# Patient Record
Sex: Female | Born: 1950
Health system: Southern US, Community
[De-identification: ages and names within clinical notes are randomized; demographics above are authoritative.]

## PROBLEM LIST (undated history)

## (undated) DIAGNOSIS — I639 Cerebral infarction, unspecified: Secondary | ICD-10-CM

## (undated) DIAGNOSIS — N289 Disorder of kidney and ureter, unspecified: Secondary | ICD-10-CM

## (undated) DIAGNOSIS — F32A Depression, unspecified: Secondary | ICD-10-CM

## (undated) DIAGNOSIS — I10 Essential (primary) hypertension: Secondary | ICD-10-CM

## (undated) DIAGNOSIS — E785 Hyperlipidemia, unspecified: Secondary | ICD-10-CM

## (undated) DIAGNOSIS — E079 Disorder of thyroid, unspecified: Secondary | ICD-10-CM

## (undated) DIAGNOSIS — F329 Major depressive disorder, single episode, unspecified: Secondary | ICD-10-CM

## (undated) HISTORY — DX: Depression, unspecified: F32.A

## (undated) HISTORY — PX: KNEE ARTHROSCOPY W/ MENISCAL REPAIR: SHX1877

## (undated) HISTORY — DX: Disorder of kidney and ureter, unspecified: N28.9

## (undated) HISTORY — PX: CHOLECYSTECTOMY: SHX55

## (undated) HISTORY — DX: Essential (primary) hypertension: I10

## (undated) HISTORY — DX: Hyperlipidemia, unspecified: E78.5

## (undated) HISTORY — DX: Cerebral infarction, unspecified: I63.9

## (undated) HISTORY — DX: Major depressive disorder, single episode, unspecified: F32.9

## (undated) HISTORY — DX: Disorder of thyroid, unspecified: E07.9

---

## 1996-04-02 DIAGNOSIS — M1990 Unspecified osteoarthritis, unspecified site: Secondary | ICD-10-CM

## 1996-04-02 HISTORY — DX: Unspecified osteoarthritis, unspecified site: M19.90

## 2004-08-28 ENCOUNTER — Other Ambulatory Visit: Payer: Self-pay

## 2004-09-18 ENCOUNTER — Ambulatory Visit: Payer: Self-pay | Admitting: General Practice

## 2005-04-15 ENCOUNTER — Emergency Department: Payer: Self-pay | Admitting: Unknown Physician Specialty

## 2005-04-15 IMAGING — CR CERVICAL SPINE - 2-3 VIEW
1 series · 4 of 4 positions shown · non-contrast
Comparison: none

REASON FOR EXAM: fall / [HOSPITAL]
COMMENTS:  LMP: Post-Menopausal

[Series 1: view not recorded · 0.17mm/px · 4 of 4 slices shown]
[im 1/4]
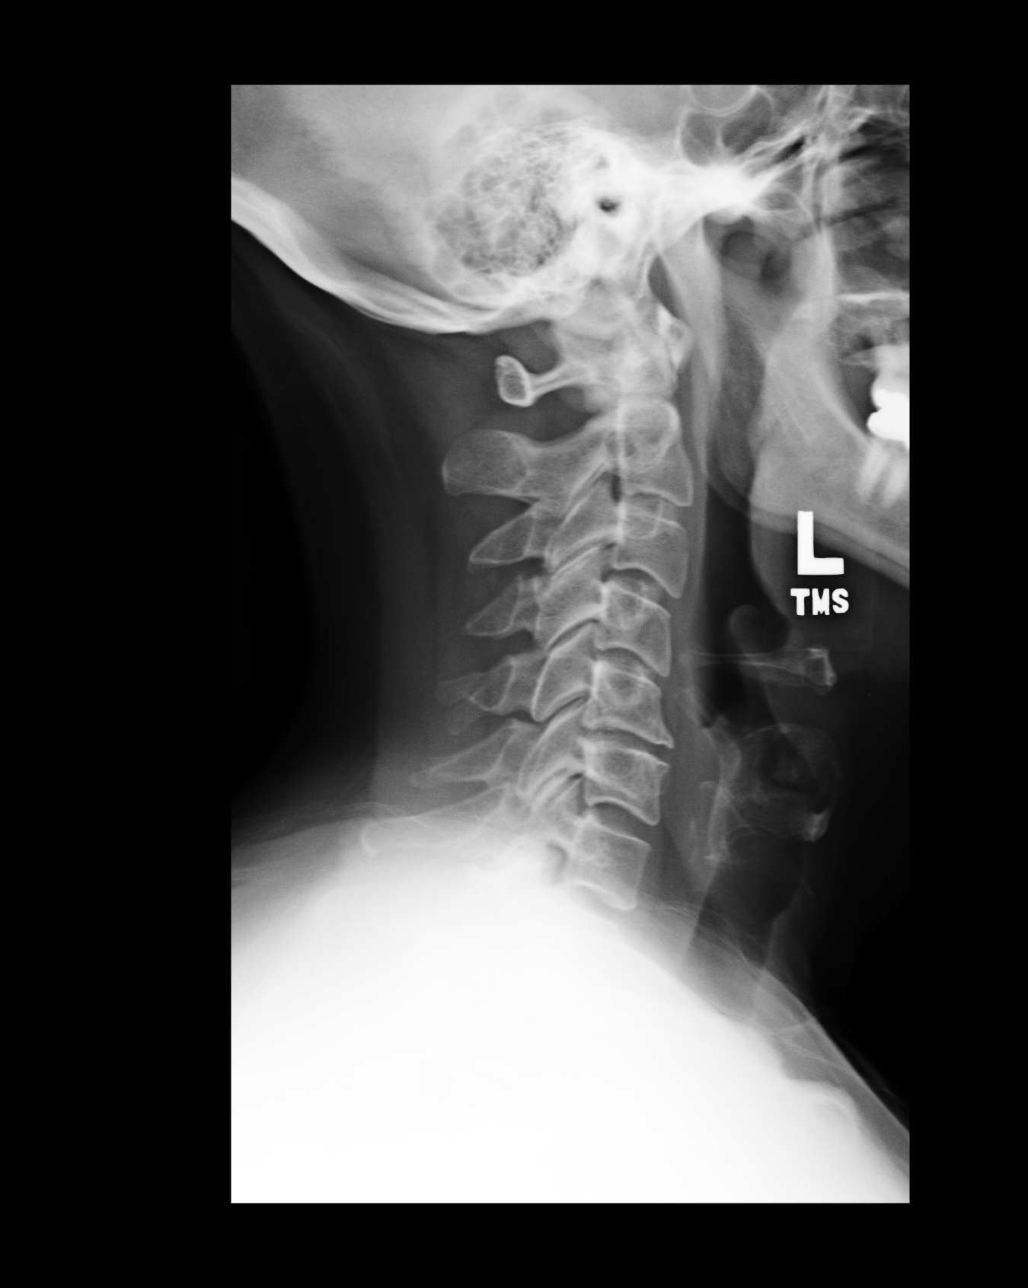
[im 2/4]
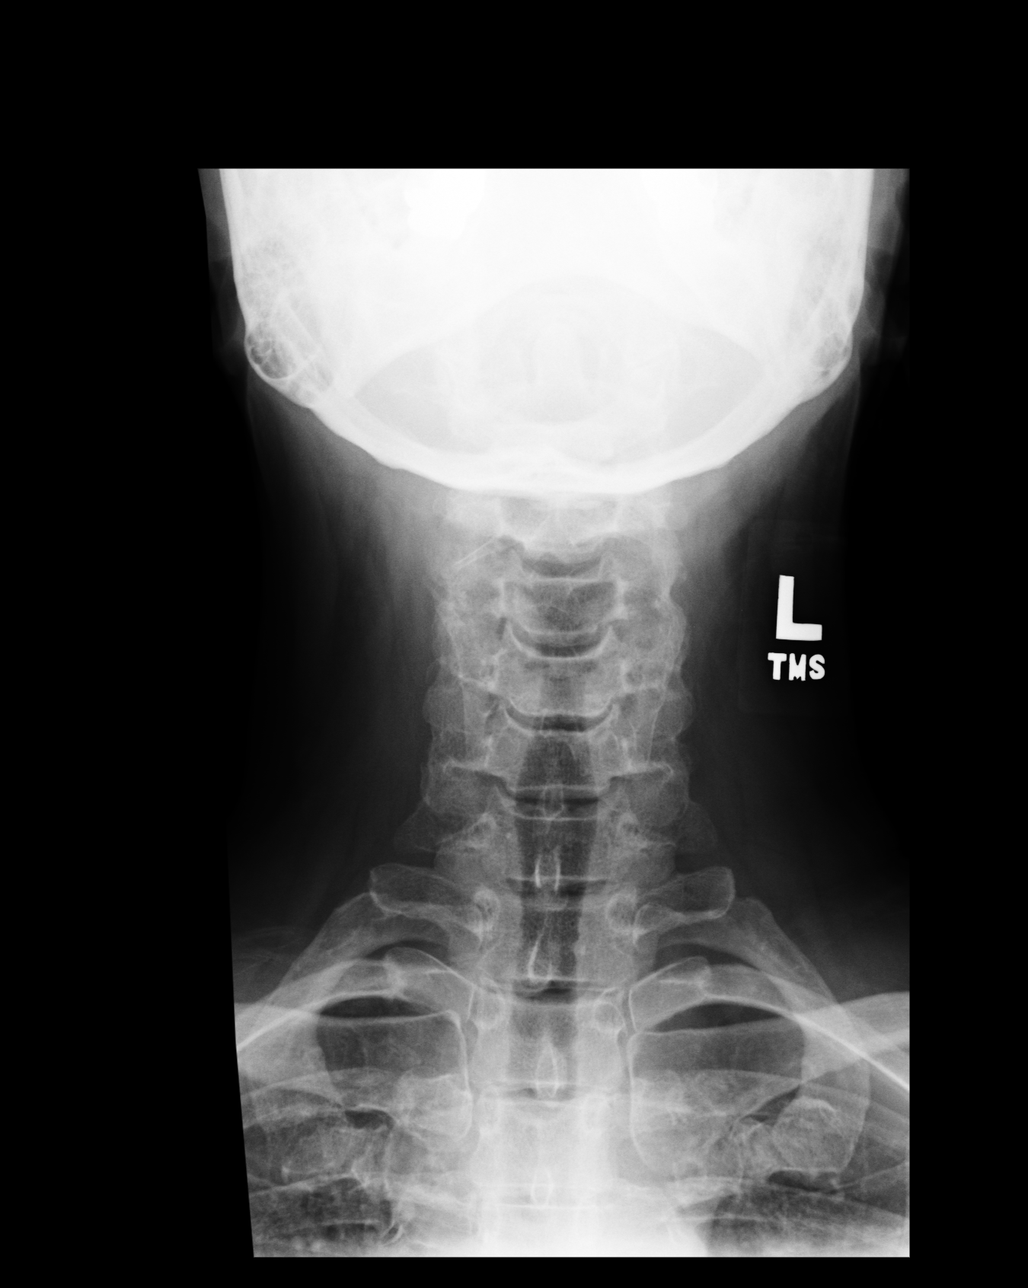
[im 3/4]
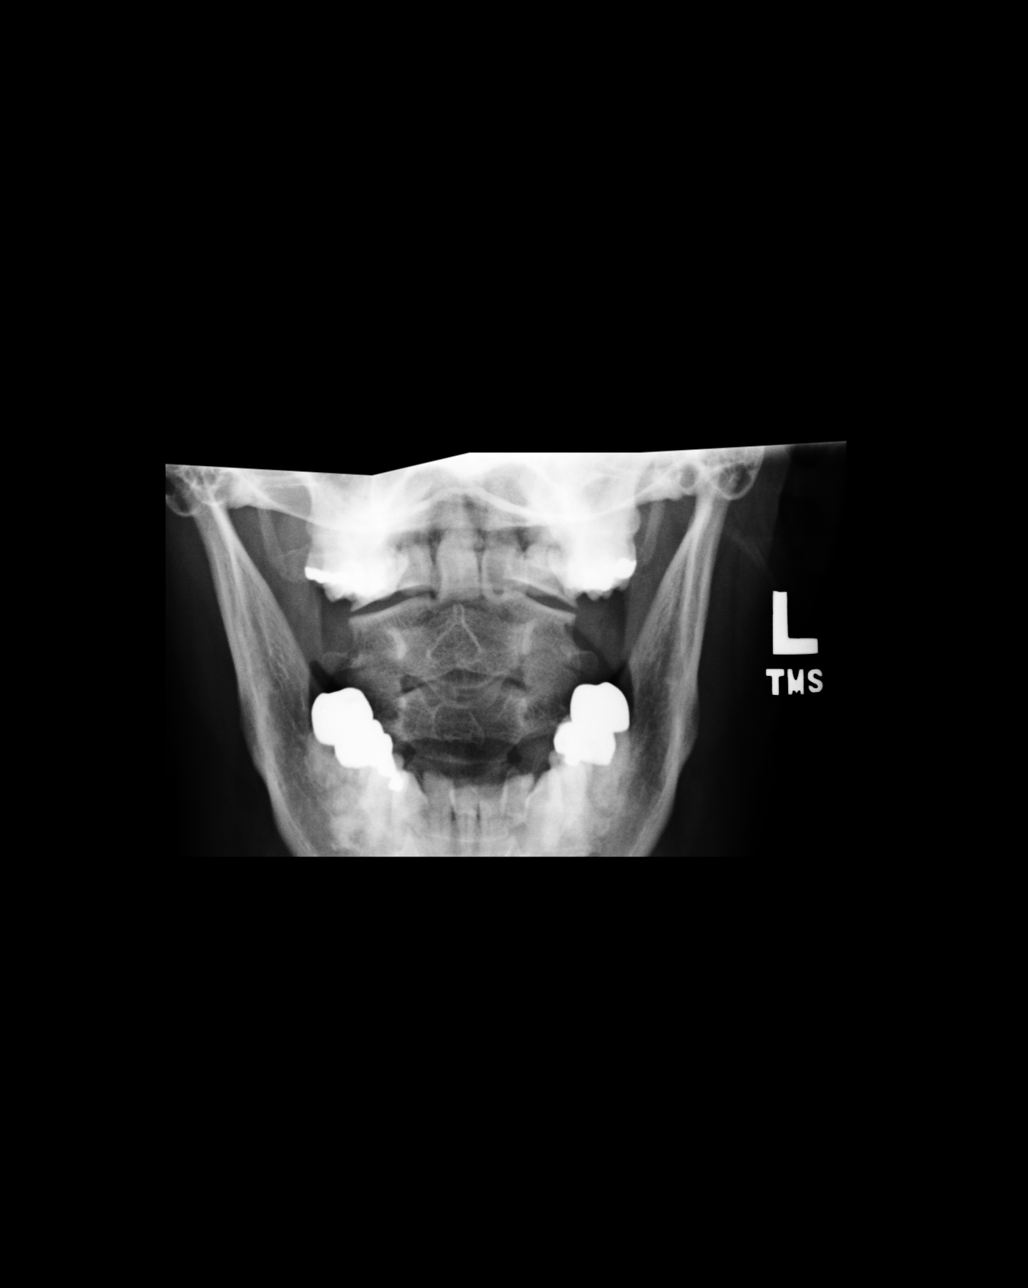
[im 4/4]
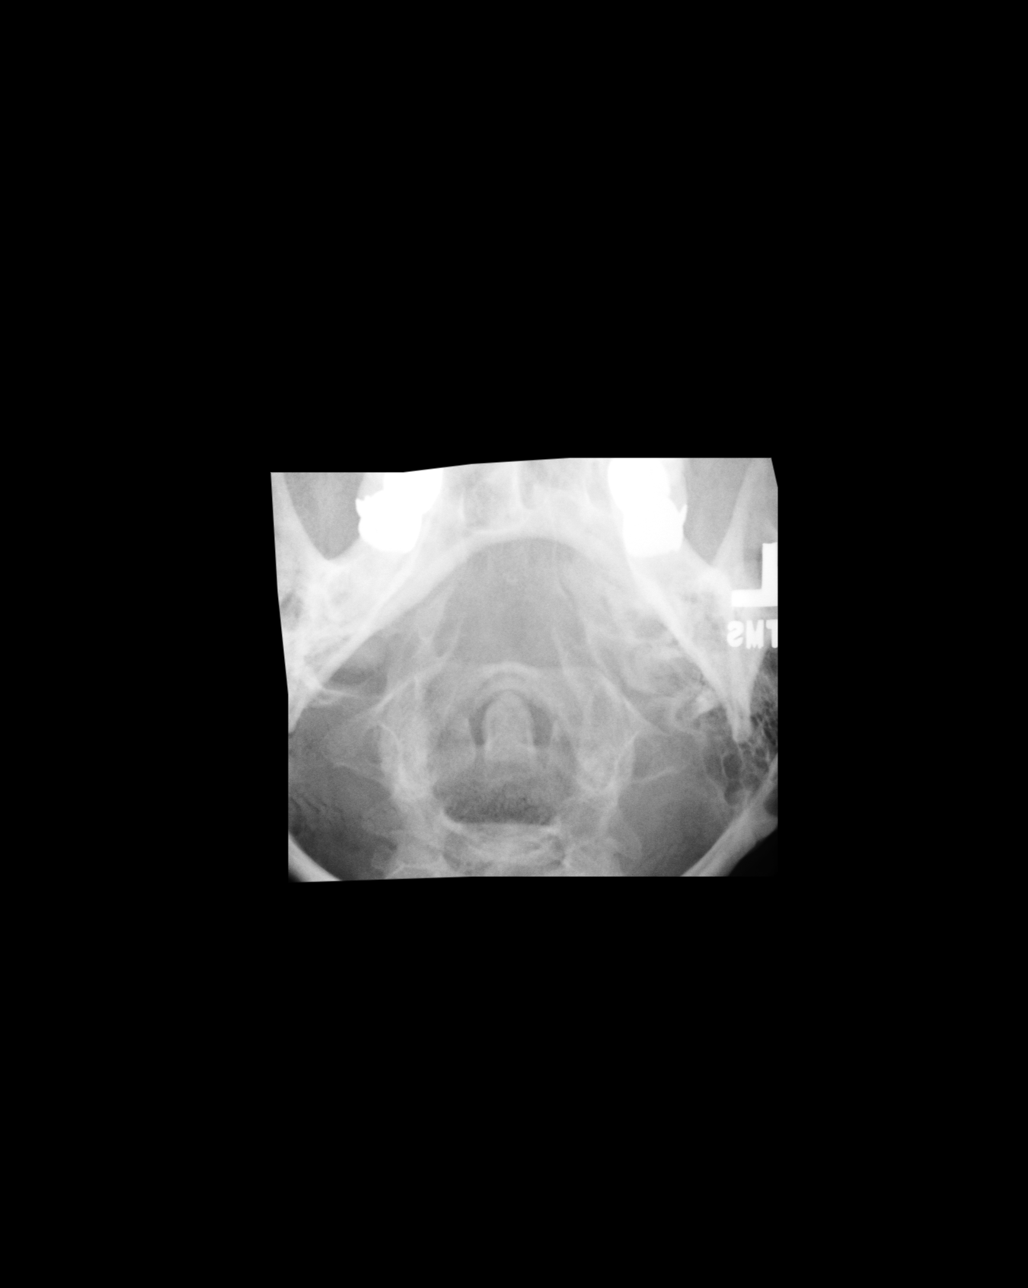

[4 of 4 positions shown; findings below may reference images not displayed]

PROCEDURE:     DXR - DXR C- SPINE AP AND LATERAL  - [DATE] [DATE]

RESULT:     AP, lateral and odontoid views of the cervical spine show the
vertebral body heights to be well maintained. No fracture is seen. The
vertebral body alignment is normal. There is narrowing of C5-6 compatible
with cervical disc disease. Associated anterior spur formation is present at
this level. The odontoid process is intact.
IMPRESSION: 1)No fracture is seen.

2)There are noted changes of cervical disc disease at C5-C6.

## 2005-04-15 IMAGING — CT CT HEAD WITHOUT CONTRAST
2 series · 16 of 30 positions shown, 20 images · non-contrast
Comparison: none

REASON FOR EXAM: trauma to head / [HOSPITAL]
COMMENTS:  LMP: Post-Menopausal

[Series 2: without · axial · non-contrast · 0.41mm/px · z∈[-134,-14]mm · 13 of 30 slices shown, 17 images]
[im 3/30  brain]
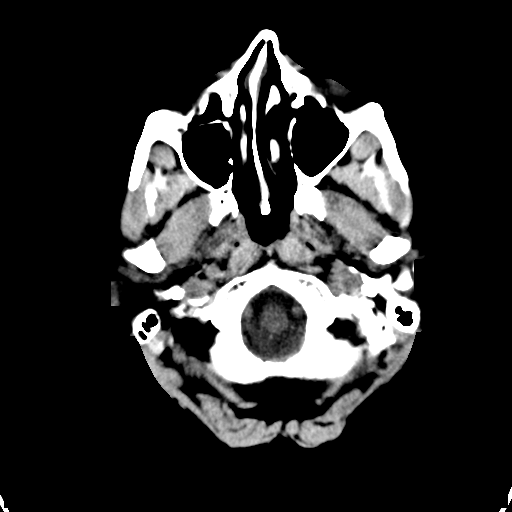
[im 3/30  bone]
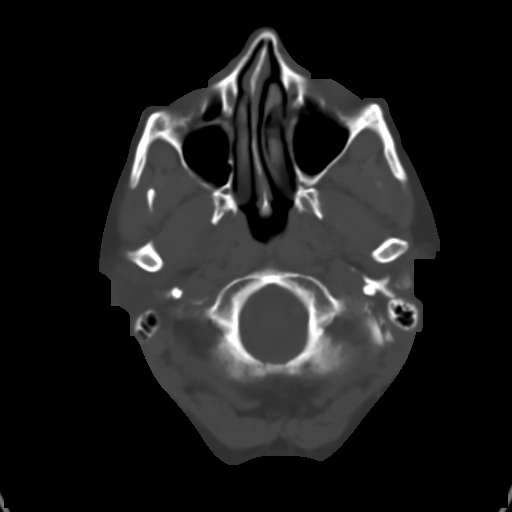
[im 5/30  brain]
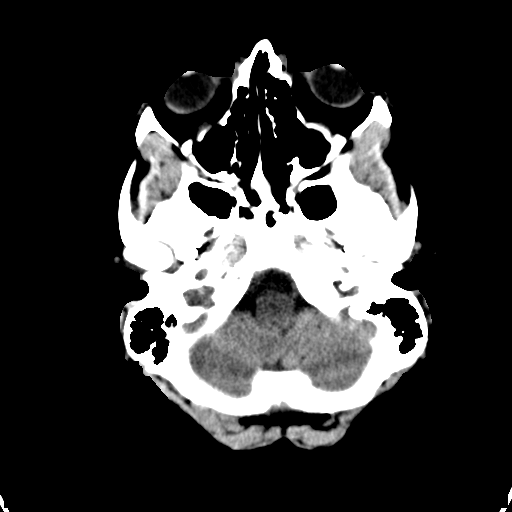
[im 7/30  brain]
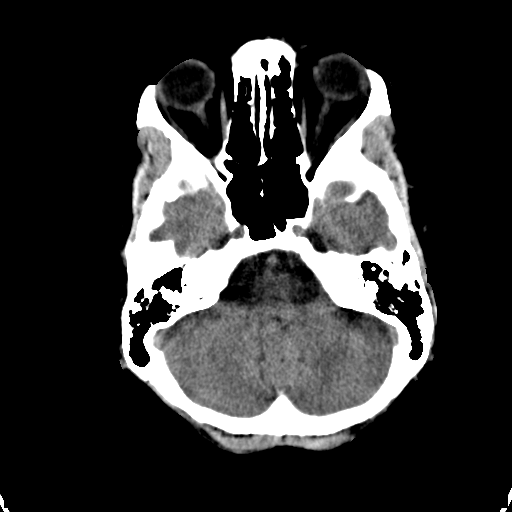
[im 9/30  brain]
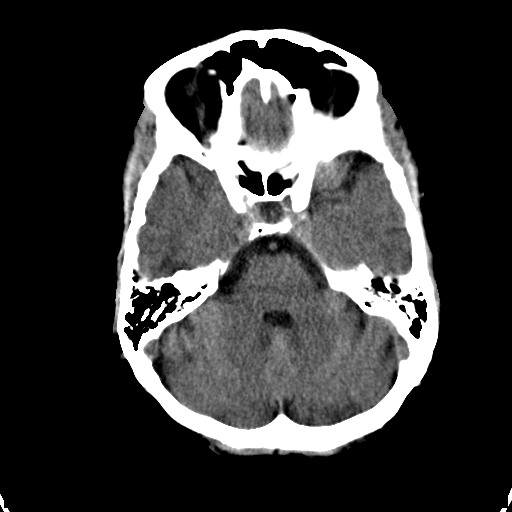
[im 11/30  brain]
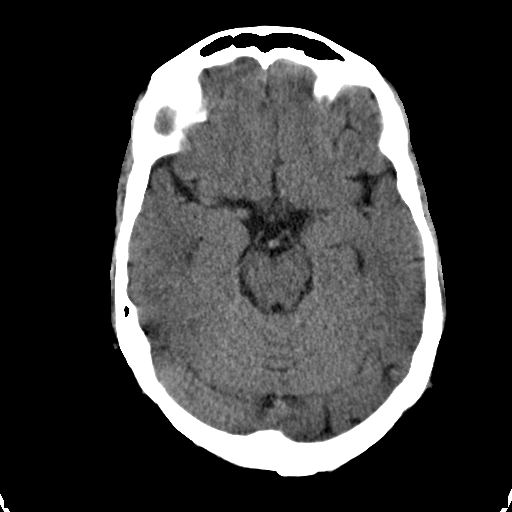
[im 11/30  bone]
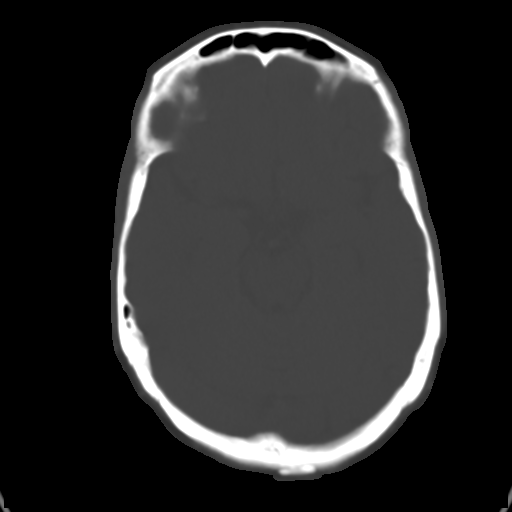
[im 13/30  brain]
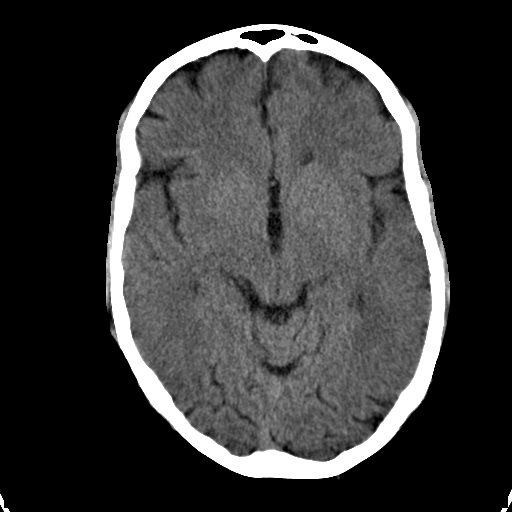
[im 15/30  brain]
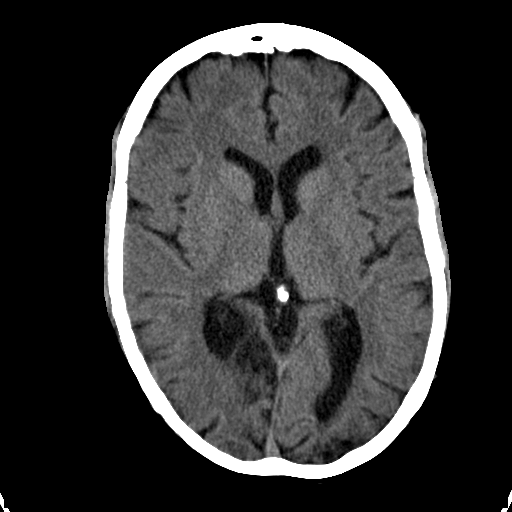
[im 17/30  brain]
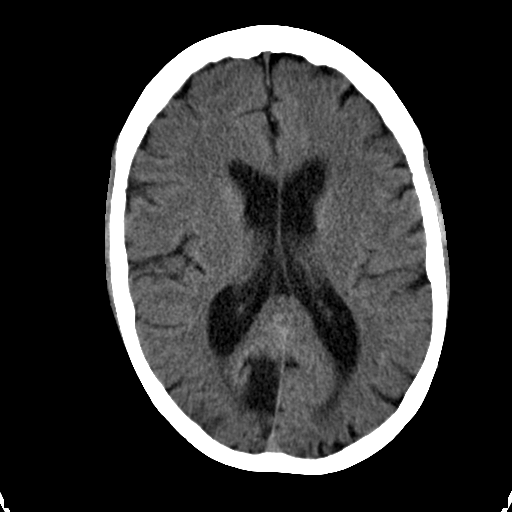
[im 19/30  brain]
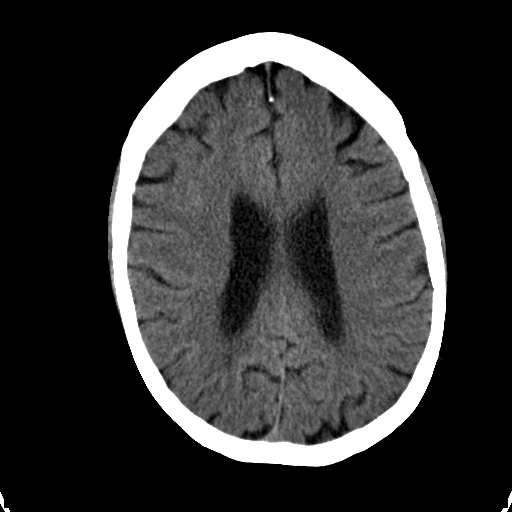
[im 19/30  bone]
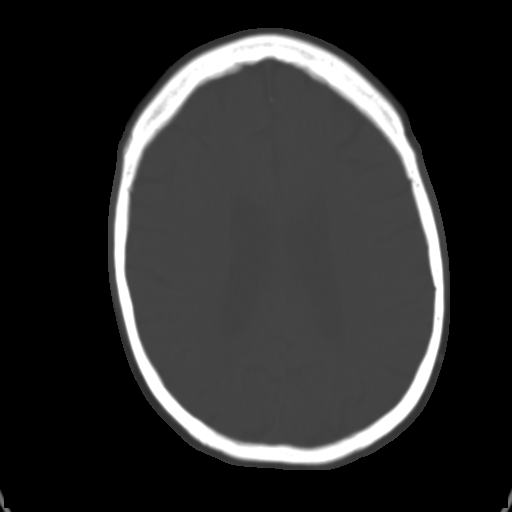
[im 21/30  brain]
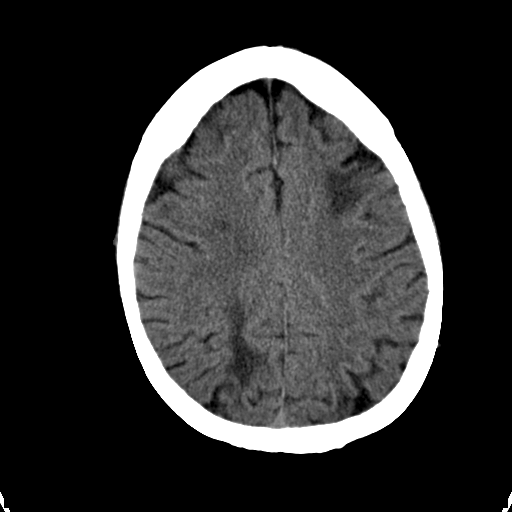
[im 23/30  brain]
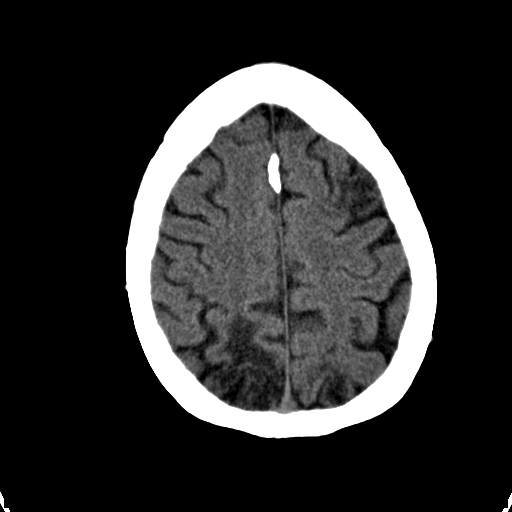
[im 25/30  brain]
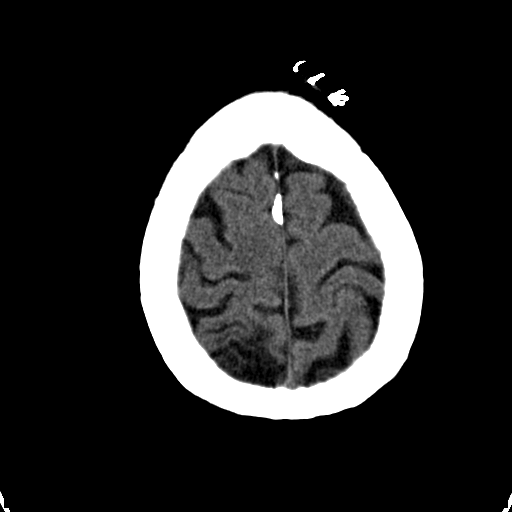
[im 27/30  brain]
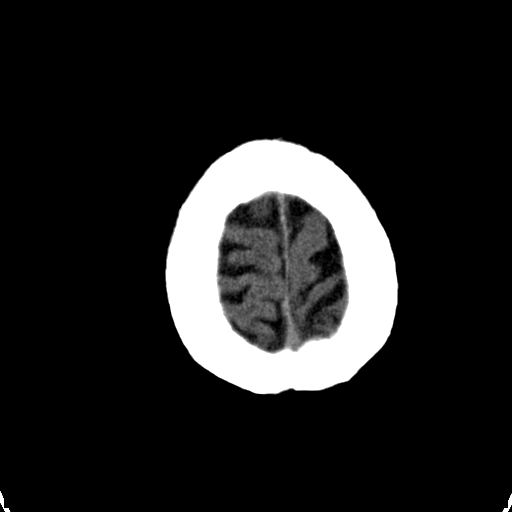
[im 27/30  bone]
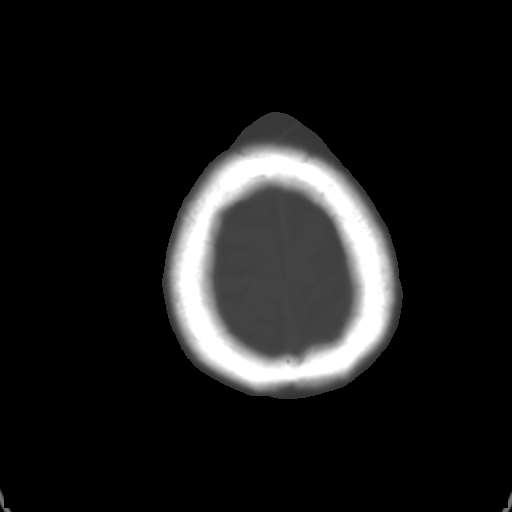

[Series 3: bone windows · axial · 0.41mm/px · z∈[-134,-94]mm · 3 of 30 slices shown]
[im 3/30  bone]
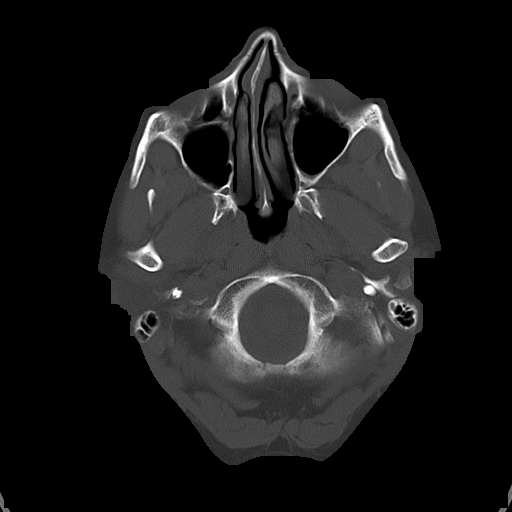
[im 7/30  bone]
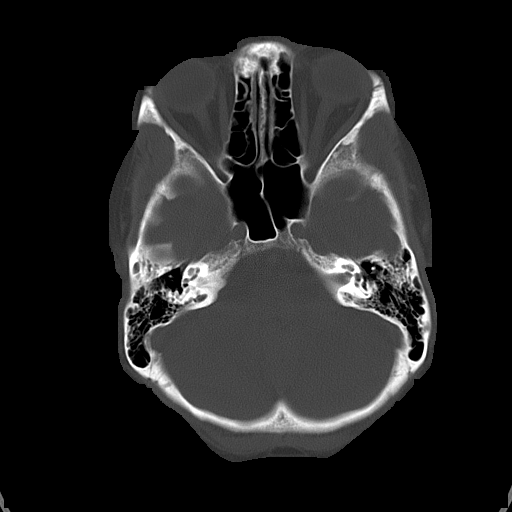
[im 11/30  bone]
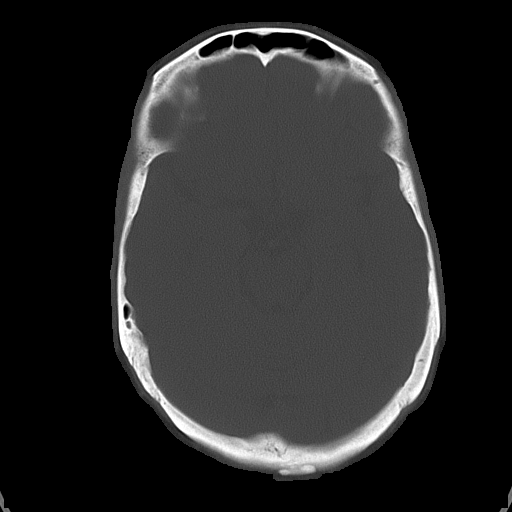

[16 of 30 positions shown; findings below may reference images not displayed]

PROCEDURE:     CT  - CT HEAD WITHOUT CONTRAST  - [DATE] [DATE]

RESULT:     Unenhanced CT scan of the Brain was performed. No evidence of
acute intracranial hemorrhage is identified. There is no midline shift or
mass effect. There is evidence of an old RIGHT occipital lobe infarct with
encephalomalacic change. There is no evidence of skull fracture or paranasal
sinus abnormality. There is evidence of a LEFT frontal scalp laceration.
IMPRESSION: 1)Old RIGHT occipital lobe infarct with encephalomalacic change.

2)No evidence of acute hemorrhage, midline shift, or mass effect.

3)No skull fracture but there is a LEFT frontal scalp laceration.

## 2007-03-06 ENCOUNTER — Ambulatory Visit: Payer: Self-pay | Admitting: Gastroenterology

## 2007-03-23 ENCOUNTER — Emergency Department: Payer: Self-pay | Admitting: Emergency Medicine

## 2007-03-23 IMAGING — CR LEFT WRIST - COMPLETE 3+ VIEW
1 series · 4 of 4 positions shown · non-contrast
Comparison: none

REASON FOR EXAM: fell c/o left wrist min 4
COMMENTS:

PROCEDURE:     DXR - DXR WRIST LT COMP WITH OBLIQUES  - [DATE]  [DATE]
RESULT:     Four views of the LEFT wrist demonstrate linear lucency in the
distal radius consistent with a non-displaced fracture. The carpus appears
intact.

[Series 1: view not recorded · 0.17mm/px · 4 of 4 slices shown]
[im 1/4]
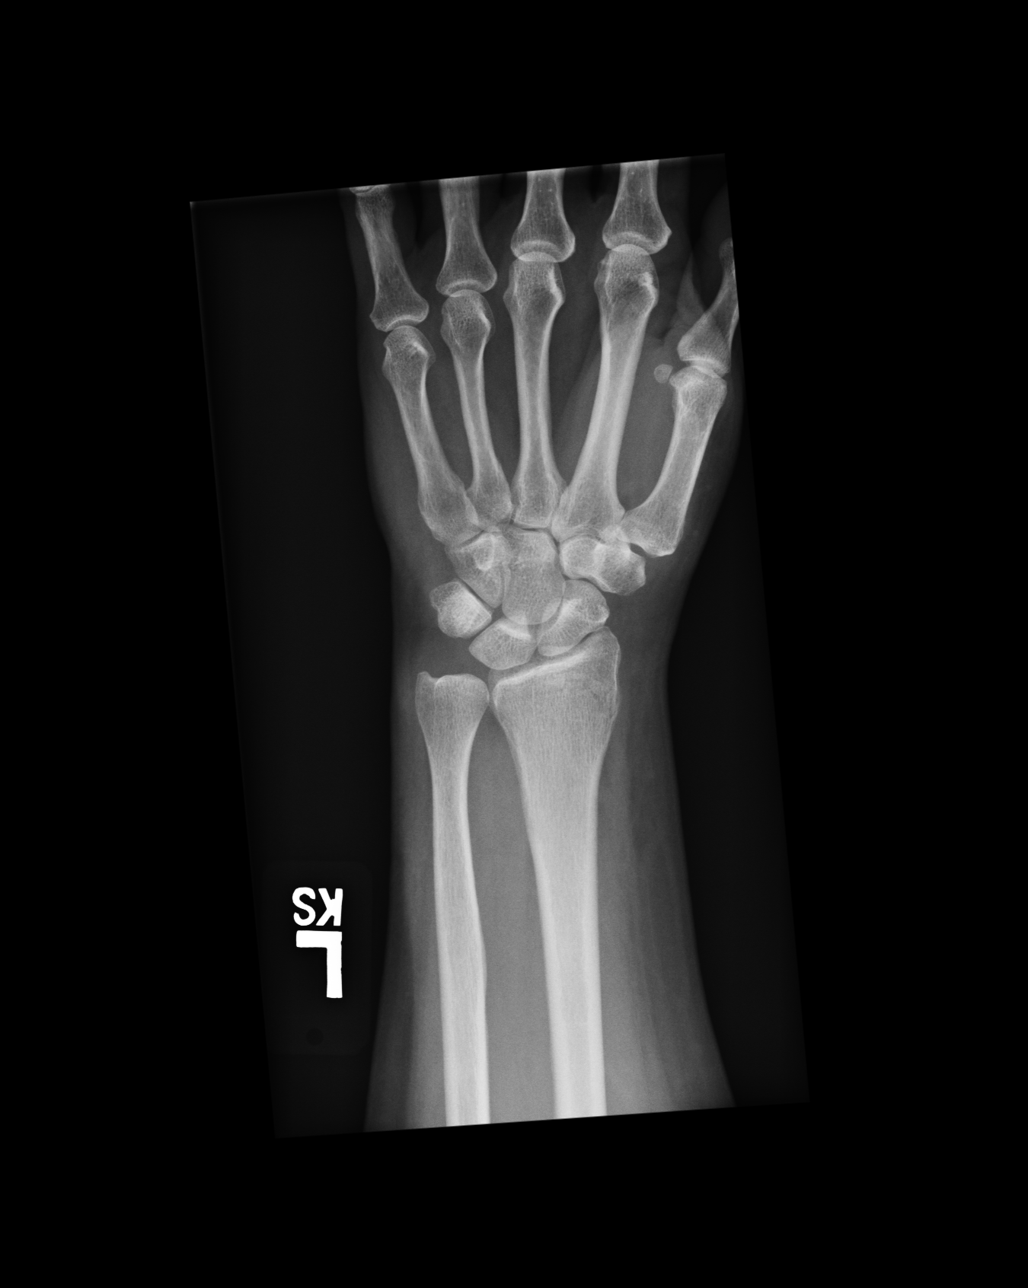
[im 2/4]
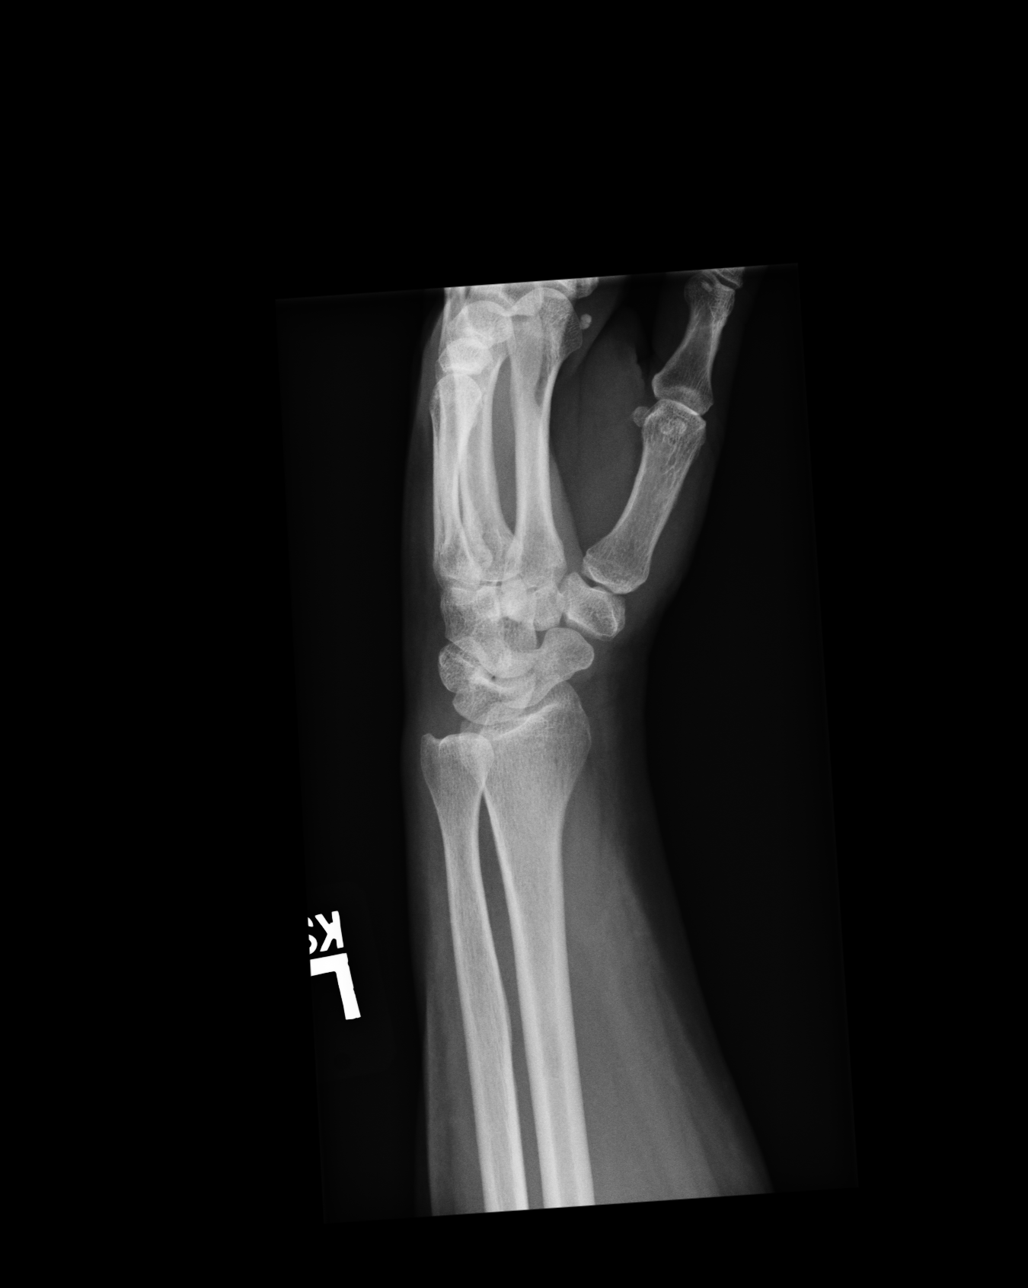
[im 3/4]
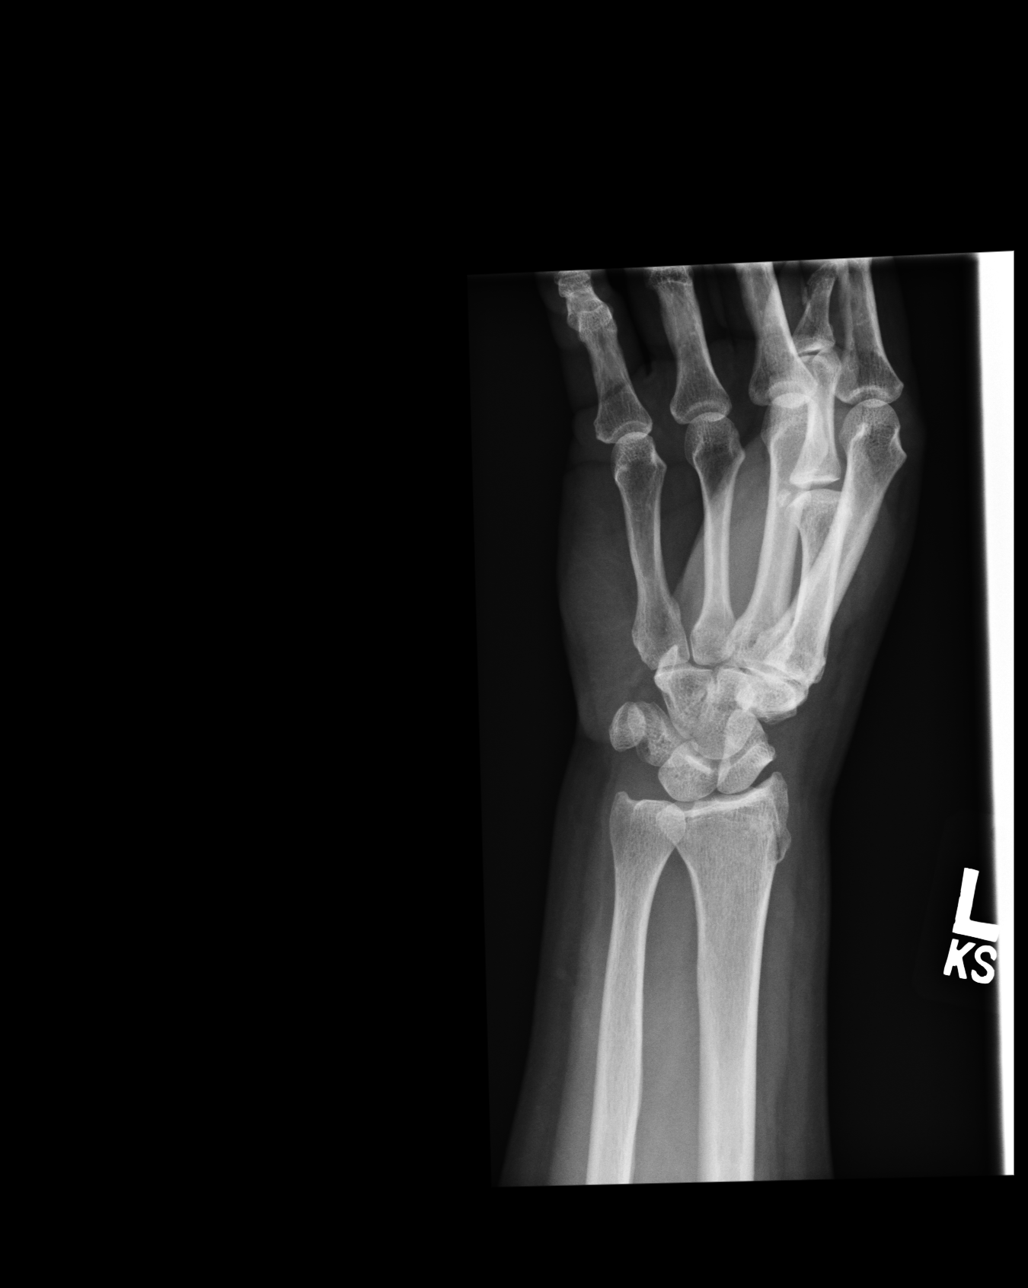
[im 4/4]
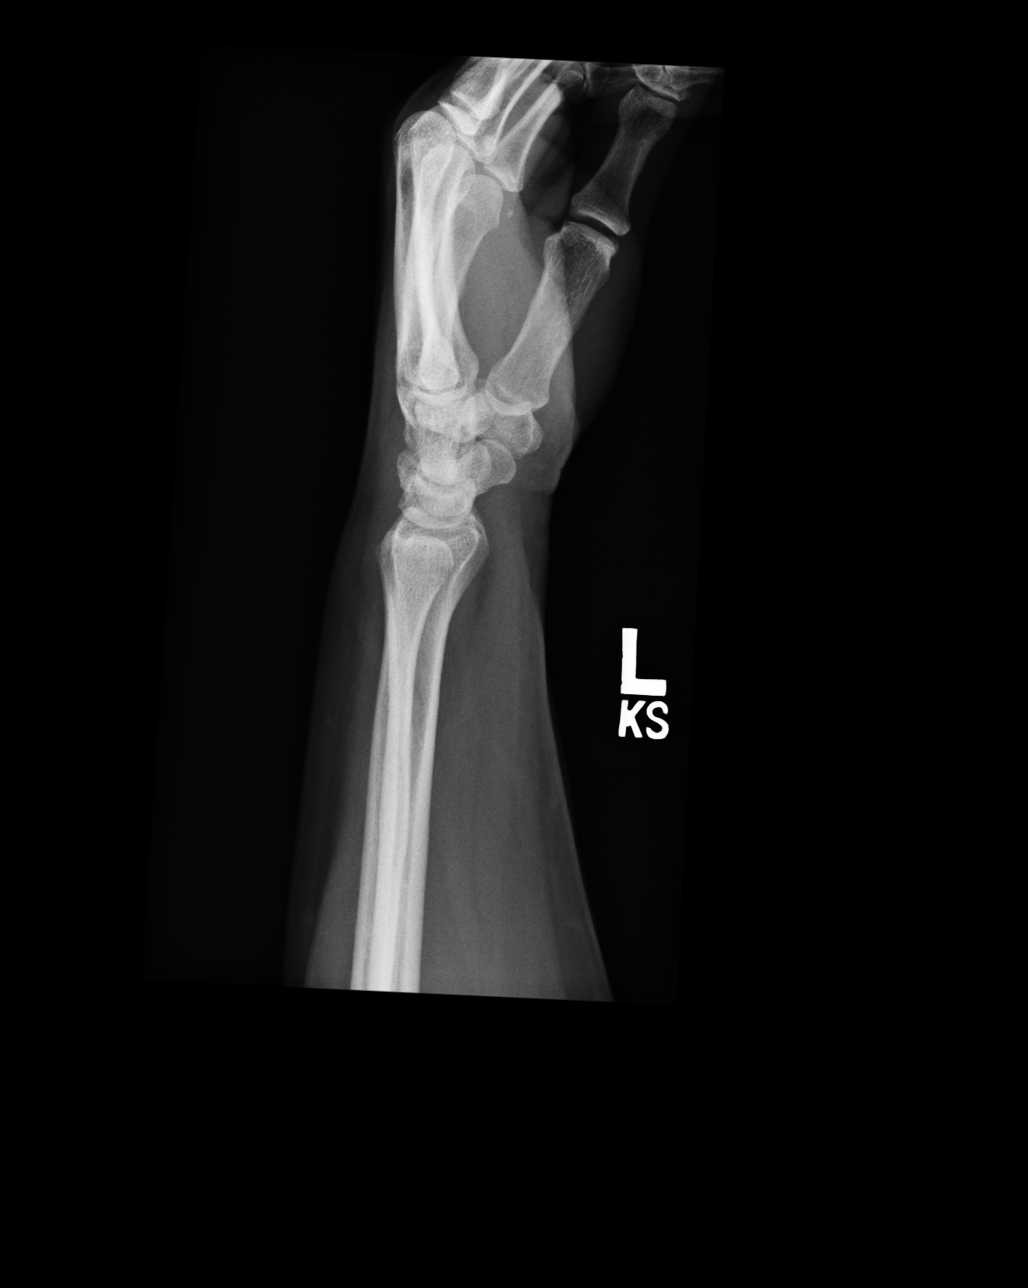

[4 of 4 positions shown; findings below may reference images not displayed]

IMPRESSION: 1.Non-displaced fracture in the distal LEFT radius.

## 2007-07-17 ENCOUNTER — Ambulatory Visit: Payer: Self-pay

## 2008-11-22 ENCOUNTER — Ambulatory Visit: Payer: Self-pay

## 2008-11-24 ENCOUNTER — Ambulatory Visit: Payer: Self-pay

## 2010-03-06 ENCOUNTER — Ambulatory Visit: Payer: Self-pay

## 2010-07-18 ENCOUNTER — Emergency Department: Payer: Self-pay | Admitting: Unknown Physician Specialty

## 2010-07-18 IMAGING — CR RIGHT FOOT COMPLETE - 3+ VIEW
1 series · 3 of 3 positions shown · non-contrast
Comparison: none

REASON FOR EXAM: injury -- ed waiting room
COMMENTS:

PROCEDURE:     DXR - DXR FOOT RT COMPLETE W/OBLIQUES  - [DATE] [DATE]
RESULT:     There is a minimally displaced comminuted fracture at the base
of the fifth metatarsal. No other fractures are seen.  In the lateral view,
there is incidentally noted a plantar calcaneal spur.

[Series 1: view not recorded · 0.17mm/px · 3 of 3 slices shown]
[im 1/3]
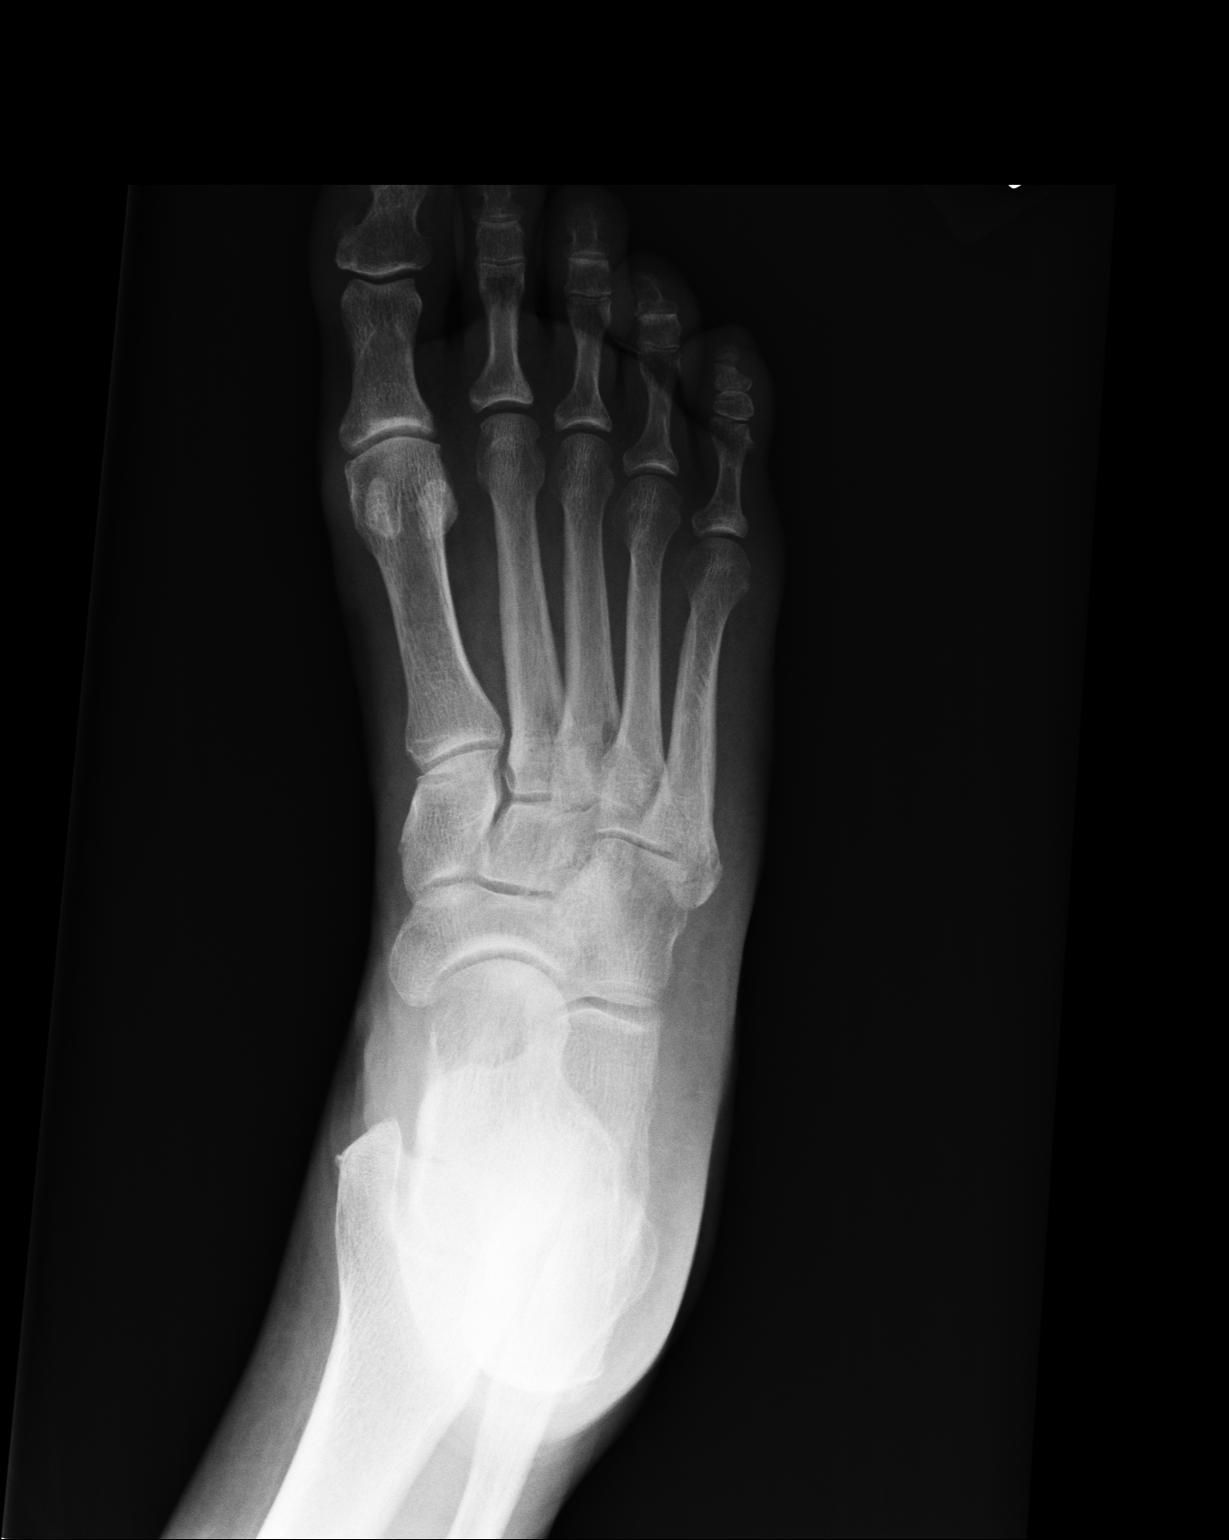
[im 2/3]
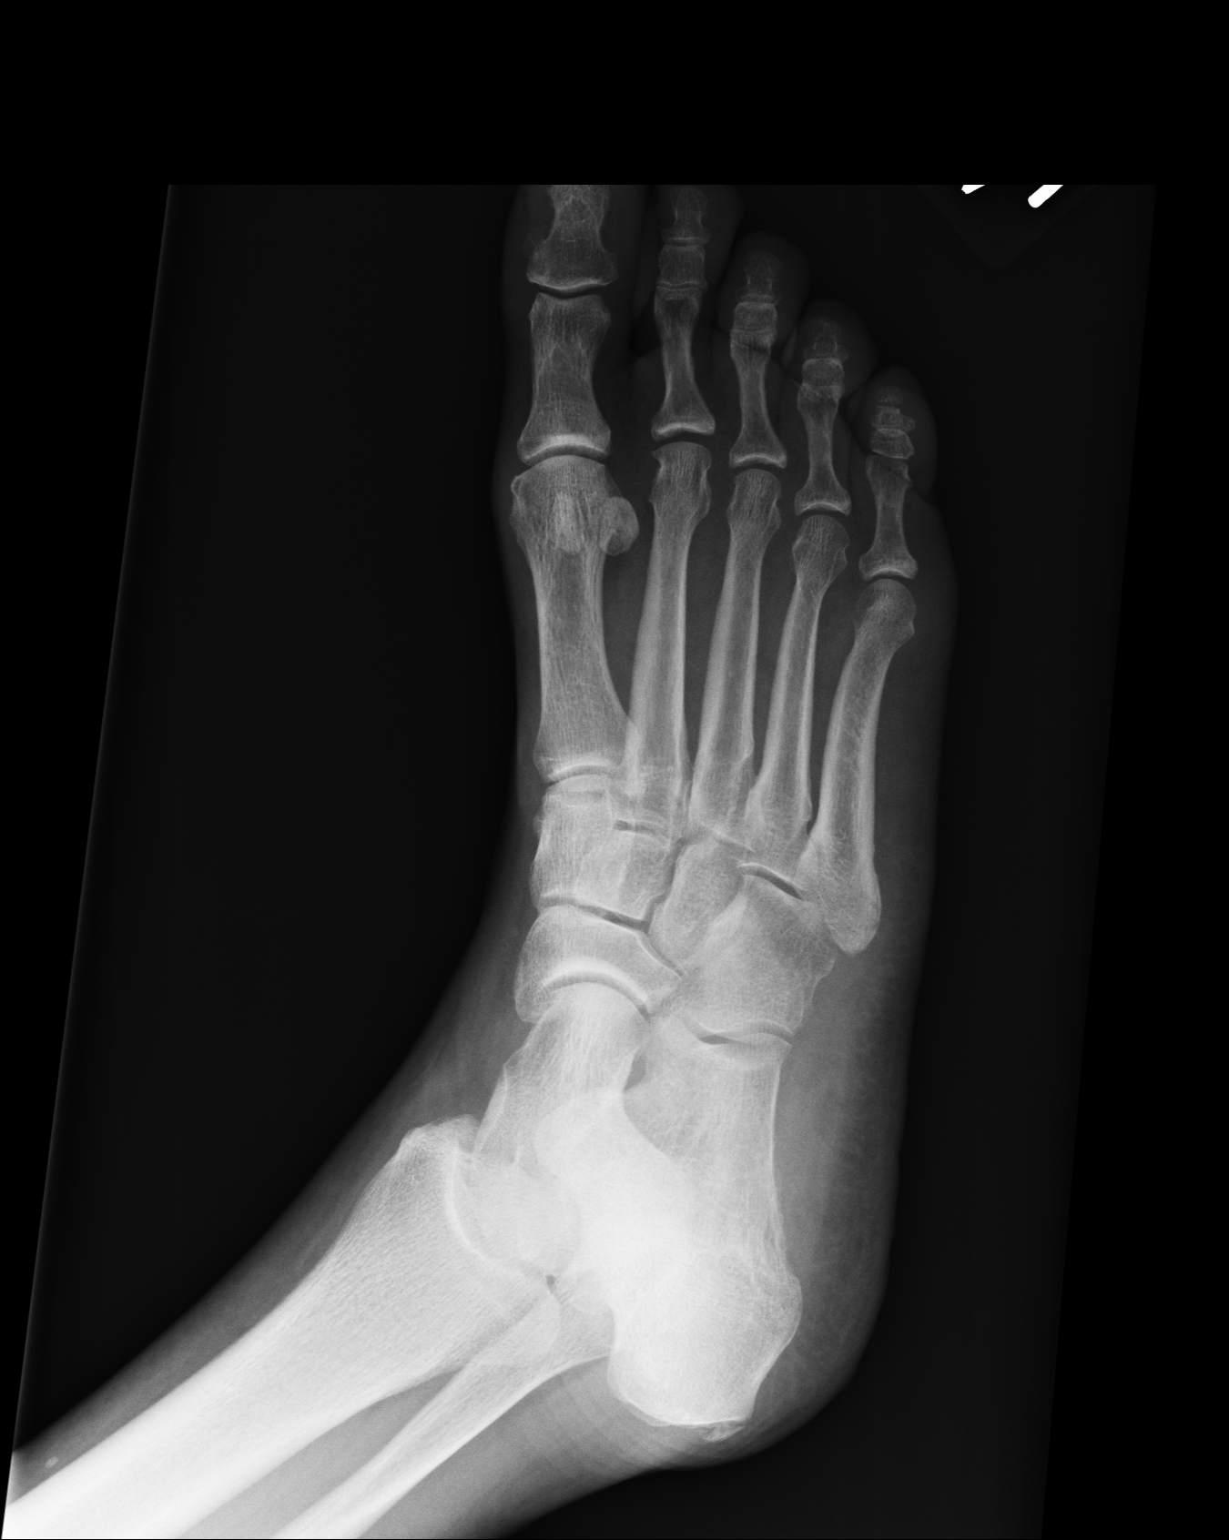
[im 3/3]
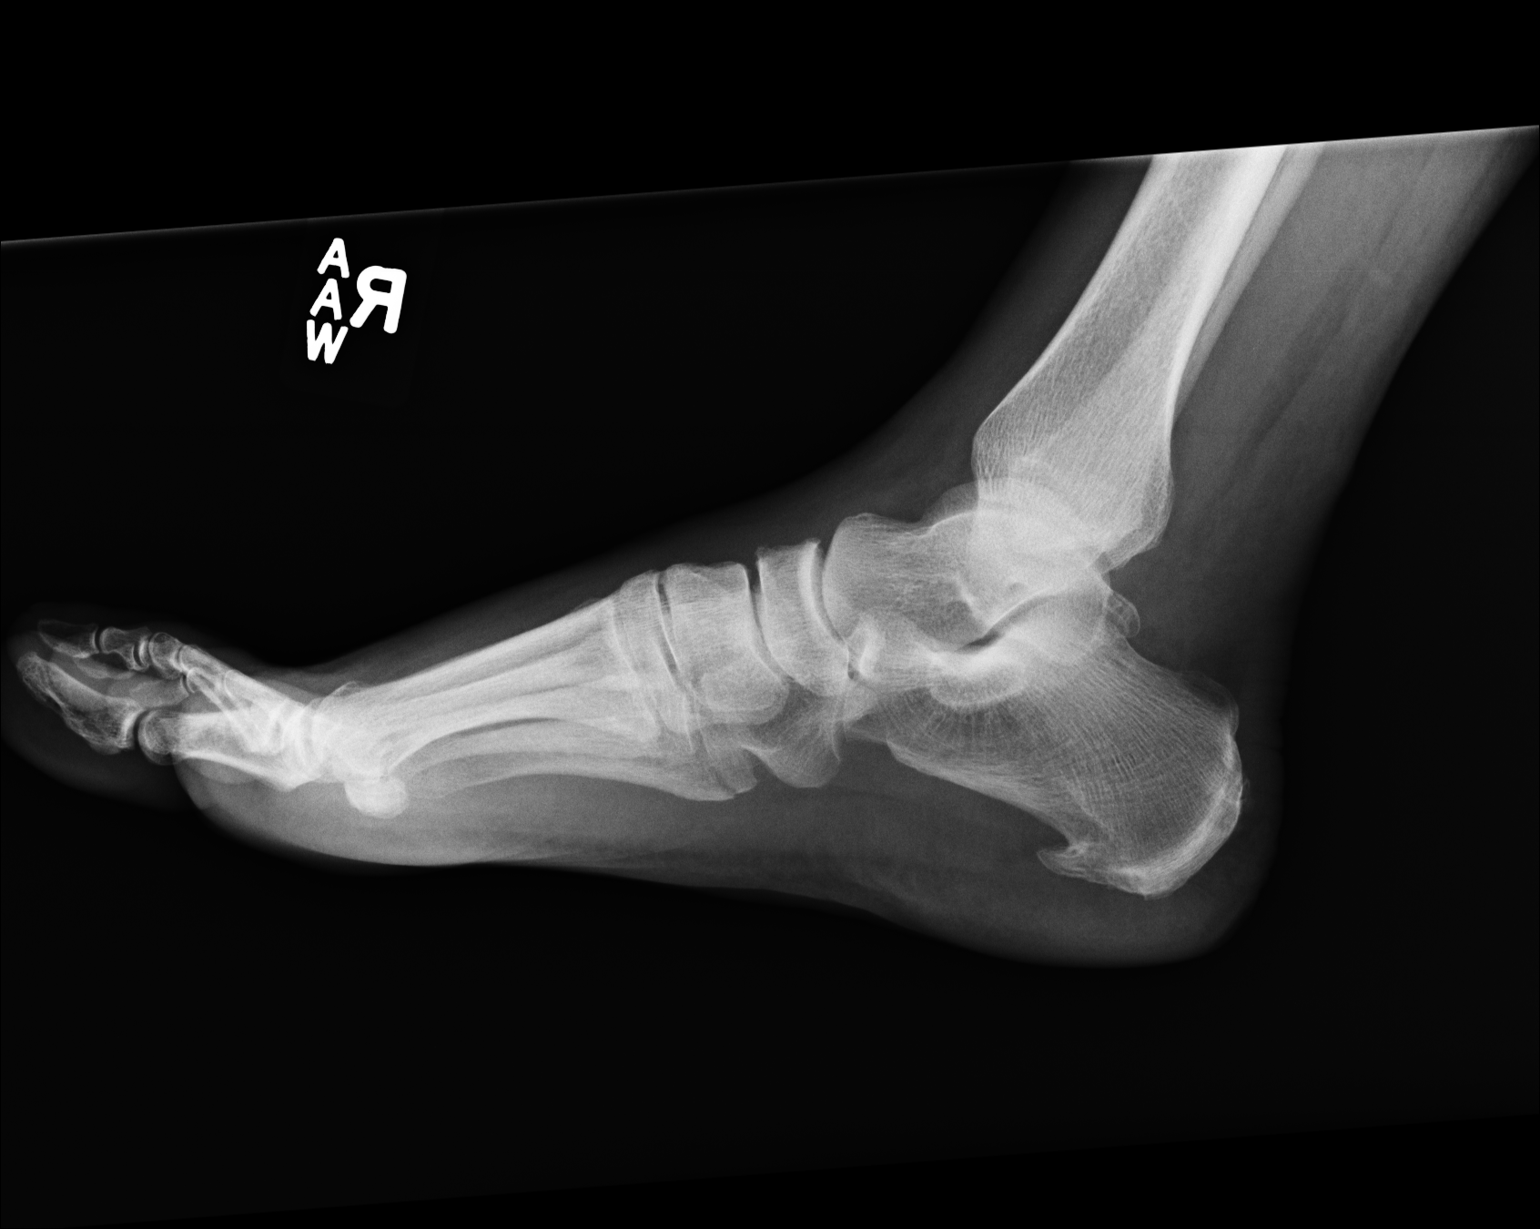

[3 of 3 positions shown; findings below may reference images not displayed]

IMPRESSION: 1. Fracture at the base of the fifth metatarsal, minimally displaced.
2. A plantar calcaneal spur is noted.

## 2010-12-03 HISTORY — PX: COLONOSCOPY: SHX174

## 2010-12-17 ENCOUNTER — Emergency Department: Payer: Self-pay | Admitting: Emergency Medicine

## 2010-12-17 IMAGING — CR LEFT WRIST - COMPLETE 3+ VIEW
1 series · 4 of 4 positions shown · non-contrast
Comparison: none

REASON FOR EXAM: pain
COMMENTS:

[Series 1: view not recorded · 0.17mm/px · 4 of 4 slices shown]
[im 1/4]
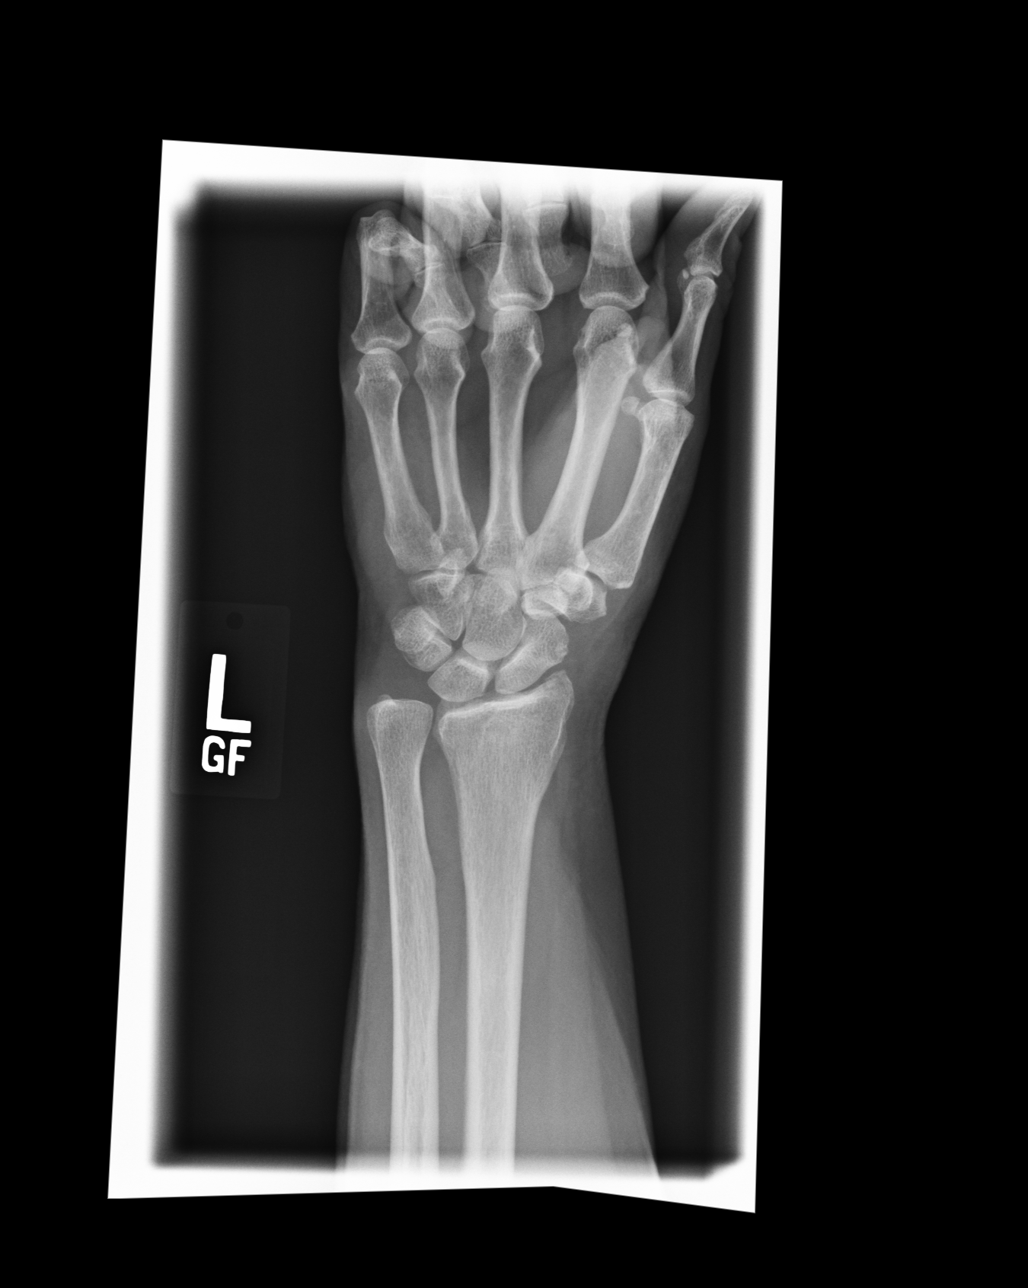
[im 2/4]
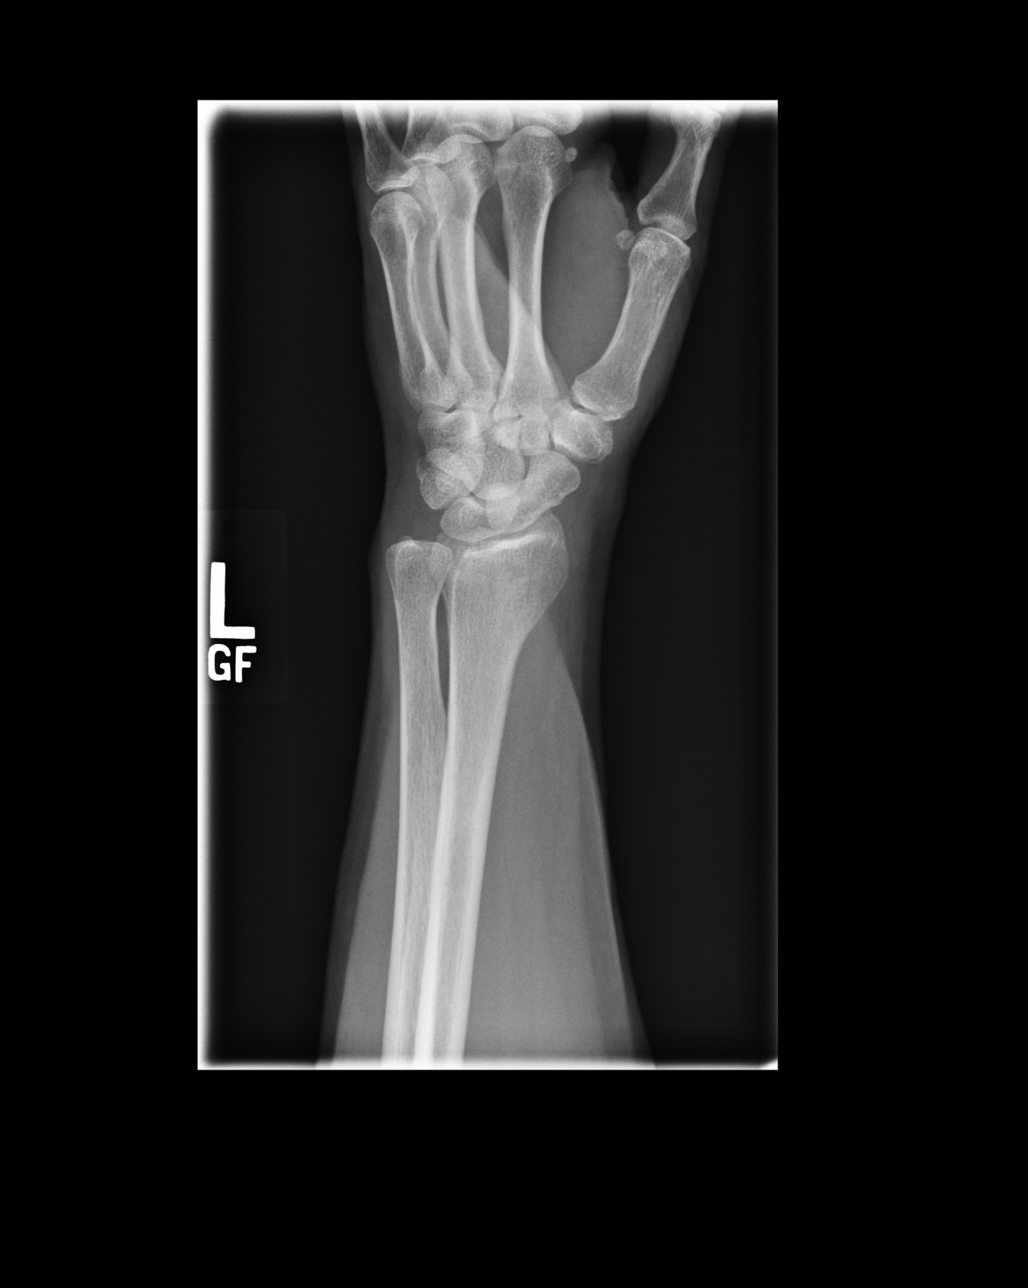
[im 3/4]
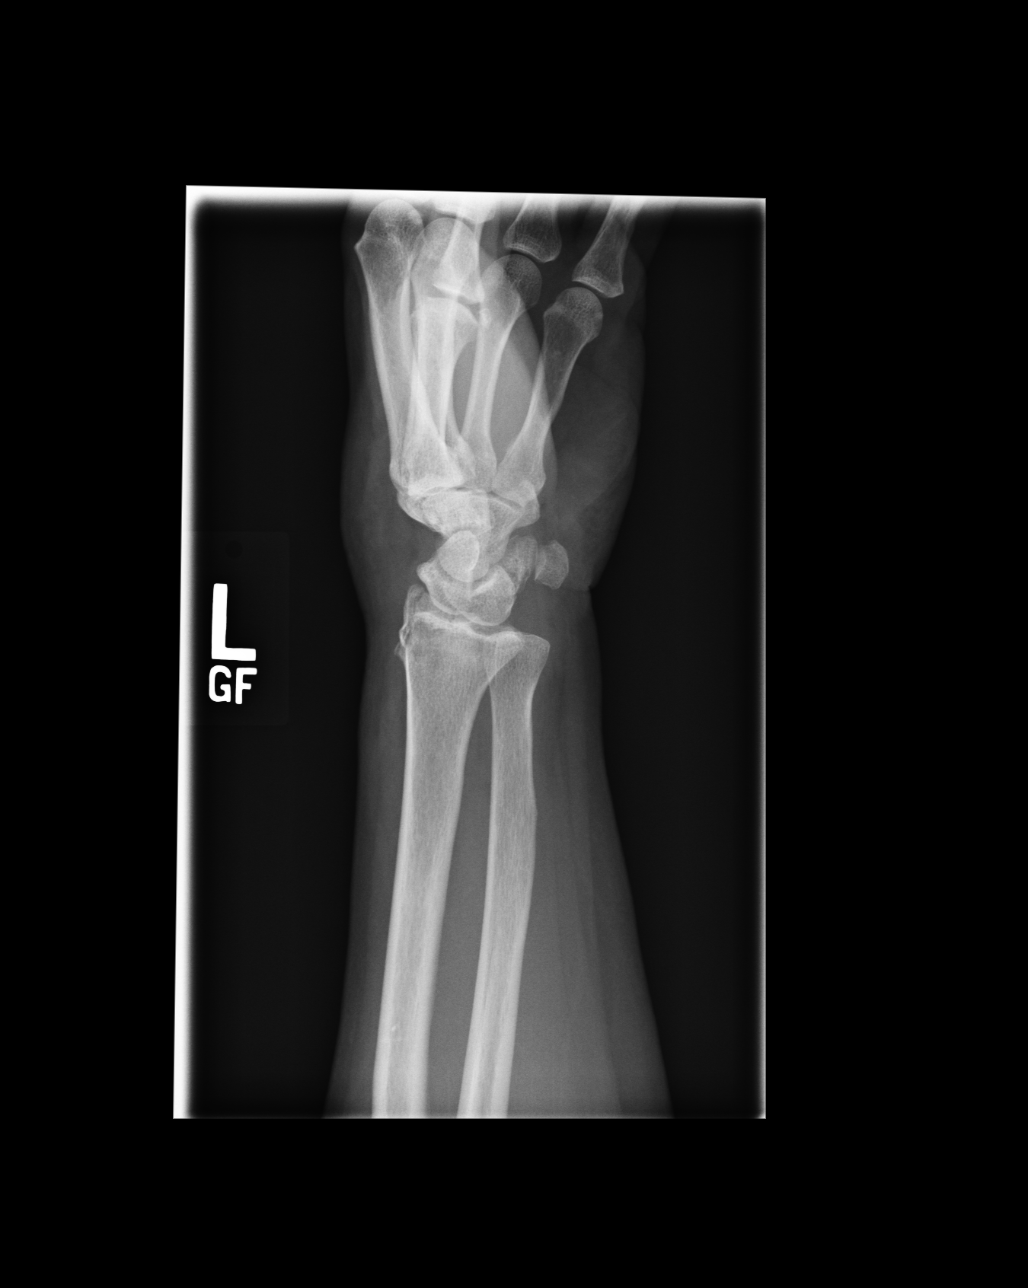
[im 4/4]
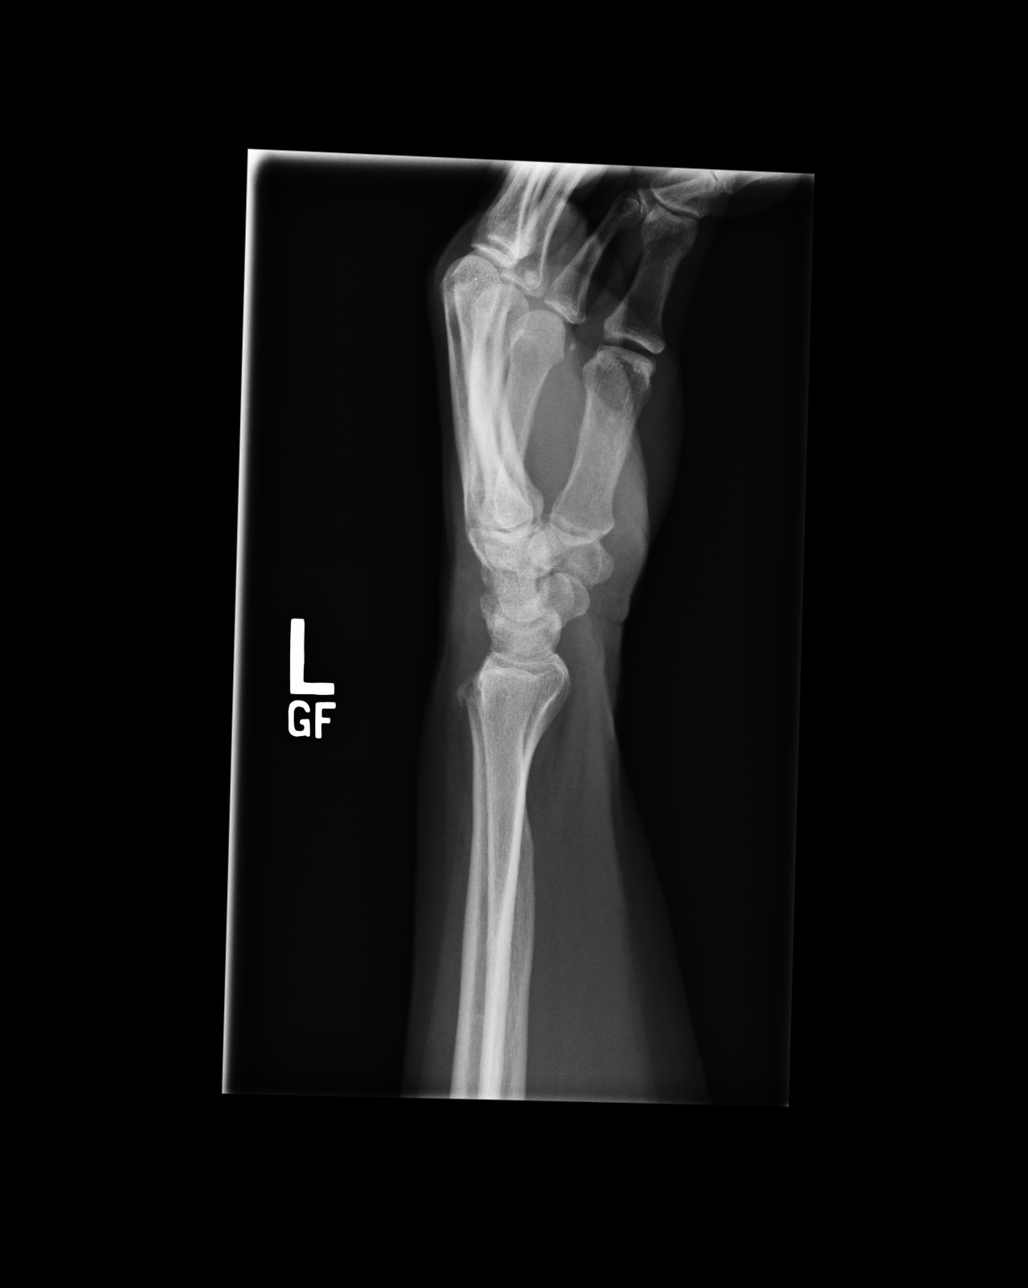

[4 of 4 positions shown; findings below may reference images not displayed]

PROCEDURE:     DXR - DXR WRIST LT COMP WITH OBLIQUES  - [DATE]  [DATE]

RESULT:     Four views of the left wrist are submitted. The patient has a
history of prior fracture of the distal radius. There is a nondisplaced
acute refracture involving the lateral aspect of the radius in the region of
the styloid. The adjacent ulna is grossly intact. I see no acute carpal bone
fracture.
IMPRESSION: The findings are consistent with an acute fracture of the
lateral aspect of the distal radius in the region of the styloid. No
definite carpal bone fracture is identified. The metacarpals are intact
where visualized.

## 2011-03-27 IMAGING — CR DG RIBS 2V*L*
1 series · 3 of 3 positions shown · non-contrast
Comparison: none

REASON FOR EXAM: injury  -  ed waiting room
COMMENTS:

PROCEDURE:     DXR - DXR RIBS LEFT UNILATERAL  - [DATE] [DATE]
RESULT:     No fracture or other acute bony abnormality is identified.

[Series 1: view not recorded · 0.17mm/px · 3 of 3 slices shown]
[im 1/3]
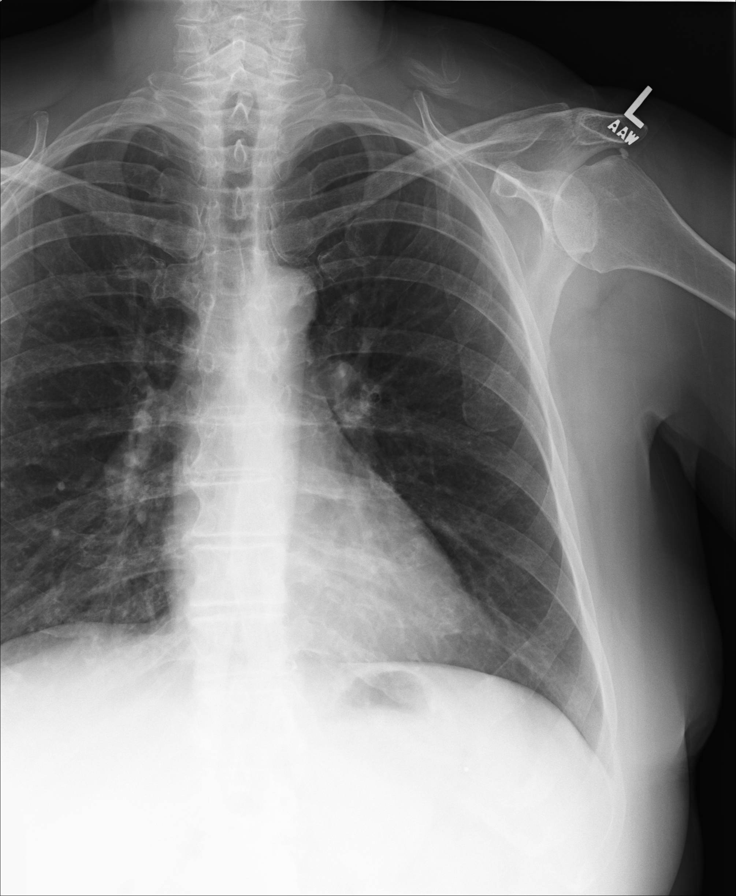
[im 2/3]
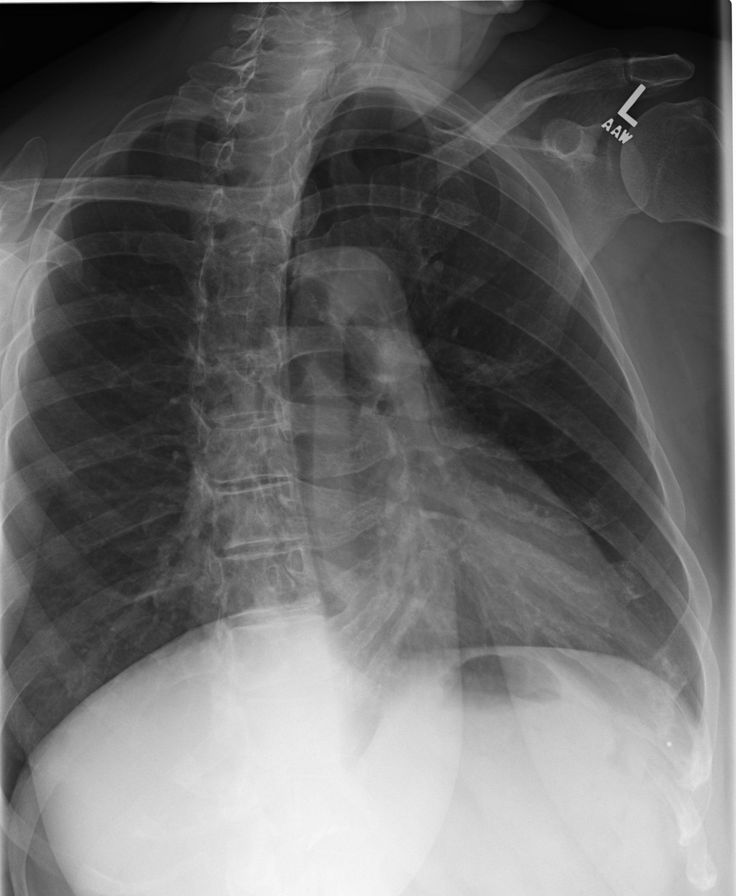
[im 3/3]
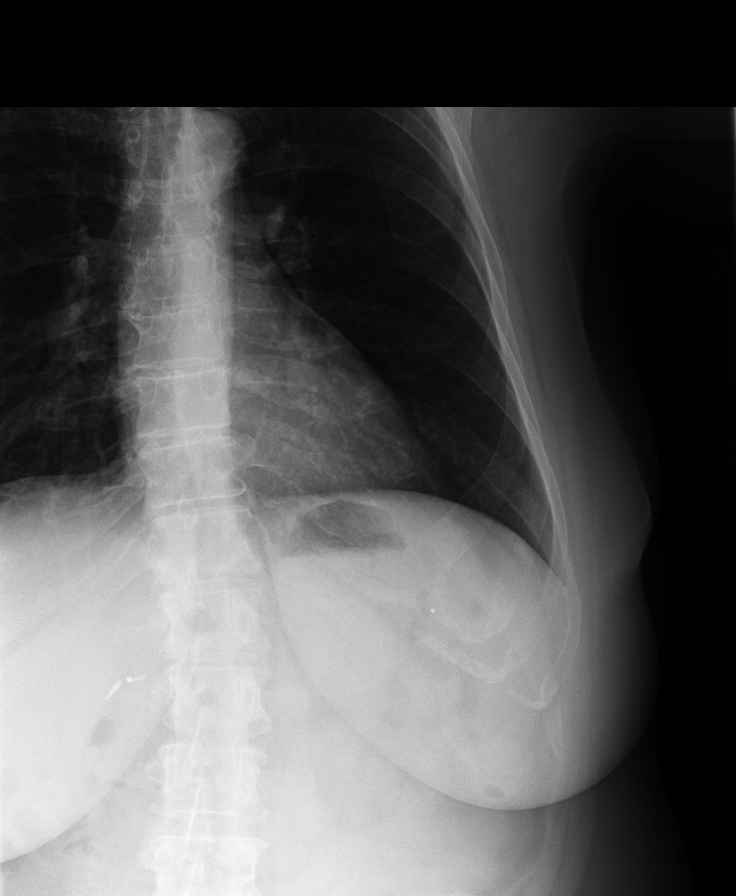

[3 of 3 positions shown; findings below may reference images not displayed]

IMPRESSION: No significant abnormalities are noted.

## 2011-09-26 ENCOUNTER — Encounter: Payer: Self-pay | Admitting: Nurse Practitioner

## 2011-09-26 ENCOUNTER — Encounter: Payer: Self-pay | Admitting: Cardiothoracic Surgery

## 2011-10-04 ENCOUNTER — Encounter: Payer: Self-pay | Admitting: Nurse Practitioner

## 2011-10-08 ENCOUNTER — Ambulatory Visit: Payer: Self-pay

## 2011-10-09 ENCOUNTER — Encounter: Payer: Self-pay | Admitting: Cardiothoracic Surgery

## 2011-11-03 ENCOUNTER — Encounter: Payer: Self-pay | Admitting: Cardiothoracic Surgery

## 2011-11-03 ENCOUNTER — Encounter: Payer: Self-pay | Admitting: Nurse Practitioner

## 2011-12-11 DIAGNOSIS — E785 Hyperlipidemia, unspecified: Secondary | ICD-10-CM | POA: Diagnosis not present

## 2011-12-11 DIAGNOSIS — Z7901 Long term (current) use of anticoagulants: Secondary | ICD-10-CM | POA: Diagnosis not present

## 2011-12-11 DIAGNOSIS — F329 Major depressive disorder, single episode, unspecified: Secondary | ICD-10-CM | POA: Diagnosis not present

## 2011-12-11 DIAGNOSIS — I10 Essential (primary) hypertension: Secondary | ICD-10-CM | POA: Diagnosis not present

## 2011-12-28 DIAGNOSIS — H5340 Unspecified visual field defects: Secondary | ICD-10-CM | POA: Diagnosis not present

## 2012-02-12 DIAGNOSIS — E039 Hypothyroidism, unspecified: Secondary | ICD-10-CM | POA: Diagnosis not present

## 2012-02-12 DIAGNOSIS — E785 Hyperlipidemia, unspecified: Secondary | ICD-10-CM | POA: Diagnosis not present

## 2012-02-12 DIAGNOSIS — I10 Essential (primary) hypertension: Secondary | ICD-10-CM | POA: Diagnosis not present

## 2012-02-12 DIAGNOSIS — R5381 Other malaise: Secondary | ICD-10-CM | POA: Diagnosis not present

## 2012-04-23 ENCOUNTER — Emergency Department: Payer: Self-pay | Admitting: *Deleted

## 2012-04-23 DIAGNOSIS — R42 Dizziness and giddiness: Secondary | ICD-10-CM | POA: Diagnosis not present

## 2012-04-23 DIAGNOSIS — R112 Nausea with vomiting, unspecified: Secondary | ICD-10-CM | POA: Diagnosis not present

## 2012-04-23 DIAGNOSIS — Z7901 Long term (current) use of anticoagulants: Secondary | ICD-10-CM | POA: Diagnosis not present

## 2012-04-23 DIAGNOSIS — Z8673 Personal history of transient ischemic attack (TIA), and cerebral infarction without residual deficits: Secondary | ICD-10-CM | POA: Diagnosis not present

## 2012-04-23 DIAGNOSIS — R6889 Other general symptoms and signs: Secondary | ICD-10-CM | POA: Diagnosis not present

## 2012-04-23 DIAGNOSIS — Z79899 Other long term (current) drug therapy: Secondary | ICD-10-CM | POA: Diagnosis not present

## 2012-04-23 DIAGNOSIS — Z9089 Acquired absence of other organs: Secondary | ICD-10-CM | POA: Diagnosis not present

## 2012-04-23 DIAGNOSIS — E860 Dehydration: Secondary | ICD-10-CM | POA: Diagnosis not present

## 2012-04-23 LAB — COMPREHENSIVE METABOLIC PANEL
Alkaline Phosphatase: 63 U/L (ref 50–136)
Anion Gap: 7 (ref 7–16)
Bilirubin,Total: 0.5 mg/dL (ref 0.2–1.0)
EGFR (African American): 52 — ABNORMAL LOW
EGFR (Non-African Amer.): 45 — ABNORMAL LOW
Potassium: 3.7 mmol/L (ref 3.5–5.1)
Sodium: 141 mmol/L (ref 136–145)

## 2012-04-23 LAB — CBC
MCH: 33 pg (ref 26.0–34.0)
MCV: 98 fL (ref 80–100)
Platelet: 156 10*3/uL (ref 150–440)

## 2012-04-23 LAB — PROTIME-INR
INR: 1.5
Prothrombin Time: 18.9 secs — ABNORMAL HIGH (ref 11.5–14.7)

## 2012-04-23 IMAGING — CT CT HEAD WITHOUT CONTRAST
2 series · 15 of 30 positions shown, 19 images · non-contrast
Comparison: none

REASON FOR EXAM: vertigo
COMMENTS:

[Series 2: without · axial · non-contrast · 0.42mm/px · z∈[-176,-50]mm · 13 of 31 slices shown, 17 images]
[im 3/31  brain]
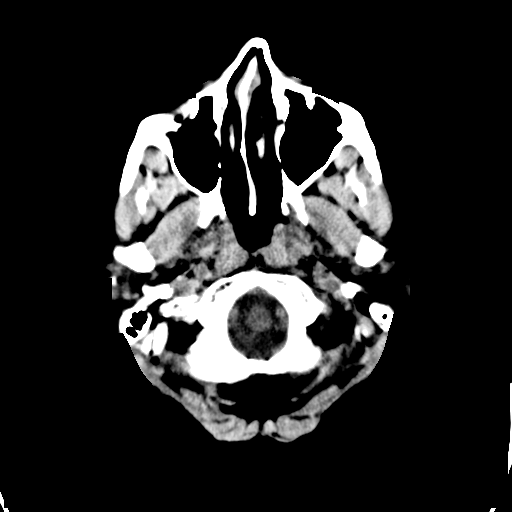
[im 3/31  bone]
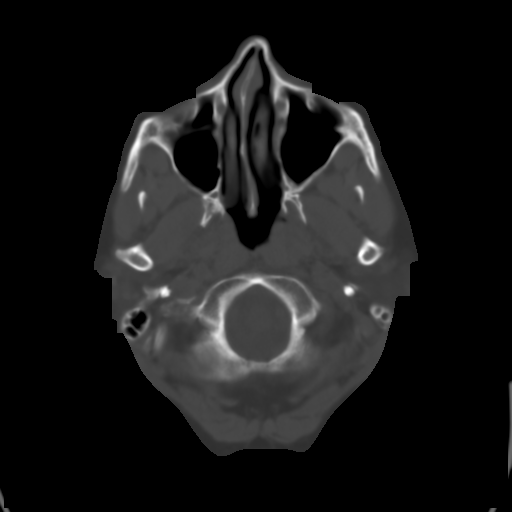
[im 5/31  brain]
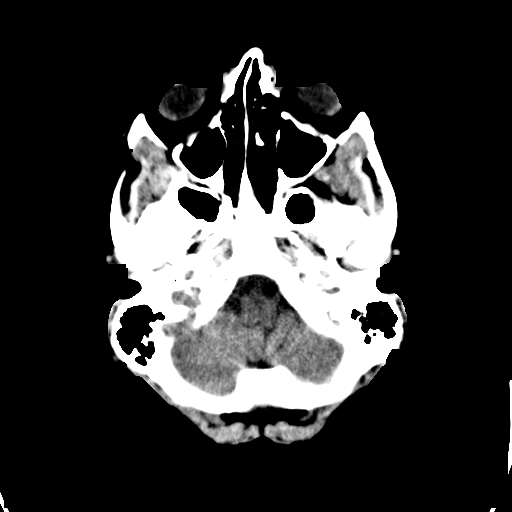
[im 7/31  brain]
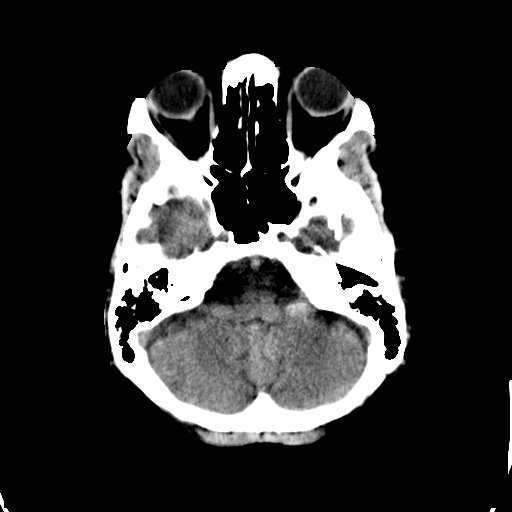
[im 9/31  brain]
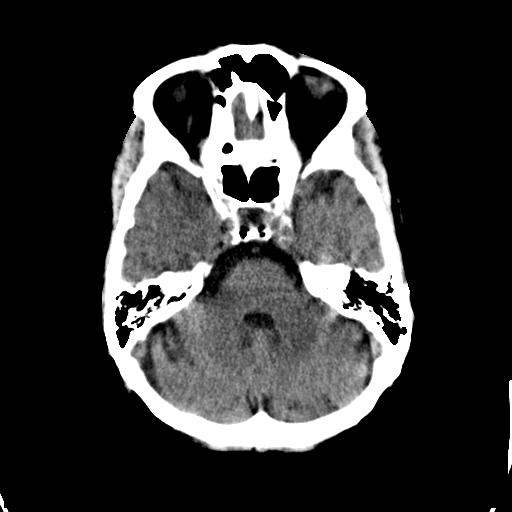
[im 11/31  brain]
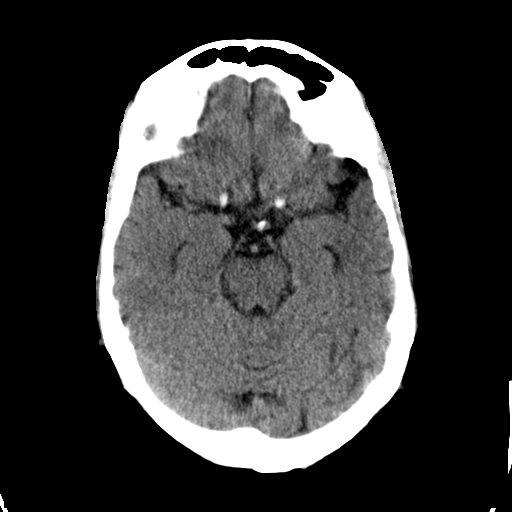
[im 11/31  bone]
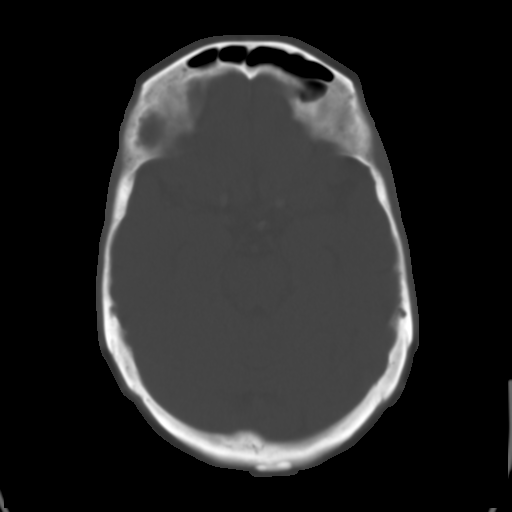
[im 13/31  brain]
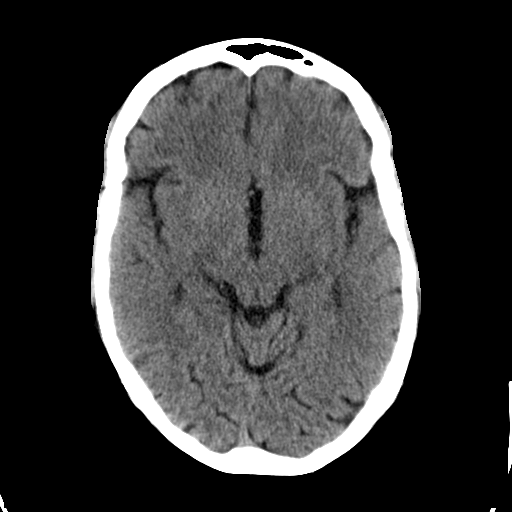
[im 16/31  brain]
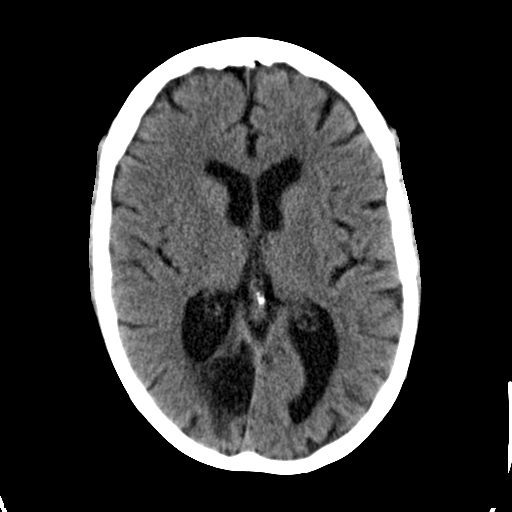
[im 18/31  brain]
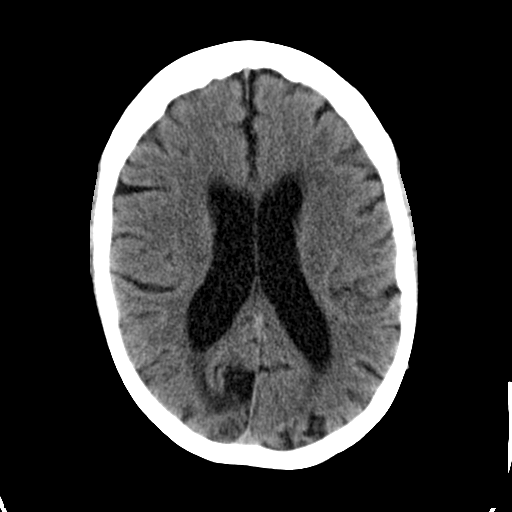
[im 20/31  brain]
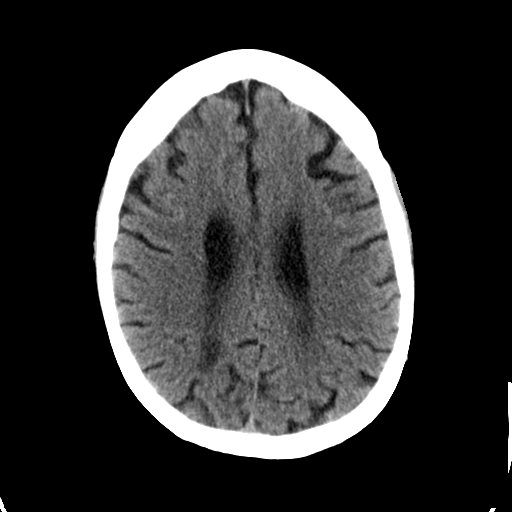
[im 20/31  bone]
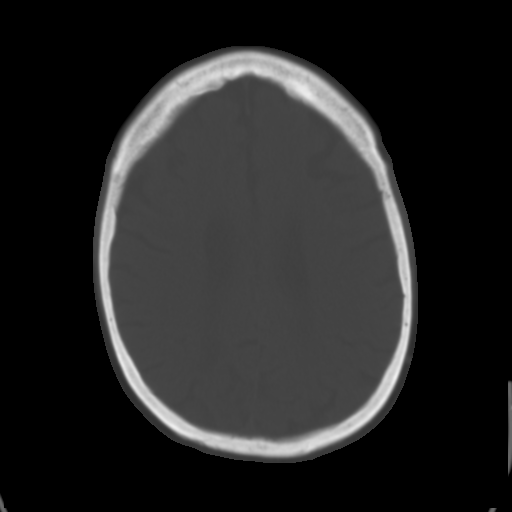
[im 22/31  brain]
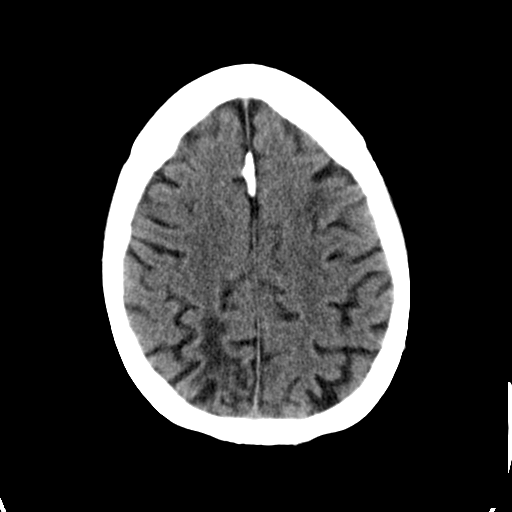
[im 24/31  brain]
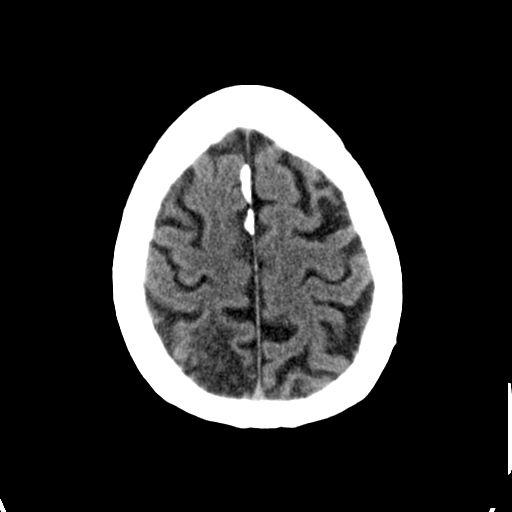
[im 26/31  brain]
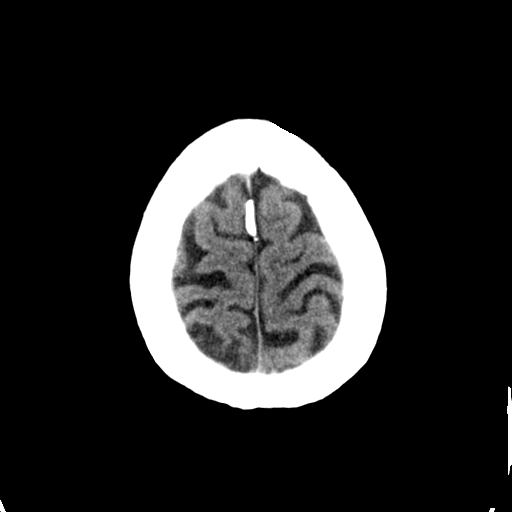
[im 28/31  brain]
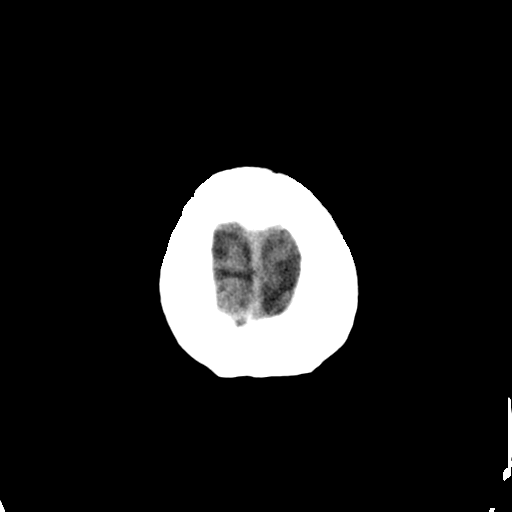
[im 28/31  bone]
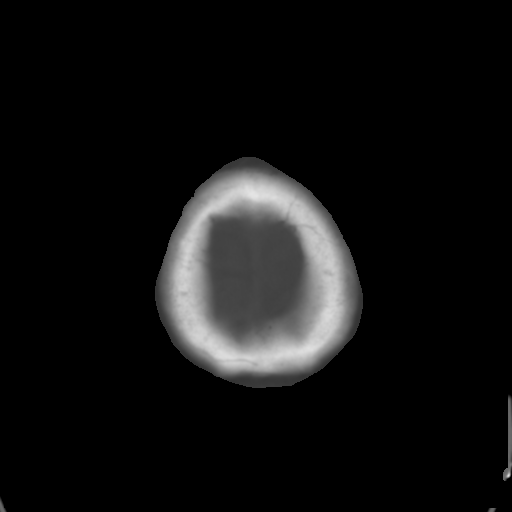

[Series 3: bone · axial · 0.42mm/px · z∈[-176,-156]mm · 2 of 31 slices shown]
[im 3/31  bone]
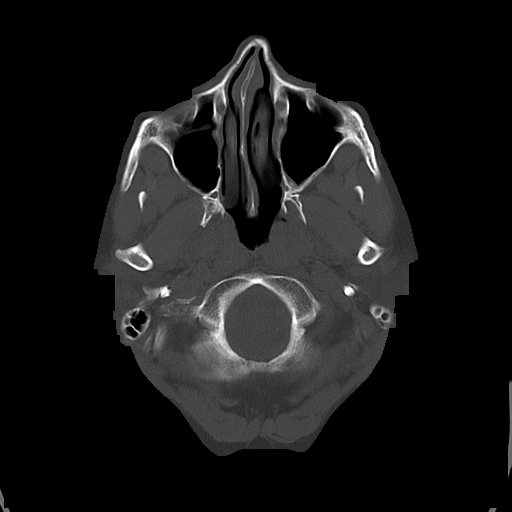
[im 7/31  bone]
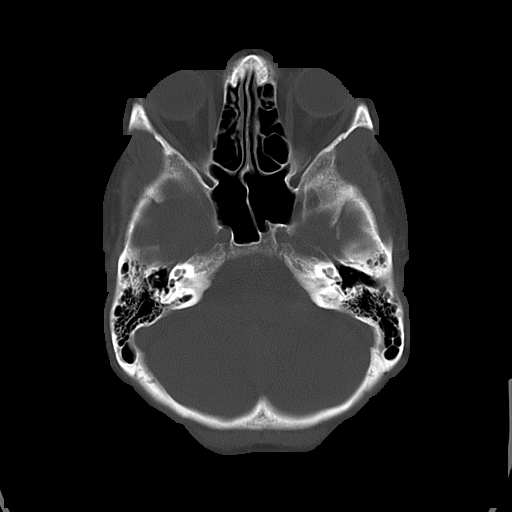

[15 of 30 positions shown; findings below may reference images not displayed]

PROCEDURE:     CT  - CT HEAD WITHOUT CONTRAST  - [DATE] [DATE]

RESULT:     Axial noncontrast CT scanning was performed through the brain
with reconstructions at 5 mm intervals and slice thicknesses. There is no
recent CT scan of the brain for comparison. The report of CT scan [DATE] is available.

There is mild age-appropriate diffuse cerebral atrophy. An old right
occipital infarction is present and has been previously described. There is
no shift of the midline. The ventricles demonstrate very minimal prominence
is consistent with the degree of diffuse atrophy present. There is no
intracranial hemorrhage nor intracranial mass effect. At bone window
settings I do not see evidence of an acute skull fracture.
IMPRESSION: There are chronic changes noted in the right occipital
lobes and there is mild diffuse cerebral atrophy not out of proportion to
the patient's age. I do not see evidence of an acute ischemic event, and
intracranial hemorrhage nor intracranial mass effect.

A preliminary report was sent to the [HOSPITAL] the conclusion
of the study.

[REDACTED]

## 2012-04-24 DIAGNOSIS — R42 Dizziness and giddiness: Secondary | ICD-10-CM | POA: Diagnosis not present

## 2012-04-24 LAB — URINALYSIS, COMPLETE
Bacteria: NONE SEEN
Blood: NEGATIVE
Glucose,UR: NEGATIVE mg/dL (ref 0–75)
Ketone: NEGATIVE
Leukocyte Esterase: NEGATIVE
Nitrite: NEGATIVE
Ph: 5 (ref 4.5–8.0)
RBC,UR: <1 /HPF (ref 0–5)
Squamous Epithelial: NONE SEEN
WBC UR: 1 /HPF (ref 0–5)

## 2012-04-24 LAB — APTT: Activated PTT: 72.1 secs — ABNORMAL HIGH (ref 23.6–35.9)

## 2012-04-25 DIAGNOSIS — H8309 Labyrinthitis, unspecified ear: Secondary | ICD-10-CM | POA: Diagnosis not present

## 2012-05-14 DIAGNOSIS — I69998 Other sequelae following unspecified cerebrovascular disease: Secondary | ICD-10-CM | POA: Diagnosis not present

## 2012-05-14 DIAGNOSIS — R42 Dizziness and giddiness: Secondary | ICD-10-CM | POA: Diagnosis not present

## 2012-05-27 DIAGNOSIS — Z7901 Long term (current) use of anticoagulants: Secondary | ICD-10-CM | POA: Diagnosis not present

## 2012-08-15 DIAGNOSIS — Z7901 Long term (current) use of anticoagulants: Secondary | ICD-10-CM | POA: Diagnosis not present

## 2012-08-15 DIAGNOSIS — I10 Essential (primary) hypertension: Secondary | ICD-10-CM | POA: Diagnosis not present

## 2012-08-15 DIAGNOSIS — E785 Hyperlipidemia, unspecified: Secondary | ICD-10-CM | POA: Diagnosis not present

## 2012-08-15 DIAGNOSIS — E039 Hypothyroidism, unspecified: Secondary | ICD-10-CM | POA: Diagnosis not present

## 2012-09-02 DIAGNOSIS — Z23 Encounter for immunization: Secondary | ICD-10-CM | POA: Diagnosis not present

## 2012-09-02 DIAGNOSIS — Z7901 Long term (current) use of anticoagulants: Secondary | ICD-10-CM | POA: Diagnosis not present

## 2012-09-16 DIAGNOSIS — I10 Essential (primary) hypertension: Secondary | ICD-10-CM | POA: Diagnosis not present

## 2012-09-16 DIAGNOSIS — E785 Hyperlipidemia, unspecified: Secondary | ICD-10-CM | POA: Diagnosis not present

## 2012-09-16 DIAGNOSIS — E039 Hypothyroidism, unspecified: Secondary | ICD-10-CM | POA: Diagnosis not present

## 2012-09-16 DIAGNOSIS — Z7901 Long term (current) use of anticoagulants: Secondary | ICD-10-CM | POA: Diagnosis not present

## 2012-10-01 DIAGNOSIS — Z01419 Encounter for gynecological examination (general) (routine) without abnormal findings: Secondary | ICD-10-CM | POA: Diagnosis not present

## 2012-10-01 DIAGNOSIS — Z1151 Encounter for screening for human papillomavirus (HPV): Secondary | ICD-10-CM | POA: Diagnosis not present

## 2012-10-14 DIAGNOSIS — M23302 Other meniscus derangements, unspecified lateral meniscus, unspecified knee: Secondary | ICD-10-CM | POA: Diagnosis not present

## 2012-10-14 DIAGNOSIS — S83419A Sprain of medial collateral ligament of unspecified knee, initial encounter: Secondary | ICD-10-CM | POA: Diagnosis not present

## 2012-10-23 DIAGNOSIS — M23302 Other meniscus derangements, unspecified lateral meniscus, unspecified knee: Secondary | ICD-10-CM | POA: Diagnosis not present

## 2012-10-27 ENCOUNTER — Ambulatory Visit: Payer: Self-pay

## 2012-10-27 DIAGNOSIS — Z1231 Encounter for screening mammogram for malignant neoplasm of breast: Secondary | ICD-10-CM | POA: Diagnosis not present

## 2012-10-27 IMAGING — MG MM DIGITAL SCREENING BILAT W/ CAD
1 series · 5 of 5 positions shown · non-contrast
Comparison: none

REASON FOR EXAM: screening
COMMENTS:  Submitted by practice: [facility] Scheduled by user:

PROCEDURE:     MMM - MMM DGTL SCREENING MAMMO W/CAD  - [DATE] [DATE]
RESULT:     Comparison is made to prior studies dated [DATE], [DATE],
[DATE] and [DATE].

[R CC · right · 5 of 5 slices shown]
[im 1/5]
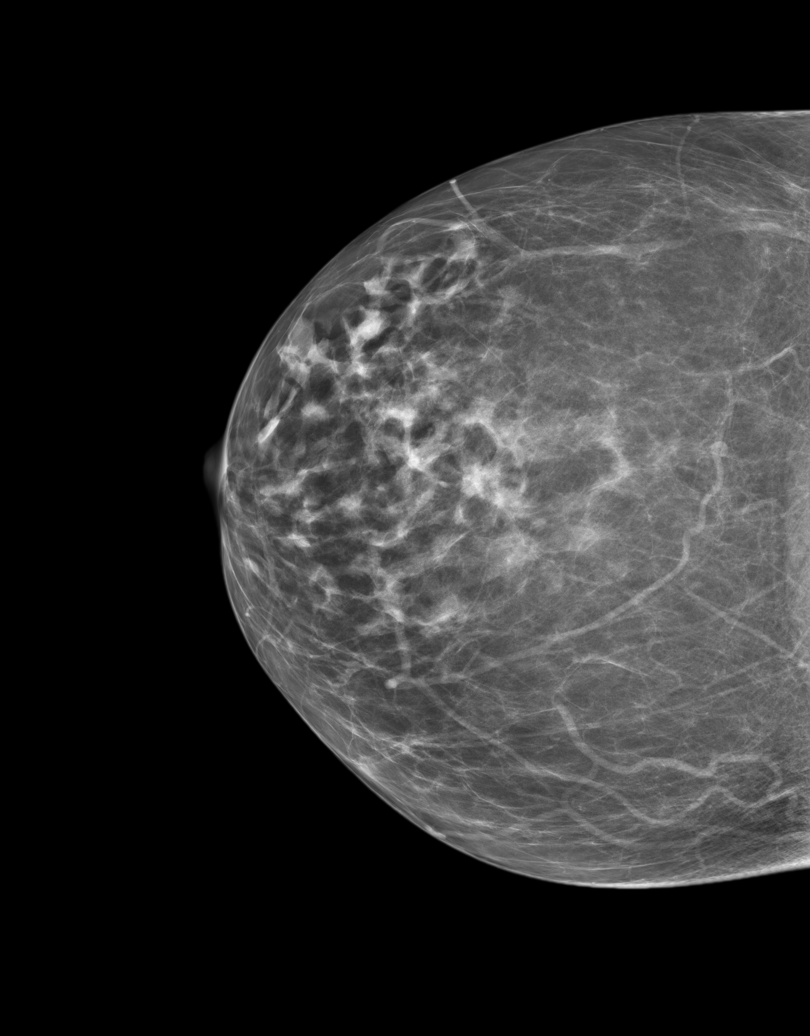
[im 2/5]
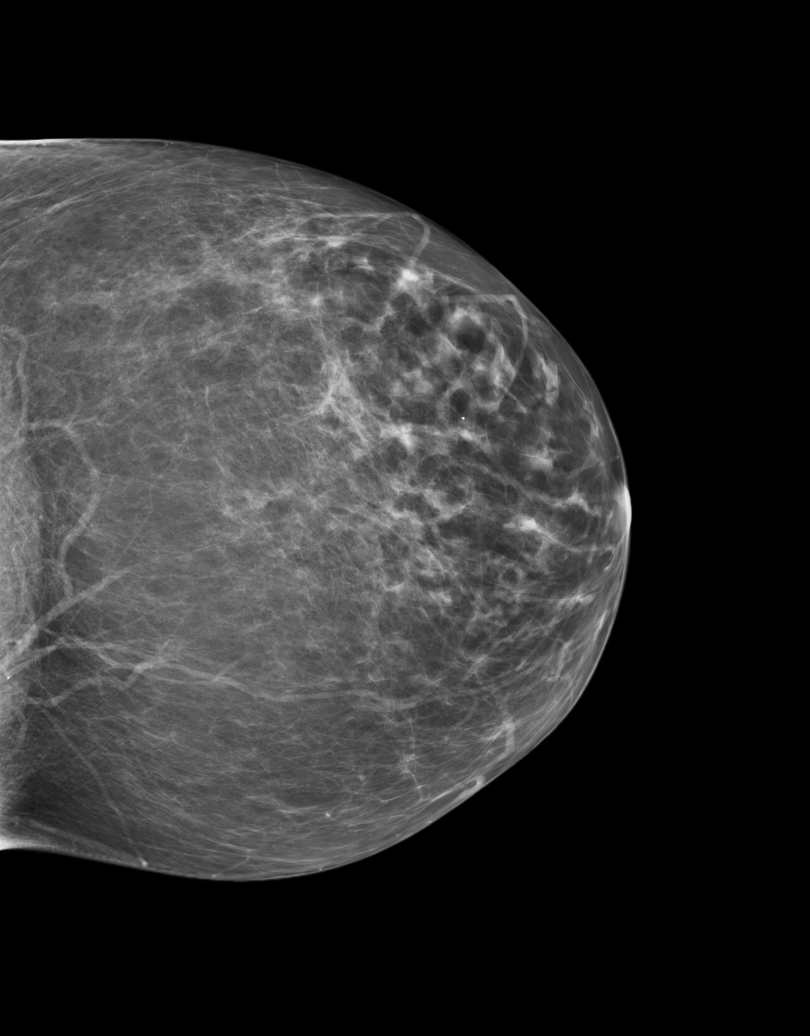
[im 3/5]
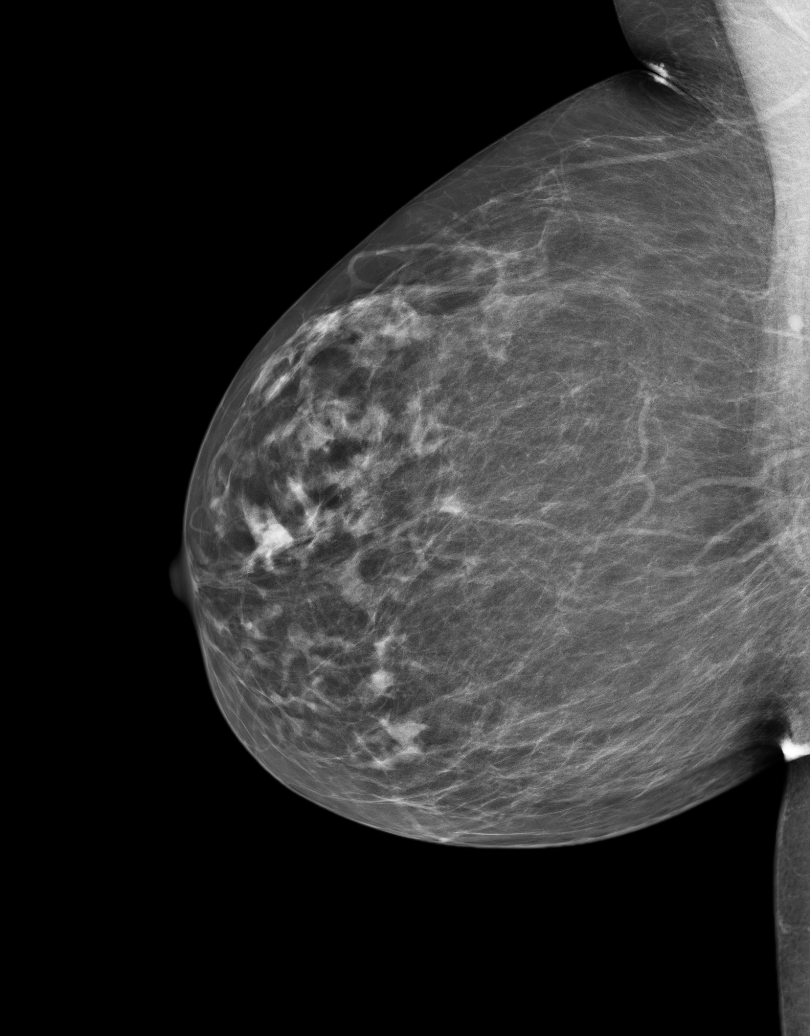
[im 4/5]
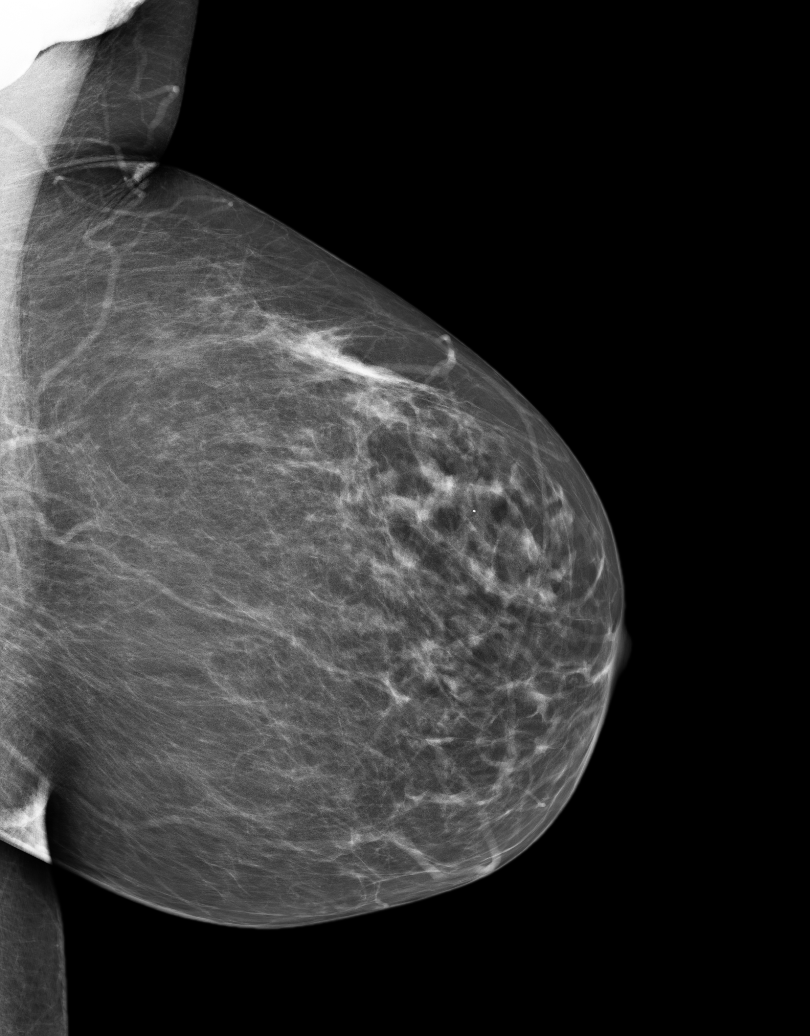
[im 5/5]
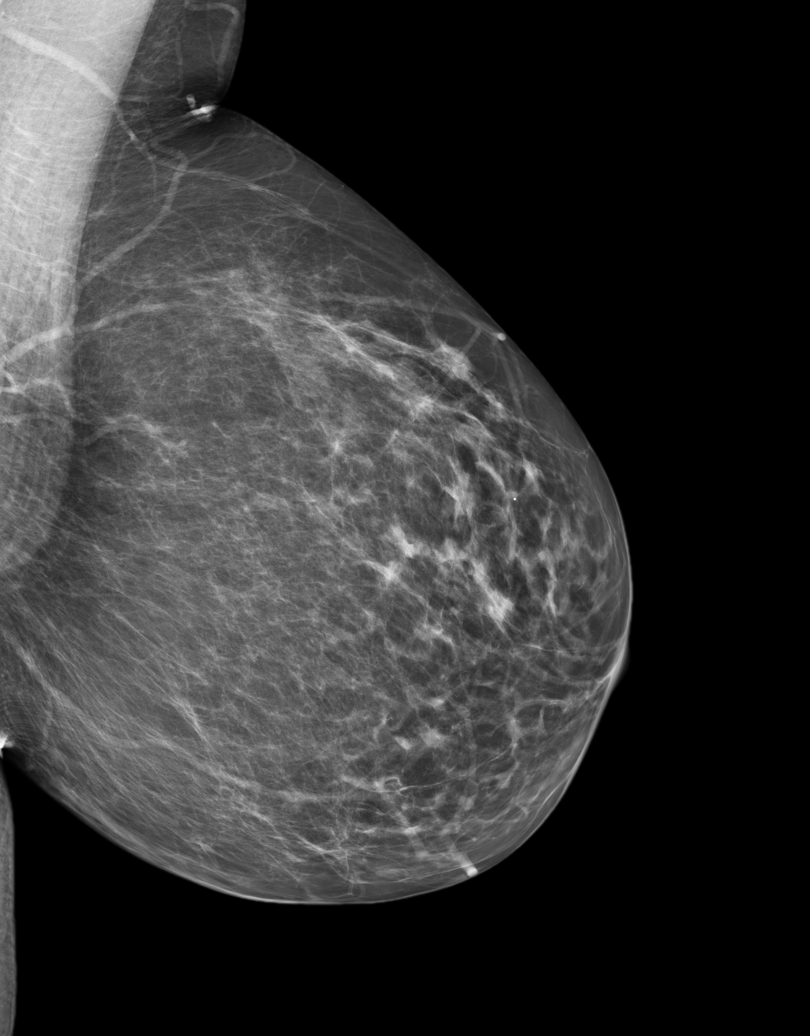

[5 of 5 positions shown; findings below may reference images not displayed]

FINDINGS: The breasts demonstrate a mildly dense heterogeneous parenchymal
pattern. There is no new radiographic evidence to suggest malignancy.
IMPRESSION: BI-RADS: Category 2 - Benign Findings

Thank you for this opportunity to contribute to the care of your patient.

A NEGATIVE MAMMOGRAM REPORT DOES NOT PRECLUDE BIOPSY OR OTHER EVALUATION OF
A CLINICALLY PALPABLE OR OTHERWISE SUSPICIOUS MASS OR LESION. BREAST CANCER
MAY NOT BE DETECTED BY MAMMOGRAPHY IN UP TO 10% OF CASES.

## 2012-12-05 DIAGNOSIS — M25569 Pain in unspecified knee: Secondary | ICD-10-CM | POA: Diagnosis not present

## 2012-12-10 DIAGNOSIS — H53469 Homonymous bilateral field defects, unspecified side: Secondary | ICD-10-CM | POA: Diagnosis not present

## 2012-12-12 DIAGNOSIS — M6281 Muscle weakness (generalized): Secondary | ICD-10-CM | POA: Diagnosis not present

## 2012-12-12 DIAGNOSIS — M25569 Pain in unspecified knee: Secondary | ICD-10-CM | POA: Diagnosis not present

## 2012-12-26 DIAGNOSIS — M6281 Muscle weakness (generalized): Secondary | ICD-10-CM | POA: Diagnosis not present

## 2012-12-26 DIAGNOSIS — M25569 Pain in unspecified knee: Secondary | ICD-10-CM | POA: Diagnosis not present

## 2013-01-12 DIAGNOSIS — Z7901 Long term (current) use of anticoagulants: Secondary | ICD-10-CM | POA: Diagnosis not present

## 2013-01-22 DIAGNOSIS — L819 Disorder of pigmentation, unspecified: Secondary | ICD-10-CM | POA: Diagnosis not present

## 2013-01-27 DIAGNOSIS — Z7901 Long term (current) use of anticoagulants: Secondary | ICD-10-CM | POA: Diagnosis not present

## 2013-01-27 DIAGNOSIS — E785 Hyperlipidemia, unspecified: Secondary | ICD-10-CM | POA: Diagnosis not present

## 2013-02-11 DIAGNOSIS — E039 Hypothyroidism, unspecified: Secondary | ICD-10-CM | POA: Diagnosis not present

## 2013-02-11 DIAGNOSIS — I43 Cardiomyopathy in diseases classified elsewhere: Secondary | ICD-10-CM | POA: Diagnosis not present

## 2013-02-11 DIAGNOSIS — F329 Major depressive disorder, single episode, unspecified: Secondary | ICD-10-CM | POA: Diagnosis not present

## 2013-02-11 DIAGNOSIS — D689 Coagulation defect, unspecified: Secondary | ICD-10-CM | POA: Diagnosis not present

## 2013-03-17 DIAGNOSIS — Z7901 Long term (current) use of anticoagulants: Secondary | ICD-10-CM | POA: Diagnosis not present

## 2013-04-16 DIAGNOSIS — Z7901 Long term (current) use of anticoagulants: Secondary | ICD-10-CM | POA: Diagnosis not present

## 2013-05-25 ENCOUNTER — Ambulatory Visit: Payer: Self-pay | Admitting: Family Medicine

## 2013-05-25 DIAGNOSIS — M629 Disorder of muscle, unspecified: Secondary | ICD-10-CM | POA: Diagnosis not present

## 2013-05-25 DIAGNOSIS — M719 Bursopathy, unspecified: Secondary | ICD-10-CM | POA: Diagnosis not present

## 2013-05-25 DIAGNOSIS — M25519 Pain in unspecified shoulder: Secondary | ICD-10-CM | POA: Diagnosis not present

## 2013-05-25 DIAGNOSIS — Z7901 Long term (current) use of anticoagulants: Secondary | ICD-10-CM | POA: Diagnosis not present

## 2013-05-25 IMAGING — CR DG SHOULDER 3+V*L*
1 series · 4 of 4 positions shown · non-contrast
Comparison: none

REASON FOR EXAM: limited rom
COMMENTS:

PROCEDURE:     MDR - MDR SHOULDER LEFT COMPLETE  - [DATE] [DATE]
RESULT:     Left shoulder images show no fracture, dislocation or bony
destruction.

[Series 1: internal rotate · 0.17mm/px · 4 of 4 slices shown]
[im 1/4]
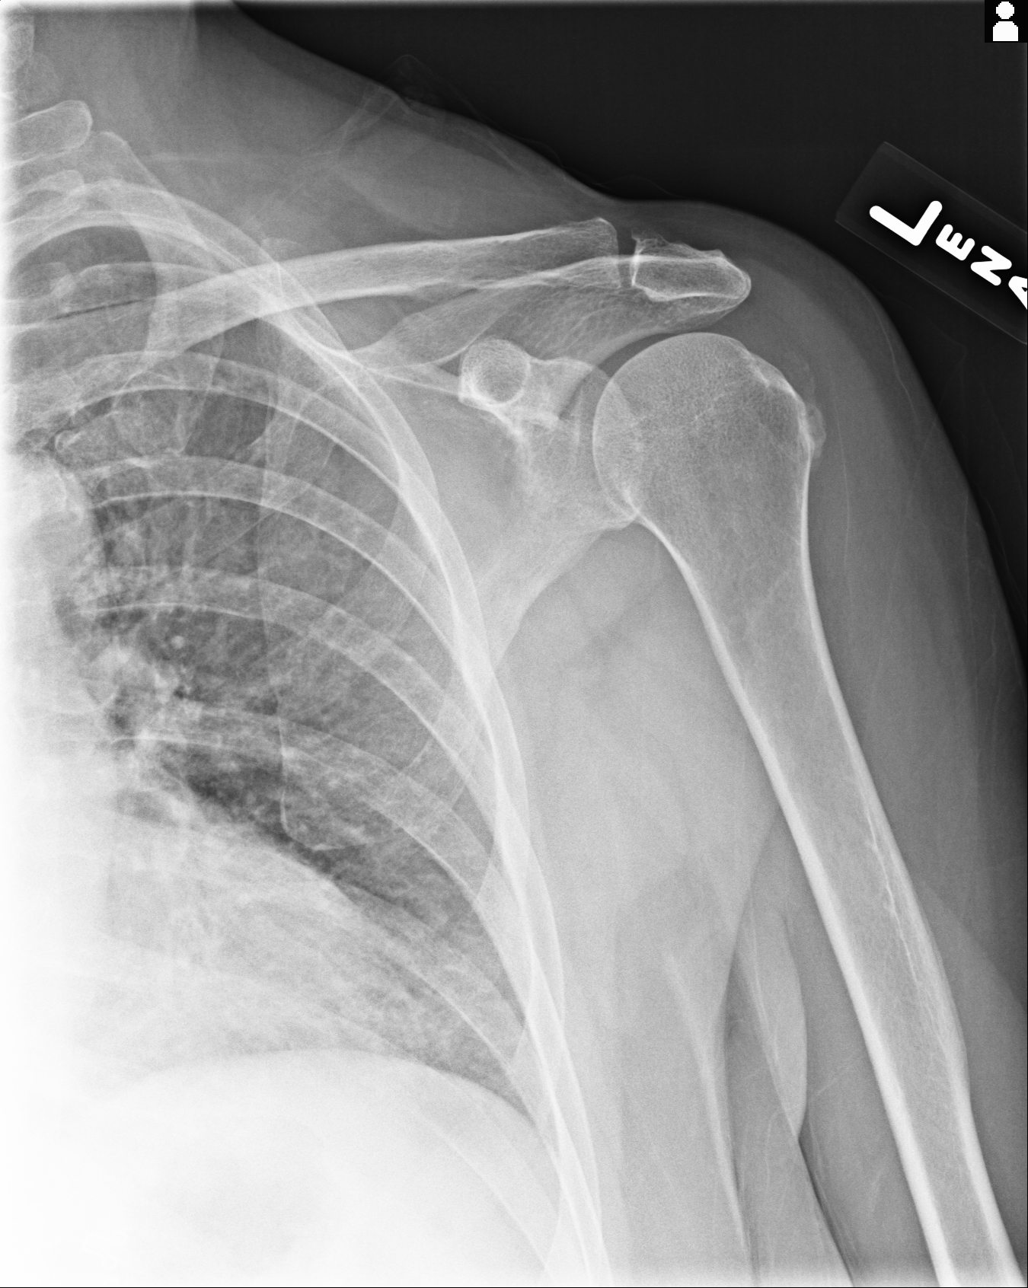
[im 2/4]
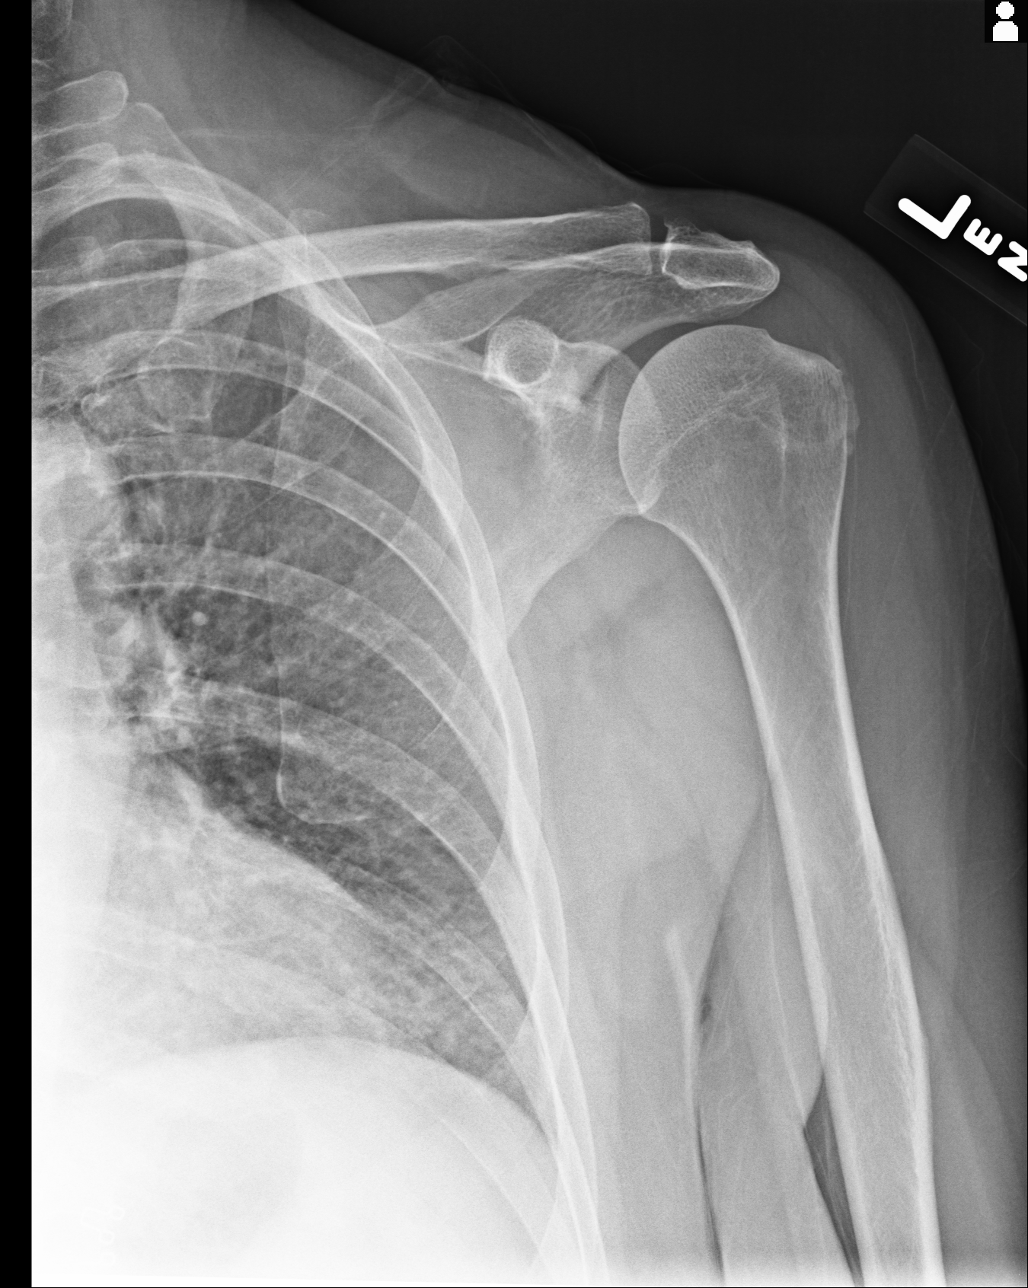
[im 3/4]
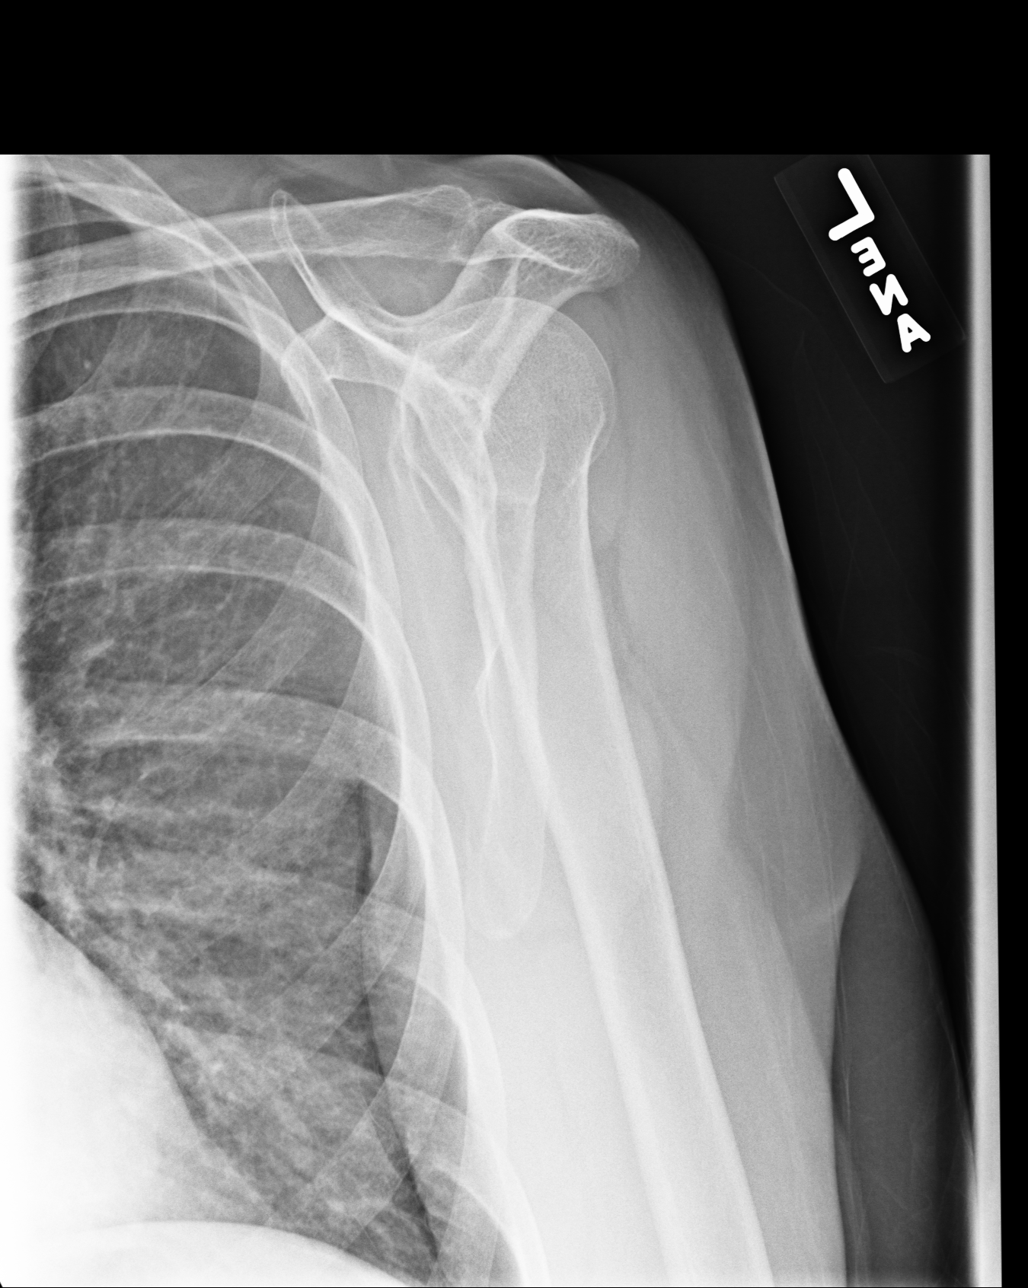
[im 4/4]
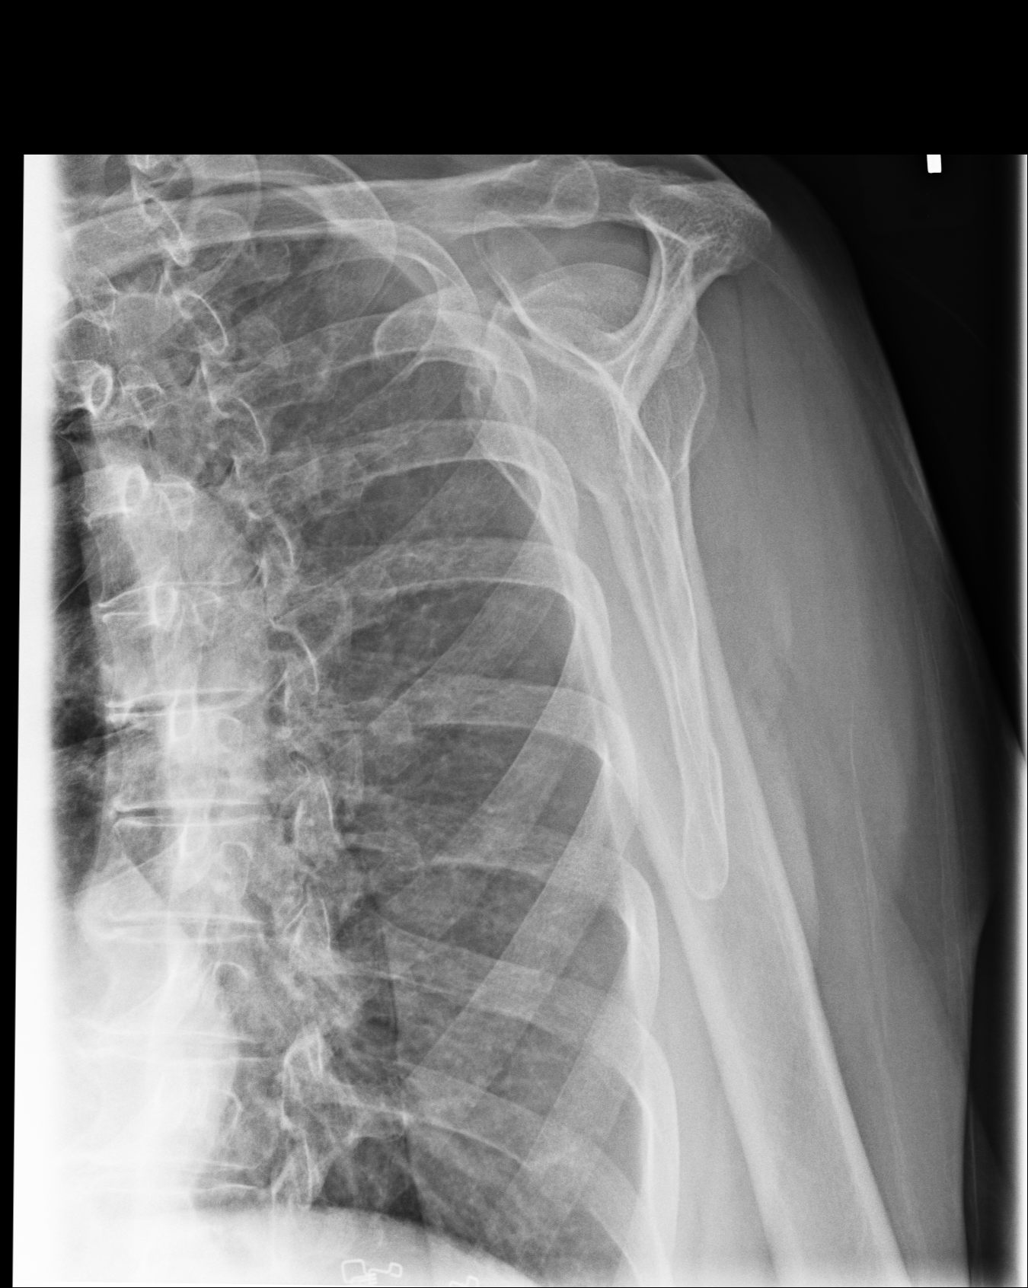

[4 of 4 positions shown; findings below may reference images not displayed]

IMPRESSION: Please see above.

[REDACTED]

## 2013-05-27 DIAGNOSIS — M25519 Pain in unspecified shoulder: Secondary | ICD-10-CM | POA: Diagnosis not present

## 2013-06-11 DIAGNOSIS — Z7901 Long term (current) use of anticoagulants: Secondary | ICD-10-CM | POA: Diagnosis not present

## 2013-07-21 DIAGNOSIS — Z7901 Long term (current) use of anticoagulants: Secondary | ICD-10-CM | POA: Diagnosis not present

## 2013-09-07 DIAGNOSIS — Z7901 Long term (current) use of anticoagulants: Secondary | ICD-10-CM | POA: Diagnosis not present

## 2013-10-26 DIAGNOSIS — Z7901 Long term (current) use of anticoagulants: Secondary | ICD-10-CM | POA: Diagnosis not present

## 2013-11-19 DIAGNOSIS — H53469 Homonymous bilateral field defects, unspecified side: Secondary | ICD-10-CM | POA: Diagnosis not present

## 2013-12-16 DIAGNOSIS — S022XXA Fracture of nasal bones, initial encounter for closed fracture: Secondary | ICD-10-CM | POA: Diagnosis not present

## 2013-12-16 DIAGNOSIS — S300XXA Contusion of lower back and pelvis, initial encounter: Secondary | ICD-10-CM | POA: Diagnosis not present

## 2013-12-16 DIAGNOSIS — S01501A Unspecified open wound of lip, initial encounter: Secondary | ICD-10-CM | POA: Diagnosis not present

## 2013-12-16 DIAGNOSIS — IMO0002 Reserved for concepts with insufficient information to code with codable children: Secondary | ICD-10-CM | POA: Diagnosis not present

## 2013-12-17 DIAGNOSIS — Z01419 Encounter for gynecological examination (general) (routine) without abnormal findings: Secondary | ICD-10-CM | POA: Diagnosis not present

## 2013-12-22 ENCOUNTER — Ambulatory Visit: Payer: Self-pay | Admitting: Nurse Practitioner

## 2013-12-22 DIAGNOSIS — Z1231 Encounter for screening mammogram for malignant neoplasm of breast: Secondary | ICD-10-CM | POA: Diagnosis not present

## 2013-12-22 IMAGING — MG MM DIGITAL SCREENING BILAT W/ CAD
1 series · 4 of 4 positions shown · non-contrast
Comparison: Previous exam(s).

CLINICAL DATA: Screening.

EXAM:
DIGITAL SCREENING BILATERAL MAMMOGRAM WITH CAD

[R CC · right · 4 of 4 slices shown]
[im 1/4]
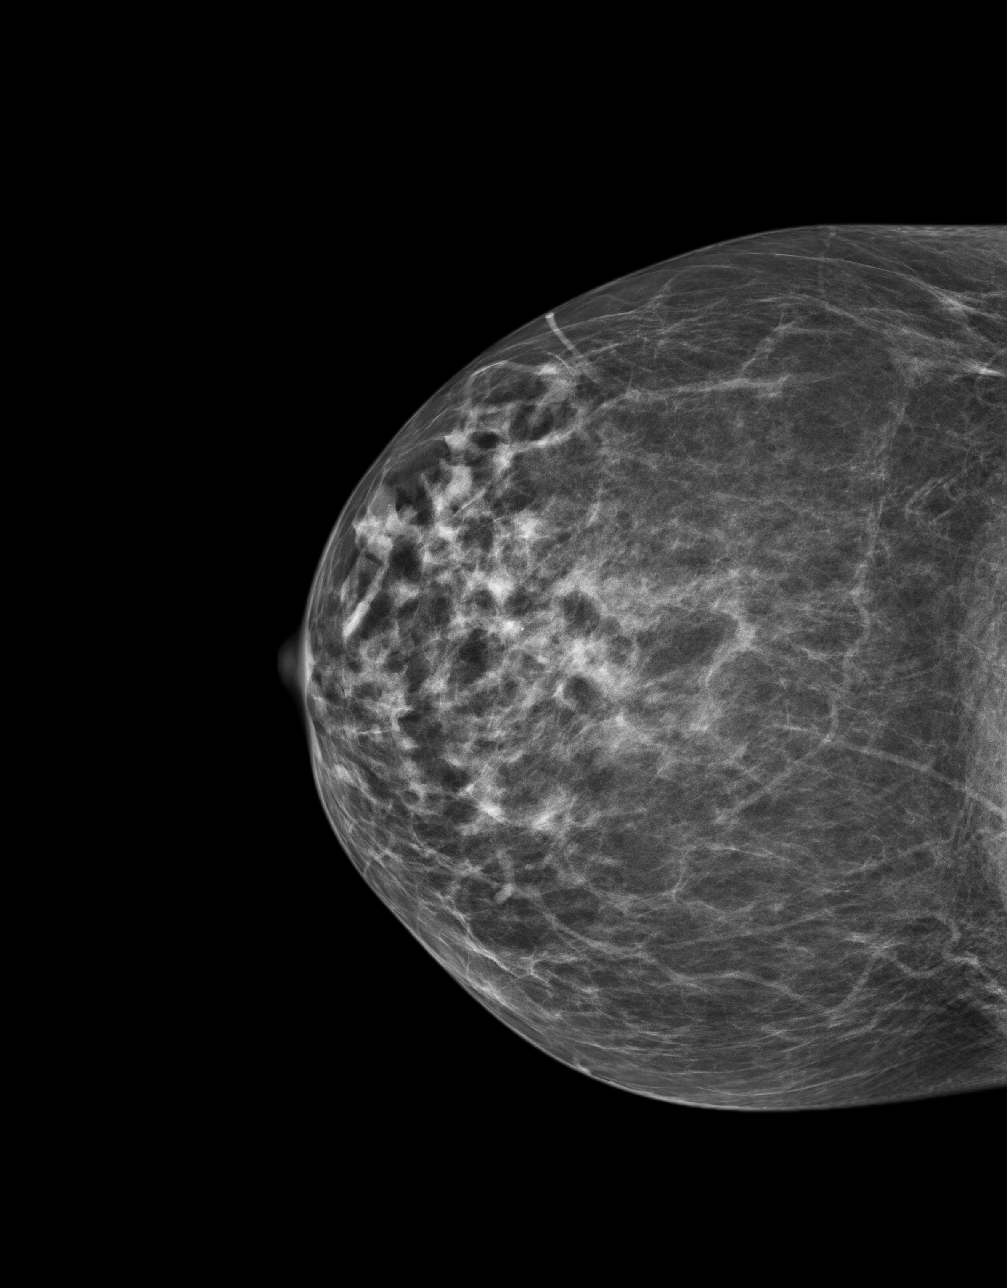
[im 2/4]
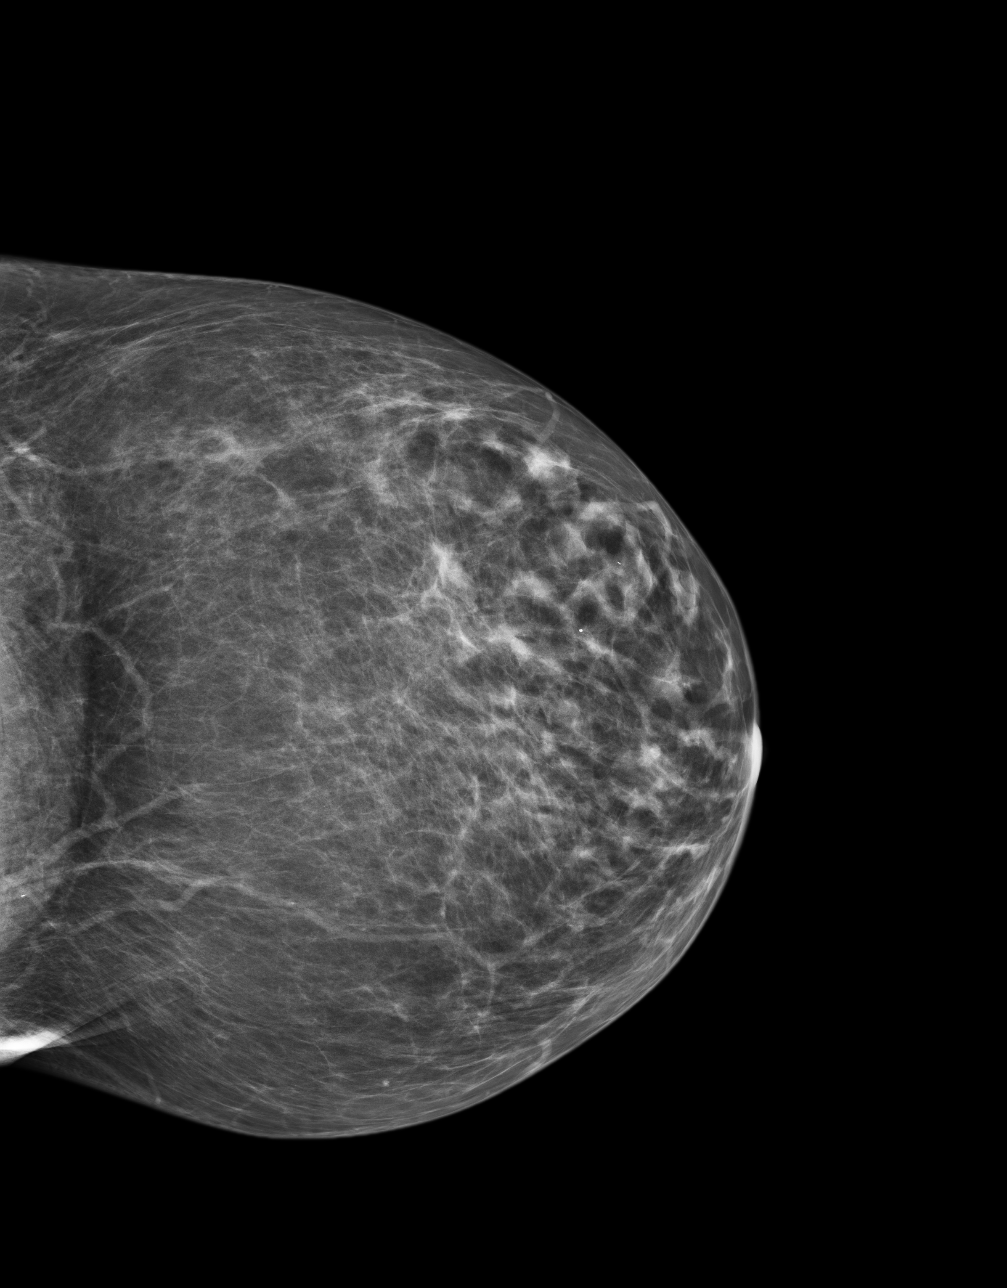
[im 3/4]
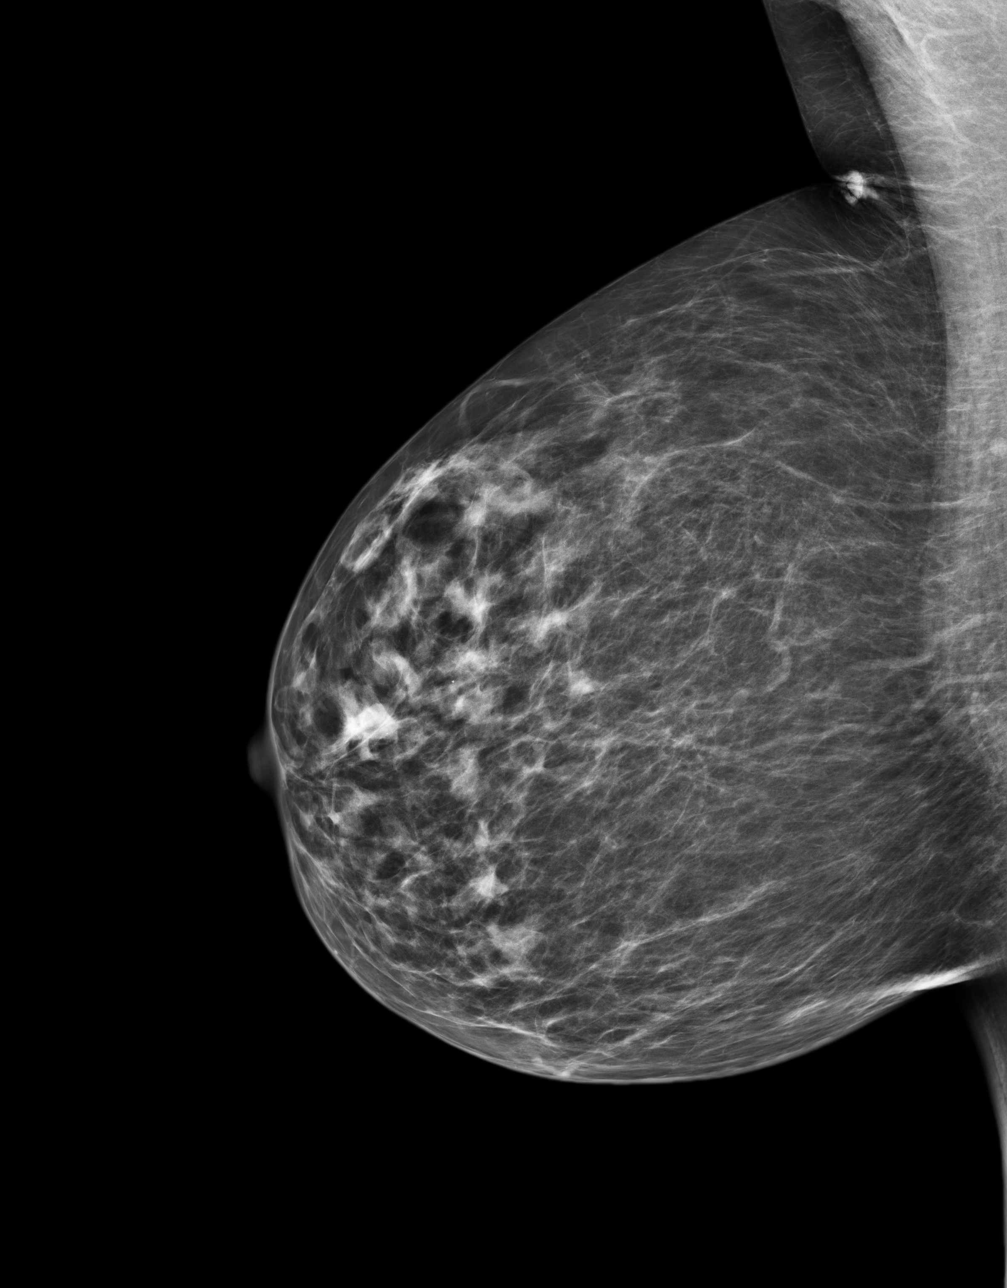
[im 4/4]
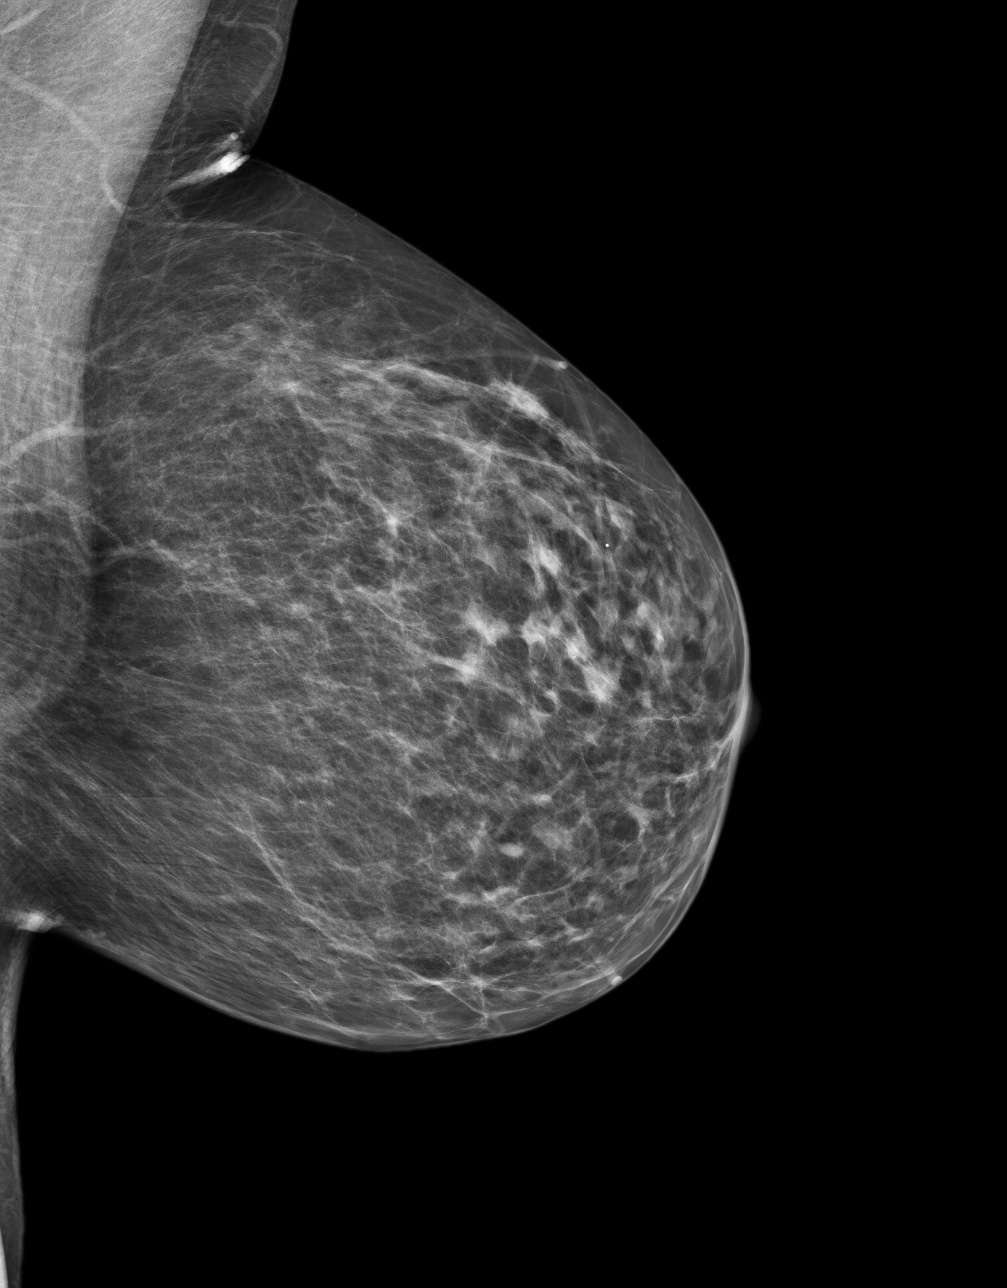

[4 of 4 positions shown; findings below may reference images not displayed]

ACR Breast Density Category c: The breast tissue is heterogeneously
dense, which may obscure small masses.
FINDINGS: There are no findings suspicious for malignancy. Images were
processed with CAD.
IMPRESSION: No mammographic evidence of malignancy. A result letter of this
screening mammogram will be mailed directly to the patient.

RECOMMENDATION:
Screening mammogram in one year. (Code:[1S])

BI-RADS CATEGORY  1: Negative

## 2013-12-24 ENCOUNTER — Ambulatory Visit: Payer: Self-pay | Admitting: Nurse Practitioner

## 2013-12-24 DIAGNOSIS — M899 Disorder of bone, unspecified: Secondary | ICD-10-CM | POA: Diagnosis not present

## 2013-12-24 DIAGNOSIS — M949 Disorder of cartilage, unspecified: Secondary | ICD-10-CM | POA: Diagnosis not present

## 2014-02-11 DIAGNOSIS — E039 Hypothyroidism, unspecified: Secondary | ICD-10-CM | POA: Diagnosis not present

## 2014-02-11 DIAGNOSIS — E785 Hyperlipidemia, unspecified: Secondary | ICD-10-CM | POA: Diagnosis not present

## 2014-02-11 DIAGNOSIS — I10 Essential (primary) hypertension: Secondary | ICD-10-CM | POA: Diagnosis not present

## 2014-02-11 DIAGNOSIS — Z7901 Long term (current) use of anticoagulants: Secondary | ICD-10-CM | POA: Diagnosis not present

## 2014-02-19 DIAGNOSIS — I43 Cardiomyopathy in diseases classified elsewhere: Secondary | ICD-10-CM | POA: Diagnosis not present

## 2014-02-19 DIAGNOSIS — I10 Essential (primary) hypertension: Secondary | ICD-10-CM | POA: Diagnosis not present

## 2014-02-19 DIAGNOSIS — E785 Hyperlipidemia, unspecified: Secondary | ICD-10-CM | POA: Diagnosis not present

## 2014-02-19 DIAGNOSIS — E039 Hypothyroidism, unspecified: Secondary | ICD-10-CM | POA: Diagnosis not present

## 2014-02-22 DIAGNOSIS — Z7901 Long term (current) use of anticoagulants: Secondary | ICD-10-CM | POA: Diagnosis not present

## 2014-03-29 DIAGNOSIS — Z7901 Long term (current) use of anticoagulants: Secondary | ICD-10-CM | POA: Diagnosis not present

## 2014-05-06 DIAGNOSIS — Z7901 Long term (current) use of anticoagulants: Secondary | ICD-10-CM | POA: Diagnosis not present

## 2014-06-01 DIAGNOSIS — Z7901 Long term (current) use of anticoagulants: Secondary | ICD-10-CM | POA: Diagnosis not present

## 2014-07-05 DIAGNOSIS — Z7901 Long term (current) use of anticoagulants: Secondary | ICD-10-CM | POA: Diagnosis not present

## 2014-07-20 DIAGNOSIS — R5381 Other malaise: Secondary | ICD-10-CM | POA: Diagnosis not present

## 2014-07-20 DIAGNOSIS — I43 Cardiomyopathy in diseases classified elsewhere: Secondary | ICD-10-CM | POA: Diagnosis not present

## 2014-07-20 DIAGNOSIS — I10 Essential (primary) hypertension: Secondary | ICD-10-CM | POA: Diagnosis not present

## 2014-07-20 DIAGNOSIS — E039 Hypothyroidism, unspecified: Secondary | ICD-10-CM | POA: Diagnosis not present

## 2014-10-11 DIAGNOSIS — Z7901 Long term (current) use of anticoagulants: Secondary | ICD-10-CM | POA: Diagnosis not present

## 2014-11-08 DIAGNOSIS — Z7901 Long term (current) use of anticoagulants: Secondary | ICD-10-CM | POA: Diagnosis not present

## 2014-11-24 DIAGNOSIS — I69398 Other sequelae of cerebral infarction: Secondary | ICD-10-CM | POA: Diagnosis not present

## 2014-12-01 DIAGNOSIS — H53462 Homonymous bilateral field defects, left side: Secondary | ICD-10-CM | POA: Diagnosis not present

## 2014-12-23 DIAGNOSIS — Z124 Encounter for screening for malignant neoplasm of cervix: Secondary | ICD-10-CM | POA: Diagnosis not present

## 2014-12-23 DIAGNOSIS — Z01419 Encounter for gynecological examination (general) (routine) without abnormal findings: Secondary | ICD-10-CM | POA: Diagnosis not present

## 2014-12-23 DIAGNOSIS — N3941 Urge incontinence: Secondary | ICD-10-CM | POA: Diagnosis not present

## 2015-02-15 DIAGNOSIS — E784 Other hyperlipidemia: Secondary | ICD-10-CM | POA: Diagnosis not present

## 2015-02-15 DIAGNOSIS — I10 Essential (primary) hypertension: Secondary | ICD-10-CM | POA: Diagnosis not present

## 2015-02-15 DIAGNOSIS — E039 Hypothyroidism, unspecified: Secondary | ICD-10-CM | POA: Diagnosis not present

## 2015-02-15 DIAGNOSIS — Z7901 Long term (current) use of anticoagulants: Secondary | ICD-10-CM | POA: Diagnosis not present

## 2015-02-17 DIAGNOSIS — E784 Other hyperlipidemia: Secondary | ICD-10-CM | POA: Diagnosis not present

## 2015-02-17 DIAGNOSIS — I43 Cardiomyopathy in diseases classified elsewhere: Secondary | ICD-10-CM | POA: Diagnosis not present

## 2015-02-17 DIAGNOSIS — E039 Hypothyroidism, unspecified: Secondary | ICD-10-CM | POA: Diagnosis not present

## 2015-02-17 DIAGNOSIS — G4700 Insomnia, unspecified: Secondary | ICD-10-CM | POA: Diagnosis not present

## 2015-02-17 DIAGNOSIS — I10 Essential (primary) hypertension: Secondary | ICD-10-CM | POA: Diagnosis not present

## 2015-02-17 DIAGNOSIS — F325 Major depressive disorder, single episode, in full remission: Secondary | ICD-10-CM | POA: Diagnosis not present

## 2015-03-30 DIAGNOSIS — Z7901 Long term (current) use of anticoagulants: Secondary | ICD-10-CM | POA: Diagnosis not present

## 2015-04-18 DIAGNOSIS — Z7901 Long term (current) use of anticoagulants: Secondary | ICD-10-CM | POA: Diagnosis not present

## 2015-06-16 ENCOUNTER — Other Ambulatory Visit: Payer: Medicare Other

## 2015-06-16 DIAGNOSIS — Z7901 Long term (current) use of anticoagulants: Secondary | ICD-10-CM

## 2015-06-17 LAB — PROTIME-INR
INR: 2.4 — ABNORMAL HIGH (ref 0.8–1.2)
PROTHROMBIN TIME: 25.1 s — AB (ref 9.1–12.0)

## 2015-07-13 ENCOUNTER — Other Ambulatory Visit: Payer: Medicare Other

## 2015-07-13 DIAGNOSIS — Z7901 Long term (current) use of anticoagulants: Secondary | ICD-10-CM

## 2015-07-14 LAB — PROTIME-INR
INR: 3.4 — ABNORMAL HIGH (ref 0.8–1.2)
PROTHROMBIN TIME: 34.3 s — AB (ref 9.1–12.0)

## 2015-08-12 ENCOUNTER — Other Ambulatory Visit: Payer: Self-pay | Admitting: Family Medicine

## 2015-08-12 DIAGNOSIS — I10 Essential (primary) hypertension: Secondary | ICD-10-CM

## 2015-08-19 ENCOUNTER — Ambulatory Visit (INDEPENDENT_AMBULATORY_CARE_PROVIDER_SITE_OTHER): Payer: Medicare Other

## 2015-08-19 DIAGNOSIS — Z23 Encounter for immunization: Secondary | ICD-10-CM | POA: Diagnosis not present

## 2015-09-13 ENCOUNTER — Other Ambulatory Visit: Payer: Self-pay | Admitting: Family Medicine

## 2015-10-16 ENCOUNTER — Other Ambulatory Visit: Payer: Self-pay | Admitting: Family Medicine

## 2015-11-02 ENCOUNTER — Other Ambulatory Visit: Payer: Self-pay | Admitting: Family Medicine

## 2015-11-08 ENCOUNTER — Other Ambulatory Visit: Payer: Self-pay | Admitting: Family Medicine

## 2015-11-09 ENCOUNTER — Encounter: Payer: Self-pay | Admitting: Family Medicine

## 2015-11-09 ENCOUNTER — Ambulatory Visit (INDEPENDENT_AMBULATORY_CARE_PROVIDER_SITE_OTHER): Payer: Medicare Other | Admitting: Family Medicine

## 2015-11-09 VITALS — BP 120/84 | HR 80 | Ht 68.0 in | Wt 191.0 lb

## 2015-11-09 DIAGNOSIS — Z7901 Long term (current) use of anticoagulants: Secondary | ICD-10-CM

## 2015-11-09 DIAGNOSIS — R5383 Other fatigue: Secondary | ICD-10-CM | POA: Diagnosis not present

## 2015-11-09 NOTE — Progress Notes (Signed)
Name: [name] [facility]   MRN: [id number]    DOB: Aug 12, 1951   Date:11/09/2015       Progress Note  Subjective  Chief Complaint  Chief Complaint  Patient presents with  . Fatigue    feeling tired- have to take a nap in the middle of day    Thyroid Problem Presents for follow-up visit. Symptoms include dry skin and fatigue. Patient reports no anxiety, cold intolerance, constipation, depressed mood, diaphoresis, diarrhea, hair loss, heat intolerance, hoarse voice, leg swelling, nail problem, palpitations, tremors, visual change, weight gain or weight loss. The symptoms have been stable. Past treatments include levothyroxine. The treatment provided mild relief. There is no history of atrial fibrillation, dementia, diabetes, Graves' ophthalmopathy, heart failure, hyperlipidemia, neuropathy, obesity or osteopenia.    No problem-specific assessment & plan notes found for this encounter.   Past Medical History  Diagnosis Date  . Depression   . Hypertension   . Thyroid disease   . Stroke [facility])     Past Surgical History  Procedure Laterality Date  . Cholecystectomy    . Cesarean section    . Knee arthroscopy w/ meniscal repair Left   . Colonoscopy  2012    repeat in 10 yrs    Family History  Problem Relation Age of Onset  . Stroke Father     Social History   Social History  . Marital Status: Married    Spouse Name: N/A  . Number of Children: N/A  . Years of Education: N/A   Occupational History  . Not on file.   Social History Main Topics  . Smoking status: Never Smoker   . Smokeless tobacco: Not on file  . Alcohol Use: No  . Drug Use: No  . Sexual Activity: No   Other Topics Concern  . Not on file   Social History Narrative  . No narrative on file    Allergies  Allergen Reactions  . Penicillins      Review of Systems  Constitutional: Positive for fatigue. Negative for fever, chills, weight loss, weight gain, malaise/fatigue and diaphoresis.  HENT:  Negative for ear discharge, ear pain, hoarse voice and sore throat.   Eyes: Negative for blurred vision.  Respiratory: Negative for cough, sputum production, shortness of breath and wheezing.   Cardiovascular: Negative for chest pain, palpitations and leg swelling.  Gastrointestinal: Negative for heartburn, nausea, abdominal pain, diarrhea, constipation, blood in stool and melena.  Genitourinary: Negative for dysuria, urgency, frequency and hematuria.  Musculoskeletal: Negative for myalgias, back pain, joint pain and neck pain.  Skin: Negative for rash.  Neurological: Negative for dizziness, tingling, tremors, sensory change, focal weakness and headaches.  Endo/Heme/Allergies: Negative for environmental allergies, cold intolerance, heat intolerance and polydipsia. Does not bruise/bleed easily.  Psychiatric/Behavioral: Negative for depression and suicidal ideas. The patient is not nervous/anxious and does not have insomnia.      Objective  Filed Vitals:   11/09/15 1010  BP: 120/84  Pulse: 80  Height: '5\' 8"'  (1.727 m)  Weight: 191 lb (86.637 kg)    Physical Exam  Constitutional: She is well-developed, well-nourished, and in no distress. No distress.  HENT:  Head: Normocephalic and atraumatic.  Right Ear: External ear normal.  Left Ear: External ear normal.  Nose: Nose normal.  Mouth/Throat: Oropharynx is clear and moist.  Eyes: Conjunctivae and EOM are normal. Pupils are equal, round, and reactive to light. Right eye exhibits no discharge. Left eye exhibits no discharge.  Neck: Normal range of motion.  Neck supple. No JVD present. No thyromegaly present.  Cardiovascular: Normal rate, regular rhythm, normal heart sounds and intact distal pulses.  Exam reveals no gallop and no friction rub.   No murmur heard. Pulmonary/Chest: Effort normal and breath sounds normal.  Abdominal: Soft. Bowel sounds are normal. She exhibits no mass. There is no tenderness. There is no guarding.   Musculoskeletal: Normal range of motion. She exhibits no edema.  Lymphadenopathy:    She has no cervical adenopathy.  Neurological: She is alert.  Skin: Skin is warm and dry. She is not diaphoretic.  Psychiatric: Mood and affect normal.      Assessment & Plan  Problem List Items Addressed This Visit    None    Visit Diagnoses    Other fatigue    -  Primary    Relevant Orders    TSH    CBC w/Diff/Platelet    Sed Rate (ESR)    COMPLETE METABOLIC PANEL WITH GFR    Chronic anticoagulation        Relevant Orders    INR/PT         Dr. Deanna [facility]  11/09/2015

## 2015-11-09 NOTE — Patient Instructions (Signed)
Fatigue  Fatigue is feeling tired all of the time, a lack of energy, or a lack of motivation. Occasional or mild fatigue is often a normal response to activity or life in general. However, long-lasting (chronic) or extreme fatigue may indicate an underlying medical condition.  HOME CARE INSTRUCTIONS   Watch your fatigue for any changes. The following actions may help to lessen any discomfort you are feeling:  · Talk to your health care provider about how much sleep you need each night. Try to get the required amount every night.  · Take medicines only as directed by your health care provider.  · Eat a healthy and nutritious diet. Ask your health care provider if you need help changing your diet.  · Drink enough fluid to keep your urine clear or pale yellow.  · Practice ways of relaxing, such as yoga, meditation, massage therapy, or acupuncture.  · Exercise regularly.    · Change situations that cause you stress. Try to keep your work and personal routine reasonable.  · Do not abuse illegal drugs.  · Limit alcohol intake to no more than 1 drink per day for nonpregnant women and 2 drinks per day for men. One drink equals 12 ounces of beer, 5 ounces of wine, or 1½ ounces of hard liquor.  · Take a multivitamin, if directed by your health care provider.  SEEK MEDICAL CARE IF:   · Your fatigue does not get better.  · You have a fever.    · You have unintentional weight loss or gain.  · You have headaches.    · You have difficulty:      Falling asleep.    Sleeping throughout the night.  · You feel angry, guilty, anxious, or sad.     · You are unable to have a bowel movement (constipation).    · You skin is dry.     · Your legs or another part of your body is swollen.    SEEK IMMEDIATE MEDICAL CARE IF:   · You feel confused.    · Your vision is blurry.  · You feel faint or pass out.    · You have a severe headache.    · You have severe abdominal, pelvic, or back pain.    · You have chest pain, shortness of breath, or an  irregular or fast heartbeat.    · You are unable to urinate or you urinate less than normal.    · You develop abnormal bleeding, such as bleeding from the rectum, vagina, nose, lungs, or nipples.  · You vomit blood.     · You have thoughts about harming yourself or committing suicide.    · You are worried that you might harm someone else.       This information is not intended to replace advice given to you by your health care provider. Make sure you discuss any questions you have with your health care provider.     Document Released: 09/16/2007 Document Revised: 12/10/2014 Document Reviewed: 03/23/2014  Elsevier Interactive Patient Education ©2016 [employer]

## 2015-11-10 LAB — CBC WITH DIFFERENTIAL/PLATELET
Basophils Absolute: 0 10*3/uL (ref 0.0–0.2)
Basos: 1 %
EOS (ABSOLUTE): 0.1 10*3/uL (ref 0.0–0.4)
EOS: 2 %
HEMATOCRIT: 37.6 % (ref 34.0–46.6)
HEMOGLOBIN: 12.4 g/dL (ref 11.1–15.9)
IMMATURE GRANS (ABS): 0 10*3/uL (ref 0.0–0.1)
Immature Granulocytes: 0 %
LYMPHS ABS: 1.1 10*3/uL (ref 0.7–3.1)
LYMPHS: 29 %
MCH: 31.8 pg (ref 26.6–33.0)
MCHC: 33 g/dL (ref 31.5–35.7)
MCV: 96 fL (ref 79–97)
MONOCYTES: 9 %
Monocytes Absolute: 0.3 10*3/uL (ref 0.1–0.9)
NEUTROS ABS: 2.3 10*3/uL (ref 1.4–7.0)
Neutrophils: 59 %
Platelets: 317 10*3/uL (ref 150–379)
RBC: 3.9 x10E6/uL (ref 3.77–5.28)
RDW: 13.4 % (ref 12.3–15.4)
WBC: 3.9 10*3/uL (ref 3.4–10.8)

## 2015-11-10 LAB — CMP14+EGFR
A/G RATIO: 1.5 (ref 1.1–2.5)
ALBUMIN: 4 g/dL (ref 3.6–4.8)
ALT: 24 IU/L (ref 0–32)
AST: 32 IU/L (ref 0–40)
Alkaline Phosphatase: 81 IU/L (ref 39–117)
BILIRUBIN TOTAL: 0.4 mg/dL (ref 0.0–1.2)
BUN / CREAT RATIO: 18 (ref 11–26)
BUN: 20 mg/dL (ref 8–27)
CHLORIDE: 100 mmol/L (ref 97–106)
CO2: 24 mmol/L (ref 18–29)
Calcium: 10 mg/dL (ref 8.7–10.3)
Creatinine, Ser: 1.09 mg/dL — ABNORMAL HIGH (ref 0.57–1.00)
GFR calc non Af Amer: 54 mL/min/{1.73_m2} — ABNORMAL LOW (ref >59)
GFR, EST AFRICAN AMERICAN: 62 mL/min/{1.73_m2} (ref >59)
Globulin, Total: 2.7 g/dL (ref 1.5–4.5)
Glucose: 81 mg/dL (ref 65–99)
POTASSIUM: 4.3 mmol/L (ref 3.5–5.2)
Sodium: 139 mmol/L (ref 136–144)
TOTAL PROTEIN: 6.7 g/dL (ref 6.0–8.5)

## 2015-11-10 LAB — SEDIMENTATION RATE: SED RATE: 33 mm/h (ref 0–40)

## 2015-11-10 LAB — TSH: TSH: 2.26 u[IU]/mL (ref 0.450–4.500)

## 2015-11-10 LAB — PROTIME-INR
INR: 3.6 — ABNORMAL HIGH (ref 0.8–1.2)
PROTHROMBIN TIME: 36.2 s — AB (ref 9.1–12.0)

## 2015-11-10 LAB — CREATINE: Creatine, Serum: 0.7 mg/dL (ref 0.1–1.0)

## 2015-11-11 ENCOUNTER — Other Ambulatory Visit: Payer: Self-pay

## 2015-11-11 DIAGNOSIS — G4730 Sleep apnea, unspecified: Secondary | ICD-10-CM

## 2015-11-29 DIAGNOSIS — H53462 Homonymous bilateral field defects, left side: Secondary | ICD-10-CM | POA: Diagnosis not present

## 2015-12-07 ENCOUNTER — Ambulatory Visit: Payer: Medicare Other | Admitting: Family Medicine

## 2015-12-07 ENCOUNTER — Encounter: Payer: Self-pay | Admitting: Family Medicine

## 2015-12-07 ENCOUNTER — Ambulatory Visit (INDEPENDENT_AMBULATORY_CARE_PROVIDER_SITE_OTHER): Payer: Medicare Other | Admitting: Family Medicine

## 2015-12-07 VITALS — BP 120/80 | HR 70 | Ht 68.0 in | Wt 200.0 lb

## 2015-12-07 DIAGNOSIS — F32A Depression, unspecified: Secondary | ICD-10-CM

## 2015-12-07 DIAGNOSIS — I10 Essential (primary) hypertension: Secondary | ICD-10-CM

## 2015-12-07 DIAGNOSIS — F329 Major depressive disorder, single episode, unspecified: Secondary | ICD-10-CM

## 2015-12-07 DIAGNOSIS — E785 Hyperlipidemia, unspecified: Secondary | ICD-10-CM | POA: Diagnosis not present

## 2015-12-07 DIAGNOSIS — Z7901 Long term (current) use of anticoagulants: Secondary | ICD-10-CM

## 2015-12-07 DIAGNOSIS — E039 Hypothyroidism, unspecified: Secondary | ICD-10-CM

## 2015-12-07 MED ORDER — WARFARIN SODIUM 1 MG PO TABS: 1.0000 mg | ORAL_TABLET | Freq: Two times a day (BID) | ORAL | Status: DC

## 2015-12-07 MED ORDER — RAMIPRIL 2.5 MG PO CAPS
2.5000 mg | ORAL_CAPSULE | Freq: Every day | ORAL | Status: DC
Start: 2015-12-07 — End: 2016-08-04

## 2015-12-07 MED ORDER — ESCITALOPRAM OXALATE 20 MG PO TABS: 20.0000 mg | ORAL_TABLET | Freq: Every day | ORAL | Status: DC

## 2015-12-07 MED ORDER — TRAZODONE HCL 50 MG PO TABS: 50.0000 mg | ORAL_TABLET | Freq: Every day | ORAL | Status: DC

## 2015-12-07 MED ORDER — LEVOTHYROXINE SODIUM 88 MCG PO TABS: 88.0000 ug | ORAL_TABLET | Freq: Every day | ORAL | Status: DC

## 2015-12-07 MED ORDER — WARFARIN SODIUM 3 MG PO TABS
3.0000 mg | ORAL_TABLET | Freq: Every day | ORAL | Status: DC
Start: 2015-12-07 — End: 2016-08-04

## 2015-12-07 NOTE — Progress Notes (Signed)
Name: [name] [facility]   MRN: [id number]    DOB: June 01, 1951   Date:12/07/2015       Progress Note  Subjective  Chief Complaint  Chief Complaint  Patient presents with  . Hypothyroidism  . Hypertension  . Depression  . Cerebrovascular Accident    takes warfarin     Hypertension This is a chronic problem. The current episode started more than 1 year ago. The problem has been gradually improving since onset. The problem is controlled. Pertinent negatives include no anxiety, blurred vision, chest pain, headaches, malaise/fatigue, neck pain, orthopnea, palpitations, peripheral edema, PND, shortness of breath or sweats. There are no associated agents to hypertension. There are no known risk factors for coronary artery disease. Past treatments include ACE inhibitors. The current treatment provides moderate improvement. There are no compliance problems.  There is no history of angina, kidney disease, CAD/MI, CVA, heart failure, left ventricular hypertrophy, PVD, renovascular disease or retinopathy. There is no history of chronic renal disease or a hypertension causing med.  Depression        This is a chronic problem.  The current episode started more than 1 year ago.   The onset quality is undetermined.   The problem has been gradually improving since onset.  Associated symptoms include no decreased concentration, no fatigue, no helplessness, no hopelessness, does not have insomnia, not irritable, no restlessness, no decreased interest, no appetite change, no body aches, no myalgias, no headaches, no indigestion, not sad and no suicidal ideas.     The symptoms are aggravated by nothing.  Past treatments include SSRIs - Selective serotonin reuptake inhibitors and other medications.  Compliance with treatment is good.   Pertinent negatives include no anxiety. Cerebrovascular Accident This is a chronic problem. Pertinent negatives include no abdominal pain, chest pain, chills, coughing, fatigue, fever,  headaches, myalgias, nausea, neck pain, rash or sore throat. Treatments tried: anticoagulation.    No problem-specific assessment & plan notes found for this encounter.   Past Medical History  Diagnosis Date  . Depression   . Hypertension   . Thyroid disease   . Stroke [facility])     Past Surgical History  Procedure Laterality Date  . Cholecystectomy    . Cesarean section    . Knee arthroscopy w/ meniscal repair Left   . Colonoscopy  2012    repeat in 10 yrs    Family History  Problem Relation Age of Onset  . Stroke Father     Social History   Social History  . Marital Status: Married    Spouse Name: N/A  . Number of Children: N/A  . Years of Education: N/A   Occupational History  . Not on file.   Social History Main Topics  . Smoking status: Never Smoker   . Smokeless tobacco: Not on file  . Alcohol Use: No  . Drug Use: No  . Sexual Activity: No   Other Topics Concern  . Not on file   Social History Narrative    Allergies  Allergen Reactions  . Penicillins      Review of Systems  Constitutional: Negative for fever, chills, weight loss, malaise/fatigue, appetite change and fatigue.  HENT: Negative for ear discharge, ear pain and sore throat.   Eyes: Negative for blurred vision.  Respiratory: Negative for cough, sputum production, shortness of breath and wheezing.   Cardiovascular: Negative for chest pain, palpitations, orthopnea, leg swelling and PND.  Gastrointestinal: Negative for heartburn, nausea, abdominal pain, diarrhea, constipation, blood in  stool and melena.  Genitourinary: Negative for dysuria, urgency, frequency and hematuria.  Musculoskeletal: Negative for myalgias, back pain, joint pain and neck pain.  Skin: Negative for rash.  Neurological: Negative for dizziness, tingling, sensory change, focal weakness and headaches.  Endo/Heme/Allergies: Negative for environmental allergies and polydipsia. Does not bruise/bleed easily.   Psychiatric/Behavioral: Positive for depression. Negative for suicidal ideas and decreased concentration. The patient is not nervous/anxious and does not have insomnia.      Objective  Filed Vitals:   12/07/15 1145  BP: 120/80  Pulse: 70  Height: 5\' 8"  (1.727 m)  Weight: 200 lb (90.719 kg)    Physical Exam  Constitutional: She is well-developed, well-nourished, and in no distress. She is not irritable. No distress.  HENT:  Head: Normocephalic and atraumatic.  Right Ear: External ear normal.  Left Ear: External ear normal.  Nose: Nose normal.  Mouth/Throat: Oropharynx is clear and moist.  Eyes: Conjunctivae and EOM are normal. Pupils are equal, round, and reactive to light. Right eye exhibits no discharge. Left eye exhibits no discharge.  Neck: Normal range of motion. Neck supple. No JVD present. No thyromegaly present.  Cardiovascular: Normal rate, regular rhythm, normal heart sounds and intact distal pulses.  Exam reveals no gallop and no friction rub.   No murmur heard. Pulmonary/Chest: Effort normal and breath sounds normal.  Abdominal: Soft. Bowel sounds are normal. She exhibits no mass. There is no tenderness. There is no guarding.  Musculoskeletal: Normal range of motion. She exhibits no edema.  Lymphadenopathy:    She has no cervical adenopathy.  Neurological: She is alert. She has normal reflexes.  Skin: Skin is warm and dry. She is not diaphoretic.  Psychiatric: Mood and affect normal.      Assessment & Plan  Problem List Items Addressed This Visit    None    Visit Diagnoses    Essential hypertension    -  Primary    Relevant Medications    escitalopram (LEXAPRO) 20 MG tablet    ramipril (ALTACE) 2.5 MG capsule    warfarin (COUMADIN) 1 MG tablet    warfarin (COUMADIN) 3 MG tablet    Other Relevant Orders    Renal Function Panel    Depression        Relevant Medications    escitalopram (LEXAPRO) 20 MG tablet    ramipril (ALTACE) 2.5 MG capsule     traZODone (DESYREL) 50 MG tablet    Hypothyroidism, unspecified hypothyroidism type        Relevant Medications    levothyroxine (SYNTHROID, LEVOTHROID) 88 MCG tablet    Other Relevant Orders    TSH    Anticoagulant long-term use        Relevant Medications    warfarin (COUMADIN) 1 MG tablet    warfarin (COUMADIN) 3 MG tablet    Hyperlipidemia        Relevant Medications    ramipril (ALTACE) 2.5 MG capsule    warfarin (COUMADIN) 1 MG tablet    warfarin (COUMADIN) 3 MG tablet    Other Relevant Orders    Lipid Profile         Dr. Otilio Miu [facility]  12/07/2015

## 2015-12-20 DIAGNOSIS — R05 Cough: Secondary | ICD-10-CM | POA: Diagnosis not present

## 2015-12-20 DIAGNOSIS — G479 Sleep disorder, unspecified: Secondary | ICD-10-CM | POA: Diagnosis not present

## 2015-12-20 DIAGNOSIS — R0981 Nasal congestion: Secondary | ICD-10-CM | POA: Diagnosis not present

## 2015-12-20 DIAGNOSIS — K219 Gastro-esophageal reflux disease without esophagitis: Secondary | ICD-10-CM | POA: Diagnosis not present

## 2015-12-28 DIAGNOSIS — Z01419 Encounter for gynecological examination (general) (routine) without abnormal findings: Secondary | ICD-10-CM | POA: Diagnosis not present

## 2015-12-30 ENCOUNTER — Other Ambulatory Visit: Payer: Self-pay

## 2015-12-30 ENCOUNTER — Other Ambulatory Visit: Payer: Self-pay | Admitting: Nurse Practitioner

## 2015-12-30 DIAGNOSIS — Z1231 Encounter for screening mammogram for malignant neoplasm of breast: Secondary | ICD-10-CM

## 2016-01-03 ENCOUNTER — Ambulatory Visit
Admission: RE | Admit: 2016-01-03 | Discharge: 2016-01-03 | Disposition: A | Discharge status: Home / Self Care | Payer: Medicare Other | Source: Ambulatory Visit | Attending: Nurse Practitioner | Admitting: Nurse Practitioner

## 2016-01-03 DIAGNOSIS — Z1231 Encounter for screening mammogram for malignant neoplasm of breast: Secondary | ICD-10-CM | POA: Diagnosis not present

## 2016-01-03 IMAGING — MG MM DIGITAL SCREENING BILAT W/ CAD
1 series · 4 of 4 positions shown · non-contrast
Comparison: Previous exam(s).

CLINICAL DATA: Screening.

EXAM:
DIGITAL SCREENING BILATERAL MAMMOGRAM WITH CAD

[R CC · right · 4 of 4 slices shown]
[im 1/4]
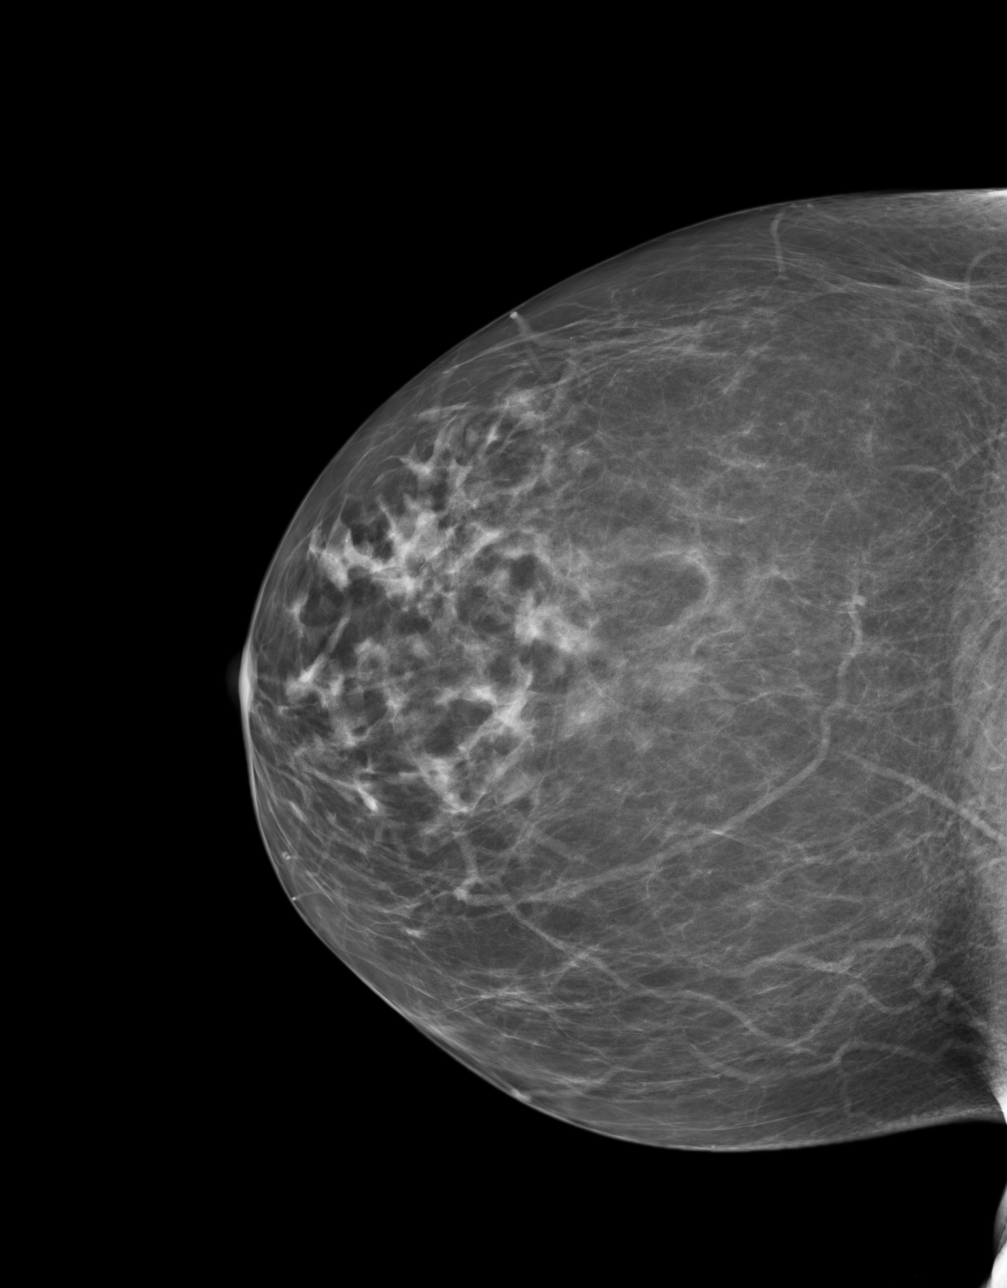
[im 2/4]
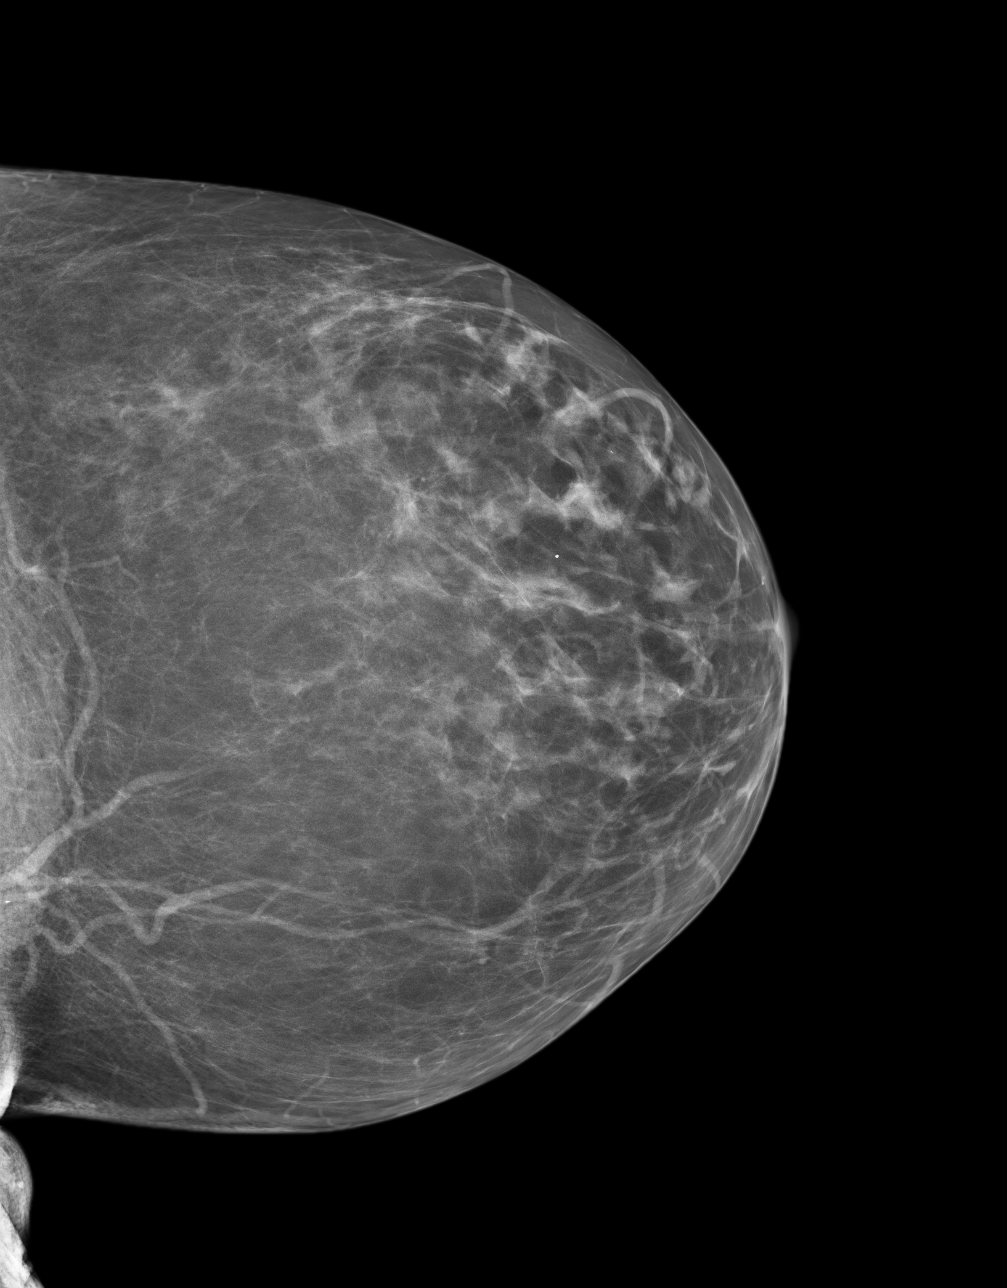
[im 3/4]
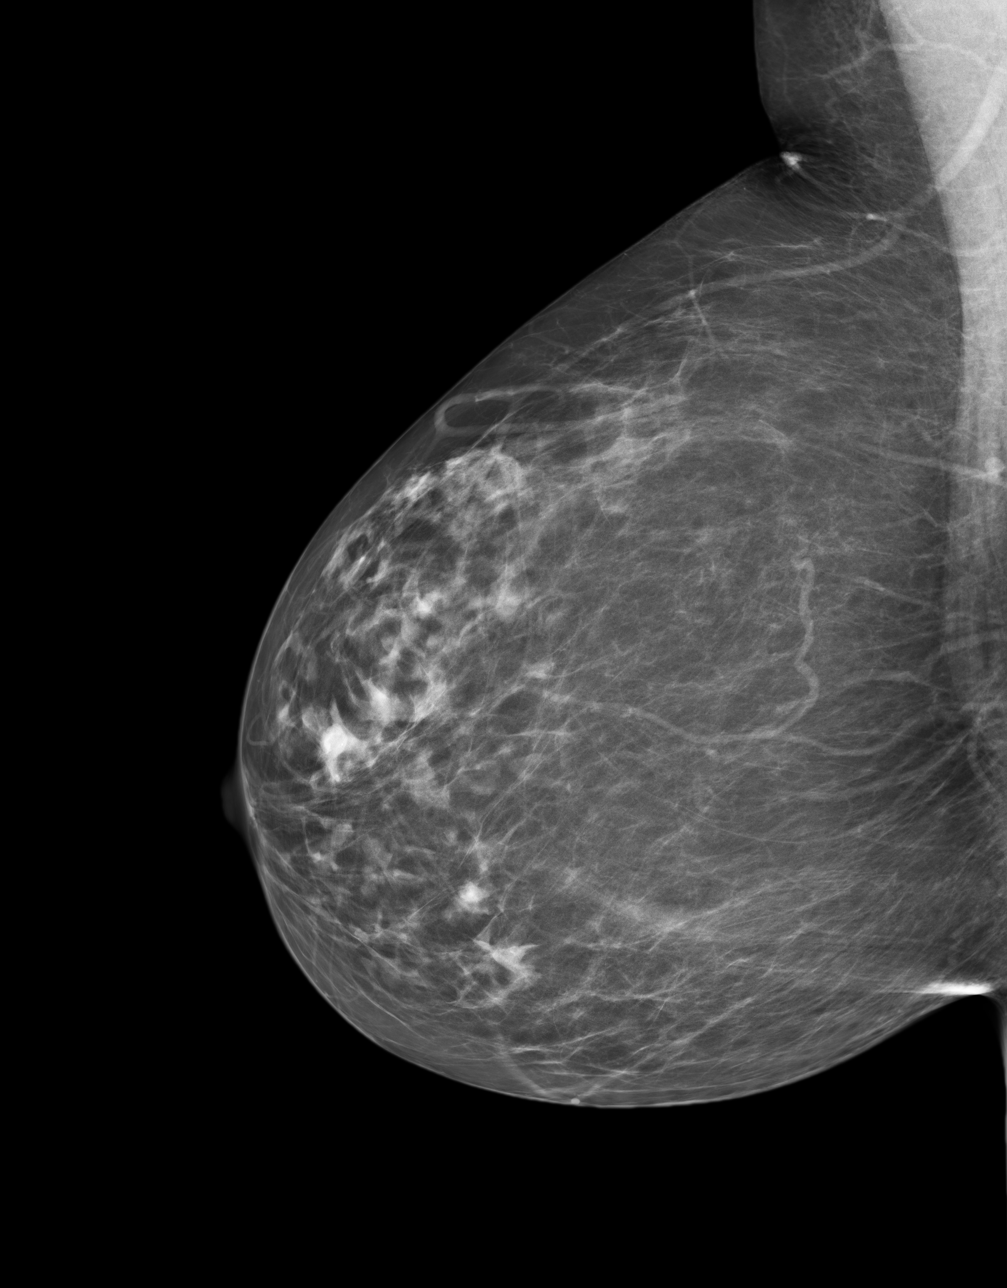
[im 4/4]
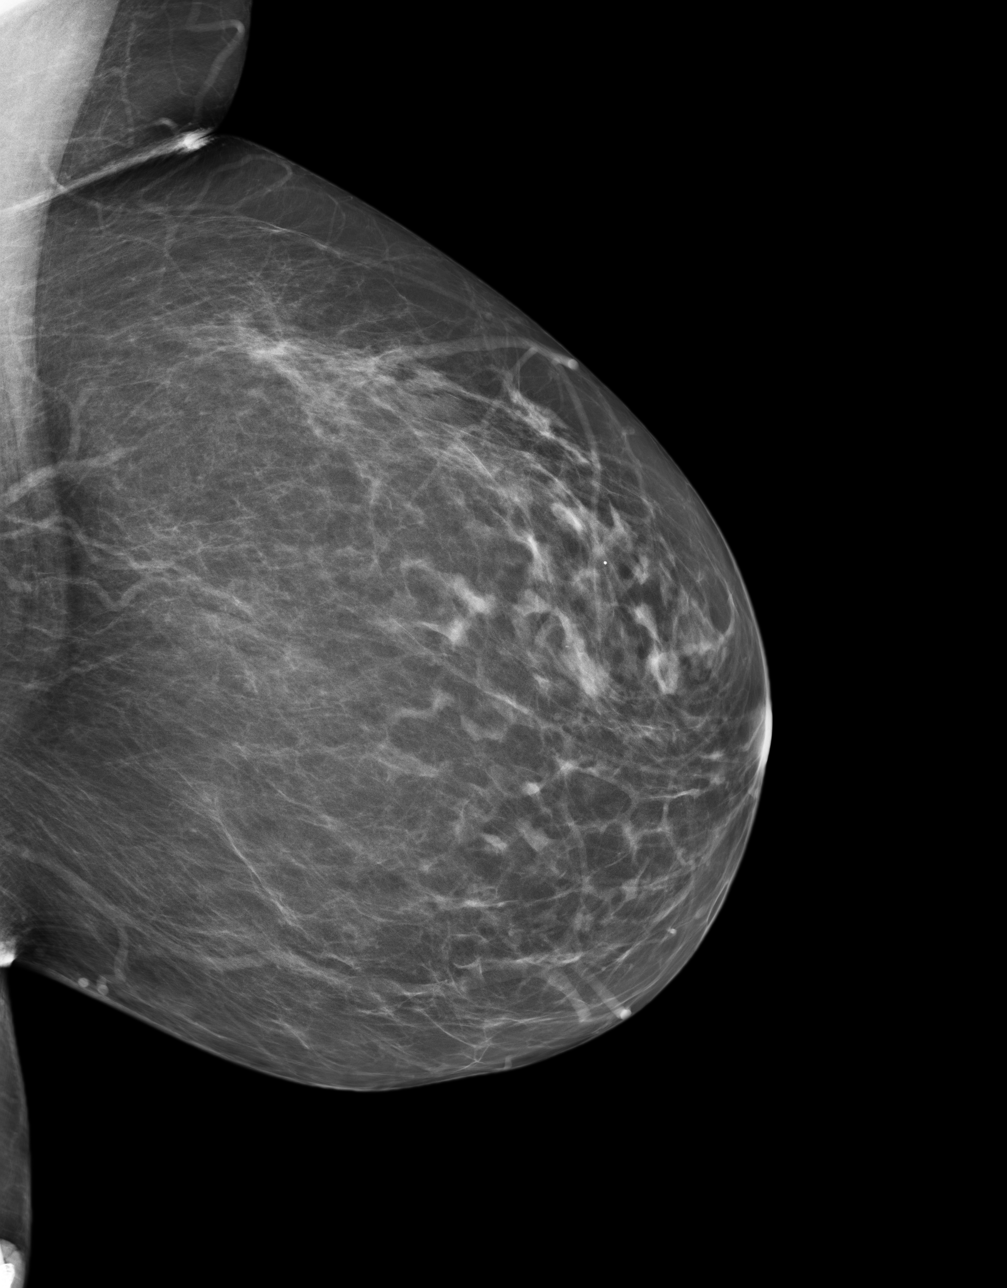

[4 of 4 positions shown; findings below may reference images not displayed]

ACR Breast Density Category b: There are scattered areas of
fibroglandular density.
FINDINGS: There are no findings suspicious for malignancy. Images were
processed with CAD.
IMPRESSION: No mammographic evidence of malignancy. A result letter of this
screening mammogram will be mailed directly to the patient.

RECOMMENDATION:
Screening mammogram in one year. (Code:[[place]])

BI-RADS CATEGORY  1: Negative.

## 2016-02-23 ENCOUNTER — Other Ambulatory Visit: Payer: Self-pay

## 2016-02-23 ENCOUNTER — Other Ambulatory Visit: Payer: Medicare Other

## 2016-02-23 DIAGNOSIS — Z7901 Long term (current) use of anticoagulants: Secondary | ICD-10-CM

## 2016-02-24 LAB — PROTIME-INR
INR: 3.7 — AB (ref 0.8–1.2)
PROTHROMBIN TIME: 37.9 s — AB (ref 9.1–12.0)

## 2016-03-14 ENCOUNTER — Ambulatory Visit
Admission: RE | Admit: 2016-03-14 | Discharge: 2016-03-14 | Disposition: A | Discharge status: Home / Self Care | Payer: Medicare Other | Source: Ambulatory Visit | Attending: Family Medicine | Admitting: Family Medicine

## 2016-03-14 ENCOUNTER — Ambulatory Visit (INDEPENDENT_AMBULATORY_CARE_PROVIDER_SITE_OTHER): Payer: Medicare Other | Admitting: Family Medicine

## 2016-03-14 ENCOUNTER — Encounter: Payer: Self-pay | Admitting: Family Medicine

## 2016-03-14 VITALS — BP 120/80 | HR 78 | Ht 68.0 in | Wt 190.0 lb

## 2016-03-14 DIAGNOSIS — S60351A Superficial foreign body of right thumb, initial encounter: Secondary | ICD-10-CM | POA: Insufficient documentation

## 2016-03-14 DIAGNOSIS — X58XXXA Exposure to other specified factors, initial encounter: Secondary | ICD-10-CM | POA: Diagnosis not present

## 2016-03-14 IMAGING — CR DG FINGER THUMB 2+V*R*
3 series · 3 of 3 positions shown · non-contrast
Comparison: None in PACs

CLINICAL DATA: Read tender area over the anterior aspect of the pad
of the thumb the base of the distal phalanx for the past 3 weeks. No
known injury

EXAM:
RIGHT THUMB 2+V

[finger ap]
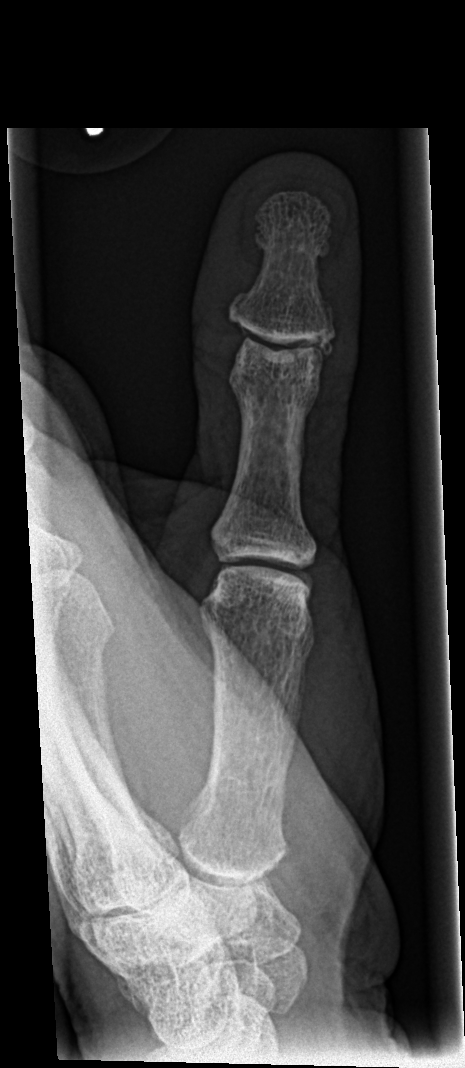

[finger obl]
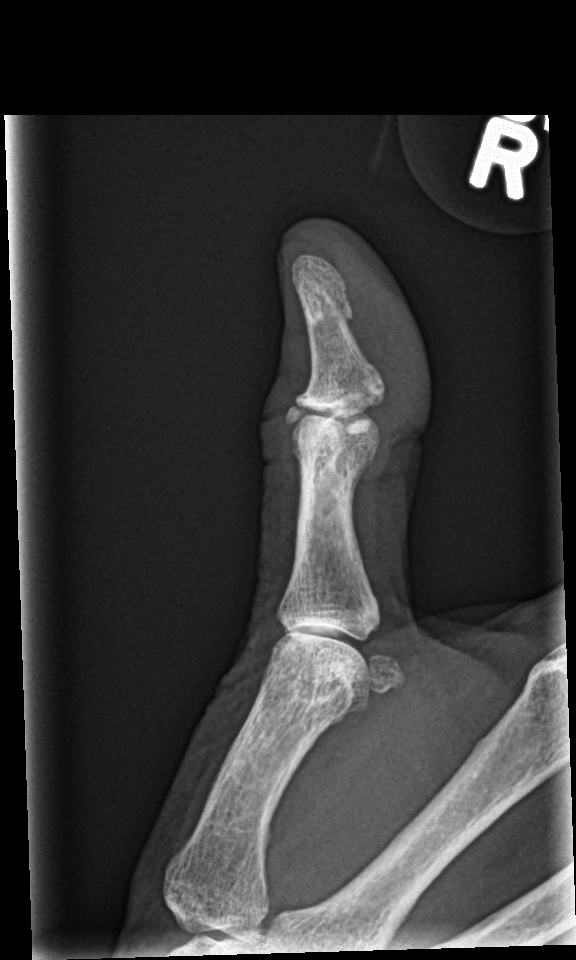

[finger lat]
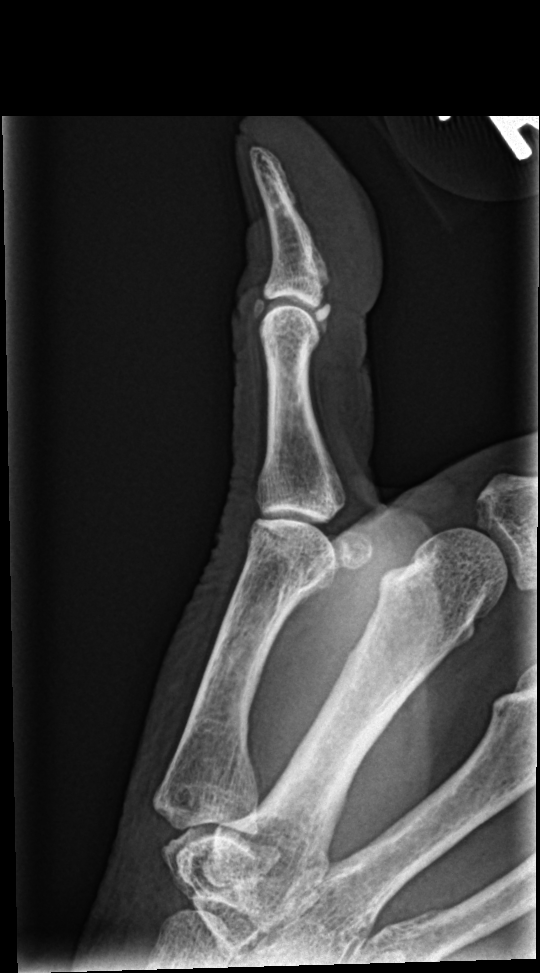

[3 of 3 positions shown; findings below may reference images not displayed]

FINDINGS: The bones of the right thumb are adequately mineralized. The joint
spaces are preserved. No radiopaque foreign material is observed
within the pad of the thumb. No definite soft tissue gas collections
are observed either.
IMPRESSION: No acute bony abnormality is observed. No radiopaque foreign body is
evident.

## 2016-03-14 MED ORDER — SULFAMETHOXAZOLE-TRIMETHOPRIM 800-160 MG PO TABS: 1.0000 | ORAL_TABLET | Freq: Two times a day (BID) | ORAL | Status: DC

## 2016-03-14 MED ORDER — TRIAMCINOLONE ACETONIDE 0.1 % EX CREA: 1.0000 "application " | TOPICAL_CREAM | Freq: Two times a day (BID) | CUTANEOUS | Status: DC

## 2016-03-14 NOTE — Progress Notes (Signed)
Name: [name] [facility]   MRN: [id number]    DOB: 10/18/1951   Date:03/14/2016       Progress Note  Subjective  Chief Complaint  Chief Complaint  Patient presents with  . thumb pain    R) thumb- hurting and red    HPI  No problem-specific assessment & plan notes found for this encounter.   Past Medical History  Diagnosis Date  . Depression   . Hypertension   . Thyroid disease   . Stroke [facility])     Past Surgical History  Procedure Laterality Date  . Cholecystectomy    . Cesarean section    . Knee arthroscopy w/ meniscal repair Left   . Colonoscopy  2012    repeat in 10 yrs    Family History  Problem Relation Age of Onset  . Stroke Father   . Breast cancer Maternal Grandmother     Social History   Social History  . Marital Status: Married    Spouse Name: N/A  . Number of Children: N/A  . Years of Education: N/A   Occupational History  . Not on file.   Social History Main Topics  . Smoking status: Never Smoker   . Smokeless tobacco: Not on file  . Alcohol Use: No  . Drug Use: No  . Sexual Activity: No   Other Topics Concern  . Not on file   Social History Narrative    Allergies  Allergen Reactions  . Penicillins      Review of Systems  Constitutional: Negative for fever, chills, weight loss and malaise/fatigue.  HENT: Negative for ear discharge, ear pain and sore throat.   Eyes: Negative for blurred vision.  Respiratory: Negative for cough, sputum production, shortness of breath and wheezing.   Cardiovascular: Negative for chest pain, palpitations and leg swelling.  Gastrointestinal: Negative for heartburn, nausea, abdominal pain, diarrhea, constipation, blood in stool and melena.  Genitourinary: Negative for dysuria, urgency, frequency and hematuria.  Musculoskeletal: Negative for myalgias, back pain, joint pain and neck pain.  Skin: Negative for rash.  Neurological: Negative for dizziness, tingling, sensory change, focal weakness and  headaches.  Endo/Heme/Allergies: Negative for environmental allergies and polydipsia. Does not bruise/bleed easily.  Psychiatric/Behavioral: Negative for depression and suicidal ideas. The patient is not nervous/anxious and does not have insomnia.      Objective  Filed Vitals:   03/14/16 0940  BP: 120/80  Pulse: 78  Height: 5\' 8"  (1.727 m)  Weight: 190 lb (86.183 kg)    Physical Exam  Constitutional: She is well-developed, well-nourished, and in no distress. No distress.  HENT:  Head: Normocephalic and atraumatic.  Right Ear: External ear normal.  Left Ear: External ear normal.  Nose: Nose normal.  Mouth/Throat: Oropharynx is clear and moist.  Eyes: Conjunctivae and EOM are normal. Pupils are equal, round, and reactive to light. Right eye exhibits no discharge. Left eye exhibits no discharge.  Neck: Normal range of motion. Neck supple. No JVD present. No thyromegaly present.  Cardiovascular: Normal rate, regular rhythm, normal heart sounds and intact distal pulses.  Exam reveals no gallop and no friction rub.   No murmur heard. Pulmonary/Chest: Effort normal and breath sounds normal.  Abdominal: Soft. Bowel sounds are normal. She exhibits no mass. There is no tenderness. There is no guarding.  Musculoskeletal: Normal range of motion. She exhibits no edema.  Lymphadenopathy:    She has no cervical adenopathy.  Neurological: She is alert.  Skin: Skin is warm and dry.  She is not diaphoretic. There is erythema.  Thumb right  Psychiatric: Mood and affect normal.  Nursing note and vitals reviewed.     Assessment & Plan  Problem List Items Addressed This Visit    None    Visit Diagnoses    Foreign body of right thumb    -  Primary    Relevant Medications    sulfamethoxazole-trimethoprim (BACTRIM DS,SEPTRA DS) 800-160 MG tablet    triamcinolone cream (KENALOG) 0.1 %    Other Relevant Orders    DG Finger Thumb Right         Dr. Otilio Miu [facility]  03/14/2016

## 2016-03-17 ENCOUNTER — Ambulatory Visit
Admission: EM | Admit: 2016-03-17 | Discharge: 2016-03-17 | Disposition: A | Discharge status: Home / Self Care | Payer: Medicare Other | Attending: Family Medicine | Admitting: Family Medicine

## 2016-03-17 DIAGNOSIS — R21 Rash and other nonspecific skin eruption: Secondary | ICD-10-CM | POA: Diagnosis not present

## 2016-03-17 MED ORDER — DOXYCYCLINE HYCLATE 100 MG PO CAPS: 100.0000 mg | ORAL_CAPSULE | Freq: Two times a day (BID) | ORAL | Status: DC

## 2016-03-17 NOTE — ED Provider Notes (Signed)
CSN: [id number]     Arrival date & time 03/17/16  1423 History   First MD Initiated Contact with Patient 03/17/16 1530     Chief Complaint  Patient presents with  . Skin Discoloration    Pt started a few hours ago with sudden onset redness to left foot-mid calf and right lower leg. No pain, no itching. Started Bactrim a few days ago for finger infection.    (Consider location/radiation/quality/duration/timing/severity/associated sxs/prior Treatment) HPI: Patient presents today with symptoms of left foot extending to mid calf redness and warmth. Patient also noticed similar rash on her right medial ankle. She states that she was on Bactrim a few days ago for possible skin infection of her thumb. She has no discomfort or redness of the thumb any more. She stopped the Bactrim after 5 days of use. She denies any previous reaction to sulfa drugs in the past. She denies any itching or pain from this rash. She does have a few scratches on her lower legs from a dog that was at her home. She denies any vesicular lesions to the lower extremities. She denies any other new medications that she has recently started. She has been on Coumadin for years and states that her INR is always been normal. She denies any calf swelling or tenderness. She states she is on the Coumadin for history of previous stroke.  Past Medical History  Diagnosis Date  . Depression   . Hypertension   . Thyroid disease   . Stroke [facility])    Past Surgical History  Procedure Laterality Date  . Cholecystectomy    . Cesarean section    . Knee arthroscopy w/ meniscal repair Left   . Colonoscopy  2012    repeat in 10 yrs   Family History  Problem Relation Age of Onset  . Stroke Father   . Breast cancer Maternal Grandmother    Social History  Substance Use Topics  . Smoking status: Never Smoker   . Smokeless tobacco: None  . Alcohol Use: No   OB History    No data available     Review of Systems: Negative except mentioned  above.   Allergies  Penicillins  Home Medications   Prior to Admission medications   Medication Sig Start Date End Date Taking? Authorizing Provider  escitalopram (LEXAPRO) 20 MG tablet Take 1 tablet (20 mg total) by mouth daily. 12/07/15  Yes Juline Patch, MD  levothyroxine (SYNTHROID, LEVOTHROID) 88 MCG tablet Take 1 tablet (88 mcg total) by mouth daily. 12/07/15  Yes Juline Patch, MD  ramipril (ALTACE) 2.5 MG capsule Take 1 capsule (2.5 mg total) by mouth daily. 12/07/15  Yes Juline Patch, MD  sulfamethoxazole-trimethoprim (BACTRIM DS,SEPTRA DS) 800-160 MG tablet Take 1 tablet by mouth 2 (two) times daily. 03/14/16  Yes Juline Patch, MD  traZODone (DESYREL) 50 MG tablet Take 1 tablet (50 mg total) by mouth daily. 12/07/15  Yes Juline Patch, MD  triamcinolone cream (KENALOG) 0.1 % Apply 1 application topically 2 (two) times daily. 03/14/16  Yes Juline Patch, MD  warfarin (COUMADIN) 1 MG tablet Take 1 tablet (1 mg total) by mouth 2 (two) times daily. 12/07/15  Yes Juline Patch, MD  warfarin (COUMADIN) 3 MG tablet Take 1 tablet (3 mg total) by mouth daily. 12/07/15  Yes Juline Patch, MD  doxycycline (VIBRAMYCIN) 100 MG capsule Take 1 capsule (100 mg total) by mouth 2 (two) times daily. 03/17/16   Paulina Fusi,  MD   Meds Ordered and Administered this Visit  Medications - No data to display  BP 112/66 mmHg  Pulse 82  Temp(Src) 98 F (36.7 C) (Oral)  Resp 18  Ht 5\' 8"  (1.727 m)  Wt 180 lb (81.647 kg)  BMI 27.38 kg/m2  SpO2 97% No data found.   Physical Exam   GENERAL: NAD RESP: CTA B CARD: RRR EXTREM: mild to moderate erythema and warmth of left lower extremity extending from mid lower leg to ankle, similar area along right medial ankle, no vesicular lesions, no tenderness appreciated, FROM, -Hom[place], nv intact, there are a few excoriations in the lower extremity region bilaterally from dog, no acute bleeding from the sites    ED Course  Procedures (including critical care  time)  Labs Review Labs Reviewed - No data to display  Imaging Review No results found.    MDM   1. Skin rash   Unclear of etiology, could be related to cellulitic process will treat with doxycycline and have patient follow-up in 1-2 days. If symptoms do worsen I have recommended that she go to the ER. Encourage patient to wash the excoriations on lower extremities with antibacterial soap and keep the areas dry. Patient left before her INR could be checked. I would recommend that her INR be checked at her next visit here.    Paulina Fusi, MD 03/17/16 [phone]

## 2016-07-09 ENCOUNTER — Other Ambulatory Visit: Payer: Self-pay | Admitting: Family Medicine

## 2016-07-09 DIAGNOSIS — E039 Hypothyroidism, unspecified: Secondary | ICD-10-CM

## 2016-07-19 ENCOUNTER — Other Ambulatory Visit: Payer: Medicare Other

## 2016-07-19 DIAGNOSIS — Z7901 Long term (current) use of anticoagulants: Secondary | ICD-10-CM

## 2016-07-24 DIAGNOSIS — Z7901 Long term (current) use of anticoagulants: Secondary | ICD-10-CM | POA: Diagnosis not present

## 2016-07-25 LAB — PROTIME-INR
INR: 3.5 — ABNORMAL HIGH (ref 0.8–1.2)
Prothrombin Time: 33.9 s — ABNORMAL HIGH (ref 9.1–12.0)

## 2016-08-04 ENCOUNTER — Other Ambulatory Visit: Payer: Self-pay | Admitting: Family Medicine

## 2016-08-04 DIAGNOSIS — I10 Essential (primary) hypertension: Secondary | ICD-10-CM

## 2016-08-04 DIAGNOSIS — F329 Major depressive disorder, single episode, unspecified: Secondary | ICD-10-CM

## 2016-08-04 DIAGNOSIS — F32A Depression, unspecified: Secondary | ICD-10-CM

## 2016-08-04 DIAGNOSIS — Z7901 Long term (current) use of anticoagulants: Secondary | ICD-10-CM

## 2016-08-20 DIAGNOSIS — Z23 Encounter for immunization: Secondary | ICD-10-CM | POA: Diagnosis not present

## 2016-09-04 ENCOUNTER — Other Ambulatory Visit: Payer: Medicare Other

## 2016-09-04 DIAGNOSIS — Z7901 Long term (current) use of anticoagulants: Secondary | ICD-10-CM

## 2016-09-05 ENCOUNTER — Other Ambulatory Visit: Payer: Self-pay | Admitting: Family Medicine

## 2016-09-05 DIAGNOSIS — I10 Essential (primary) hypertension: Secondary | ICD-10-CM

## 2016-09-05 DIAGNOSIS — F329 Major depressive disorder, single episode, unspecified: Secondary | ICD-10-CM

## 2016-09-05 DIAGNOSIS — F32A Depression, unspecified: Secondary | ICD-10-CM

## 2016-09-05 DIAGNOSIS — M170 Bilateral primary osteoarthritis of knee: Secondary | ICD-10-CM | POA: Diagnosis not present

## 2016-09-05 LAB — PROTIME-INR
INR: 2.2 — ABNORMAL HIGH (ref 0.8–1.2)
Prothrombin Time: 22 s — ABNORMAL HIGH (ref 9.1–12.0)

## 2016-09-06 ENCOUNTER — Other Ambulatory Visit: Payer: Self-pay | Admitting: Family Medicine

## 2016-09-06 DIAGNOSIS — E039 Hypothyroidism, unspecified: Secondary | ICD-10-CM

## 2016-09-06 DIAGNOSIS — Z7901 Long term (current) use of anticoagulants: Secondary | ICD-10-CM

## 2016-10-07 ENCOUNTER — Other Ambulatory Visit: Payer: Self-pay | Admitting: Family Medicine

## 2016-10-07 DIAGNOSIS — F32A Depression, unspecified: Secondary | ICD-10-CM

## 2016-10-07 DIAGNOSIS — E039 Hypothyroidism, unspecified: Secondary | ICD-10-CM

## 2016-10-07 DIAGNOSIS — F329 Major depressive disorder, single episode, unspecified: Secondary | ICD-10-CM

## 2016-10-23 ENCOUNTER — Ambulatory Visit (INDEPENDENT_AMBULATORY_CARE_PROVIDER_SITE_OTHER): Payer: Medicare Other | Admitting: Family Medicine

## 2016-10-23 ENCOUNTER — Encounter: Payer: Self-pay | Admitting: Family Medicine

## 2016-10-23 VITALS — BP 128/82 | HR 72 | Ht 68.0 in | Wt 196.0 lb

## 2016-10-23 DIAGNOSIS — F3342 Major depressive disorder, recurrent, in full remission: Secondary | ICD-10-CM | POA: Insufficient documentation

## 2016-10-23 DIAGNOSIS — K219 Gastro-esophageal reflux disease without esophagitis: Secondary | ICD-10-CM | POA: Diagnosis not present

## 2016-10-23 DIAGNOSIS — E039 Hypothyroidism, unspecified: Secondary | ICD-10-CM | POA: Diagnosis not present

## 2016-10-23 DIAGNOSIS — Z7901 Long term (current) use of anticoagulants: Secondary | ICD-10-CM | POA: Diagnosis not present

## 2016-10-23 DIAGNOSIS — I10 Essential (primary) hypertension: Secondary | ICD-10-CM

## 2016-10-23 MED ORDER — ESCITALOPRAM OXALATE 20 MG PO TABS: 20.0000 mg | ORAL_TABLET | Freq: Every day | ORAL | 2 refills | Status: DC

## 2016-10-23 MED ORDER — RAMIPRIL 2.5 MG PO CAPS: 2.5000 mg | ORAL_CAPSULE | Freq: Every day | ORAL | 2 refills | Status: DC

## 2016-10-23 MED ORDER — PANTOPRAZOLE SODIUM 40 MG PO TBEC: 40.0000 mg | DELAYED_RELEASE_TABLET | Freq: Every day | ORAL | 2 refills | Status: DC

## 2016-10-23 MED ORDER — TRAZODONE HCL 50 MG PO TABS: 50.0000 mg | ORAL_TABLET | Freq: Every day | ORAL | 2 refills | Status: DC

## 2016-10-23 MED ORDER — LEVOTHYROXINE SODIUM 88 MCG PO TABS: 88.0000 ug | ORAL_TABLET | Freq: Every day | ORAL | 2 refills | Status: DC

## 2016-10-23 NOTE — Progress Notes (Signed)
Name: [name] [facility]   MRN: [id number]    DOB: [birth year]   Date:10/23/2016       Progress Note  Subjective  Chief Complaint  Chief Complaint  Patient presents with  . Depression  . Hypertension  . Hypothyroidism  . Gastroesophageal Reflux    bends over and can feel acid coming up into throat- has tried watching what she eats and Mylanta- not helping    Depression         This is a chronic problem.  The current episode started more than 1 year ago.   The onset quality is gradual.   The problem occurs intermittently.  The problem has been waxing and waning since onset.  Associated symptoms include no decreased concentration, no fatigue, no helplessness, no hopelessness, does not have insomnia, not irritable, no restlessness, no decreased interest, no appetite change, no body aches, no myalgias, no headaches, no indigestion, not sad and no suicidal ideas.  Past treatments include SSRIs - Selective serotonin reuptake inhibitors.  Compliance with treatment is good.  Previous treatment provided mild relief.  Risk factors include menopause.   Past medical history includes thyroid problem.     Pertinent negatives include no anxiety. Hypertension  This is a chronic problem. The current episode started more than 1 year ago. The problem has been gradually improving since onset. The problem is controlled. Pertinent negatives include no anxiety, blurred vision, chest pain, headaches, malaise/fatigue, neck pain, orthopnea, palpitations, peripheral edema, PND, shortness of breath or sweats. There are no associated agents to hypertension. There are no known risk factors for coronary artery disease. Past treatments include ACE inhibitors. The current treatment provides moderate improvement. There are no compliance problems.  Hypertensive end-organ damage includes a thyroid problem. There is no history of angina, kidney disease, CAD/MI, CVA, heart failure, left ventricular hypertrophy, PVD, renovascular  disease or retinopathy. There is no history of chronic renal disease or a hypertension causing med.  Gastroesophageal Reflux  She complains of heartburn. She reports no abdominal pain, no belching, no chest pain, no choking, no coughing, no dysphagia, no early satiety, no globus sensation, no hoarse voice, no nausea, no sore throat, no stridor or no wheezing. This is a new problem. The current episode started 1 to 4 weeks ago. The problem occurs frequently. The problem has been waxing and waning. The heartburn is located in the substernum. The heartburn is of mild intensity. Pertinent negatives include no anemia, fatigue, melena, muscle weakness, orthopnea or weight loss. There are no known risk factors. She has tried an antacid for the symptoms. The treatment provided moderate relief.  Thyroid Problem  Presents for follow-up visit. Symptoms include depressed mood. Patient reports no anxiety, cold intolerance, constipation, diaphoresis, diarrhea, dry skin, fatigue, hair loss, heat intolerance, hoarse voice, leg swelling, nail problem, palpitations, tremors, visual change, weight gain or weight loss. The symptoms have been stable. There is no history of heart failure.    No problem-specific Assessment & Plan notes found for this encounter.   Past Medical History:  Diagnosis Date  . Depression   . Hypertension   . Stroke ([place])   . Thyroid disease     Past Surgical History:  Procedure Laterality Date  . CESAREAN SECTION    . CHOLECYSTECTOMY    . COLONOSCOPY  2012   repeat in 10 yrs  . KNEE ARTHROSCOPY W/ MENISCAL REPAIR Left     Family History  Problem Relation Age of Onset  . Stroke Father   .  Breast cancer Maternal Grandmother     Social History   Social History  . Marital status: Married    Spouse name: N/A  . Number of children: N/A  . Years of education: N/A   Occupational History  . Not on file.   Social History Main Topics  . Smoking status: Never Smoker  . Smokeless  tobacco: Not on file  . Alcohol use No  . Drug use: No  . Sexual activity: No   Other Topics Concern  . Not on file   Social History Narrative  . No narrative on file    Allergies  Allergen Reactions  . Penicillins      Review of Systems  Constitutional: Negative for appetite change, chills, diaphoresis, fatigue, fever, malaise/fatigue, weight gain and weight loss.  HENT: Negative for ear discharge, ear pain, hoarse voice and sore throat.   Eyes: Negative for blurred vision.  Respiratory: Negative for cough, sputum production, choking, shortness of breath and wheezing.   Cardiovascular: Negative for chest pain, palpitations, orthopnea, leg swelling and PND.  Gastrointestinal: Positive for heartburn. Negative for abdominal pain, blood in stool, constipation, diarrhea, dysphagia, melena and nausea.  Genitourinary: Negative for dysuria, frequency, hematuria and urgency.  Musculoskeletal: Negative for back pain, joint pain, myalgias, muscle weakness and neck pain.  Skin: Negative for rash.  Neurological: Negative for dizziness, tingling, tremors, sensory change, focal weakness and headaches.  Endo/Heme/Allergies: Negative for environmental allergies, cold intolerance, heat intolerance and polydipsia. Does not bruise/bleed easily.  Psychiatric/Behavioral: Positive for depression. Negative for decreased concentration and suicidal ideas. The patient is not nervous/anxious and does not have insomnia.      Objective  Vitals:   10/23/16 1405  BP: 128/82  Pulse: 72  Weight: 196 lb (88.9 kg)  Height: 5\' 8"  (1.727 m)    Physical Exam  Constitutional: She is well-developed, well-nourished, and in no distress. She is not irritable. No distress.  HENT:  Head: Normocephalic and atraumatic.  Right Ear: External ear normal.  Left Ear: External ear normal.  Nose: Nose normal.  Mouth/Throat: Oropharynx is clear and moist.  Eyes: Conjunctivae and EOM are normal. Pupils are equal, round,  and reactive to light. Right eye exhibits no discharge. Left eye exhibits no discharge.  Neck: Normal range of motion. Neck supple. No JVD present. No thyromegaly present.  Cardiovascular: Normal rate, regular rhythm, normal heart sounds and intact distal pulses.  Exam reveals no gallop and no friction rub.   No murmur heard. Pulmonary/Chest: Effort normal and breath sounds normal. She has no wheezes. She has no rales.  Abdominal: Soft. Bowel sounds are normal. She exhibits no mass. There is no tenderness. There is no guarding.  Genitourinary: Right adnexa normal, left adnexa normal and vulva normal.  Musculoskeletal: Normal range of motion. She exhibits no edema.  Lymphadenopathy:    She has no cervical adenopathy.  Neurological: She is alert. She has normal reflexes.  Skin: Skin is warm and dry. No rash noted. She is not diaphoretic.  Psychiatric: Mood and affect normal.  Nursing note and vitals reviewed.     Assessment & Plan  Problem List Items Addressed This Visit      Cardiovascular and Mediastinum   Essential hypertension - Primary   Relevant Medications   ramipril (ALTACE) 2.5 MG capsule   escitalopram (LEXAPRO) 20 MG tablet   Other Relevant Orders   Renal Function Panel     Digestive   Gastroesophageal reflux disease without esophagitis   Relevant Medications  pantoprazole (PROTONIX) 40 MG tablet     Endocrine   Adult hypothyroidism   Relevant Medications   levothyroxine (SYNTHROID, LEVOTHROID) 88 MCG tablet   Other Relevant Orders   TSH     Other   Recurrent major depressive disorder, in full remission (HCC)   Relevant Medications   ramipril (ALTACE) 2.5 MG capsule   traZODone (DESYREL) 50 MG tablet   escitalopram (LEXAPRO) 20 MG tablet   Anticoagulant long-term use   Relevant Orders   INR/PT     I spent 25 minutes with this patient, More than 50% of that time was spent in face to face education, counseling and care coordination.   Dr. [facility]  10/23/16

## 2016-10-24 DIAGNOSIS — M2041 Other hammer toe(s) (acquired), right foot: Secondary | ICD-10-CM | POA: Diagnosis not present

## 2016-10-24 DIAGNOSIS — M2042 Other hammer toe(s) (acquired), left foot: Secondary | ICD-10-CM | POA: Diagnosis not present

## 2016-10-24 LAB — PROTIME-INR
INR: 5.1 — AB (ref 0.8–1.2)
PROTHROMBIN TIME: 48.4 s — AB (ref 9.1–12.0)

## 2016-10-24 LAB — RENAL FUNCTION PANEL
ALBUMIN: 4.3 g/dL (ref 3.6–4.8)
BUN/Creatinine Ratio: 16 (ref 12–28)
BUN: 18 mg/dL (ref 8–27)
CALCIUM: 9.6 mg/dL (ref 8.7–10.3)
CO2: 26 mmol/L (ref 18–29)
Chloride: 100 mmol/L (ref 96–106)
Creatinine, Ser: 1.12 mg/dL — ABNORMAL HIGH (ref 0.57–1.00)
GFR calc Af Amer: 60 mL/min/{1.73_m2} (ref >59)
GFR, EST NON AFRICAN AMERICAN: 52 mL/min/{1.73_m2} — AB (ref >59)
GLUCOSE: 81 mg/dL (ref 65–99)
PHOSPHORUS: 2.9 mg/dL (ref 2.5–4.5)
POTASSIUM: 4.4 mmol/L (ref 3.5–5.2)
Sodium: 140 mmol/L (ref 134–144)

## 2016-10-24 LAB — TSH: TSH: 1.48 u[IU]/mL (ref 0.450–4.500)

## 2016-11-09 ENCOUNTER — Other Ambulatory Visit: Payer: Self-pay | Admitting: Family Medicine

## 2016-11-09 DIAGNOSIS — F32A Depression, unspecified: Secondary | ICD-10-CM

## 2016-11-09 DIAGNOSIS — I10 Essential (primary) hypertension: Secondary | ICD-10-CM

## 2016-11-09 DIAGNOSIS — F329 Major depressive disorder, single episode, unspecified: Secondary | ICD-10-CM

## 2016-11-12 ENCOUNTER — Other Ambulatory Visit (INDEPENDENT_AMBULATORY_CARE_PROVIDER_SITE_OTHER): Payer: Medicare Other

## 2016-11-12 DIAGNOSIS — Z23 Encounter for immunization: Secondary | ICD-10-CM | POA: Diagnosis not present

## 2016-11-12 DIAGNOSIS — Z7901 Long term (current) use of anticoagulants: Secondary | ICD-10-CM | POA: Diagnosis not present

## 2016-11-13 LAB — PROTIME-INR
INR: 2.1 — AB (ref 0.8–1.2)
PROTHROMBIN TIME: 21.2 s — AB (ref 9.1–12.0)

## 2016-12-13 ENCOUNTER — Other Ambulatory Visit: Payer: Self-pay | Admitting: Family Medicine

## 2016-12-13 DIAGNOSIS — E039 Hypothyroidism, unspecified: Secondary | ICD-10-CM

## 2016-12-14 ENCOUNTER — Other Ambulatory Visit: Payer: Self-pay

## 2017-01-05 ENCOUNTER — Other Ambulatory Visit: Payer: Self-pay | Admitting: Family Medicine

## 2017-01-05 DIAGNOSIS — Z7901 Long term (current) use of anticoagulants: Secondary | ICD-10-CM

## 2017-01-16 DIAGNOSIS — H53462 Homonymous bilateral field defects, left side: Secondary | ICD-10-CM | POA: Diagnosis not present

## 2017-02-03 ENCOUNTER — Other Ambulatory Visit: Payer: Self-pay | Admitting: Family Medicine

## 2017-02-03 DIAGNOSIS — Z7901 Long term (current) use of anticoagulants: Secondary | ICD-10-CM

## 2017-02-08 ENCOUNTER — Other Ambulatory Visit: Payer: Self-pay | Admitting: Family Medicine

## 2017-02-08 DIAGNOSIS — Z7901 Long term (current) use of anticoagulants: Secondary | ICD-10-CM

## 2017-03-03 ENCOUNTER — Other Ambulatory Visit: Payer: Self-pay | Admitting: Family Medicine

## 2017-03-03 DIAGNOSIS — Z7901 Long term (current) use of anticoagulants: Secondary | ICD-10-CM

## 2017-03-05 ENCOUNTER — Other Ambulatory Visit: Payer: Self-pay

## 2017-03-11 ENCOUNTER — Telehealth: Payer: Self-pay

## 2017-03-11 NOTE — Telephone Encounter (Signed)
Lab will be printed when pt comes in

## 2017-03-11 NOTE — Telephone Encounter (Signed)
Left message wanting to pick up lab order. Looks like in December we asked her to return in 4 weeks for FU labs and she did not. Get those done. She left message asking to be able to pick up orders today. I called and left message to call back here as we may not have that ready until I speak with Baxter Flattery.

## 2017-03-12 ENCOUNTER — Other Ambulatory Visit: Payer: Medicare Other

## 2017-03-12 DIAGNOSIS — D689 Coagulation defect, unspecified: Secondary | ICD-10-CM

## 2017-03-13 LAB — PROTIME-INR
INR: 1.7 — ABNORMAL HIGH (ref 0.8–1.2)
PROTHROMBIN TIME: 17.4 s — AB (ref 9.1–12.0)

## 2017-03-28 ENCOUNTER — Other Ambulatory Visit: Payer: Medicare Other

## 2017-03-28 DIAGNOSIS — Z7901 Long term (current) use of anticoagulants: Secondary | ICD-10-CM | POA: Diagnosis not present

## 2017-03-29 ENCOUNTER — Other Ambulatory Visit: Payer: Self-pay

## 2017-03-29 LAB — PROTIME-INR
INR: 1.6 — ABNORMAL HIGH (ref 0.8–1.2)
Prothrombin Time: 16.7 s — ABNORMAL HIGH (ref 9.1–12.0)

## 2017-04-06 ENCOUNTER — Other Ambulatory Visit: Payer: Self-pay | Admitting: Family Medicine

## 2017-04-06 DIAGNOSIS — Z7901 Long term (current) use of anticoagulants: Secondary | ICD-10-CM

## 2017-04-07 ENCOUNTER — Other Ambulatory Visit: Payer: Self-pay | Admitting: Family Medicine

## 2017-04-07 DIAGNOSIS — E039 Hypothyroidism, unspecified: Secondary | ICD-10-CM

## 2017-04-09 ENCOUNTER — Other Ambulatory Visit: Payer: Self-pay

## 2017-04-09 DIAGNOSIS — Z7901 Long term (current) use of anticoagulants: Secondary | ICD-10-CM

## 2017-04-09 MED ORDER — WARFARIN SODIUM 3 MG PO TABS: 3.0000 mg | ORAL_TABLET | Freq: Every day | ORAL | 5 refills | Status: DC

## 2017-04-18 ENCOUNTER — Other Ambulatory Visit: Payer: Self-pay

## 2017-04-18 DIAGNOSIS — Z7901 Long term (current) use of anticoagulants: Secondary | ICD-10-CM

## 2017-04-18 MED ORDER — WARFARIN SODIUM 1 MG PO TABS: 1.0000 mg | ORAL_TABLET | Freq: Two times a day (BID) | ORAL | 1 refills | Status: DC

## 2017-04-24 ENCOUNTER — Other Ambulatory Visit: Payer: Medicare Other

## 2017-04-24 DIAGNOSIS — Z7901 Long term (current) use of anticoagulants: Secondary | ICD-10-CM

## 2017-04-25 LAB — PROTIME-INR
INR: 1.8 — ABNORMAL HIGH (ref 0.8–1.2)
Prothrombin Time: 18.1 s — ABNORMAL HIGH (ref 9.1–12.0)

## 2017-05-08 ENCOUNTER — Ambulatory Visit (INDEPENDENT_AMBULATORY_CARE_PROVIDER_SITE_OTHER): Payer: Medicare Other | Admitting: Advanced Practice Midwife

## 2017-05-08 ENCOUNTER — Encounter: Payer: Self-pay | Admitting: Advanced Practice Midwife

## 2017-05-08 VITALS — BP 124/78 | Ht 68.0 in | Wt 164.0 lb

## 2017-05-08 DIAGNOSIS — M25569 Pain in unspecified knee: Secondary | ICD-10-CM | POA: Insufficient documentation

## 2017-05-08 DIAGNOSIS — Z0000 Encounter for general adult medical examination without abnormal findings: Secondary | ICD-10-CM | POA: Diagnosis not present

## 2017-05-08 DIAGNOSIS — M6281 Muscle weakness (generalized): Secondary | ICD-10-CM | POA: Insufficient documentation

## 2017-05-08 DIAGNOSIS — S83419A Sprain of medial collateral ligament of unspecified knee, initial encounter: Secondary | ICD-10-CM | POA: Insufficient documentation

## 2017-05-08 NOTE — Progress Notes (Signed)
Patient ID: [name], female   DOB: 19-Aug-1951, 66 y.o.   MRN: [id number]     Gynecology Annual Exam  PCP: Juline Patch, MD  Chief Complaint:  Chief Complaint  Patient presents with  . Annual Exam    History of Present Illness:Patient is a 66 y.o. G1P1001 presents for annual exam. The patient has no complaints today.   LMP: No LMP recorded. Patient is postmenopausal. She denies post menopausal bleeding   The patient is not sexually active. She denies dyspareunia.  The patient does perform self breast exams.  There is no notable family history of breast or ovarian cancer in her family.  The patient wears seatbelts: yes. She does not drive since her stroke in 2004.   The patient has regular exercise: yes.    The patient denies current symptoms of depression.   She admits some anxiety mostly related to her husband's upcoming kidney transplant. Her daughter is the donor.  Review of Systems: Review of Systems  Constitutional: Negative.   HENT: Negative.        Loss of left visual field in both eyes from 2004 stroke  Eyes: Negative.   Respiratory: Negative.   Cardiovascular: Negative.   Gastrointestinal: Negative.   Genitourinary: Negative.   Musculoskeletal: Negative.   Skin: Negative.   Neurological: Negative.   Endo/Heme/Allergies: Negative.   Psychiatric/Behavioral: Negative.     Past Medical History:  Past Medical History:  Diagnosis Date  . Depression   . Hypertension   . Stroke ([place])   . Thyroid disease     Past Surgical History:  Past Surgical History:  Procedure Laterality Date  . CESAREAN SECTION    . CHOLECYSTECTOMY    . COLONOSCOPY  2012   repeat in 10 yrs  . KNEE ARTHROSCOPY W/ MENISCAL REPAIR Left     Gynecologic History:  No LMP recorded. Patient is postmenopausal. Last Pap: 1.5 years ago Results were: normal   Last mammogram: 1.5 years ago Results were: normal Obstetric History: G1P1001  Family History:  Family History    Problem Relation Age of Onset  . Stroke Father   . Breast cancer Maternal Grandmother     Social History:  Social History   Social History  . Marital status: Married    Spouse name: N/A  . Number of children: N/A  . Years of education: N/A   Occupational History  . Not on file.   Social History Main Topics  . Smoking status: Never Smoker  . Smokeless tobacco: Never Used  . Alcohol use No  . Drug use: No  . Sexual activity: No   Other Topics Concern  . Not on file   Social History Narrative  . No narrative on file    Allergies:  Allergies  Allergen Reactions  . Penicillin G Rash    Medications: Prior to Admission medications   Medication Sig Start Date End Date Taking? Authorizing Provider  escitalopram (LEXAPRO) 20 MG tablet Take 1 tablet (20 mg total) by mouth daily. 10/23/16  Yes Juline Patch, MD  levothyroxine (SYNTHROID, LEVOTHROID) 88 MCG tablet Take 1 tablet (88 mcg total) by mouth daily. 10/23/16  Yes Juline Patch, MD  pantoprazole (PROTONIX) 40 MG tablet Take 1 tablet (40 mg total) by mouth daily. 10/23/16  Yes Juline Patch, MD  ramipril (ALTACE) 2.5 MG capsule Take 1 capsule (2.5 mg total) by mouth daily. 10/23/16  Yes Juline Patch, MD  ramipril (ALTACE) 2.5 MG capsule TAKE  1 CAPSULE (2.5 MG TOTAL) BY MOUTH DAILY. 11/12/16  Yes Juline Patch, MD  traZODone (DESYREL) 50 MG tablet Take 1 tablet (50 mg total) by mouth daily. 10/23/16  Yes Juline Patch, MD  warfarin (COUMADIN) 1 MG tablet TAKE 1 TABLET (1 MG TOTAL) BY MOUTH 2 (TWO) TIMES DAILY. 02/11/17  Yes Juline Patch, MD  escitalopram (LEXAPRO) 20 MG tablet TAKE 1 TABLET (20 MG TOTAL) BY MOUTH DAILY. Patient not taking: Reported on 05/08/2017 11/12/16   Juline Patch, MD  Influenza vac split quadrivalent PF (FLUARIX QUADRIVALENT) 0.5 ML injection Fluarix Quad 2017-2018 (PF) 60 mcg (15 mcg x 4)/0.5 mL IM syringe  TO BE ADMINISTERED BY PHARMACIST FOR IMMUNIZATION    [provider]  levothyroxine (SYNTHROID, LEVOTHROID) 88 MCG tablet TAKE 1 TABLET (88 MCG TOTAL) BY MOUTH DAILY. Patient not taking: Reported on 05/08/2017 04/08/17   Juline Patch, MD  warfarin (COUMADIN) 1 MG tablet Take 1 tablet (1 mg total) by mouth 2 (two) times daily. Patient not taking: Reported on 05/08/2017 04/18/17   Juline Patch, MD  warfarin (COUMADIN) 3 MG tablet Take 1 tablet (3 mg total) by mouth daily. Patient not taking: Reported on 05/08/2017 04/09/17   Juline Patch, MD    Physical Exam Vitals: Blood pressure 124/78, height 5\' 8"  (1.727 m), weight 164 lb (74.4 kg).  General: NAD HEENT: normocephalic, anicteric Thyroid: no enlargement, no palpable nodules Pulmonary: No increased work of breathing, CTAB Cardiovascular: RRR, distal pulses 2+ Breast: Breast symmetrical, no tenderness, no palpable nodules or masses, no skin or nipple retraction present, no nipple discharge.  No axillary or supraclavicular lymphadenopathy. Abdomen: NABS, soft, non-tender, non-distended.  Umbilicus without lesions.  No hepatomegaly, splenomegaly or masses palpable. No evidence of hernia   Genitourinary: exam deferred no complaints  Extremities: no edema, erythema, or tenderness Neurologic: Grossly intact Psychiatric: mood appropriate, affect full     Assessment: 66 y.o. G1P1001 Well Woman exam  Plan: Problem List Items Addressed This Visit    None    Visit Diagnoses    Well woman exam without gynecological exam    -  Primary      1) Mammogram - recommend yearly screening mammogram.  Mammogram: will schedule in [place]  2) Continue healthy lifestyle diet and exercise  3) ASCCP guidelines and rationale discussed.  Patient opts for every 3 years screening interval  4) Osteoporosis: most recent screening was a few years ago - per USPTF routine screening DEXA at age 66   5) Routine healthcare maintenance including cholesterol, diabetes screening discussed managed by PCP  6) Colonoscopy to be  scheduled by her PCP  7) Follow up 1 year for routine annual   Rod Can, [place]

## 2017-05-13 ENCOUNTER — Other Ambulatory Visit: Payer: Medicare Other

## 2017-05-13 DIAGNOSIS — Z7901 Long term (current) use of anticoagulants: Secondary | ICD-10-CM | POA: Diagnosis not present

## 2017-05-14 LAB — PROTIME-INR
INR: 2.9 — ABNORMAL HIGH (ref 0.8–1.2)
Prothrombin Time: 28.9 s — ABNORMAL HIGH (ref 9.1–12.0)

## 2017-05-28 ENCOUNTER — Ambulatory Visit (INDEPENDENT_AMBULATORY_CARE_PROVIDER_SITE_OTHER): Payer: Medicare Other | Admitting: Certified Nurse Midwife

## 2017-05-28 ENCOUNTER — Encounter: Payer: Self-pay | Admitting: Certified Nurse Midwife

## 2017-05-28 VITALS — BP 118/68 | HR 63 | Ht 67.0 in | Wt 164.0 lb

## 2017-05-28 DIAGNOSIS — R32 Unspecified urinary incontinence: Secondary | ICD-10-CM

## 2017-05-28 DIAGNOSIS — R350 Frequency of micturition: Secondary | ICD-10-CM

## 2017-05-28 DIAGNOSIS — Z124 Encounter for screening for malignant neoplasm of cervix: Secondary | ICD-10-CM | POA: Diagnosis not present

## 2017-05-28 DIAGNOSIS — R300 Dysuria: Secondary | ICD-10-CM

## 2017-05-28 LAB — POCT URINALYSIS DIPSTICK
BILIRUBIN UA: NEGATIVE
Blood, UA: NEGATIVE
GLUCOSE UA: NEGATIVE
KETONES UA: NEGATIVE
NITRITE UA: NEGATIVE
Spec Grav, UA: 1.015 (ref 1.010–1.025)
Urobilinogen, UA: 0.2 E.U./dL
pH, UA: 5 (ref 5.0–8.0)

## 2017-05-28 MED ORDER — NITROFURANTOIN MONOHYD MACRO 100 MG PO CAPS: 100.0000 mg | ORAL_CAPSULE | Freq: Two times a day (BID) | ORAL | 0 refills | Status: DC

## 2017-05-28 MED ORDER — CEFDINIR 300 MG PO CAPS: 300.0000 mg | ORAL_CAPSULE | Freq: Two times a day (BID) | ORAL | 0 refills | Status: DC

## 2017-05-28 NOTE — Progress Notes (Signed)
Obstetrics & Gynecology Office Visit   Chief Complaint:  Chief Complaint  Patient presents with  . Urinary Frequency    painful urination and incontinence    History of Present Illness: 66 year old G1 P1001 presented with complaints of intermittent dysuria, increased frequency of urination over the weekend. She denies vulvar itching or irritation or any unusual discharge. She is not sexually active. She reports problems with urinary incontinence for the past 3-4 years, but it has worsened over the last month so that she has to change her pads 4-5x/day and still soils her clothes at times. She reports that she will lose urine when she stands up...she has a sudden urge to void, then she is incontinent. She would also like a Pap smear and pelvic exam. Did not have that done with her recent annual exam at [facility]. Last Pap and pelvic was 2016.  Husband's kidney transplant surgery was postponed to next month.   Review of Systems:  Review of Systems  Constitutional: Negative for chills and fever.  Genitourinary:       See HPI     Past Medical History:  Past Medical History:  Diagnosis Date  . Depression   . Hypertension   . Stroke ([place])   . Thyroid disease     Past Surgical History:  Past Surgical History:  Procedure Laterality Date  . CESAREAN SECTION    . CHOLECYSTECTOMY    . COLONOSCOPY  2012   repeat in 10 yrs  . KNEE ARTHROSCOPY W/ MENISCAL REPAIR Left        Family History:  Family History  Problem Relation Age of Onset  . Stroke Father   . Breast cancer Maternal Grandmother     Social History:  Social History   Social History  . Marital status: Married    Spouse name: N/A  . Number of children: N/A  . Years of education: N/A   Occupational History  . Not on file.   Social History Main Topics  . Smoking status: Never Smoker  . Smokeless tobacco: Never Used  . Alcohol use No  . Drug use: No  . Sexual activity: No   Other Topics Concern  . Not on  file   Social History Narrative  . No narrative on file    Allergies:  Allergies  Allergen Reactions  . Penicillin G Rash    Medications: Prior to Admission medications   Medication Sig Start Date End Date Taking? Authorizing Provider         escitalopram (LEXAPRO) 20 MG tablet Take 1 tablet (20 mg total) by mouth daily. 10/23/16   Juline Patch, MD  levothyroxine (SYNTHROID, LEVOTHROID) 88 MCG tablet Take 1 tablet (88 mcg total) by mouth daily. 10/23/16   Juline Patch, MD  pantoprazole (PROTONIX) 40 MG tablet Take 1 tablet (40 mg total) by mouth daily. 10/23/16   Juline Patch, MD  ramipril (ALTACE) 2.5 MG capsule Take 1 capsule (2.5 mg total) by mouth daily. 10/23/16   Juline Patch, MD  traZODone (DESYREL) 50 MG tablet Take 1 tablet (50 mg total) by mouth daily. 10/23/16   Juline Patch, MD  warfarin (COUMADIN) 1 MG tablet TAKE 1 TABLET (1 MG TOTAL) BY MOUTH 2 (TWO) TIMES DAILY. 02/11/17   Juline Patch, MD    Physical Exam Vitals: BP 118/68   Pulse 63   Ht 5\' 7"  (1.702 m)   Wt 164 lb (74.4 kg)   BMI 25.69 kg/m  Physical Exam  Constitutional: She is oriented to person, place, and time. She appears well-developed and well-nourished.  GI: Soft. She exhibits no distension. There is no tenderness.  Genitourinary:  Genitourinary Comments: Vulva: atrophic changes, no lesions or inflammation Vagina: atrophic changes with flattened rugae. ?urethrocele. One digit pelvic exam. Cervix: stenotic os. Pale. Uterus: MP, small, NT, mobile Adnexa: no masses, NT  Neurological: She is alert and oriented to person, place, and time.  Skin: Rash (under pannus around old Cesarean scar) noted.  Psychiatric: She has a normal mood and affect. Her behavior is normal. Judgment and thought content normal.   Results for orders placed or performed in visit on 05/28/17 (from the past 24 hour(s))  POCT Urinalysis Dipstick     Status: Abnormal   Collection Time: 05/28/17 10:54 AM  Result  Value Ref Range   Color, UA yellow    Clarity, UA clear    Glucose, UA neg    Bilirubin, UA neg    Ketones, UA neg    Spec Grav, UA 1.015 1.010 - 1.025   Blood, UA neg    pH, UA 5.0 5.0 - 8.0   Protein, UA trace    Urobilinogen, UA 0.2 0.2 or 1.0 E.U./dL   Nitrite, UA neg    Leukocytes, UA Moderate (2+) (A) Negative    Assessment/ Plan: 66 y.o. G1P1001 with dysuria and pyuria   Probable UTI-urine culture sent   Will start on Omnicef 300 mgm BID x 5 days Screening for cervical cancer-Pap done Urinary incontinence- may be decreased after treatment for UTI, but recommend further evaluation and treatment by urogyn at [facility] in [place] when patient desires.   Dalia Heading, CNM

## 2017-05-30 ENCOUNTER — Other Ambulatory Visit: Payer: Self-pay | Admitting: Certified Nurse Midwife

## 2017-05-30 DIAGNOSIS — Z1231 Encounter for screening mammogram for malignant neoplasm of breast: Secondary | ICD-10-CM

## 2017-05-30 LAB — URINE CULTURE

## 2017-05-31 ENCOUNTER — Telehealth: Payer: Self-pay | Admitting: Certified Nurse Midwife

## 2017-05-31 LAB — PAP IG (IMAGE GUIDED): PAP SMEAR COMMENT: 0

## 2017-05-31 NOTE — Telephone Encounter (Signed)
Called with lab results. +GBS UTI on cefdinir, symptoms resolving. Pap was also normal.

## 2017-06-12 ENCOUNTER — Ambulatory Visit
Admission: RE | Admit: 2017-06-12 | Discharge: 2017-06-12 | Disposition: A | Discharge status: Home / Self Care | Payer: Medicare Other | Source: Ambulatory Visit | Attending: Certified Nurse Midwife | Admitting: Certified Nurse Midwife

## 2017-06-12 DIAGNOSIS — Z1231 Encounter for screening mammogram for malignant neoplasm of breast: Secondary | ICD-10-CM | POA: Diagnosis not present

## 2017-06-12 IMAGING — MG MM DIGITAL SCREENING BILAT W/ CAD
4 series · 4 of 4 positions shown · non-contrast
Comparison: Previous exam(s).

CLINICAL DATA: Screening.

EXAM:
DIGITAL SCREENING BILATERAL MAMMOGRAM WITH CAD

[R CC]
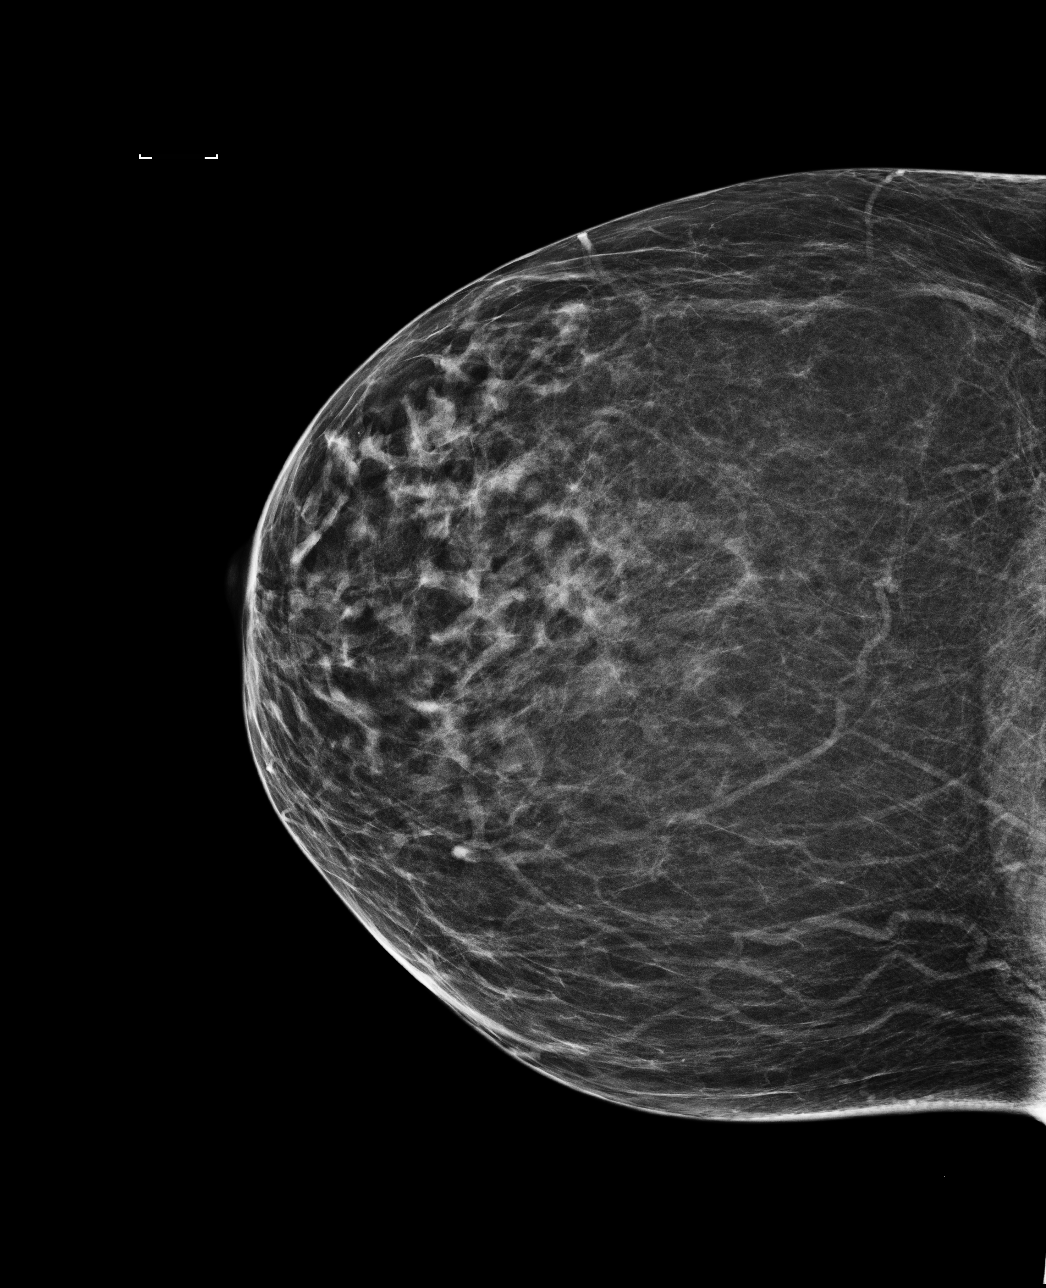

[R MLO]
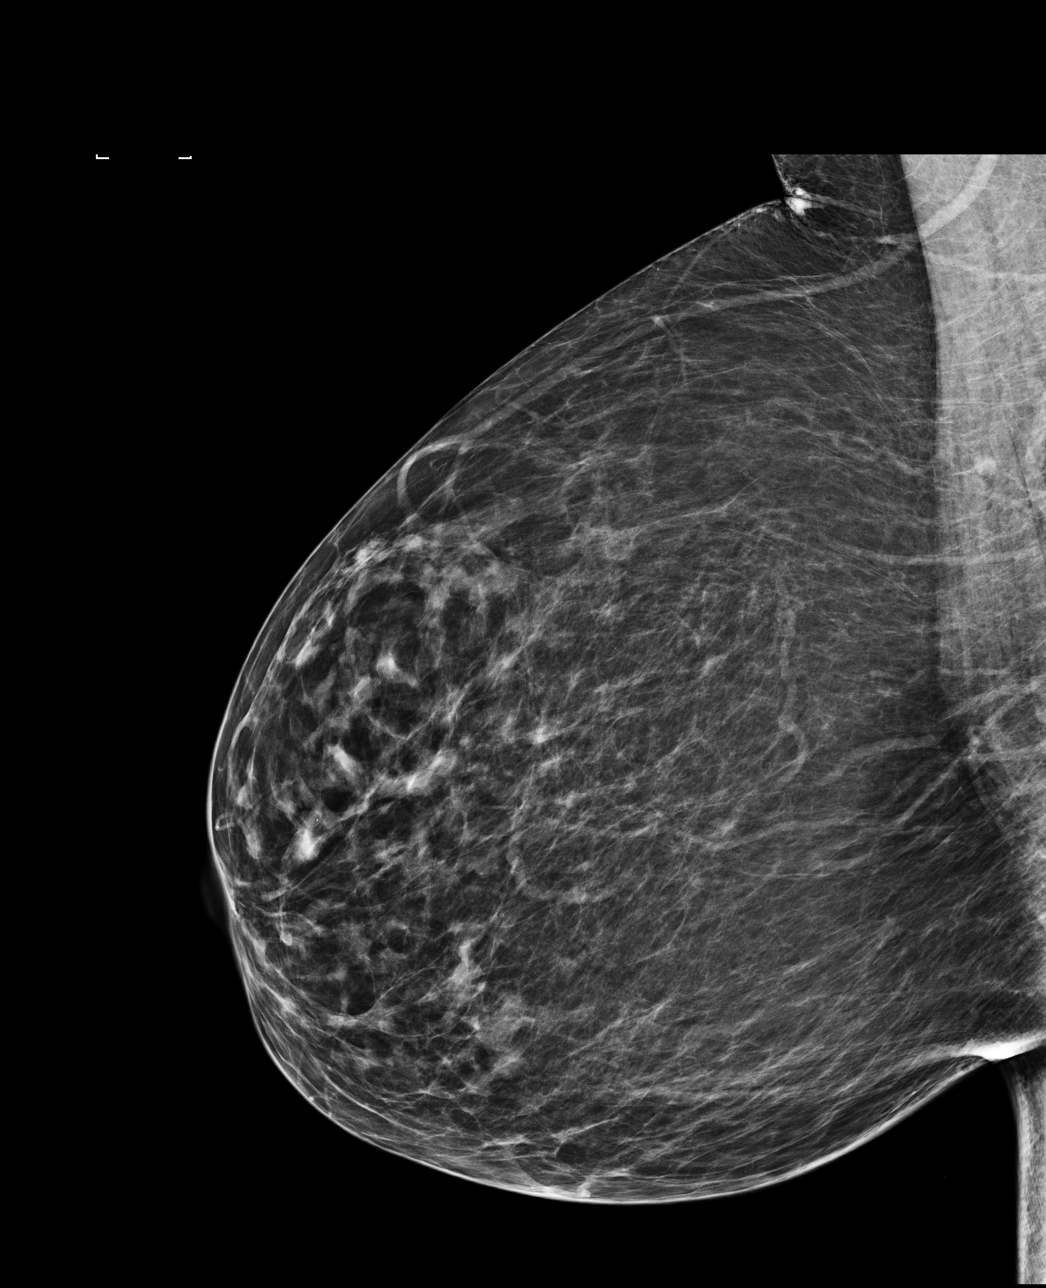

[L MLO]
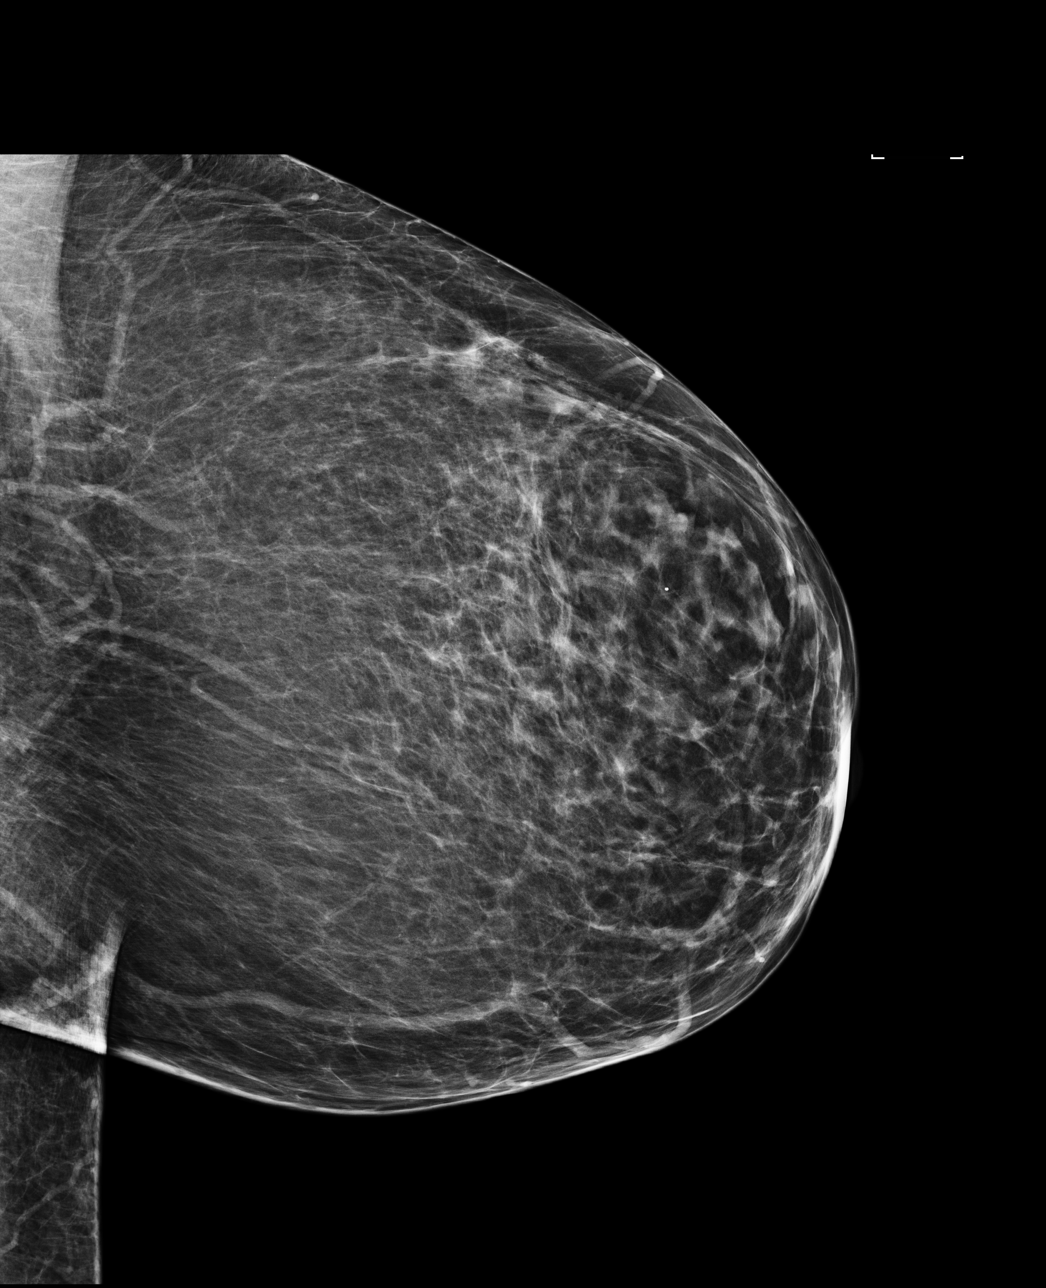

[L CC]
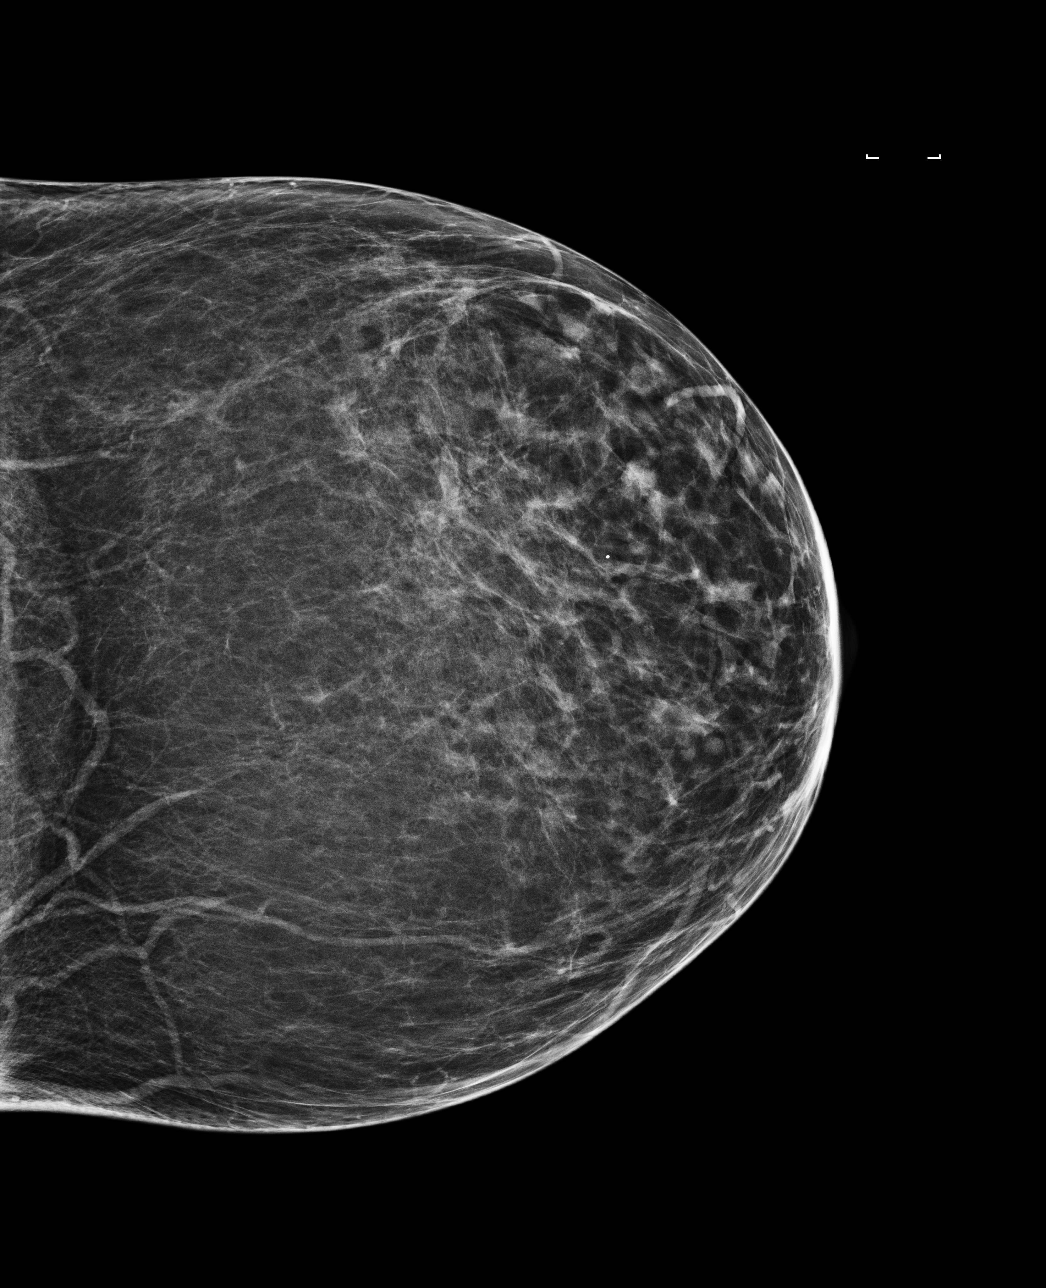

[4 of 4 positions shown; findings below may reference images not displayed]

ACR Breast Density Category c: The breast tissue is heterogeneously
dense, which may obscure small masses.
FINDINGS: There are no findings suspicious for malignancy. Images were
processed with CAD.
IMPRESSION: No mammographic evidence of malignancy. A result letter of this
screening mammogram will be mailed directly to the patient.

RECOMMENDATION:
Screening mammogram in one year. (Code:[0J])

BI-RADS CATEGORY  1: Negative.

## 2017-06-18 ENCOUNTER — Other Ambulatory Visit: Payer: Self-pay | Admitting: Family Medicine

## 2017-06-18 DIAGNOSIS — Z7901 Long term (current) use of anticoagulants: Secondary | ICD-10-CM

## 2017-06-24 ENCOUNTER — Other Ambulatory Visit: Payer: Self-pay

## 2017-07-10 ENCOUNTER — Other Ambulatory Visit: Payer: Medicare Other

## 2017-07-10 DIAGNOSIS — Z7901 Long term (current) use of anticoagulants: Secondary | ICD-10-CM

## 2017-07-10 DIAGNOSIS — Z5181 Encounter for therapeutic drug level monitoring: Secondary | ICD-10-CM | POA: Diagnosis not present

## 2017-07-10 DIAGNOSIS — Z9229 Personal history of other drug therapy: Secondary | ICD-10-CM

## 2017-07-11 LAB — PROTIME-INR
INR: 1.6 — ABNORMAL HIGH (ref 0.8–1.2)
PROTHROMBIN TIME: 16.6 s — AB (ref 9.1–12.0)

## 2017-07-12 ENCOUNTER — Other Ambulatory Visit: Payer: Self-pay

## 2017-07-12 DIAGNOSIS — Z7901 Long term (current) use of anticoagulants: Secondary | ICD-10-CM

## 2017-07-12 MED ORDER — WARFARIN SODIUM 1 MG PO TABS: 1.0000 mg | ORAL_TABLET | Freq: Two times a day (BID) | ORAL | 5 refills | Status: DC

## 2017-07-13 ENCOUNTER — Other Ambulatory Visit: Payer: Self-pay | Admitting: Family Medicine

## 2017-07-13 DIAGNOSIS — F3342 Major depressive disorder, recurrent, in full remission: Secondary | ICD-10-CM

## 2017-07-15 ENCOUNTER — Other Ambulatory Visit: Payer: Self-pay

## 2017-07-15 DIAGNOSIS — Z1211 Encounter for screening for malignant neoplasm of colon: Secondary | ICD-10-CM

## 2017-07-16 ENCOUNTER — Other Ambulatory Visit: Payer: Self-pay | Admitting: Family Medicine

## 2017-07-16 DIAGNOSIS — Z7901 Long term (current) use of anticoagulants: Secondary | ICD-10-CM

## 2017-07-22 ENCOUNTER — Other Ambulatory Visit: Payer: Self-pay | Admitting: Family Medicine

## 2017-07-22 DIAGNOSIS — K219 Gastro-esophageal reflux disease without esophagitis: Secondary | ICD-10-CM

## 2017-07-29 ENCOUNTER — Other Ambulatory Visit: Payer: Medicare Other

## 2017-07-29 DIAGNOSIS — D689 Coagulation defect, unspecified: Secondary | ICD-10-CM | POA: Diagnosis not present

## 2017-07-30 LAB — PROTIME-INR
INR: 4 — AB (ref 0.8–1.2)
PROTHROMBIN TIME: 38 s — AB (ref 9.1–12.0)

## 2017-08-03 ENCOUNTER — Other Ambulatory Visit: Payer: Self-pay | Admitting: Family Medicine

## 2017-08-03 DIAGNOSIS — F32A Depression, unspecified: Secondary | ICD-10-CM

## 2017-08-03 DIAGNOSIS — F329 Major depressive disorder, single episode, unspecified: Secondary | ICD-10-CM

## 2017-08-03 DIAGNOSIS — E039 Hypothyroidism, unspecified: Secondary | ICD-10-CM

## 2017-08-13 ENCOUNTER — Other Ambulatory Visit: Payer: Self-pay | Admitting: Family Medicine

## 2017-08-13 DIAGNOSIS — S60351A Superficial foreign body of right thumb, initial encounter: Secondary | ICD-10-CM

## 2017-08-21 ENCOUNTER — Ambulatory Visit (INDEPENDENT_AMBULATORY_CARE_PROVIDER_SITE_OTHER): Payer: Medicare Other

## 2017-08-21 ENCOUNTER — Other Ambulatory Visit: Payer: Self-pay | Admitting: Family Medicine

## 2017-08-21 DIAGNOSIS — Z23 Encounter for immunization: Secondary | ICD-10-CM

## 2017-08-21 DIAGNOSIS — I10 Essential (primary) hypertension: Secondary | ICD-10-CM

## 2017-08-21 DIAGNOSIS — H1032 Unspecified acute conjunctivitis, left eye: Secondary | ICD-10-CM | POA: Diagnosis not present

## 2017-08-24 ENCOUNTER — Other Ambulatory Visit: Payer: Self-pay | Admitting: Family Medicine

## 2017-08-24 DIAGNOSIS — I10 Essential (primary) hypertension: Secondary | ICD-10-CM

## 2017-08-29 ENCOUNTER — Other Ambulatory Visit: Payer: Self-pay | Admitting: Family Medicine

## 2017-08-29 DIAGNOSIS — I10 Essential (primary) hypertension: Secondary | ICD-10-CM

## 2017-09-02 ENCOUNTER — Other Ambulatory Visit: Payer: Self-pay | Admitting: Family Medicine

## 2017-09-02 DIAGNOSIS — F329 Major depressive disorder, single episode, unspecified: Secondary | ICD-10-CM

## 2017-09-02 DIAGNOSIS — F32A Depression, unspecified: Secondary | ICD-10-CM

## 2017-09-02 DIAGNOSIS — E039 Hypothyroidism, unspecified: Secondary | ICD-10-CM

## 2017-09-05 ENCOUNTER — Other Ambulatory Visit: Payer: Self-pay

## 2017-09-09 ENCOUNTER — Encounter: Payer: Self-pay | Admitting: Family Medicine

## 2017-09-09 ENCOUNTER — Ambulatory Visit (INDEPENDENT_AMBULATORY_CARE_PROVIDER_SITE_OTHER): Payer: Medicare Other | Admitting: Family Medicine

## 2017-09-09 VITALS — BP 120/62 | HR 64 | Ht 68.0 in | Wt 167.0 lb

## 2017-09-09 DIAGNOSIS — E663 Overweight: Secondary | ICD-10-CM

## 2017-09-09 DIAGNOSIS — Z7901 Long term (current) use of anticoagulants: Secondary | ICD-10-CM

## 2017-09-09 DIAGNOSIS — I10 Essential (primary) hypertension: Secondary | ICD-10-CM

## 2017-09-09 DIAGNOSIS — E039 Hypothyroidism, unspecified: Secondary | ICD-10-CM

## 2017-09-09 DIAGNOSIS — F3342 Major depressive disorder, recurrent, in full remission: Secondary | ICD-10-CM

## 2017-09-09 MED ORDER — WARFARIN SODIUM 1 MG PO TABS: 1.0000 mg | ORAL_TABLET | Freq: Two times a day (BID) | ORAL | 5 refills | Status: DC

## 2017-09-09 MED ORDER — LEVOTHYROXINE SODIUM 88 MCG PO TABS: 88.0000 ug | ORAL_TABLET | Freq: Every day | ORAL | 3 refills | Status: DC

## 2017-09-09 MED ORDER — RAMIPRIL 2.5 MG PO CAPS: 2.5000 mg | ORAL_CAPSULE | Freq: Every day | ORAL | 3 refills | Status: DC

## 2017-09-09 MED ORDER — ESCITALOPRAM OXALATE 20 MG PO TABS: 20.0000 mg | ORAL_TABLET | Freq: Every day | ORAL | 3 refills | Status: DC

## 2017-09-09 MED ORDER — TRAZODONE HCL 50 MG PO TABS: 50.0000 mg | ORAL_TABLET | Freq: Every day | ORAL | 3 refills | Status: DC

## 2017-09-09 NOTE — Progress Notes (Signed)
Name: [name] [facility]   MRN: [id number]    DOB: 03-Nov-1951   Date:09/09/2017       Progress Note  Subjective  Chief Complaint  Chief Complaint  Patient presents with  . Depression  . Insomnia  . Hypothyroidism  . Hypertension  . Cerebrovascular Accident    takes warfarin    Depression       The patient presents with depression.  This is a chronic problem.  The current episode started more than 1 year ago.   The onset quality is gradual. The problem is unchanged (stable).  Associated symptoms include insomnia.  Associated symptoms include no decreased concentration, no fatigue, no helplessness, no hopelessness, not irritable, no restlessness, no decreased interest, no appetite change, no body aches, no myalgias, no headaches, no indigestion, not sad and no suicidal ideas.  Past treatments include SSRIs - Selective serotonin reuptake inhibitors.  Compliance with treatment is good.  Past medical history includes thyroid problem and depression.     Pertinent negatives include no chronic fatigue syndrome, no chronic pain, no fibromyalgia, no hypothyroidism, no chronic illness, no life-threatening condition, no physical disability, no terminal illness, no recent psychiatric admission, no dementia, no bipolar disorder, no eating disorder, no mental health disorder, no obsessive-compulsive disorder, no post-traumatic stress disorder and no schizophrenia. Insomnia  Primary symptoms: no fragmented sleep, no sleep disturbance, no difficulty falling asleep, no somnolence, frequent awakening, no premature morning awakening, no malaise/fatigue, no napping.   The current episode started more than one year. The onset quality is gradual. The problem occurs nightly. PMH includes: depression.  Hypertension  This is a chronic problem. The current episode started more than 1 year ago. Pertinent negatives include no blurred vision, chest pain, headaches, malaise/fatigue, neck pain, palpitations or shortness of  breath. There are no associated agents to hypertension. Past treatments include ACE inhibitors. The current treatment provides moderate improvement. There are no compliance problems.  There is no history of angina, kidney disease, CAD/MI, CVA, heart failure, left ventricular hypertrophy, PVD or retinopathy. Identifiable causes of hypertension include a thyroid problem. There is no history of chronic renal disease, a hypertension causing med or renovascular disease.  Cerebrovascular Accident  This is a chronic problem. The current episode started more than 1 year ago. The problem occurs intermittently. The problem has been waxing and waning. Associated symptoms include a visual change. Pertinent negatives include no abdominal pain, anorexia, arthralgias, change in bowel habit, chest pain, chills, congestion, coughing, diaphoresis, fatigue, fever, headaches, joint swelling, myalgias, nausea, neck pain, numbness, rash, sore throat, swollen glands, urinary symptoms, vertigo, vomiting or weakness. Treatments tried: anticoag therapy. The treatment provided moderate relief.  Thyroid Problem  Presents for follow-up visit. Symptoms include visual change. Patient reports no anxiety, cold intolerance, constipation, depressed mood, diaphoresis, diarrhea, dry skin, fatigue, hair loss, heat intolerance, hoarse voice, leg swelling, menstrual problem, nail problem, palpitations, tremors, weight gain or weight loss. The symptoms have been stable. There is no history of heart failure.    No problem-specific Assessment & Plan notes found for this encounter.   Past Medical History:  Diagnosis Date  . Depression   . Hypertension   . Stroke ([place])   . Thyroid disease     Past Surgical History:  Procedure Laterality Date  . CESAREAN SECTION    . CHOLECYSTECTOMY    . COLONOSCOPY  2012   repeat in 10 yrs  . KNEE ARTHROSCOPY W/ MENISCAL REPAIR Left     Family History  Problem Relation  Age of Onset  . Stroke Father    . Breast cancer Maternal Grandmother     Social History   Social History  . Marital status: Married    Spouse name: N/A  . Number of children: N/A  . Years of education: N/A   Occupational History  . Not on file.   Social History Main Topics  . Smoking status: Never Smoker  . Smokeless tobacco: Never Used  . Alcohol use No  . Drug use: No  . Sexual activity: No   Other Topics Concern  . Not on file   Social History Narrative  . No narrative on file    Allergies  Allergen Reactions  . Penicillin G Rash    Outpatient Medications Prior to Visit  Medication Sig Dispense Refill  . escitalopram (LEXAPRO) 20 MG tablet Take 1 tablet (20 mg total) by mouth daily. 90 tablet 2  . levothyroxine (SYNTHROID, LEVOTHROID) 88 MCG tablet Take 1 tablet (88 mcg total) by mouth daily. 90 tablet 2  . pantoprazole (PROTONIX) 40 MG tablet TAKE 1 TABLET (40 MG TOTAL) BY MOUTH DAILY. 90 tablet 0  . ramipril (ALTACE) 2.5 MG capsule Take 1 capsule (2.5 mg total) by mouth daily. 90 capsule 2  . traZODone (DESYREL) 50 MG tablet TAKE 1 TABLET (50 MG TOTAL) BY MOUTH DAILY. 30 tablet 0  . warfarin (COUMADIN) 1 MG tablet Take 1 tablet (1 mg total) by mouth 2 (two) times daily. 30 tablet 5  . cefdinir (OMNICEF) 300 MG capsule Take 1 capsule (300 mg total) by mouth 2 (two) times daily. 10 capsule 0  . escitalopram (LEXAPRO) 20 MG tablet TAKE 1 TABLET (20 MG TOTAL) BY MOUTH DAILY. 30 tablet 0  . escitalopram (LEXAPRO) 20 MG tablet TAKE 1 TABLET (20 MG TOTAL) BY MOUTH DAILY. 30 tablet 0  . escitalopram (LEXAPRO) 20 MG tablet TAKE 1 TABLET (20 MG TOTAL) BY MOUTH DAILY. 15 tablet 0  . levothyroxine (SYNTHROID, LEVOTHROID) 88 MCG tablet TAKE 1 TABLET BY MOUTH EVERY DAY 30 tablet 0  . ramipril (ALTACE) 2.5 MG capsule TAKE 1 CAPSULE (2.5 MG TOTAL) BY MOUTH DAILY. 30 capsule 0  . triamcinolone cream (KENALOG) 0.1 % APPLY 1 APPLICATION TOPICALLY 2 (TWO) TIMES DAILY. 30 g 0  . warfarin (COUMADIN) 1 MG tablet  TAKE 1 TABLET (1 MG TOTAL) BY MOUTH 2 (TWO) TIMES DAILY. 60 tablet 1   No facility-administered medications prior to visit.     Review of Systems  Constitutional: Negative for appetite change, chills, diaphoresis, fatigue, fever, malaise/fatigue, weight gain and weight loss.  HENT: Negative for congestion, ear discharge, ear pain, hoarse voice and sore throat.   Eyes: Negative for blurred vision.  Respiratory: Negative for cough, sputum production, shortness of breath and wheezing.   Cardiovascular: Negative for chest pain, palpitations and leg swelling.  Gastrointestinal: Negative for abdominal pain, anorexia, blood in stool, change in bowel habit, constipation, diarrhea, heartburn, melena, nausea and vomiting.  Genitourinary: Negative for dysuria, frequency, hematuria, menstrual problem and urgency.       Uti 6/18  Musculoskeletal: Negative for arthralgias, back pain, joint pain, joint swelling, myalgias and neck pain.  Skin: Negative for rash.  Neurological: Negative for dizziness, vertigo, tingling, tremors, sensory change, focal weakness, weakness, numbness and headaches.  Endo/Heme/Allergies: Negative for environmental allergies, cold intolerance, heat intolerance and polydipsia. Does not bruise/bleed easily.  Psychiatric/Behavioral: Positive for depression. Negative for decreased concentration, sleep disturbance and suicidal ideas. The patient has insomnia. The patient is not  nervous/anxious.      Objective  Vitals:   09/09/17 1048  BP: 120/62  Pulse: 64  Weight: 167 lb (75.8 kg)  Height: 5\' 8"  (1.727 m)    Physical Exam  Constitutional: She is well-developed, well-nourished, and in no distress. She is not irritable. No distress.  HENT:  Head: Normocephalic and atraumatic.  Right Ear: External ear normal.  Left Ear: External ear normal.  Nose: Nose normal.  Mouth/Throat: Oropharynx is clear and moist.  Eyes: Pupils are equal, round, and reactive to light. Conjunctivae  and EOM are normal. Right eye exhibits no discharge. Left eye exhibits no discharge.  Neck: Normal range of motion. Neck supple. No JVD present. No thyromegaly present.  Cardiovascular: Normal rate, regular rhythm, normal heart sounds and intact distal pulses.  Exam reveals no gallop and no friction rub.   No murmur heard. Pulmonary/Chest: Effort normal and breath sounds normal. She has no wheezes. She has no rales.  Abdominal: Soft. Bowel sounds are normal. She exhibits no mass. There is no tenderness. There is no guarding.  Musculoskeletal: Normal range of motion. She exhibits no edema.  Lymphadenopathy:    She has no cervical adenopathy.  Neurological: She is alert. She has normal reflexes.  Skin: Skin is warm and dry. No rash noted. She is not diaphoretic.  Psychiatric: Mood and affect normal.  Nursing note and vitals reviewed.     Assessment & Plan  Problem List Items Addressed This Visit      Cardiovascular and Mediastinum   Essential hypertension - Primary   Relevant Medications   escitalopram (LEXAPRO) 20 MG tablet   warfarin (COUMADIN) 1 MG tablet   ramipril (ALTACE) 2.5 MG capsule   Other Relevant Orders   Renal Function Panel     Endocrine   Adult hypothyroidism   Relevant Medications   levothyroxine (SYNTHROID, LEVOTHROID) 88 MCG tablet   Other Relevant Orders   TSH     Other   Recurrent major depressive disorder, in full remission ([place])   Relevant Medications   escitalopram (LEXAPRO) 20 MG tablet   ramipril (ALTACE) 2.5 MG capsule   traZODone (DESYREL) 50 MG tablet   Anticoagulant long-term use   Relevant Medications   warfarin (COUMADIN) 1 MG tablet   Other Relevant Orders   INR/PT    Other Visit Diagnoses    Overweight (BMI 25.0-29.9)       Relevant Orders   Lipid Profile      Meds ordered this encounter  Medications  . levothyroxine (SYNTHROID, LEVOTHROID) 88 MCG tablet    Sig: Take 1 tablet (88 mcg total) by mouth daily.    Dispense:  90  tablet    Refill:  3    Last time filling  . escitalopram (LEXAPRO) 20 MG tablet    Sig: Take 1 tablet (20 mg total) by mouth daily.    Dispense:  90 tablet    Refill:  3  . warfarin (COUMADIN) 1 MG tablet    Sig: Take 1 tablet (1 mg total) by mouth 2 (two) times daily.    Dispense:  30 tablet    Refill:  5  . ramipril (ALTACE) 2.5 MG capsule    Sig: Take 1 capsule (2.5 mg total) by mouth daily.    Dispense:  90 capsule    Refill:  3  . traZODone (DESYREL) 50 MG tablet    Sig: Take 1 tablet (50 mg total) by mouth daily.    Dispense:  90 tablet  Refill:  3    sched appt for med refill      Dr. Otilio Miu [facility]  09/09/17

## 2017-09-10 ENCOUNTER — Other Ambulatory Visit: Payer: Self-pay

## 2017-09-10 DIAGNOSIS — E039 Hypothyroidism, unspecified: Secondary | ICD-10-CM

## 2017-09-10 LAB — RENAL FUNCTION PANEL
Albumin: 4 g/dL (ref 3.6–4.8)
BUN / CREAT RATIO: 21 (ref 12–28)
BUN: 24 mg/dL (ref 8–27)
CO2: 21 mmol/L (ref 20–29)
CREATININE: 1.15 mg/dL — AB (ref 0.57–1.00)
Calcium: 9.4 mg/dL (ref 8.7–10.3)
Chloride: 104 mmol/L (ref 96–106)
GFR calc Af Amer: 58 mL/min/{1.73_m2} — ABNORMAL LOW (ref >59)
GFR calc non Af Amer: 50 mL/min/{1.73_m2} — ABNORMAL LOW (ref >59)
Glucose: 77 mg/dL (ref 65–99)
Phosphorus: 3.7 mg/dL (ref 2.5–4.5)
Potassium: 4.6 mmol/L (ref 3.5–5.2)
SODIUM: 140 mmol/L (ref 134–144)

## 2017-09-10 LAB — LIPID PANEL
CHOL/HDL RATIO: 3.4 ratio (ref 0.0–4.4)
Cholesterol, Total: 220 mg/dL — ABNORMAL HIGH (ref 100–199)
HDL: 65 mg/dL (ref >39)
LDL CALC: 135 mg/dL — AB (ref 0–99)
TRIGLYCERIDES: 98 mg/dL (ref 0–149)
VLDL Cholesterol Cal: 20 mg/dL (ref 5–40)

## 2017-09-10 LAB — PROTIME-INR
INR: 2.1 — AB (ref 0.8–1.2)
Prothrombin Time: 20.7 s — ABNORMAL HIGH (ref 9.1–12.0)

## 2017-09-10 LAB — TSH: TSH: 0.246 u[IU]/mL — AB (ref 0.450–4.500)

## 2017-09-10 MED ORDER — LEVOTHYROXINE SODIUM 75 MCG PO TABS: 88.0000 ug | ORAL_TABLET | Freq: Every day | ORAL | 1 refills | Status: DC

## 2017-09-16 ENCOUNTER — Encounter: Payer: Self-pay | Admitting: Certified Nurse Midwife

## 2017-09-18 ENCOUNTER — Other Ambulatory Visit: Payer: Self-pay | Admitting: Certified Nurse Midwife

## 2017-09-18 DIAGNOSIS — H01119 Allergic dermatitis of unspecified eye, unspecified eyelid: Secondary | ICD-10-CM | POA: Diagnosis not present

## 2017-09-18 DIAGNOSIS — R32 Unspecified urinary incontinence: Secondary | ICD-10-CM

## 2017-09-18 DIAGNOSIS — L820 Inflamed seborrheic keratosis: Secondary | ICD-10-CM | POA: Diagnosis not present

## 2017-09-18 DIAGNOSIS — M8580 Other specified disorders of bone density and structure, unspecified site: Secondary | ICD-10-CM

## 2017-09-18 DIAGNOSIS — L814 Other melanin hyperpigmentation: Secondary | ICD-10-CM | POA: Diagnosis not present

## 2017-09-18 DIAGNOSIS — M810 Age-related osteoporosis without current pathological fracture: Secondary | ICD-10-CM

## 2017-09-18 DIAGNOSIS — L578 Other skin changes due to chronic exposure to nonionizing radiation: Secondary | ICD-10-CM | POA: Diagnosis not present

## 2017-09-18 DIAGNOSIS — Z13820 Encounter for screening for osteoporosis: Secondary | ICD-10-CM

## 2017-09-20 ENCOUNTER — Other Ambulatory Visit: Payer: Self-pay | Admitting: Family Medicine

## 2017-09-23 ENCOUNTER — Telehealth: Payer: Self-pay | Admitting: Certified Nurse Midwife

## 2017-09-23 NOTE — Telephone Encounter (Signed)
Patient is aware of DEXA on Wed, 10/09/17 @ 11:20am in [place]. Per patient, [facility] Urogyn called her but she was in the car and couldn't confirm the appt. I gave the patient the phone# listed in [facility] [phone]) and she said she would call them back to confirm.

## 2017-10-07 ENCOUNTER — Other Ambulatory Visit: Payer: Self-pay | Admitting: Family Medicine

## 2017-10-07 DIAGNOSIS — Z880 Allergy status to penicillin: Secondary | ICD-10-CM | POA: Diagnosis not present

## 2017-10-07 DIAGNOSIS — Z1211 Encounter for screening for malignant neoplasm of colon: Secondary | ICD-10-CM | POA: Diagnosis not present

## 2017-10-07 DIAGNOSIS — E039 Hypothyroidism, unspecified: Secondary | ICD-10-CM | POA: Diagnosis not present

## 2017-10-07 DIAGNOSIS — K644 Residual hemorrhoidal skin tags: Secondary | ICD-10-CM | POA: Diagnosis not present

## 2017-10-07 DIAGNOSIS — Z8673 Personal history of transient ischemic attack (TIA), and cerebral infarction without residual deficits: Secondary | ICD-10-CM | POA: Diagnosis not present

## 2017-10-07 DIAGNOSIS — I10 Essential (primary) hypertension: Secondary | ICD-10-CM | POA: Diagnosis not present

## 2017-10-07 DIAGNOSIS — Z7901 Long term (current) use of anticoagulants: Secondary | ICD-10-CM

## 2017-10-07 DIAGNOSIS — D123 Benign neoplasm of transverse colon: Secondary | ICD-10-CM | POA: Diagnosis not present

## 2017-10-07 DIAGNOSIS — K648 Other hemorrhoids: Secondary | ICD-10-CM | POA: Diagnosis not present

## 2017-10-09 ENCOUNTER — Ambulatory Visit
Admission: RE | Admit: 2017-10-09 | Discharge: 2017-10-09 | Disposition: A | Discharge status: Home / Self Care | Payer: Medicare Other | Source: Ambulatory Visit | Attending: Certified Nurse Midwife | Admitting: Certified Nurse Midwife

## 2017-10-09 DIAGNOSIS — M810 Age-related osteoporosis without current pathological fracture: Secondary | ICD-10-CM | POA: Diagnosis not present

## 2017-10-09 DIAGNOSIS — Z780 Asymptomatic menopausal state: Secondary | ICD-10-CM

## 2017-10-10 ENCOUNTER — Other Ambulatory Visit: Payer: Medicare Other

## 2017-10-10 DIAGNOSIS — E039 Hypothyroidism, unspecified: Secondary | ICD-10-CM | POA: Diagnosis not present

## 2017-10-10 DIAGNOSIS — Z7901 Long term (current) use of anticoagulants: Secondary | ICD-10-CM

## 2017-10-11 LAB — PROTIME-INR
INR: 1.5 — ABNORMAL HIGH (ref 0.8–1.2)
Prothrombin Time: 14.8 s — ABNORMAL HIGH (ref 9.1–12.0)

## 2017-10-11 LAB — TSH: TSH: 1.36 u[IU]/mL (ref 0.450–4.500)

## 2017-10-14 ENCOUNTER — Other Ambulatory Visit: Payer: Self-pay

## 2017-10-14 DIAGNOSIS — E039 Hypothyroidism, unspecified: Secondary | ICD-10-CM

## 2017-10-14 MED ORDER — LEVOTHYROXINE SODIUM 75 MCG PO TABS: 75.0000 ug | ORAL_TABLET | Freq: Every day | ORAL | 1 refills | Status: DC

## 2017-10-15 ENCOUNTER — Ambulatory Visit (INDEPENDENT_AMBULATORY_CARE_PROVIDER_SITE_OTHER): Payer: Medicare Other | Admitting: Family Medicine

## 2017-10-15 ENCOUNTER — Encounter: Payer: Self-pay | Admitting: Emergency Medicine

## 2017-10-15 ENCOUNTER — Encounter: Payer: Self-pay | Admitting: Family Medicine

## 2017-10-15 ENCOUNTER — Other Ambulatory Visit
Admission: RE | Admit: 2017-10-15 | Discharge: 2017-10-15 | Disposition: A | Discharge status: Home / Self Care | Payer: Medicare Other | Source: Ambulatory Visit | Attending: Family Medicine | Admitting: Family Medicine

## 2017-10-15 ENCOUNTER — Other Ambulatory Visit: Payer: Self-pay

## 2017-10-15 ENCOUNTER — Emergency Department
Admission: EM | Admit: 2017-10-15 | Discharge: 2017-10-16 | Disposition: A | Discharge status: Home / Self Care | Payer: Medicare Other | Attending: Emergency Medicine | Admitting: Emergency Medicine

## 2017-10-15 VITALS — BP 120/80 | HR 80 | Resp 12 | Ht 68.0 in | Wt 169.0 lb

## 2017-10-15 DIAGNOSIS — R631 Polydipsia: Secondary | ICD-10-CM | POA: Diagnosis not present

## 2017-10-15 DIAGNOSIS — E860 Dehydration: Secondary | ICD-10-CM

## 2017-10-15 DIAGNOSIS — R531 Weakness: Secondary | ICD-10-CM | POA: Diagnosis not present

## 2017-10-15 DIAGNOSIS — M545 Low back pain, unspecified: Secondary | ICD-10-CM

## 2017-10-15 DIAGNOSIS — M791 Myalgia, unspecified site: Secondary | ICD-10-CM | POA: Diagnosis not present

## 2017-10-15 DIAGNOSIS — Z7901 Long term (current) use of anticoagulants: Secondary | ICD-10-CM | POA: Diagnosis not present

## 2017-10-15 DIAGNOSIS — Z8673 Personal history of transient ischemic attack (TIA), and cerebral infarction without residual deficits: Secondary | ICD-10-CM | POA: Diagnosis not present

## 2017-10-15 DIAGNOSIS — R509 Fever, unspecified: Secondary | ICD-10-CM

## 2017-10-15 DIAGNOSIS — R319 Hematuria, unspecified: Secondary | ICD-10-CM

## 2017-10-15 DIAGNOSIS — E039 Hypothyroidism, unspecified: Secondary | ICD-10-CM | POA: Diagnosis not present

## 2017-10-15 DIAGNOSIS — Z79899 Other long term (current) drug therapy: Secondary | ICD-10-CM | POA: Diagnosis not present

## 2017-10-15 DIAGNOSIS — I10 Essential (primary) hypertension: Secondary | ICD-10-CM | POA: Diagnosis not present

## 2017-10-15 LAB — CBC WITH DIFFERENTIAL/PLATELET
BASOS PCT: 1 %
Basophils Absolute: 0.1 10*3/uL (ref 0–0.1)
EOS ABS: 0.1 10*3/uL (ref 0–0.7)
Eosinophils Relative: 1 %
HEMATOCRIT: 31.1 % — AB (ref 35.0–47.0)
HEMOGLOBIN: 10.7 g/dL — AB (ref 12.0–16.0)
Lymphocytes Relative: 9 %
Lymphs Abs: 0.8 10*3/uL — ABNORMAL LOW (ref 1.0–3.6)
MCH: 33.4 pg (ref 26.0–34.0)
MCHC: 34.5 g/dL (ref 32.0–36.0)
MCV: 96.9 fL (ref 80.0–100.0)
MONOS PCT: 9 %
Monocytes Absolute: 0.8 10*3/uL (ref 0.2–0.9)
NEUTROS ABS: 7.2 10*3/uL — AB (ref 1.4–6.5)
NEUTROS PCT: 80 %
Platelets: 280 10*3/uL (ref 150–440)
RBC: 3.21 MIL/uL — AB (ref 3.80–5.20)
RDW: 12.6 % (ref 11.5–14.5)
WBC: 8.9 10*3/uL (ref 3.6–11.0)

## 2017-10-15 LAB — BASIC METABOLIC PANEL
ANION GAP: 9 (ref 5–15)
BUN: 25 mg/dL — ABNORMAL HIGH (ref 6–20)
CALCIUM: 8.9 mg/dL (ref 8.9–10.3)
CO2: 24 mmol/L (ref 22–32)
Chloride: 102 mmol/L (ref 101–111)
Creatinine, Ser: 1.71 mg/dL — ABNORMAL HIGH (ref 0.44–1.00)
GFR calc Af Amer: 35 mL/min — ABNORMAL LOW (ref >60)
GFR calc non Af Amer: 30 mL/min — ABNORMAL LOW (ref >60)
GLUCOSE: 105 mg/dL — AB (ref 65–99)
Potassium: 3.6 mmol/L (ref 3.5–5.1)
Sodium: 135 mmol/L (ref 135–145)

## 2017-10-15 LAB — POCT URINALYSIS DIPSTICK
Bilirubin, UA: NEGATIVE
GLUCOSE UA: NEGATIVE
KETONES UA: NEGATIVE
LEUKOCYTES UA: NEGATIVE
Nitrite, UA: NEGATIVE
Spec Grav, UA: 1.02 (ref 1.010–1.025)
UROBILINOGEN UA: 0.2 U/dL
pH, UA: 6 (ref 5.0–8.0)

## 2017-10-15 LAB — POCT INFLUENZA A/B
INFLUENZA A, POC: NEGATIVE
Influenza B, POC: NEGATIVE

## 2017-10-15 LAB — LACTIC ACID, PLASMA: Lactic Acid, Venous: 0.4 mmol/L — ABNORMAL LOW (ref 0.5–1.9)

## 2017-10-15 LAB — POCT CBG (FASTING - GLUCOSE)-MANUAL ENTRY: GLUCOSE FASTING, POC: 96 mg/dL (ref 70–99)

## 2017-10-15 MED ORDER — SODIUM CHLORIDE 0.9 % IV BOLUS (SEPSIS)
1000.0000 mL | Freq: Once | INTRAVENOUS | Status: AC
  Administered 2017-10-15: 1000 mL via INTRAVENOUS

## 2017-10-15 NOTE — ED Triage Notes (Addendum)
Arrives feeling poorly x 1 week.  Patient had a colonoscopy last week.  PCP sent patient to be evaluated through ED for possible sepsis.  Blood work drawn earlier this afternoon is resulted.  Reviewed patient condition with Dr. Archie Balboa and Lactic Acid ordered.

## 2017-10-15 NOTE — ED Triage Notes (Signed)
Also describes recent dysuria.

## 2017-10-15 NOTE — Discharge Instructions (Signed)
Please seek medical attention for any high fevers, chest pain, shortness of breath, change in behavior, persistent vomiting, bloody stool or any other new or concerning symptoms.  

## 2017-10-15 NOTE — ED Notes (Addendum)
Pt states had colonoscopy last week and since then has "not felt herself." Pt reports intermittent fevers, denies V/D. Pt states she saw PCP today who told pt to come to ED for work up of sepsis. Pt states "Sunday I did not feel like doing anything." Pt denies being around other symptomatic sick individuals. Pt reports she is not sure if she is dehydrated, pt reports some burning with urination. Pt A&O and in NAD at this time. Pt also reports hx of stroke.

## 2017-10-15 NOTE — Progress Notes (Signed)
Name: [name] [facility]   MRN: [id number]    DOB: 12/25/1950   Date:10/15/2017       Progress Note  Subjective  Chief Complaint  Chief Complaint  Patient presents with  . Sinusitis    fever 101.0, pain up the back when urinates, thirsty    Fever   This is a new problem. The current episode started in the past 7 days. The problem occurs intermittently. The problem has been waxing and waning. The maximum temperature noted was 101 to 101.9 F. The temperature was taken using a tympanic thermometer. Associated symptoms include muscle aches. Pertinent negatives include no abdominal pain, chest pain, congestion, coughing, diarrhea, ear pain, headaches, nausea, rash, sleepiness, sore throat, urinary pain, vomiting or wheezing. She has tried acetaminophen for the symptoms. The treatment provided mild relief.  Risk factors: contaminated water     No problem-specific Assessment & Plan notes found for this encounter.   Past Medical History:  Diagnosis Date  . Depression   . Hypertension   . Stroke ([place])   . Thyroid disease     Past Surgical History:  Procedure Laterality Date  . CESAREAN SECTION    . CHOLECYSTECTOMY    . COLONOSCOPY  2012   repeat in 10 yrs  . KNEE ARTHROSCOPY W/ MENISCAL REPAIR Left     Family History  Problem Relation Age of Onset  . Stroke Father   . Breast cancer Maternal Grandmother     Social History   Socioeconomic History  . Marital status: Married    Spouse name: Not on file  . Number of children: Not on file  . Years of education: Not on file  . Highest education level: Not on file  Social Needs  . Financial resource strain: Not on file  . Food insecurity - worry: Not on file  . Food insecurity - inability: Not on file  . Transportation needs - medical: Not on file  . Transportation needs - non-medical: Not on file  Occupational History  . Not on file  Tobacco Use  . Smoking status: Never Smoker  . Smokeless tobacco: Never Used  Substance  and Sexual Activity  . Alcohol use: No    Alcohol/week: 0.0 oz  . Drug use: No  . Sexual activity: No    Birth control/protection: Post-menopausal  Other Topics Concern  . Not on file  Social History Narrative  . Not on file    Allergies  Allergen Reactions  . Penicillin G Rash    Outpatient Medications Prior to Visit  Medication Sig Dispense Refill  . escitalopram (LEXAPRO) 20 MG tablet Take 1 tablet (20 mg total) by mouth daily. 90 tablet 3  . levothyroxine (SYNTHROID, LEVOTHROID) 75 MCG tablet Take 1 tablet (75 mcg total) daily by mouth. 30 tablet 1  . ramipril (ALTACE) 2.5 MG capsule Take 1 capsule (2.5 mg total) by mouth daily. 90 capsule 3  . traZODone (DESYREL) 50 MG tablet Take 1 tablet (50 mg total) by mouth daily. 90 tablet 3  . warfarin (COUMADIN) 1 MG tablet Take 1 tablet (1 mg total) by mouth 2 (two) times daily. 30 tablet 5  . warfarin (COUMADIN) 1 MG tablet TAKE 1 TABLET (1 MG TOTAL) BY MOUTH 2 (TWO) TIMES DAILY. 60 tablet 1  . warfarin (COUMADIN) 3 MG tablet TAKE 1 TABLET (3 MG TOTAL) BY MOUTH DAILY. 30 tablet 1   No facility-administered medications prior to visit.     Review of Systems  Constitutional: Positive for  fever and malaise/fatigue. Negative for chills and weight loss.  HENT: Negative for congestion, ear discharge, ear pain and sore throat.   Eyes: Negative for blurred vision.  Respiratory: Negative for cough, sputum production, shortness of breath and wheezing.   Cardiovascular: Negative for chest pain, palpitations and leg swelling.  Gastrointestinal: Negative for abdominal pain, blood in stool, constipation, diarrhea, heartburn, melena, nausea and vomiting.  Genitourinary: Negative for dysuria, frequency, hematuria and urgency.  Musculoskeletal: Positive for back pain. Negative for joint pain, myalgias and neck pain.  Skin: Negative for rash.  Neurological: Positive for dizziness. Negative for tingling, sensory change, focal weakness and  headaches.  Endo/Heme/Allergies: Negative for environmental allergies and polydipsia. Does not bruise/bleed easily.  Psychiatric/Behavioral: Negative for depression and suicidal ideas. The patient is not nervous/anxious and does not have insomnia.      Objective  Vitals:   10/15/17 1550  BP: 120/80  Pulse: 80  Resp: 12  SpO2: 99%  Weight: 169 lb (76.7 kg)  Height: 5\' 8"  (1.727 m)    Physical Exam  Constitutional: She is well-developed, well-nourished, and in no distress. No distress.  HENT:  Head: Normocephalic and atraumatic.  Right Ear: Tympanic membrane, external ear and ear canal normal.  Left Ear: Tympanic membrane, external ear and ear canal normal.  Nose: Nose normal.  Mouth/Throat: Uvula is midline and oropharynx is clear and moist. Mucous membranes are pale. No oropharyngeal exudate, posterior oropharyngeal edema or posterior oropharyngeal erythema.  Eyes: Conjunctivae and EOM are normal. Pupils are equal, round, and reactive to light. Right eye exhibits no discharge. Left eye exhibits no discharge.  Neck: Normal range of motion. Neck supple. Normal carotid pulses, no hepatojugular reflux and no JVD present. Carotid bruit is not present. No thyromegaly present.  Cardiovascular: Normal rate, regular rhythm, S1 normal, S2 normal, normal heart sounds and intact distal pulses. Exam reveals no gallop, no S3, no S4 and no friction rub.  No murmur heard. Pulmonary/Chest: Effort normal and breath sounds normal. She has no wheezes. She has no rales.  Abdominal: Soft. Normal aorta and bowel sounds are normal. She exhibits no mass. There is no hepatosplenomegaly. There is no tenderness. There is no rigidity, no guarding and no CVA tenderness.  Musculoskeletal: Normal range of motion. She exhibits no edema.       Lumbar back: She exhibits spasm.  Lymphadenopathy:       Head (right side): No submandibular adenopathy present.       Head (left side): No submandibular adenopathy present.     She has no cervical adenopathy.  Neurological: She is alert. She has normal strength and normal reflexes.  Skin: Skin is warm and dry. She is not diaphoretic. There is pallor.  Nail/decreased capillary refill  Psychiatric: Mood and affect normal.      Assessment & Plan  Problem List Items Addressed This Visit    None    Visit Diagnoses    Fever, unspecified fever cause    -  Primary   Relevant Orders   POCT Influenza A/B (Completed)   POCT CBG (Fasting - Glucose) (Completed)   Acute low back pain without sciatica, unspecified back pain laterality       Relevant Orders   POCT urinalysis dipstick (Completed)   Fever, unknown origin       Relevant Orders   POCT CBG (Fasting - Glucose) (Completed)   Myalgia       Dehydration       Relevant Orders   POCT CBG (  Fasting - Glucose) (Completed)   Polydipsia       Relevant Orders   POCT CBG (Fasting - Glucose) (Completed)      No orders of the defined types were placed in this encounter. influenza negative  Urinalysis 2+ blood/1.020/ protein trace/ leukocytes  Negative/ph 6.0 Referral to er for further evaluation /?sepsis /dehydration IV hydration suggested Dr. Mac[facility]  10/15/17

## 2017-10-15 NOTE — ED Provider Notes (Signed)
[facility] Emergency Department Provider Note  ____________________________________________   I have reviewed the triage vital signs and the nursing notes.   HISTORY  Chief Complaint general sickness   History limited by: Not Limited   HPI [name] is a 66 y.o. female who presents to the emergency department today because of weakness.  DURATION:1 week for weakness TIMING: constant CONTEXT: patient underwent a colonoscopy roughly 1 week ago. Since that time she has been feeling weak. Went to pcp today who was concerned she might be septic, have internal bleeding or be dehydrated.  MODIFYING FACTORS: none identified ASSOCIATED SYMPTOMS: denies abdominal pain. She thinks she might have had a fever a couple of days ago  Per medical record review patient has a history of recent colonoscopy.  Past Medical History:  Diagnosis Date  . Depression   . Hypertension   . Stroke ([place])   . Thyroid disease     Patient Active Problem List   Diagnosis Date Noted  . Urinary incontinence 05/28/2017  . Knee pain 05/08/2017  . Muscle weakness 05/08/2017  . Sprain of MCL (medial collateral ligament) of knee 05/08/2017  . Adult hypothyroidism 10/23/2016  . Essential hypertension 10/23/2016  . Gastroesophageal reflux disease without esophagitis 10/23/2016  . Recurrent major depressive disorder, in full remission ([place]) 10/23/2016  . Anticoagulant long-term use 10/23/2016    Past Surgical History:  Procedure Laterality Date  . CESAREAN SECTION    . CHOLECYSTECTOMY    . COLONOSCOPY  2012   repeat in 10 yrs  . KNEE ARTHROSCOPY W/ MENISCAL REPAIR Left     Prior to Admission medications   Medication Sig Start Date End Date Taking? Authorizing Provider  escitalopram (LEXAPRO) 20 MG tablet Take 1 tablet (20 mg total) by mouth daily. 09/09/17   Juline Patch, MD  levothyroxine (SYNTHROID, LEVOTHROID) 75 MCG tablet Take 1 tablet (75 mcg total) daily by  mouth. 10/14/17   Juline Patch, MD  ramipril (ALTACE) 2.5 MG capsule Take 1 capsule (2.5 mg total) by mouth daily. 09/09/17   Juline Patch, MD  traZODone (DESYREL) 50 MG tablet Take 1 tablet (50 mg total) by mouth daily. 09/09/17   Juline Patch, MD  warfarin (COUMADIN) 1 MG tablet Take 1 tablet (1 mg total) by mouth 2 (two) times daily. 09/09/17   Juline Patch, MD  warfarin (COUMADIN) 1 MG tablet TAKE 1 TABLET (1 MG TOTAL) BY MOUTH 2 (TWO) TIMES DAILY. 10/07/17   Juline Patch, MD  warfarin (COUMADIN) 3 MG tablet TAKE 1 TABLET (3 MG TOTAL) BY MOUTH DAILY. 10/07/17   Juline Patch, MD    Allergies Penicillin g  Family History  Problem Relation Age of Onset  . Stroke Father   . Breast cancer Maternal Grandmother     Social History Social History   Tobacco Use  . Smoking status: Never Smoker  . Smokeless tobacco: Never Used  Substance Use Topics  . Alcohol use: No    Alcohol/week: 0.0 oz  . Drug use: No    Review of Systems Constitutional: No fever/chills. Positive for generalized weakness. Eyes: No visual changes. ENT: No sore throat. Cardiovascular: Denies chest pain. Respiratory: Denies shortness of breath. Gastrointestinal: No abdominal pain.  No nausea, no vomiting.  No diarrhea.   Genitourinary: Negative for dysuria. Musculoskeletal: Negative for back pain. Skin: Negative for rash. Neurological: Negative for headaches, focal weakness or numbness.  ____________________________________________   PHYSICAL EXAM:  VITAL SIGNS: ED Triage  Vitals  Enc Vitals Group     BP 10/15/17 1804 117/74     Pulse Rate 10/15/17 1741 (!) 102     Resp 10/15/17 1741 16     Temp 10/15/17 1741 (!) 101 F (38.3 C)     Temp Source 10/15/17 1741 Oral     SpO2 10/15/17 1741 96 %     Weight 10/15/17 1802 169 lb (76.7 kg)     Height 10/15/17 1802 5\' 8"  (1.727 m)     Head Circumference --      Peak Flow --      Pain Score 10/15/17 1801 0   Constitutional: Alert and  oriented. Well appearing and in no distress. Eyes: Conjunctivae are normal.  ENT   Head: Normocephalic and atraumatic.   Nose: No congestion/rhinnorhea.   Mouth/Throat: Mucous membranes are moist.   Neck: No stridor. Hematological/Lymphatic/Immunilogical: No cervical lymphadenopathy. Cardiovascular: Normal rate, regular rhythm.  No murmurs, rubs, or gallops. Respiratory: Normal respiratory effort without tachypnea nor retractions. Breath sounds are clear and equal bilaterally. No wheezes/rales/rhonchi. Gastrointestinal: Soft and non tender. No rebound. No guarding.  Genitourinary: Deferred Musculoskeletal: Normal range of motion in all extremities. No lower extremity edema. Neurologic:  Normal speech and language. No gross focal neurologic deficits are appreciated.  Skin:  Skin is warm, dry and intact. No rash noted. Psychiatric: Mood and affect are normal. Speech and behavior are normal. Patient exhibits appropriate insight and judgment.  ____________________________________________    LABS (pertinent positives/negatives)  Lactic 0.4 BMP cr 1.71  ____________________________________________   EKG  None  ____________________________________________    RADIOLOGY  None  ____________________________________________   PROCEDURES  Procedures  ____________________________________________   INITIAL IMPRESSION / ASSESSMENT AND PLAN / ED COURSE  Pertinent labs & imaging results that were available during my care of the patient were reviewed by me and considered in my medical decision making (see chart for details).  Patient presents from PCP office. Work up there did not show elevation of WBC and patient without elevated lactic, at this point doubt sepsis. Dehydration was possibility for weakness and creatinine is slightly elevated. Patient did feel better after IV fluid hydration. She was able to ambulate around the ED. Will plan on discharging to follow up  with PCP.  Discussed with patient/family results of testing, plan and return precautions.  ____________________________________________   FINAL CLINICAL IMPRESSION(S) / ED DIAGNOSES  Final diagnoses:  Dehydration  Weakness     Note: This dictation was prepared with Dragon dictation. Any transcriptional errors that result from this process are unintentional     Nance Pear, MD 10/15/17 [phone]

## 2017-10-15 NOTE — ED Triage Notes (Signed)
Pt sent from PCP for eval of temp and dehydration, pt was flu negative, WBC of 8, pt awake and alert in no acute distress

## 2017-10-16 NOTE — ED Notes (Signed)

## 2017-10-16 NOTE — ED Notes (Signed)
Pt ambulated in hallway, reports she feels better, pt stable with ambulation. EDP notified.

## 2017-10-17 DIAGNOSIS — R300 Dysuria: Secondary | ICD-10-CM | POA: Diagnosis not present

## 2017-10-17 DIAGNOSIS — I10 Essential (primary) hypertension: Secondary | ICD-10-CM | POA: Diagnosis not present

## 2017-10-17 DIAGNOSIS — R531 Weakness: Secondary | ICD-10-CM | POA: Diagnosis not present

## 2017-10-17 DIAGNOSIS — Z8673 Personal history of transient ischemic attack (TIA), and cerebral infarction without residual deficits: Secondary | ICD-10-CM | POA: Diagnosis not present

## 2017-10-17 DIAGNOSIS — R509 Fever, unspecified: Secondary | ICD-10-CM | POA: Diagnosis not present

## 2017-10-17 DIAGNOSIS — E039 Hypothyroidism, unspecified: Secondary | ICD-10-CM | POA: Diagnosis not present

## 2017-10-17 DIAGNOSIS — M791 Myalgia, unspecified site: Secondary | ICD-10-CM | POA: Diagnosis not present

## 2017-10-18 ENCOUNTER — Ambulatory Visit: Payer: Self-pay | Admitting: Family Medicine

## 2017-10-22 ENCOUNTER — Ambulatory Visit (INDEPENDENT_AMBULATORY_CARE_PROVIDER_SITE_OTHER): Payer: Medicare Other | Admitting: Family Medicine

## 2017-10-22 ENCOUNTER — Encounter: Payer: Self-pay | Admitting: Family Medicine

## 2017-10-22 VITALS — BP 120/82 | HR 76 | Temp 98.2°F | Ht 68.0 in | Wt 168.0 lb

## 2017-10-22 DIAGNOSIS — D649 Anemia, unspecified: Secondary | ICD-10-CM

## 2017-10-22 DIAGNOSIS — E860 Dehydration: Secondary | ICD-10-CM

## 2017-10-22 NOTE — Progress Notes (Signed)
Name: [name]   MRN: [id number]    DOB: Feb 15, 1951   Date:10/22/2017       Progress Note  Subjective  Chief Complaint  Chief Complaint  Patient presents with  . Follow-up    ER visit- received fluids/ no fever at [facility] and hasn't had one since then    Other  This is a new problem. The current episode started in the past 7 days. The problem occurs intermittently. The problem has been waxing and waning. Pertinent negatives include no abdominal pain, anorexia, arthralgias, change in bowel habit, chest pain, chills, congestion, coughing, diaphoresis, fatigue, fever, headaches, joint swelling, myalgias, nausea, neck pain, numbness, rash, sore throat, swollen glands, urinary symptoms, vertigo, visual change, vomiting or weakness. Nothing aggravates the symptoms. She has tried nothing for the symptoms. The treatment provided mild relief.    No problem-specific Assessment & Plan notes found for this encounter.   Past Medical History:  Diagnosis Date  . Depression   . Hypertension   . Stroke ([place])   . Thyroid disease     Past Surgical History:  Procedure Laterality Date  . CESAREAN SECTION    . CHOLECYSTECTOMY    . COLONOSCOPY  2012   repeat in 10 yrs  . KNEE ARTHROSCOPY W/ MENISCAL REPAIR Left     Family History  Problem Relation Age of Onset  . Stroke Father   . Breast cancer Maternal Grandmother     Social History   Socioeconomic History  . Marital status: Married    Spouse name: Not on file  . Number of children: Not on file  . Years of education: Not on file  . Highest education level: Not on file  Social Needs  . Financial resource strain: Not on file  . Food insecurity - worry: Not on file  . Food insecurity - inability: Not on file  . Transportation needs - medical: Not on file  . Transportation needs - non-medical: Not on file  Occupational History  . Not on file  Tobacco Use  . Smoking status: Never Smoker  . Smokeless tobacco: Never Used   Substance and Sexual Activity  . Alcohol use: No    Alcohol/week: 0.0 oz  . Drug use: No  . Sexual activity: No    Birth control/protection: Post-menopausal  Other Topics Concern  . Not on file  Social History Narrative  . Not on file    Allergies  Allergen Reactions  . Penicillin G Rash    Outpatient Medications Prior to Visit  Medication Sig Dispense Refill  . escitalopram (LEXAPRO) 20 MG tablet Take 1 tablet (20 mg total) by mouth daily. 90 tablet 3  . levothyroxine (SYNTHROID, LEVOTHROID) 75 MCG tablet Take 1 tablet (75 mcg total) daily by mouth. 30 tablet 1  . ramipril (ALTACE) 2.5 MG capsule Take 1 capsule (2.5 mg total) by mouth daily. 90 capsule 3  . traZODone (DESYREL) 50 MG tablet Take 1 tablet (50 mg total) by mouth daily. 90 tablet 3  . warfarin (COUMADIN) 1 MG tablet Take 1 tablet (1 mg total) by mouth 2 (two) times daily. 30 tablet 5  . warfarin (COUMADIN) 1 MG tablet TAKE 1 TABLET (1 MG TOTAL) BY MOUTH 2 (TWO) TIMES DAILY. 60 tablet 1  . warfarin (COUMADIN) 3 MG tablet TAKE 1 TABLET (3 MG TOTAL) BY MOUTH DAILY. 30 tablet 1   No facility-administered medications prior to visit.     Review of Systems  Constitutional: Negative for chills, diaphoresis, fatigue,  fever, malaise/fatigue and weight loss.  HENT: Negative for congestion, ear discharge, ear pain and sore throat.   Eyes: Negative for blurred vision.  Respiratory: Negative for cough, sputum production, shortness of breath and wheezing.   Cardiovascular: Negative for chest pain, palpitations and leg swelling.  Gastrointestinal: Negative for abdominal pain, anorexia, blood in stool, change in bowel habit, constipation, diarrhea, heartburn, melena, nausea and vomiting.  Genitourinary: Negative for dysuria, frequency, hematuria and urgency.  Musculoskeletal: Negative for arthralgias, back pain, joint pain, joint swelling, myalgias and neck pain.  Skin: Negative for rash.  Neurological: Negative for dizziness,  vertigo, tingling, sensory change, focal weakness, weakness, numbness and headaches.  Endo/Heme/Allergies: Negative for environmental allergies and polydipsia. Does not bruise/bleed easily.  Psychiatric/Behavioral: Negative for depression and suicidal ideas. The patient is not nervous/anxious and does not have insomnia.      Objective  Vitals:   10/22/17 0938  BP: 120/82  Pulse: 76  Temp: 98.2 F (36.8 C)  TempSrc: Oral  Weight: 168 lb (76.2 kg)  Height: 5\' 8"  (1.727 m)    Physical Exam  Constitutional: She is well-developed, well-nourished, and in no distress. No distress.  HENT:  Head: Normocephalic and atraumatic.  Right Ear: External ear normal.  Left Ear: External ear normal.  Nose: Nose normal.  Mouth/Throat: Oropharynx is clear and moist.  Eyes: Conjunctivae and EOM are normal. Pupils are equal, round, and reactive to light. Right eye exhibits no discharge. Left eye exhibits no discharge.  Neck: Normal range of motion. Neck supple. No JVD present. No thyromegaly present.  Cardiovascular: Normal rate, regular rhythm, normal heart sounds and intact distal pulses. Exam reveals no gallop and no friction rub.  No murmur heard. Pulmonary/Chest: Effort normal and breath sounds normal. She has no wheezes. She has no rales.  Abdominal: Soft. Bowel sounds are normal. She exhibits no mass. There is no tenderness. There is no guarding.  Musculoskeletal: Normal range of motion. She exhibits no edema.  Lymphadenopathy:    She has no cervical adenopathy.  Neurological: She is alert. She has normal reflexes.  Skin: Skin is warm and dry. She is not diaphoretic.  Psychiatric: Mood and affect normal.  Nursing note and vitals reviewed.     Assessment & Plan  Problem List Items Addressed This Visit    None    Visit Diagnoses    Dehydration    -  Primary   Anemia, unspecified type       iron supplement      No orders of the defined types were placed in this  encounter.     Dr. Macon [facility]  10/22/17

## 2017-10-23 DIAGNOSIS — R32 Unspecified urinary incontinence: Secondary | ICD-10-CM | POA: Diagnosis not present

## 2017-10-23 DIAGNOSIS — N3281 Overactive bladder: Secondary | ICD-10-CM | POA: Diagnosis not present

## 2017-10-28 ENCOUNTER — Encounter: Payer: Self-pay | Admitting: Certified Nurse Midwife

## 2017-11-07 ENCOUNTER — Other Ambulatory Visit: Payer: Medicare Other

## 2017-11-07 DIAGNOSIS — Z7901 Long term (current) use of anticoagulants: Secondary | ICD-10-CM | POA: Diagnosis not present

## 2017-11-07 DIAGNOSIS — D649 Anemia, unspecified: Secondary | ICD-10-CM | POA: Diagnosis not present

## 2017-11-08 LAB — HEMOGLOBIN: Hemoglobin: 11.6 g/dL (ref 11.1–15.9)

## 2017-11-08 LAB — PROTIME-INR
INR: 4.5 — AB (ref 0.8–1.2)
Prothrombin Time: 43.3 s — ABNORMAL HIGH (ref 9.1–12.0)

## 2017-11-12 ENCOUNTER — Other Ambulatory Visit: Payer: Self-pay | Admitting: Certified Nurse Midwife

## 2017-11-12 ENCOUNTER — Other Ambulatory Visit: Payer: Self-pay

## 2017-11-12 ENCOUNTER — Encounter: Payer: Self-pay | Admitting: Certified Nurse Midwife

## 2017-11-12 DIAGNOSIS — M810 Age-related osteoporosis without current pathological fracture: Secondary | ICD-10-CM | POA: Insufficient documentation

## 2017-11-19 ENCOUNTER — Encounter: Payer: Self-pay | Admitting: Family Medicine

## 2017-11-19 ENCOUNTER — Ambulatory Visit (INDEPENDENT_AMBULATORY_CARE_PROVIDER_SITE_OTHER): Payer: Medicare Other | Admitting: Family Medicine

## 2017-11-19 VITALS — BP 130/80 | HR 68 | Ht 68.0 in | Wt 166.0 lb

## 2017-11-19 DIAGNOSIS — E860 Dehydration: Secondary | ICD-10-CM | POA: Diagnosis not present

## 2017-11-19 NOTE — Progress Notes (Signed)
Name: [name] [facility]   MRN: [id number]    DOB: 03/07/1951   Date:11/19/2017       Progress Note  Subjective  Chief Complaint  Chief Complaint  Patient presents with  . Follow-up    dehydration/ fever- not had anymore fevers. "feel great"    Recheck s/p dehydration.    No problem-specific Assessment & Plan notes found for this encounter.   Past Medical History:  Diagnosis Date  . Depression   . Hypertension   . Stroke ([place])   . Thyroid disease     Past Surgical History:  Procedure Laterality Date  . CESAREAN SECTION    . CHOLECYSTECTOMY    . COLONOSCOPY  2012   repeat in 10 yrs  . KNEE ARTHROSCOPY W/ MENISCAL REPAIR Left     Family History  Problem Relation Age of Onset  . Stroke Father   . Breast cancer Maternal Grandmother     Social History   Socioeconomic History  . Marital status: Married    Spouse name: Not on file  . Number of children: Not on file  . Years of education: Not on file  . Highest education level: Not on file  Social Needs  . Financial resource strain: Not on file  . Food insecurity - worry: Not on file  . Food insecurity - inability: Not on file  . Transportation needs - medical: Not on file  . Transportation needs - non-medical: Not on file  Occupational History  . Not on file  Tobacco Use  . Smoking status: Never Smoker  . Smokeless tobacco: Never Used  Substance and Sexual Activity  . Alcohol use: No    Alcohol/week: 0.0 oz  . Drug use: No  . Sexual activity: No    Birth control/protection: Post-menopausal  Other Topics Concern  . Not on file  Social History Narrative  . Not on file    Allergies  Allergen Reactions  . Penicillin G Rash    Outpatient Medications Prior to Visit  Medication Sig Dispense Refill  . escitalopram (LEXAPRO) 20 MG tablet Take 1 tablet (20 mg total) by mouth daily. 90 tablet 3  . levothyroxine (SYNTHROID, LEVOTHROID) 75 MCG tablet Take 1 tablet (75 mcg total) daily by mouth. 30 tablet  1  . ramipril (ALTACE) 2.5 MG capsule Take 1 capsule (2.5 mg total) by mouth daily. 90 capsule 3  . traZODone (DESYREL) 50 MG tablet Take 1 tablet (50 mg total) by mouth daily. 90 tablet 3  . warfarin (COUMADIN) 1 MG tablet Take 1 tablet (1 mg total) by mouth 2 (two) times daily. 30 tablet 5  . warfarin (COUMADIN) 1 MG tablet TAKE 1 TABLET (1 MG TOTAL) BY MOUTH 2 (TWO) TIMES DAILY. 60 tablet 1  . warfarin (COUMADIN) 3 MG tablet TAKE 1 TABLET (3 MG TOTAL) BY MOUTH DAILY. 30 tablet 1   No facility-administered medications prior to visit.     Review of Systems  Constitutional: Negative for chills, fever, malaise/fatigue and weight loss.  HENT: Negative for ear discharge, ear pain and sore throat.   Eyes: Negative for blurred vision.  Respiratory: Negative for cough, sputum production, shortness of breath and wheezing.   Cardiovascular: Negative for chest pain, palpitations and leg swelling.  Gastrointestinal: Negative for abdominal pain, blood in stool, constipation, diarrhea, heartburn, melena and nausea.  Genitourinary: Negative for dysuria, frequency, hematuria and urgency.  Musculoskeletal: Negative for back pain, joint pain, myalgias and neck pain.  Skin: Negative for rash.  Neurological:  Negative for dizziness, tingling, sensory change, focal weakness and headaches.  Endo/Heme/Allergies: Negative for environmental allergies and polydipsia. Does not bruise/bleed easily.  Psychiatric/Behavioral: Negative for depression and suicidal ideas. The patient is not nervous/anxious and does not have insomnia.      Objective  Vitals:   11/19/17 1049  BP: 130/80  Pulse: 68  Weight: 166 lb (75.3 kg)  Height: 5\' 8"  (1.727 m)    Physical Exam  Constitutional: She is well-developed, well-nourished, and in no distress. No distress.  HENT:  Head: Normocephalic and atraumatic.  Right Ear: External ear normal.  Left Ear: External ear normal.  Nose: Nose normal.  Mouth/Throat: Oropharynx is  clear and moist.  Eyes: Conjunctivae and EOM are normal. Pupils are equal, round, and reactive to light. Right eye exhibits no discharge. Left eye exhibits no discharge.  Neck: Normal range of motion. Neck supple. No JVD present. No thyromegaly present.  Cardiovascular: Normal rate, regular rhythm, normal heart sounds and intact distal pulses. Exam reveals no gallop and no friction rub.  No murmur heard. Pulmonary/Chest: Effort normal and breath sounds normal. She has no wheezes. She has no rales.  Abdominal: Soft. Bowel sounds are normal. She exhibits no mass. There is no tenderness. There is no guarding.  Musculoskeletal: Normal range of motion. She exhibits no edema.  Lymphadenopathy:    She has no cervical adenopathy.  Neurological: She is alert.  Skin: Skin is warm and dry. She is not diaphoretic.  Psychiatric: Mood and affect normal.  Nursing note and vitals reviewed.     Assessment & Plan  Problem List Items Addressed This Visit    None    Visit Diagnoses    Dehydration    -  Primary   resolving      No orders of the defined types were placed in this encounter.     Dr. Macon [facility]  11/19/17

## 2017-11-21 ENCOUNTER — Ambulatory Visit (INDEPENDENT_AMBULATORY_CARE_PROVIDER_SITE_OTHER): Payer: Medicare Other

## 2017-11-21 VITALS — BP 110/60 | HR 66 | Temp 98.1°F | Resp 16 | Ht 68.0 in | Wt 167.0 lb

## 2017-11-21 DIAGNOSIS — Z1159 Encounter for screening for other viral diseases: Secondary | ICD-10-CM

## 2017-11-21 DIAGNOSIS — Z0000 Encounter for general adult medical examination without abnormal findings: Secondary | ICD-10-CM | POA: Diagnosis not present

## 2017-11-21 NOTE — Progress Notes (Signed)
Subjective:   [name] is a 66 y.o. female who presents for an Initial Medicare Annual Wellness Visit.  Review of Systems      Cardiac Risk Factors include: advanced age (>63men, >36 women);hypertension;sedentary lifestyle     Objective:    Today's Vitals   11/21/17 0909  BP: 110/60  Pulse: 66  Resp: 16  Temp: 98.1 F (36.7 C)  TempSrc: Oral  Weight: 167 lb (75.8 kg)  Height: 5\' 8"  (1.727 m)   Body mass index is 25.39 kg/m.  Advanced Directives 11/21/2017 10/15/2017 03/17/2016 11/09/2015  Does Patient Have a Medical Advance Directive? Yes No Yes Yes  Type of Paramedic of [place];Living will - Living will;Healthcare Power of [place];Living will  Copy of [place] in Chart? No - copy requested - - No - copy requested  Would patient like information on creating a medical advance directive? - No - Patient declined - -    Current Medications (verified) Outpatient Encounter Medications as of 11/21/2017  Medication Sig  . escitalopram (LEXAPRO) 20 MG tablet Take 1 tablet (20 mg total) by mouth daily.  Marland Kitchen levothyroxine (SYNTHROID, LEVOTHROID) 75 MCG tablet Take 1 tablet (75 mcg total) daily by mouth.  . ramipril (ALTACE) 2.5 MG capsule Take 1 capsule (2.5 mg total) by mouth daily.  . traZODone (DESYREL) 50 MG tablet Take 1 tablet (50 mg total) by mouth daily.  Marland Kitchen warfarin (COUMADIN) 1 MG tablet Take 1 tablet (1 mg total) by mouth 2 (two) times daily. (Patient taking differently: Take 1 mg by mouth daily. )  . warfarin (COUMADIN) 1 MG tablet TAKE 1 TABLET (1 MG TOTAL) BY MOUTH 2 (TWO) TIMES DAILY.  Marland Kitchen warfarin (COUMADIN) 3 MG tablet TAKE 1 TABLET (3 MG TOTAL) BY MOUTH DAILY.   No facility-administered encounter medications on file as of 11/21/2017.     Allergies (verified) Penicillin g   History: Past Medical History:  Diagnosis Date  . Depression   . Hypertension   . Stroke ([place])   .  Thyroid disease    Past Surgical History:  Procedure Laterality Date  . CESAREAN SECTION    . CHOLECYSTECTOMY    . COLONOSCOPY  2012   repeat in 10 yrs  . KNEE ARTHROSCOPY W/ MENISCAL REPAIR Left    Family History  Problem Relation Age of Onset  . Stroke Father   . Breast cancer Maternal Grandmother   . Cancer Mother    Social History   Socioeconomic History  . Marital status: Married    Spouse name: None  . Number of children: 1  . Years of education: None  . Highest education level: Master's degree (e.g., MA, MS, MEng, MEd, MSW, MBA)  Social Needs  . Financial resource strain: Not hard at all  . Food insecurity - worry: Never true  . Food insecurity - inability: Never true  . Transportation needs - medical: No  . Transportation needs - non-medical: No  Occupational History  . Occupation: Retired  Tobacco Use  . Smoking status: Never Smoker  . Smokeless tobacco: Never Used  . Tobacco comment: Smoking cessation materials not required  Substance and Sexual Activity  . Alcohol use: No    Alcohol/week: 0.0 oz  . Drug use: No  . Sexual activity: No    Birth control/protection: Post-menopausal  Other Topics Concern  . None  Social History Narrative  . None    Tobacco Counseling Counseling given: No Comment:  Smoking cessation materials not required   Clinical Intake:  Pre-visit preparation completed: Yes  Pain : No/denies pain     BMI - recorded: 25.39 Nutritional Risks: None Diabetes: No  How often do you need to have someone help you when you read instructions, pamphlets, or other written materials from your doctor or pharmacy?: 1 - Never  Interpreter Needed?: No  Information entered by :: AEversole, LPN   Activities of Daily Living In your present state of health, do you have any difficulty performing the following activities: 11/21/2017  Hearing? N  Comment Denies use of hearing aids  Vision? Y  Comment Unable to see out of L eye d/t stroke;  wear eyeglasses  Difficulty concentrating or making decisions? N  Walking or climbing stairs? N  Dressing or bathing? N  Doing errands, shopping? N  Preparing Food and eating ? N  Using the Toilet? N  In the past six months, have you accidently leaked urine? N  Do you have problems with loss of bowel control? N  Managing your Medications? N  Managing your Finances? N  Housekeeping or managing your Housekeeping? N  Some recent data might be hidden     Immunizations and Health Maintenance Immunization History  Administered Date(s) Administered  . Influenza, High Dose Seasonal PF 08/21/2017  . Influenza,inj,Quad PF,6+ Mos 08/19/2015  . Pneumococcal Conjugate-13 11/12/2016   Health Maintenance Due  Topic Date Due  . Hepatitis C Screening  July 01, 1951  . PNA vac Low Risk Adult (2 of 2 - PPSV23) 11/12/2017    Patient Care Team: Juline Patch, MD as PCP - General (Family Medicine)  Indicate any recent Medical Services you may have received from other than [facility] providers in the past year (date may be approximate).     Assessment:   This is a routine wellness examination for Mahkayla.  Hearing/Vision screen Vision Screening Comments: Sees Dr. Roma Schanz for annual eye exams  Dietary issues and exercise activities discussed: Current Exercise Habits: The patient does not participate in regular exercise at present, Exercise limited by: None identified  Goals    . Exercise 150 min/wk Moderate Activity     Recommend to exercise at least 150 minutes per week      Depression Screen PHQ 2/9 Scores 11/21/2017 09/09/2017 09/09/2017 11/09/2015  PHQ - 2 Score 0 0 0 0  PHQ- 9 Score - 1 - -    Fall Risk Fall Risk  11/21/2017 09/09/2017 11/09/2015  Falls in the past year? Yes No No  Comment using weed eater and stumbled into pond - -  Number falls in past yr: 1 - -  Injury with Fall? No - -  Follow up Education provided;Falls prevention discussed - -    Is the patient's home free of  loose throw rugs in walkways, pet beds, electrical cords, etc?   yes      Grab bars in the bathroom? yes      Handrails on the stairs?   yes      Adequate lighting?   yes   Denies use of a shower chair or an elevated toilet seat.  Cognitive Function:     6CIT Screen 11/21/2017  What Year? 0 points  What month? 0 points  What time? 0 points  Count back from 20 0 points  Months in reverse 0 points  Repeat phrase 2 points  Total Score 2    Screening Tests Health Maintenance  Topic Date Due  . Hepatitis C Screening  1951-11-21  . PNA vac Low Risk Adult (2 of 2 - PPSV23) 11/12/2017  . MAMMOGRAM  06/12/2018  . TETANUS/TDAP  11/03/2024  . COLONOSCOPY  10/11/2027  . INFLUENZA VACCINE  Completed  . DEXA SCAN  Completed    Qualifies for Shingles Vaccine? Yes. Education has been provided regarding the importance of this vaccine. Pt has been advised to call her insurance company to determine his/her out of pocket expense. Advised she may also receive this vaccine at her local pharmacy or Health Dept. Verbalized acceptance and understanding.  Cancer Screenings: Breast: Up to date on Mammogram? Yes  Completed 06/13/17. Repeat mammogram every year. Up to date of Bone Density/Dexa? Yes  Completed 10/09/17. No longer required Colorectal: Colonoscopy completed 10/10/17. Repeat every 10 years    Plan:   I have personally reviewed and addressed the Medicare Annual Wellness questionnaire and have noted the following in the patient's chart:  A. Medical and social history B. Use of alcohol, tobacco or illicit drugs  C. Current medications and supplements D. Functional ability and status E.  Nutritional status F.  Physical activity G. Advance directives H. List of other physicians I.  Hospitalizations, surgeries, and ER visits in previous 12 months J.  [place] such as hearing and vision if needed, cognitive and depression L. Referrals and appointments - none  In addition, I  have reviewed and discussed with patient certain preventive protocols, quality metrics, and best practice recommendations. A written personalized care plan for preventive services as well as general preventive health recommendations were provided to patient.  Signed,  Aleatha Borer, LPN Nurse Health Advisor  MD Recommendations: Due for Hep C screening.   Ordered today. Also due for PPSV23. Declined today because she is going to the beach with friends. Would like to wait until her return to receive this vaccine. Advised to call the office when she is ready to receive this vaccine. Verbalized acceptance and understanding.

## 2017-11-21 NOTE — Patient Instructions (Signed)
[name] , Thank you for taking time to come for your Medicare Wellness Visit. I appreciate your ongoing commitment to your health goals. Please review the following plan we discussed and let me know if I can assist you in the future.   Screening recommendations/referrals: Colonoscopy: Up to date Mammogram: Up to date Bone Density: Completed Recommended yearly ophthalmology/optometry visit for glaucoma screening and checkup Recommended yearly dental visit for hygiene and checkup  Vaccinations: Influenza vaccine: Completed Pneumococcal vaccine: PCV13 completed 11/12/16. In need of PPSV23. Declined today. Tdap vaccine: Up to date Shingles vaccine: Declined. Please call your insurance company to determine your out of pocket expense. You may also receive this vaccine at your local pharmacy or Health Dept.  Advanced directives: Please bring a copy of your POA (Power of Attorney) and/or Living Will to your next appointment.   Conditions/risks identified: Recommend to exercise at least 150 minutes per week  Next appointment: Please schedule your Annual Wellness Visit with your Nurse Health Advisor in one year.  Preventive Care 54 Years and Older, Female Preventive care refers to lifestyle choices and visits with your health care provider that can promote health and wellness. What does preventive care include?  A yearly physical exam. This is also called an annual well check.  Dental exams once or twice a year.  Routine eye exams. Ask your health care provider how often you should have your eyes checked.  Personal lifestyle choices, including:  Daily care of your teeth and gums.  Regular physical activity.  Eating a healthy diet.  Avoiding tobacco and drug use.  Limiting alcohol use.  Practicing safe sex.  Taking low-dose aspirin every day.  Taking vitamin and mineral supplements as recommended by your health care provider. What happens during an annual well check? The  services and screenings done by your health care provider during your annual well check will depend on your age, overall health, lifestyle risk factors, and family history of disease. Counseling  Your health care provider may ask you questions about your:  Alcohol use.  Tobacco use.  Drug use.  Emotional well-being.  Home and relationship well-being.  Sexual activity.  Eating habits.  History of falls.  Memory and ability to understand (cognition).  Work and work Statistician.  Reproductive health. Screening  You may have the following tests or measurements:  Height, weight, and BMI.  Blood pressure.  Lipid and cholesterol levels. These may be checked every 5 years, or more frequently if you are over 26 years old.  Skin check.  Lung cancer screening. You may have this screening every year starting at age 44 if you have a 30-pack-year history of smoking and currently smoke or have quit within the past 15 years.  Fecal occult blood test (FOBT) of the stool. You may have this test every year starting at age 37.  Flexible sigmoidoscopy or colonoscopy. You may have a sigmoidoscopy every 5 years or a colonoscopy every 10 years starting at age 61.  Hepatitis C blood test.  Hepatitis B blood test.  Sexually transmitted disease (STD) testing.  Diabetes screening. This is done by checking your blood sugar (glucose) after you have not eaten for a while (fasting). You may have this done every 1-3 years.  Bone density scan. This is done to screen for osteoporosis. You may have this done starting at age 30.  Mammogram. This may be done every 1-2 years. Talk to your health care provider about how often you should have regular mammograms. Talk  with your health care provider about your test results, treatment options, and if necessary, the need for more tests. Vaccines  Your health care provider may recommend certain vaccines, such as:  Influenza vaccine. This is recommended  every year.  Tetanus, diphtheria, and acellular pertussis (Tdap, Td) vaccine. You may need a Td booster every 10 years.  Zoster vaccine. You may need this after age 11.  Pneumococcal 13-valent conjugate (PCV13) vaccine. One dose is recommended after age 34.  Pneumococcal polysaccharide (PPSV23) vaccine. One dose is recommended after age 83. Talk to your health care provider about which screenings and vaccines you need and how often you need them. This information is not intended to replace advice given to you by your health care provider. Make sure you discuss any questions you have with your health care provider. Document Released: 12/16/2015 Document Revised: 08/08/2016 Document Reviewed: 09/20/2015 Elsevier Interactive Patient Education  2017 [place] Prevention in the Home Falls can cause injuries. They can happen to people of all ages. There are many things you can do to make your home safe and to help prevent falls. What can I do on the outside of my home?  Regularly fix the edges of walkways and driveways and fix any cracks.  Remove anything that might make you trip as you walk through a door, such as a raised step or threshold.  Trim any bushes or trees on the path to your home.  Use bright outdoor lighting.  Clear any walking paths of anything that might make someone trip, such as rocks or tools.  Regularly check to see if handrails are loose or broken. Make sure that both sides of any steps have handrails.  Any raised decks and porches should have guardrails on the edges.  Have any leaves, snow, or ice cleared regularly.  Use sand or salt on walking paths during winter.  Clean up any spills in your garage right away. This includes oil or grease spills. What can I do in the bathroom?  Use night lights.  Install grab bars by the toilet and in the tub and shower. Do not use towel bars as grab bars.  Use non-skid mats or decals in the tub or shower.  If  you need to sit down in the shower, use a plastic, non-slip stool.  Keep the floor dry. Clean up any water that spills on the floor as soon as it happens.  Remove soap buildup in the tub or shower regularly.  Attach bath mats securely with double-sided non-slip rug tape.  Do not have throw rugs and other things on the floor that can make you trip. What can I do in the bedroom?  Use night lights.  Make sure that you have a light by your bed that is easy to reach.  Do not use any sheets or blankets that are too big for your bed. They should not hang down onto the floor.  Have a firm chair that has side arms. You can use this for support while you get dressed.  Do not have throw rugs and other things on the floor that can make you trip. What can I do in the kitchen?  Clean up any spills right away.  Avoid walking on wet floors.  Keep items that you use a lot in easy-to-reach places.  If you need to reach something above you, use a strong step stool that has a grab bar.  Keep electrical cords out of the way.  Do  not use floor polish or wax that makes floors slippery. If you must use wax, use non-skid floor wax.  Do not have throw rugs and other things on the floor that can make you trip. What can I do with my stairs?  Do not leave any items on the stairs.  Make sure that there are handrails on both sides of the stairs and use them. Fix handrails that are broken or loose. Make sure that handrails are as long as the stairways.  Check any carpeting to make sure that it is firmly attached to the stairs. Fix any carpet that is loose or worn.  Avoid having throw rugs at the top or bottom of the stairs. If you do have throw rugs, attach them to the floor with carpet tape.  Make sure that you have a light switch at the top of the stairs and the bottom of the stairs. If you do not have them, ask someone to add them for you. What else can I do to help prevent falls?  Wear shoes  that:  Do not have high heels.  Have rubber bottoms.  Are comfortable and fit you well.  Are closed at the toe. Do not wear sandals.  If you use a stepladder:  Make sure that it is fully opened. Do not climb a closed stepladder.  Make sure that both sides of the stepladder are locked into place.  Ask someone to hold it for you, if possible.  Clearly mark and make sure that you can see:  Any grab bars or handrails.  First and last steps.  Where the edge of each step is.  Use tools that help you move around (mobility aids) if they are needed. These include:  Canes.  Walkers.  Scooters.  Crutches.  Turn on the lights when you go into a dark area. Replace any light bulbs as soon as they burn out.  Set up your furniture so you have a clear path. Avoid moving your furniture around.  If any of your floors are uneven, fix them.  If there are any pets around you, be aware of where they are.  Review your medicines with your doctor. Some medicines can make you feel dizzy. This can increase your chance of falling. Ask your doctor what other things that you can do to help prevent falls. This information is not intended to replace advice given to you by your health care provider. Make sure you discuss any questions you have with your health care provider. Document Released: 09/15/2009 Document Revised: 04/26/2016 Document Reviewed: 12/24/2014 Elsevier Interactive Patient Education  2017 [employer].

## 2017-11-22 LAB — HEPATITIS C ANTIBODY: Hep C Virus Ab: <0.1 s/co ratio (ref 0.0–0.9)

## 2018-01-20 DIAGNOSIS — M810 Age-related osteoporosis without current pathological fracture: Secondary | ICD-10-CM | POA: Insufficient documentation

## 2018-01-20 DIAGNOSIS — Z7901 Long term (current) use of anticoagulants: Secondary | ICD-10-CM | POA: Diagnosis not present

## 2018-01-20 DIAGNOSIS — Z5181 Encounter for therapeutic drug level monitoring: Secondary | ICD-10-CM | POA: Diagnosis not present

## 2018-01-23 ENCOUNTER — Encounter: Payer: Self-pay | Admitting: Family Medicine

## 2018-01-23 NOTE — Telephone Encounter (Signed)
Patient mychart message.

## 2018-01-30 ENCOUNTER — Other Ambulatory Visit: Payer: Self-pay

## 2018-01-30 ENCOUNTER — Telehealth: Payer: Self-pay

## 2018-01-30 DIAGNOSIS — Z7901 Long term (current) use of anticoagulants: Secondary | ICD-10-CM

## 2018-01-30 MED ORDER — WARFARIN SODIUM 3 MG PO TABS: 3.0000 mg | ORAL_TABLET | Freq: Every day | ORAL | 2 refills | Status: DC

## 2018-01-30 MED ORDER — WARFARIN SODIUM 1 MG PO TABS: 1.0000 mg | ORAL_TABLET | Freq: Two times a day (BID) | ORAL | 2 refills | Status: DC

## 2018-01-30 NOTE — Telephone Encounter (Signed)
Pt called wanting PT/INR results from Dr Sherren Mocha office. They are 3.4- stay on 4mg  qday- recheck in 2 weeks

## 2018-02-13 ENCOUNTER — Other Ambulatory Visit: Payer: Medicare Other

## 2018-02-13 DIAGNOSIS — R791 Abnormal coagulation profile: Secondary | ICD-10-CM

## 2018-02-14 LAB — PROTIME-INR
INR: 3.1 — ABNORMAL HIGH (ref 0.8–1.2)
Prothrombin Time: 29.9 s — ABNORMAL HIGH (ref 9.1–12.0)

## 2018-03-31 ENCOUNTER — Other Ambulatory Visit: Payer: Self-pay | Admitting: Family Medicine

## 2018-03-31 DIAGNOSIS — E039 Hypothyroidism, unspecified: Secondary | ICD-10-CM

## 2018-05-07 ENCOUNTER — Other Ambulatory Visit: Payer: Medicare Other

## 2018-05-07 DIAGNOSIS — Z7901 Long term (current) use of anticoagulants: Secondary | ICD-10-CM | POA: Diagnosis not present

## 2018-05-08 LAB — PROTIME-INR
INR: 2.8 — AB (ref 0.8–1.2)
Prothrombin Time: 27 s — ABNORMAL HIGH (ref 9.1–12.0)

## 2018-06-10 ENCOUNTER — Other Ambulatory Visit: Payer: Self-pay | Admitting: Family Medicine

## 2018-06-10 DIAGNOSIS — I10 Essential (primary) hypertension: Secondary | ICD-10-CM

## 2018-06-26 ENCOUNTER — Other Ambulatory Visit: Payer: Self-pay | Admitting: Family Medicine

## 2018-06-26 DIAGNOSIS — E039 Hypothyroidism, unspecified: Secondary | ICD-10-CM

## 2018-07-07 ENCOUNTER — Other Ambulatory Visit: Payer: Medicare Other

## 2018-07-07 DIAGNOSIS — E039 Hypothyroidism, unspecified: Secondary | ICD-10-CM | POA: Diagnosis not present

## 2018-07-07 DIAGNOSIS — Z7901 Long term (current) use of anticoagulants: Secondary | ICD-10-CM

## 2018-07-08 LAB — PROTIME-INR
INR: 7.2 — AB (ref 0.8–1.2)
PROTHROMBIN TIME: 62.6 s — AB (ref 9.1–12.0)

## 2018-07-08 LAB — THYROID PANEL WITH TSH
Free Thyroxine Index: 1.9 (ref 1.2–4.9)
T3 UPTAKE RATIO: 29 % (ref 24–39)
T4 TOTAL: 6.4 ug/dL (ref 4.5–12.0)
TSH: 2.14 u[IU]/mL (ref 0.450–4.500)

## 2018-07-10 ENCOUNTER — Encounter: Payer: Self-pay | Admitting: Family Medicine

## 2018-07-10 ENCOUNTER — Ambulatory Visit (INDEPENDENT_AMBULATORY_CARE_PROVIDER_SITE_OTHER): Payer: Medicare Other | Admitting: Family Medicine

## 2018-07-10 VITALS — BP 118/70 | HR 76 | Ht 68.0 in | Wt 188.0 lb

## 2018-07-10 DIAGNOSIS — F3342 Major depressive disorder, recurrent, in full remission: Secondary | ICD-10-CM | POA: Diagnosis not present

## 2018-07-10 DIAGNOSIS — I10 Essential (primary) hypertension: Secondary | ICD-10-CM | POA: Diagnosis not present

## 2018-07-10 DIAGNOSIS — Z7901 Long term (current) use of anticoagulants: Secondary | ICD-10-CM | POA: Diagnosis not present

## 2018-07-10 DIAGNOSIS — T45511A Poisoning by anticoagulants, accidental (unintentional), initial encounter: Secondary | ICD-10-CM

## 2018-07-10 DIAGNOSIS — E039 Hypothyroidism, unspecified: Secondary | ICD-10-CM

## 2018-07-10 MED ORDER — LEVOTHYROXINE SODIUM 75 MCG PO TABS: 75.0000 ug | ORAL_TABLET | Freq: Every day | ORAL | 1 refills | Status: DC

## 2018-07-10 MED ORDER — RAMIPRIL 2.5 MG PO CAPS: 2.5000 mg | ORAL_CAPSULE | Freq: Every day | ORAL | 1 refills | Status: DC

## 2018-07-10 MED ORDER — TRAZODONE HCL 50 MG PO TABS: 50.0000 mg | ORAL_TABLET | Freq: Every day | ORAL | 1 refills | Status: DC

## 2018-07-10 MED ORDER — WARFARIN SODIUM 3 MG PO TABS: 3.0000 mg | ORAL_TABLET | Freq: Every day | ORAL | 2 refills | Status: DC

## 2018-07-10 MED ORDER — ESCITALOPRAM OXALATE 20 MG PO TABS: 20.0000 mg | ORAL_TABLET | Freq: Every day | ORAL | 1 refills | Status: DC

## 2018-07-10 NOTE — Progress Notes (Signed)
Name: [name] [facility]   MRN: [id number]    DOB: May 14, 1951   Date:07/10/2018       Progress Note  Subjective  Chief Complaint  Chief Complaint  Patient presents with  . Depression  . Hypertension  . Hypothyroidism  . Insomnia    takes trazodone for this  . anticoag use    takes warfarin    Depression         This is a recurrent problem.  The current episode started more than 1 year ago.   The onset quality is sudden.   The problem occurs intermittently.  The problem has been gradually improving since onset.  Associated symptoms include insomnia and sad.  Associated symptoms include no decreased concentration, no fatigue, no helplessness, no hopelessness, not irritable, no restlessness, no decreased interest, no appetite change, no body aches, no myalgias, no headaches, no indigestion and no suicidal ideas.     The symptoms are aggravated by nothing.  Past treatments include SSRIs - Selective serotonin reuptake inhibitors.  Compliance with treatment is good.  Past compliance problems include difficulty with treatment plan.  Previous treatment provided mild relief.  Risk factors include a change in medication usage/dosage.   Past medical history includes hypothyroidism.     Pertinent negatives include no anxiety. Hypertension  This is a chronic problem. The current episode started more than 1 year ago. The problem has been waxing and waning since onset. The problem is controlled. Pertinent negatives include no anxiety, blurred vision, chest pain, headaches, malaise/fatigue, neck pain, orthopnea, palpitations, peripheral edema, PND, shortness of breath or sweats. There are no associated agents to hypertension. Risk factors for coronary artery disease include diabetes mellitus and dyslipidemia. The current treatment provides moderate improvement. There are no compliance problems.  There is no history of angina, kidney disease, CAD/MI, CVA, heart failure, left ventricular hypertrophy, PVD or  retinopathy. There is no history of chronic renal disease, a hypertension causing med or renovascular disease.  Insomnia  Primary symptoms: no fragmented sleep, no sleep disturbance, no difficulty falling asleep, no somnolence, no frequent awakening, no premature morning awakening, no malaise/fatigue, no napping.   The current episode started more than one year. The onset quality is undetermined. The problem occurs intermittently. The problem is unchanged. The symptoms are aggravated by medication changes. Past treatments include medication. The treatment provided moderate relief. PMH includes: no hypertension, depression, family stress or anxiety, no restless leg syndrome, no work related stressors, no chronic pain, no apnea.    Essential hypertension Chronic Controlled Continue Ramipril 2.5 mg daily. Recheck renal panel .  Adult hypothyroidism Chronic Stable Noted last tsh and panel in normal range . Continue levothyroxine 75 mcgm daily. Recheck 6 months  Anticoagulant long-term use Continue anticoaguation at 3 mg daily recheck pt/inr in 1 week.   Past Medical History:  Diagnosis Date  . Depression   . Hypertension   . Stroke ([place])   . Thyroid disease     Past Surgical History:  Procedure Laterality Date  . CESAREAN SECTION    . CHOLECYSTECTOMY    . COLONOSCOPY  2012   repeat in 10 yrs  . KNEE ARTHROSCOPY W/ MENISCAL REPAIR Left     Family History  Problem Relation Age of Onset  . Stroke Father   . Breast cancer Maternal Grandmother   . Cancer Mother     Social History   Socioeconomic History  . Marital status: Married    Spouse name: Not on file  . Number  of children: 1  . Years of education: Not on file  . Highest education level: Master's degree (e.g., MA, MS, MEng, MEd, MSW, MBA)  Occupational History  . Occupation: Retired  Scientific laboratory technician  . Financial resource strain: Not hard at all  . Food insecurity:    Worry: Never true    Inability: Never true  .  Transportation needs:    Medical: No    Non-medical: No  Tobacco Use  . Smoking status: Never Smoker  . Smokeless tobacco: Never Used  . Tobacco comment: Smoking cessation materials not required  Substance and Sexual Activity  . Alcohol use: No    Alcohol/week: 0.0 standard drinks  . Drug use: No  . Sexual activity: Never    Birth control/protection: Post-menopausal  Lifestyle  . Physical activity:    Days per week: 0 days    Minutes per session: 0 min  . Stress: Not at all  Relationships  . Social connections:    Talks on phone: More than three times a week    Gets together: Once a week    Attends religious service: 1 to 4 times per year    Active member of club or organization: No    Attends meetings of clubs or organizations: Never    Relationship status: Married  . Intimate partner violence:    Fear of current or ex partner: No    Emotionally abused: No    Physically abused: No    Forced sexual activity: No  Other Topics Concern  . Not on file  Social History Narrative  . Not on file    Allergies  Allergen Reactions  . Penicillin G Rash    Outpatient Medications Prior to Visit  Medication Sig Dispense Refill  . warfarin (COUMADIN) 1 MG tablet Take 1 tablet (1 mg total) by mouth 2 (two) times daily. 60 tablet 2  . escitalopram (LEXAPRO) 20 MG tablet TAKE 1 TABLET BY MOUTH EVERY DAY 90 tablet 0  . levothyroxine (SYNTHROID, LEVOTHROID) 75 MCG tablet TAKE 1 TABLET BY MOUTH EVERY DAY 90 tablet 0  . ramipril (ALTACE) 2.5 MG capsule Take 1 capsule (2.5 mg total) by mouth daily. 90 capsule 3  . traZODone (DESYREL) 50 MG tablet Take 1 tablet (50 mg total) by mouth daily. 90 tablet 3  . warfarin (COUMADIN) 1 MG tablet Take 1 tablet (1 mg total) by mouth 2 (two) times daily. (Patient taking differently: Take 1 mg by mouth daily. ) 30 tablet 5  . warfarin (COUMADIN) 3 MG tablet Take 1 tablet (3 mg total) by mouth daily. 60 tablet 2   No facility-administered medications  prior to visit.     Review of Systems  Constitutional: Negative for appetite change, chills, fatigue, fever, malaise/fatigue and weight loss.  HENT: Negative for ear discharge, ear pain and sore throat.   Eyes: Negative for blurred vision.  Respiratory: Negative for apnea, cough, sputum production, shortness of breath and wheezing.   Cardiovascular: Negative for chest pain, palpitations, orthopnea, leg swelling and PND.  Gastrointestinal: Negative for abdominal pain, blood in stool, constipation, diarrhea, heartburn, melena and nausea.  Genitourinary: Negative for dysuria, frequency, hematuria and urgency.  Musculoskeletal: Negative for back pain, joint pain, myalgias and neck pain.  Skin: Negative for rash.  Neurological: Negative for dizziness, tingling, sensory change, focal weakness and headaches.  Endo/Heme/Allergies: Negative for environmental allergies and polydipsia. Does not bruise/bleed easily.  Psychiatric/Behavioral: Positive for depression. Negative for decreased concentration, sleep disturbance and suicidal ideas. The  patient has insomnia. The patient is not nervous/anxious.      Objective  Vitals:   07/10/18 1106  BP: 118/70  Pulse: 76  Weight: 188 lb (85.3 kg)  Height: 5\' 8"  (1.727 m)    Physical Exam  Constitutional: She is oriented to person, place, and time. She appears well-developed and well-nourished. She is not irritable.  HENT:  Head: Normocephalic.  Right Ear: External ear normal.  Left Ear: External ear normal.  Mouth/Throat: Oropharynx is clear and moist.  Eyes: Pupils are equal, round, and reactive to light. Conjunctivae and EOM are normal. Lids are everted and swept, no foreign bodies found. Left eye exhibits no hordeolum. No foreign body present in the left eye. Right conjunctiva is not injected. Left conjunctiva is not injected. No scleral icterus.  Neck: Normal range of motion. Neck supple. No JVD present. No tracheal deviation present. No  thyromegaly present.  Cardiovascular: Normal rate, regular rhythm, normal heart sounds and intact distal pulses. Exam reveals no gallop and no friction rub.  No murmur heard. Pulmonary/Chest: Effort normal and breath sounds normal. No respiratory distress. She has no wheezes. She has no rales.  Abdominal: Soft. Bowel sounds are normal. She exhibits no mass. There is no hepatosplenomegaly. There is no tenderness. There is no rebound and no guarding.  Musculoskeletal: Normal range of motion. She exhibits no edema or tenderness.  Lymphadenopathy:    She has no cervical adenopathy.  Neurological: She is alert and oriented to person, place, and time. She has normal strength. She displays normal reflexes. No cranial nerve deficit.  Skin: Skin is warm. No rash noted.  Psychiatric: She has a normal mood and affect. Her mood appears not anxious. She does not exhibit a depressed mood.  Nursing note and vitals reviewed.     Assessment & Plan  Problem List Items Addressed This Visit      Cardiovascular and Mediastinum   Essential hypertension    Chronic Controlled Continue Ramipril 2.5 mg daily. Recheck renal panel .      Relevant Medications   warfarin (COUMADIN) 3 MG tablet   escitalopram (LEXAPRO) 20 MG tablet   ramipril (ALTACE) 2.5 MG capsule   Other Relevant Orders   Renal Function Panel     Endocrine   Adult hypothyroidism    Chronic Stable Noted last tsh and panel in normal range . Continue levothyroxine 75 mcgm daily. Recheck 6 months      Relevant Medications   levothyroxine (SYNTHROID, LEVOTHROID) 75 MCG tablet     Other   Recurrent major depressive disorder, in full remission (HCC)   Relevant Medications   escitalopram (LEXAPRO) 20 MG tablet   ramipril (ALTACE) 2.5 MG capsule   traZODone (DESYREL) 50 MG tablet   Anticoagulant long-term use    Continue anticoaguation at 3 mg daily recheck pt/inr in 1 week.      Relevant Medications   warfarin (COUMADIN) 3 MG tablet     Other Visit Diagnoses    Coumadin toxicity, accidental or unintentional, initial encounter    -  Primary   Pt did not return on suggestrd time. INR > 7.0 Hold coumadin 4 days then resume at reduced dose of 3 mg daily.       Meds ordered this encounter  Medications  . levothyroxine (SYNTHROID, LEVOTHROID) 75 MCG tablet    Sig: Take 1 tablet (75 mcg total) by mouth daily.    Dispense:  90 tablet    Refill:  1  . warfarin (COUMADIN)  3 MG tablet    Sig: Take 1 tablet (3 mg total) by mouth daily.    Dispense:  60 tablet    Refill:  2  . escitalopram (LEXAPRO) 20 MG tablet    Sig: Take 1 tablet (20 mg total) by mouth daily.    Dispense:  90 tablet    Refill:  1    May have 90 but needs to be seen August  . ramipril (ALTACE) 2.5 MG capsule    Sig: Take 1 capsule (2.5 mg total) by mouth daily.    Dispense:  90 capsule    Refill:  1  . traZODone (DESYREL) 50 MG tablet    Sig: Take 1 tablet (50 mg total) by mouth daily.    Dispense:  90 tablet    Refill:  1    sched appt for med refill      Dr. Otilio Miu [facility]  07/10/18

## 2018-07-10 NOTE — Assessment & Plan Note (Signed)
Chronic Stable Noted last tsh and panel in normal range . Continue levothyroxine 75 mcgm daily. Recheck 6 months

## 2018-07-10 NOTE — Assessment & Plan Note (Signed)
Continue anticoaguation at 3 mg daily recheck pt/inr in 1 week.

## 2018-07-10 NOTE — Assessment & Plan Note (Signed)
Chronic Controlled Continue Ramipril 2.5 mg daily. Recheck renal panel .

## 2018-07-11 LAB — RENAL FUNCTION PANEL
ALBUMIN: 4.2 g/dL (ref 3.6–4.8)
BUN/Creatinine Ratio: 20 (ref 12–28)
BUN: 24 mg/dL (ref 8–27)
CHLORIDE: 104 mmol/L (ref 96–106)
CO2: 22 mmol/L (ref 20–29)
Calcium: 9.6 mg/dL (ref 8.7–10.3)
Creatinine, Ser: 1.23 mg/dL — ABNORMAL HIGH (ref 0.57–1.00)
GFR calc non Af Amer: 46 mL/min/{1.73_m2} — ABNORMAL LOW (ref >59)
GFR, EST AFRICAN AMERICAN: 53 mL/min/{1.73_m2} — AB (ref >59)
GLUCOSE: 84 mg/dL (ref 65–99)
POTASSIUM: 4.6 mmol/L (ref 3.5–5.2)
Phosphorus: 3.1 mg/dL (ref 2.5–4.5)
SODIUM: 140 mmol/L (ref 134–144)

## 2018-07-31 ENCOUNTER — Other Ambulatory Visit: Payer: Medicare Other

## 2018-07-31 DIAGNOSIS — Z7901 Long term (current) use of anticoagulants: Secondary | ICD-10-CM

## 2018-08-01 LAB — PROTIME-INR
INR: 2.7 — ABNORMAL HIGH (ref 0.8–1.2)
Prothrombin Time: 25.8 s — ABNORMAL HIGH (ref 9.1–12.0)

## 2018-08-02 DIAGNOSIS — Z23 Encounter for immunization: Secondary | ICD-10-CM | POA: Diagnosis not present

## 2018-08-08 ENCOUNTER — Other Ambulatory Visit: Payer: Self-pay | Admitting: Family Medicine

## 2018-08-08 DIAGNOSIS — I10 Essential (primary) hypertension: Secondary | ICD-10-CM

## 2018-08-08 DIAGNOSIS — Z7901 Long term (current) use of anticoagulants: Secondary | ICD-10-CM

## 2018-08-26 ENCOUNTER — Other Ambulatory Visit: Payer: Medicare Other

## 2018-08-26 DIAGNOSIS — Z7901 Long term (current) use of anticoagulants: Secondary | ICD-10-CM | POA: Diagnosis not present

## 2018-08-27 LAB — PROTIME-INR
INR: 2 — AB (ref 0.8–1.2)
PROTHROMBIN TIME: 19.9 s — AB (ref 9.1–12.0)

## 2018-10-23 ENCOUNTER — Other Ambulatory Visit: Payer: Medicare Other

## 2018-10-23 DIAGNOSIS — Z7901 Long term (current) use of anticoagulants: Secondary | ICD-10-CM

## 2018-10-24 LAB — PROTIME-INR
INR: 2.3 — ABNORMAL HIGH (ref 0.8–1.2)
Prothrombin Time: 22.1 s — ABNORMAL HIGH (ref 9.1–12.0)

## 2018-11-04 DIAGNOSIS — R202 Paresthesia of skin: Secondary | ICD-10-CM | POA: Diagnosis not present

## 2018-11-04 DIAGNOSIS — L819 Disorder of pigmentation, unspecified: Secondary | ICD-10-CM | POA: Diagnosis not present

## 2018-11-04 DIAGNOSIS — L853 Xerosis cutis: Secondary | ICD-10-CM | POA: Diagnosis not present

## 2018-11-09 ENCOUNTER — Other Ambulatory Visit: Payer: Self-pay | Admitting: Family Medicine

## 2018-11-09 DIAGNOSIS — Z7901 Long term (current) use of anticoagulants: Secondary | ICD-10-CM

## 2018-11-09 DIAGNOSIS — I10 Essential (primary) hypertension: Secondary | ICD-10-CM

## 2018-11-17 DIAGNOSIS — H53462 Homonymous bilateral field defects, left side: Secondary | ICD-10-CM | POA: Diagnosis not present

## 2018-11-24 ENCOUNTER — Ambulatory Visit: Payer: Self-pay

## 2018-11-24 ENCOUNTER — Other Ambulatory Visit: Payer: Medicare Other

## 2018-11-24 DIAGNOSIS — Z7901 Long term (current) use of anticoagulants: Secondary | ICD-10-CM

## 2018-11-25 LAB — PROTIME-INR
INR: 2.5 — AB (ref 0.8–1.2)
PROTHROMBIN TIME: 23.8 s — AB (ref 9.1–12.0)

## 2018-12-01 ENCOUNTER — Ambulatory Visit (INDEPENDENT_AMBULATORY_CARE_PROVIDER_SITE_OTHER): Payer: Medicare Other

## 2018-12-01 VITALS — BP 122/72 | HR 83 | Temp 98.2°F | Resp 16 | Ht 68.0 in | Wt 189.6 lb

## 2018-12-01 DIAGNOSIS — Z23 Encounter for immunization: Secondary | ICD-10-CM | POA: Diagnosis not present

## 2018-12-01 DIAGNOSIS — Z0000 Encounter for general adult medical examination without abnormal findings: Secondary | ICD-10-CM | POA: Diagnosis not present

## 2018-12-01 NOTE — Progress Notes (Signed)
Subjective:   [name] is a 67 y.o. female who presents for Medicare Annual (Subsequent) preventive examination.  Review of Systems:   Cardiac Risk Factors include: advanced age (>46men, >95 women);dyslipidemia;hypertension     Objective:     Vitals: BP 122/72 (BP Location: Left Arm, Patient Position: Sitting, Cuff Size: Normal)   Pulse 83   Temp 98.2 F (36.8 C) (Oral)   Resp 16   Ht 5\' 8"  (1.727 m)   Wt 189 lb 9.6 oz (86 kg)   SpO2 97%   BMI 28.83 kg/m   Body mass index is 28.83 kg/m.  Advanced Directives 12/01/2018 11/21/2017 10/15/2017 03/17/2016 11/09/2015  Does Patient Have a Medical Advance Directive? No Yes No Yes Yes  Type of Advance Directive - [place];Living will - Living will;Healthcare Power of [place];Living will  Does patient want to make changes to medical advance directive? Yes (MAU/Ambulatory/Procedural Areas - Information given) - - - -  Copy of Healthcare Power of Attorney in Chart? - No - copy requested - - No - copy requested  Would patient like information on creating a medical advance directive? - - No - Patient declined - -    Tobacco Social History   Tobacco Use  Smoking Status Never Smoker  Smokeless Tobacco Never Used  Tobacco Comment   Smoking cessation materials not required     Counseling given: Not Answered Comment: Smoking cessation materials not required   Clinical Intake:  Pre-visit preparation completed: Yes  Pain : No/denies pain     Nutritional Status: BMI 25 -29 Overweight Nutritional Risks: None Diabetes: No  How often do you need to have someone help you when you read instructions, pamphlets, or other written materials from your doctor or pharmacy?: 1 - Never What is the last grade level you completed in school?: master's degree  Interpreter Needed?: No  Information entered by :: Clemetine Marker LPN  Past Medical History:  Diagnosis Date  . Depression     . Hyperlipidemia   . Hypertension   . Stroke ([place])   . Thyroid disease    Past Surgical History:  Procedure Laterality Date  . CESAREAN SECTION    . CHOLECYSTECTOMY    . COLONOSCOPY  2012   repeat in 10 yrs  . KNEE ARTHROSCOPY W/ MENISCAL REPAIR Left    Family History  Problem Relation Age of Onset  . Stroke Father   . Breast cancer Maternal Grandmother   . Kidney cancer Mother    Social History   Socioeconomic History  . Marital status: Married    Spouse name: Not on file  . Number of children: 1  . Years of education: Not on file  . Highest education level: Master's degree (e.g., MA, MS, MEng, MEd, MSW, MBA)  Occupational History  . Occupation: Retired  Scientific laboratory technician  . Financial resource strain: Not hard at all  . Food insecurity:    Worry: Never true    Inability: Never true  . Transportation needs:    Medical: No    Non-medical: No  Tobacco Use  . Smoking status: Never Smoker  . Smokeless tobacco: Never Used  . Tobacco comment: Smoking cessation materials not required  Substance and Sexual Activity  . Alcohol use: Yes    Alcohol/week: 1.0 standard drinks    Types: 1 Glasses of wine per week    Comment: occasional  . Drug use: No  . Sexual activity: Not Currently  Birth control/protection: Post-menopausal  Lifestyle  . Physical activity:    Days per week: 0 days    Minutes per session: 0 min  . Stress: Not at all  Relationships  . Social connections:    Talks on phone: More than three times a week    Gets together: Once a week    Attends religious service: 1 to 4 times per year    Active member of club or organization: No    Attends meetings of clubs or organizations: Never    Relationship status: Married  Other Topics Concern  . Not on file  Social History Narrative  . Not on file    Outpatient Encounter Medications as of 12/01/2018  Medication Sig  . escitalopram (LEXAPRO) 20 MG tablet TAKE 1 TABLET BY MOUTH EVERY DAY  . Influenza vac  split quadrivalent PF (FLUARIX) 0.5 ML injection Fluarix Quad 2017-2018 (PF) 60 mcg (15 mcg x 4)/0.5 mL IM syringe  TO BE ADMINISTERED BY PHARMACIST FOR IMMUNIZATION  . levothyroxine (SYNTHROID, LEVOTHROID) 75 MCG tablet Take 1 tablet (75 mcg total) by mouth daily.  . ramipril (ALTACE) 2.5 MG capsule Take 1 capsule (2.5 mg total) by mouth daily.  . traZODone (DESYREL) 50 MG tablet Take 1 tablet (50 mg total) by mouth daily.  Marland Kitchen warfarin (COUMADIN) 3 MG tablet Take 1 tablet (3 mg total) by mouth daily.  . [DISCONTINUED] warfarin (COUMADIN) 1 MG tablet Take 1 tablet (1 mg total) by mouth daily.   No facility-administered encounter medications on file as of 12/01/2018.     Activities of Daily Living In your present state of health, do you have any difficulty performing the following activities: 12/01/2018  Hearing? N  Comment declines hearing aids  Vision? N  Comment wears glasses  Difficulty concentrating or making decisions? N  Walking or climbing stairs? N  Dressing or bathing? N  Doing errands, shopping? N  Preparing Food and eating ? N  Using the Toilet? N  In the past six months, have you accidently leaked urine? Y  Comment wears pads for protection, overactive bladder  Do you have problems with loss of bowel control? N  Managing your Medications? N  Managing your Finances? N  Housekeeping or managing your Housekeeping? N  Some recent data might be hidden    Patient Care Team: Juline Patch, MD as PCP - General (Family Medicine)    Assessment:   This is a routine wellness examination for [name].  Exercise Activities and Dietary recommendations Current Exercise Habits: Home exercise routine, Type of exercise: Other - see comments(pt active at home and in her yard), Intensity: Mild, Exercise limited by: None identified  Goals    . Exercise 150 min/wk Moderate Activity     Recommend to exercise at least 150 minutes per week       Fall Risk Fall Risk  12/01/2018  11/21/2017 09/09/2017 06/24/2017 11/09/2015  Falls in the past year? 0 Yes No No No  Comment - using weed eater and stumbled into pond - [place] Telephone Survey: data to providers prior to load -  Number falls in past yr: 0 1 - - -  Injury with Fall? - No - - -  Follow up - Education provided;Falls prevention discussed - - -   FALL RISK PREVENTION PERTAINING TO THE HOME:  Any stairs in or around the home WITH handrails? Yes  Home free of loose throw rugs in walkways, pet beds, electrical cords, etc? Yes  Adequate lighting in  your home to reduce risk of falls? Yes   ASSISTIVE DEVICES UTILIZED TO PREVENT FALLS:  Life alert? No  Use of a cane, walker or w/c? No  Grab bars in the bathroom? Yes  Shower chair or bench in shower? Yes  Elevated toilet seat or a handicapped toilet? Yes   DME ORDERS:  DME order needed?  No   TIMED UP AND GO:  Was the test performed? Yes .  Length of time to ambulate 10 feet: 5 sec.   GAIT:  Appearance of gait: Gait stead-fast and without the use of an assistive device. Education: Fall risk prevention has been discussed.  Intervention(s) required? No    Depression Screen PHQ 2/9 Scores 12/01/2018 07/10/2018 11/21/2017 09/09/2017  PHQ - 2 Score 0 1 0 0  PHQ- 9 Score 0 3 - 1     Cognitive Function     6CIT Screen 12/01/2018 11/21/2017  What Year? 0 points 0 points  What month? 0 points 0 points  What time? 0 points 0 points  Count back from 20 0 points 0 points  Months in reverse 0 points 0 points  Repeat phrase 0 points 2 points  Total Score 0 2    Immunization History  Administered Date(s) Administered  . Influenza, High Dose Seasonal PF 08/21/2017  . Influenza,inj,Quad PF,6+ Mos 08/19/2015  . Pneumococcal Conjugate-13 11/12/2016    Qualifies for Shingles Vaccine? Yes . Due for Shingrix. Education has been provided regarding the importance of this vaccine. Pt has been advised to call insurance company to determine out of pocket expense.  Advised may also receive vaccine at local pharmacy or Health Dept. Verbalized acceptance and understanding.  Tdap: Up to date  Flu Vaccine:Up to date:  done at [place] per pt, requesting records.   Pneumococcal Vaccine: Due for Pneumococcal vaccine. Does the patient want to receive this vaccine today?  Yes . Education has been provided regarding the importance of this vaccine but still declined. Advised may receive this vaccine at local pharmacy or Health Dept. Aware to provide a copy of the vaccination record if obtained from local pharmacy or Health Dept. Verbalized acceptance and understanding.   Screening Tests Health Maintenance  Topic Date Due  . PNA vac Low Risk Adult (2 of 2 - PPSV23) 11/12/2017  . MAMMOGRAM  06/12/2018  . INFLUENZA VACCINE  07/03/2018  . TETANUS/TDAP  11/03/2024  . COLONOSCOPY  10/11/2027  . DEXA SCAN  Completed  . Hepatitis C Screening  Completed    Cancer Screenings:  Colorectal Screening: Completed 10/10/17. Repeat every 10 years;   Mammogram: Completed 06/12/17. Repeat every year; Pt has upcoming appt with GYN who will order mammogram.   Bone Density: Completed 10/09/17. Results reflect OSTEOPOROSIS. Repeat every 2 years.   Lung Cancer Screening: (Low Dose CT Chest recommended if Age 71-80 years, 30 pack-year currently smoking OR have quit w/in 15years.) does not qualify.   Additional Screening:  Hepatitis C Screening: does qualify; Completed 11/21/17  Vision Screening: Recommended annual ophthalmology exams for early detection of glaucoma and other disorders of the eye. Is the patient up to date with their annual eye exam?  Yes  Who is the provider or what is the name of the office in which the pt attends annual eye exams? [place] Screening: Recommended annual dental exams for proper oral hygiene  Community Resource Referral:  CRR required this visit?  No      Plan:    I have personally  reviewed and addressed the  Medicare Annual Wellness questionnaire and have noted the following in the patient's chart:  A. Medical and social history B. Use of alcohol, tobacco or illicit drugs  C. Current medications and supplements D. Functional ability and status E.  Nutritional status F.  Physical activity G. Advance directives H. List of other physicians I.  Hospitalizations, surgeries, and ER visits in previous 12 months J.  [place] such as hearing and vision if needed, cognitive and depression L. Referrals and appointments   In addition, I have reviewed and discussed with patient certain preventive protocols, quality metrics, and best practice recommendations. A written personalized care plan for preventive services as well as general preventive health recommendations were provided to patient.   Signed,  Clemetine Marker, LPN Nurse Health Advisor   Nurse Notes:

## 2018-12-01 NOTE — Patient Instructions (Signed)
[name] , Thank you for taking time to come for your Medicare Wellness Visit. I appreciate your ongoing commitment to your health goals. Please review the following plan we discussed and let me know if I can assist you in the future.   Screening recommendations/referrals: Colonoscopy: done 10/10/17 repeat in 2028 Mammogram: done 06/09/17 please have GYN order mammogram Bone Density: done 10/09/17 repeat in 2020 Recommended yearly ophthalmology/optometry visit for glaucoma screening and checkup Recommended yearly dental visit for hygiene and checkup  Vaccinations: Influenza vaccine: done at [facility] Pneumococcal vaccine: done today Tdap vaccine: done 12/16/13 Shingles vaccine: Shingrix discussed. Please contact your pharmacy for coverage information.     Advanced directives: Advance directive discussed with you today. I have provided a copy for you to complete at home and have notarized. Once this is complete please bring a copy in to our office so we can scan it into your chart.  Conditions/risks identified: Recommend increasing physical activity to 150 minutes per week.  Next appointment: Please follow up in one year for your Medicare Annual Wellness visit.     Preventive Care 42 Years and Older, Female Preventive care refers to lifestyle choices and visits with your health care provider that can promote health and wellness. What does preventive care include?  A yearly physical exam. This is also called an annual well check.  Dental exams once or twice a year.  Routine eye exams. Ask your health care provider how often you should have your eyes checked.  Personal lifestyle choices, including:  Daily care of your teeth and gums.  Regular physical activity.  Eating a healthy diet.  Avoiding tobacco and drug use.  Limiting alcohol use.  Practicing safe sex.  Taking low-dose aspirin every day.  Taking vitamin and mineral supplements as recommended by your health care  provider. What happens during an annual well check? The services and screenings done by your health care provider during your annual well check will depend on your age, overall health, lifestyle risk factors, and family history of disease. Counseling  Your health care provider may ask you questions about your:  Alcohol use.  Tobacco use.  Drug use.  Emotional well-being.  Home and relationship well-being.  Sexual activity.  Eating habits.  History of falls.  Memory and ability to understand (cognition).  Work and work Statistician.  Reproductive health. Screening  You may have the following tests or measurements:  Height, weight, and BMI.  Blood pressure.  Lipid and cholesterol levels. These may be checked every 5 years, or more frequently if you are over 54 years old.  Skin check.  Lung cancer screening. You may have this screening every year starting at age 50 if you have a 30-pack-year history of smoking and currently smoke or have quit within the past 15 years.  Fecal occult blood test (FOBT) of the stool. You may have this test every year starting at age 10.  Flexible sigmoidoscopy or colonoscopy. You may have a sigmoidoscopy every 5 years or a colonoscopy every 10 years starting at age 35.  Hepatitis C blood test.  Hepatitis B blood test.  Sexually transmitted disease (STD) testing.  Diabetes screening. This is done by checking your blood sugar (glucose) after you have not eaten for a while (fasting). You may have this done every 1-3 years.  Bone density scan. This is done to screen for osteoporosis. You may have this done starting at age 31.  Mammogram. This may be done every 1-2 years. Talk to  your health care provider about how often you should have regular mammograms. Talk with your health care provider about your test results, treatment options, and if necessary, the need for more tests. Vaccines  Your health care provider may recommend certain  vaccines, such as:  Influenza vaccine. This is recommended every year.  Tetanus, diphtheria, and acellular pertussis (Tdap, Td) vaccine. You may need a Td booster every 10 years.  Zoster vaccine. You may need this after age 50.  Pneumococcal 13-valent conjugate (PCV13) vaccine. One dose is recommended after age 63.  Pneumococcal polysaccharide (PPSV23) vaccine. One dose is recommended after age 49. Talk to your health care provider about which screenings and vaccines you need and how often you need them. This information is not intended to replace advice given to you by your health care provider. Make sure you discuss any questions you have with your health care provider. Document Released: 12/16/2015 Document Revised: 08/08/2016 Document Reviewed: 09/20/2015 Elsevier Interactive Patient Education  2017 [employer] Prevention in the Home Falls can cause injuries. They can happen to people of all ages. There are many things you can do to make your home safe and to help prevent falls. What can I do on the outside of my home?  Regularly fix the edges of walkways and driveways and fix any cracks.  Remove anything that might make you trip as you walk through a door, such as a raised step or threshold.  Trim any bushes or trees on the path to your home.  Use bright outdoor lighting.  Clear any walking paths of anything that might make someone trip, such as rocks or tools.  Regularly check to see if handrails are loose or broken. Make sure that both sides of any steps have handrails.  Any raised decks and porches should have guardrails on the edges.  Have any leaves, snow, or ice cleared regularly.  Use sand or salt on walking paths during winter.  Clean up any spills in your garage right away. This includes oil or grease spills. What can I do in the bathroom?  Use night lights.  Install grab bars by the toilet and in the tub and shower. Do not use towel bars as grab  bars.  Use non-skid mats or decals in the tub or shower.  If you need to sit down in the shower, use a plastic, non-slip stool.  Keep the floor dry. Clean up any water that spills on the floor as soon as it happens.  Remove soap buildup in the tub or shower regularly.  Attach bath mats securely with double-sided non-slip rug tape.  Do not have throw rugs and other things on the floor that can make you trip. What can I do in the bedroom?  Use night lights.  Make sure that you have a light by your bed that is easy to reach.  Do not use any sheets or blankets that are too big for your bed. They should not hang down onto the floor.  Have a firm chair that has side arms. You can use this for support while you get dressed.  Do not have throw rugs and other things on the floor that can make you trip. What can I do in the kitchen?  Clean up any spills right away.  Avoid walking on wet floors.  Keep items that you use a lot in easy-to-reach places.  If you need to reach something above you, use a strong step stool that has  a grab bar.  Keep electrical cords out of the way.  Do not use floor polish or wax that makes floors slippery. If you must use wax, use non-skid floor wax.  Do not have throw rugs and other things on the floor that can make you trip. What can I do with my stairs?  Do not leave any items on the stairs.  Make sure that there are handrails on both sides of the stairs and use them. Fix handrails that are broken or loose. Make sure that handrails are as long as the stairways.  Check any carpeting to make sure that it is firmly attached to the stairs. Fix any carpet that is loose or worn.  Avoid having throw rugs at the top or bottom of the stairs. If you do have throw rugs, attach them to the floor with carpet tape.  Make sure that you have a light switch at the top of the stairs and the bottom of the stairs. If you do not have them, ask someone to add them for  you. What else can I do to help prevent falls?  Wear shoes that:  Do not have high heels.  Have rubber bottoms.  Are comfortable and fit you well.  Are closed at the toe. Do not wear sandals.  If you use a stepladder:  Make sure that it is fully opened. Do not climb a closed stepladder.  Make sure that both sides of the stepladder are locked into place.  Ask someone to hold it for you, if possible.  Clearly mark and make sure that you can see:  Any grab bars or handrails.  First and last steps.  Where the edge of each step is.  Use tools that help you move around (mobility aids) if they are needed. These include:  Canes.  Walkers.  Scooters.  Crutches.  Turn on the lights when you go into a dark area. Replace any light bulbs as soon as they burn out.  Set up your furniture so you have a clear path. Avoid moving your furniture around.  If any of your floors are uneven, fix them.  If there are any pets around you, be aware of where they are.  Review your medicines with your doctor. Some medicines can make you feel dizzy. This can increase your chance of falling. Ask your doctor what other things that you can do to help prevent falls. This information is not intended to replace advice given to you by your health care provider. Make sure you discuss any questions you have with your health care provider. Document Released: 09/15/2009 Document Revised: 04/26/2016 Document Reviewed: 12/24/2014 Elsevier Interactive Patient Education  2017 [employer].

## 2018-12-08 ENCOUNTER — Other Ambulatory Visit
Admission: RE | Admit: 2018-12-08 | Discharge: 2018-12-08 | Disposition: A | Discharge status: Home / Self Care | Payer: Medicare Other | Attending: Family Medicine | Admitting: Family Medicine

## 2018-12-08 ENCOUNTER — Encounter: Payer: Self-pay | Admitting: Family Medicine

## 2018-12-08 ENCOUNTER — Ambulatory Visit (INDEPENDENT_AMBULATORY_CARE_PROVIDER_SITE_OTHER): Payer: Medicare Other | Admitting: Family Medicine

## 2018-12-08 VITALS — BP 120/76 | HR 64 | Ht 68.0 in | Wt 186.0 lb

## 2018-12-08 DIAGNOSIS — T148XXA Other injury of unspecified body region, initial encounter: Secondary | ICD-10-CM | POA: Diagnosis not present

## 2018-12-08 DIAGNOSIS — Z7901 Long term (current) use of anticoagulants: Secondary | ICD-10-CM

## 2018-12-08 DIAGNOSIS — R197 Diarrhea, unspecified: Secondary | ICD-10-CM | POA: Diagnosis not present

## 2018-12-08 LAB — CBC WITH DIFFERENTIAL/PLATELET
Abs Immature Granulocytes: 0.01 10*3/uL (ref 0.00–0.07)
Basophils Absolute: 0.1 10*3/uL (ref 0.0–0.1)
Basophils Relative: 1 %
Eosinophils Absolute: 0.2 10*3/uL (ref 0.0–0.5)
Eosinophils Relative: 3 %
HEMATOCRIT: 34.9 % — AB (ref 36.0–46.0)
Hemoglobin: 11.6 g/dL — ABNORMAL LOW (ref 12.0–15.0)
Immature Granulocytes: 0 %
LYMPHS ABS: 1.5 10*3/uL (ref 0.7–4.0)
Lymphocytes Relative: 24 %
MCH: 32.4 pg (ref 26.0–34.0)
MCHC: 33.2 g/dL (ref 30.0–36.0)
MCV: 97.5 fL (ref 80.0–100.0)
MONO ABS: 0.6 10*3/uL (ref 0.1–1.0)
MONOS PCT: 9 %
NEUTROS ABS: 4 10*3/uL (ref 1.7–7.7)
Neutrophils Relative %: 63 %
Platelets: 278 10*3/uL (ref 150–400)
RBC: 3.58 MIL/uL — ABNORMAL LOW (ref 3.87–5.11)
RDW: 11.9 % (ref 11.5–15.5)
WBC: 6.3 10*3/uL (ref 4.0–10.5)
nRBC: 0 % (ref 0.0–0.2)

## 2018-12-08 LAB — PROTIME-INR
INR: 2.39
Prothrombin Time: 25.7 seconds — ABNORMAL HIGH (ref 11.4–15.2)

## 2018-12-08 MED ORDER — MUPIROCIN 2 % EX OINT: 1.0000 "application " | TOPICAL_OINTMENT | Freq: Two times a day (BID) | CUTANEOUS | 0 refills | Status: DC

## 2018-12-08 NOTE — Progress Notes (Signed)
Date:  12/08/2018   Name:  [name] [facility]   DOB:  1950/12/16   MRN:  [id number]   Chief Complaint: Diarrhea (has been having x 1 week- feels better today. only had 1 episode today of diarrhea) and Blister (shoes rubbed a blister on side of the L) foot- red )  Diarrhea   This is a new problem. The current episode started in the past 7 days. The problem occurs 5 to 10 times per day. The problem has been unchanged. Diarrhea characteristics: dark. The patient states that diarrhea awakens her from sleep. Associated symptoms include abdominal pain, bloating and increased flatus. Pertinent negatives include no arthralgias, chills, coughing, fever, headaches, myalgias, sweats, URI, vomiting or weight loss. Risk factors include recent antibiotic use (12/13 x 10 days). She has tried bismuth subsalicylate, anti-motility drug and increased fluids (ginger ale) for the symptoms. The treatment provided no relief. coumadin    Review of Systems  Constitutional: Negative.  Negative for chills, fatigue, fever, unexpected weight change and weight loss.  HENT: Negative for congestion, ear discharge, ear pain, rhinorrhea, sinus pressure, sneezing and sore throat.   Eyes: Negative for photophobia, pain, discharge, redness and itching.  Respiratory: Negative for cough, shortness of breath, wheezing and stridor.   Gastrointestinal: Positive for abdominal pain, bloating, diarrhea and flatus. Negative for blood in stool, constipation, nausea and vomiting.  Endocrine: Negative for cold intolerance, heat intolerance, polydipsia, polyphagia and polyuria.  Genitourinary: Negative for dysuria, flank pain, frequency, hematuria, menstrual problem, pelvic pain, urgency, vaginal bleeding and vaginal discharge.  Musculoskeletal: Negative for arthralgias, back pain and myalgias.  Skin: Negative for rash.  Allergic/Immunologic: Negative for environmental allergies and food allergies.  Neurological: Negative for dizziness,  weakness, light-headedness, numbness and headaches.  Hematological: Negative for adenopathy. Does not bruise/bleed easily.  Psychiatric/Behavioral: Negative for dysphoric mood. The patient is not nervous/anxious.     Patient Active Problem List   Diagnosis Date Noted  . Age-related osteoporosis without current pathological fracture 01/20/2018  . Osteoporosis 11/12/2017  . Urinary incontinence 05/28/2017  . Knee pain 05/08/2017  . Muscle weakness 05/08/2017  . Sprain of MCL (medial collateral ligament) of knee 05/08/2017  . Adult hypothyroidism 10/23/2016  . Essential hypertension 10/23/2016  . Gastroesophageal reflux disease without esophagitis 10/23/2016  . Recurrent major depressive disorder, in full remission ([place]) 10/23/2016  . Anticoagulant long-term use 10/23/2016    Allergies  Allergen Reactions  . Penicillin G Rash    Past Surgical History:  Procedure Laterality Date  . CESAREAN SECTION    . CHOLECYSTECTOMY    . COLONOSCOPY  2012   repeat in 10 yrs  . KNEE ARTHROSCOPY W/ MENISCAL REPAIR Left     Social History   Tobacco Use  . Smoking status: Never Smoker  . Smokeless tobacco: Never Used  . Tobacco comment: Smoking cessation materials not required  Substance Use Topics  . Alcohol use: Yes    Alcohol/week: 1.0 standard drinks    Types: 1 Glasses of wine per week    Comment: occasional  . Drug use: No     Medication list has been reviewed and updated.  Current Meds  Medication Sig  . escitalopram (LEXAPRO) 20 MG tablet TAKE 1 TABLET BY MOUTH EVERY DAY  . levothyroxine (SYNTHROID, LEVOTHROID) 75 MCG tablet Take 1 tablet (75 mcg total) by mouth daily.  . ramipril (ALTACE) 2.5 MG capsule Take 1 capsule (2.5 mg total) by mouth daily.  . traZODone (DESYREL) 50 MG tablet Take 1  tablet (50 mg total) by mouth daily.  Marland Kitchen warfarin (COUMADIN) 3 MG tablet Take 1 tablet (3 mg total) by mouth daily.    PHQ 2/9 Scores 12/08/2018 12/01/2018 07/10/2018 11/21/2017  PHQ - 2  Score 0 0 1 0  PHQ- 9 Score 0 0 3 -    Physical Exam Vitals signs and nursing note reviewed.  Constitutional:      General: She is not in acute distress.    Appearance: She is not diaphoretic.  HENT:     Head: Normocephalic and atraumatic.     Right Ear: Tympanic membrane, ear canal and external ear normal.     Left Ear: Tympanic membrane and external ear normal.     Nose: Nose normal.     Mouth/Throat:     Mouth: Mucous membranes are dry.     Pharynx: No posterior oropharyngeal erythema.  Eyes:     General:        Right eye: No discharge.        Left eye: No discharge.     Conjunctiva/sclera: Conjunctivae normal.     Pupils: Pupils are equal, round, and reactive to light.  Neck:     Musculoskeletal: Normal range of motion and neck supple.     Thyroid: No thyromegaly.     Vascular: No JVD.  Cardiovascular:     Rate and Rhythm: Normal rate and regular rhythm.     Pulses: Normal pulses.     Heart sounds: Normal heart sounds. No murmur. No friction rub. No gallop.   Pulmonary:     Effort: Pulmonary effort is normal.     Breath sounds: Normal breath sounds. No wheezing, rhonchi or rales.  Chest:     Chest wall: No tenderness.  Abdominal:     General: Bowel sounds are normal.     Palpations: Abdomen is soft. There is no mass.     Tenderness: There is no abdominal tenderness. There is no guarding.  Musculoskeletal: Normal range of motion.  Lymphadenopathy:     Cervical: No cervical adenopathy.  Skin:    General: Skin is warm and dry.     Comments: Nail beds pale  Neurological:     Mental Status: She is alert.     Cranial Nerves: No cranial nerve deficit.     Deep Tendon Reflexes: Reflexes are normal and symmetric.     BP 120/76   Pulse 64   Ht 5\' 8"  (1.727 m)   Wt 186 lb (84.4 kg)   BMI 28.28 kg/m   Assessment and Plan: 1. Diarrhea, unspecified type And has had diarrhea for the past 5 days with significant watery stools.  Patient is also noted some dark-colored  stools and even though was on to be  consistency of the stool suggested the possibility of melena.  CBC with differential to evaluate for anemia and initiate evaluation with C. difficile O&P and stool culture. - CBC with Differential/Platelet - Cdiff NAA+O+P+Stool Culture  2. Anticoagulant long-term use Even though her last PT/INR was in the 3 range since this is been more than 10 days will evaluate PT/INR with the possibility of upper GI bleed. - CBC with Differential/Platelet - INR/PT  3. Abrasion And has had a abrasion/blister of the left ankle.  Will avoid using antibiotic at this time and apply Bactroban ointment. - mupirocin ointment (BACTROBAN) 2 %; Apply 1 application topically 2 (two) times daily.  Dispense: 22 g; Refill: 0

## 2018-12-08 NOTE — Patient Instructions (Signed)
Diarrhea, Adult  Diarrhea is frequent loose and watery bowel movements. Diarrhea can make you feel weak and cause you to become dehydrated. Dehydration can make you tired and thirsty, cause you to have a dry mouth, and decrease how often you urinate.  Diarrhea typically lasts 2-3 days. However, it can last longer if it is a sign of something more serious. It is important to treat your diarrhea as told by your health care provider.  Follow these instructions at home:  Eating and drinking         Follow these recommendations as told by your health care provider:  · Take an oral rehydration solution (ORS). This is an over-the-counter medicine that helps return your body to its normal balance of nutrients and water. It is found at pharmacies and retail stores.  · Drink plenty of fluids, such as water, ice chips, diluted fruit juice, and low-calorie sports drinks. You can drink milk also, if desired.  · Avoid drinking fluids that contain a lot of sugar or caffeine, such as energy drinks, sports drinks, and soda.  · Eat bland, easy-to-digest foods in small amounts as you are able. These foods include bananas, applesauce, rice, lean meats, toast, and crackers.  · Avoid alcohol.  · Avoid spicy or fatty foods.    Medicines  · Take over-the-counter and prescription medicines only as told by your health care provider.  · If you were prescribed an antibiotic medicine, take it as told by your health care provider. Do not stop using the antibiotic even if you start to feel better.  General instructions    · Wash your hands often using soap and water. If soap and water are not available, use a hand sanitizer. Others in the household should wash their hands as well. Hands should be washed:  ? After using the toilet or changing a diaper.  ? Before preparing, cooking, or serving food.  ? While caring for a sick person or while visiting someone in a hospital.  · Drink enough fluid to keep your urine pale yellow.  · Rest at home while  you recover.  · Watch your condition for any changes.  · Take a warm bath to relieve any burning or pain from frequent diarrhea episodes.  · Keep all follow-up visits as told by your health care provider. This is important.  Contact a health care provider if:  · You have a fever.  · Your diarrhea gets worse.  · You have new symptoms.  · You cannot keep fluids down.  · You feel light-headed or dizzy.  · You have a headache.  · You have muscle cramps.  Get help right away if:  · You have chest pain.  · You feel extremely weak or you faint.  · You have bloody or black stools or stools that look like tar.  · You have severe pain, cramping, or bloating in your abdomen.  · You have trouble breathing or you are breathing very quickly.  · Your heart is beating very quickly.  · Your skin feels cold and clammy.  · You feel confused.  · You have signs of dehydration, such as:  ? Dark urine, very little urine, or no urine.  ? Cracked lips.  ? Dry mouth.  ? Sunken eyes.  ? Sleepiness.  ? Weakness.  Summary  · Diarrhea is frequent loose and watery bowel movements. Diarrhea can make you feel weak and cause you to become dehydrated.  · Drink enough fluids   to keep your urine pale yellow.  · Make sure that you wash your hands after using the toilet. If soap and water are not available, use hand sanitizer.  · Contact a health care provider if your diarrhea gets worse or you have new symptoms.  · Get help right away if you have signs of dehydration.  This information is not intended to replace advice given to you by your health care provider. Make sure you discuss any questions you have with your health care provider.  Document Released: 11/09/2002 Document Revised: 04/25/2018 Document Reviewed: 04/25/2018  Elsevier Interactive Patient Education © 2019 [employer]

## 2018-12-09 ENCOUNTER — Other Ambulatory Visit: Payer: Self-pay

## 2018-12-09 ENCOUNTER — Other Ambulatory Visit
Admission: RE | Admit: 2018-12-09 | Discharge: 2018-12-09 | Disposition: A | Discharge status: Home / Self Care | Payer: Medicare Other | Source: Ambulatory Visit | Attending: Family Medicine | Admitting: Family Medicine

## 2018-12-09 DIAGNOSIS — E860 Dehydration: Secondary | ICD-10-CM | POA: Insufficient documentation

## 2018-12-09 DIAGNOSIS — R197 Diarrhea, unspecified: Secondary | ICD-10-CM | POA: Diagnosis not present

## 2018-12-09 DIAGNOSIS — Z7901 Long term (current) use of anticoagulants: Secondary | ICD-10-CM | POA: Insufficient documentation

## 2018-12-09 DIAGNOSIS — R195 Other fecal abnormalities: Secondary | ICD-10-CM | POA: Diagnosis not present

## 2018-12-09 LAB — GASTROINTESTINAL PANEL BY PCR, STOOL (REPLACES STOOL CULTURE)

## 2018-12-09 LAB — C DIFFICILE QUICK SCREEN W PCR REFLEX
C DIFFICILE (CDIFF) TOXIN: POSITIVE — AB
C Diff antigen: POSITIVE — AB
C Diff interpretation: DETECTED

## 2018-12-09 MED ORDER — METRONIDAZOLE 500 MG PO TABS: 500.0000 mg | ORAL_TABLET | Freq: Three times a day (TID) | ORAL | 0 refills | Status: AC

## 2018-12-23 ENCOUNTER — Telehealth: Payer: Self-pay

## 2018-12-23 ENCOUNTER — Other Ambulatory Visit: Payer: Self-pay

## 2018-12-23 DIAGNOSIS — R197 Diarrhea, unspecified: Secondary | ICD-10-CM

## 2018-12-23 NOTE — Telephone Encounter (Signed)
Pt was treated for C-Diff. Had 2 liquid stools this morning and is concerned for C-Diff again. Was told to avoid dairy products, take Immodium and push fluids. Will send GI referral over

## 2018-12-24 ENCOUNTER — Encounter: Payer: Self-pay | Admitting: Family Medicine

## 2018-12-24 ENCOUNTER — Ambulatory Visit (INDEPENDENT_AMBULATORY_CARE_PROVIDER_SITE_OTHER): Payer: Medicare Other | Admitting: Family Medicine

## 2018-12-24 VITALS — BP 102/62 | HR 72 | Temp 98.1°F | Ht 68.0 in | Wt 184.0 lb

## 2018-12-24 DIAGNOSIS — R197 Diarrhea, unspecified: Secondary | ICD-10-CM

## 2018-12-24 NOTE — Progress Notes (Signed)
Date:  12/24/2018   Name:  [name] [facility]   DOB:  1951-10-27   MRN:  [id number]   Chief Complaint: vomitting and diarrhea (episodes of diarrhea yesterday and vomitting today. Has not been eating anything. fever of 102 this am.)  Diarrhea   This is a recurrent problem. The current episode started yesterday (last night). The problem occurs 2 to 4 times per day. The stool consistency is described as mucous. The patient states that diarrhea awakens her from sleep. Associated symptoms include a fever and weight loss. Pertinent negatives include no abdominal pain, arthralgias, bloating, chills, coughing, headaches, increased  flatus, myalgias, sweats, URI or vomiting. Risk factors include recent antibiotic use (septra per dentist for 10 dayfor perio surg/ dec 13th then treat with metronidazole for 10/ ). The treatment provided moderate (relief on flagyl by day 2/ took full course/ then resumed yesterday) relief. There is no history of bowel resection, inflammatory bowel disease, irritable bowel syndrome, malabsorption, a recent abdominal surgery or short gut syndrome.    Review of Systems  Constitutional: Positive for fever and weight loss. Negative for chills, fatigue and unexpected weight change.  HENT: Negative for congestion, ear discharge, ear pain, rhinorrhea, sinus pressure, sneezing and sore throat.   Eyes: Negative for photophobia, pain, discharge, redness and itching.  Respiratory: Negative for cough, shortness of breath, wheezing and stridor.   Gastrointestinal: Positive for diarrhea. Negative for abdominal pain, bloating, blood in stool, constipation, flatus, nausea and vomiting.  Endocrine: Negative for cold intolerance, heat intolerance, polydipsia, polyphagia and polyuria.  Genitourinary: Negative for dysuria, flank pain, frequency, hematuria, menstrual problem, pelvic pain, urgency, vaginal bleeding and vaginal discharge.  Musculoskeletal: Negative for arthralgias, back pain and  myalgias.  Skin: Negative for rash.  Allergic/Immunologic: Negative for environmental allergies and food allergies.  Neurological: Negative for dizziness, weakness, light-headedness, numbness and headaches.  Hematological: Negative for adenopathy. Does not bruise/bleed easily.  Psychiatric/Behavioral: Negative for dysphoric mood. The patient is not nervous/anxious.     Patient Active Problem List   Diagnosis Date Noted  . Age-related osteoporosis without current pathological fracture 01/20/2018  . Osteoporosis 11/12/2017  . Urinary incontinence 05/28/2017  . Knee pain 05/08/2017  . Muscle weakness 05/08/2017  . Sprain of MCL (medial collateral ligament) of knee 05/08/2017  . Adult hypothyroidism 10/23/2016  . Essential hypertension 10/23/2016  . Gastroesophageal reflux disease without esophagitis 10/23/2016  . Recurrent major depressive disorder, in full remission ([place]) 10/23/2016  . Anticoagulant long-term use 10/23/2016    Allergies  Allergen Reactions  . Penicillin G Rash    Past Surgical History:  Procedure Laterality Date  . CESAREAN SECTION    . CHOLECYSTECTOMY    . COLONOSCOPY  2012   repeat in 10 yrs  . KNEE ARTHROSCOPY W/ MENISCAL REPAIR Left     Social History   Tobacco Use  . Smoking status: Never Smoker  . Smokeless tobacco: Never Used  . Tobacco comment: Smoking cessation materials not required  Substance Use Topics  . Alcohol use: Yes    Alcohol/week: 1.0 standard drinks    Types: 1 Glasses of wine per week    Comment: occasional  . Drug use: No     Medication list has been reviewed and updated.  Current Meds  Medication Sig  . escitalopram (LEXAPRO) 20 MG tablet TAKE 1 TABLET BY MOUTH EVERY DAY  . levothyroxine (SYNTHROID, LEVOTHROID) 75 MCG tablet Take 1 tablet (75 mcg total) by mouth daily.  . mupirocin ointment (BACTROBAN) 2 %  Apply 1 application topically 2 (two) times daily.  . ramipril (ALTACE) 2.5 MG capsule Take 1 capsule (2.5 mg  total) by mouth daily.  . traZODone (DESYREL) 50 MG tablet Take 1 tablet (50 mg total) by mouth daily.  Marland Kitchen warfarin (COUMADIN) 3 MG tablet Take 1 tablet (3 mg total) by mouth daily.    PHQ 2/9 Scores 12/24/2018 12/08/2018 12/01/2018 07/10/2018  PHQ - 2 Score 0 0 0 1  PHQ- 9 Score 0 0 0 3    Physical Exam Vitals signs and nursing note reviewed.  Constitutional:      General: She is not in acute distress.    Appearance: She is not diaphoretic.  HENT:     Head: Normocephalic and atraumatic.     Right Ear: External ear normal.     Left Ear: External ear normal.     Nose: Nose normal.  Eyes:     General:        Right eye: No discharge.        Left eye: No discharge.     Conjunctiva/sclera: Conjunctivae normal.     Pupils: Pupils are equal, round, and reactive to light.  Neck:     Musculoskeletal: Normal range of motion and neck supple.     Thyroid: No thyromegaly.     Vascular: No JVD.  Cardiovascular:     Rate and Rhythm: Normal rate and regular rhythm.     Heart sounds: Normal heart sounds. No murmur. No friction rub. No gallop.   Pulmonary:     Effort: Pulmonary effort is normal.     Breath sounds: Normal breath sounds.  Abdominal:     General: Bowel sounds are normal.     Palpations: Abdomen is soft. There is no mass.     Tenderness: There is no abdominal tenderness. There is no guarding.  Musculoskeletal: Normal range of motion.  Lymphadenopathy:     Cervical: No cervical adenopathy.  Skin:    General: Skin is warm and dry.     Capillary Refill: Capillary refill takes less than 2 seconds.  Neurological:     Mental Status: She is alert.     Deep Tendon Reflexes: Reflexes are normal and symmetric.     BP 102/62   Pulse 72   Temp 98.1 F (36.7 C) (Oral)   Ht 5\' 8"  (1.727 m)   Wt 184 lb (83.5 kg)   BMI 27.98 kg/m   Assessment and Plan: 1. Diarrhea of presumed infectious origin Patient has had diarrhea since the latter part of December following Septra DS for 10  days for a dental infection.  Thereafter patient developed diarrhea evaluation was positive for C. difficile toxin and antigen.  He was started on metronidazole and within 2 to 3 days the diarrhea has subsided but she continued for a 10-day course.  She did continue to have no diarrhea for 3 to 4 days until yesterday when she started having diarrhea again.  Diarrhea does describe similar to when she had the C. difficile infection.  Appointment was made with GI for presumably a recurrence, versus assistant previous infection, or viral etiology.  An appointment was made with gastroenterology for tomorrow with Ellin Mayhew at [facility].  2.  Dehydration and was instructed to continue with fluids and to avoid antimotility medications.

## 2018-12-25 ENCOUNTER — Other Ambulatory Visit
Admission: RE | Admit: 2018-12-25 | Discharge: 2018-12-25 | Disposition: A | Discharge status: Home / Self Care | Payer: Medicare Other | Source: Ambulatory Visit | Attending: Gastroenterology | Admitting: Gastroenterology

## 2018-12-25 DIAGNOSIS — R197 Diarrhea, unspecified: Secondary | ICD-10-CM | POA: Insufficient documentation

## 2018-12-25 DIAGNOSIS — Z8619 Personal history of other infectious and parasitic diseases: Secondary | ICD-10-CM | POA: Insufficient documentation

## 2018-12-25 LAB — GASTROINTESTINAL PANEL BY PCR, STOOL (REPLACES STOOL CULTURE)

## 2018-12-25 LAB — C DIFFICILE QUICK SCREEN W PCR REFLEX
C Diff antigen: POSITIVE — AB
C Diff interpretation: DETECTED
C Diff toxin: POSITIVE — AB

## 2018-12-26 ENCOUNTER — Other Ambulatory Visit: Payer: Self-pay | Admitting: Family Medicine

## 2018-12-26 DIAGNOSIS — Z1231 Encounter for screening mammogram for malignant neoplasm of breast: Secondary | ICD-10-CM

## 2019-01-01 DIAGNOSIS — H53462 Homonymous bilateral field defects, left side: Secondary | ICD-10-CM | POA: Diagnosis not present

## 2019-01-09 DIAGNOSIS — N3281 Overactive bladder: Secondary | ICD-10-CM | POA: Diagnosis not present

## 2019-01-12 ENCOUNTER — Ambulatory Visit (INDEPENDENT_AMBULATORY_CARE_PROVIDER_SITE_OTHER): Payer: Medicare Other | Admitting: Family Medicine

## 2019-01-12 ENCOUNTER — Encounter: Payer: Self-pay | Admitting: Family Medicine

## 2019-01-12 VITALS — BP 120/80 | HR 62 | Ht 68.0 in | Wt 184.0 lb

## 2019-01-12 DIAGNOSIS — Z7901 Long term (current) use of anticoagulants: Secondary | ICD-10-CM

## 2019-01-12 DIAGNOSIS — I10 Essential (primary) hypertension: Secondary | ICD-10-CM | POA: Diagnosis not present

## 2019-01-12 DIAGNOSIS — E039 Hypothyroidism, unspecified: Secondary | ICD-10-CM | POA: Diagnosis not present

## 2019-01-12 DIAGNOSIS — F3342 Major depressive disorder, recurrent, in full remission: Secondary | ICD-10-CM | POA: Diagnosis not present

## 2019-01-12 MED ORDER — WARFARIN SODIUM 3 MG PO TABS: 3.0000 mg | ORAL_TABLET | Freq: Every day | ORAL | 2 refills | Status: DC

## 2019-01-12 MED ORDER — TRAZODONE HCL 50 MG PO TABS: 50.0000 mg | ORAL_TABLET | Freq: Every day | ORAL | 1 refills | Status: DC

## 2019-01-12 MED ORDER — LEVOTHYROXINE SODIUM 75 MCG PO TABS: 75.0000 ug | ORAL_TABLET | Freq: Every day | ORAL | 1 refills | Status: DC

## 2019-01-12 MED ORDER — ESCITALOPRAM OXALATE 20 MG PO TABS: 20.0000 mg | ORAL_TABLET | Freq: Every day | ORAL | 1 refills | Status: DC

## 2019-01-12 MED ORDER — RAMIPRIL 2.5 MG PO CAPS: 2.5000 mg | ORAL_CAPSULE | Freq: Every day | ORAL | 1 refills | Status: DC

## 2019-01-12 NOTE — Progress Notes (Signed)
Date:  01/12/2019   Name:  [name] [facility]   DOB:  12/24/1950   MRN:  [id number]   Chief Complaint: Depression; Hypothyroidism; Hypertension; Insomnia (refill trazodone); and Cerebrovascular Accident (takes warfarin)  Hypertension  This is a new problem. The current episode started more than 1 year ago. The problem has been gradually worsening since onset. The problem is controlled. Associated symptoms include malaise/fatigue. Pertinent negatives include no anxiety, blurred vision, chest pain, headaches, neck pain, orthopnea, palpitations, peripheral edema, PND, shortness of breath or sweats. There are no associated agents to hypertension. Risk factors for coronary artery disease include dyslipidemia. Past treatments include ACE inhibitors. The current treatment provides moderate improvement. There is no history of angina, kidney disease, CAD/MI, CVA, heart failure, left ventricular hypertrophy, PVD or retinopathy. Identifiable causes of hypertension include a thyroid problem. There is no history of chronic renal disease, a hypertension causing med or renovascular disease.  Thyroid Problem  Presents for follow-up visit. Patient reports no anxiety, cold intolerance, constipation, depressed mood, diaphoresis, diarrhea, dry skin, fatigue, hair loss, heat intolerance, hoarse voice, leg swelling, menstrual problem, nail problem, palpitations, tremors, visual change or weight gain. (Recent illness with eventual control.) The symptoms have been stable. There is no history of heart failure.  Depression         This is a chronic problem.  The current episode started 1 to 4 weeks ago.   The onset quality is gradual.   The problem occurs every several days.  The problem has been gradually improving since onset.  Associated symptoms include no decreased concentration, no fatigue, no helplessness, no hopelessness, does not have insomnia, not irritable, no restlessness, no decreased interest, no appetite  change, no body aches, no myalgias, no headaches, no indigestion, not sad and no suicidal ideas.  Past treatments include SSRIs - Selective serotonin reuptake inhibitors.  Previous treatment provided mild relief.  Past medical history includes thyroid problem.     Pertinent negatives include no anxiety.   Review of Systems  Constitutional: Positive for malaise/fatigue. Negative for appetite change, chills, diaphoresis, fatigue, fever, unexpected weight change and weight gain.  HENT: Negative for congestion, ear discharge, ear pain, hoarse voice, rhinorrhea, sinus pressure, sneezing and sore throat.   Eyes: Negative for blurred vision, photophobia, pain, discharge, redness and itching.  Respiratory: Negative for cough, shortness of breath, wheezing and stridor.   Cardiovascular: Negative for chest pain, palpitations, orthopnea and PND.  Gastrointestinal: Negative for abdominal pain, blood in stool, constipation, diarrhea, nausea and vomiting.  Endocrine: Negative for cold intolerance, heat intolerance, polydipsia, polyphagia and polyuria.  Genitourinary: Negative for dysuria, flank pain, frequency, hematuria, menstrual problem, pelvic pain, urgency, vaginal bleeding and vaginal discharge.  Musculoskeletal: Negative for arthralgias, back pain, myalgias and neck pain.  Skin: Negative for rash.  Allergic/Immunologic: Negative for environmental allergies and food allergies.  Neurological: Negative for dizziness, tremors, weakness, light-headedness, numbness and headaches.  Hematological: Negative for adenopathy. Does not bruise/bleed easily.  Psychiatric/Behavioral: Positive for depression. Negative for decreased concentration, dysphoric mood and suicidal ideas. The patient is not nervous/anxious and does not have insomnia.     Patient Active Problem List   Diagnosis Date Noted  . Age-related osteoporosis without current pathological fracture 01/20/2018  . Osteoporosis 11/12/2017  . Urinary  incontinence 05/28/2017  . Knee pain 05/08/2017  . Muscle weakness 05/08/2017  . Sprain of MCL (medial collateral ligament) of knee 05/08/2017  . Adult hypothyroidism 10/23/2016  . Essential hypertension 10/23/2016  . Gastroesophageal reflux disease  without esophagitis 10/23/2016  . Recurrent major depressive disorder, in full remission ([place]) 10/23/2016  . Anticoagulant long-term use 10/23/2016    Allergies  Allergen Reactions  . Penicillin G Rash    Past Surgical History:  Procedure Laterality Date  . CESAREAN SECTION    . CHOLECYSTECTOMY    . COLONOSCOPY  2012   repeat in 10 yrs  . KNEE ARTHROSCOPY W/ MENISCAL REPAIR Left     Social History   Tobacco Use  . Smoking status: Never Smoker  . Smokeless tobacco: Never Used  . Tobacco comment: Smoking cessation materials not required  Substance Use Topics  . Alcohol use: Yes    Alcohol/week: 1.0 standard drinks    Types: 1 Glasses of wine per week    Comment: occasional  . Drug use: No     Medication list has been reviewed and updated.  Current Meds  Medication Sig  . escitalopram (LEXAPRO) 20 MG tablet TAKE 1 TABLET BY MOUTH EVERY DAY  . levothyroxine (SYNTHROID, LEVOTHROID) 75 MCG tablet Take 1 tablet (75 mcg total) by mouth daily.  . mupirocin ointment (BACTROBAN) 2 % Apply 1 application topically 2 (two) times daily.  . ramipril (ALTACE) 2.5 MG capsule Take 1 capsule (2.5 mg total) by mouth daily.  . traZODone (DESYREL) 50 MG tablet Take 1 tablet (50 mg total) by mouth daily.  Marland Kitchen warfarin (COUMADIN) 3 MG tablet Take 1 tablet (3 mg total) by mouth daily.    PHQ 2/9 Scores 01/12/2019 12/24/2018 12/08/2018 12/01/2018  PHQ - 2 Score 0 0 0 0  PHQ- 9 Score 0 0 0 0    Physical Exam Vitals signs and nursing note reviewed.  Constitutional:      General: She is not irritable.    Appearance: She is well-developed.  HENT:     Head: Normocephalic.     Right Ear: Tympanic membrane, ear canal and external ear normal.      Left Ear: Tympanic membrane and external ear normal.     Nose: Nose normal. No congestion or rhinorrhea.     Mouth/Throat:     Mouth: Mucous membranes are dry.     Pharynx: Oropharynx is clear. No oropharyngeal exudate or posterior oropharyngeal erythema.  Eyes:     General: Lids are everted, no foreign bodies appreciated. No scleral icterus.       Right eye: No discharge.        Left eye: No foreign body, discharge or hordeolum.     Conjunctiva/sclera: Conjunctivae normal.     Right eye: Right conjunctiva is not injected.     Left eye: Left conjunctiva is not injected.     Pupils: Pupils are equal, round, and reactive to light.  Neck:     Musculoskeletal: Normal range of motion and neck supple.     Thyroid: No thyromegaly.     Vascular: No JVD.     Trachea: No tracheal deviation.  Cardiovascular:     Rate and Rhythm: Normal rate and regular rhythm.     Heart sounds: Normal heart sounds. No murmur. No friction rub. No gallop.   Pulmonary:     Effort: Pulmonary effort is normal. No respiratory distress.     Breath sounds: Normal breath sounds. No wheezing or rales.  Abdominal:     General: Abdomen is flat. Bowel sounds are normal. There is no distension.     Palpations: Abdomen is soft. There is no mass.     Tenderness: There is no abdominal tenderness.  There is no right CVA tenderness, left CVA tenderness, guarding or rebound.     Hernia: No hernia is present.  Musculoskeletal: Normal range of motion.        General: No swelling, tenderness, deformity or signs of injury.     Right lower leg: No edema.     Left lower leg: No edema.  Lymphadenopathy:     Cervical: No cervical adenopathy.  Skin:    General: Skin is warm.     Coloration: Skin is not jaundiced or pale.     Findings: No bruising, erythema, lesion or rash.  Neurological:     Mental Status: She is alert and oriented to person, place, and time.     Cranial Nerves: No cranial nerve deficit.     Deep Tendon Reflexes:  Reflexes normal.  Psychiatric:        Mood and Affect: Mood is not anxious or depressed.     BP 120/80   Pulse 62   Ht 5\' 8"  (1.727 m)   Wt 184 lb (83.5 kg)   BMI 27.98 kg/m   Assessment and Plan: 1. Essential hypertension Controlled.  Chronic.  Continue ramipril 2.5 mg daily we will check renal function panel. - ramipril (ALTACE) 2.5 MG capsule; Take 1 capsule (2.5 mg total) by mouth daily.  Dispense: 90 capsule; Refill: 1 - Renal Function Panel  2. Adult hypothyroidism .  Controlled.  Will check TSH and adjust Synthroid accordingly - levothyroxine (SYNTHROID, LEVOTHROID) 75 MCG tablet; Take 1 tablet (75 mcg total) by mouth daily.  Dispense: 90 tablet; Refill: 1 - Thyroid Panel With TSH  3. Recurrent major depressive disorder, in full remission ([place]) Controlled.  Chronic.  We will continue S citalopram 20 mg daily with trazodone 50 mg nightly. - escitalopram (LEXAPRO) 20 MG tablet; Take 1 tablet (20 mg total) by mouth daily.  Dispense: 90 tablet; Refill: 1 - traZODone (DESYREL) 50 MG tablet; Take 1 tablet (50 mg total) by mouth daily.  Dispense: 90 tablet; Refill: 1  4. Anticoagulant long-term use She needs long-term anticoagulant because of cerebrovascular accident remotely.  Will continue Coumadin at 3 mg and will check PT/INR adjust accordingly - warfarin (COUMADIN) 3 MG tablet; Take 1 tablet (3 mg total) by mouth daily.  Dispense: 60 tablet; Refill: 2 - INR/PT

## 2019-01-13 ENCOUNTER — Ambulatory Visit
Admission: RE | Admit: 2019-01-13 | Discharge: 2019-01-13 | Disposition: A | Discharge status: Home / Self Care | Payer: Medicare Other | Source: Ambulatory Visit | Attending: Family Medicine | Admitting: Family Medicine

## 2019-01-13 DIAGNOSIS — Z1231 Encounter for screening mammogram for malignant neoplasm of breast: Secondary | ICD-10-CM | POA: Diagnosis not present

## 2019-01-13 LAB — RENAL FUNCTION PANEL
Albumin: 3.9 g/dL (ref 3.8–4.8)
BUN/Creatinine Ratio: 20 (ref 12–28)
BUN: 24 mg/dL (ref 8–27)
CO2: 21 mmol/L (ref 20–29)
Calcium: 9.9 mg/dL (ref 8.7–10.3)
Chloride: 99 mmol/L (ref 96–106)
Creatinine, Ser: 1.22 mg/dL — ABNORMAL HIGH (ref 0.57–1.00)
GFR calc Af Amer: 53 mL/min/{1.73_m2} — ABNORMAL LOW (ref >59)
GFR calc non Af Amer: 46 mL/min/{1.73_m2} — ABNORMAL LOW (ref >59)
Glucose: 78 mg/dL (ref 65–99)
Phosphorus: 4 mg/dL (ref 3.0–4.3)
Potassium: 4.8 mmol/L (ref 3.5–5.2)
SODIUM: 136 mmol/L (ref 134–144)

## 2019-01-13 LAB — THYROID PANEL WITH TSH
Free Thyroxine Index: 1.7 (ref 1.2–4.9)
T3 Uptake Ratio: 25 % (ref 24–39)
T4, Total: 6.9 ug/dL (ref 4.5–12.0)
TSH: 2.89 u[IU]/mL (ref 0.450–4.500)

## 2019-01-13 LAB — PROTIME-INR
INR: 2.6 — AB (ref 0.8–1.2)
Prothrombin Time: 25.2 s — ABNORMAL HIGH (ref 9.1–12.0)

## 2019-01-13 IMAGING — MG DIGITAL SCREENING BILATERAL MAMMOGRAM WITH TOMO AND CAD
8 series · 8 of 24 positions shown · non-contrast
Comparison: Previous exam(s).

CLINICAL DATA: Screening.

EXAM:
DIGITAL SCREENING BILATERAL MAMMOGRAM WITH TOMO AND CAD

[L MLO synth-2D]
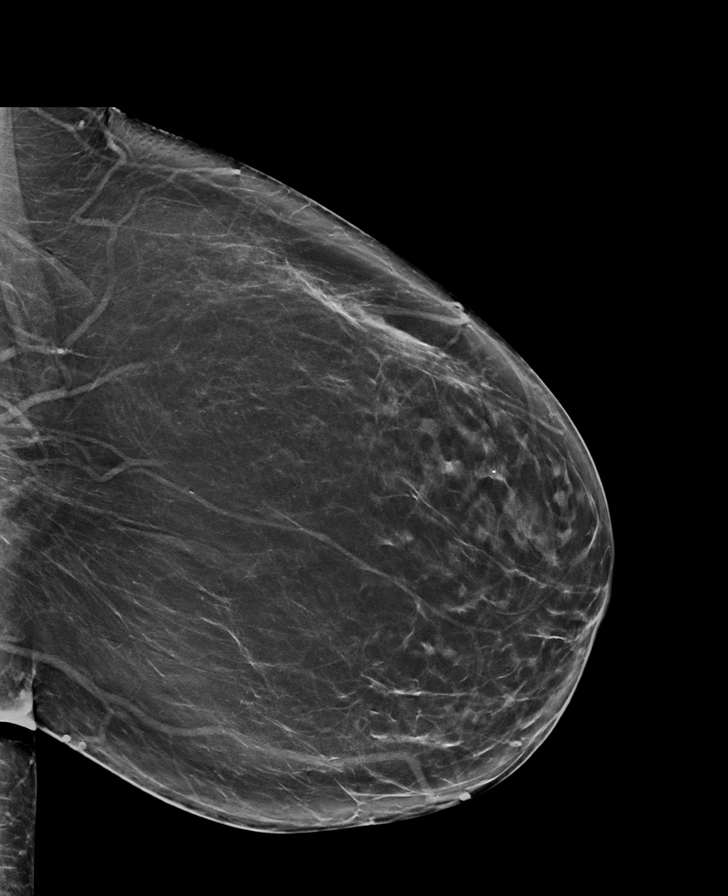

[L CC synth-2D]
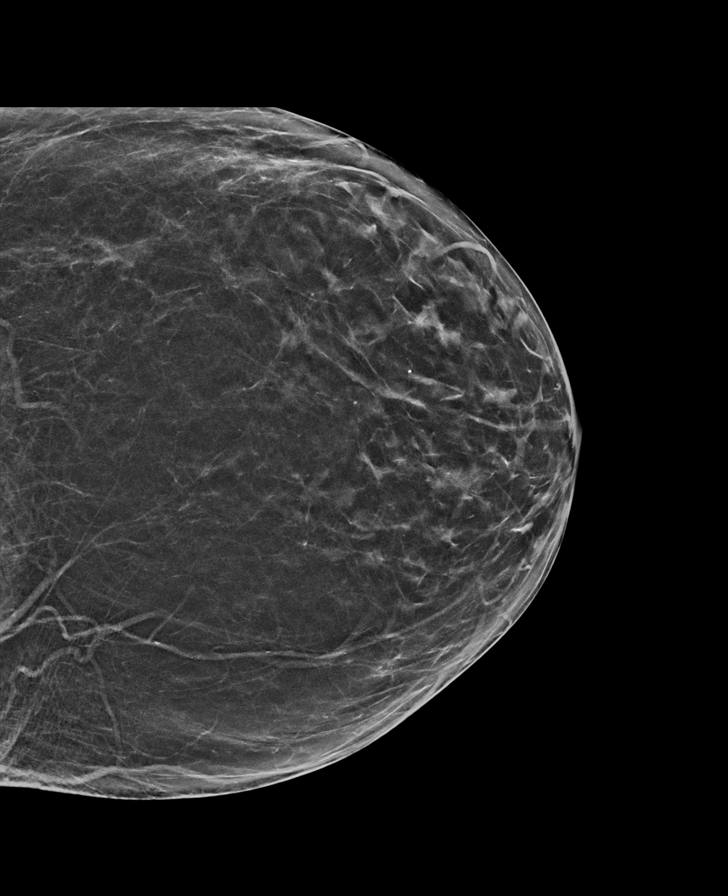

[R MLO synth-2D]
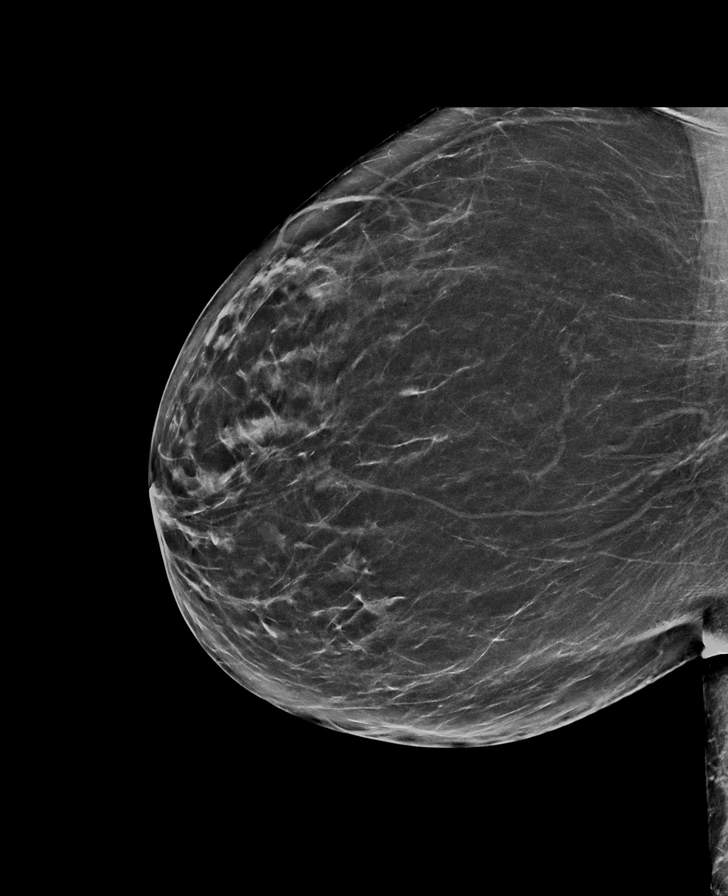

[R CC synth-2D]
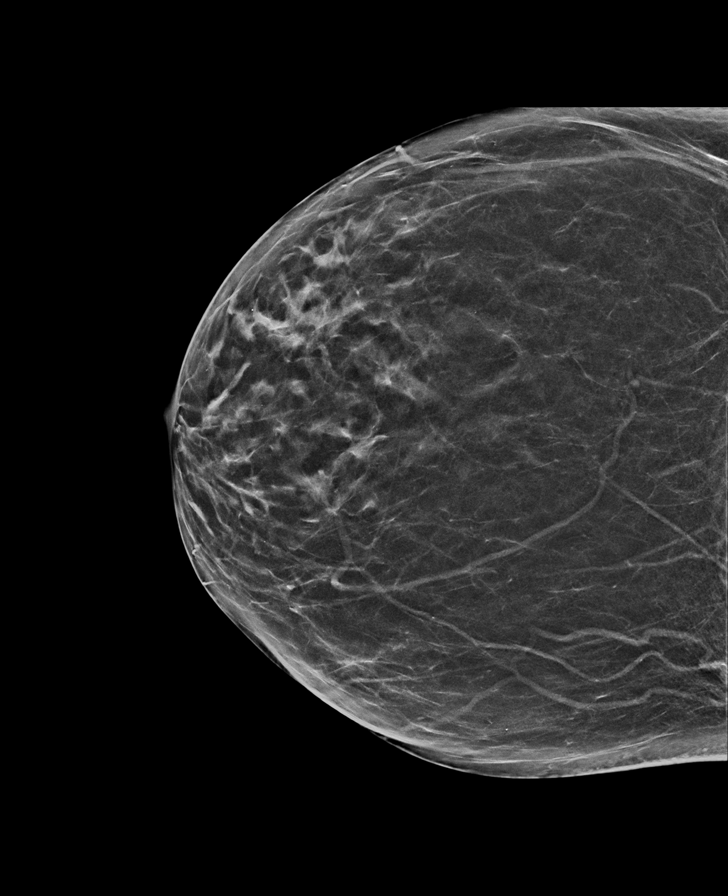

[L MLO tomo · tomo slice 37/73.0]
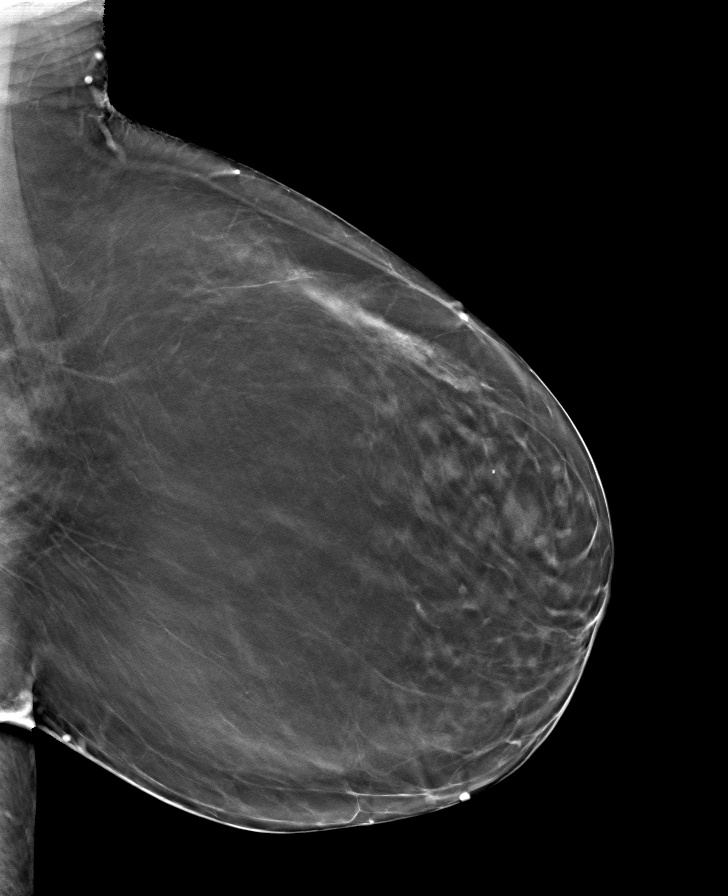

[R CC tomo · tomo slice 33/64.0]
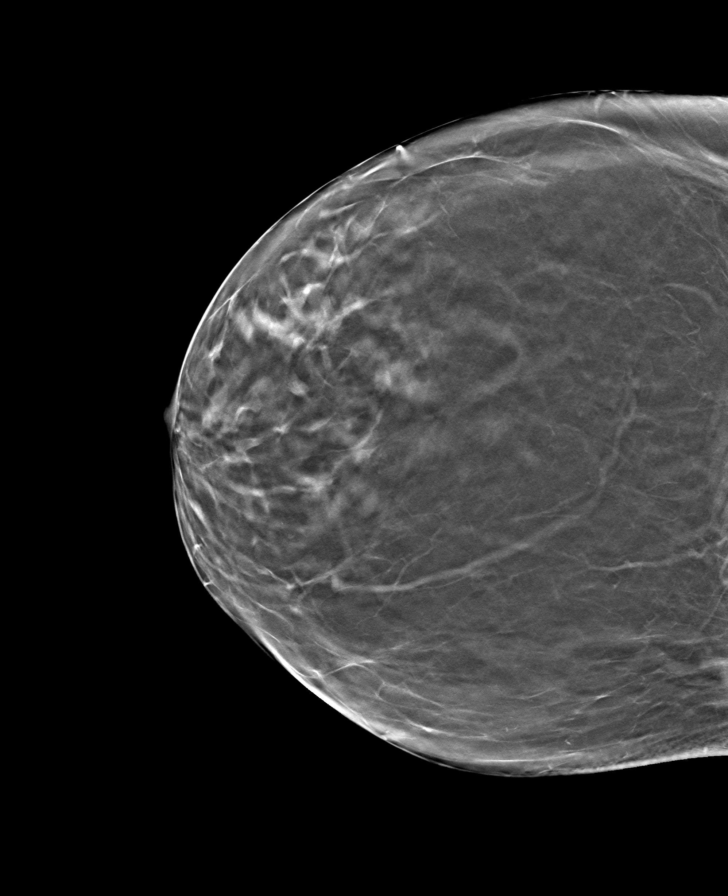

[L CC tomo · tomo slice 32/63.0]
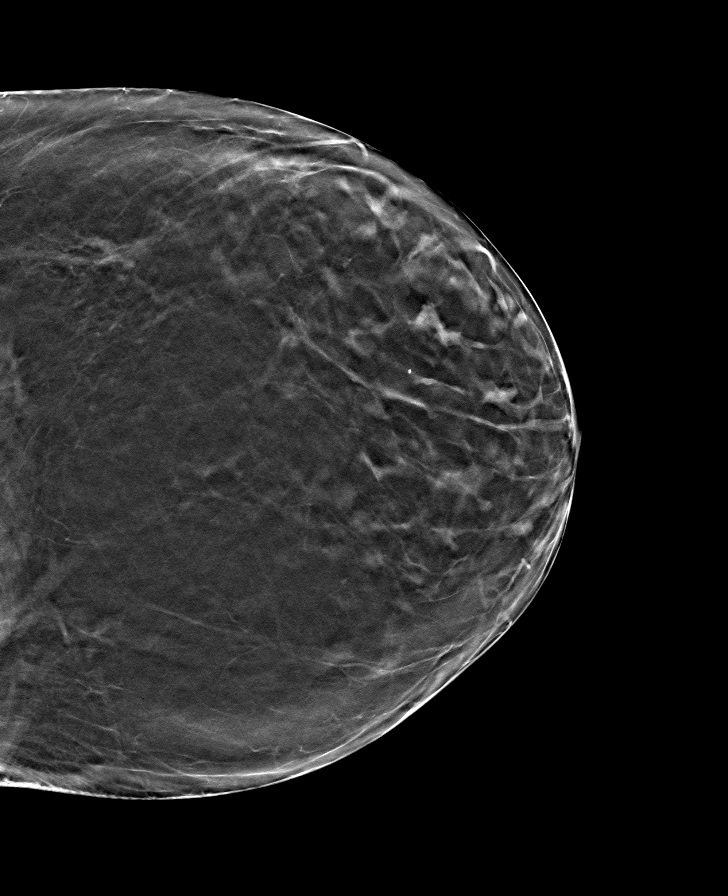

[R MLO tomo · tomo slice 36/71.0]
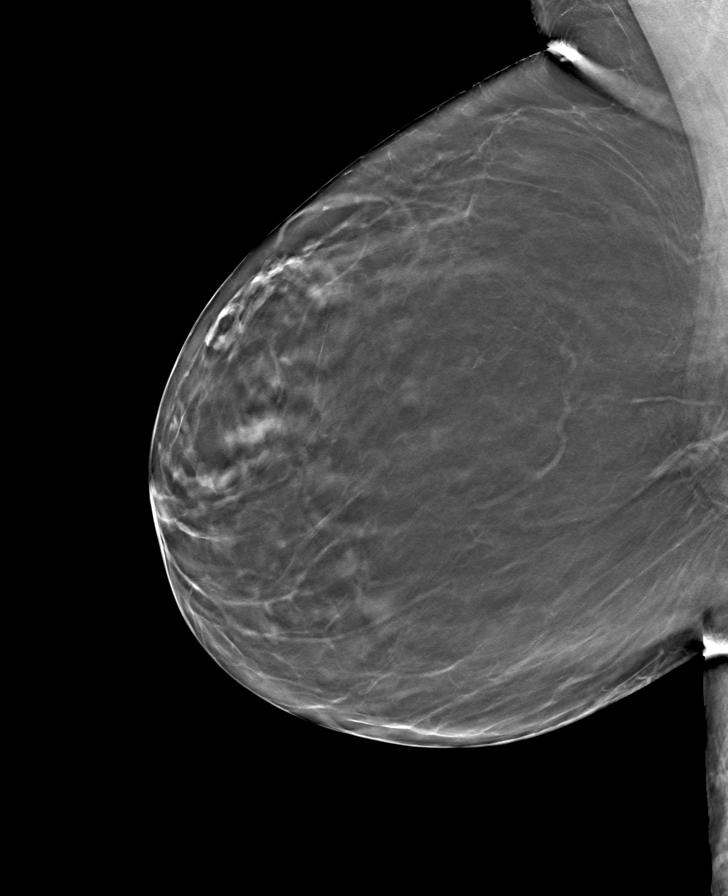

[8 of 24 positions shown; findings below may reference images not displayed]

ACR Breast Density Category b: There are scattered areas of
fibroglandular density.
FINDINGS: There are no findings suspicious for malignancy. Images were
processed with CAD.
IMPRESSION: No mammographic evidence of malignancy. A result letter of this
screening mammogram will be mailed directly to the patient.

RECOMMENDATION:
Screening mammogram in one year. (Code:[TQ])

BI-RADS CATEGORY  1: Negative.

## 2019-01-23 ENCOUNTER — Other Ambulatory Visit
Admission: RE | Admit: 2019-01-23 | Discharge: 2019-01-23 | Disposition: A | Discharge status: Home / Self Care | Payer: Medicare Other | Source: Ambulatory Visit | Attending: Gastroenterology | Admitting: Gastroenterology

## 2019-01-23 DIAGNOSIS — R197 Diarrhea, unspecified: Secondary | ICD-10-CM | POA: Insufficient documentation

## 2019-01-23 DIAGNOSIS — Z8619 Personal history of other infectious and parasitic diseases: Secondary | ICD-10-CM | POA: Diagnosis not present

## 2019-01-23 DIAGNOSIS — R5383 Other fatigue: Secondary | ICD-10-CM | POA: Diagnosis not present

## 2019-01-23 LAB — C DIFFICILE QUICK SCREEN W PCR REFLEX
C Diff antigen: POSITIVE — AB
C Diff interpretation: DETECTED
C Diff toxin: POSITIVE — AB

## 2019-01-23 LAB — GASTROINTESTINAL PANEL BY PCR, STOOL (REPLACES STOOL CULTURE)

## 2019-02-06 ENCOUNTER — Other Ambulatory Visit: Payer: Self-pay | Admitting: Family Medicine

## 2019-02-06 DIAGNOSIS — Z7901 Long term (current) use of anticoagulants: Secondary | ICD-10-CM

## 2019-02-06 DIAGNOSIS — M810 Age-related osteoporosis without current pathological fracture: Secondary | ICD-10-CM | POA: Diagnosis not present

## 2019-02-06 DIAGNOSIS — R05 Cough: Secondary | ICD-10-CM | POA: Diagnosis not present

## 2019-02-06 DIAGNOSIS — A0472 Enterocolitis due to Clostridium difficile, not specified as recurrent: Secondary | ICD-10-CM | POA: Diagnosis not present

## 2019-02-09 ENCOUNTER — Ambulatory Visit (INDEPENDENT_AMBULATORY_CARE_PROVIDER_SITE_OTHER): Payer: Medicare Other | Admitting: Family Medicine

## 2019-02-09 ENCOUNTER — Encounter: Payer: Self-pay | Admitting: Family Medicine

## 2019-02-09 VITALS — BP 120/70 | HR 80 | Temp 98.2°F | Wt 179.0 lb

## 2019-02-09 DIAGNOSIS — D6861 Antiphospholipid syndrome: Secondary | ICD-10-CM | POA: Diagnosis not present

## 2019-02-09 DIAGNOSIS — I4949 Other premature depolarization: Secondary | ICD-10-CM | POA: Diagnosis not present

## 2019-02-09 DIAGNOSIS — Z7901 Long term (current) use of anticoagulants: Secondary | ICD-10-CM | POA: Diagnosis not present

## 2019-02-09 DIAGNOSIS — R05 Cough: Secondary | ICD-10-CM | POA: Diagnosis not present

## 2019-02-09 DIAGNOSIS — W19XXXA Unspecified fall, initial encounter: Secondary | ICD-10-CM | POA: Diagnosis not present

## 2019-02-09 DIAGNOSIS — R059 Cough, unspecified: Secondary | ICD-10-CM

## 2019-02-09 DIAGNOSIS — I6789 Other cerebrovascular disease: Secondary | ICD-10-CM | POA: Diagnosis not present

## 2019-02-09 DIAGNOSIS — R5381 Other malaise: Secondary | ICD-10-CM

## 2019-02-09 DIAGNOSIS — Z79899 Other long term (current) drug therapy: Secondary | ICD-10-CM | POA: Diagnosis not present

## 2019-02-09 DIAGNOSIS — S0990XA Unspecified injury of head, initial encounter: Secondary | ICD-10-CM

## 2019-02-09 DIAGNOSIS — R55 Syncope and collapse: Secondary | ICD-10-CM | POA: Diagnosis not present

## 2019-02-09 DIAGNOSIS — G9389 Other specified disorders of brain: Secondary | ICD-10-CM | POA: Diagnosis not present

## 2019-02-09 DIAGNOSIS — E039 Hypothyroidism, unspecified: Secondary | ICD-10-CM | POA: Diagnosis not present

## 2019-02-09 DIAGNOSIS — Z880 Allergy status to penicillin: Secondary | ICD-10-CM | POA: Diagnosis not present

## 2019-02-09 DIAGNOSIS — I10 Essential (primary) hypertension: Secondary | ICD-10-CM

## 2019-02-09 DIAGNOSIS — R5383 Other fatigue: Secondary | ICD-10-CM

## 2019-02-09 DIAGNOSIS — S0003XA Contusion of scalp, initial encounter: Secondary | ICD-10-CM | POA: Diagnosis not present

## 2019-02-09 DIAGNOSIS — Z8673 Personal history of transient ischemic attack (TIA), and cerebral infarction without residual deficits: Secondary | ICD-10-CM | POA: Diagnosis not present

## 2019-02-09 DIAGNOSIS — J320 Chronic maxillary sinusitis: Secondary | ICD-10-CM | POA: Diagnosis not present

## 2019-02-09 NOTE — Progress Notes (Signed)
Date:  02/09/2019   Name:  [name] [facility]   DOB:  1951/07/12   MRN:  [id number]   Chief Complaint: Fall (fell on Saturday due to passing out in bathroom while on the way to use bathroom, hit head on toilet ( it is sore) stool was "all over the bathroom floor")  Fall  The accident occurred 2 days ago (Saturday afternoon). Fall occurred: bathroom. She landed on hard floor. There was no blood loss. The point of impact was the head (toilet). The pain is present in the head and neck. The pain is at a severity of 2/10. The pain is mild. Associated symptoms include bowel incontinence and a loss of consciousness. Pertinent negatives include no abdominal pain, fever, headaches, hearing loss, hematuria, nausea, numbness, tingling, visual change or vomiting. She has tried nothing for the symptoms.    Review of Systems  Constitutional: Positive for appetite change, fatigue and unexpected weight change. Negative for activity change, chills, diaphoresis and fever.  HENT: Negative for congestion, drooling, ear discharge, ear pain, hearing loss, postnasal drip, rhinorrhea, sinus pressure, sinus pain and sore throat.   Eyes: Negative for discharge.  Respiratory: Positive for cough. Negative for choking, chest tightness, shortness of breath, wheezing and stridor.   Cardiovascular: Negative for chest pain, palpitations and leg swelling.  Gastrointestinal: Positive for bowel incontinence. Negative for abdominal distention, abdominal pain, anal bleeding, blood in stool, constipation, diarrhea, nausea, rectal pain and vomiting.  Endocrine: Negative for cold intolerance, heat intolerance, polydipsia, polyphagia and polyuria.  Genitourinary: Negative for dysuria, frequency, hematuria, urgency, vaginal bleeding and vaginal discharge.  Musculoskeletal: Negative for back pain, myalgias and neck pain.  Skin: Negative for rash.  Allergic/Immunologic: Negative for environmental allergies.  Neurological: Positive for  seizures, loss of consciousness and syncope. Negative for dizziness, tingling, tremors, facial asymmetry, speech difficulty, weakness, light-headedness, numbness and headaches.       Uncertain/near syncopy and/or seizure  Hematological: Negative for adenopathy. Does not bruise/bleed easily.  Psychiatric/Behavioral: Negative for agitation and suicidal ideas. The patient is not nervous/anxious.     Patient Active Problem List   Diagnosis Date Noted  . Age-related osteoporosis without current pathological fracture 01/20/2018  . Osteoporosis 11/12/2017  . Urinary incontinence 05/28/2017  . Knee pain 05/08/2017  . Muscle weakness 05/08/2017  . Sprain of MCL (medial collateral ligament) of knee 05/08/2017  . Adult hypothyroidism 10/23/2016  . Essential hypertension 10/23/2016  . Gastroesophageal reflux disease without esophagitis 10/23/2016  . Recurrent major depressive disorder, in full remission ([place]) 10/23/2016  . Anticoagulant long-term use 10/23/2016    Allergies  Allergen Reactions  . Penicillin G Rash    Past Surgical History:  Procedure Laterality Date  . CESAREAN SECTION    . CHOLECYSTECTOMY    . COLONOSCOPY  2012   repeat in 10 yrs  . KNEE ARTHROSCOPY W/ MENISCAL REPAIR Left     Social History   Tobacco Use  . Smoking status: Never Smoker  . Smokeless tobacco: Never Used  . Tobacco comment: Smoking cessation materials not required  Substance Use Topics  . Alcohol use: Yes    Alcohol/week: 1.0 standard drinks    Types: 1 Glasses of wine per week    Comment: occasional  . Drug use: No     Medication list has been reviewed and updated.  Current Meds  Medication Sig  . escitalopram (LEXAPRO) 20 MG tablet Take 1 tablet (20 mg total) by mouth daily.  Marland Kitchen levothyroxine (SYNTHROID, LEVOTHROID) 75 MCG tablet  Take 1 tablet (75 mcg total) by mouth daily.  . mupirocin ointment (BACTROBAN) 2 % Apply 1 application topically 2 (two) times daily.  . ramipril (ALTACE) 2.5 MG  capsule Take 1 capsule (2.5 mg total) by mouth daily.  . traZODone (DESYREL) 50 MG tablet Take 1 tablet (50 mg total) by mouth daily.  . vancomycin (VANCOCIN) 125 MG capsule Take by mouth. Dr Croley/ GI  . warfarin (COUMADIN) 1 MG tablet TAKE 1 TABLET BY MOUTH EVERY DAY  . warfarin (COUMADIN) 3 MG tablet Take 1 tablet (3 mg total) by mouth daily.    PHQ 2/9 Scores 01/12/2019 12/24/2018 12/08/2018 12/01/2018  PHQ - 2 Score 0 0 0 0  PHQ- 9 Score 0 0 0 0    Physical Exam Vitals signs reviewed.  Constitutional:      General: She is not in acute distress.    Appearance: She is ill-appearing. She is not diaphoretic.  HENT:     Head: Normocephalic and atraumatic.     Jaw: There is normal jaw occlusion. No trismus, tenderness, swelling, pain on movement or malocclusion.     Salivary Glands: Right salivary gland is not diffusely enlarged or tender. Left salivary gland is not diffusely enlarged or tender.     Right Ear: Tympanic membrane, ear canal and external ear normal.     Left Ear: Tympanic membrane, ear canal and external ear normal.     Nose: Nose normal. No congestion or rhinorrhea.     Right Nostril: No foreign body.     Left Nostril: No foreign body.     Right Turbinates: Not enlarged.     Left Turbinates: Not enlarged.     Mouth/Throat:     Lips: Pink.  Eyes:     General: Lids are normal.        Right eye: No discharge.        Left eye: No discharge.     Conjunctiva/sclera: Conjunctivae normal.     Pupils: Pupils are equal, round, and reactive to light.  Neck:     Musculoskeletal: Full passive range of motion without pain, normal range of motion and neck supple.     Thyroid: No thyroid mass, thyromegaly or thyroid tenderness.     Vascular: No JVD.  Cardiovascular:     Rate and Rhythm: Normal rate and regular rhythm. Frequent extrasystoles are present.    Chest Wall: PMI is not displaced.     Pulses: Normal pulses.     Heart sounds: Normal heart sounds, S1 normal and S2  normal. No murmur. No systolic murmur. No diastolic murmur. No friction rub. No gallop. No S3 or S4 sounds.   Pulmonary:     Effort: Pulmonary effort is normal.     Breath sounds: Normal breath sounds. No decreased breath sounds, wheezing, rhonchi or rales.  Abdominal:     General: Abdomen is flat. Bowel sounds are normal.     Palpations: Abdomen is soft. There is no hepatomegaly, splenomegaly or mass.     Tenderness: There is no abdominal tenderness. There is no guarding.  Musculoskeletal: Normal range of motion.  Lymphadenopathy:     Head:     Right side of head: No submandibular adenopathy.     Left side of head: No submandibular adenopathy.     Cervical: No cervical adenopathy.  Skin:    General: Skin is warm and dry.     Capillary Refill: Capillary refill takes less than 2 seconds.     Coloration:  Skin is pale.  Neurological:     Mental Status: She is alert and oriented to person, place, and time.     Cranial Nerves: Cranial nerves are intact.     Sensory: Sensation is intact. No sensory deficit.     Motor: Motor function is intact. No weakness.     Deep Tendon Reflexes: Reflexes are normal and symmetric.     Reflex Scores:      Patellar reflexes are 2+ on the right side and 2+ on the left side.    BP 120/70   Pulse 80   Temp 98.2 F (36.8 C) (Oral)   Wt 179 lb (81.2 kg)   SpO2 97%   BMI 27.22 kg/m   Assessment and Plan: 1. Near syncope Uncertain exactly the patient has had a near syncopal episode and the fact that she was on the toilet and she fell off of it and then hit her head and then had fecal incontinence afterwards patient says that she never lost consciousness but has not felt well afterwards.  Patient does have a history of an antiphospholipid syndrome she is on anticoagulation therapy.  Patient did hit her head there was no laceration and no persistent headache but remains over there.  And the fact that she is on anticoagulation had has a had a history of a  stroke she was instructed to go to the emergency room for evaluation of a head injury and her history and recent fall.  2. Fall, initial encounter Patient had a fall off the toilet he does not recall if she had dizziness or lost balance but is somewhat noncommittal as to the reason why she fell.  3. Traumatic injury of head, initial encounter Patient hit her head on the toilet as she slid down but has not had any headache.  Patient is on warfarin which had normal readings which should be checked but was sent to the ER with an recheck when they do her other lab work.  She has a history of antiphospholipid syndrome  4. Anticoagulant long-term use Patient is on warfarin with normal readings and should be checked when she arrives at the ER with the other lab work  5. Antiphospholipid antibody syndrome (HCC) Patient has a history of antiphospholipid antibody syndrome for which she is not on anticoagulant coagulation elation therapy.  Patient has had a history of devious stroke this remote of several years.  6. Essential hypertension Has had a history of essential hypertension for which she was on low-dose ramipril.  Thought that her cough may be due to this and had not been taking her ramipril however her blood pressure is doing well off of the medication but she continues to have a cough which is exacerbated by supine position.  7. Malaise and fatigue Patient does not feel well at all and expressed that she felt like she was dying on initial evaluation which is not her usual state of mind.  8. Cough Patient has had a persistent cough for a week or more pulmonary exam is unremarkable pulse ox is 97% unproductive cough with no fever.  She denies any travel nor shortness of breath.  Was not really since she stopped her ramipril on Friday.

## 2019-02-10 ENCOUNTER — Other Ambulatory Visit: Payer: Self-pay

## 2019-02-10 DIAGNOSIS — R059 Cough, unspecified: Secondary | ICD-10-CM

## 2019-02-10 DIAGNOSIS — R05 Cough: Secondary | ICD-10-CM

## 2019-02-10 DIAGNOSIS — D649 Anemia, unspecified: Secondary | ICD-10-CM

## 2019-02-10 MED ORDER — BENZONATATE 100 MG PO CAPS: 100.0000 mg | ORAL_CAPSULE | Freq: Two times a day (BID) | ORAL | 0 refills | Status: DC | PRN

## 2019-02-10 MED ORDER — FERROUS SULFATE 325 (65 FE) MG PO TBEC: 325.0000 mg | DELAYED_RELEASE_TABLET | Freq: Every day | ORAL | 3 refills | Status: DC

## 2019-02-10 NOTE — Progress Notes (Unsigned)
Sent in [place] for cough and ferrous sulfate due to hemoglobin low. Recheck B/P in 3 weeks- stay off Ramipril due to cough

## 2019-02-16 ENCOUNTER — Other Ambulatory Visit: Payer: Self-pay

## 2019-02-16 DIAGNOSIS — R05 Cough: Secondary | ICD-10-CM

## 2019-02-16 DIAGNOSIS — R059 Cough, unspecified: Secondary | ICD-10-CM

## 2019-02-16 MED ORDER — MONTELUKAST SODIUM 10 MG PO TABS: 10.0000 mg | ORAL_TABLET | Freq: Every day | ORAL | 3 refills | Status: DC

## 2019-02-16 NOTE — Progress Notes (Unsigned)
Sent in [place]

## 2019-02-24 ENCOUNTER — Ambulatory Visit (INDEPENDENT_AMBULATORY_CARE_PROVIDER_SITE_OTHER): Payer: Medicare Other | Admitting: Family Medicine

## 2019-02-24 ENCOUNTER — Encounter: Payer: Self-pay | Admitting: Family Medicine

## 2019-02-24 ENCOUNTER — Other Ambulatory Visit: Payer: Self-pay

## 2019-02-24 VITALS — BP 120/80 | HR 72 | Ht 68.0 in | Wt 174.0 lb

## 2019-02-24 DIAGNOSIS — R059 Cough, unspecified: Secondary | ICD-10-CM

## 2019-02-24 DIAGNOSIS — I10 Essential (primary) hypertension: Secondary | ICD-10-CM | POA: Diagnosis not present

## 2019-02-24 DIAGNOSIS — R05 Cough: Secondary | ICD-10-CM | POA: Diagnosis not present

## 2019-02-24 DIAGNOSIS — E039 Hypothyroidism, unspecified: Secondary | ICD-10-CM | POA: Diagnosis not present

## 2019-02-24 DIAGNOSIS — Z7901 Long term (current) use of anticoagulants: Secondary | ICD-10-CM | POA: Diagnosis not present

## 2019-02-24 DIAGNOSIS — J301 Allergic rhinitis due to pollen: Secondary | ICD-10-CM

## 2019-02-24 MED ORDER — BENZONATATE 100 MG PO CAPS: 100.0000 mg | ORAL_CAPSULE | Freq: Two times a day (BID) | ORAL | 2 refills | Status: DC | PRN

## 2019-02-24 MED ORDER — LEVOTHYROXINE SODIUM 75 MCG PO TABS: 75.0000 ug | ORAL_TABLET | Freq: Every day | ORAL | 1 refills | Status: DC

## 2019-02-24 NOTE — Progress Notes (Signed)
Date:  02/24/2019   Name:  [name] [facility]   DOB:  1951-07-12   MRN:  [id number]   Chief Complaint: Hypertension (stopped ramipril- recheck B/P) and Labs Only (recheck INR)  Hypertension  This is a chronic problem. The current episode started more than 1 year ago. The problem has been gradually improving since onset. The problem is controlled. Pertinent negatives include no anxiety, blurred vision, chest pain, headaches, malaise/fatigue, neck pain, orthopnea, palpitations, peripheral edema, PND, shortness of breath or sweats. There are no associated agents to hypertension. Risk factors for coronary artery disease include post-menopausal state. Past treatments include lifestyle changes. The current treatment provides moderate improvement. There are no compliance problems.  Hypertensive end-organ damage includes CVA. There is no history of angina, kidney disease, CAD/MI, heart failure, left ventricular hypertrophy, PVD or retinopathy. There is no history of chronic renal disease, a hypertension causing med or renovascular disease.    Review of Systems  Constitutional: Negative.  Negative for chills, fatigue, fever, malaise/fatigue and unexpected weight change.  HENT: Negative for congestion, ear discharge, ear pain, rhinorrhea, sinus pressure, sneezing and sore throat.   Eyes: Negative for blurred vision, photophobia, pain, discharge, redness and itching.  Respiratory: Negative for cough, shortness of breath, wheezing and stridor.   Cardiovascular: Negative for chest pain, palpitations, orthopnea and PND.  Gastrointestinal: Negative for abdominal pain, blood in stool, constipation, diarrhea, nausea and vomiting.  Endocrine: Negative for cold intolerance, heat intolerance, polydipsia, polyphagia and polyuria.  Genitourinary: Negative for dysuria, flank pain, frequency, hematuria, menstrual problem, pelvic pain, urgency, vaginal bleeding and vaginal discharge.  Musculoskeletal: Negative for  arthralgias, back pain, myalgias and neck pain.  Skin: Negative for rash.  Allergic/Immunologic: Negative for environmental allergies and food allergies.  Neurological: Negative for dizziness, weakness, light-headedness, numbness and headaches.  Hematological: Negative for adenopathy. Does not bruise/bleed easily.  Psychiatric/Behavioral: Negative for dysphoric mood. The patient is not nervous/anxious.     Patient Active Problem List   Diagnosis Date Noted  . Age-related osteoporosis without current pathological fracture 01/20/2018  . Osteoporosis 11/12/2017  . Urinary incontinence 05/28/2017  . Knee pain 05/08/2017  . Muscle weakness 05/08/2017  . Sprain of MCL (medial collateral ligament) of knee 05/08/2017  . Adult hypothyroidism 10/23/2016  . Essential hypertension 10/23/2016  . Gastroesophageal reflux disease without esophagitis 10/23/2016  . Recurrent major depressive disorder, in full remission ([place]) 10/23/2016  . Anticoagulant long-term use 10/23/2016    Allergies  Allergen Reactions  . Penicillin G Rash    Past Surgical History:  Procedure Laterality Date  . CESAREAN SECTION    . CHOLECYSTECTOMY    . COLONOSCOPY  2012   repeat in 10 yrs  . KNEE ARTHROSCOPY W/ MENISCAL REPAIR Left     Social History   Tobacco Use  . Smoking status: Never Smoker  . Smokeless tobacco: Never Used  . Tobacco comment: Smoking cessation materials not required  Substance Use Topics  . Alcohol use: Yes    Alcohol/week: 1.0 standard drinks    Types: 1 Glasses of wine per week    Comment: occasional  . Drug use: No     Medication list has been reviewed and updated.  Current Meds  Medication Sig  . escitalopram (LEXAPRO) 20 MG tablet Take 1 tablet (20 mg total) by mouth daily.  . ferrous sulfate 325 (65 FE) MG EC tablet Take 1 tablet (325 mg total) by mouth daily with breakfast.  . levothyroxine (SYNTHROID, LEVOTHROID) 75 MCG tablet Take 1  tablet (75 mcg total) by mouth daily.   . montelukast (SINGULAIR) 10 MG tablet Take 1 tablet (10 mg total) by mouth at bedtime.  . traZODone (DESYREL) 50 MG tablet Take 1 tablet (50 mg total) by mouth daily.  . vancomycin (VANCOCIN) 125 MG capsule Take by mouth. Dr Croley/ GI  . warfarin (COUMADIN) 1 MG tablet TAKE 1 TABLET BY MOUTH EVERY DAY  . warfarin (COUMADIN) 3 MG tablet Take 1 tablet (3 mg total) by mouth daily.  . [DISCONTINUED] benzonatate (TESSALON) 100 MG capsule Take 1 capsule (100 mg total) by mouth 2 (two) times daily as needed for cough.    PHQ 2/9 Scores 01/12/2019 12/24/2018 12/08/2018 12/01/2018  PHQ - 2 Score 0 0 0 0  PHQ- 9 Score 0 0 0 0    Physical Exam Vitals signs and nursing note reviewed.  Constitutional:      General: She is not in acute distress.    Appearance: She is not diaphoretic.  HENT:     Head: Normocephalic and atraumatic.     Right Ear: Tympanic membrane, ear canal and external ear normal.     Left Ear: Tympanic membrane, ear canal and external ear normal.     Nose: Nose normal. No congestion or rhinorrhea.  Eyes:     General:        Right eye: No discharge.        Left eye: No discharge.     Conjunctiva/sclera: Conjunctivae normal.     Pupils: Pupils are equal, round, and reactive to light.  Neck:     Musculoskeletal: Normal range of motion and neck supple.     Thyroid: No thyromegaly.     Vascular: No JVD.  Cardiovascular:     Rate and Rhythm: Normal rate and regular rhythm.     Heart sounds: Normal heart sounds. No murmur. No friction rub. No gallop.   Pulmonary:     Effort: Pulmonary effort is normal.     Breath sounds: Normal breath sounds. No wheezing, rhonchi or rales.  Chest:     Chest wall: No tenderness.  Abdominal:     General: Bowel sounds are normal.     Palpations: Abdomen is soft. There is no mass.     Tenderness: There is no abdominal tenderness. There is no guarding.  Musculoskeletal: Normal range of motion.  Lymphadenopathy:     Cervical: No cervical  adenopathy.  Skin:    General: Skin is warm and dry.  Neurological:     Mental Status: She is alert.     Deep Tendon Reflexes: Reflexes are normal and symmetric.     Wt Readings from Last 3 Encounters:  02/24/19 174 lb (78.9 kg)  02/09/19 179 lb (81.2 kg)  01/12/19 184 lb (83.5 kg)    BP 120/80   Pulse 72   Ht 5\' 8"  (1.727 m)   Wt 174 lb (78.9 kg)   BMI 26.46 kg/m   Assessment and Plan: 1. Essential hypertension Chronic.  Controlled.  Patient is now only controlled with diet and watching sodium intake.  Ramipril was discontinued and cough improved.  Hold antihypertensive meds at this time and only control with sodium restriction.  2. Anticoagulant long-term use Patient has a history of antiphospholipid deficiency.  Check PT/INR while on Coumadin therapy. - INR/PT  3. Adult hypothyroidism With history of hypothyroid.  Reviewed last TSH and panel which is in normal limits.  Will check in 6 months and will maintain levothyroxine at current dosing. -  levothyroxine (SYNTHROID, LEVOTHROID) 75 MCG tablet; Take 1 tablet (75 mcg total) by mouth daily.  Dispense: 90 tablet; Refill: 1  4. Cough Patient has a cough that has improved since we have discontinued the ramipril.  We will continue the [facility] as needed for cough and have reminded patient that there is likely a pollen [place] to the symptom at this point in time as well. - benzonatate (TESSALON) 100 MG capsule; Take 1 capsule (100 mg total) by mouth 2 (two) times daily as needed for cough.  Dispense: 30 capsule; Refill: 2  5. Seasonal allergic rhinitis due to pollen Pollen count is high in the area and is likely contributing to upper respiratory congestion and postnasal drainage causing a cough will continue Tessalon Perles at this time.

## 2019-02-25 ENCOUNTER — Other Ambulatory Visit: Payer: Self-pay

## 2019-02-25 DIAGNOSIS — Z7901 Long term (current) use of anticoagulants: Secondary | ICD-10-CM

## 2019-02-25 LAB — PROTIME-INR
INR: 6.6 (ref 0.8–1.2)
PROTHROMBIN TIME: 57.7 s — AB (ref 9.1–12.0)

## 2019-02-25 MED ORDER — WARFARIN SODIUM 1 MG PO TABS: 1.0000 mg | ORAL_TABLET | Freq: Every day | ORAL | 2 refills | Status: DC

## 2019-03-03 ENCOUNTER — Ambulatory Visit: Payer: Self-pay | Admitting: Family Medicine

## 2019-03-06 ENCOUNTER — Other Ambulatory Visit: Payer: Self-pay

## 2019-03-06 DIAGNOSIS — R059 Cough, unspecified: Secondary | ICD-10-CM

## 2019-03-06 DIAGNOSIS — R05 Cough: Secondary | ICD-10-CM

## 2019-03-06 MED ORDER — ALBUTEROL SULFATE HFA 108 (90 BASE) MCG/ACT IN AERS: 2.0000 | INHALATION_SPRAY | Freq: Four times a day (QID) | RESPIRATORY_TRACT | 0 refills | Status: DC | PRN

## 2019-03-06 NOTE — Progress Notes (Unsigned)
Pt's daughter called in stating that "mom doesn't know if she can take tessalon perles with singulair." I returned the call and verified that she is free of symptoms such as fever, SOB chest pressure and cyanosis. We offered to see pt in office and order chest xray. Pt opted not to do this. Therefore, we sent in Albuterol inhaler with spacer to [facility] [place].

## 2019-03-07 ENCOUNTER — Other Ambulatory Visit: Payer: Self-pay | Admitting: Family Medicine

## 2019-03-07 DIAGNOSIS — R059 Cough, unspecified: Secondary | ICD-10-CM

## 2019-03-07 DIAGNOSIS — R05 Cough: Secondary | ICD-10-CM

## 2019-03-11 ENCOUNTER — Ambulatory Visit: Payer: Medicare Other

## 2019-03-11 DIAGNOSIS — Z7901 Long term (current) use of anticoagulants: Secondary | ICD-10-CM

## 2019-03-11 NOTE — Progress Notes (Signed)
Labs printed.

## 2019-03-12 LAB — PROTIME-INR
INR: 1.8 — ABNORMAL HIGH (ref 0.8–1.2)
Prothrombin Time: 18.4 s — ABNORMAL HIGH (ref 9.1–12.0)

## 2019-03-17 ENCOUNTER — Other Ambulatory Visit: Payer: Self-pay

## 2019-03-17 ENCOUNTER — Ambulatory Visit (INDEPENDENT_AMBULATORY_CARE_PROVIDER_SITE_OTHER): Payer: Medicare Other | Admitting: Family Medicine

## 2019-03-17 ENCOUNTER — Ambulatory Visit
Admission: RE | Admit: 2019-03-17 | Discharge: 2019-03-17 | Disposition: A | Discharge status: Home / Self Care | Payer: Medicare Other | Attending: Family Medicine | Admitting: Family Medicine

## 2019-03-17 ENCOUNTER — Encounter: Payer: Self-pay | Admitting: Family Medicine

## 2019-03-17 ENCOUNTER — Ambulatory Visit
Admission: RE | Admit: 2019-03-17 | Discharge: 2019-03-17 | Disposition: A | Discharge status: Home / Self Care | Payer: Medicare Other | Source: Ambulatory Visit | Attending: Family Medicine | Admitting: Family Medicine

## 2019-03-17 VITALS — BP 120/60 | HR 68 | Ht 68.0 in | Wt 175.0 lb

## 2019-03-17 DIAGNOSIS — R059 Cough, unspecified: Secondary | ICD-10-CM

## 2019-03-17 DIAGNOSIS — R05 Cough: Secondary | ICD-10-CM | POA: Diagnosis not present

## 2019-03-17 DIAGNOSIS — N289 Disorder of kidney and ureter, unspecified: Secondary | ICD-10-CM | POA: Diagnosis not present

## 2019-03-17 DIAGNOSIS — D649 Anemia, unspecified: Secondary | ICD-10-CM

## 2019-03-17 DIAGNOSIS — R5383 Other fatigue: Secondary | ICD-10-CM

## 2019-03-17 IMAGING — CR CHEST - 2 VIEW
2 series · 2 of 2 positions shown · non-contrast
Comparison: None.

CLINICAL DATA: 67-year-old female with chronic dry cough for the
past 10 years which has worsened over the past several weeks.

EXAM:
CHEST - 2 VIEW

[chest pa]
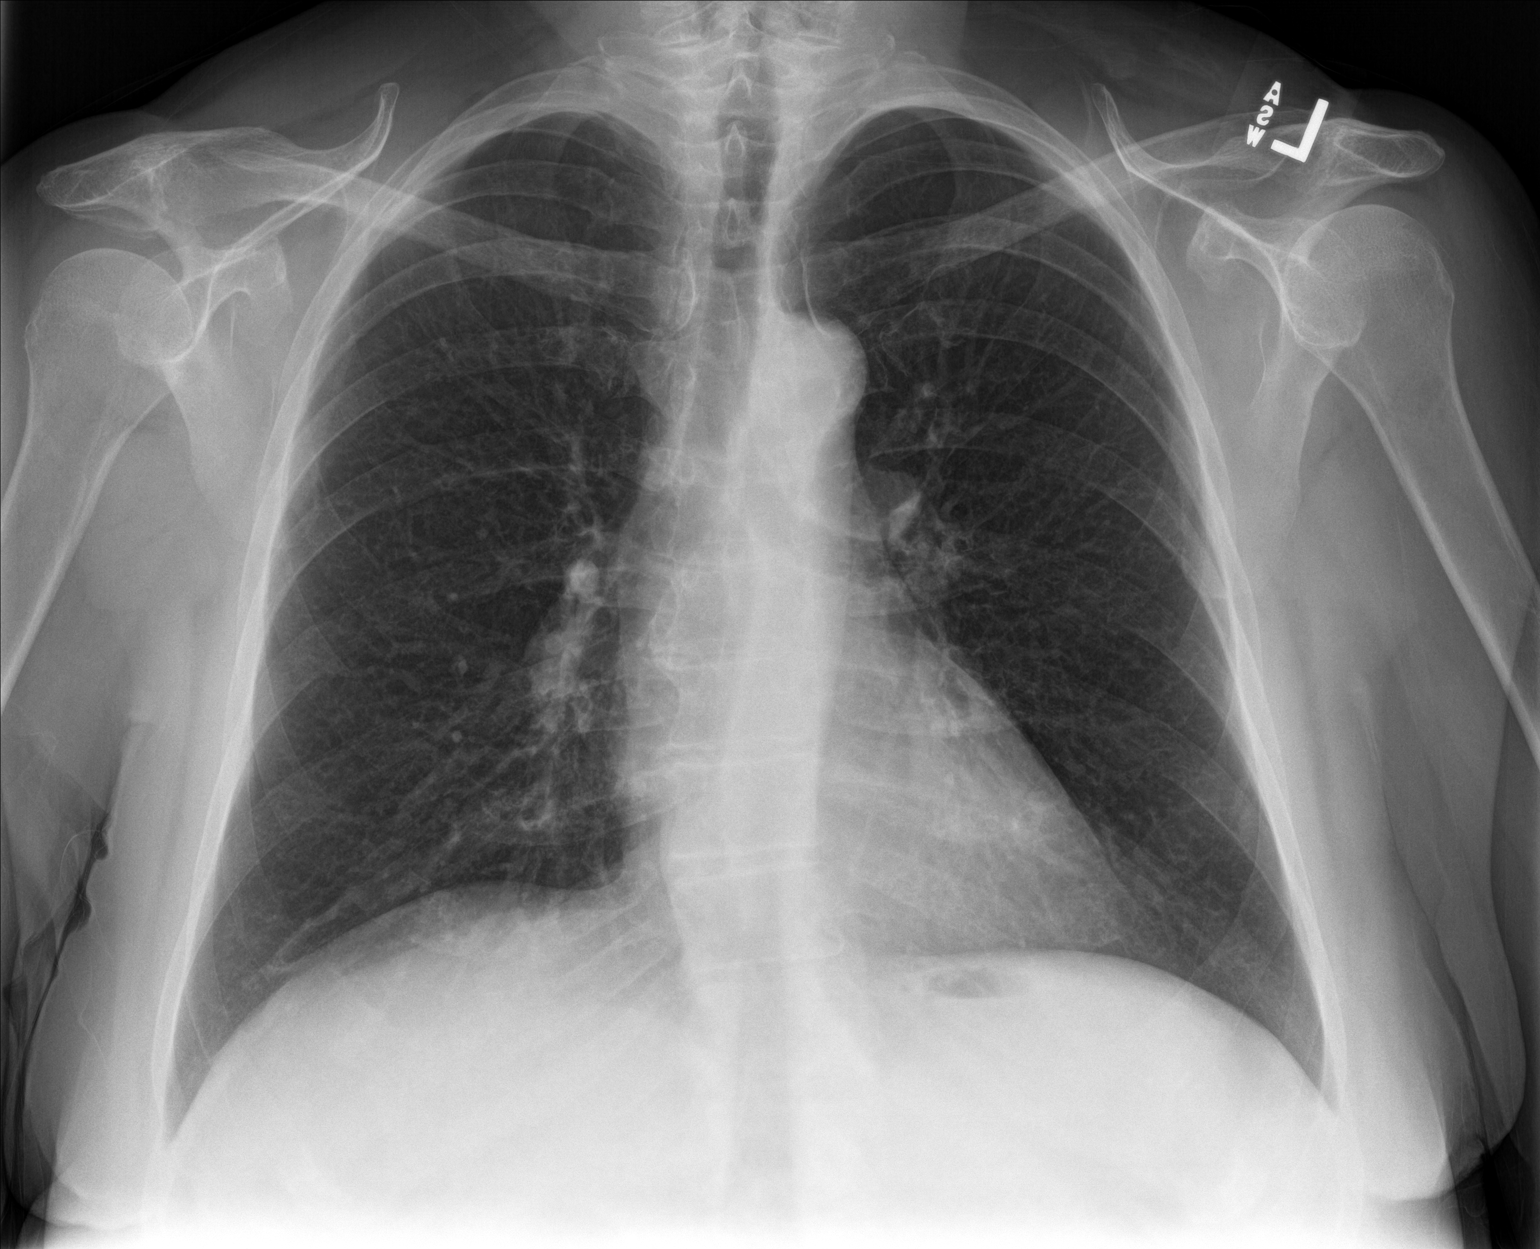

[chest lat]
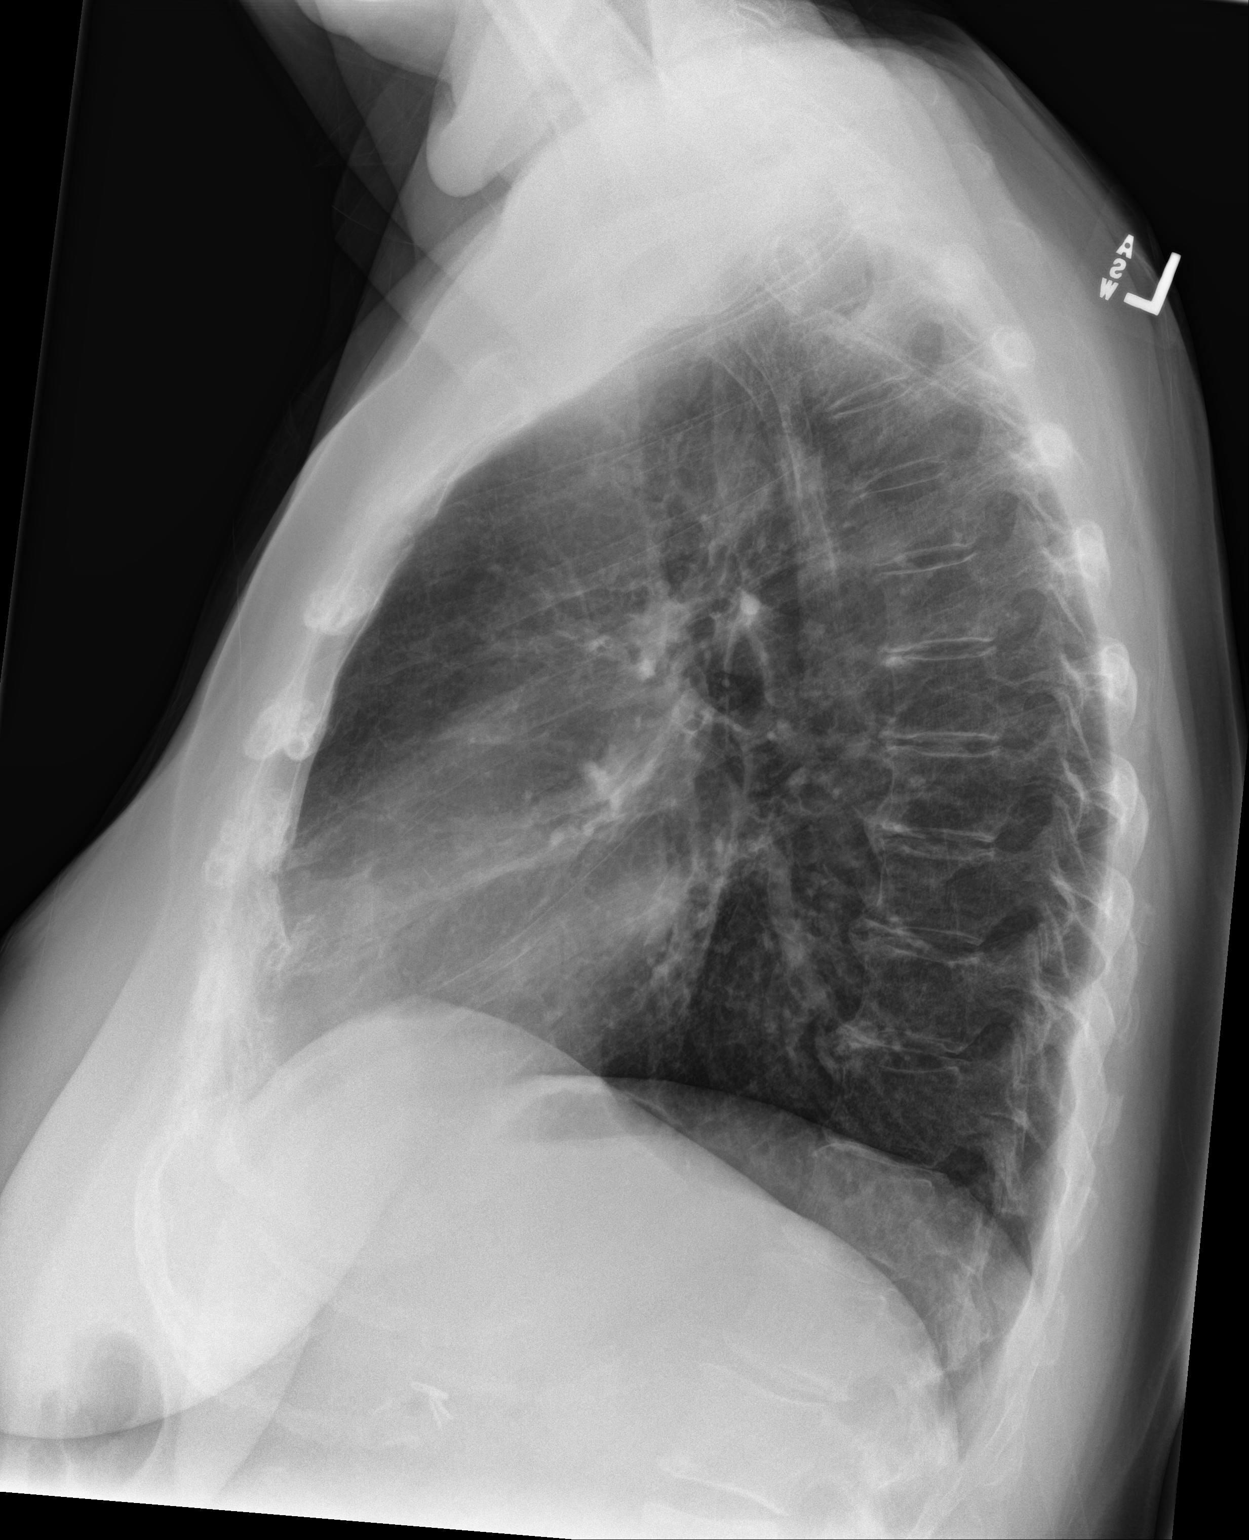

[2 of 2 positions shown; findings below may reference images not displayed]

FINDINGS: The lungs are clear and negative for focal airspace consolidation,
pulmonary edema or suspicious pulmonary nodule. No pleural effusion
or pneumothorax. Cardiac and mediastinal contours are within normal
limits. No acute fracture or lytic or blastic osseous lesions. The
visualized upper abdominal bowel gas pattern is unremarkable.
Surgical clips in the right upper quadrant suggest prior
cholecystectomy.
IMPRESSION: No active cardiopulmonary disease.

## 2019-03-17 NOTE — Progress Notes (Signed)
Date:  03/17/2019   Name:  [name] [facility]   DOB:  11-Mar-1951   MRN:  [id number]   Chief Complaint: No chief complaint on file.  Cough  This is a new problem. The current episode started more than 1 year ago (seen Dr Kathyrn Sheriff). The problem has been gradually worsening. The cough is non-productive. Associated symptoms include postnasal drip. Pertinent negatives include no chest pain, chills, ear congestion, ear pain, eye redness, fever, headaches, heartburn, hemoptysis, myalgias, nasal congestion, rash, rhinorrhea, sore throat, shortness of breath, sweats, weight loss or wheezing. The symptoms are aggravated by lying down. She has tried a beta-agonist inhaler for the symptoms. The treatment provided moderate relief. There is no history of asthma, bronchiectasis, bronchitis, COPD, emphysema, environmental allergies or pneumonia.  Thyroid Problem  Presents for initial visit. Patient reports no anxiety, cold intolerance, constipation, depressed mood, diaphoresis, diarrhea, dry skin, fatigue, hair loss, heat intolerance, hoarse voice, leg swelling, menstrual problem, nail problem, palpitations, tremors, visual change, weight gain or weight loss. The symptoms have been stable. Past treatments include nothing. There is no history of atrial fibrillation, dementia, diabetes, Graves' ophthalmopathy, heart failure, hyperlipidemia, neuropathy, obesity or osteopenia.    Review of Systems  Constitutional: Negative.  Negative for chills, diaphoresis, fatigue, fever, unexpected weight change, weight gain and weight loss.  HENT: Positive for postnasal drip. Negative for congestion, ear discharge, ear pain, hoarse voice, rhinorrhea, sinus pressure, sneezing and sore throat.   Eyes: Negative for photophobia, pain, discharge, redness and itching.  Respiratory: Positive for cough. Negative for hemoptysis, shortness of breath, wheezing and stridor.   Cardiovascular: Negative for chest pain and palpitations.   Gastrointestinal: Negative for abdominal pain, blood in stool, constipation, diarrhea, heartburn, nausea and vomiting.  Endocrine: Negative for cold intolerance, heat intolerance, polydipsia, polyphagia and polyuria.  Genitourinary: Negative for dysuria, flank pain, frequency, hematuria, menstrual problem, pelvic pain, urgency, vaginal bleeding and vaginal discharge.  Musculoskeletal: Negative for arthralgias, back pain and myalgias.  Skin: Negative for rash.  Allergic/Immunologic: Negative for environmental allergies and food allergies.  Neurological: Negative for dizziness, tremors, weakness, light-headedness, numbness and headaches.  Hematological: Negative for adenopathy. Does not bruise/bleed easily.  Psychiatric/Behavioral: Negative for dysphoric mood. The patient is not nervous/anxious.     Patient Active Problem List   Diagnosis Date Noted  . Seasonal allergic rhinitis due to pollen 02/24/2019  . Age-related osteoporosis without current pathological fracture 01/20/2018  . Osteoporosis 11/12/2017  . Urinary incontinence 05/28/2017  . Knee pain 05/08/2017  . Muscle weakness 05/08/2017  . Sprain of MCL (medial collateral ligament) of knee 05/08/2017  . Adult hypothyroidism 10/23/2016  . Essential hypertension 10/23/2016  . Gastroesophageal reflux disease without esophagitis 10/23/2016  . Recurrent major depressive disorder, in full remission ([place]) 10/23/2016  . Anticoagulant long-term use 10/23/2016    Allergies  Allergen Reactions  . Penicillin G Rash    Past Surgical History:  Procedure Laterality Date  . CESAREAN SECTION    . CHOLECYSTECTOMY    . COLONOSCOPY  2012   repeat in 10 yrs  . KNEE ARTHROSCOPY W/ MENISCAL REPAIR Left     Social History   Tobacco Use  . Smoking status: Never Smoker  . Smokeless tobacco: Never Used  . Tobacco comment: Smoking cessation materials not required  Substance Use Topics  . Alcohol use: Yes    Alcohol/week: 1.0 standard drinks     Types: 1 Glasses of wine per week    Comment: occasional  . Drug use: No  Medication list has been reviewed and updated.  No outpatient medications have been marked as taking for the 03/17/19 encounter (Appointment) with Juline Patch, MD.    [facility] 2/9 Scores 01/12/2019 12/24/2018 12/08/2018 12/01/2018  PHQ - 2 Score 0 0 0 0  PHQ- 9 Score 0 0 0 0    BP Readings from Last 3 Encounters:  02/24/19 120/80  02/09/19 120/70  01/12/19 120/80    Physical Exam Vitals signs reviewed.  Constitutional:      Appearance: Normal appearance. She is well-developed.  HENT:     Head: Normocephalic. No masses.     Jaw: There is normal jaw occlusion.     Right Ear: Tympanic membrane and external ear normal.     Left Ear: Tympanic membrane and external ear normal.     Mouth/Throat:     Lips: Pink.     Mouth: Mucous membranes are moist.  Eyes:     General: Lids are everted, no foreign bodies appreciated. No scleral icterus.       Left eye: No foreign body or hordeolum.     Conjunctiva/sclera: Conjunctivae normal.     Right eye: Right conjunctiva is not injected.     Left eye: Left conjunctiva is not injected.     Pupils: Pupils are equal, round, and reactive to light.  Neck:     Musculoskeletal: Normal range of motion and neck supple.     Thyroid: No thyromegaly.     Vascular: No JVD.     Trachea: No tracheal deviation.  Cardiovascular:     Rate and Rhythm: Normal rate and regular rhythm.     Heart sounds: Normal heart sounds. No murmur. No friction rub. No gallop.   Pulmonary:     Effort: Pulmonary effort is normal. No respiratory distress.     Breath sounds: Normal breath sounds. No wheezing or rales.  Abdominal:     General: Bowel sounds are normal.     Palpations: Abdomen is soft. There is no mass.     Tenderness: There is no abdominal tenderness. There is no guarding or rebound.  Musculoskeletal: Normal range of motion.        General: No tenderness.  Lymphadenopathy:      Cervical: No cervical adenopathy.  Skin:    General: Skin is warm.     Capillary Refill: Capillary refill takes less than 2 seconds.     Coloration: Skin is pale.     Findings: No rash.  Neurological:     Mental Status: She is alert and oriented to person, place, and time.     Cranial Nerves: No cranial nerve deficit.     Deep Tendon Reflexes: Reflexes normal.  Psychiatric:        Mood and Affect: Mood is not anxious or depressed.     Wt Readings from Last 3 Encounters:  02/24/19 174 lb (78.9 kg)  02/09/19 179 lb (81.2 kg)  01/12/19 184 lb (83.5 kg)    There were no vitals taken for this visit.  Assessment and Plan: 1. Cough Review of GI note concerning acute on chronic cough determinants that cough was thought to be either secondary to the ACE inhibitor of which is recently been discontinued but cough is been continued or antibiotic use.  Cough seems to be positional may be secondary to reflux.  This would be also staying treated by history of being evaluated by Dr. Jiles Prows for which patient was informed that it was a likely reflux issue.  Will obtain a chest x-ray at this time to evaluate for cardiomegaly or pulmonary concern. - DG Chest 2 View; Future  2. Anemia, unspecified type Patient has had a gradual onset of anemia for which initially there was some suggestion of macrocytosis which is become more of a normocytic anemia.  This might be consistent with anemia of chronic disease or CKD.  Patient has had a recent increase of creatinine and decreasing GFR to a level of stage III.  Will repeat renal function because the other panels were done under relative dehydration due to persistent diarrhea of Campylobacter difficile which is now resolved.  Will repeat CBC with indices to determine status of her anemia.  In the meantime we will look at serum ferritin, B12, and creatinine and GFR - CBC with Differential/Platelet - Ferritin  3. Fatigue, unspecified type And has undergone  insidious onset of fatigue over the past several weeks which may be secondary to anemia or renal insufficiency or sleep deprivation from nocturnal cough.  Will evaluate with chest x-ray and lab work.  Review of lab work notes that the thyroid function is normal however sed rate was elevated by another physicians evaluation.  This may be causing some issues with her renal function.  Will check renal function panel if continued to deteriorate we will be making referral to nephrology. - CBC with Differential/Platelet  4. Renal insufficiency Gradual decline in GFR with concurrent elevation of creatinine.  We will recheck renal function panel and if GFR remains an issue will refer to nephrology for evaluation.   - Renal Function Panel - Ambulatory referral to Nephrology

## 2019-03-18 LAB — B12 AND FOLATE PANEL
Folate: >20 ng/mL (ref >3.0)
Vitamin B-12: 674 pg/mL (ref 232–1245)

## 2019-03-18 LAB — CBC WITH DIFFERENTIAL/PLATELET
Basophils Absolute: 0.1 10*3/uL (ref 0.0–0.2)
Basos: 1 %
EOS (ABSOLUTE): 0.1 10*3/uL (ref 0.0–0.4)
Eos: 2 %
Hematocrit: 29.3 % — ABNORMAL LOW (ref 34.0–46.6)
Hemoglobin: 9.4 g/dL — ABNORMAL LOW (ref 11.1–15.9)
Immature Grans (Abs): 0 10*3/uL (ref 0.0–0.1)
Immature Granulocytes: 1 %
Lymphocytes Absolute: 1.7 10*3/uL (ref 0.7–3.1)
Lymphs: 23 %
MCH: 29.8 pg (ref 26.6–33.0)
MCHC: 32.1 g/dL (ref 31.5–35.7)
MCV: 93 fL (ref 79–97)
Monocytes Absolute: 0.5 10*3/uL (ref 0.1–0.9)
Monocytes: 7 %
Neutrophils Absolute: 5.1 10*3/uL (ref 1.4–7.0)
Neutrophils: 66 %
Platelets: 336 10*3/uL (ref 150–450)
RBC: 3.15 x10E6/uL — ABNORMAL LOW (ref 3.77–5.28)
RDW: 13.5 % (ref 11.7–15.4)
WBC: 7.5 10*3/uL (ref 3.4–10.8)

## 2019-03-18 LAB — RENAL FUNCTION PANEL
Albumin: 3.4 g/dL — ABNORMAL LOW (ref 3.8–4.8)
BUN/Creatinine Ratio: 13 (ref 12–28)
BUN: 20 mg/dL (ref 8–27)
CO2: 23 mmol/L (ref 20–29)
Calcium: 9.4 mg/dL (ref 8.7–10.3)
Chloride: 105 mmol/L (ref 96–106)
Creatinine, Ser: 1.6 mg/dL — ABNORMAL HIGH (ref 0.57–1.00)
GFR calc Af Amer: 38 mL/min/{1.73_m2} — ABNORMAL LOW (ref >59)
GFR calc non Af Amer: 33 mL/min/{1.73_m2} — ABNORMAL LOW (ref >59)
Glucose: 107 mg/dL — ABNORMAL HIGH (ref 65–99)
Phosphorus: 3.6 mg/dL (ref 3.0–4.3)
Potassium: 3.6 mmol/L (ref 3.5–5.2)
Sodium: 143 mmol/L (ref 134–144)

## 2019-03-18 LAB — FERRITIN: Ferritin: 379 ng/mL — ABNORMAL HIGH (ref 15–150)

## 2019-03-19 ENCOUNTER — Other Ambulatory Visit: Payer: Self-pay | Admitting: Nephrology

## 2019-03-19 DIAGNOSIS — N179 Acute kidney failure, unspecified: Secondary | ICD-10-CM | POA: Diagnosis not present

## 2019-03-19 DIAGNOSIS — N183 Chronic kidney disease, stage 3 (moderate): Secondary | ICD-10-CM | POA: Diagnosis not present

## 2019-03-19 DIAGNOSIS — I129 Hypertensive chronic kidney disease with stage 1 through stage 4 chronic kidney disease, or unspecified chronic kidney disease: Secondary | ICD-10-CM | POA: Diagnosis not present

## 2019-03-20 ENCOUNTER — Other Ambulatory Visit: Payer: Medicare Other

## 2019-03-20 ENCOUNTER — Other Ambulatory Visit: Payer: Self-pay

## 2019-03-21 ENCOUNTER — Encounter: Payer: Self-pay | Admitting: Family Medicine

## 2019-03-23 DIAGNOSIS — N390 Urinary tract infection, site not specified: Secondary | ICD-10-CM | POA: Diagnosis not present

## 2019-03-23 DIAGNOSIS — N179 Acute kidney failure, unspecified: Secondary | ICD-10-CM | POA: Diagnosis not present

## 2019-03-23 DIAGNOSIS — N183 Chronic kidney disease, stage 3 (moderate): Secondary | ICD-10-CM | POA: Diagnosis not present

## 2019-03-24 ENCOUNTER — Other Ambulatory Visit: Payer: Self-pay

## 2019-03-24 DIAGNOSIS — Z7901 Long term (current) use of anticoagulants: Secondary | ICD-10-CM

## 2019-03-24 MED ORDER — WARFARIN SODIUM 1 MG PO TABS: 1.0000 mg | ORAL_TABLET | Freq: Every day | ORAL | 2 refills | Status: DC

## 2019-03-25 ENCOUNTER — Other Ambulatory Visit: Payer: Self-pay | Admitting: Nephrology

## 2019-03-25 DIAGNOSIS — N183 Chronic kidney disease, stage 3 unspecified: Secondary | ICD-10-CM

## 2019-04-03 DIAGNOSIS — K219 Gastro-esophageal reflux disease without esophagitis: Secondary | ICD-10-CM

## 2019-04-03 HISTORY — DX: Gastro-esophageal reflux disease without esophagitis: K21.9

## 2019-04-10 ENCOUNTER — Other Ambulatory Visit: Payer: Medicare Other

## 2019-04-10 ENCOUNTER — Other Ambulatory Visit: Payer: Self-pay

## 2019-04-10 DIAGNOSIS — Z7901 Long term (current) use of anticoagulants: Secondary | ICD-10-CM | POA: Diagnosis not present

## 2019-04-11 LAB — PROTIME-INR
INR: 1.3 — ABNORMAL HIGH (ref 0.8–1.2)
Prothrombin Time: 13.5 s — ABNORMAL HIGH (ref 9.1–12.0)

## 2019-04-17 ENCOUNTER — Other Ambulatory Visit: Payer: Self-pay | Admitting: Family Medicine

## 2019-04-17 DIAGNOSIS — F3342 Major depressive disorder, recurrent, in full remission: Secondary | ICD-10-CM

## 2019-05-05 ENCOUNTER — Ambulatory Visit: Payer: Medicare Other

## 2019-05-05 ENCOUNTER — Other Ambulatory Visit: Payer: Self-pay

## 2019-05-05 ENCOUNTER — Ambulatory Visit
Admission: RE | Admit: 2019-05-05 | Discharge: 2019-05-05 | Disposition: A | Discharge status: Home / Self Care | Payer: Medicare Other | Source: Ambulatory Visit | Attending: Nephrology | Admitting: Nephrology

## 2019-05-05 DIAGNOSIS — N183 Chronic kidney disease, stage 3 unspecified: Secondary | ICD-10-CM

## 2019-05-05 DIAGNOSIS — Z7901 Long term (current) use of anticoagulants: Secondary | ICD-10-CM

## 2019-05-06 LAB — PROTIME-INR
INR: 1.7 — ABNORMAL HIGH (ref 0.8–1.2)
Prothrombin Time: 17.7 s — ABNORMAL HIGH (ref 9.1–12.0)

## 2019-05-20 ENCOUNTER — Other Ambulatory Visit: Payer: Medicare Other

## 2019-05-20 ENCOUNTER — Other Ambulatory Visit: Payer: Self-pay

## 2019-05-20 DIAGNOSIS — Z7901 Long term (current) use of anticoagulants: Secondary | ICD-10-CM

## 2019-05-21 LAB — PROTIME-INR
INR: 1.9 — ABNORMAL HIGH (ref 0.8–1.2)
Prothrombin Time: 19.8 s — ABNORMAL HIGH (ref 9.1–12.0)

## 2019-05-25 ENCOUNTER — Other Ambulatory Visit: Payer: Self-pay

## 2019-05-25 DIAGNOSIS — S90829S Blister (nonthermal), unspecified foot, sequela: Secondary | ICD-10-CM

## 2019-05-25 NOTE — Progress Notes (Unsigned)
Ref derm

## 2019-06-03 ENCOUNTER — Other Ambulatory Visit: Payer: Self-pay | Admitting: Family Medicine

## 2019-06-03 DIAGNOSIS — Z7901 Long term (current) use of anticoagulants: Secondary | ICD-10-CM

## 2019-06-04 DIAGNOSIS — L570 Actinic keratosis: Secondary | ICD-10-CM | POA: Diagnosis not present

## 2019-06-09 ENCOUNTER — Other Ambulatory Visit: Payer: Self-pay

## 2019-06-09 ENCOUNTER — Other Ambulatory Visit: Payer: Medicare Other

## 2019-06-09 DIAGNOSIS — Z7901 Long term (current) use of anticoagulants: Secondary | ICD-10-CM

## 2019-06-10 LAB — PROTIME-INR
INR: 1.7 — ABNORMAL HIGH (ref 0.8–1.2)
Prothrombin Time: 17.9 s — ABNORMAL HIGH (ref 9.1–12.0)

## 2019-06-11 ENCOUNTER — Ambulatory Visit (INDEPENDENT_AMBULATORY_CARE_PROVIDER_SITE_OTHER): Payer: Medicare Other | Admitting: Family Medicine

## 2019-06-11 ENCOUNTER — Other Ambulatory Visit: Payer: Self-pay

## 2019-06-11 ENCOUNTER — Encounter: Payer: Self-pay | Admitting: Family Medicine

## 2019-06-11 VITALS — BP 130/70 | HR 80 | Ht 68.0 in | Wt 182.0 lb

## 2019-06-11 DIAGNOSIS — L239 Allergic contact dermatitis, unspecified cause: Secondary | ICD-10-CM

## 2019-06-11 MED ORDER — HYDROXYZINE HCL 10 MG PO TABS: 10.0000 mg | ORAL_TABLET | Freq: Three times a day (TID) | ORAL | 0 refills | Status: DC | PRN

## 2019-06-11 MED ORDER — TRIAMCINOLONE ACETONIDE 0.1 % EX CREA: 1.0000 "application " | TOPICAL_CREAM | Freq: Two times a day (BID) | CUTANEOUS | 0 refills | Status: DC

## 2019-06-11 MED ORDER — CHLORHEXIDINE GLUCONATE 4 % EX LIQD: Freq: Every day | CUTANEOUS | 0 refills | Status: DC | PRN

## 2019-06-11 MED ORDER — MUPIROCIN 2 % EX OINT: 1.0000 "application " | TOPICAL_OINTMENT | Freq: Two times a day (BID) | CUTANEOUS | 0 refills | Status: DC

## 2019-06-11 NOTE — Patient Instructions (Signed)
Bee, Wasp, or [employer], Adult Bees, wasps, and hornets are part of a family of insects that can sting people. These stings can cause pain and inflammation, but they are usually not serious. However, some people may have an allergic reaction to a sting. This can cause the symptoms to be more severe. What increases the risk? You may be at a greater risk of getting stung if you:  Provoke a stinging insect by swatting or disturbing it.  Wear strong-smelling soaps, deodorants, or body sprays.  Spend time outdoors near gardens with flowers or fruit trees or in clothes that expose skin.  Eat or drink outside. What are the signs or symptoms? Common symptoms of this condition include:  A red lump in the skin that sometimes has a tiny hole in the center. In some cases, a stinger may be in the center of the wound.  Pain and itching at the sting site.  Redness and swelling around the sting site. If you have an allergic reaction (localized allergic reaction), the swelling and redness may spread out from the sting site. In some cases, this reaction can continue to develop over the next 24-48 hours. In rare cases, a person may have a severe allergic reaction (anaphylactic reaction) to a sting. Symptoms of an anaphylactic reaction may include:  Wheezing or difficulty breathing.  Raised, itchy, red patches on the skin (hives).  Nausea or vomiting.  Abdominal cramping.  Diarrhea.  Tightness in the chest or chest pain.  Dizziness or fainting.  Redness of the face (flushing).  Hoarse voice.  Swollen tongue, lips, or face. How is this diagnosed? This condition is usually diagnosed based on your symptoms and medical history as well as a physical exam. You may have an allergy test to determine if you are allergic to the substance that the insect injected during the sting (venom). How is this treated? If you were stung by a bee, the stinger and a small sac of venom may be in the wound. It is  important to remove the stinger as soon as possible. You can do this by brushing across the wound with gauze, a fingernail, or a flat card such as a credit card. Removing the stinger can help reduce the severity of your body's reaction to the sting. Most stings can be treated with:  Icing to reduce swelling in the area.  Medicines (antihistamines) to treat itching or an allergic reaction.  Medicines to help reduce pain. These may be medicines that you take by mouth, or medicated creams or lotions that you apply to your skin. Pay close attention to your symptoms after you have been stung. If possible, have someone stay with you to make sure you do not have an allergic reaction. If you have any signs of an allergic reaction, call your health care provider. If you have ever had a severe allergic reaction, your health care provider may give you an inhaler or injectable medicine (epinephrine auto-injector) to use if necessary. Follow these instructions at home:   Wash the sting site 2-3 times each day with soap and water as told by your health care provider.  Apply or take over-the-counter and prescription medicines only as told by your health care provider.  If directed, apply ice to the sting area. ? Put ice in a plastic bag. ? Place a towel between your skin and the bag. ? Leave the ice on for 20 minutes, 2-3 times a day.  Do not scratch the sting area.  If  you had a severe allergic reaction to a sting, you may need: ? To wear a medical bracelet or necklace that lists the allergy. ? To learn when and how to use an anaphylaxis kit or epinephrine injection. Your family members and coworkers may also need to learn this. ? To carry an anaphylaxis kit or epinephrine injection with you at all times. How is this prevented?  Avoid swatting at stinging insects and disturbing insect nests.  Do not use fragrant soaps or lotions.  Wear shoes, pants, and long sleeves when spending time outdoors,  especially in grassy areas where stinging insects are common.  Keep outdoor areas free from nests or hives.  Keep food and drink containers covered when eating outdoors.  Avoid working or sitting near flowering plants, if possible.  Wear gloves if you are gardening or working outdoors.  If an attack by a stinging insect or a swarm seems likely in the moment, move away from the area or find a barrier between you and the insect(s), such as a door. Contact a health care provider if:  Your symptoms do not get better in 2-3 days.  You have redness, swelling, or pain that spreads beyond the area of the sting.  You have a fever. Get help right away if: You have symptoms of a severe allergic reaction. These include:  Wheezing or difficulty breathing.  Tightness in the chest or chest pain.  Light-headedness or fainting.  Itchy, raised, red patches on the skin.  Nausea or vomiting.  Abdominal cramping.  Diarrhea.  A swollen tongue or lips, or trouble swallowing.  Dizziness or fainting. Summary  Stings from bees, wasps, and hornets can cause pain and inflammation, but they are usually not serious. However, some people may have an allergic reaction to a sting. This can cause the symptoms to be more severe.  Pay close attention to your symptoms after you have been stung. If possible, have someone stay with you to make sure you do not have an allergic reaction.  Call your health care provider if you have any signs of an allergic reaction. This information is not intended to replace advice given to you by your health care provider. Make sure you discuss any questions you have with your health care provider. Document Released: 11/19/2005 Document Revised: 11/14/2017 Document Reviewed: 01/24/2017 Elsevier Patient Education  2020 [employer] Inc.  

## 2019-06-11 NOTE — Progress Notes (Signed)
Date:  06/11/2019   Name:  [name] [facility]   DOB:  1951/02/27   MRN:  [id number]   Chief Complaint: Insect Bite (stung by a wasp on hand yesterday- swollen and can't make a fist- up to date on TDAP)  Rash This is a new (wasp sting) problem. The current episode started yesterday. The problem has been gradually worsening since onset. The affected locations include the left hand. The rash is characterized by redness, pain, itchiness and swelling. Associated with: wasp. Pertinent negatives include no anorexia, congestion, cough, diarrhea, eye pain, facial edema, fatigue, fever, joint pain, nail changes, rhinorrhea, shortness of breath, sore throat or vomiting. Past treatments include topical steroids. The treatment provided moderate relief.    Review of Systems  Constitutional: Negative.  Negative for chills, fatigue, fever and unexpected weight change.  HENT: Negative for congestion, drooling, ear discharge, ear pain, rhinorrhea, sinus pressure, sneezing and sore throat.   Eyes: Negative for photophobia, pain, discharge, redness and itching.  Respiratory: Negative for cough, shortness of breath, wheezing and stridor.   Cardiovascular: Negative for chest pain, palpitations and leg swelling.  Gastrointestinal: Negative for abdominal pain, anorexia, blood in stool, constipation, diarrhea, nausea and vomiting.  Endocrine: Negative for cold intolerance, heat intolerance, polydipsia, polyphagia and polyuria.  Genitourinary: Negative for dysuria, flank pain, frequency, hematuria, menstrual problem, pelvic pain, urgency, vaginal bleeding and vaginal discharge.  Musculoskeletal: Negative for arthralgias, back pain, joint pain, myalgias and neck pain.  Skin: Positive for rash. Negative for nail changes.  Allergic/Immunologic: Negative for environmental allergies and food allergies.  Neurological: Negative for dizziness, weakness, light-headedness, numbness and headaches.  Hematological: Negative for  adenopathy. Does not bruise/bleed easily.  Psychiatric/Behavioral: Negative for dysphoric mood and suicidal ideas. The patient is not nervous/anxious.     Patient Active Problem List   Diagnosis Date Noted  . Seasonal allergic rhinitis due to pollen 02/24/2019  . Age-related osteoporosis without current pathological fracture 01/20/2018  . Osteoporosis 11/12/2017  . Urinary incontinence 05/28/2017  . Knee pain 05/08/2017  . Muscle weakness 05/08/2017  . Sprain of MCL (medial collateral ligament) of knee 05/08/2017  . Adult hypothyroidism 10/23/2016  . Essential hypertension 10/23/2016  . Gastroesophageal reflux disease without esophagitis 10/23/2016  . Recurrent major depressive disorder, in full remission ([place]) 10/23/2016  . Anticoagulant long-term use 10/23/2016    Allergies  Allergen Reactions  . Penicillin G Rash    Past Surgical History:  Procedure Laterality Date  . CESAREAN SECTION    . CHOLECYSTECTOMY    . COLONOSCOPY  2012   repeat in 10 yrs  . KNEE ARTHROSCOPY W/ MENISCAL REPAIR Left     Social History   Tobacco Use  . Smoking status: Never Smoker  . Smokeless tobacco: Never Used  . Tobacco comment: Smoking cessation materials not required  Substance Use Topics  . Alcohol use: Yes    Alcohol/week: 1.0 standard drinks    Types: 1 Glasses of wine per week    Comment: occasional  . Drug use: No     Medication list has been reviewed and updated.  Current Meds  Medication Sig  . escitalopram (LEXAPRO) 20 MG tablet Take 1 tablet (20 mg total) by mouth daily.  . ferrous sulfate 325 (65 FE) MG EC tablet Take 1 tablet (325 mg total) by mouth daily with breakfast.  . levothyroxine (SYNTHROID, LEVOTHROID) 75 MCG tablet Take 1 tablet (75 mcg total) by mouth daily.  . traZODone (DESYREL) 50 MG tablet Take 1 tablet (  50 mg total) by mouth daily.  Marland Kitchen warfarin (COUMADIN) 1 MG tablet TAKE 1 TABLET BY MOUTH EVERY DAY  . warfarin (COUMADIN) 3 MG tablet Take 1 tablet (3  mg total) by mouth daily.  . [DISCONTINUED] albuterol (PROVENTIL HFA;VENTOLIN HFA) 108 (90 Base) MCG/ACT inhaler Inhale 2 puffs into the lungs every 6 (six) hours as needed for wheezing or shortness of breath.  . [DISCONTINUED] benzonatate (TESSALON) 100 MG capsule TAKE 1 CAPSULE (100 MG TOTAL) BY MOUTH 2 (TWO) TIMES DAILY AS NEEDED FOR COUGH.    PHQ 2/9 Scores 06/11/2019 03/17/2019 01/12/2019 12/24/2018  PHQ - 2 Score 0 0 0 0  PHQ- 9 Score 0 3 0 0    BP Readings from Last 3 Encounters:  06/11/19 130/70  03/17/19 120/60  02/24/19 120/80    Physical Exam Vitals signs and nursing note reviewed.  Constitutional:      Appearance: She is well-developed.  HENT:     Head: Normocephalic.     Right Ear: Tympanic membrane, ear canal and external ear normal.     Left Ear: Tympanic membrane, ear canal and external ear normal.     Nose: Nose normal.  Eyes:     General: Lids are everted, no foreign bodies appreciated. No scleral icterus.       Left eye: No foreign body or hordeolum.     Conjunctiva/sclera: Conjunctivae normal.     Right eye: Right conjunctiva is not injected.     Left eye: Left conjunctiva is not injected.     Pupils: Pupils are equal, round, and reactive to light.  Neck:     Musculoskeletal: Normal range of motion and neck supple.     Thyroid: No thyromegaly.     Vascular: No JVD.     Trachea: No tracheal deviation.  Cardiovascular:     Rate and Rhythm: Normal rate and regular rhythm.     Heart sounds: Normal heart sounds. No murmur. No friction rub. No gallop.   Pulmonary:     Effort: Pulmonary effort is normal. No respiratory distress.     Breath sounds: Normal breath sounds. No wheezing, rhonchi or rales.  Abdominal:     General: Bowel sounds are normal.     Palpations: Abdomen is soft. There is no mass.     Tenderness: There is no abdominal tenderness. There is no guarding or rebound.  Musculoskeletal: Normal range of motion.        General: No tenderness.   Lymphadenopathy:     Cervical: No cervical adenopathy.  Skin:    General: Skin is warm.     Findings: Erythema present. No rash.  Neurological:     Mental Status: She is alert and oriented to person, place, and time.     Cranial Nerves: No cranial nerve deficit.     Deep Tendon Reflexes: Reflexes normal.  Psychiatric:        Mood and Affect: Mood is not anxious or depressed.     Wt Readings from Last 3 Encounters:  06/11/19 182 lb (82.6 kg)  03/17/19 175 lb (79.4 kg)  02/24/19 174 lb (78.9 kg)    BP 130/70   Pulse 80   Ht 5\' 8"  (1.727 m)   Wt 182 lb (82.6 kg)   BMI 27.67 kg/m   Assessment and Plan: 1. Dermal hypersensitivity reaction New onset.  Gradually increased swelling and erythema.  Patient is status post wasp sting from yesterday.  Area has been cleaned and patient has been using ice.  Will initiate some topical treatment with triamcinolone cream 0.1% and Bactroban.  She was also given instructions to use of Zyrtec in the morning and Atarax 10 mg at night patient was given instructions to clean wound with Hibiclens external liquid and to apply Bactroban at the time of the triamcinolone.  We will hold on antibiotic at this time because of patient's significant history of C. difficile.  Patient's tetanus status was reviewed and this is up-to-date - triamcinolone cream (KENALOG) 0.1 %; Apply 1 application topically 2 (two) times daily.  Dispense: 30 g; Refill: 0 - hydrOXYzine (ATARAX/VISTARIL) 10 MG tablet; Take 1 tablet (10 mg total) by mouth 3 (three) times daily as needed.  Dispense: 30 tablet; Refill: 0 - mupirocin ointment (BACTROBAN) 2 %; Apply 1 application topically 2 (two) times daily.  Dispense: 22 g; Refill: 0 - chlorhexidine (HIBICLENS) 4 % external liquid; Apply topically daily as needed.  Dispense: 120 mL; Refill: 0

## 2019-06-14 ENCOUNTER — Other Ambulatory Visit: Payer: Self-pay | Admitting: Family Medicine

## 2019-06-14 DIAGNOSIS — R059 Cough, unspecified: Secondary | ICD-10-CM

## 2019-06-14 DIAGNOSIS — R05 Cough: Secondary | ICD-10-CM

## 2019-06-15 ENCOUNTER — Ambulatory Visit
Admission: EM | Admit: 2019-06-15 | Discharge: 2019-06-15 | Disposition: A | Discharge status: Home / Self Care | Payer: Medicare Other | Attending: Family Medicine | Admitting: Family Medicine

## 2019-06-15 ENCOUNTER — Other Ambulatory Visit: Payer: Self-pay

## 2019-06-15 DIAGNOSIS — S61412A Laceration without foreign body of left hand, initial encounter: Secondary | ICD-10-CM

## 2019-06-15 DIAGNOSIS — Y92000 Kitchen of unspecified non-institutional (private) residence as  the place of occurrence of the external cause: Secondary | ICD-10-CM | POA: Diagnosis not present

## 2019-06-15 DIAGNOSIS — Z23 Encounter for immunization: Secondary | ICD-10-CM | POA: Diagnosis not present

## 2019-06-15 MED ORDER — TETANUS-DIPHTH-ACELL PERTUSSIS 5-2.5-18.5 LF-MCG/0.5 IM SUSP
0.5000 mL | Freq: Once | INTRAMUSCULAR | Status: AC
  Administered 2019-06-15: 09:00:00 0.5 mL via INTRAMUSCULAR

## 2019-06-15 MED ORDER — MUPIROCIN 2 % EX OINT: TOPICAL_OINTMENT | CUTANEOUS | 0 refills | Status: DC

## 2019-06-15 NOTE — Discharge Instructions (Addendum)
Use medication as prescribed. Keep clean and dry. Monitor as discussed. The wound needs to be reevaluated for any redness, swelling, pain, drainage, or worsening appearance.    Follow up with your primary care physician this week as needed. Return to Urgent care for new or worsening concerns.

## 2019-06-15 NOTE — ED Triage Notes (Signed)
Patient complains of laceration to her left index finger while cutting chicken last night around 530pm. Patient states that she went to [place] and couldn't wait any longer.

## 2019-06-15 NOTE — ED Provider Notes (Signed)
[facility] ____________________________________________  Time seen: Approximately 9:05 AM  I have reviewed the triage vital signs and the nursing notes.   HISTORY  Chief Complaint Laceration   HPI [name] is a 68 y.o. female presenting for evaluation of left hand laceration that occurred at 5:30 PM last night at home.  Patient states that she was cutting chicken and accidentally cut her left hand at the base of her second finger.  States that she did clean the area.  Went to the emergency room for treatment however the wait was too long so she left.  Patient initially states that her tetanus immunization is up-to-date, however unable to find record and the [place] called patient's primary doctor and will update tetanus at this time.  States the area is minimally tender.  Has not had continued bleeding.  Reports otherwise doing well.  Denies other injuries.  Patient does report she has had c.diff several times at the beginning of this year and is now finally cleared.  Juline Patch, MD: PCP   Past Medical History:  Diagnosis Date  . Depression   . Hyperlipidemia   . Hypertension   . Stroke ([place])   . Thyroid disease     Patient Active Problem List   Diagnosis Date Noted  . Seasonal allergic rhinitis due to pollen 02/24/2019  . Age-related osteoporosis without current pathological fracture 01/20/2018  . Osteoporosis 11/12/2017  . Urinary incontinence 05/28/2017  . Knee pain 05/08/2017  . Muscle weakness 05/08/2017  . Sprain of MCL (medial collateral ligament) of knee 05/08/2017  . Adult hypothyroidism 10/23/2016  . Essential hypertension 10/23/2016  . Gastroesophageal reflux disease without esophagitis 10/23/2016  . Recurrent major depressive disorder, in full remission ([place]) 10/23/2016  . Anticoagulant long-term use 10/23/2016    Past Surgical History:  Procedure Laterality Date  . CESAREAN SECTION    . CHOLECYSTECTOMY    . COLONOSCOPY  2012   repeat in 10 yrs  . KNEE ARTHROSCOPY W/ MENISCAL REPAIR Left      No current facility-administered medications for this encounter.   Current Outpatient Medications:  .  escitalopram (LEXAPRO) 20 MG tablet, Take 1 tablet (20 mg total) by mouth daily., Disp: 90 tablet, Rfl: 1 .  ferrous sulfate 325 (65 FE) MG EC tablet, Take 1 tablet (325 mg total) by mouth daily with breakfast., Disp: 30 tablet, Rfl: 3 .  levothyroxine (SYNTHROID, LEVOTHROID) 75 MCG tablet, Take 1 tablet (75 mcg total) by mouth daily., Disp: 90 tablet, Rfl: 1 .  traZODone (DESYREL) 50 MG tablet, Take 1 tablet (50 mg total) by mouth daily., Disp: 90 tablet, Rfl: 1 .  triamcinolone cream (KENALOG) 0.1 %, Apply 1 application topically 2 (two) times daily., Disp: 30 g, Rfl: 0 .  warfarin (COUMADIN) 1 MG tablet, TAKE 1 TABLET BY MOUTH EVERY DAY, Disp: 90 tablet, Rfl: 0 .  warfarin (COUMADIN) 3 MG tablet, Take 1 tablet (3 mg total) by mouth daily., Disp: 60 tablet, Rfl: 2 .  mupirocin ointment (BACTROBAN) 2 %, Apply two times a day for 7 days., Disp: 22 g, Rfl: 0  Allergies Penicillin g  Family History  Problem Relation Age of Onset  . Stroke Father   . Breast cancer Maternal Grandmother   . Kidney cancer Mother     Social History Social History   Tobacco Use  . Smoking status: Never Smoker  . Smokeless tobacco: Never Used  . Tobacco comment: Smoking cessation materials not required  Substance Use Topics  .  Alcohol use: Yes    Alcohol/week: 1.0 standard drinks    Types: 1 Glasses of wine per week    Comment: occasional  . Drug use: No    Review of Systems Constitutional: No fever Cardiovascular: Denies chest pain. Respiratory: Denies shortness of breath. Gastrointestinal: No abdominal pain.  Musculoskeletal: Reports minimal left hand pain at laceration site. Skin: Left hand laceration.  ____________________________________________   PHYSICAL EXAM:  VITAL SIGNS: ED Triage Vitals  Enc Vitals Group      BP --      Pulse Rate 06/15/19 0839 74     Resp 06/15/19 0839 16     Temp 06/15/19 0839 98.7 F (37.1 C)     Temp Source 06/15/19 0839 Oral     SpO2 06/15/19 0839 100 %     Weight 06/15/19 0835 180 lb (81.6 kg)     Height 06/15/19 0835 5\' 8"  (1.727 m)     Head Circumference --      Peak Flow --      Pain Score 06/15/19 0835 3     Pain Loc --      Pain Edu? --      Excl. in [place]? --     Constitutional: Alert and oriented. Well appearing and in no acute distress. Eyes: Conjunctivae are normal.  ENT      Head: Normocephalic and atraumatic. Cardiovascular: Normal rate, regular rhythm. Grossly normal heart sounds.  Good peripheral circulation. Respiratory: Normal respiratory effort without tachypnea nor retractions. Breath sounds are clear and equal bilaterally. No wheezes, rales, rhonchi. Musculoskeletal: Steady gait. Neurologic:  Normal speech and language.  Speech is normal. No gait instability.  Skin:  Skin is warm, dry. Except: Left hand palmar aspect at the base of the second proximal phalanx at skin crease 2 cm linear laceration with minimal gaping, no active bleeding, wound base visible, no foreign body noted, no surrounding erythema, minimal tenderness, no bony tenderness, no drainage.  Left hand second digit normal distal sensation and capillary refill, full range of motion present no motor or tendon deficit noted. Psychiatric: Mood and affect are normal. Speech and behavior are normal. Patient exhibits appropriate insight and judgment   ___________________________________________   LABS (all labs ordered are listed, but only abnormal results are displayed)  Labs Reviewed - No data to display ____________________________________________   PROCEDURES Procedures   Procedure(s) performed:  Procedure explained and verbal consent obtained. Consent: Verbal consent obtained. Written consent not obtained. Risks and benefits: risks, benefits and alternatives were discussed  Patient identity confirmed: verbally with patient and hospital-assigned identification number  Consent given by: patient   Laceration Repair Location: Left hand Length: 2 cm Foreign bodies: no foreign bodies Tendon involvement: none Nerve involvement: none Preparation: Patient was prepped and draped in the usual sterile fashion. Anesthesia none Cleaned with Betadine Irrigation solution: saline Irrigation method: jet lavage Amount of cleaning: copious Repaired with 2 Steri-Strips Approximation: loose Patient tolerate well. Wound well approximated post repair.  Antibiotic ointment and dressing applied.  Wound care instructions provided.  Observe for any signs of infection or other problems.      INITIAL IMPRESSION / ASSESSMENT AND PLAN / ED COURSE  Pertinent labs & imaging results that were available during my care of the patient were reviewed by me and considered in my medical decision making (see chart for details).  Well-appearing patient.  No acute distress.  Left hand laceration as above.  Wound copiously cleaned and irrigated.  Discussed risk and benefits with  patient including potential infection.  Also discussed prophylactic antibiotics.  However as patient has had C. difficile recently patient declined and will defer oral antibiotics.  Will treat with topical Bactroban.  Discussed wound care, Steri-Strip, and monitoring.  Finger splint given.  Tetanus immunization updated.Discussed indication, risks and benefits of medications with patient.  Discussed follow up with Primary care physician this week. Discussed follow up and return parameters including no resolution or any worsening concerns. Patient verbalized understanding and agreed to plan.   ____________________________________________   FINAL CLINICAL IMPRESSION(S) / ED DIAGNOSES  Final diagnoses:  Laceration of left hand without foreign body, initial encounter     ED Discharge Orders         Ordered     mupirocin ointment (BACTROBAN) 2 %     06/15/19 0907           Note: This dictation was prepared with Dragon dictation along with smaller phrase technology. Any transcriptional errors that result from this process are unintentional.         Marylene Land, NP 06/15/19 [phone]

## 2019-06-24 ENCOUNTER — Other Ambulatory Visit: Payer: Self-pay

## 2019-06-24 ENCOUNTER — Ambulatory Visit: Payer: Medicare Other

## 2019-06-24 DIAGNOSIS — Z7901 Long term (current) use of anticoagulants: Secondary | ICD-10-CM

## 2019-06-25 DIAGNOSIS — L570 Actinic keratosis: Secondary | ICD-10-CM | POA: Diagnosis not present

## 2019-06-25 LAB — PROTIME-INR
INR: 2.5 — ABNORMAL HIGH (ref 0.8–1.2)
Prothrombin Time: 26.1 s — ABNORMAL HIGH (ref 9.1–12.0)

## 2019-06-30 DIAGNOSIS — N183 Chronic kidney disease, stage 3 (moderate): Secondary | ICD-10-CM | POA: Diagnosis not present

## 2019-06-30 DIAGNOSIS — N179 Acute kidney failure, unspecified: Secondary | ICD-10-CM | POA: Diagnosis not present

## 2019-07-03 DIAGNOSIS — R601 Generalized edema: Secondary | ICD-10-CM | POA: Diagnosis not present

## 2019-07-03 DIAGNOSIS — N183 Chronic kidney disease, stage 3 (moderate): Secondary | ICD-10-CM | POA: Diagnosis not present

## 2019-07-03 DIAGNOSIS — N179 Acute kidney failure, unspecified: Secondary | ICD-10-CM | POA: Diagnosis not present

## 2019-07-23 ENCOUNTER — Other Ambulatory Visit: Payer: Self-pay

## 2019-07-23 ENCOUNTER — Other Ambulatory Visit: Payer: Medicare Other

## 2019-07-23 DIAGNOSIS — Z7901 Long term (current) use of anticoagulants: Secondary | ICD-10-CM | POA: Diagnosis not present

## 2019-07-24 LAB — PROTIME-INR
INR: 2.3 — ABNORMAL HIGH (ref 0.8–1.2)
Prothrombin Time: 23.4 s — ABNORMAL HIGH (ref 9.1–12.0)

## 2019-08-04 ENCOUNTER — Other Ambulatory Visit: Payer: Self-pay | Admitting: Family Medicine

## 2019-08-04 DIAGNOSIS — F3342 Major depressive disorder, recurrent, in full remission: Secondary | ICD-10-CM

## 2019-08-06 DIAGNOSIS — Z23 Encounter for immunization: Secondary | ICD-10-CM | POA: Diagnosis not present

## 2019-08-12 ENCOUNTER — Other Ambulatory Visit: Payer: Self-pay

## 2019-08-12 ENCOUNTER — Other Ambulatory Visit: Payer: Self-pay | Admitting: Family Medicine

## 2019-08-12 ENCOUNTER — Other Ambulatory Visit: Payer: Medicare Other

## 2019-08-12 DIAGNOSIS — Z7901 Long term (current) use of anticoagulants: Secondary | ICD-10-CM | POA: Diagnosis not present

## 2019-08-12 DIAGNOSIS — F3342 Major depressive disorder, recurrent, in full remission: Secondary | ICD-10-CM

## 2019-08-13 LAB — PROTIME-INR
INR: 2.1 — ABNORMAL HIGH (ref 0.8–1.2)
Prothrombin Time: 21.9 s — ABNORMAL HIGH (ref 9.1–12.0)

## 2019-08-25 ENCOUNTER — Ambulatory Visit (INDEPENDENT_AMBULATORY_CARE_PROVIDER_SITE_OTHER): Payer: Medicare Other | Admitting: Family Medicine

## 2019-08-25 ENCOUNTER — Encounter: Payer: Self-pay | Admitting: Family Medicine

## 2019-08-25 ENCOUNTER — Other Ambulatory Visit: Payer: Self-pay

## 2019-08-25 VITALS — BP 120/76 | HR 64 | Ht 68.0 in | Wt 184.0 lb

## 2019-08-25 DIAGNOSIS — M7052 Other bursitis of knee, left knee: Secondary | ICD-10-CM | POA: Diagnosis not present

## 2019-08-25 DIAGNOSIS — F3342 Major depressive disorder, recurrent, in full remission: Secondary | ICD-10-CM

## 2019-08-25 DIAGNOSIS — N289 Disorder of kidney and ureter, unspecified: Secondary | ICD-10-CM | POA: Diagnosis not present

## 2019-08-25 DIAGNOSIS — D649 Anemia, unspecified: Secondary | ICD-10-CM

## 2019-08-25 DIAGNOSIS — Z8679 Personal history of other diseases of the circulatory system: Secondary | ICD-10-CM

## 2019-08-25 DIAGNOSIS — E7801 Familial hypercholesterolemia: Secondary | ICD-10-CM | POA: Diagnosis not present

## 2019-08-25 DIAGNOSIS — E039 Hypothyroidism, unspecified: Secondary | ICD-10-CM | POA: Diagnosis not present

## 2019-08-25 MED ORDER — ESCITALOPRAM OXALATE 20 MG PO TABS: 20.0000 mg | ORAL_TABLET | Freq: Every day | ORAL | 1 refills | Status: DC

## 2019-08-25 MED ORDER — TRAZODONE HCL 50 MG PO TABS: 50.0000 mg | ORAL_TABLET | Freq: Every day | ORAL | 1 refills | Status: DC

## 2019-08-25 MED ORDER — FERROUS SULFATE 325 (65 FE) MG PO TBEC: 325.0000 mg | DELAYED_RELEASE_TABLET | Freq: Every day | ORAL | 3 refills | Status: DC

## 2019-08-25 MED ORDER — LEVOTHYROXINE SODIUM 75 MCG PO TABS: 75.0000 ug | ORAL_TABLET | Freq: Every day | ORAL | 1 refills | Status: DC

## 2019-08-25 NOTE — Progress Notes (Signed)
Date:  08/25/2019   Name:  [name]   DOB:  29-Jan-1951   MRN:  [id number]   Chief Complaint: Depression, Insomnia, Hypothyroidism, Anemia (not taking ferrous sulfate- "was told to stop it"), Hypertension, and Cerebrovascular Accident (takes coumadin)  Depression        This is a chronic problem.  The current episode started more than 1 year ago.   The onset quality is sudden.   The problem has been gradually improving since onset.  Associated symptoms include insomnia.  Associated symptoms include no decreased concentration, no fatigue, no helplessness, no hopelessness, not irritable, no restlessness, no decreased interest, no appetite change, no body aches, no myalgias, no headaches, no indigestion, not sad and no suicidal ideas.     The symptoms are aggravated by nothing.  Past treatments include SSRIs - Selective serotonin reuptake inhibitors.  Compliance with treatment is good.  Previous treatment provided mild relief.  Past medical history includes thyroid problem.     Pertinent negatives include no anxiety. Insomnia Primary symptoms: fragmented sleep, no sleep disturbance, no difficulty falling asleep, no somnolence, no frequent awakening, no premature morning awakening, no malaise/fatigue, no napping.   The problem occurs intermittently. The symptoms are aggravated by anxiety. The treatment provided moderate relief. PMH includes: depression.  Anemia Presents for follow-up visit. There has been no abdominal pain, anorexia, bruising/bleeding easily, confusion, fever, leg swelling, light-headedness, malaise/fatigue, pallor, palpitations, paresthesias, pica or weight loss. Signs of blood loss that are not present include hematochezia. There is no history of chronic renal disease or heart failure. There are no compliance problems.   Hypertension This is a chronic problem. The current episode started more than 1 year ago. The problem has been gradually improving since onset. The problem  is controlled. Pertinent negatives include no anxiety, blurred vision, chest pain, headaches, malaise/fatigue, neck pain, orthopnea, palpitations, peripheral edema, PND, shortness of breath or sweats. There are no known risk factors for coronary artery disease. Past treatments include lifestyle changes. The current treatment provides moderate improvement. There are no compliance problems.  There is no history of angina, kidney disease, CAD/MI, CVA, heart failure, left ventricular hypertrophy, PVD or retinopathy. Identifiable causes of hypertension include a thyroid problem. There is no history of chronic renal disease, a hypertension causing med or renovascular disease.  Cerebrovascular Accident This is a chronic problem. The current episode started more than 1 year ago. The problem occurs intermittently. Pertinent negatives include no abdominal pain, anorexia, arthralgias, change in bowel habit, chest pain, chills, congestion, coughing, diaphoresis, fatigue, fever, headaches, joint swelling, myalgias, nausea, neck pain, numbness, rash, sore throat, swollen glands, urinary symptoms, vertigo, visual change, vomiting or weakness. The treatment provided moderate relief.  Thyroid Problem Presents for follow-up visit. Patient reports no anxiety, cold intolerance, constipation, depressed mood, diaphoresis, diarrhea, dry skin, fatigue, hair loss, heat intolerance, hoarse voice, leg swelling, menstrual problem, nail problem, palpitations, tremors, visual change, weight gain or weight loss. The symptoms have been stable. There is no history of heart failure.  Knee Pain  There was no injury mechanism. The pain is moderate. The pain has been fluctuating since onset. Pertinent negatives include no inability to bear weight, loss of motion, loss of sensation, muscle weakness, numbness or tingling. She has tried acetaminophen for the symptoms. The treatment provided moderate relief.    Review of Systems  Constitutional:  Negative for appetite change, chills, diaphoresis, fatigue, fever, malaise/fatigue, weight gain and weight loss.  HENT: Negative for congestion, drooling, ear discharge,  ear pain, hoarse voice and sore throat.   Eyes: Negative for blurred vision.  Respiratory: Negative for cough, shortness of breath and wheezing.   Cardiovascular: Negative for chest pain, palpitations, orthopnea, leg swelling and PND.  Gastrointestinal: Negative for abdominal pain, anorexia, blood in stool, change in bowel habit, constipation, diarrhea, hematochezia, nausea and vomiting.  Endocrine: Negative for cold intolerance, heat intolerance and polydipsia.  Genitourinary: Negative for dysuria, frequency, hematuria, menstrual problem and urgency.  Musculoskeletal: Negative for arthralgias, back pain, joint swelling, myalgias and neck pain.  Skin: Negative for pallor and rash.  Allergic/Immunologic: Negative for environmental allergies.  Neurological: Negative for dizziness, vertigo, tingling, tremors, weakness, light-headedness, numbness, headaches and paresthesias.  Hematological: Does not bruise/bleed easily.  Psychiatric/Behavioral: Positive for depression. Negative for confusion, decreased concentration, sleep disturbance and suicidal ideas. The patient has insomnia. The patient is not nervous/anxious.     Patient Active Problem List   Diagnosis Date Noted  . Seasonal allergic rhinitis due to pollen 02/24/2019  . Age-related osteoporosis without current pathological fracture 01/20/2018  . Osteoporosis 11/12/2017  . Urinary incontinence 05/28/2017  . Knee pain 05/08/2017  . Muscle weakness 05/08/2017  . Sprain of MCL (medial collateral ligament) of knee 05/08/2017  . Adult hypothyroidism 10/23/2016  . Essential hypertension 10/23/2016  . Gastroesophageal reflux disease without esophagitis 10/23/2016  . Recurrent major depressive disorder, in full remission ([place]) 10/23/2016  . Anticoagulant long-term use  10/23/2016    Allergies  Allergen Reactions  . Penicillin G Rash    Past Surgical History:  Procedure Laterality Date  . CESAREAN SECTION    . CHOLECYSTECTOMY    . COLONOSCOPY  2012   repeat in 10 yrs  . KNEE ARTHROSCOPY W/ MENISCAL REPAIR Left     Social History   Tobacco Use  . Smoking status: Never Smoker  . Smokeless tobacco: Never Used  . Tobacco comment: Smoking cessation materials not required  Substance Use Topics  . Alcohol use: Yes    Alcohol/week: 1.0 standard drinks    Types: 1 Glasses of wine per week    Comment: occasional  . Drug use: No     Medication list has been reviewed and updated.  Current Meds  Medication Sig  . Calcium Carbonate-Vit D-Min (CALCIUM 1200 PO) Take 1 tablet by mouth daily.  Marland Kitchen escitalopram (LEXAPRO) 20 MG tablet TAKE 1 TABLET BY MOUTH EVERY DAY  . levothyroxine (SYNTHROID, LEVOTHROID) 75 MCG tablet Take 1 tablet (75 mcg total) by mouth daily.  . Multiple Vitamins-Minerals (CENTRUM SILVER 50+WOMEN PO) Take 1 tablet by mouth daily.  . traZODone (DESYREL) 50 MG tablet TAKE 1 TABLET BY MOUTH EVERY DAY  . warfarin (COUMADIN) 1 MG tablet TAKE 1 TABLET BY MOUTH EVERY DAY  . warfarin (COUMADIN) 3 MG tablet Take 1 tablet (3 mg total) by mouth daily.    PHQ 2/9 Scores 08/25/2019 06/11/2019 03/17/2019 01/12/2019  PHQ - 2 Score 0 0 0 0  PHQ- 9 Score 1 0 3 0    BP Readings from Last 3 Encounters:  08/25/19 120/76  06/11/19 130/70  03/17/19 120/60    Physical Exam Vitals signs and nursing note reviewed.  Constitutional:      General: She is not irritable.    Appearance: She is well-developed.  HENT:     Head: Normocephalic.     Right Ear: Tympanic membrane, ear canal and external ear normal.     Left Ear: Tympanic membrane, ear canal and external ear normal.     Nose:  Nose normal.  Eyes:     General: Lids are everted, no foreign bodies appreciated. No scleral icterus.       Left eye: No foreign body or hordeolum.      Conjunctiva/sclera: Conjunctivae normal.     Right eye: Right conjunctiva is not injected.     Left eye: Left conjunctiva is not injected.     Pupils: Pupils are equal, round, and reactive to light.  Neck:     Musculoskeletal: Full passive range of motion without pain, normal range of motion and neck supple.     Thyroid: No thyromegaly.     Vascular: No JVD.     Trachea: No tracheal deviation.  Cardiovascular:     Rate and Rhythm: Normal rate and regular rhythm.     Pulses:          Carotid pulses are 2+ on the right side and 2+ on the left side.      Radial pulses are 2+ on the right side and 2+ on the left side.       Femoral pulses are 2+ on the right side and 2+ on the left side.      Popliteal pulses are 2+ on the right side and 2+ on the left side.       Dorsalis pedis pulses are 2+ on the right side and 2+ on the left side.       Posterior tibial pulses are 2+ on the right side and 2+ on the left side.     Heart sounds: Normal heart sounds, S1 normal and S2 normal. No murmur. No systolic murmur. No diastolic murmur. No friction rub. No gallop. No S3 or S4 sounds.   Pulmonary:     Effort: Pulmonary effort is normal. No respiratory distress.     Breath sounds: Normal breath sounds. No wheezing, rhonchi or rales.  Abdominal:     General: Bowel sounds are normal.     Palpations: Abdomen is soft. There is no mass.     Tenderness: There is no abdominal tenderness. There is no guarding or rebound.  Musculoskeletal: Normal range of motion.     Left knee: Tenderness found. Medial joint line and lateral joint line tenderness noted.     Right lower leg: No edema.     Left lower leg: No edema.     Comments: Tender popliteal bursa  Lymphadenopathy:     Cervical: No cervical adenopathy.  Skin:    General: Skin is warm.     Coloration: Skin is not jaundiced or pale.     Findings: No bruising, erythema, lesion or rash.  Neurological:     Mental Status: She is alert and oriented to  person, place, and time.     Cranial Nerves: No cranial nerve deficit.     Deep Tendon Reflexes: Reflexes normal.  Psychiatric:        Mood and Affect: Mood is not anxious or depressed.     Wt Readings from Last 3 Encounters:  08/25/19 184 lb (83.5 kg)  06/15/19 180 lb (81.6 kg)  06/11/19 182 lb (82.6 kg)    BP 120/76   Pulse 64   Ht 5\' 8"  (1.727 m)   Wt 184 lb (83.5 kg)   BMI 27.98 kg/m   Assessment and Plan:   1. Adult hypothyroidism Chronic.  Controlled.  Patient has been stable on levothyroxine 75 mcg daily.  Will check thyroid function panel with TSH to determine continue once. - Thyroid Panel  With TSH - levothyroxine (SYNTHROID) 75 MCG tablet; Take 1 tablet (75 mcg total) by mouth daily.  Dispense: 90 tablet; Refill: 1  2. Low hemoglobin Patient is noted to have a low hemoglobin in the past and we will check a CBC.  It was discussed with patient that even though the ferritin was within normal range it only reflects iron supplementation and that I would continue iron if there is further decrease continuance of the hemoglobin. - CBC w/Diff/Platelet - ferrous sulfate 325 (65 FE) MG EC tablet; Take 1 tablet (325 mg total) by mouth daily with breakfast.  Dispense: 30 tablet; Refill: 3  3. Recurrent major depressive disorder, in full remission ([place]) PHQ 0 gad score 0 patient will continue Lexapro 20 mg once a day.  She will cannot continue trazodone 50 mg nightly for insomnia. - escitalopram (LEXAPRO) 20 MG tablet; Take 1 tablet (20 mg total) by mouth daily.  Dispense: 90 tablet; Refill: 1 - traZODone (DESYREL) 50 MG tablet; Take 1 tablet (50 mg total) by mouth daily.  Dispense: 90 tablet; Refill: 1  4. Popliteal bursitis of left knee Patient has had left knee pain in the posterior aspect this is tender as well as tenderness in the medial joint line as well as lateral joint line.  This is likely a popliteal bursitis with communication in the joint space.  Patient was instructed  to continue over-the-counter NSAIDs as needed.  5. Renal insufficiency Patient with history of renal cysts patient say which is followed.  We will can continue with a renal function panel to evaluate - Renal Function Panel  6. Familial hypercholesterolemia Chronic.  Controlled.  Check lipid panel for evaluation. - Lipid Panel With LDL/HDL Ratio  7. History of essential hypertension Patient has a history of essential hypertension but blood pressure is in normal range with no medications and only a vigilance of sodium intake.  We will continue with this regimen and encourage continued weight loss.

## 2019-08-25 NOTE — Patient Instructions (Signed)
Why follow it? Research shows. . Those who follow the Mediterranean diet have a reduced risk of heart disease  . The diet is associated with a reduced incidence of Parkinson's and Alzheimer's diseases . People following the diet may have longer life expectancies and lower rates of chronic diseases  . The Dietary Guidelines for Americans recommends the Mediterranean diet as an eating plan to promote health and prevent disease  What Is the Mediterranean Diet?  . Healthy eating plan based on typical foods and recipes of Mediterranean-style cooking . The diet is primarily a plant based diet; these foods should make up a majority of meals   Starches - Plant based foods should make up a majority of meals - They are an important sources of vitamins, minerals, energy, antioxidants, and fiber - Choose whole grains, foods high in fiber and minimally processed items  - Typical grain sources include wheat, oats, barley, corn, brown rice, bulgar, farro, millet, polenta, couscous  - Various types of beans include chickpeas, lentils, fava beans, black beans, white beans   Fruits  Veggies - Large quantities of antioxidant rich fruits & veggies; 6 or more servings  - Vegetables can be eaten raw or lightly drizzled with oil and cooked  - Vegetables common to the traditional Mediterranean Diet include: artichokes, arugula, beets, broccoli, brussel sprouts, cabbage, carrots, celery, collard greens, cucumbers, eggplant, kale, leeks, lemons, lettuce, mushrooms, okra, onions, peas, peppers, potatoes, pumpkin, radishes, rutabaga, shallots, spinach, sweet potatoes, turnips, zucchini - Fruits common to the Mediterranean Diet include: apples, apricots, avocados, cherries, clementines, dates, figs, grapefruits, grapes, melons, nectarines, oranges, peaches, pears, pomegranates, strawberries, tangerines  Fats - Replace butter and margarine with healthy oils, such as olive oil, canola oil, and tahini  - Limit nuts to no  more than a handful a day  - Nuts include walnuts, almonds, pecans, pistachios, pine nuts  - Limit or avoid candied, honey roasted or heavily salted nuts - Olives are central to the Mediterranean diet - can be eaten whole or used in a variety of dishes   Meats Protein - Limiting red meat: no more than a few times a month - When eating red meat: choose lean cuts and keep the portion to the size of deck of cards - Eggs: approx. 0 to 4 times a week  - Fish and lean poultry: at least 2 a week  - Healthy protein sources include, chicken, turkey, lean beef, lamb - Increase intake of seafood such as tuna, salmon, trout, mackerel, shrimp, scallops - Avoid or limit high fat processed meats such as sausage and bacon  Dairy - Include moderate amounts of low fat dairy products  - Focus on healthy dairy such as fat free yogurt, skim milk, low or reduced fat cheese - Limit dairy products higher in fat such as whole or 2% milk, cheese, ice cream  Alcohol - Moderate amounts of red wine is ok  - No more than 5 oz daily for women (all ages) and men older than age 65  - No more than 10 oz of wine daily for men younger than 65  Other - Limit sweets and other desserts  - Use herbs and spices instead of salt to flavor foods  - Herbs and spices common to the traditional Mediterranean Diet include: basil, bay leaves, chives, cloves, cumin, fennel, garlic, lavender, marjoram, mint, oregano, parsley, pepper, rosemary, sage, savory, sumac, tarragon, thyme   It's not just a diet, it's a lifestyle:  . The Mediterranean diet includes   lifestyle factors typical of those in the region  . Foods, drinks and meals are best eaten with others and savored . Daily physical activity is important for overall good health . This could be strenuous exercise like running and aerobics . This could also be more leisurely activities such as walking, housework, yard-work, or taking the stairs . Moderation is the key; a balanced and  healthy diet accommodates most foods and drinks . Consider portion sizes and frequency of consumption of certain foods   Meal Ideas & Options:  . Breakfast:  o Whole wheat toast or whole wheat English muffins with peanut butter & hard boiled egg o Steel cut oats topped with apples & cinnamon and skim milk  o Fresh fruit: banana, strawberries, melon, berries, peaches  o Smoothies: strawberries, bananas, greek yogurt, peanut butter o Low fat greek yogurt with blueberries and granola  o Egg white omelet with spinach and mushrooms o Breakfast couscous: whole wheat couscous, apricots, skim milk, cranberries  . Sandwiches:  o Hummus and grilled vegetables (peppers, zucchini, squash) on whole wheat bread   o Grilled chicken on whole wheat pita with lettuce, tomatoes, cucumbers or tzatziki  o Tuna salad on whole wheat bread: tuna salad made with greek yogurt, olives, red peppers, capers, green onions o Garlic rosemary lamb pita: lamb sauted with garlic, rosemary, salt & pepper; add lettuce, cucumber, greek yogurt to pita - flavor with lemon juice and black pepper  . Seafood:  o Mediterranean grilled salmon, seasoned with garlic, basil, parsley, lemon juice and black pepper o Shrimp, lemon, and spinach whole-grain pasta salad made with low fat greek yogurt  o Seared scallops with lemon orzo  o Seared tuna steaks seasoned salt, pepper, coriander topped with tomato mixture of olives, tomatoes, olive oil, minced garlic, parsley, green onions and cappers  . Meats:  o Herbed greek chicken salad with kalamata olives, cucumber, feta  o Red bell peppers stuffed with spinach, bulgur, lean ground beef (or lentils) & topped with feta   o Kebabs: skewers of chicken, tomatoes, onions, zucchini, squash  o Turkey burgers: made with red onions, mint, dill, lemon juice, feta cheese topped with roasted red peppers . Vegetarian o Cucumber salad: cucumbers, artichoke hearts, celery, red onion, feta cheese, tossed in  olive oil & lemon juice  o Hummus and whole grain pita points with a greek salad (lettuce, tomato, feta, olives, cucumbers, red onion) o Lentil soup with celery, carrots made with vegetable broth, garlic, salt and pepper  o Tabouli salad: parsley, bulgur, mint, scallions, cucumbers, tomato, radishes, lemon juice, olive oil, salt and pepper.      

## 2019-08-26 LAB — CBC WITH DIFFERENTIAL/PLATELET
Basophils Absolute: 0.1 10*3/uL (ref 0.0–0.2)
Basos: 1 %
EOS (ABSOLUTE): 0.1 10*3/uL (ref 0.0–0.4)
Eos: 2 %
Hematocrit: 34.1 % (ref 34.0–46.6)
Hemoglobin: 11.4 g/dL (ref 11.1–15.9)
Immature Grans (Abs): 0 10*3/uL (ref 0.0–0.1)
Immature Granulocytes: 0 %
Lymphocytes Absolute: 1.5 10*3/uL (ref 0.7–3.1)
Lymphs: 26 %
MCH: 33.5 pg — ABNORMAL HIGH (ref 26.6–33.0)
MCHC: 33.4 g/dL (ref 31.5–35.7)
MCV: 100 fL — ABNORMAL HIGH (ref 79–97)
Monocytes Absolute: 0.4 10*3/uL (ref 0.1–0.9)
Monocytes: 7 %
Neutrophils Absolute: 3.6 10*3/uL (ref 1.4–7.0)
Neutrophils: 64 %
Platelets: 281 10*3/uL (ref 150–450)
RBC: 3.4 x10E6/uL — ABNORMAL LOW (ref 3.77–5.28)
RDW: 11.8 % (ref 11.7–15.4)
WBC: 5.6 10*3/uL (ref 3.4–10.8)

## 2019-08-26 LAB — RENAL FUNCTION PANEL
Albumin: 4.1 g/dL (ref 3.8–4.8)
BUN/Creatinine Ratio: 17 (ref 12–28)
BUN: 28 mg/dL — ABNORMAL HIGH (ref 8–27)
CO2: 23 mmol/L (ref 20–29)
Calcium: 10.3 mg/dL (ref 8.7–10.3)
Chloride: 102 mmol/L (ref 96–106)
Creatinine, Ser: 1.64 mg/dL — ABNORMAL HIGH (ref 0.57–1.00)
GFR calc Af Amer: 37 mL/min/{1.73_m2} — ABNORMAL LOW (ref >59)
GFR calc non Af Amer: 32 mL/min/{1.73_m2} — ABNORMAL LOW (ref >59)
Glucose: 93 mg/dL (ref 65–99)
Phosphorus: 4 mg/dL (ref 3.0–4.3)
Potassium: 4.2 mmol/L (ref 3.5–5.2)
Sodium: 139 mmol/L (ref 134–144)

## 2019-08-26 LAB — LIPID PANEL WITH LDL/HDL RATIO
Cholesterol, Total: 258 mg/dL — ABNORMAL HIGH (ref 100–199)
HDL: 59 mg/dL (ref >39)
LDL Chol Calc (NIH): 174 mg/dL — ABNORMAL HIGH (ref 0–99)
LDL/HDL Ratio: 2.9 ratio (ref 0.0–3.2)
Triglycerides: 137 mg/dL (ref 0–149)
VLDL Cholesterol Cal: 25 mg/dL (ref 5–40)

## 2019-08-26 LAB — THYROID PANEL WITH TSH
Free Thyroxine Index: 1.7 (ref 1.2–4.9)
T3 Uptake Ratio: 27 % (ref 24–39)
T4, Total: 6.2 ug/dL (ref 4.5–12.0)
TSH: 3.19 u[IU]/mL (ref 0.450–4.500)

## 2019-08-27 ENCOUNTER — Ambulatory Visit: Payer: Self-pay | Admitting: Family Medicine

## 2019-08-31 ENCOUNTER — Other Ambulatory Visit: Payer: Self-pay | Admitting: Family Medicine

## 2019-08-31 DIAGNOSIS — Z7901 Long term (current) use of anticoagulants: Secondary | ICD-10-CM

## 2019-10-09 ENCOUNTER — Other Ambulatory Visit: Payer: Self-pay | Admitting: Family Medicine

## 2019-10-09 DIAGNOSIS — L239 Allergic contact dermatitis, unspecified cause: Secondary | ICD-10-CM

## 2019-10-11 ENCOUNTER — Other Ambulatory Visit: Payer: Self-pay | Admitting: Family Medicine

## 2019-10-11 DIAGNOSIS — F3342 Major depressive disorder, recurrent, in full remission: Secondary | ICD-10-CM

## 2019-10-14 ENCOUNTER — Other Ambulatory Visit: Payer: Medicare Other

## 2019-10-14 DIAGNOSIS — Z7901 Long term (current) use of anticoagulants: Secondary | ICD-10-CM | POA: Diagnosis not present

## 2019-10-14 DIAGNOSIS — D649 Anemia, unspecified: Secondary | ICD-10-CM

## 2019-10-15 LAB — CBC WITH DIFFERENTIAL/PLATELET
Basophils Absolute: 0.1 10*3/uL (ref 0.0–0.2)
Basos: 1 %
EOS (ABSOLUTE): 0.1 10*3/uL (ref 0.0–0.4)
Eos: 2 %
Hematocrit: 35.8 % (ref 34.0–46.6)
Hemoglobin: 11.9 g/dL (ref 11.1–15.9)
Immature Grans (Abs): 0 10*3/uL (ref 0.0–0.1)
Immature Granulocytes: 0 %
Lymphocytes Absolute: 1.6 10*3/uL (ref 0.7–3.1)
Lymphs: 29 %
MCH: 33.1 pg — ABNORMAL HIGH (ref 26.6–33.0)
MCHC: 33.2 g/dL (ref 31.5–35.7)
MCV: 100 fL — ABNORMAL HIGH (ref 79–97)
Monocytes Absolute: 0.5 10*3/uL (ref 0.1–0.9)
Monocytes: 8 %
Neutrophils Absolute: 3.3 10*3/uL (ref 1.4–7.0)
Neutrophils: 60 %
Platelets: 291 10*3/uL (ref 150–450)
RBC: 3.59 x10E6/uL — ABNORMAL LOW (ref 3.77–5.28)
RDW: 11.2 % — ABNORMAL LOW (ref 11.7–15.4)
WBC: 5.6 10*3/uL (ref 3.4–10.8)

## 2019-10-15 LAB — PROTIME-INR
INR: 2.7 — ABNORMAL HIGH (ref 0.9–1.2)
Prothrombin Time: 28.2 s — ABNORMAL HIGH (ref 9.1–12.0)

## 2019-10-25 ENCOUNTER — Other Ambulatory Visit: Payer: Self-pay | Admitting: Family Medicine

## 2019-10-25 DIAGNOSIS — L239 Allergic contact dermatitis, unspecified cause: Secondary | ICD-10-CM

## 2019-10-27 ENCOUNTER — Other Ambulatory Visit: Payer: Self-pay

## 2019-10-27 ENCOUNTER — Other Ambulatory Visit: Payer: Medicare Other

## 2019-10-27 DIAGNOSIS — Z7901 Long term (current) use of anticoagulants: Secondary | ICD-10-CM | POA: Diagnosis not present

## 2019-10-28 DIAGNOSIS — N183 Chronic kidney disease, stage 3 unspecified: Secondary | ICD-10-CM | POA: Diagnosis not present

## 2019-10-28 DIAGNOSIS — N179 Acute kidney failure, unspecified: Secondary | ICD-10-CM | POA: Diagnosis not present

## 2019-10-28 DIAGNOSIS — R8290 Unspecified abnormal findings in urine: Secondary | ICD-10-CM | POA: Diagnosis not present

## 2019-10-28 DIAGNOSIS — R609 Edema, unspecified: Secondary | ICD-10-CM | POA: Diagnosis not present

## 2019-10-28 LAB — PROTIME-INR
INR: 3.1 — ABNORMAL HIGH (ref 0.9–1.2)
Prothrombin Time: 31.2 s — ABNORMAL HIGH (ref 9.1–12.0)

## 2019-10-29 ENCOUNTER — Other Ambulatory Visit: Payer: Self-pay | Admitting: Family Medicine

## 2019-10-29 DIAGNOSIS — F3342 Major depressive disorder, recurrent, in full remission: Secondary | ICD-10-CM

## 2019-11-03 ENCOUNTER — Ambulatory Visit (INDEPENDENT_AMBULATORY_CARE_PROVIDER_SITE_OTHER): Payer: Medicare Other | Admitting: Family Medicine

## 2019-11-03 ENCOUNTER — Other Ambulatory Visit: Payer: Self-pay

## 2019-11-03 ENCOUNTER — Encounter: Payer: Self-pay | Admitting: Family Medicine

## 2019-11-03 VITALS — BP 130/80 | HR 80 | Ht 68.0 in | Wt 184.0 lb

## 2019-11-03 DIAGNOSIS — M25562 Pain in left knee: Secondary | ICD-10-CM | POA: Diagnosis not present

## 2019-11-03 DIAGNOSIS — M7989 Other specified soft tissue disorders: Secondary | ICD-10-CM | POA: Diagnosis not present

## 2019-11-03 DIAGNOSIS — Z7901 Long term (current) use of anticoagulants: Secondary | ICD-10-CM | POA: Diagnosis not present

## 2019-11-03 DIAGNOSIS — G8929 Other chronic pain: Secondary | ICD-10-CM

## 2019-11-03 DIAGNOSIS — M7052 Other bursitis of knee, left knee: Secondary | ICD-10-CM

## 2019-11-03 MED ORDER — PREDNISONE 10 MG PO TABS: 10.0000 mg | ORAL_TABLET | Freq: Every day | ORAL | 0 refills | Status: DC

## 2019-11-03 NOTE — Addendum Note (Signed)
Addended by: Fredderick Severance on: 11/03/2019 04:02 PM   Modules accepted: Orders

## 2019-11-03 NOTE — Progress Notes (Signed)
Date:  11/03/2019   Name:  [name] [facility]   DOB:  [birth year]   MRN:  [id number]   Chief Complaint: Leg Pain (L) leg hurting behind the knee- was seen before on 08/25/2019  but it is not getting any better.)  Leg Pain  Incident onset: 3 months. There was no injury mechanism (walking). The pain is present in the left knee. The quality of the pain is described as aching. The pain is at a severity of 5/10. The pain is moderate. The pain has been intermittent (hurts all the time) since onset. Pertinent negatives include no inability to bear weight, loss of motion, loss of sensation, muscle weakness or numbness. The symptoms are aggravated by movement and palpation. She has tried acetaminophen for the symptoms. The treatment provided mild relief.  Thyroid Problem Symptoms include fatigue. Patient reports no anxiety, cold intolerance, constipation, diarrhea, heat intolerance or menstrual problem.    Lab Results  Component Value Date   CREATININE 1.64 (H) 08/25/2019   BUN 28 (H) 08/25/2019   NA 139 08/25/2019   K 4.2 08/25/2019   CL 102 08/25/2019   CO2 23 08/25/2019   Lab Results  Component Value Date   CHOL 258 (H) 08/25/2019   HDL 59 08/25/2019   LDLCALC 174 (H) 08/25/2019   TRIG 137 08/25/2019   CHOLHDL 3.4 09/09/2017   Lab Results  Component Value Date   TSH 3.190 08/25/2019   No results found for: HGBA1C   Review of Systems  Constitutional: Positive for fatigue. Negative for chills, fever and unexpected weight change.  HENT: Negative for congestion, ear discharge, ear pain, rhinorrhea, sinus pressure, sneezing and sore throat.   Eyes: Negative for photophobia, pain, discharge, redness and itching.  Respiratory: Negative for cough, shortness of breath, wheezing and stridor.   Gastrointestinal: Negative for abdominal pain, blood in stool, constipation, diarrhea, nausea and vomiting.  Endocrine: Negative for cold intolerance, heat intolerance, polydipsia, polyphagia and  polyuria.  Genitourinary: Negative for dysuria, flank pain, frequency, hematuria, menstrual problem, pelvic pain, urgency, vaginal bleeding and vaginal discharge.  Musculoskeletal: Negative for arthralgias, back pain and myalgias.  Skin: Negative for rash.  Allergic/Immunologic: Negative for environmental allergies and food allergies.  Neurological: Negative for dizziness, weakness, light-headedness, numbness and headaches.  Hematological: Negative for adenopathy. Does not bruise/bleed easily.  Psychiatric/Behavioral: Negative for dysphoric mood. The patient is not nervous/anxious.     Patient Active Problem List   Diagnosis Date Noted  . Popliteal bursitis of left knee 08/25/2019  . Familial hypercholesterolemia 08/25/2019  . Renal insufficiency 08/25/2019  . Low hemoglobin 08/25/2019  . History of essential hypertension 08/25/2019  . Seasonal allergic rhinitis due to pollen 02/24/2019  . Age-related osteoporosis without current pathological fracture 01/20/2018  . Osteoporosis 11/12/2017  . Urinary incontinence 05/28/2017  . Knee pain 05/08/2017  . Muscle weakness 05/08/2017  . Sprain of MCL (medial collateral ligament) of knee 05/08/2017  . Adult hypothyroidism 10/23/2016  . Essential hypertension 10/23/2016  . Gastroesophageal reflux disease without esophagitis 10/23/2016  . Recurrent major depressive disorder, in full remission ([place]) 10/23/2016  . Anticoagulant long-term use 10/23/2016    Allergies  Allergen Reactions  . Penicillin G Rash    Past Surgical History:  Procedure Laterality Date  . CESAREAN SECTION    . CHOLECYSTECTOMY    . COLONOSCOPY  2012   repeat in 10 yrs  . KNEE ARTHROSCOPY W/ MENISCAL REPAIR Left     Social History   Tobacco Use  .  Smoking status: Never Smoker  . Smokeless tobacco: Never Used  . Tobacco comment: Smoking cessation materials not required  Substance Use Topics  . Alcohol use: Yes    Alcohol/week: 1.0 standard drinks    Types:  1 Glasses of wine per week    Comment: occasional  . Drug use: No     Medication list has been reviewed and updated.  Current Meds  Medication Sig  . Calcium Carbonate-Vit D-Min (CALCIUM 1200 PO) Take 1 tablet by mouth daily.  Marland Kitchen escitalopram (LEXAPRO) 20 MG tablet TAKE 1 TABLET BY MOUTH EVERY DAY  . ferrous sulfate 325 (65 FE) MG EC tablet Take 1 tablet (325 mg total) by mouth daily with breakfast.  . levothyroxine (SYNTHROID) 75 MCG tablet Take 1 tablet (75 mcg total) by mouth daily.  . Multiple Vitamins-Minerals (CENTRUM SILVER 50+WOMEN PO) Take 1 tablet by mouth daily.  . traZODone (DESYREL) 50 MG tablet Take 1 tablet (50 mg total) by mouth daily.  Marland Kitchen warfarin (COUMADIN) 1 MG tablet TAKE 1 TABLET BY MOUTH EVERY DAY  . warfarin (COUMADIN) 3 MG tablet Take 1 tablet (3 mg total) by mouth daily.  . [DISCONTINUED] montelukast (SINGULAIR) 10 MG tablet TAKE 1 TABLET BY MOUTH EVERYDAY AT BEDTIME  . [DISCONTINUED] triamcinolone cream (KENALOG) 0.1 % APPLY TO AFFECTED AREA TWICE A DAY    PHQ 2/9 Scores 11/03/2019 08/25/2019 06/11/2019 03/17/2019  PHQ - 2 Score 0 0 0 0  PHQ- 9 Score 0 1 0 3    BP Readings from Last 3 Encounters:  11/03/19 130/80  08/25/19 120/76  06/11/19 130/70    Physical Exam Vitals signs and nursing note reviewed.  Constitutional:      Appearance: She is well-developed.  HENT:     Head: Normocephalic.     Right Ear: External ear normal.     Left Ear: External ear normal.  Eyes:     General: Lids are everted, no foreign bodies appreciated. No scleral icterus.       Left eye: No foreign body or hordeolum.     Conjunctiva/sclera: Conjunctivae normal.     Right eye: Right conjunctiva is not injected.     Left eye: Left conjunctiva is not injected.     Pupils: Pupils are equal, round, and reactive to light.  Neck:     Musculoskeletal: Normal range of motion and neck supple.     Thyroid: No thyromegaly.     Vascular: No JVD.     Trachea: No tracheal deviation.   Cardiovascular:     Rate and Rhythm: Normal rate and regular rhythm.     Heart sounds: Normal heart sounds. No murmur. No friction rub. No gallop.   Pulmonary:     Effort: Pulmonary effort is normal. No respiratory distress.     Breath sounds: Normal breath sounds. No wheezing or rales.  Abdominal:     General: Bowel sounds are normal.     Palpations: Abdomen is soft. There is no mass.     Tenderness: There is no abdominal tenderness. There is no guarding or rebound.  Musculoskeletal: Normal range of motion.        General: Tenderness present.     Left knee: She exhibits no swelling. Tenderness found. Medial joint line and lateral joint line tenderness noted.     Comments: Tender popliteal bursa  Lymphadenopathy:     Cervical: No cervical adenopathy.  Skin:    General: Skin is warm.     Findings: No rash.  Neurological:     Mental Status: She is alert and oriented to person, place, and time.     Cranial Nerves: No cranial nerve deficit.     Deep Tendon Reflexes: Reflexes normal.  Psychiatric:        Mood and Affect: Mood is not anxious or depressed.     Wt Readings from Last 3 Encounters:  11/03/19 184 lb (83.5 kg)  08/25/19 184 lb (83.5 kg)  06/15/19 180 lb (81.6 kg)    BP 130/80   Pulse 80   Ht 5\' 8"  (1.727 m)   Wt 184 lb (83.5 kg)   BMI 27.98 kg/m   Assessment and Plan: 1. Popliteal bursitis of left knee Patient has a recurrence or really never fully recovered from left knee pain which was noted to have tenderness in the popliteal bursa and along the joint lines several months ago.  This is had of the exacerbation with difficulty bearing weight and with activity.  Will refer to orthopedics in the meantime we will initiate some prednisone coupled with acetaminophen. - Ambulatory referral to Orthopedic Surgery  2. Anticoagulant long-term use Patient is on long-term anticoagulation with Coumadin because of a history of antiphospholipid antibiody syndrome.  Patient has  not had the trauma to suggest a hemarthrosis and this does not seem to reflect this at this time.  3. Left leg swelling There is a centimeters different of her left calf versus her right with varicose veins that are palpable bilateral but more so on the left leg I rather doubt that she has a DVT given that she is on chronic anticoagulation but we will given her history of the above APS do an ultrasound of her calf to rule out DVT. - VAS [place] LOWER EXTREMITY VENOUS (DVT); Future  4. Chronic pain of left knee This is been going on a couple months and has not gotten better and the patient still wants to remain rather active.  For this she ultimately may need an injection and we will refer to orthopedic surgery for evaluation. - Ambulatory referral to Orthopedic Surgery

## 2019-11-04 ENCOUNTER — Ambulatory Visit: Payer: Medicare Other

## 2019-11-04 ENCOUNTER — Ambulatory Visit
Admission: RE | Admit: 2019-11-04 | Discharge: 2019-11-04 | Disposition: A | Discharge status: Home / Self Care | Payer: Medicare Other | Source: Ambulatory Visit | Attending: Family Medicine | Admitting: Family Medicine

## 2019-11-04 ENCOUNTER — Telehealth: Payer: Self-pay

## 2019-11-04 DIAGNOSIS — M7989 Other specified soft tissue disorders: Secondary | ICD-10-CM | POA: Diagnosis not present

## 2019-11-04 DIAGNOSIS — R600 Localized edema: Secondary | ICD-10-CM | POA: Insufficient documentation

## 2019-11-04 DIAGNOSIS — M79662 Pain in left lower leg: Secondary | ICD-10-CM | POA: Diagnosis not present

## 2019-11-04 DIAGNOSIS — N1832 Chronic kidney disease, stage 3b: Secondary | ICD-10-CM | POA: Diagnosis not present

## 2019-11-04 DIAGNOSIS — N179 Acute kidney failure, unspecified: Secondary | ICD-10-CM | POA: Insufficient documentation

## 2019-11-04 DIAGNOSIS — N183 Chronic kidney disease, stage 3 unspecified: Secondary | ICD-10-CM | POA: Insufficient documentation

## 2019-11-04 IMAGING — US US EXTREM LOW VENOUS*L*
1 series · 13 of 24 positions shown · non-contrast
Comparison: None.

CLINICAL DATA: Left lower leg pain.  Evaluate for DVT.



[Series 1: us extrem low venous*left* · 0.09mm/px · 13 of 44 slices shown]
[im 1/44]
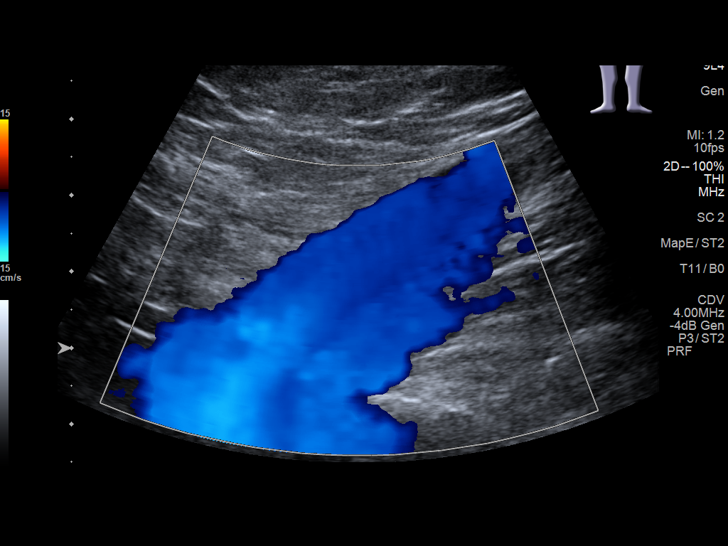
[im 4/44]
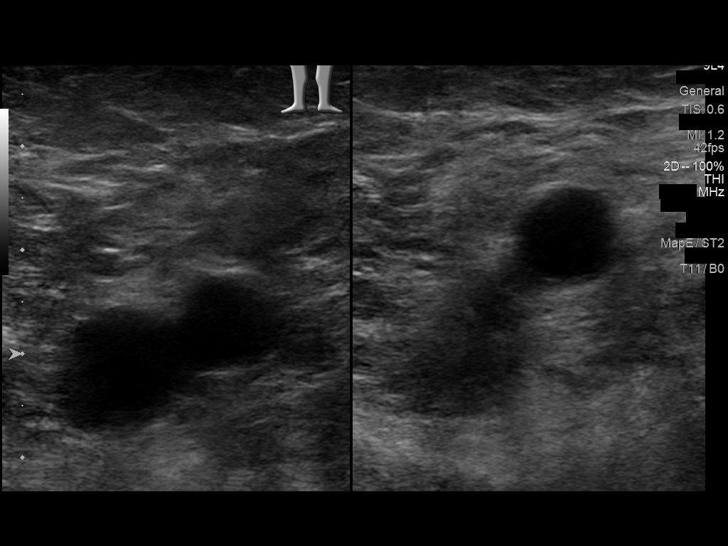
[im 8/44]
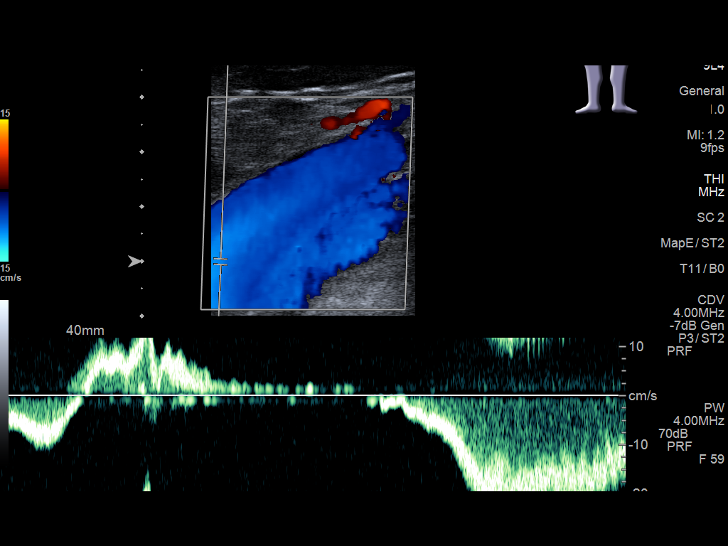
[im 12/44]
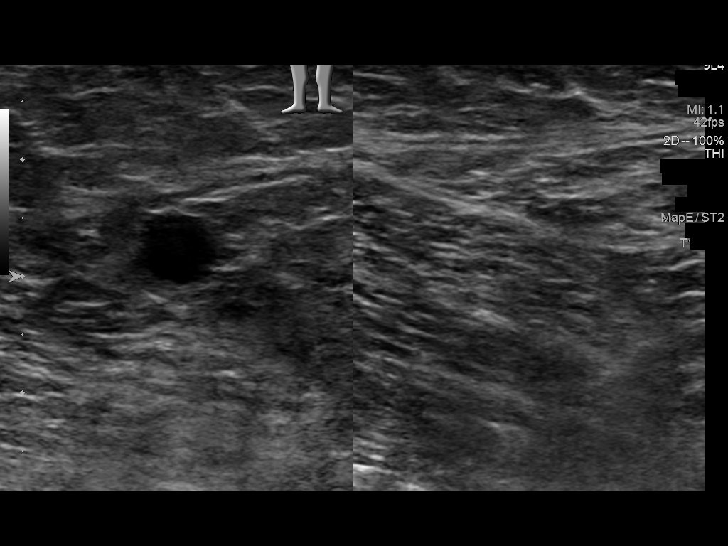
[im 15/44]
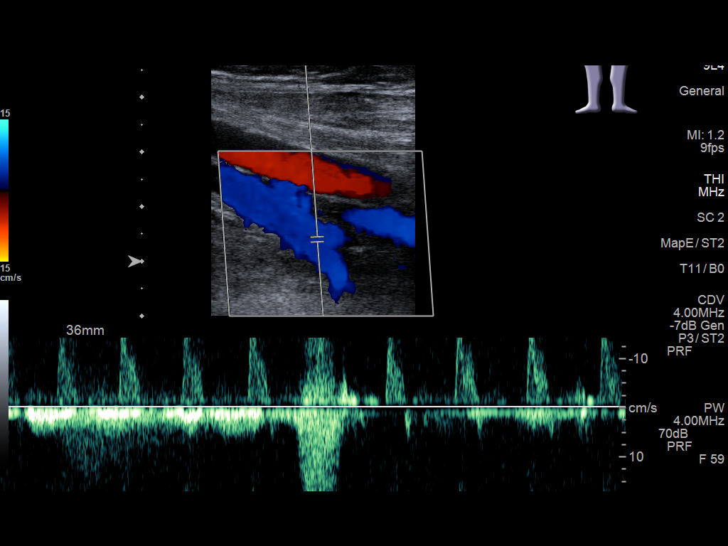
[im 19/44]
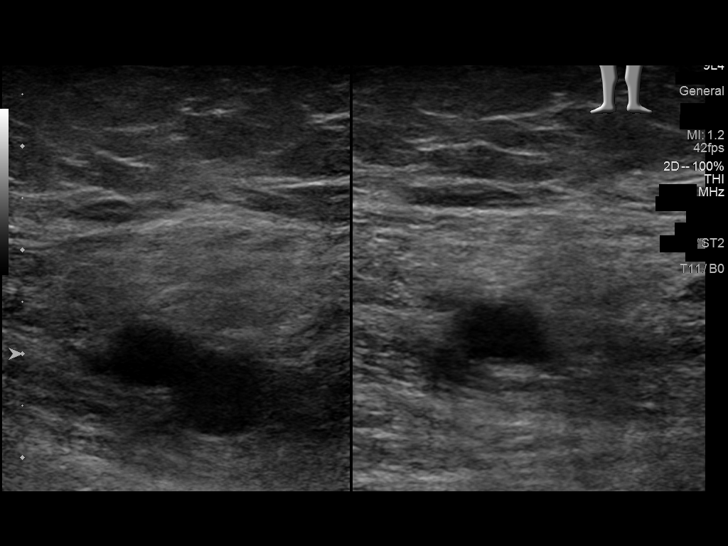
[im 23/44]
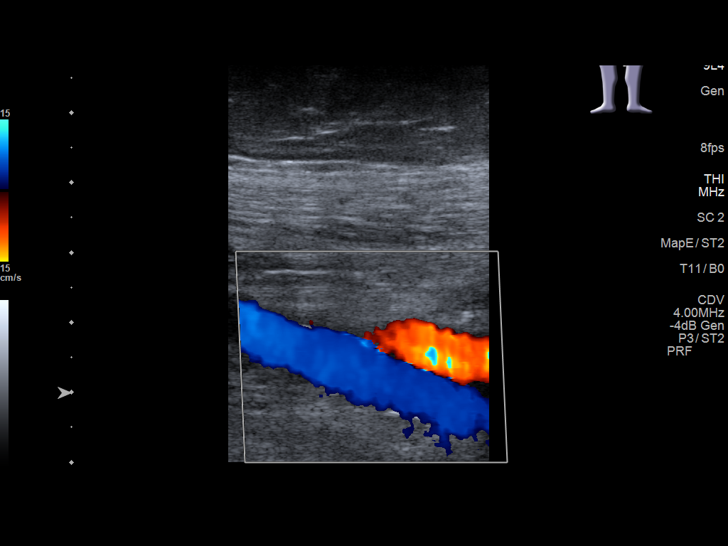
[im 25/44]
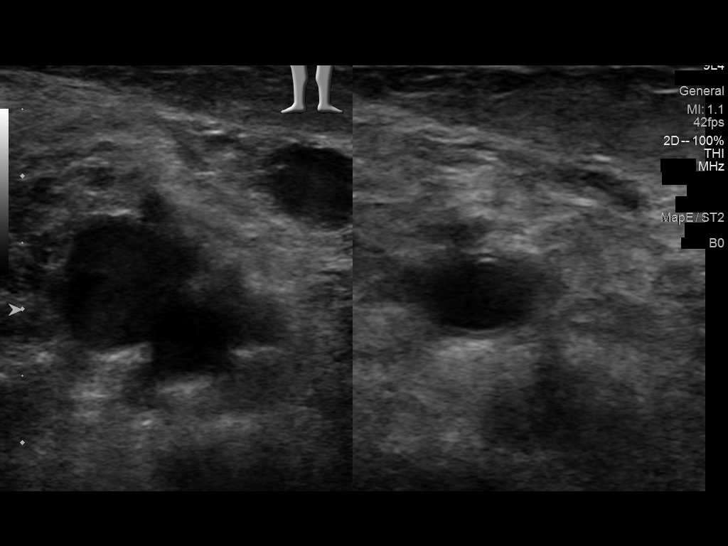
[im 29/44]
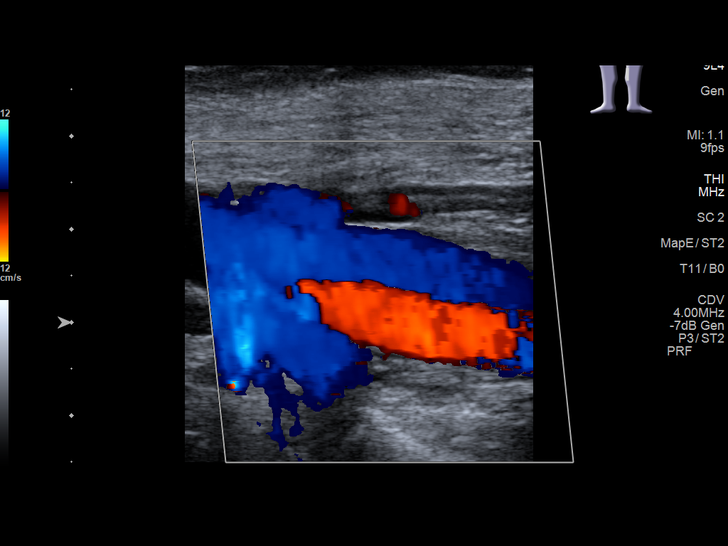
[im 32/44]
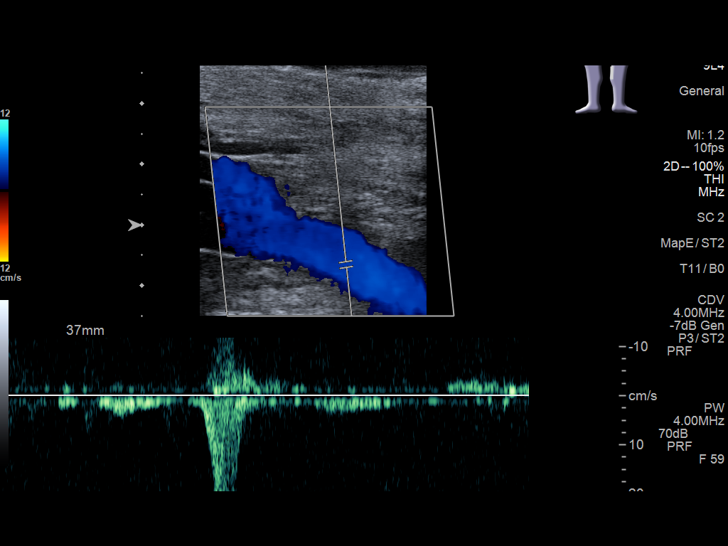
[im 36/44]
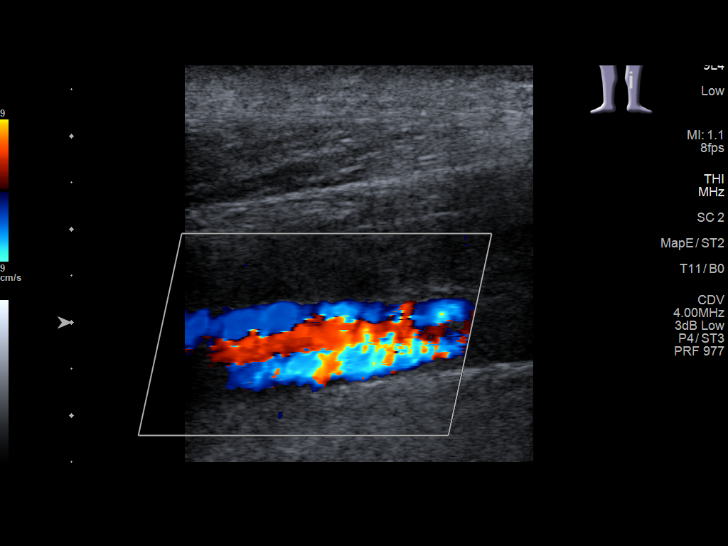
[im 40/44]
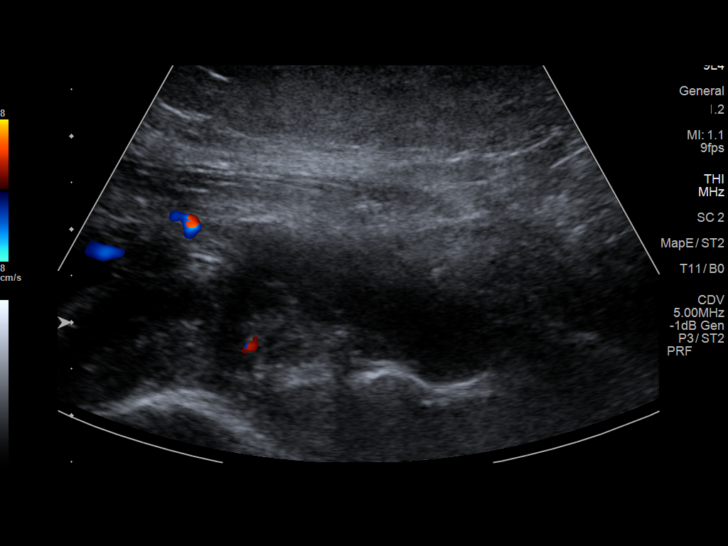
[im 44/44]
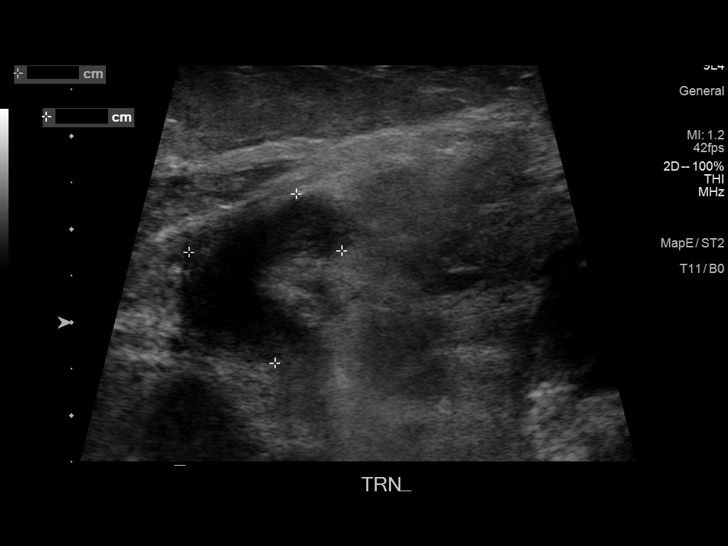

[13 of 24 positions shown; findings below may reference images not displayed]

FINDINGS: Contralateral Common Femoral Vein: Respiratory phasicity is normal
and symmetric with the symptomatic side. No evidence of thrombus.
Normal compressibility.

Common Femoral Vein: No evidence of thrombus. Normal
compressibility, respiratory phasicity and response to augmentation.

Saphenofemoral Junction: No evidence of thrombus. Normal
compressibility and flow on color Doppler imaging.

Profunda Femoral Vein: No evidence of thrombus. Normal
compressibility and flow on color Doppler imaging.

Femoral Vein: No evidence of thrombus. Normal compressibility,
respiratory phasicity and response to augmentation.

Popliteal Vein: No evidence of thrombus. Normal compressibility,
respiratory phasicity and response to augmentation.

Calf Veins: Visualized left deep calf veins are patent without
thrombus.

Superficial Great Saphenous Vein: No evidence of thrombus. Normal
compressibility.

Other Findings: Small hypoechoic collection at the popliteal fossa
measures 3.9 x 1.6 x 1.8 cm. No significant vascular flow within
this hypoechoic collection.
IMPRESSION: 1.  Negative for deep venous thrombosis in left lower extremity.
2. Hypoechoic collection in the left popliteal fossa. Findings are
most compatible with a Baker's cyst.

## 2019-11-04 NOTE — Telephone Encounter (Signed)
[facility] called with negative for DVT. Poss. Baker's cyst 3.9x1.6x1.8cm- proceed with Dr Marry Guan and kidney appt

## 2019-11-05 ENCOUNTER — Other Ambulatory Visit: Payer: Self-pay | Admitting: Family Medicine

## 2019-11-06 ENCOUNTER — Other Ambulatory Visit: Payer: Self-pay

## 2019-11-06 ENCOUNTER — Other Ambulatory Visit: Payer: Medicare Other

## 2019-11-06 DIAGNOSIS — Z7901 Long term (current) use of anticoagulants: Secondary | ICD-10-CM | POA: Diagnosis not present

## 2019-11-07 LAB — PROTIME-INR
INR: 3.2 — ABNORMAL HIGH (ref 0.9–1.2)
Prothrombin Time: 32.8 s — ABNORMAL HIGH (ref 9.1–12.0)

## 2019-11-08 ENCOUNTER — Other Ambulatory Visit: Payer: Self-pay | Admitting: Family Medicine

## 2019-11-08 DIAGNOSIS — F3342 Major depressive disorder, recurrent, in full remission: Secondary | ICD-10-CM

## 2019-11-09 DIAGNOSIS — M25562 Pain in left knee: Secondary | ICD-10-CM | POA: Diagnosis not present

## 2019-11-09 DIAGNOSIS — M1712 Unilateral primary osteoarthritis, left knee: Secondary | ICD-10-CM | POA: Diagnosis not present

## 2019-11-20 ENCOUNTER — Other Ambulatory Visit: Payer: Self-pay

## 2019-11-20 DIAGNOSIS — E7801 Familial hypercholesterolemia: Secondary | ICD-10-CM

## 2019-11-20 MED ORDER — ATORVASTATIN CALCIUM 10 MG PO TABS: 10.0000 mg | ORAL_TABLET | Freq: Every day | ORAL | 1 refills | Status: DC

## 2019-11-20 NOTE — Progress Notes (Unsigned)
Started on Atorv 10mg 

## 2019-11-22 ENCOUNTER — Other Ambulatory Visit: Payer: Self-pay | Admitting: Family Medicine

## 2019-11-22 DIAGNOSIS — Z7901 Long term (current) use of anticoagulants: Secondary | ICD-10-CM

## 2019-12-01 ENCOUNTER — Other Ambulatory Visit: Payer: Self-pay | Admitting: Family Medicine

## 2019-12-01 DIAGNOSIS — N3281 Overactive bladder: Secondary | ICD-10-CM | POA: Diagnosis not present

## 2019-12-06 ENCOUNTER — Other Ambulatory Visit: Payer: Self-pay | Admitting: Family Medicine

## 2019-12-06 DIAGNOSIS — R05 Cough: Secondary | ICD-10-CM

## 2019-12-06 DIAGNOSIS — R059 Cough, unspecified: Secondary | ICD-10-CM

## 2019-12-06 DIAGNOSIS — F3342 Major depressive disorder, recurrent, in full remission: Secondary | ICD-10-CM

## 2019-12-10 ENCOUNTER — Other Ambulatory Visit: Payer: Self-pay

## 2019-12-10 ENCOUNTER — Other Ambulatory Visit: Payer: Medicare Other

## 2019-12-10 DIAGNOSIS — Z7901 Long term (current) use of anticoagulants: Secondary | ICD-10-CM | POA: Diagnosis not present

## 2019-12-11 LAB — PROTIME-INR
INR: 2.5 — ABNORMAL HIGH (ref 0.9–1.2)
Prothrombin Time: 25.5 s — ABNORMAL HIGH (ref 9.1–12.0)

## 2019-12-15 ENCOUNTER — Other Ambulatory Visit: Payer: Self-pay | Admitting: Family Medicine

## 2019-12-15 DIAGNOSIS — E7801 Familial hypercholesterolemia: Secondary | ICD-10-CM

## 2019-12-21 ENCOUNTER — Other Ambulatory Visit: Payer: Self-pay | Admitting: Family Medicine

## 2019-12-21 DIAGNOSIS — Z7901 Long term (current) use of anticoagulants: Secondary | ICD-10-CM

## 2019-12-22 ENCOUNTER — Other Ambulatory Visit: Payer: Self-pay

## 2019-12-22 DIAGNOSIS — Z7901 Long term (current) use of anticoagulants: Secondary | ICD-10-CM

## 2019-12-22 MED ORDER — WARFARIN SODIUM 3 MG PO TABS: 3.0000 mg | ORAL_TABLET | Freq: Every day | ORAL | 2 refills | Status: DC

## 2019-12-24 DIAGNOSIS — Z23 Encounter for immunization: Secondary | ICD-10-CM | POA: Diagnosis not present

## 2019-12-29 ENCOUNTER — Ambulatory Visit: Payer: Medicare Other

## 2020-01-07 ENCOUNTER — Ambulatory Visit: Payer: Medicare Other

## 2020-01-07 ENCOUNTER — Other Ambulatory Visit: Payer: Medicare Other

## 2020-01-07 ENCOUNTER — Other Ambulatory Visit: Payer: Self-pay

## 2020-01-07 DIAGNOSIS — Z7901 Long term (current) use of anticoagulants: Secondary | ICD-10-CM

## 2020-01-08 LAB — PROTIME-INR
INR: 2.3 — ABNORMAL HIGH (ref 0.9–1.2)
Prothrombin Time: 24 s — ABNORMAL HIGH (ref 9.1–12.0)

## 2020-01-12 DIAGNOSIS — N3281 Overactive bladder: Secondary | ICD-10-CM | POA: Diagnosis not present

## 2020-01-12 IMAGING — US US RENAL
1 series · 14 of 25 positions shown · non-contrast
Comparison: None.

CLINICAL DATA: Acute kidney injury. Stage 3 chronic kidney disease.

EXAM:
RENAL / URINARY TRACT ULTRASOUND COMPLETE

[Series 1: us renal · 0.22mm/px · 14 of 31 slices shown]
[im 1/31]
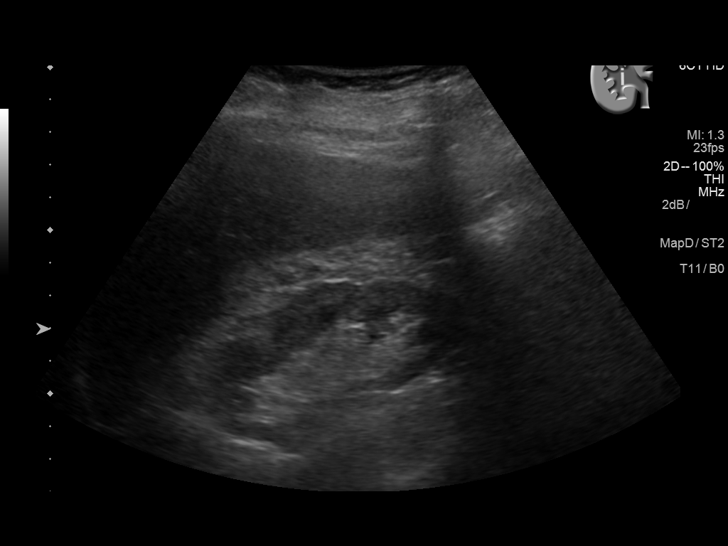
[im 3/31]
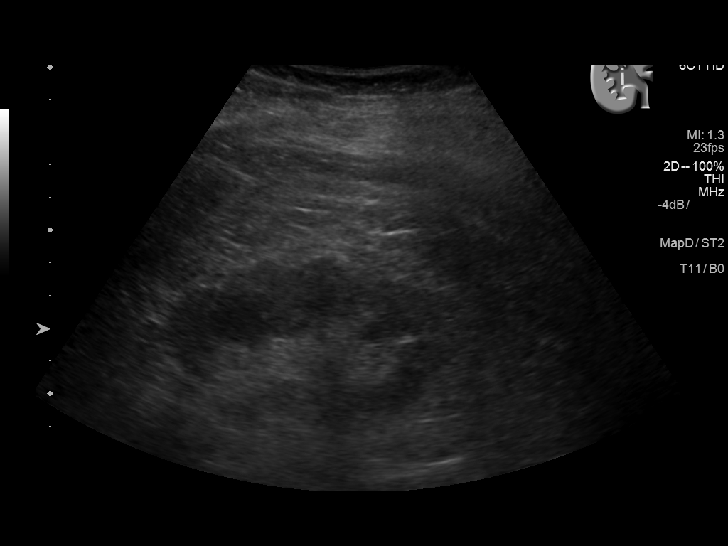
[im 6/31]
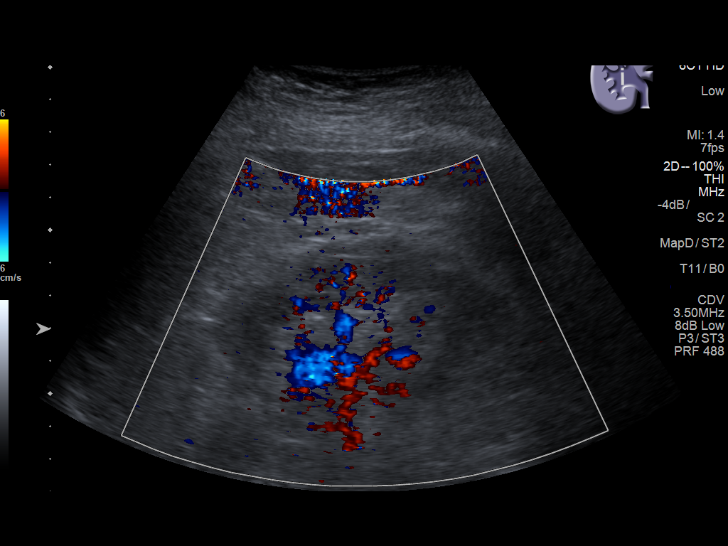
[im 8/31]
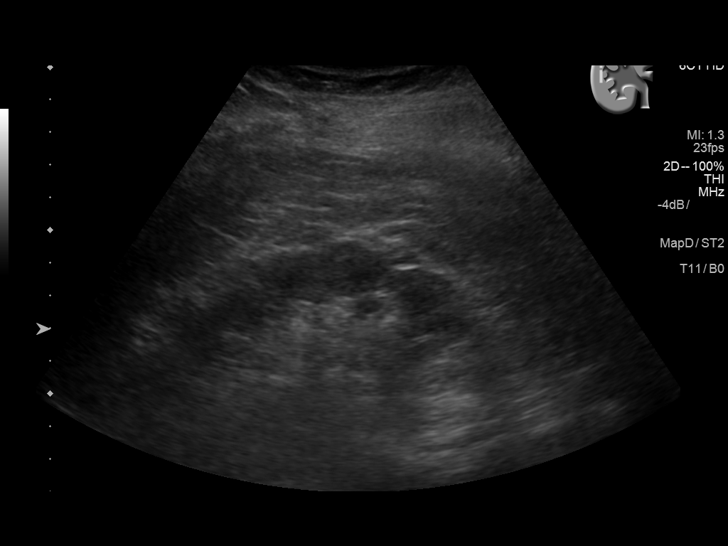
[im 11/31]
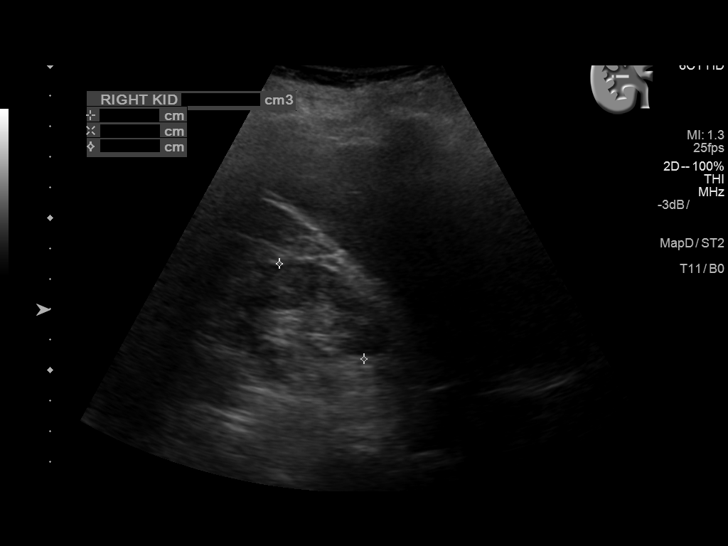
[im 12/31]
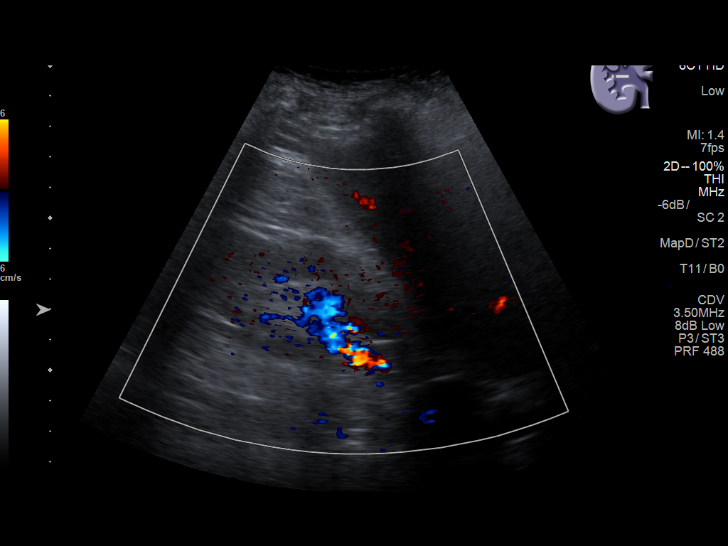
[im 14/31]
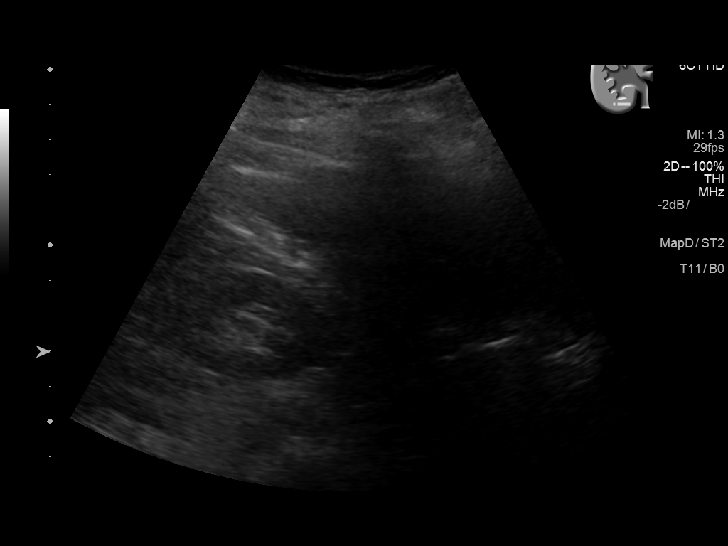
[im 17/31]
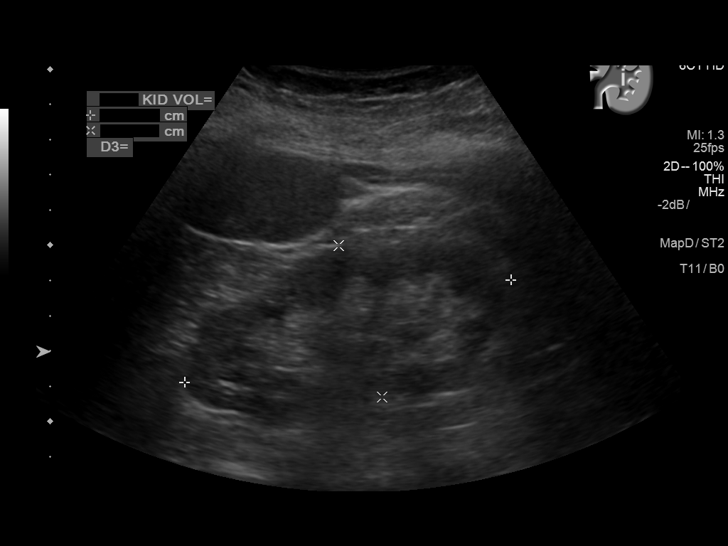
[im 19/31]
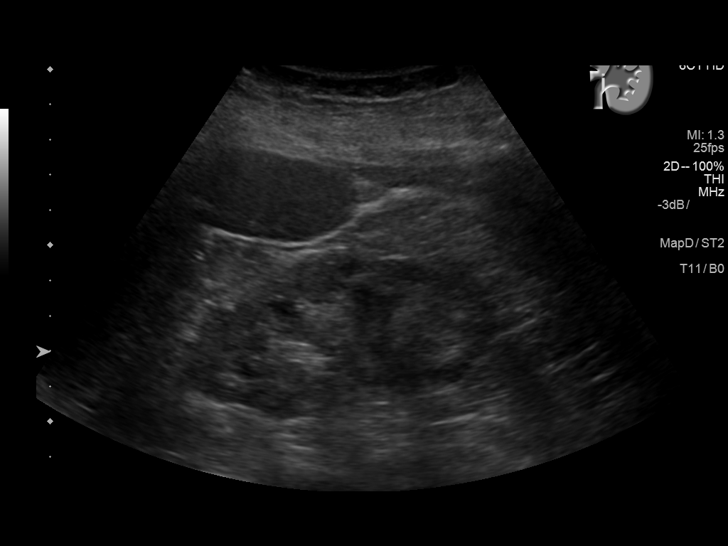
[im 21/31]
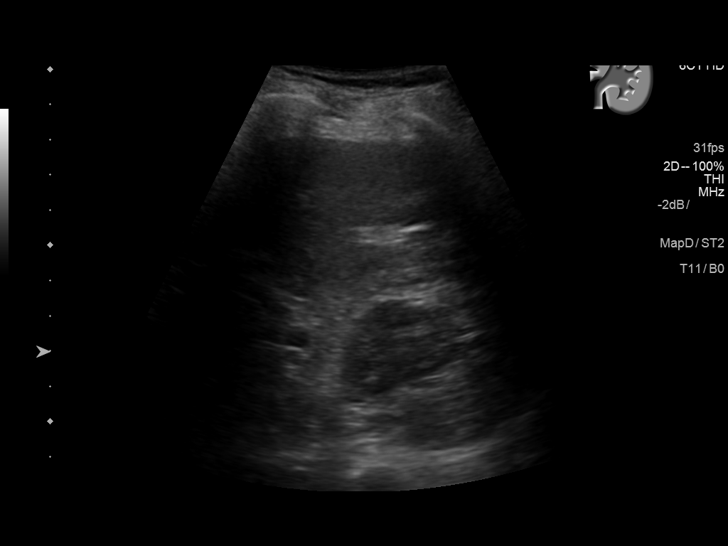
[im 23/31]
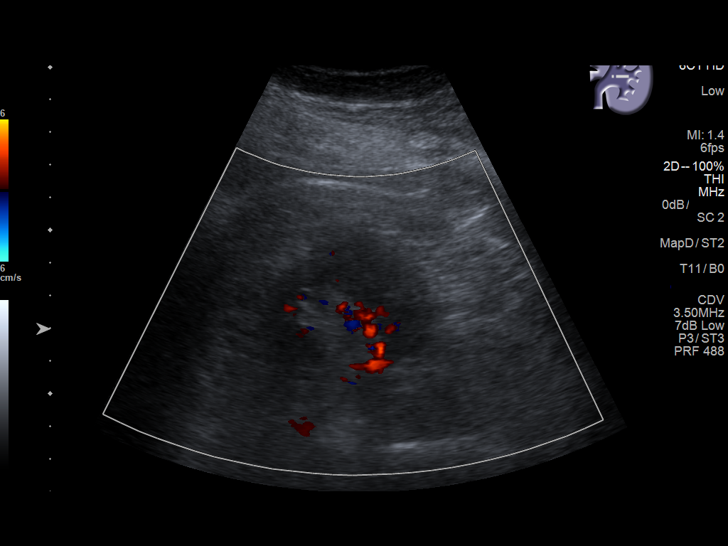
[im 26/31]
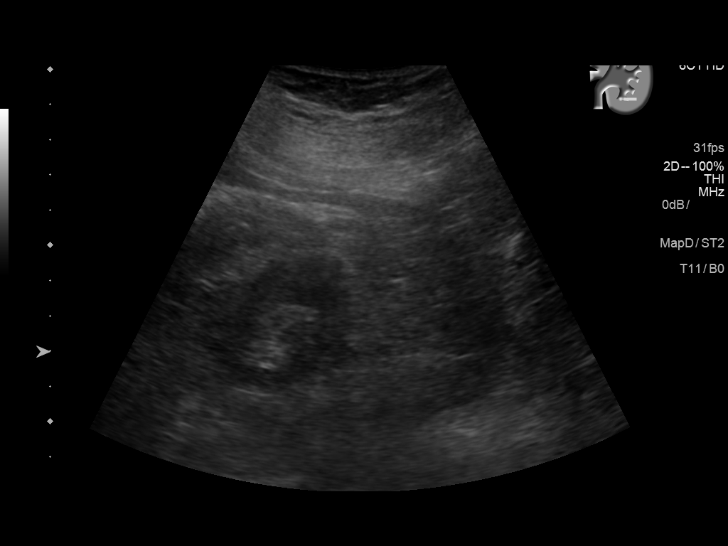
[im 28/31]
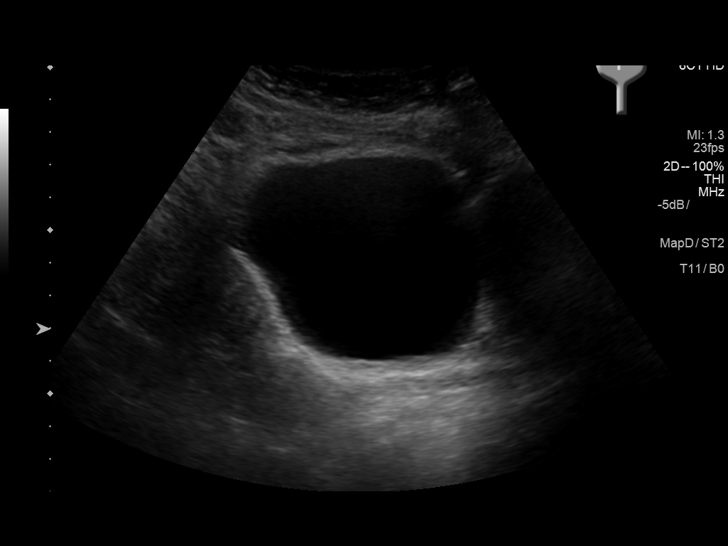
[im 31/31]
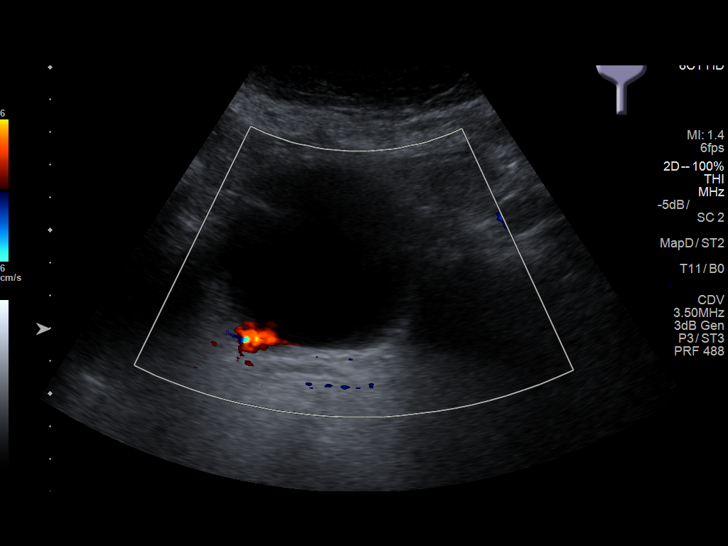

[14 of 25 positions shown; findings below may reference images not displayed]

FINDINGS: Right Kidney:

Renal measurements: 9.4 x 5.1 x 4.2 cm = volume: 105.5 mL .
Echogenicity within normal limits. No mass or hydronephrosis
visualized.

Left Kidney:

Renal measurements: 9.7 x 4.7 x 4.5 cm = volume: 107.2 mL.
Echogenicity within normal limits. No mass or hydronephrosis
visualized.

Bladder:

Appears normal for degree of bladder distention.
IMPRESSION: Normal examination.

## 2020-01-14 DIAGNOSIS — Z23 Encounter for immunization: Secondary | ICD-10-CM | POA: Diagnosis not present

## 2020-01-19 ENCOUNTER — Ambulatory Visit: Payer: Medicare Other

## 2020-01-24 ENCOUNTER — Other Ambulatory Visit: Payer: Self-pay | Admitting: Family Medicine

## 2020-01-24 DIAGNOSIS — F3342 Major depressive disorder, recurrent, in full remission: Secondary | ICD-10-CM

## 2020-02-01 ENCOUNTER — Telehealth: Payer: Self-pay | Admitting: Family Medicine

## 2020-02-01 NOTE — Telephone Encounter (Signed)
Called to schedule Medicare Annual Wellness Visit with Nurse Health Advisor, Kasey Uthus at [facility]. If patient returns call, please schedule AWV with [facility] any date on [facility] schedule (Mon or Wed)  Questions regarding scheduling, please call  [phone] or MS Teams > [email or web address]  Kathryn Brown  Care Guide . Embedded Care Coordination [place]  Care Management [email or web address]  [phone]     

## 2020-02-04 ENCOUNTER — Other Ambulatory Visit: Payer: Self-pay

## 2020-02-04 ENCOUNTER — Ambulatory Visit: Payer: Medicare Other

## 2020-02-04 DIAGNOSIS — Z7901 Long term (current) use of anticoagulants: Secondary | ICD-10-CM | POA: Diagnosis not present

## 2020-02-05 LAB — PROTIME-INR
INR: 2.6 — ABNORMAL HIGH (ref 0.9–1.2)
Prothrombin Time: 26.7 s — ABNORMAL HIGH (ref 9.1–12.0)

## 2020-02-10 ENCOUNTER — Other Ambulatory Visit: Payer: Self-pay

## 2020-02-10 ENCOUNTER — Ambulatory Visit (INDEPENDENT_AMBULATORY_CARE_PROVIDER_SITE_OTHER): Payer: Medicare Other | Admitting: Family Medicine

## 2020-02-10 ENCOUNTER — Encounter: Payer: Self-pay | Admitting: Family Medicine

## 2020-02-10 VITALS — BP 120/78 | HR 60 | Ht 68.0 in | Wt 189.0 lb

## 2020-02-10 DIAGNOSIS — R059 Cough, unspecified: Secondary | ICD-10-CM

## 2020-02-10 DIAGNOSIS — E7801 Familial hypercholesterolemia: Secondary | ICD-10-CM

## 2020-02-10 DIAGNOSIS — R69 Illness, unspecified: Secondary | ICD-10-CM

## 2020-02-10 DIAGNOSIS — R05 Cough: Secondary | ICD-10-CM

## 2020-02-10 DIAGNOSIS — J301 Allergic rhinitis due to pollen: Secondary | ICD-10-CM | POA: Diagnosis not present

## 2020-02-10 DIAGNOSIS — D649 Anemia, unspecified: Secondary | ICD-10-CM | POA: Diagnosis not present

## 2020-02-10 DIAGNOSIS — F3342 Major depressive disorder, recurrent, in full remission: Secondary | ICD-10-CM

## 2020-02-10 DIAGNOSIS — E039 Hypothyroidism, unspecified: Secondary | ICD-10-CM | POA: Diagnosis not present

## 2020-02-10 DIAGNOSIS — D6861 Antiphospholipid syndrome: Secondary | ICD-10-CM | POA: Insufficient documentation

## 2020-02-10 MED ORDER — FERROUS SULFATE 325 (65 FE) MG PO TBEC: 325.0000 mg | DELAYED_RELEASE_TABLET | Freq: Every day | ORAL | 3 refills | Status: DC

## 2020-02-10 MED ORDER — TRAZODONE HCL 50 MG PO TABS: 50.0000 mg | ORAL_TABLET | Freq: Every day | ORAL | 5 refills | Status: DC

## 2020-02-10 MED ORDER — MONTELUKAST SODIUM 10 MG PO TABS: ORAL_TABLET | ORAL | 3 refills | Status: DC

## 2020-02-10 MED ORDER — ATORVASTATIN CALCIUM 10 MG PO TABS: 10.0000 mg | ORAL_TABLET | Freq: Every day | ORAL | 5 refills | Status: DC

## 2020-02-10 MED ORDER — ESCITALOPRAM OXALATE 20 MG PO TABS: 20.0000 mg | ORAL_TABLET | Freq: Every day | ORAL | 5 refills | Status: DC

## 2020-02-10 MED ORDER — LEVOTHYROXINE SODIUM 75 MCG PO TABS: 75.0000 ug | ORAL_TABLET | Freq: Every day | ORAL | 5 refills | Status: DC

## 2020-02-10 NOTE — Progress Notes (Signed)
Date:  02/10/2020   Name:  [name]   DOB:  21-Jul-1951   MRN:  [id number]   Chief Complaint: Anemia, Hyperlipidemia, Hypertension, Hypothyroidism, and Depression  Anemia Presents for follow-up visit. There has been no abdominal pain, anorexia, bruising/bleeding easily, confusion, fever, leg swelling, light-headedness, pallor, paresthesias or weight loss. Signs of blood loss that are not present include hematemesis, hematochezia, melena, menorrhagia and vaginal bleeding. There is no history of hypothyroidism. There are no compliance problems.   Hyperlipidemia This is a chronic problem. The current episode started more than 1 year ago. The problem is controlled. Recent lipid tests were reviewed and are normal. She has no history of diabetes, hypothyroidism, liver disease or nephrotic syndrome. Pertinent negatives include no focal sensory loss, focal weakness, leg pain, myalgias or shortness of breath. Current antihyperlipidemic treatment includes statins. The current treatment provides moderate improvement of lipids. There are no compliance problems.   Depression        This is a chronic problem.  The current episode started more than 1 year ago.   The onset quality is gradual.   The problem occurs intermittently.  The problem has been gradually improving since onset.  Associated symptoms include no decreased concentration, no fatigue, no helplessness, no hopelessness, does not have insomnia, not irritable, no restlessness, no decreased interest, no appetite change, no body aches, no myalgias, no headaches, no indigestion, not sad and no suicidal ideas.  Past treatments include SSRIs - Selective serotonin reuptake inhibitors.  Compliance with treatment is good.  Previous treatment provided moderate relief.   Pertinent negatives include no hypothyroidism.   Lab Results  Component Value Date   CREATININE 1.64 (H) 08/25/2019   BUN 28 (H) 08/25/2019   NA 139 08/25/2019   K 4.2 08/25/2019     CL 102 08/25/2019   CO2 23 08/25/2019   Lab Results  Component Value Date   CHOL 258 (H) 08/25/2019   HDL 59 08/25/2019   LDLCALC 174 (H) 08/25/2019   TRIG 137 08/25/2019   CHOLHDL 3.4 09/09/2017   Lab Results  Component Value Date   TSH 3.190 08/25/2019   No results found for: HGBA1C   Review of Systems  Constitutional: Negative.  Negative for appetite change, chills, fatigue, fever, unexpected weight change and weight loss.  HENT: Negative for congestion, ear discharge, ear pain, rhinorrhea, sinus pressure, sneezing and sore throat.   Eyes: Negative for photophobia, pain, discharge, redness and itching.  Respiratory: Negative for cough, shortness of breath, wheezing and stridor.   Gastrointestinal: Negative for abdominal pain, anorexia, blood in stool, constipation, diarrhea, hematemesis, hematochezia, melena, nausea and vomiting.  Endocrine: Negative for cold intolerance, heat intolerance, polydipsia, polyphagia and polyuria.  Genitourinary: Negative for dysuria, flank pain, frequency, hematuria, menorrhagia, menstrual problem, pelvic pain, urgency, vaginal bleeding and vaginal discharge.  Musculoskeletal: Negative for arthralgias, back pain and myalgias.  Skin: Negative for pallor and rash.  Allergic/Immunologic: Negative for environmental allergies and food allergies.  Neurological: Negative for dizziness, focal weakness, weakness, light-headedness, numbness, headaches and paresthesias.  Hematological: Negative for adenopathy. Does not bruise/bleed easily.  Psychiatric/Behavioral: Positive for depression. Negative for confusion, decreased concentration, dysphoric mood and suicidal ideas. The patient is not nervous/anxious and does not have insomnia.     Patient Active Problem List   Diagnosis Date Noted  . Popliteal bursitis of left knee 08/25/2019  . Familial hypercholesterolemia 08/25/2019  . Renal insufficiency 08/25/2019  . Low hemoglobin 08/25/2019  . History of  essential hypertension  08/25/2019  . Seasonal allergic rhinitis due to pollen 02/24/2019  . Age-related osteoporosis without current pathological fracture 01/20/2018  . Osteoporosis 11/12/2017  . Urinary incontinence 05/28/2017  . Knee pain 05/08/2017  . Muscle weakness 05/08/2017  . Sprain of MCL (medial collateral ligament) of knee 05/08/2017  . Adult hypothyroidism 10/23/2016  . Essential hypertension 10/23/2016  . Gastroesophageal reflux disease without esophagitis 10/23/2016  . Recurrent major depressive disorder, in full remission ([place]) 10/23/2016  . Anticoagulant long-term use 10/23/2016    Allergies  Allergen Reactions  . Penicillin G Rash    Past Surgical History:  Procedure Laterality Date  . CESAREAN SECTION    . CHOLECYSTECTOMY    . COLONOSCOPY  2012   repeat in 10 yrs  . KNEE ARTHROSCOPY W/ MENISCAL REPAIR Left     Social History   Tobacco Use  . Smoking status: Never Smoker  . Smokeless tobacco: Never Used  . Tobacco comment: Smoking cessation materials not required  Substance Use Topics  . Alcohol use: Yes    Alcohol/week: 1.0 standard drinks    Types: 1 Glasses of wine per week    Comment: occasional  . Drug use: No     Medication list has been reviewed and updated.  Current Meds  Medication Sig  . atorvastatin (LIPITOR) 10 MG tablet TAKE 1 TABLET BY MOUTH EVERY DAY  . Calcium Carbonate-Vit D-Min (CALCIUM 1200 PO) Take 1 tablet by mouth daily.  Marland Kitchen escitalopram (LEXAPRO) 20 MG tablet TAKE 1 TABLET BY MOUTH EVERY DAY  . famotidine (PEPCID) 20 MG tablet Take 1 tablet by mouth daily. singh  . ferrous sulfate 325 (65 FE) MG EC tablet Take 1 tablet (325 mg total) by mouth daily with breakfast.  . levothyroxine (SYNTHROID) 75 MCG tablet Take 1 tablet (75 mcg total) by mouth daily.  . montelukast (SINGULAIR) 10 MG tablet TAKE 1 TABLET BY MOUTH EVERYDAY AT BEDTIME  . Multiple Vitamins-Minerals (CENTRUM SILVER 50+WOMEN PO) Take 1 tablet by mouth daily.  .  solifenacin (VESICARE) 5 MG tablet Take 1 tablet by mouth daily. urology  . torsemide (DEMADEX) 5 MG tablet Take 1 tablet by mouth daily. singh  . traZODone (DESYREL) 50 MG tablet TAKE 1 TABLET BY MOUTH EVERY DAY  . vitamin B-12 (CYANOCOBALAMIN) 1000 MCG tablet Take 1,000 mcg by mouth daily.  Marland Kitchen warfarin (COUMADIN) 3 MG tablet Take 1 tablet (3 mg total) by mouth daily.    PHQ 2/9 Scores 02/10/2020 11/03/2019 08/25/2019 06/11/2019  PHQ - 2 Score 0 0 0 0  PHQ- 9 Score 0 0 1 0    BP Readings from Last 3 Encounters:  02/10/20 120/78  11/03/19 130/80  08/25/19 120/76    Physical Exam Vitals and nursing note reviewed.  Constitutional:      General: She is not irritable.    Appearance: She is well-developed.  HENT:     Head: Normocephalic.     Right Ear: External ear normal.     Left Ear: External ear normal.  Eyes:     General: Lids are everted, no foreign bodies appreciated. No scleral icterus.       Left eye: No foreign body or hordeolum.     Conjunctiva/sclera: Conjunctivae normal.     Right eye: Right conjunctiva is not injected.     Left eye: Left conjunctiva is not injected.     Pupils: Pupils are equal, round, and reactive to light.  Neck:     Thyroid: No thyromegaly.  Vascular: No JVD.     Trachea: No tracheal deviation.  Cardiovascular:     Rate and Rhythm: Normal rate and regular rhythm.     Heart sounds: Normal heart sounds. No murmur. No friction rub. No gallop.   Pulmonary:     Effort: Pulmonary effort is normal. No respiratory distress.     Breath sounds: Normal breath sounds. No wheezing or rales.  Abdominal:     General: Bowel sounds are normal.     Palpations: Abdomen is soft. There is no mass.     Tenderness: There is no abdominal tenderness. There is no guarding or rebound.  Musculoskeletal:        General: No tenderness. Normal range of motion.     Cervical back: Normal range of motion and neck supple.  Lymphadenopathy:     Cervical: No cervical  adenopathy.  Skin:    General: Skin is warm.     Findings: No rash.  Neurological:     Mental Status: She is alert and oriented to person, place, and time.     Cranial Nerves: No cranial nerve deficit.     Deep Tendon Reflexes: Reflexes normal.  Psychiatric:        Mood and Affect: Mood is not anxious or depressed.     Wt Readings from Last 3 Encounters:  02/10/20 189 lb (85.7 kg)  11/03/19 184 lb (83.5 kg)  08/25/19 184 lb (83.5 kg)    BP 120/78   Pulse 60   Ht 5\' 8"  (1.727 m)   Wt 189 lb (85.7 kg)   BMI 28.74 kg/m   Assessment and Plan:  1. Familial hypercholesterolemia Chronic.  Controlled.  Stable.  Continue atorvastatin 10 mg once a day.  Will check lipid panel.  We will recheck patient in 6 months - atorvastatin (LIPITOR) 10 MG tablet; Take 1 tablet (10 mg total) by mouth daily.  Dispense: 30 tablet; Refill: 5 - Lipid Panel With LDL/HDL Ratio  2. Recurrent major depressive disorder, in full remission ([place]) Chronic.  Controlled.  Stable.  PHQ 0 gad score 0 continue S-Citalopram 20 mg once a day and trazodone 50 mg nightly for sleep purposes. - escitalopram (LEXAPRO) 20 MG tablet; Take 1 tablet (20 mg total) by mouth daily.  Dispense: 30 tablet; Refill: 5 - traZODone (DESYREL) 50 MG tablet; Take 1 tablet (50 mg total) by mouth daily.  Dispense: 30 tablet; Refill: 5  3. Low hemoglobin Patient history with anemia chronic.  Controlled.  Stable.  Continue iron supplementation 325 mg once a day. - ferrous sulfate 325 (65 FE) MG EC tablet; Take 1 tablet (325 mg total) by mouth daily with breakfast.  Dispense: 30 tablet; Refill: 3  4. Adult hypothyroidism Chronic.  Controlled.  Stable.  Will check thyroid panel with TSH and if in appropriate range we will continue 75 mcg daily. - levothyroxine (SYNTHROID) 75 MCG tablet; Take 1 tablet (75 mcg total) by mouth daily.  Dispense: 30 tablet; Refill: 5 - Thyroid Panel With TSH  5. Cough Patient has occasional cough with may be a  reactive airway for which we will continue on Singulair.  6. Antiphospholipid antibody syndrome (H[facility]) Chronic.  Controlled.  Stable.  Patient is currently on anticoagulation therapy with Coumadin which is taken as directed by her PT/INR levels.  7. Taking medication for chronic disease Patient currently on medications that can affect GFR and will check comprehensive metabolic panel. - Comprehensive Metabolic Panel (CMET)  8. Non-seasonal allergic rhinitis due to pollen  Chronic.  Persistent.  Recurrent.  Continue Singulair 10 mg daily. - montelukast (SINGULAIR) 10 MG tablet; TAKE 1 TABLET BY MOUTH EVERYDAY AT BEDTIME  Dispense: 90 tablet; Refill: 3

## 2020-02-11 LAB — COMPREHENSIVE METABOLIC PANEL
ALT: 16 IU/L (ref 0–32)
AST: 22 IU/L (ref 0–40)
Albumin/Globulin Ratio: 1.6 (ref 1.2–2.2)
Albumin: 4.4 g/dL (ref 3.8–4.8)
Alkaline Phosphatase: 90 IU/L (ref 39–117)
BUN/Creatinine Ratio: 17 (ref 12–28)
BUN: 27 mg/dL (ref 8–27)
Bilirubin Total: 0.6 mg/dL (ref 0.0–1.2)
CO2: 26 mmol/L (ref 20–29)
Calcium: 10 mg/dL (ref 8.7–10.3)
Chloride: 102 mmol/L (ref 96–106)
Creatinine, Ser: 1.59 mg/dL — ABNORMAL HIGH (ref 0.57–1.00)
GFR calc Af Amer: 38 mL/min/{1.73_m2} — ABNORMAL LOW (ref >59)
GFR calc non Af Amer: 33 mL/min/{1.73_m2} — ABNORMAL LOW (ref >59)
Globulin, Total: 2.8 g/dL (ref 1.5–4.5)
Glucose: 98 mg/dL (ref 65–99)
Potassium: 4.2 mmol/L (ref 3.5–5.2)
Sodium: 141 mmol/L (ref 134–144)
Total Protein: 7.2 g/dL (ref 6.0–8.5)

## 2020-02-11 LAB — LIPID PANEL WITH LDL/HDL RATIO
Cholesterol, Total: 196 mg/dL (ref 100–199)
HDL: 64 mg/dL (ref >39)
LDL Chol Calc (NIH): 111 mg/dL — ABNORMAL HIGH (ref 0–99)
LDL/HDL Ratio: 1.7 ratio (ref 0.0–3.2)
Triglycerides: 122 mg/dL (ref 0–149)
VLDL Cholesterol Cal: 21 mg/dL (ref 5–40)

## 2020-02-11 LAB — THYROID PANEL WITH TSH
Free Thyroxine Index: 2 (ref 1.2–4.9)
T3 Uptake Ratio: 28 % (ref 24–39)
T4, Total: 7.2 ug/dL (ref 4.5–12.0)
TSH: 2.08 u[IU]/mL (ref 0.450–4.500)

## 2020-02-22 ENCOUNTER — Ambulatory Visit (INDEPENDENT_AMBULATORY_CARE_PROVIDER_SITE_OTHER): Payer: Medicare Other

## 2020-02-22 VITALS — BP 141/90 | HR 84 | Ht 68.0 in | Wt 185.0 lb

## 2020-02-22 DIAGNOSIS — Z0000 Encounter for general adult medical examination without abnormal findings: Secondary | ICD-10-CM | POA: Diagnosis not present

## 2020-02-22 DIAGNOSIS — Z1231 Encounter for screening mammogram for malignant neoplasm of breast: Secondary | ICD-10-CM

## 2020-02-22 NOTE — Progress Notes (Signed)
Subjective:   [name] is a 69 y.o. female who presents for Medicare Annual (Subsequent) preventive examination.  Virtual Visit via Telephone Note  I connected with [name] on 02/22/20 at 10:40 AM EDT by telephone and verified that I am speaking with the correct person using two identifiers.  Medicare Annual Wellness visit completed telephonically due to Covid-19 pandemic.   Location: Patient: home Provider: office   I discussed the limitations, risks, security and privacy concerns of performing an evaluation and management service by telephone and the availability of in person appointments. The patient expressed understanding and agreed to proceed.  Some vital signs may be absent or patient reported.   Clemetine Marker, LPN    Review of Systems:   Cardiac Risk Factors include: advanced age (>45men, >88 women);hypertension;dyslipidemia     Objective:     Vitals: BP (!) 141/90   Pulse 84   Ht 5\' 8"  (1.727 m)   Wt 185 lb (83.9 kg)   BMI 28.13 kg/m   Body mass index is 28.13 kg/m.  Advanced Directives 02/22/2020 06/15/2019 12/01/2018 11/21/2017 10/15/2017 03/17/2016 11/09/2015  Does Patient Have a Medical Advance Directive? Yes No No Yes No Yes Yes  Type of Paramedic of [place];Living will - - [place];Living will - Living will;Healthcare Power of [place];Living will  Does patient want to make changes to medical advance directive? - - Yes (MAU/Ambulatory/Procedural Areas - Information given) - - - -  Copy of [place] in Chart? No - copy requested - - No - copy requested - - No - copy requested  Would patient like information on creating a medical advance directive? - - - - No - Patient declined - -    Tobacco Social History   Tobacco Use  Smoking Status Never Smoker  Smokeless Tobacco Never Used  Tobacco Comment   none     Counseling given: Not  Answered Comment: none   Clinical Intake:  Pre-visit preparation completed: Yes  Pain : No/denies pain     BMI - recorded: 28.13 Nutritional Status: BMI 25 -29 Overweight Nutritional Risks: None Diabetes: No  How often do you need to have someone help you when you read instructions, pamphlets, or other written materials from your doctor or pharmacy?: 1 - Never  Interpreter Needed?: No  Information entered by :: Clemetine Marker LPN  Past Medical History:  Diagnosis Date  . Arthritis 04/02/1996  . Depression   . GERD (gastroesophageal reflux disease) 04/03/2019  . Hyperlipidemia   . Hypertension   . Renal insufficiency   . Stroke ([place])   . Thyroid disease    Past Surgical History:  Procedure Laterality Date  . CESAREAN SECTION    . CHOLECYSTECTOMY    . COLONOSCOPY  2012   repeat in 10 yrs  . KNEE ARTHROSCOPY W/ MENISCAL REPAIR Left    Family History  Problem Relation Age of Onset  . Stroke Father   . Breast cancer Maternal Grandmother   . Kidney cancer Mother   . Cancer Mother   . Arthritis Brother    Social History   Socioeconomic History  . Marital status: Married    Spouse name: Not on file  . Number of children: 1  . Years of education: Not on file  . Highest education level: Master's degree (e.g., MA, MS, MEng, MEd, MSW, MBA)  Occupational History  . Occupation: Retired  Tobacco Use  . Smoking  status: Never Smoker  . Smokeless tobacco: Never Used  . Tobacco comment: none  Substance and Sexual Activity  . Alcohol use: Yes    Alcohol/week: 1.0 standard drinks    Types: 1 Glasses of wine per week    Comment: occasional drink  . Drug use: No  . Sexual activity: Not Currently    Birth control/protection: Post-menopausal  Other Topics Concern  . Not on file  Social History Narrative  . Not on file   Social Determinants of Health   Financial Resource Strain: Low Risk   . Difficulty of Paying Living Expenses: Not hard at all  Food Insecurity: No  Food Insecurity  . Worried About Charity fundraiser in the Last Year: Never true  . Ran Out of Food in the Last Year: Never true  Transportation Needs: No Transportation Needs  . Lack of Transportation (Medical): No  . Lack of Transportation (Non-Medical): No  Physical Activity: Inactive  . Days of Exercise per Week: 0 days  . Minutes of Exercise per Session: 0 min  Stress: No Stress Concern Present  . Feeling of Stress : Only a little  Social Connections: Slightly Isolated  . Frequency of Communication with Friends and Family: More than three times a week  . Frequency of Social Gatherings with Friends and Family: Once a week  . Attends Religious Services: 1 to 4 times per year  . Active Member of Clubs or Organizations: No  . Attends Archivist Meetings: Never  . Marital Status: Married    Outpatient Encounter Medications as of 02/22/2020  Medication Sig  . atorvastatin (LIPITOR) 10 MG tablet Take 1 tablet (10 mg total) by mouth daily.  Marland Kitchen escitalopram (LEXAPRO) 20 MG tablet Take 1 tablet (20 mg total) by mouth daily.  . famotidine (PEPCID) 20 MG tablet Take 1 tablet by mouth daily. singh  . ferrous sulfate 325 (65 FE) MG EC tablet Take 1 tablet (325 mg total) by mouth daily with breakfast.  . levothyroxine (SYNTHROID) 75 MCG tablet Take 1 tablet (75 mcg total) by mouth daily.  . montelukast (SINGULAIR) 10 MG tablet TAKE 1 TABLET BY MOUTH EVERYDAY AT BEDTIME  . Multiple Vitamins-Minerals (CENTRUM SILVER 50+WOMEN PO) Take 1 tablet by mouth daily.  . solifenacin (VESICARE) 5 MG tablet Take 1 tablet by mouth daily. urology  . torsemide (DEMADEX) 5 MG tablet Take 1 tablet by mouth daily. singh  . traZODone (DESYREL) 50 MG tablet Take 1 tablet (50 mg total) by mouth daily.  . vitamin B-12 (CYANOCOBALAMIN) 1000 MCG tablet Take 1,000 mcg by mouth daily.  Marland Kitchen warfarin (COUMADIN) 3 MG tablet Take 1 tablet (3 mg total) by mouth daily.  . [DISCONTINUED] Calcium Carbonate-Vit D-Min  (CALCIUM 1200 PO) Take 1 tablet by mouth daily.   No facility-administered encounter medications on file as of 02/22/2020.    Activities of Daily Living In your present state of health, do you have any difficulty performing the following activities: 02/22/2020  Hearing? N  Comment declines hearing aids  Vision? N  Difficulty concentrating or making decisions? N  Walking or climbing stairs? N  Dressing or bathing? N  Doing errands, shopping? N  Preparing Food and eating ? N  Using the Toilet? N  In the past six months, have you accidently leaked urine? Y  Comment wears pads for protection  Do you have problems with loss of bowel control? N  Managing your Medications? N  Managing your Finances? N  Housekeeping or managing  your Housekeeping? N  Some recent data might be hidden    Patient Care Team: Juline Patch, MD as PCP - General (Family Medicine)    Assessment:   This is a routine wellness examination for [name].  Exercise Activities and Dietary recommendations Current Exercise Habits: The patient does not participate in regular exercise at present, Exercise limited by: None identified  Goals    . Exercise 150 min/wk Moderate Activity     Recommend to exercise at least 150 minutes per week       Fall Risk Fall Risk  02/22/2020 02/09/2019 12/01/2018 11/21/2017 09/09/2017  Falls in the past year? 0 1 0 Yes No  Comment - - - using weed eater and stumbled into pond -  Number falls in past yr: 0 0 0 1 -  Injury with Fall? 0 - - No -  Risk for fall due to : No Fall Risks - - - -  Follow up Falls prevention discussed - - Education provided;Falls prevention discussed -   FALL RISK PREVENTION PERTAINING TO THE HOME:  Any stairs in or around the home? Yes  If so, do they handrails? Yes   Home free of loose throw rugs in walkways, pet beds, electrical cords, etc? Yes  Adequate lighting in your home to reduce risk of falls? Yes   ASSISTIVE DEVICES UTILIZED TO PREVENT  FALLS:  Life alert? No  Use of a cane, walker or w/c? No  Grab bars in the bathroom? Yes  Shower chair or bench in shower? Yes  Elevated toilet seat or a handicapped toilet? No   DME ORDERS:  DME order needed?  No   TIMED UP AND GO:  Was the test performed? No . Telephonic visit.   Education: Fall risk prevention has been discussed.  Intervention(s) required? No   Depression Screen PHQ 2/9 Scores 02/22/2020 02/10/2020 11/03/2019 08/25/2019  PHQ - 2 Score 0 0 0 0  PHQ- 9 Score - 0 0 1     Cognitive Function - pt declined 6CIT for 2021 AWV; pt has no memory issues.      6CIT Screen 12/01/2018 11/21/2017  What Year? 0 points 0 points  What month? 0 points 0 points  What time? 0 points 0 points  Count back from 20 0 points 0 points  Months in reverse 0 points 0 points  Repeat phrase 0 points 2 points  Total Score 0 2    Immunization History  Administered Date(s) Administered  . Fluad Quad(high Dose 65+) 08/06/2019  . Influenza, High Dose Seasonal PF 08/21/2017  . Influenza,inj,Quad PF,6+ Mos 08/19/2015  . PFIZER SARS-COV-2 Vaccination 12/24/2019, 01/14/2020  . Pneumococcal Conjugate-13 11/12/2016  . Pneumococcal Polysaccharide-23 12/01/2018  . Tdap 06/15/2019    Qualifies for Shingles Vaccine? Yes . Due for Shingrix. Education has been provided regarding the importance of this vaccine. Pt has been advised to call insurance company to determine out of pocket expense. Advised may also receive vaccine at local pharmacy or Health Dept. Verbalized acceptance and understanding.  Tdap: Up to date  Flu Vaccine: Up to date  Pneumococcal Vaccine: Up to date   Screening Tests Health Maintenance  Topic Date Due  . MAMMOGRAM  01/14/2020  . COLONOSCOPY  10/11/2027  . TETANUS/TDAP  06/14/2029  . INFLUENZA VACCINE  Completed  . DEXA SCAN  Completed  . Hepatitis C Screening  Completed  . PNA vac Low Risk Adult  Completed    Cancer Screenings:  Colorectal Screening:  Completed 10/10/17.  Repeat every 10 years;   Mammogram: Completed 01/13/19. Repeat every year. Ordered today. Pt provided with contact information and advised to call to schedule appt.   Bone Density: Completed 10/09/17. Results reflect OSTEOPOROSIS. Repeat every 2 years. Pt declines repeat screening at this time.   Lung Cancer Screening: (Low Dose CT Chest recommended if Age 41-80 years, 30 pack-year currently smoking OR have quit w/in 15years.) does not qualify.   Additional Screening:  Hepatitis C Screening: does qualify; Completed 11/21/17  Vision Screening: Recommended annual ophthalmology exams for early detection of glaucoma and other disorders of the eye. Is the patient up to date with their annual eye exam?  Yes  Who is the provider or what is the name of the office in which the pt attends annual eye exams? [place] Screening: Recommended annual dental exams for proper oral hygiene  Community Resource Referral:  CRR required this visit?  No       Plan:     I have personally reviewed and addressed the Medicare Annual Wellness questionnaire and have noted the following in the patient's chart:  A. Medical and social history B. Use of alcohol, tobacco or illicit drugs  C. Current medications and supplements D. Functional ability and status E.  Nutritional status F.  Physical activity G. Advance directives H. List of other physicians I.  Hospitalizations, surgeries, and ER visits in previous 12 months J.  [place] such as hearing and vision if needed, cognitive and depression L. Referrals and appointments   In addition, I have reviewed and discussed with patient certain preventive protocols, quality metrics, and best practice recommendations. A written personalized care plan for preventive services as well as general preventive health recommendations were provided to patient.   Signed,  Clemetine Marker, LPN Nurse Health Advisor   Nurse  Notes: none

## 2020-02-22 NOTE — Patient Instructions (Signed)
[name] , Thank you for taking time to come for your Medicare Wellness Visit. I appreciate your ongoing commitment to your health goals. Please review the following plan we discussed and let me know if I can assist you in the future.   Screening recommendations/referrals: Colonoscopy: done 10/10/17 Mammogram: done 01/13/19. Please call [phone] to schedule your mammogram.  Bone Density: done 10/09/17 Recommended yearly ophthalmology/optometry visit for glaucoma screening and checkup Recommended yearly dental visit for hygiene and checkup  Vaccinations: Influenza vaccine: done 94/20 Pneumococcal vaccine: done 12/01/18 Tdap vaccine: done 06/15/19 Shingles vaccine: Shingrix discussed. Please contact your pharmacy for coverage information.  Covid-19: done 12/24/19 & 01/14/20  Advanced directives: Please bring a copy of your health care power of attorney and living will to the office at your convenience.  Conditions/risks identified: Recommend increasing physical activity  Next appointment: Please follow up in one year for your Medicare Annual Wellness visit.     Preventive Care 11 Years and Older, Female Preventive care refers to lifestyle choices and visits with your health care provider that can promote health and wellness. What does preventive care include?  A yearly physical exam. This is also called an annual well check.  Dental exams once or twice a year.  Routine eye exams. Ask your health care provider how often you should have your eyes checked.  Personal lifestyle choices, including:  Daily care of your teeth and gums.  Regular physical activity.  Eating a healthy diet.  Avoiding tobacco and drug use.  Limiting alcohol use.  Practicing safe sex.  Taking low-dose aspirin every day.  Taking vitamin and mineral supplements as recommended by your health care provider. What happens during an annual well check? The services and screenings done by your health  care provider during your annual well check will depend on your age, overall health, lifestyle risk factors, and family history of disease. Counseling  Your health care provider may ask you questions about your:  Alcohol use.  Tobacco use.  Drug use.  Emotional well-being.  Home and relationship well-being.  Sexual activity.  Eating habits.  History of falls.  Memory and ability to understand (cognition).  Work and work Statistician.  Reproductive health. Screening  You may have the following tests or measurements:  Height, weight, and BMI.  Blood pressure.  Lipid and cholesterol levels. These may be checked every 5 years, or more frequently if you are over 76 years old.  Skin check.  Lung cancer screening. You may have this screening every year starting at age 75 if you have a 30-pack-year history of smoking and currently smoke or have quit within the past 15 years.  Fecal occult blood test (FOBT) of the stool. You may have this test every year starting at age 67.  Flexible sigmoidoscopy or colonoscopy. You may have a sigmoidoscopy every 5 years or a colonoscopy every 10 years starting at age 47.  Hepatitis C blood test.  Hepatitis B blood test.  Sexually transmitted disease (STD) testing.  Diabetes screening. This is done by checking your blood sugar (glucose) after you have not eaten for a while (fasting). You may have this done every 1-3 years.  Bone density scan. This is done to screen for osteoporosis. You may have this done starting at age 90.  Mammogram. This may be done every 1-2 years. Talk to your health care provider about how often you should have regular mammograms. Talk with your health care provider about your test results, treatment options, and if  necessary, the need for more tests. Vaccines  Your health care provider may recommend certain vaccines, such as:  Influenza vaccine. This is recommended every year.  Tetanus, diphtheria, and  acellular pertussis (Tdap, Td) vaccine. You may need a Td booster every 10 years.  Zoster vaccine. You may need this after age 3.  Pneumococcal 13-valent conjugate (PCV13) vaccine. One dose is recommended after age 41.  Pneumococcal polysaccharide (PPSV23) vaccine. One dose is recommended after age 63. Talk to your health care provider about which screenings and vaccines you need and how often you need them. This information is not intended to replace advice given to you by your health care provider. Make sure you discuss any questions you have with your health care provider. Document Released: 12/16/2015 Document Revised: 08/08/2016 Document Reviewed: 09/20/2015 Elsevier Interactive Patient Education  2017 [place] Prevention in the Home Falls can cause injuries. They can happen to people of all ages. There are many things you can do to make your home safe and to help prevent falls. What can I do on the outside of my home?  Regularly fix the edges of walkways and driveways and fix any cracks.  Remove anything that might make you trip as you walk through a door, such as a raised step or threshold.  Trim any bushes or trees on the path to your home.  Use bright outdoor lighting.  Clear any walking paths of anything that might make someone trip, such as rocks or tools.  Regularly check to see if handrails are loose or broken. Make sure that both sides of any steps have handrails.  Any raised decks and porches should have guardrails on the edges.  Have any leaves, snow, or ice cleared regularly.  Use sand or salt on walking paths during winter.  Clean up any spills in your garage right away. This includes oil or grease spills. What can I do in the bathroom?  Use night lights.  Install grab bars by the toilet and in the tub and shower. Do not use towel bars as grab bars.  Use non-skid mats or decals in the tub or shower.  If you need to sit down in the shower, use  a plastic, non-slip stool.  Keep the floor dry. Clean up any water that spills on the floor as soon as it happens.  Remove soap buildup in the tub or shower regularly.  Attach bath mats securely with double-sided non-slip rug tape.  Do not have throw rugs and other things on the floor that can make you trip. What can I do in the bedroom?  Use night lights.  Make sure that you have a light by your bed that is easy to reach.  Do not use any sheets or blankets that are too big for your bed. They should not hang down onto the floor.  Have a firm chair that has side arms. You can use this for support while you get dressed.  Do not have throw rugs and other things on the floor that can make you trip. What can I do in the kitchen?  Clean up any spills right away.  Avoid walking on wet floors.  Keep items that you use a lot in easy-to-reach places.  If you need to reach something above you, use a strong step stool that has a grab bar.  Keep electrical cords out of the way.  Do not use floor polish or wax that makes floors slippery. If you must  use wax, use non-skid floor wax.  Do not have throw rugs and other things on the floor that can make you trip. What can I do with my stairs?  Do not leave any items on the stairs.  Make sure that there are handrails on both sides of the stairs and use them. Fix handrails that are broken or loose. Make sure that handrails are as long as the stairways.  Check any carpeting to make sure that it is firmly attached to the stairs. Fix any carpet that is loose or worn.  Avoid having throw rugs at the top or bottom of the stairs. If you do have throw rugs, attach them to the floor with carpet tape.  Make sure that you have a light switch at the top of the stairs and the bottom of the stairs. If you do not have them, ask someone to add them for you. What else can I do to help prevent falls?  Wear shoes that:  Do not have high heels.  Have  rubber bottoms.  Are comfortable and fit you well.  Are closed at the toe. Do not wear sandals.  If you use a stepladder:  Make sure that it is fully opened. Do not climb a closed stepladder.  Make sure that both sides of the stepladder are locked into place.  Ask someone to hold it for you, if possible.  Clearly mark and make sure that you can see:  Any grab bars or handrails.  First and last steps.  Where the edge of each step is.  Use tools that help you move around (mobility aids) if they are needed. These include:  Canes.  Walkers.  Scooters.  Crutches.  Turn on the lights when you go into a dark area. Replace any light bulbs as soon as they burn out.  Set up your furniture so you have a clear path. Avoid moving your furniture around.  If any of your floors are uneven, fix them.  If there are any pets around you, be aware of where they are.  Review your medicines with your doctor. Some medicines can make you feel dizzy. This can increase your chance of falling. Ask your doctor what other things that you can do to help prevent falls. This information is not intended to replace advice given to you by your health care provider. Make sure you discuss any questions you have with your health care provider. Document Released: 09/15/2009 Document Revised: 04/26/2016 Document Reviewed: 12/24/2014 Elsevier Interactive Patient Education  2017 [employer].

## 2020-03-08 ENCOUNTER — Other Ambulatory Visit: Payer: Medicare Other

## 2020-03-08 ENCOUNTER — Other Ambulatory Visit: Payer: Self-pay

## 2020-03-08 DIAGNOSIS — M7061 Trochanteric bursitis, right hip: Secondary | ICD-10-CM | POA: Diagnosis not present

## 2020-03-08 DIAGNOSIS — Z7901 Long term (current) use of anticoagulants: Secondary | ICD-10-CM

## 2020-03-09 DIAGNOSIS — Z7901 Long term (current) use of anticoagulants: Secondary | ICD-10-CM | POA: Diagnosis not present

## 2020-03-10 ENCOUNTER — Other Ambulatory Visit: Payer: Self-pay

## 2020-03-10 DIAGNOSIS — Z7901 Long term (current) use of anticoagulants: Secondary | ICD-10-CM

## 2020-03-10 LAB — PROTIME-INR
INR: 3.2 — ABNORMAL HIGH (ref 0.9–1.2)
Prothrombin Time: 32.5 s — ABNORMAL HIGH (ref 9.1–12.0)

## 2020-03-10 MED ORDER — WARFARIN SODIUM 2 MG PO TABS: 2.0000 mg | ORAL_TABLET | Freq: Every day | ORAL | 3 refills | Status: DC

## 2020-03-17 DIAGNOSIS — N1832 Chronic kidney disease, stage 3b: Secondary | ICD-10-CM | POA: Diagnosis not present

## 2020-03-21 DIAGNOSIS — R8290 Unspecified abnormal findings in urine: Secondary | ICD-10-CM | POA: Diagnosis not present

## 2020-03-21 DIAGNOSIS — R600 Localized edema: Secondary | ICD-10-CM | POA: Diagnosis not present

## 2020-03-21 DIAGNOSIS — N1832 Chronic kidney disease, stage 3b: Secondary | ICD-10-CM | POA: Diagnosis not present

## 2020-03-29 ENCOUNTER — Ambulatory Visit: Payer: Medicare Other

## 2020-04-04 ENCOUNTER — Ambulatory Visit: Payer: Medicare Other

## 2020-04-18 ENCOUNTER — Other Ambulatory Visit: Payer: Self-pay

## 2020-04-18 ENCOUNTER — Telehealth: Payer: Self-pay | Admitting: Family Medicine

## 2020-04-18 ENCOUNTER — Other Ambulatory Visit: Payer: Medicare Other

## 2020-04-18 DIAGNOSIS — Z7901 Long term (current) use of anticoagulants: Secondary | ICD-10-CM

## 2020-04-18 NOTE — Telephone Encounter (Unsigned)
Copied from [place] [id number]. Topic: General - Other >> Apr 18, 2020  8:11 AM Celene Kras wrote: Reason for CRM: Pt called stating that she is needing to have her blood work done today if possible. Please advise.

## 2020-04-18 NOTE — Progress Notes (Signed)
Printed off INR

## 2020-04-18 NOTE — Telephone Encounter (Signed)
Please call and tell her ok, put on sched for labs only and hit arrived

## 2020-04-19 LAB — PROTIME-INR
INR: 1.8 — ABNORMAL HIGH (ref 0.9–1.2)
Prothrombin Time: 19 s — ABNORMAL HIGH (ref 9.1–12.0)

## 2020-04-26 ENCOUNTER — Encounter: Payer: Self-pay | Admitting: Family Medicine

## 2020-04-26 ENCOUNTER — Other Ambulatory Visit: Payer: Self-pay

## 2020-04-26 ENCOUNTER — Other Ambulatory Visit: Payer: Self-pay | Admitting: Family Medicine

## 2020-04-26 ENCOUNTER — Ambulatory Visit (INDEPENDENT_AMBULATORY_CARE_PROVIDER_SITE_OTHER): Payer: Medicare Other | Admitting: Family Medicine

## 2020-04-26 VITALS — BP 120/70 | HR 102 | Ht 66.0 in | Wt 186.0 lb

## 2020-04-26 DIAGNOSIS — T148XXA Other injury of unspecified body region, initial encounter: Secondary | ICD-10-CM

## 2020-04-26 DIAGNOSIS — L03115 Cellulitis of right lower limb: Secondary | ICD-10-CM

## 2020-04-26 DIAGNOSIS — Z7901 Long term (current) use of anticoagulants: Secondary | ICD-10-CM

## 2020-04-26 MED ORDER — DOXYCYCLINE HYCLATE 100 MG PO TABS: 100.0000 mg | ORAL_TABLET | Freq: Two times a day (BID) | ORAL | 0 refills | Status: DC

## 2020-04-26 MED ORDER — MUPIROCIN 2 % EX OINT: 1.0000 "application " | TOPICAL_OINTMENT | Freq: Two times a day (BID) | CUTANEOUS | 0 refills | Status: DC

## 2020-04-26 NOTE — Telephone Encounter (Signed)
Requested medications are due for refill today?   Uncertain    Requested medications are on active medication list?  No - Not this strength   Last Refill:   This strength was discontinued by Berton Mount on 12/22/2019 but pharmacy is indicating her last refill of this (1 mg) strength was 01/30/2020.    Future visit scheduled?  Yes  Notes to Clinic:  Please review.  Patient has both 2 mg and 3 mg strengths of Warfarin on her active medication list, but not 1 mg currently.

## 2020-04-26 NOTE — Patient Instructions (Signed)
How to Change Your Wound Dressing A dressing is a material that is placed in and over a wound. A dressing helps your wound heal by protecting it from:  Germs (bacteria).  Another injury.  Getting too dry or too wet. There are several types of wound dressings. Some examples are:  Bandages.  Gauze pads.  Foam pads.  Antimicrobial dressings. These prevent or treat infection and may contain something that kills germs (an antiseptic), such as silver or iodine.  Calcium alginate. These are general guidelines. Follow your doctor's instructions about how to care for your wound. What are the risks? It is usually safe to change your dressing. The sticky (adhesive) tape that is used with a dressing may make your skin sore or irritated, or it may cause a rash. These are the most common problems. However, more serious problems can happen, such as:  Bleeding.  Infection. Supplies needed: Set up a clean area for wound care. You will need:  A trash bag that you can throw away. Have it open and ready to use.  Hand sanitizer.  Cleaning solution as told by your doctor. This may include: ? Germ-free (sterile) water. ? Germ-free salt water (saline). ? Wound cleanser. ? New wound dressing material. Make sure to open the dressing package so the dressing stays on the inside of the package. You may also need the following in your clean area:  A box of vinyl gloves.  Tape.  Adhesive remover.  Skin protectant. This may be a wipe, film, or spray.  Clean or germ-free scissors.  A cotton-tipped applicator. How to change your dressing How often you change your dressing will depend on your wound. Change the dressing as often as told by your doctor. Your doctor may change your dressing, or a family member, friend, or caregiver may be shown how to do it. It is important to:  Wash your hands before and after each dressing change. If you cannot use soap and water, use hand sanitizer.  Wear gloves  and protective clothing while changing a dressing. This may include eye protection.  Never let anyone change your dressing if he or she has an infection, a skin problem, or a skin wound or cut of any size. Preparing to change your dressing  Take a shower before you do the first dressing change of the day. Before you shower, ask your doctor if you should put plastic leak-proof sealing wrap over your dressing to protect it.  If needed, take pain medicine 30 minutes before you change your dressing as told by your doctor. Taking off your old dressing   Wash your hands with soap and water. Dry your hands with a clean towel. If you cannot use soap and water, use hand sanitizer.  Go to the clean area that you have set up with all the supplies you will need.  If you are using gloves, put the gloves on before you take off the dressing.  Gently take off any adhesive or tape by pulling it off in the direction of your hair growth. Only touch the outside edges of the dressing. ? If you are told to use an adhesive remover to loosen the edges of the dressing, make sure to avoid the wound area.  Take off the dressing. If the dressing sticks to your skin, wet the dressing with the cleaner that your doctor says to use. This helps it come off more easily.  Take off any gauze or packing in your wound.  Throw the   old dressing supplies into the trash bag.  Take off your gloves. To take off each glove, grab the cuff with your other hand and turn the glove inside out. Put the gloves in the trash right away.  Wash your hands with soap and water. Dry your hands with a clean towel. If you cannot use soap and water, use hand sanitizer. Cleaning your wound  Follow instructions from your doctor about how to clean your wound. This may include using the cleaner that your doctor recommends. ? You may need to use germ-free water to clean your wound if you are putting on a dressing that has silver in it.  Do not use  over-the-counter medicated or antiseptic creams, sprays, liquids, or dressings unless your doctor tells you to do that.  Use a clean gauze pad to clean the area fully with the cleaner that your doctor recommends.  Throw the gauze pad into the trash bag.  Wash your hands with soap and water. Dry your hands with a clean towel. If you cannot use soap and water, use hand sanitizer. Putting on the dressing  If your doctor recommended a skin protectant, put it on the skin around the wound.  Gently pack the wound if told by your doctor.  Cover the wound with the recommended dressing. Make sure to touch only the outside edges of the dressing. Do not touch the inside of the dressing.  Attach the dressing so all sides stay in place. You may do this with medical adhesive, roll gauze, or tape. If you use tape, do not wrap the tape all the way around your arm or leg.  Take off your gloves. Put them in the trash bag with the old dressing. Tie the bag shut and throw it away.  Wash your hands with soap and water. Dry your hands with a clean towel. If you cannot use soap and water, use hand sanitizer. Follow these instructions at home: Wound care   Check your wound every day for signs of infection, or as often as told by your doctor. Check for: ? More redness, swelling, or pain. ? More fluid or blood. ? Warmth. ? Pus or a bad smell. General instructions  Take over-the-counter and prescription medicines only as told by your doctor.  Ask your doctor if the medicine prescribed to you: ? Requires you to avoid driving or using heavy machinery. ? Can cause trouble pooping (constipation). You may need to take steps to prevent or treat trouble pooping:  Drink enough fluid to keep your pee (urine) pale yellow.  Take over-the-counter or prescription medicines.  Eat foods that are high in fiber. These include beans, whole grains, and fresh fruits and vegetables.  Limit foods that are high in fat and  sugar. These include fried or sweet foods.  Keep all follow-up visits as told by your doctor. This is important. Contact a doctor if:  You have new pain.  You have irritation, a rash, or itching around the wound or dressing.  It hurts to change your dressing.  Changing your dressing causes a lot of bleeding. Get help right away if:  You have very bad pain.  You have signs of infection, such as: ? More redness, swelling, or pain. ? More fluid or blood. ? Warmth. ? Pus or a bad smell. ? Red streaks leading from the wound. ? A fever. Summary  A dressing is a material that is placed in and over a wound.  A dressing helps your wound   heal.  Wash your hands before and after each dressing change.  Follow your doctor's instructions about how to care for your wound.  Check your wound for signs of infection. This information is not intended to replace advice given to you by your health care provider. Make sure you discuss any questions you have with your health care provider. Document Revised: 07/17/2018 Document Reviewed: 07/17/2018 Elsevier Patient Education  2020 [employer]  

## 2020-04-26 NOTE — Progress Notes (Signed)
Date:  04/26/2020   Name:  [name] [facility]   DOB:  [birth year]   MRN:  [id number]   Chief Complaint: Abrasion (bottom of R) leg- scraped it on outside iron chair x 2 weeks ago. putting neosporin and band aide on it- looking worse. )  Rash This is a new (for puncture wound) problem. The problem is unchanged. The affected locations include the right lower leg. The rash is characterized by redness and draining. Associated with: prob puncture wound. Pertinent negatives include no anorexia, congestion, cough, diarrhea, eye pain, facial edema, fatigue, fever, joint pain, nail changes, rhinorrhea, shortness of breath, sore throat or vomiting. Past treatments include antibiotic cream. The treatment provided no relief.    Lab Results  Component Value Date   CREATININE 1.59 (H) 02/10/2020   BUN 27 02/10/2020   NA 141 02/10/2020   K 4.2 02/10/2020   CL 102 02/10/2020   CO2 26 02/10/2020   Lab Results  Component Value Date   CHOL 196 02/10/2020   HDL 64 02/10/2020   LDLCALC 111 (H) 02/10/2020   TRIG 122 02/10/2020   CHOLHDL 3.4 09/09/2017   Lab Results  Component Value Date   TSH 2.080 02/10/2020   No results found for: HGBA1C Lab Results  Component Value Date   WBC 5.6 10/14/2019   HGB 11.9 10/14/2019   HCT 35.8 10/14/2019   MCV 100 (H) 10/14/2019   PLT 291 10/14/2019   Lab Results  Component Value Date   ALT 16 02/10/2020   AST 22 02/10/2020   ALKPHOS 90 02/10/2020   BILITOT 0.6 02/10/2020     Review of Systems  Constitutional: Negative.  Negative for chills, fatigue, fever and unexpected weight change.  HENT: Negative for congestion, ear discharge, ear pain, rhinorrhea, sinus pressure, sneezing and sore throat.   Eyes: Negative for photophobia, pain, discharge, redness and itching.  Respiratory: Negative for cough, shortness of breath, wheezing and stridor.   Gastrointestinal: Negative for abdominal pain, anorexia, blood in stool, constipation, diarrhea, nausea and  vomiting.  Endocrine: Negative for cold intolerance, heat intolerance, polydipsia, polyphagia and polyuria.  Genitourinary: Negative for dysuria, flank pain, frequency, hematuria, menstrual problem, pelvic pain, urgency, vaginal bleeding and vaginal discharge.  Musculoskeletal: Negative for arthralgias, back pain, joint pain and myalgias.  Skin: Negative for nail changes and rash.  Allergic/Immunologic: Negative for environmental allergies and food allergies.  Neurological: Negative for dizziness, weakness, light-headedness, numbness and headaches.  Hematological: Negative for adenopathy. Does not bruise/bleed easily.  Psychiatric/Behavioral: Negative for dysphoric mood. The patient is not nervous/anxious.     Patient Active Problem List   Diagnosis Date Noted  . Antiphospholipid antibody syndrome ([place]) 02/10/2020  . Acute kidney failure ([place]) 11/04/2019  . Edema of lower extremity 11/04/2019  . Stage 3 chronic kidney disease 11/04/2019  . Popliteal bursitis of left knee 08/25/2019  . Familial hypercholesterolemia 08/25/2019  . Renal insufficiency 08/25/2019  . Low hemoglobin 08/25/2019  . History of essential hypertension 08/25/2019  . Seasonal allergic rhinitis due to pollen 02/24/2019  . Age-related osteoporosis without current pathological fracture 01/20/2018  . Osteoporosis 11/12/2017  . Urinary incontinence 05/28/2017  . Knee pain 05/08/2017  . Muscle weakness 05/08/2017  . Sprain of MCL (medial collateral ligament) of knee 05/08/2017  . Adult hypothyroidism 10/23/2016  . Essential hypertension 10/23/2016  . Gastroesophageal reflux disease without esophagitis 10/23/2016  . Recurrent major depressive disorder, in full remission ([place]) 10/23/2016  . Anticoagulant long-term use 10/23/2016    Allergies  Allergen Reactions  . Penicillin G Rash    Past Surgical History:  Procedure Laterality Date  . CESAREAN SECTION    . CHOLECYSTECTOMY    . COLONOSCOPY  2012   repeat in  10 yrs  . KNEE ARTHROSCOPY W/ MENISCAL REPAIR Left     Social History   Tobacco Use  . Smoking status: Never Smoker  . Smokeless tobacco: Never Used  . Tobacco comment: none  Substance Use Topics  . Alcohol use: Yes    Alcohol/week: 1.0 standard drinks    Types: 1 Glasses of wine per week    Comment: occasional drink  . Drug use: No     Medication list has been reviewed and updated.  Current Meds  Medication Sig  . atorvastatin (LIPITOR) 10 MG tablet Take 1 tablet (10 mg total) by mouth daily.  Marland Kitchen escitalopram (LEXAPRO) 20 MG tablet Take 1 tablet (20 mg total) by mouth daily.  . famotidine (PEPCID) 20 MG tablet Take 1 tablet by mouth daily. singh  . ferrous sulfate 325 (65 FE) MG EC tablet Take 1 tablet (325 mg total) by mouth daily with breakfast.  . levothyroxine (SYNTHROID) 75 MCG tablet Take 1 tablet (75 mcg total) by mouth daily.  . montelukast (SINGULAIR) 10 MG tablet TAKE 1 TABLET BY MOUTH EVERYDAY AT BEDTIME  . Multiple Vitamins-Minerals (CENTRUM SILVER 50+WOMEN PO) Take 1 tablet by mouth daily.  . solifenacin (VESICARE) 5 MG tablet Take 1 tablet by mouth daily. urology  . torsemide (DEMADEX) 5 MG tablet Take 1 tablet by mouth daily. singh  . traZODone (DESYREL) 50 MG tablet Take 1 tablet (50 mg total) by mouth daily.  . vitamin B-12 (CYANOCOBALAMIN) 1000 MCG tablet Take 1,000 mcg by mouth daily.  Marland Kitchen warfarin (COUMADIN) 1 MG tablet TAKE 1 TABLET BY MOUTH EVERY DAY  . warfarin (COUMADIN) 2 MG tablet Take 1 tablet (2 mg total) by mouth daily.  Marland Kitchen warfarin (COUMADIN) 3 MG tablet Take 1 tablet (3 mg total) by mouth daily.    PHQ 2/9 Scores 04/26/2020 02/22/2020 02/10/2020 11/03/2019  PHQ - 2 Score 0 0 0 0  PHQ- 9 Score 2 - 0 0    BP Readings from Last 3 Encounters:  04/26/20 120/70  02/22/20 (!) 141/90  02/10/20 120/78    Physical Exam Vitals reviewed.  Constitutional:      Appearance: She is well-developed.  HENT:     Head: Normocephalic.     Right Ear: External  ear normal.     Left Ear: External ear normal.  Eyes:     General: Lids are everted, no foreign bodies appreciated. No scleral icterus.       Left eye: No foreign body or hordeolum.     Conjunctiva/sclera: Conjunctivae normal.     Right eye: Right conjunctiva is not injected.     Left eye: Left conjunctiva is not injected.     Pupils: Pupils are equal, round, and reactive to light.  Neck:     Thyroid: No thyromegaly.     Vascular: No JVD.     Trachea: No tracheal deviation.  Cardiovascular:     Rate and Rhythm: Normal rate and regular rhythm.     Heart sounds: Normal heart sounds. No murmur. No friction rub. No gallop.   Pulmonary:     Effort: Pulmonary effort is normal. No respiratory distress.     Breath sounds: Normal breath sounds. No wheezing or rales.  Abdominal:     General: Bowel sounds are  normal.     Palpations: Abdomen is soft. There is no mass.     Tenderness: There is no abdominal tenderness. There is no guarding or rebound.  Musculoskeletal:        General: No tenderness. Normal range of motion.     Cervical back: Normal range of motion and neck supple.  Lymphadenopathy:     Cervical: No cervical adenopathy.  Skin:    General: Skin is warm.     Coloration: Skin is not jaundiced or mottled.     Findings: Erythema, lesion and wound present. No acne or rash.          Comments: Ulcerative/wound  Neurological:     Mental Status: She is alert and oriented to person, place, and time.     Cranial Nerves: No cranial nerve deficit.     Deep Tendon Reflexes: Reflexes normal.  Psychiatric:        Mood and Affect: Mood is not anxious or depressed.     Wt Readings from Last 3 Encounters:  04/26/20 186 lb (84.4 kg)  02/22/20 185 lb (83.9 kg)  02/10/20 189 lb (85.7 kg)    BP 120/70   Pulse (!) 102   Ht 5\' 6"  (1.676 m)   Wt 186 lb (84.4 kg)   BMI 30.02 kg/m   Assessment and Plan:  1. Cellulitis of right lower extremity Patient has an ulcerative wound with  surrounding cellulitis that necrotic debris was removed and dressing was applied after cleaning with antibacterial solution application of triple antibiotic ointment and dressing with nonstick Telfa roll gauze and gauze netting.  We will initiate Bactroban ointment twice a day after the area is cleaned and doxycycline 100 mg twice a day will recheck patient in 1 week - mupirocin ointment (BACTROBAN) 2 %; Place 1 application into the nose 2 (two) times daily.  Dispense: 22 g; Refill: 0 - doxycycline (VIBRA-TABS) 100 MG tablet; Take 1 tablet (100 mg total) by mouth 2 (two) times daily.  Dispense: 20 tablet; Refill: 0  2. Puncture wound Suspect this is a partial puncture wound or an avulsion of the skin with delayed closure.  There is a secondary infection that we will initiate wound care.  Tetanus toxoid up-to-date. - mupirocin ointment (BACTROBAN) 2 %; Place 1 application into the nose 2 (two) times daily.  Dispense: 22 g; Refill: 0 - doxycycline (VIBRA-TABS) 100 MG tablet; Take 1 tablet (100 mg total) by mouth 2 (two) times daily.  Dispense: 20 tablet; Refill: 0

## 2020-04-27 NOTE — Addendum Note (Signed)
Addended by: Juline Patch on: 04/27/2020 06:33 AM   Modules accepted: Level of Service

## 2020-05-03 ENCOUNTER — Encounter: Payer: Self-pay | Admitting: Family Medicine

## 2020-05-03 ENCOUNTER — Ambulatory Visit (INDEPENDENT_AMBULATORY_CARE_PROVIDER_SITE_OTHER): Payer: Medicare Other | Admitting: Family Medicine

## 2020-05-03 ENCOUNTER — Ambulatory Visit
Admission: RE | Admit: 2020-05-03 | Discharge: 2020-05-03 | Disposition: A | Discharge status: Home / Self Care | Payer: Medicare Other | Source: Ambulatory Visit | Attending: Family Medicine | Admitting: Family Medicine

## 2020-05-03 ENCOUNTER — Other Ambulatory Visit: Payer: Self-pay

## 2020-05-03 VITALS — BP 120/82 | HR 72 | Ht 66.0 in | Wt 188.0 lb

## 2020-05-03 DIAGNOSIS — Z7901 Long term (current) use of anticoagulants: Secondary | ICD-10-CM

## 2020-05-03 DIAGNOSIS — Z1231 Encounter for screening mammogram for malignant neoplasm of breast: Secondary | ICD-10-CM | POA: Diagnosis not present

## 2020-05-03 IMAGING — MG DIGITAL SCREENING BILAT W/ TOMO W/ CAD
8 series · 8 of 24 positions shown · non-contrast
Comparison: Previous exam(s).

CLINICAL DATA: Screening.

EXAM:
DIGITAL SCREENING BILATERAL MAMMOGRAM WITH TOMO AND CAD

[L MLO synth-2D]
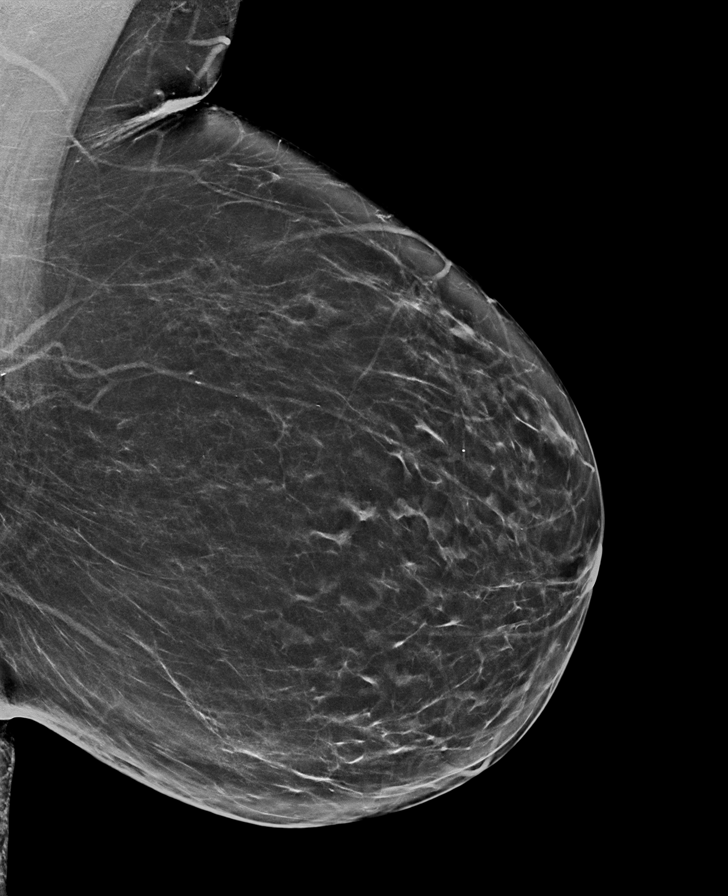

[L CC synth-2D]
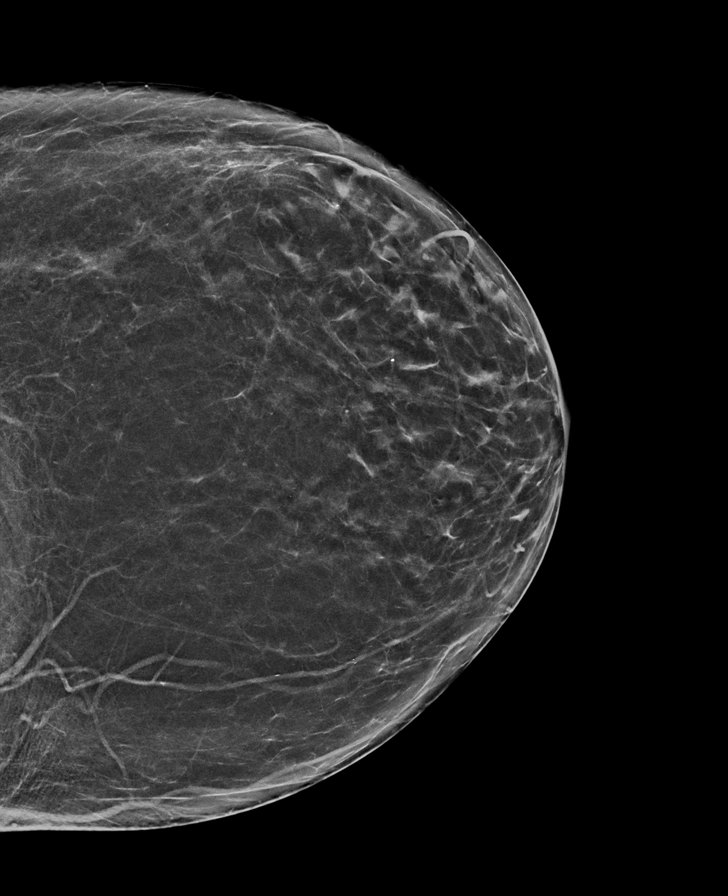

[R MLO synth-2D]
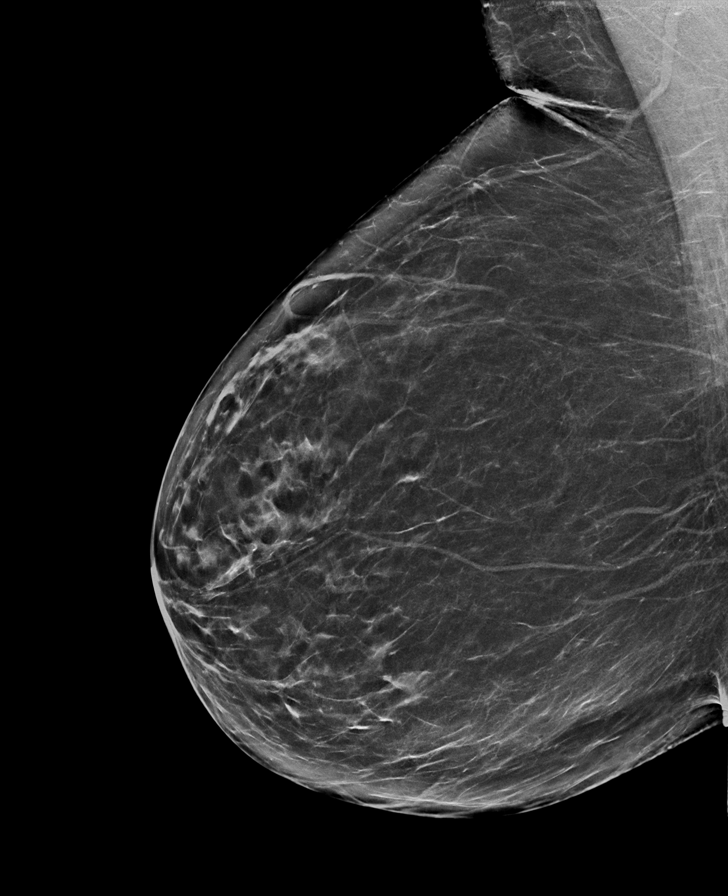

[R CC synth-2D]
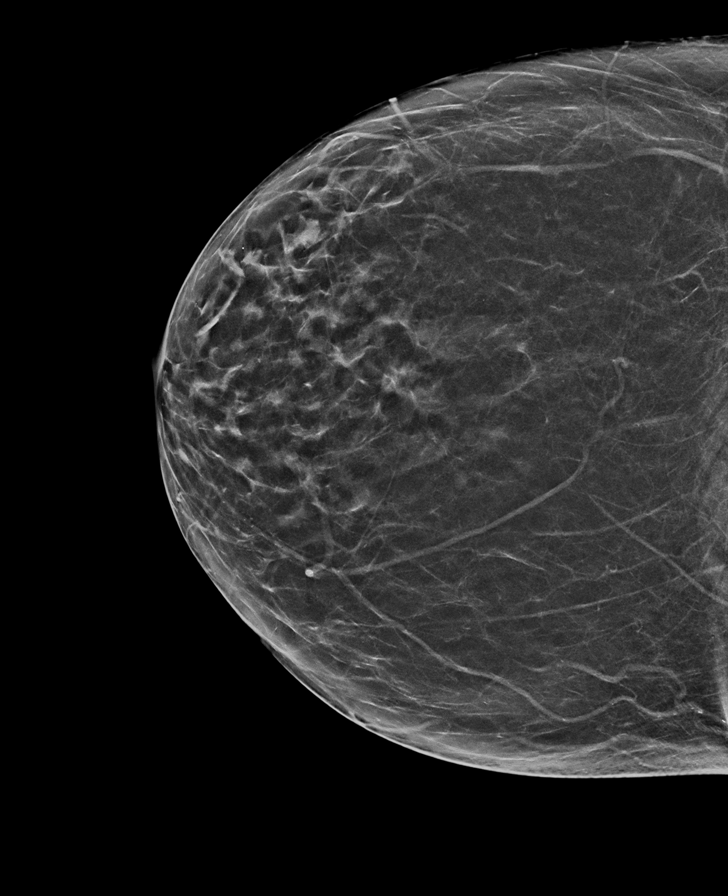

[R CC tomo · tomo slice 33/66.0]
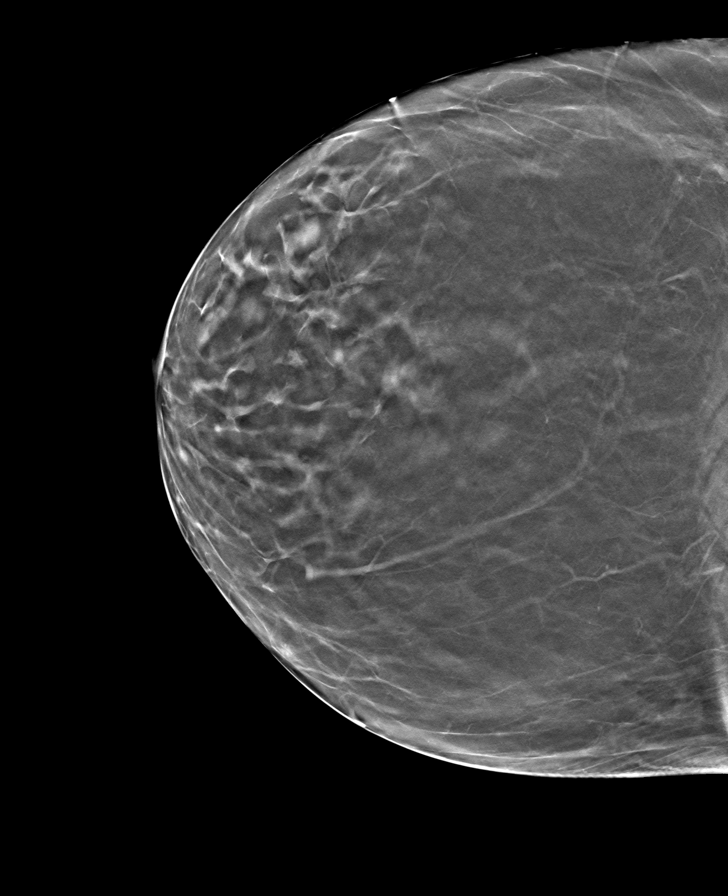

[R MLO tomo · tomo slice 38/75.0]
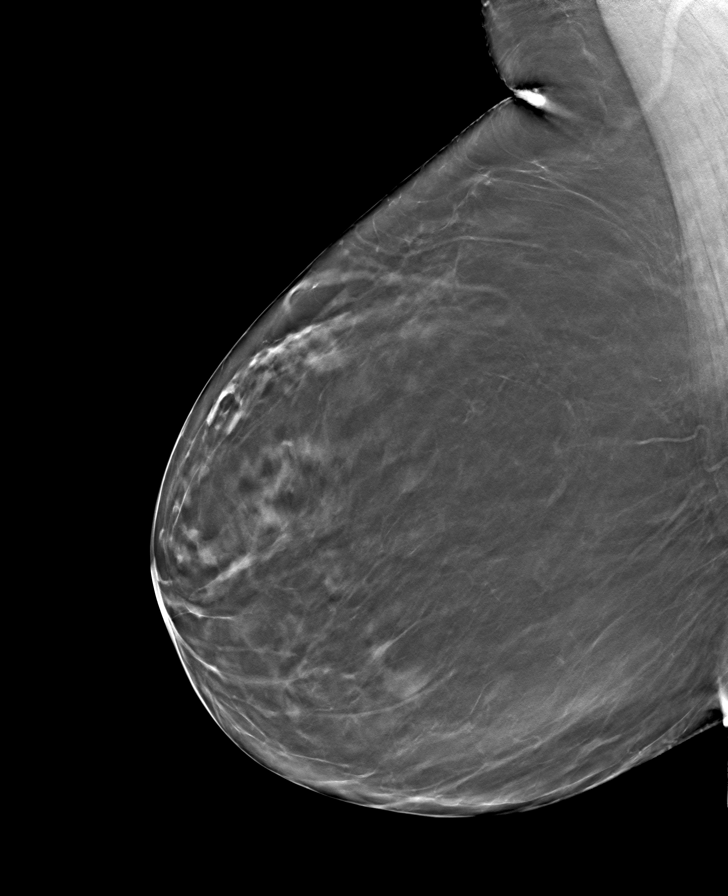

[L MLO tomo · tomo slice 37/73.0]
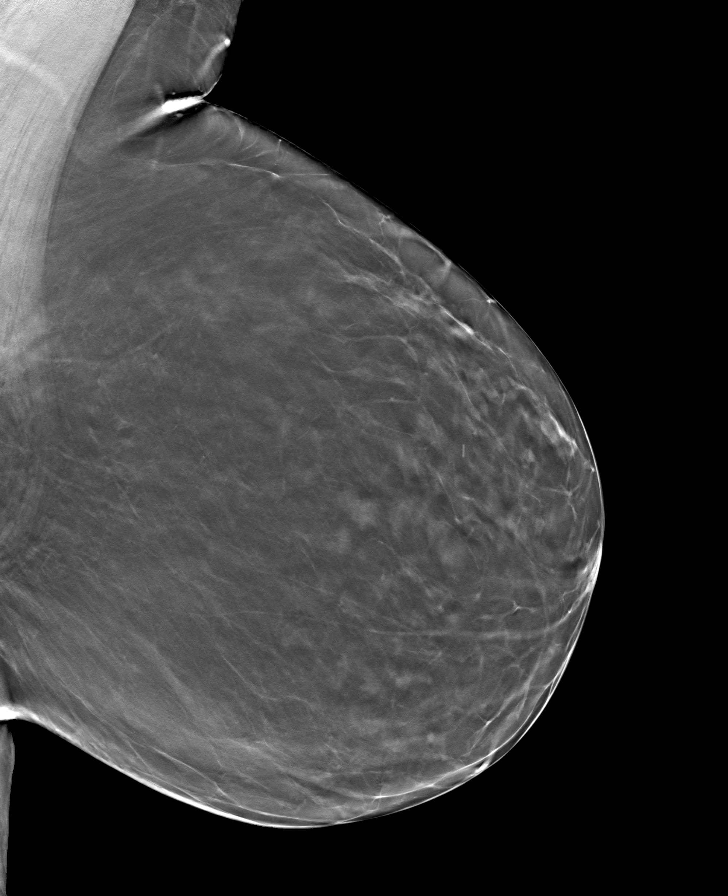

[L CC tomo · tomo slice 33/66.0]
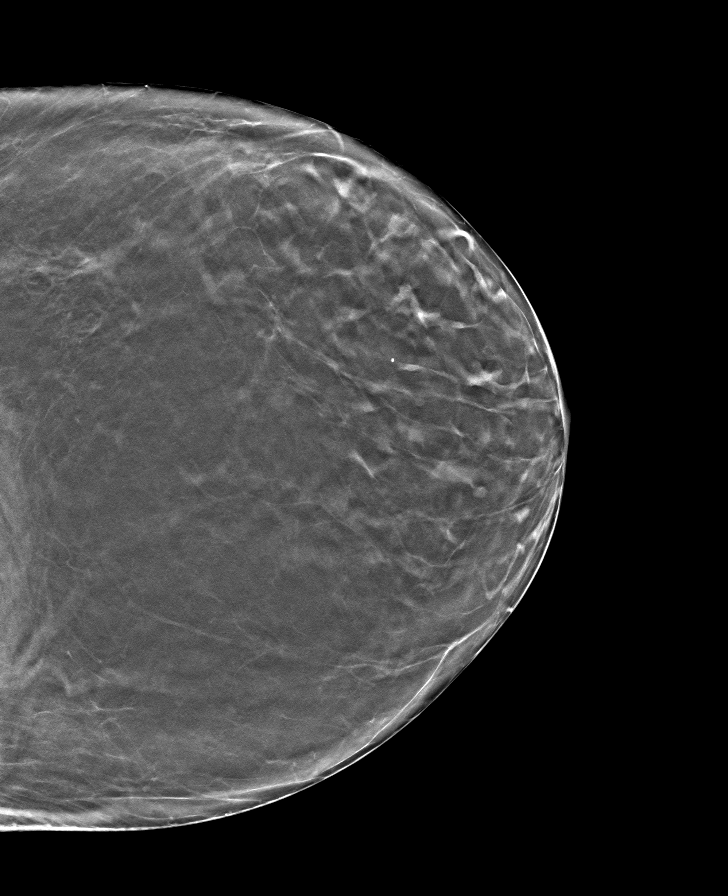

[8 of 24 positions shown; findings below may reference images not displayed]

ACR Breast Density Category b: There are scattered areas of
fibroglandular density.
FINDINGS: There are no findings suspicious for malignancy. Images were
processed with CAD.
IMPRESSION: No mammographic evidence of malignancy. A result letter of this
screening mammogram will be mailed directly to the patient.

RECOMMENDATION:
Screening mammogram in one year. (Code:[TQ])

BI-RADS CATEGORY  1: Negative.

## 2020-05-03 NOTE — Progress Notes (Signed)
Date:  05/03/2020   Name:  [name] [facility]   DOB:  1951-10-19   MRN:  [id number]   Chief Complaint: Follow-up (cellulitis on R) leg) and anticoag therapy (recheck iNR)  Patient is a 69 year old female who presents for a wound evaluation exam. The patient reports the following problems:leg wound healing . Health maintenance has been reviewed up to date.   Lab Results  Component Value Date   CREATININE 1.59 (H) 02/10/2020   BUN 27 02/10/2020   NA 141 02/10/2020   K 4.2 02/10/2020   CL 102 02/10/2020   CO2 26 02/10/2020   Lab Results  Component Value Date   CHOL 196 02/10/2020   HDL 64 02/10/2020   LDLCALC 111 (H) 02/10/2020   TRIG 122 02/10/2020   CHOLHDL 3.4 09/09/2017   Lab Results  Component Value Date   TSH 2.080 02/10/2020   No results found for: HGBA1C Lab Results  Component Value Date   WBC 5.6 10/14/2019   HGB 11.9 10/14/2019   HCT 35.8 10/14/2019   MCV 100 (H) 10/14/2019   PLT 291 10/14/2019   Lab Results  Component Value Date   ALT 16 02/10/2020   AST 22 02/10/2020   ALKPHOS 90 02/10/2020   BILITOT 0.6 02/10/2020     Review of Systems  Constitutional: Negative.  Negative for chills, fatigue, fever and unexpected weight change.  HENT: Negative for congestion, ear discharge, ear pain, rhinorrhea, sinus pressure, sneezing and sore throat.   Eyes: Negative for photophobia, pain, discharge, redness and itching.  Respiratory: Negative for cough, shortness of breath, wheezing and stridor.   Gastrointestinal: Negative for abdominal pain, blood in stool, constipation, diarrhea, nausea and vomiting.  Endocrine: Negative for cold intolerance, heat intolerance, polydipsia, polyphagia and polyuria.  Genitourinary: Negative for dysuria, flank pain, frequency, hematuria, menstrual problem, pelvic pain, urgency, vaginal bleeding and vaginal discharge.  Musculoskeletal: Negative for arthralgias, back pain and myalgias.  Skin: Negative for rash.    Allergic/Immunologic: Negative for environmental allergies and food allergies.  Neurological: Negative for dizziness, weakness, light-headedness, numbness and headaches.  Hematological: Negative for adenopathy. Does not bruise/bleed easily.  Psychiatric/Behavioral: Negative for dysphoric mood. The patient is not nervous/anxious.     Patient Active Problem List   Diagnosis Date Noted  . Antiphospholipid antibody syndrome ([place]) 02/10/2020  . Acute kidney failure ([place]) 11/04/2019  . Edema of lower extremity 11/04/2019  . Stage 3 chronic kidney disease 11/04/2019  . Popliteal bursitis of left knee 08/25/2019  . Familial hypercholesterolemia 08/25/2019  . Renal insufficiency 08/25/2019  . Low hemoglobin 08/25/2019  . History of essential hypertension 08/25/2019  . Seasonal allergic rhinitis due to pollen 02/24/2019  . Age-related osteoporosis without current pathological fracture 01/20/2018  . Osteoporosis 11/12/2017  . Urinary incontinence 05/28/2017  . Knee pain 05/08/2017  . Muscle weakness 05/08/2017  . Sprain of MCL (medial collateral ligament) of knee 05/08/2017  . Adult hypothyroidism 10/23/2016  . Essential hypertension 10/23/2016  . Gastroesophageal reflux disease without esophagitis 10/23/2016  . Recurrent major depressive disorder, in full remission ([place]) 10/23/2016  . Anticoagulant long-term use 10/23/2016    Allergies  Allergen Reactions  . Penicillin G Rash    Past Surgical History:  Procedure Laterality Date  . CESAREAN SECTION    . CHOLECYSTECTOMY    . COLONOSCOPY  2012   repeat in 10 yrs  . KNEE ARTHROSCOPY W/ MENISCAL REPAIR Left     Social History   Tobacco Use  . Smoking  status: Never Smoker  . Smokeless tobacco: Never Used  . Tobacco comment: none  Substance Use Topics  . Alcohol use: Yes    Alcohol/week: 1.0 standard drinks    Types: 1 Glasses of wine per week    Comment: occasional drink  . Drug use: No     Medication list has been  reviewed and updated.  Current Meds  Medication Sig  . atorvastatin (LIPITOR) 10 MG tablet Take 1 tablet (10 mg total) by mouth daily.  Marland Kitchen doxycycline (VIBRA-TABS) 100 MG tablet Take 1 tablet (100 mg total) by mouth 2 (two) times daily.  Marland Kitchen escitalopram (LEXAPRO) 20 MG tablet Take 1 tablet (20 mg total) by mouth daily.  . famotidine (PEPCID) 20 MG tablet Take 1 tablet by mouth daily. singh  . ferrous sulfate 325 (65 FE) MG EC tablet Take 1 tablet (325 mg total) by mouth daily with breakfast.  . levothyroxine (SYNTHROID) 75 MCG tablet Take 1 tablet (75 mcg total) by mouth daily.  . montelukast (SINGULAIR) 10 MG tablet TAKE 1 TABLET BY MOUTH EVERYDAY AT BEDTIME  . Multiple Vitamins-Minerals (CENTRUM SILVER 50+WOMEN PO) Take 1 tablet by mouth daily.  . mupirocin ointment (BACTROBAN) 2 % Place 1 application into the nose 2 (two) times daily.  . solifenacin (VESICARE) 5 MG tablet Take 1 tablet by mouth daily. urology  . traZODone (DESYREL) 50 MG tablet Take 1 tablet (50 mg total) by mouth daily.  . vitamin B-12 (CYANOCOBALAMIN) 1000 MCG tablet Take 1,000 mcg by mouth daily.  Marland Kitchen warfarin (COUMADIN) 1 MG tablet TAKE 1 TABLET BY MOUTH EVERY DAY  . warfarin (COUMADIN) 2 MG tablet Take 1 tablet (2 mg total) by mouth daily.  Marland Kitchen warfarin (COUMADIN) 3 MG tablet Take 1 tablet (3 mg total) by mouth daily.    PHQ 2/9 Scores 04/26/2020 02/22/2020 02/10/2020 11/03/2019  PHQ - 2 Score 0 0 0 0  PHQ- 9 Score 2 - 0 0    BP Readings from Last 3 Encounters:  05/03/20 120/82  04/26/20 120/70  02/22/20 (!) 141/90    Physical Exam Vitals and nursing note reviewed.  Constitutional:      Appearance: She is well-developed.  HENT:     Head: Normocephalic.     Right Ear: External ear normal.     Left Ear: External ear normal.  Eyes:     General: Lids are everted, no foreign bodies appreciated. No scleral icterus.       Left eye: No foreign body or hordeolum.     Conjunctiva/sclera: Conjunctivae normal.     Right  eye: Right conjunctiva is not injected.     Left eye: Left conjunctiva is not injected.     Pupils: Pupils are equal, round, and reactive to light.  Neck:     Thyroid: No thyromegaly.     Vascular: No JVD.     Trachea: No tracheal deviation.  Cardiovascular:     Rate and Rhythm: Normal rate and regular rhythm.     Heart sounds: Normal heart sounds, S1 normal and S2 normal. No murmur. No systolic murmur. No diastolic murmur. No friction rub. No gallop. No S3 or S4 sounds.   Pulmonary:     Effort: Pulmonary effort is normal. No respiratory distress.     Breath sounds: Normal breath sounds. No wheezing, rhonchi or rales.  Abdominal:     General: Bowel sounds are normal.     Palpations: Abdomen is soft. There is no mass.     Tenderness:  There is no abdominal tenderness. There is no guarding or rebound.  Musculoskeletal:        General: No tenderness. Normal range of motion.     Cervical back: Normal range of motion and neck supple.     Right lower leg: No edema.     Left lower leg: No edema.  Lymphadenopathy:     Cervical: No cervical adenopathy.  Skin:    General: Skin is warm.     Findings: Erythema present. No rash.     Comments: Gradually granulating in  Neurological:     Mental Status: She is alert and oriented to person, place, and time.     Cranial Nerves: No cranial nerve deficit.     Deep Tendon Reflexes: Reflexes normal.  Psychiatric:        Mood and Affect: Mood is not anxious or depressed.     Wt Readings from Last 3 Encounters:  05/03/20 188 lb (85.3 kg)  04/26/20 186 lb (84.4 kg)  02/22/20 185 lb (83.9 kg)    BP 120/82   Pulse 72   Ht 5\' 6"  (1.676 m)   Wt 188 lb (85.3 kg)   BMI 30.34 kg/m   Assessment and Plan:  1. Anticoagulant long-term use Chronic.  Controlled.  Stable.  Will obtain PT/INR for evaluation of current Coumadin therapy. - INR/PT  2.  Cellulitis with healing ulceration due to puncture wound.  Area was cleaned with Betadine and eschar  was removed with sterile forcep hemostat.  Area was recleaned and dressed with triple antibiotic and patient will continue to dress twice a day after cleaning area with Q-tip and antibacterial wash and application of Bactroban.

## 2020-05-04 LAB — PROTIME-INR
INR: 1.8 — ABNORMAL HIGH (ref 0.9–1.2)
Prothrombin Time: 19.3 s — ABNORMAL HIGH (ref 9.1–12.0)

## 2020-05-18 ENCOUNTER — Other Ambulatory Visit: Payer: Self-pay | Admitting: Family Medicine

## 2020-05-18 DIAGNOSIS — F3342 Major depressive disorder, recurrent, in full remission: Secondary | ICD-10-CM

## 2020-05-23 ENCOUNTER — Ambulatory Visit
Admission: RE | Admit: 2020-05-23 | Discharge: 2020-05-23 | Disposition: A | Discharge status: Home / Self Care | Payer: Medicare Other | Source: Ambulatory Visit | Attending: Family Medicine | Admitting: Family Medicine

## 2020-05-23 ENCOUNTER — Encounter: Payer: Self-pay | Admitting: Family Medicine

## 2020-05-23 ENCOUNTER — Other Ambulatory Visit: Payer: Self-pay

## 2020-05-23 ENCOUNTER — Ambulatory Visit
Admission: RE | Admit: 2020-05-23 | Discharge: 2020-05-23 | Disposition: A | Discharge status: Home / Self Care | Payer: Medicare Other | Attending: Family Medicine | Admitting: Family Medicine

## 2020-05-23 ENCOUNTER — Ambulatory Visit (INDEPENDENT_AMBULATORY_CARE_PROVIDER_SITE_OTHER): Payer: Medicare Other | Admitting: Family Medicine

## 2020-05-23 ENCOUNTER — Other Ambulatory Visit: Payer: Medicare Other

## 2020-05-23 VITALS — BP 130/80 | HR 80 | Ht 68.0 in | Wt 186.0 lb

## 2020-05-23 DIAGNOSIS — T148XXA Other injury of unspecified body region, initial encounter: Secondary | ICD-10-CM | POA: Insufficient documentation

## 2020-05-23 DIAGNOSIS — L03115 Cellulitis of right lower limb: Secondary | ICD-10-CM

## 2020-05-23 DIAGNOSIS — S81801A Unspecified open wound, right lower leg, initial encounter: Secondary | ICD-10-CM | POA: Diagnosis not present

## 2020-05-23 DIAGNOSIS — Z7901 Long term (current) use of anticoagulants: Secondary | ICD-10-CM | POA: Diagnosis not present

## 2020-05-23 IMAGING — CR DG TIBIA/FIBULA 2V*R*
2 series · 2 of 2 positions shown · non-contrast
Comparison: None.

CLINICAL DATA: Right lower leg wound.

EXAM:
RIGHT TIBIA AND FIBULA - 2 VIEW

[tibia ap]
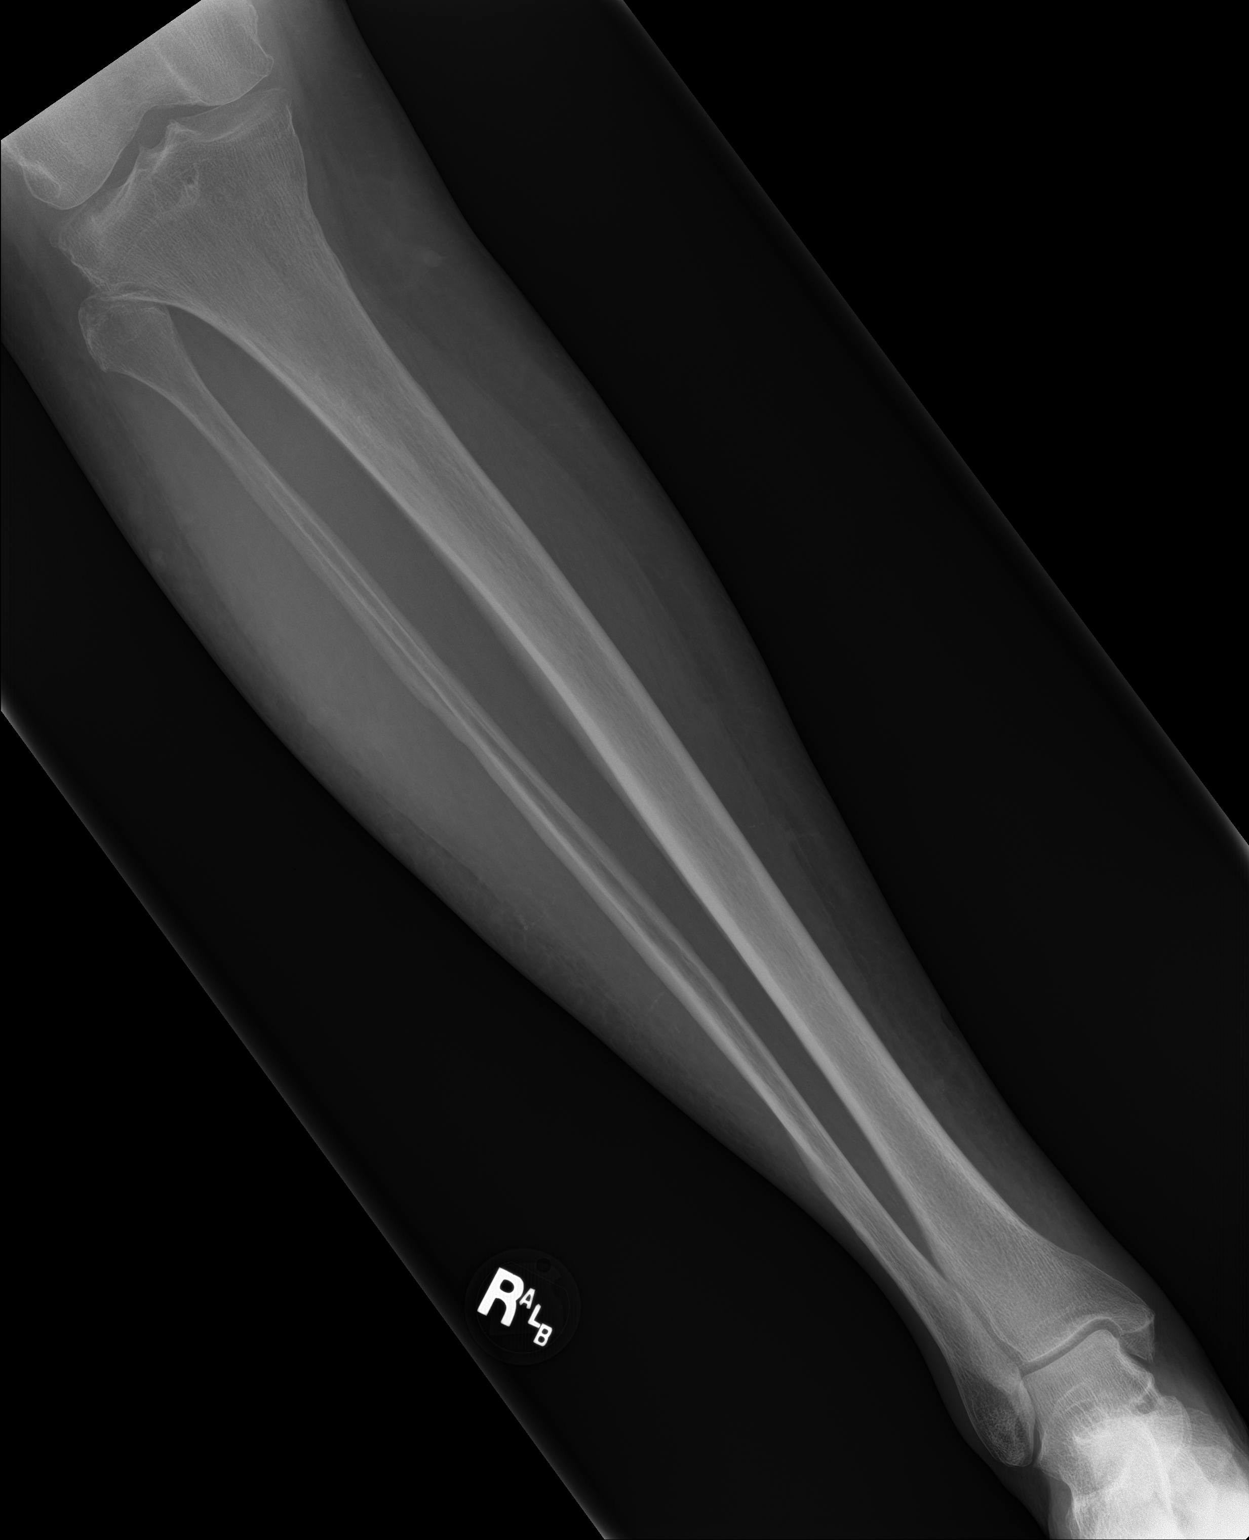

[tibia lat]
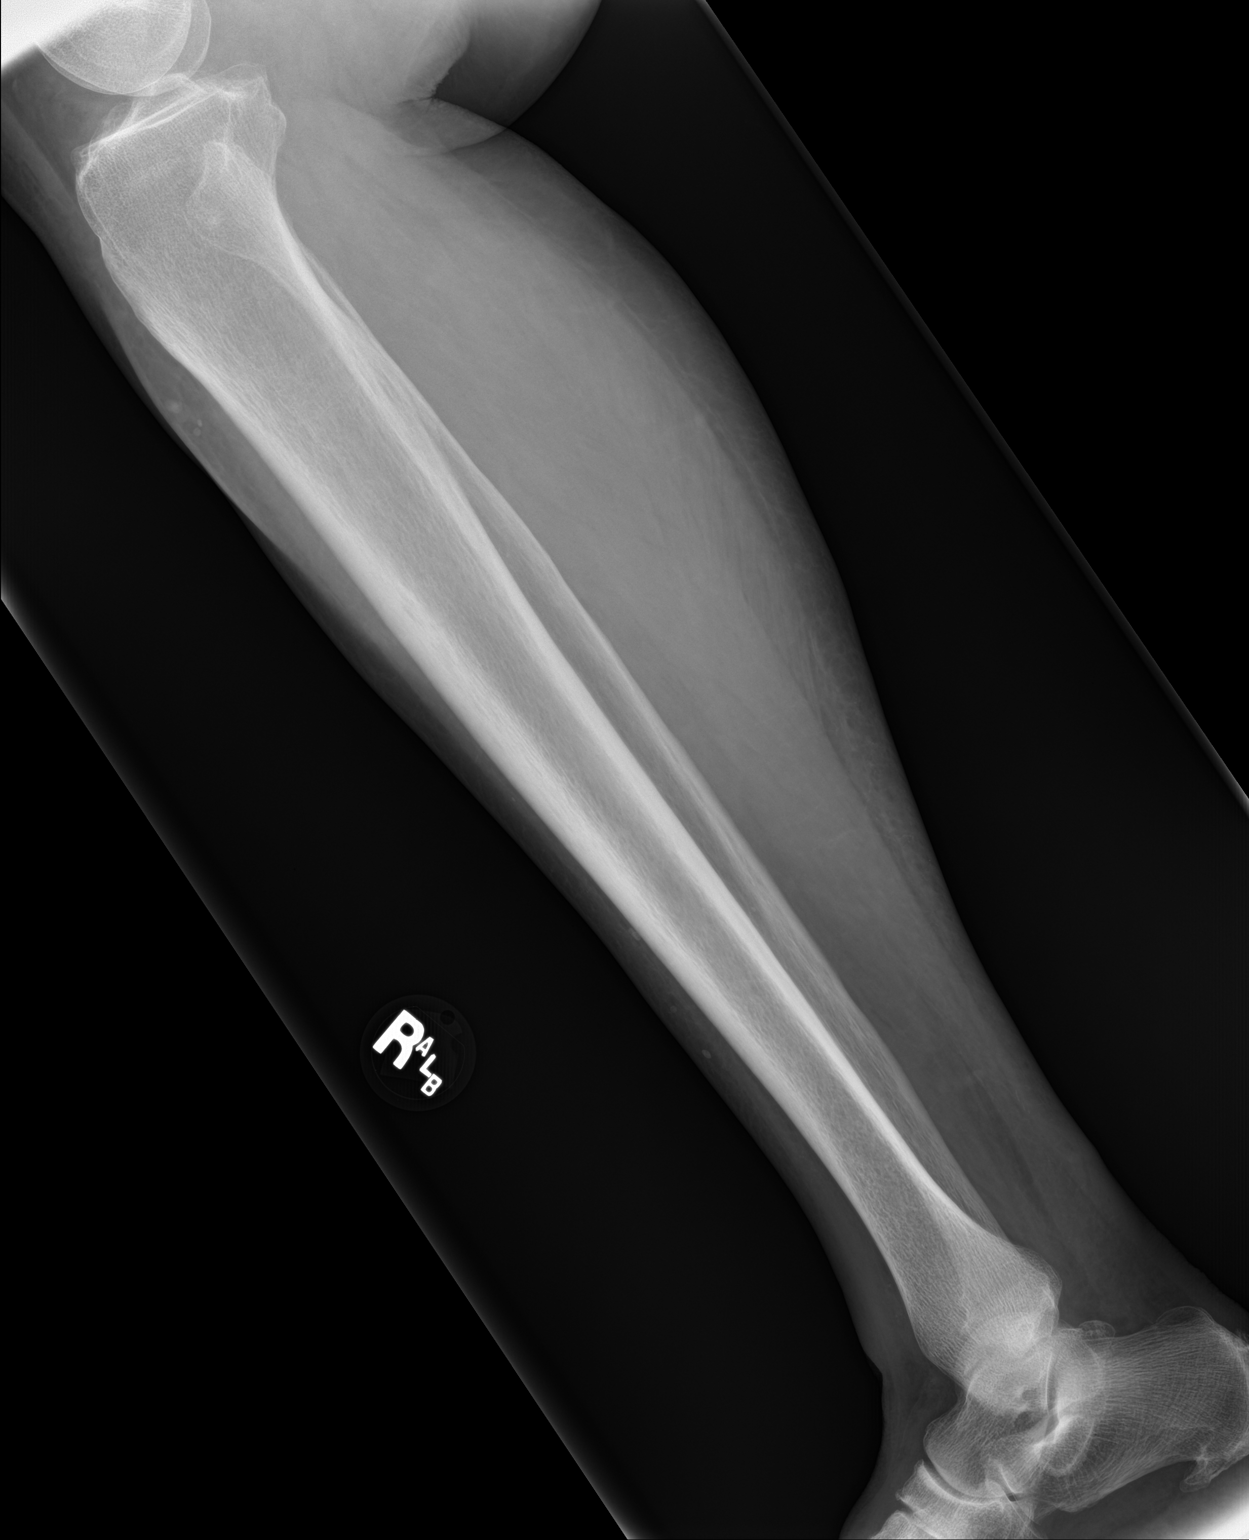

[2 of 2 positions shown; findings below may reference images not displayed]

FINDINGS: There is no evidence of fracture or other focal bone lesions. Soft
tissues are unremarkable. No radiopaque foreign body is noted.
IMPRESSION: Negative.

## 2020-05-23 NOTE — Progress Notes (Signed)
Date:  05/23/2020   Name:  [name] [facility]   DOB:  1951-04-25   MRN:  [id number]   Chief Complaint: Ankle infection (above right ankle X1 month, still the same, no pain, no oozing )  Patient is a 69 year old female who presents for a wound recheck exam. The patient reports the following problems: Ulcerative puncture wound and healing.Marland Kitchen Health maintenance has been reviewed up-to-date.  Patient has been caring for her lower leg wound sustained when she hit her leg with a metal lawn chair on her deck.  Despite her cleaning the area there is been no significant healing of the ulcerated wound.  I do have concerns of a possible foreign body and we will obtain an x-ray to see if there is any metal particulate matter present.  In the meantime while we wait we will get a wound care consult for evaluation and treatment of wound.   Lab Results  Component Value Date   CREATININE 1.59 (H) 02/10/2020   BUN 27 02/10/2020   NA 141 02/10/2020   K 4.2 02/10/2020   CL 102 02/10/2020   CO2 26 02/10/2020   Lab Results  Component Value Date   CHOL 196 02/10/2020   HDL 64 02/10/2020   LDLCALC 111 (H) 02/10/2020   TRIG 122 02/10/2020   CHOLHDL 3.4 09/09/2017   Lab Results  Component Value Date   TSH 2.080 02/10/2020   No results found for: HGBA1C Lab Results  Component Value Date   WBC 5.6 10/14/2019   HGB 11.9 10/14/2019   HCT 35.8 10/14/2019   MCV 100 (H) 10/14/2019   PLT 291 10/14/2019   Lab Results  Component Value Date   ALT 16 02/10/2020   AST 22 02/10/2020   ALKPHOS 90 02/10/2020   BILITOT 0.6 02/10/2020     Review of Systems  Constitutional: Negative.  Negative for chills, fatigue, fever and unexpected weight change.  HENT: Negative for congestion, ear discharge, ear pain, rhinorrhea, sinus pressure, sneezing and sore throat.   Eyes: Negative for photophobia, pain, discharge, redness and itching.  Respiratory: Negative for cough, shortness of breath, wheezing and stridor.    Gastrointestinal: Negative for abdominal pain, blood in stool, constipation, diarrhea, nausea and vomiting.  Endocrine: Negative for cold intolerance, heat intolerance, polydipsia, polyphagia and polyuria.  Genitourinary: Negative for dysuria, flank pain, frequency, hematuria, menstrual problem, pelvic pain, urgency, vaginal bleeding and vaginal discharge.  Musculoskeletal: Negative for arthralgias, back pain and myalgias.  Skin: Negative for rash.  Allergic/Immunologic: Negative for environmental allergies and food allergies.  Neurological: Negative for dizziness, weakness, light-headedness, numbness and headaches.  Hematological: Negative for adenopathy. Does not bruise/bleed easily.  Psychiatric/Behavioral: Negative for dysphoric mood. The patient is not nervous/anxious.     Patient Active Problem List   Diagnosis Date Noted  . Antiphospholipid antibody syndrome ([place]) 02/10/2020  . Acute kidney failure ([place]) 11/04/2019  . Edema of lower extremity 11/04/2019  . Stage 3 chronic kidney disease 11/04/2019  . Popliteal bursitis of left knee 08/25/2019  . Familial hypercholesterolemia 08/25/2019  . Renal insufficiency 08/25/2019  . Low hemoglobin 08/25/2019  . History of essential hypertension 08/25/2019  . Seasonal allergic rhinitis due to pollen 02/24/2019  . Age-related osteoporosis without current pathological fracture 01/20/2018  . Osteoporosis 11/12/2017  . Urinary incontinence 05/28/2017  . Knee pain 05/08/2017  . Muscle weakness 05/08/2017  . Sprain of MCL (medial collateral ligament) of knee 05/08/2017  . Adult hypothyroidism 10/23/2016  . Essential hypertension 10/23/2016  .  Gastroesophageal reflux disease without esophagitis 10/23/2016  . Recurrent major depressive disorder, in full remission ([place]) 10/23/2016  . Anticoagulant long-term use 10/23/2016    Allergies  Allergen Reactions  . Penicillin G Rash    Past Surgical History:  Procedure Laterality Date  .  CESAREAN SECTION    . CHOLECYSTECTOMY    . COLONOSCOPY  2012   repeat in 10 yrs  . KNEE ARTHROSCOPY W/ MENISCAL REPAIR Left     Social History   Tobacco Use  . Smoking status: Never Smoker  . Smokeless tobacco: Never Used  . Tobacco comment: none  Vaping Use  . Vaping Use: Never used  Substance Use Topics  . Alcohol use: Yes    Alcohol/week: 1.0 standard drink    Types: 1 Glasses of wine per week    Comment: occasional drink  . Drug use: No     Medication list has been reviewed and updated.  Current Meds  Medication Sig  . atorvastatin (LIPITOR) 10 MG tablet Take 1 tablet (10 mg total) by mouth daily.  Marland Kitchen escitalopram (LEXAPRO) 20 MG tablet Take 1 tablet (20 mg total) by mouth daily.  . famotidine (PEPCID) 20 MG tablet Take 1 tablet by mouth daily. singh  . ferrous sulfate 325 (65 FE) MG EC tablet Take 1 tablet (325 mg total) by mouth daily with breakfast.  . levothyroxine (SYNTHROID) 75 MCG tablet Take 1 tablet (75 mcg total) by mouth daily.  . montelukast (SINGULAIR) 10 MG tablet TAKE 1 TABLET BY MOUTH EVERYDAY AT BEDTIME  . Multiple Vitamins-Minerals (CENTRUM SILVER 50+WOMEN PO) Take 1 tablet by mouth daily.  . mupirocin ointment (BACTROBAN) 2 % Place 1 application into the nose 2 (two) times daily.  . solifenacin (VESICARE) 5 MG tablet Take 1 tablet by mouth daily. urology  . torsemide (DEMADEX) 5 MG tablet Take 1 tablet by mouth daily. singh  . traZODone (DESYREL) 50 MG tablet Take 1 tablet (50 mg total) by mouth daily.  . vitamin B-12 (CYANOCOBALAMIN) 1000 MCG tablet Take 1,000 mcg by mouth daily.  Marland Kitchen warfarin (COUMADIN) 1 MG tablet TAKE 1 TABLET BY MOUTH EVERY DAY  . warfarin (COUMADIN) 2 MG tablet Take 1 tablet (2 mg total) by mouth daily.  Marland Kitchen warfarin (COUMADIN) 3 MG tablet Take 1 tablet (3 mg total) by mouth daily.    PHQ 2/9 Scores 05/23/2020 04/26/2020 02/22/2020 02/10/2020  PHQ - 2 Score 0 0 0 0  PHQ- 9 Score 0 2 - 0    GAD 7 : Generalized Anxiety Score  05/23/2020 04/26/2020 02/10/2020 11/03/2019  Nervous, Anxious, on Edge 0 0 0 0  Control/stop worrying 0 1 0 0  Worry too much - different things 0 1 0 0  Trouble relaxing 0 0 0 0  Restless 0 0 0 0  Easily annoyed or irritable 0 0 0 0  Afraid - awful might happen 0 0 0 0  Total GAD 7 Score 0 2 0 0  Anxiety Difficulty Not difficult at all Not difficult at all - -    BP Readings from Last 3 Encounters:  05/23/20 130/80  05/03/20 120/82  04/26/20 120/70    Physical Exam Vitals and nursing note reviewed.  Constitutional:      General: She is not in acute distress.    Appearance: She is not diaphoretic.  HENT:     Head: Normocephalic and atraumatic.     Right Ear: Tympanic membrane, ear canal and external ear normal.     Left  Ear: Tympanic membrane, ear canal and external ear normal.     Nose: Nose normal.  Eyes:     General:        Right eye: No discharge.        Left eye: No discharge.     Conjunctiva/sclera: Conjunctivae normal.     Pupils: Pupils are equal, round, and reactive to light.  Neck:     Thyroid: No thyromegaly.     Vascular: No JVD.  Cardiovascular:     Rate and Rhythm: Normal rate and regular rhythm.     Heart sounds: Normal heart sounds. No murmur heard.  No friction rub. No gallop.   Pulmonary:     Effort: Pulmonary effort is normal.     Breath sounds: Normal breath sounds. No wheezing or rhonchi.  Abdominal:     General: Bowel sounds are normal.     Palpations: Abdomen is soft. There is no mass.     Tenderness: There is no abdominal tenderness. There is no guarding.  Musculoskeletal:        General: Normal range of motion.     Cervical back: Normal range of motion and neck supple.  Lymphadenopathy:     Cervical: No cervical adenopathy.  Skin:    General: Skin is warm and dry.     Findings: Wound present.          Comments: .8cmx.8cmx.8cm ulcer with surface eschar  Neurological:     Mental Status: She is alert.     Deep Tendon Reflexes: Reflexes  are normal and symmetric.     Wt Readings from Last 3 Encounters:  05/23/20 186 lb (84.4 kg)  05/03/20 188 lb (85.3 kg)  04/26/20 186 lb (84.4 kg)    BP 130/80   Pulse 80   Ht 5\' 8"  (1.727 m)   Wt 186 lb (84.4 kg)   BMI 28.28 kg/m   Assessment and Plan: 1. Cellulitis of right lower extremity Area surrounding erythema of ulcerated area consistent with cellulitis seems to be healing somewhat status post doxycycline.  But the fact that the ulceration has not granulated any whatsoever we will refer for wound care evaluation likely debridement and changing treatment. - AMB referral to wound care center - DG Tibia/Fibula Right; Future  2. Puncture wound As noted above this is been a nonhealing wound.  We will first evaluate for any foreign body that may be perpetuating this wound and delaying the wound healing process.  We will obtain a x-ray of the tibia-fibula for soft tissue evaluation.  In the meantime we will make referral to wound care center for treatment of on healing ulceration. - AMB referral to wound care center - DG Tibia/Fibula Right; Future  3. Anticoagulant long-term use Patient returns today to have her coagulation recheck. - INR/PT

## 2020-05-24 LAB — PROTIME-INR
INR: 1.9 — ABNORMAL HIGH (ref 0.9–1.2)
Prothrombin Time: 20.3 s — ABNORMAL HIGH (ref 9.1–12.0)

## 2020-05-25 ENCOUNTER — Telehealth: Payer: Self-pay | Admitting: Family Medicine

## 2020-05-25 NOTE — Telephone Encounter (Signed)
Staff message sent to referrals team to check on wound care referral.   CM

## 2020-05-25 NOTE — Telephone Encounter (Unsigned)
Copied from [place] [id number]. Topic: General - Other >> May 25, 2020  8:30 AM Keene Breath wrote: Reason for CRM: Patient called to inform Baxter Flattery that the wound care office has not called her yet.  She stated that Baxter Flattery wanted to know if they had not reached out to her.  Please call patient to discuss at [phone]

## 2020-06-02 ENCOUNTER — Telehealth: Payer: Self-pay | Admitting: Family Medicine

## 2020-06-02 NOTE — Telephone Encounter (Signed)
Copied from [place] [id number]. Topic: General - Call Back - No Documentation >> Jun 02, 2020 10:16 AM Jaynie Collins D wrote: Reason for CRM: Patient requesting to speak to "Clarise Cruz" to let her know that the wound care center she was sent to wasn't able to see her until 06/20/2020, so patient is going to another wound care center that can see her sooner.

## 2020-06-02 NOTE — Telephone Encounter (Signed)
noted 

## 2020-06-03 ENCOUNTER — Other Ambulatory Visit: Payer: Self-pay

## 2020-06-03 ENCOUNTER — Ambulatory Visit (INDEPENDENT_AMBULATORY_CARE_PROVIDER_SITE_OTHER): Payer: Medicare Other | Admitting: Family Medicine

## 2020-06-03 ENCOUNTER — Encounter: Payer: Self-pay | Admitting: Family Medicine

## 2020-06-03 VITALS — BP 120/80 | HR 64 | Ht 68.0 in | Wt 188.0 lb

## 2020-06-03 DIAGNOSIS — H5347 Heteronymous bilateral field defects: Secondary | ICD-10-CM

## 2020-06-03 DIAGNOSIS — R413 Other amnesia: Secondary | ICD-10-CM | POA: Diagnosis not present

## 2020-06-03 DIAGNOSIS — Z7901 Long term (current) use of anticoagulants: Secondary | ICD-10-CM

## 2020-06-03 DIAGNOSIS — D6861 Antiphospholipid syndrome: Secondary | ICD-10-CM | POA: Diagnosis not present

## 2020-06-03 DIAGNOSIS — Z8673 Personal history of transient ischemic attack (TIA), and cerebral infarction without residual deficits: Secondary | ICD-10-CM

## 2020-06-03 DIAGNOSIS — D539 Nutritional anemia, unspecified: Secondary | ICD-10-CM

## 2020-06-03 NOTE — Progress Notes (Signed)
Date:  06/03/2020   Name:  [name] [facility]   DOB:  09/26/1951   MRN:  [id number]   Chief Complaint: memory issues (daughter has recognized her not being able to remember things)  Neurologic Problem The patient's pertinent negatives include no altered mental status, focal sensory loss, focal weakness, loss of balance, memory loss or weakness. Primary symptoms comment: memory concern by daughter. This is a new problem. The current episode started more than 1 year ago. The neurological problem developed gradually. The problem is unchanged. There was no focality noted. Pertinent negatives include no abdominal pain, back pain, confusion, dizziness, fatigue, fever, headaches, light-headedness, nausea, shortness of breath or vomiting. Past treatments include nothing. The treatment provided mild relief. There is no history of a CVA or dementia. (Tia/residual left hemianopsia/hx antiphospholipid antibody syndrome)    Lab Results  Component Value Date   CREATININE 1.59 (H) 02/10/2020   BUN 27 02/10/2020   NA 141 02/10/2020   K 4.2 02/10/2020   CL 102 02/10/2020   CO2 26 02/10/2020   Lab Results  Component Value Date   CHOL 196 02/10/2020   HDL 64 02/10/2020   LDLCALC 111 (H) 02/10/2020   TRIG 122 02/10/2020   CHOLHDL 3.4 09/09/2017   Lab Results  Component Value Date   TSH 2.080 02/10/2020   No results found for: HGBA1C Lab Results  Component Value Date   WBC 5.6 10/14/2019   HGB 11.9 10/14/2019   HCT 35.8 10/14/2019   MCV 100 (H) 10/14/2019   PLT 291 10/14/2019   Lab Results  Component Value Date   ALT 16 02/10/2020   AST 22 02/10/2020   ALKPHOS 90 02/10/2020   BILITOT 0.6 02/10/2020     Review of Systems  Constitutional: Negative.  Negative for chills, fatigue, fever and unexpected weight change.  HENT: Negative for congestion, ear discharge, ear pain, rhinorrhea, sinus pressure, sneezing and sore throat.   Eyes: Negative for photophobia, pain, discharge, redness  and itching.  Respiratory: Negative for cough, shortness of breath, wheezing and stridor.   Gastrointestinal: Negative for abdominal pain, blood in stool, constipation, diarrhea, nausea and vomiting.  Endocrine: Negative for cold intolerance, heat intolerance, polydipsia, polyphagia and polyuria.  Genitourinary: Negative for dysuria, flank pain, frequency, hematuria, menstrual problem, pelvic pain, urgency, vaginal bleeding and vaginal discharge.  Musculoskeletal: Negative for arthralgias, back pain and myalgias.  Skin: Negative for rash.  Allergic/Immunologic: Negative for environmental allergies and food allergies.  Neurological: Negative for dizziness, focal weakness, weakness, light-headedness, numbness, headaches and loss of balance.  Hematological: Negative for adenopathy. Does not bruise/bleed easily.  Psychiatric/Behavioral: Negative for confusion, dysphoric mood and memory loss. The patient is not nervous/anxious.     Patient Active Problem List   Diagnosis Date Noted  . Antiphospholipid antibody syndrome ([place]) 02/10/2020  . Acute kidney failure ([place]) 11/04/2019  . Edema of lower extremity 11/04/2019  . Stage 3 chronic kidney disease 11/04/2019  . Popliteal bursitis of left knee 08/25/2019  . Familial hypercholesterolemia 08/25/2019  . Renal insufficiency 08/25/2019  . Low hemoglobin 08/25/2019  . History of essential hypertension 08/25/2019  . Seasonal allergic rhinitis due to pollen 02/24/2019  . Age-related osteoporosis without current pathological fracture 01/20/2018  . Osteoporosis 11/12/2017  . Urinary incontinence 05/28/2017  . Knee pain 05/08/2017  . Muscle weakness 05/08/2017  . Sprain of MCL (medial collateral ligament) of knee 05/08/2017  . Adult hypothyroidism 10/23/2016  . Essential hypertension 10/23/2016  . Gastroesophageal reflux disease without esophagitis 10/23/2016  .  Recurrent major depressive disorder, in full remission ([place]) 10/23/2016  .  Anticoagulant long-term use 10/23/2016    Allergies  Allergen Reactions  . Penicillin G Rash    Past Surgical History:  Procedure Laterality Date  . CESAREAN SECTION    . CHOLECYSTECTOMY    . COLONOSCOPY  2012   repeat in 10 yrs  . KNEE ARTHROSCOPY W/ MENISCAL REPAIR Left     Social History   Tobacco Use  . Smoking status: Never Smoker  . Smokeless tobacco: Never Used  . Tobacco comment: none  Vaping Use  . Vaping Use: Never used  Substance Use Topics  . Alcohol use: Yes    Alcohol/week: 1.0 standard drink    Types: 1 Glasses of wine per week    Comment: occasional drink  . Drug use: No     Medication list has been reviewed and updated.  Current Meds  Medication Sig  . atorvastatin (LIPITOR) 10 MG tablet Take 1 tablet (10 mg total) by mouth daily.  Marland Kitchen escitalopram (LEXAPRO) 20 MG tablet Take 1 tablet (20 mg total) by mouth daily.  . famotidine (PEPCID) 20 MG tablet Take 1 tablet by mouth daily. singh  . ferrous sulfate 325 (65 FE) MG EC tablet Take 1 tablet (325 mg total) by mouth daily with breakfast.  . levothyroxine (SYNTHROID) 75 MCG tablet Take 1 tablet (75 mcg total) by mouth daily.  . montelukast (SINGULAIR) 10 MG tablet TAKE 1 TABLET BY MOUTH EVERYDAY AT BEDTIME  . Multiple Vitamins-Minerals (CENTRUM SILVER 50+WOMEN PO) Take 1 tablet by mouth daily.  . mupirocin ointment (BACTROBAN) 2 % Place 1 application into the nose 2 (two) times daily.  . solifenacin (VESICARE) 5 MG tablet Take 1 tablet by mouth daily. urology  . torsemide (DEMADEX) 5 MG tablet Take 1 tablet by mouth daily. singh  . traZODone (DESYREL) 50 MG tablet Take 1 tablet (50 mg total) by mouth daily.  . vitamin B-12 (CYANOCOBALAMIN) 1000 MCG tablet Take 1,000 mcg by mouth daily.  Marland Kitchen warfarin (COUMADIN) 1 MG tablet TAKE 1 TABLET BY MOUTH EVERY DAY  . warfarin (COUMADIN) 2 MG tablet Take 1 tablet (2 mg total) by mouth daily.  Marland Kitchen warfarin (COUMADIN) 3 MG tablet Take 1 tablet (3 mg total) by mouth  daily.    PHQ 2/9 Scores 06/03/2020 05/23/2020 04/26/2020 02/22/2020  PHQ - 2 Score 0 0 0 0  PHQ- 9 Score 0 0 2 -    GAD 7 : Generalized Anxiety Score 06/03/2020 05/23/2020 04/26/2020 02/10/2020  Nervous, Anxious, on Edge 0 0 0 0  Control/stop worrying 0 0 1 0  Worry too much - different things 0 0 1 0  Trouble relaxing 0 0 0 0  Restless 0 0 0 0  Easily annoyed or irritable 0 0 0 0  Afraid - awful might happen 0 0 0 0  Total GAD 7 Score 0 0 2 0  Anxiety Difficulty - Not difficult at all Not difficult at all -    BP Readings from Last 3 Encounters:  06/03/20 120/80  05/23/20 130/80  05/03/20 120/82    Physical Exam Vitals and nursing note reviewed.  Constitutional:      General: She is not in acute distress.    Appearance: She is not diaphoretic.  HENT:     Head: Normocephalic and atraumatic.     Right Ear: External ear normal.     Left Ear: External ear normal.     Nose: Nose normal.  Eyes:  General:        Right eye: No discharge.        Left eye: No discharge.     Conjunctiva/sclera: Conjunctivae normal.     Pupils: Pupils are equal, round, and reactive to light.  Neck:     Thyroid: No thyromegaly.     Vascular: No JVD.  Cardiovascular:     Rate and Rhythm: Normal rate and regular rhythm.     Heart sounds: Normal heart sounds. No murmur heard.  No friction rub. No gallop.   Pulmonary:     Effort: Pulmonary effort is normal.     Breath sounds: Normal breath sounds.  Abdominal:     General: Bowel sounds are normal.     Palpations: Abdomen is soft. There is no mass.     Tenderness: There is no abdominal tenderness. There is no guarding.  Musculoskeletal:        General: Normal range of motion.     Cervical back: Normal range of motion and neck supple.  Lymphadenopathy:     Cervical: No cervical adenopathy.  Skin:    General: Skin is warm and dry.  Neurological:     Mental Status: She is alert.     Deep Tendon Reflexes: Reflexes are normal and symmetric.      Wt Readings from Last 3 Encounters:  06/03/20 188 lb (85.3 kg)  05/23/20 186 lb (84.4 kg)  05/03/20 188 lb (85.3 kg)    BP 120/80   Pulse 64   Ht 5\' 8"  (1.727 m)   Wt 188 lb (85.3 kg)   BMI 28.59 kg/m   Assessment and Plan:  1. Mild memory disturbance Patient has some memory loss as noted by her daughter whenever she gives her a list of things and that it is required that the daughter sends a text to help patient remember.  This is become more concerning to the daughter than the patient and I have decided to have a formal evaluation for memory given the patient's below history of neurologic concerns. - Ambulatory referral to Neurology  2. History of CVA (cerebrovascular accident) Several several years ago patient had a CVA which resulted in a hemianopsia.  He is currently stable and is on - Ambulatory referral to Neurology  3. Hemianopsia Patient had a resultant left hemianopsia of the lateral aspect.  This is resulted in the patient having a significant visual loss.  At this point in time patient is doing relatively well with her limitation but has continued to take anticoagulation therapy. - Ambulatory referral to Neurology  4. Antiphospholipid antibody syndrome [facility]) Patient has a history of an antiphospholipid body syndrome for which had an increase coagulability and is currently on Coumadin. - Ambulatory referral to Neurology  5. Chronic anticoagulation Patient is currently taking Coumadin for anticoagulation. - Ambulatory referral to Neurology  6. Anemia, macrocytic Daughter is also concerned about patient's fatigue on review we have done thyroid and other panels.  It was noted on a CBC that there was a slight anemia and at 1 point in time there was some increase in the indices.  At this point in time we will repeat a CBC but we will also check CBC and folic acid given the macrocytic appearance. - B12 and Folate Panel - CBC with Differential/Platelet

## 2020-06-04 ENCOUNTER — Other Ambulatory Visit: Payer: Self-pay | Admitting: Family Medicine

## 2020-06-04 DIAGNOSIS — Z7901 Long term (current) use of anticoagulants: Secondary | ICD-10-CM

## 2020-06-04 LAB — CBC WITH DIFFERENTIAL/PLATELET
Basophils Absolute: 0.1 10*3/uL (ref 0.0–0.2)
Basos: 1 %
EOS (ABSOLUTE): 0.1 10*3/uL (ref 0.0–0.4)
Eos: 3 %
Hematocrit: 31.1 % — ABNORMAL LOW (ref 34.0–46.6)
Hemoglobin: 10.7 g/dL — ABNORMAL LOW (ref 11.1–15.9)
Immature Grans (Abs): 0 10*3/uL (ref 0.0–0.1)
Immature Granulocytes: 0 %
Lymphocytes Absolute: 1.6 10*3/uL (ref 0.7–3.1)
Lymphs: 31 %
MCH: 33 pg (ref 26.6–33.0)
MCHC: 34.4 g/dL (ref 31.5–35.7)
MCV: 96 fL (ref 79–97)
Monocytes Absolute: 0.5 10*3/uL (ref 0.1–0.9)
Monocytes: 10 %
Neutrophils Absolute: 2.9 10*3/uL (ref 1.4–7.0)
Neutrophils: 55 %
Platelets: 239 10*3/uL (ref 150–450)
RBC: 3.24 x10E6/uL — ABNORMAL LOW (ref 3.77–5.28)
RDW: 11.9 % (ref 11.7–15.4)
WBC: 5.2 10*3/uL (ref 3.4–10.8)

## 2020-06-04 LAB — B12 AND FOLATE PANEL
Folate: 19.4 ng/mL (ref >3.0)
Vitamin B-12: 1453 pg/mL — ABNORMAL HIGH (ref 232–1245)

## 2020-06-04 NOTE — Telephone Encounter (Signed)
Requested medication (s) are due for refill today: yes  Requested medication (s) are on the active medication list: yes  Last refill:  03/10/20  Future visit scheduled: no  Notes to clinic:  med not delegated to NT to RF   Requested Prescriptions  Pending Prescriptions Disp Refills   warfarin (COUMADIN) 2 MG tablet [Pharmacy Med Name: WARFARIN SODIUM 2 MG TABLET] 90 tablet 1    Sig: TAKE 1 TABLET BY [place]      Hematology:  Anticoagulants - warfarin Failed - 06/04/2020  9:48 AM      Failed - This refill cannot be delegated      Failed - If the patient is managed by Coumadin Clinic - route to their Pool. If not, forward to the provider.      Failed - INR in normal range and within 30 days    INR  Date Value Ref Range Status  05/23/2020 1.9 (H) 0.9 - 1.2 Final    Comment:    Reference interval is for non-anticoagulated patients. Suggested INR therapeutic range for Vitamin K antagonist therapy:    Standard Dose (moderate intensity                   therapeutic range):       2.0 - 3.0    Higher intensity therapeutic range       2.5 - 3.5   04/23/2012 1.5  Final    Comment:    INR reference interval applies to patients on anticoagulant therapy. A single INR therapeutic range for coumarins is not optimal for all indications; however, the suggested range for most indications is 2.0 - 3.0. Exceptions to the INR Reference Range may include: Prosthetic heart valves, acute myocardial infarction, prevention of myocardial infarction, and combinations of aspirin and anticoagulant. The need for a higher or lower target INR must be assessed individually. Reference: The Pharmacology and Management of the Vitamin K  antagonists: the seventh ACCP Conference on Antithrombotic and Thrombolytic Therapy. PHXTA.5697 Sept:126 (3suppl): [id number]. A HCT value >55% may artifactually increase the PT.  In one study,  the increase was an average of 25%. Reference:  "Effect on Routine and  Special Coagulation Testing Values of Citrate Anticoagulant Adjustment in Patients with High HCT Values." American Journal of Clinical Pathology 2006;126:400-405.           Passed - Valid encounter within last 3 months    Recent Outpatient Visits           Yesterday Mild memory disturbance   [facility] Juline Patch, MD   1 week ago Cellulitis of right lower extremity   [facility] Juline Patch, MD   1 month ago Anticoagulant long-term use   [facility] Juline Patch, MD   1 month ago Cellulitis of right lower extremity   [facility] Juline Patch, MD   3 months ago Taking medication for chronic disease   [facility] Juline Patch, MD

## 2020-06-20 ENCOUNTER — Other Ambulatory Visit: Payer: Self-pay

## 2020-06-20 ENCOUNTER — Encounter: Payer: Medicare Other | Attending: Physician Assistant | Admitting: Physician Assistant

## 2020-06-20 ENCOUNTER — Other Ambulatory Visit: Payer: Self-pay | Admitting: Family Medicine

## 2020-06-20 DIAGNOSIS — I10 Essential (primary) hypertension: Secondary | ICD-10-CM | POA: Insufficient documentation

## 2020-06-20 DIAGNOSIS — M1990 Unspecified osteoarthritis, unspecified site: Secondary | ICD-10-CM | POA: Insufficient documentation

## 2020-06-20 DIAGNOSIS — I872 Venous insufficiency (chronic) (peripheral): Secondary | ICD-10-CM | POA: Insufficient documentation

## 2020-06-20 DIAGNOSIS — Z880 Allergy status to penicillin: Secondary | ICD-10-CM | POA: Diagnosis not present

## 2020-06-20 DIAGNOSIS — I7389 Other specified peripheral vascular diseases: Secondary | ICD-10-CM | POA: Diagnosis not present

## 2020-06-20 DIAGNOSIS — Z86718 Personal history of other venous thrombosis and embolism: Secondary | ICD-10-CM | POA: Diagnosis not present

## 2020-06-20 DIAGNOSIS — L97812 Non-pressure chronic ulcer of other part of right lower leg with fat layer exposed: Secondary | ICD-10-CM | POA: Diagnosis not present

## 2020-06-20 DIAGNOSIS — Z7901 Long term (current) use of anticoagulants: Secondary | ICD-10-CM

## 2020-06-20 NOTE — Progress Notes (Addendum)
VICTORYA, HILLMAN ([id number]) Visit Report for 06/20/2020 Allergy List Details Patient Name: [name], [name]. Date of Service: 06/20/2020 12:45 PM Medical Record Number: [id number] Patient Account Number: [id number] Date of Birth/Sex: [birth year] (68 y.o. F) Treating RN: Cornell Barman Primary Care Reizel Calzada: Otilio Miu Other Clinician: Referring Leona Alen: Otilio Miu Treating Jaisha Villacres/Extender: Melburn Hake, HOYT Weeks in Treatment: 0 Allergies Active Allergies penicillin Allergy Notes Electronic Signature(s) Signed: 06/20/2020 5:16:05 PM By: Gretta Cool, BSN, RN, CWS, Kim RN, BSN Entered By: Gretta Cool, BSN, RN, CWS, Kim on 06/20/2020 13:12:14 [name] Batman ([id number]) -------------------------------------------------------------------------------- Arrival Information Details Patient Name: [name], [name]. Date of Service: 06/20/2020 12:45 PM Medical Record Number: [id number] Patient Account Number: [id number] Date of Birth/Sex: Oct 14, 1951 (68 y.o. F) Treating RN: Cornell Barman Primary Care Shwanda Soltis: Otilio Miu Other Clinician: Referring Ermine Spofford: Otilio Miu Treating Alisa Stjames/Extender: Melburn Hake, HOYT Weeks in Treatment: 0 Visit Information Patient Arrived: Ambulatory Arrival Time: 12:54 Accompanied By: self Transfer Assistance: None Patient Identification Verified: Yes Secondary Verification Process Completed: Yes Patient Has Alerts: Yes Patient Alerts: Patient on Blood Thinner Warfarin Electronic Signature(s) Signed: 06/21/2020 7:14:55 AM By: Gretta Cool, BSN, RN, CWS, Kim RN, BSN Previous Signature: 06/20/2020 4:26:42 PM Version By: Lorine Bears RCP, RRT, CHT Entered By: Gretta Cool, BSN, RN, CWS, Kim on 06/21/2020 07:14:55 [name] Batman ([id number]) -------------------------------------------------------------------------------- Clinic Level of Care Assessment Details Patient Name: LORALAI, [name]. Date of Service: 06/20/2020 12:45 PM Medical Record  Number: [id number] Patient Account Number: [id number] Date of Birth/Sex: November 23, 1951 (68 y.o. F) Treating RN: Cornell Barman Primary Care Marieanne Marxen: Otilio Miu Other Clinician: Referring Lilac Hoff: Otilio Miu Treating Taft Worthing/Extender: STONE III, HOYT Weeks in Treatment: 0 Clinic Level of Care Assessment Items TOOL 1 Quantity Score []  - Use when EandM and Procedure is performed on INITIAL visit 0 ASSESSMENTS - Nursing Assessment / Reassessment X - General Physical Exam (combine w/ comprehensive assessment (listed just below) when performed on new 1 20 pt. evals) X- 1 25 Comprehensive Assessment (HX, ROS, Risk Assessments, Wounds Hx, etc.) ASSESSMENTS - Wound and Skin Assessment / Reassessment []  - Dermatologic / Skin Assessment (not related to wound area) 0 ASSESSMENTS - Ostomy and/or Continence Assessment and Care []  - Incontinence Assessment and Management 0 []  - 0 Ostomy Care Assessment and Management (repouching, etc.) PROCESS - Coordination of Care X - Simple Patient / Family Education for ongoing care 1 15 []  - 0 Complex (extensive) Patient / Family Education for ongoing care []  - 0 Staff obtains Programmer, systems, Records, Test Results / Process Orders []  - 0 Staff telephones HHA, Nursing Homes / Clarify orders / etc []  - 0 Routine Transfer to another Facility (non-emergent condition) []  - 0 Routine Hospital Admission (non-emergent condition) X- 1 15 New Admissions / Biomedical engineer / Ordering NPWT, Apligraf, etc. []  - 0 Emergency Hospital Admission (emergent condition) PROCESS - Special Needs []  - Pediatric / Minor Patient Management 0 []  - 0 Isolation Patient Management []  - 0 Hearing / Language / Visual special needs []  - 0 Assessment of Community assistance (transportation, D/C planning, etc.) []  - 0 Additional assistance / Altered mentation []  - 0 Support Surface(s) Assessment (bed, cushion, seat, etc.) INTERVENTIONS - Miscellaneous []  - External ear exam  0 []  - 0 Patient Transfer (multiple staff / Civil Service fast streamer / Similar devices) []  - 0 Simple Staple / Suture removal (25 or less) []  - 0 Complex Staple / Suture removal (26 or more) []  - 0 Hypo/Hyperglycemic Management (do not check if billed separately) X- 1 15 Ankle / Brachial  Index (ABI) - do not check if billed separately Has the patient been seen at the hospital within the last three years: Yes Total Score: 90 Level Of Care: New/Established - Level 3 JANIECE, SCOVILL ([id number]) Electronic Signature(s) Signed: 06/20/2020 5:16:05 PM By: Gretta Cool, BSN, RN, CWS, Kim RN, BSN Entered By: Gretta Cool, BSN, RN, CWS, Kim on 06/20/2020 13:41:29 [name] Batman ([id number]) -------------------------------------------------------------------------------- Encounter Discharge Information Details Patient Name: [name], [name]. Date of Service: 06/20/2020 12:45 PM Medical Record Number: [id number] Patient Account Number: [id number] Date of Birth/Sex: 1951/09/25 (68 y.o. F) Treating RN: Cornell Barman Primary Care Hilery Wintle: Otilio Miu Other Clinician: Referring Fujie Dickison: Otilio Miu Treating Khyren Hing/Extender: Melburn Hake, HOYT Weeks in Treatment: 0 Encounter Discharge Information Items Post Procedure Vitals Discharge Condition: Stable Temperature (F): 98.4 Ambulatory Status: Ambulatory Pulse (bpm): 82 Discharge Destination: Home Respiratory Rate (breaths/min): 16 Transportation: Private Auto Blood Pressure (mmHg): 139/75 Accompanied By: self Schedule Follow-up Appointment: Yes Clinical Summary of Care: Electronic Signature(s) Signed: 06/20/2020 5:16:05 PM By: Gretta Cool, BSN, RN, CWS, Kim RN, BSN Entered By: Gretta Cool, BSN, RN, CWS, Kim on 06/20/2020 13:43:26 [name] Batman ([id number]) -------------------------------------------------------------------------------- Lower Extremity Assessment Details Patient Name: [name] Batman. Date of Service: 06/20/2020 12:45 PM Medical Record  Number: [id number] Patient Account Number: [id number] Date of Birth/Sex: 26-Jun-1951 (68 y.o. F) Treating RN: Cornell Barman Primary Care Tai Skelly: Otilio Miu Other Clinician: Referring Kaya Pottenger: Otilio Miu Treating Bless Lisenby/Extender: STONE III, HOYT Weeks in Treatment: 0 Edema Assessment Assessed: [Left: No] [Right: No] [Left: Edema] [Right: :] Calf Left: Right: Point of Measurement: 30 cm From Medial Instep cm 37.5 cm Ankle Left: Right: Point of Measurement: 10 cm From Medial Instep cm 21.2 cm Vascular Assessment Pulses: Dorsalis Pedis Palpable: [Right:Yes] Doppler Audible: [Right:Yes] Posterior Tibial Palpable: [Right:Yes] Doppler Audible: [Right:Yes] Blood Pressure: Brachial: [Right:120] Dorsalis Pedis: 168 Ankle: Posterior Tibial: 160 Ankle Brachial Index: [Right:1.40] Electronic Signature(s) Signed: 06/20/2020 5:16:05 PM By: Gretta Cool, BSN, RN, CWS, Kim RN, BSN Entered By: Gretta Cool, BSN, RN, CWS, Kim on 06/20/2020 13:26:42 [name] Batman ([id number]) -------------------------------------------------------------------------------- Multi Wound Chart Details Patient Name: [name] Batman. Date of Service: 06/20/2020 12:45 PM Medical Record Number: [id number] Patient Account Number: [id number] Date of Birth/Sex: 1951-07-16 (68 y.o. F) Treating RN: Cornell Barman Primary Care Samuell Knoble: Otilio Miu Other Clinician: Referring Arnold Kester: Otilio Miu Treating Lin Glazier/Extender: STONE III, HOYT Weeks in Treatment: 0 Vital Signs Height(in): 68 Pulse(bpm): 82 Weight(lbs): 184 Blood Pressure(mmHg): 139/75 Body Mass Index(BMI): 28 Temperature(F): 98.4 Respiratory Rate(breaths/min): 18 Photos: [N/A:N/A] Wound Location: Right, Medial Lower Leg N/A N/A Wounding Event: Trauma N/A N/A Primary Etiology: Trauma, Other N/A N/A Date Acquired: 04/02/2020 N/A N/A Weeks of Treatment: 0 N/A N/A Wound Status: Open N/A N/A Measurements L x W x D (cm) 1x1.1x0.1 N/A N/A Area (cm) :  0.864 N/A N/A Volume (cm) : 0.086 N/A N/A % Reduction in Area: 0.00% N/A N/A % Reduction in Volume: 0.00% N/A N/A Classification: Full Thickness Without Exposed N/A N/A Support Structures Exudate Amount: Medium N/A N/A Exudate Type: Serous N/A N/A Exudate Color: amber N/A N/A Wound Margin: Flat and Intact N/A N/A Granulation Amount: Medium (34-66%) N/A N/A Granulation Quality: Pink N/A N/A Necrotic Amount: Medium (34-66%) N/A N/A Exposed Structures: Fat Layer (Subcutaneous Tissue) N/A N/A Exposed: Yes Epithelialization: None N/A N/A Treatment Notes Electronic Signature(s) Signed: 06/20/2020 5:16:05 PM By: Gretta Cool, BSN, RN, CWS, Kim RN, BSN Entered By: Gretta Cool, BSN, RN, CWS, Kim on 06/20/2020 13:35:31 [name] Batman ([id number]) -------------------------------------------------------------------------------- Bibb Details Patient Name: [name], [name]. Date of  Service: 06/20/2020 12:45 PM Medical Record Number: [id number] Patient Account Number: [id number] Date of Birth/Sex: 10/17/1951 (68 y.o. F) Treating RN: Cornell Barman Primary Care Aima Mcwhirt: Otilio Miu Other Clinician: Referring Tyshae Stair: Otilio Miu Treating Ronin Crager/Extender: Melburn Hake, HOYT Weeks in Treatment: 0 Active Inactive Necrotic Tissue Nursing Diagnoses: Impaired tissue integrity related to necrotic/devitalized tissue Goals: Necrotic/devitalized tissue will be minimized in the wound bed Date Initiated: 06/20/2020 Target Resolution Date: 06/27/2020 Goal Status: Active Interventions: Assess patient pain level pre-, during and post procedure and prior to discharge Treatment Activities: Apply topical anesthetic as ordered : 06/20/2020 Excisional debridement : 06/20/2020 Notes: Orientation to the Wound Care Program Nursing Diagnoses: Knowledge deficit related to the wound healing center program Goals: Patient/caregiver will verbalize understanding of the [place] Date Initiated: 06/20/2020 Target Resolution Date: 06/27/2020 Goal Status: Active Interventions: Provide education on orientation to the wound center Notes: Wound/Skin Impairment Nursing Diagnoses: Impaired tissue integrity Goals: Ulcer/skin breakdown will have a volume reduction of 30% by week 4 Date Initiated: 06/20/2020 Target Resolution Date: 07/21/2020 Goal Status: Active Interventions: Assess patient/caregiver ability to obtain necessary supplies Assess ulceration(s) every visit Treatment Activities: Skin care regimen initiated : 06/20/2020 Notes: Electronic Signature(s) CONNA, TERADA ([id number]) Signed: 06/20/2020 5:16:05 PM By: Gretta Cool, BSN, RN, CWS, Kim RN, BSN Entered By: Gretta Cool, BSN, RN, CWS, Kim on 06/20/2020 13:35:14 [name] Batman ([id number]) -------------------------------------------------------------------------------- Pain Assessment Details Patient Name: [name] Batman. Date of Service: 06/20/2020 12:45 PM Medical Record Number: [id number] Patient Account Number: [id number] Date of Birth/Sex: Jun 27, 1951 (68 y.o. F) Treating RN: Cornell Barman Primary Care Raychelle Hudman: Otilio Miu Other Clinician: Referring Kymari Lollis: Otilio Miu Treating Pakou Rainbow/Extender: Melburn Hake, HOYT Weeks in Treatment: 0 Active Problems Location of Pain Severity and Description of Pain Patient Has Paino No Site Locations Pain Management and Medication Current Pain Management: Electronic Signature(s) Signed: 06/20/2020 4:26:42 PM By: Lorine Bears RCP, RRT, CHT Signed: 06/20/2020 5:16:05 PM By: Gretta Cool, BSN, RN, CWS, Kim RN, BSN Entered By: Lorine Bears on 06/20/2020 12:55:18 [name] Batman ([id number]) -------------------------------------------------------------------------------- Patient/Caregiver Education Details Patient Name: YUVAL, NOLET. Date of Service: 06/20/2020 12:45 PM Medical Record Number: [id number] Patient  Account Number: [id number] Date of Birth/Gender: 1951-11-05 (68 y.o. F) Treating RN: Cornell Barman Primary Care Physician: Otilio Miu Other Clinician: Referring Physician: Otilio Miu Treating Physician/Extender: Melburn Hake, HOYT Weeks in Treatment: 0 Education Assessment Education Provided To: Patient Education Topics Provided Wound/Skin Impairment: Handouts: Caring for Your Ulcer Methods: Demonstration, Explain/Verbal Responses: State content correctly Electronic Signature(s) Signed: 06/20/2020 5:16:05 PM By: Gretta Cool, BSN, RN, CWS, Kim RN, BSN Entered By: Gretta Cool, BSN, RN, CWS, Kim on 06/20/2020 13:41:46 [name] Batman ([id number]) -------------------------------------------------------------------------------- Wound Assessment Details Patient Name: [name], [name]. Date of Service: 06/20/2020 12:45 PM Medical Record Number: [id number] Patient Account Number: [id number] Date of Birth/Sex: 1951-02-07 (68 y.o. F) Treating RN: Cornell Barman Primary Care Allyson Tineo: Otilio Miu Other Clinician: Referring Kaeson Kleinert: Otilio Miu Treating Michelene Keniston/Extender: STONE III, HOYT Weeks in Treatment: 0 Wound Status Wound Number: 1 Primary Etiology: Trauma, Other Wound Location: Right, Medial Lower Leg Wound Status: Open Wounding Event: Trauma Date Acquired: 04/02/2020 Weeks Of Treatment: 0 Clustered Wound: No Photos Wound Measurements Length: (cm) 1 Width: (cm) 1.1 Depth: (cm) 0.1 Area: (cm) 0.864 Volume: (cm) 0.086 % Reduction in Area: 0% % Reduction in Volume: 0% Epithelialization: None Tunneling: No Undermining: No Wound Description Classification: Full Thickness Without Exposed Support Structu Wound Margin: Flat and Intact Exudate Amount: Medium Exudate Type: Serous Exudate Color: amber res Foul  Odor After Cleansing: No Slough/Fibrino Yes Wound Bed Granulation Amount: Medium (34-66%) Exposed Structure Granulation Quality: Pink Fat Layer (Subcutaneous Tissue)  Exposed: Yes Necrotic Amount: Medium (34-66%) Necrotic Quality: Adherent Slough Treatment Notes Wound #1 (Right, Medial Lower Leg) 1. Cleansed with: Clean wound with Normal Saline Cleanse wound with antibacterial soap and water 2. Anesthetic Topical 2.5%/2.5% Lidocaine and Prilocaine Cream 4. Dressing Applied: Other dressing (specify in notes) [name], [name] ([id number]) Notes Silver alginate, telfa Sharion Settler, Tubi-grip F Electronic Signature(s) Signed: 06/20/2020 5:16:05 PM By: Gretta Cool, BSN, RN, CWS, Kim RN, BSN Entered By: Gretta Cool, BSN, RN, CWS, Kim on 06/20/2020 13:09:51 [name] Batman ([id number]) -------------------------------------------------------------------------------- Vitals Details Patient Name: [name] Batman. Date of Service: 06/20/2020 12:45 PM Medical Record Number: [id number] Patient Account Number: [id number] Date of Birth/Sex: [birth year] (68 y.o. F) Treating RN: Cornell Barman Primary Care Samer Dutton: Otilio Miu Other Clinician: Referring Francena Zender: Otilio Miu Treating Mushka Laconte/Extender: STONE III, HOYT Weeks in Treatment: 0 Vital Signs Time Taken: 12:55 Temperature (F): 98.4 Height (in): 68 Pulse (bpm): 82 Source: Stated Respiratory Rate (breaths/min): 18 Weight (lbs): 184 Blood Pressure (mmHg): 139/75 Source: Measured Reference Range: 80 - 120 mg / dl Body Mass Index (BMI): 28 Electronic Signature(s) Signed: 06/20/2020 4:26:42 PM By: Lorine Bears RCP, RRT, CHT Entered By: Lorine Bears on 06/20/2020 12:57:03

## 2020-06-20 NOTE — Progress Notes (Signed)
[name], [name] ([id number]) Visit Report for 06/20/2020 Abuse/Suicide Risk Screen Details Patient Name: [name], [name]. Date of Service: 06/20/2020 12:45 PM Medical Record Number: [id number] Patient Account Number: [id number] Date of Birth/Sex: 12/14/1950 (68 y.o. F) Treating RN: Cornell Barman Primary Care Ladanian Kelter: Otilio Miu Other Clinician: Referring Aniruddh Ciavarella: Otilio Miu Treating Zayda Angell/Extender: STONE III, HOYT Weeks in Treatment: 0 Abuse/Suicide Risk Screen Items Answer ABUSE RISK SCREEN: Has anyone close to you tried to hurt or harm you recentlyo No Do you feel uncomfortable with anyone in your familyo No Has anyone forced you do things that you didnot want to doo No Electronic Signature(s) Signed: 06/20/2020 5:16:05 PM By: Gretta Cool, BSN, RN, CWS, Kim RN, BSN Entered By: Gretta Cool, BSN, RN, CWS, Kim on 06/20/2020 13:17:19 [name] ([id number]) -------------------------------------------------------------------------------- Activities of Daily Living Details Patient Name: [name], [name]. Date of Service: 06/20/2020 12:45 PM Medical Record Number: [id number] Patient Account Number: [id number] Date of Birth/Sex: [birth year] (68 y.o. F) Treating RN: Cornell Barman Primary Care Keenon Leitzel: Otilio Miu Other Clinician: Referring Artemisia Auvil: Otilio Miu Treating Lear Carstens/Extender: STONE III, HOYT Weeks in Treatment: 0 Activities of Daily Living Items Answer Activities of Daily Living (Please select one for each item) Drive Automobile Not Able Take Medications Completely Able Use Telephone Completely Able Care for Appearance Completely Able Use Toilet Completely Able Bath / Shower Completely Able Dress Self Completely Able Feed Self Completely Able Walk Completely Able Get In / Out Bed Completely Able Housework Completely Able Prepare Meals Completely Able Handle Money Completely Able Shop for Self Completely Able Electronic Signature(s) Signed: 06/20/2020  5:16:05 PM By: Gretta Cool, BSN, RN, CWS, Kim RN, BSN Entered By: Gretta Cool, BSN, RN, CWS, Kim on 06/20/2020 13:17:39 [name] ([id number]) -------------------------------------------------------------------------------- Education Screening Details Patient Name: [name]. Date of Service: 06/20/2020 12:45 PM Medical Record Number: [id number] Patient Account Number: [id number] Date of Birth/Sex: Apr 11, 1951 (68 y.o. F) Treating RN: Cornell Barman Primary Care Paddy Neis: Otilio Miu Other Clinician: Referring Brittanni Cariker: Otilio Miu Treating Teven Mittman/Extender: Melburn Hake, HOYT Weeks in Treatment: 0 Primary Learner Assessed: Patient Learning Preferences/Education Level/Primary Language Learning Preference: Explanation Highest Education Level: College or Above Preferred Language: English Cognitive Barrier Language Barrier: No Translator Needed: No Memory Deficit: No Emotional Barrier: No Cultural/Religious Beliefs Affecting Medical Care: No Physical Barrier Impaired Vision: Yes Legally Blind Impaired Hearing: No Decreased Hand dexterity: No Knowledge/Comprehension Knowledge Level: High Comprehension Level: High Ability to understand written instructions: High Ability to understand verbal instructions: High Motivation Anxiety Level: Calm Cooperation: Cooperative Education Importance: Acknowledges Need Interest in Health Problems: Asks Questions Perception: Coherent Willingness to Engage in Self-Management High Activities: Readiness to Engage in Self-Management High Activities: Electronic Signature(s) Signed: 06/20/2020 5:16:05 PM By: Gretta Cool, BSN, RN, CWS, Kim RN, BSN Entered By: Gretta Cool, BSN, RN, CWS, Kim on 06/20/2020 13:18:11 [name] ([id number]) -------------------------------------------------------------------------------- Fall Risk Assessment Details Patient Name: [name]. Date of Service: 06/20/2020 12:45 PM Medical Record Number:  [id number] Patient Account Number: [id number] Date of Birth/Sex: [birth year] (68 y.o. F) Treating RN: Cornell Barman Primary Care Danyelle Brookover: Otilio Miu Other Clinician: Referring Trashawn Oquendo: Otilio Miu Treating Whittley Carandang/Extender: Melburn Hake, HOYT Weeks in Treatment: 0 Fall Risk Assessment Items Have you had 2 or more falls in the last 12 monthso 0 No Have you had any fall that resulted in injury in the last 12 monthso 0 No FALLS RISK SCREEN History of falling - immediate or within 3 months 0 No Secondary diagnosis (Do you have 2 or more medical diagnoseso) 0 No Ambulatory aid  None/bed rest/wheelchair/nurse 0 Yes Crutches/cane/walker 0 No Furniture 0 No Intravenous therapy Access/Saline/Heparin Lock 0 No Gait/Transferring Normal/ bed rest/ wheelchair 0 Yes Weak (short steps with or without shuffle, stooped but able to lift head while walking, may 0 No seek support from furniture) Impaired (short steps with shuffle, may have difficulty arising from chair, head down, impaired 0 No balance) Mental Status Oriented to own ability 0 Yes Electronic Signature(s) Signed: 06/20/2020 5:16:05 PM By: Gretta Cool, BSN, RN, CWS, Kim RN, BSN Entered By: Gretta Cool, BSN, RN, CWS, Kim on 06/20/2020 13:18:23 [name] ([id number]) -------------------------------------------------------------------------------- Foot Assessment Details Patient Name: [name]. Date of Service: 06/20/2020 12:45 PM Medical Record Number: [id number] Patient Account Number: [id number] Date of Birth/Sex: 06-07-1951 (68 y.o. F) Treating RN: Cornell Barman Primary Care Divonte Senger: Otilio Miu Other Clinician: Referring Tyvion Edmondson: Otilio Miu Treating Willette Mudry/Extender: STONE III, HOYT Weeks in Treatment: 0 Foot Assessment Items Site Locations + = Sensation present, - = Sensation absent, C = Callus, U = Ulcer R = Redness, W = Warmth, M = Maceration, PU = Pre-ulcerative lesion F = Fissure, S = Swelling, D =  Dryness Assessment Right: Left: Other Deformity: No No Prior Foot Ulcer: No No Prior Amputation: No No Charcot Joint: No No Ambulatory Status: Ambulatory Without Help Gait: Steady Electronic Signature(s) Signed: 06/20/2020 5:16:05 PM By: Gretta Cool, BSN, RN, CWS, Kim RN, BSN Entered By: Gretta Cool, BSN, RN, CWS, Kim on 06/20/2020 13:19:25 [name] ([id number]) -------------------------------------------------------------------------------- Nutrition Risk Screening Details Patient Name: [name]. Date of Service: 06/20/2020 12:45 PM Medical Record Number: [id number] Patient Account Number: [id number] Date of Birth/Sex: 1951-10-25 (68 y.o. F) Treating RN: Cornell Barman Primary Care Dorathy Stallone: Otilio Miu Other Clinician: Referring Mykelle Cockerell: Otilio Miu Treating Ovadia Lopp/Extender: STONE III, HOYT Weeks in Treatment: 0 Height (in): 68 Weight (lbs): 184 Body Mass Index (BMI): 28 Nutrition Risk Screening Items Score Screening NUTRITION RISK SCREEN: I have an illness or condition that made me change the kind and/or amount of food I eat 0 No I eat fewer than two meals per day 0 No I eat few fruits and vegetables, or milk products 0 No I have three or more drinks of beer, liquor or wine almost every day 0 No I have tooth or mouth problems that make it hard for me to eat 0 No I don't always have enough money to buy the food I need 0 No I eat alone most of the time 0 No I take three or more different prescribed or over-the-counter drugs a day 0 No Without wanting to, I have lost or gained 10 pounds in the last six months 0 No I am not always physically able to shop, cook and/or feed myself 0 No Nutrition Protocols Good Risk Protocol 0 No interventions needed Moderate Risk Protocol High Risk Proctocol Risk Level: Good Risk Score: 0 Electronic Signature(s) Signed: 06/20/2020 5:16:05 PM By: Gretta Cool, BSN, RN, CWS, Kim RN, BSN Entered By: Gretta Cool, BSN, RN, CWS, Kim on 06/20/2020  13:19:10

## 2020-06-20 NOTE — Progress Notes (Signed)
SHIRELY, TOREN ([id number]) Visit Report for 06/20/2020 Chief Complaint Document Details Patient Name: [name], [name]. Date of Service: 06/20/2020 12:45 PM Medical Record Number: [id number] Patient Account Number: [id number] Date of Birth/Sex: 08-26-1951 (68 y.o. F) Treating RN: Cornell Barman Primary Care Provider: Otilio Miu Other Clinician: Referring Provider: Otilio Miu Treating Provider/Extender: Melburn Hake, Aiya Keach Weeks in Treatment: 0 Information Obtained from: Patient Chief Complaint Right LE Ulcer Electronic Signature(s) Signed: 06/20/2020 1:31:32 PM By: Worthy Keeler PA-C Entered By: Worthy Keeler on 06/20/2020 13:31:32 [name] ([id number]) -------------------------------------------------------------------------------- Debridement Details Patient Name: [name]. Date of Service: 06/20/2020 12:45 PM Medical Record Number: [id number] Patient Account Number: [id number] Date of Birth/Sex: 05/17/1951 (68 y.o. F) Treating RN: Cornell Barman Primary Care Provider: Otilio Miu Other Clinician: Referring Provider: Otilio Miu Treating Provider/Extender: STONE III, Francesca Strome Weeks in Treatment: 0 Debridement Performed for Wound #1 Right,Medial Lower Leg Assessment: Performed By: Physician STONE III, Bayne Fosnaugh E., PA-C Debridement Type: Debridement Level of Consciousness (Pre- Awake and Alert procedure): Pre-procedure Verification/Time Out Yes - 13:35 Taken: Pain Control: Lidocaine Total Area Debrided (L x W): 1 (cm) x 1.1 (cm) = 1.1 (cm) Tissue and other material Viable, Non-Viable, Slough, Subcutaneous, Slough debrided: Level: Skin/Subcutaneous Tissue Debridement Description: Excisional Instrument: Curette Bleeding: Moderate Hemostasis Achieved: Pressure Response to Treatment: Procedure was tolerated well Level of Consciousness (Post- Awake and Alert procedure): Post Debridement Measurements of Total Wound Length: (cm) 1 Width: (cm)  1.1 Depth: (cm) 0.5 Volume: (cm) 0.432 Character of Wound/Ulcer Post Debridement: Stable Post Procedure Diagnosis Same as Pre-procedure Electronic Signature(s) Signed: 06/20/2020 4:14:28 PM By: Worthy Keeler PA-C Signed: 06/20/2020 5:16:05 PM By: Gretta Cool, BSN, RN, CWS, Kim RN, BSN Entered By: Gretta Cool, BSN, RN, CWS, Kim on 06/20/2020 13:37:39 Huseby, Loran Senters ([id number]) -------------------------------------------------------------------------------- HPI Details Patient Name: [name]. Date of Service: 06/20/2020 12:45 PM Medical Record Number: [id number] Patient Account Number: [id number] Date of Birth/Sex: 01-Jul-1951 (68 y.o. F) Treating RN: Cornell Barman Primary Care Provider: Otilio Miu Other Clinician: Referring Provider: Otilio Miu Treating Provider/Extender: Melburn Hake, Lealon Vanputten Weeks in Treatment: 0 History of Present Illness HPI Description: 06/20/2020 upon evaluation today patient presents for initial evaluation here at clinic concerning wound on her right lower leg secondary to having struck this on a metal chair out in her yard in May 2021. She tells me that this has been managed at this point by her primary care provider Dr. Ronnald Ramp. With that being said Dr. Ronnald Ramp did make a referral to [place] to further evaluate this situation as well. The patient has been on doxycycline though she has not at this point and has been using the mupirocin as prescribed by Dr. Ronnald Ramp. She does have a history of venous insufficiency and has had some previous vascular work-up including an ablation secondary to a blood clot that she had in her leg. Subsequently she also has a slightly elevated ABI indicating likely some hardening of the arteries in general but nonetheless she seems to have good blood flow into the foot there is no evidence of cyanosis or poor capillary refill. The patient does have pain at the site of the injury. He tells me this did not bruise but nonetheless it seems to be  somewhat deep. Electronic Signature(s) Signed: 06/20/2020 2:05:37 PM By: Worthy Keeler PA-C Entered By: Worthy Keeler on 06/20/2020 14:05:37 [name] ([id number]) -------------------------------------------------------------------------------- Physical Exam Details Patient Name: [name], [name]. Date of Service: 06/20/2020 12:45 PM Medical Record Number: [id number] Patient Account Number:  [id number] Date of Birth/Sex: [birth year] (69 y.o. F) Treating RN: Cornell Barman Primary Care Provider: Otilio Miu Other Clinician: Referring Provider: Otilio Miu Treating Provider/Extender: STONE III, Dacoda Spallone Weeks in Treatment: 0 Constitutional patient is hypertensive.. pulse regular and within target range for patient.Marland Kitchen respirations regular, non-labored and within target range for patient.Marland Kitchen temperature within target range for patient.. Well-nourished and well-hydrated in no acute distress. Eyes conjunctiva clear no eyelid edema noted. pupils equal round and reactive to light and accommodation. Ears, Nose, Mouth, and Throat no gross abnormality of ear auricles or external auditory canals. normal hearing noted during conversation. mucus membranes moist. Respiratory normal breathing without difficulty. Cardiovascular 2+ dorsalis pedis/posterior tibialis pulses. no clubbing, cyanosis, significant edema, <3 sec cap refill. Musculoskeletal normal gait and posture. no significant deformity or arthritic changes, no loss or range of motion, no clubbing. Psychiatric this patient is able to make decisions and demonstrates good insight into disease process. Alert and Oriented x 3. pleasant and cooperative. Notes Upon inspection patient's wound bed actually did require some sharp debridement to clear away some of the necrotic debris. As I was debriding away the necrotic tissue it was obvious that the patient's wound actually does go much deeper than in originally was noted.  Subsequently however I do believe that she will likely heal quite nicely once we get this cleared away it feels the space with good dressing material such as a silver alginate in order to allow this to granulate in from the bottom out. This was described to the patient as the ideal way to heal and I think it is definitely the case at this point. Electronic Signature(s) Signed: 06/20/2020 2:06:22 PM By: Worthy Keeler PA-C Entered By: Worthy Keeler on 06/20/2020 14:06:22 [name] ([id number]) -------------------------------------------------------------------------------- Physician Orders Details Patient Name: [name], [name]. Date of Service: 06/20/2020 12:45 PM Medical Record Number: [id number] Patient Account Number: [id number] Date of Birth/Sex: June 18, 1951 (68 y.o. F) Treating RN: Cornell Barman Primary Care Provider: Otilio Miu Other Clinician: Referring Provider: Otilio Miu Treating Provider/Extender: Melburn Hake, Marshal Eskew Weeks in Treatment: 0 Verbal / Phone Orders: No Diagnosis Coding ICD-10 Coding Code Description S81.801A Unspecified open wound, right lower leg, initial encounter L97.812 Non-pressure chronic ulcer of other part of right lower leg with fat layer exposed I87.2 Venous insufficiency (chronic) (peripheral) I73.89 Other specified peripheral vascular diseases Wound Cleansing Wound #1 Right,Medial Lower Leg o Clean wound with Normal Saline. o Dial antibacterial soap, wash wounds, rinse and pat dry prior to dressing wounds Anesthetic (add to Medication List) Wound #1 Right,Medial Lower Leg o Topical Lidocaine 4% cream applied to wound bed prior to debridement (In Clinic Only). Primary Wound Dressing Wound #1 Right,Medial Lower Leg o Silver Alginate Secondary Dressing Wound #1 Right,Medial Lower Leg o [place] Dressing Change Frequency Wound #1 Right,Medial Lower Leg o Change dressing every other day. Follow-up Appointments Wound  #1 Right,Medial Lower Leg o Return Appointment in 1 week. Edema Control Wound #1 Right,Medial Lower Leg o Other: - Tubi-grip F Additional Orders / Instructions Wound #1 Right,Medial Lower Leg o Increase protein intake. Electronic Signature(s) Signed: 06/20/2020 4:14:28 PM By: Worthy Keeler PA-C Signed: 06/20/2020 5:16:05 PM By: Gretta Cool, BSN, RN, CWS, Kim RN, BSN Entered By: Gretta Cool, BSN, RN, CWS, Kim on 06/20/2020 13:39:33 [place], [place]ors[place] Loran Senters ([id number]) -------------------------------------------------------------------------------- Problem List Details Patient Name: [name], [name]. Date of Service: 06/20/2020 12:45 PM Medical Record Number: [id number] Patient Account Number: [id number] Date of Birth/Sex: Apr 05, 1951 (68 y.o. F) Treating RN: Cornell Barman Primary  Care Provider: Otilio Miu Other Clinician: Referring Provider: Otilio Miu Treating Provider/Extender: Melburn Hake, Dymir Neeson Weeks in Treatment: 0 Active Problems ICD-10 Encounter Code Description Active Date MDM Diagnosis S81.801A Unspecified open wound, right lower leg, initial encounter 06/20/2020 No Yes L97.812 Non-pressure chronic ulcer of other part of right lower leg with fat layer 06/20/2020 No Yes exposed I87.2 Venous insufficiency (chronic) (peripheral) 06/20/2020 No Yes I73.89 Other specified peripheral vascular diseases 06/20/2020 No Yes Inactive Problems Resolved Problems Electronic Signature(s) Signed: 06/20/2020 1:31:14 PM By: Worthy Keeler PA-C Entered By: Worthy Keeler on 06/20/2020 13:31:14 [name] ([id number]) -------------------------------------------------------------------------------- Progress Note Details Patient Name: [name]. Date of Service: 06/20/2020 12:45 PM Medical Record Number: [id number] Patient Account Number: [id number] Date of Birth/Sex: 1951/06/20 (68 y.o. F) Treating RN: Cornell Barman Primary Care Provider: Otilio Miu Other Clinician: Referring  Provider: Otilio Miu Treating Provider/Extender: Melburn Hake, Cori Justus Weeks in Treatment: 0 Subjective Chief Complaint Information obtained from Patient Right LE Ulcer History of Present Illness (HPI) 06/20/2020 upon evaluation today patient presents for initial evaluation here at clinic concerning wound on her right lower leg secondary to having struck this on a metal chair out in her yard in May 2021. She tells me that this has been managed at this point by her primary care provider Dr. Ronnald Ramp. With that being said Dr. Ronnald Ramp did make a referral to [place] to further evaluate this situation as well. The patient has been on doxycycline though she has not at this point and has been using the mupirocin as prescribed by Dr. Ronnald Ramp. She does have a history of venous insufficiency and has had some previous vascular work-up including an ablation secondary to a blood clot that she had in her leg. Subsequently she also has a slightly elevated ABI indicating likely some hardening of the arteries in general but nonetheless she seems to have good blood flow into the foot there is no evidence of cyanosis or poor capillary refill. The patient does have pain at the site of the injury. He tells me this did not bruise but nonetheless it seems to be somewhat deep. Patient History Information obtained from Patient. Allergies penicillin Family History Cancer - Mother, No family history of Diabetes, Heart Disease. Social History Never smoker, Marital Status - Married, Alcohol Use - Rarely, Drug Use - No History, Caffeine Use - Daily. Medical History Musculoskeletal Patient has history of Osteoarthritis Medical And Surgical History Notes Constitutional Symptoms (General Health) Antiphospholipid syndrome (APS) Review of Systems (ROS) Constitutional Symptoms (General Health) Denies complaints or symptoms of Fatigue, Fever, Chills, Marked Weight Change. Eyes Complains or has symptoms of Vision Changes - Stroke 2004  left side of both eyes. Ear/Nose/Mouth/Throat Denies complaints or symptoms of Difficult clearing ears, Sinusitis. Hematologic/Lymphatic Denies complaints or symptoms of Bleeding / Clotting Disorders, Human Immunodeficiency Virus. Respiratory Denies complaints or symptoms of Chronic or frequent coughs, Shortness of Breath. Cardiovascular Denies complaints or symptoms of Chest pain, LE edema. Gastrointestinal Denies complaints or symptoms of Frequent diarrhea, Nausea, Vomiting. Endocrine Denies complaints or symptoms of Hepatitis, Thyroid disease, Polydypsia (Excessive Thirst). Genitourinary Denies complaints or symptoms of Kidney failure/ Dialysis, Incontinence/dribbling. Immunological Denies complaints or symptoms of Hives, Itching. Integumentary (Skin) Complains or has symptoms of Wounds, Bleeding or bruising tendency. Musculoskeletal Denies complaints or symptoms of Muscle Pain, Muscle Weakness. Neurologic Denies complaints or symptoms of Numbness/parasthesias, Focal/Weakness. Psychiatric [name], [name] ([id number]) Denies complaints or symptoms of Anxiety, Claustrophobia. Objective Constitutional patient is hypertensive.. pulse regular and within target range for  patient.Marland Kitchen respirations regular, non-labored and within target range for patient.Marland Kitchen temperature within target range for patient.. Well-nourished and well-hydrated in no acute distress. Vitals Time Taken: 12:55 PM, Height: 68 in, Source: Stated, Weight: 184 lbs, Source: Measured, BMI: 28, Temperature: 98.4 F, Pulse: 82 bpm, Respiratory Rate: 18 breaths/min, Blood Pressure: 139/75 mmHg. Eyes conjunctiva clear no eyelid edema noted. pupils equal round and reactive to light and accommodation. Ears, Nose, Mouth, and Throat no gross abnormality of ear auricles or external auditory canals. normal hearing noted during conversation. mucus membranes moist. Respiratory normal breathing without  difficulty. Cardiovascular 2+ dorsalis pedis/posterior tibialis pulses. no clubbing, cyanosis, significant edema, Musculoskeletal normal gait and posture. no significant deformity or arthritic changes, no loss or range of motion, no clubbing. Psychiatric this patient is able to make decisions and demonstrates good insight into disease process. Alert and Oriented x 3. pleasant and cooperative. General Notes: Upon inspection patient's wound bed actually did require some sharp debridement to clear away some of the necrotic debris. As I was debriding away the necrotic tissue it was obvious that the patient's wound actually does go much deeper than in originally was noted. Subsequently however I do believe that she will likely heal quite nicely once we get this cleared away it feels the space with good dressing material such as a silver alginate in order to allow this to granulate in from the bottom out. This was described to the patient as the ideal way to heal and I think it is definitely the case at this point. Integumentary (Hair, Skin) Wound #1 status is Open. Original cause of wound was Trauma. The wound is located on the Right,Medial Lower Leg. The wound measures 1cm length x 1.1cm width x 0.1cm depth; 0.864cm^2 area and 0.086cm^3 volume. There is Fat Layer (Subcutaneous Tissue) Exposed exposed. There is no tunneling or undermining noted. There is a medium amount of serous drainage noted. The wound margin is flat and intact. There is medium (34-66%) pink granulation within the wound bed. There is a medium (34-66%) amount of necrotic tissue within the wound bed including Adherent Slough. Assessment Active Problems ICD-10 Unspecified open wound, right lower leg, initial encounter Non-pressure chronic ulcer of other part of right lower leg with fat layer exposed Venous insufficiency (chronic) (peripheral) Other specified peripheral vascular diseases Procedures Wound #1 Pre-procedure  diagnosis of Wound #1 is a Trauma, Other located on the Right,Medial Lower Leg . There was a Excisional Skin/Subcutaneous Tissue Debridement with a total area of 1.1 sq cm performed by STONE III, Marrissa Dai E., PA-C. With the following instrument(s): Curette to remove Viable and Non-Viable tissue/material. Material removed includes Subcutaneous Tissue and Slough and after achieving pain control using KATYA, ROLSTON. ([id number]) Lidocaine. No specimens were taken. A time out was conducted at 13:35, prior to the start of the procedure. A Moderate amount of bleeding was controlled with Pressure. The procedure was tolerated well. Post Debridement Measurements: 1cm length x 1.1cm width x 0.5cm depth; 0.432cm^3 volume. Character of Wound/Ulcer Post Debridement is stable. Post procedure Diagnosis Wound #1: Same as Pre-Procedure Plan Wound Cleansing: Wound #1 Right,Medial Lower Leg: Clean wound with Normal Saline. Dial antibacterial soap, wash wounds, rinse and pat dry prior to dressing wounds Anesthetic (add to Medication List): Wound #1 Right,Medial Lower Leg: Topical Lidocaine 4% cream applied to wound bed prior to debridement (In Clinic Only). Primary Wound Dressing: Wound #1 Right,Medial Lower Leg: Silver Alginate Secondary Dressing: Wound #1 Right,Medial Lower Leg: [place] Dressing Change Frequency: Wound #1  Right,Medial Lower Leg: Change dressing every other day. Follow-up Appointments: Wound #1 Right,Medial Lower Leg: Return Appointment in 1 week. Edema Control: Wound #1 Right,Medial Lower Leg: Other: - Tubi-grip F Additional Orders / Instructions: Wound #1 Right,Medial Lower Leg: Increase protein intake. [place] suggest currently that we go ahead and continue with the management of this wound she will change the dressing every other day we will however have her initiate treatment with silver alginate dressing which I think is good to be far superior to the mupirocin. 2.  We will cover this with a [place] dressing. 3. We will also have the patient change this every other day after showering and washing with Dial antibacterial soap. We will see patient back for reevaluation in 1 week here in the clinic. If anything worsens or changes patient will contact our office for additional recommendations. Electronic Signature(s) Signed: 06/20/2020 2:07:01 PM By: Worthy Keeler PA-C Entered By: Worthy Keeler on 06/20/2020 14:07:01 [name] ([id number]) -------------------------------------------------------------------------------- ROS/PFSH Details Patient Name: [name]. Date of Service: 06/20/2020 12:45 PM Medical Record Number: [id number] Patient Account Number: [id number] Date of Birth/Sex: Mar 28, 1951 (68 y.o. F) Treating RN: Cornell Barman Primary Care Provider: Otilio Miu Other Clinician: Referring Provider: Otilio Miu Treating Provider/Extender: STONE III, Kenidee Cregan Weeks in Treatment: 0 Information Obtained From Patient Constitutional Symptoms (General Health) Complaints and Symptoms: Negative for: Fatigue; Fever; Chills; Marked Weight Change Medical History: Past Medical History Notes: Antiphospholipid syndrome (APS) Eyes Complaints and Symptoms: Positive for: Vision Changes - Stroke 2004 left side of both eyes Ear/Nose/Mouth/Throat Complaints and Symptoms: Negative for: Difficult clearing ears; Sinusitis Hematologic/Lymphatic Complaints and Symptoms: Negative for: Bleeding / Clotting Disorders; Human Immunodeficiency Virus Respiratory Complaints and Symptoms: Negative for: Chronic or frequent coughs; Shortness of Breath Cardiovascular Complaints and Symptoms: Negative for: Chest pain; LE edema Gastrointestinal Complaints and Symptoms: Negative for: Frequent diarrhea; Nausea; Vomiting Endocrine Complaints and Symptoms: Negative for: Hepatitis; Thyroid disease; Polydypsia (Excessive Thirst) Genitourinary Complaints  and Symptoms: Negative for: Kidney failure/ Dialysis; Incontinence/dribbling Immunological Complaints and Symptoms: Negative for: Hives; Itching Integumentary (Skin) [name], [name] ([id number]) Complaints and Symptoms: Positive for: Wounds; Bleeding or bruising tendency Musculoskeletal Complaints and Symptoms: Negative for: Muscle Pain; Muscle Weakness Medical History: Positive for: Osteoarthritis Neurologic Complaints and Symptoms: Negative for: Numbness/parasthesias; Focal/Weakness Psychiatric Complaints and Symptoms: Negative for: Anxiety; Claustrophobia Oncologic Immunizations Pneumococcal Vaccine: Received Pneumococcal Vaccination: Yes Implantable Devices None Family and Social History Cancer: Yes - Mother; Diabetes: No; Heart Disease: No; Never smoker; Marital Status - Married; Alcohol Use: Rarely; Drug Use: No History; Caffeine Use: Daily Electronic Signature(s) Signed: 06/20/2020 4:14:28 PM By: Worthy Keeler PA-C Signed: 06/20/2020 5:16:05 PM By: Gretta Cool, BSN, RN, CWS, Kim RN, BSN Entered By: Gretta Cool, BSN, RN, CWS, Kim on 06/20/2020 13:17:12 [name] ([id number]) -------------------------------------------------------------------------------- SuperBill Details Patient Name: [name]. Date of Service: 06/20/2020 Medical Record Number: [id number] Patient Account Number: [id number] Date of Birth/Sex: 1951-08-01 (68 y.o. F) Treating RN: Cornell Barman Primary Care Provider: Otilio Miu Other Clinician: Referring Provider: Otilio Miu Treating Provider/Extender: Melburn Hake, Abrianna Sidman Weeks in Treatment: 0 Diagnosis Coding ICD-10 Codes Code Description S81.801A Unspecified open wound, right lower leg, initial encounter L97.812 Non-pressure chronic ulcer of other part of right lower leg with fat layer exposed I87.2 Venous insufficiency (chronic) (peripheral) I73.89 Other specified peripheral vascular diseases Facility Procedures CPT4 Code:  70488891 Description: 69450 - WOUND CARE VISIT-LEV 3 EST PT Modifier: Quantity: 1 CPT4 Code: [id number] Description: 34917 - DEB SUBQ TISSUE 20  SQ CM/< Modifier: Quantity: 1 CPT4 Code: Description: ICD-10 Diagnosis Description L97.812 Non-pressure chronic ulcer of other part of right lower leg with fat laye Modifier: r exposed Quantity: Physician Procedures CPT4 Code: [id number] Description: 03009 - WC PHYS LEVEL 4 - NEW PT Modifier: 25 Quantity: 1 CPT4 Code: Description: ICD-10 Diagnosis Description S81.801A Unspecified open wound, right lower leg, initial encounter L97.812 Non-pressure chronic ulcer of other part of right lower leg with fat lay I87.2 Venous insufficiency (chronic) (peripheral) I73.89 Other  specified peripheral vascular diseases Modifier: er exposed Quantity: CPT4 Code: 2330076 Description: 22633 - WC PHYS SUBQ TISS 20 SQ CM Modifier: Quantity: 1 CPT4 Code: Description: ICD-10 Diagnosis Description H54.562 Non-pressure chronic ulcer of other part of right lower leg with fat lay Modifier: er exposed Quantity: Electronic Signature(s) Signed: 06/20/2020 2:07:24 PM By: Worthy Keeler PA-C Entered By: Worthy Keeler on 06/20/2020 14:07:23

## 2020-06-20 NOTE — Telephone Encounter (Signed)
Requested medication (s) are due for refill today:   Yes  Requested medication (s) are on the active medication list:   Yes  Future visit scheduled:   No   Last ordered: Returned because this is a non delegated refill per the protocol.   Requested Prescriptions  Pending Prescriptions Disp Refills   warfarin (COUMADIN) 3 MG tablet [Pharmacy Med Name: WARFARIN SODIUM 3 MG TABLET] 90 tablet 1    Sig: TAKE 1 TABLET BY MOUTH DAILY      Hematology:  Anticoagulants - warfarin Failed - 06/20/2020  1:32 AM      Failed - This refill cannot be delegated      Failed - If the patient is managed by [facility] - route to their Pool. If not, forward to the provider.      Failed - INR in normal range and within 30 days    INR  Date Value Ref Range Status  05/23/2020 1.9 (H) 0.9 - 1.2 Final    Comment:    Reference interval is for non-anticoagulated patients. Suggested INR therapeutic range for Vitamin K antagonist therapy:    Standard Dose (moderate intensity                   therapeutic range):       2.0 - 3.0    Higher intensity therapeutic range       2.5 - 3.5   04/23/2012 1.5  Final    Comment:    INR reference interval applies to patients on anticoagulant therapy. A single INR therapeutic range for coumarins is not optimal for all indications; however, the suggested range for most indications is 2.0 - 3.0. Exceptions to the INR Reference Range may include: Prosthetic heart valves, acute myocardial infarction, prevention of myocardial infarction, and combinations of aspirin and anticoagulant. The need for a higher or lower target INR must be assessed individually. Reference: The Pharmacology and Management of the Vitamin K  antagonists: the seventh ACCP Conference on Antithrombotic and Thrombolytic Therapy. AYTKZ.6010 Sept:126 (3suppl): N9146842. A HCT value >55% may artifactually increase the PT.  In one study,  the increase was an average of 25%. Reference:  "Effect on  Routine and Special Coagulation Testing Values of Citrate Anticoagulant Adjustment in Patients with High HCT Values." American Journal of Clinical Pathology 2006;126:400-405.           Passed - Valid encounter within last 3 months    Recent Outpatient Visits           2 weeks ago Mild memory disturbance   [facility] Juline Patch, MD   4 weeks ago Cellulitis of right lower extremity   [facility] Juline Patch, MD   1 month ago Anticoagulant long-term use   [facility] Juline Patch, MD   1 month ago Cellulitis of right lower extremity   [facility] Juline Patch, MD   4 months ago Taking medication for chronic disease   [facility] Juline Patch, MD

## 2020-06-22 ENCOUNTER — Other Ambulatory Visit: Payer: Self-pay | Admitting: Family Medicine

## 2020-06-22 DIAGNOSIS — F3342 Major depressive disorder, recurrent, in full remission: Secondary | ICD-10-CM

## 2020-06-28 ENCOUNTER — Ambulatory Visit: Payer: Medicare Other | Admitting: Physician Assistant

## 2020-06-29 DIAGNOSIS — R413 Other amnesia: Secondary | ICD-10-CM | POA: Diagnosis not present

## 2020-06-29 DIAGNOSIS — Z8673 Personal history of transient ischemic attack (TIA), and cerebral infarction without residual deficits: Secondary | ICD-10-CM | POA: Diagnosis not present

## 2020-07-04 DIAGNOSIS — R413 Other amnesia: Secondary | ICD-10-CM | POA: Insufficient documentation

## 2020-07-04 DIAGNOSIS — Z8673 Personal history of transient ischemic attack (TIA), and cerebral infarction without residual deficits: Secondary | ICD-10-CM | POA: Insufficient documentation

## 2020-07-05 ENCOUNTER — Other Ambulatory Visit: Payer: Self-pay

## 2020-07-05 ENCOUNTER — Other Ambulatory Visit
Admission: RE | Admit: 2020-07-05 | Discharge: 2020-07-05 | Disposition: A | Discharge status: Home / Self Care | Payer: Medicare Other | Source: Ambulatory Visit | Attending: *Deleted | Admitting: *Deleted

## 2020-07-05 ENCOUNTER — Encounter: Payer: Medicare Other | Attending: Physician Assistant | Admitting: Physician Assistant

## 2020-07-05 DIAGNOSIS — I872 Venous insufficiency (chronic) (peripheral): Secondary | ICD-10-CM | POA: Diagnosis not present

## 2020-07-05 DIAGNOSIS — L97812 Non-pressure chronic ulcer of other part of right lower leg with fat layer exposed: Secondary | ICD-10-CM | POA: Insufficient documentation

## 2020-07-05 DIAGNOSIS — S81801A Unspecified open wound, right lower leg, initial encounter: Secondary | ICD-10-CM | POA: Diagnosis not present

## 2020-07-05 NOTE — Progress Notes (Addendum)
Kathryn, Le ([id number]) Visit Report for 07/05/2020 Chief Complaint Document Details Patient Name: [name], [name]. Date of Service: 07/05/2020 10:45 AM Medical Record Number: [id number] Patient Account Number: [id number] Date of Birth/Sex: 09-03-1951 (68 y.o. F) Treating RN: Cornell Barman Primary Care Provider: Otilio Miu Other Clinician: Referring Provider: Otilio Miu Treating Provider/Extender: Melburn Hake, Jerico Grisso Weeks in Treatment: 2 Information Obtained from: Patient Chief Complaint Right LE Ulcer Electronic Signature(s) Signed: 07/05/2020 10:51:38 AM By: Worthy Keeler PA-C Entered By: Worthy Keeler on 07/05/2020 10:51:37 [name] ([id number]) -------------------------------------------------------------------------------- Debridement Details Patient Name: [name]. Date of Service: 07/05/2020 10:45 AM Medical Record Number: [id number] Patient Account Number: [id number] Date of Birth/Sex: 10-Dec-1950 (68 y.o. F) Treating RN: Cornell Barman Primary Care Provider: Otilio Miu Other Clinician: Referring Provider: Otilio Miu Treating Provider/Extender: Melburn Hake, Zeeva Courser Weeks in Treatment: 2 Debridement Performed for Wound #1 Right,Medial Lower Leg Assessment: Performed By: Physician STONE III, Rodrick Payson E., PA-C Debridement Type: Chemical/Enzymatic/Mechanical Agent Used: Santyl Level of Consciousness (Pre- Awake and Alert procedure): Pre-procedure Verification/Time Out Yes - 11:01 Taken: Start Time: 11:01 Pain Control: Lidocaine Instrument: Other : tongue blade Bleeding: None Response to Treatment: Procedure was tolerated well Level of Consciousness (Post- Awake and Alert procedure): Post Debridement Measurements of Total Wound Length: (cm) 1.4 Width: (cm) 1.2 Depth: (cm) 0.4 Volume: (cm) 0.528 Character of Wound/Ulcer Post Debridement: Requires Further Debridement Post Procedure Diagnosis Same as Pre-procedure Electronic  Signature(s) Signed: 07/05/2020 6:15:25 PM By: Worthy Keeler PA-C Signed: 07/05/2020 7:33:04 PM By: Gretta Cool, BSN, RN, CWS, Kim RN, BSN Entered By: Gretta Cool, BSN, RN, CWS, Kim on 07/05/2020 11:02:28 [name] ([id number]) -------------------------------------------------------------------------------- HPI Details Patient Name: [name]. Date of Service: 07/05/2020 10:45 AM Medical Record Number: [id number] Patient Account Number: [id number] Date of Birth/Sex: [birth year] (68 y.o. F) Treating RN: Cornell Barman Primary Care Provider: Otilio Miu Other Clinician: Referring Provider: Otilio Miu Treating Provider/Extender: Melburn Hake, Jacorie Ernsberger Weeks in Treatment: 2 History of Present Illness HPI Description: 06/20/2020 upon evaluation today patient presents for initial evaluation here at clinic concerning wound on her right lower leg secondary to having struck this on a metal chair out in her yard in May 2021. She tells me that this has been managed at this point by her primary care provider Dr. Ronnald Ramp. With that being said Dr. Ronnald Ramp did make a referral to [place] to further evaluate this situation as well. The patient has been on doxycycline though she has not at this point and has been using the mupirocin as prescribed by Dr. Ronnald Ramp. She does have a history of venous insufficiency and has had some previous vascular work-up including an ablation secondary to a blood clot that she had in her leg. Subsequently she also has a slightly elevated ABI indicating likely some hardening of the arteries in general but nonetheless she seems to have good blood flow into the foot there is no evidence of cyanosis or poor capillary refill. The patient does have pain at the site of the injury. He tells me this did not bruise but nonetheless it seems to be somewhat deep. 07/05/2020 upon evaluation today patient unfortunately does not appear to be doing as well as she was previous. There is no signs of  active infection at this time which is good news I really felt like after the debridement we performed last time she would be doing much better but she really is not. She still has a lot of necrotic tissue is very tender and actually  has some erythema around the wound. Overall I am concerned that things do not seem to be improving quite as effectively and as much as I would like to see. Electronic Signature(s) Signed: 07/05/2020 11:02:08 AM By: Worthy Keeler PA-C Entered By: Worthy Keeler on 07/05/2020 11:02:08 [name] ([id number]) -------------------------------------------------------------------------------- Physical Exam Details Patient Name: Kathryn, Le. Date of Service: 07/05/2020 10:45 AM Medical Record Number: [id number] Patient Account Number: [id number] Date of Birth/Sex: 1951-04-03 (68 y.o. F) Treating RN: Cornell Barman Primary Care Provider: Otilio Miu Other Clinician: Referring Provider: Otilio Miu Treating Provider/Extender: STONE III, [place] Weeks in Treatment: 2 Constitutional Well-nourished and well-hydrated in no acute distress. Respiratory normal breathing without difficulty. Psychiatric this patient is able to make decisions and demonstrates good insight into disease process. Alert and Oriented x 3. pleasant and cooperative. Notes Upon inspection patient's wound bed showed signs of not really having a good granular bed at this point. I am concerned about the possibility of infection I did actually obtain a wound culture today we will see what that shows. Otherwise we will look into getting her Santyl I am to send that into the pharmacy today I do believe that could be of benefit for her as well. I explained how this can either wait some of the dead tissue to help clean up the wound bed and get it set a better point as far as healing is concerned. She is in agreement with that plan currently. Depending on the results of the culture obtained today  it may also place her on an antibiotic. Electronic Signature(s) Signed: 07/05/2020 11:03:07 AM By: Worthy Keeler PA-C Entered By: Worthy Keeler on 07/05/2020 11:03:06 [name] ([id number]) -------------------------------------------------------------------------------- Physician Orders Details Patient Name: LESTA, LIMBERT. Date of Service: 07/05/2020 10:45 AM Medical Record Number: [id number] Patient Account Number: [id number] Date of Birth/Sex: 1951/02/14 (68 y.o. F) Treating RN: Cornell Barman Primary Care Provider: Otilio Miu Other Clinician: Referring Provider: Otilio Miu Treating Provider/Extender: Melburn Hake, Sabino Denning Weeks in Treatment: 2 Verbal / Phone Orders: No Diagnosis Coding ICD-10 Coding Code Description S81.801A Unspecified open wound, right lower leg, initial encounter L97.812 Non-pressure chronic ulcer of other part of right lower leg with fat layer exposed I87.2 Venous insufficiency (chronic) (peripheral) I73.89 Other specified peripheral vascular diseases Wound Cleansing Wound #1 Right,Medial Lower Leg o Clean wound with Normal Saline. o Dial antibacterial soap, wash wounds, rinse and pat dry prior to dressing wounds Anesthetic (add to Medication List) Wound #1 Right,Medial Lower Leg o Topical Lidocaine 4% cream applied to wound bed prior to debridement (In Clinic Only). Primary Wound Dressing Wound #1 Right,Medial Lower Leg o Santyl Ointment - saline moistened gauze Secondary Dressing Wound #1 Right,Medial Lower Leg o ABD and Kerlix/Conform Dressing Change Frequency Wound #1 Right,Medial Lower Leg o Change dressing every day. Follow-up Appointments Wound #1 Right,Medial Lower Leg o Return Appointment in 1 week. Edema Control Wound #1 Right,Medial Lower Leg o Other: - Tubi-grip F wear daily for swelling Additional Orders / Instructions Wound #1 Right,Medial Lower Leg o Increase protein intake. Laboratory o Bacteria  identified in Wound by Culture (MICRO) - right lower leg oooo LOINC Code: 5374-8 oooo Convenience Name: Wound culture routine JIAH, BARI ([id number]) Patient Medications Allergies: penicillin Notifications Medication Indication Start End Santyl 07/05/2020 DOSE topical 250 unit/gram ointment - ointment topical Apply nickel thick daily to the wound bed and then cover with a dressing as directed in clinic clindamycin HCl 07/12/2020 DOSE 1 - oral 300  mg capsule - 1 capsule oral taken 2 times per day for 14 days Electronic Signature(s) Signed: 07/12/2020 11:51:35 AM By: Worthy Keeler PA-C Previous Signature: 07/05/2020 11:05:28 AM Version By: Worthy Keeler PA-C Entered By: Worthy Keeler on 07/12/2020 11:51:34 [name] ([id number]) -------------------------------------------------------------------------------- Problem List Details Patient Name: [name]. Date of Service: 07/05/2020 10:45 AM Medical Record Number: [id number] Patient Account Number: [id number] Date of Birth/Sex: Apr 04, 1951 (68 y.o. F) Treating RN: Cornell Barman Primary Care Provider: Otilio Miu Other Clinician: Referring Provider: Otilio Miu Treating Provider/Extender: Melburn Hake, [place] Weeks in Treatment: 2 Active Problems ICD-10 Encounter Code Description Active Date MDM Diagnosis S81.801A Unspecified open wound, right lower leg, initial encounter 06/20/2020 No Yes L97.812 Non-pressure chronic ulcer of other part of right lower leg with fat layer 06/20/2020 No Yes exposed I87.2 Venous insufficiency (chronic) (peripheral) 06/20/2020 No Yes I73.89 Other specified peripheral vascular diseases 06/20/2020 No Yes Inactive Problems Resolved Problems Electronic Signature(s) Signed: 07/05/2020 10:51:29 AM By: Worthy Keeler PA-C Entered By: Worthy Keeler on 07/05/2020 10:51:28 [name]  ([id number]) -------------------------------------------------------------------------------- Progress Note Details Patient Name: [name]. Date of Service: 07/05/2020 10:45 AM Medical Record Number: [id number] Patient Account Number: [id number] Date of Birth/Sex: 01/18/1951 (68 y.o. F) Treating RN: Cornell Barman Primary Care Provider: Otilio Miu Other Clinician: Referring Provider: Otilio Miu Treating Provider/Extender: Melburn Hake, Emmitt Matthews Weeks in Treatment: 2 Subjective Chief Complaint Information obtained from Patient Right LE Ulcer History of Present Illness (HPI) 06/20/2020 upon evaluation today patient presents for initial evaluation here at clinic concerning wound on her right lower leg secondary to having struck this on a metal chair out in her yard in May 2021. She tells me that this has been managed at this point by her primary care provider Dr. Ronnald Ramp. With that being said Dr. Ronnald Ramp did make a referral to [place] to further evaluate this situation as well. The patient has been on doxycycline though she has not at this point and has been using the mupirocin as prescribed by Dr. Ronnald Ramp. She does have a history of venous insufficiency and has had some previous vascular work-up including an ablation secondary to a blood clot that she had in her leg. Subsequently she also has a slightly elevated ABI indicating likely some hardening of the arteries in general but nonetheless she seems to have good blood flow into the foot there is no evidence of cyanosis or poor capillary refill. The patient does have pain at the site of the injury. He tells me this did not bruise but nonetheless it seems to be somewhat deep. 07/05/2020 upon evaluation today patient unfortunately does not appear to be doing as well as she was previous. There is no signs of active infection at this time which is good news I really felt like after the debridement we performed last time she would be doing much better but  she really is not. She still has a lot of necrotic tissue is very tender and actually has some erythema around the wound. Overall I am concerned that things do not seem to be improving quite as effectively and as much as I would like to see. Objective Constitutional Well-nourished and well-hydrated in no acute distress. Vitals Time Taken: 10:34 AM, Height: 68 in, Weight: 184 lbs, BMI: 28, Temperature: 98.4 F, Pulse: 72 bpm, Respiratory Rate: 18 breaths/min, Blood Pressure: 142/79 mmHg. Respiratory normal breathing without difficulty. Psychiatric this patient is able to make decisions and demonstrates good insight into disease process.  Alert and Oriented x 3. pleasant and cooperative. General Notes: Upon inspection patient's wound bed showed signs of not really having a good granular bed at this point. I am concerned about the possibility of infection I did actually obtain a wound culture today we will see what that shows. Otherwise we will look into getting her Santyl I am to send that into the pharmacy today I do believe that could be of benefit for her as well. I explained how this can either wait some of the dead tissue to help clean up the wound bed and get it set a better point as far as healing is concerned. She is in agreement with that plan currently. Depending on the results of the culture obtained today it may also place her on an antibiotic. Integumentary (Hair, Skin) Wound #1 status is Open. Original cause of wound was Trauma. The wound is located on the Right,Medial Lower Leg. The wound measures 1.4cm length x 1.2cm width x 0.4cm depth; 1.319cm^2 area and 0.528cm^3 volume. There is Fat Layer (Subcutaneous Tissue) Exposed exposed. There is no tunneling or undermining noted. There is a medium amount of serosanguineous drainage noted. The wound margin is flat and intact. There is medium (34-66%) pink granulation within the wound bed. There is a medium (34-66%) amount of necrotic  tissue within the wound bed including Adherent Slough. Assessment Active Problems GREGORIA, SELVY ([id number]) ICD-10 Unspecified open wound, right lower leg, initial encounter Non-pressure chronic ulcer of other part of right lower leg with fat layer exposed Venous insufficiency (chronic) (peripheral) Other specified peripheral vascular diseases Procedures Wound #1 Pre-procedure diagnosis of Wound #1 is a Trauma, Other located on the Right,Medial Lower Leg . There was a Chemical/Enzymatic/Mechanical debridement performed by STONE III, Ruthella Kirchman E., PA-C. With the following instrument(s): tongue blade after achieving pain control using Lidocaine. Agent used was [employer]. A time out was conducted at 11:01, prior to the start of the procedure. There was no bleeding. The procedure was tolerated well. Post Debridement Measurements: 1.4cm length x 1.2cm width x 0.4cm depth; 0.528cm^3 volume. Character of Wound/Ulcer Post Debridement requires further debridement. Post procedure Diagnosis Wound #1: Same as Pre-Procedure Plan Wound Cleansing: Wound #1 Right,Medial Lower Leg: Clean wound with Normal Saline. Dial antibacterial soap, wash wounds, rinse and pat dry prior to dressing wounds Anesthetic (add to Medication List): Wound #1 Right,Medial Lower Leg: Topical Lidocaine 4% cream applied to wound bed prior to debridement (In Clinic Only). Primary Wound Dressing: Wound #1 Right,Medial Lower Leg: Santyl Ointment - saline moistened gauze Secondary Dressing: Wound #1 Right,Medial Lower Leg: ABD and Kerlix/Conform Dressing Change Frequency: Wound #1 Right,Medial Lower Leg: Change dressing every day. Follow-up Appointments: Wound #1 Right,Medial Lower Leg: Return Appointment in 1 week. Edema Control: Wound #1 Right,Medial Lower Leg: Other: - Tubi-grip F wear daily for swelling Additional Orders / Instructions: Wound #1 Right,Medial Lower Leg: Increase protein intake. Laboratory ordered  were: Wound culture routine - right lower leg The following medication(s) was prescribed: Santyl topical 250 unit/gram ointment ointment topical Apply nickel thick daily to the wound bed and then cover with a dressing as directed in clinic starting 07/05/2020 clindamycin HCl oral 300 mg capsule 1 1 capsule oral taken 2 times per day for 14 days starting 07/12/2020 1. I would recommend currently that we go ahead and continue with the wound care measures with regard to the Tubigrip I think that would be helpful to keep her swelling under control. 2. I am also can recommend that we  switch to Santyl currently I am going to send that to the mail order pharmacy for her. 3. I am also can recommend that we continue to cover this with a slight saline moistened gauze followed by an ABD pad and con form and then of course the Tubigrip over top of all. We will see patient back for reevaluation in 1 week here in the clinic. If anything worsens or changes patient will contact our office for additional recommendations. Electronic Signature(s) Signed: 07/12/2020 11:56:18 AM By: Worthy Keeler PA-C Previous Signature: 07/05/2020 11:06:05 AM Version By: Irean Hong Bolt, [place] ([id number]) Entered By: Worthy Keeler on 07/12/2020 11:56:17 [name] ([id number]) -------------------------------------------------------------------------------- SuperBill Details Patient Name: KASUMI, DITULLIO. Date of Service: 07/05/2020 Medical Record Number: [id number] Patient Account Number: [id number] Date of Birth/Sex: 05-16-1951 (68 y.o. F) Treating RN: Cornell Barman Primary Care Provider: Otilio Miu Other Clinician: Referring Provider: Otilio Miu Treating Provider/Extender: Melburn Hake, [place] Weeks in Treatment: 2 Diagnosis Coding ICD-10 Codes Code Description [phone] Unspecified open wound, right lower leg, initial encounter L97.812 Non-pressure chronic ulcer of other part of right lower  leg with fat layer exposed I87.2 Venous insufficiency (chronic) (peripheral) I73.89 Other specified peripheral vascular diseases Facility Procedures CPT4 Code: [id number] Description: [phone] - DEBRIDE W/O ANES NON SELECT Modifier: Quantity: 1 Physician Procedures CPT4 Code: [id number] Description: 67737 - WC PHYS LEVEL 4 - EST PT Modifier: Quantity: 1 CPT4 Code: Description: ICD-10 Diagnosis Description S81.801A Unspecified open wound, right lower leg, initial encounter L97.812 Non-pressure chronic ulcer of other part of right lower leg with fat la I87.2 Venous insufficiency (chronic) (peripheral) I73.89 Other  specified peripheral vascular diseases Modifier: yer exposed Quantity: Electronic Signature(s) Signed: 07/05/2020 11:06:25 AM By: Worthy Keeler PA-C Entered By: Worthy Keeler on 07/05/2020 11:06:24

## 2020-07-06 NOTE — Progress Notes (Signed)
DEYJA, SOCHACKI ([id number]) Visit Report for 07/05/2020 Arrival Information Details Patient Name: [name], [name]. Date of Service: 07/05/2020 10:45 AM Medical Record Number: [id number] Patient Account Number: [id number] Date of Birth/Sex: [birth year] (69 y.o. F) Treating RN: Cornell Barman Primary Care Lyndle Pang: Otilio Miu Other Clinician: Referring Aalijah Mims: Otilio Miu Treating Devontaye Ground/Extender: Melburn Hake, HOYT Weeks in Treatment: 2 Visit Information History Since Last Visit Added or deleted any medications: No Patient Arrived: Ambulatory Any new allergies or adverse reactions: No Arrival Time: 10:33 Had a fall or experienced change in No Accompanied By: alone activities of daily living that may affect Transfer Assistance: None risk of falls: Patient Identification Verified: Yes Signs or symptoms of abuse/neglect since last visito No Secondary Verification Process Completed: Yes Hospitalized since last visit: No Patient Requires Transmission-Based No Implantable device outside of the clinic excluding No Precautions: cellular tissue based products placed in the center Patient Has Alerts: Yes since last visit: Patient Alerts: Patient on Blood Has Dressing in Place as Prescribed: Yes Thinner Pain Present Now: No Warfarin Electronic Signature(s) Signed: 07/05/2020 5:13:06 PM By: Levan Hurst RN, BSN Entered By: Levan Hurst on 07/05/2020 10:34:16 [name] ([id number]) -------------------------------------------------------------------------------- Encounter Discharge Information Details Patient Name: [name]. Date of Service: 07/05/2020 10:45 AM Medical Record Number: [id number] Patient Account Number: [id number] Date of Birth/Sex: Feb 10, 1951 (69 y.o. F) Treating RN: Cornell Barman Primary Care Maurisio Ruddy: Otilio Miu Other Clinician: Referring Raileigh Sabater: Otilio Miu Treating Adelaida Reindel/Extender: Melburn Hake, HOYT Weeks in Treatment: 2 Encounter  Discharge Information Items Post Procedure Vitals Discharge Condition: Stable Temperature (F): 98.4 Ambulatory Status: Ambulatory Pulse (bpm): 72 Discharge Destination: Home Respiratory Rate (breaths/min): 18 Transportation: Private Auto Blood Pressure (mmHg): 142/79 Accompanied By: self Schedule Follow-up Appointment: No Clinical Summary of Care: Electronic Signature(s) Signed: 07/05/2020 7:33:04 PM By: Gretta Cool, BSN, RN, CWS, Kim RN, BSN Entered By: Gretta Cool, BSN, RN, CWS, Kim on 07/05/2020 11:04:24 [name] ([id number]) -------------------------------------------------------------------------------- Lower Extremity Assessment Details Patient Name: [name]. Date of Service: 07/05/2020 10:45 AM Medical Record Number: [id number] Patient Account Number: [id number] Date of Birth/Sex: [birth year] (69 y.o. F) Treating RN: Cornell Barman Primary Care Daziyah Cogan: Otilio Miu Other Clinician: Referring Kristyanna Barcelo: Otilio Miu Treating Talayeh Bruinsma/Extender: STONE III, HOYT Weeks in Treatment: 2 Edema Assessment Assessed: [Left: No] [Right: No] [Left: Edema] [Right: :] Calf Left: Right: Point of Measurement: cm From Medial Instep cm 36.5 cm Ankle Left: Right: Point of Measurement: cm From Medial Instep cm 20.5 cm Vascular Assessment Pulses: Dorsalis Pedis Palpable: [Right:Yes] Electronic Signature(s) Signed: 07/05/2020 5:13:06 PM By: Levan Hurst RN, BSN Signed: 07/05/2020 7:33:04 PM By: Gretta Cool, BSN, RN, CWS, Kim RN, BSN Entered By: Levan Hurst on 07/05/2020 10:45:57 [name] ([id number]) -------------------------------------------------------------------------------- Multi Wound Chart Details Patient Name: [name]. Date of Service: 07/05/2020 10:45 AM Medical Record Number: [id number] Patient Account Number: [id number] Date of Birth/Sex: 1951/11/20 (69 y.o. F) Treating RN: Cornell Barman Primary Care Majestic Brister: Otilio Miu Other Clinician: Referring  Makye Radle: Otilio Miu Treating Taje Littler/Extender: STONE III, HOYT Weeks in Treatment: 2 Vital Signs Height(in): 68 Pulse(bpm): 72 Weight(lbs): 184 Blood Pressure(mmHg): 142/79 Body Mass Index(BMI): 28 Temperature(F): 98.4 Respiratory Rate(breaths/min): 18 Photos: [N/A:N/A] Wound Location: Right, Medial Lower Leg N/A N/A Wounding Event: Trauma N/A N/A Primary Etiology: Trauma, Other N/A N/A Comorbid History: Osteoarthritis N/A N/A Date Acquired: 04/02/2020 N/A N/A Weeks of Treatment: 2 N/A N/A Wound Status: Open N/A N/A Measurements L x W x D (cm) 1.4x1.2x0.4 N/A N/A Area (cm) : 1.319 N/A N/A Volume (cm) : 0.528  N/A N/A % Reduction in Area: -52.70% N/A N/A % Reduction in Volume: -514.00% N/A N/A Classification: Full Thickness Without Exposed N/A N/A Support Structures Exudate Amount: Medium N/A N/A Exudate Type: Serosanguineous N/A N/A Exudate Color: red, brown N/A N/A Wound Margin: Flat and Intact N/A N/A Granulation Amount: Medium (34-66%) N/A N/A Granulation Quality: Pink N/A N/A Necrotic Amount: Medium (34-66%) N/A N/A Exposed Structures: Fat Layer (Subcutaneous Tissue) N/A N/A Exposed: Yes Epithelialization: None N/A N/A Treatment Notes Electronic Signature(s) Signed: 07/05/2020 7:33:04 PM By: Gretta Cool, BSN, RN, CWS, Kim RN, BSN Entered By: Gretta Cool, BSN, RN, CWS, Kim on 07/05/2020 10:55:51 [name] ([id number]) -------------------------------------------------------------------------------- [place] Details Patient Name: [name], [name]. Date of Service: 07/05/2020 10:45 AM Medical Record Number: [id number] Patient Account Number: [id number] Date of Birth/Sex: [birth year] (69 y.o. F) Treating RN: Cornell Barman Primary Care Faith Patricelli: Otilio Miu Other Clinician: Referring Kingsly Kloepfer: Otilio Miu Treating Consuelo Thayne/Extender: Melburn Hake, HOYT Weeks in Treatment: 2 Active Inactive Necrotic Tissue Nursing Diagnoses: Impaired tissue  integrity related to necrotic/devitalized tissue Goals: Necrotic/devitalized tissue will be minimized in the wound bed Date Initiated: 06/20/2020 Target Resolution Date: 06/27/2020 Goal Status: Active Interventions: Assess patient pain level pre-, during and post procedure and prior to discharge Treatment Activities: Apply topical anesthetic as ordered : 06/20/2020 Excisional debridement : 06/20/2020 Notes: Orientation to the Wound Care Program Nursing Diagnoses: Knowledge deficit related to the wound healing center program Goals: Patient/caregiver will verbalize understanding of the [place] Date Initiated: 06/20/2020 Target Resolution Date: 06/27/2020 Goal Status: Active Interventions: Provide education on orientation to the wound center Notes: Wound/Skin Impairment Nursing Diagnoses: Impaired tissue integrity Goals: Ulcer/skin breakdown will have a volume reduction of 30% by week 4 Date Initiated: 06/20/2020 Target Resolution Date: 07/21/2020 Goal Status: Active Interventions: Assess patient/caregiver ability to obtain necessary supplies Assess ulceration(s) every visit Treatment Activities: Skin care regimen initiated : 06/20/2020 Notes: Electronic Signature(s) RETAJ, HILBUN ([id number]) Signed: 07/05/2020 7:33:04 PM By: Gretta Cool, BSN, RN, CWS, Kim RN, BSN Entered By: Gretta Cool, BSN, RN, CWS, Kim on 07/05/2020 10:55:40 [name] ([id number]) -------------------------------------------------------------------------------- Pain Assessment Details Patient Name: [name]. Date of Service: 07/05/2020 10:45 AM Medical Record Number: [id number] Patient Account Number: [id number] Date of Birth/Sex: 10-08-1951 (69 y.o. F) Treating RN: Cornell Barman Primary Care Leslye Puccini: Otilio Miu Other Clinician: Referring Naelle Diegel: Otilio Miu Treating Nickalaus Crooke/Extender: Melburn Hake, HOYT Weeks in Treatment: 2 Active Problems Location of Pain Severity and  Description of Pain Patient Has Paino No Site Locations Pain Management and Medication Current Pain Management: Electronic Signature(s) Signed: 07/05/2020 5:13:06 PM By: Levan Hurst RN, BSN Signed: 07/05/2020 7:33:04 PM By: Gretta Cool, BSN, RN, CWS, Kim RN, BSN Entered By: Levan Hurst on 07/05/2020 10:36:21 [name] ([id number]) -------------------------------------------------------------------------------- Patient/Caregiver Education Details Patient Name: [name], [name]. Date of Service: 07/05/2020 10:45 AM Medical Record Number: [id number] Patient Account Number: [id number] Date of Birth/Gender: [birth year] (69 y.o. F) Treating RN: Cornell Barman Primary Care Physician: Otilio Miu Other Clinician: Referring Physician: Otilio Miu Treating Physician/Extender: Melburn Hake, HOYT Weeks in Treatment: 2 Education Assessment Education Provided To: Patient Education Topics Provided Wound/Skin Impairment: Handouts: Caring for Your Ulcer Methods: Demonstration, Explain/Verbal Responses: State content correctly Electronic Signature(s) Signed: 07/05/2020 7:33:04 PM By: Gretta Cool, BSN, RN, CWS, Kim RN, BSN Entered By: Gretta Cool, BSN, RN, CWS, Kim on 07/05/2020 11:03:11 [name] ([id number]) -------------------------------------------------------------------------------- Wound Assessment Details Patient Name: [name], [name]. Date of Service: 07/05/2020 10:45 AM Medical Record Number: [id number] Patient Account Number: [id number] Date of Birth/Sex:  01/16/1951 (69 y.o. F) Treating RN: Cornell Barman Primary Care Elva Breaker: Otilio Miu Other Clinician: Referring Myya Meenach: Otilio Miu Treating Michelena Culmer/Extender: STONE III, HOYT Weeks in Treatment: 2 Wound Status Wound Number: 1 Primary Etiology: Trauma, Other Wound Location: Right, Medial Lower Leg Wound Status: Open Wounding Event: Trauma Comorbid History: Osteoarthritis Date Acquired: 04/02/2020 Weeks Of Treatment:  2 Clustered Wound: No Photos Wound Measurements Length: (cm) 1.4 Width: (cm) 1.2 Depth: (cm) 0.4 Area: (cm) 1.319 Volume: (cm) 0.528 % Reduction in Area: -52.7% % Reduction in Volume: -514% Epithelialization: None Tunneling: No Undermining: No Wound Description Classification: Full Thickness Without Exposed Support Structu Wound Margin: Flat and Intact Exudate Amount: Medium Exudate Type: Serosanguineous Exudate Color: red, brown res Foul Odor After Cleansing: No Slough/Fibrino Yes Wound Bed Granulation Amount: Medium (34-66%) Exposed Structure Granulation Quality: Pink Fat Layer (Subcutaneous Tissue) Exposed: Yes Necrotic Amount: Medium (34-66%) Necrotic Quality: Adherent Slough Treatment Notes Wound #1 (Right, Medial Lower Leg) Notes Santyl, saline moistened gauze, conform, Tubi-grip F Electronic Signature(s) Signed: 07/05/2020 5:13:06 PM By: Levan Hurst RN, BSN Signed: 07/05/2020 7:33:04 PM By: Gretta Cool, BSN, RN, CWS, Kim RN, BSN Entered By: Levan Hurst on 07/05/2020 10:44:24 [name], [name] ([id number], [name] ([id number]) -------------------------------------------------------------------------------- Vitals Details Patient Name: [name], [name]. Date of Service: 07/05/2020 10:45 AM Medical Record Number: [id number] Patient Account Number: [id number] Date of Birth/Sex: 10-25-1951 (69 y.o. F) Treating RN: Cornell Barman Primary Care Franklyn Cafaro: Otilio Miu Other Clinician: Referring Raylee Adamec: Otilio Miu Treating Dezyrae Kensinger/Extender: STONE III, HOYT Weeks in Treatment: 2 Vital Signs Time Taken: 10:34 Temperature (F): 98.4 Height (in): 68 Pulse (bpm): 72 Weight (lbs): 184 Respiratory Rate (breaths/min): 18 Body Mass Index (BMI): 28 Blood Pressure (mmHg): 142/79 Reference Range: 80 - 120 mg / dl Electronic Signature(s) Signed: 07/05/2020 5:13:06 PM By: Levan Hurst RN, BSN Entered By: Levan Hurst on 07/05/2020 10:35:37

## 2020-07-09 LAB — AEROBIC CULTURE W GRAM STAIN (SUPERFICIAL SPECIMEN): Gram Stain: NONE SEEN

## 2020-07-12 ENCOUNTER — Ambulatory Visit: Payer: Medicare Other | Admitting: Physician Assistant

## 2020-07-14 ENCOUNTER — Encounter: Payer: Medicare Other | Admitting: Physician Assistant

## 2020-07-14 ENCOUNTER — Other Ambulatory Visit: Payer: Self-pay

## 2020-07-14 DIAGNOSIS — I872 Venous insufficiency (chronic) (peripheral): Secondary | ICD-10-CM | POA: Diagnosis not present

## 2020-07-14 DIAGNOSIS — L97812 Non-pressure chronic ulcer of other part of right lower leg with fat layer exposed: Secondary | ICD-10-CM | POA: Diagnosis not present

## 2020-07-14 NOTE — Progress Notes (Addendum)
KEOSHIA, STEINMETZ ([id number]) Visit Report for 07/14/2020 Chief Complaint Document Details Patient Name: [name], [name]. Date of Service: 07/14/2020 4:00 PM Medical Record Number: [id number] Patient Account Number: [id number] Date of Birth/Sex: October 20, 1951 (68 y.o. F) Treating RN: Army Melia Primary Care Provider: Otilio Miu Other Clinician: Referring Provider: Otilio Miu Treating Provider/Extender: Melburn Hake, Michelle Wnek Weeks in Treatment: 3 Information Obtained from: Patient Chief Complaint Right LE Ulcer Electronic Signature(s) Signed: 07/14/2020 4:14:54 PM By: Worthy Keeler PA-C Entered By: Worthy Keeler on 07/14/2020 16:14:53 Mentink, Loran Senters ([id number]) -------------------------------------------------------------------------------- HPI Details Patient Name: [name]. Date of Service: 07/14/2020 4:00 PM Medical Record Number: [id number] Patient Account Number: [id number] Date of Birth/Sex: [birth year] (68 y.o. F) Treating RN: Army Melia Primary Care Provider: Otilio Miu Other Clinician: Referring Provider: Otilio Miu Treating Provider/Extender: Melburn Hake, Krisinda Giovanni Weeks in Treatment: 3 History of Present Illness HPI Description: 06/20/2020 upon evaluation today patient presents for initial evaluation here at clinic concerning wound on her right lower leg secondary to having struck this on a metal chair out in her yard in May 2021. She tells me that this has been managed at this point by her primary care provider Dr. Ronnald Ramp. With that being said Dr. Ronnald Ramp did make a referral to [place] to further evaluate this situation as well. The patient has been on doxycycline though she has not at this point and has been using the mupirocin as prescribed by Dr. Ronnald Ramp. She does have a history of venous insufficiency and has had some previous vascular work-up including an ablation secondary to a blood clot that she had in her leg. Subsequently she also has a slightly  elevated ABI indicating likely some hardening of the arteries in general but nonetheless she seems to have good blood flow into the foot there is no evidence of cyanosis or poor capillary refill. The patient does have pain at the site of the injury. He tells me this did not bruise but nonetheless it seems to be somewhat deep. 07/05/2020 upon evaluation today patient unfortunately does not appear to be doing as well as she was previous. There is no signs of active infection at this time which is good news I really felt like after the debridement we performed last time she would be doing much better but she really is not. She still has a lot of necrotic tissue is very tender and actually has some erythema around the wound. Overall I am concerned that things do not seem to be improving quite as effectively and as much as I would like to see. 07/14/2020 on evaluation today patient appears to still be doing somewhat poorly in regard to her leg ulcer. This does still appear to be infected. I did call in a prescription for clindamycin but the patient apparently missed a message in that regard. This is at the pharmacy for her although originally written for 2 times a day dosing I really want her to take it 3 times a day I told her that as well during conversation in the clinic. This is based on the severity of the wound and what I am seeing today. Electronic Signature(s) Signed: 07/14/2020 5:04:29 PM By: Worthy Keeler PA-C Entered By: Worthy Keeler on 07/14/2020 17:04:29 [name] ([id number]) -------------------------------------------------------------------------------- Physical Exam Details Patient Name: [name], [name]. Date of Service: 07/14/2020 4:00 PM Medical Record Number: [id number] Patient Account Number: [id number] Date of Birth/Sex: 05/03/1951 (68 y.o. F) Treating RN: Army Melia Primary Care Provider: Otilio Miu Other  Clinician: Referring Provider: Otilio Miu Treating Provider/Extender: STONE III, Loida Calamia Weeks in Treatment: 3 Constitutional Well-nourished and well-hydrated in no acute distress. Respiratory normal breathing without difficulty. Psychiatric this patient is able to make decisions and demonstrates good insight into disease process. Alert and Oriented x 3. pleasant and cooperative. Notes His wound bed showed signs of erythema surrounding the edges of the wound and the base of the wound still shows signs of slough buildup at this point. I really do not want to perform any debridement currently due to the fact that she is having such significant discomfort. I really believe that she would benefit from the clindamycin in the center she get that started the better. Electronic Signature(s) Signed: 07/14/2020 5:04:48 PM By: Worthy Keeler PA-C Entered By: Worthy Keeler on 07/14/2020 17:04:48 [name] ([id number]) -------------------------------------------------------------------------------- Physician Orders Details Patient Name: [name], [name]. Date of Service: 07/14/2020 4:00 PM Medical Record Number: [id number] Patient Account Number: [id number] Date of Birth/Sex: 20-Aug-1951 (68 y.o. F) Treating RN: Grover Canavan Primary Care Provider: Otilio Miu Other Clinician: Referring Provider: Otilio Miu Treating Provider/Extender: Melburn Hake, Jonel Weldon Weeks in Treatment: 3 Verbal / Phone Orders: No Diagnosis Coding ICD-10 Coding Code Description S81.801A Unspecified open wound, right lower leg, initial encounter L97.812 Non-pressure chronic ulcer of other part of right lower leg with fat layer exposed I87.2 Venous insufficiency (chronic) (peripheral) I73.89 Other specified peripheral vascular diseases Wound Cleansing Wound #1 Right,Medial Lower Leg o Clean wound with Normal Saline. o Dial antibacterial soap, wash wounds, rinse and pat dry prior to dressing wounds Anesthetic (add to Medication  List) Wound #1 Right,Medial Lower Leg o Topical Lidocaine 4% cream applied to wound bed prior to debridement (In Clinic Only). Primary Wound Dressing Wound #1 Right,Medial Lower Leg o Santyl Ointment - saline moistened gauze Secondary Dressing Wound #1 Right,Medial Lower Leg o ABD and Kerlix/Conform Dressing Change Frequency Wound #1 Right,Medial Lower Leg o Change dressing every day. Follow-up Appointments Wound #1 Right,Medial Lower Leg o Return Appointment in 1 week. Edema Control Wound #1 Right,Medial Lower Leg o Other: - Tubi-grip F wear daily for swelling Additional Orders / Instructions Wound #1 Right,Medial Lower Leg o Increase protein intake. Electronic Signature(s) Signed: 07/14/2020 4:35:55 PM By: Grover Canavan Signed: 07/15/2020 5:00:44 PM By: Worthy Keeler PA-C Entered By: Grover Canavan on 07/14/2020 16:22:11 [name] ([id number]) -------------------------------------------------------------------------------- Problem List Details Patient Name: [name], [name]. Date of Service: 07/14/2020 4:00 PM Medical Record Number: [id number] Patient Account Number: [id number] Date of Birth/Sex: 1951/04/04 (68 y.o. F) Treating RN: Army Melia Primary Care Provider: Otilio Miu Other Clinician: Referring Provider: Otilio Miu Treating Provider/Extender: Melburn Hake, Sheron Tallman Weeks in Treatment: 3 Active Problems ICD-10 Encounter Code Description Active Date MDM Diagnosis S81.801A Unspecified open wound, right lower leg, initial encounter 06/20/2020 No Yes L97.812 Non-pressure chronic ulcer of other part of right lower leg with fat layer 06/20/2020 No Yes exposed I87.2 Venous insufficiency (chronic) (peripheral) 06/20/2020 No Yes I73.89 Other specified peripheral vascular diseases 06/20/2020 No Yes Inactive Problems Resolved Problems Electronic Signature(s) Signed: 07/14/2020 4:12:39 PM By: Worthy Keeler PA-C Entered By: Worthy Keeler on  07/14/2020 16:12:39 [name] ([id number]) -------------------------------------------------------------------------------- Progress Note Details Patient Name: [name]. Date of Service: 07/14/2020 4:00 PM Medical Record Number: [id number] Patient Account Number: [id number] Date of Birth/Sex: 25-Jan-1951 (68 y.o. F) Treating RN: Army Melia Primary Care Provider: Otilio Miu Other Clinician: Referring Provider: Otilio Miu Treating Provider/Extender: STONE III, Trejan Buda Weeks in Treatment: 3 Subjective  Chief Complaint Information obtained from Patient Right LE Ulcer History of Present Illness (HPI) 06/20/2020 upon evaluation today patient presents for initial evaluation here at clinic concerning wound on her right lower leg secondary to having struck this on a metal chair out in her yard in May 2021. She tells me that this has been managed at this point by her primary care provider Dr. Ronnald Ramp. With that being said Dr. Ronnald Ramp did make a referral to [place] to further evaluate this situation as well. The patient has been on doxycycline though she has not at this point and has been using the mupirocin as prescribed by Dr. Ronnald Ramp. She does have a history of venous insufficiency and has had some previous vascular work-up including an ablation secondary to a blood clot that she had in her leg. Subsequently she also has a slightly elevated ABI indicating likely some hardening of the arteries in general but nonetheless she seems to have good blood flow into the foot there is no evidence of cyanosis or poor capillary refill. The patient does have pain at the site of the injury. He tells me this did not bruise but nonetheless it seems to be somewhat deep. 07/05/2020 upon evaluation today patient unfortunately does not appear to be doing as well as she was previous. There is no signs of active infection at this time which is good news I really felt like after the debridement we performed last  time she would be doing much better but she really is not. She still has a lot of necrotic tissue is very tender and actually has some erythema around the wound. Overall I am concerned that things do not seem to be improving quite as effectively and as much as I would like to see. 07/14/2020 on evaluation today patient appears to still be doing somewhat poorly in regard to her leg ulcer. This does still appear to be infected. I did call in a prescription for clindamycin but the patient apparently missed a message in that regard. This is at the pharmacy for her although originally written for 2 times a day dosing I really want her to take it 3 times a day I told her that as well during conversation in the clinic. This is based on the severity of the wound and what I am seeing today. Objective Constitutional Well-nourished and well-hydrated in no acute distress. Vitals Time Taken: 4:07 PM, Height: 68 in, Weight: 184 lbs, BMI: 28, Temperature: 98.2 F, Pulse: 81 bpm, Respiratory Rate: 16 breaths/min, Blood Pressure: 138/94 mmHg. Respiratory normal breathing without difficulty. Psychiatric this patient is able to make decisions and demonstrates good insight into disease process. Alert and Oriented x 3. pleasant and cooperative. General Notes: His wound bed showed signs of erythema surrounding the edges of the wound and the base of the wound still shows signs of slough buildup at this point. I really do not want to perform any debridement currently due to the fact that she is having such significant discomfort. I really believe that she would benefit from the clindamycin in the center she get that started the better. Integumentary (Hair, Skin) Wound #1 status is Open. Original cause of wound was Trauma. The wound is located on the Right,Medial Lower Leg. The wound measures 1.8cm length x 1.9cm width x 0.3cm depth; 2.686cm^2 area and 0.806cm^3 volume. There is Fat Layer (Subcutaneous Tissue) Exposed  exposed. There is no tunneling or undermining noted. There is a medium amount of serosanguineous drainage noted. The wound margin is  flat and intact. There is medium (34-66%) pink granulation within the wound bed. There is a medium (34-66%) amount of necrotic tissue within the wound bed including Adherent Slough. [name], [name] ([id number]) Assessment Active Problems ICD-10 Unspecified open wound, right lower leg, initial encounter Non-pressure chronic ulcer of other part of right lower leg with fat layer exposed Venous insufficiency (chronic) (peripheral) Other specified peripheral vascular diseases Plan Wound Cleansing: Wound #1 Right,Medial Lower Leg: Clean wound with Normal Saline. Dial antibacterial soap, wash wounds, rinse and pat dry prior to dressing wounds Anesthetic (add to Medication List): Wound #1 Right,Medial Lower Leg: Topical Lidocaine 4% cream applied to wound bed prior to debridement (In Clinic Only). Primary Wound Dressing: Wound #1 Right,Medial Lower Leg: Santyl Ointment - saline moistened gauze Secondary Dressing: Wound #1 Right,Medial Lower Leg: ABD and Kerlix/Conform Dressing Change Frequency: Wound #1 Right,Medial Lower Leg: Change dressing every day. Follow-up Appointments: Wound #1 Right,Medial Lower Leg: Return Appointment in 1 week. Edema Control: Wound #1 Right,Medial Lower Leg: Other: - Tubi-grip F wear daily for swelling Additional Orders / Instructions: Wound #1 Right,Medial Lower Leg: Increase protein intake. 1. I did give her a prescription for clindamycin 300 mg twice a day actually I did change that to have her take it 3 times a day during the office visit today. That means that she is can end up running out sooner but that is okay I will give her a new prescription next week written for 3 times a day which will extend it for the full length of treatment that I wanted her to have. 2. With regard to the wound itself we will continue  with the Santyl at this point. 3. With regard to compression we can continue with the Tubigrip so that she can change this dressing daily in regard to the Santyl. We will see patient back for reevaluation in 1 week here in the clinic. If anything worsens or changes patient will contact our office for additional recommendations. Electronic Signature(s) Signed: 07/14/2020 5:05:29 PM By: Worthy Keeler PA-C Entered By: Worthy Keeler on 07/14/2020 17:05:28 [name] ([id number]) -------------------------------------------------------------------------------- SuperBill Details Patient Name: [name]. Date of Service: 07/14/2020 Medical Record Number: [id number] Patient Account Number: [id number] Date of Birth/Sex: Aug 02, 1951 (68 y.o. F) Treating RN: Army Melia Primary Care Provider: Otilio Miu Other Clinician: Referring Provider: Otilio Miu Treating Provider/Extender: Melburn Hake, Odie Edmonds Weeks in Treatment: 3 Diagnosis Coding ICD-10 Codes Code Description [phone] Unspecified open wound, right lower leg, initial encounter L97.812 Non-pressure chronic ulcer of other part of right lower leg with fat layer exposed I87.2 Venous insufficiency (chronic) (peripheral) I73.89 Other specified peripheral vascular diseases Facility Procedures CPT4 Code: [id number] Description: 99213 - WOUND CARE VISIT-LEV 3 EST PT Modifier: Quantity: 1 Physician Procedures CPT4 Code: [id number] Description: 35597 - WC PHYS LEVEL 3 - EST PT Modifier: Quantity: 1 CPT4 Code: Description: ICD-10 Diagnosis Description S81.801A Unspecified open wound, right lower leg, initial encounter L97.812 Non-pressure chronic ulcer of other part of right lower leg with fat la I87.2 Venous insufficiency (chronic) (peripheral) I73.89 Other  specified peripheral vascular diseases Modifier: yer exposed Quantity: Electronic Signature(s) Signed: 07/14/2020 5:05:53 PM By: Worthy Keeler PA-C Entered By: Worthy Keeler on 07/14/2020 17:05:53

## 2020-07-14 NOTE — Progress Notes (Signed)
ARIEON, SCALZO ([id number]) Visit Report for 07/14/2020 Arrival Information Details Patient Name: [name], [name]. Date of Service: 07/14/2020 4:00 PM Medical Record Number: [id number] Patient Account Number: [id number] Date of Birth/Sex: Sep 21, 1951 (68 y.o. F) Treating RN: Grover Canavan Primary Care Jannelle Notaro: Otilio Miu Other Clinician: Referring Arianah Torgeson: Otilio Miu Treating Cornel Werber/Extender: Melburn Hake, HOYT Weeks in Treatment: 3 Visit Information History Since Last Visit Added or deleted any medications: No Patient Arrived: Ambulatory Had a fall or experienced change in No Arrival Time: 16:06 activities of daily living that may affect Accompanied By: self risk of falls: Transfer Assistance: None Hospitalized since last visit: No Patient Requires Transmission-Based No Pain Present Now: Yes Precautions: Patient Has Alerts: Yes Patient Alerts: Patient on Blood Thinner Warfarin Electronic Signature(s) Signed: 07/14/2020 4:35:55 PM By: Grover Canavan Entered By: Grover Canavan on 07/14/2020 16:11:56 [name] ([id number]) -------------------------------------------------------------------------------- Clinic Level of Care Assessment Details Patient Name: [name]. Date of Service: 07/14/2020 4:00 PM Medical Record Number: [id number] Patient Account Number: [id number] Date of Birth/Sex: 05-25-1951 (68 y.o. F) Treating RN: Grover Canavan Primary Care Makeda Peeks: Otilio Miu Other Clinician: Referring Jonthan Leite: Otilio Miu Treating Virdia Ziesmer/Extender: Melburn Hake, HOYT Weeks in Treatment: 3 Clinic Level of Care Assessment Items TOOL 4 Quantity Score []  - Use when only an EandM is performed on FOLLOW-UP visit 0 ASSESSMENTS - Nursing Assessment / Reassessment X - Reassessment of Co-morbidities (includes updates in patient status) 1 10 X- 1 5 Reassessment of Adherence to Treatment Plan ASSESSMENTS - Wound and Skin Assessment /  Reassessment X - Simple Wound Assessment / Reassessment - one wound 1 5 []  - 0 Complex Wound Assessment / Reassessment - multiple wounds []  - 0 Dermatologic / Skin Assessment (not related to wound area) ASSESSMENTS - Focused Assessment []  - Circumferential Edema Measurements - multi extremities 0 []  - 0 Nutritional Assessment / Counseling / Intervention X- 1 5 Lower Extremity Assessment (monofilament, tuning fork, pulses) []  - 0 Peripheral Arterial Disease Assessment (using hand held doppler) ASSESSMENTS - Ostomy and/or Continence Assessment and Care []  - Incontinence Assessment and Management 0 []  - 0 Ostomy Care Assessment and Management (repouching, etc.) PROCESS - Coordination of Care X - Simple Patient / Family Education for ongoing care 1 15 []  - 0 Complex (extensive) Patient / Family Education for ongoing care []  - 0 Staff obtains Programmer, systems, Records, Test Results / Process Orders []  - 0 Staff telephones HHA, Nursing Homes / Clarify orders / etc []  - 0 Routine Transfer to another Facility (non-emergent condition) []  - 0 Routine Hospital Admission (non-emergent condition) []  - 0 New Admissions / Biomedical engineer / Ordering NPWT, Apligraf, etc. []  - 0 Emergency Hospital Admission (emergent condition) X- 1 10 Simple Discharge Coordination []  - 0 Complex (extensive) Discharge Coordination PROCESS - Special Needs []  - Pediatric / Minor Patient Management 0 []  - 0 Isolation Patient Management []  - 0 Hearing / Language / Visual special needs []  - 0 Assessment of Community assistance (transportation, D/C planning, etc.) []  - 0 Additional assistance / Altered mentation []  - 0 Support Surface(s) Assessment (bed, cushion, seat, etc.) INTERVENTIONS - Wound Cleansing / Measurement [name], [name] ([id number]) X- 1 5 Simple Wound Cleansing - one wound []  - 0 Complex Wound Cleansing - multiple wounds X- 1 5 Wound Imaging (photographs - any number of  wounds) []  - 0 Wound Tracing (instead of photographs) X- 1 5 Simple Wound Measurement - one wound []  - 0 Complex Wound Measurement - multiple wounds INTERVENTIONS - Wound Dressings []  -  Small Wound Dressing one or multiple wounds 0 X- 1 15 Medium Wound Dressing one or multiple wounds []  - 0 Large Wound Dressing one or multiple wounds []  - 0 Application of Medications - topical []  - 0 Application of Medications - injection INTERVENTIONS - Miscellaneous []  - External ear exam 0 []  - 0 Specimen Collection (cultures, biopsies, blood, body fluids, etc.) []  - 0 Specimen(s) / Culture(s) sent or taken to Lab for analysis []  - 0 Patient Transfer (multiple staff / Harrel Lemon Lift / Similar devices) []  - 0 Simple Staple / Suture removal (25 or less) []  - 0 Complex Staple / Suture removal (26 or more) []  - 0 Hypo / Hyperglycemic Management (close monitor of Blood Glucose) []  - 0 Ankle / Brachial Index (ABI) - do not check if billed separately X- 1 5 Vital Signs Has the patient been seen at the hospital within the last three years: Yes Total Score: 85 Level Of Care: New/Established - Level 3 Electronic Signature(s) Signed: 07/14/2020 4:35:55 PM By: Grover Canavan Entered By: Grover Canavan on 07/14/2020 16:22:55 [name] ([id number]) -------------------------------------------------------------------------------- Encounter Discharge Information Details Patient Name: [name]. Date of Service: 07/14/2020 4:00 PM Medical Record Number: [id number] Patient Account Number: [id number] Date of Birth/Sex: 1951-11-30 (68 y.o. F) Treating RN: Grover Canavan Primary Care Author [place]: Otilio Miu Other Clinician: Referring Dhara Schepp: Otilio Miu Treating Quintara Bost/Extender: Melburn Hake, HOYT Weeks in Treatment: 3 Encounter Discharge Information Items Discharge Condition: Stable Ambulatory Status: Ambulatory Discharge Destination: Home Transportation: Private  Auto Accompanied By: self Schedule Follow-up Appointment: Yes Clinical Summary of Care: Electronic Signature(s) Signed: 07/14/2020 4:35:55 PM By: Grover Canavan Entered By: Grover Canavan on 07/14/2020 16:23:31 [name] ([id number]) -------------------------------------------------------------------------------- Lower Extremity Assessment Details Patient Name: [name]. Date of Service: 07/14/2020 4:00 PM Medical Record Number: [id number] Patient Account Number: [id number] Date of Birth/Sex: 1951-01-01 (68 y.o. F) Treating RN: Grover Canavan Primary Care Aislin Onofre: Otilio Miu Other Clinician: Referring Day Deery: Otilio Miu Treating Zaleigh Bermingham/Extender: STONE III, HOYT Weeks in Treatment: 3 Edema Assessment Assessed: [Left: No] [Right: Yes] Edema: [Left: N] [Right: o] Calf Left: Right: Point of Measurement: 30 cm From Medial Instep cm 36 cm Ankle Left: Right: Point of Measurement: 12 cm From Medial Instep cm 20 cm Vascular Assessment Pulses: Dorsalis Pedis Palpable: [Right:Yes] Posterior Tibial Palpable: [Right:Yes] Electronic Signature(s) Signed: 07/14/2020 4:35:55 PM By: Grover Canavan Entered By: Grover Canavan on 07/14/2020 16:14:09 [name] ([id number]) -------------------------------------------------------------------------------- Multi Wound Chart Details Patient Name: [name]. Date of Service: 07/14/2020 4:00 PM Medical Record Number: [id number] Patient Account Number: [id number] Date of Birth/Sex: 07-May-1951 (68 y.o. F) Treating RN: Grover Canavan Primary Care Emmily Pellegrin: Otilio Miu Other Clinician: Referring Dimitria Ketchum: Otilio Miu Treating Sadik Piascik/Extender: STONE III, HOYT Weeks in Treatment: 3 Vital Signs Height(in): 68 Pulse(bpm): 4 Weight(lbs): 184 Blood Pressure(mmHg): 138/94 Body Mass Index(BMI): 28 Temperature(F): 98.2 Respiratory Rate(breaths/min): 16 Photos: [N/A:N/A] Wound  Location: Right, Medial Lower Leg N/A N/A Wounding Event: Trauma N/A N/A Primary Etiology: Trauma, Other N/A N/A Comorbid History: Osteoarthritis N/A N/A Date Acquired: 04/02/2020 N/A N/A Weeks of Treatment: 3 N/A N/A Wound Status: Open N/A N/A Measurements L x W x D (cm) 1.8x1.9x0.3 N/A N/A Area (cm) : 2.686 N/A N/A Volume (cm) : 0.806 N/A N/A % Reduction in Area: -210.90% N/A N/A % Reduction in Volume: -837.20% N/A N/A Classification: Full Thickness Without Exposed N/A N/A Support Structures Exudate Amount: Medium N/A N/A Exudate Type: Serosanguineous N/A N/A Exudate Color: red, brown N/A N/A Wound Margin: Flat and  Intact N/A N/A Granulation Amount: Medium (34-66%) N/A N/A Granulation Quality: Pink N/A N/A Necrotic Amount: Medium (34-66%) N/A N/A Exposed Structures: Fat Layer (Subcutaneous Tissue) N/A N/A Exposed: Yes Epithelialization: None N/A N/A Treatment Notes Electronic Signature(s) Signed: 07/14/2020 4:35:55 PM By: Grover Canavan Entered By: Grover Canavan on 07/14/2020 16:21:17 [name] ([id number]) -------------------------------------------------------------------------------- [place] Details Patient Name: [name]. Date of Service: 07/14/2020 4:00 PM Medical Record Number: [id number] Patient Account Number: [id number] Date of Birth/Sex: 1951/02/05 (68 y.o. F) Treating RN: Grover Canavan Primary Care Clary Boulais: Otilio Miu Other Clinician: Referring Morley Gaumer: Otilio Miu Treating Cyrstal Leitz/Extender: Melburn Hake, HOYT Weeks in Treatment: 3 Active Inactive Necrotic Tissue Nursing Diagnoses: Impaired tissue integrity related to necrotic/devitalized tissue Goals: Necrotic/devitalized tissue will be minimized in the wound bed Date Initiated: 06/20/2020 Target Resolution Date: 06/27/2020 Goal Status: Active Interventions: Assess patient pain level pre-, during and post procedure and prior to discharge Treatment  Activities: Apply topical anesthetic as ordered : 06/20/2020 Excisional debridement : 06/20/2020 Notes: Orientation to the Wound Care Program Nursing Diagnoses: Knowledge deficit related to the wound healing center program Goals: Patient/caregiver will verbalize understanding of the [place] Date Initiated: 06/20/2020 Target Resolution Date: 06/27/2020 Goal Status: Active Interventions: Provide education on orientation to the wound center Notes: Wound/Skin Impairment Nursing Diagnoses: Impaired tissue integrity Goals: Ulcer/skin breakdown will have a volume reduction of 30% by week 4 Date Initiated: 06/20/2020 Target Resolution Date: 07/21/2020 Goal Status: Active Interventions: Assess patient/caregiver ability to obtain necessary supplies Assess ulceration(s) every visit Treatment Activities: Skin care regimen initiated : 06/20/2020 Notes: Electronic Signature(s) [name], [name] ([id number]) Signed: 07/14/2020 4:35:55 PM By: Grover Canavan Entered By: Grover Canavan on 07/14/2020 16:21:10 [name] ([id number]) -------------------------------------------------------------------------------- Pain Assessment Details Patient Name: [name]. Date of Service: 07/14/2020 4:00 PM Medical Record Number: [id number] Patient Account Number: [id number] Date of Birth/Sex: 13-Feb-1951 (68 y.o. F) Treating RN: Grover Canavan Primary Care Gaylon Bentz: Otilio Miu Other Clinician: Referring Karlita Lichtman: Otilio Miu Treating Dontel Harshberger/Extender: Melburn Hake, HOYT Weeks in Treatment: 3 Active Problems Location of Pain Severity and Description of Pain Patient Has Paino No Site Locations Duration of the Pain. Constant / Intermittento Intermittent Rate the pain. Current Pain Level: 3 Pain Management and Medication Current Pain Management: Electronic Signature(s) Signed: 07/14/2020 4:35:55 PM By: Grover Canavan Entered By: Grover Canavan on  07/14/2020 16:12:05 [name] ([id number]) -------------------------------------------------------------------------------- Patient/Caregiver Education Details Patient Name: [name]. Date of Service: 07/14/2020 4:00 PM Medical Record Number: [id number] Patient Account Number: [id number] Date of Birth/Gender: May 24, 1951 (68 y.o. F) Treating RN: Grover Canavan Primary Care Physician: Otilio Miu Other Clinician: Referring Physician: Otilio Miu Treating Physician/Extender: Melburn Hake, HOYT Weeks in Treatment: 3 Education Assessment Education Provided To: Patient Education Topics Provided Wound/Skin Impairment: Handouts: Caring for Your Ulcer Methods: Demonstration, Explain/Verbal Responses: State content correctly Electronic Signature(s) Signed: 07/14/2020 4:35:55 PM By: Grover Canavan Entered By: Grover Canavan on 07/14/2020 16:23:06 [name] ([id number]) -------------------------------------------------------------------------------- Wound Assessment Details Patient Name: [name]. Date of Service: 07/14/2020 4:00 PM Medical Record Number: [id number] Patient Account Number: [id number] Date of Birth/Sex: 07/06/1951 (68 y.o. F) Treating RN: Grover Canavan Primary Care Yeslin Delio: Otilio Miu Other Clinician: Referring Ayauna Mcnay: Otilio Miu Treating Omara Alcon/Extender: STONE III, HOYT Weeks in Treatment: 3 Wound Status Wound Number: 1 Primary Etiology: Trauma, Other Wound Location: Right, Medial Lower Leg Wound Status: Open Wounding Event: Trauma Comorbid History: Osteoarthritis Date Acquired: 04/02/2020 Weeks Of Treatment: 3 Clustered Wound: No Photos Wound Measurements Length: (cm) 1.8  Width: (cm) 1.9 Depth: (cm) 0.3 Area: (cm) 2.686 Volume: (cm) 0.806 % Reduction in Area: -210.9% % Reduction in Volume: -837.2% Epithelialization: None Tunneling: No Undermining: No Wound Description Classification: Full  Thickness Without Exposed Support Structu Wound Margin: Flat and Intact Exudate Amount: Medium Exudate Type: Serosanguineous Exudate Color: red, brown res Foul Odor After Cleansing: No Slough/Fibrino Yes Wound Bed Granulation Amount: Medium (34-66%) Exposed Structure Granulation Quality: Pink Fat Layer (Subcutaneous Tissue) Exposed: Yes Necrotic Amount: Medium (34-66%) Necrotic Quality: Adherent Slough Treatment Notes Wound #1 (Right, Medial Lower Leg) Notes Santyl, saline moistened gauze, conform, Tubi-grip F Electronic Signature(s) Signed: 07/14/2020 4:35:55 PM By: Grover Canavan Entered By: Grover Canavan on 07/14/2020 16:13:19 [name] ([id number]) [name], [name] ([id number]) -------------------------------------------------------------------------------- Vitals Details Patient Name: [name]. Date of Service: 07/14/2020 4:00 PM Medical Record Number: [id number] Patient Account Number: [id number] Date of Birth/Sex: [birth year] (68 y.o. F) Treating RN: Grover Canavan Primary Care Shakelia Scrivner: Otilio Miu Other Clinician: Referring Breonna Gafford: Otilio Miu Treating Brittish Bolinger/Extender: STONE III, HOYT Weeks in Treatment: 3 Vital Signs Time Taken: 16:07 Temperature (F): 98.2 Height (in): 68 Pulse (bpm): 81 Weight (lbs): 184 Respiratory Rate (breaths/min): 16 Body Mass Index (BMI): 28 Blood Pressure (mmHg): 138/94 Reference Range: 80 - 120 mg / dl Electronic Signature(s) Signed: 07/14/2020 4:35:55 PM By: Grover Canavan Entered By: Grover Canavan on 07/14/2020 16:12:00

## 2020-07-17 ENCOUNTER — Other Ambulatory Visit: Payer: Self-pay | Admitting: Family Medicine

## 2020-07-17 DIAGNOSIS — Z7901 Long term (current) use of anticoagulants: Secondary | ICD-10-CM

## 2020-07-17 NOTE — Telephone Encounter (Signed)
Requested medication (s) are due for refill today: yes  Requested medication (s) are on the active medication list: yes  Last refill:  04/26/20  Future visit scheduled: no  Notes to clinic:  med not delegated to NT to RF/overdue INR   Requested Prescriptions  Pending Prescriptions Disp Refills   warfarin (COUMADIN) 1 MG tablet [Pharmacy Med Name: WARFARIN SODIUM 1 MG TABLET] 90 tablet 0    Sig: TAKE 1 TABLET BY [place] DAY      Hematology:  Anticoagulants - warfarin Failed - 07/17/2020  8:30 AM      Failed - This refill cannot be delegated      Failed - If the patient is managed by [facility] Clinic - route to their Pool. If not, forward to the provider.      Failed - INR in normal range and within 30 days    INR  Date Value Ref Range Status  05/23/2020 1.9 (H) 0.9 - 1.2 Final    Comment:    Reference interval is for non-anticoagulated patients. Suggested INR therapeutic range for Vitamin K antagonist therapy:    Standard Dose (moderate intensity                   therapeutic range):       2.0 - 3.0    Higher intensity therapeutic range       2.5 - 3.5   04/23/2012 1.5  Final    Comment:    INR reference interval applies to patients on anticoagulant therapy. A single INR therapeutic range for coumarins is not optimal for all indications; however, the suggested range for most indications is 2.0 - 3.0. Exceptions to the INR Reference Range may include: Prosthetic heart valves, acute myocardial infarction, prevention of myocardial infarction, and combinations of aspirin and anticoagulant. The need for a higher or lower target INR must be assessed individually. Reference: The Pharmacology and Management of the Vitamin K  antagonists: the seventh ACCP Conference on Antithrombotic and Thrombolytic Therapy. ZOXWR.6045 Sept:126 (3suppl): N9146842. A HCT value >55% may artifactually increase the PT.  In one study,  the increase was an average of 25%. Reference:  "Effect on  Routine and Special Coagulation Testing Values of Citrate Anticoagulant Adjustment in Patients with High HCT Values." American Journal of Clinical Pathology 2006;126:400-405.           Passed - Valid encounter within last 3 months    Recent Outpatient Visits           1 month ago Mild memory disturbance   [facility] Juline Patch, MD   1 month ago Cellulitis of right lower extremity   [facility] Juline Patch, MD   2 months ago Anticoagulant long-term use   [facility] Juline Patch, MD   2 months ago Cellulitis of right lower extremity   [facility] Juline Patch, MD   5 months ago Taking medication for chronic disease   [facility] Juline Patch, MD

## 2020-07-21 ENCOUNTER — Other Ambulatory Visit: Payer: Self-pay

## 2020-07-21 ENCOUNTER — Other Ambulatory Visit: Payer: Self-pay | Admitting: Family Medicine

## 2020-07-21 ENCOUNTER — Encounter: Payer: Medicare Other | Admitting: Physician Assistant

## 2020-07-21 DIAGNOSIS — S81801A Unspecified open wound, right lower leg, initial encounter: Secondary | ICD-10-CM | POA: Diagnosis not present

## 2020-07-21 DIAGNOSIS — I872 Venous insufficiency (chronic) (peripheral): Secondary | ICD-10-CM | POA: Diagnosis not present

## 2020-07-21 DIAGNOSIS — I7389 Other specified peripheral vascular diseases: Secondary | ICD-10-CM | POA: Diagnosis not present

## 2020-07-21 DIAGNOSIS — L97812 Non-pressure chronic ulcer of other part of right lower leg with fat layer exposed: Secondary | ICD-10-CM | POA: Diagnosis not present

## 2020-07-21 DIAGNOSIS — F3342 Major depressive disorder, recurrent, in full remission: Secondary | ICD-10-CM

## 2020-07-21 NOTE — Progress Notes (Addendum)
LORIN, HAUCK ([id number]) Visit Report for 07/21/2020 Chief Complaint Document Details Patient Name: [name], [name]. Date of Service: 07/21/2020 3:45 PM Medical Record Number: [id number] Patient Account Number: [id number] Date of Birth/Sex: May 26, 1951 (68 y.o. F) Treating RN: Cornell Barman Primary Care Provider: Otilio Miu Other Clinician: Referring Provider: Otilio Miu Treating Provider/Extender: Melburn Hake, Undray Allman Weeks in Treatment: 4 Information Obtained from: Patient Chief Complaint Right LE Ulcer Electronic Signature(s) Signed: 07/21/2020 3:47:30 PM By: Worthy Keeler PA-C Entered By: Worthy Keeler on 07/21/2020 15:47:30 Camba, Loran Senters ([id number]) -------------------------------------------------------------------------------- Debridement Details Patient Name: [name]. Date of Service: 07/21/2020 3:45 PM Medical Record Number: [id number] Patient Account Number: [id number] Date of Birth/Sex: 1951/08/28 (68 y.o. F) Treating RN: Cornell Barman Primary Care Provider: Otilio Miu Other Clinician: Referring Provider: Otilio Miu Treating Provider/Extender: Melburn Hake, Julena Barbour Weeks in Treatment: 4 Debridement Performed for Wound #1 Right,Medial Lower Leg Assessment: Performed By: Physician STONE III, Jennamarie Goings E., PA-C Debridement Type: Chemical/Enzymatic/Mechanical Agent Used: Santyl Level of Consciousness (Pre- Awake and Alert procedure): Pre-procedure Verification/Time Out Yes - 16:00 Taken: Instrument: Other : tongue blade Bleeding: None Response to Treatment: Procedure was tolerated well Level of Consciousness (Post- Awake and Alert procedure): Post Debridement Measurements of Total Wound Length: (cm) 1.5 Width: (cm) 1.5 Depth: (cm) 0.4 Volume: (cm) 0.707 Character of Wound/Ulcer Post Debridement: Stable Post Procedure Diagnosis Same as Pre-procedure Electronic Signature(s) Signed: 07/21/2020 5:20:06 PM By: Gretta Cool, BSN, RN, CWS, Kim RN,  BSN Signed: 07/22/2020 8:26:21 AM By: Worthy Keeler PA-C Entered By: Gretta Cool, BSN, RN, CWS, Kim on 07/21/2020 17:20:06 [name] ([id number]) -------------------------------------------------------------------------------- HPI Details Patient Name: [name]. Date of Service: 07/21/2020 3:45 PM Medical Record Number: [id number] Patient Account Number: [id number] Date of Birth/Sex: 1951/01/04 (68 y.o. F) Treating RN: Cornell Barman Primary Care Provider: Otilio Miu Other Clinician: Referring Provider: Otilio Miu Treating Provider/Extender: Melburn Hake, Kiley Solimine Weeks in Treatment: 4 History of Present Illness HPI Description: 06/20/2020 upon evaluation today patient presents for initial evaluation here at clinic concerning wound on her right lower leg secondary to having struck this on a metal chair out in her yard in May 2021. She tells me that this has been managed at this point by her primary care provider Dr. Ronnald Ramp. With that being said Dr. Ronnald Ramp did make a referral to [place] to further evaluate this situation as well. The patient has been on doxycycline though she has not at this point and has been using the mupirocin as prescribed by Dr. Ronnald Ramp. She does have a history of venous insufficiency and has had some previous vascular work-up including an ablation secondary to a blood clot that she had in her leg. Subsequently she also has a slightly elevated ABI indicating likely some hardening of the arteries in general but nonetheless she seems to have good blood flow into the foot there is no evidence of cyanosis or poor capillary refill. The patient does have pain at the site of the injury. He tells me this did not bruise but nonetheless it seems to be somewhat deep. 07/05/2020 upon evaluation today patient unfortunately does not appear to be doing as well as she was previous. There is no signs of active infection at this time which is good news I really felt like after the  debridement we performed last time she would be doing much better but she really is not. She still has a lot of necrotic tissue is very tender and actually has some erythema around the wound. Overall I  am concerned that things do not seem to be improving quite as effectively and as much as I would like to see. 07/14/2020 on evaluation today patient appears to still be doing somewhat poorly in regard to her leg ulcer. This does still appear to be infected. I did call in a prescription for clindamycin but the patient apparently missed a message in that regard. This is at the pharmacy for her although originally written for 2 times a day dosing I really want her to take it 3 times a day I told her that as well during conversation in the clinic. This is based on the severity of the wound and what I am seeing today. 07/21/20 upon evaluation patient appears to be doing well with regard to her wound as compared to previous. Fortunately there is no signs of active infection I do believe the clindamycin has done better for her since she started taking that. With that being said I do need to extend this for her because I did have her take it 3 times a day and it was written for 2 times a day. She is in agreement with that plan. Electronic Signature(s) Signed: 07/21/2020 4:28:07 PM By: Worthy Keeler PA-C Entered By: Worthy Keeler on 07/21/2020 16:28:06 [name] ([id number]) -------------------------------------------------------------------------------- Physical Exam Details Patient Name: [name]. Date of Service: 07/21/2020 3:45 PM Medical Record Number: [id number] Patient Account Number: [id number] Date of Birth/Sex: 07/11/1951 (68 y.o. F) Treating RN: Cornell Barman Primary Care Provider: Otilio Miu Other Clinician: Referring Provider: Otilio Miu Treating Provider/Extender: STONE III, Yaniris Braddock Weeks in Treatment: 4 Constitutional Well-nourished and well-hydrated in no acute  distress. Respiratory normal breathing without difficulty. Psychiatric this patient is able to make decisions and demonstrates good insight into disease process. Alert and Oriented x 3. pleasant and cooperative. Notes Upon inspection patient's wound bed actually showed signs of good granulation at this time there does not appear to be any evidence of infection she does have some slough noted but the Santyl is helping to clean this away which is good news. Overall I'm very pleased with where she stands currently. Electronic Signature(s) Signed: 07/21/2020 4:28:27 PM By: Worthy Keeler PA-C Entered By: Worthy Keeler on 07/21/2020 16:28:26 [name] ([id number]) -------------------------------------------------------------------------------- Physician Orders Details Patient Name: CIMONE, FAHEY. Date of Service: 07/21/2020 3:45 PM Medical Record Number: [id number] Patient Account Number: [id number] Date of Birth/Sex: [birth year] (68 y.o. F) Treating RN: Cornell Barman Primary Care Provider: Otilio Miu Other Clinician: Referring Provider: Otilio Miu Treating Provider/Extender: Melburn Hake, Sarahlynn Cisnero Weeks in Treatment: 4 Verbal / Phone Orders: No Diagnosis Coding ICD-10 Coding Code Description S81.801A Unspecified open wound, right lower leg, initial encounter L97.812 Non-pressure chronic ulcer of other part of right lower leg with fat layer exposed I87.2 Venous insufficiency (chronic) (peripheral) I73.89 Other specified peripheral vascular diseases Wound Cleansing Wound #1 Right,Medial Lower Leg o Clean wound with Normal Saline. o Dial antibacterial soap, wash wounds, rinse and pat dry prior to dressing wounds Anesthetic (add to Medication List) Wound #1 Right,Medial Lower Leg o Topical Lidocaine 4% cream applied to wound bed prior to debridement (In Clinic Only). Primary Wound Dressing Wound #1 Right,Medial Lower Leg o Santyl Ointment - saline moistened  gauze Secondary Dressing Wound #1 Right,Medial Lower Leg o ABD and Kerlix/Conform Dressing Change Frequency Wound #1 Right,Medial Lower Leg o Change dressing every day. Follow-up Appointments Wound #1 Right,Medial Lower Leg o Return Appointment in 1 week. Edema Control Wound #1 Right,Medial Lower Leg   o Other: - Tubi-grip F wear daily for swelling Additional Orders / Instructions Wound #1 Right,Medial Lower Leg o Increase protein intake. Patient Medications Allergies: penicillin Notifications Medication Indication Start End clindamycin HCl 07/21/2020 DOSE 1 - oral 300 mg capsule - 1 capsule oral taken 3 times per day for 7 days SHANTE, ARCHAMBEAULT ([id number]) Electronic Signature(s) Signed: 07/21/2020 4:31:24 PM By: Worthy Keeler PA-C Entered By: Worthy Keeler on 07/21/2020 16:31:24 [name] ([id number]) -------------------------------------------------------------------------------- Problem List Details Patient Name: [name]. Date of Service: 07/21/2020 3:45 PM Medical Record Number: [id number] Patient Account Number: [id number] Date of Birth/Sex: [birth year] (68 y.o. F) Treating RN: Cornell Barman Primary Care Provider: Otilio Miu Other Clinician: Referring Provider: Otilio Miu Treating Provider/Extender: Melburn Hake, Quincee Gittens Weeks in Treatment: 4 Active Problems ICD-10 Encounter Code Description Active Date MDM Diagnosis S81.801A Unspecified open wound, right lower leg, initial encounter 06/20/2020 No Yes L97.812 Non-pressure chronic ulcer of other part of right lower leg with fat layer 06/20/2020 No Yes exposed I87.2 Venous insufficiency (chronic) (peripheral) 06/20/2020 No Yes I73.89 Other specified peripheral vascular diseases 06/20/2020 No Yes Inactive Problems Resolved Problems Electronic Signature(s) Signed: 07/21/2020 3:47:25 PM By: Worthy Keeler PA-C Entered By: Worthy Keeler on 07/21/2020 15:47:24 [name]  ([id number]) -------------------------------------------------------------------------------- Progress Note Details Patient Name: [name]. Date of Service: 07/21/2020 3:45 PM Medical Record Number: [id number] Patient Account Number: [id number] Date of Birth/Sex: 01-18-1951 (68 y.o. F) Treating RN: Cornell Barman Primary Care Provider: Otilio Miu Other Clinician: Referring Provider: Otilio Miu Treating Provider/Extender: Melburn Hake, Chania Kochanski Weeks in Treatment: 4 Subjective Chief Complaint Information obtained from Patient Right LE Ulcer History of Present Illness (HPI) 06/20/2020 upon evaluation today patient presents for initial evaluation here at clinic concerning wound on her right lower leg secondary to having struck this on a metal chair out in her yard in May 2021. She tells me that this has been managed at this point by her primary care provider Dr. Ronnald Ramp. With that being said Dr. Ronnald Ramp did make a referral to [place] to further evaluate this situation as well. The patient has been on doxycycline though she has not at this point and has been using the mupirocin as prescribed by Dr. Ronnald Ramp. She does have a history of venous insufficiency and has had some previous vascular work-up including an ablation secondary to a blood clot that she had in her leg. Subsequently she also has a slightly elevated ABI indicating likely some hardening of the arteries in general but nonetheless she seems to have good blood flow into the foot there is no evidence of cyanosis or poor capillary refill. The patient does have pain at the site of the injury. He tells me this did not bruise but nonetheless it seems to be somewhat deep. 07/05/2020 upon evaluation today patient unfortunately does not appear to be doing as well as she was previous. There is no signs of active infection at this time which is good news I really felt like after the debridement we performed last time she would be doing much better but  she really is not. She still has a lot of necrotic tissue is very tender and actually has some erythema around the wound. Overall I am concerned that things do not seem to be improving quite as effectively and as much as I would like to see. 07/14/2020 on evaluation today patient appears to still be doing somewhat poorly in regard to her leg ulcer. This does still appear to be  infected. I did call in a prescription for clindamycin but the patient apparently missed a message in that regard. This is at the pharmacy for her although originally written for 2 times a day dosing I really want her to take it 3 times a day I told her that as well during conversation in the clinic. This is based on the severity of the wound and what I am seeing today. 07/21/20 upon evaluation patient appears to be doing well with regard to her wound as compared to previous. Fortunately there is no signs of active infection I do believe the clindamycin has done better for her since she started taking that. With that being said I do need to extend this for her because I did have her take it 3 times a day and it was written for 2 times a day. She is in agreement with that plan. Objective Constitutional Well-nourished and well-hydrated in no acute distress. Vitals Time Taken: 3:45 AM, Height: 68 in, Weight: 184 lbs, BMI: 28, Temperature: 98.8 F, Pulse: 83 bpm, Respiratory Rate: 18 breaths/min, Blood Pressure: 123/71 mmHg. Respiratory normal breathing without difficulty. Psychiatric this patient is able to make decisions and demonstrates good insight into disease process. Alert and Oriented x 3. pleasant and cooperative. General Notes: Upon inspection patient's wound bed actually showed signs of good granulation at this time there does not appear to be any evidence of infection she does have some slough noted but the Santyl is helping to clean this away which is good news. Overall I'm very pleased with where she stands  currently. Integumentary (Hair, Skin) Wound #1 status is Open. Original cause of wound was Trauma. The wound is located on the Right,Medial Lower Leg. The wound measures 1.5cm length x 1.5cm width x 0.4cm depth; 1.767cm^2 area and 0.707cm^3 volume. There is Fat Layer (Subcutaneous Tissue) Exposed exposed. There is a medium amount of serosanguineous drainage noted. The wound margin is flat and intact. There is medium (34-66%) pink granulation within the wound bed. There is a medium (34-66%) amount of necrotic tissue within the wound bed including Adherent Slough. MIKAIA, JANVIER ([id number]) Assessment Active Problems ICD-10 Unspecified open wound, right lower leg, initial encounter Non-pressure chronic ulcer of other part of right lower leg with fat layer exposed Venous insufficiency (chronic) (peripheral) Other specified peripheral vascular diseases Plan Wound Cleansing: Wound #1 Right,Medial Lower Leg: Clean wound with Normal Saline. Dial antibacterial soap, wash wounds, rinse and pat dry prior to dressing wounds Anesthetic (add to Medication List): Wound #1 Right,Medial Lower Leg: Topical Lidocaine 4% cream applied to wound bed prior to debridement (In Clinic Only). Primary Wound Dressing: Wound #1 Right,Medial Lower Leg: Santyl Ointment - saline moistened gauze Secondary Dressing: Wound #1 Right,Medial Lower Leg: ABD and Kerlix/Conform Dressing Change Frequency: Wound #1 Right,Medial Lower Leg: Change dressing every day. Follow-up Appointments: Wound #1 Right,Medial Lower Leg: Return Appointment in 1 week. Edema Control: Wound #1 Right,Medial Lower Leg: Other: - Tubi-grip F wear daily for swelling Additional Orders / Instructions: Wound #1 Right,Medial Lower Leg: Increase protein intake. The following medication(s) was prescribed: clindamycin HCl oral 300 mg capsule 1 1 capsule oral taken 3 times per day for 7 days starting 07/21/2020 1. I would recommend that we  going to continue with the wound care measures as before with regard to the Santyl that I feel like his job for her. 2. I would recommend as well she continue to monitor for any signs of worsening infection though I do believe the  clindamycin is doing well for her likely she'll still continue to have good improvement with this. 3. I'm also can recommend she continue to wear the Tubigrip over top which I think is helping with edema control. We will see patient back for reevaluation in 1 week here in the clinic. If anything worsens or changes patient will contact our office for additional recommendations. Electronic Signature(s) Signed: 07/21/2020 4:31:35 PM By: Worthy Keeler PA-C Entered By: Worthy Keeler on 07/21/2020 16:31:35 Vo, Loran Senters ([id number]) -------------------------------------------------------------------------------- SuperBill Details Patient Name: [name]. Date of Service: 07/21/2020 Medical Record Number: [id number] Patient Account Number: [id number] Date of Birth/Sex: 1951/06/12 (68 y.o. F) Treating RN: Cornell Barman Primary Care Provider: Otilio Miu Other Clinician: Referring Provider: Otilio Miu Treating Provider/Extender: Melburn Hake, Micheale Schlack Weeks in Treatment: 4 Diagnosis Coding ICD-10 Codes Code Description [phone] Unspecified open wound, right lower leg, initial encounter L97.812 Non-pressure chronic ulcer of other part of right lower leg with fat layer exposed I87.2 Venous insufficiency (chronic) (peripheral) I73.89 Other specified peripheral vascular diseases Facility Procedures CPT4 Code: [id number] Description: [phone] - DEBRIDE W/O ANES NON SELECT Modifier: Quantity: 1 Physician Procedures CPT4 Code: [id number] Description: [id number] - WC PHYS LEVEL 4 - EST PT Modifier: Quantity: 1 CPT4 Code: Description: ICD-10 Diagnosis Description S81.801A Unspecified open wound, right lower leg, initial encounter D47.092 Non-pressure chronic ulcer of  other part of right lower leg with fat la I87.2 Venous insufficiency (chronic) (peripheral) I73.89 Other  specified peripheral vascular diseases Modifier: yer exposed Quantity: Electronic Signature(s) Signed: 07/21/2020 5:50:38 PM By: Gretta Cool, BSN, RN, CWS, Kim RN, BSN Signed: 07/22/2020 8:26:21 AM By: Worthy Keeler PA-C Previous Signature: 07/21/2020 5:50:30 PM Version By: Gretta Cool, BSN, RN, CWS, Kim RN, BSN Previous Signature: 07/21/2020 4:32:07 PM Version By: Worthy Keeler PA-C Entered By: Gretta Cool, BSN, RN, CWS, Kim on 07/21/2020 17:50:38

## 2020-07-21 NOTE — Progress Notes (Addendum)
Kathryn, Le ([id number]) Visit Report for 07/21/2020 Arrival Information Details Patient Name: [name], [name]. Date of Service: 07/21/2020 3:45 PM Medical Record Number: [id number] Patient Account Number: [id number] Date of Birth/Sex: June 27, 1951 (68 y.o. F) Treating RN: Cornell Barman Primary Care Meko Bellanger: Otilio Miu Other Clinician: Referring Kaydra Borgen: Otilio Miu Treating Juliona Vales/Extender: Melburn Hake, HOYT Weeks in Treatment: 4 Visit Information History Since Last Visit All ordered tests and consults were completed: No Patient Arrived: Ambulatory Added or deleted any medications: No Arrival Time: 15:59 Any new allergies or adverse reactions: No Accompanied By: self Had a fall or experienced change in No Transfer Assistance: None activities of daily living that may affect Patient Identification Verified: Yes risk of falls: Secondary Verification Process Completed: Yes Signs or symptoms of abuse/neglect since last visito No Patient Requires Transmission-Based No Hospitalized since last visit: No Precautions: Implantable device outside of the clinic excluding No Patient Has Alerts: Yes cellular tissue based products placed in the center Patient Alerts: Patient on Blood since last visit: Thinner Has Dressing in Place as Prescribed: Yes Warfarin Has Compression in Place as Prescribed: Yes Pain Present Now: No Electronic Signature(s) Signed: 07/22/2020 8:00:18 AM By: Darci Needle Entered By: Darci Needle on 07/21/2020 16:00:10 [name] ([id number]) -------------------------------------------------------------------------------- Encounter Discharge Information Details Patient Name: [name]. Date of Service: 07/21/2020 3:45 PM Medical Record Number: [id number] Patient Account Number: [id number] Date of Birth/Sex: 04-04-1951 (68 y.o. F) Treating RN: Cornell Barman Primary Care Krislyn Donnan: Otilio Miu Other Clinician: Referring Brenly Trawick:  Otilio Miu Treating Tiyon Sanor/Extender: Melburn Hake, HOYT Weeks in Treatment: 4 Encounter Discharge Information Items Post Procedure Vitals Discharge Condition: Stable Unable to obtain vitals Reason: . Ambulatory Status: Ambulatory Discharge Destination: Home Transportation: Private Auto Accompanied By: self Schedule Follow-up Appointment: Yes Clinical Summary of Care: Electronic Signature(s) Signed: 07/21/2020 5:23:56 PM By: Gretta Cool, BSN, RN, CWS, Kim RN, BSN Entered By: Gretta Cool, BSN, RN, CWS, Kim on 07/21/2020 17:23:55 [name] ([id number]) -------------------------------------------------------------------------------- Lower Extremity Assessment Details Patient Name: Kathryn, Le. Date of Service: 07/21/2020 3:45 PM Medical Record Number: [id number] Patient Account Number: [id number] Date of Birth/Sex: Dec 15, 1950 (68 y.o. F) Treating RN: Cornell Barman Primary Care Honest Safranek: Otilio Miu Other Clinician: Referring Apolo Cutshaw: Otilio Miu Treating Isahia Hollerbach/Extender: STONE III, HOYT Weeks in Treatment: 4 Edema Assessment Assessed: [Left: No] [Right: Yes] Edema: [Left: Ye] [Right: s] Calf Left: Right: Point of Measurement: 30 cm From Medial Instep cm 35 cm Ankle Left: Right: Point of Measurement: 12 cm From Medial Instep cm 23 cm Vascular Assessment Pulses: Dorsalis Pedis Palpable: [Right:Yes] Posterior Tibial Palpable: [Right:Yes] Electronic Signature(s) Signed: 07/21/2020 6:39:29 PM By: Gretta Cool, BSN, RN, CWS, Kim RN, BSN Signed: 07/22/2020 8:00:18 AM By: Darci Needle Entered By: Darci Needle on 07/21/2020 16:05:36 [name] ([id number]) -------------------------------------------------------------------------------- Multi Wound Chart Details Patient Name: [name]. Date of Service: 07/21/2020 3:45 PM Medical Record Number: [id number] Patient Account Number: [id number] Date of Birth/Sex: 02/01/1951 (68 y.o. F) Treating RN: Cornell Barman Primary Care Trenity Pha: Otilio Miu Other Clinician: Referring Solly Derasmo: Otilio Miu Treating Vibha Ferdig/Extender: STONE III, HOYT Weeks in Treatment: 4 Vital Signs Height(in): 68 Pulse(bpm): 83 Weight(lbs): 184 Blood Pressure(mmHg): 123/71 Body Mass Index(BMI): 28 Temperature(F): 98.8 Respiratory Rate(breaths/min): 18 Photos: [N/A:N/A] Wound Location: Right, Medial Lower Leg N/A N/A Wounding Event: Trauma N/A N/A Primary Etiology: Trauma, Other N/A N/A Comorbid History: Osteoarthritis N/A N/A Date Acquired: 04/02/2020 N/A N/A Weeks of Treatment: 4 N/A N/A Wound Status: Open N/A N/A Measurements L x W x D (cm) 1.5x1.5x0.4 N/A N/A  Area (cm) : 1.767 N/A N/A Volume (cm) : 0.707 N/A N/A % Reduction in Area: -104.50% N/A N/A % Reduction in Volume: -722.10% N/A N/A Classification: Full Thickness Without Exposed N/A N/A Support Structures Exudate Amount: Medium N/A N/A Exudate Type: Serosanguineous N/A N/A Exudate Color: red, brown N/A N/A Wound Margin: Flat and Intact N/A N/A Granulation Amount: Medium (34-66%) N/A N/A Granulation Quality: Pink N/A N/A Necrotic Amount: Medium (34-66%) N/A N/A Exposed Structures: Fat Layer (Subcutaneous Tissue) N/A N/A Exposed: Yes Epithelialization: None N/A N/A Treatment Notes Electronic Signature(s) Signed: 07/21/2020 6:39:29 PM By: Gretta Cool, BSN, RN, CWS, Kim RN, BSN Entered By: Gretta Cool, BSN, RN, CWS, Kim on 07/21/2020 16:12:27 [name] ([id number]) -------------------------------------------------------------------------------- [place] Details Patient Name: Le, Kathryn. Date of Service: 07/21/2020 3:45 PM Medical Record Number: [id number] Patient Account Number: [id number] Date of Birth/Sex: 29-Aug-1951 (68 y.o. F) Treating RN: Cornell Barman Primary Care Jaquel Glassburn: Otilio Miu Other Clinician: Referring Tabb Croghan: Otilio Miu Treating Denea Cheaney/Extender: Melburn Hake, HOYT Weeks in Treatment:  4 Active Inactive Necrotic Tissue Nursing Diagnoses: Impaired tissue integrity related to necrotic/devitalized tissue Goals: Necrotic/devitalized tissue will be minimized in the wound bed Date Initiated: 06/20/2020 Target Resolution Date: 06/27/2020 Goal Status: Active Interventions: Assess patient pain level pre-, during and post procedure and prior to discharge Treatment Activities: Apply topical anesthetic as ordered : 06/20/2020 Excisional debridement : 06/20/2020 Notes: Orientation to the Wound Care Program Nursing Diagnoses: Knowledge deficit related to the wound healing center program Goals: Patient/caregiver will verbalize understanding of the [place] Date Initiated: 06/20/2020 Target Resolution Date: 06/27/2020 Goal Status: Active Interventions: Provide education on orientation to the wound center Notes: Wound/Skin Impairment Nursing Diagnoses: Impaired tissue integrity Goals: Ulcer/skin breakdown will have a volume reduction of 30% by week 4 Date Initiated: 06/20/2020 Target Resolution Date: 07/21/2020 Goal Status: Active Interventions: Assess patient/caregiver ability to obtain necessary supplies Assess ulceration(s) every visit Treatment Activities: Skin care regimen initiated : 06/20/2020 Notes: Electronic Signature(s) GIANI, WINTHER ([id number]) Signed: 07/21/2020 6:39:29 PM By: Gretta Cool, BSN, RN, CWS, Kim RN, BSN Entered By: Gretta Cool, BSN, RN, CWS, Kim on 07/21/2020 16:12:16 [name] ([id number]) -------------------------------------------------------------------------------- Pain Assessment Details Patient Name: [name]. Date of Service: 07/21/2020 3:45 PM Medical Record Number: [id number] Patient Account Number: [id number] Date of Birth/Sex: [birth year] (68 y.o. F) Treating RN: Cornell Barman Primary Care Kyri Dai: Otilio Miu Other Clinician: Referring Deisha Stull: Otilio Miu Treating Malique Driskill/Extender: Melburn Hake,  HOYT Weeks in Treatment: 4 Active Problems Location of Pain Severity and Description of Pain Patient Has Paino No Site Locations With Dressing Change: No Pain Management and Medication Current Pain Management: Electronic Signature(s) Signed: 07/21/2020 6:39:29 PM By: Gretta Cool, BSN, RN, CWS, Kim RN, BSN Signed: 07/22/2020 8:00:18 AM By: Darci Needle Entered By: Darci Needle on 07/21/2020 16:01:28 [name] ([id number]) -------------------------------------------------------------------------------- Patient/Caregiver Education Details Patient Name: SOMMER, SPICKARD. Date of Service: 07/21/2020 3:45 PM Medical Record Number: [id number] Patient Account Number: [id number] Date of Birth/Gender: 06-09-1951 (68 y.o. F) Treating RN: Cornell Barman Primary Care Physician: Otilio Miu Other Clinician: Referring Physician: Otilio Miu Treating Physician/Extender: Melburn Hake, HOYT Weeks in Treatment: 4 Education Assessment Education Provided To: Patient Education Topics Provided Wound/Skin Impairment: Handouts: Caring for Your Ulcer Methods: Demonstration, Explain/Verbal Responses: State content correctly Electronic Signature(s) Signed: 07/21/2020 6:39:29 PM By: Gretta Cool, BSN, RN, CWS, Kim RN, BSN Entered By: Gretta Cool, BSN, RN, CWS, Kim on 07/21/2020 17:20:22 [name] ([id number]) -------------------------------------------------------------------------------- Wound Assessment Details Patient Name: CHESTINA, KOMATSU. Date of Service: 07/21/2020 3:45  PM Medical Record Number: [id number] Patient Account Number: [id number] Date of Birth/Sex: 22-Oct-1951 (68 y.o. F) Treating RN: Cornell Barman Primary Care Dekisha Mesmer: Otilio Miu Other Clinician: Referring Beckham Buxbaum: Otilio Miu Treating Althia Egolf/Extender: STONE III, HOYT Weeks in Treatment: 4 Wound Status Wound Number: 1 Primary Etiology: Trauma, Other Wound Location: Right, Medial Lower Leg Wound Status:  Open Wounding Event: Trauma Comorbid History: Osteoarthritis Date Acquired: 04/02/2020 Weeks Of Treatment: 4 Clustered Wound: No Photos Wound Measurements Length: (cm) 1.5 Width: (cm) 1.5 Depth: (cm) 0.4 Area: (cm) 1.767 Volume: (cm) 0.707 % Reduction in Area: -104.5% % Reduction in Volume: -722.1% Epithelialization: None Wound Description Classification: Full Thickness Without Exposed Support Structu Wound Margin: Flat and Intact Exudate Amount: Medium Exudate Type: Serosanguineous Exudate Color: red, brown res Foul Odor After Cleansing: No Slough/Fibrino Yes Wound Bed Granulation Amount: Medium (34-66%) Exposed Structure Granulation Quality: Pink Fat Layer (Subcutaneous Tissue) Exposed: Yes Necrotic Amount: Medium (34-66%) Necrotic Quality: Adherent Slough Treatment Notes Wound #1 (Right, Medial Lower Leg) Notes Santyl, saline moistened gauze, conform, Tubi-grip F Electronic Signature(s) Signed: 07/21/2020 6:39:29 PM By: Gretta Cool, BSN, RN, CWS, Kim RN, BSN Signed: 07/22/2020 8:00:18 AM By: Darci Needle Entered By: Darci Needle on 07/21/2020 16:03:24 [name] ([id number]) DENECE, SHEARER ([id number]) -------------------------------------------------------------------------------- Vitals Details Patient Name: UNIQUE, SILLAS. Date of Service: 07/21/2020 3:45 PM Medical Record Number: [id number] Patient Account Number: [id number] Date of Birth/Sex: [birth year] (68 y.o. F) Treating RN: Cornell Barman Primary Care Novalee Horsfall: Otilio Miu Other Clinician: Referring Jatavious Peppard: Otilio Miu Treating Shahidah Nesbitt/Extender: STONE III, HOYT Weeks in Treatment: 4 Vital Signs Time Taken: 03:45 Temperature (F): 98.8 Height (in): 68 Pulse (bpm): 83 Weight (lbs): 184 Respiratory Rate (breaths/min): 18 Body Mass Index (BMI): 28 Blood Pressure (mmHg): 123/71 Reference Range: 80 - 120 mg / dl Electronic Signature(s) Signed: 07/22/2020 8:00:18 AM By: Darci Needle Entered By: Darci Needle on 07/21/2020 16:01:17

## 2020-07-21 NOTE — Telephone Encounter (Signed)
Requested medication (s) are due for refill today: Yes  Requested medication (s) are on the active medication list: Yes  Last refill:  02/10/20  Future visit scheduled: No  Notes to clinic:  Pharmacy asking for diagnosis code and 90 day supply.    Requested Prescriptions  Pending Prescriptions Disp Refills   traZODone (DESYREL) 50 MG tablet [Pharmacy Med Name: TRAZODONE 50 MG TABLET] 90 tablet 2    Sig: TAKE 1 TABLET BY MOUTH EVERY DAY      Psychiatry: Antidepressants - Serotonin Modulator Passed - 07/21/2020  9:31 AM      Passed - Completed PHQ-2 or PHQ-9 in the last 360 days.      Passed - Valid encounter within last 6 months    Recent Outpatient Visits           1 month ago Mild memory disturbance   [facility] Juline Patch, MD   1 month ago Cellulitis of right lower extremity   [facility] Juline Patch, MD   2 months ago Anticoagulant long-term use   [facility] Juline Patch, MD   2 months ago Cellulitis of right lower extremity   [facility] Juline Patch, MD   5 months ago Taking medication for chronic disease   [facility] Juline Patch, MD

## 2020-07-22 ENCOUNTER — Telehealth: Payer: Self-pay | Admitting: Family Medicine

## 2020-07-22 NOTE — Telephone Encounter (Unsigned)
Copied from [place] [id number]. Topic: General - Inquiry >> Jul 22, 2020  9:23 AM Scherrie Gerlach wrote: Reason for CRM: daughter called for pt to advise the pt's neurologist wants her to have a B12 and a thyroid test done.  She calling to set that up. Please advise

## 2020-07-22 NOTE — Telephone Encounter (Signed)
Left a message

## 2020-07-28 ENCOUNTER — Encounter: Payer: Medicare Other | Admitting: Physician Assistant

## 2020-07-28 DIAGNOSIS — I872 Venous insufficiency (chronic) (peripheral): Secondary | ICD-10-CM | POA: Diagnosis not present

## 2020-07-28 DIAGNOSIS — L97812 Non-pressure chronic ulcer of other part of right lower leg with fat layer exposed: Secondary | ICD-10-CM | POA: Diagnosis not present

## 2020-07-28 NOTE — Progress Notes (Addendum)
Kathryn, Le ([id number]) Visit Report for 07/28/2020 Chief Complaint Document Details Patient Name: [name], [name]. Date of Service: 07/28/2020 2:45 PM Medical Record Number: [id number] Patient Account Number: [id number] Date of Birth/Sex: August 28, 1951 (68 y.o. F) Treating RN: Cornell Barman Primary Care Provider: Otilio Miu Other Clinician: Referring Provider: Otilio Miu Treating Provider/Extender: Melburn Hake, Tarahji Ramthun Weeks in Treatment: 5 Information Obtained from: Patient Chief Complaint Right LE Ulcer Electronic Signature(s) Signed: 07/28/2020 3:13:00 PM By: Worthy Keeler PA-C Entered By: Worthy Keeler on 07/28/2020 15:13:00 [name] ([id number]) -------------------------------------------------------------------------------- Debridement Details Patient Name: [name]. Date of Service: 07/28/2020 2:45 PM Medical Record Number: [id number] Patient Account Number: [id number] Date of Birth/Sex: [birth year] (68 y.o. F) Treating RN: Grover Canavan Primary Care Provider: Otilio Miu Other Clinician: Referring Provider: Otilio Miu Treating Provider/Extender: STONE III, Kolette Vey Weeks in Treatment: 5 Debridement Performed for Wound #1 Right,Medial Lower Leg Assessment: Performed By: Physician STONE III, Jaxiel Kines E., PA-C Debridement Type: Debridement Level of Consciousness (Pre- Awake and Alert procedure): Pre-procedure Verification/Time Out Yes - 15:16 Taken: Start Time: 15:17 Pain Control: Lidocaine Total Area Debrided (L x W): 1 (cm) x 1.3 (cm) = 1.3 (cm) Tissue and other material Slough, Subcutaneous, Slough, Hyper-granulation debrided: Level: Skin/Subcutaneous Tissue Debridement Description: Excisional Instrument: Curette Bleeding: Moderate Hemostasis Achieved: Pressure End Time: 15:19 Procedural Pain: 0 Post Procedural Pain: 0 Response to Treatment: Procedure was tolerated well Level of Consciousness (Post- Awake and  Alert procedure): Post Debridement Measurements of Total Wound Length: (cm) 1 Width: (cm) 1.3 Depth: (cm) 0.4 Volume: (cm) 0.408 Character of Wound/Ulcer Post Debridement: Improved Post Procedure Diagnosis Same as Pre-procedure Electronic Signature(s) Signed: 07/28/2020 4:54:16 PM By: Grover Canavan Signed: 07/28/2020 5:28:41 PM By: Worthy Keeler PA-C Entered By: Grover Canavan on 07/28/2020 15:19:57 [name] ([id number]) -------------------------------------------------------------------------------- HPI Details Patient Name: [name]. Date of Service: 07/28/2020 2:45 PM Medical Record Number: [id number] Patient Account Number: [id number] Date of Birth/Sex: 1951-05-14 (68 y.o. F) Treating RN: Cornell Barman Primary Care Provider: Otilio Miu Other Clinician: Referring Provider: Otilio Miu Treating Provider/Extender: Melburn Hake, Avonlea Sima Weeks in Treatment: 5 History of Present Illness HPI Description: 06/20/2020 upon evaluation today patient presents for initial evaluation here at clinic concerning wound on her right lower leg secondary to having struck this on a metal chair out in her yard in May 2021. She tells me that this has been managed at this point by her primary care provider Dr. Ronnald Ramp. With that being said Dr. Ronnald Ramp did make a referral to [place] to further evaluate this situation as well. The patient has been on doxycycline though she has not at this point and has been using the mupirocin as prescribed by Dr. Ronnald Ramp. She does have a history of venous insufficiency and has had some previous vascular work-up including an ablation secondary to a blood clot that she had in her leg. Subsequently she also has a slightly elevated ABI indicating likely some hardening of the arteries in general but nonetheless she seems to have good blood flow into the foot there is no evidence of cyanosis or poor capillary refill. The patient does have pain at the site of the  injury. He tells me this did not bruise but nonetheless it seems to be somewhat deep. 07/05/2020 upon evaluation today patient unfortunately does not appear to be doing as well as she was previous. There is no signs of active infection at this time which is good news I really felt like after the debridement we performed  last time she would be doing much better but she really is not. She still has a lot of necrotic tissue is very tender and actually has some erythema around the wound. Overall I am concerned that things do not seem to be improving quite as effectively and as much as I would like to see. 07/14/2020 on evaluation today patient appears to still be doing somewhat poorly in regard to her leg ulcer. This does still appear to be infected. I did call in a prescription for clindamycin but the patient apparently missed a message in that regard. This is at the pharmacy for her although originally written for 2 times a day dosing I really want her to take it 3 times a day I told her that as well during conversation in the clinic. This is based on the severity of the wound and what I am seeing today. 07/21/20 upon evaluation patient appears to be doing well with regard to her wound as compared to previous. Fortunately there is no signs of active infection I do believe the clindamycin has done better for her since she started taking that. With that being said I do need to extend this for her because I did have her take it 3 times a day and it was written for 2 times a day. She is in agreement with that plan. 07/28/2020 on evaluation today patient actually appears to be doing quite well with regard to her leg ulcer. This is making great progress in my opinion and overall very pleased with where things stand. She does not seem to show any signs of infection right now which is also excellent. Electronic Signature(s) Signed: 07/28/2020 3:33:27 PM By: Worthy Keeler PA-C Entered By: Worthy Keeler on  07/28/2020 15:33:27 [name] ([id number]) -------------------------------------------------------------------------------- Physical Exam Details Patient Name: VELICIA, DEJAGER. Date of Service: 07/28/2020 2:45 PM Medical Record Number: [id number] Patient Account Number: [id number] Date of Birth/Sex: 11/08/1951 (68 y.o. F) Treating RN: Cornell Barman Primary Care Provider: Otilio Miu Other Clinician: Referring Provider: Otilio Miu Treating Provider/Extender: STONE III, Kendyl Festa Weeks in Treatment: 5 Constitutional Well-nourished and well-hydrated in no acute distress. Respiratory normal breathing without difficulty. Psychiatric this patient is able to make decisions and demonstrates good insight into disease process. Alert and Oriented x 3. pleasant and cooperative. Notes Patient's wound bed did have some slough noted at this point I did perform sharp debridement clear away some of the necrotic debris she tolerated that with some pain but overall did very well. Post debridement wound bed appears better and overall I think she is headed in the right track to get this closed. Electronic Signature(s) Signed: 07/28/2020 3:33:47 PM By: Worthy Keeler PA-C Entered By: Worthy Keeler on 07/28/2020 15:33:47 [name] ([id number]) -------------------------------------------------------------------------------- Physician Orders Details Patient Name: LUVA, METZGER. Date of Service: 07/28/2020 2:45 PM Medical Record Number: [id number] Patient Account Number: [id number] Date of Birth/Sex: 14-Jul-1951 (68 y.o. F) Treating RN: Grover Canavan Primary Care Provider: Otilio Miu Other Clinician: Referring Provider: Otilio Miu Treating Provider/Extender: Melburn Hake, Jamarrius [name] Weeks in Treatment: 5 Verbal / Phone Orders: No Diagnosis Coding ICD-10 Coding Code Description S81.801A Unspecified open wound, right lower leg, initial encounter L97.812 Non-pressure chronic  ulcer of other part of right lower leg with fat layer exposed I87.2 Venous insufficiency (chronic) (peripheral) I73.89 Other specified peripheral vascular diseases Wound Cleansing Wound #1 Right,Medial Lower Leg o Clean wound with Normal Saline. o Dial antibacterial soap, wash wounds, rinse and pat dry prior  to dressing wounds Anesthetic (add to Medication List) Wound #1 Right,Medial Lower Leg o Topical Lidocaine 4% cream applied to wound bed prior to debridement (In Clinic Only). Primary Wound Dressing Wound #1 Right,Medial Lower Leg o Hydrafera Blue Ready Transfer Secondary Dressing Wound #1 Right,Medial Lower Leg o ABD and Kerlix/Conform Dressing Change Frequency Wound #1 Right,Medial Lower Leg o Three times weekly Follow-up Appointments Wound #1 Right,Medial Lower Leg o Return Appointment in 1 week. Edema Control Wound #1 Right,Medial Lower Leg o Other: - Tubi-grip F wear daily for swelling Additional Orders / Instructions Wound #1 Right,Medial Lower Leg o Increase protein intake. Electronic Signature(s) Signed: 07/28/2020 4:54:16 PM By: Grover Canavan Signed: 07/28/2020 5:28:41 PM By: Worthy Keeler PA-C Entered By: Grover Canavan on 07/28/2020 15:23:52 [name] ([id number]) -------------------------------------------------------------------------------- Problem List Details Patient Name: AUBRYN, SPINOLA. Date of Service: 07/28/2020 2:45 PM Medical Record Number: [id number] Patient Account Number: [id number] Date of Birth/Sex: 06-23-1951 (68 y.o. F) Treating RN: Cornell Barman Primary Care Provider: Otilio Miu Other Clinician: Referring Provider: Otilio Miu Treating Provider/Extender: Melburn Hake, Xan Ingraham Weeks in Treatment: 5 Active Problems ICD-10 Encounter Code Description Active Date MDM Diagnosis S81.801A Unspecified open wound, right lower leg, initial encounter 06/20/2020 No Yes L97.812 Non-pressure chronic ulcer of other  part of right lower leg with fat layer 06/20/2020 No Yes exposed I87.2 Venous insufficiency (chronic) (peripheral) 06/20/2020 No Yes I73.89 Other specified peripheral vascular diseases 06/20/2020 No Yes Inactive Problems Resolved Problems Electronic Signature(s) Signed: 07/28/2020 3:12:42 PM By: Worthy Keeler PA-C Entered By: Worthy Keeler on 07/28/2020 15:12:42 [name] ([id number]) -------------------------------------------------------------------------------- Progress Note Details Patient Name: [name]. Date of Service: 07/28/2020 2:45 PM Medical Record Number: [id number] Patient Account Number: [id number] Date of Birth/Sex: Apr 29, 1951 (68 y.o. F) Treating RN: Cornell Barman Primary Care Provider: Otilio Miu Other Clinician: Referring Provider: Otilio Miu Treating Provider/Extender: Melburn Hake, Ladrea Holladay Weeks in Treatment: 5 Subjective Chief Complaint Information obtained from Patient Right LE Ulcer History of Present Illness (HPI) 06/20/2020 upon evaluation today patient presents for initial evaluation here at clinic concerning wound on her right lower leg secondary to having struck this on a metal chair out in her yard in May 2021. She tells me that this has been managed at this point by her primary care provider Dr. Ronnald Ramp. With that being said Dr. Ronnald Ramp did make a referral to [place] to further evaluate this situation as well. The patient has been on doxycycline though she has not at this point and has been using the mupirocin as prescribed by Dr. Ronnald Ramp. She does have a history of venous insufficiency and has had some previous vascular work-up including an ablation secondary to a blood clot that she had in her leg. Subsequently she also has a slightly elevated ABI indicating likely some hardening of the arteries in general but nonetheless she seems to have good blood flow into the foot there is no evidence of cyanosis or poor capillary refill. The patient does have  pain at the site of the injury. He tells me this did not bruise but nonetheless it seems to be somewhat deep. 07/05/2020 upon evaluation today patient unfortunately does not appear to be doing as well as she was previous. There is no signs of active infection at this time which is good news I really felt like after the debridement we performed last time she would be doing much better but she really is not. She still has a lot of necrotic tissue is very tender and  actually has some erythema around the wound. Overall I am concerned that things do not seem to be improving quite as effectively and as much as I would like to see. 07/14/2020 on evaluation today patient appears to still be doing somewhat poorly in regard to her leg ulcer. This does still appear to be infected. I did call in a prescription for clindamycin but the patient apparently missed a message in that regard. This is at the pharmacy for her although originally written for 2 times a day dosing I really want her to take it 3 times a day I told her that as well during conversation in the clinic. This is based on the severity of the wound and what I am seeing today. 07/21/20 upon evaluation patient appears to be doing well with regard to her wound as compared to previous. Fortunately there is no signs of active infection I do believe the clindamycin has done better for her since she started taking that. With that being said I do need to extend this for her because I did have her take it 3 times a day and it was written for 2 times a day. She is in agreement with that plan. 07/28/2020 on evaluation today patient actually appears to be doing quite well with regard to her leg ulcer. This is making great progress in my opinion and overall very pleased with where things stand. She does not seem to show any signs of infection right now which is also excellent. Objective Constitutional Well-nourished and well-hydrated in no acute distress. Vitals  Time Taken: 2:45 AM, Height: 68 in, Weight: 184 lbs, BMI: 28, Temperature: 98.3 F, Pulse: 71 bpm, Respiratory Rate: 18 breaths/min, Blood Pressure: 134/82 mmHg. Respiratory normal breathing without difficulty. Psychiatric this patient is able to make decisions and demonstrates good insight into disease process. Alert and Oriented x 3. pleasant and cooperative. General Notes: Patient's wound bed did have some slough noted at this point I did perform sharp debridement clear away some of the necrotic debris she tolerated that with some pain but overall did very well. Post debridement wound bed appears better and overall I think she is headed in the right track to get this closed. Integumentary (Hair, Skin) Wound #1 status is Open. Original cause of wound was Trauma. The wound is located on the Right,Medial Lower Leg. The wound measures 1cm length x 1.3cm width x 0.2cm depth; 1.021cm^2 area and 0.204cm^3 volume. There is Fat Layer (Subcutaneous Tissue) exposed. There is a medium amount of serosanguineous drainage noted. The wound margin is flat and intact. There is medium (34-66%) pink granulation within the [place] ([id number]) wound bed. There is a medium (34-66%) amount of necrotic tissue within the wound bed including Adherent Slough. Assessment Active Problems ICD-10 Unspecified open wound, right lower leg, initial encounter Non-pressure chronic ulcer of other part of right lower leg with fat layer exposed Venous insufficiency (chronic) (peripheral) Other specified peripheral vascular diseases Procedures Wound #1 Pre-procedure diagnosis of Wound #1 is a Trauma, Other located on the Right,Medial Lower Leg . There was a Excisional Skin/Subcutaneous Tissue Debridement with a total area of 1.3 sq cm performed by STONE III, Dynver Clemson E., PA-C. With the following instrument(s): Curette Material removed includes Subcutaneous Tissue, Slough, and Hyper-granulation after achieving pain  control using Lidocaine. A time out was conducted at 15:16, prior to the start of the procedure. A Moderate amount of bleeding was controlled with Pressure. The procedure was tolerated well with a pain level of  0 throughout and a pain level of 0 following the procedure. Post Debridement Measurements: 1cm length x 1.3cm width x 0.4cm depth; 0.408cm^3 volume. Character of Wound/Ulcer Post Debridement is improved. Post procedure Diagnosis Wound #1: Same as Pre-Procedure Plan Wound Cleansing: Wound #1 Right,Medial Lower Leg: Clean wound with Normal Saline. Dial antibacterial soap, wash wounds, rinse and pat dry prior to dressing wounds Anesthetic (add to Medication List): Wound #1 Right,Medial Lower Leg: Topical Lidocaine 4% cream applied to wound bed prior to debridement (In Clinic Only). Primary Wound Dressing: Wound #1 Right,Medial Lower Leg: Hydrafera Blue Ready Transfer Secondary Dressing: Wound #1 Right,Medial Lower Leg: ABD and Kerlix/Conform Dressing Change Frequency: Wound #1 Right,Medial Lower Leg: Three times weekly Follow-up Appointments: Wound #1 Right,Medial Lower Leg: Return Appointment in 1 week. Edema Control: Wound #1 Right,Medial Lower Leg: Other: - Tubi-grip F wear daily for swelling Additional Orders / Instructions: Wound #1 Right,Medial Lower Leg: Increase protein intake. 1. I would recommend at this point that we go ahead and have her continue with changing this dressing really every other day and I am going to recommend switching to [facility] as I think that would be a better option. 2. Muscular recommend that we continue with ABD pad and Tubigrip which is helping with some of the edema and swelling as well. 3. I also recommend she elevate her leg as much as possible to keep edema under good control. JANASHIA, PARCO ([id number]) We will see patient back for reevaluation in 1 week here in the clinic. If anything worsens or changes patient will contact  our office for additional recommendations. Electronic Signature(s) Signed: 07/28/2020 3:34:33 PM By: Worthy Keeler PA-C Entered By: Worthy Keeler on 07/28/2020 15:34:32 Bottoms, Loran Senters ([id number]) -------------------------------------------------------------------------------- SuperBill Details Patient Name: [name]. Date of Service: 07/28/2020 Medical Record Number: [id number] Patient Account Number: [id number] Date of Birth/Sex: 07/06/1951 (68 y.o. F) Treating RN: Cornell Barman Primary Care Provider: Otilio Miu Other Clinician: Referring Provider: Otilio Miu Treating Provider/Extender: Melburn Hake, Lovelyn Sheeran Weeks in Treatment: 5 Diagnosis Coding ICD-10 Codes Code Description [phone] Unspecified open wound, right lower leg, initial encounter L97.812 Non-pressure chronic ulcer of other part of right lower leg with fat layer exposed I87.2 Venous insufficiency (chronic) (peripheral) I73.89 Other specified peripheral vascular diseases Facility Procedures CPT4 Code: [id number] Description: 00511 - DEB SUBQ TISSUE 20 SQ CM/< Modifier: Quantity: 1 CPT4 Code: Description: ICD-10 Diagnosis Description M21.117 Non-pressure chronic ulcer of other part of right lower leg with fat lay Modifier: er exposed Quantity: Physician Procedures CPT4 Code: [id number] Description: 11042 - WC PHYS SUBQ TISS 20 SQ CM Modifier: Quantity: 1 CPT4 Code: Description: ICD-10 Diagnosis Description D03.013 Non-pressure chronic ulcer of other part of right lower leg with fat lay Modifier: er exposed Quantity: Electronic Signature(s) Signed: 07/28/2020 3:34:56 PM By: Worthy Keeler PA-C Entered By: Worthy Keeler on 07/28/2020 15:34:55

## 2020-07-29 NOTE — Progress Notes (Signed)
ALVETTA, HIDROGO ([id number]) Visit Report for 07/28/2020 Arrival Information Details Patient Name: [name], [name]. Date of Service: 07/28/2020 2:45 PM Medical Record Number: [id number] Patient Account Number: [id number] Date of Birth/Sex: 03-08-1951 (68 y.o. F) Treating RN: Cornell Barman Primary Care Xochilth Standish: Otilio Miu Other Clinician: Referring Rudolph Dobler: Otilio Miu Treating Oren Barella/Extender: Melburn Hake, HOYT Weeks in Treatment: 5 Visit Information History Since Last Visit All ordered tests and consults were completed: No Patient Arrived: Ambulatory Added or deleted any medications: No Arrival Time: 14:58 Any new allergies or adverse reactions: No Accompanied By: self Had a fall or experienced change in No Transfer Assistance: None activities of daily living that may affect Patient Identification Verified: Yes risk of falls: Secondary Verification Process Completed: Yes Signs or symptoms of abuse/neglect since last visito No Patient Requires Transmission-Based No Hospitalized since last visit: No Precautions: Implantable device outside of the clinic excluding No Patient Has Alerts: Yes cellular tissue based products placed in the center Patient Alerts: Patient on Blood since last visit: Thinner Has Dressing in Place as Prescribed: Yes Warfarin Pain Present Now: No Electronic Signature(s) Signed: 07/28/2020 4:26:10 PM By: Darci Needle Entered By: Darci Needle on 07/28/2020 14:59:01 [name] ([id number]) -------------------------------------------------------------------------------- Encounter Discharge Information Details Patient Name: [name]. Date of Service: 07/28/2020 2:45 PM Medical Record Number: [id number] Patient Account Number: [id number] Date of Birth/Sex: 02/10/1951 (68 y.o. F) Treating RN: Grover Canavan Primary Care Nyomie Ehrlich: Otilio Miu Other Clinician: Referring Caelynn Marshman: Otilio Miu Treating  Eoghan Belcher/Extender: Melburn Hake, HOYT Weeks in Treatment: 5 Encounter Discharge Information Items Post Procedure Vitals Discharge Condition: Stable Temperature (F): 98.3 Ambulatory Status: Ambulatory Pulse (bpm): 71 Discharge Destination: Home Respiratory Rate (breaths/min): 18 Transportation: Private Auto Blood Pressure (mmHg): 134/82 Accompanied By: self Schedule Follow-up Appointment: Yes Clinical Summary of Care: Electronic Signature(s) Signed: 07/28/2020 4:54:16 PM By: Grover Canavan Entered By: Grover Canavan on 07/28/2020 15:27:42 [name] ([id number]) -------------------------------------------------------------------------------- Lower Extremity Assessment Details Patient Name: [name]. Date of Service: 07/28/2020 2:45 PM Medical Record Number: [id number] Patient Account Number: [id number] Date of Birth/Sex: 1951-05-23 (68 y.o. F) Treating RN: Cornell Barman Primary Care Danylle Ouk: Otilio Miu Other Clinician: Referring Anuradha Chabot: Otilio Miu Treating Niyam Bisping/Extender: STONE III, HOYT Weeks in Treatment: 5 Edema Assessment Assessed: [Left: Yes] [Right: Yes] Edema: [Left: No] [Right: No] Calf Left: Right: Point of Measurement: 30 cm From Medial Instep 30 cm 31 cm Ankle Left: Right: Point of Measurement: 12 cm From Medial Instep 22 cm 22.5 cm Vascular Assessment Pulses: Dorsalis Pedis Palpable: [Left:Yes] [Right:Yes] Posterior Tibial Palpable: [Left:Yes] [Right:Yes] Electronic Signature(s) Signed: 07/28/2020 4:26:10 PM By: Darci Needle Signed: 07/28/2020 5:40:22 PM By: Gretta Cool, BSN, RN, CWS, Kim RN, BSN Entered By: Darci Needle on 07/28/2020 15:05:10 [name] ([id number]) -------------------------------------------------------------------------------- Multi Wound Chart Details Patient Name: [name]. Date of Service: 07/28/2020 2:45 PM Medical Record Number: [id number] Patient Account Number: [id number] Date of  Birth/Sex: 1951/08/09 (68 y.o. F) Treating RN: Grover Canavan Primary Care Morris Longenecker: Otilio Miu Other Clinician: Referring Jovita Persing: Otilio Miu Treating Elany Felix/Extender: STONE III, HOYT Weeks in Treatment: 5 Vital Signs Height(in): 68 Pulse(bpm): 71 Weight(lbs): 184 Blood Pressure(mmHg): 134/82 Body Mass Index(BMI): 28 Temperature(F): 98.3 Respiratory Rate(breaths/min): 18 Photos: [N/A:N/A] Wound Location: Right, Medial Lower Leg N/A N/A Wounding Event: Trauma N/A N/A Primary Etiology: Trauma, Other N/A N/A Comorbid History: Osteoarthritis N/A N/A Date Acquired: 04/02/2020 N/A N/A Weeks of Treatment: 5 N/A N/A Wound Status: Open N/A N/A Measurements L x W x D (cm) 1x1.3x0.2 N/A N/A Area (cm) :  1.021 N/A N/A Volume (cm) : 0.204 N/A N/A % Reduction in Area: -18.20% N/A N/A % Reduction in Volume: -137.20% N/A N/A Classification: Full Thickness Without Exposed N/A N/A Support Structures Exudate Amount: Medium N/A N/A Exudate Type: Serosanguineous N/A N/A Exudate Color: red, brown N/A N/A Wound Margin: Flat and Intact N/A N/A Granulation Amount: Medium (34-66%) N/A N/A Granulation Quality: Pink N/A N/A Necrotic Amount: Medium (34-66%) N/A N/A Exposed Structures: Fat Layer (Subcutaneous Tissue): N/A N/A Yes Epithelialization: None N/A N/A Treatment Notes Electronic Signature(s) Signed: 07/28/2020 4:54:16 PM By: Grover Canavan Entered By: Grover Canavan on 07/28/2020 15:15:40 [name] ([id number]) -------------------------------------------------------------------------------- [place] Details Patient Name: [name]. Date of Service: 07/28/2020 2:45 PM Medical Record Number: [id number] Patient Account Number: [id number] Date of Birth/Sex: 1951/09/26 (68 y.o. F) Treating RN: Grover Canavan Primary Care Mirko Tailor: Otilio Miu Other Clinician: Referring Eulis Salazar: Otilio Miu Treating Mckena Chern/Extender: Melburn Hake,  HOYT Weeks in Treatment: 5 Active Inactive Necrotic Tissue Nursing Diagnoses: Impaired tissue integrity related to necrotic/devitalized tissue Goals: Necrotic/devitalized tissue will be minimized in the wound bed Date Initiated: 06/20/2020 Target Resolution Date: 06/27/2020 Goal Status: Active Interventions: Assess patient pain level pre-, during and post procedure and prior to discharge Treatment Activities: Apply topical anesthetic as ordered : 06/20/2020 Excisional debridement : 06/20/2020 Notes: Orientation to the Wound Care Program Nursing Diagnoses: Knowledge deficit related to the wound healing center program Goals: Patient/caregiver will verbalize understanding of the [facility] Date Initiated: 06/20/2020 Target Resolution Date: 06/27/2020 Goal Status: Active Interventions: Provide education on orientation to the wound center Notes: Wound/Skin Impairment Nursing Diagnoses: Impaired tissue integrity Goals: Ulcer/skin breakdown will have a volume reduction of 30% by week 4 Date Initiated: 06/20/2020 Target Resolution Date: 07/21/2020 Goal Status: Active Interventions: Assess patient/caregiver ability to obtain necessary supplies Assess ulceration(s) every visit Treatment Activities: Skin care regimen initiated : 06/20/2020 Notes: Electronic Signature(s) JOVONDA, SELNER ([id number]) Signed: 07/28/2020 4:54:16 PM By: Grover Canavan Entered By: Grover Canavan on 07/28/2020 15:15:32 [name] ([id number]) -------------------------------------------------------------------------------- Pain Assessment Details Patient Name: [name]. Date of Service: 07/28/2020 2:45 PM Medical Record Number: [id number] Patient Account Number: [id number] Date of Birth/Sex: 02-08-1951 (68 y.o. F) Treating RN: Cornell Barman Primary Care Joseh Sjogren: Otilio Miu Other Clinician: Referring Jamall Strohmeier: Otilio Miu Treating Karman Biswell/Extender: Melburn Hake,  HOYT Weeks in Treatment: 5 Active Problems Location of Pain Severity and Description of Pain Patient Has Paino No Site Locations With Dressing Change: No Pain Management and Medication Current Pain Management: Electronic Signature(s) Signed: 07/28/2020 4:26:10 PM By: Darci Needle Signed: 07/28/2020 5:40:22 PM By: Gretta Cool, BSN, RN, CWS, Kim RN, BSN Entered By: Darci Needle on 07/28/2020 14:59:43 [name] ([id number]) -------------------------------------------------------------------------------- Patient/Caregiver Education Details Patient Name: BREONA, CHERUBIN. Date of Service: 07/28/2020 2:45 PM Medical Record Number: [id number] Patient Account Number: [id number] Date of Birth/Gender: [birth year] (68 y.o. F) Treating RN: Grover Canavan Primary Care Physician: Otilio Miu Other Clinician: Referring Physician: Otilio Miu Treating Physician/Extender: Melburn Hake, HOYT Weeks in Treatment: 5 Education Assessment Education Provided To: Patient Education Topics Provided Wound/Skin Impairment: Handouts: Caring for Your Ulcer Methods: Explain/Verbal Responses: State content correctly Electronic Signature(s) Signed: 07/28/2020 4:54:16 PM By: Grover Canavan Entered By: Grover Canavan on 07/28/2020 15:24:26 [name] ([id number]) -------------------------------------------------------------------------------- Wound Assessment Details Patient Name: [name]. Date of Service: 07/28/2020 2:45 PM Medical Record Number: [id number] Patient Account Number: [id number] Date of Birth/Sex: [birth year] (68 y.o. F) Treating RN: Cornell Barman Primary Care Dshawn Mcnay: Otilio Miu Other Clinician: Referring Meeka Cartelli:  Otilio Miu Treating Litzi Binning/Extender: STONE III, HOYT Weeks in Treatment: 5 Wound Status Wound Number: 1 Primary Etiology: Trauma, Other Wound Location: Right, Medial Lower Leg Wound Status: Open Wounding Event: Trauma Comorbid  History: Osteoarthritis Date Acquired: 04/02/2020 Weeks Of Treatment: 5 Clustered Wound: No Photos Wound Measurements Length: (cm) 1 Width: (cm) 1.3 Depth: (cm) 0.2 Area: (cm) 1.021 Volume: (cm) 0.204 % Reduction in Area: -18.2% % Reduction in Volume: -137.2% Epithelialization: None Wound Description Classification: Full Thickness Without Exposed Support Structu Wound Margin: Flat and Intact Exudate Amount: Medium Exudate Type: Serosanguineous Exudate Color: red, brown res Foul Odor After Cleansing: No Slough/Fibrino Yes Wound Bed Granulation Amount: Medium (34-66%) Exposed Structure Granulation Quality: Pink Fat Layer (Subcutaneous Tissue) Exposed: Yes Necrotic Amount: Medium (34-66%) Necrotic Quality: Adherent Slough Treatment Notes Wound #1 (Right, Medial Lower Leg) Notes Hblue, abd, conform, Tubi-grip F Electronic Signature(s) Signed: 07/28/2020 4:26:10 PM By: Darci Needle Signed: 07/28/2020 5:40:22 PM By: Gretta Cool, BSN, RN, CWS, Kim RN, BSN Entered By: Darci Needle on 07/28/2020 15:02:07 ROMONDA, PARKER ([id number]) SACHI, BOULAY ([id number]) -------------------------------------------------------------------------------- Vitals Details Patient Name: RYLEI, MASELLA. Date of Service: 07/28/2020 2:45 PM Medical Record Number: [id number] Patient Account Number: [id number] Date of Birth/Sex: 1951/08/27 (68 y.o. F) Treating RN: Cornell Barman Primary Care Claudio Mondry: Otilio Miu Other Clinician: Referring Rayna Brenner: Otilio Miu Treating Tyren Dugar/Extender: STONE III, HOYT Weeks in Treatment: 5 Vital Signs Time Taken: 02:45 Temperature (F): 98.3 Height (in): 68 Pulse (bpm): 71 Weight (lbs): 184 Respiratory Rate (breaths/min): 18 Body Mass Index (BMI): 28 Blood Pressure (mmHg): 134/82 Reference Range: 80 - 120 mg / dl Electronic Signature(s) Signed: 07/28/2020 4:26:10 PM By: Darci Needle Entered By: Darci Needle on 07/28/2020 14:59:33

## 2020-08-03 ENCOUNTER — Other Ambulatory Visit: Payer: Self-pay | Admitting: Family Medicine

## 2020-08-03 DIAGNOSIS — E7801 Familial hypercholesterolemia: Secondary | ICD-10-CM

## 2020-08-04 ENCOUNTER — Other Ambulatory Visit: Payer: Self-pay

## 2020-08-04 ENCOUNTER — Encounter: Payer: Medicare Other | Attending: Physician Assistant | Admitting: Physician Assistant

## 2020-08-04 DIAGNOSIS — L97812 Non-pressure chronic ulcer of other part of right lower leg with fat layer exposed: Secondary | ICD-10-CM | POA: Insufficient documentation

## 2020-08-04 DIAGNOSIS — I872 Venous insufficiency (chronic) (peripheral): Secondary | ICD-10-CM | POA: Insufficient documentation

## 2020-08-04 DIAGNOSIS — Z86718 Personal history of other venous thrombosis and embolism: Secondary | ICD-10-CM | POA: Diagnosis not present

## 2020-08-04 DIAGNOSIS — I7389 Other specified peripheral vascular diseases: Secondary | ICD-10-CM | POA: Diagnosis not present

## 2020-08-04 NOTE — Progress Notes (Addendum)
BUENA, BOEHM ([id number]) Visit Report for 08/04/2020 Arrival Information Details Patient Name: [name], [name]. Date of Service: 08/04/2020 3:30 PM Medical Record Number: [id number] Patient Account Number: [id number] Date of Birth/Sex: May 10, 1951 (68 y.o. F) Treating RN: Cornell Barman Primary Care Antoinne Spadaccini: Otilio Miu Other Clinician: Referring Madalyn Legner: Otilio Miu Treating Beyonka Pitney/Extender: Melburn Hake, HOYT Weeks in Treatment: 6 Visit Information History Since Last Visit All ordered tests and consults were completed: No Patient Arrived: Ambulatory Added or deleted any medications: No Arrival Time: 16:02 Any new allergies or adverse reactions: No Accompanied By: self Had a fall or experienced change in No Transfer Assistance: None activities of daily living that may affect Patient Identification Verified: Yes risk of falls: Secondary Verification Process Completed: Yes Signs or symptoms of abuse/neglect since last visito No Patient Requires Transmission-Based No Hospitalized since last visit: No Precautions: Implantable device outside of the clinic excluding No Patient Has Alerts: Yes cellular tissue based products placed in the center Patient Alerts: Patient on Blood since last visit: Thinner Has Dressing in Place as Prescribed: Yes Warfarin Pain Present Now: No Electronic Signature(s) Signed: 08/04/2020 4:36:13 PM By: Darci Needle Entered By: Darci Needle on 08/04/2020 16:02:35 [name] ([id number]) -------------------------------------------------------------------------------- Clinic Level of Care Assessment Details Patient Name: [name]. Date of Service: 08/04/2020 3:30 PM Medical Record Number: [id number] Patient Account Number: [id number] Date of Birth/Sex: [birth year] (68 y.o. F) Treating RN: Grover Canavan Primary Care Tasfia Vasseur: Otilio Miu Other Clinician: Referring Myrtha Tonkovich: Otilio Miu Treating Bishoy Cupp/Extender:  Melburn Hake, HOYT Weeks in Treatment: 6 Clinic Level of Care Assessment Items TOOL 4 Quantity Score []  - Use when only an EandM is performed on FOLLOW-UP visit 0 ASSESSMENTS - Nursing Assessment / Reassessment X - Reassessment of Co-morbidities (includes updates in patient status) 1 10 X- 1 5 Reassessment of Adherence to Treatment Plan ASSESSMENTS - Wound and Skin Assessment / Reassessment X - Simple Wound Assessment / Reassessment - one wound 1 5 []  - 0 Complex Wound Assessment / Reassessment - multiple wounds []  - 0 Dermatologic / Skin Assessment (not related to wound area) ASSESSMENTS - Focused Assessment X - Circumferential Edema Measurements - multi extremities 1 5 []  - 0 Nutritional Assessment / Counseling / Intervention []  - 0 Lower Extremity Assessment (monofilament, tuning fork, pulses) []  - 0 Peripheral Arterial Disease Assessment (using hand held doppler) ASSESSMENTS - Ostomy and/or Continence Assessment and Care []  - Incontinence Assessment and Management 0 []  - 0 Ostomy Care Assessment and Management (repouching, etc.) PROCESS - Coordination of Care X - Simple Patient / Family Education for ongoing care 1 15 []  - 0 Complex (extensive) Patient / Family Education for ongoing care []  - 0 Staff obtains Programmer, systems, Records, Test Results / Process Orders []  - 0 Staff telephones HHA, Nursing Homes / Clarify orders / etc []  - 0 Routine Transfer to another Facility (non-emergent condition) []  - 0 Routine Hospital Admission (non-emergent condition) []  - 0 New Admissions / Biomedical engineer / Ordering NPWT, Apligraf, etc. []  - 0 Emergency Hospital Admission (emergent condition) X- 1 10 Simple Discharge Coordination []  - 0 Complex (extensive) Discharge Coordination PROCESS - Special Needs []  - Pediatric / Minor Patient Management 0 []  - 0 Isolation Patient Management []  - 0 Hearing / Language / Visual special needs []  - 0 Assessment of Community assistance  (transportation, D/C planning, etc.) []  - 0 Additional assistance / Altered mentation []  - 0 Support Surface(s) Assessment (bed, cushion, seat, etc.) INTERVENTIONS - Wound Cleansing / Measurement Mcnutt, Boody (  [id number]) X- 1 5 Simple Wound Cleansing - one wound []  - 0 Complex Wound Cleansing - multiple wounds []  - 0 Wound Imaging (photographs - any number of wounds) X- 1 5 Wound Tracing (instead of photographs) X- 1 5 Simple Wound Measurement - one wound []  - 0 Complex Wound Measurement - multiple wounds INTERVENTIONS - Wound Dressings X - Small Wound Dressing one or multiple wounds 1 10 []  - 0 Medium Wound Dressing one or multiple wounds []  - 0 Large Wound Dressing one or multiple wounds []  - 0 Application of Medications - topical []  - 0 Application of Medications - injection INTERVENTIONS - Miscellaneous []  - External ear exam 0 []  - 0 Specimen Collection (cultures, biopsies, blood, body fluids, etc.) []  - 0 Specimen(s) / Culture(s) sent or taken to Lab for analysis []  - 0 Patient Transfer (multiple staff / Civil Service fast streamer / Similar devices) []  - 0 Simple Staple / Suture removal (25 or less) []  - 0 Complex Staple / Suture removal (26 or more) []  - 0 Hypo / Hyperglycemic Management (close monitor of Blood Glucose) []  - 0 Ankle / Brachial Index (ABI) - do not check if billed separately X- 1 5 Vital Signs Has the patient been seen at the hospital within the last three years: Yes Total Score: 80 Level Of Care: New/Established - Level 3 Electronic Signature(s) Signed: 08/04/2020 4:51:51 PM By: Grover Canavan Entered By: Grover Canavan on 08/04/2020 16:14:09 [name] ([id number]) -------------------------------------------------------------------------------- Encounter Discharge Information Details Patient Name: [name]. Date of Service: 08/04/2020 3:30 PM Medical Record Number: [id number] Patient Account Number: [id number] Date of  Birth/Sex: 03/19/1951 (68 y.o. F) Treating RN: Grover Canavan Primary Care Shigeru Lampert: Otilio Miu Other Clinician: Referring Kingsten Enfield: Otilio Miu Treating Damaya Channing/Extender: Melburn Hake, HOYT Weeks in Treatment: 6 Encounter Discharge Information Items Discharge Condition: Stable Ambulatory Status: Ambulatory Discharge Destination: Home Transportation: Private Auto Accompanied By: self Schedule Follow-up Appointment: Yes Clinical Summary of Care: Electronic Signature(s) Signed: 08/04/2020 4:51:51 PM By: Grover Canavan Entered By: Grover Canavan on 08/04/2020 16:14:45 [name] ([id number]) -------------------------------------------------------------------------------- Lower Extremity Assessment Details Patient Name: [name]. Date of Service: 08/04/2020 3:30 PM Medical Record Number: [id number] Patient Account Number: [id number] Date of Birth/Sex: [birth year] (68 y.o. F) Treating RN: Cornell Barman Primary Care Rona Tomson: Otilio Miu Other Clinician: Referring Shown Dissinger: Otilio Miu Treating Bralyn Folkert/Extender: STONE III, HOYT Weeks in Treatment: 6 Edema Assessment Assessed: [Left: No] [Right: Yes] Edema: [Left: Ye] [Right: s] Calf Left: Right: Point of Measurement: 30 cm From Medial Instep cm 29 cm Ankle Left: Right: Point of Measurement: 12 cm From Medial Instep cm 19 cm Vascular Assessment Pulses: Dorsalis Pedis Palpable: [Right:Yes] Posterior Tibial Palpable: [Right:Yes] Electronic Signature(s) Signed: 08/04/2020 4:36:13 PM By: Darci Needle Signed: 08/04/2020 6:11:40 PM By: Gretta Cool, BSN, RN, CWS, Kim RN, BSN Entered By: Darci Needle on 08/04/2020 16:05:45 [name] ([id number]) -------------------------------------------------------------------------------- Multi Wound Chart Details Patient Name: [name]. Date of Service: 08/04/2020 3:30 PM Medical Record Number: [id number] Patient Account Number: [id number] Date of  Birth/Sex: 1951/10/09 (68 y.o. F) Treating RN: Grover Canavan Primary Care Kamica Florance: Otilio Miu Other Clinician: Referring Mahitha Hickling: Otilio Miu Treating Nga Rabon/Extender: STONE III, HOYT Weeks in Treatment: 6 Vital Signs Height(in): 68 Pulse(bpm): 71 Weight(lbs): 184 Blood Pressure(mmHg): 113/78 Body Mass Index(BMI): 28 Temperature(F): 98.3 Respiratory Rate(breaths/min): 18 Photos: [N/A:N/A] Wound Location: Right, Medial Lower Leg N/A N/A Wounding Event: Trauma N/A N/A Primary Etiology: Trauma, Other N/A N/A Comorbid History: Osteoarthritis N/A N/A Date Acquired: 04/02/2020 N/A N/A [name]  of Treatment: 6 N/A N/A Wound Status: Open N/A N/A Measurements L x W x D (cm) 1x1x0.2 N/A N/A Area (cm) : 0.785 N/A N/A Volume (cm) : 0.157 N/A N/A % Reduction in Area: 9.10% N/A N/A % Reduction in Volume: -82.60% N/A N/A Classification: Full Thickness Without Exposed N/A N/A Support Structures Exudate Amount: Medium N/A N/A Exudate Type: Serosanguineous N/A N/A Exudate Color: red, brown N/A N/A Wound Margin: Flat and Intact N/A N/A Granulation Amount: Medium (34-66%) N/A N/A Granulation Quality: Pink N/A N/A Necrotic Amount: Medium (34-66%) N/A N/A Exposed Structures: Fat Layer (Subcutaneous Tissue): N/A N/A Yes Epithelialization: None N/A N/A Treatment Notes Electronic Signature(s) Signed: 08/04/2020 4:51:51 PM By: Grover Canavan Entered By: Grover Canavan on 08/04/2020 16:11:28 [name] ([id number]) -------------------------------------------------------------------------------- [place] Details Patient Name: [name]. Date of Service: 08/04/2020 3:30 PM Medical Record Number: [id number] Patient Account Number: [id number] Date of Birth/Sex: 1951-02-02 (68 y.o. F) Treating RN: Grover Canavan Primary Care Lucretia Pendley: Otilio Miu Other Clinician: Referring Jesus Poplin: Otilio Miu Treating Atzin Buchta/Extender: Melburn Hake,  HOYT Weeks in Treatment: 6 Active Inactive Necrotic Tissue Nursing Diagnoses: Impaired tissue integrity related to necrotic/devitalized tissue Goals: Necrotic/devitalized tissue will be minimized in the wound bed Date Initiated: 06/20/2020 Target Resolution Date: 06/27/2020 Goal Status: Active Interventions: Assess patient pain level pre-, during and post procedure and prior to discharge Treatment Activities: Apply topical anesthetic as ordered : 06/20/2020 Excisional debridement : 06/20/2020 Notes: Orientation to the Wound Care Program Nursing Diagnoses: Knowledge deficit related to the wound healing center program Goals: Patient/caregiver will verbalize understanding of the [place] Date Initiated: 06/20/2020 Target Resolution Date: 06/27/2020 Goal Status: Active Interventions: Provide education on orientation to the wound center Notes: Wound/Skin Impairment Nursing Diagnoses: Impaired tissue integrity Goals: Ulcer/skin breakdown will have a volume reduction of 30% by week 4 Date Initiated: 06/20/2020 Target Resolution Date: 07/21/2020 Goal Status: Active Interventions: Assess patient/caregiver ability to obtain necessary supplies Assess ulceration(s) every visit Treatment Activities: Skin care regimen initiated : 06/20/2020 Notes: Electronic Signature(s) [name], [name] ([id number]) Signed: 08/04/2020 4:51:51 PM By: Grover Canavan Entered By: Grover Canavan on 08/04/2020 16:11:16 [name] ([id number]) -------------------------------------------------------------------------------- Pain Assessment Details Patient Name: [name]. Date of Service: 08/04/2020 3:30 PM Medical Record Number: [id number] Patient Account Number: [id number] Date of Birth/Sex: Feb 21, 1951 (68 y.o. F) Treating RN: Cornell Barman Primary Care Vern Prestia: Otilio Miu Other Clinician: Referring Aileene Lanum: Otilio Miu Treating Keeyon Privitera/Extender: Melburn Hake,  HOYT Weeks in Treatment: 6 Active Problems Location of Pain Severity and Description of Pain Patient Has Paino No Site Locations With Dressing Change: No Pain Management and Medication Current Pain Management: Electronic Signature(s) Signed: 08/04/2020 4:36:13 PM By: Darci Needle Signed: 08/04/2020 6:11:40 PM By: Gretta Cool, BSN, RN, CWS, Kim RN, BSN Entered By: Darci Needle on 08/04/2020 16:03:11 [name] ([id number]) -------------------------------------------------------------------------------- Patient/Caregiver Education Details Patient Name: [name], [name]. Date of Service: 08/04/2020 3:30 PM Medical Record Number: [id number] Patient Account Number: [id number] Date of Birth/Gender: [birth year] (68 y.o. F) Treating RN: Grover Canavan Primary Care Physician: Otilio Miu Other Clinician: Referring Physician: Otilio Miu Treating Physician/Extender: Melburn Hake, HOYT Weeks in Treatment: 6 Education Assessment Education Provided To: Patient Education Topics Provided Wound/Skin Impairment: Handouts: Caring for Your Ulcer Methods: Explain/Verbal Responses: State content correctly Electronic Signature(s) Signed: 08/04/2020 4:51:51 PM By: Grover Canavan Entered By: Grover Canavan on 08/04/2020 16:11:46 [name] ([id number]) -------------------------------------------------------------------------------- Wound Assessment Details Patient Name: [name]. Date of Service: 08/04/2020 3:30 PM Medical Record Number: [id number] Patient  Account Number: [id number] Date of Birth/Sex: 21-Mar-1951 (68 y.o. F) Treating RN: Cornell Barman Primary Care Satcha Storlie: Otilio Miu Other Clinician: Referring Jair Lindblad: Otilio Miu Treating Kimanh Templeman/Extender: STONE III, HOYT Weeks in Treatment: 6 Wound Status Wound Number: 1 Primary Etiology: Trauma, Other Wound Location: Right, Medial Lower Leg Wound Status: Open Wounding Event: Trauma Comorbid History:  Osteoarthritis Date Acquired: 04/02/2020 Weeks Of Treatment: 6 Clustered Wound: No Photos Wound Measurements Length: (cm) 1 Width: (cm) 1 Depth: (cm) 0.2 Area: (cm) 0.785 Volume: (cm) 0.157 % Reduction in Area: 9.1% % Reduction in Volume: -82.6% Epithelialization: None Wound Description Classification: Full Thickness Without Exposed Support Structu Wound Margin: Flat and Intact Exudate Amount: Medium Exudate Type: Serosanguineous Exudate Color: red, brown res Foul Odor After Cleansing: No Slough/Fibrino Yes Wound Bed Granulation Amount: Medium (34-66%) Exposed Structure Granulation Quality: Pink Fat Layer (Subcutaneous Tissue) Exposed: Yes Necrotic Amount: Medium (34-66%) Necrotic Quality: Adherent Therapist, music) Signed: 08/04/2020 4:36:13 PM By: Darci Needle Signed: 08/04/2020 6:11:40 PM By: Gretta Cool, BSN, RN, CWS, Kim RN, BSN Entered By: Darci Needle on 08/04/2020 16:04:54 [name] ([id number]) -------------------------------------------------------------------------------- Vitals Details Patient Name: [name]. Date of Service: 08/04/2020 3:30 PM Medical Record Number: [id number] Patient Account Number: [id number] Date of Birth/Sex: 1951-10-07 (68 y.o. F) Treating RN: Cornell Barman Primary Care Nawaf Strange: Otilio Miu Other Clinician: Referring Maheen Cwikla: Otilio Miu Treating Cannan Beeck/Extender: STONE III, HOYT Weeks in Treatment: 6 Vital Signs Time Taken: 03:50 Temperature (F): 98.3 Height (in): 68 Pulse (bpm): 71 Weight (lbs): 184 Respiratory Rate (breaths/min): 18 Body Mass Index (BMI): 28 Blood Pressure (mmHg): 113/78 Reference Range: 80 - 120 mg / dl Electronic Signature(s) Signed: 08/04/2020 4:36:13 PM By: Darci Needle Entered By: Darci Needle on 08/04/2020 16:03:03

## 2020-08-04 NOTE — Progress Notes (Addendum)
[name], [name] ([id number]) Visit Report for 08/04/2020 Chief Complaint Document Details Patient Name: [name], [name]. Date of Service: 08/04/2020 3:30 PM Medical Record Number: [id number] Patient Account Number: [id number] Date of Birth/Sex: [birth year] (68 y.o. F) Treating RN: Cornell Barman Primary Care Provider: Otilio Miu Other Clinician: Referring Provider: Otilio Miu Treating Provider/Extender: Melburn Hake, Norval Slaven Weeks in Treatment: 6 Information Obtained from: Patient Chief Complaint Right LE Ulcer Electronic Signature(s) Signed: 08/04/2020 3:36:51 PM By: Worthy Keeler PA-C Entered By: Worthy Keeler on 08/04/2020 15:36:50 [name] ([id number]) -------------------------------------------------------------------------------- HPI Details Patient Name: [name]. Date of Service: 08/04/2020 3:30 PM Medical Record Number: [id number] Patient Account Number: [id number] Date of Birth/Sex: [birth year] (68 y.o. F) Treating RN: Cornell Barman Primary Care Provider: Otilio Miu Other Clinician: Referring Provider: Otilio Miu Treating Provider/Extender: Melburn Hake, Masako Overall Weeks in Treatment: 6 History of Present Illness HPI Description: 06/20/2020 upon evaluation today patient presents for initial evaluation here at clinic concerning wound on her right lower leg secondary to having struck this on a metal chair out in her yard in May 2021. She tells me that this has been managed at this point by her primary care provider Dr. Ronnald Ramp. With that being said Dr. Ronnald Ramp did make a referral to [place] to further evaluate this situation as well. The patient has been on doxycycline though she has not at this point and has been using the mupirocin as prescribed by Dr. Ronnald Ramp. She does have a history of venous insufficiency and has had some previous vascular work-up including an ablation secondary to a blood clot that she had in her leg. Subsequently she also has a slightly elevated ABI  indicating likely some hardening of the arteries in general but nonetheless she seems to have good blood flow into the foot there is no evidence of cyanosis or poor capillary refill. The patient does have pain at the site of the injury. He tells me this did not bruise but nonetheless it seems to be somewhat deep. 07/05/2020 upon evaluation today patient unfortunately does not appear to be doing as well as she was previous. There is no signs of active infection at this time which is good news I really felt like after the debridement we performed last time she would be doing much better but she really is not. She still has a lot of necrotic tissue is very tender and actually has some erythema around the wound. Overall I am concerned that things do not seem to be improving quite as effectively and as much as I would like to see. 07/14/2020 on evaluation today patient appears to still be doing somewhat poorly in regard to her leg ulcer. This does still appear to be infected. I did call in a prescription for clindamycin but the patient apparently missed a message in that regard. This is at the pharmacy for her although originally written for 2 times a day dosing I really want her to take it 3 times a day I told her that as well during conversation in the clinic. This is based on the severity of the wound and what I am seeing today. 07/21/20 upon evaluation patient appears to be doing well with regard to her wound as compared to previous. Fortunately there is no signs of active infection I do believe the clindamycin has done better for her since she started taking that. With that being said I do need to extend this for her because I did have her take it 3 times a  day and it was written for 2 times a day. She is in agreement with that plan. 07/28/2020 on evaluation today patient actually appears to be doing quite well with regard to her leg ulcer. This is making great progress in my opinion and overall very  pleased with where things stand. She does not seem to show any signs of infection right now which is also excellent. 08/04/2020 upon evaluation today patient appears to be doing well with regard to her wound. I do believe she has been tolerating the dressing changes very well without complication. Electronic Signature(s) Signed: 08/04/2020 4:29:42 PM By: Worthy Keeler PA-C Entered By: Worthy Keeler on 08/04/2020 16:29:42 [name] ([id number]) -------------------------------------------------------------------------------- Physical Exam Details Patient Name: [name], [name]. Date of Service: 08/04/2020 3:30 PM Medical Record Number: [id number] Patient Account Number: [id number] Date of Birth/Sex: 1951/10/08 (68 y.o. F) Treating RN: Cornell Barman Primary Care Provider: Otilio Miu Other Clinician: Referring Provider: Otilio Miu Treating Provider/Extender: STONE III, Jalissa Heinzelman Weeks in Treatment: 6 Constitutional Well-nourished and well-hydrated in no acute distress. Respiratory normal breathing without difficulty. Psychiatric this patient is able to make decisions and demonstrates good insight into disease process. Alert and Oriented x 3. pleasant and cooperative. Notes Patient's wound currently showed signs of good granulation at this time. There does not appear to be any signs of active infection and overall I feel like she is doing quite well. She has done with her antibiotics at this point no sharp debridement was necessary today. Electronic Signature(s) Signed: 08/04/2020 4:55:45 PM By: Worthy Keeler PA-C Entered By: Worthy Keeler on 08/04/2020 16:55:44 [name] ([id number]) -------------------------------------------------------------------------------- Physician Orders Details Patient Name: [name], [name]. Date of Service: 08/04/2020 3:30 PM Medical Record Number: [id number] Patient Account Number: [id number] Date of Birth/Sex: Jan 21, 1951 (68 y.o.  F) Treating RN: Grover Canavan Primary Care Provider: Otilio Miu Other Clinician: Referring Provider: Otilio Miu Treating Provider/Extender: Melburn Hake, Sarrah Fiorenza Weeks in Treatment: 6 Verbal / Phone Orders: No Diagnosis Coding ICD-10 Coding Code Description S81.801A Unspecified open wound, right lower leg, initial encounter L97.812 Non-pressure chronic ulcer of other part of right lower leg with fat layer exposed I87.2 Venous insufficiency (chronic) (peripheral) I73.89 Other specified peripheral vascular diseases Wound Cleansing Wound #1 Right,Medial Lower Leg o Clean wound with Normal Saline. o Dial antibacterial soap, wash wounds, rinse and pat dry prior to dressing wounds Anesthetic (add to Medication List) Wound #1 Right,Medial Lower Leg o Topical Lidocaine 4% cream applied to wound bed prior to debridement (In Clinic Only). Primary Wound Dressing Wound #1 Right,Medial Lower Leg o Hydrafera Blue Ready Transfer Secondary Dressing Wound #1 Right,Medial Lower Leg o ABD and Kerlix/Conform Dressing Change Frequency Wound #1 Right,Medial Lower Leg o Three times weekly Follow-up Appointments Wound #1 Right,Medial Lower Leg o Return Appointment in 2 weeks. Edema Control Wound #1 Right,Medial Lower Leg o Other: - Tubi-grip F wear daily for swelling Additional Orders / Instructions Wound #1 Right,Medial Lower Leg o Increase protein intake. Electronic Signature(s) Signed: 08/04/2020 4:51:51 PM By: Grover Canavan Signed: 08/04/2020 5:25:12 PM By: Worthy Keeler PA-C Entered By: Grover Canavan on 08/04/2020 16:13:24 [name] ([id number]) -------------------------------------------------------------------------------- Problem List Details Patient Name: [name], [name]. Date of Service: 08/04/2020 3:30 PM Medical Record Number: [id number] Patient Account Number: [id number] Date of Birth/Sex: 18-Jul-1951 (68 y.o. F) Treating RN: Cornell Barman Primary Care Provider: Otilio Miu Other Clinician: Referring Provider: Otilio Miu Treating Provider/Extender: STONE III, Breyana Follansbee Weeks in Treatment: 6 Active Problems ICD-10 Encounter  Code Description Active Date MDM Diagnosis S81.801A Unspecified open wound, right lower leg, initial encounter 06/20/2020 No Yes L97.812 Non-pressure chronic ulcer of other part of right lower leg with fat layer 06/20/2020 No Yes exposed I87.2 Venous insufficiency (chronic) (peripheral) 06/20/2020 No Yes I73.89 Other specified peripheral vascular diseases 06/20/2020 No Yes Inactive Problems Resolved Problems Electronic Signature(s) Signed: 08/04/2020 3:36:45 PM By: Worthy Keeler PA-C Entered By: Worthy Keeler on 08/04/2020 15:36:45 [name] ([id number]) -------------------------------------------------------------------------------- Progress Note Details Patient Name: [name]. Date of Service: 08/04/2020 3:30 PM Medical Record Number: [id number] Patient Account Number: [id number] Date of Birth/Sex: 10-03-1951 (68 y.o. F) Treating RN: Cornell Barman Primary Care Provider: Otilio Miu Other Clinician: Referring Provider: Otilio Miu Treating Provider/Extender: Melburn Hake, Arien Morine Weeks in Treatment: 6 Subjective Chief Complaint Information obtained from Patient Right LE Ulcer History of Present Illness (HPI) 06/20/2020 upon evaluation today patient presents for initial evaluation here at clinic concerning wound on her right lower leg secondary to having struck this on a metal chair out in her yard in May 2021. She tells me that this has been managed at this point by her primary care provider Dr. Ronnald Ramp. With that being said Dr. Ronnald Ramp did make a referral to [place] to further evaluate this situation as well. The patient has been on doxycycline though she has not at this point and has been using the mupirocin as prescribed by Dr. Ronnald Ramp. She does have a history of venous insufficiency  and has had some previous vascular work-up including an ablation secondary to a blood clot that she had in her leg. Subsequently she also has a slightly elevated ABI indicating likely some hardening of the arteries in general but nonetheless she seems to have good blood flow into the foot there is no evidence of cyanosis or poor capillary refill. The patient does have pain at the site of the injury. He tells me this did not bruise but nonetheless it seems to be somewhat deep. 07/05/2020 upon evaluation today patient unfortunately does not appear to be doing as well as she was previous. There is no signs of active infection at this time which is good news I really felt like after the debridement we performed last time she would be doing much better but she really is not. She still has a lot of necrotic tissue is very tender and actually has some erythema around the wound. Overall I am concerned that things do not seem to be improving quite as effectively and as much as I would like to see. 07/14/2020 on evaluation today patient appears to still be doing somewhat poorly in regard to her leg ulcer. This does still appear to be infected. I did call in a prescription for clindamycin but the patient apparently missed a message in that regard. This is at the pharmacy for her although originally written for 2 times a day dosing I really want her to take it 3 times a day I told her that as well during conversation in the clinic. This is based on the severity of the wound and what I am seeing today. 07/21/20 upon evaluation patient appears to be doing well with regard to her wound as compared to previous. Fortunately there is no signs of active infection I do believe the clindamycin has done better for her since she started taking that. With that being said I do need to extend this for her because I did have her take it 3 times a day and it was  written for 2 times a day. She is in agreement with that  plan. 07/28/2020 on evaluation today patient actually appears to be doing quite well with regard to her leg ulcer. This is making great progress in my opinion and overall very pleased with where things stand. She does not seem to show any signs of infection right now which is also excellent. 08/04/2020 upon evaluation today patient appears to be doing well with regard to her wound. I do believe she has been tolerating the dressing changes very well without complication. Objective Constitutional Well-nourished and well-hydrated in no acute distress. Vitals Time Taken: 3:50 AM, Height: 68 in, Weight: 184 lbs, BMI: 28, Temperature: 98.3 F, Pulse: 71 bpm, Respiratory Rate: 18 breaths/min, Blood Pressure: 113/78 mmHg. Respiratory normal breathing without difficulty. Psychiatric this patient is able to make decisions and demonstrates good insight into disease process. Alert and Oriented x 3. pleasant and cooperative. General Notes: Patient's wound currently showed signs of good granulation at this time. There does not appear to be any signs of active infection and overall I feel like she is doing quite well. She has done with her antibiotics at this point no sharp debridement was necessary today. Integumentary (Hair, Skin) Wound #1 status is Open. Original cause of wound was Trauma. The wound is located on the Right,Medial Lower Leg. The wound measures 1cm [name], Kathryn M. ([id number]) length x 1cm width x 0.2cm depth; 0.785cm^2 area and 0.157cm^3 volume. There is Fat Layer (Subcutaneous Tissue) exposed. There is a medium amount of serosanguineous drainage noted. The wound margin is flat and intact. There is medium (34-66%) pink granulation within the wound bed. There is a medium (34-66%) amount of necrotic tissue within the wound bed including Adherent Slough. Assessment Active Problems ICD-10 Unspecified open wound, right lower leg, initial encounter Non-pressure chronic ulcer of other part  of right lower leg with fat layer exposed Venous insufficiency (chronic) (peripheral) Other specified peripheral vascular diseases Plan Wound Cleansing: Wound #1 Right,Medial Lower Leg: Clean wound with Normal Saline. Dial antibacterial soap, wash wounds, rinse and pat dry prior to dressing wounds Anesthetic (add to Medication List): Wound #1 Right,Medial Lower Leg: Topical Lidocaine 4% cream applied to wound bed prior to debridement (In Clinic Only). Primary Wound Dressing: Wound #1 Right,Medial Lower Leg: Hydrafera Blue Ready Transfer Secondary Dressing: Wound #1 Right,Medial Lower Leg: ABD and Kerlix/Conform Dressing Change Frequency: Wound #1 Right,Medial Lower Leg: Three times weekly Follow-up Appointments: Wound #1 Right,Medial Lower Leg: Return Appointment in 2 weeks. Edema Control: Wound #1 Right,Medial Lower Leg: Other: - Tubi-grip F wear daily for swelling Additional Orders / Instructions: Wound #1 Right,Medial Lower Leg: Increase protein intake. 1. I would recommend at this point that we go ahead and continue with the wound care measures as before and the patient is in agreement with that plan will use the [facility] which has been doing a great job. 2. I am also can recommend that we use an ABD pad followed by a roll gauze and then were using Tubigrip to help with edema control. We will see patient back for reevaluation in 2 weeks here in the clinic. If anything worsens or changes patient will contact our office for additional recommendations. Electronic Signature(s) Signed: 08/04/2020 4:56:58 PM By: Worthy Keeler PA-C Entered By: Worthy Keeler on 08/04/2020 16:56:58 [name] ([id number]) -------------------------------------------------------------------------------- SuperBill Details Patient Name: [name]. Date of Service: 08/04/2020 Medical Record Number: [id number] Patient Account Number: [id number] Date of Birth/Sex: Oct 25, 1951 (68  y.o. F) Treating RN: Cornell Barman Primary Care Provider: Otilio Miu Other Clinician: Referring Provider: Otilio Miu Treating Provider/Extender: Melburn Hake, Fateh Kindle Weeks in Treatment: 6 Diagnosis Coding ICD-10 Codes Code Description [phone] Unspecified open wound, right lower leg, initial encounter L97.812 Non-pressure chronic ulcer of other part of right lower leg with fat layer exposed I87.2 Venous insufficiency (chronic) (peripheral) I73.89 Other specified peripheral vascular diseases Facility Procedures CPT4 Code: [id number] Description: 99213 - WOUND CARE VISIT-LEV 3 EST PT Modifier: Quantity: 1 Physician Procedures CPT4 Code: [id number] Description: 02725 - WC PHYS LEVEL 3 - EST PT Modifier: Quantity: 1 CPT4 Code: Description: ICD-10 Diagnosis Description S81.801A Unspecified open wound, right lower leg, initial encounter L97.812 Non-pressure chronic ulcer of other part of right lower leg with fat la I87.2 Venous insufficiency (chronic) (peripheral) I73.89 Other  specified peripheral vascular diseases Modifier: yer exposed Quantity: Electronic Signature(s) Signed: 08/04/2020 4:57:13 PM By: Worthy Keeler PA-C Entered By: Worthy Keeler on 08/04/2020 16:57:12

## 2020-08-09 ENCOUNTER — Ambulatory Visit
Admission: EM | Admit: 2020-08-09 | Discharge: 2020-08-09 | Disposition: A | Discharge status: Home / Self Care | Payer: Medicare Other | Attending: Family Medicine | Admitting: Family Medicine

## 2020-08-09 ENCOUNTER — Other Ambulatory Visit: Payer: Self-pay

## 2020-08-09 DIAGNOSIS — E039 Hypothyroidism, unspecified: Secondary | ICD-10-CM | POA: Insufficient documentation

## 2020-08-09 DIAGNOSIS — I129 Hypertensive chronic kidney disease with stage 1 through stage 4 chronic kidney disease, or unspecified chronic kidney disease: Secondary | ICD-10-CM | POA: Insufficient documentation

## 2020-08-09 DIAGNOSIS — Z7901 Long term (current) use of anticoagulants: Secondary | ICD-10-CM | POA: Diagnosis not present

## 2020-08-09 DIAGNOSIS — Z87440 Personal history of urinary (tract) infections: Secondary | ICD-10-CM | POA: Diagnosis not present

## 2020-08-09 DIAGNOSIS — R509 Fever, unspecified: Secondary | ICD-10-CM | POA: Diagnosis not present

## 2020-08-09 DIAGNOSIS — Z8673 Personal history of transient ischemic attack (TIA), and cerebral infarction without residual deficits: Secondary | ICD-10-CM | POA: Insufficient documentation

## 2020-08-09 DIAGNOSIS — Z79899 Other long term (current) drug therapy: Secondary | ICD-10-CM | POA: Insufficient documentation

## 2020-08-09 DIAGNOSIS — Z9049 Acquired absence of other specified parts of digestive tract: Secondary | ICD-10-CM | POA: Insufficient documentation

## 2020-08-09 DIAGNOSIS — Z20822 Contact with and (suspected) exposure to covid-19: Secondary | ICD-10-CM | POA: Insufficient documentation

## 2020-08-09 DIAGNOSIS — E785 Hyperlipidemia, unspecified: Secondary | ICD-10-CM | POA: Insufficient documentation

## 2020-08-09 DIAGNOSIS — N12 Tubulo-interstitial nephritis, not specified as acute or chronic: Secondary | ICD-10-CM | POA: Diagnosis not present

## 2020-08-09 DIAGNOSIS — Z79890 Hormone replacement therapy (postmenopausal): Secondary | ICD-10-CM | POA: Insufficient documentation

## 2020-08-09 DIAGNOSIS — K219 Gastro-esophageal reflux disease without esophagitis: Secondary | ICD-10-CM | POA: Diagnosis not present

## 2020-08-09 DIAGNOSIS — N183 Chronic kidney disease, stage 3 unspecified: Secondary | ICD-10-CM | POA: Diagnosis not present

## 2020-08-09 DIAGNOSIS — Z880 Allergy status to penicillin: Secondary | ICD-10-CM | POA: Insufficient documentation

## 2020-08-09 DIAGNOSIS — R350 Frequency of micturition: Secondary | ICD-10-CM | POA: Diagnosis present

## 2020-08-09 LAB — URINALYSIS, COMPLETE (UACMP) WITH MICROSCOPIC
Bilirubin Urine: NEGATIVE
Glucose, UA: NEGATIVE mg/dL
Ketones, ur: NEGATIVE mg/dL
Nitrite: POSITIVE — AB
Specific Gravity, Urine: 1.015 (ref 1.005–1.030)
WBC, UA: >50 WBC/hpf (ref 0–5)
pH: 5.5 (ref 5.0–8.0)

## 2020-08-09 LAB — SARS CORONAVIRUS 2 (TAT 6-24 HRS): SARS Coronavirus 2: NEGATIVE

## 2020-08-09 MED ORDER — CEFDINIR 300 MG PO CAPS: 300.0000 mg | ORAL_CAPSULE | Freq: Two times a day (BID) | ORAL | 0 refills | Status: AC

## 2020-08-09 NOTE — ED Triage Notes (Signed)
Patient complains of fever that started on Saturday, back pain and urinary frequency. States that urinary frequency has stopped but she now has body aches. Patient has covid vaccines. Reports that fever was as high as 104 and has continued to feel terrible.

## 2020-08-09 NOTE — Discharge Instructions (Addendum)
You were seen at the Urgent Care for fever and urinary frequency. Please pick up your prescriptions at your pharmacy. Be sure to complete your antibiotics. Follow up with your PCP as needed.   If you haven't already, sign up for My Chart to have easy access to your labs results, and communication with your primary care physician.  Dr. Susa Simmonds

## 2020-08-09 NOTE — ED Provider Notes (Signed)
[facility]    CSN: [id number] Arrival date & time: 08/09/20  1052      History   Chief Complaint Chief Complaint  Patient presents with  . Urinary Frequency    HPI [name] is a 69 y.o. female.   HPI   Patient presents with complaint of urinary frequency, urinary retention, fever and urinary incontinence. She has had occasional past episodes of UTI. Onset of symptoms was 2-3 days ago and has been unchanged since that time. The patient denies been seen by another provider for this illness. Care prior to arrival consisted of acetaminophen. Fever 104F max on Sunday that resolved by Monday. States she has been thirsty and drinking a lot of water.  Uses incontinuiecne pads and takes Vesicare.     Past Medical History:  Diagnosis Date  . Arthritis 04/02/1996  . Depression   . GERD (gastroesophageal reflux disease) 04/03/2019  . Hyperlipidemia   . Hypertension   . Renal insufficiency   . Stroke ([place])   . Thyroid disease     Patient Active Problem List   Diagnosis Date Noted  . Antiphospholipid antibody syndrome ([place]) 02/10/2020  . Acute kidney failure ([place]) 11/04/2019  . Edema of lower extremity 11/04/2019  . Stage 3 chronic kidney disease 11/04/2019  . Popliteal bursitis of left knee 08/25/2019  . Familial hypercholesterolemia 08/25/2019  . Renal insufficiency 08/25/2019  . Low hemoglobin 08/25/2019  . History of essential hypertension 08/25/2019  . Seasonal allergic rhinitis due to pollen 02/24/2019  . Age-related osteoporosis without current pathological fracture 01/20/2018  . Osteoporosis 11/12/2017  . Urinary incontinence 05/28/2017  . Knee pain 05/08/2017  . Muscle weakness 05/08/2017  . Sprain of MCL (medial collateral ligament) of knee 05/08/2017  . Adult hypothyroidism 10/23/2016  . Essential hypertension 10/23/2016  . Gastroesophageal reflux disease without esophagitis 10/23/2016  . Recurrent major depressive disorder, in full  remission ([place]) 10/23/2016  . Anticoagulant long-term use 10/23/2016    Past Surgical History:  Procedure Laterality Date  . CESAREAN SECTION    . CHOLECYSTECTOMY    . COLONOSCOPY  2012   repeat in 10 yrs  . KNEE ARTHROSCOPY W/ MENISCAL REPAIR Left     OB History    Gravida  1   Para  1   Term  1   Preterm      AB      Living  1     SAB      TAB      Ectopic      Multiple      Live Births  1            Home Medications    Prior to Admission medications   Medication Sig Start Date End Date Taking? Authorizing Provider  atorvastatin (LIPITOR) 10 MG tablet TAKE 1 TABLET BY MOUTH EVERY DAY 08/03/20  Yes Juline Patch, MD  escitalopram (LEXAPRO) 20 MG tablet Take 1 tablet (20 mg total) by mouth daily. 02/10/20  Yes Juline Patch, MD  famotidine (PEPCID) 20 MG tablet Take 1 tablet by mouth daily. singh 09/29/19  Yes [provider]  ferrous sulfate 325 (65 FE) MG EC tablet Take 1 tablet (325 mg total) by mouth daily with breakfast. 02/10/20  Yes Juline Patch, MD  levothyroxine (SYNTHROID) 75 MCG tablet Take 1 tablet (75 mcg total) by mouth daily. 02/10/20  Yes Juline Patch, MD  montelukast (SINGULAIR) 10 MG tablet TAKE 1 TABLET BY MOUTH EVERYDAY AT  BEDTIME 02/10/20  Yes Juline Patch, MD  Multiple Vitamins-Minerals (CENTRUM SILVER 50+WOMEN PO) Take 1 tablet by mouth daily.   Yes [provider]  mupirocin ointment (BACTROBAN) 2 % Place 1 application into the nose 2 (two) times daily. 04/26/20  Yes Juline Patch, MD  solifenacin (VESICARE) 5 MG tablet Take 1 tablet by mouth daily. urology 12/01/19 11/30/20 Yes [provider]  torsemide (DEMADEX) 5 MG tablet Take 1 tablet by mouth daily. singh 01/28/20 08/09/20 Yes [provider]  traZODone (DESYREL) 50 MG tablet TAKE 1 TABLET BY MOUTH EVERY DAY 07/22/20  Yes Juline Patch, MD  vitamin B-12 (CYANOCOBALAMIN) 1000 MCG tablet Take 1,000 mcg by mouth daily.   Yes [provider]  warfarin (COUMADIN) 1 MG tablet TAKE 1 TABLET BY MOUTH EVERY DAY 07/18/20  Yes Juline Patch, MD  warfarin (COUMADIN) 2 MG tablet TAKE 1 TABLET BY MOUTH EVERY DAY 06/07/20  Yes Juline Patch, MD  warfarin (COUMADIN) 3 MG tablet TAKE 1 TABLET BY MOUTH DAILY 06/20/20  Yes Juline Patch, MD  cefdinir (OMNICEF) 300 MG capsule Take 1 capsule (300 mg total) by mouth 2 (two) times daily for 7 days. 08/09/20 08/16/20  Lyndee Hensen, DO    Family History Family History  Problem Relation Age of Onset  . Stroke Father   . Breast cancer Maternal Grandmother   . Kidney cancer Mother   . Cancer Mother   . Arthritis Brother     Social History Social History   Tobacco Use  . Smoking status: Never Smoker  . Smokeless tobacco: Never Used  . Tobacco comment: none  Vaping Use  . Vaping Use: Never used  Substance Use Topics  . Alcohol use: Yes    Alcohol/week: 1.0 standard drink    Types: 1 Glasses of wine per week    Comment: occasional drink  . Drug use: No     Allergies   Penicillin g   Review of Systems Review of Systems  Constitutional: Positive for fever. Negative for chills.  HENT: Negative for congestion and sore throat.   Eyes: Negative for discharge.  Respiratory: Negative for cough and shortness of breath.   Gastrointestinal: Negative for abdominal pain, diarrhea, nausea and vomiting.  Genitourinary: Positive for frequency and urgency. Negative for dysuria, hematuria and pelvic pain.  Musculoskeletal: Positive for myalgias. Negative for arthralgias.  Neurological: Negative for headaches.  All other systems reviewed and are negative.    Physical Exam Triage Vital Signs ED Triage Vitals  Enc Vitals Group     BP 08/09/20 1204 131/89     Pulse Rate 08/09/20 1204 77     Resp 08/09/20 1204 18     Temp 08/09/20 1204 98.4 F (36.9 C)     Temp Source 08/09/20 1204 Oral     SpO2 08/09/20 1204 97 %     Weight 08/09/20 1202 184 lb (83.5 kg)     Height  08/09/20 1202 5\' 8"  (1.727 m)     Head Circumference --      Peak Flow --      Pain Score 08/09/20 1202 0     Pain Loc --      Pain Edu? --      Excl. in [place]? --    No data found.  Updated Vital Signs BP 131/89 (BP Location: Left Arm)   Pulse 77   Temp 98.4 F (36.9 C) (Oral)   Resp 18   Ht 5'  8" (1.727 m)   Wt 184 lb (83.5 kg)   SpO2 97%   BMI 27.98 kg/m   Visual Acuity Right Eye Distance:   Left Eye Distance:   Bilateral Distance:    Right Eye Near:   Left Eye Near:    Bilateral Near:     Physical Exam Vitals and nursing note reviewed.  Constitutional:      General: She is not in acute distress.    Appearance: Normal appearance. She is well-developed. She is not ill-appearing.  HENT:     Head: Normocephalic and atraumatic.     Nose: Nose normal.  Eyes:     Extraocular Movements: Extraocular movements intact.     Conjunctiva/sclera: Conjunctivae normal.  Cardiovascular:     Rate and Rhythm: Normal rate and regular rhythm.     Pulses: Normal pulses.     Heart sounds: Normal heart sounds.  Pulmonary:     Effort: Pulmonary effort is normal. No respiratory distress.     Breath sounds: Normal breath sounds.  Abdominal:     General: There is no distension.     Palpations: Abdomen is soft. There is no mass.     Tenderness: There is no abdominal tenderness. There is no right CVA tenderness, left CVA tenderness, guarding or rebound.  Musculoskeletal:        General: Normal range of motion.     Cervical back: Normal range of motion and neck supple.  Skin:    General: Skin is warm and dry.     Capillary Refill: Capillary refill takes less than 2 seconds.  Neurological:     Mental Status: She is alert and oriented to person, place, and time. Mental status is at baseline.  Psychiatric:        Mood and Affect: Mood normal.        Behavior: Behavior normal.      UC Treatments / Results  Labs (all labs ordered are listed, but only abnormal results are  displayed) Labs Reviewed  URINALYSIS, COMPLETE (UACMP) WITH MICROSCOPIC - Abnormal; Notable for the following components:      Result Value   APPearance HAZY (*)    Hgb urine dipstick TRACE (*)    Protein, ur TRACE (*)    Nitrite POSITIVE (*)    Leukocytes,Ua MODERATE (*)    Bacteria, UA MANY (*)    All other components within normal limits  SARS CORONAVIRUS 2 (TAT 6-24 HRS)  URINE CULTURE    EKG   Radiology No results found.  Procedures Procedures (including critical care time)  Medications Ordered in UC Medications - No data to display  Initial Impression / Assessment and Plan / UC Course  I have reviewed the triage vital signs and the nursing notes.  Pertinent labs & imaging results that were available during my care of the patient were reviewed by me and considered in my medical decision making (see chart for details).     Urine dipstick shows positive for RBC's, positive for protein, positive for nitrates and positive for leukocytes.  Micro exam: >50 WBC's per HPF, 0-5 RBC's per HPF and many bacteria. Patient afebrile and VSS. History and fever and labs c/w pyelonephritis.   Treatments: Antibiotics prescribed. Educational material distributed   Final Clinical Impressions(s) / UC Diagnoses   Final diagnoses:  Pyelonephritis     Discharge Instructions     You were seen at the Urgent Care for fever and urinary frequency. Please pick up your prescriptions at your pharmacy.  Be sure to complete your antibiotics. Follow up with your PCP as needed.   If you haven't already, sign up for My Chart to have easy access to your labs results, and communication with your primary care physician.  Dr. Susa Simmonds      ED Prescriptions    Medication Sig Dispense Auth. Provider   cefdinir (OMNICEF) 300 MG capsule Take 1 capsule (300 mg total) by mouth 2 (two) times daily for 7 days. 14 capsule Mycah Formica, DO     PDMP not reviewed this encounter.   Lyndee Hensen,  DO 08/09/20 1333

## 2020-08-11 ENCOUNTER — Telehealth (HOSPITAL_COMMUNITY): Payer: Self-pay | Admitting: Emergency Medicine

## 2020-08-11 LAB — URINE CULTURE: Culture: >=100000 — AB

## 2020-08-11 MED ORDER — CIPROFLOXACIN HCL 500 MG PO TABS: 500.0000 mg | ORAL_TABLET | Freq: Two times a day (BID) | ORAL | 0 refills | Status: AC

## 2020-08-11 NOTE — Telephone Encounter (Signed)
Per Laurene Footman, APP patient to be treated with Cipro based off urine culture, 500mg  BID x 7 days.  Verified pharmacy with patient and prescription sent.  Patient aware of results and change in therapy.

## 2020-08-17 ENCOUNTER — Telehealth: Payer: Self-pay | Admitting: Family Medicine

## 2020-08-17 NOTE — Telephone Encounter (Signed)
Pt does not have standing orders, as her lab rechecks change from time to time. We will put in the order when she checks in the front

## 2020-08-17 NOTE — Telephone Encounter (Unsigned)
Copied from [place] [phone]. Topic: General - Inquiry >> Aug 17, 2020 12:02 PM Gillis Ends D wrote: Reason for CRM: Patient called and stated that she has standing order for labs every month, Couldn't find one in the chart. She stated that she would like to come in on Monday when her husband comes for his appointment. Please advise the patient

## 2020-08-18 ENCOUNTER — Other Ambulatory Visit: Payer: Self-pay

## 2020-08-18 ENCOUNTER — Encounter: Payer: Medicare Other | Admitting: Physician Assistant

## 2020-08-18 DIAGNOSIS — L97812 Non-pressure chronic ulcer of other part of right lower leg with fat layer exposed: Secondary | ICD-10-CM | POA: Diagnosis not present

## 2020-08-18 DIAGNOSIS — Z86718 Personal history of other venous thrombosis and embolism: Secondary | ICD-10-CM | POA: Diagnosis not present

## 2020-08-18 DIAGNOSIS — I872 Venous insufficiency (chronic) (peripheral): Secondary | ICD-10-CM | POA: Diagnosis not present

## 2020-08-18 NOTE — Progress Notes (Addendum)
[name], [name] ([id number]) Visit Report for 08/18/2020 Chief Complaint Document Details Patient Name: [name], [name]. Date of Service: 08/18/2020 10:00 AM Medical Record Number: [id number] Patient Account Number: [id number] Date of Birth/Sex: 06/14/1951 (68 y.o. F) Treating RN: Grover Canavan Primary Care Provider: Otilio Miu Other Clinician: Referring Provider: Otilio Miu Treating Provider/Extender: Melburn Hake, Caterra Ostroff Weeks in Treatment: 8 Information Obtained from: Patient Chief Complaint Right LE Ulcer Electronic Signature(s) Signed: 08/18/2020 10:57:01 AM By: Worthy Keeler PA-C Entered By: Worthy Keeler on 08/18/2020 10:57:01 [name] ([id number]) -------------------------------------------------------------------------------- HPI Details Patient Name: [name]. Date of Service: 08/18/2020 10:00 AM Medical Record Number: [id number] Patient Account Number: [id number] Date of Birth/Sex: February 18, 1951 (68 y.o. F) Treating RN: Grover Canavan Primary Care Provider: Otilio Miu Other Clinician: Referring Provider: Otilio Miu Treating Provider/Extender: Melburn Hake, Coti Burd Weeks in Treatment: 8 History of Present Illness HPI Description: 06/20/2020 upon evaluation today patient presents for initial evaluation here at clinic concerning wound on her right lower leg secondary to having struck this on a metal chair out in her yard in May 2021. She tells me that this has been managed at this point by her primary care provider Dr. Ronnald Ramp. With that being said Dr. Ronnald Ramp did make a referral to [place] to further evaluate this situation as well. The patient has been on doxycycline though she has not at this point and has been using the mupirocin as prescribed by Dr. Ronnald Ramp. She does have a history of venous insufficiency and has had some previous vascular work-up including an ablation secondary to a blood clot that she had in her leg. Subsequently she also has a  slightly elevated ABI indicating likely some hardening of the arteries in general but nonetheless she seems to have good blood flow into the foot there is no evidence of cyanosis or poor capillary refill. The patient does have pain at the site of the injury. He tells me this did not bruise but nonetheless it seems to be somewhat deep. 07/05/2020 upon evaluation today patient unfortunately does not appear to be doing as well as she was previous. There is no signs of active infection at this time which is good news I really felt like after the debridement we performed last time she would be doing much better but she really is not. She still has a lot of necrotic tissue is very tender and actually has some erythema around the wound. Overall I am concerned that things do not seem to be improving quite as effectively and as much as I would like to see. 07/14/2020 on evaluation today patient appears to still be doing somewhat poorly in regard to her leg ulcer. This does still appear to be infected. I did call in a prescription for clindamycin but the patient apparently missed a message in that regard. This is at the pharmacy for her although originally written for 2 times a day dosing I really want her to take it 3 times a day I told her that as well during conversation in the clinic. This is based on the severity of the wound and what I am seeing today. 07/21/20 upon evaluation patient appears to be doing well with regard to her wound as compared to previous. Fortunately there is no signs of active infection I do believe the clindamycin has done better for her since she started taking that. With that being said I do need to extend this for her because I did have her take it 3 times a  day and it was written for 2 times a day. She is in agreement with that plan. 07/28/2020 on evaluation today patient actually appears to be doing quite well with regard to her leg ulcer. This is making great progress in my opinion  and overall very pleased with where things stand. She does not seem to show any signs of infection right now which is also excellent. 08/04/2020 upon evaluation today patient appears to be doing well with regard to her wound. I do believe she has been tolerating the dressing changes very well without complication. 08/18/2020 on evaluation today patient actually appears to be doing quite well with regard to her wound. This is showing signs of improvement which is great news. With that being said I do believe that the [facility] may be causing some irritation to her skin based on what I am seeing currently. For that reason I think we may want to try changing this up a little bit I do not see any signs of obvious infection if anything changes in that regard obviously the patient will let me know. Electronic Signature(s) Signed: 08/18/2020 11:41:09 AM By: Worthy Keeler PA-C Entered By: Worthy Keeler on 08/18/2020 11:41:08 [name] ([id number]) -------------------------------------------------------------------------------- Physical Exam Details Patient Name: [name], [name]. Date of Service: 08/18/2020 10:00 AM Medical Record Number: [id number] Patient Account Number: [id number] Date of Birth/Sex: 24-Apr-1951 (68 y.o. F) Treating RN: Grover Canavan Primary Care Provider: Otilio Miu Other Clinician: Referring Provider: Otilio Miu Treating Provider/Extender: STONE III, Vernie Piet Weeks in Treatment: 8 Constitutional Well-nourished and well-hydrated in no acute distress. Respiratory normal breathing without difficulty. Psychiatric this patient is able to make decisions and demonstrates good insight into disease process. Alert and Oriented x 3. pleasant and cooperative. Notes Upon inspection patient's wound bed actually showed signs again of good granulation there was no significant slough buildup and I really think that maybe we could switch to a collagen dressing and see if  we get this to feeling more effectively and quickly. Patient is in agreement with that plan. Electronic Signature(s) Signed: 08/18/2020 11:41:28 AM By: Worthy Keeler PA-C Entered By: Worthy Keeler on 08/18/2020 11:41:27 [name] ([id number]) -------------------------------------------------------------------------------- Physician Orders Details Patient Name: [name], [name]. Date of Service: 08/18/2020 10:00 AM Medical Record Number: [id number] Patient Account Number: [id number] Date of Birth/Sex: 04-26-1951 (68 y.o. F) Treating RN: Grover Canavan Primary Care Provider: Otilio Miu Other Clinician: Referring Provider: Otilio Miu Treating Provider/Extender: Melburn Hake, Derward Marple Weeks in Treatment: 8 Verbal / Phone Orders: No Diagnosis Coding ICD-10 Coding Code Description S81.801A Unspecified open wound, right lower leg, initial encounter L97.812 Non-pressure chronic ulcer of other part of right lower leg with fat layer exposed I87.2 Venous insufficiency (chronic) (peripheral) I73.89 Other specified peripheral vascular diseases Wound Cleansing Wound #1 Right,Medial Lower Leg o Clean wound with Normal Saline. o Dial antibacterial soap, wash wounds, rinse and pat dry prior to dressing wounds Anesthetic (add to Medication List) Wound #1 Right,Medial Lower Leg o Topical Lidocaine 4% cream applied to wound bed prior to debridement (In Clinic Only). Primary Wound Dressing Wound #1 Right,Medial Lower Leg o Silver Collagen - moisten with saline Secondary Dressing o Tegaderm o Other - telfa, then cover with tegaderm Dressing Change Frequency Wound #1 Right,Medial Lower Leg o Three times weekly Follow-up Appointments Wound #1 Right,Medial Lower Leg o Return Appointment in 2 weeks. Additional Orders / Instructions Wound #1 Right,Medial Lower Leg o Increase protein intake. Notes Patient needs to get nexcare waterproof  knee and elbow bandages to  cover primary wound dressing Electronic Signature(s) Signed: 08/18/2020 4:34:46 PM By: Grover Canavan Signed: 08/18/2020 5:15:44 PM By: Worthy Keeler PA-C Entered By: Grover Canavan on 08/18/2020 11:06:56 [name] ([id number]) -------------------------------------------------------------------------------- Problem List Details Patient Name: [name], [name]. Date of Service: 08/18/2020 10:00 AM Medical Record Number: [id number] Patient Account Number: [id number] Date of Birth/Sex: 1951-03-05 (68 y.o. F) Treating RN: Grover Canavan Primary Care Provider: Otilio Miu Other Clinician: Referring Provider: Otilio Miu Treating Provider/Extender: Melburn Hake, Demarea Lorey Weeks in Treatment: 8 Active Problems ICD-10 Encounter Code Description Active Date MDM Diagnosis S81.801A Unspecified open wound, right lower leg, initial encounter 06/20/2020 No Yes L97.812 Non-pressure chronic ulcer of other part of right lower leg with fat layer 06/20/2020 No Yes exposed I87.2 Venous insufficiency (chronic) (peripheral) 06/20/2020 No Yes I73.89 Other specified peripheral vascular diseases 06/20/2020 No Yes Inactive Problems Resolved Problems Electronic Signature(s) Signed: 08/18/2020 10:56:49 AM By: Worthy Keeler PA-C Entered By: Worthy Keeler on 08/18/2020 10:56:48 [name] ([id number]) -------------------------------------------------------------------------------- Progress Note Details Patient Name: [name]. Date of Service: 08/18/2020 10:00 AM Medical Record Number: [id number] Patient Account Number: [id number] Date of Birth/Sex: [birth year] (68 y.o. F) Treating RN: Grover Canavan Primary Care Provider: Otilio Miu Other Clinician: Referring Provider: Otilio Miu Treating Provider/Extender: Melburn Hake, Macrina Lehnert Weeks in Treatment: 8 Subjective Chief Complaint Information obtained from Patient Right LE Ulcer History of Present Illness (HPI) 06/20/2020  upon evaluation today patient presents for initial evaluation here at clinic concerning wound on her right lower leg secondary to having struck this on a metal chair out in her yard in May 2021. She tells me that this has been managed at this point by her primary care provider Dr. Ronnald Ramp. With that being said Dr. Ronnald Ramp did make a referral to [place] to further evaluate this situation as well. The patient has been on doxycycline though she has not at this point and has been using the mupirocin as prescribed by Dr. Ronnald Ramp. She does have a history of venous insufficiency and has had some previous vascular work-up including an ablation secondary to a blood clot that she had in her leg. Subsequently she also has a slightly elevated ABI indicating likely some hardening of the arteries in general but nonetheless she seems to have good blood flow into the foot there is no evidence of cyanosis or poor capillary refill. The patient does have pain at the site of the injury. He tells me this did not bruise but nonetheless it seems to be somewhat deep. 07/05/2020 upon evaluation today patient unfortunately does not appear to be doing as well as she was previous. There is no signs of active infection at this time which is good news I really felt like after the debridement we performed last time she would be doing much better but she really is not. She still has a lot of necrotic tissue is very tender and actually has some erythema around the wound. Overall I am concerned that things do not seem to be improving quite as effectively and as much as I would like to see. 07/14/2020 on evaluation today patient appears to still be doing somewhat poorly in regard to her leg ulcer. This does still appear to be infected. I did call in a prescription for clindamycin but the patient apparently missed a message in that regard. This is at the pharmacy for her although originally written for 2 times a day dosing I really want her to take it  3 times a day I told her that as well during conversation in the clinic. This is based on the severity of the wound and what I am seeing today. 07/21/20 upon evaluation patient appears to be doing well with regard to her wound as compared to previous. Fortunately there is no signs of active infection I do believe the clindamycin has done better for her since she started taking that. With that being said I do need to extend this for her because I did have her take it 3 times a day and it was written for 2 times a day. She is in agreement with that plan. 07/28/2020 on evaluation today patient actually appears to be doing quite well with regard to her leg ulcer. This is making great progress in my opinion and overall very pleased with where things stand. She does not seem to show any signs of infection right now which is also excellent. 08/04/2020 upon evaluation today patient appears to be doing well with regard to her wound. I do believe she has been tolerating the dressing changes very well without complication. 08/18/2020 on evaluation today patient actually appears to be doing quite well with regard to her wound. This is showing signs of improvement which is great news. With that being said I do believe that the [facility] may be causing some irritation to her skin based on what I am seeing currently. For that reason I think we may want to try changing this up a little bit I do not see any signs of obvious infection if anything changes in that regard obviously the patient will let me know. Objective Constitutional Well-nourished and well-hydrated in no acute distress. Vitals Time Taken: 10:05 AM, Height: 68 in, Weight: 184 lbs, BMI: 28, Temperature: 98.4 F, Pulse: 65 bpm, Respiratory Rate: 18 breaths/min, Blood Pressure: 142/74 mmHg. Respiratory normal breathing without difficulty. Psychiatric this patient is able to make decisions and demonstrates good insight into disease process. Alert and  Oriented x 3. pleasant and cooperative. [name], [name] ([id number]) General Notes: Upon inspection patient's wound bed actually showed signs again of good granulation there was no significant slough buildup and I really think that maybe we could switch to a collagen dressing and see if we get this to feeling more effectively and quickly. Patient is in agreement with that plan. Integumentary (Hair, Skin) Wound #1 status is Open. Original cause of wound was Trauma. The wound is located on the Right,Medial Lower Leg. The wound measures 0.7cm length x 0.7cm width x 0.1cm depth; 0.385cm^2 area and 0.038cm^3 volume. There is Fat Layer (Subcutaneous Tissue) exposed. There is a medium amount of serosanguineous drainage noted. The wound margin is flat and intact. There is medium (34-66%) pink granulation within the wound bed. There is a medium (34-66%) amount of necrotic tissue within the wound bed including Adherent Slough. Assessment Active Problems ICD-10 Unspecified open wound, right lower leg, initial encounter Non-pressure chronic ulcer of other part of right lower leg with fat layer exposed Venous insufficiency (chronic) (peripheral) Other specified peripheral vascular diseases Plan Wound Cleansing: Wound #1 Right,Medial Lower Leg: Clean wound with Normal Saline. Dial antibacterial soap, wash wounds, rinse and pat dry prior to dressing wounds Anesthetic (add to Medication List): Wound #1 Right,Medial Lower Leg: Topical Lidocaine 4% cream applied to wound bed prior to debridement (In Clinic Only). Primary Wound Dressing: Wound #1 Right,Medial Lower Leg: Silver Collagen - moisten with saline Secondary Dressing: Tegaderm Other - telfa, then cover with tegaderm Dressing Change  Frequency: Wound #1 Right,Medial Lower Leg: Three times weekly Follow-up Appointments: Wound #1 Right,Medial Lower Leg: Return Appointment in 2 weeks. Additional Orders / Instructions: Wound #1 Right,Medial  Lower Leg: Increase protein intake. General Notes: Patient needs to get nexcare waterproof knee and elbow bandages to cover primary wound dressing 1. I would recommend currently that we switch to a silver collagen dressing hopefully this to help this to feeling faster and heal up nicely. Also think she can use a next care bandage over top of this will mimic that today with a Telfa along with Tegaderm over top in order to keep this waterproof and secure. 2. I am also can recommend at this time that she can attempt to go without the Tubigrip although if she starts noticing a lot of swelling she may need to reapply the Tubigrip at that point. We will see patient back for reevaluation in 2 weeks here in the clinic. If anything worsens or changes patient will contact our office for additional recommendations. Electronic Signature(s) Signed: 08/18/2020 11:50:11 AM By: Worthy Keeler PA-C Entered By: Worthy Keeler on 08/18/2020 11:50:11 [name] ([id number]) -------------------------------------------------------------------------------- SuperBill Details Patient Name: [name]. Date of Service: 08/18/2020 Medical Record Number: [id number] Patient Account Number: [id number] Date of Birth/Sex: May 02, 1951 (68 y.o. F) Treating RN: Grover Canavan Primary Care Provider: Otilio Miu Other Clinician: Referring Provider: Otilio Miu Treating Provider/Extender: Melburn Hake, Vernadine Coombs Weeks in Treatment: 8 Diagnosis Coding ICD-10 Codes Code Description S81.801A Unspecified open wound, right lower leg, initial encounter L97.812 Non-pressure chronic ulcer of other part of right lower leg with fat layer exposed I87.2 Venous insufficiency (chronic) (peripheral) I73.89 Other specified peripheral vascular diseases Facility Procedures CPT4 Code: [id number] Description: 99213 - WOUND CARE VISIT-LEV 3 EST PT Modifier: Quantity: 1 Physician Procedures CPT4 Code: 3151761 Description:  60737 - WC PHYS LEVEL 3 - EST PT Modifier: Quantity: 1 CPT4 Code: Description: ICD-10 Diagnosis Description S81.801A Unspecified open wound, right lower leg, initial encounter L97.812 Non-pressure chronic ulcer of other part of right lower leg with fat la I87.2 Venous insufficiency (chronic) (peripheral) I73.89 Other  specified peripheral vascular diseases Modifier: yer exposed Quantity: Electronic Signature(s) Signed: 08/18/2020 11:50:23 AM By: Worthy Keeler PA-C Entered By: Worthy Keeler on 08/18/2020 11:50:22

## 2020-08-18 NOTE — Progress Notes (Signed)
KARENANN, MCGRORY ([id number]) Visit Report for 08/18/2020 Arrival Information Details Patient Name: [name], [name]. Date of Service: 08/18/2020 10:00 AM Medical Record Number: [id number] Patient Account Number: [id number] Date of Birth/Sex: 11-Apr-1951 (68 y.o. F) Treating RN: Grover Canavan Primary Care Jaeleigh Monaco: Otilio Miu Other Clinician: Referring Odile Veloso: Otilio Miu Treating Lonald Troiani/Extender: Melburn Hake, HOYT Weeks in Treatment: 8 Visit Information History Since Last Visit All ordered tests and consults were completed: No Patient Arrived: Ambulatory Added or deleted any medications: No Arrival Time: 10:33 Any new allergies or adverse reactions: No Accompanied By: self Had a fall or experienced change in No Transfer Assistance: None activities of daily living that may affect Patient Identification Verified: Yes risk of falls: Secondary Verification Process Completed: Yes Signs or symptoms of abuse/neglect since last visito No Patient Requires Transmission-Based No Hospitalized since last visit: No Precautions: Implantable device outside of the clinic excluding No Patient Has Alerts: Yes cellular tissue based products placed in the center Patient Alerts: Patient on Blood since last visit: Thinner Pain Present Now: No Warfarin Electronic Signature(s) Signed: 08/18/2020 11:45:17 AM By: Darci Needle Entered By: Darci Needle on 08/18/2020 10:35:11 [name] ([id number]) -------------------------------------------------------------------------------- Clinic Level of Care Assessment Details Patient Name: [name]. Date of Service: 08/18/2020 10:00 AM Medical Record Number: [id number] Patient Account Number: [id number] Date of Birth/Sex: 1951-07-25 (68 y.o. F) Treating RN: Grover Canavan Primary Care Jorrell Kuster: Otilio Miu Other Clinician: Referring Brynn Reznik: Otilio Miu Treating Crissy Mccreadie/Extender: Melburn Hake, HOYT Weeks in  Treatment: 8 Clinic Level of Care Assessment Items TOOL 4 Quantity Score []  - Use when only an EandM is performed on FOLLOW-UP visit 0 ASSESSMENTS - Nursing Assessment / Reassessment X - Reassessment of Co-morbidities (includes updates in patient status) 1 10 X- 1 5 Reassessment of Adherence to Treatment Plan ASSESSMENTS - Wound and Skin Assessment / Reassessment X - Simple Wound Assessment / Reassessment - one wound 1 5 []  - 0 Complex Wound Assessment / Reassessment - multiple wounds []  - 0 Dermatologic / Skin Assessment (not related to wound area) ASSESSMENTS - Focused Assessment []  - Circumferential Edema Measurements - multi extremities 0 []  - 0 Nutritional Assessment / Counseling / Intervention X- 1 5 Lower Extremity Assessment (monofilament, tuning fork, pulses) []  - 0 Peripheral Arterial Disease Assessment (using hand held doppler) ASSESSMENTS - Ostomy and/or Continence Assessment and Care []  - Incontinence Assessment and Management 0 []  - 0 Ostomy Care Assessment and Management (repouching, etc.) PROCESS - Coordination of Care X - Simple Patient / Family Education for ongoing care 1 15 []  - 0 Complex (extensive) Patient / Family Education for ongoing care []  - 0 Staff obtains Programmer, systems, Records, Test Results / Process Orders []  - 0 Staff telephones HHA, Nursing Homes / Clarify orders / etc []  - 0 Routine Transfer to another Facility (non-emergent condition) []  - 0 Routine Hospital Admission (non-emergent condition) []  - 0 New Admissions / Biomedical engineer / Ordering NPWT, Apligraf, etc. []  - 0 Emergency Hospital Admission (emergent condition) X- 1 10 Simple Discharge Coordination []  - 0 Complex (extensive) Discharge Coordination PROCESS - Special Needs []  - Pediatric / Minor Patient Management 0 []  - 0 Isolation Patient Management []  - 0 Hearing / Language / Visual special needs []  - 0 Assessment of Community assistance (transportation, D/C  planning, etc.) []  - 0 Additional assistance / Altered mentation []  - 0 Support Surface(s) Assessment (bed, cushion, seat, etc.) INTERVENTIONS - Wound Cleansing / Measurement [name], [name] ([id number]) X- 1 5 Simple Wound Cleansing -  one wound []  - 0 Complex Wound Cleansing - multiple wounds X- 1 5 Wound Imaging (photographs - any number of wounds) []  - 0 Wound Tracing (instead of photographs) X- 1 5 Simple Wound Measurement - one wound []  - 0 Complex Wound Measurement - multiple wounds INTERVENTIONS - Wound Dressings X - Small Wound Dressing one or multiple wounds 1 10 []  - 0 Medium Wound Dressing one or multiple wounds []  - 0 Large Wound Dressing one or multiple wounds []  - 0 Application of Medications - topical []  - 0 Application of Medications - injection INTERVENTIONS - Miscellaneous []  - External ear exam 0 []  - 0 Specimen Collection (cultures, biopsies, blood, body fluids, etc.) []  - 0 Specimen(s) / Culture(s) sent or taken to Lab for analysis []  - 0 Patient Transfer (multiple staff / Civil Service fast streamer / Similar devices) []  - 0 Simple Staple / Suture removal (25 or less) []  - 0 Complex Staple / Suture removal (26 or more) []  - 0 Hypo / Hyperglycemic Management (close monitor of Blood Glucose) []  - 0 Ankle / Brachial Index (ABI) - do not check if billed separately X- 1 5 Vital Signs Has the patient been seen at the hospital within the last three years: Yes Total Score: 80 Level Of Care: New/Established - Level 3 Electronic Signature(s) Signed: 08/18/2020 4:34:46 PM By: Grover Canavan Entered By: Grover Canavan on 08/18/2020 11:04:32 [name] ([id number]) -------------------------------------------------------------------------------- Encounter Discharge Information Details Patient Name: [name]. Date of Service: 08/18/2020 10:00 AM Medical Record Number: [id number] Patient Account Number: [id number] Date of Birth/Sex: 1951/02/22  (68 y.o. F) Treating RN: Grover Canavan Primary Care Juanantonio Stolar: Otilio Miu Other Clinician: Referring Ugochi Henzler: Otilio Miu Treating Elenna Spratling/Extender: Melburn Hake, HOYT Weeks in Treatment: 8 Encounter Discharge Information Items Discharge Condition: Stable Ambulatory Status: Ambulatory Discharge Destination: Home Transportation: Private Auto Accompanied By: self Schedule Follow-up Appointment: Yes Clinical Summary of Care: Electronic Signature(s) Signed: 08/18/2020 4:34:46 PM By: Grover Canavan Entered By: Grover Canavan on 08/18/2020 11:05:01 [name] ([id number]) -------------------------------------------------------------------------------- Lower Extremity Assessment Details Patient Name: [name]. Date of Service: 08/18/2020 10:00 AM Medical Record Number: [id number] Patient Account Number: [id number] Date of Birth/Sex: 10/28/1951 (68 y.o. F) Treating RN: Grover Canavan Primary Care Kordell Jafri: Otilio Miu Other Clinician: Referring Leen Tworek: Otilio Miu Treating Spiros Greenfeld/Extender: STONE III, HOYT Weeks in Treatment: 8 Edema Assessment Assessed: [Left: No] [Right: No] [Left: Edema] [Right: :] Calf Left: Right: Point of Measurement: 30 cm From Medial Instep cm 30.5 cm Ankle Left: Right: Point of Measurement: 12 cm From Medial Instep cm 20.2 cm Vascular Assessment Pulses: Dorsalis Pedis Palpable: [Right:Yes] Posterior Tibial Palpable: [Right:Yes] Electronic Signature(s) Signed: 08/18/2020 11:45:17 AM By: Darci Needle Signed: 08/18/2020 4:34:46 PM By: Grover Canavan Entered By: Darci Needle on 08/18/2020 10:39:31 [name] ([id number]) -------------------------------------------------------------------------------- Multi Wound Chart Details Patient Name: [name]. Date of Service: 08/18/2020 10:00 AM Medical Record Number: [id number] Patient Account Number: [id number] Date of Birth/Sex: July 13, 1951 (68  y.o. F) Treating RN: Grover Canavan Primary Care Guiseppe Flanagan: Otilio Miu Other Clinician: Referring Trashawn Oquendo: Otilio Miu Treating Kinney Sackmann/Extender: STONE III, HOYT Weeks in Treatment: 8 Vital Signs Height(in): 68 Pulse(bpm): 65 Weight(lbs): 184 Blood Pressure(mmHg): 142/74 Body Mass Index(BMI): 28 Temperature(F): 98.4 Respiratory Rate(breaths/min): 18 Photos: [N/A:N/A] Wound Location: Right, Medial Lower Leg N/A N/A Wounding Event: Trauma N/A N/A Primary Etiology: Trauma, Other N/A N/A Comorbid History: Osteoarthritis N/A N/A Date Acquired: 04/02/2020 N/A N/A Weeks of Treatment: 8 N/A N/A Wound Status: Open N/A N/A Measurements L x W  x D (cm) 0.7x0.7x0.1 N/A N/A Area (cm) : 0.385 N/A N/A Volume (cm) : 0.038 N/A N/A % Reduction in Area: 55.40% N/A N/A % Reduction in Volume: 55.80% N/A N/A Classification: Full Thickness Without Exposed N/A N/A Support Structures Exudate Amount: Medium N/A N/A Exudate Type: Serosanguineous N/A N/A Exudate Color: red, brown N/A N/A Wound Margin: Flat and Intact N/A N/A Granulation Amount: Medium (34-66%) N/A N/A Granulation Quality: Pink N/A N/A Necrotic Amount: Medium (34-66%) N/A N/A Exposed Structures: Fat Layer (Subcutaneous Tissue): N/A N/A Yes Epithelialization: None N/A N/A Treatment Notes Electronic Signature(s) Signed: 08/18/2020 4:34:46 PM By: Grover Canavan Entered By: Grover Canavan on 08/18/2020 10:58:04 [name] ([id number]) -------------------------------------------------------------------------------- [place] Details Patient Name: [name]. Date of Service: 08/18/2020 10:00 AM Medical Record Number: [id number] Patient Account Number: [id number] Date of Birth/Sex: Aug 24, 1951 (68 y.o. F) Treating RN: Grover Canavan Primary Care Siyona Coto: Otilio Miu Other Clinician: Referring Taher Vannote: Otilio Miu Treating Teegan Brandis/Extender: Melburn Hake, HOYT Weeks in Treatment:  8 Active Inactive Necrotic Tissue Nursing Diagnoses: Impaired tissue integrity related to necrotic/devitalized tissue Goals: Necrotic/devitalized tissue will be minimized in the wound bed Date Initiated: 06/20/2020 Target Resolution Date: 06/27/2020 Goal Status: Active Interventions: Assess patient pain level pre-, during and post procedure and prior to discharge Treatment Activities: Apply topical anesthetic as ordered : 06/20/2020 Excisional debridement : 06/20/2020 Notes: Orientation to the Wound Care Program Nursing Diagnoses: Knowledge deficit related to the wound healing center program Goals: Patient/caregiver will verbalize understanding of the [place] Date Initiated: 06/20/2020 Target Resolution Date: 06/27/2020 Goal Status: Active Interventions: Provide education on orientation to the wound center Notes: Wound/Skin Impairment Nursing Diagnoses: Impaired tissue integrity Goals: Ulcer/skin breakdown will have a volume reduction of 30% by week 4 Date Initiated: 06/20/2020 Target Resolution Date: 07/21/2020 Goal Status: Active Interventions: Assess patient/caregiver ability to obtain necessary supplies Assess ulceration(s) every visit Treatment Activities: Skin care regimen initiated : 06/20/2020 Notes: Electronic Signature(s) [name], [name] ([id number]) Signed: 08/18/2020 4:34:46 PM By: Grover Canavan Entered By: Grover Canavan on 08/18/2020 10:57:57 [name] ([id number]) -------------------------------------------------------------------------------- Pain Assessment Details Patient Name: [name]. Date of Service: 08/18/2020 10:00 AM Medical Record Number: [id number] Patient Account Number: [id number] Date of Birth/Sex: 1951-11-16 (68 y.o. F) Treating RN: Grover Canavan Primary Care Latonya Nelon: Otilio Miu Other Clinician: Referring Cory Kitt: Otilio Miu Treating Vauda Salvucci/Extender: Melburn Hake, HOYT Weeks in  Treatment: 8 Active Problems Location of Pain Severity and Description of Pain Patient Has Paino No Site Locations With Dressing Change: No Pain Management and Medication Current Pain Management: Electronic Signature(s) Signed: 08/18/2020 11:45:17 AM By: Darci Needle Signed: 08/18/2020 4:34:46 PM By: Grover Canavan Entered By: Darci Needle on 08/18/2020 10:35:55 [name] ([id number]) -------------------------------------------------------------------------------- Patient/Caregiver Education Details Patient Name: [name]. Date of Service: 08/18/2020 10:00 AM Medical Record Number: [id number] Patient Account Number: [id number] Date of Birth/Gender: 05-31-1951 (68 y.o. F) Treating RN: Grover Canavan Primary Care Physician: Otilio Miu Other Clinician: Referring Physician: Otilio Miu Treating Physician/Extender: Melburn Hake, HOYT Weeks in Treatment: 8 Education Assessment Education Provided To: Patient Education Topics Provided Wound/Skin Impairment: Handouts: Caring for Your Ulcer Methods: Explain/Verbal Responses: State content correctly Electronic Signature(s) Signed: 08/18/2020 4:34:46 PM By: Grover Canavan Entered By: Grover Canavan on 08/18/2020 10:58:22 [name] ([id number]) -------------------------------------------------------------------------------- Wound Assessment Details Patient Name: [name]. Date of Service: 08/18/2020 10:00 AM Medical Record Number: [id number] Patient Account Number: [id number] Date of Birth/Sex: 07-Dec-1950 (68 y.o. F) Treating RN: Grover Canavan Primary Care Elfreda Blanchet: Otilio Miu  Other Clinician: Referring Shadiamond Koska: Otilio Miu Treating Cicily Bonano/Extender: STONE III, HOYT Weeks in Treatment: 8 Wound Status Wound Number: 1 Primary Etiology: Trauma, Other Wound Location: Right, Medial Lower Leg Wound Status: Open Wounding Event: Trauma Comorbid History: Osteoarthritis Date  Acquired: 04/02/2020 Weeks Of Treatment: 8 Clustered Wound: No Photos Wound Measurements Length: (cm) 0.7 Width: (cm) 0.7 Depth: (cm) 0.1 Area: (cm) 0.385 Volume: (cm) 0.038 % Reduction in Area: 55.4% % Reduction in Volume: 55.8% Epithelialization: None Wound Description Classification: Full Thickness Without Exposed Support Structu Wound Margin: Flat and Intact Exudate Amount: Medium Exudate Type: Serosanguineous Exudate Color: red, brown res Foul Odor After Cleansing: No Slough/Fibrino Yes Wound Bed Granulation Amount: Medium (34-66%) Exposed Structure Granulation Quality: Pink Fat Layer (Subcutaneous Tissue) Exposed: Yes Necrotic Amount: Medium (34-66%) Necrotic Quality: Adherent Slough Treatment Notes Wound #1 (Right, Medial Lower Leg) Notes prisma, telfa, tegaderm Electronic Signature(s) Signed: 08/18/2020 11:45:17 AM By: Darci Needle Signed: 08/18/2020 4:34:46 PM By: Grover Canavan Entered By: Darci Needle on 08/18/2020 10:37:38 [name] ([id number]) [name], [name] ([id number]) -------------------------------------------------------------------------------- Vitals Details Patient Name: [name], [name]. Date of Service: 08/18/2020 10:00 AM Medical Record Number: [id number] Patient Account Number: [id number] Date of Birth/Sex: 01-13-1951 (68 y.o. F) Treating RN: Grover Canavan Primary Care Sierra Spargo: Otilio Miu Other Clinician: Referring Erlean Mealor: Otilio Miu Treating Broghan Pannone/Extender: STONE III, HOYT Weeks in Treatment: 8 Vital Signs Time Taken: 10:05 Temperature (F): 98.4 Height (in): 68 Pulse (bpm): 65 Weight (lbs): 184 Respiratory Rate (breaths/min): 18 Body Mass Index (BMI): 28 Blood Pressure (mmHg): 142/74 Reference Range: 80 - 120 mg / dl Electronic Signature(s) Signed: 08/18/2020 11:45:17 AM By: Darci Needle Entered By: Darci Needle on 08/18/2020 10:35:45

## 2020-08-22 ENCOUNTER — Other Ambulatory Visit: Payer: Medicare Other

## 2020-08-22 DIAGNOSIS — Z7901 Long term (current) use of anticoagulants: Secondary | ICD-10-CM | POA: Diagnosis not present

## 2020-08-22 DIAGNOSIS — Z23 Encounter for immunization: Secondary | ICD-10-CM | POA: Diagnosis not present

## 2020-08-23 ENCOUNTER — Other Ambulatory Visit: Payer: Self-pay | Admitting: Family Medicine

## 2020-08-23 DIAGNOSIS — F3342 Major depressive disorder, recurrent, in full remission: Secondary | ICD-10-CM

## 2020-08-23 LAB — PROTIME-INR
INR: 1.9 — ABNORMAL HIGH (ref 0.9–1.2)
Prothrombin Time: 19.1 s — ABNORMAL HIGH (ref 9.1–12.0)

## 2020-08-23 NOTE — Telephone Encounter (Signed)
Requested Prescriptions  Pending Prescriptions Disp Refills  . traZODone (DESYREL) 50 MG tablet [Pharmacy Med Name: TRAZODONE 50 MG TABLET] 90 tablet 0    Sig: TAKE 1 TABLET BY MOUTH EVERY DAY     Psychiatry: Antidepressants - Serotonin Modulator Passed - 08/23/2020  2:33 PM      Passed - Completed PHQ-2 or PHQ-9 in the last 360 days.      Passed - Valid encounter within last 6 months    Recent Outpatient Visits          2 months ago Mild memory disturbance   [facility] Juline Patch, MD   3 months ago Cellulitis of right lower extremity   [facility] Juline Patch, MD   3 months ago Anticoagulant long-term use   [facility] Juline Patch, MD   3 months ago Cellulitis of right lower extremity   [facility] Juline Patch, MD   6 months ago Taking medication for chronic disease   [facility] Juline Patch, MD

## 2020-09-01 ENCOUNTER — Encounter: Payer: Medicare Other | Admitting: Physician Assistant

## 2020-09-01 ENCOUNTER — Other Ambulatory Visit: Payer: Self-pay

## 2020-09-01 DIAGNOSIS — L97812 Non-pressure chronic ulcer of other part of right lower leg with fat layer exposed: Secondary | ICD-10-CM | POA: Diagnosis not present

## 2020-09-01 DIAGNOSIS — Z86718 Personal history of other venous thrombosis and embolism: Secondary | ICD-10-CM | POA: Diagnosis not present

## 2020-09-01 DIAGNOSIS — Z23 Encounter for immunization: Secondary | ICD-10-CM | POA: Diagnosis not present

## 2020-09-01 DIAGNOSIS — I872 Venous insufficiency (chronic) (peripheral): Secondary | ICD-10-CM | POA: Diagnosis not present

## 2020-09-01 NOTE — Progress Notes (Addendum)
HADLEA, FURUYA ([id number]) Visit Report for 09/01/2020 Chief Complaint Document Details Patient Name: [name], [name]. Date of Service: 09/01/2020 2:15 PM Medical Record Number: [id number] Patient Account Number: [id number] Date of Birth/Sex: [birth year] (68 y.o. F) Treating RN: Cornell Barman Primary Care Provider: Otilio Miu Other Clinician: Referring Provider: Otilio Miu Treating Provider/Extender: Melburn Hake, Avanelle Pixley Weeks in Treatment: 10 Information Obtained from: Patient Chief Complaint Right LE Ulcer Electronic Signature(s) Signed: 09/01/2020 2:41:25 PM By: Worthy Keeler PA-C Entered By: Worthy Keeler on 09/01/2020 14:41:24 [name] ([id number]) -------------------------------------------------------------------------------- HPI Details Patient Name: [name]. Date of Service: 09/01/2020 2:15 PM Medical Record Number: [id number] Patient Account Number: [id number] Date of Birth/Sex: 03/15/1951 (68 y.o. F) Treating RN: Cornell Barman Primary Care Provider: Otilio Miu Other Clinician: Referring Provider: Otilio Miu Treating Provider/Extender: Melburn Hake, Nainoa Woldt Weeks in Treatment: 10 History of Present Illness HPI Description: 06/20/2020 upon evaluation today patient presents for initial evaluation here at clinic concerning wound on her right lower leg secondary to having struck this on a metal chair out in her yard in May 2021. She tells me that this has been managed at this point by her primary care provider Dr. Ronnald Ramp. With that being said Dr. Ronnald Ramp did make a referral to [place] to further evaluate this situation as well. The patient has been on doxycycline though she has not at this point and has been using the mupirocin as prescribed by Dr. Ronnald Ramp. She does have a history of venous insufficiency and has had some previous vascular work-up including an ablation secondary to a blood clot that she had in her leg. Subsequently she also has a slightly  elevated ABI indicating likely some hardening of the arteries in general but nonetheless she seems to have good blood flow into the foot there is no evidence of cyanosis or poor capillary refill. The patient does have pain at the site of the injury. He tells me this did not bruise but nonetheless it seems to be somewhat deep. 07/05/2020 upon evaluation today patient unfortunately does not appear to be doing as well as she was previous. There is no signs of active infection at this time which is good news I really felt like after the debridement we performed last time she would be doing much better but she really is not. She still has a lot of necrotic tissue is very tender and actually has some erythema around the wound. Overall I am concerned that things do not seem to be improving quite as effectively and as much as I would like to see. 07/14/2020 on evaluation today patient appears to still be doing somewhat poorly in regard to her leg ulcer. This does still appear to be infected. I did call in a prescription for clindamycin but the patient apparently missed a message in that regard. This is at the pharmacy for her although originally written for 2 times a day dosing I really want her to take it 3 times a day I told her that as well during conversation in the clinic. This is based on the severity of the wound and what I am seeing today. 07/21/20 upon evaluation patient appears to be doing well with regard to her wound as compared to previous. Fortunately there is no signs of active infection I do believe the clindamycin has done better for her since she started taking that. With that being said I do need to extend this for her because I did have her take it 3 times a  day and it was written for 2 times a day. She is in agreement with that plan. 07/28/2020 on evaluation today patient actually appears to be doing quite well with regard to her leg ulcer. This is making great progress in my opinion and  overall very pleased with where things stand. She does not seem to show any signs of infection right now which is also excellent. 08/04/2020 upon evaluation today patient appears to be doing well with regard to her wound. I do believe she has been tolerating the dressing changes very well without complication. 08/18/2020 on evaluation today patient actually appears to be doing quite well with regard to her wound. This is showing signs of improvement which is great news. With that being said I do believe that the [facility] may be causing some irritation to her skin based on what I am seeing currently. For that reason I think we may want to try changing this up a little bit I do not see any signs of obvious infection if anything changes in that regard obviously the patient will let me know. 09/01/2020 upon evaluation today patient appears to be doing extremely well in regard to her wound at this point. Fortunately there is no signs of active infection and overall I think she has actually managed quite nicely. In fact upon closer inspection the wound appears to be healed. Electronic Signature(s) Signed: 09/01/2020 2:47:27 PM By: Worthy Keeler PA-C Entered By: Worthy Keeler on 09/01/2020 14:47:27 [name] ([id number]) -------------------------------------------------------------------------------- Physical Exam Details Patient Name: [name], [name]. Date of Service: 09/01/2020 2:15 PM Medical Record Number: [id number] Patient Account Number: [id number] Date of Birth/Sex: 1951/10/14 (68 y.o. F) Treating RN: Cornell Barman Primary Care Provider: Otilio Miu Other Clinician: Referring Provider: Otilio Miu Treating Provider/Extender: STONE III, Casandra Dallaire Weeks in Treatment: 20 Constitutional Well-nourished and well-hydrated in no acute distress. Respiratory normal breathing without difficulty. Psychiatric this patient is able to make decisions and demonstrates good insight into  disease process. Alert and Oriented x 3. pleasant and cooperative. Notes Patient's wound bed actually showed signs of excellent granulation and epithelization in fact upon close inspection with the light it appears that she is completely healed there is a area of brand-new skin right in the center but nonetheless I think all she really needs is a protective dressing over top of this. Electronic Signature(s) Signed: 09/01/2020 2:47:46 PM By: Worthy Keeler PA-C Entered By: Worthy Keeler on 09/01/2020 14:47:45 [name] ([id number]) -------------------------------------------------------------------------------- Physician Orders Details Patient Name: [name], [name]. Date of Service: 09/01/2020 2:15 PM Medical Record Number: [id number] Patient Account Number: [id number] Date of Birth/Sex: November 22, 1951 (68 y.o. F) Treating RN: Grover Canavan Primary Care Provider: Otilio Miu Other Clinician: Referring Provider: Otilio Miu Treating Provider/Extender: Melburn Hake, Pina Sirianni Weeks in Treatment: 10 Verbal / Phone Orders: No Diagnosis Coding ICD-10 Coding Code Description S81.801A Unspecified open wound, right lower leg, initial encounter L97.812 Non-pressure chronic ulcer of other part of right lower leg with fat layer exposed I87.2 Venous insufficiency (chronic) (peripheral) I73.89 Other specified peripheral vascular diseases Discharge From [facility] Wound #1 Right,Medial Lower Leg o Discharge from [name] use a border foam dressing, changing every couple of days for a week for protection, then discontinue Electronic Signature(s) Signed: 09/01/2020 4:22:43 PM By: Worthy Keeler PA-C Signed: 09/01/2020 4:23:15 PM By: Grover Canavan Entered By: Grover Canavan on 09/01/2020 14:45:43 [name] ([id number]) -------------------------------------------------------------------------------- Problem List Details Patient Name: [name]. Date  of Service: 09/01/2020 2:15 PM Medical Record Number: [id number] Patient Account Number: [id number] Date of Birth/Sex: [birth year] (68 y.o. F) Treating RN: Cornell Barman Primary Care Provider: Otilio Miu Other Clinician: Referring Provider: Otilio Miu Treating Provider/Extender: Melburn Hake, Avriel Kandel Weeks in Treatment: 10 Active Problems ICD-10 Encounter Code Description Active Date MDM Diagnosis S81.801A Unspecified open wound, right lower leg, initial encounter 06/20/2020 No Yes L97.812 Non-pressure chronic ulcer of other part of right lower leg with fat layer 06/20/2020 No Yes exposed I87.2 Venous insufficiency (chronic) (peripheral) 06/20/2020 No Yes I73.89 Other specified peripheral vascular diseases 06/20/2020 No Yes Inactive Problems Resolved Problems Electronic Signature(s) Signed: 09/01/2020 2:41:17 PM By: Worthy Keeler PA-C Entered By: Worthy Keeler on 09/01/2020 14:41:16 [name] ([id number]) -------------------------------------------------------------------------------- Progress Note Details Patient Name: [name]. Date of Service: 09/01/2020 2:15 PM Medical Record Number: [id number] Patient Account Number: [id number] Date of Birth/Sex: [birth year] (68 y.o. F) Treating RN: Cornell Barman Primary Care Provider: Otilio Miu Other Clinician: Referring Provider: Otilio Miu Treating Provider/Extender: Melburn Hake, Olivya Sobol Weeks in Treatment: 10 Subjective Chief Complaint Information obtained from Patient Right LE Ulcer History of Present Illness (HPI) 06/20/2020 upon evaluation today patient presents for initial evaluation here at clinic concerning wound on her right lower leg secondary to having struck this on a metal chair out in her yard in May 2021. She tells me that this has been managed at this point by her primary care provider Dr. Ronnald Ramp. With that being said Dr. Ronnald Ramp did make a referral to [place] to further evaluate this situation as well. The  patient has been on doxycycline though she has not at this point and has been using the mupirocin as prescribed by Dr. Ronnald Ramp. She does have a history of venous insufficiency and has had some previous vascular work-up including an ablation secondary to a blood clot that she had in her leg. Subsequently she also has a slightly elevated ABI indicating likely some hardening of the arteries in general but nonetheless she seems to have good blood flow into the foot there is no evidence of cyanosis or poor capillary refill. The patient does have pain at the site of the injury. He tells me this did not bruise but nonetheless it seems to be somewhat deep. 07/05/2020 upon evaluation today patient unfortunately does not appear to be doing as well as she was previous. There is no signs of active infection at this time which is good news I really felt like after the debridement we performed last time she would be doing much better but she really is not. She still has a lot of necrotic tissue is very tender and actually has some erythema around the wound. Overall I am concerned that things do not seem to be improving quite as effectively and as much as I would like to see. 07/14/2020 on evaluation today patient appears to still be doing somewhat poorly in regard to her leg ulcer. This does still appear to be infected. I did call in a prescription for clindamycin but the patient apparently missed a message in that regard. This is at the pharmacy for her although originally written for 2 times a day dosing I really want her to take it 3 times a day I told her that as well during conversation in the clinic. This is based on the severity of the wound and what I am seeing today. 07/21/20 upon evaluation patient appears to be doing well with regard to her wound as compared to previous. Fortunately  there is no signs of active infection I do believe the clindamycin has done better for her since she started taking that. With  that being said I do need to extend this for her because I did have her take it 3 times a day and it was written for 2 times a day. She is in agreement with that plan. 07/28/2020 on evaluation today patient actually appears to be doing quite well with regard to her leg ulcer. This is making great progress in my opinion and overall very pleased with where things stand. She does not seem to show any signs of infection right now which is also excellent. 08/04/2020 upon evaluation today patient appears to be doing well with regard to her wound. I do believe she has been tolerating the dressing changes very well without complication. 08/18/2020 on evaluation today patient actually appears to be doing quite well with regard to her wound. This is showing signs of improvement which is great news. With that being said I do believe that the [facility] may be causing some irritation to her skin based on what I am seeing currently. For that reason I think we may want to try changing this up a little bit I do not see any signs of obvious infection if anything changes in that regard obviously the patient will let me know. 09/01/2020 upon evaluation today patient appears to be doing extremely well in regard to her wound at this point. Fortunately there is no signs of active infection and overall I think she has actually managed quite nicely. In fact upon closer inspection the wound appears to be healed. Objective Constitutional Well-nourished and well-hydrated in no acute distress. Vitals Time Taken: 2:20 AM, Height: 68 in, Weight: 184 lbs, BMI: 28, Temperature: 98.2 F, Pulse: 73 bpm, Respiratory Rate: 18 breaths/min, Blood Pressure: 135/79 mmHg. Respiratory normal breathing without difficulty. Psychiatric [name], [name] ([id number]) this patient is able to make decisions and demonstrates good insight into disease process. Alert and Oriented x 3. pleasant and cooperative. General Notes: Patient's wound  bed actually showed signs of excellent granulation and epithelization in fact upon close inspection with the light it appears that she is completely healed there is a area of brand-new skin right in the center but nonetheless I think all she really needs is a protective dressing over top of this. Integumentary (Hair, Skin) Wound #1 status is Healed - Epithelialized. Original cause of wound was Trauma. The wound is located on the Right,Medial Lower Leg. The wound measures 0cm length x 0cm width x 0cm depth; 0cm^2 area and 0cm^3 volume. There is Fat Layer (Subcutaneous Tissue) exposed. There is a medium amount of serosanguineous drainage noted. The wound margin is flat and intact. There is medium (34-66%) pink granulation within the wound bed. There is a medium (34-66%) amount of necrotic tissue within the wound bed including Adherent Slough. Assessment Active Problems ICD-10 Unspecified open wound, right lower leg, initial encounter Non-pressure chronic ulcer of other part of right lower leg with fat layer exposed Venous insufficiency (chronic) (peripheral) Other specified peripheral vascular diseases Plan Discharge From [facility]: Wound #1 Right,Medial Lower Leg: Discharge from [facility] Notes: use a border foam dressing, changing every couple of days for a week for protection, then discontinue 1. I would recommend that we go and continue with the wound care measures just for protection she does not need any specialized dressings otherwise I would just recommend a silicone border foam just to prevent any  skin irritation as she seems to be having allergic reaction to the Band-Aid she has been using. I told her she could get some of these from the drugstore. 2. I am also can recommend that the patient continue to monitor for any signs of worsening. Obviously I think she is doing just fine at this point but if she has any other issues she would definitely let me know. We will  see her back for follow-up visit as needed. Electronic Signature(s) Signed: 09/01/2020 2:48:19 PM By: Worthy Keeler PA-C Entered By: Worthy Keeler on 09/01/2020 14:48:19 [name] ([id number]) -------------------------------------------------------------------------------- SuperBill Details Patient Name: [name]. Date of Service: 09/01/2020 Medical Record Number: [id number] Patient Account Number: [id number] Date of Birth/Sex: 29-Oct-1951 (68 y.o. F) Treating RN: Cornell Barman Primary Care Provider: Otilio Miu Other Clinician: Referring Provider: Otilio Miu Treating Provider/Extender: Melburn Hake, Kathyrn Warmuth Weeks in Treatment: 10 Diagnosis Coding ICD-10 Codes Code Description [phone] Unspecified open wound, right lower leg, initial encounter L97.812 Non-pressure chronic ulcer of other part of right lower leg with fat layer exposed I87.2 Venous insufficiency (chronic) (peripheral) I73.89 Other specified peripheral vascular diseases Facility Procedures CPT4 Code: [id number] Description: [phone] - WOUND CARE VISIT-LEV 2 EST PT Modifier: Quantity: 1 Physician Procedures CPT4 Code: 9842103 Description: 12811 - WC PHYS LEVEL 3 - EST PT Modifier: Quantity: 1 CPT4 Code: Description: ICD-10 Diagnosis Description S81.801A Unspecified open wound, right lower leg, initial encounter L97.812 Non-pressure chronic ulcer of other part of right lower leg with fat la I87.2 Venous insufficiency (chronic) (peripheral) I73.89 Other  specified peripheral vascular diseases Modifier: yer exposed Quantity: Electronic Signature(s) Signed: 09/01/2020 2:56:14 PM By: Worthy Keeler PA-C Entered By: Worthy Keeler on 09/01/2020 14:56:13

## 2020-09-06 ENCOUNTER — Other Ambulatory Visit: Payer: Self-pay | Admitting: Family Medicine

## 2020-09-06 DIAGNOSIS — Z7901 Long term (current) use of anticoagulants: Secondary | ICD-10-CM

## 2020-09-06 NOTE — Telephone Encounter (Signed)
Requested medication (s) are due for refill today: yes  Requested medication (s) are on the active medication list: yes   Last refill:  06/07/20 #90 0 refills  Future visit scheduled: no   Notes to clinic:  not delegated per protocol, no valid appt w/I 3 months, no INR w/I 30 days, coumadin 1mg  and 3mg  on medication list.      Requested Prescriptions  Pending Prescriptions Disp Refills   warfarin (COUMADIN) 2 MG tablet [Pharmacy Med Name: WARFARIN SODIUM 2 MG TABLET] 90 tablet 0    Sig: TAKE 1 TABLET BY Dana DAY      Hematology:  Anticoagulants - warfarin Failed - 09/06/2020  2:31 PM      Failed - This refill cannot be delegated      Failed - If the patient is managed by Coumadin Clinic - route to their Pool. If not, forward to the provider.      Failed - INR in normal range and within 30 days    INR  Date Value Ref Range Status  08/22/2020 1.9 (H) 0.9 - 1.2 Final    Comment:    Reference interval is for non-anticoagulated patients. Suggested INR therapeutic range for Vitamin K antagonist therapy:    Standard Dose (moderate intensity                   therapeutic range):       2.0 - 3.0    Higher intensity therapeutic range       2.5 - 3.5   04/23/2012 1.5  Final    Comment:    INR reference interval applies to patients on anticoagulant therapy. A single INR therapeutic range for coumarins is not optimal for all indications; however, the suggested range for most indications is 2.0 - 3.0. Exceptions to the INR Reference Range may include: Prosthetic heart valves, acute myocardial infarction, prevention of myocardial infarction, and combinations of aspirin and anticoagulant. The need for a higher or lower target INR must be assessed individually. Reference: The Pharmacology and Management of the Vitamin K  antagonists: the seventh ACCP Conference on Antithrombotic and Thrombolytic Therapy. VQMGQ.6761 Sept:126 (3suppl): N9146842. A HCT value >55% may artifactually  increase the PT.  In one study,  the increase was an average of 25%. Reference:  "Effect on Routine and Special Coagulation Testing Values of Citrate Anticoagulant Adjustment in Patients with High HCT Values." American Journal of Clinical Pathology 2006;126:400-405.           Failed - Valid encounter within last 3 months    Recent Outpatient Visits           3 months ago Mild memory disturbance   [facility] Juline Patch, MD   3 months ago Cellulitis of right lower extremity   [facility] Juline Patch, MD   4 months ago Anticoagulant long-term use   [facility] Juline Patch, MD   4 months ago Cellulitis of right lower extremity   [facility] Juline Patch, MD   6 months ago Taking medication for chronic disease   [facility] Juline Patch, MD

## 2020-09-11 ENCOUNTER — Other Ambulatory Visit: Payer: Self-pay | Admitting: Family Medicine

## 2020-09-11 DIAGNOSIS — Z7901 Long term (current) use of anticoagulants: Secondary | ICD-10-CM

## 2020-09-11 NOTE — Telephone Encounter (Signed)
Requested medication (s) are due for refill today: yes  Requested medication (s) are on the active medication list: yes  Last refill:  06/20/20 #90   Future visit scheduled: no  Notes to clinic:  overdue blood work   Requested Prescriptions  Pending Prescriptions Disp Refills   warfarin (COUMADIN) 3 MG tablet [Pharmacy Med Name: WARFARIN SODIUM 3 MG TABLET] 90 tablet 0    Sig: TAKE 1 TABLET BY MOUTH EVERY DAY      Hematology:  Anticoagulants - warfarin Failed - 09/11/2020  9:21 AM      Failed - This refill cannot be delegated      Failed - If the patient is managed by Coumadin Clinic - route to their Pool. If not, forward to the provider.      Failed - INR in normal range and within 30 days    INR  Date Value Ref Range Status  08/22/2020 1.9 (H) 0.9 - 1.2 Final    Comment:    Reference interval is for non-anticoagulated patients. Suggested INR therapeutic range for Vitamin K antagonist therapy:    Standard Dose (moderate intensity                   therapeutic range):       2.0 - 3.0    Higher intensity therapeutic range       2.5 - 3.5   04/23/2012 1.5  Final    Comment:    INR reference interval applies to patients on anticoagulant therapy. A single INR therapeutic range for coumarins is not optimal for all indications; however, the suggested range for most indications is 2.0 - 3.0. Exceptions to the INR Reference Range may include: Prosthetic heart valves, acute myocardial infarction, prevention of myocardial infarction, and combinations of aspirin and anticoagulant. The need for a higher or lower target INR must be assessed individually. Reference: The Pharmacology and Management of the Vitamin K  antagonists: the seventh ACCP Conference on Antithrombotic and Thrombolytic Therapy. JIRCV.8938 Sept:126 (3suppl): [id number]. A HCT value >55% may artifactually increase the PT.  In one study,  the increase was an average of 25%. Reference:  "Effect on Routine and Special  Coagulation Testing Values of Citrate Anticoagulant Adjustment in Patients with High HCT Values." American Journal of Clinical Pathology 2006;126:400-405.           Failed - Valid encounter within last 3 months    Recent Outpatient Visits           3 months ago Mild memory disturbance   [facility] Juline Patch, MD   3 months ago Cellulitis of right lower extremity   [facility] Juline Patch, MD   4 months ago Anticoagulant long-term use   [facility] Juline Patch, MD   4 months ago Cellulitis of right lower extremity   [facility] Juline Patch, MD   7 months ago Taking medication for chronic disease   [facility] Juline Patch, MD

## 2020-09-23 DIAGNOSIS — I10 Essential (primary) hypertension: Secondary | ICD-10-CM | POA: Diagnosis not present

## 2020-09-23 DIAGNOSIS — N1832 Chronic kidney disease, stage 3b: Secondary | ICD-10-CM | POA: Diagnosis not present

## 2020-09-23 DIAGNOSIS — R809 Proteinuria, unspecified: Secondary | ICD-10-CM | POA: Diagnosis not present

## 2020-09-23 DIAGNOSIS — N2581 Secondary hyperparathyroidism of renal origin: Secondary | ICD-10-CM | POA: Diagnosis not present

## 2020-09-24 ENCOUNTER — Other Ambulatory Visit: Payer: Self-pay | Admitting: Family Medicine

## 2020-09-24 DIAGNOSIS — F3342 Major depressive disorder, recurrent, in full remission: Secondary | ICD-10-CM

## 2020-09-24 NOTE — Telephone Encounter (Signed)
Requested Prescriptions  Pending Prescriptions Disp Refills   escitalopram (LEXAPRO) 20 MG tablet [Pharmacy Med Name: ESCITALOPRAM 20 MG TABLET] 90 tablet 0    Sig: TAKE 1 TABLET BY MOUTH EVERY DAY     Psychiatry:  Antidepressants - SSRI Passed - 09/24/2020  8:52 AM      Passed - Completed PHQ-2 or PHQ-9 in the last 360 days.      Passed - Valid encounter within last 6 months    Recent Outpatient Visits          3 months ago Mild memory disturbance   [facility] Juline Patch, MD   4 months ago Cellulitis of right lower extremity   [facility] Juline Patch, MD   4 months ago Anticoagulant long-term use   [facility] Juline Patch, MD   5 months ago Cellulitis of right lower extremity   [facility] Juline Patch, MD   7 months ago Taking medication for chronic disease   [facility] Juline Patch, MD

## 2020-10-03 DIAGNOSIS — N1832 Chronic kidney disease, stage 3b: Secondary | ICD-10-CM | POA: Diagnosis not present

## 2020-10-31 DIAGNOSIS — F411 Generalized anxiety disorder: Secondary | ICD-10-CM

## 2020-10-31 DIAGNOSIS — Z8673 Personal history of transient ischemic attack (TIA), and cerebral infarction without residual deficits: Secondary | ICD-10-CM | POA: Diagnosis not present

## 2020-10-31 DIAGNOSIS — R296 Repeated falls: Secondary | ICD-10-CM | POA: Insufficient documentation

## 2020-10-31 DIAGNOSIS — G479 Sleep disorder, unspecified: Secondary | ICD-10-CM | POA: Insufficient documentation

## 2020-10-31 DIAGNOSIS — R413 Other amnesia: Secondary | ICD-10-CM | POA: Diagnosis not present

## 2020-10-31 HISTORY — DX: Generalized anxiety disorder: F41.1

## 2020-11-08 DIAGNOSIS — H2513 Age-related nuclear cataract, bilateral: Secondary | ICD-10-CM | POA: Diagnosis not present

## 2020-11-16 ENCOUNTER — Other Ambulatory Visit: Payer: Self-pay | Admitting: Family Medicine

## 2020-11-16 DIAGNOSIS — F3342 Major depressive disorder, recurrent, in full remission: Secondary | ICD-10-CM

## 2020-11-22 ENCOUNTER — Other Ambulatory Visit: Payer: Self-pay | Admitting: Family Medicine

## 2020-11-22 DIAGNOSIS — F3342 Major depressive disorder, recurrent, in full remission: Secondary | ICD-10-CM

## 2020-12-02 ENCOUNTER — Other Ambulatory Visit: Payer: Self-pay | Admitting: Family Medicine

## 2020-12-02 DIAGNOSIS — Z7901 Long term (current) use of anticoagulants: Secondary | ICD-10-CM

## 2020-12-05 NOTE — Telephone Encounter (Signed)
Requested medication (s) are due for refill today: Yes  Requested medication (s) are on the active medication list: Yes  Last refill:  09/06/20  Future visit scheduled: No  Notes to clinic:  Unable to refill per protocol, cannot delegate     Requested Prescriptions  Pending Prescriptions Disp Refills   warfarin (COUMADIN) 2 MG tablet [Pharmacy Med Name: WARFARIN SODIUM 2 MG TABLET] 90 tablet 0    Sig: TAKE 1 TABLET BY MOUTH EVERY DAY      Hematology:  Anticoagulants - warfarin Failed - 12/02/2020  2:24 PM      Failed - This refill cannot be delegated      Failed - If the patient is managed by [facility] - route to their Pool. If not, forward to the provider.      Failed - INR in normal range and within 30 days    INR  Date Value Ref Range Status  08/22/2020 1.9 (H) 0.9 - 1.2 Final    Comment:    Reference interval is for non-anticoagulated patients. Suggested INR therapeutic range for Vitamin K antagonist therapy:    Standard Dose (moderate intensity                   therapeutic range):       2.0 - 3.0    Higher intensity therapeutic range       2.5 - 3.5   04/23/2012 1.5  Final    Comment:    INR reference interval applies to patients on anticoagulant therapy. A single INR therapeutic range for coumarins is not optimal for all indications; however, the suggested range for most indications is 2.0 - 3.0. Exceptions to the INR Reference Range may include: Prosthetic heart valves, acute myocardial infarction, prevention of myocardial infarction, and combinations of aspirin and anticoagulant. The need for a higher or lower target INR must be assessed individually. Reference: The Pharmacology and Management of the Vitamin K  antagonists: the seventh ACCP Conference on Antithrombotic and Thrombolytic Therapy. Chest.2004 Sept:126 (3suppl): [id number]. A HCT value >55% may artifactually increase the PT.  In one study,  the increase was an average of 25%. Reference:   "Effect on Routine and Special Coagulation Testing Values of Citrate Anticoagulant Adjustment in Patients with High HCT Values." American Journal of Clinical Pathology 2006;126:400-405.           Failed - Valid encounter within last 3 months    Recent Outpatient Visits           6 months ago Mild memory disturbance   [facility] Duanne Limerick, MD   6 months ago Cellulitis of right lower extremity   [facility] Duanne Limerick, MD   7 months ago Anticoagulant long-term use   [facility] Duanne Limerick, MD   7 months ago Cellulitis of right lower extremity   [facility] Duanne Limerick, MD   9 months ago Taking medication for chronic disease   [facility] Duanne Limerick, MD

## 2020-12-09 ENCOUNTER — Other Ambulatory Visit: Payer: Self-pay | Admitting: Family Medicine

## 2020-12-09 DIAGNOSIS — F3342 Major depressive disorder, recurrent, in full remission: Secondary | ICD-10-CM

## 2020-12-09 NOTE — Telephone Encounter (Signed)
Requested medication (s) are due for refill today - soon  Requested medication (s) are on the active medication list -yes  Future visit scheduled -no  Last refill: 11/16/20  Notes to clinic: Request for 90 day supply from pharmacy- sent for PCP review of request  Requested Prescriptions  Pending Prescriptions Disp Refills   traZODone (DESYREL) 50 MG tablet [Pharmacy Med Name: TRAZODONE 50 MG TABLET] 90 tablet 1    Sig: TAKE 1 TABLET BY [place]      Psychiatry: Antidepressants - Serotonin Modulator Failed - 12/09/2020 11:30 AM      Failed - Valid encounter within last 6 months    Recent Outpatient Visits           6 months ago Mild memory disturbance   [facility] Juline Patch, MD   6 months ago Cellulitis of right lower extremity   [facility] Juline Patch, MD   7 months ago Anticoagulant long-term use   [facility] Juline Patch, MD   7 months ago Cellulitis of right lower extremity   [facility] Juline Patch, MD   10 months ago Taking medication for chronic disease   [facility] Juline Patch, MD                Passed - Completed PHQ-2 or PHQ-9 in the last 360 days          Requested Prescriptions  Pending Prescriptions Disp Refills   traZODone (DESYREL) 50 MG tablet [Pharmacy Med Name: TRAZODONE 50 MG TABLET] 90 tablet 1    Sig: TAKE 1 TABLET BY MOUTH EVERY DAY      Psychiatry: Antidepressants - Serotonin Modulator Failed - 12/09/2020 11:30 AM      Failed - Valid encounter within last 6 months    Recent Outpatient Visits           6 months ago Mild memory disturbance   [facility] Juline Patch, MD   6 months ago Cellulitis of right lower extremity   [facility] Juline Patch, MD   7 months ago Anticoagulant long-term use   [facility] Juline Patch, MD   7 months ago Cellulitis of right lower extremity   [facility] Juline Patch, MD    10 months ago Taking medication for chronic disease   [facility] Juline Patch, MD                Passed - Completed PHQ-2 or PHQ-9 in the last 360 days

## 2020-12-10 ENCOUNTER — Other Ambulatory Visit: Payer: Self-pay | Admitting: Family Medicine

## 2020-12-10 DIAGNOSIS — F3342 Major depressive disorder, recurrent, in full remission: Secondary | ICD-10-CM

## 2020-12-11 NOTE — Telephone Encounter (Signed)
Requested medication (s) are due for refill today: yes  Requested medication (s) are on the active medication list: yes  Last refill:  11/16/20   Future visit scheduled: no - sent pt message via MyChart to call office and schedule appt  Notes to clinic:  needs appt   Requested Prescriptions  Pending Prescriptions Disp Refills   traZODone (DESYREL) 50 MG tablet [Pharmacy Med Name: TRAZODONE 50 MG TABLET] 30 tablet 0    Sig: TAKE 1 TABLET BY MOUTH EVERY DAY      Psychiatry: Antidepressants - Serotonin Modulator Failed - 12/10/2020  9:21 AM      Failed - Valid encounter within last 6 months    Recent Outpatient Visits           6 months ago Mild memory disturbance   [facility] Juline Patch, MD   6 months ago Cellulitis of right lower extremity   [facility] Juline Patch, MD   7 months ago Anticoagulant long-term use   [facility] Juline Patch, MD   7 months ago Cellulitis of right lower extremity   [facility] Juline Patch, MD   10 months ago Taking medication for chronic disease   [facility] Juline Patch, MD                Passed - Completed PHQ-2 or PHQ-9 in the last 360 days

## 2020-12-12 ENCOUNTER — Ambulatory Visit (INDEPENDENT_AMBULATORY_CARE_PROVIDER_SITE_OTHER): Payer: Medicare Other | Admitting: Family Medicine

## 2020-12-12 ENCOUNTER — Other Ambulatory Visit: Payer: Self-pay

## 2020-12-12 ENCOUNTER — Encounter: Payer: Self-pay | Admitting: Family Medicine

## 2020-12-12 VITALS — BP 102/64 | HR 60 | Ht 68.0 in | Wt 186.0 lb

## 2020-12-12 DIAGNOSIS — E7801 Familial hypercholesterolemia: Secondary | ICD-10-CM

## 2020-12-12 DIAGNOSIS — D649 Anemia, unspecified: Secondary | ICD-10-CM

## 2020-12-12 DIAGNOSIS — D6861 Antiphospholipid syndrome: Secondary | ICD-10-CM

## 2020-12-12 DIAGNOSIS — F3342 Major depressive disorder, recurrent, in full remission: Secondary | ICD-10-CM

## 2020-12-12 DIAGNOSIS — E039 Hypothyroidism, unspecified: Secondary | ICD-10-CM | POA: Diagnosis not present

## 2020-12-12 MED ORDER — LEVOTHYROXINE SODIUM 75 MCG PO TABS: 75.0000 ug | ORAL_TABLET | Freq: Every day | ORAL | 5 refills | Status: DC

## 2020-12-12 MED ORDER — ESCITALOPRAM OXALATE 20 MG PO TABS: 20.0000 mg | ORAL_TABLET | Freq: Every day | ORAL | 0 refills | Status: AC

## 2020-12-12 MED ORDER — TRAZODONE HCL 50 MG PO TABS: 50.0000 mg | ORAL_TABLET | Freq: Every day | ORAL | 5 refills | Status: DC

## 2020-12-12 MED ORDER — ATORVASTATIN CALCIUM 10 MG PO TABS: 10.0000 mg | ORAL_TABLET | Freq: Every day | ORAL | 1 refills | Status: DC

## 2020-12-12 MED ORDER — FERROUS SULFATE 325 (65 FE) MG PO TBEC: 325.0000 mg | DELAYED_RELEASE_TABLET | Freq: Every day | ORAL | 3 refills | Status: AC

## 2020-12-12 NOTE — Progress Notes (Signed)
Date:  12/12/2020   Name:  [name] [facility]   DOB:  1951-06-30   MRN:  [id number]   Chief Complaint: Anemia, Hypothyroidism, Depression, Insomnia, and anticoagulation use  Anemia Presents for follow-up visit. There has been no abdominal pain, anorexia, bruising/bleeding easily, confusion, fever, leg swelling, light-headedness, malaise/fatigue, pallor, palpitations, paresthesias, pica or weight loss. Signs of blood loss that are not present include hematemesis, hematochezia, melena, menorrhagia and vaginal bleeding. Past medical history includes hypothyroidism. There are no compliance problems.   Depression        This is a chronic problem.  The current episode started more than 1 year ago.   The onset quality is gradual.   The problem occurs intermittently.  The problem has been waxing and waning since onset.  Associated symptoms include insomnia.  Associated symptoms include no decreased concentration, no fatigue, no helplessness, no hopelessness, not irritable, no restlessness, no decreased interest, no appetite change, no body aches, no myalgias, no headaches, no indigestion, not sad and no suicidal ideas.  Past treatments include SSRIs - Selective serotonin reuptake inhibitors.  Compliance with treatment is variable.  Previous treatment provided mild relief.  Past medical history includes hypothyroidism and thyroid problem.   Insomnia Primary symptoms: no fragmented sleep, no sleep disturbance, difficulty falling asleep, no somnolence, no frequent awakening, no premature morning awakening, no malaise/fatigue, no napping.   The problem occurs intermittently. PMH includes: no hypertension, depression.  Thyroid Problem Presents for follow-up visit. Symptoms include diarrhea. Patient reports no anxiety, cold intolerance, constipation, depressed mood, diaphoresis, dry skin, fatigue, hair loss, heat intolerance, hoarse voice, leg swelling, menstrual problem, nail problem, palpitations, tremors,  visual change, weight gain or weight loss. The symptoms have been stable.    Lab Results  Component Value Date   CREATININE 1.59 (H) 02/10/2020   BUN 27 02/10/2020   NA 141 02/10/2020   K 4.2 02/10/2020   CL 102 02/10/2020   CO2 26 02/10/2020   Lab Results  Component Value Date   CHOL 196 02/10/2020   HDL 64 02/10/2020   LDLCALC 111 (H) 02/10/2020   TRIG 122 02/10/2020   CHOLHDL 3.4 09/09/2017   Lab Results  Component Value Date   TSH 2.080 02/10/2020   No results found for: HGBA1C Lab Results  Component Value Date   WBC 5.2 06/03/2020   HGB 10.7 (L) 06/03/2020   HCT 31.1 (L) 06/03/2020   MCV 96 06/03/2020   PLT 239 06/03/2020   Lab Results  Component Value Date   ALT 16 02/10/2020   AST 22 02/10/2020   ALKPHOS 90 02/10/2020   BILITOT 0.6 02/10/2020     Review of Systems  Constitutional: Negative.  Negative for appetite change, chills, diaphoresis, fatigue, fever, malaise/fatigue, unexpected weight change, weight gain and weight loss.  HENT: Negative for congestion, ear discharge, ear pain, hoarse voice, rhinorrhea, sinus pressure, sneezing and sore throat.   Eyes: Negative for double vision, photophobia, pain, discharge, redness and itching.  Respiratory: Negative for cough, shortness of breath, wheezing and stridor.   Cardiovascular: Negative for chest pain, palpitations and leg swelling.  Gastrointestinal: Positive for diarrhea. Negative for abdominal pain, anorexia, blood in stool, constipation, hematemesis, hematochezia, melena, nausea and vomiting.  Endocrine: Negative for cold intolerance, heat intolerance, polydipsia, polyphagia and polyuria.  Genitourinary: Negative for dysuria, flank pain, frequency, hematuria, menorrhagia, menstrual problem, pelvic pain, urgency, vaginal bleeding and vaginal discharge.  Musculoskeletal: Negative for arthralgias, back pain and myalgias.  Skin: Negative for pallor and  rash.  Allergic/Immunologic: Negative for  environmental allergies and food allergies.  Neurological: Negative for dizziness, tremors, weakness, light-headedness, numbness, headaches and paresthesias.  Hematological: Negative for adenopathy. Does not bruise/bleed easily.  Psychiatric/Behavioral: Positive for depression. Negative for confusion, decreased concentration, dysphoric mood, sleep disturbance and suicidal ideas. The patient has insomnia. The patient is not nervous/anxious.     Patient Active Problem List   Diagnosis Date Noted  . Antiphospholipid antibody syndrome ([place]) 02/10/2020  . Acute kidney failure ([place]) 11/04/2019  . Edema of lower extremity 11/04/2019  . Stage 3 chronic kidney disease ([place]) 11/04/2019  . Popliteal bursitis of left knee 08/25/2019  . Familial hypercholesterolemia 08/25/2019  . Renal insufficiency 08/25/2019  . Low hemoglobin 08/25/2019  . History of essential hypertension 08/25/2019  . Seasonal allergic rhinitis due to pollen 02/24/2019  . Age-related osteoporosis without current pathological fracture 01/20/2018  . Osteoporosis 11/12/2017  . Urinary incontinence 05/28/2017  . Knee pain 05/08/2017  . Muscle weakness 05/08/2017  . Sprain of MCL (medial collateral ligament) of knee 05/08/2017  . Adult hypothyroidism 10/23/2016  . Essential hypertension 10/23/2016  . Gastroesophageal reflux disease without esophagitis 10/23/2016  . Recurrent major depressive disorder, in full remission ([place]) 10/23/2016  . Anticoagulant long-term use 10/23/2016    Allergies  Allergen Reactions  . Penicillin G Rash    Past Surgical History:  Procedure Laterality Date  . CESAREAN SECTION    . CHOLECYSTECTOMY    . COLONOSCOPY  2012   repeat in 10 yrs  . KNEE ARTHROSCOPY W/ MENISCAL REPAIR Left     Social History   Tobacco Use  . Smoking status: Never Smoker  . Smokeless tobacco: Never Used  . Tobacco comment: none  Vaping Use  . Vaping Use: Never used  Substance Use Topics  . Alcohol use: Yes     Alcohol/week: 1.0 standard drink    Types: 1 Glasses of wine per week    Comment: occasional drink  . Drug use: No     Medication list has been reviewed and updated.  Current Meds  Medication Sig  . atorvastatin (LIPITOR) 10 MG tablet TAKE 1 TABLET BY MOUTH EVERY DAY  . donepezil (ARICEPT) 10 MG tablet Take 1 tablet by mouth daily.  Marland Kitchen escitalopram (LEXAPRO) 20 MG tablet TAKE 1 TABLET BY MOUTH EVERY DAY  . famotidine (PEPCID) 20 MG tablet Take 1 tablet by mouth daily. singh  . ferrous sulfate 325 (65 FE) MG EC tablet Take 1 tablet (325 mg total) by mouth daily with breakfast.  . levothyroxine (SYNTHROID) 75 MCG tablet Take 1 tablet (75 mcg total) by mouth daily.  . montelukast (SINGULAIR) 10 MG tablet TAKE 1 TABLET BY MOUTH EVERYDAY AT BEDTIME  . Multiple Vitamins-Minerals (CENTRUM SILVER 50+WOMEN PO) Take 1 tablet by mouth daily.  . mupirocin ointment (BACTROBAN) 2 % Place 1 application into the nose 2 (two) times daily.  Marland Kitchen torsemide (DEMADEX) 5 MG tablet Take 1 tablet by mouth daily. singh  . traZODone (DESYREL) 50 MG tablet TAKE 1 TABLET BY MOUTH EVERY DAY  . vitamin B-12 (CYANOCOBALAMIN) 1000 MCG tablet Take 1,000 mcg by mouth daily.  Marland Kitchen warfarin (COUMADIN) 1 MG tablet TAKE 1 TABLET BY MOUTH EVERY DAY  . warfarin (COUMADIN) 2 MG tablet TAKE 1 TABLET BY MOUTH EVERY DAY  . warfarin (COUMADIN) 3 MG tablet TAKE 1 TABLET BY MOUTH EVERY DAY    PHQ 2/9 Scores 12/12/2020 06/03/2020 05/23/2020 04/26/2020  PHQ - 2 Score 0 0 0 0  PHQ-  9 Score 2 0 0 2    GAD 7 : Generalized Anxiety Score 06/03/2020 05/23/2020 04/26/2020 02/10/2020  Nervous, Anxious, on Edge 0 0 0 0  Control/stop worrying 0 0 1 0  Worry too much - different things 0 0 1 0  Trouble relaxing 0 0 0 0  Restless 0 0 0 0  Easily annoyed or irritable 0 0 0 0  Afraid - awful might happen 0 0 0 0  Total GAD 7 Score 0 0 2 0  Anxiety Difficulty - Not difficult at all Not difficult at all -    BP Readings from Last 3 Encounters:   12/12/20 102/64  08/09/20 131/89  06/03/20 120/80    Physical Exam Vitals and nursing note reviewed.  Constitutional:      General: She is not irritable.    Appearance: She is well-developed and well-nourished.  HENT:     Head: Normocephalic.     Right Ear: External ear normal. There is impacted cerumen.     Left Ear: Tympanic membrane and external ear normal.     Nose: Nose normal.     Mouth/Throat:     Mouth: Oropharynx is clear and moist. Mucous membranes are moist.     Pharynx: Oropharynx is clear.  Eyes:     General: Lids are everted, no foreign bodies appreciated. No scleral icterus.       Left eye: No foreign body or hordeolum.     Extraocular Movements: EOM normal.     Conjunctiva/sclera: Conjunctivae normal.     Right eye: Right conjunctiva is not injected.     Left eye: Left conjunctiva is not injected.     Pupils: Pupils are equal, round, and reactive to light.  Neck:     Thyroid: No thyromegaly.     Vascular: No JVD.     Trachea: No tracheal deviation.  Cardiovascular:     Rate and Rhythm: Normal rate and regular rhythm.     Pulses: Intact distal pulses.     Heart sounds: Normal heart sounds. No murmur heard. No friction rub. No gallop.   Pulmonary:     Effort: Pulmonary effort is normal. No respiratory distress.     Breath sounds: Normal breath sounds. No wheezing, rhonchi or rales.  Abdominal:     General: Bowel sounds are normal. There is no distension.     Palpations: Abdomen is soft. There is no hepatosplenomegaly or mass.     Tenderness: There is no abdominal tenderness. There is no right CVA tenderness, left CVA tenderness, guarding or rebound.     Hernia: No hernia is present.  Musculoskeletal:        General: No tenderness or edema. Normal range of motion.     Cervical back: Normal range of motion and neck supple.  Lymphadenopathy:     Cervical: No cervical adenopathy.  Skin:    General: Skin is warm.     Capillary Refill: Capillary refill  takes less than 2 seconds.     Coloration: Skin is not jaundiced or pale.     Findings: No bruising, erythema, lesion or rash.  Neurological:     Mental Status: She is alert and oriented to person, place, and time.     Cranial Nerves: No cranial nerve deficit.     Deep Tendon Reflexes: Strength normal. Reflexes normal.  Psychiatric:        Mood and Affect: Mood and affect normal. Mood is not anxious or depressed.  Wt Readings from Last 3 Encounters:  12/12/20 186 lb (84.4 kg)  08/09/20 184 lb (83.5 kg)  06/03/20 188 lb (85.3 kg)    BP 102/64   Pulse 60   Ht 5\' 8"  (1.727 m)   Wt 186 lb (84.4 kg)   BMI 28.28 kg/m   Assessment and Plan: 1. Familial hypercholesterolemia Chronic.  Controlled.  Stable.  Continue atorvastatin 10 mg once a day.   - atorvastatin (LIPITOR) 10 MG tablet; Take 1 tablet (10 mg total) by mouth daily.  Dispense: 90 tablet; Refill: 1  2. Recurrent major depressive disorder, in full remission ([place]) Chronic.  Controlled.  Stable.  Continue Lexapro 20 mg once a day.  PHQ is 1.  Patient uses trazodone primarily for insomnia. - escitalopram (LEXAPRO) 20 MG tablet; Take 1 tablet (20 mg total) by mouth daily.  Dispense: 90 tablet; Refill: 0 - traZODone (DESYREL) 50 MG tablet; Take 1 tablet (50 mg total) by mouth daily.  Dispense: 30 tablet; Refill: 5  3. Low hemoglobin Chronic.  Controlled.  Stable.  We will check patient's hemoglobin and determine if ferrous sulfate 325 mg daily is sufficient. - ferrous sulfate 325 (65 FE) MG EC tablet; Take 1 tablet (325 mg total) by mouth daily with breakfast.  Dispense: 30 tablet; Refill: 3 - Hemoglobin  4. Adult hypothyroidism .  Controlled.  Stable.  Will check TSH/and continue levothyroxine at 75 mcg daily. - levothyroxine (SYNTHROID) 75 MCG tablet; Take 1 tablet (75 mcg total) by mouth daily.  Dispense: 30 tablet; Refill: 5 - Thyroid Panel With TSH  5. Antiphospholipid antibody syndrome (H[facility]) Chronic.  Controlled.   Stable.  In the past however patient has not returned for her PT/INR thinking that it was likely done with Dr. Holley Raring.  This was last done 3 months ago and we will check PT/INR today and patient understands that we need to be checking on a every 4-6 weeks. - INR/PT

## 2020-12-13 LAB — THYROID PANEL WITH TSH
Free Thyroxine Index: 1.7 (ref 1.2–4.9)
T3 Uptake Ratio: 27 % (ref 24–39)
T4, Total: 6.3 ug/dL (ref 4.5–12.0)
TSH: 2.92 u[IU]/mL (ref 0.450–4.500)

## 2020-12-13 LAB — PROTIME-INR
INR: 1.2 (ref 0.9–1.2)
Prothrombin Time: 12.1 s — ABNORMAL HIGH (ref 9.1–12.0)

## 2020-12-13 LAB — HEMOGLOBIN: Hemoglobin: 11.1 g/dL (ref 11.1–15.9)

## 2020-12-26 ENCOUNTER — Emergency Department: Payer: Medicare Other

## 2020-12-26 ENCOUNTER — Inpatient Hospital Stay
Admission: EM | Admit: 2020-12-26 | Discharge: 2020-12-29 | DRG: 065 | Disposition: A | Discharge status: Rehabilitation Facility | Payer: Medicare Other | Attending: Internal Medicine | Admitting: Internal Medicine

## 2020-12-26 ENCOUNTER — Ambulatory Visit: Payer: Self-pay | Admitting: Family Medicine

## 2020-12-26 DIAGNOSIS — I10 Essential (primary) hypertension: Secondary | ICD-10-CM | POA: Diagnosis present

## 2020-12-26 DIAGNOSIS — F32A Depression, unspecified: Secondary | ICD-10-CM | POA: Diagnosis present

## 2020-12-26 DIAGNOSIS — Z8051 Family history of malignant neoplasm of kidney: Secondary | ICD-10-CM

## 2020-12-26 DIAGNOSIS — D696 Thrombocytopenia, unspecified: Secondary | ICD-10-CM | POA: Diagnosis present

## 2020-12-26 DIAGNOSIS — K219 Gastro-esophageal reflux disease without esophagitis: Secondary | ICD-10-CM | POA: Diagnosis present

## 2020-12-26 DIAGNOSIS — R531 Weakness: Secondary | ICD-10-CM | POA: Diagnosis not present

## 2020-12-26 DIAGNOSIS — N1832 Chronic kidney disease, stage 3b: Secondary | ICD-10-CM | POA: Diagnosis present

## 2020-12-26 DIAGNOSIS — Z880 Allergy status to penicillin: Secondary | ICD-10-CM

## 2020-12-26 DIAGNOSIS — Z86718 Personal history of other venous thrombosis and embolism: Secondary | ICD-10-CM

## 2020-12-26 DIAGNOSIS — R29898 Other symptoms and signs involving the musculoskeletal system: Secondary | ICD-10-CM | POA: Diagnosis not present

## 2020-12-26 DIAGNOSIS — Z823 Family history of stroke: Secondary | ICD-10-CM | POA: Diagnosis not present

## 2020-12-26 DIAGNOSIS — H53462 Homonymous bilateral field defects, left side: Secondary | ICD-10-CM | POA: Diagnosis present

## 2020-12-26 DIAGNOSIS — M1990 Unspecified osteoarthritis, unspecified site: Secondary | ICD-10-CM | POA: Diagnosis present

## 2020-12-26 DIAGNOSIS — E039 Hypothyroidism, unspecified: Secondary | ICD-10-CM | POA: Diagnosis not present

## 2020-12-26 DIAGNOSIS — N183 Chronic kidney disease, stage 3 unspecified: Secondary | ICD-10-CM | POA: Diagnosis present

## 2020-12-26 DIAGNOSIS — M810 Age-related osteoporosis without current pathological fracture: Secondary | ICD-10-CM | POA: Diagnosis not present

## 2020-12-26 DIAGNOSIS — Z20822 Contact with and (suspected) exposure to covid-19: Secondary | ICD-10-CM | POA: Diagnosis not present

## 2020-12-26 DIAGNOSIS — R2990 Unspecified symptoms and signs involving the nervous system: Secondary | ICD-10-CM

## 2020-12-26 DIAGNOSIS — I82409 Acute embolism and thrombosis of unspecified deep veins of unspecified lower extremity: Secondary | ICD-10-CM

## 2020-12-26 DIAGNOSIS — Z803 Family history of malignant neoplasm of breast: Secondary | ICD-10-CM

## 2020-12-26 DIAGNOSIS — R52 Pain, unspecified: Secondary | ICD-10-CM | POA: Diagnosis not present

## 2020-12-26 DIAGNOSIS — R29810 Facial weakness: Secondary | ICD-10-CM | POA: Diagnosis not present

## 2020-12-26 DIAGNOSIS — D6861 Antiphospholipid syndrome: Secondary | ICD-10-CM | POA: Diagnosis not present

## 2020-12-26 DIAGNOSIS — I129 Hypertensive chronic kidney disease with stage 1 through stage 4 chronic kidney disease, or unspecified chronic kidney disease: Secondary | ICD-10-CM | POA: Diagnosis not present

## 2020-12-26 DIAGNOSIS — M6281 Muscle weakness (generalized): Secondary | ICD-10-CM | POA: Diagnosis not present

## 2020-12-26 DIAGNOSIS — E7801 Familial hypercholesterolemia: Secondary | ICD-10-CM | POA: Diagnosis present

## 2020-12-26 DIAGNOSIS — Z7901 Long term (current) use of anticoagulants: Secondary | ICD-10-CM

## 2020-12-26 DIAGNOSIS — I6622 Occlusion and stenosis of left posterior cerebral artery: Secondary | ICD-10-CM | POA: Diagnosis not present

## 2020-12-26 DIAGNOSIS — Z79890 Hormone replacement therapy (postmenopausal): Secondary | ICD-10-CM

## 2020-12-26 DIAGNOSIS — I639 Cerebral infarction, unspecified: Secondary | ICD-10-CM | POA: Diagnosis not present

## 2020-12-26 DIAGNOSIS — M79605 Pain in left leg: Secondary | ICD-10-CM

## 2020-12-26 DIAGNOSIS — Z8261 Family history of arthritis: Secondary | ICD-10-CM

## 2020-12-26 DIAGNOSIS — M79604 Pain in right leg: Secondary | ICD-10-CM

## 2020-12-26 DIAGNOSIS — I672 Cerebral atherosclerosis: Secondary | ICD-10-CM | POA: Diagnosis not present

## 2020-12-26 DIAGNOSIS — E785 Hyperlipidemia, unspecified: Secondary | ICD-10-CM | POA: Diagnosis present

## 2020-12-26 DIAGNOSIS — Z79899 Other long term (current) drug therapy: Secondary | ICD-10-CM | POA: Diagnosis not present

## 2020-12-26 DIAGNOSIS — R29701 NIHSS score 1: Secondary | ICD-10-CM | POA: Diagnosis not present

## 2020-12-26 DIAGNOSIS — R29818 Other symptoms and signs involving the nervous system: Secondary | ICD-10-CM | POA: Diagnosis not present

## 2020-12-26 DIAGNOSIS — G8191 Hemiplegia, unspecified affecting right dominant side: Secondary | ICD-10-CM | POA: Diagnosis present

## 2020-12-26 DIAGNOSIS — I6782 Cerebral ischemia: Secondary | ICD-10-CM | POA: Diagnosis not present

## 2020-12-26 LAB — COMPREHENSIVE METABOLIC PANEL
ALT: 16 U/L (ref 0–44)
AST: 22 U/L (ref 15–41)
Albumin: 4 g/dL (ref 3.5–5.0)
Alkaline Phosphatase: 65 U/L (ref 38–126)
Anion gap: 12 (ref 5–15)
BUN: 30 mg/dL — ABNORMAL HIGH (ref 8–23)
CO2: 24 mmol/L (ref 22–32)
Calcium: 9.6 mg/dL (ref 8.9–10.3)
Chloride: 102 mmol/L (ref 98–111)
Creatinine, Ser: 1.68 mg/dL — ABNORMAL HIGH (ref 0.44–1.00)
GFR, Estimated: 33 mL/min — ABNORMAL LOW (ref >60)
Glucose, Bld: 102 mg/dL — ABNORMAL HIGH (ref 70–99)
Potassium: 3.9 mmol/L (ref 3.5–5.1)
Sodium: 138 mmol/L (ref 135–145)
Total Bilirubin: 1 mg/dL (ref 0.3–1.2)
Total Protein: 7.2 g/dL (ref 6.5–8.1)

## 2020-12-26 LAB — DIFFERENTIAL
Abs Immature Granulocytes: 0.02 10*3/uL (ref 0.00–0.07)
Basophils Absolute: 0.1 10*3/uL (ref 0.0–0.1)
Basophils Relative: 1 %
Eosinophils Absolute: 0.1 10*3/uL (ref 0.0–0.5)
Eosinophils Relative: 1 %
Immature Granulocytes: 0 %
Lymphocytes Relative: 17 %
Lymphs Abs: 1.1 10*3/uL (ref 0.7–4.0)
Monocytes Absolute: 0.5 10*3/uL (ref 0.1–1.0)
Monocytes Relative: 8 %
Neutro Abs: 4.6 10*3/uL (ref 1.7–7.7)
Neutrophils Relative %: 73 %

## 2020-12-26 LAB — URINALYSIS, ROUTINE W REFLEX MICROSCOPIC
Bilirubin Urine: NEGATIVE
Glucose, UA: NEGATIVE mg/dL
Hgb urine dipstick: NEGATIVE
Ketones, ur: NEGATIVE mg/dL
Leukocytes,Ua: NEGATIVE
Nitrite: NEGATIVE
Protein, ur: NEGATIVE mg/dL
Specific Gravity, Urine: 1.004 — ABNORMAL LOW (ref 1.005–1.030)
pH: 7 (ref 5.0–8.0)

## 2020-12-26 LAB — CBC
HCT: 31.9 % — ABNORMAL LOW (ref 36.0–46.0)
Hemoglobin: 10.6 g/dL — ABNORMAL LOW (ref 12.0–15.0)
MCH: 33 pg (ref 26.0–34.0)
MCHC: 33.2 g/dL (ref 30.0–36.0)
MCV: 99.4 fL (ref 80.0–100.0)
Platelets: 86 10*3/uL — ABNORMAL LOW (ref 150–400)
RBC: 3.21 MIL/uL — ABNORMAL LOW (ref 3.87–5.11)
RDW: 12 % (ref 11.5–15.5)
WBC: 6.3 10*3/uL (ref 4.0–10.5)
nRBC: 0 % (ref 0.0–0.2)

## 2020-12-26 LAB — URINE DRUG SCREEN, QUALITATIVE (ARMC ONLY)
Amphetamines, Ur Screen: NOT DETECTED
Barbiturates, Ur Screen: NOT DETECTED
Benzodiazepine, Ur Scrn: NOT DETECTED
Cannabinoid 50 Ng, Ur ~~LOC~~: NOT DETECTED
Cocaine Metabolite,Ur ~~LOC~~: NOT DETECTED
MDMA (Ecstasy)Ur Screen: NOT DETECTED
Methadone Scn, Ur: NOT DETECTED
Opiate, Ur Screen: NOT DETECTED
Phencyclidine (PCP) Ur S: NOT DETECTED
Tricyclic, Ur Screen: NOT DETECTED

## 2020-12-26 LAB — PROTIME-INR
INR: 1.2 (ref 0.8–1.2)
Prothrombin Time: 15.1 seconds (ref 11.4–15.2)

## 2020-12-26 LAB — ETHANOL: Alcohol, Ethyl (B): <10 mg/dL (ref <10)

## 2020-12-26 LAB — APTT: aPTT: 79 seconds — ABNORMAL HIGH (ref 24–36)

## 2020-12-26 LAB — SARS CORONAVIRUS 2 BY RT PCR (HOSPITAL ORDER, PERFORMED IN ~~LOC~~ HOSPITAL LAB): SARS Coronavirus 2: NEGATIVE

## 2020-12-26 LAB — CBG MONITORING, ED: Glucose-Capillary: 89 mg/dL (ref 70–99)

## 2020-12-26 IMAGING — MR MR MRA HEAD W/O CM
1 series · 18 of 48 positions shown · non-contrast
Comparison: None.

CLINICAL DATA: Acute neurologic deficits.



[Series 9: TOF · axial · 0.5mm · 0.41mm/px · z∈[-108,-10]mm · 18 of 205 slices shown]
[im 1/205]
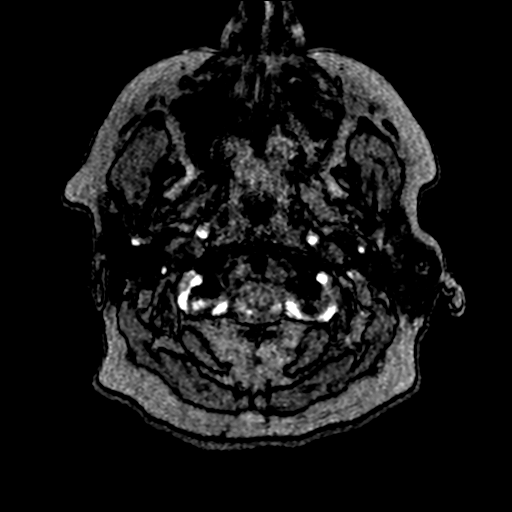
[im 5/205]
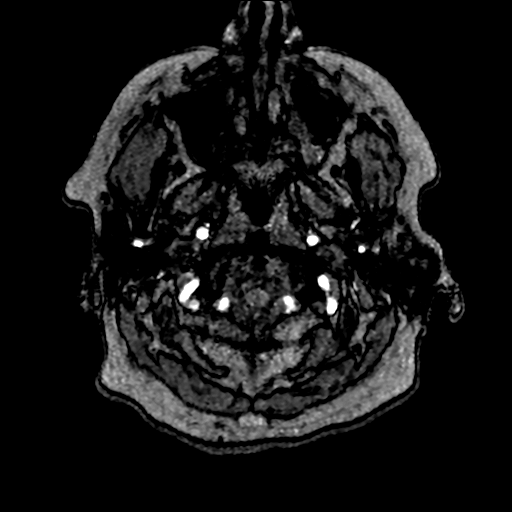
[im 9/205]
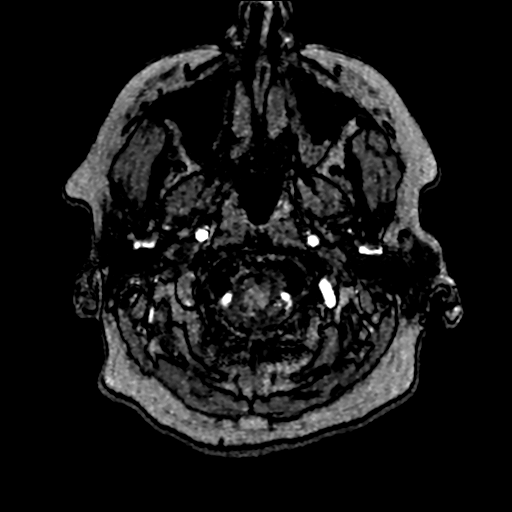
[im 14/205]
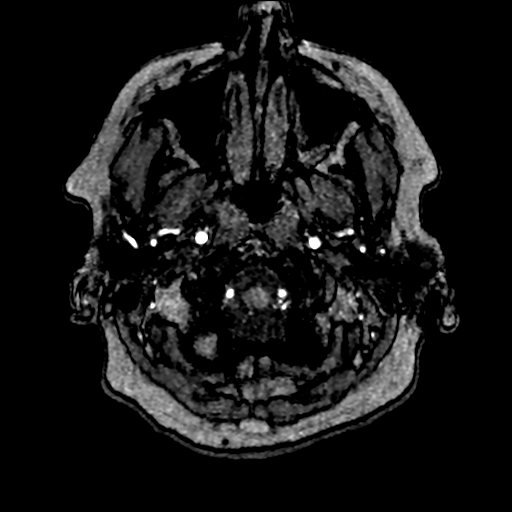
[im 18/205]
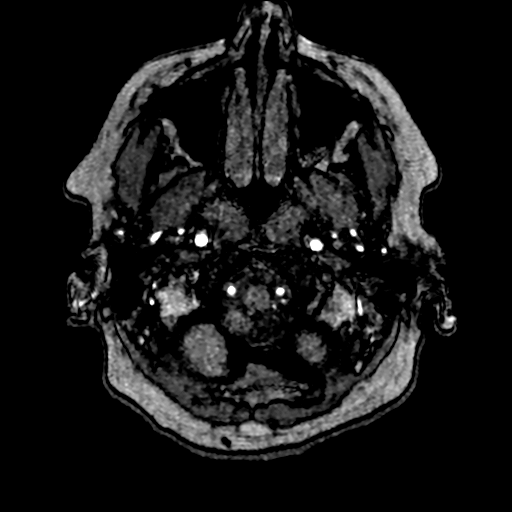
[im 22/205]
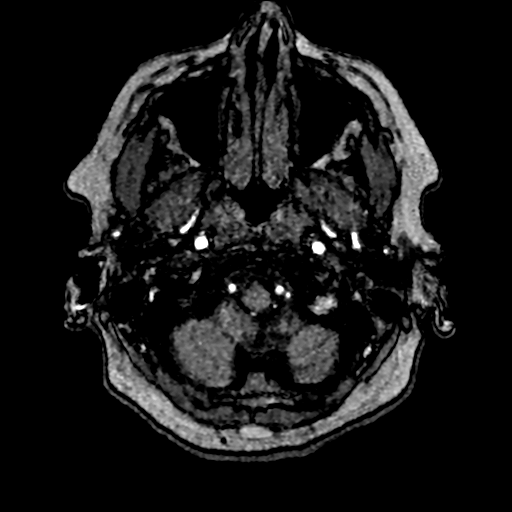
[im 27/205]
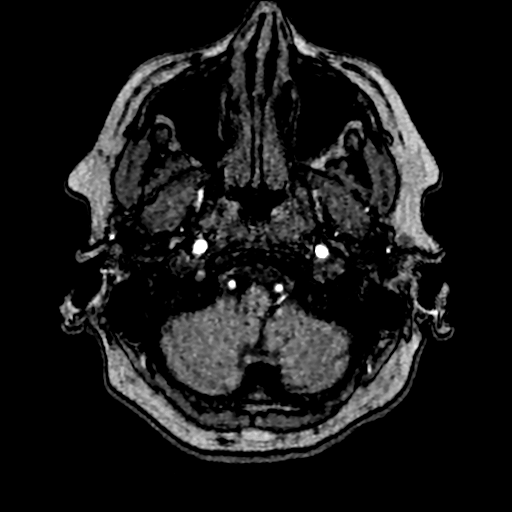
[im 31/205]
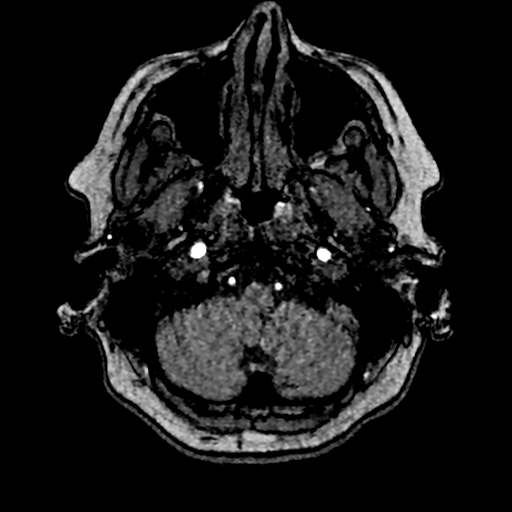
[im 35/205]
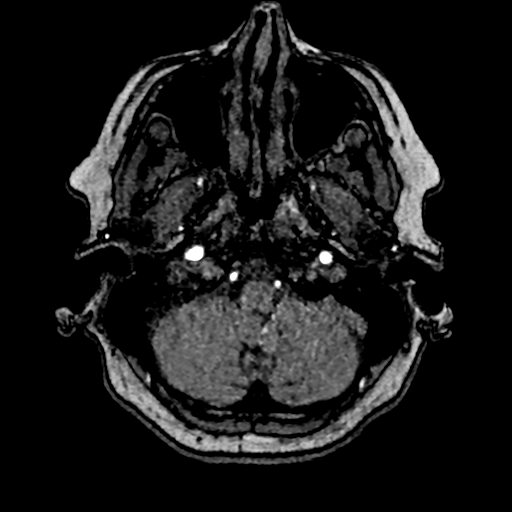
[im 40/205]
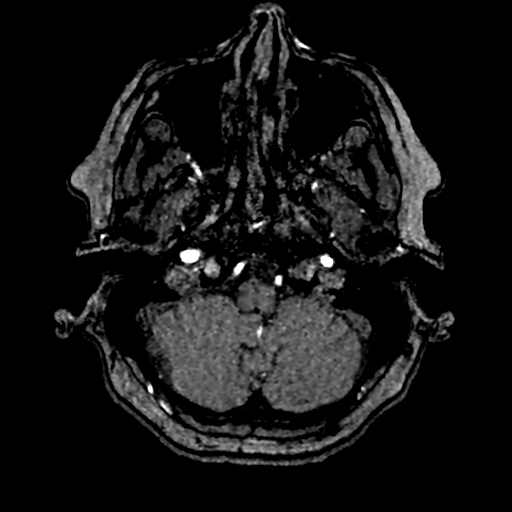
[im 66/205]
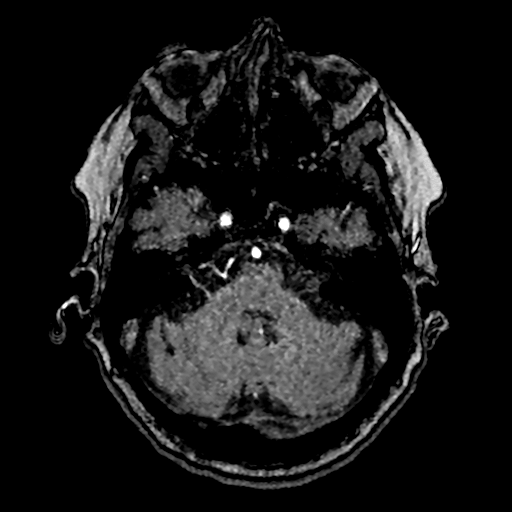
[im 92/205]
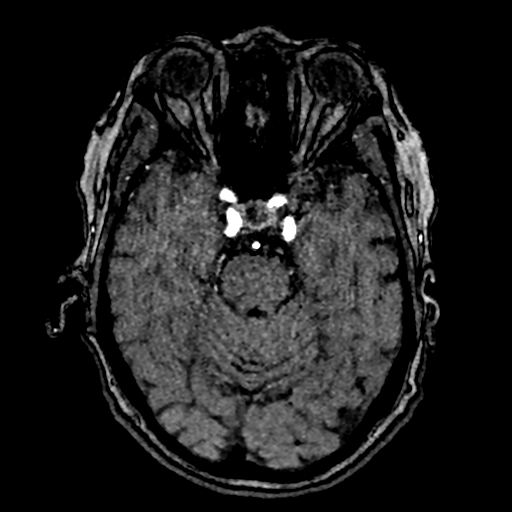
[im 105/205]
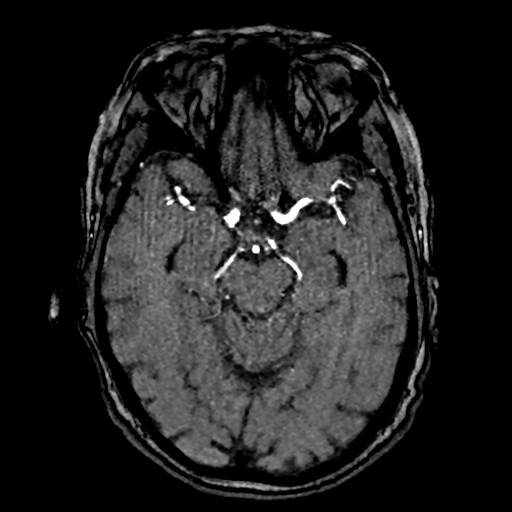
[im 118/205]
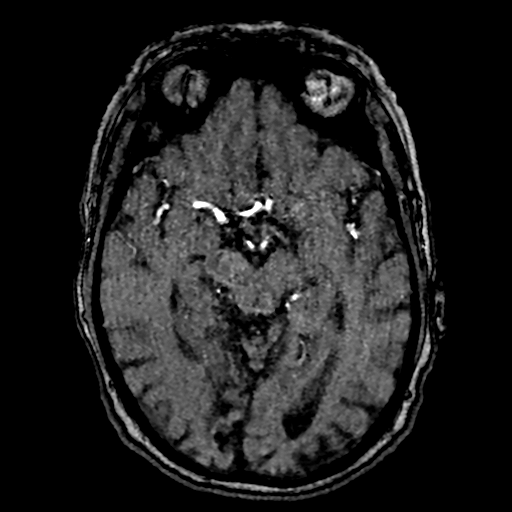
[im 144/205]
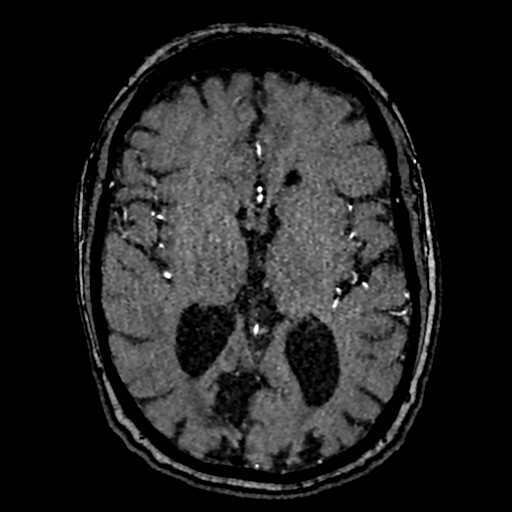
[im 170/205]
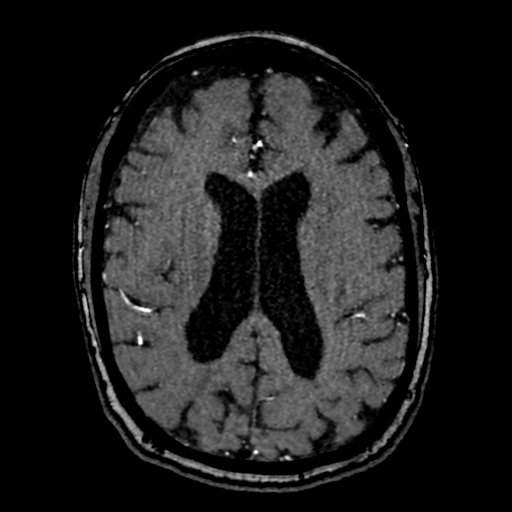
[im 174/205]
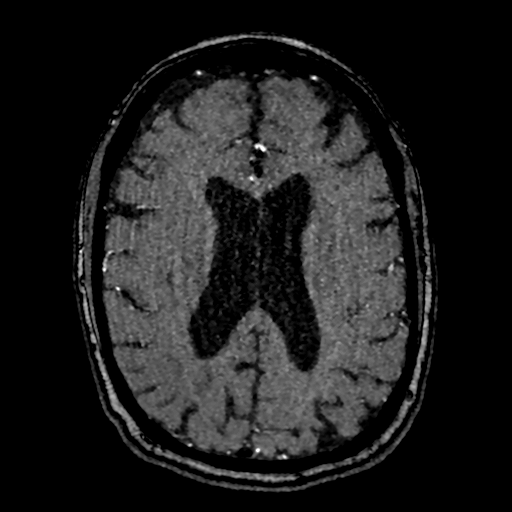
[im 196/205]
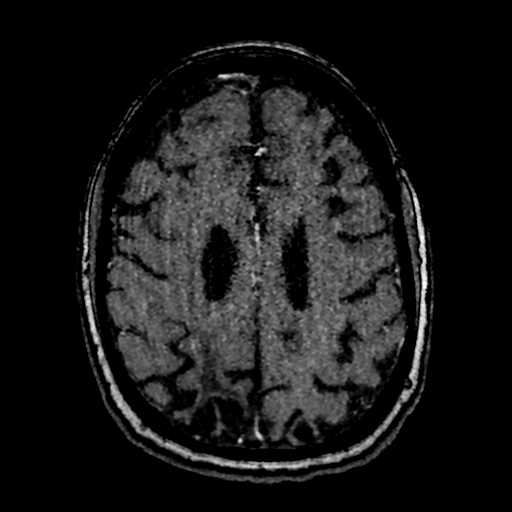

[18 of 48 positions shown; findings below may reference images not displayed]

FINDINGS: MRI HEAD FINDINGS

Brain: Small acute infarct within the anterior right frontal lobe.
There are old infarcts of both cerebellar hemispheres, the left
frontal lobe, the right parietal lobe and the right occipital lobe.
No acute or chronic hemorrhage. There is multifocal hyperintense
T2-weighted signal within the white matter. Generalized volume loss
without a clear lobar predilection. The midline structures are
normal.

Vascular: Major flow voids are preserved.

Skull and upper cervical spine: Normal calvarium and skull base.
Visualized upper cervical spine and soft tissues are normal.

Sinuses/Orbits:No paranasal sinus fluid levels or advanced mucosal
thickening. No mastoid or middle ear effusion. Normal orbits.

MRA HEAD FINDINGS

POSTERIOR CIRCULATION:

--Vertebral arteries: Normal

--Inferior cerebellar arteries: Normal.

--Basilar artery: Normal.

--Superior cerebellar arteries: Normal.

--Posterior cerebral arteries: Distal occlusion of the right PCA is
likely chronic given old PCA territory infarct. Moderate stenosis of
the left P4 segment.

ANTERIOR CIRCULATION:

--Intracranial internal carotid arteries: Normal.

--Anterior cerebral arteries (ACA): Normal.

--Middle cerebral arteries (MCA): Normal.

ANATOMIC VARIANTS: None

MRA NECK FINDINGS

Codominant vertebral arteries are normal along the entirety of the
visualized portion. Time-of-flight imaging poorly images of the V1
and V3 segments. No carotid stenosis.
IMPRESSION: 1. Small acute infarct within the anterior right frontal lobe. No
hemorrhage or mass effect.
2. Multiple old infarcts and chronic small vessel ischemia.
3. Distal occlusion of the right PCA is likely chronic given old PCA
territory infarct.
4. Moderate stenosis of the left P4 segment.
5. Time-of-flight MRA of the neck without hemodynamically
significant stenosis.

## 2020-12-26 IMAGING — CT CT HEAD CODE STROKE
3 series · 15 of 47 positions shown, 18 images · non-contrast
Comparison: [DATE]

CLINICAL DATA: Code stroke.  Right arm and leg weakness

EXAM:
CT HEAD WITHOUT CONTRAST
TECHNIQUE: Contiguous axial images were obtained from the base of the skull
through the vertex without intravenous contrast.

[Series 3: head wo · axial · 0.44mm/px · z∈[+1160,+1295]mm · 9 of 33 slices shown, 12 images]
[im 3/33  brain]
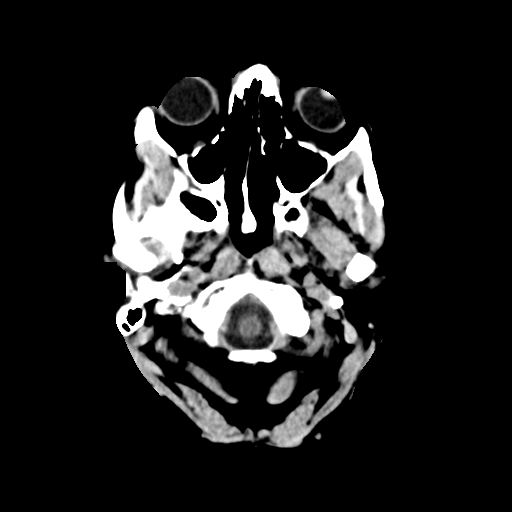
[im 3/33  bone]
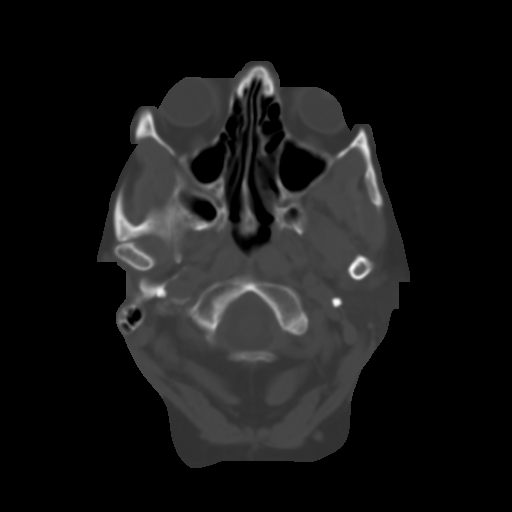
[im 6/33  brain]
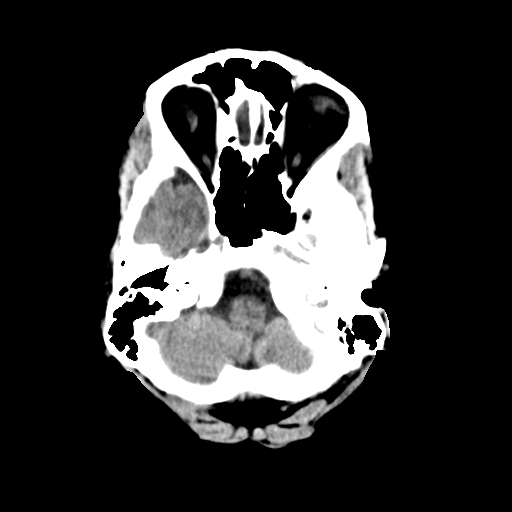
[im 9/33  brain]
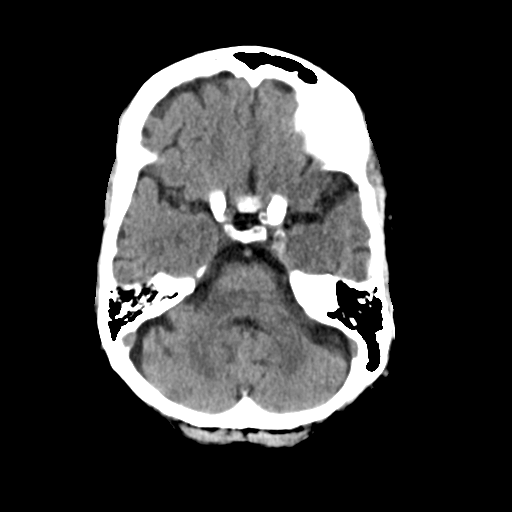
[im 13/33  brain]
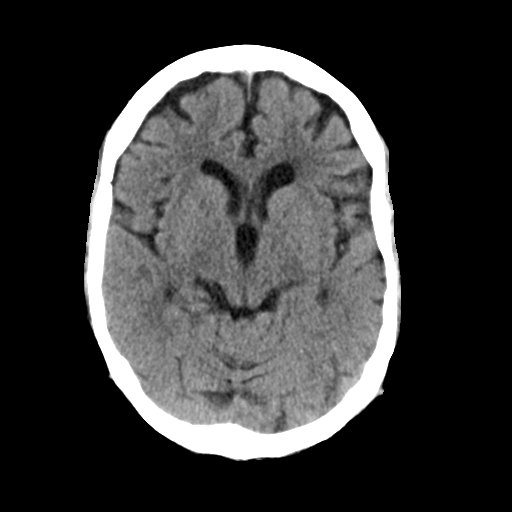
[im 17/33  brain]
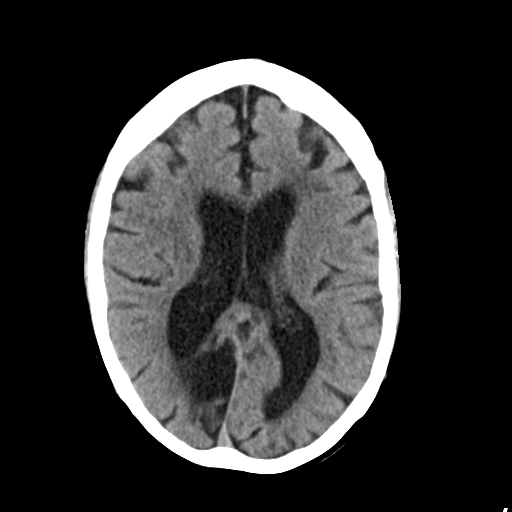
[im 17/33  bone]
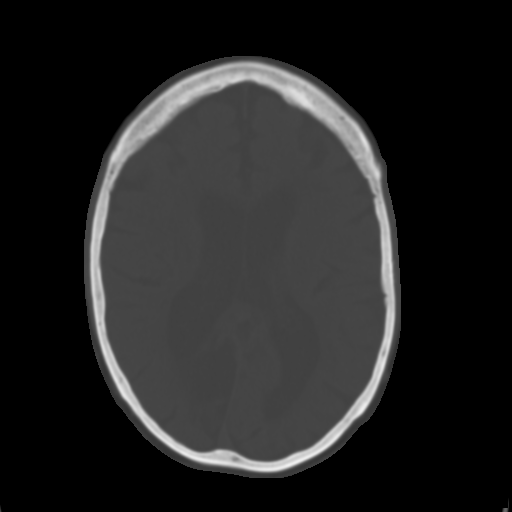
[im 20/33  brain]
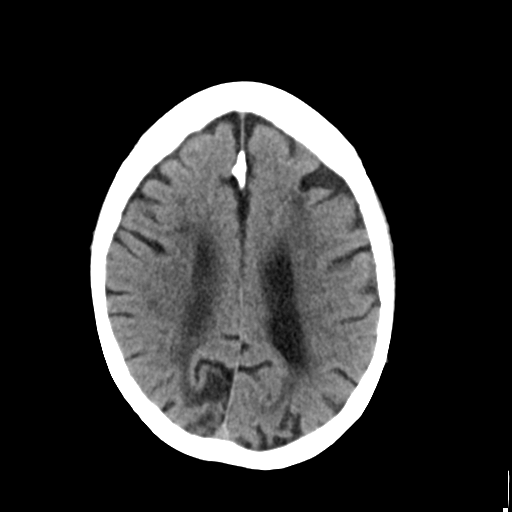
[im 24/33  brain]
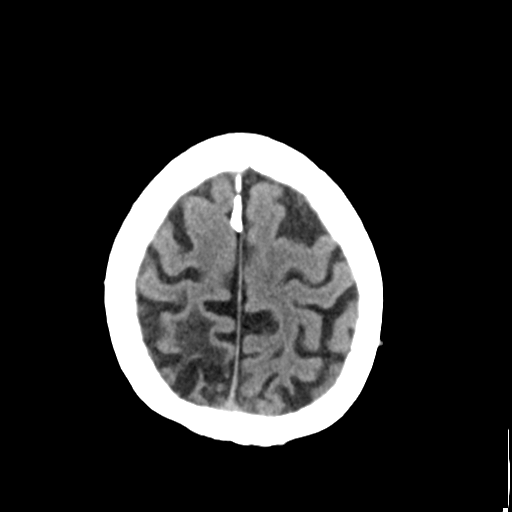
[im 27/33  brain]
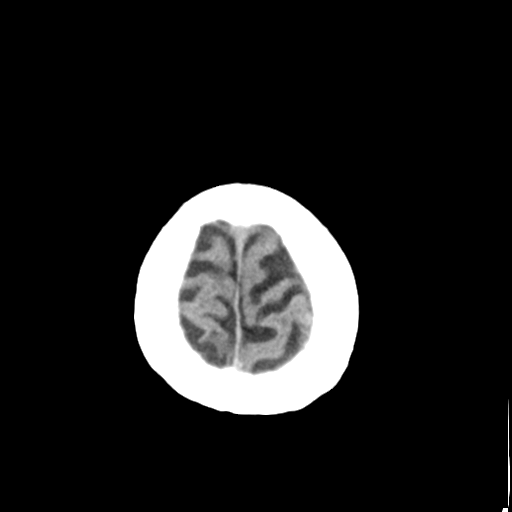
[im 30/33  brain]
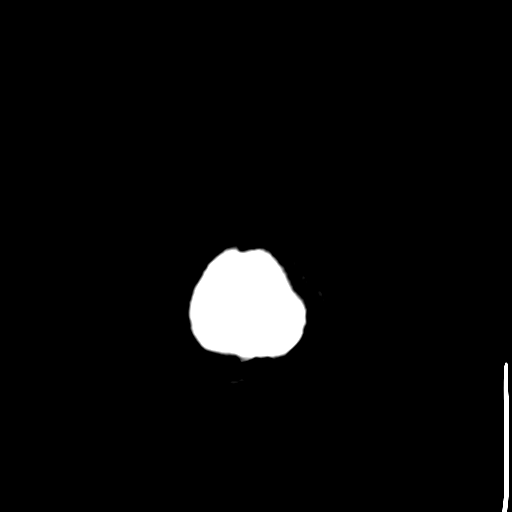
[im 30/33  bone]
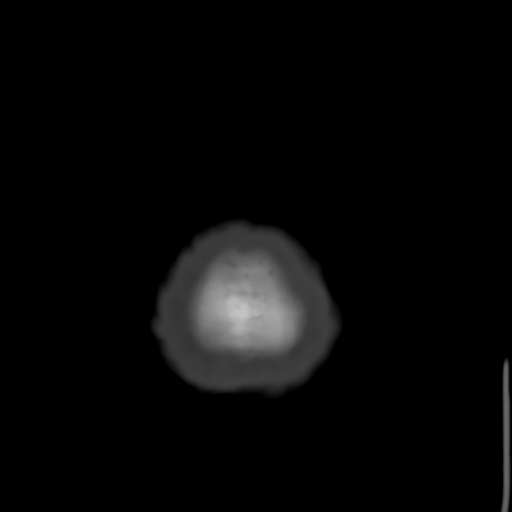

[Series 5: coronal soft tissue · coronal · 0.31mm/px · 3 of 73 slices shown]
[im 25/73  brain]
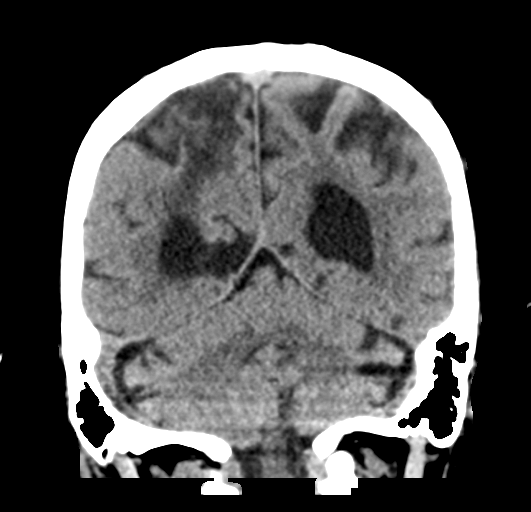
[im 33/73  brain]
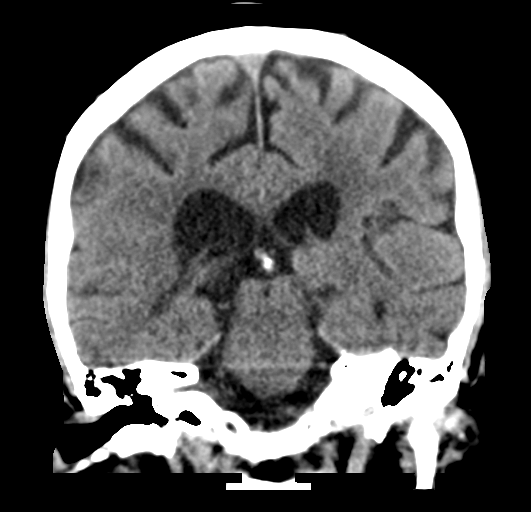
[im 41/73  brain]
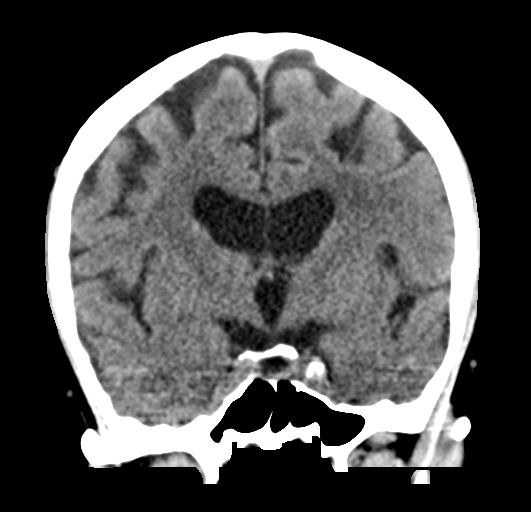

[Series 6: sagittal soft tissue · sagittal · 0.32mm/px · 3 of 55 slices shown]
[im 19/55  brain]
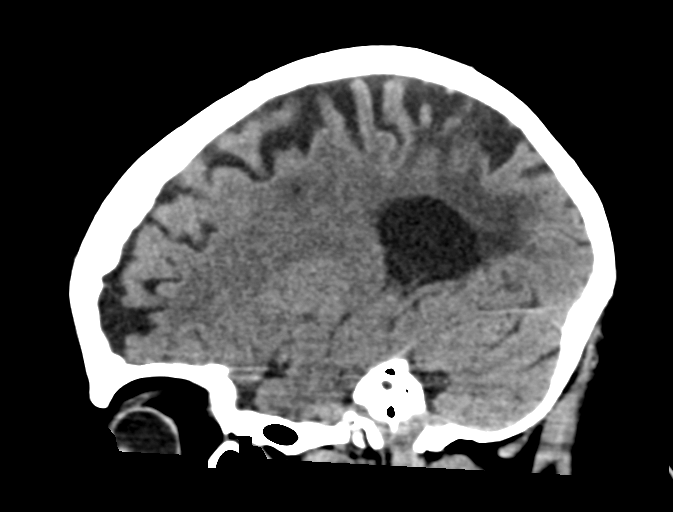
[im 28/55  brain]
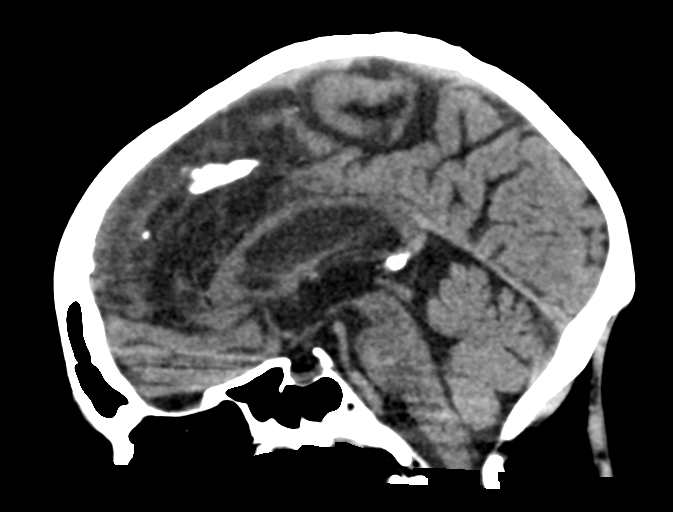
[im 37/55  brain]
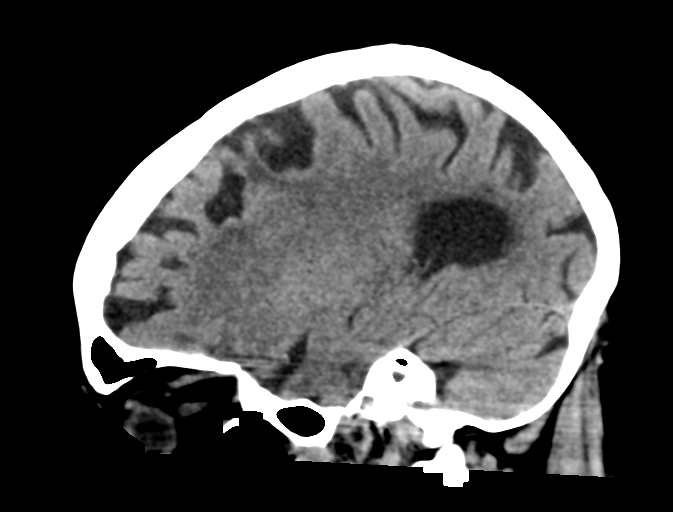

[15 of 47 positions shown; findings below may reference images not displayed]

FINDINGS: Brain: No acute finding by CT. Few old small vessel cerebellar
infarctions. Old infarction in the right occipital lobe, at the
right parietal vertex, and at the left frontal convexity. Chronic
small-vessel ischemic changes of the hemispheric white matter. No
mass, hemorrhage, hydrocephalus or extra-axial collection.

Vascular: There is atherosclerotic calcification of the major
vessels at the base of the brain.

Skull: Negative

Sinuses/Orbits: Clear

Other: None

ASPECTS ([place]berta Stroke Program Early CT Score)

- Ganglionic level infarction (caudate, lentiform nuclei, internal
capsule, insula, M1-M3 cortex): 7, allowing for the old infarction.

- Supraganglionic infarction (M4-M6 cortex): 3, allowing for the old
infarction.

Total score (0-10 with 10 being normal): 10, allowing for the old
infarction.
IMPRESSION: 1. No acute finding by CT. Multiple old infarctions as outlined
above. Chronic small-vessel ischemic changes of the white matter.
2. ASPECTS is 10, allowing for the old infarction.
3. These results were called by telephone at the time of
interpretation on [DATE] at [DATE] to provider Dr. SERIMAH, Who
verbally acknowledged these results.

## 2020-12-26 IMAGING — MR MR MRA NECK W/O CM
1 of 2 series · 1 of 48 positions shown · non-contrast
Comparison: None.

CLINICAL DATA: Acute neurologic deficits.



[Series 1136: bilat carotids h · axial · 0.5mm · 0.43mm/px · 1 of 1 slices shown]
[im 1/1]
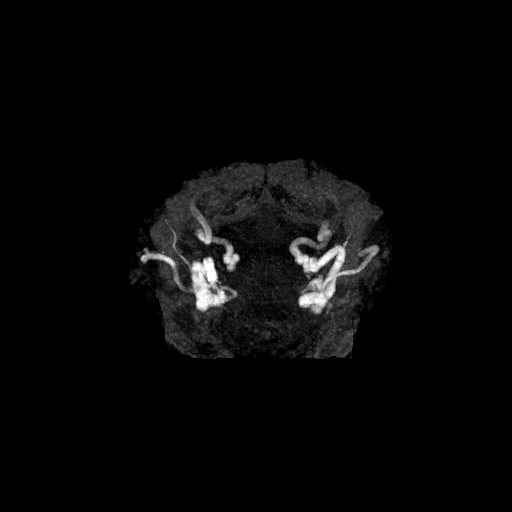

[1 of 48 positions shown; findings below may reference images not displayed]

FINDINGS: MRI HEAD FINDINGS

Brain: Small acute infarct within the anterior right frontal lobe.
There are old infarcts of both cerebellar hemispheres, the left
frontal lobe, the right parietal lobe and the right occipital lobe.
No acute or chronic hemorrhage. There is multifocal hyperintense
T2-weighted signal within the white matter. Generalized volume loss
without a clear lobar predilection. The midline structures are
normal.

Vascular: Major flow voids are preserved.

Skull and upper cervical spine: Normal calvarium and skull base.
Visualized upper cervical spine and soft tissues are normal.

Sinuses/Orbits:No paranasal sinus fluid levels or advanced mucosal
thickening. No mastoid or middle ear effusion. Normal orbits.

MRA HEAD FINDINGS

POSTERIOR CIRCULATION:

--Vertebral arteries: Normal

--Inferior cerebellar arteries: Normal.

--Basilar artery: Normal.

--Superior cerebellar arteries: Normal.

--Posterior cerebral arteries: Distal occlusion of the right PCA is
likely chronic given old PCA territory infarct. Moderate stenosis of
the left P4 segment.

ANTERIOR CIRCULATION:

--Intracranial internal carotid arteries: Normal.

--Anterior cerebral arteries (ACA): Normal.

--Middle cerebral arteries (MCA): Normal.

ANATOMIC VARIANTS: None

MRA NECK FINDINGS

Codominant vertebral arteries are normal along the entirety of the
visualized portion. Time-of-flight imaging poorly images of the V1
and V3 segments. No carotid stenosis.
IMPRESSION: 1. Small acute infarct within the anterior right frontal lobe. No
hemorrhage or mass effect.
2. Multiple old infarcts and chronic small vessel ischemia.
3. Distal occlusion of the right PCA is likely chronic given old PCA
territory infarct.
4. Moderate stenosis of the left P4 segment.
5. Time-of-flight MRA of the neck without hemodynamically
significant stenosis.

## 2020-12-26 IMAGING — MR MR HEAD W/O CM
12 series · 40 of 48 positions shown · non-contrast
Comparison: None.

CLINICAL DATA: Acute neurologic deficits.



[Series 5: ax dwi_tracew · axial · 3.0mm · 0.60mm/px · z∈[-109,+46]mm · 4 of 48 slices shown]
[im 1/48]
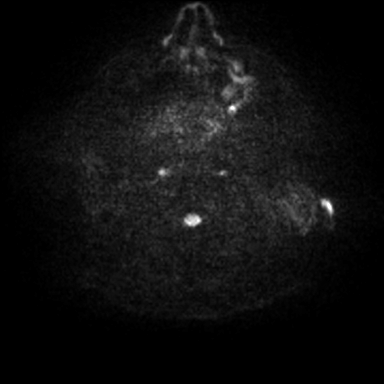
[im 16/48]
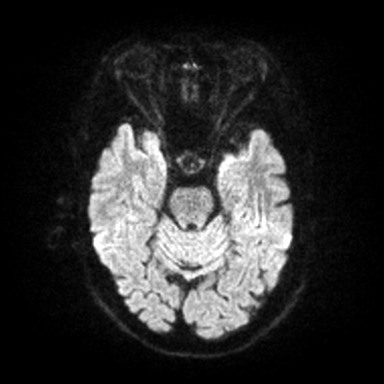
[im 32/48]
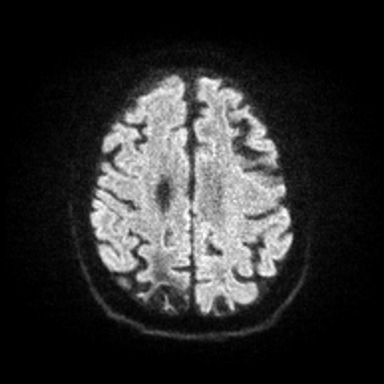
[im 48/48]
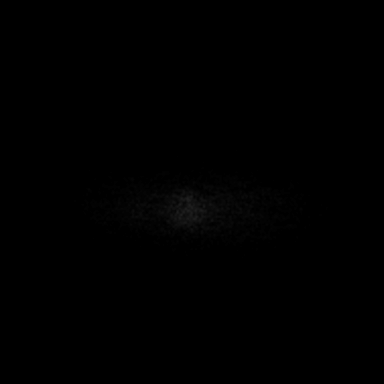

[Series 6: ax dwi_adc · axial · 3.0mm · 0.60mm/px · z∈[-109,+42]mm · 4 of 47 slices shown]
[im 1/47]
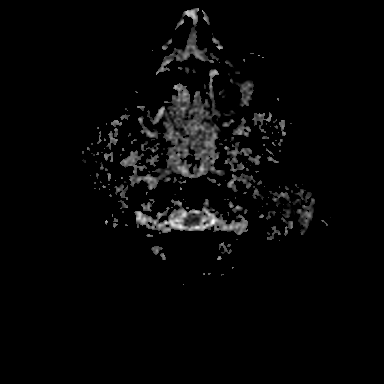
[im 16/47]
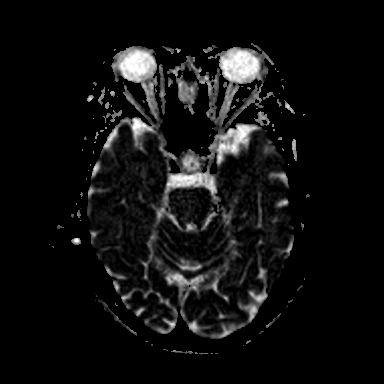
[im 31/47]
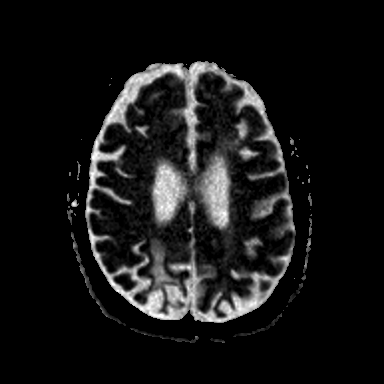
[im 47/47]
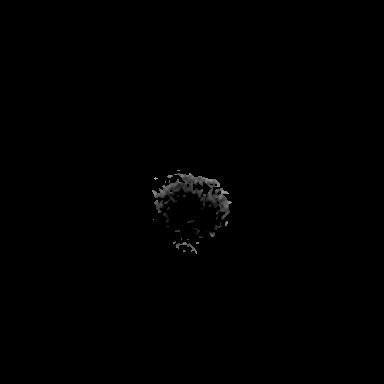

[Series 7: cor dwi_tracew · coronal · 5.0mm · 0.60mm/px · 3 of 40 slices shown]
[im 1/40]
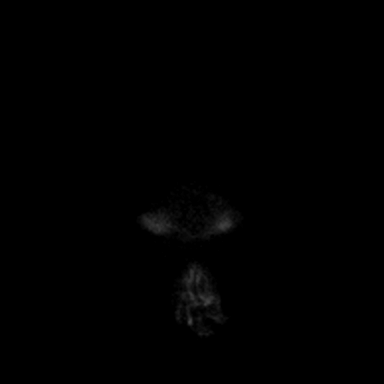
[im 20/40]
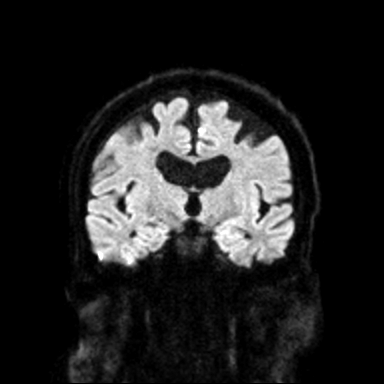
[im 40/40]
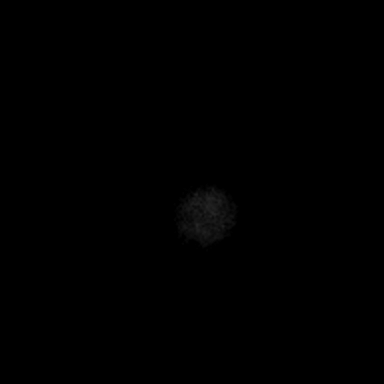

[Series 8: cor dwi_adc · coronal · 5.0mm · 0.60mm/px · 3 of 39 slices shown]
[im 1/39]
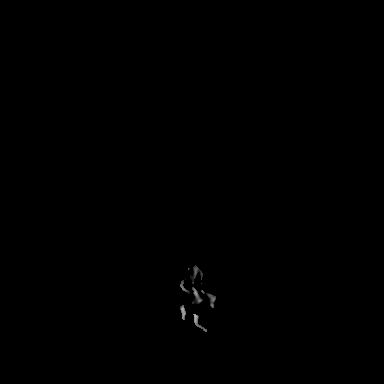
[im 20/39]
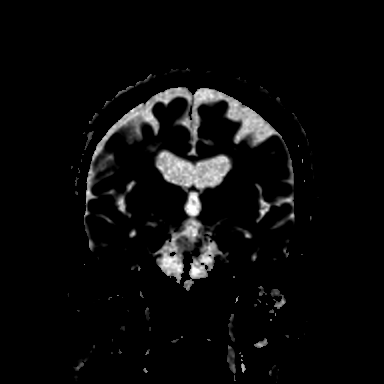
[im 39/39]
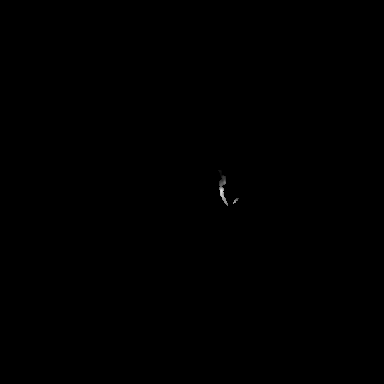

[Series 14: T1 · sagittal · 5.0mm · 0.62mm/px · 2 of 25 slices shown (1 of 2)]
[im 1/25]
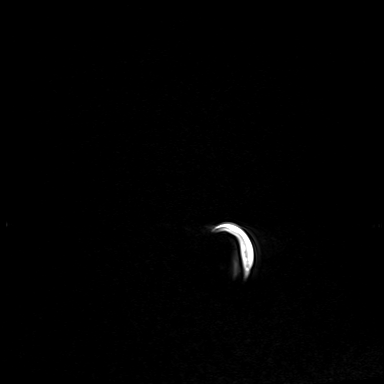
[im 25/25]
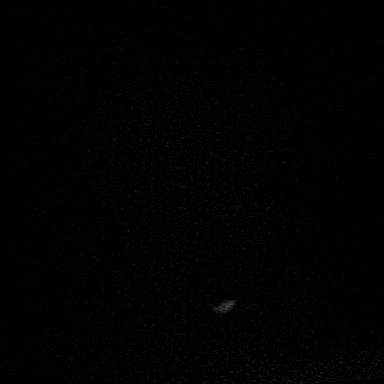

[Series 15: T2 · axial · 5.0mm · 0.53mm/px · z∈[-103,+40]mm · 2 of 25 slices shown (1 of 2)]
[im 1/25]
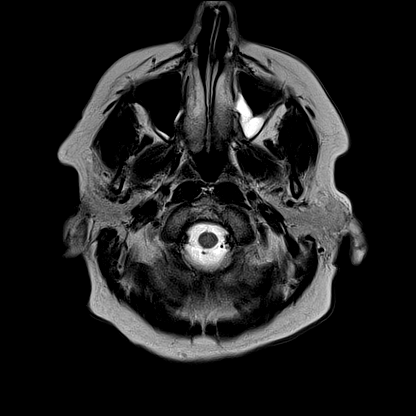
[im 25/25]
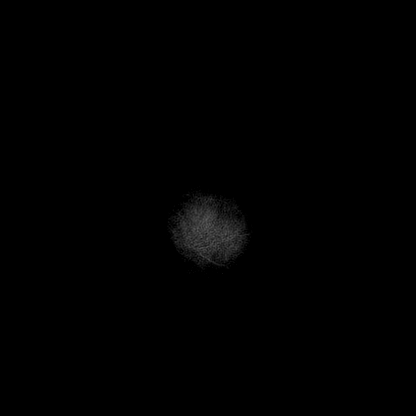

[Series 17: pha_images · axial · 3.0mm · 0.90mm/px · z∈[-120,+57]mm · 4 of 58 slices shown]
[im 1/58]
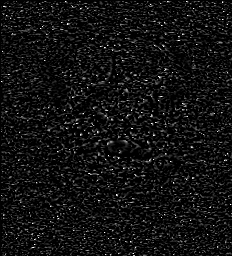
[im 20/58]
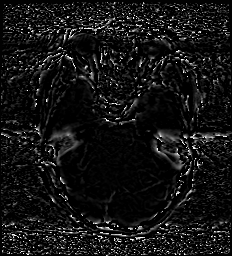
[im 39/58]
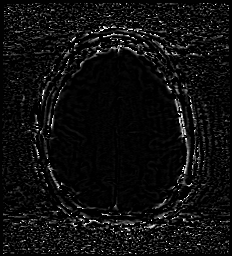
[im 58/58]
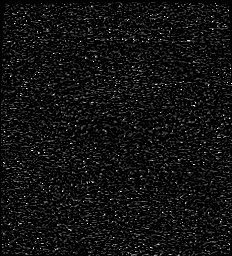

[Series 18: swi_images · axial · 3.0mm · 0.90mm/px · z∈[-120,-78]mm · 2 of 60 slices shown]
[im 1/60]
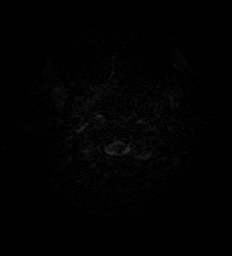
[im 15/60]
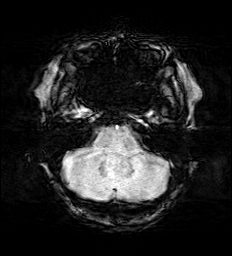

[Series 20: FLAIR · axial · 3.0mm · 0.53mm/px · z∈[-112,+49]mm · 4 of 55 slices shown (1 of 2)]
[im 1/55]
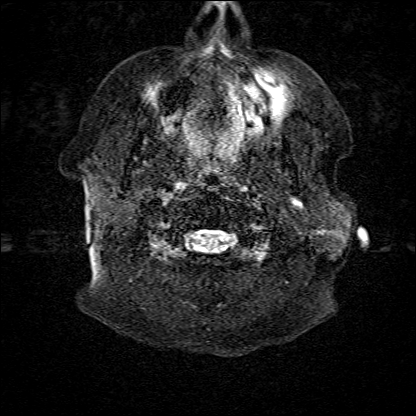
[im 19/55]
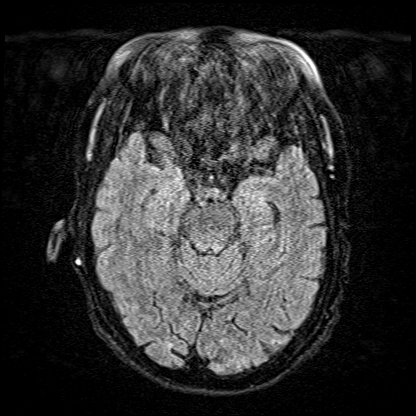
[im 37/55]
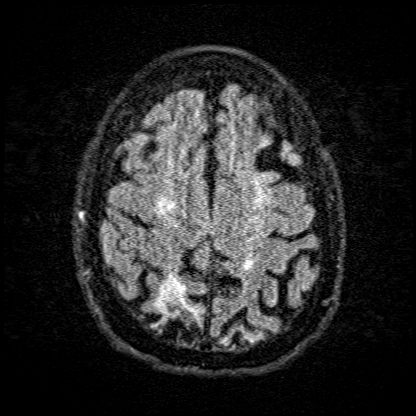
[im 55/55]
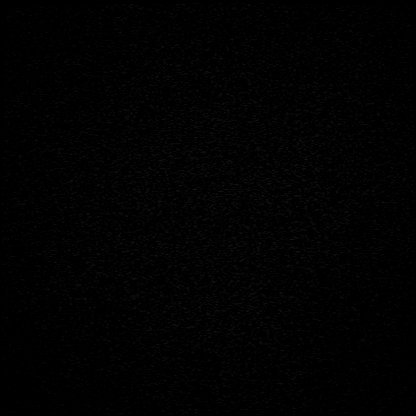

[Series 21: T2 · coronal · 5.0mm · 0.57mm/px · 2 of 29 slices shown (2 of 2)]
[im 1/29]
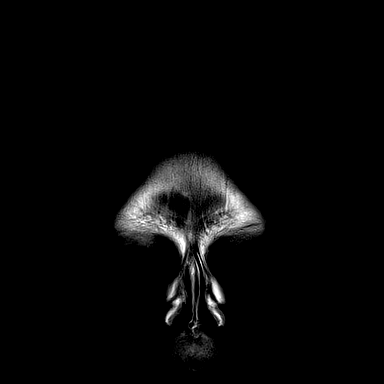
[im 29/29]
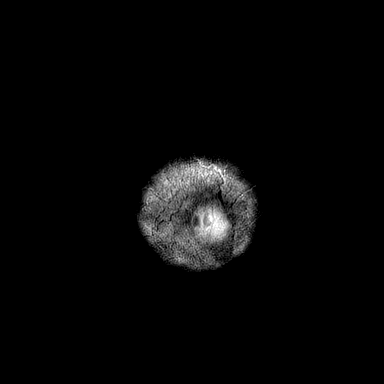

[Series 22: T1 · axial · 1.0mm · 0.98mm/px · z∈[-120,+55]mm · 8 of 176 slices shown (2 of 2)]
[im 1/176]
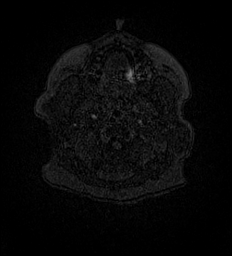
[im 30/176]
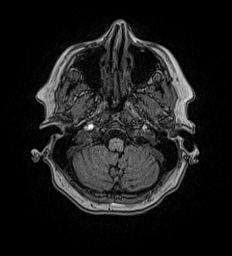
[im 59/176]
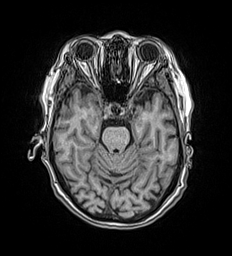
[im 73/176]
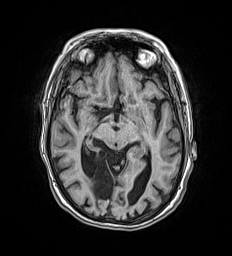
[im 103/176]
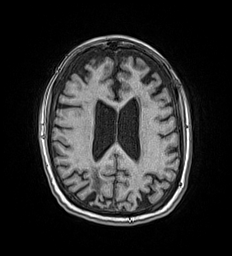
[im 117/176]
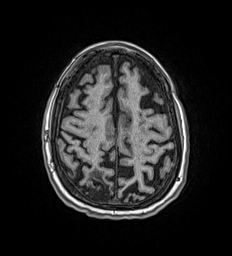
[im 146/176]
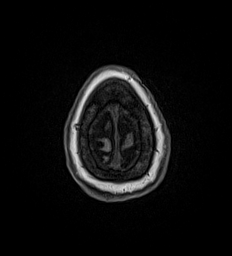
[im 176/176]
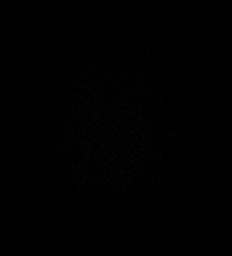

[Series 23: FLAIR · axial · 5.0mm · 1.20mm/px · z∈[-110,+46]mm · 2 of 27 slices shown (2 of 2)]
[im 1/27]
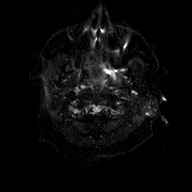
[im 27/27]
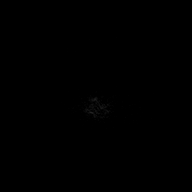

[40 of 48 positions shown; findings below may reference images not displayed]

FINDINGS: MRI HEAD FINDINGS

Brain: Small acute infarct within the anterior right frontal lobe.
There are old infarcts of both cerebellar hemispheres, the left
frontal lobe, the right parietal lobe and the right occipital lobe.
No acute or chronic hemorrhage. There is multifocal hyperintense
T2-weighted signal within the white matter. Generalized volume loss
without a clear lobar predilection. The midline structures are
normal.

Vascular: Major flow voids are preserved.

Skull and upper cervical spine: Normal calvarium and skull base.
Visualized upper cervical spine and soft tissues are normal.

Sinuses/Orbits:No paranasal sinus fluid levels or advanced mucosal
thickening. No mastoid or middle ear effusion. Normal orbits.

MRA HEAD FINDINGS

POSTERIOR CIRCULATION:

--Vertebral arteries: Normal

--Inferior cerebellar arteries: Normal.

--Basilar artery: Normal.

--Superior cerebellar arteries: Normal.

--Posterior cerebral arteries: Distal occlusion of the right PCA is
likely chronic given old PCA territory infarct. Moderate stenosis of
the left P4 segment.

ANTERIOR CIRCULATION:

--Intracranial internal carotid arteries: Normal.

--Anterior cerebral arteries (ACA): Normal.

--Middle cerebral arteries (MCA): Normal.

ANATOMIC VARIANTS: None

MRA NECK FINDINGS

Codominant vertebral arteries are normal along the entirety of the
visualized portion. Time-of-flight imaging poorly images of the V1
and V3 segments. No carotid stenosis.
IMPRESSION: 1. Small acute infarct within the anterior right frontal lobe. No
hemorrhage or mass effect.
2. Multiple old infarcts and chronic small vessel ischemia.
3. Distal occlusion of the right PCA is likely chronic given old PCA
territory infarct.
4. Moderate stenosis of the left P4 segment.
5. Time-of-flight MRA of the neck without hemodynamically
significant stenosis.

## 2020-12-26 NOTE — ED Notes (Signed)
Beather Arbour MD made aware of change in mNIHSS.

## 2020-12-26 NOTE — Progress Notes (Signed)
Spoke with the patient's daughter-Ms. Hillery on the number listed in the chart. She is going to be in a flight from [place] to [place] starting at 9:40 PM and will not be at the hospital till about 1 AM. In the interim, she would like any kind of communication or urgent decision making to be done by the patient's sister-in-law Ms. Mardene Celeste [facility] -who can be reached at phone number [phone]. I have relayed this plan to the ED provider  -- Amie Portland, MD Neurologist [facility] Pager: [phone]

## 2020-12-26 NOTE — ED Provider Notes (Signed)
[facility] Emergency Department Provider Note  ____________________________________________   Event Date/Time   First MD Initiated Contact with Patient 12/26/20 1950     (approximate)  I have reviewed the triage vital signs and the nursing notes.   HISTORY  Chief Complaint Cerebrovascular Accident    HPI [name] is a 70 y.o. female with history of stroke on warfarin who comes in for right leg weakness.  Patient was cooking and at 6 PM was her last known normal.  She states that she had right leg weakness that was severe, constant, nothing made it better, nothing made it worse.  She has had a prior stroke but no prior deficits.  Patient does take Coumadin.  Last dose yesterday.            Past Medical History:  Diagnosis Date  . Arthritis 04/02/1996  . Depression   . GERD (gastroesophageal reflux disease) 04/03/2019  . Hyperlipidemia   . Hypertension   . Renal insufficiency   . Stroke ([place])   . Thyroid disease     Patient Active Problem List   Diagnosis Date Noted  . Antiphospholipid antibody syndrome ([place]) 02/10/2020  . Acute kidney failure ([place]) 11/04/2019  . Edema of lower extremity 11/04/2019  . Stage 3 chronic kidney disease ([place]) 11/04/2019  . Popliteal bursitis of left knee 08/25/2019  . Familial hypercholesterolemia 08/25/2019  . Renal insufficiency 08/25/2019  . Low hemoglobin 08/25/2019  . History of essential hypertension 08/25/2019  . Seasonal allergic rhinitis due to pollen 02/24/2019  . Age-related osteoporosis without current pathological fracture 01/20/2018  . Osteoporosis 11/12/2017  . Urinary incontinence 05/28/2017  . Knee pain 05/08/2017  . Muscle weakness 05/08/2017  . Sprain of MCL (medial collateral ligament) of knee 05/08/2017  . Adult hypothyroidism 10/23/2016  . Essential hypertension 10/23/2016  . Gastroesophageal reflux disease without esophagitis 10/23/2016  . Recurrent major depressive  disorder, in full remission ([place]) 10/23/2016  . Anticoagulant long-term use 10/23/2016    Past Surgical History:  Procedure Laterality Date  . CESAREAN SECTION    . CHOLECYSTECTOMY    . COLONOSCOPY  2012   repeat in 10 yrs  . KNEE ARTHROSCOPY W/ MENISCAL REPAIR Left     Prior to Admission medications   Medication Sig Start Date End Date Taking? Authorizing Provider  atorvastatin (LIPITOR) 10 MG tablet Take 1 tablet (10 mg total) by mouth daily. 12/12/20   Juline Patch, MD  donepezil (ARICEPT) 10 MG tablet Take 1 tablet by mouth daily. 10/31/20 11/04/21  [provider]  escitalopram (LEXAPRO) 20 MG tablet Take 1 tablet (20 mg total) by mouth daily. 12/12/20   Juline Patch, MD  famotidine (PEPCID) 20 MG tablet Take 1 tablet by mouth daily. singh 09/29/19   [provider]  ferrous sulfate 325 (65 FE) MG EC tablet Take 1 tablet (325 mg total) by mouth daily with breakfast. 12/12/20   Juline Patch, MD  levothyroxine (SYNTHROID) 75 MCG tablet Take 1 tablet (75 mcg total) by mouth daily. 12/12/20   Juline Patch, MD  montelukast (SINGULAIR) 10 MG tablet TAKE 1 TABLET BY MOUTH EVERYDAY AT BEDTIME 02/10/20   Juline Patch, MD  Multiple Vitamins-Minerals (CENTRUM SILVER 50+WOMEN PO) Take 1 tablet by mouth daily.    [provider]  mupirocin ointment (BACTROBAN) 2 % Place 1 application into the nose 2 (two) times daily. 04/26/20   Juline Patch, MD  torsemide (DEMADEX) 5 MG tablet Take 1  tablet by mouth daily. singh 01/28/20 12/12/20  [provider]  traZODone (DESYREL) 50 MG tablet Take 1 tablet (50 mg total) by mouth daily. 12/12/20   Juline Patch, MD  vitamin B-12 (CYANOCOBALAMIN) 1000 MCG tablet Take 1,000 mcg by mouth daily.    [provider]  warfarin (COUMADIN) 1 MG tablet TAKE 1 TABLET BY MOUTH EVERY DAY 07/18/20   Juline Patch, MD  warfarin (COUMADIN) 2 MG tablet TAKE 1 TABLET BY MOUTH EVERY DAY 12/05/20   Juline Patch, MD   warfarin (COUMADIN) 3 MG tablet TAKE 1 TABLET BY MOUTH EVERY DAY 09/12/20   Juline Patch, MD    Allergies Penicillin g  Family History  Problem Relation Age of Onset  . Stroke Father   . Breast cancer Maternal Grandmother   . Kidney cancer Mother   . Cancer Mother   . Arthritis Brother     Social History Social History   Tobacco Use  . Smoking status: Never Smoker  . Smokeless tobacco: Never Used  . Tobacco comment: none  Vaping Use  . Vaping Use: Never used  Substance Use Topics  . Alcohol use: Yes    Alcohol/week: 1.0 standard drink    Types: 1 Glasses of wine per week    Comment: occasional drink  . Drug use: No      Review of Systems Constitutional: No fever/chills Eyes: No visual changes. ENT: No sore throat. Cardiovascular: Denies chest pain. Respiratory: Denies shortness of breath. Gastrointestinal: No abdominal pain.  No nausea, no vomiting.  No diarrhea.  No constipation. Genitourinary: Negative for dysuria. Musculoskeletal: Negative for back pain.  Right leg weakness Skin: Negative for rash. Neurological: Negative for headaches, focal weakness or numbness.  Right leg weakness All other ROS negative ____________________________________________   PHYSICAL EXAM:  VITAL SIGNS: ED Triage Vitals  Enc Vitals Group     BP 12/26/20 1945 (!) 151/75     Pulse Rate 12/26/20 1945 77     Resp 12/26/20 1945 20     Temp 12/26/20 1945 98.2 F (36.8 C)     Temp Source 12/26/20 1945 Oral     SpO2 12/26/20 1932 100 %     Weight --      Height --      Head Circumference --      Peak Flow --      Pain Score --      Pain Loc --      Pain Edu? --      Excl. in [place]? --     Constitutional: Alert and oriented. Well appearing and in no acute distress. Eyes: Conjunctivae are normal. EOMI. Head: Atraumatic. Nose: No congestion/rhinnorhea. Mouth/Throat: Mucous membranes are moist.   Neck: No stridor. Trachea Midline. FROM Cardiovascular: Normal rate,  regular rhythm. Grossly normal heart sounds.  Good peripheral circulation. Respiratory: Normal respiratory effort.  No retractions. Lungs CTAB. Gastrointestinal: Soft and nontender. No distention. No abdominal bruits.  Musculoskeletal: No lower extremity tenderness nor edema.  No joint effusions. Neurologic: NIH stroke scale of 1 for right leg weakness.  No other deficits noted Skin:  Skin is warm, dry and intact. No rash noted. Psychiatric: Mood and affect are normal. Speech and behavior are normal. GU: Deferred   ____________________________________________   LABS (all labs ordered are listed, but only abnormal results are displayed)  Labs Reviewed  ETHANOL  PROTIME-INR  APTT  CBC  DIFFERENTIAL  COMPREHENSIVE METABOLIC PANEL  URINE DRUG SCREEN, QUALITATIVE ([place])  URINALYSIS, ROUTINE W REFLEX MICROSCOPIC  CBG MONITORING, ED   ____________________________________________   ED ECG REPORT I, Vanessa [place], the attending physician, personally viewed and interpreted this ECG.  Normal sinus rate of 72, no ST elevation, no T wave inversion, normal intervals ____________________________________________  RADIOLOGY Robert Bellow, personally viewed and evaluated these images (plain radiographs) as part of my medical decision making, as well as reviewing the written report by the radiologist.  ED MD interpretation: No intracranial hemorrhage  Official radiology report(s): CT HEAD CODE STROKE WO CONTRAST  Result Date: 12/26/2020 CLINICAL DATA:  Code stroke.  Right arm and leg weakness EXAM: CT HEAD WITHOUT CONTRAST TECHNIQUE: Contiguous axial images were obtained from the base of the skull through the vertex without intravenous contrast. COMPARISON:  04/23/2012 FINDINGS: Brain: No acute finding by CT. Few old small vessel cerebellar infarctions. Old infarction in the right occipital lobe, at the right parietal vertex, and at the left frontal convexity. Chronic small-vessel  ischemic changes of the hemispheric white matter. No mass, hemorrhage, hydrocephalus or extra-axial collection. Vascular: There is atherosclerotic calcification of the major vessels at the base of the brain. Skull: Negative Sinuses/Orbits: Clear Other: None ASPECTS ([place] Stroke Program Early CT Score) - Ganglionic level infarction (caudate, lentiform nuclei, internal capsule, insula, M1-M3 cortex): 7, allowing for the old infarction. - Supraganglionic infarction (M4-M6 cortex): 3, allowing for the old infarction. Total score (0-10 with 10 being normal): 10, allowing for the old infarction. IMPRESSION: 1. No acute finding by CT. 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 Chronic small-vessel ischemic changes of the white matter. 2. ASPECTS is 10, allowing for the old infarction. 3. These results were called by telephone at the time of interpretation on 12/26/2020 at 8:07 pm to provider Dr. Nickolas Madrid, Who verbally acknowledged these results. Electronically Signed   By: Nelson Chimes M.D.   On: 12/26/2020 20:09    ____________________________________________   PROCEDURES  Procedure(s) performed (including Critical Care):  Procedures   ____________________________________________   INITIAL IMPRESSION / ASSESSMENT AND PLAN / ED COURSE  Kashawn Dozer Riebe was evaluated in Emergency Department on 12/26/2020 for the symptoms described in the history of present illness. She was evaluated in the context of the global COVID-19 pandemic, which necessitated consideration that the patient might be at risk for infection with the SARS-CoV-2 virus that causes COVID-19. Institutional protocols and algorithms that pertain to the evaluation of patients at risk for COVID-19 are in a state of rapid change based on information released by regulatory bodies including the CDC and federal and state organizations. These policies and algorithms were followed during the patient's care in the ED.     Is a 70 year old who comes  in for right leg weakness.  This concerning for the possibility of stroke.  CT head ordered evaluate for intracranial hemorrhage.  Denies any chest pain to suggest dissection.  Her sugar was normal so not secondary to hypoglycemia.  Will get labs to evaluate for electrolyte abnormalities, AKI.  Code stroke was called from triage.   D/W Dr. Rory Percy NIH stroke scale is 1.  INR level came back subtherapeutic however neurologist had a full discussion with the family and have opted to hold off on TPA given her low stroke scale.  If she has any changes in her examination prior to 1030 we should repeat call out a stroke and discuss with neurology about TPA.  I reevaluated patient around 9:15 PM and her weakness in her right leg was actually improving.  She was able to hold the leg up for 5 seconds.  Neurology wanted to get an MRI/MRA and if negative they were okay with sending patient home if positive they wanted patient to be admitted.  ____________________________________________   FINAL CLINICAL IMPRESSION(S) / ED DIAGNOSES   Final diagnoses:  Right leg weakness      MEDICATIONS GIVEN DURING THIS VISIT:  Medications - No data to display   ED Discharge Orders    None       Note:  This document was prepared using Dragon voice recognition software and may include unintentional dictation errors.   Vanessa Sturgeon, MD 12/26/20 2132

## 2020-12-26 NOTE — ED Triage Notes (Signed)
Pt with acute onset of CVA at 1800 today, pt unable to hold right leg in place and has right arm deviation. Pt has hx of stroke with no residual weakness from the same.

## 2020-12-26 NOTE — Progress Notes (Signed)
   12/26/20 2041  Clinical Encounter Type  Visited With Patient  Visit Type Initial;Spiritual support  Referral From Nurse   This chaplain responded to a Code Stroke page for the patient. Upon arrival, the patient was observed to be awake, alert, oriented in the hospital bed. She shared that she is not experiencing any pain at the moment, but she did not feel like herself. She also shared that she had not eaten in a few days because she lacked an appetite. The patient reported that tonight, she was preparing dinner and had planned to address this when she experienced the symptoms that brought her to the ED. The patient also has concern for her husband and she requested access to a phone to call and check on him; request relayed to her nurse who immediately provided a phone for her use. The patient also shared that her daughter, who lives in [place], is one the way here to check on her mother. No additional needs at this time. Will continue to follow.  Gennaro Africa, Chaplain

## 2020-12-26 NOTE — H&P (Signed)
History and Physical    [name] [id number] DOB: 01/21/1951 DOA: 12/26/2020  PCP: Juline Patch, MD  Patient coming from: Home via EMS  I have personally briefly reviewed patient's old medical records in [place]  Chief Complaint: Right-sided weakness  HPI: [name] is a 70 y.o. female with medical history significant for history of CVA with residual left lateral visual field deficit, history of DVT and antiphospholipid antibody syndrome on Coumadin, CKD stage IIIb, hypertension, hyperlipidemia, hypothyroidism, depression, and mild cognitive impairment who presents to the ED for evaluation of right-sided weakness.  Patient states he was in her usual state of health earlier 12/26/2020 when she developed sudden onset of right-sided weakness while she was cooking.  Weakness was mostly in her right leg.  She went to sit down and then when she tried to stand up she was unable to.  She had to crawl on the floor to get to her husband who activated her life alert.  EMS were called and she was brought to the ED for further evaluation.  Patient denies any sensory change.  She denies any new visual field change and reports chronic left lateral visual field deficit from prior stroke.  She denies any new headache, chest pain, palpitations, dyspnea, cough, nausea, vomiting, abdominal pain, dysuria.  She is on Coumadin and denies any obvious bleeding.  ED Course:  Initial vitals showed BP 151/75, pulse 77, RR 20, temp 98.2 F, SPO2 100% on room air.  Labs significant for WBC 6.3, hemoglobin 10.6, platelets 86,000 (previously 241,000 on 09/19/2020), sodium 138, potassium 3.9, bicarb 24, BUN 30, creatinine 1.68, serum glucose 102, LFTs within normal limits, INR 1.2, serum ethanol <10.  Urinalysis negative for UTI.  UDS negative.  SARS-CoV-2 PCR negative.  Patient arrived as a code stroke.  Initial CT head without contrast was negative for acute finding.  Multiple old  infarctions were noted.  Teleneurology were consulted and after discussion with family the decision was made not to proceed with TPA.  Further imaging studies were ordered.  MRI brain, MRA head and neck were performed.  A small acute infarct within the anterior right frontal lobe was seen without hemorrhage or mass-effect.  Multiple old infarcts and chronic small vessel ischemia seen.  Distal occlusion of the right PCA noted and felt to be chronic given old PCA territory infarct.  Moderate stenosis of the left V4 segment also seen.  The hospitalist service was consulted to admit for further evaluation and management.  Review of Systems: All systems reviewed and are negative except as documented in history of present illness above.   Past Medical History:  Diagnosis Date   Arthritis 04/02/1996   Depression    GERD (gastroesophageal reflux disease) 04/03/2019   Hyperlipidemia    Hypertension    Renal insufficiency    Stroke [facility])    Thyroid disease     Past Surgical History:  Procedure Laterality Date   CESAREAN SECTION     CHOLECYSTECTOMY     COLONOSCOPY  2012   repeat in 10 yrs   KNEE ARTHROSCOPY W/ MENISCAL REPAIR Left     Social History:  reports that she has never smoked. She has never used smokeless tobacco. She reports current alcohol use of about 1.0 standard drink of alcohol per week. She reports that she does not use drugs.  Allergies  Allergen Reactions   Penicillin G Rash    Family History  Problem Relation Age of Onset   Stroke  Father    Breast cancer Maternal Grandmother    Kidney cancer Mother    Cancer Mother    Arthritis Brother      Prior to Admission medications   Medication Sig Start Date End Date Taking? Authorizing Provider  atorvastatin (LIPITOR) 10 MG tablet Take 1 tablet (10 mg total) by mouth daily. 12/12/20   Juline Patch, MD  donepezil (ARICEPT) 10 MG tablet Take 1 tablet by mouth daily. 10/31/20 11/04/21  [provider]  escitalopram (LEXAPRO) 20 MG tablet Take 1 tablet (20 mg total) by mouth daily. 12/12/20   Juline Patch, MD  famotidine (PEPCID) 20 MG tablet Take 1 tablet by mouth daily. singh 09/29/19   [provider]  ferrous sulfate 325 (65 FE) MG EC tablet Take 1 tablet (325 mg total) by mouth daily with breakfast. 12/12/20   Juline Patch, MD  levothyroxine (SYNTHROID) 75 MCG tablet Take 1 tablet (75 mcg total) by mouth daily. 12/12/20   Juline Patch, MD  montelukast (SINGULAIR) 10 MG tablet TAKE 1 TABLET BY MOUTH EVERYDAY AT BEDTIME 02/10/20   Juline Patch, MD  Multiple Vitamins-Minerals (CENTRUM SILVER 50+WOMEN PO) Take 1 tablet by mouth daily.    [provider]  mupirocin ointment (BACTROBAN) 2 % Place 1 application into the nose 2 (two) times daily. 04/26/20   Juline Patch, MD  torsemide (DEMADEX) 5 MG tablet Take 1 tablet by mouth daily. singh 01/28/20 12/12/20  [provider]  traZODone (DESYREL) 50 MG tablet Take 1 tablet (50 mg total) by mouth daily. 12/12/20   Juline Patch, MD  vitamin B-12 (CYANOCOBALAMIN) 1000 MCG tablet Take 1,000 mcg by mouth daily.    [provider]  warfarin (COUMADIN) 1 MG tablet TAKE 1 TABLET BY MOUTH EVERY DAY 07/18/20   Juline Patch, MD  warfarin (COUMADIN) 2 MG tablet TAKE 1 TABLET BY MOUTH EVERY DAY 12/05/20   Juline Patch, MD  warfarin (COUMADIN) 3 MG tablet TAKE 1 TABLET BY MOUTH EVERY DAY 09/12/20   Juline Patch, MD    Physical Exam: Vitals:   12/26/20 2130 12/26/20 2145 12/26/20 2300 12/26/20 2315  BP: 118/87     Pulse: 80 76    Resp: (!) 22  15 15   Temp:      TempSrc:      SpO2: 98% 98%     Constitutional: Resting in bed, NAD, calm, comfortable Eyes: PERRL, EOMI intact but does not track to left lateral visual field due to chronic impairment.  Lids and conjunctivae normal ENMT: Mucous membranes are moist. Posterior pharynx clear of any exudate or lesions.Normal dentition.  Neck:  normal, supple, no masses. Respiratory: clear to auscultation bilaterally, no wheezing, no crackles. Normal respiratory effort. No accessory muscle use.  Cardiovascular: Regular rate and rhythm, no murmurs / rubs / gallops. No extremity edema. 2+ pedal pulses. Abdomen: no tenderness, no masses palpated. No hepatosplenomegaly. Bowel sounds positive.  Musculoskeletal: no clubbing / cyanosis. No joint deformity upper and lower extremities. ROM of RLE diminished, no contractures. Normal muscle tone.  Skin: no rashes, lesions, ulcers. No induration Neurologic: CN 2-12 grossly intact. Sensation intact. Strength 1-2/5 RLE, 4/5 RUE, 5/5 left upper and lower extremities.  FTN intact. Psychiatric: Normal judgment and insight. Alert and oriented x 3. Normal mood.   Labs on Admission: I have personally reviewed following labs and imaging studies  CBC: Recent Labs  Lab 12/26/20 1953  WBC 6.3  NEUTROABS 4.6  HGB 10.6*  HCT 31.9*  MCV 99.4  PLT 86*   Basic Metabolic Panel: Recent Labs  Lab 12/26/20 1953  NA 138  K 3.9  CL 102  CO2 24  GLUCOSE 102*  BUN 30*  CREATININE 1.68*  CALCIUM 9.6   GFR: CrCl cannot be calculated (Unknown ideal weight.). Liver Function Tests: Recent Labs  Lab 12/26/20 1953  AST 22  ALT 16  ALKPHOS 65  BILITOT 1.0  PROT 7.2  ALBUMIN 4.0   No results for input(s): LIPASE, AMYLASE in the last 168 hours. No results for input(s): AMMONIA in the last 168 hours. Coagulation Profile: Recent Labs  Lab 12/26/20 1953  INR 1.2   Cardiac Enzymes: No results for input(s): CKTOTAL, CKMB, CKMBINDEX, TROPONINI in the last 168 hours. BNP (last 3 results) No results for input(s): PROBNP in the last 8760 hours. HbA1C: No results for input(s): HGBA1C in the last 72 hours. CBG: Recent Labs  Lab 12/26/20 1945  GLUCAP 89   Lipid Profile: No results for input(s): CHOL, HDL, LDLCALC, TRIG, CHOLHDL, LDLDIRECT in the last 72 hours. Thyroid Function Tests: No results  for input(s): TSH, T4TOTAL, FREET4, T3FREE, THYROIDAB in the last 72 hours. Anemia Panel: No results for input(s): VITAMINB12, FOLATE, FERRITIN, TIBC, IRON, RETICCTPCT in the last 72 hours. Urine analysis:    Component Value Date/Time   COLORURINE STRAW (A) 12/26/2020 2232   APPEARANCEUR CLEAR (A) 12/26/2020 2232   APPEARANCEUR Clear 04/23/2012 2341   LABSPEC 1.004 (L) 12/26/2020 2232   LABSPEC 1.021 04/23/2012 2341   PHURINE 7.0 12/26/2020 2232   GLUCOSEU NEGATIVE 12/26/2020 2232   GLUCOSEU Negative 04/23/2012 2341   HGBUR NEGATIVE 12/26/2020 2232   BILIRUBINUR NEGATIVE 12/26/2020 2232   BILIRUBINUR negative 10/15/2017 1622   BILIRUBINUR Negative 04/23/2012 2341   KETONESUR NEGATIVE 12/26/2020 2232   PROTEINUR NEGATIVE 12/26/2020 2232   UROBILINOGEN 0.2 10/15/2017 1622   NITRITE NEGATIVE 12/26/2020 2232   LEUKOCYTESUR NEGATIVE 12/26/2020 2232   LEUKOCYTESUR Negative 04/23/2012 2341    Radiological Exams on Admission: MR ANGIO HEAD WO CONTRAST  Result Date: 12/26/2020 CLINICAL DATA:  Acute neurologic deficits. EXAM: MRI HEAD WITHOUT CONTRAST MRA HEAD WITHOUT CONTRAST MRA NECK WITHOUT CONTRAST TECHNIQUE: Multiplanar, multiecho pulse sequences of the brain and surrounding structures were obtained without intravenous contrast. Angiographic images of the Circle of Willis were obtained using MRA technique without intravenous contrast. Angiographic images of the neck were obtained using MRA technique without intravenous contrast. Carotid stenosis measurements (when applicable) are obtained utilizing NASCET criteria, using the distal internal carotid diameter as the denominator. COMPARISON:  None. FINDINGS: MRI HEAD FINDINGS Brain: Small acute infarct within the anterior right frontal lobe. There are old infarcts of both cerebellar hemispheres, the left frontal lobe, the right parietal lobe and the right occipital lobe. No acute or chronic hemorrhage. There is multifocal hyperintense  T2-weighted signal within the white matter. Generalized volume loss without a clear lobar predilection. The midline structures are normal. Vascular: Major flow voids are preserved. Skull and upper cervical spine: Normal calvarium and skull base. Visualized upper cervical spine and soft tissues are normal. Sinuses/Orbits:No paranasal sinus fluid levels or advanced mucosal thickening. No mastoid or middle ear effusion. Normal orbits. MRA HEAD FINDINGS POSTERIOR CIRCULATION: --Vertebral arteries: Normal --Inferior cerebellar arteries: Normal. --Basilar artery: Normal. --Superior cerebellar arteries: Normal. --Posterior cerebral arteries: Distal occlusion of the right PCA is likely chronic given old PCA territory infarct. Moderate stenosis of the left P4 segment. ANTERIOR CIRCULATION: --Intracranial internal carotid arteries: Normal. --Anterior  cerebral arteries (ACA): Normal. --Middle cerebral arteries (MCA): Normal. ANATOMIC VARIANTS: None MRA NECK FINDINGS Codominant vertebral arteries are normal along the entirety of the visualized portion. Time-of-flight imaging poorly images of the V1 and V3 segments. No carotid stenosis. IMPRESSION: 1. Small acute infarct within the anterior right frontal lobe. No hemorrhage or mass effect. 2. Multiple old infarcts and chronic small vessel ischemia. 3. Distal occlusion of the right PCA is likely chronic given old PCA territory infarct. 4. Moderate stenosis of the left P4 segment. 5. Time-of-flight MRA of the neck without hemodynamically significant stenosis. Electronically Signed   By: Ulyses Jarred M.D.   On: 12/26/2020 23:25   MR ANGIO NECK WO CONTRAST  Result Date: 12/26/2020 CLINICAL DATA:  Acute neurologic deficits. EXAM: MRI HEAD WITHOUT CONTRAST MRA HEAD WITHOUT CONTRAST MRA NECK WITHOUT CONTRAST TECHNIQUE: Multiplanar, multiecho pulse sequences of the brain and surrounding structures were obtained without intravenous contrast. Angiographic images of the Circle of  Willis were obtained using MRA technique without intravenous contrast. Angiographic images of the neck were obtained using MRA technique without intravenous contrast. Carotid stenosis measurements (when applicable) are obtained utilizing NASCET criteria, using the distal internal carotid diameter as the denominator. COMPARISON:  None. FINDINGS: MRI HEAD FINDINGS Brain: Small acute infarct within the anterior right frontal lobe. There are old infarcts of both cerebellar hemispheres, the left frontal lobe, the right parietal lobe and the right occipital lobe. No acute or chronic hemorrhage. There is multifocal hyperintense T2-weighted signal within the white matter. Generalized volume loss without a clear lobar predilection. The midline structures are normal. Vascular: Major flow voids are preserved. Skull and upper cervical spine: Normal calvarium and skull base. Visualized upper cervical spine and soft tissues are normal. Sinuses/Orbits:No paranasal sinus fluid levels or advanced mucosal thickening. No mastoid or middle ear effusion. Normal orbits. MRA HEAD FINDINGS POSTERIOR CIRCULATION: --Vertebral arteries: Normal --Inferior cerebellar arteries: Normal. --Basilar artery: Normal. --Superior cerebellar arteries: Normal. --Posterior cerebral arteries: Distal occlusion of the right PCA is likely chronic given old PCA territory infarct. Moderate stenosis of the left P4 segment. ANTERIOR CIRCULATION: --Intracranial internal carotid arteries: Normal. --Anterior cerebral arteries (ACA): Normal. --Middle cerebral arteries (MCA): Normal. ANATOMIC VARIANTS: None MRA NECK FINDINGS Codominant vertebral arteries are normal along the entirety of the visualized portion. Time-of-flight imaging poorly images of the V1 and V3 segments. No carotid stenosis. IMPRESSION: 1. Small acute infarct within the anterior right frontal lobe. No hemorrhage or mass effect. 2. Multiple old infarcts and chronic small vessel ischemia. 3. Distal  occlusion of the right PCA is likely chronic given old PCA territory infarct. 4. Moderate stenosis of the left P4 segment. 5. Time-of-flight MRA of the neck without hemodynamically significant stenosis. Electronically Signed   By: Ulyses Jarred M.D.   On: 12/26/2020 23:25   MR BRAIN WO CONTRAST  Result Date: 12/26/2020 CLINICAL DATA:  Acute neurologic deficits. EXAM: MRI HEAD WITHOUT CONTRAST MRA HEAD WITHOUT CONTRAST MRA NECK WITHOUT CONTRAST TECHNIQUE: Multiplanar, multiecho pulse sequences of the brain and surrounding structures were obtained without intravenous contrast. Angiographic images of the Circle of Willis were obtained using MRA technique without intravenous contrast. Angiographic images of the neck were obtained using MRA technique without intravenous contrast. Carotid stenosis measurements (when applicable) are obtained utilizing NASCET criteria, using the distal internal carotid diameter as the denominator. COMPARISON:  None. FINDINGS: MRI HEAD FINDINGS Brain: Small acute infarct within the anterior right frontal lobe. There are old infarcts of both cerebellar hemispheres, the left frontal lobe, the  right parietal lobe and the right occipital lobe. No acute or chronic hemorrhage. There is multifocal hyperintense T2-weighted signal within the white matter. Generalized volume loss without a clear lobar predilection. The midline structures are normal. Vascular: Major flow voids are preserved. Skull and upper cervical spine: Normal calvarium and skull base. Visualized upper cervical spine and soft tissues are normal. Sinuses/Orbits:No paranasal sinus fluid levels or advanced mucosal thickening. No mastoid or middle ear effusion. Normal orbits. MRA HEAD FINDINGS POSTERIOR CIRCULATION: --Vertebral arteries: Normal --Inferior cerebellar arteries: Normal. --Basilar artery: Normal. --Superior cerebellar arteries: Normal. --Posterior cerebral arteries: Distal occlusion of the right PCA is likely chronic  given old PCA territory infarct. Moderate stenosis of the left P4 segment. ANTERIOR CIRCULATION: --Intracranial internal carotid arteries: Normal. --Anterior cerebral arteries (ACA): Normal. --Middle cerebral arteries (MCA): Normal. ANATOMIC VARIANTS: None MRA NECK FINDINGS Codominant vertebral arteries are normal along the entirety of the visualized portion. Time-of-flight imaging poorly images of the V1 and V3 segments. No carotid stenosis. IMPRESSION: 1. Small acute infarct within the anterior right frontal lobe. No hemorrhage or mass effect. 2. Multiple old infarcts and chronic small vessel ischemia. 3. Distal occlusion of the right PCA is likely chronic given old PCA territory infarct. 4. Moderate stenosis of the left P4 segment. 5. Time-of-flight MRA of the neck without hemodynamically significant stenosis. Electronically Signed   By: Ulyses Jarred M.D.   On: 12/26/2020 23:25   CT HEAD CODE STROKE WO CONTRAST  Result Date: 12/26/2020 CLINICAL DATA:  Code stroke.  Right arm and leg weakness EXAM: CT HEAD WITHOUT CONTRAST TECHNIQUE: Contiguous axial images were obtained from the base of the skull through the vertex without intravenous contrast. COMPARISON:  04/23/2012 FINDINGS: Brain: No acute finding by CT. Few old small vessel cerebellar infarctions. Old infarction in the right occipital lobe, at the right parietal vertex, and at the left frontal convexity. Chronic small-vessel ischemic changes of the hemispheric white matter. No mass, hemorrhage, hydrocephalus or extra-axial collection. Vascular: There is atherosclerotic calcification of the major vessels at the base of the brain. Skull: Negative Sinuses/Orbits: Clear Other: None ASPECTS ([place] Stroke Program Early CT Score) - Ganglionic level infarction (caudate, lentiform nuclei, internal capsule, insula, M1-M3 cortex): 7, allowing for the old infarction. - Supraganglionic infarction (M4-M6 cortex): 3, allowing for the old infarction. Total score  (0-10 with 10 being normal): 10, allowing for the old infarction. IMPRESSION: 1. No acute finding by CT. 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 Chronic small-vessel ischemic changes of the white matter. 2. ASPECTS is 10, allowing for the old infarction. 3. These results were called by telephone at the time of interpretation on 12/26/2020 at 8:07 pm to provider Dr. Nickolas Madrid, Who verbally acknowledged these results. Electronically Signed   By: Nelson Chimes M.D.   On: 12/26/2020 20:09    EKG: Personally reviewed. Normal sinus rhythm without acute ischemic changes.  Assessment/Plan Principal Problem:   Acute CVA (cerebrovascular accident) [facility]) Active Problems:   Adult hypothyroidism   Essential hypertension   Anticoagulant long-term use   Stage 3 chronic kidney disease (HCC)   Thrombocytopenia (HCC)  [name] is a 70 y.o. female with medical history significant for history of CVA with residual left lateral visual field deficit, history of DVT and antiphospholipid antibody syndrome on Coumadin, CKD stage IIIb, hypertension, hyperlipidemia, hypothyroidism, depression, and mild cognitive impairment who is admitted with acute stroke.  Acute CVA with history of stroke: Small acute infarct within the anterior right frontal lobe seen on MRI imaging.  Teleneurology following and have deferred TPA after discussion with family.   -Obtain echocardiogram -Start aspirin 81 mg daily, monitor platelets -Hold home Coumadin for now per neurology -Check A1c, lipid panel -Continue neurochecks, telemetry -Allow permissive hypertension for now -PT/OT/SLP eval -Continue atorvastatin  Thrombocytopenia: New finding without obvious bleeding.  Continue to monitor.  History of DVT and antiphospholipid antibody syndrome on Coumadin: INR subtherapeutic at 1.2 on arrival.  Coumadin on hold per neurology.  Hypertension: Allow permissive hypertension for now.  CKD stage IIIb: Creatinine slightly  elevated from recent baseline.  Placed on IV NS overnight and monitor.  Hypothyroidism: Continue Synthroid.  Hyperlipidemia: Continue atorvastatin.  Depression: Continue Lexapro.  Mild cognitive impairment: Continue Aricept.  DVT prophylaxis: SCDs given thrombocytopenia  Code Status: Full Code, confirmed with patient Family Communication: Discussed with patient Disposition Plan: From home, dispo pending further CVA work-up and PT/OT/SLP eval Consults called: Teleneurology Level of care: Med-Surg Admission status:  Status is: Observation  The patient remains OBS appropriate and will d/c before 2 midnights.  Dispo: The patient is from: Home              Anticipated d/c is to: Home              Anticipated d/c date is: 1 day              Patient currently is not medically stable to d/c.    Zada Finders MD [facility]  If 7PM-7AM, please contact night-coverage [email or web address]  12/26/2020, 11:52 PM

## 2020-12-26 NOTE — ED Notes (Signed)
Patient transported to MRI 

## 2020-12-26 NOTE — ED Notes (Addendum)
Pt transported to CT at this time accompanied by RN.

## 2020-12-26 NOTE — ED Notes (Signed)
Pt to CT

## 2020-12-26 NOTE — ED Triage Notes (Signed)
EMS brings pt in from home for c/o sudden onset weakness while cooking; hx CVA 2004

## 2020-12-26 NOTE — ED Notes (Addendum)
Neuro-Tele cart in room and button pushed 1947

## 2020-12-26 NOTE — ED Notes (Signed)
Daughter called Probation officer to voice concern that pt has hx/o CVA and request pt be worked up for same.

## 2020-12-26 NOTE — Consult Note (Signed)
[facility] Telemedicine Consult   Requesting Provider: Dr Jari Pigg, [place] Consult Participants: Dr. Jerelyn Charles, Telespecialist RN [place].  Bedside RN Kasie Location of the provider: Home Location of the patient: ER @ [facility]  This consult was provided via telemedicine with 2-way video and audio communication. The patient/family was informed that care would be provided in this way and agreed to receive care in this manner.    Chief Complaint: Sudden onset of right leg weakness  HPI: 70 year old woman past medical history of depression, hypertension, prior strokes in the right hemisphere with no residual deficits, dementia, multiple falls, DVT and antiphospholipid antibody syndrome-on Coumadin with last INR 1.2, presented to the emergency room via EMS for sudden onset of right leg weakness. She reports that she was try to make supper and had a sudden onset of weakness that involve the right leg and made her imbalance. EMS was called, evaluated her, brought her to the ER where she was evaluated by the ED provider who noticed the right leg weakness and a code stroke was activated. The patient was seen via telemedicine. Detailed examination detailed below in addition to the NIH stroke scale.    Past Medical History:  Diagnosis Date  . Arthritis 04/02/1996  . Depression   . GERD (gastroesophageal reflux disease) 04/03/2019  . Hyperlipidemia   . Hypertension   . Renal insufficiency   . Stroke ([place])   . Thyroid disease     No current facility-administered medications for this encounter.  Current Outpatient Medications:  .  atorvastatin (LIPITOR) 10 MG tablet, Take 1 tablet (10 mg total) by mouth daily., Disp: 90 tablet, Rfl: 1 .  donepezil (ARICEPT) 10 MG tablet, Take 1 tablet by mouth daily., Disp: , Rfl:  .  escitalopram (LEXAPRO) 20 MG tablet, Take 1 tablet (20 mg total) by mouth daily., Disp: 90 tablet, Rfl: 0 .  famotidine (PEPCID) 20 MG tablet, Take 1 tablet by mouth daily.  singh, Disp: , Rfl:  .  ferrous sulfate 325 (65 FE) MG EC tablet, Take 1 tablet (325 mg total) by mouth daily with breakfast., Disp: 30 tablet, Rfl: 3 .  levothyroxine (SYNTHROID) 75 MCG tablet, Take 1 tablet (75 mcg total) by mouth daily., Disp: 30 tablet, Rfl: 5 .  montelukast (SINGULAIR) 10 MG tablet, TAKE 1 TABLET BY MOUTH EVERYDAY AT BEDTIME, Disp: 90 tablet, Rfl: 3 .  Multiple Vitamins-Minerals (CENTRUM SILVER 50+WOMEN PO), Take 1 tablet by mouth daily., Disp: , Rfl:  .  mupirocin ointment (BACTROBAN) 2 %, Place 1 application into the nose 2 (two) times daily., Disp: 22 g, Rfl: 0 .  torsemide (DEMADEX) 5 MG tablet, Take 1 tablet by mouth daily. singh, Disp: , Rfl:  .  traZODone (DESYREL) 50 MG tablet, Take 1 tablet (50 mg total) by mouth daily., Disp: 30 tablet, Rfl: 5 .  vitamin B-12 (CYANOCOBALAMIN) 1000 MCG tablet, Take 1,000 mcg by mouth daily., Disp: , Rfl:  .  warfarin (COUMADIN) 1 MG tablet, TAKE 1 TABLET BY MOUTH EVERY DAY, Disp: 90 tablet, Rfl: 1 .  warfarin (COUMADIN) 2 MG tablet, TAKE 1 TABLET BY MOUTH EVERY DAY, Disp: 90 tablet, Rfl: 0 .  warfarin (COUMADIN) 3 MG tablet, TAKE 1 TABLET BY MOUTH EVERY DAY, Disp: 90 tablet, Rfl: 0    LKW: 6 PM on 12/26/2020 tpa given?: No, too mild to treat-NIH 1.  Also, initially INR unavailable.  Later on INR 1.2.  Will remain in the window till 10:30 PM IR Thrombectomy? No, exam not consistent  with LVO  Modified Rankin Scale: 1-No significant post stroke disability and can perform usual duties with stroke symptoms Time of teleneurologist evaluation: 1952  Exam: Vitals:   12/26/20 1932 12/26/20 1945  BP:  (!) 151/75  Pulse:  77  Resp:  20  Temp:  98.2 F (36.8 C)  SpO2: 100% 100%    General: Awake alert in no acute distress Neurological exam Awake alert oriented x3 No dysarthria No aphasia Cranial nerve II to XII intact Motor examination with mild drift without touching the bed in the right lower extremity.  Otherwise no drift in  any of the other extremities Sensation intact to light touch without extinction Cerebellar exam with no evidence of ataxia in the upper extremities.   NIHSS 1A: Level of Consciousness - 0 1B: Ask Month and Age - 0 1C: 'Blink Eyes' & 'Squeeze Hands' - 0 2: Test Horizontal Extraocular Movements - 0 3: Test Visual Fields - 0 4: Test Facial Palsy - 0 5A: Test Left Arm Motor Drift - 0 5B: Test Right Arm Motor Drift - 0 6A: Test Left Leg Motor Drift - 0 6B: Test Right Leg Motor Drift - 1 7: Test Limb Ataxia - 0 8: Test Sensation - 0 9: Test Language/Aphasia- 0 10: Test Dysarthria - 0 11: Test Extinction/Inattention - 0 NIHSS score: 1   Imaging Reviewed: CT head with no acute findings.  Multiple old infarctions-right occipital lobe, left frontal convexity.  Chronic white matter changes  Labs reviewed in epic and pertinent values follow: CBC    Component Value Date/Time   WBC 5.2 06/03/2020 1418   WBC 6.3 12/08/2018 1620   RBC 3.24 (L) 06/03/2020 1418   RBC 3.58 (L) 12/08/2018 1620   HGB 11.1 12/12/2020 1413   HCT 31.1 (L) 06/03/2020 1418   PLT 239 06/03/2020 1418   MCV 96 06/03/2020 1418   MCV 98 04/23/2012 2100   MCH 33.0 06/03/2020 1418   MCH 32.4 12/08/2018 1620   MCHC 34.4 06/03/2020 1418   MCHC 33.2 12/08/2018 1620   RDW 11.9 06/03/2020 1418   RDW 12.7 04/23/2012 2100   LYMPHSABS 1.6 06/03/2020 1418   MONOABS 0.6 12/08/2018 1620   EOSABS 0.1 06/03/2020 1418   BASOSABS 0.1 06/03/2020 1418   CMP     Component Value Date/Time   NA 141 02/10/2020 1005   NA 141 04/23/2012 2100   K 4.2 02/10/2020 1005   K 3.7 04/23/2012 2100   CL 102 02/10/2020 1005   CL 106 04/23/2012 2100   CO2 26 02/10/2020 1005   CO2 28 04/23/2012 2100   GLUCOSE 98 02/10/2020 1005   GLUCOSE 105 (H) 10/15/2017 2204   GLUCOSE 112 (H) 04/23/2012 2100   BUN 27 02/10/2020 1005   BUN 26 (H) 04/23/2012 2100   CREATININE 1.59 (H) 02/10/2020 1005   CREATININE 1.29 04/23/2012 2100   CALCIUM 10.0  02/10/2020 1005   CALCIUM 9.1 04/23/2012 2100   PROT 7.2 02/10/2020 1005   PROT 7.7 04/23/2012 2100   ALBUMIN 4.4 02/10/2020 1005   ALBUMIN 4.0 04/23/2012 2100   AST 22 02/10/2020 1005   AST 33 04/23/2012 2100   ALT 16 02/10/2020 1005   ALT 31 04/23/2012 2100   ALKPHOS 90 02/10/2020 1005   ALKPHOS 63 04/23/2012 2100   BILITOT 0.6 02/10/2020 1005   BILITOT 0.5 04/23/2012 2100   GFRNONAA 33 (L) 02/10/2020 1005   GFRNONAA 45 (L) 04/23/2012 2100   GFRAA 38 (L) 02/10/2020 1005   GFRAA 52 (  L) 04/23/2012 2100     Assessment:  70 year old woman with above past medical history, on Coumadin, with last INR 1.2, presenting with sudden onset of right leg weakness. On telemedicine examination, NIH stroke scale of 1. Risk benefits of IV TPA discussed with the patient.  Reported to the daughter. Called the daughter, who is on her way from [place] checking into a flight at this time. Were able to have a detailed conversation-with the daughter [facility], as well as patient's sister-in-law Haruna Zaya would be the interim decision-maker till the time that the patient's daughter is in the air and can be reached at [phone]. Discussed the risks and benefits of IV TPA including the risk of bleed of about 6% and TPA being contraindicated in patients with INR of greater than 1.7. The family were concerned for the bleeding risk given mild stroke symptoms. Later when the INR came back to be subtherapeutic and within range to be able to give IV TPA, further discussion had with family and they decided that she not get TPA for right now.  TPA should be considered if she were to deteriorate in the near future until 10:30 PM which would be her 4-1/2 hours from last known well. I have recommended imaging as well as reconsulting as an acute code stroke should her symptoms acutely worsen.  Recommendations:  MRI of the brain without contrast-I have also ordered MRI of the head and neck. If  positive for stroke, admit for stroke work-up-which should include 2D echocardiogram, lipid panel, A1c. If positive for stroke, also hold Coumadin and do aspirin 81 only.  Timing of anticoagulation will be decided based on stroke size of positive for stroke. If negative for stroke, can follow-up with outpatient neurology In the interim, remains in the window for IV TPA till 10:30 PM on 12/26/2020. If her symptoms worsen, please pressed the button on the telemedicine neurology cart and activate another code stroke please press the button on the telemedicine neurology cart and activate another code stroke for discussion for TPA. The family has decided no TPA given mild stroke symptoms and the risk of bleeding with IV TPA if the symptoms continue to remain mild.  I had a detailed discussion with of about 20 minutes with the patient's daughter and sister-in-law on a three-way call.  I have discussed my plan in detail with Dr. Jari Pigg at the [facility] emergency room.  Also relayed plan in person to the bedside RN.   This patient is receiving critical care for possible acute neurological changes. There was 60 minutes of care by this provider at the time of service, including time for direct evaluation via telemedicine, review of medical records, imaging studies and discussion of findings with providers, the patient and/or family.  -- Amie Portland, MD Neurologist [facility] Pager: [phone]

## 2020-12-27 ENCOUNTER — Observation Stay: Payer: Medicare Other

## 2020-12-27 ENCOUNTER — Observation Stay
Admit: 2020-12-27 | Discharge: 2020-12-27 | Disposition: A | Discharge status: Home / Self Care | Payer: Medicare Other | Attending: Internal Medicine | Admitting: Internal Medicine

## 2020-12-27 ENCOUNTER — Other Ambulatory Visit: Payer: Self-pay

## 2020-12-27 DIAGNOSIS — D696 Thrombocytopenia, unspecified: Secondary | ICD-10-CM | POA: Diagnosis not present

## 2020-12-27 DIAGNOSIS — M1990 Unspecified osteoarthritis, unspecified site: Secondary | ICD-10-CM | POA: Diagnosis present

## 2020-12-27 DIAGNOSIS — R29701 NIHSS score 1: Secondary | ICD-10-CM | POA: Diagnosis present

## 2020-12-27 DIAGNOSIS — F32A Depression, unspecified: Secondary | ICD-10-CM | POA: Diagnosis present

## 2020-12-27 DIAGNOSIS — Z86718 Personal history of other venous thrombosis and embolism: Secondary | ICD-10-CM | POA: Diagnosis not present

## 2020-12-27 DIAGNOSIS — K219 Gastro-esophageal reflux disease without esophagitis: Secondary | ICD-10-CM | POA: Diagnosis present

## 2020-12-27 DIAGNOSIS — I639 Cerebral infarction, unspecified: Secondary | ICD-10-CM | POA: Diagnosis present

## 2020-12-27 DIAGNOSIS — Z823 Family history of stroke: Secondary | ICD-10-CM | POA: Diagnosis not present

## 2020-12-27 DIAGNOSIS — Z803 Family history of malignant neoplasm of breast: Secondary | ICD-10-CM | POA: Diagnosis not present

## 2020-12-27 DIAGNOSIS — I129 Hypertensive chronic kidney disease with stage 1 through stage 4 chronic kidney disease, or unspecified chronic kidney disease: Secondary | ICD-10-CM | POA: Diagnosis present

## 2020-12-27 DIAGNOSIS — Z7901 Long term (current) use of anticoagulants: Secondary | ICD-10-CM | POA: Diagnosis not present

## 2020-12-27 DIAGNOSIS — M810 Age-related osteoporosis without current pathological fracture: Secondary | ICD-10-CM | POA: Diagnosis present

## 2020-12-27 DIAGNOSIS — M79605 Pain in left leg: Secondary | ICD-10-CM | POA: Diagnosis not present

## 2020-12-27 DIAGNOSIS — N1832 Chronic kidney disease, stage 3b: Secondary | ICD-10-CM | POA: Diagnosis present

## 2020-12-27 DIAGNOSIS — I6389 Other cerebral infarction: Secondary | ICD-10-CM | POA: Diagnosis not present

## 2020-12-27 DIAGNOSIS — E039 Hypothyroidism, unspecified: Secondary | ICD-10-CM | POA: Diagnosis present

## 2020-12-27 DIAGNOSIS — H53462 Homonymous bilateral field defects, left side: Secondary | ICD-10-CM | POA: Diagnosis present

## 2020-12-27 DIAGNOSIS — G8191 Hemiplegia, unspecified affecting right dominant side: Secondary | ICD-10-CM | POA: Diagnosis present

## 2020-12-27 DIAGNOSIS — Z79899 Other long term (current) drug therapy: Secondary | ICD-10-CM | POA: Diagnosis not present

## 2020-12-27 DIAGNOSIS — Z880 Allergy status to penicillin: Secondary | ICD-10-CM | POA: Diagnosis not present

## 2020-12-27 DIAGNOSIS — Z79890 Hormone replacement therapy (postmenopausal): Secondary | ICD-10-CM | POA: Diagnosis not present

## 2020-12-27 DIAGNOSIS — I63422 Cerebral infarction due to embolism of left anterior cerebral artery: Secondary | ICD-10-CM | POA: Diagnosis not present

## 2020-12-27 DIAGNOSIS — R29898 Other symptoms and signs involving the musculoskeletal system: Secondary | ICD-10-CM | POA: Diagnosis not present

## 2020-12-27 DIAGNOSIS — D6861 Antiphospholipid syndrome: Secondary | ICD-10-CM | POA: Diagnosis not present

## 2020-12-27 DIAGNOSIS — M79604 Pain in right leg: Secondary | ICD-10-CM | POA: Diagnosis not present

## 2020-12-27 DIAGNOSIS — Z20822 Contact with and (suspected) exposure to covid-19: Secondary | ICD-10-CM | POA: Diagnosis present

## 2020-12-27 DIAGNOSIS — E785 Hyperlipidemia, unspecified: Secondary | ICD-10-CM | POA: Diagnosis present

## 2020-12-27 DIAGNOSIS — Z8051 Family history of malignant neoplasm of kidney: Secondary | ICD-10-CM | POA: Diagnosis not present

## 2020-12-27 DIAGNOSIS — E7801 Familial hypercholesterolemia: Secondary | ICD-10-CM | POA: Diagnosis present

## 2020-12-27 LAB — ECHOCARDIOGRAM COMPLETE
AR max vel: 2.13 cm2
AV Area VTI: 2.54 cm2
AV Area mean vel: 2.38 cm2
AV Mean grad: 2 mmHg
AV Peak grad: 4 mmHg
Ao pk vel: 1 m/s
Area-P 1/2: 5.58 cm2
MV VTI: 1.99 cm2
S' Lateral: 3.2 cm

## 2020-12-27 LAB — BASIC METABOLIC PANEL
Anion gap: 9 (ref 5–15)
BUN: 26 mg/dL — ABNORMAL HIGH (ref 8–23)
CO2: 24 mmol/L (ref 22–32)
Calcium: 9.3 mg/dL (ref 8.9–10.3)
Chloride: 105 mmol/L (ref 98–111)
Creatinine, Ser: 1.46 mg/dL — ABNORMAL HIGH (ref 0.44–1.00)
GFR, Estimated: 39 mL/min — ABNORMAL LOW (ref >60)
Glucose, Bld: 114 mg/dL — ABNORMAL HIGH (ref 70–99)
Potassium: 3.7 mmol/L (ref 3.5–5.1)
Sodium: 138 mmol/L (ref 135–145)

## 2020-12-27 LAB — HEMOGLOBIN A1C
Hgb A1c MFr Bld: 5.6 % (ref 4.8–5.6)
Mean Plasma Glucose: 114.02 mg/dL

## 2020-12-27 LAB — LIPID PANEL
Cholesterol: 158 mg/dL (ref 0–200)
HDL: 56 mg/dL (ref >40)
LDL Cholesterol: 87 mg/dL (ref 0–99)
Total CHOL/HDL Ratio: 2.8 RATIO
Triglycerides: 77 mg/dL (ref <150)
VLDL: 15 mg/dL (ref 0–40)

## 2020-12-27 LAB — CBC
HCT: 32.6 % — ABNORMAL LOW (ref 36.0–46.0)
Hemoglobin: 11.1 g/dL — ABNORMAL LOW (ref 12.0–15.0)
MCH: 33.5 pg (ref 26.0–34.0)
MCHC: 34 g/dL (ref 30.0–36.0)
MCV: 98.5 fL (ref 80.0–100.0)
Platelets: 86 10*3/uL — ABNORMAL LOW (ref 150–400)
RBC: 3.31 MIL/uL — ABNORMAL LOW (ref 3.87–5.11)
RDW: 12 % (ref 11.5–15.5)
WBC: 5.9 10*3/uL (ref 4.0–10.5)
nRBC: 0 % (ref 0.0–0.2)

## 2020-12-27 LAB — HIV ANTIBODY (ROUTINE TESTING W REFLEX): HIV Screen 4th Generation wRfx: NONREACTIVE

## 2020-12-27 IMAGING — US US EXTREM LOW VENOUS
1 series · 13 of 24 positions shown · non-contrast
Comparison: None.

CLINICAL DATA: Bilateral leg pain



[Series 1: us venous img lower bilat (dvt) · portal-venous · 13 of 52 slices shown]
[im 1/52]
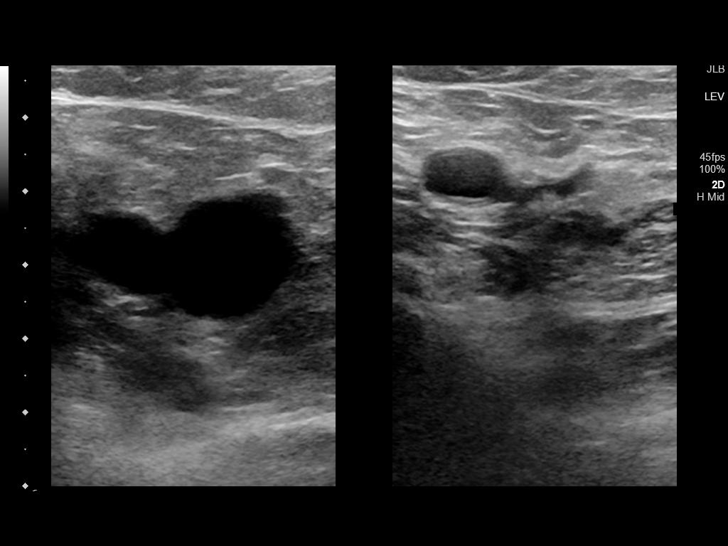
[im 5/52]
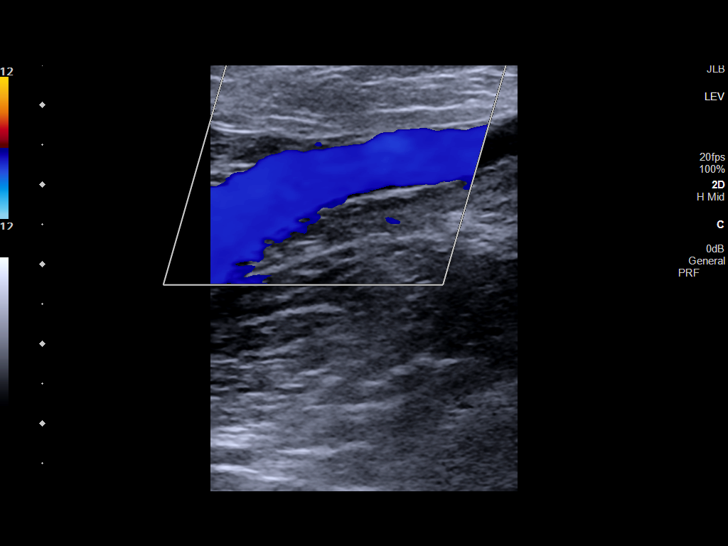
[im 9/52]
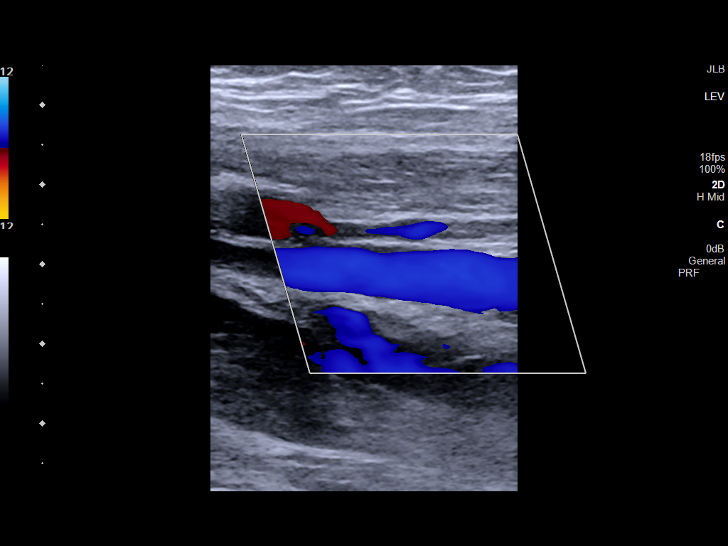
[im 14/52]
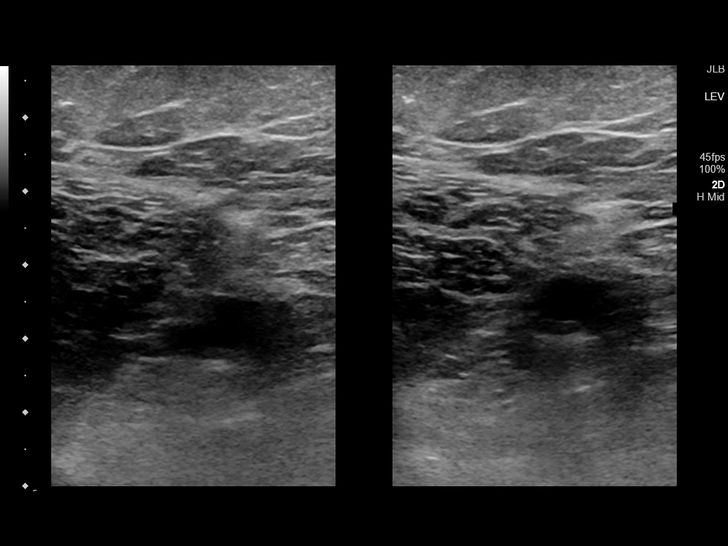
[im 18/52]
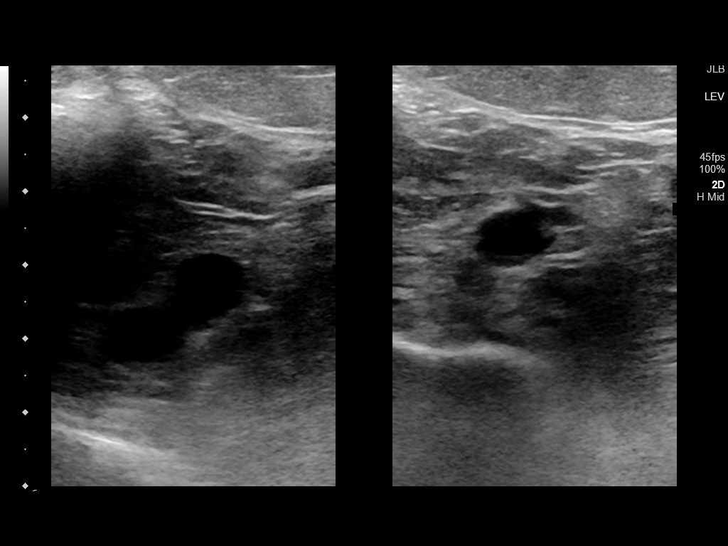
[im 23/52]
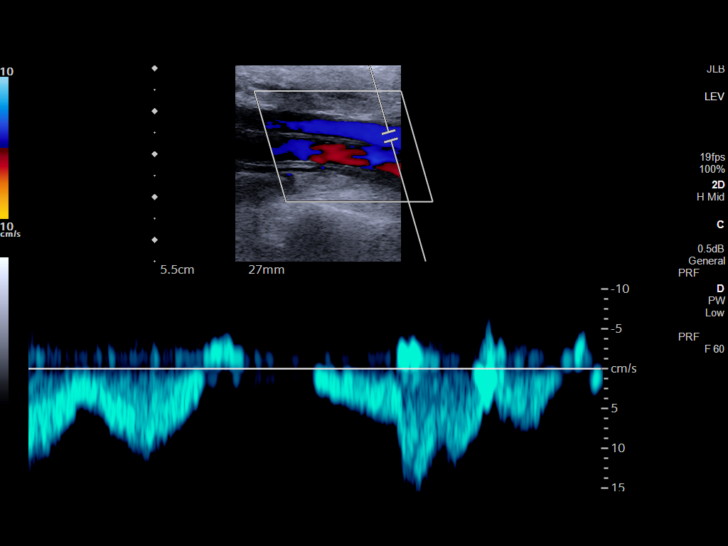
[im 27/52]
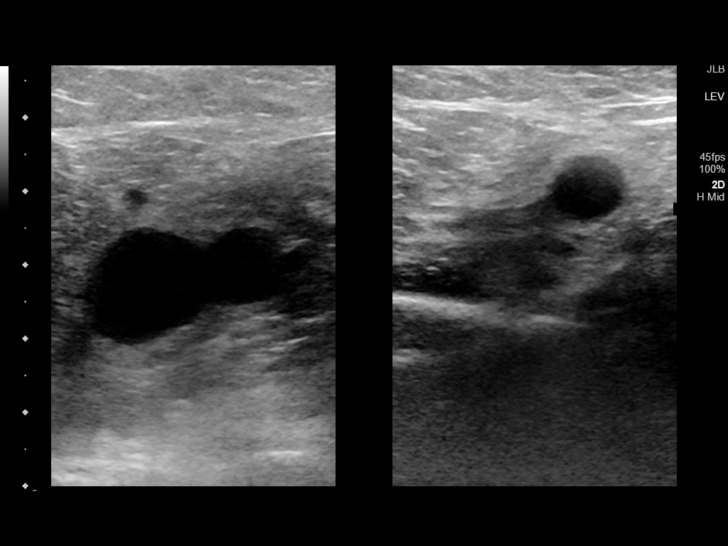
[im 29/52]
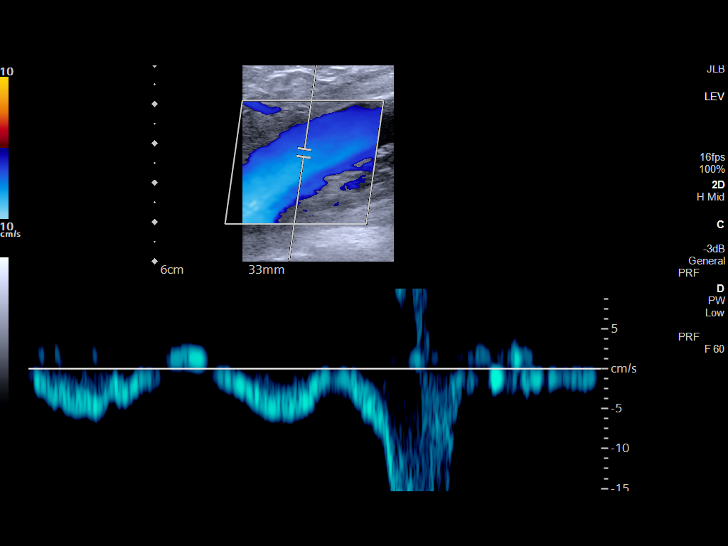
[im 34/52]
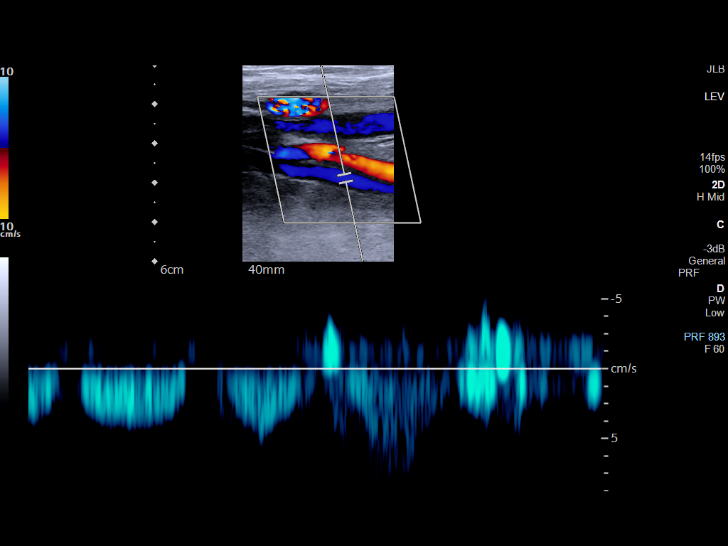
[im 38/52]
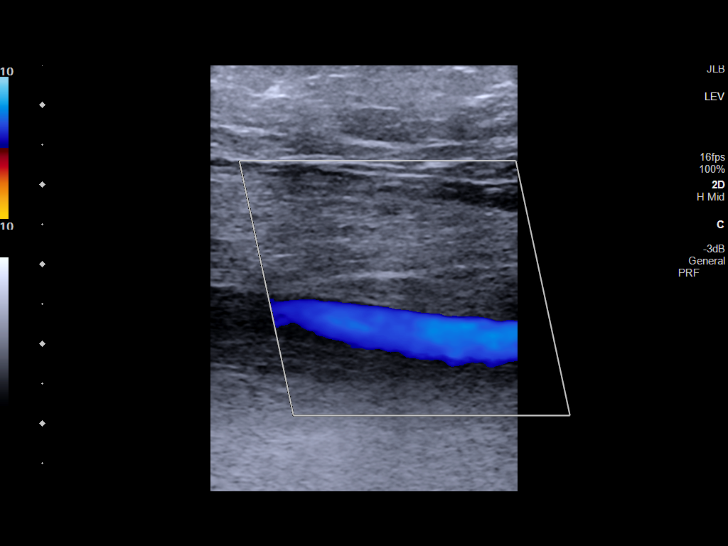
[im 43/52]
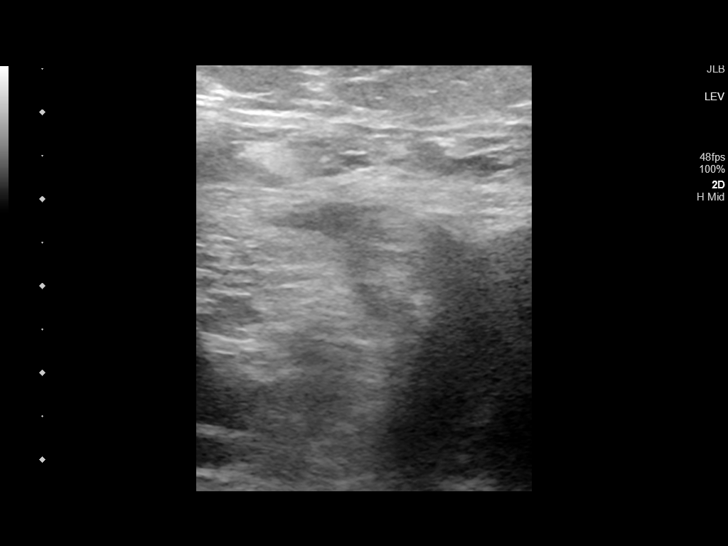
[im 47/52]
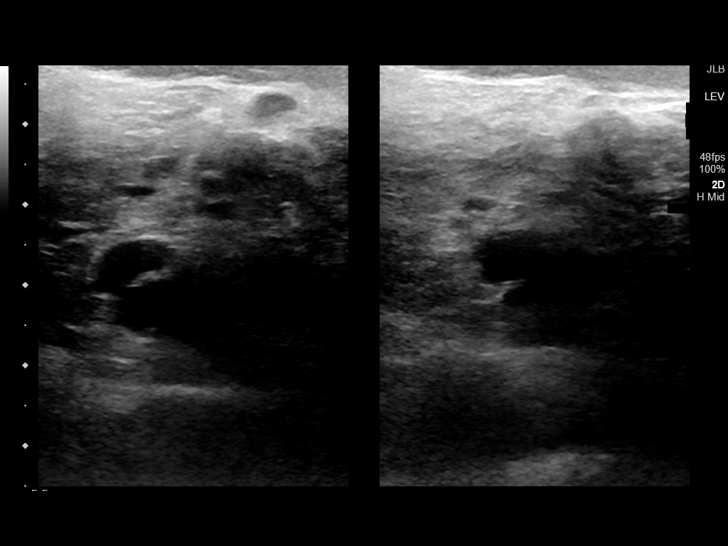
[im 52/52]
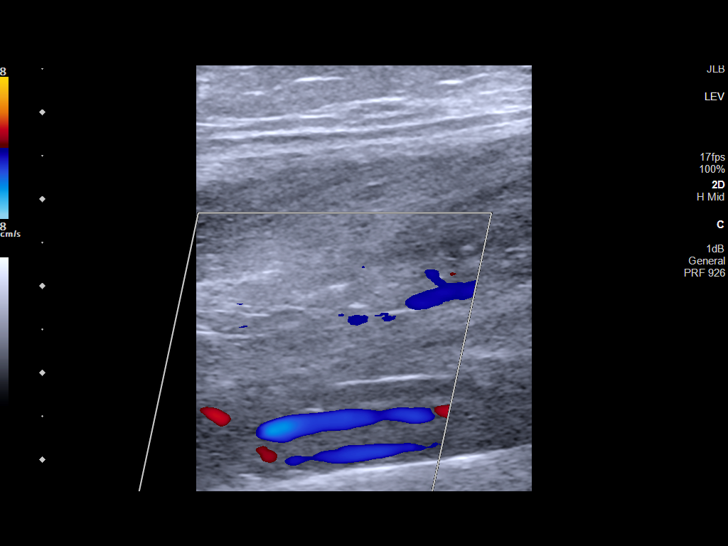

[13 of 24 positions shown; findings below may reference images not displayed]

FINDINGS: RIGHT LOWER EXTREMITY

Common Femoral Vein: No evidence of thrombus. Normal
compressibility, respiratory phasicity and response to augmentation.

Saphenofemoral Junction: No evidence of thrombus. Normal
compressibility and flow on color Doppler imaging.

Profunda Femoral Vein: No evidence of thrombus. Normal
compressibility and flow on color Doppler imaging.

Femoral Vein: No evidence of thrombus. Normal compressibility,
respiratory phasicity and response to augmentation.

Popliteal Vein: No evidence of thrombus. Normal compressibility,
respiratory phasicity and response to augmentation.

Calf Veins: No evidence of thrombus. Normal compressibility and flow
on color Doppler imaging.

Superficial Great Saphenous Vein: No evidence of thrombus. Normal
compressibility.

Venous Reflux:  None.

Other Findings:  None.

LEFT LOWER EXTREMITY

Common Femoral Vein: No evidence of thrombus. Normal
compressibility, respiratory phasicity and response to augmentation.

Saphenofemoral Junction: No evidence of thrombus. Normal
compressibility and flow on color Doppler imaging.

Profunda Femoral Vein: No evidence of thrombus. Normal
compressibility and flow on color Doppler imaging.

Femoral Vein: No evidence of thrombus. Normal compressibility,
respiratory phasicity and response to augmentation.

Popliteal Vein: No evidence of thrombus. Normal compressibility,
respiratory phasicity and response to augmentation.

Calf Veins: No evidence of thrombus. Normal compressibility and flow
on color Doppler imaging.

Superficial Great Saphenous Vein: No evidence of thrombus. Normal
compressibility.

Venous Reflux:  None.

Other Findings:  None.
IMPRESSION: No evidence of deep venous thrombosis in either lower extremity.

## 2020-12-27 MED ORDER — LOPERAMIDE HCL 2 MG PO CAPS
4.0000 mg | ORAL_CAPSULE | Freq: Once | ORAL | Status: AC
  Administered 2020-12-27: 4 mg via ORAL
  Filled 2020-12-27: qty 2

## 2020-12-27 MED ORDER — STROKE: EARLY STAGES OF RECOVERY BOOK: Freq: Once | Status: DC

## 2020-12-27 MED ORDER — ALUM & MAG HYDROXIDE-SIMETH 200-200-20 MG/5ML PO SUSP
30.0000 mL | Freq: Once | ORAL | Status: AC
  Administered 2020-12-27: 30 mL via ORAL
  Filled 2020-12-27: qty 30

## 2020-12-27 MED ORDER — ACETAMINOPHEN 650 MG RE SUPP: 650.0000 mg | RECTAL | Status: DC | PRN

## 2020-12-27 MED ORDER — TRAZODONE HCL 50 MG PO TABS
50.0000 mg | ORAL_TABLET | Freq: Every day | ORAL | Status: DC
  Administered 2020-12-27 – 2020-12-28 (×2): 50 mg via ORAL
  Filled 2020-12-27 (×2): qty 1

## 2020-12-27 MED ORDER — SODIUM CHLORIDE 0.9 % IV SOLN: INTRAVENOUS | Status: AC

## 2020-12-27 MED ORDER — ACETAMINOPHEN 160 MG/5ML PO SOLN
650.0000 mg | ORAL | Status: DC | PRN
  Filled 2020-12-27: qty 20.3

## 2020-12-27 MED ORDER — ACETAMINOPHEN 325 MG PO TABS
650.0000 mg | ORAL_TABLET | ORAL | Status: DC | PRN
  Administered 2020-12-27 – 2020-12-29 (×5): 650 mg via ORAL
  Filled 2020-12-27 (×5): qty 2

## 2020-12-27 MED ORDER — LEVOTHYROXINE SODIUM 50 MCG PO TABS
75.0000 ug | ORAL_TABLET | Freq: Every day | ORAL | Status: DC
  Administered 2020-12-27 – 2020-12-29 (×3): 75 ug via ORAL
  Filled 2020-12-27 (×4): qty 1

## 2020-12-27 MED ORDER — PERFLUTREN LIPID MICROSPHERE
1.0000 mL | INTRAVENOUS | Status: AC | PRN
  Administered 2020-12-27: 2 mL via INTRAVENOUS
  Filled 2020-12-27: qty 10

## 2020-12-27 MED ORDER — DONEPEZIL HCL 5 MG PO TABS
10.0000 mg | ORAL_TABLET | Freq: Every day | ORAL | Status: DC
  Administered 2020-12-27 – 2020-12-29 (×3): 10 mg via ORAL
  Filled 2020-12-27 (×3): qty 2

## 2020-12-27 MED ORDER — FERROUS SULFATE 325 (65 FE) MG PO TABS
325.0000 mg | ORAL_TABLET | Freq: Every day | ORAL | Status: DC
  Administered 2020-12-27 – 2020-12-28 (×2): 325 mg via ORAL
  Filled 2020-12-27 (×3): qty 1

## 2020-12-27 MED ORDER — ASPIRIN 81 MG PO CHEW
81.0000 mg | CHEWABLE_TABLET | Freq: Every day | ORAL | Status: DC
  Administered 2020-12-27 – 2020-12-29 (×3): 81 mg via ORAL
  Filled 2020-12-27 (×4): qty 1

## 2020-12-27 MED ORDER — LOPERAMIDE HCL 2 MG PO CAPS
2.0000 mg | ORAL_CAPSULE | Freq: Four times a day (QID) | ORAL | Status: DC | PRN
  Administered 2020-12-28: 2 mg via ORAL
  Filled 2020-12-27 (×3): qty 1

## 2020-12-27 MED ORDER — ESCITALOPRAM OXALATE 10 MG PO TABS
20.0000 mg | ORAL_TABLET | Freq: Every day | ORAL | Status: DC
Start: 2020-12-27 — End: 2020-12-29
  Administered 2020-12-27 – 2020-12-29 (×3): 20 mg via ORAL
  Filled 2020-12-27 (×3): qty 2

## 2020-12-27 MED ORDER — SENNOSIDES-DOCUSATE SODIUM 8.6-50 MG PO TABS: 1.0000 | ORAL_TABLET | Freq: Every evening | ORAL | Status: DC | PRN

## 2020-12-27 MED ORDER — MONTELUKAST SODIUM 10 MG PO TABS
10.0000 mg | ORAL_TABLET | Freq: Every day | ORAL | Status: DC
  Administered 2020-12-27 – 2020-12-28 (×2): 10 mg via ORAL
  Filled 2020-12-27 (×3): qty 1

## 2020-12-27 MED ORDER — ATORVASTATIN CALCIUM 20 MG PO TABS
40.0000 mg | ORAL_TABLET | Freq: Every day | ORAL | Status: DC
  Administered 2020-12-28: 40 mg via ORAL
  Filled 2020-12-27 (×2): qty 2

## 2020-12-27 MED ORDER — ATORVASTATIN CALCIUM 20 MG PO TABS
10.0000 mg | ORAL_TABLET | Freq: Every day | ORAL | Status: DC
  Administered 2020-12-27: 10 mg via ORAL
  Filled 2020-12-27: qty 1

## 2020-12-27 MED ORDER — FAMOTIDINE 20 MG PO TABS
20.0000 mg | ORAL_TABLET | Freq: Every day | ORAL | Status: DC
  Administered 2020-12-27 – 2020-12-29 (×3): 20 mg via ORAL
  Filled 2020-12-27 (×3): qty 1

## 2020-12-27 NOTE — Progress Notes (Signed)
PROGRESS NOTE    [name] [facility]  [id number] DOB: 06-03-1951 DOA: 12/26/2020 PCP: Juline Patch, MD   Brief Narrative: Taken from H&P [name] is a 70 y.o. female with medical history significant for history of CVA with residual left lateral visual field deficit, history of DVT and antiphospholipid antibody syndrome on Coumadin, CKD stage IIIb, hypertension, hyperlipidemia, hypothyroidism, depression, and mild cognitive impairment who presents to the ED for evaluation of right-sided weakness. Mostly involving the right leg.  On arrival she was hemodynamically stable, labs without any significant abnormality, UA negative for UTI, UDS negative, SARS-CoV-2 PCR negative. Initial CT head was without any acute findings and did show multiple old infarcts.  Family decided not to proceed with TPA.  MRI brain with multiple small infarcts with concern of embolic phenomena.  Patient with subtherapeutic INR on Coumadin can be infected.  Lower extremity DVT was negative for any clots.  Also shows multiple old infarcts and chronic small vessel ischemia.  MRI with distal occlusion of the right PCA noted and felt to be chronic given old PCA territory infarct.  Moderate stenosis of the left V4 segment also seen.  Neurology was consulted and she was not amenable for any procedure. PT/OT recommending CIR. after discussing with daughter patient is agreeable.  Subjective: Patient continued to have right leg weakness, unable to walk with PT due to new right leg weakness and a residual left-sided weakness from prior stroke. No other complaint.  Initially wants to go home with home health services, later after talking with her daughter she decided to proceed with CIR as recommended.  Assessment & Plan:   Principal Problem:   Acute CVA (cerebrovascular accident) ([place]) Active Problems:   Adult hypothyroidism   Essential hypertension   Anticoagulant long-term use   Stage 3 chronic kidney disease  (HCC)   Thrombocytopenia (HCC)   CVA (cerebral vascular accident) ([place])  Acute CVA with history of stroke: Multiple small acute infarcts within the anterior right frontal lobe , is small infarct in the parasagittal left frontal region.  More consistent with embolic phenomena secondary to subtherapeutic INR.  Patient with history of DVT and antiphospholipid syndrome.  His Coumadin is being held by neurology for 3 days.  She will need a therapeutic INR to prevent further stroke. Passed swallow evaluation. A1c of 5.6,  Lipid panel with LDL of 87-neurology recommending atorvastatin for goal LDL less than 70. Echocardiogram done-pending results. PT/OT are recommending CIR-pending rehab evaluation.  Thrombocytopenia.  New finding, remained stable at 37.  Platelets were 239 28-month ago.  No sign of bleeding -Smear review. -Patient will need outpatient hematology evaluation.  History of DVT and antiphospholipid antibody syndrome on Coumadin: INR subtherapeutic at 1.2 on arrival.  Coumadin on hold per neurology for 3 days.  Can resume on 12/30/2020. Lower extremity Doppler without any DVT.  Hypertension: Allow permissive hypertension for now.  CKD stage IIIb.  Creatinine seems stable and around her baseline today. -Continue to monitor -Avoid nephrotoxins  Hypothyroidism: Continue Synthroid.  Hyperlipidemia: Continue atorvastatin.  Depression: Continue Lexapro.  Mild cognitive impairment: Continue Aricept.  Objective: Vitals:   12/27/20 0115 12/27/20 0130 12/27/20 0636 12/27/20 1011  BP:  (!) 166/87 (!) 162/80 99/85  Pulse: 81 82 81 75  Resp: (!) 21 17 20 20   Temp:    (!) 96.5 F (35.8 C)  TempSrc:    Axillary  SpO2: 99% 100% 100% 97%    Intake/Output Summary (Last 24 hours) at 12/27/2020 1305 Last data  filed at 12/27/2020 1114 Gross per 24 hour  Intake 861.67 ml  Output 400 ml  Net 461.67 ml   There were no vitals filed for this visit.  Examination:  General  exam: Appears calm and comfortable  Respiratory system: Clear to auscultation. Respiratory effort normal. Cardiovascular system: S1 & S2 heard, RRR.  Gastrointestinal system: Soft, nontender, nondistended, bowel sounds positive. Central nervous system: Alert and oriented.  Right lower extremity 3/5, left 4/5, wrist 5/5.  No other focal deficit Extremities: No edema, no cyanosis, pulses intact and symmetrical. Psychiatry: Judgement and insight appear normal.    DVT prophylaxis: SCDs as patient has thrombocytopenia Code Status: Full Family Communication: Discussed with daughter Disposition Plan:  Status is: Inpatient  Remains inpatient appropriate because:Inpatient level of care appropriate due to severity of illness   Dispo: The patient is from: Home              Anticipated d/c is to: CIR              Anticipated d/c date is: 1 day              Patient currently is medically stable to d/c.   Difficult to place patient No   Consultants:   Neurology  Procedures:  Antimicrobials:   Data Reviewed: I have personally reviewed following labs and imaging studies  CBC: Recent Labs  Lab 12/26/20 1953 12/27/20 0350  WBC 6.3 5.9  NEUTROABS 4.6  --   HGB 10.6* 11.1*  HCT 31.9* 32.6*  MCV 99.4 98.5  PLT 86* 86*   Basic Metabolic Panel: Recent Labs  Lab 12/26/20 1953 12/27/20 0350  NA 138 138  K 3.9 3.7  CL 102 105  CO2 24 24  GLUCOSE 102* 114*  BUN 30* 26*  CREATININE 1.68* 1.46*  CALCIUM 9.6 9.3   GFR: CrCl cannot be calculated (Unknown ideal weight.). Liver Function Tests: Recent Labs  Lab 12/26/20 1953  AST 22  ALT 16  ALKPHOS 65  BILITOT 1.0  PROT 7.2  ALBUMIN 4.0   No results for input(s): LIPASE, AMYLASE in the last 168 hours. No results for input(s): AMMONIA in the last 168 hours. Coagulation Profile: Recent Labs  Lab 12/26/20 1953  INR 1.2   Cardiac Enzymes: No results for input(s): CKTOTAL, CKMB, CKMBINDEX, TROPONINI in the last 168  hours. BNP (last 3 results) No results for input(s): PROBNP in the last 8760 hours. HbA1C: Recent Labs    12/27/20 0350  HGBA1C 5.6   CBG: Recent Labs  Lab 12/26/20 1945  GLUCAP 43   Lipid Profile: Recent Labs    12/27/20 0350  CHOL 158  HDL 56  LDLCALC 87  TRIG 77  CHOLHDL 2.8   Thyroid Function Tests: No results for input(s): TSH, T4TOTAL, FREET4, T3FREE, THYROIDAB in the last 72 hours. Anemia Panel: No results for input(s): VITAMINB12, FOLATE, FERRITIN, TIBC, IRON, RETICCTPCT in the last 72 hours. Sepsis Labs: No results for input(s): PROCALCITON, LATICACIDVEN in the last 168 hours.  Recent Results (from the past 240 hour(s))  SARS Coronavirus 2 by RT PCR (hospital order, performed in [facility] hospital lab) Nasopharyngeal Nasopharyngeal Swab     Status: None   Collection Time: 12/26/20  7:58 PM   Specimen: Nasopharyngeal Swab  Result Value Ref Range Status   SARS Coronavirus 2 NEGATIVE NEGATIVE Final    Comment: (NOTE) SARS-CoV-2 target nucleic acids are NOT DETECTED.  The SARS-CoV-2 RNA is generally detectable in upper and lower respiratory specimens during  the acute phase of infection. The lowest concentration of SARS-CoV-2 viral copies this assay can detect is 250 copies / mL. A negative result does not preclude SARS-CoV-2 infection and should not be used as the sole basis for treatment or other patient management decisions.  A negative result may occur with improper specimen collection / handling, submission of specimen other than nasopharyngeal swab, presence of viral mutation(s) within the areas targeted by this assay, and inadequate number of viral copies (<250 copies / mL). A negative result must be combined with clinical observations, patient history, and epidemiological information.  Fact Sheet for Patients:   StrictlyIdeas.no  Fact Sheet for Healthcare Providers: BankingDealers.co.za  This test  is not yet approved or  cleared by the [place] FDA and has been authorized for detection and/or diagnosis of SARS-CoV-2 by FDA under an Emergency Use Authorization (EUA).  This EUA will remain in effect (meaning this test can be used) for the duration of the COVID-19 declaration under Section 564(b)(1) of the Act, 21 U.S.C. section 360bbb-3(b)(1), unless the authorization is terminated or revoked sooner.  Performed at [facility], [place], [address] [place], [place] [address]      Radiology Studies: MR ANGIO HEAD WO CONTRAST  Result Date: 12/26/2020 CLINICAL DATA:  Acute neurologic deficits. EXAM: MRI HEAD WITHOUT CONTRAST MRA HEAD WITHOUT CONTRAST MRA NECK WITHOUT CONTRAST TECHNIQUE: Multiplanar, multiecho pulse sequences of the brain and surrounding structures were obtained without intravenous contrast. Angiographic images of the Circle of Willis were obtained using MRA technique without intravenous contrast. Angiographic images of the neck were obtained using MRA technique without intravenous contrast. Carotid stenosis measurements (when applicable) are obtained utilizing NASCET criteria, using the distal internal carotid diameter as the denominator. COMPARISON:  None. FINDINGS: MRI HEAD FINDINGS Brain: Small acute infarct within the anterior right frontal lobe. There are old infarcts of both cerebellar hemispheres, the left frontal lobe, the right parietal lobe and the right occipital lobe. No acute or chronic hemorrhage. There is multifocal hyperintense T2-weighted signal within the white matter. Generalized volume loss without a clear lobar predilection. The midline structures are normal. Vascular: Major flow voids are preserved. Skull and upper cervical spine: Normal calvarium and skull base. Visualized upper cervical spine and soft tissues are normal. Sinuses/Orbits:No paranasal sinus fluid levels or advanced mucosal thickening. No mastoid or middle ear effusion. Normal orbits.  MRA HEAD FINDINGS POSTERIOR CIRCULATION: --Vertebral arteries: Normal --Inferior cerebellar arteries: Normal. --Basilar artery: Normal. --Superior cerebellar arteries: Normal. --Posterior cerebral arteries: Distal occlusion of the right PCA is likely chronic given old PCA territory infarct. Moderate stenosis of the left P4 segment. ANTERIOR CIRCULATION: --Intracranial internal carotid arteries: Normal. --Anterior cerebral arteries (ACA): Normal. --Middle cerebral arteries (MCA): Normal. ANATOMIC VARIANTS: None MRA NECK FINDINGS Codominant vertebral arteries are normal along the entirety of the visualized portion. Time-of-flight imaging poorly images of the V1 and V3 segments. No carotid stenosis. IMPRESSION: 1. Small acute infarct within the anterior right frontal lobe. No hemorrhage or mass effect. 2. Multiple old infarcts and chronic small vessel ischemia. 3. Distal occlusion of the right PCA is likely chronic given old PCA territory infarct. 4. Moderate stenosis of the left P4 segment. 5. Time-of-flight MRA of the neck without hemodynamically significant stenosis. Electronically Signed   By: Ulyses Jarred M.D.   On: 12/26/2020 23:25   MR ANGIO NECK WO CONTRAST  Result Date: 12/26/2020 CLINICAL DATA:  Acute neurologic deficits. EXAM: MRI HEAD WITHOUT CONTRAST MRA HEAD WITHOUT CONTRAST MRA NECK WITHOUT CONTRAST TECHNIQUE:  Multiplanar, multiecho pulse sequences of the brain and surrounding structures were obtained without intravenous contrast. Angiographic images of the Circle of Willis were obtained using MRA technique without intravenous contrast. Angiographic images of the neck were obtained using MRA technique without intravenous contrast. Carotid stenosis measurements (when applicable) are obtained utilizing NASCET criteria, using the distal internal carotid diameter as the denominator. COMPARISON:  None. FINDINGS: MRI HEAD FINDINGS Brain: Small acute infarct within the anterior right frontal lobe. There  are old infarcts of both cerebellar hemispheres, the left frontal lobe, the right parietal lobe and the right occipital lobe. No acute or chronic hemorrhage. There is multifocal hyperintense T2-weighted signal within the white matter. Generalized volume loss without a clear lobar predilection. The midline structures are normal. Vascular: Major flow voids are preserved. Skull and upper cervical spine: Normal calvarium and skull base. Visualized upper cervical spine and soft tissues are normal. Sinuses/Orbits:No paranasal sinus fluid levels or advanced mucosal thickening. No mastoid or middle ear effusion. Normal orbits. MRA HEAD FINDINGS POSTERIOR CIRCULATION: --Vertebral arteries: Normal --Inferior cerebellar arteries: Normal. --Basilar artery: Normal. --Superior cerebellar arteries: Normal. --Posterior cerebral arteries: Distal occlusion of the right PCA is likely chronic given old PCA territory infarct. Moderate stenosis of the left P4 segment. ANTERIOR CIRCULATION: --Intracranial internal carotid arteries: Normal. --Anterior cerebral arteries (ACA): Normal. --Middle cerebral arteries (MCA): Normal. ANATOMIC VARIANTS: None MRA NECK FINDINGS Codominant vertebral arteries are normal along the entirety of the visualized portion. Time-of-flight imaging poorly images of the V1 and V3 segments. No carotid stenosis. IMPRESSION: 1. Small acute infarct within the anterior right frontal lobe. No hemorrhage or mass effect. 2. Multiple old infarcts and chronic small vessel ischemia. 3. Distal occlusion of the right PCA is likely chronic given old PCA territory infarct. 4. Moderate stenosis of the left P4 segment. 5. Time-of-flight MRA of the neck without hemodynamically significant stenosis. Electronically Signed   By: Ulyses Jarred M.D.   On: 12/26/2020 23:25   MR BRAIN WO CONTRAST  Result Date: 12/26/2020 CLINICAL DATA:  Acute neurologic deficits. EXAM: MRI HEAD WITHOUT CONTRAST MRA HEAD WITHOUT CONTRAST MRA NECK  WITHOUT CONTRAST TECHNIQUE: Multiplanar, multiecho pulse sequences of the brain and surrounding structures were obtained without intravenous contrast. Angiographic images of the Circle of Willis were obtained using MRA technique without intravenous contrast. Angiographic images of the neck were obtained using MRA technique without intravenous contrast. Carotid stenosis measurements (when applicable) are obtained utilizing NASCET criteria, using the distal internal carotid diameter as the denominator. COMPARISON:  None. FINDINGS: MRI HEAD FINDINGS Brain: Small acute infarct within the anterior right frontal lobe. There are old infarcts of both cerebellar hemispheres, the left frontal lobe, the right parietal lobe and the right occipital lobe. No acute or chronic hemorrhage. There is multifocal hyperintense T2-weighted signal within the white matter. Generalized volume loss without a clear lobar predilection. The midline structures are normal. Vascular: Major flow voids are preserved. Skull and upper cervical spine: Normal calvarium and skull base. Visualized upper cervical spine and soft tissues are normal. Sinuses/Orbits:No paranasal sinus fluid levels or advanced mucosal thickening. No mastoid or middle ear effusion. Normal orbits. MRA HEAD FINDINGS POSTERIOR CIRCULATION: --Vertebral arteries: Normal --Inferior cerebellar arteries: Normal. --Basilar artery: Normal. --Superior cerebellar arteries: Normal. --Posterior cerebral arteries: Distal occlusion of the right PCA is likely chronic given old PCA territory infarct. Moderate stenosis of the left P4 segment. ANTERIOR CIRCULATION: --Intracranial internal carotid arteries: Normal. --Anterior cerebral arteries (ACA): Normal. --Middle cerebral arteries (MCA): Normal. ANATOMIC VARIANTS: None MRA NECK  FINDINGS Codominant vertebral arteries are normal along the entirety of the visualized portion. Time-of-flight imaging poorly images of the V1 and V3 segments. No carotid  stenosis. IMPRESSION: 1. Small acute infarct within the anterior right frontal lobe. No hemorrhage or mass effect. 2. Multiple old infarcts and chronic small vessel ischemia. 3. Distal occlusion of the right PCA is likely chronic given old PCA territory infarct. 4. Moderate stenosis of the left P4 segment. 5. Time-of-flight MRA of the neck without hemodynamically significant stenosis. Electronically Signed   By: Ulyses Jarred M.D.   On: 12/26/2020 23:25   US Venous Img Lower Bilateral (DVT)  Result Date: 12/27/2020 CLINICAL DATA:  Bilateral leg pain EXAM: BILATERAL LOWER EXTREMITY VENOUS DOPPLER ULTRASOUND TECHNIQUE: Gray-scale sonography with graded compression, as well as color Doppler and duplex ultrasound were performed to evaluate the lower extremity deep venous systems from the level of the common femoral vein and including the common femoral, femoral, profunda femoral, popliteal and calf veins including the posterior tibial, peroneal and gastrocnemius veins when visible. The superficial great saphenous vein was also interrogated. Spectral Doppler was utilized to evaluate flow at rest and with distal augmentation maneuvers in the common femoral, femoral and popliteal veins. COMPARISON:  None. FINDINGS: RIGHT LOWER EXTREMITY Common Femoral Vein: No evidence of thrombus. Normal compressibility, respiratory phasicity and response to augmentation. Saphenofemoral Junction: No evidence of thrombus. Normal compressibility and flow on color Doppler imaging. Profunda Femoral Vein: No evidence of thrombus. Normal compressibility and flow on color Doppler imaging. Femoral Vein: No evidence of thrombus. Normal compressibility, respiratory phasicity and response to augmentation. Popliteal Vein: No evidence of thrombus. Normal compressibility, respiratory phasicity and response to augmentation. Calf Veins: No evidence of thrombus. Normal compressibility and flow on color Doppler imaging. Superficial Great Saphenous  Vein: No evidence of thrombus. Normal compressibility. Venous Reflux:  None. Other Findings:  None. LEFT LOWER EXTREMITY Common Femoral Vein: No evidence of thrombus. Normal compressibility, respiratory phasicity and response to augmentation. Saphenofemoral Junction: No evidence of thrombus. Normal compressibility and flow on color Doppler imaging. Profunda Femoral Vein: No evidence of thrombus. Normal compressibility and flow on color Doppler imaging. Femoral Vein: No evidence of thrombus. Normal compressibility, respiratory phasicity and response to augmentation. Popliteal Vein: No evidence of thrombus. Normal compressibility, respiratory phasicity and response to augmentation. Calf Veins: No evidence of thrombus. Normal compressibility and flow on color Doppler imaging. Superficial Great Saphenous Vein: No evidence of thrombus. Normal compressibility. Venous Reflux:  None. Other Findings:  None. IMPRESSION: No evidence of deep venous thrombosis in either lower extremity. Electronically Signed   By: Inez Catalina M.D.   On: 12/27/2020 11:58   CT HEAD CODE STROKE WO CONTRAST  Result Date: 12/26/2020 CLINICAL DATA:  Code stroke.  Right arm and leg weakness EXAM: CT HEAD WITHOUT CONTRAST TECHNIQUE: Contiguous axial images were obtained from the base of the skull through the vertex without intravenous contrast. COMPARISON:  04/23/2012 FINDINGS: Brain: No acute finding by CT. Few old small vessel cerebellar infarctions. Old infarction in the right occipital lobe, at the right parietal vertex, and at the left frontal convexity. Chronic small-vessel ischemic changes of the hemispheric white matter. No mass, hemorrhage, hydrocephalus or extra-axial collection. Vascular: There is atherosclerotic calcification of the major vessels at the base of the brain. Skull: Negative Sinuses/Orbits: Clear Other: None ASPECTS ([place] Stroke Program Early CT Score) - Ganglionic level infarction (caudate, lentiform nuclei, internal  capsule, insula, M1-M3 cortex): 7, allowing for the old infarction. - Supraganglionic infarction (M4-M6 cortex):  3, allowing for the old infarction. Total score (0-10 with 10 being normal): 10, allowing for the old infarction. IMPRESSION: 1. No acute finding by CT. 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 Chronic small-vessel ischemic changes of the white matter. 2. ASPECTS is 10, allowing for the old infarction. 3. These results were called by telephone at the time of interpretation on 12/26/2020 at 8:07 pm to provider Dr. Nickolas Madrid, Who verbally acknowledged these results. Electronically Signed   By: Nelson Chimes M.D.   On: 12/26/2020 20:09    Scheduled Meds: .  stroke: mapping our early stages of recovery book   Does not apply Once  . aspirin  81 mg Oral Daily  . [START ON 12/28/2020] atorvastatin  40 mg Oral Daily  . donepezil  10 mg Oral Daily  . escitalopram  20 mg Oral Daily  . famotidine  20 mg Oral Daily  . ferrous sulfate  325 mg Oral QHS  . levothyroxine  75 mcg Oral Q0600  . montelukast  10 mg Oral QHS   Continuous Infusions:   LOS: 0 days   Time spent: 35 minutes.  Lorella Nimrod, MD [facility]  If 7PM-7AM, please contact night-coverage [email or web address]  12/27/2020, 1:05 PM   This record has been created using Systems analyst. Errors have been sought and corrected,but may not always be located. Such creation errors do not reflect on the standard of care.

## 2020-12-27 NOTE — Progress Notes (Signed)
*  PRELIMINARY RESULTS* Echocardiogram 2D Echocardiogram has been performed.  Wallie Char Justeen Hehr 12/27/2020, 11:57 AM

## 2020-12-27 NOTE — Progress Notes (Signed)
SLP Cancellation Note  Patient Details Name: [name] MRN: [id number] DOB: 25-Jun-1951   Cancelled treatment:       Reason Eval/Treat Not Completed: SLP screened, no needs identified, will sign off (chart reviewed; consulted NSG re: pt's status; met w/ pt) Pt denied any difficulty swallowing and is currently on a regular diet; tolerates swallowing pills w/ water per NSG. She consumed sips of drink/tea while in room w/out deficits noted. Pt conversed at conversational level w/out gross deficits noted; pt denied-language deficits, and NSG agreed. Noted pt jumped quickly b/t topics. Per chart/MD notes, pt has Mild Cognitive impairment Baseline. Encourage more formal f/u post D/C home If any decline from PLOF is noted in her ADLs re: Cognition. Helped pt order a sandwich; she stated she wanted an Ensure too. No further skilled ST services indicated as pt appears at her baseline. Pt agreed. NSG to reconsult if any change in status while admitted.     [name], MS, CCC-SLP Speech Language Pathologist Rehab Services [phone] [facility] 12/27/2020, 12:18 PM

## 2020-12-27 NOTE — Progress Notes (Signed)
Inpatient Rehab Admissions Coordinator Note:   Per therapy recommendations, pt was screened for CIR candidacy by Retia Cordle, MS CCC-SLP. At this time, Pt. Appears to have functional decline and is a good candidate for CIR. Will place order for rehab consult per protocol.  Please contact me with questions.   Clark Clowdus, MS, CCC-SLP Rehab Admissions Coordinator  [phone] (celll) [phone] (office)  

## 2020-12-27 NOTE — Progress Notes (Signed)
Neurology Progress Note   S:// Seen and examined.  Continues to have right lower extremity weakness unchanged from yesterday but subjectively she feels better.   O:// Current vital signs: BP (!) 162/80   Pulse 81   Temp 98.2 F (36.8 C) (Oral)   Resp 20   SpO2 100%  Vital signs in last 24 hours: Temp:  [98.2 F (36.8 C)] 98.2 F (36.8 C) (01/24 1945) Pulse Rate:  [68-82] 81 (01/25 0636) Resp:  [14-22] 20 (01/25 0636) BP: (118-166)/(75-99) 162/80 (01/25 0636) SpO2:  [97 %-100 %] 100 % (01/25 0636) General: Awake alert in no distress HEENT: Respiratory manic Lungs: Clear Cardiovascular: Regular rhythm Abdomen nondistended nontender Extremities warm well perfused Neurological exam awake alert oriented x3 No dysarthria No aphasia Cranial nerves II to XII intact-except left homonymous hemianopsia. Motor exam with right lower extremity 3-4/5 at the hip with more distal weakness.  Other extremities 5/5. Sensory exam: Intact to touch without extinction Coordination: No dysmetria NIH stroke scale remains 1 for the right lower extremity.  Medications  Current Facility-Administered Medications:  .   stroke: mapping our early stages of recovery book, , Does not apply, Once, Patel, Vishal R, MD .  0.9 %  sodium chloride infusion, , Intravenous, Continuous, Zada Finders R, MD, Last Rate: 100 mL/hr at 12/27/20 0237, New Bag at 12/27/20 0237 .  acetaminophen (TYLENOL) tablet 650 mg, 650 mg, Oral, Q4H PRN, 650 mg at 12/27/20 0652 **OR** acetaminophen (TYLENOL) 160 MG/5ML solution 650 mg, 650 mg, Per Tube, Q4H PRN **OR** acetaminophen (TYLENOL) suppository 650 mg, 650 mg, Rectal, Q4H PRN, Posey Pronto, Vishal R, MD .  aspirin chewable tablet 81 mg, 81 mg, Oral, Daily, Zada Finders R, MD, 81 mg at 12/27/20 0239 .  atorvastatin (LIPITOR) tablet 10 mg, 10 mg, Oral, Daily, Patel, Vishal R, MD .  donepezil (ARICEPT) tablet 10 mg, 10 mg, Oral, Daily, Patel, Vishal R, MD .  escitalopram (LEXAPRO)  tablet 20 mg, 20 mg, Oral, Daily, Patel, Vishal R, MD .  levothyroxine (SYNTHROID) tablet 75 mcg, 75 mcg, Oral, Q0600, Lenore Cordia, MD, 75 mcg at 12/27/20 [phone] .  senna-docusate (Senokot-S) tablet 1 tablet, 1 tablet, Oral, QHS PRN, Lenore Cordia, MD  Current Outpatient Medications:  .  atorvastatin (LIPITOR) 10 MG tablet, Take 1 tablet (10 mg total) by mouth daily., Disp: 90 tablet, Rfl: 1 .  donepezil (ARICEPT) 10 MG tablet, Take 1 tablet by mouth daily., Disp: , Rfl:  .  escitalopram (LEXAPRO) 20 MG tablet, Take 1 tablet (20 mg total) by mouth daily., Disp: 90 tablet, Rfl: 0 .  famotidine (PEPCID) 20 MG tablet, Take 1 tablet by mouth daily. singh, Disp: , Rfl:  .  ferrous sulfate 325 (65 FE) MG EC tablet, Take 1 tablet (325 mg total) by mouth daily with breakfast. (Patient taking differently: Take 325 mg by mouth at bedtime.), Disp: 30 tablet, Rfl: 3 .  levothyroxine (SYNTHROID) 75 MCG tablet, Take 1 tablet (75 mcg total) by mouth daily., Disp: 30 tablet, Rfl: 5 .  losartan (COZAAR) 25 MG tablet, Take 25 mg by mouth daily., Disp: , Rfl:  .  montelukast (SINGULAIR) 10 MG tablet, TAKE 1 TABLET BY MOUTH EVERYDAY AT BEDTIME, Disp: 90 tablet, Rfl: 3 .  Multiple Vitamins-Minerals (CENTRUM SILVER 50+WOMEN PO), Take 1 tablet by mouth daily., Disp: , Rfl:  .  solifenacin (VESICARE) 5 MG tablet, Take 5 mg by mouth daily., Disp: , Rfl:  .  torsemide (DEMADEX) 5 MG tablet, Take 1  tablet by mouth daily. singh, Disp: , Rfl:  .  traZODone (DESYREL) 50 MG tablet, Take 1 tablet (50 mg total) by mouth daily., Disp: 30 tablet, Rfl: 5 .  vitamin B-12 (CYANOCOBALAMIN) 1000 MCG tablet, Take 1,000 mcg by mouth daily., Disp: , Rfl:  .  warfarin (COUMADIN) 2 MG tablet, TAKE 1 TABLET BY MOUTH EVERY DAY (Patient taking differently: Take 2 mg by mouth See admin instructions. Take 1 tablet on Tuesday, Thursday, Saturday and  Sunday.), Disp: 90 tablet, Rfl: 0 .  warfarin (COUMADIN) 3 MG tablet, TAKE 1 TABLET BY MOUTH  EVERY DAY (Patient taking differently: Take 3 mg by mouth See admin instructions. Take 1 tablet on Monday, Wednesday, and Friday.), Disp: 90 tablet, Rfl: 0 .  mupirocin ointment (BACTROBAN) 2 %, Place 1 application into the nose 2 (two) times daily., Disp: 22 g, Rfl: 0 Labs CBC    Component Value Date/Time   WBC 5.9 12/27/2020 0350   RBC 3.31 (L) 12/27/2020 0350   HGB 11.1 (L) 12/27/2020 0350   HGB 11.1 12/12/2020 1413   HCT 32.6 (L) 12/27/2020 0350   HCT 31.1 (L) 06/03/2020 1418   PLT 86 (L) 12/27/2020 0350   PLT 239 06/03/2020 1418   MCV 98.5 12/27/2020 0350   MCV 96 06/03/2020 1418   MCV 98 04/23/2012 2100   MCH 33.5 12/27/2020 0350   MCHC 34.0 12/27/2020 0350   RDW 12.0 12/27/2020 0350   RDW 11.9 06/03/2020 1418   RDW 12.7 04/23/2012 2100   LYMPHSABS 1.1 12/26/2020 1953   LYMPHSABS 1.6 06/03/2020 1418   MONOABS 0.5 12/26/2020 1953   EOSABS 0.1 12/26/2020 1953   EOSABS 0.1 06/03/2020 1418   BASOSABS 0.1 12/26/2020 1953   BASOSABS 0.1 06/03/2020 1418    CMP     Component Value Date/Time   NA 138 12/27/2020 0350   NA 141 02/10/2020 1005   NA 141 04/23/2012 2100   K 3.7 12/27/2020 0350   K 3.7 04/23/2012 2100   CL 105 12/27/2020 0350   CL 106 04/23/2012 2100   CO2 24 12/27/2020 0350   CO2 28 04/23/2012 2100   GLUCOSE 114 (H) 12/27/2020 0350   GLUCOSE 112 (H) 04/23/2012 2100   BUN 26 (H) 12/27/2020 0350   BUN 27 02/10/2020 1005   BUN 26 (H) 04/23/2012 2100   CREATININE 1.46 (H) 12/27/2020 0350   CREATININE 1.29 04/23/2012 2100   CALCIUM 9.3 12/27/2020 0350   CALCIUM 9.1 04/23/2012 2100   PROT 7.2 12/26/2020 1953   PROT 7.2 02/10/2020 1005   PROT 7.7 04/23/2012 2100   ALBUMIN 4.0 12/26/2020 1953   ALBUMIN 4.4 02/10/2020 1005   ALBUMIN 4.0 04/23/2012 2100   AST 22 12/26/2020 1953   AST 33 04/23/2012 2100   ALT 16 12/26/2020 1953   ALT 31 04/23/2012 2100   ALKPHOS 65 12/26/2020 1953   ALKPHOS 63 04/23/2012 2100   BILITOT 1.0 12/26/2020 1953   BILITOT 0.6  02/10/2020 1005   BILITOT 0.5 04/23/2012 2100   GFRNONAA 39 (L) 12/27/2020 0350   GFRNONAA 45 (L) 04/23/2012 2100   GFRAA 38 (L) 02/10/2020 1005   GFRAA 52 (L) 04/23/2012 2100    glycosylated hemoglobin-pending LDL 87  Lipid Panel     Component Value Date/Time   CHOL 158 12/27/2020 0350   CHOL 196 02/10/2020 1005   TRIG 77 12/27/2020 0350   HDL 56 12/27/2020 0350   HDL 64 02/10/2020 1005   CHOLHDL 2.8 12/27/2020 0350   VLDL 15  12/27/2020 0350   LDLCALC 87 12/27/2020 0350   LDLCALC 111 (H) 02/10/2020 1005     Imaging I have reviewed images in epic and the results pertinent to this consultation are: CTH-no acute changes.  Multiple old infarctions-right occipital, left frontal convexity And right posterior frontal lobe. MRI-small area of an acute stroke in the right frontal area as marked by the green arrow read by radiology.  On my review, there is also a small area in the parasagittal left frontal region which showed restricted diffusion-marked in the thick white arrow  MRA head and neck with distal occlusion of the right PCA that is likely chronic given the old PCA territory infarct on the right.  Moderate stenosis of the left V4.  MRA head with no significant stenosis.  Assessment:  70 year old woman, who has a past medical history of antiphospholipid antibody syndrome, DVT, dementia, falls, on Coumadin-subtherapeutic INR on presentation, evaluated by telemedicine neurology yesterday for sudden onset of right leg weakness.  Low NIH stroke scale precluded TPA administration along with the fact that she was on Coumadin, initially with unknown INR. Detailed discussion yesterday with the family-daughter and sister-in-law regarding risks and benefits of TPA and ultimately decided that unless her symptoms worsen, no TPA to be administered. Her MRI was completed overnight-it shows a embolic-looking infarct in the right frontal area as well as a small embolic-looking infarction in the  left parasagittal frontal area. The left frontal lesion can explain her right leg weakness-the right brain finding does not corroborate with the right leg weakness. Likely etiology of her strokes is embolic given subtherapeutic anticoagulation.  Impression: Acute ischemic stroke  Recommendations: Telemetry Frequent neurochecks I would hold off on anticoagulation for right now and resume her Coumadin in about 3 days.  Her last 2 Coumadin levels this month have been subtherapeutic.  She needs to be educated on the importance of remaining therapeutic with Coumadin. Aspirin for stroke prevention for now. 2D echo A1c PT OT Speech therapy Atorvastatin for an LDL goal less than 70 N.p.o. until cleared by stroke swallow screen or formal swallow evaluation Given history of DVT, would recommend checking lower extremity Dopplers.  Plan relayed to Dr. Reesa Chew  -- Amie Portland, MD Neurologist [facility] Pager: [phone]

## 2020-12-27 NOTE — Evaluation (Signed)
Occupational Therapy Evaluation Patient Details Name: [name] MRN: [id number] DOB: Sep 24, 1951 Today's Date: 12/27/2020    History of Present Illness [name] is a 61yoF who [facility]  on 1/24 with Rt hemi weakness Leg > Arm. Small acute infarct within the anterior right frontal lobe seen on MRI imaging. PMH: CVA c left field deficits, DVT and antiphospholipid antibody syndrome on coumadin, CKD3b, HTN, HLD, hypoTSH, depression, mild cognitive impairment.   Clinical Impression   [name] was seen for OT evaluation this date. Prior to hospital admission, pt was Independent including caregiving for her husband. Pt lives c husband and has PCA for her husband PRN. Pt presents to acute OT demonstrating impaired ADL performance and functional mobility 2/2 poor insight into deficits, decreased functional use of dominant RUE, and functional strength/balance deficits.   Pt currently requires MAX A don R sock at bed level, MIN A don L sock. MAX A + RW chair<>BSC t/f. Anticipate MAX A x2 for perihygiene in standing. Pt was motivated t/o OT session and return demonstrated PROM exercises with VCs for technique. Pt would benefit from skilled OT to address noted impairments and functional limitations (see below for any additional details) in order to maximize safety and independence while minimizing falls risk and caregiver burden.  Upon hospital discharge, recommend pt discharge to CIR to maximize safety and return to PLOF.     Follow Up Recommendations  CIR    Equipment Recommendations  Other (comment) (to de determined at next venue of care)    Recommendations for Other Services       Precautions / Restrictions Precautions Precautions: Fall Restrictions Weight Bearing Restrictions: No      Mobility Bed Mobility Overal bed mobility: Needs Assistance Bed Mobility: Supine to Sit;Sit to Supine     Supine to sit: Mod assist Sit to supine: Max assist         Transfers Overall transfer level: Needs assistance Equipment used: Rolling walker (2 wheeled) Transfers: Sit to/from [facility] Sit to Stand: Max assist;From elevated surface Stand pivot transfers: Max assist;+2 safety/equipment            Balance Overall balance assessment: Needs assistance Sitting-balance support: Bilateral upper extremity supported;Feet supported Sitting balance-Leahy Scale: Poor   Postural control: Posterior lean;Right lateral lean Standing balance support: Bilateral upper extremity supported Standing balance-Leahy Scale: Zero                             ADL either performed or assessed with clinical judgement   ADL Overall ADL's : Needs assistance/impaired                                       General ADL Comments: MAX A don R sock at bed level, MIN A don L sock. MAX A + RW chair<>BSC t/f. Anticipate MAX A x2 for perihygiene in standing.     Vision Baseline Vision/History:  (Residual L lateral visual field deficits from prior CVA)              Pertinent Vitals/Pain Pain Assessment: No/denies pain     Hand Dominance Right   Extremity/Trunk Assessment Upper Extremity Assessment Upper Extremity Assessment: RUE deficits/detail RUE Deficits / Details: 4/5 grossly. Decreased Floris RUE Coordination: decreased fine motor   Lower Extremity Assessment Lower Extremity Assessment: RLE deficits/detail RLE Deficits / Details:  Increased tone, 1/5 grossly       Communication Communication Communication: No difficulties   Cognition Arousal/Alertness: Awake/alert Behavior During Therapy: WFL for tasks assessed/performed Overall Cognitive Status: Within Functional Limits for tasks assessed                                 General Comments: Poor insight into deficits   General Comments       Exercises Exercises: Other exercises Other Exercises Other Exercises: Pt educated re: OT role,  d/c recs, HEP, falls prevention Other Exercises: LBD, sup<>sit, sit<>stand, SPT, sitting/standing balance/tolerance   Shoulder Instructions      Home Living Family/patient expects to be discharged to:: Private residence Living Arrangements: Spouse/significant other (DTR visiting from [place] due to pt's illness) Available Help at Discharge: Family;Available 24 hours/day;Personal care attendant;Available PRN/intermittently (Pt and PCA caregive for husbnad - unclear if he can provide much physical assistance)   Home Access: Stairs to enter Entrance Stairs-Number of Steps: 5 steps to enter, or full flight from garage.   Home Layout: Multi-level;Able to live on main level with bedroom/bathroom Alternate Level Stairs-Number of Steps: garage is in basement full flight to main level; could enter at front door (5 steps with railings)             Home Equipment: Hospital bed (eqipment for her husband)          Prior Functioning/Environment Level of Independence: Independent        Comments: Caregiver of her husband        OT Problem List: Decreased strength;Decreased range of motion;Decreased activity tolerance;Impaired balance (sitting and/or standing);Decreased safety awareness;Decreased knowledge of use of DME or AE      OT Treatment/Interventions: Self-care/ADL training;Therapeutic exercise;Energy conservation;DME and/or AE instruction;Therapeutic activities;Patient/family education;Balance training    OT Goals(Current goals can be found in the care plan section) Acute Rehab OT Goals Patient Stated Goal: To return to caregiving for her husband OT Goal Formulation: With patient Time For Goal Achievement: 01/10/21 Potential to Achieve Goals: Good ADL Goals Pt Will Perform Grooming: with set-up;with supervision;sitting Pt Will Perform Lower Body Dressing: sit to/from stand;with mod assist (c LRAD PRN) Pt Will Transfer to Toilet: with min assist;stand pivot transfer;bedside commode  (c LRAD PRN)  OT Frequency: Min 3X/week           Co-evaluation PT/OT/SLP Co-Evaluation/Treatment: Yes Reason for Co-Treatment: Necessary to address cognition/behavior during functional activity;For patient/therapist safety;To address functional/ADL transfers PT goals addressed during session: Mobility/safety with mobility;Balance;Proper use of DME OT goals addressed during session: ADL's and self-care;Proper use of Adaptive equipment and DME      AM-PAC OT "6 Clicks" Daily Activity     Outcome Measure Help from another person eating meals?: A Little Help from another person taking care of personal grooming?: A Lot Help from another person toileting, which includes using toliet, bedpan, or urinal?: A Lot Help from another person bathing (including washing, rinsing, drying)?: A Lot Help from another person to put on and taking off regular upper body clothing?: A Lot Help from another person to put on and taking off regular lower body clothing?: A Lot 6 Click Score: 13   End of Session Equipment Utilized During Treatment: Rolling walker;Gait belt Nurse Communication: Mobility status  Activity Tolerance: Patient tolerated treatment well Patient left: in bed;with call bell/phone within reach  OT Visit Diagnosis: Other abnormalities of gait and mobility (R26.89);Muscle weakness (generalized) (M62.81);Hemiplegia and hemiparesis  Hemiplegia - Right/Left: Right Hemiplegia - dominant/non-dominant: Dominant Hemiplegia - caused by: Cerebral infarction                Time: 1791-5056 OT Time Calculation (min): 33 min Charges:  OT General Charges $OT Visit: 1 Visit OT Evaluation $OT Eval Moderate Complexity: 1 Mod OT Treatments $Self Care/Home Management : 8-22 mins  Dessie Coma, M.S. OTR/L  12/27/20, 12:14 PM  ascom [phone]

## 2020-12-27 NOTE — Evaluation (Signed)
Physical Therapy Evaluation Patient Details Name: [name] MRN: [id number] DOB: 1951-04-09 Today's Date: 12/27/2020   History of Present Illness  [name] is a 29yoF who comes [facility]  on 1/24 with Rt hemi weakness Leg > Arm. Small acute infarct within the anterior right frontal lobe seen on MRI imaging. MD in room also mentions additional midline infarct on left better correlates with pt symptoms. PMH: CVA c left field deficits, DVT and antiphospholipid antibody syndrome on coumadin, CKD3b, HTN, HLD, hypoTSH, depression.  Clinical Impression  Pt admitted with above diagnosis. Pt currently with functional limitations due to the deficits listed below (see "PT Problem List"). At evaluation, pt is received semirecumbent in bed upon entry, no family/caregiver present. The pt is awake and agreeable to participate. No acute distress noted at this time. VSS during eval. Pt received on and remaining on room air throughout evaluation, with saturation WNL, no O2 at baseline at home. Patient's performance this date reveals decreased ability, independence, and tolerance in performing all basic mobility required for performance of activities of daily living. Pt requires additional DME, close physical assistance, and cues for safe participate in mobility. Pt will benefit from skilled PT intervention to increase independence and safety with basic mobility in preparation for discharge to the venue listed below.    Cognition:The pt is alert and oriented x4, pleasant, conversational, and following simple and multi-step commands consistently, pt is a detailed historian. Pt does have difficulty with problem solving new motor patterns imposed by Rt hemi weakness. Strength: MMT Screening reveals focal impairment in the following areas: grossly 4/5 RUE; RLE grossly 1-2/5 or less (absent ankle A/ROM), hip ROM heavily imposed by new onset tone in adductors/extensors. SLR has ~1 inch clearance of Rt foot.  Functional mobility demonstrates heavy weakness, the pt now requiring Mod-Max Assist physical assistance for bed mobility and transfers, unable to attempt gait, whereas the patient performs these independently at baseline.   Tone: Tone graded using the modified Ashworth Scale reveals the follow: 2/4 Rt calf, 2/4 Right hip extensors Joint Proprioception: impaired Light Touch Sensation: appears intact  Dysmetria: not tested  Dysdiadochokinesia: not tested Visual Perception: not tested, has baseline field deficits from prior CVA.  Balance: poor, unable to manage balance with 2UE and LLE only, constant Rt lean with gradual fatigue and collapse over 1-2 minutes.  Speech: No apparent difficulty, abnormality.  Pt will benefit from skilled PT intervention to increase independence and safety with basic mobility in preparation for discharge to the venue listed below.      Follow Up Recommendations CIR;Supervision for mobility/OOB    Equipment Recommendations  None recommended by PT    Recommendations for Other Services Rehab consult     Precautions / Restrictions Precautions Precautions: Fall Restrictions Weight Bearing Restrictions: No      Mobility  Bed Mobility Overal bed mobility: Needs Assistance Bed Mobility: Supine to Sit;Sit to Supine     Supine to sit: Mod assist Sit to supine: Max assist        Transfers Overall transfer level: Needs assistance Equipment used: Rolling walker (2 wheeled) Transfers: Sit to/from [facility] Sit to Stand: Max assist;From elevated surface Stand pivot transfers: Max assist;+2 safety/equipment       General transfer comment: hip and ankle tone complicating management of RLE, poor compensation while up, poor capacity to perform trunk righting.  Ambulation/Gait                Stairs  Wheelchair Mobility    Modified Rankin (Stroke Patients Only)       Balance Overall balance assessment: Needs  assistance Sitting-balance support: Bilateral upper extremity supported;Feet supported Sitting balance-Leahy Scale: Poor   Postural control: Posterior lean;Right lateral lean Standing balance support: Bilateral upper extremity supported Standing balance-Leahy Scale: Zero                               Pertinent Vitals/Pain Pain Assessment: No/denies pain    Home Living Family/patient expects to be discharged to:: Private residence Living Arrangements: Spouse/significant other (DTR visiting from [place] due to pt's illness) Available Help at Discharge: Family;Available 24 hours/day;Personal care attendant;Available PRN/intermittently (Pt and PCA caregive for husbnad - unclear if he can provide much physical assistance)   Home Access: Stairs to enter   Entrance Stairs-Number of Steps: 5 steps to enter, or full flight from garage. 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 Decreased [place] RUE Coordination: decreased fine motor    Lower Extremity Assessment Lower Extremity Assessment: RLE deficits/detail;Generalized weakness RLE Deficits / Details: 0/5 ankle; 3/5 SLR, absent heel raise; PROM reveals tone in ankle extensors, ton in hip extensors 2/4 especially after 90 degrees of hip flexion RLE Sensation: WNL RLE Coordination: decreased gross motor       Communication   Communication: No difficulties  Cognition Arousal/Alertness: Awake/alert Behavior During Therapy: WFL for tasks assessed/performed Overall Cognitive Status: Within Functional Limits for tasks assessed                                  General Comments: Poor insight into deficits; poor problem solving related to spacial awareness limitations; has baseline visual field deficits from prior CVA      General Comments      Exercises Other Exercises Other Exercises: Pt educated re: OT role, d/c recs, HEP, falls prevention Other Exercises: LBD, sup<>sit, sit<>stand, SPT, sitting/standing balance/tolerance   Assessment/Plan    PT Assessment Patient needs continued PT services  PT Problem List Decreased range of motion;Decreased strength;Decreased activity tolerance;Decreased balance;Decreased mobility;Decreased coordination       PT Treatment Interventions Balance training;Neuromuscular re-education;Gait training;DME instruction;Functional mobility training;Stair training;Therapeutic activities;Therapeutic exercise;Patient/family education    PT Goals (Current goals can be found in the Care Plan section)  Acute Rehab PT Goals Patient Stated Goal: To return to caregiving for her husband PT Goal Formulation: With patient Time For Goal Achievement: 01/10/21 Potential to Achieve Goals: Fair    Frequency 7X/week   Barriers to discharge Decreased caregiver support husband unable to provide assist due to medical issues    Co-evaluation   Reason for Co-Treatment: Necessary to address cognition/behavior during functional activity;For patient/therapist safety;To address functional/ADL transfers PT goals addressed during session: Mobility/safety with mobility;Balance;Proper use of DME OT goals addressed during session: ADL's and self-care;Proper use of Adaptive equipment and DME       AM-PAC PT "6 Clicks" Mobility  Outcome Measure Help needed turning from your back to your side while in a flat  bed without using bedrails?: A Lot Help needed moving from lying on your back to sitting on the side of a flat bed without using bedrails?: A Lot Help needed moving to and from a bed to a chair (including a  wheelchair)?: A Lot Help needed standing up from a chair using your arms (e.g., wheelchair or bedside chair)?: A Lot Help needed to walk in hospital room?: Total Help needed climbing 3-5 steps with a railing? : Total 6 Click Score: 10    End of Session Equipment Utilized During Treatment: Gait belt Activity Tolerance: Patient tolerated treatment well;No increased pain Patient left: in bed;with call bell/phone within reach;with nursing/sitter in room Nurse Communication: Mobility status PT Visit Diagnosis: Difficulty in walking, not elsewhere classified (R26.2);Muscle weakness (generalized) (M62.81);Other symptoms and signs involving the nervous system (R29.898)    Time: 1829-9371 PT Time Calculation (min) (ACUTE ONLY): 40 min   Charges:   PT Evaluation $PT Eval Moderate Complexity: 1 Mod PT Treatments $Neuromuscular Re-education: 23-37 mins        1:23 PM, 12/27/20 Etta Grandchild, PT, DPT Physical Therapist - [facility]  [phone] ([place])    Bless Belshe C 12/27/2020, 1:16 PM

## 2020-12-28 ENCOUNTER — Other Ambulatory Visit: Payer: Self-pay

## 2020-12-28 ENCOUNTER — Encounter: Payer: Self-pay | Admitting: Internal Medicine

## 2020-12-28 DIAGNOSIS — D6861 Antiphospholipid syndrome: Secondary | ICD-10-CM | POA: Diagnosis not present

## 2020-12-28 DIAGNOSIS — I639 Cerebral infarction, unspecified: Secondary | ICD-10-CM | POA: Diagnosis not present

## 2020-12-28 DIAGNOSIS — D696 Thrombocytopenia, unspecified: Secondary | ICD-10-CM | POA: Diagnosis not present

## 2020-12-28 DIAGNOSIS — Z7901 Long term (current) use of anticoagulants: Secondary | ICD-10-CM | POA: Diagnosis not present

## 2020-12-28 LAB — PATHOLOGIST SMEAR REVIEW

## 2020-12-28 LAB — TSH: TSH: 11.289 u[IU]/mL — ABNORMAL HIGH (ref 0.350–4.500)

## 2020-12-28 LAB — T4, FREE: Free T4: 0.85 ng/dL (ref 0.61–1.12)

## 2020-12-28 MED ORDER — ATORVASTATIN CALCIUM 20 MG PO TABS
80.0000 mg | ORAL_TABLET | Freq: Every day | ORAL | Status: DC
Start: 2020-12-29 — End: 2020-12-29
  Administered 2020-12-29: 80 mg via ORAL
  Filled 2020-12-28: qty 4

## 2020-12-28 NOTE — Consult Note (Signed)
[facility]  Date of admission:  12/26/2020  Inpatient day:  12/28/2020  Consulting physician: Dr Lorella Nimrod  Reason for Consultation:  Thrombocytopenia and patient needs to be on Coumadin  Chief Complaint: [name] is a 70 y.o. female with antiphospholipid antibody syndrome who was admitted through the emergency room with a right sided CVA.  HPI: The patient states she was diagnosed with antiphospholipid antibody syndrome in 2004.  She presented with a CVA and [place].  She was noted to have visual changes (left homonymous hemianopsia).  She was treated with heparin and then converted to Coumadin.  She was initially seen in consultation at [facility].  She was also seen by Dr. Precious Reel.  She states that she was not diagnosed with lupus but arthritis only.  Plan was for long-term Coumadin.    She has been followed by Dr. Otilio Miu.  INR in the recent past has ranged between 1.8-1.9.  INR on 12/12/2020 was 1.2.  She states that she has never had thrombocytopenia.  Coumadin dose has typically been 3 mg a day.  She knows recent Coumadin dosing was 3 mg 3 days a week and 2 mg 4 days a week.  She states that she has been quite involved with the care of her husband who is s/p a kidney transplant.  He is currently at [facility] hospitalized.  She states that she was cooking supper and suddenly had right leg weakness and collapsed on the couch.  She was unable to walk up.  EMS was contacted.  ER evaluation noted right leg weakness.  A code stroke was activated.  Decision was made for no TPA.  Head MRI and neck angio on 12/26/2020 revealed a small acute infarct in the anterior right frontal lobe.  There was no hemorrhage or mass-effect.  There are multiple old infarcts and chronic small vessel ischemia.  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  Bilateral lower extremity duplex on 12/27/2020 revealed no evidence  of DVT.    Echo on 12/27/2020 revealed an ejection fraction of 55-60%.  There were no wall motion abnormalities.  Left atrial size was mildly dilated.  Labs on admission revealed a hematocrit of 31.9, hemoglobin 10.6, MCV 99.4, platelets 86,000, WBC 6300 with an [place] of 4600.  Last CBC on 06/03/2020 revealed a hematocrit of 31.1, hemoglobin 10.7, MCV 96, platelets 239,000, white count 5200 with an ANC of 2900.  Additional labs included a PT of 15.1 and an INR of 1.2.  PTT was 79.  HIV testing was negative.  Creatinine was 1.68 with a follow-up of 1.46 on 12/27/2020.  Prior creatinine on 02/10/2020 was 1.59.  Review of labs dated 10/28/2019 revealed a beta-2 glycoprotein IgG 74, IgA 47 and IgM 81.  Phosphatidylserine IgG was > 100.  ANA was + on 03/23/2019 with dsDNA of 9 IU/mL (indeterminate) anticentromere 2 (< 1).  dsDNA was 13 (+) on 10/28/2019.  Peripheral smear revealed normal morphology of RBCs, WBCs and platelets.  There was no evidence of circulating blasts or schistocytes.  Symptomatically, she notes no improvement in her right lower extremity weakness.  Sensation is intact.  She also notes some mild right upper extremity weakness.  She denies any headache.  She has chronic visual changes s/p her first CVA in 2004.  She denies any new medications or herbal products.  She has not been on heparin since her initial CVA.   Past Medical  History:  Diagnosis Date  . Arthritis 04/02/1996  . Depression   . GERD (gastroesophageal reflux disease) 04/03/2019  . Hyperlipidemia   . Hypertension   . Renal insufficiency   . Stroke ([place])   . Thyroid disease     Past Surgical History:  Procedure Laterality Date  . CESAREAN SECTION    . CHOLECYSTECTOMY    . COLONOSCOPY  2012   repeat in 10 yrs  . KNEE ARTHROSCOPY W/ MENISCAL REPAIR Left     Family History  Problem Relation Age of Onset  . Stroke Father   . Breast cancer Maternal Grandmother   . Kidney cancer Mother   . Cancer Mother    . Arthritis Brother     Social History:  reports that she has never smoked. She has never used smokeless tobacco. She reports current alcohol use of about 1.0 standard drink of alcohol per week. She reports that she does not use drugs.  The patient denies any exposure to radiation or toxins.  Her husband is currently hospitalized at [facility].  He is status post renal transplant in 2017.  She has a daughter who lives in [place] has who has flown in because of her parents' health.  She retired after her first CVA.  She previously worked as a Orthoptist for [employer].  The patient lives in [place]. She is alone today.  Allergies:  Allergies  Allergen Reactions  . Penicillin G Rash    Medications Prior to Admission  Medication Sig Dispense Refill  . atorvastatin (LIPITOR) 10 MG tablet Take 1 tablet (10 mg total) by mouth daily. 90 tablet 1  . donepezil (ARICEPT) 10 MG tablet Take 1 tablet by mouth daily.    Marland Kitchen escitalopram (LEXAPRO) 20 MG tablet Take 1 tablet (20 mg total) by mouth daily. 90 tablet 0  . famotidine (PEPCID) 20 MG tablet Take 1 tablet by mouth daily. singh    . ferrous sulfate 325 (65 FE) MG EC tablet Take 1 tablet (325 mg total) by mouth daily with breakfast. (Patient taking differently: Take 325 mg by mouth at bedtime.) 30 tablet 3  . levothyroxine (SYNTHROID) 75 MCG tablet Take 1 tablet (75 mcg total) by mouth daily. 30 tablet 5  . losartan (COZAAR) 25 MG tablet Take 25 mg by mouth daily.    . montelukast (SINGULAIR) 10 MG tablet TAKE 1 TABLET BY MOUTH EVERYDAY AT BEDTIME 90 tablet 3  . Multiple Vitamins-Minerals (CENTRUM SILVER 50+WOMEN PO) Take 1 tablet by mouth daily.    . solifenacin (VESICARE) 5 MG tablet Take 5 mg by mouth daily.    Marland Kitchen torsemide (DEMADEX) 5 MG tablet Take 1 tablet by mouth daily. singh    . traZODone (DESYREL) 50 MG tablet Take 1 tablet (50 mg total) by mouth daily. 30 tablet 5  . vitamin B-12 (CYANOCOBALAMIN) 1000 MCG tablet Take 1,000 mcg by mouth daily.     Marland Kitchen warfarin (COUMADIN) 2 MG tablet TAKE 1 TABLET BY MOUTH EVERY DAY (Patient taking differently: Take 2 mg by mouth See admin instructions. Take 1 tablet on Tuesday, Thursday, Saturday and  Sunday.) 90 tablet 0  . warfarin (COUMADIN) 3 MG tablet TAKE 1 TABLET BY MOUTH EVERY DAY (Patient taking differently: Take 3 mg by mouth See admin instructions. Take 1 tablet on Monday, Wednesday, and Friday.) 90 tablet 0  . mupirocin ointment (BACTROBAN) 2 % Place 1 application into the nose 2 (two) times daily. 22 g 0    Review of Systems  Constitutional: Negative for chills, fever, malaise/fatigue and weight loss.       Feels tired/rundown.  HENT: Negative for congestion, ear discharge, ear pain, hearing loss, nosebleeds, sinus pain, sore throat and tinnitus.   Eyes: Negative for blurred vision, double vision, photophobia and pain.       Chronic visual changes status post CVA.  Macular degeneration.  Respiratory: Positive for cough (dry). Negative for hemoptysis, sputum production and shortness of breath.   Cardiovascular: Negative.  Negative for chest pain, palpitations, orthopnea and leg swelling.  Gastrointestinal: Positive for diarrhea. Negative for abdominal pain, blood in stool, constipation, heartburn, melena, nausea and vomiting.  Genitourinary: Negative for dysuria, frequency, hematuria and urgency.       Incontinence issues (old).  Musculoskeletal: Negative.  Negative for back pain, joint pain, myalgias and neck pain.  Skin: Negative.  Negative for itching and rash.  Neurological: Positive for focal weakness (right lower extremity; slight weakness right upper extremity). Negative for dizziness, tingling, tremors, sensory change, speech change, seizures and headaches.  Endo/Heme/Allergies: Positive for environmental allergies.  Psychiatric/Behavioral: Negative for depression and memory loss. The patient is not nervous/anxious and does not have insomnia.        Stress secondary to husband's  heath.    Vitals:  Blood pressure (!) 147/94, pulse 75, temperature 98.2 F (36.8 C), resp. rate 16, height 5\' 8"  (1.727 m), weight 187 lb 6.3 oz (85 kg), SpO2 98 %.   Physical Exam Vitals and nursing note reviewed.  Constitutional:      General: She is not in acute distress.    Appearance: Normal appearance. She is not ill-appearing, toxic-appearing or diaphoretic.  HENT:     Head: Normocephalic and atraumatic.     Comments: White hair.    Mouth/Throat:     Mouth: Mucous membranes are moist.     Pharynx: Oropharynx is clear. No posterior oropharyngeal erythema.  Eyes:     General: No scleral icterus.    Extraocular Movements: Extraocular movements intact.     Pupils: Pupils are equal, round, and reactive to light.     Comments: Glasses.  Left homonymous hemianopsia.  Cardiovascular:     Rate and Rhythm: Normal rate and regular rhythm.  Pulmonary:     Effort: Pulmonary effort is normal.     Breath sounds: Normal breath sounds. No wheezing, rhonchi or rales.  Abdominal:     General: Abdomen is flat. There is no distension.     Palpations: Abdomen is soft. There is no mass.     Tenderness: There is no abdominal tenderness. There is no guarding or rebound.  Musculoskeletal:        General: No swelling or tenderness.     Cervical back: Normal range of motion and neck supple.  Skin:    General: Skin is dry.     Coloration: Skin is not jaundiced or pale.     Findings: No bruising or rash.  Neurological:     Mental Status: She is alert and oriented to person, place, and time.     Comments: Dense right lower extremity paralysis.  RUE strength 4/5.  Psychiatric:        Mood and Affect: Mood normal.        Behavior: Behavior normal.        Thought Content: Thought content normal.        Judgment: Judgment normal.    Results for orders placed or performed during the hospital encounter of 12/26/20 (from the past 48  hour(s))  Urine Drug Screen, Qualitative     Status: None    Collection Time: 12/26/20 10:32 PM  Result Value Ref Range   Tricyclic, Ur Screen NONE DETECTED NONE DETECTED   Amphetamines, Ur Screen NONE DETECTED NONE DETECTED   MDMA (Ecstasy)Ur Screen NONE DETECTED NONE DETECTED   Cocaine Metabolite,Ur [place] NONE DETECTED NONE DETECTED   Opiate, Ur Screen NONE DETECTED NONE DETECTED   Phencyclidine (PCP) Ur S NONE DETECTED NONE DETECTED   Cannabinoid 50 Ng, Ur [place] NONE DETECTED NONE DETECTED   Barbiturates, Ur Screen NONE DETECTED NONE DETECTED   Benzodiazepine, Ur Scrn NONE DETECTED NONE DETECTED   Methadone Scn, Ur NONE DETECTED NONE DETECTED    Comment: (NOTE) Tricyclics + metabolites, urine    Cutoff 1000 ng/mL Amphetamines + metabolites, urine  Cutoff 1000 ng/mL MDMA (Ecstasy), urine              Cutoff 500 ng/mL Cocaine Metabolite, urine          Cutoff 300 ng/mL Opiate + metabolites, urine        Cutoff 300 ng/mL Phencyclidine (PCP), urine         Cutoff 25 ng/mL Cannabinoid, urine                 Cutoff 50 ng/mL Barbiturates + metabolites, urine  Cutoff 200 ng/mL Benzodiazepine, urine              Cutoff 200 ng/mL Methadone, urine                   Cutoff 300 ng/mL  The urine drug screen provides only a preliminary, unconfirmed analytical test result and should not be used for non-medical purposes. Clinical consideration and professional judgment should be applied to any positive drug screen result due to possible interfering substances. A more specific alternate chemical method must be used in order to obtain a confirmed analytical result. Gas chromatography / mass spectrometry (GC/MS) is the preferred confirm atory method. Performed at [facility], [address] [place], [place] [address]   Urinalysis, Routine w reflex microscopic Urine, Clean Catch     Status: Abnormal   Collection Time: 12/26/20 10:32 PM  Result Value Ref Range   Color, Urine STRAW (A) YELLOW   APPearance CLEAR (A) CLEAR   Specific Gravity, Urine  1.004 (L) 1.005 - 1.030   pH 7.0 5.0 - 8.0   Glucose, UA NEGATIVE NEGATIVE mg/dL   Hgb urine dipstick NEGATIVE NEGATIVE   Bilirubin Urine NEGATIVE NEGATIVE   Ketones, ur NEGATIVE NEGATIVE mg/dL   Protein, ur NEGATIVE NEGATIVE mg/dL   Nitrite NEGATIVE NEGATIVE   Leukocytes,Ua NEGATIVE NEGATIVE    Comment: Performed at [facility], [address] [place], [place] [address]  HIV Antibody (routine testing w rflx)     Status: None   Collection Time: 12/27/20  3:50 AM  Result Value Ref Range   HIV Screen 4th Generation wRfx Non Reactive Non Reactive    Comment: Performed at [facility] [address], [place], [place] [address]  Hemoglobin A1c     Status: None   Collection Time: 12/27/20  3:50 AM  Result Value Ref Range   Hgb A1c MFr Bld 5.6 4.8 - 5.6 %    Comment: (NOTE) Pre diabetes:          5.7%-6.4%  Diabetes:              >6.4%  Glycemic control for   <7.0%  adults with diabetes    Mean Plasma Glucose 114.02 mg/dL    Comment: Performed at [facility] [address], [place], [place] [address]  Lipid panel     Status: None   Collection Time: 12/27/20  3:50 AM  Result Value Ref Range   Cholesterol 158 0 - 200 mg/dL   Triglycerides 77 <150 mg/dL   HDL 56 >40 mg/dL   Total CHOL/HDL Ratio 2.8 RATIO   VLDL 15 0 - 40 mg/dL   LDL Cholesterol 87 0 - 99 mg/dL    Comment:        Total Cholesterol/HDL:CHD Risk Coronary Heart Disease Risk Table                     Men   Women  1/2 Average Risk   3.4   3.3  Average Risk       5.0   4.4  2 X Average Risk   9.6   7.1  3 X Average Risk  23.4   11.0        Use the calculated Patient Ratio above and the CHD Risk Table to determine the patient's CHD Risk.        ATP III CLASSIFICATION (LDL):  <100     mg/dL   Optimal  100-129  mg/dL   Near or Above                    Optimal  130-159  mg/dL   Borderline  160-189  mg/dL   High  >190     mg/dL   Very High Performed at [facility], [address] [place], [place] [address]   CBC     Status: Abnormal   Collection Time: 12/27/20  3:50 AM  Result Value Ref Range   WBC 5.9 4.0 - 10.5 K/uL   RBC 3.31 (L) 3.87 - 5.11 MIL/uL   Hemoglobin 11.1 (L) 12.0 - 15.0 g/dL   HCT 32.6 (L) 36.0 - 46.0 %   MCV 98.5 80.0 - 100.0 fL   MCH 33.5 26.0 - 34.0 pg   MCHC 34.0 30.0 - 36.0 g/dL   RDW 12.0 11.5 - 15.5 %   Platelets 86 (L) 150 - 400 K/uL    Comment: Immature Platelet Fraction may be clinically indicated, consider ordering this additional test [id number]    nRBC 0.0 0.0 - 0.2 %    Comment: Performed at [facility], [address] [place], [place] [id number]  Basic metabolic panel     Status: Abnormal   Collection Time: 12/27/20  3:50 AM  Result Value Ref Range   Sodium 138 135 - 145 mmol/L   Potassium 3.7 3.5 - 5.1 mmol/L   Chloride 105 98 - 111 mmol/L   CO2 24 22 - 32 mmol/L   Glucose, Bld 114 (H) 70 - 99 mg/dL    Comment: Glucose reference range applies only to samples taken after fasting for at least 8 hours.   BUN 26 (H) 8 - 23 mg/dL   Creatinine, Ser 1.46 (H) 0.44 - 1.00 mg/dL   Calcium 9.3 8.9 - 10.3 mg/dL   GFR, Estimated 39 (L) >60 mL/min    Comment: (NOTE) Calculated using the CKD-EPI Creatinine Equation (2021)    Anion gap 9 5 - 15    Comment: Performed at [facility], [address] [place], [place] [address]  Pathologist smear review     Status: None  Collection Time: 12/28/20  3:50 AM  Result Value Ref Range   Path Review Blood smear is reviewed.     Comment: Morphology of RBCs, WBCs, and platelets within normal limits. No evidence of circulating blasts or schistocytes. Reviewed by Elmon Kirschner, M.D. Performed at [facility], [address] [place], [place] [address]   TSH     Status: Abnormal   Collection Time: 12/28/20  1:45 PM  Result Value Ref Range   TSH 11.289 (H) 0.350 - 4.500 uIU/mL    Comment: Performed by a 3rd Generation assay with a functional sensitivity  of <=0.01 uIU/mL. Performed at [facility], [address] [place], [place] [address]   T4, free     Status: None   Collection Time: 12/28/20  1:45 PM  Result Value Ref Range   Free T4 0.85 0.61 - 1.12 ng/dL    Comment: (NOTE) Biotin ingestion may interfere with free T4 tests. If the results are inconsistent with the TSH level, previous test results, or the clinical presentation, then consider biotin interference. If needed, order repeat testing after stopping biotin. Performed at [facility], [address] [place], [place] [address]    MR ANGIO HEAD WO CONTRAST  Result Date: 12/26/2020 CLINICAL DATA:  Acute neurologic deficits. EXAM: MRI HEAD WITHOUT CONTRAST MRA HEAD WITHOUT CONTRAST MRA NECK WITHOUT CONTRAST TECHNIQUE: Multiplanar, multiecho pulse sequences of the brain and surrounding structures were obtained without intravenous contrast. Angiographic images of the Circle of Willis were obtained using MRA technique without intravenous contrast. Angiographic images of the neck were obtained using MRA technique without intravenous contrast. Carotid stenosis measurements (when applicable) are obtained utilizing NASCET criteria, using the distal internal carotid diameter as the denominator. COMPARISON:  None. FINDINGS: MRI HEAD FINDINGS Brain: Small acute infarct within the anterior right frontal lobe. There are old infarcts of both cerebellar hemispheres, the left frontal lobe, the right parietal lobe and the right occipital lobe. No acute or chronic hemorrhage. There is multifocal hyperintense T2-weighted signal within the white matter. Generalized volume loss without a clear lobar predilection. The midline structures are normal. Vascular: Major flow voids are preserved. Skull and upper cervical spine: Normal calvarium and skull base. Visualized upper cervical spine and soft tissues are normal. Sinuses/Orbits:No paranasal sinus fluid levels or advanced mucosal thickening.  No mastoid or middle ear effusion. Normal orbits. MRA HEAD FINDINGS POSTERIOR CIRCULATION: --Vertebral arteries: Normal --Inferior cerebellar arteries: Normal. --Basilar artery: Normal. --Superior cerebellar arteries: Normal. --Posterior cerebral arteries: Distal occlusion of the right PCA is likely chronic given old PCA territory infarct. Moderate stenosis of the left P4 segment. ANTERIOR CIRCULATION: --Intracranial internal carotid arteries: Normal. --Anterior cerebral arteries (ACA): Normal. --Middle cerebral arteries (MCA): Normal. ANATOMIC VARIANTS: None MRA NECK FINDINGS Codominant vertebral arteries are normal along the entirety of the visualized portion. Time-of-flight imaging poorly images of the V1 and V3 segments. No carotid stenosis. IMPRESSION: 1. Small acute infarct within the anterior right frontal lobe. No hemorrhage or mass effect. 2. Multiple old infarcts and chronic small vessel ischemia. 3. Distal occlusion of the right PCA is likely chronic given old PCA territory infarct. 4. Moderate stenosis of the left P4 segment. 5. Time-of-flight MRA of the neck without hemodynamically significant stenosis. Electronically Signed   By: Ulyses Jarred M.D.   On: 12/26/2020 23:25   MR ANGIO NECK WO CONTRAST  Result Date: 12/26/2020 CLINICAL DATA:  Acute neurologic deficits. EXAM: MRI HEAD WITHOUT CONTRAST MRA HEAD WITHOUT CONTRAST MRA NECK WITHOUT CONTRAST  TECHNIQUE: Multiplanar, multiecho pulse sequences of the brain and surrounding structures were obtained without intravenous contrast. Angiographic images of the Circle of Willis were obtained using MRA technique without intravenous contrast. Angiographic images of the neck were obtained using MRA technique without intravenous contrast. Carotid stenosis measurements (when applicable) are obtained utilizing NASCET criteria, using the distal internal carotid diameter as the denominator. COMPARISON:  None. FINDINGS: MRI HEAD FINDINGS Brain: Small acute  infarct within the anterior right frontal lobe. There are old infarcts of both cerebellar hemispheres, the left frontal lobe, the right parietal lobe and the right occipital lobe. No acute or chronic hemorrhage. There is multifocal hyperintense T2-weighted signal within the white matter. Generalized volume loss without a clear lobar predilection. The midline structures are normal. Vascular: Major flow voids are preserved. Skull and upper cervical spine: Normal calvarium and skull base. Visualized upper cervical spine and soft tissues are normal. Sinuses/Orbits:No paranasal sinus fluid levels or advanced mucosal thickening. No mastoid or middle ear effusion. Normal orbits. MRA HEAD FINDINGS POSTERIOR CIRCULATION: --Vertebral arteries: Normal --Inferior cerebellar arteries: Normal. --Basilar artery: Normal. --Superior cerebellar arteries: Normal. --Posterior cerebral arteries: Distal occlusion of the right PCA is likely chronic given old PCA territory infarct. Moderate stenosis of the left P4 segment. ANTERIOR CIRCULATION: --Intracranial internal carotid arteries: Normal. --Anterior cerebral arteries (ACA): Normal. --Middle cerebral arteries (MCA): Normal. ANATOMIC VARIANTS: None MRA NECK FINDINGS Codominant vertebral arteries are normal along the entirety of the visualized portion. Time-of-flight imaging poorly images of the V1 and V3 segments. No carotid stenosis. IMPRESSION: 1. Small acute infarct within the anterior right frontal lobe. No hemorrhage or mass effect. 2. Multiple old infarcts and chronic small vessel ischemia. 3. Distal occlusion of the right PCA is likely chronic given old PCA territory infarct. 4. Moderate stenosis of the left P4 segment. 5. Time-of-flight MRA of the neck without hemodynamically significant stenosis. Electronically Signed   By: Ulyses Jarred M.D.   On: 12/26/2020 23:25   MR BRAIN WO CONTRAST  Result Date: 12/26/2020 CLINICAL DATA:  Acute neurologic deficits. EXAM: MRI HEAD  WITHOUT CONTRAST MRA HEAD WITHOUT CONTRAST MRA NECK WITHOUT CONTRAST TECHNIQUE: Multiplanar, multiecho pulse sequences of the brain and surrounding structures were obtained without intravenous contrast. Angiographic images of the Circle of Willis were obtained using MRA technique without intravenous contrast. Angiographic images of the neck were obtained using MRA technique without intravenous contrast. Carotid stenosis measurements (when applicable) are obtained utilizing NASCET criteria, using the distal internal carotid diameter as the denominator. COMPARISON:  None. FINDINGS: MRI HEAD FINDINGS Brain: Small acute infarct within the anterior right frontal lobe. There are old infarcts of both cerebellar hemispheres, the left frontal lobe, the right parietal lobe and the right occipital lobe. No acute or chronic hemorrhage. There is multifocal hyperintense T2-weighted signal within the white matter. Generalized volume loss without a clear lobar predilection. The midline structures are normal. Vascular: Major flow voids are preserved. Skull and upper cervical spine: Normal calvarium and skull base. Visualized upper cervical spine and soft tissues are normal. Sinuses/Orbits:No paranasal sinus fluid levels or advanced mucosal thickening. No mastoid or middle ear effusion. Normal orbits. MRA HEAD FINDINGS POSTERIOR CIRCULATION: --Vertebral arteries: Normal --Inferior cerebellar arteries: Normal. --Basilar artery: Normal. --Superior cerebellar arteries: Normal. --Posterior cerebral arteries: Distal occlusion of the right PCA is likely chronic given old PCA territory infarct. Moderate stenosis of the left P4 segment. ANTERIOR CIRCULATION: --Intracranial internal carotid arteries: Normal. --Anterior cerebral arteries (ACA): Normal. --Middle cerebral arteries (MCA): Normal. ANATOMIC VARIANTS: None MRA  NECK FINDINGS Codominant vertebral arteries are normal along the entirety of the visualized portion. Time-of-flight imaging  poorly images of the V1 and V3 segments. No carotid stenosis. IMPRESSION: 1. Small acute infarct within the anterior right frontal lobe. No hemorrhage or mass effect. 2. Multiple old infarcts and chronic small vessel ischemia. 3. Distal occlusion of the right PCA is likely chronic given old PCA territory infarct. 4. Moderate stenosis of the left P4 segment. 5. Time-of-flight MRA of the neck without hemodynamically significant stenosis. Electronically Signed   By: Ulyses Jarred M.D.   On: 12/26/2020 23:25   US Venous Img Lower Bilateral (DVT)  Result Date: 12/27/2020 CLINICAL DATA:  Bilateral leg pain EXAM: BILATERAL LOWER EXTREMITY VENOUS DOPPLER ULTRASOUND TECHNIQUE: Gray-scale sonography with graded compression, as well as color Doppler and duplex ultrasound were performed to evaluate the lower extremity deep venous systems from the level of the common femoral vein and including the common femoral, femoral, profunda femoral, popliteal and calf veins including the posterior tibial, peroneal and gastrocnemius veins when visible. The superficial great saphenous vein was also interrogated. Spectral Doppler was utilized to evaluate flow at rest and with distal augmentation maneuvers in the common femoral, femoral and popliteal veins. COMPARISON:  None. FINDINGS: RIGHT LOWER EXTREMITY Common Femoral Vein: No evidence of thrombus. Normal compressibility, respiratory phasicity and response to augmentation. Saphenofemoral Junction: No evidence of thrombus. Normal compressibility and flow on color Doppler imaging. Profunda Femoral Vein: No evidence of thrombus. Normal compressibility and flow on color Doppler imaging. Femoral Vein: No evidence of thrombus. Normal compressibility, respiratory phasicity and response to augmentation. Popliteal Vein: No evidence of thrombus. Normal compressibility, respiratory phasicity and response to augmentation. Calf Veins: No evidence of thrombus. Normal compressibility and flow on  color Doppler imaging. Superficial Great Saphenous Vein: No evidence of thrombus. Normal compressibility. Venous Reflux:  None. Other Findings:  None. LEFT LOWER EXTREMITY Common Femoral Vein: No evidence of thrombus. Normal compressibility, respiratory phasicity and response to augmentation. Saphenofemoral Junction: No evidence of thrombus. Normal compressibility and flow on color Doppler imaging. Profunda Femoral Vein: No evidence of thrombus. Normal compressibility and flow on color Doppler imaging. Femoral Vein: No evidence of thrombus. Normal compressibility, respiratory phasicity and response to augmentation. Popliteal Vein: No evidence of thrombus. Normal compressibility, respiratory phasicity and response to augmentation. Calf Veins: No evidence of thrombus. Normal compressibility and flow on color Doppler imaging. Superficial Great Saphenous Vein: No evidence of thrombus. Normal compressibility. Venous Reflux:  None. Other Findings:  None. IMPRESSION: No evidence of deep venous thrombosis in either lower extremity. Electronically Signed   By: Inez Catalina M.D.   On: 12/27/2020 11:58   ECHOCARDIOGRAM COMPLETE  Result Date: 12/27/2020    ECHOCARDIOGRAM REPORT   Patient Name:   MAERYN ETCHISON Date of Exam: 12/27/2020 Medical Rec #:  [id number]          Height:       68.0 in Accession #:    [id number]         Weight:       186.0 lb Date of Birth:  [birth year]         BSA:          1.982 m Patient Age:    40 years           BP:           162/80 mmHg Patient Gender: F  HR:           74 bpm. Exam Location:  [facility] Procedure: 2D Echo, Color Doppler, Cardiac Doppler and Intracardiac            Opacification Agent Indications:     I163.9 Stroke  History:         Patient has no prior history of Echocardiogram examinations.                  Risk Factors:Hypertension and Dyslipidemia.  Sonographer:     Humphrey Rolls RDCS (AE) Referring Phys:  [id number] [facility] AMIN Diagnosing Phys: Harold Hedge MD   Sonographer Comments: Suboptimal apical window. Image acquisition challenging due to patient body habitus. IMPRESSIONS  1. Left ventricular ejection fraction, by estimation, is 55 to 60%. The left ventricle has normal function. The left ventricle has no regional wall motion abnormalities. Left ventricular diastolic parameters are consistent with Grade I diastolic dysfunction (impaired relaxation).  2. Right ventricular systolic function is normal. The right ventricular size is normal.  3. Left atrial size was mildly dilated.  4. The mitral valve is grossly normal. Trivial mitral valve regurgitation.  5. The aortic valve was not well visualized. Aortic valve regurgitation is trivial. FINDINGS  Left Ventricle: Left ventricular ejection fraction, by estimation, is 55 to 60%. The left ventricle has normal function. The left ventricle has no regional wall motion abnormalities. Definity contrast agent was given IV to delineate the left ventricular  endocardial borders. The left ventricular internal cavity size was normal in size. There is no left ventricular hypertrophy. Left ventricular diastolic parameters are consistent with Grade I diastolic dysfunction (impaired relaxation). Right Ventricle: The right ventricular size is normal. No increase in right ventricular wall thickness. Right ventricular systolic function is normal. Left Atrium: Left atrial size was mildly dilated. Right Atrium: Right atrial size was normal in size. Pericardium: There is no evidence of pericardial effusion. Mitral Valve: The mitral valve is grossly normal. Trivial mitral valve regurgitation. MV peak gradient, 4.9 mmHg. The mean mitral valve gradient is 2.0 mmHg. Tricuspid Valve: The tricuspid valve is not well visualized. Tricuspid valve regurgitation is trivial. Aortic Valve: The aortic valve was not well visualized. Aortic valve regurgitation is trivial. Aortic valve mean gradient measures 2.0 mmHg. Aortic valve peak gradient measures 4.0  mmHg. Aortic valve area, by VTI measures 2.54 cm. Pulmonic Valve: The pulmonic valve was not well visualized. Pulmonic valve regurgitation is trivial. Aorta: The aortic root is normal in size and structure. IAS/Shunts: No atrial level shunt detected by color flow Doppler.  LEFT VENTRICLE PLAX 2D LVIDd:         4.60 cm  Diastology LVIDs:         3.20 cm  LV e' medial:    7.18 cm/s LV PW:         1.10 cm  LV E/e' medial:  10.8 LV IVS:        0.70 cm  LV e' lateral:   8.05 cm/s LVOT diam:     2.00 cm  LV E/e' lateral: 9.6 LV SV:         42 LV SV Index:   21 LVOT Area:     3.14 cm  RIGHT VENTRICLE RV Basal diam:  3.60 cm LEFT ATRIUM         Index      RIGHT ATRIUM           Index LA diam:    4.30 cm 2.17 cm/m RA Area:  16.00 cm                                RA Volume:   37.90 ml  19.13 ml/m  AORTIC VALVE                   PULMONIC VALVE AV Area (Vmax):    2.13 cm    PV Vmax:       0.95 m/s AV Area (Vmean):   2.38 cm    PV Vmean:      66.500 cm/s AV Area (VTI):     2.54 cm    PV VTI:        0.217 m AV Vmax:           100.00 cm/s PV Peak grad:  3.6 mmHg AV Vmean:          59.900 cm/s PV Mean grad:  2.0 mmHg AV VTI:            0.166 m AV Peak Grad:      4.0 mmHg AV Mean Grad:      2.0 mmHg LVOT Vmax:         67.90 cm/s LVOT Vmean:        45.400 cm/s LVOT VTI:          0.134 m LVOT/AV VTI ratio: 0.81  AORTA Ao Root diam: 2.90 cm MITRAL VALVE MV Area (PHT): 5.58 cm    SHUNTS MV Area VTI:   1.99 cm    Systemic VTI:  0.13 m MV Peak grad:  4.9 mmHg    Systemic Diam: 2.00 cm MV Mean grad:  2.0 mmHg MV Vmax:       1.11 m/s MV Vmean:      67.3 cm/s MV Decel Time: 136 msec MV E velocity: 77.60 cm/s MV A velocity: 96.40 cm/s MV E/A ratio:  0.80 Bartholome Bill MD Electronically signed by Bartholome Bill MD Signature Date/Time: 12/27/2020/4:38:53 PM    Final     Assessment:  The patient is a 70 y.o. woman with antiphospholipid antibody syndrome who was admitted with a CVA.  Admission INR was subtherapeutic (1.2).  Head  MRI and neck angio on 12/26/2020 revealed a small acute infarct in the anterior right frontal lobe.  There was no hemorrhage or mass-effect.  There are multiple old infarcts and chronic small vessel ischemia.  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  Bilateral lower extremity duplex on 12/27/2020 revealed no evidence of DVT.    Echo on 12/27/2020 revealed an ejection fraction of 55-60%.  There were no wall motion abnormalities.  Left atrial size was mildly dilated.  Peripheral smear revealed normal morphology of RBCs, WBCs and platelets.  There was no evidence of circulating blasts or schistocytes.  Symptomatically, she has dense right lower extremity paralysis. Platelet count is 86,000. Creatinine is 1.46.  Plan:   1.  Antiphospholipid antibody syndrome  Patient initially presented with a CVA in 2004.  Prior testing confirmed diagnosis..  She presented with a second CVA with a subtherapeutic INR.  Discuss the importance of anticoagulation secondary to the risk of arterial as well as venous thrombosis.   Long-term Coumadin recommended.  2.  Thrombocytopenia  By history, patient denies any prior history of thrombocytopenia.  She denies any new medications or herbal products.    She denies any recent heparin/lovenox (doubt HIT).  She  is HIV negative.  She denies any history of hepatitis.    Peripheral smear reveals no evidence of schistocytes (TTP).  Etiology is likely secondary to the antiphospholipid antibody syndrome as a result of binding of aPL to platelet associated associated phospholipids.  It possible that she may have ITP or thrombocytopenia related to another cause.  Additional testing ordered.  Discuss plan for anticoagulation.  Patient's can be safely anticoagulated with platelet counts > 50,000.  Anticipate reinitiation of Coumadin when cleared by neurology with a Lovenox/heparin bridge based on renal function.   Coumadin  alone without the bridge will result in a potential transient increase risk of thrombosis.   When on Lovenox/heparin will need to follow anti-factor Xa as patient has a baseline increased PTT secondary to APL.   Target INR 2.5-3.5 per neurology.  Agree with avoiding DOAC as less effective than warfarin for recurrent thrombosis.   Thank you for allowing me to participate in [place] 's care.  I will follow her closely with you while hospitalized and after discharge in the outpatient department.   Lequita Asal, MD  12/28/2020, 10:12 PM

## 2020-12-28 NOTE — Progress Notes (Signed)
PROGRESS NOTE    [name] ([facility])  [id number] DOB: [birth year] DOA: 12/26/2020 PCP: Juline Patch, MD   Brief Narrative: Taken from H&P [name] is a 70 y.o. female with medical history significant for history of CVA with residual left lateral visual field deficit, history of DVT and antiphospholipid antibody syndrome on Coumadin, CKD stage IIIb, hypertension, hyperlipidemia, hypothyroidism, depression, and mild cognitive impairment who presents to the ED for evaluation of right-sided weakness. Mostly involving the right leg.  On arrival she was hemodynamically stable, labs without any significant abnormality, UA negative for UTI, UDS negative, SARS-CoV-2 PCR negative. Initial CT head was without any acute findings and did show multiple old infarcts.  Family decided not to proceed with TPA.  MRI brain with multiple small infarcts with concern of embolic phenomena.  Patient with subtherapeutic INR on Coumadin can be infected.  Lower extremity DVT was negative for any clots.  Also shows multiple old infarcts and chronic small vessel ischemia.  MRI with distal occlusion of the right PCA noted and felt to be chronic given old PCA territory infarct.  Moderate stenosis of the left V4 segment also seen.  Neurology was consulted and she was not amenable for any procedure. PT/OT recommending CIR. after discussing with daughter patient is agreeable.  Subjective: Patient was sitting in chair comfortably when seen today.  Continue to have right leg weakness.  She was complaining about some problems with vision, stating that she cannot focus.  Per daughter this is being going on for few weeks and she has already started evaluation by ophthalmology.  Patient and daughter was also very concerned about her diarrhea which is going on for few weeks now.  They wanted to see a gastroenterologist.  Denies any abdominal pain, nausea or vomiting.  Discussed that they need to follow-up with GI as an  outpatient but we can order some basic stool studies.  Assessment & Plan:   Principal Problem:   Acute CVA (cerebrovascular accident) ([place]) Active Problems:   Adult hypothyroidism   Essential hypertension   Anticoagulant long-term use   Stage 3 chronic kidney disease (HCC)   Thrombocytopenia (HCC)   CVA (cerebral vascular accident) ([place])  Acute CVA with history of stroke: Multiple small acute infarcts within the anterior right frontal lobe , is small infarct in the parasagittal left frontal region.  More consistent with embolic phenomena secondary to subtherapeutic INR.  Patient with history of DVT and antiphospholipid syndrome.  His Coumadin is being held by neurology for 3 days, can resume on 12/30/2020.  Goal INR should be a little higher at 2.5-3.5.  to prevent further stroke. Passed swallow evaluation. A1c of 5.6,  Lipid panel with LDL of 87-neurology recommending atorvastatin for goal LDL less than 70. Echocardiogram without any significant abnormality. Per neurology no need for TEE as she will be on anticoagulation. PT/OT are recommending CIR-pending rehab evaluation.  Thrombocytopenia.  New finding, remained stable at 12.  Platelets were 239 65-month ago.  No sign of bleeding -Surgery was consulted today-Dr. Mike Gip will see her later today.  History of DVT and antiphospholipid antibody syndrome on Coumadin: INR subtherapeutic at 1.2 on arrival.  Coumadin on hold per neurology for 3 days.  Can resume on 12/30/2020. Lower extremity Doppler without any DVT.  Diarrhea.  Patient is having diarrhea with 3-4 loose bowel movements daily for the past few weeks.  No abdominal pain, nausea or vomiting.  No leukocytosis. She wants to see a GI.  Which can be  done as an outpatient.  -Ordered basic stool studies which include GI pathogen panel, fecal lactoferrin, TSH and free T4.  Hypertension: Blood pressure within goal. Allow permissive hypertension for now. -We will resume home meds  from tomorrow.  CKD stage IIIb.  Creatinine seems stable and around her baseline today. -Continue to monitor -Avoid nephrotoxins  Hypothyroidism: Continue Synthroid.  Hyperlipidemia: Continue atorvastatin.  Depression: Continue Lexapro.  Mild cognitive impairment: Continue Aricept.  Objective: Vitals:   12/28/20 0021 12/28/20 0558 12/28/20 0723 12/28/20 1112  BP: 131/67 134/72 (!) 145/66 128/78  Pulse: 72 75 69 80  Resp: 16 16 18 16   Temp: 97.8 F (36.6 C) (!) 97.5 F (36.4 C) 97.9 F (36.6 C) 98.4 F (36.9 C)  TempSrc: Oral Oral Oral Oral  SpO2: 96% 99% 97% 100%  Weight:      Height:        Intake/Output Summary (Last 24 hours) at 12/28/2020 1325 Last data filed at 12/28/2020 0945 Gross per 24 hour  Intake 120 ml  Output 600 ml  Net -480 ml   Filed Weights   12/27/20 2102  Weight: 85 kg    Examination:  General.  Well-developed lady, in no acute distress. Pulmonary.  Lungs clear bilaterally, normal respiratory effort. CV.  Regular rate and rhythm, no JVD, rub or murmur. Abdomen.  Soft, nontender, nondistended, BS positive. CNS.  Alert and oriented x3.  3+ to 4/5 strength in right lower extremity. Extremities.  No edema, no cyanosis, pulses intact and symmetrical. Psychiatry.  Judgment and insight appears normal.  DVT prophylaxis: SCDs as patient has thrombocytopenia Code Status: Full Family Communication: Discussed with daughter at bedside. Disposition Plan:  Status is: Inpatient  Remains inpatient appropriate because:Inpatient level of care appropriate due to severity of illness   Dispo: The patient is from: Home              Anticipated d/c is to: CIR              Anticipated d/c date is: 1 day              Patient currently is medically stable to d/c.   Difficult to place patient No   Consultants:   Neurology  Procedures:  Antimicrobials:   Data Reviewed: I have personally reviewed following labs and imaging studies  CBC: Recent  Labs  Lab 12/26/20 1953 12/27/20 0350  WBC 6.3 5.9  NEUTROABS 4.6  --   HGB 10.6* 11.1*  HCT 31.9* 32.6*  MCV 99.4 98.5  PLT 86* 86*   Basic Metabolic Panel: Recent Labs  Lab 12/26/20 1953 12/27/20 0350  NA 138 138  K 3.9 3.7  CL 102 105  CO2 24 24  GLUCOSE 102* 114*  BUN 30* 26*  CREATININE 1.68* 1.46*  CALCIUM 9.6 9.3   GFR: Estimated Creatinine Clearance: 41.5 mL/min (A) (by C-G formula based on SCr of 1.46 mg/dL (H)). Liver Function Tests: Recent Labs  Lab 12/26/20 1953  AST 22  ALT 16  ALKPHOS 65  BILITOT 1.0  PROT 7.2  ALBUMIN 4.0   No results for input(s): LIPASE, AMYLASE in the last 168 hours. No results for input(s): AMMONIA in the last 168 hours. Coagulation Profile: Recent Labs  Lab 12/26/20 1953  INR 1.2   Cardiac Enzymes: No results for input(s): CKTOTAL, CKMB, CKMBINDEX, TROPONINI in the last 168 hours. BNP (last 3 results) No results for input(s): PROBNP in the last 8760 hours. HbA1C: Recent Labs    12/27/20 0350  HGBA1C 5.6   CBG: Recent Labs  Lab 12/26/20 1945  GLUCAP 33   Lipid Profile: Recent Labs    12/27/20 0350  CHOL 158  HDL 56  LDLCALC 87  TRIG 77  CHOLHDL 2.8   Thyroid Function Tests: No results for input(s): TSH, T4TOTAL, FREET4, T3FREE, THYROIDAB in the last 72 hours. Anemia Panel: No results for input(s): VITAMINB12, FOLATE, FERRITIN, TIBC, IRON, RETICCTPCT in the last 72 hours. Sepsis Labs: No results for input(s): PROCALCITON, LATICACIDVEN in the last 168 hours.  Recent Results (from the past 240 hour(s))  SARS Coronavirus 2 by RT PCR (hospital order, performed in [facility] hospital lab) Nasopharyngeal Nasopharyngeal Swab     Status: None   Collection Time: 12/26/20  7:58 PM   Specimen: Nasopharyngeal Swab  Result Value Ref Range Status   SARS Coronavirus 2 NEGATIVE NEGATIVE Final    Comment: (NOTE) SARS-CoV-2 target nucleic acids are NOT DETECTED.  The SARS-CoV-2 RNA is generally detectable in  upper and lower respiratory specimens during the acute phase of infection. The lowest concentration of SARS-CoV-2 viral copies this assay can detect is 250 copies / mL. A negative result does not preclude SARS-CoV-2 infection and should not be used as the sole basis for treatment or other patient management decisions.  A negative result may occur with improper specimen collection / handling, submission of specimen other than nasopharyngeal swab, presence of viral mutation(s) within the areas targeted by this assay, and inadequate number of viral copies (<250 copies / mL). A negative result must be combined with clinical observations, patient history, and epidemiological information.  Fact Sheet for Patients:   StrictlyIdeas.no  Fact Sheet for Healthcare Providers: BankingDealers.co.za  This test is not yet approved or  cleared by the [place] FDA and has been authorized for detection and/or diagnosis of SARS-CoV-2 by FDA under an Emergency Use Authorization (EUA).  This EUA will remain in effect (meaning this test can be used) for the duration of the COVID-19 declaration under Section 564(b)(1) of the Act, 21 U.S.C. section 360bbb-3(b)(1), unless the authorization is terminated or revoked sooner.  Performed at [facility], [address] [place], [place] [address]      Radiology Studies: MR ANGIO HEAD WO CONTRAST  Result Date: 12/26/2020 CLINICAL DATA:  Acute neurologic deficits. EXAM: MRI HEAD WITHOUT CONTRAST MRA HEAD WITHOUT CONTRAST MRA NECK WITHOUT CONTRAST TECHNIQUE: Multiplanar, multiecho pulse sequences of the brain and surrounding structures were obtained without intravenous contrast. Angiographic images of the Circle of Willis were obtained using MRA technique without intravenous contrast. Angiographic images of the neck were obtained using MRA technique without intravenous contrast. Carotid stenosis  measurements (when applicable) are obtained utilizing NASCET criteria, using the distal internal carotid diameter as the denominator. COMPARISON:  None. FINDINGS: MRI HEAD FINDINGS Brain: Small acute infarct within the anterior right frontal lobe. There are old infarcts of both cerebellar hemispheres, the left frontal lobe, the right parietal lobe and the right occipital lobe. No acute or chronic hemorrhage. There is multifocal hyperintense T2-weighted signal within the white matter. Generalized volume loss without a clear lobar predilection. The midline structures are normal. Vascular: Major flow voids are preserved. Skull and upper cervical spine: Normal calvarium and skull base. Visualized upper cervical spine and soft tissues are normal. Sinuses/Orbits:No paranasal sinus fluid levels or advanced mucosal thickening. No mastoid or middle ear effusion. Normal orbits. MRA HEAD FINDINGS POSTERIOR CIRCULATION: --Vertebral arteries: Normal --Inferior cerebellar arteries: Normal. --Basilar artery: Normal. --Superior cerebellar arteries: Normal. --Posterior  cerebral arteries: Distal occlusion of the right PCA is likely chronic given old PCA territory infarct. Moderate stenosis of the left P4 segment. ANTERIOR CIRCULATION: --Intracranial internal carotid arteries: Normal. --Anterior cerebral arteries (ACA): Normal. --Middle cerebral arteries (MCA): Normal. ANATOMIC VARIANTS: None MRA NECK FINDINGS Codominant vertebral arteries are normal along the entirety of the visualized portion. Time-of-flight imaging poorly images of the V1 and V3 segments. No carotid stenosis. IMPRESSION: 1. Small acute infarct within the anterior right frontal lobe. No hemorrhage or mass effect. 2. Multiple old infarcts and chronic small vessel ischemia. 3. Distal occlusion of the right PCA is likely chronic given old PCA territory infarct. 4. Moderate stenosis of the left P4 segment. 5. Time-of-flight MRA of the neck without hemodynamically  significant stenosis. Electronically Signed   By: Ulyses Jarred M.D.   On: 12/26/2020 23:25   MR ANGIO NECK WO CONTRAST  Result Date: 12/26/2020 CLINICAL DATA:  Acute neurologic deficits. EXAM: MRI HEAD WITHOUT CONTRAST MRA HEAD WITHOUT CONTRAST MRA NECK WITHOUT CONTRAST TECHNIQUE: Multiplanar, multiecho pulse sequences of the brain and surrounding structures were obtained without intravenous contrast. Angiographic images of the Circle of Willis were obtained using MRA technique without intravenous contrast. Angiographic images of the neck were obtained using MRA technique without intravenous contrast. Carotid stenosis measurements (when applicable) are obtained utilizing NASCET criteria, using the distal internal carotid diameter as the denominator. COMPARISON:  None. FINDINGS: MRI HEAD FINDINGS Brain: Small acute infarct within the anterior right frontal lobe. There are old infarcts of both cerebellar hemispheres, the left frontal lobe, the right parietal lobe and the right occipital lobe. No acute or chronic hemorrhage. There is multifocal hyperintense T2-weighted signal within the white matter. Generalized volume loss without a clear lobar predilection. The midline structures are normal. Vascular: Major flow voids are preserved. Skull and upper cervical spine: Normal calvarium and skull base. Visualized upper cervical spine and soft tissues are normal. Sinuses/Orbits:No paranasal sinus fluid levels or advanced mucosal thickening. No mastoid or middle ear effusion. Normal orbits. MRA HEAD FINDINGS POSTERIOR CIRCULATION: --Vertebral arteries: Normal --Inferior cerebellar arteries: Normal. --Basilar artery: Normal. --Superior cerebellar arteries: Normal. --Posterior cerebral arteries: Distal occlusion of the right PCA is likely chronic given old PCA territory infarct. Moderate stenosis of the left P4 segment. ANTERIOR CIRCULATION: --Intracranial internal carotid arteries: Normal. --Anterior cerebral arteries  (ACA): Normal. --Middle cerebral arteries (MCA): Normal. ANATOMIC VARIANTS: None MRA NECK FINDINGS Codominant vertebral arteries are normal along the entirety of the visualized portion. Time-of-flight imaging poorly images of the V1 and V3 segments. No carotid stenosis. IMPRESSION: 1. Small acute infarct within the anterior right frontal lobe. No hemorrhage or mass effect. 2. Multiple old infarcts and chronic small vessel ischemia. 3. Distal occlusion of the right PCA is likely chronic given old PCA territory infarct. 4. Moderate stenosis of the left P4 segment. 5. Time-of-flight MRA of the neck without hemodynamically significant stenosis. Electronically Signed   By: Ulyses Jarred M.D.   On: 12/26/2020 23:25   MR BRAIN WO CONTRAST  Result Date: 12/26/2020 CLINICAL DATA:  Acute neurologic deficits. EXAM: MRI HEAD WITHOUT CONTRAST MRA HEAD WITHOUT CONTRAST MRA NECK WITHOUT CONTRAST TECHNIQUE: Multiplanar, multiecho pulse sequences of the brain and surrounding structures were obtained without intravenous contrast. Angiographic images of the Circle of Willis were obtained using MRA technique without intravenous contrast. Angiographic images of the neck were obtained using MRA technique without intravenous contrast. Carotid stenosis measurements (when applicable) are obtained utilizing NASCET criteria, using the distal internal carotid diameter as the  denominator. COMPARISON:  None. FINDINGS: MRI HEAD FINDINGS Brain: Small acute infarct within the anterior right frontal lobe. There are old infarcts of both cerebellar hemispheres, the left frontal lobe, the right parietal lobe and the right occipital lobe. No acute or chronic hemorrhage. There is multifocal hyperintense T2-weighted signal within the white matter. Generalized volume loss without a clear lobar predilection. The midline structures are normal. Vascular: Major flow voids are preserved. Skull and upper cervical spine: Normal calvarium and skull base.  Visualized upper cervical spine and soft tissues are normal. Sinuses/Orbits:No paranasal sinus fluid levels or advanced mucosal thickening. No mastoid or middle ear effusion. Normal orbits. MRA HEAD FINDINGS POSTERIOR CIRCULATION: --Vertebral arteries: Normal --Inferior cerebellar arteries: Normal. --Basilar artery: Normal. --Superior cerebellar arteries: Normal. --Posterior cerebral arteries: Distal occlusion of the right PCA is likely chronic given old PCA territory infarct. Moderate stenosis of the left P4 segment. ANTERIOR CIRCULATION: --Intracranial internal carotid arteries: Normal. --Anterior cerebral arteries (ACA): Normal. --Middle cerebral arteries (MCA): Normal. ANATOMIC VARIANTS: None MRA NECK FINDINGS Codominant vertebral arteries are normal along the entirety of the visualized portion. Time-of-flight imaging poorly images of the V1 and V3 segments. No carotid stenosis. IMPRESSION: 1. Small acute infarct within the anterior right frontal lobe. No hemorrhage or mass effect. 2. Multiple old infarcts and chronic small vessel ischemia. 3. Distal occlusion of the right PCA is likely chronic given old PCA territory infarct. 4. Moderate stenosis of the left P4 segment. 5. Time-of-flight MRA of the neck without hemodynamically significant stenosis. Electronically Signed   By: Ulyses Jarred M.D.   On: 12/26/2020 23:25   US Venous Img Lower Bilateral (DVT)  Result Date: 12/27/2020 CLINICAL DATA:  Bilateral leg pain EXAM: BILATERAL LOWER EXTREMITY VENOUS DOPPLER ULTRASOUND TECHNIQUE: Gray-scale sonography with graded compression, as well as color Doppler and duplex ultrasound were performed to evaluate the lower extremity deep venous systems from the level of the common femoral vein and including the common femoral, femoral, profunda femoral, popliteal and calf veins including the posterior tibial, peroneal and gastrocnemius veins when visible. The superficial great saphenous vein was also interrogated.  Spectral Doppler was utilized to evaluate flow at rest and with distal augmentation maneuvers in the common femoral, femoral and popliteal veins. COMPARISON:  None. FINDINGS: RIGHT LOWER EXTREMITY Common Femoral Vein: No evidence of thrombus. Normal compressibility, respiratory phasicity and response to augmentation. Saphenofemoral Junction: No evidence of thrombus. Normal compressibility and flow on color Doppler imaging. Profunda Femoral Vein: No evidence of thrombus. Normal compressibility and flow on color Doppler imaging. Femoral Vein: No evidence of thrombus. Normal compressibility, respiratory phasicity and response to augmentation. Popliteal Vein: No evidence of thrombus. Normal compressibility, respiratory phasicity and response to augmentation. Calf Veins: No evidence of thrombus. Normal compressibility and flow on color Doppler imaging. Superficial Great Saphenous Vein: No evidence of thrombus. Normal compressibility. Venous Reflux:  None. Other Findings:  None. LEFT LOWER EXTREMITY Common Femoral Vein: No evidence of thrombus. Normal compressibility, respiratory phasicity and response to augmentation. Saphenofemoral Junction: No evidence of thrombus. Normal compressibility and flow on color Doppler imaging. Profunda Femoral Vein: No evidence of thrombus. Normal compressibility and flow on color Doppler imaging. Femoral Vein: No evidence of thrombus. Normal compressibility, respiratory phasicity and response to augmentation. Popliteal Vein: No evidence of thrombus. Normal compressibility, respiratory phasicity and response to augmentation. Calf Veins: No evidence of thrombus. Normal compressibility and flow on color Doppler imaging. Superficial Great Saphenous Vein: No evidence of thrombus. Normal compressibility. Venous Reflux:  None. Other Findings:  None. IMPRESSION: No evidence of deep venous thrombosis in either lower extremity. Electronically Signed   By: Inez Catalina M.D.   On: 12/27/2020 11:58    ECHOCARDIOGRAM COMPLETE  Result Date: 12/27/2020    ECHOCARDIOGRAM REPORT   Patient Name:   [name] Date of Exam: 12/27/2020 Medical Rec #:  [id number]          Height:       68.0 in Accession #:    [id number]         Weight:       186.0 lb Date of Birth:  21-Jan-1951         BSA:          1.982 m Patient Age:    32 years           BP:           162/80 mmHg Patient Gender: F                  HR:           74 bpm. Exam Location:  [facility] Procedure: 2D Echo, Color Doppler, Cardiac Doppler and Intracardiac            Opacification Agent Indications:     I163.9 Stroke  History:         Patient has no prior history of Echocardiogram examinations.                  Risk Factors:Hypertension and Dyslipidemia.  Sonographer:     Charmayne Sheer RDCS (AE) Referring Phys:  [id number] [facility] [place] Diagnosing Phys: Bartholome Bill MD  Sonographer Comments: Suboptimal apical window. Image acquisition challenging due to patient body habitus. IMPRESSIONS  1. Left ventricular ejection fraction, by estimation, is 55 to 60%. The left ventricle has normal function. The left ventricle has no regional wall motion abnormalities. Left ventricular diastolic parameters are consistent with Grade I diastolic dysfunction (impaired relaxation).  2. Right ventricular systolic function is normal. The right ventricular size is normal.  3. Left atrial size was mildly dilated.  4. The mitral valve is grossly normal. Trivial mitral valve regurgitation.  5. The aortic valve was not well visualized. Aortic valve regurgitation is trivial. FINDINGS  Left Ventricle: Left ventricular ejection fraction, by estimation, is 55 to 60%. The left ventricle has normal function. The left ventricle has no regional wall motion abnormalities. Definity contrast agent was given IV to delineate the left ventricular  endocardial borders. The left ventricular internal cavity size was normal in size. There is no left ventricular hypertrophy. Left ventricular diastolic  parameters are consistent with Grade I diastolic dysfunction (impaired relaxation). Right Ventricle: The right ventricular size is normal. No increase in right ventricular wall thickness. Right ventricular systolic function is normal. Left Atrium: Left atrial size was mildly dilated. Right Atrium: Right atrial size was normal in size. Pericardium: There is no evidence of pericardial effusion. Mitral Valve: The mitral valve is grossly normal. Trivial mitral valve regurgitation. MV peak gradient, 4.9 mmHg. The mean mitral valve gradient is 2.0 mmHg. Tricuspid Valve: The tricuspid valve is not well visualized. Tricuspid valve regurgitation is trivial. Aortic Valve: The aortic valve was not well visualized. Aortic valve regurgitation is trivial. Aortic valve mean gradient measures 2.0 mmHg. Aortic valve peak gradient measures 4.0 mmHg. Aortic valve area, by VTI measures 2.54 cm. Pulmonic Valve: The pulmonic valve was not well visualized. Pulmonic valve regurgitation is trivial. Aorta: The aortic root is normal in size and  structure. IAS/Shunts: No atrial level shunt detected by color flow Doppler.  LEFT VENTRICLE PLAX 2D LVIDd:         4.60 cm  Diastology LVIDs:         3.20 cm  LV e' medial:    7.18 cm/s LV PW:         1.10 cm  LV E/e' medial:  10.8 LV IVS:        0.70 cm  LV e' lateral:   8.05 cm/s LVOT diam:     2.00 cm  LV E/e' lateral: 9.6 LV SV:         42 LV SV Index:   21 LVOT Area:     3.14 cm  RIGHT VENTRICLE RV Basal diam:  3.60 cm LEFT ATRIUM         Index      RIGHT ATRIUM           Index LA diam:    4.30 cm 2.17 cm/m RA Area:     16.00 cm                                RA Volume:   37.90 ml  19.13 ml/m  AORTIC VALVE                   PULMONIC VALVE AV Area (Vmax):    2.13 cm    PV Vmax:       0.95 m/s AV Area (Vmean):   2.38 cm    PV Vmean:      66.500 cm/s AV Area (VTI):     2.54 cm    PV VTI:        0.217 m AV Vmax:           100.00 cm/s PV Peak grad:  3.6 mmHg AV Vmean:          59.900 cm/s PV  Mean grad:  2.0 mmHg AV VTI:            0.166 m AV Peak Grad:      4.0 mmHg AV Mean Grad:      2.0 mmHg LVOT Vmax:         67.90 cm/s LVOT Vmean:        45.400 cm/s LVOT VTI:          0.134 m LVOT/AV VTI ratio: 0.81  AORTA Ao Root diam: 2.90 cm MITRAL VALVE MV Area (PHT): 5.58 cm    SHUNTS MV Area VTI:   1.99 cm    Systemic VTI:  0.13 m MV Peak grad:  4.9 mmHg    Systemic Diam: 2.00 cm MV Mean grad:  2.0 mmHg MV Vmax:       1.11 m/s MV Vmean:      67.3 cm/s MV Decel Time: 136 msec MV E velocity: 77.60 cm/s MV A velocity: 96.40 cm/s MV E/A ratio:  0.80 Bartholome Bill MD Electronically signed by Bartholome Bill MD Signature Date/Time: 12/27/2020/4:38:53 PM    Final    CT HEAD CODE STROKE WO CONTRAST  Result Date: 12/26/2020 CLINICAL DATA:  Code stroke.  Right arm and leg weakness EXAM: CT HEAD WITHOUT CONTRAST TECHNIQUE: Contiguous axial images were obtained from the base of the skull through the vertex without intravenous contrast. COMPARISON:  04/23/2012 FINDINGS: Brain: No acute finding by CT. Few old small vessel cerebellar infarctions. Old infarction in the right occipital lobe, at the  right parietal vertex, and at the left frontal convexity. Chronic small-vessel ischemic changes of the hemispheric white matter. No mass, hemorrhage, hydrocephalus or extra-axial collection. Vascular: There is atherosclerotic calcification of the major vessels at the base of the brain. Skull: Negative Sinuses/Orbits: Clear Other: None ASPECTS ([place] Stroke Program Early CT Score) - Ganglionic level infarction (caudate, lentiform nuclei, internal capsule, insula, M1-M3 cortex): 7, allowing for the old infarction. - Supraganglionic infarction (M4-M6 cortex): 3, allowing for the old infarction. Total score (0-10 with 10 being normal): 10, allowing for the old infarction. IMPRESSION: 1. No acute finding by CT. 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 Chronic small-vessel ischemic changes of the white matter. 2. ASPECTS is 10,  allowing for the old infarction. 3. These results were called by telephone at the time of interpretation on 12/26/2020 at 8:07 pm to provider Dr. Nickolas Madrid, Who verbally acknowledged these results. Electronically Signed   By: Nelson Chimes M.D.   On: 12/26/2020 20:09    Scheduled Meds: .  stroke: mapping our early stages of recovery book   Does not apply Once  . aspirin  81 mg Oral Daily  . [START ON 12/29/2020] atorvastatin  80 mg Oral Daily  . donepezil  10 mg Oral Daily  . escitalopram  20 mg Oral Daily  . famotidine  20 mg Oral Daily  . ferrous sulfate  325 mg Oral QHS  . levothyroxine  75 mcg Oral Q0600  . montelukast  10 mg Oral QHS  . traZODone  50 mg Oral QHS   Continuous Infusions:   LOS: 1 day   Time spent: 30 minutes.  Lorella Nimrod, MD [facility]  If 7PM-7AM, please contact night-coverage [email or web address]  12/28/2020, 1:25 PM   This record has been created using Systems analyst. Errors have been sought and corrected,but may not always be located. Such creation errors do not reflect on the standard of care.

## 2020-12-28 NOTE — TOC Progression Note (Signed)
Transition of Care [facility]) - Progression Note    Patient Details  Name: [name] MRN: [id number] Date of Birth: 09/06/1951  [facility] CM/SW Contact  Shelbie Ammons, RN Phone Number: 12/28/2020, 3:28 PM  Clinical Narrative:  RNCM received call from patient's daughter Ramiro Harvest with many questions about rehab and differences between inpatient and SNF. Discussed with Hillary and provided information with the differences between the two. Provided information to her that CIR representative will be in contact with them to explain more as they are able and once they are ready to complete assessment.          Expected Discharge Plan and Services                                                 Social Determinants of Health (SDOH) Interventions    Readmission Risk Interventions No flowsheet data found.

## 2020-12-28 NOTE — Progress Notes (Signed)
Physical Therapy Treatment Patient Details Name: [name] MRN: [id number] DOB: Sep 01, 1951 Today's Date: 12/28/2020    History of Present Illness Pt. is a 53yoF who comes [facility]  on 1/24 with Rt hemi weakness Leg > Arm. Small acute infarct within the anterior right frontal lobe seen on MRI imaging. MD in room also mentions additional midline infarct on left better correlates with pt symptoms. PMH: CVA c left field deficits, DVT and antiphospholipid antibody syndrome on coumadin, CKD3b, HTN, HLD, hypoTSH, depression.    PT Comments    Pt seen this am, very pleasant, resting in bed and anxious to get moving.  Pt required ModA to transfer supine to sit with HOB elevated and assist to maneuver right hip/LE forward.  Pt sat EOB with ModA initially until midline COG able to be attained. Pt required SBA and repeated vc's to maintain supported balance when distracted. Positive LOB x 2 in sitting regained with ModA and side rail.  Focused session on transfers bed > chair, chair<>BSC each time requiring MaxA, blocking of Right knee to prevent buckling, and repeated vc's for proper technique and hand placement. Pt transferred towards Left side each time to facilitate safety. ModA to scoot back in chair.  Pt unable to initiate R LE LAQ or display quad engagement for knee extension.  Discussed CIR with pt who is very motivated to transition in order to return home to care for husband. Pt's daughter present for session. Questions addressed.  Pt remains an excellent candidate for CIR.   Follow Up Recommendations  CIR;Supervision for mobility/OOB     Equipment Recommendations  None recommended by PT    Recommendations for Other Services Rehab consult     Precautions / Restrictions Precautions Precautions: Fall Precaution Comments: decreased L visual field Restrictions Weight Bearing Restrictions: No    Mobility  Bed Mobility Overal bed mobility: Needs Assistance Bed Mobility: Supine to  Sit;Sit to Supine     Supine to sit: Mod assist Sit to supine: Max assist      Transfers Overall transfer level: Needs assistance Equipment used: Rolling walker (2 wheeled) Transfers: Sit to/from [facility] Sit to Stand: Max assist;From elevated surface Stand pivot transfers: Max assist       General transfer comment:  (Therapy session involved several transfers bed to chair, chair <> BSC with MaxA transferring toward Left side. Nursing present and educated on safe transfers.)  Ambulation/Gait                 Stairs             Wheelchair Mobility    Modified Rankin (Stroke Patients Only)       Balance                                            Cognition Arousal/Alertness: Awake/alert Behavior During Therapy: WFL for tasks assessed/performed Overall Cognitive Status: Within Functional Limits for tasks assessed                                 General Comments: Poor insight into deficits; poor problem solving related to spacial awareness limitations; has baseline visual field deficits from prior CVA      Exercises Other Exercises Other Exercises: Right hand function, and [facility] skills.    General Comments General  comments (skin integrity, edema, etc.):  (PROM sitting R knee and ankle, AAROM R hip sitting x 10 reps. No visible muscle firing noted during LAQ.)      Pertinent Vitals/Pain Pain Assessment: No/denies pain    Home Living                      Prior Function            PT Goals (current goals can now be found in the care plan section) Acute Rehab PT Goals Patient Stated Goal: To return to caregiving for her husband    Frequency    7X/week      PT Plan      Co-evaluation              AM-PAC PT "6 Clicks" Mobility   Outcome Measure  Help needed turning from your back to your side while in a flat bed without using bedrails?: A Lot Help needed moving from lying  on your back to sitting on the side of a flat bed without using bedrails?: A Lot Help needed moving to and from a bed to a chair (including a wheelchair)?: A Lot Help needed standing up from a chair using your arms (e.g., wheelchair or bedside chair)?: A Lot Help needed to walk in hospital room?: Total Help needed climbing 3-5 steps with a railing? : Total 6 Click Score: 10    End of Session Equipment Utilized During Treatment: Gait belt Activity Tolerance: Patient tolerated treatment well;No increased pain Patient left: in chair;with call bell/phone within reach;with chair alarm set;with family/visitor present Nurse Communication: Mobility status PT Visit Diagnosis: Difficulty in walking, not elsewhere classified (R26.2);Muscle weakness (generalized) (M62.81);Other symptoms and signs involving the nervous system (R29.898)     Time: 4097-3532 PT Time Calculation (min) (ACUTE ONLY): 71 min  Charges:  $Therapeutic Exercise: 8-22 mins $Therapeutic Activity: 53-67 mins                     Mikel Cella, PTA   [name] 12/28/2020, 1:50 PM

## 2020-12-28 NOTE — Plan of Care (Signed)
  Problem: Education: Goal: Knowledge of disease or condition will improve 12/28/2020 1554 by Mellody Drown, LPN Outcome: Progressing 12/28/2020 1012 by Rise Patience A, LPN Outcome: Progressing Goal: Knowledge of secondary prevention will improve 12/28/2020 1554 by Mellody Drown, LPN Outcome: Progressing 12/28/2020 1012 by Mellody Drown, LPN Outcome: Progressing Goal: Knowledge of patient specific risk factors addressed and post discharge goals established will improve 12/28/2020 1554 by Mellody Drown, LPN Outcome: Progressing 12/28/2020 1012 by Mellody Drown, LPN Outcome: Progressing Goal: Individualized Educational Video(s) 12/28/2020 1554 by Mellody Drown, LPN Outcome: Progressing 12/28/2020 1012 by Rise Patience A, LPN Outcome: Progressing   Problem: Coping: Goal: Will verbalize positive feelings about self 12/28/2020 1554 by Mellody Drown, LPN Outcome: Progressing 12/28/2020 1012 by Rise Patience A, LPN Outcome: Progressing Goal: Will identify appropriate support needs 12/28/2020 1554 by Mellody Drown, LPN Outcome: Progressing 12/28/2020 1012 by Mellody Drown, LPN Outcome: Progressing   Problem: Health Behavior/Discharge Planning: Goal: Ability to manage health-related needs will improve 12/28/2020 1554 by Mellody Drown, LPN Outcome: Progressing 12/28/2020 1012 by Rise Patience A, LPN Outcome: Progressing   Problem: Self-Care: Goal: Ability to participate in self-care as condition permits will improve 12/28/2020 1554 by Mellody Drown, LPN Outcome: Progressing 12/28/2020 1012 by Mellody Drown, LPN Outcome: Progressing Goal: Verbalization of feelings and concerns over difficulty with self-care will improve 12/28/2020 1554 by Mellody Drown, LPN Outcome: Progressing 12/28/2020 1012 by Rise Patience A, LPN Outcome: Progressing Goal: Ability to communicate needs accurately will improve 12/28/2020 1554 by Mellody Drown, LPN Outcome: Progressing 12/28/2020 1012 by Mellody Drown, LPN Outcome: Progressing   Problem: Nutrition: Goal: Risk of aspiration will decrease 12/28/2020 1554 by Mellody Drown, LPN Outcome: Progressing 12/28/2020 1012 by Rise Patience A, LPN Outcome: Progressing Goal: Dietary intake will improve 12/28/2020 1554 by Mellody Drown, LPN Outcome: Progressing 12/28/2020 1012 by Rise Patience A, LPN Outcome: Progressing   Problem: Intracerebral Hemorrhage Tissue Perfusion: Goal: Complications of Intracerebral Hemorrhage will be minimized 12/28/2020 1554 by Mellody Drown, LPN Outcome: Progressing 12/28/2020 1012 by Mellody Drown, LPN Outcome: Progressing   Problem: Ischemic Stroke/TIA Tissue Perfusion: Goal: Complications of ischemic stroke/TIA will be minimized 12/28/2020 1554 by Mellody Drown, LPN Outcome: Progressing 12/28/2020 1012 by Mellody Drown, LPN Outcome: Progressing   Problem: Spontaneous Subarachnoid Hemorrhage Tissue Perfusion: Goal: Complications of Spontaneous Subarachnoid Hemorrhage will be minimized 12/28/2020 1554 by Mellody Drown, LPN Outcome: Progressing 12/28/2020 1012 by Mellody Drown, LPN Outcome: Progressing

## 2020-12-28 NOTE — Plan of Care (Signed)

## 2020-12-28 NOTE — Progress Notes (Signed)
Inpatient Rehab Admissions:  Inpatient Rehab Consult received.  I spoke to pt over the phone for rehabilitation assessment and to discuss goals and expectations of an inpatient rehab admission.  She is very interested in the rehab program at [facility].  We discussed estimated length of stay to be about 3 weeks and goals to be between supervision and min assist level.  Given her current level of function, we would expect that she will need 24/7 supervision/assist at discharge from CIR.  While her husband is currently admitted at [facility], her daughter is planning to move from [place].  She is currently here and can work from home, so will be available when patient discharges from rehab.  We also discussed that [facility] Medicare would not approve both inpatient rehab and skilled nursing rehab, so pt must d/c home from our program.  She is agreeable to proceed.  I will start insurance authorization request today and call pt's daughter to answer any questions she has.    Signed: Shann Medal, PT, DPT Admissions Coordinator [phone] 12/28/20  1:53 PM

## 2020-12-28 NOTE — Progress Notes (Signed)
Neurology Progress Note   S:// Seen and examined. Reports fluctuating worsening of the right leg strength. Today right leg symptoms worse than yesterday   O:// Current vital signs: BP (!) 145/66 (BP Location: Right Arm)    Pulse 69    Temp 97.9 F (36.6 C) (Oral)    Resp 18    Ht 5\' 8"  (1.727 m)    Wt 85 kg    SpO2 97%    BMI 28.49 kg/m  Vital signs in last 24 hours: Temp:  [96.5 F (35.8 C)-98.8 F (37.1 C)] 97.9 F (36.6 C) (01/26 0723) Pulse Rate:  [69-75] 69 (01/26 0723) Resp:  [15-24] 18 (01/26 0723) BP: (99-145)/(56-100) 145/66 (01/26 0723) SpO2:  [96 %-99 %] 97 % (01/26 0723) Weight:  [85 kg] 85 kg (01/25 2102) General: Awake alert in no distress HEENT: Respiratory manic Lungs: Clear Cardiovascular: Regular rhythm Abdomen nondistended nontender Extremities warm well perfused Neurological exam awake alert oriented x3 No dysarthria No aphasia Cranial nerves II to XII intact with the exception of left homonymous hemianopsia. Motor exam with right lower extremity is barely 2/5 at the hip with symmetric proximal and distal weakness..  Other extremities 5/5. Sensory exam: Mild diminishing of vibratory sensation on the right when compared to left.  Very subtle difference according to the patient. Coordination: No dysmetria   Medications  Current Facility-Administered Medications:     stroke: mapping our early stages of recovery book, , Does not apply, Once, Lenore Cordia, MD   acetaminophen (TYLENOL) tablet 650 mg, 650 mg, Oral, Q4H PRN, 650 mg at 12/28/20 0529 **OR** acetaminophen (TYLENOL) 160 MG/5ML solution 650 mg, 650 mg, Per Tube, Q4H PRN **OR** acetaminophen (TYLENOL) suppository 650 mg, 650 mg, Rectal, Q4H PRN, Zada Finders R, MD   aspirin chewable tablet 81 mg, 81 mg, Oral, Daily, Zada Finders R, MD, 81 mg at 12/27/20 0239   atorvastatin (LIPITOR) tablet 40 mg, 40 mg, Oral, Daily, Amie Portland, MD   donepezil (ARICEPT) tablet 10 mg, 10 mg, Oral, Daily,  Posey Pronto, Vishal R, MD, 10 mg at 12/27/20 1006   escitalopram (LEXAPRO) tablet 20 mg, 20 mg, Oral, Daily, Posey Pronto, Vishal R, MD, 20 mg at 12/27/20 1006   famotidine (PEPCID) tablet 20 mg, 20 mg, Oral, Daily, Amin, Soundra Pilon, MD, 20 mg at 12/27/20 1208   ferrous sulfate tablet 325 mg, 325 mg, Oral, QHS, Amin, Soundra Pilon, MD, 325 mg at 12/27/20 2204   levothyroxine (SYNTHROID) tablet 75 mcg, 75 mcg, Oral, Q0600, Zada Finders R, MD, 75 mcg at 12/28/20 4193   loperamide (IMODIUM) capsule 2 mg, 2 mg, Oral, Q6H PRN, Lorella Nimrod, MD   montelukast (SINGULAIR) tablet 10 mg, 10 mg, Oral, QHS, Amin, Sumayya, MD, 10 mg at 12/27/20 2204   senna-docusate (Senokot-S) tablet 1 tablet, 1 tablet, Oral, QHS PRN, Zada Finders R, MD   traZODone (DESYREL) tablet 50 mg, 50 mg, Oral, QHS, Amin, Soundra Pilon, MD, 50 mg at 12/27/20 2204 Labs CBC    Component Value Date/Time   WBC 5.9 12/27/2020 0350   RBC 3.31 (L) 12/27/2020 0350   HGB 11.1 (L) 12/27/2020 0350   HGB 11.1 12/12/2020 1413   HCT 32.6 (L) 12/27/2020 0350   HCT 31.1 (L) 06/03/2020 1418   PLT 86 (L) 12/27/2020 0350   PLT 239 06/03/2020 1418   MCV 98.5 12/27/2020 0350   MCV 96 06/03/2020 1418   MCV 98 04/23/2012 2100   MCH 33.5 12/27/2020 0350   MCHC 34.0 12/27/2020 0350   RDW 12.0  12/27/2020 0350   RDW 11.9 06/03/2020 1418   RDW 12.7 04/23/2012 2100   LYMPHSABS 1.1 12/26/2020 1953   LYMPHSABS 1.6 06/03/2020 1418   MONOABS 0.5 12/26/2020 1953   EOSABS 0.1 12/26/2020 1953   EOSABS 0.1 06/03/2020 1418   BASOSABS 0.1 12/26/2020 1953   BASOSABS 0.1 06/03/2020 1418    CMP     Component Value Date/Time   NA 138 12/27/2020 0350   NA 141 02/10/2020 1005   NA 141 04/23/2012 2100   K 3.7 12/27/2020 0350   K 3.7 04/23/2012 2100   CL 105 12/27/2020 0350   CL 106 04/23/2012 2100   CO2 24 12/27/2020 0350   CO2 28 04/23/2012 2100   GLUCOSE 114 (H) 12/27/2020 0350   GLUCOSE 112 (H) 04/23/2012 2100   BUN 26 (H) 12/27/2020 0350   BUN 27 02/10/2020  1005   BUN 26 (H) 04/23/2012 2100   CREATININE 1.46 (H) 12/27/2020 0350   CREATININE 1.29 04/23/2012 2100   CALCIUM 9.3 12/27/2020 0350   CALCIUM 9.1 04/23/2012 2100   PROT 7.2 12/26/2020 1953   PROT 7.2 02/10/2020 1005   PROT 7.7 04/23/2012 2100   ALBUMIN 4.0 12/26/2020 1953   ALBUMIN 4.4 02/10/2020 1005   ALBUMIN 4.0 04/23/2012 2100   AST 22 12/26/2020 1953   AST 33 04/23/2012 2100   ALT 16 12/26/2020 1953   ALT 31 04/23/2012 2100   ALKPHOS 65 12/26/2020 1953   ALKPHOS 63 04/23/2012 2100   BILITOT 1.0 12/26/2020 1953   BILITOT 0.6 02/10/2020 1005   BILITOT 0.5 04/23/2012 2100   GFRNONAA 39 (L) 12/27/2020 0350   GFRNONAA 45 (L) 04/23/2012 2100   GFRAA 38 (L) 02/10/2020 1005   GFRAA 52 (L) 04/23/2012 2100    glycosylated hemoglobin-5.6 LDL 87  Lipid Panel     Component Value Date/Time   CHOL 158 12/27/2020 0350   CHOL 196 02/10/2020 1005   TRIG 77 12/27/2020 0350   HDL 56 12/27/2020 0350   HDL 64 02/10/2020 1005   CHOLHDL 2.8 12/27/2020 0350   VLDL 15 12/27/2020 0350   LDLCALC 87 12/27/2020 0350   LDLCALC 111 (H) 02/10/2020 1005     Imaging I have reviewed images in epic and the results pertinent to this consultation are: CTH-no acute changes.  Multiple old infarctions-right occipital, left frontal convexity And right posterior frontal lobe. MRI-small area of an acute stroke in the right frontal area as marked by the green arrow read by radiology.  On my review, there is also a small area in the parasagittal left frontal region which showed restricted diffusion-marked in the thick white arrow  MRA head and neck with distal occlusion of the right PCA that is likely chronic given the old PCA territory infarct on the right.  Moderate stenosis of the left V4.  MRA head with no significant stenosis.  TRANSTHORACIC ECHOCARDIOGRAM IMPRESSIONS  1. Left ventricular ejection fraction, by estimation, is 55 to 60%. The  left ventricle has normal function. The left  ventricle has no regional  wall motion abnormalities. Left ventricular diastolic parameters are  consistent with Grade I diastolic  dysfunction (impaired relaxation).  2. Right ventricular systolic function is normal. The right ventricular  size is normal.  3. Left atrial size was mildly dilated.  4. The mitral valve is grossly normal. Trivial mitral valve  regurgitation.  5. The aortic valve was not well visualized. Aortic valve regurgitation  is trivial.   LE doppler neg for DVT  Assessment:  70 year old woman, who has a past medical history of antiphospholipid antibody syndrome, DVT, dementia, falls, prior strokes with left anonymous hemianopsia that is residual, on Coumadin-subtherapeutic INR on presentation, evaluated by telemedicine neurology yesterday for sudden onset of right leg weakness.  Low NIH stroke scale precluded TPA administration along with the fact that she was on Coumadin, initially with unknown INR.  Detailed discussion yesterday with the family-daughter and sister-in-law regarding risks and benefits of TPA and ultimately decided that unless her symptoms worsen, no TPA to be administered.  Her MRI was completed -shows a embolic-looking infarct in the right frontal area as well as a small embolic-looking infarction in the left parasagittal frontal area.  The left frontal lesion can explain her right leg weakness-the right brain finding does not corroborate with the right leg weakness.  Likely etiology of her strokes is embolic given subtherapeutic anticoagulation.  Platelet count <100. This is a new finding and repeat CBC today also shows low platelet count.  TTE with LA dilatation - can be seen in pts with Paroxysmal Afib. No need for TEE as she is anticoagulated and TEE will not change management.  Will need hem-onc input on anticoagulation given thrombocytopenia.  Impression: Acute ischemic stroke Thrombocytopenia-new On chronic anticoagulation for  antiphospholipid antibody syndrome and DVT.  Subtherapeutic INR on arrival.  Recommendations: Telemetry Frequent neurochecks Would recommend checking with hematology as she will need long-term anticoagulation with Coumadin.  For antiphospholipid antibody syndrome, Coumadin is still preferred and the DOAC's are not recommended.. Start Coumadin 12/30/2020 after hem-onc discussion. Will need to be INR goal 2.5 to 3.5. Aspirin for stroke prevention for now. Discontinue when starting Coumadin. A1c at goal PT OT Speech therapy Atorvastatin for an LDL goal less than 70  Patient recommended for CIR and is agreeable.  Patient also wanted to discuss potential assisted living placement given the social situation with a very sick husband and now her with focal neurological deficits. I asked her to have this conversation with her primary care as well as continue having these conversations with family and social work.  Plan relayed to Dr. Reesa Chew  Inpatient neurology will be available as needed.  Please call with questions.  -- Amie Portland, MD Neurologist [facility] Pager: [phone]

## 2020-12-28 NOTE — Progress Notes (Addendum)
Occupational Therapy Treatment Patient Details Name: [name] MRN: [id number] DOB: 1951-02-21 Today's Date: 12/28/2020    History of present illness Pt. is a 15yoF who comes to [facility]  on 1/24 with Rt hemi weakness Leg > Arm. Small acute infarct within the anterior right frontal lobe seen on MRI imaging. MD in room also mentions additional midline infarct on left better correlates with pt symptoms. PMH: CVA c left field deficits, DVT and antiphospholipid antibody syndrome on coumadin, CKD3b, HTN, HLD, hypoTSH, depression.   OT comments  Pt. Performed self-feeding skills independently with complete set-up. Pt. Required increased time, and cues for repositioning the utensil in her right hand. Pt. Was able to bring the the food to her mouth using the utensils with minimal spillage. Pt. had more difficulty applying enough pressure with the fork when spearing loose square potatos/hash browns. Pt. Worked on right hand function, and [facility] skills. Pt. was able to grasp, and store 1/2" objects in her right hand, however hand difficulty with translatory movements moving objects through her hand.  Pt. Presents with left sided peripheral visual impairments from a remote CVA. Pt. Was independently able to initiate, and use visual compensatory strategies without cuing. Pt. reports that she has been utilizing visual compensatory strategies since her previous CVA.  Pt. Continues to need OT skills ed services to work on improving RUE functioning, and to worked towards improving basic ADL, and IADL functioning. Pt. Continues to be most appropriate for CIR level of care upon discharge to work towards improving, and maximizing independence with ADLs, and IADL tasks    Follow Up Recommendations  CIR    Equipment Recommendations       Recommendations for Other Services      Precautions / Restrictions Precautions Precautions: Fall Restrictions Weight Bearing Restrictions: No       Mobility Bed  Mobility Overal bed mobility: Needs Assistance Bed Mobility: Supine to Sit;Sit to Supine     Supine to sit: Mod assist Sit to supine: Max assist      Transfers Overall transfer level: Needs assistance Equipment used: Rolling walker (2 wheeled) Transfers: Sit to/from [facility] Sit to Stand: Max assist;From elevated surface Stand pivot transfers: Max assist;+2 safety/equipment            Balance                                           ADL either performed or assessed with clinical judgement   ADL Overall ADL's : Needs assistance/impaired                                       General ADL Comments: Pt. was independent with self-feeding skills with complete set-up, requiring increased time to complete the task, and cues for strategies when using her right hand during the task, as well as cues for posiiton of the utensil in the right hand.     Vision Baseline Vision/History:  (Left lateral visual deficits from a remote CVA.) Patient Visual Report: Peripheral vision impairment     Perception     Praxis      Cognition Arousal/Alertness: Awake/alert Behavior During Therapy: WFL for tasks assessed/performed Overall Cognitive Status: Within Functional Limits for tasks assessed  General Comments: Poor insight into deficits; poor problem solving related to spacial awareness limitations; has baseline visual field deficits from prior CVA        Exercises Other Exercises Other Exercises: Right hand function, and [facility] skills.   Shoulder Instructions       General Comments      Pertinent Vitals/ Pain       Pain Assessment: No/denies pain  Home Living                                          Prior Functioning/Environment              Frequency  Min 3X/week        Progress Toward Goals  OT Goals(current goals can now be found in the care  plan section)  Progress towards OT goals: Progressing toward goals  Acute Rehab OT Goals Patient Stated Goal: To return to caregiving for her husband OT Goal Formulation: With patient Potential to Achieve Goals: Fair  Plan      Co-evaluation                 AM-PAC OT "6 Clicks" Daily Activity     Outcome Measure   Help from another person eating meals?: A Little Help from another person taking care of personal grooming?: A Lot Help from another person toileting, which includes using toliet, bedpan, or urinal?: A Lot Help from another person bathing (including washing, rinsing, drying)?: A Lot Help from another person to put on and taking off regular upper body clothing?: A Lot Help from another person to put on and taking off regular lower body clothing?: A Lot 6 Click Score: 13    End of Session    OT Visit Diagnosis: Other abnormalities of gait and mobility (R26.89);Muscle weakness (generalized) (M62.81);Hemiplegia and hemiparesis Hemiplegia - Right/Left: Right Hemiplegia - dominant/non-dominant: Dominant   Activity Tolerance Patient tolerated treatment well   Patient Left in bed;with call bell/phone within reach   Nurse Communication Mobility status        Time: 0973-5329 OT Time Calculation (min): 6min.  Charges: OT General Charges $OT Visit: 1 Visit OT Treatments $Self Care/Home Management : 23-37 mins.  Harrel Carina, MS, OTR/L  Harrel Carina 12/28/2020, 11:39 AM

## 2020-12-29 ENCOUNTER — Inpatient Hospital Stay (HOSPITAL_COMMUNITY)
Admission: RE | Admit: 2020-12-29 | Discharge: 2021-01-20 | DRG: 057 | Disposition: A | Discharge status: Skilled Nursing Facility | Payer: Medicare Other | Source: Other Acute Inpatient Hospital | Attending: Physical Medicine & Rehabilitation | Admitting: Physical Medicine & Rehabilitation

## 2020-12-29 ENCOUNTER — Other Ambulatory Visit: Payer: Self-pay

## 2020-12-29 ENCOUNTER — Encounter (HOSPITAL_COMMUNITY): Payer: Self-pay | Admitting: Physical Medicine & Rehabilitation

## 2020-12-29 DIAGNOSIS — D62 Acute posthemorrhagic anemia: Secondary | ICD-10-CM | POA: Diagnosis present

## 2020-12-29 DIAGNOSIS — K5989 Other specified functional intestinal disorders: Secondary | ICD-10-CM | POA: Diagnosis not present

## 2020-12-29 DIAGNOSIS — M4802 Spinal stenosis, cervical region: Secondary | ICD-10-CM | POA: Diagnosis not present

## 2020-12-29 DIAGNOSIS — R296 Repeated falls: Secondary | ICD-10-CM | POA: Diagnosis present

## 2020-12-29 DIAGNOSIS — F0390 Unspecified dementia without behavioral disturbance: Secondary | ICD-10-CM | POA: Diagnosis present

## 2020-12-29 DIAGNOSIS — Z79890 Hormone replacement therapy (postmenopausal): Secondary | ICD-10-CM | POA: Diagnosis not present

## 2020-12-29 DIAGNOSIS — Z9049 Acquired absence of other specified parts of digestive tract: Secondary | ICD-10-CM | POA: Diagnosis not present

## 2020-12-29 DIAGNOSIS — Z823 Family history of stroke: Secondary | ICD-10-CM | POA: Diagnosis not present

## 2020-12-29 DIAGNOSIS — I129 Hypertensive chronic kidney disease with stage 1 through stage 4 chronic kidney disease, or unspecified chronic kidney disease: Secondary | ICD-10-CM | POA: Diagnosis present

## 2020-12-29 DIAGNOSIS — I6612 Occlusion and stenosis of left anterior cerebral artery: Secondary | ICD-10-CM

## 2020-12-29 DIAGNOSIS — F32A Depression, unspecified: Secondary | ICD-10-CM | POA: Diagnosis not present

## 2020-12-29 DIAGNOSIS — Z7982 Long term (current) use of aspirin: Secondary | ICD-10-CM

## 2020-12-29 DIAGNOSIS — I6930 Unspecified sequelae of cerebral infarction: Secondary | ICD-10-CM | POA: Diagnosis not present

## 2020-12-29 DIAGNOSIS — J302 Other seasonal allergic rhinitis: Secondary | ICD-10-CM | POA: Diagnosis not present

## 2020-12-29 DIAGNOSIS — N319 Neuromuscular dysfunction of bladder, unspecified: Secondary | ICD-10-CM | POA: Diagnosis present

## 2020-12-29 DIAGNOSIS — M1712 Unilateral primary osteoarthritis, left knee: Secondary | ICD-10-CM | POA: Diagnosis not present

## 2020-12-29 DIAGNOSIS — N3289 Other specified disorders of bladder: Secondary | ICD-10-CM | POA: Diagnosis present

## 2020-12-29 DIAGNOSIS — I63521 Cerebral infarction due to unspecified occlusion or stenosis of right anterior cerebral artery: Secondary | ICD-10-CM | POA: Diagnosis not present

## 2020-12-29 DIAGNOSIS — I6389 Other cerebral infarction: Secondary | ICD-10-CM | POA: Diagnosis not present

## 2020-12-29 DIAGNOSIS — D509 Iron deficiency anemia, unspecified: Secondary | ICD-10-CM | POA: Diagnosis not present

## 2020-12-29 DIAGNOSIS — Z7901 Long term (current) use of anticoagulants: Secondary | ICD-10-CM

## 2020-12-29 DIAGNOSIS — I493 Ventricular premature depolarization: Secondary | ICD-10-CM | POA: Diagnosis present

## 2020-12-29 DIAGNOSIS — R509 Fever, unspecified: Secondary | ICD-10-CM | POA: Diagnosis not present

## 2020-12-29 DIAGNOSIS — H5462 Unqualified visual loss, left eye, normal vision right eye: Secondary | ICD-10-CM | POA: Diagnosis present

## 2020-12-29 DIAGNOSIS — G319 Degenerative disease of nervous system, unspecified: Secondary | ICD-10-CM | POA: Diagnosis not present

## 2020-12-29 DIAGNOSIS — R1110 Vomiting, unspecified: Secondary | ICD-10-CM | POA: Diagnosis not present

## 2020-12-29 DIAGNOSIS — Z7401 Bed confinement status: Secondary | ICD-10-CM | POA: Diagnosis not present

## 2020-12-29 DIAGNOSIS — D6959 Other secondary thrombocytopenia: Secondary | ICD-10-CM | POA: Diagnosis not present

## 2020-12-29 DIAGNOSIS — D6861 Antiphospholipid syndrome: Secondary | ICD-10-CM | POA: Diagnosis not present

## 2020-12-29 DIAGNOSIS — Z79899 Other long term (current) drug therapy: Secondary | ICD-10-CM

## 2020-12-29 DIAGNOSIS — Z86718 Personal history of other venous thrombosis and embolism: Secondary | ICD-10-CM | POA: Diagnosis not present

## 2020-12-29 DIAGNOSIS — R5381 Other malaise: Secondary | ICD-10-CM | POA: Diagnosis not present

## 2020-12-29 DIAGNOSIS — D72829 Elevated white blood cell count, unspecified: Secondary | ICD-10-CM

## 2020-12-29 DIAGNOSIS — D696 Thrombocytopenia, unspecified: Secondary | ICD-10-CM | POA: Diagnosis not present

## 2020-12-29 DIAGNOSIS — E663 Overweight: Secondary | ICD-10-CM | POA: Diagnosis present

## 2020-12-29 DIAGNOSIS — M2550 Pain in unspecified joint: Secondary | ICD-10-CM | POA: Diagnosis not present

## 2020-12-29 DIAGNOSIS — I63422 Cerebral infarction due to embolism of left anterior cerebral artery: Secondary | ICD-10-CM | POA: Diagnosis not present

## 2020-12-29 DIAGNOSIS — M4312 Spondylolisthesis, cervical region: Secondary | ICD-10-CM | POA: Diagnosis not present

## 2020-12-29 DIAGNOSIS — Z6827 Body mass index (BMI) 27.0-27.9, adult: Secondary | ICD-10-CM

## 2020-12-29 DIAGNOSIS — N390 Urinary tract infection, site not specified: Secondary | ICD-10-CM | POA: Diagnosis present

## 2020-12-29 DIAGNOSIS — M160 Bilateral primary osteoarthritis of hip: Secondary | ICD-10-CM | POA: Diagnosis not present

## 2020-12-29 DIAGNOSIS — M25562 Pain in left knee: Secondary | ICD-10-CM

## 2020-12-29 DIAGNOSIS — R935 Abnormal findings on diagnostic imaging of other abdominal regions, including retroperitoneum: Secondary | ICD-10-CM | POA: Diagnosis not present

## 2020-12-29 DIAGNOSIS — Z736 Limitation of activities due to disability: Secondary | ICD-10-CM | POA: Diagnosis not present

## 2020-12-29 DIAGNOSIS — E785 Hyperlipidemia, unspecified: Secondary | ICD-10-CM | POA: Diagnosis present

## 2020-12-29 DIAGNOSIS — B9689 Other specified bacterial agents as the cause of diseases classified elsewhere: Secondary | ICD-10-CM | POA: Diagnosis not present

## 2020-12-29 DIAGNOSIS — K6389 Other specified diseases of intestine: Secondary | ICD-10-CM | POA: Diagnosis not present

## 2020-12-29 DIAGNOSIS — M47816 Spondylosis without myelopathy or radiculopathy, lumbar region: Secondary | ICD-10-CM | POA: Diagnosis not present

## 2020-12-29 DIAGNOSIS — Z8669 Personal history of other diseases of the nervous system and sense organs: Secondary | ICD-10-CM | POA: Diagnosis not present

## 2020-12-29 DIAGNOSIS — I10 Essential (primary) hypertension: Secondary | ICD-10-CM | POA: Diagnosis not present

## 2020-12-29 DIAGNOSIS — I69351 Hemiplegia and hemiparesis following cerebral infarction affecting right dominant side: Secondary | ICD-10-CM | POA: Diagnosis not present

## 2020-12-29 DIAGNOSIS — K219 Gastro-esophageal reflux disease without esophagitis: Secondary | ICD-10-CM | POA: Diagnosis not present

## 2020-12-29 DIAGNOSIS — R600 Localized edema: Secondary | ICD-10-CM | POA: Diagnosis not present

## 2020-12-29 DIAGNOSIS — K567 Ileus, unspecified: Secondary | ICD-10-CM | POA: Diagnosis not present

## 2020-12-29 DIAGNOSIS — R29704 NIHSS score 4: Secondary | ICD-10-CM | POA: Diagnosis present

## 2020-12-29 DIAGNOSIS — E039 Hypothyroidism, unspecified: Secondary | ICD-10-CM | POA: Diagnosis present

## 2020-12-29 DIAGNOSIS — M810 Age-related osteoporosis without current pathological fracture: Secondary | ICD-10-CM | POA: Diagnosis present

## 2020-12-29 DIAGNOSIS — I639 Cerebral infarction, unspecified: Secondary | ICD-10-CM | POA: Diagnosis not present

## 2020-12-29 DIAGNOSIS — I700 Atherosclerosis of aorta: Secondary | ICD-10-CM | POA: Diagnosis not present

## 2020-12-29 DIAGNOSIS — M50022 Cervical disc disorder at C5-C6 level with myelopathy: Secondary | ICD-10-CM | POA: Diagnosis not present

## 2020-12-29 DIAGNOSIS — E569 Vitamin deficiency, unspecified: Secondary | ICD-10-CM | POA: Diagnosis not present

## 2020-12-29 DIAGNOSIS — I498 Other specified cardiac arrhythmias: Secondary | ICD-10-CM | POA: Diagnosis present

## 2020-12-29 DIAGNOSIS — Z20822 Contact with and (suspected) exposure to covid-19: Secondary | ICD-10-CM | POA: Diagnosis not present

## 2020-12-29 DIAGNOSIS — E7801 Familial hypercholesterolemia: Secondary | ICD-10-CM | POA: Diagnosis present

## 2020-12-29 DIAGNOSIS — M6281 Muscle weakness (generalized): Secondary | ICD-10-CM | POA: Diagnosis not present

## 2020-12-29 DIAGNOSIS — N1832 Chronic kidney disease, stage 3b: Secondary | ICD-10-CM | POA: Diagnosis not present

## 2020-12-29 DIAGNOSIS — H53462 Homonymous bilateral field defects, left side: Secondary | ICD-10-CM | POA: Diagnosis not present

## 2020-12-29 LAB — CBC WITH DIFFERENTIAL/PLATELET
Abs Immature Granulocytes: 0.02 10*3/uL (ref 0.00–0.07)
Basophils Absolute: 0 10*3/uL (ref 0.0–0.1)
Basophils Relative: 1 %
Eosinophils Absolute: 0.1 10*3/uL (ref 0.0–0.5)
Eosinophils Relative: 3 %
HCT: 33.4 % — ABNORMAL LOW (ref 36.0–46.0)
Hemoglobin: 11.2 g/dL — ABNORMAL LOW (ref 12.0–15.0)
Immature Granulocytes: 1 %
Lymphocytes Relative: 35 %
Lymphs Abs: 1.5 10*3/uL (ref 0.7–4.0)
MCH: 33 pg (ref 26.0–34.0)
MCHC: 33.5 g/dL (ref 30.0–36.0)
MCV: 98.5 fL (ref 80.0–100.0)
Monocytes Absolute: 0.4 10*3/uL (ref 0.1–1.0)
Monocytes Relative: 8 %
Neutro Abs: 2.3 10*3/uL (ref 1.7–7.7)
Neutrophils Relative %: 52 %
Platelets: 85 10*3/uL — ABNORMAL LOW (ref 150–400)
RBC: 3.39 MIL/uL — ABNORMAL LOW (ref 3.87–5.11)
RDW: 12.2 % (ref 11.5–15.5)
WBC: 4.4 10*3/uL (ref 4.0–10.5)
nRBC: 0 % (ref 0.0–0.2)

## 2020-12-29 LAB — GASTROINTESTINAL PANEL BY PCR, STOOL (REPLACES STOOL CULTURE)

## 2020-12-29 LAB — HEPATITIS C ANTIBODY: HCV Ab: NONREACTIVE

## 2020-12-29 LAB — LACTOFERRIN, FECAL, QUALITATIVE: Lactoferrin, Fecal, Qual: NEGATIVE

## 2020-12-29 LAB — HEPATITIS B CORE ANTIBODY, TOTAL: Hep B Core Total Ab: NONREACTIVE

## 2020-12-29 LAB — IMMATURE PLATELET FRACTION: Immature Platelet Fraction: 6.2 % (ref 1.2–8.6)

## 2020-12-29 LAB — FOLATE: Folate: 16.8 ng/mL (ref >5.9)

## 2020-12-29 LAB — VITAMIN B12: Vitamin B-12: 511 pg/mL (ref 180–914)

## 2020-12-29 MED ORDER — WARFARIN SODIUM 5 MG PO TABS
5.0000 mg | ORAL_TABLET | Freq: Once | ORAL | Status: AC
  Administered 2020-12-30: 5 mg via ORAL
  Filled 2020-12-29: qty 1

## 2020-12-29 MED ORDER — ACETAMINOPHEN 160 MG/5ML PO SOLN
650.0000 mg | ORAL | Status: DC | PRN
Start: 2020-12-29 — End: 2021-01-20

## 2020-12-29 MED ORDER — ENOXAPARIN SODIUM 80 MG/0.8ML ~~LOC~~ SOLN: 80.0000 mg | Freq: Two times a day (BID) | SUBCUTANEOUS | Status: DC

## 2020-12-29 MED ORDER — DONEPEZIL HCL 10 MG PO TABS
10.0000 mg | ORAL_TABLET | Freq: Every day | ORAL | Status: DC
  Administered 2020-12-30 – 2021-01-04 (×6): 10 mg via ORAL
  Filled 2020-12-29 (×6): qty 1

## 2020-12-29 MED ORDER — ACETAMINOPHEN 650 MG RE SUPP
650.0000 mg | RECTAL | Status: DC | PRN
  Administered 2021-01-09: 650 mg via RECTAL
  Filled 2020-12-29 (×2): qty 1

## 2020-12-29 MED ORDER — FERROUS SULFATE 325 (65 FE) MG PO TABS
325.0000 mg | ORAL_TABLET | Freq: Every day | ORAL | Status: DC
  Administered 2020-12-29 – 2021-01-19 (×21): 325 mg via ORAL
  Filled 2020-12-29 (×21): qty 1

## 2020-12-29 MED ORDER — LEVOTHYROXINE SODIUM 100 MCG PO TABS
100.0000 ug | ORAL_TABLET | Freq: Every day | ORAL | Status: DC
  Filled 2020-12-29: qty 1

## 2020-12-29 MED ORDER — LOPERAMIDE HCL 2 MG PO CAPS
2.0000 mg | ORAL_CAPSULE | Freq: Four times a day (QID) | ORAL | Status: DC | PRN
  Administered 2021-01-01 – 2021-01-04 (×2): 2 mg via ORAL
  Filled 2020-12-29 (×2): qty 1

## 2020-12-29 MED ORDER — ASPIRIN 81 MG PO CHEW
81.0000 mg | CHEWABLE_TABLET | Freq: Every day | ORAL | Status: DC
  Administered 2020-12-30 – 2021-01-02 (×4): 81 mg via ORAL
  Filled 2020-12-29 (×4): qty 1

## 2020-12-29 MED ORDER — LEVOTHYROXINE SODIUM 100 MCG PO TABS
100.0000 ug | ORAL_TABLET | Freq: Every day | ORAL | Status: DC
  Administered 2020-12-30 – 2021-01-20 (×21): 100 ug via ORAL
  Filled 2020-12-29 (×21): qty 1

## 2020-12-29 MED ORDER — ATORVASTATIN CALCIUM 80 MG PO TABS: 80.0000 mg | ORAL_TABLET | Freq: Every day | ORAL | Status: DC

## 2020-12-29 MED ORDER — WARFARIN - PHARMACIST DOSING INPATIENT: Freq: Every day | Status: DC

## 2020-12-29 MED ORDER — ACETAMINOPHEN 325 MG PO TABS: 650.0000 mg | ORAL_TABLET | ORAL | Status: DC | PRN

## 2020-12-29 MED ORDER — SENNOSIDES-DOCUSATE SODIUM 8.6-50 MG PO TABS: 1.0000 | ORAL_TABLET | Freq: Every evening | ORAL | Status: DC | PRN

## 2020-12-29 MED ORDER — LOPERAMIDE HCL 2 MG PO CAPS: 2.0000 mg | ORAL_CAPSULE | Freq: Four times a day (QID) | ORAL | 0 refills | Status: DC | PRN

## 2020-12-29 MED ORDER — ATORVASTATIN CALCIUM 80 MG PO TABS
80.0000 mg | ORAL_TABLET | Freq: Every day | ORAL | Status: DC
  Administered 2020-12-30 – 2021-01-02 (×4): 80 mg via ORAL
  Filled 2020-12-29 (×4): qty 1

## 2020-12-29 MED ORDER — TRAZODONE HCL 50 MG PO TABS
50.0000 mg | ORAL_TABLET | Freq: Every day | ORAL | Status: DC
  Administered 2020-12-29 – 2021-01-10 (×12): 50 mg via ORAL
  Filled 2020-12-29 (×13): qty 1

## 2020-12-29 MED ORDER — ESCITALOPRAM OXALATE 10 MG PO TABS
20.0000 mg | ORAL_TABLET | Freq: Every day | ORAL | Status: DC
  Administered 2020-12-30 – 2021-01-20 (×21): 20 mg via ORAL
  Filled 2020-12-29 (×21): qty 2

## 2020-12-29 MED ORDER — ACETAMINOPHEN 325 MG PO TABS
650.0000 mg | ORAL_TABLET | ORAL | Status: DC | PRN
  Administered 2020-12-30 – 2021-01-20 (×29): 650 mg via ORAL
  Filled 2020-12-29 (×34): qty 2

## 2020-12-29 MED ORDER — ENOXAPARIN SODIUM 80 MG/0.8ML ~~LOC~~ SOLN
80.0000 mg | Freq: Two times a day (BID) | SUBCUTANEOUS | Status: DC
  Administered 2020-12-30 (×2): 80 mg via SUBCUTANEOUS
  Filled 2020-12-29 (×4): qty 0.8

## 2020-12-29 MED ORDER — MONTELUKAST SODIUM 10 MG PO TABS
10.0000 mg | ORAL_TABLET | Freq: Every day | ORAL | Status: DC
  Administered 2020-12-29 – 2021-01-19 (×21): 10 mg via ORAL
  Filled 2020-12-29 (×21): qty 1

## 2020-12-29 MED ORDER — LOSARTAN POTASSIUM 25 MG PO TABS
25.0000 mg | ORAL_TABLET | Freq: Every day | ORAL | Status: DC
  Administered 2020-12-30 – 2021-01-08 (×10): 25 mg via ORAL
  Filled 2020-12-29 (×10): qty 1

## 2020-12-29 MED ORDER — LEVOTHYROXINE SODIUM 100 MCG PO TABS: 100.0000 ug | ORAL_TABLET | Freq: Every day | ORAL | Status: AC

## 2020-12-29 MED ORDER — FAMOTIDINE 20 MG PO TABS
20.0000 mg | ORAL_TABLET | Freq: Every day | ORAL | Status: DC
  Administered 2020-12-30 – 2021-01-20 (×21): 20 mg via ORAL
  Filled 2020-12-29 (×22): qty 1

## 2020-12-29 MED ORDER — LOSARTAN POTASSIUM 25 MG PO TABS
25.0000 mg | ORAL_TABLET | Freq: Every day | ORAL | Status: DC
  Administered 2020-12-29: 25 mg via ORAL
  Filled 2020-12-29: qty 1

## 2020-12-29 NOTE — Progress Notes (Signed)
Nurse received SBAR report from [place], RN at [facility]. Pt arrived to 4MW unit rm 7 in no distress, breathing even and unlabored, alert and oriented x4, no concerns at this time. Pt oriented to unit and utilities.

## 2020-12-29 NOTE — Progress Notes (Signed)
Patient was seen by hematologist Dr. Mike Gip. Appreciate the consultation. From a neurological standpoint, okay to start anticoagulation tomorrow as advised by hematology with the Lovenox bridge and follow factor Xa levels. Goal INR 2.5-3.5. Patient awaiting CIR placement. She should follow-up with outpatient neurology in 4 to 6 weeks -preferably a stroke clinic either at [facility] neurology in [place] or [facility] wherever the family prefers.  Inpatient neurology will be available as needed.  -- Amie Portland, MD Neurologist [facility] Pager: [phone]

## 2020-12-29 NOTE — Plan of Care (Signed)
Pt given general unit education and preventative stroke education.

## 2020-12-29 NOTE — Discharge Summary (Signed)
Physician Discharge Summary  [name] [facility] [id number] DOB: 01-27-1951 DOA: 12/26/2020  PCP: Juline Patch, MD  Admit date: 12/26/2020 Discharge date: 12/29/2020  Admitted From: Home Disposition: CIR  Recommendations for Outpatient Follow-up:  1. Follow up with PCP in 1-2 weeks 2. Follow-up with neurology in 2 to 4 weeks 3. Follow-up with gastroenterology 4. Follow-up with hematology 5. Please obtain BMP/CBC in one week 6. Please follow up on the following pending results: None  Home Health: No Equipment/Devices: Rolling walker Discharge Condition: Stable CODE STATUS: Full Diet recommendation: Heart Healthy / Carb Modified   Brief/Interim Summary: Kathryn Dilling Hollowoodis a 70 y.o.femalewith medical history significant forhistory of CVAwith residual left lateral visual field deficit, history of DVT and antiphospholipid antibody syndrome on Coumadin, CKD stage IIIb, hypertension, hyperlipidemia, hypothyroidism, depression, and mild cognitive impairment who presents to the ED for evaluation of right-sided weakness. Mostly involving the right leg.  On arrival she was hemodynamically stable, labs without any significant abnormality, UA negative for UTI, UDS negative, SARS-CoV-2 PCR negative. Initial CT head was without any acute findings and did show multiple old infarcts.  Family decided not to proceed with TPA.  MRI brain with multiple small infarcts with concern of embolic phenomena.  Patient with subtherapeutic INR on Coumadin can be infected.  Lower extremity DVT was negative for any clots.  Also shows multiple old infarcts and chronic small vessel ischemia.  MRA with distal occlusion of the right PCA noted and felt to be chronic given old PCA territory infarct. Moderate stenosis of the left V4 segment also seen. Neurology was consulted and she underwent stroke work-up.  Echocardiogram was within normal limit no obvious thrombus or vegetations. Concern of embolic stroke most  likely secondary to subtherapeutic INR with A. fib.  She also has LDL of 87 with a goal below 70 so her Lipitor dose was increased. PT recommended CIR and she is being discharged to CIR for further rehab.  Patient was also found to have thrombocytopenia, platelets were within normal limit 70-month ago.  No obvious bleeding and platelet remained stable in 80s.  Hematology was consulted as he was on Coumadin.  Coumadin is recommended for antiphospholipid syndrome.  Her anticoagulation was held for 3 days and they are recommending restarting from tomorrow 12/30/2020 with a bridge to Lovenox.  She will need monitoring by pharmacy till she will reach to her goal INR of 2.5-3.5.  Patient was also experiencing diarrhea with 3-4 loose bowel movements intermittently for the past few weeks and wants to see a gastroenterologist.  We sent her stool for basic stool studies, and advised to follow-up as an outpatient.  Patient also has an history of hypothyroidism, TSH was high with low normal limit of free T4.  Her Synthroid dose was increased from 75-100 MCG and she will need a repeat TSH in 4 weeks.  She will continue with rest of her home medications and follow-up with her providers.  Discharge Diagnoses:  Principal Problem:   Acute CVA (cerebrovascular accident) [facility]) Active Problems:   Adult hypothyroidism   Essential hypertension   Anticoagulant long-term use   Stage 3 chronic kidney disease ([place])   Thrombocytopenia (HCC)   CVA (cerebral vascular accident) [facility])   Discharge Instructions  Discharge Instructions    Diet - low sodium heart healthy   Complete by: As directed    Discharge instructions   Complete by: As directed    It was pleasure taking care of you. I increase the dose of Synthroid  to 100 mg daily as your TSH was high, you need to follow-up with your primary care provider or endocrinologist in 4 weeks to have it checked again. You are being started on Lovenox from tomorrow on  12/30/2020 along with your Coumadin, your levels has to be followed up daily until you reach to your goal INR of 2.5- 3.0. It is very important that you take your blood thinners regularly. We are stopping aspirin while you are taking blood thinners to reduce the chances of bleeding. Also follow-up with hematology regarding your low platelets count, they will discuss the results with you during outpatient appointment. Follow-up with gastroenterology for your diarrhea, they can discuss the results of your stool studies during that appointment.   Increase activity slowly   Complete by: As directed      Allergies as of 12/29/2020      Reactions   Penicillin G Rash      Medication List    TAKE these medications   acetaminophen 325 MG tablet Commonly known as: TYLENOL Take 2 tablets (650 mg total) by mouth every 4 (four) hours as needed for mild pain (or temp > 37.5 C (99.5 F)). Notes to patient: Last dose given 12/29/20 8:54am   atorvastatin 80 MG tablet Commonly known as: LIPITOR Take 1 tablet (80 mg total) by mouth daily. Start taking on: December 30, 2020 What changed:   medication strength  how much to take Notes to patient: Last dose given 12/29/20 9:55am   CENTRUM SILVER 50+WOMEN PO Take 1 tablet by mouth daily. Notes to patient: Not given while in hospital    donepezil 10 MG tablet Commonly known as: ARICEPT Take 1 tablet by mouth daily. Notes to patient: Last dose given 12/29/20 9:56am   enoxaparin 80 MG/0.8ML injection Commonly known as: LOVENOX Inject 0.8 mLs (80 mg total) into the skin every 12 (twelve) hours. Start taking on: December 30, 2020 Notes to patient: Not given while in hospital    escitalopram 20 MG tablet Commonly known as: LEXAPRO Take 1 tablet (20 mg total) by mouth daily. Notes to patient: Last dose given 12/29/20 9:56am    famotidine 20 MG tablet Commonly known as: PEPCID Take 1 tablet by mouth daily. singh Notes to patient: Last dose given 12/29/20  9:56am   ferrous sulfate 325 (65 FE) MG EC tablet Take 1 tablet (325 mg total) by mouth daily with breakfast. What changed: when to take this Notes to patient: Last dose given 8:36pm   levothyroxine 100 MCG tablet Commonly known as: SYNTHROID Take 1 tablet (100 mcg total) by mouth daily at 6 (six) AM. Start taking on: December 30, 2020 What changed:   medication strength  how much to take  when to take this Notes to patient: Last dose given 12/29/20 4:38am   loperamide 2 MG capsule Commonly known as: IMODIUM Take 1 capsule (2 mg total) by mouth every 6 (six) hours as needed for diarrhea or loose stools. Notes to patient: Last dose given 12/28/20 10:47am   losartan 25 MG tablet Commonly known as: COZAAR Take 25 mg by mouth daily. Notes to patient: Last does given 12/29/20 9:56am   montelukast 10 MG tablet Commonly known as: SINGULAIR TAKE 1 TABLET BY MOUTH EVERYDAY AT BEDTIME Notes to patient: Last dose given 12/28/20 8:36pm   mupirocin ointment 2 % Commonly known as: BACTROBAN Place 1 application into the nose 2 (two) times daily. Notes to patient: Not given while in hospital    solifenacin 5 MG  tablet Commonly known as: VESICARE Take 5 mg by mouth daily. Notes to patient: Not given while in hospital    torsemide 5 MG tablet Commonly known as: DEMADEX Take 1 tablet by mouth daily. singh Notes to patient: Not given while in hospital    traZODone 50 MG tablet Commonly known as: DESYREL Take 1 tablet (50 mg total) by mouth daily. Notes to patient: Last dose given 12/28/20 8:36pm   vitamin B-12 1000 MCG tablet Commonly known as: CYANOCOBALAMIN Take 1,000 mcg by mouth daily. Notes to patient: Not given while in hospital    warfarin 3 MG tablet Commonly known as: COUMADIN TAKE 1 TABLET BY MOUTH EVERY DAY What changed:   when to take this  additional instructions  Another medication with the same name was removed. Continue taking this medication, and follow the  directions you see here. Notes to patient: Not given while in hospital        Follow-up Information    Juline Patch, MD. Go on 01/12/2021.   Specialty: Family Medicine Why: @ 1:40 pm Contact information: [place] [address] [phone]              Allergies  Allergen Reactions  . Penicillin G Rash    Consultations:  Neurology  Oncology  Procedures/Studies: MR ANGIO HEAD WO CONTRAST  Result Date: 12/26/2020 CLINICAL DATA:  Acute neurologic deficits. EXAM: MRI HEAD WITHOUT CONTRAST MRA HEAD WITHOUT CONTRAST MRA NECK WITHOUT CONTRAST TECHNIQUE: Multiplanar, multiecho pulse sequences of the brain and surrounding structures were obtained without intravenous contrast. Angiographic images of the Circle of Willis were obtained using MRA technique without intravenous contrast. Angiographic images of the neck were obtained using MRA technique without intravenous contrast. Carotid stenosis measurements (when applicable) are obtained utilizing NASCET criteria, using the distal internal carotid diameter as the denominator. COMPARISON:  None. FINDINGS: MRI HEAD FINDINGS Brain: Small acute infarct within the anterior right frontal lobe. There are old infarcts of both cerebellar hemispheres, the left frontal lobe, the right parietal lobe and the right occipital lobe. No acute or chronic hemorrhage. There is multifocal hyperintense T2-weighted signal within the white matter. Generalized volume loss without a clear lobar predilection. The midline structures are normal. Vascular: Major flow voids are preserved. Skull and upper cervical spine: Normal calvarium and skull base. Visualized upper cervical spine and soft tissues are normal. Sinuses/Orbits:No paranasal sinus fluid levels or advanced mucosal thickening. No mastoid or middle ear effusion. Normal orbits. MRA HEAD FINDINGS POSTERIOR CIRCULATION: --Vertebral arteries: Normal --Inferior cerebellar arteries: Normal.  --Basilar artery: Normal. --Superior cerebellar arteries: Normal. --Posterior cerebral arteries: Distal occlusion of the right PCA is likely chronic given old PCA territory infarct. Moderate stenosis of the left P4 segment. ANTERIOR CIRCULATION: --Intracranial internal carotid arteries: Normal. --Anterior cerebral arteries (ACA): Normal. --Middle cerebral arteries (MCA): Normal. ANATOMIC VARIANTS: None MRA NECK FINDINGS Codominant vertebral arteries are normal along the entirety of the visualized portion. Time-of-flight imaging poorly images of the V1 and V3 segments. No carotid stenosis. IMPRESSION: 1. Small acute infarct within the anterior right frontal lobe. No hemorrhage or mass effect. 2. Multiple old infarcts and chronic small vessel ischemia. 3. Distal occlusion of the right PCA is likely chronic given old PCA territory infarct. 4. Moderate stenosis of the left P4 segment. 5. Time-of-flight MRA of the neck without hemodynamically significant stenosis. Electronically Signed   By: Ulyses Jarred M.D.   On: 12/26/2020 23:25   MR ANGIO NECK WO CONTRAST  Result Date:  12/26/2020 CLINICAL DATA:  Acute neurologic deficits. EXAM: MRI HEAD WITHOUT CONTRAST MRA HEAD WITHOUT CONTRAST MRA NECK WITHOUT CONTRAST TECHNIQUE: Multiplanar, multiecho pulse sequences of the brain and surrounding structures were obtained without intravenous contrast. Angiographic images of the Circle of Willis were obtained using MRA technique without intravenous contrast. Angiographic images of the neck were obtained using MRA technique without intravenous contrast. Carotid stenosis measurements (when applicable) are obtained utilizing NASCET criteria, using the distal internal carotid diameter as the denominator. COMPARISON:  None. FINDINGS: MRI HEAD FINDINGS Brain: Small acute infarct within the anterior right frontal lobe. There are old infarcts of both cerebellar hemispheres, the left frontal lobe, the right parietal lobe and the right  occipital lobe. No acute or chronic hemorrhage. There is multifocal hyperintense T2-weighted signal within the white matter. Generalized volume loss without a clear lobar predilection. The midline structures are normal. Vascular: Major flow voids are preserved. Skull and upper cervical spine: Normal calvarium and skull base. Visualized upper cervical spine and soft tissues are normal. Sinuses/Orbits:No paranasal sinus fluid levels or advanced mucosal thickening. No mastoid or middle ear effusion. Normal orbits. MRA HEAD FINDINGS POSTERIOR CIRCULATION: --Vertebral arteries: Normal --Inferior cerebellar arteries: Normal. --Basilar artery: Normal. --Superior cerebellar arteries: Normal. --Posterior cerebral arteries: Distal occlusion of the right PCA is likely chronic given old PCA territory infarct. Moderate stenosis of the left P4 segment. ANTERIOR CIRCULATION: --Intracranial internal carotid arteries: Normal. --Anterior cerebral arteries (ACA): Normal. --Middle cerebral arteries (MCA): Normal. ANATOMIC VARIANTS: None MRA NECK FINDINGS Codominant vertebral arteries are normal along the entirety of the visualized portion. Time-of-flight imaging poorly images of the V1 and V3 segments. No carotid stenosis. IMPRESSION: 1. Small acute infarct within the anterior right frontal lobe. No hemorrhage or mass effect. 2. Multiple old infarcts and chronic small vessel ischemia. 3. Distal occlusion of the right PCA is likely chronic given old PCA territory infarct. 4. Moderate stenosis of the left P4 segment. 5. Time-of-flight MRA of the neck without hemodynamically significant stenosis. Electronically Signed   By: Ulyses Jarred M.D.   On: 12/26/2020 23:25   MR BRAIN WO CONTRAST  Result Date: 12/26/2020 CLINICAL DATA:  Acute neurologic deficits. EXAM: MRI HEAD WITHOUT CONTRAST MRA HEAD WITHOUT CONTRAST MRA NECK WITHOUT CONTRAST TECHNIQUE: Multiplanar, multiecho pulse sequences of the brain and surrounding structures were  obtained without intravenous contrast. Angiographic images of the Circle of Willis were obtained using MRA technique without intravenous contrast. Angiographic images of the neck were obtained using MRA technique without intravenous contrast. Carotid stenosis measurements (when applicable) are obtained utilizing NASCET criteria, using the distal internal carotid diameter as the denominator. COMPARISON:  None. FINDINGS: MRI HEAD FINDINGS Brain: Small acute infarct within the anterior right frontal lobe. There are old infarcts of both cerebellar hemispheres, the left frontal lobe, the right parietal lobe and the right occipital lobe. No acute or chronic hemorrhage. There is multifocal hyperintense T2-weighted signal within the white matter. Generalized volume loss without a clear lobar predilection. The midline structures are normal. Vascular: Major flow voids are preserved. Skull and upper cervical spine: Normal calvarium and skull base. Visualized upper cervical spine and soft tissues are normal. Sinuses/Orbits:No paranasal sinus fluid levels or advanced mucosal thickening. No mastoid or middle ear effusion. Normal orbits. MRA HEAD FINDINGS POSTERIOR CIRCULATION: --Vertebral arteries: Normal --Inferior cerebellar arteries: Normal. --Basilar artery: Normal. --Superior cerebellar arteries: Normal. --Posterior cerebral arteries: Distal occlusion of the right PCA is likely chronic given old PCA territory infarct. Moderate stenosis of the left P4 segment. ANTERIOR  CIRCULATION: --Intracranial internal carotid arteries: Normal. --Anterior cerebral arteries (ACA): Normal. --Middle cerebral arteries (MCA): Normal. ANATOMIC VARIANTS: None MRA NECK FINDINGS Codominant vertebral arteries are normal along the entirety of the visualized portion. Time-of-flight imaging poorly images of the V1 and V3 segments. No carotid stenosis. IMPRESSION: 1. Small acute infarct within the anterior right frontal lobe. No hemorrhage or mass  effect. 2. Multiple old infarcts and chronic small vessel ischemia. 3. Distal occlusion of the right PCA is likely chronic given old PCA territory infarct. 4. Moderate stenosis of the left P4 segment. 5. Time-of-flight MRA of the neck without hemodynamically significant stenosis. Electronically Signed   By: Ulyses Jarred M.D.   On: 12/26/2020 23:25   US Venous Img Lower Bilateral (DVT)  Result Date: 12/27/2020 CLINICAL DATA:  Bilateral leg pain EXAM: BILATERAL LOWER EXTREMITY VENOUS DOPPLER ULTRASOUND TECHNIQUE: Gray-scale sonography with graded compression, as well as color Doppler and duplex ultrasound were performed to evaluate the lower extremity deep venous systems from the level of the common femoral vein and including the common femoral, femoral, profunda femoral, popliteal and calf veins including the posterior tibial, peroneal and gastrocnemius veins when visible. The superficial great saphenous vein was also interrogated. Spectral Doppler was utilized to evaluate flow at rest and with distal augmentation maneuvers in the common femoral, femoral and popliteal veins. COMPARISON:  None. FINDINGS: RIGHT LOWER EXTREMITY Common Femoral Vein: No evidence of thrombus. Normal compressibility, respiratory phasicity and response to augmentation. Saphenofemoral Junction: No evidence of thrombus. Normal compressibility and flow on color Doppler imaging. Profunda Femoral Vein: No evidence of thrombus. Normal compressibility and flow on color Doppler imaging. Femoral Vein: No evidence of thrombus. Normal compressibility, respiratory phasicity and response to augmentation. Popliteal Vein: No evidence of thrombus. Normal compressibility, respiratory phasicity and response to augmentation. Calf Veins: No evidence of thrombus. Normal compressibility and flow on color Doppler imaging. Superficial Great Saphenous Vein: No evidence of thrombus. Normal compressibility. Venous Reflux:  None. Other Findings:  None. LEFT LOWER  EXTREMITY Common Femoral Vein: No evidence of thrombus. Normal compressibility, respiratory phasicity and response to augmentation. Saphenofemoral Junction: No evidence of thrombus. Normal compressibility and flow on color Doppler imaging. Profunda Femoral Vein: No evidence of thrombus. Normal compressibility and flow on color Doppler imaging. Femoral Vein: No evidence of thrombus. Normal compressibility, respiratory phasicity and response to augmentation. Popliteal Vein: No evidence of thrombus. Normal compressibility, respiratory phasicity and response to augmentation. Calf Veins: No evidence of thrombus. Normal compressibility and flow on color Doppler imaging. Superficial Great Saphenous Vein: No evidence of thrombus. Normal compressibility. Venous Reflux:  None. Other Findings:  None. IMPRESSION: No evidence of deep venous thrombosis in either lower extremity. Electronically Signed   By: Inez Catalina M.D.   On: 12/27/2020 11:58   ECHOCARDIOGRAM COMPLETE  Result Date: 12/27/2020    ECHOCARDIOGRAM REPORT   Patient Name:   TEHANI TEDDER Date of Exam: 12/27/2020 Medical Rec #:  [id number]          Height:       68.0 in Accession #:    [id number]         Weight:       186.0 lb Date of Birth:  05/05/1951         BSA:          1.982 m Patient Age:    44 years           BP:           162/80 mmHg  Patient Gender: F                  HR:           74 bpm. Exam Location:  [facility] Procedure: 2D Echo, Color Doppler, Cardiac Doppler and Intracardiac            Opacification Agent Indications:     I163.9 Stroke  History:         Patient has no prior history of Echocardiogram examinations.                  Risk Factors:Hypertension and Dyslipidemia.  Sonographer:     Humphrey Rolls RDCS (AE) Referring Phys:  [id number] [facility] Raghav Verrilli Diagnosing Phys: Harold Hedge MD  Sonographer Comments: Suboptimal apical window. Image acquisition challenging due to patient body habitus. IMPRESSIONS  1. Left ventricular ejection fraction, by  estimation, is 55 to 60%. The left ventricle has normal function. The left ventricle has no regional wall motion abnormalities. Left ventricular diastolic parameters are consistent with Grade I diastolic dysfunction (impaired relaxation).  2. Right ventricular systolic function is normal. The right ventricular size is normal.  3. Left atrial size was mildly dilated.  4. The mitral valve is grossly normal. Trivial mitral valve regurgitation.  5. The aortic valve was not well visualized. Aortic valve regurgitation is trivial. FINDINGS  Left Ventricle: Left ventricular ejection fraction, by estimation, is 55 to 60%. The left ventricle has normal function. The left ventricle has no regional wall motion abnormalities. Definity contrast agent was given IV to delineate the left ventricular  endocardial borders. The left ventricular internal cavity size was normal in size. There is no left ventricular hypertrophy. Left ventricular diastolic parameters are consistent with Grade I diastolic dysfunction (impaired relaxation). Right Ventricle: The right ventricular size is normal. No increase in right ventricular wall thickness. Right ventricular systolic function is normal. Left Atrium: Left atrial size was mildly dilated. Right Atrium: Right atrial size was normal in size. Pericardium: There is no evidence of pericardial effusion. Mitral Valve: The mitral valve is grossly normal. Trivial mitral valve regurgitation. MV peak gradient, 4.9 mmHg. The mean mitral valve gradient is 2.0 mmHg. Tricuspid Valve: The tricuspid valve is not well visualized. Tricuspid valve regurgitation is trivial. Aortic Valve: The aortic valve was not well visualized. Aortic valve regurgitation is trivial. Aortic valve mean gradient measures 2.0 mmHg. Aortic valve peak gradient measures 4.0 mmHg. Aortic valve area, by VTI measures 2.54 cm. Pulmonic Valve: The pulmonic valve was not well visualized. Pulmonic valve regurgitation is trivial. Aorta: The  aortic root is normal in size and structure. IAS/Shunts: No atrial level shunt detected by color flow Doppler.  LEFT VENTRICLE PLAX 2D LVIDd:         4.60 cm  Diastology LVIDs:         3.20 cm  LV e' medial:    7.18 cm/s LV PW:         1.10 cm  LV E/e' medial:  10.8 LV IVS:        0.70 cm  LV e' lateral:   8.05 cm/s LVOT diam:     2.00 cm  LV E/e' lateral: 9.6 LV SV:         42 LV SV Index:   21 LVOT Area:     3.14 cm  RIGHT VENTRICLE RV Basal diam:  3.60 cm LEFT ATRIUM         Index      RIGHT ATRIUM  Index LA diam:    4.30 cm 2.17 cm/m RA Area:     16.00 cm                                RA Volume:   37.90 ml  19.13 ml/m  AORTIC VALVE                   PULMONIC VALVE AV Area (Vmax):    2.13 cm    PV Vmax:       0.95 m/s AV Area (Vmean):   2.38 cm    PV Vmean:      66.500 cm/s AV Area (VTI):     2.54 cm    PV VTI:        0.217 m AV Vmax:           100.00 cm/s PV Peak grad:  3.6 mmHg AV Vmean:          59.900 cm/s PV Mean grad:  2.0 mmHg AV VTI:            0.166 m AV Peak Grad:      4.0 mmHg AV Mean Grad:      2.0 mmHg LVOT Vmax:         67.90 cm/s LVOT Vmean:        45.400 cm/s LVOT VTI:          0.134 m LVOT/AV VTI ratio: 0.81  AORTA Ao Root diam: 2.90 cm MITRAL VALVE MV Area (PHT): 5.58 cm    SHUNTS MV Area VTI:   1.99 cm    Systemic VTI:  0.13 m MV Peak grad:  4.9 mmHg    Systemic Diam: 2.00 cm MV Mean grad:  2.0 mmHg MV Vmax:       1.11 m/s MV Vmean:      67.3 cm/s MV Decel Time: 136 msec MV E velocity: 77.60 cm/s MV A velocity: 96.40 cm/s MV E/A ratio:  0.80 Bartholome Bill MD Electronically signed by Bartholome Bill MD Signature Date/Time: 12/27/2020/4:38:53 PM    Final    CT HEAD CODE STROKE WO CONTRAST  Result Date: 12/26/2020 CLINICAL DATA:  Code stroke.  Right arm and leg weakness EXAM: CT HEAD WITHOUT CONTRAST TECHNIQUE: Contiguous axial images were obtained from the base of the skull through the vertex without intravenous contrast. COMPARISON:  04/23/2012 FINDINGS: Brain: No acute finding  by CT. Few old small vessel cerebellar infarctions. Old infarction in the right occipital lobe, at the right parietal vertex, and at the left frontal convexity. Chronic small-vessel ischemic changes of the hemispheric white matter. No mass, hemorrhage, hydrocephalus or extra-axial collection. Vascular: There is atherosclerotic calcification of the major vessels at the base of the brain. Skull: Negative Sinuses/Orbits: Clear Other: None ASPECTS ([employer] Stroke Program Early CT Score) - Ganglionic level infarction (caudate, lentiform nuclei, internal capsule, insula, M1-M3 cortex): 7, allowing for the old infarction. - Supraganglionic infarction (M4-M6 cortex): 3, allowing for the old infarction. Total score (0-10 with 10 being normal): 10, allowing for the old infarction. IMPRESSION: 1. No acute finding by CT. 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 Chronic small-vessel ischemic changes of the white matter. 2. ASPECTS is 10, allowing for the old infarction. 3. These results were called by telephone at the time of interpretation on 12/26/2020 at 8:07 pm to provider Dr. Nickolas Madrid, Who verbally acknowledged these results. Electronically Signed   By: Jan Fireman.D.  On: 12/26/2020 20:09    Subjective: Patient was seen and examined.  Sitting in chair comfortably.  No new complaint.  Discharge Exam: Vitals:   12/29/20 1538 12/29/20 1611  BP: 122/79 122/79  Pulse: 79 79  Resp: 17 17  Temp: 98.6 F (37 C) 98.6 F (37 C)  SpO2: 99% 99%   Vitals:   12/29/20 0035 12/29/20 0724 12/29/20 1538 12/29/20 1611  BP: (!) 141/79 (!) 142/87 122/79 122/79  Pulse: 71 79 79 79  Resp:  16 17 17   Temp: 97.8 F (36.6 C) 98.5 F (36.9 C) 98.6 F (37 C) 98.6 F (37 C)  TempSrc:   Oral   SpO2: 98% 99% 99% 99%  Weight:      Height:        General: Pt is alert, awake, not in acute distress Cardiovascular: RRR, S1/S2 +, no rubs, no gallops Respiratory: CTA bilaterally, no wheezing, no rhonchi Abdominal: Soft,  NT, ND, bowel sounds + Extremities: no edema, no cyanosis   The results of significant diagnostics from this hospitalization (including imaging, microbiology, ancillary and laboratory) are listed below for reference.    Microbiology: Recent Results (from the past 240 hour(s))  SARS Coronavirus 2 by RT PCR (hospital order, performed in [facility] hospital lab) Nasopharyngeal Nasopharyngeal Swab     Status: None   Collection Time: 12/26/20  7:58 PM   Specimen: Nasopharyngeal Swab  Result Value Ref Range Status   SARS Coronavirus 2 NEGATIVE NEGATIVE Final    Comment: (NOTE) SARS-CoV-2 target nucleic acids are NOT DETECTED.  The SARS-CoV-2 RNA is generally detectable in upper and lower respiratory specimens during the acute phase of infection. The lowest concentration of SARS-CoV-2 viral copies this assay can detect is 250 copies / mL. A negative result does not preclude SARS-CoV-2 infection and should not be used as the sole basis for treatment or other patient management decisions.  A negative result may occur with improper specimen collection / handling, submission of specimen other than nasopharyngeal swab, presence of viral mutation(s) within the areas targeted by this assay, and inadequate number of viral copies (<250 copies / mL). A negative result must be combined with clinical observations, patient history, and epidemiological information.  Fact Sheet for Patients:   StrictlyIdeas.no  Fact Sheet for Healthcare Providers: BankingDealers.co.za  This test is not yet approved or  cleared by the [place] FDA and has been authorized for detection and/or diagnosis of SARS-CoV-2 by FDA under an Emergency Use Authorization (EUA).  This EUA will remain in effect (meaning this test can be used) for the duration of the COVID-19 declaration under Section 564(b)(1) of the Act, 21 U.S.C. section 360bbb-3(b)(1), unless the authorization  is terminated or revoked sooner.  Performed at [facility], [address] [place], [place] [address]   Gastrointestinal Panel by PCR , Stool     Status: None   Collection Time: 12/28/20 10:18 AM   Specimen: Stool  Result Value Ref Range Status   Campylobacter species NOT DETECTED NOT DETECTED Final   Plesimonas shigelloides NOT DETECTED NOT DETECTED Final   Salmonella species NOT DETECTED NOT DETECTED Final   Yersinia enterocolitica NOT DETECTED NOT DETECTED Final   Vibrio species NOT DETECTED NOT DETECTED Final   Vibrio cholerae NOT DETECTED NOT DETECTED Final   Enteroaggregative E coli (EAEC) NOT DETECTED NOT DETECTED Final   Enteropathogenic E coli (EPEC) NOT DETECTED NOT DETECTED Final   Enterotoxigenic E coli (ETEC) NOT DETECTED NOT DETECTED Final  Shiga like toxin producing E coli (STEC) NOT DETECTED NOT DETECTED Final   Shigella/Enteroinvasive E coli (EIEC) NOT DETECTED NOT DETECTED Final   Cryptosporidium NOT DETECTED NOT DETECTED Final   Cyclospora cayetanensis NOT DETECTED NOT DETECTED Final   Entamoeba histolytica NOT DETECTED NOT DETECTED Final   Giardia lamblia NOT DETECTED NOT DETECTED Final   Adenovirus F40/41 NOT DETECTED NOT DETECTED Final   Astrovirus NOT DETECTED NOT DETECTED Final   Norovirus GI/GII NOT DETECTED NOT DETECTED Final   Rotavirus A NOT DETECTED NOT DETECTED Final   Sapovirus (I, II, IV, and V) NOT DETECTED NOT DETECTED Final    Comment: Performed at [facility], [address] [place], [place] [address]     Labs: BNP (last 3 results) No results for input(s): BNP in the last 8760 hours. Basic Metabolic Panel: Recent Labs  Lab 12/26/20 1953 12/27/20 0350  NA 138 138  K 3.9 3.7  CL 102 105  CO2 24 24  GLUCOSE 102* 114*  BUN 30* 26*  CREATININE 1.68* 1.46*  CALCIUM 9.6 9.3   Liver Function Tests: Recent Labs  Lab 12/26/20 1953  AST 22  ALT 16  ALKPHOS 65  BILITOT 1.0  PROT 7.2  ALBUMIN 4.0   No results  for input(s): LIPASE, AMYLASE in the last 168 hours. No results for input(s): AMMONIA in the last 168 hours. CBC: Recent Labs  Lab 12/26/20 1953 12/27/20 0350 12/29/20 0543  WBC 6.3 5.9 4.4  NEUTROABS 4.6  --  2.3  HGB 10.6* 11.1* 11.2*  HCT 31.9* 32.6* 33.4*  MCV 99.4 98.5 98.5  PLT 86* 86* 85*   Cardiac Enzymes: No results for input(s): CKTOTAL, CKMB, CKMBINDEX, TROPONINI in the last 168 hours. BNP: Invalid input(s): POCBNP CBG: Recent Labs  Lab 12/26/20 1945  GLUCAP 89   D-Dimer No results for input(s): DDIMER in the last 72 hours. Hgb A1c Recent Labs    12/27/20 0350  HGBA1C 5.6   Lipid Profile Recent Labs    12/27/20 0350  CHOL 158  HDL 56  LDLCALC 87  TRIG 77  CHOLHDL 2.8   Thyroid function studies Recent Labs    12/28/20 1345  TSH 11.289*   Anemia work up Recent Labs    12/29/20 0543 12/29/20 0545  VITAMINB12  --  511  FOLATE 16.8  --    Urinalysis    Component Value Date/Time   COLORURINE STRAW (A) 12/26/2020 2232   APPEARANCEUR CLEAR (A) 12/26/2020 2232   APPEARANCEUR Clear 04/23/2012 2341   LABSPEC 1.004 (L) 12/26/2020 2232   LABSPEC 1.021 04/23/2012 2341   PHURINE 7.0 12/26/2020 2232   GLUCOSEU NEGATIVE 12/26/2020 2232   GLUCOSEU Negative 04/23/2012 2341   HGBUR NEGATIVE 12/26/2020 2232   BILIRUBINUR NEGATIVE 12/26/2020 2232   BILIRUBINUR negative 10/15/2017 1622   BILIRUBINUR Negative 04/23/2012 2341   KETONESUR NEGATIVE 12/26/2020 2232   PROTEINUR NEGATIVE 12/26/2020 2232   UROBILINOGEN 0.2 10/15/2017 1622   NITRITE NEGATIVE 12/26/2020 2232   LEUKOCYTESUR NEGATIVE 12/26/2020 2232   LEUKOCYTESUR Negative 04/23/2012 2341   Sepsis Labs Invalid input(s): PROCALCITONIN,  WBC,  LACTICIDVEN Microbiology Recent Results (from the past 240 hour(s))  SARS Coronavirus 2 by RT PCR (hospital order, performed in [place] [facility] hospital lab) Nasopharyngeal Nasopharyngeal Swab     Status: None   Collection Time: 12/26/20  7:58 PM    Specimen: Nasopharyngeal Swab  Result Value Ref Range Status   SARS Coronavirus 2 NEGATIVE NEGATIVE Final    Comment: (NOTE) SARS-CoV-2  target nucleic acids are NOT DETECTED.  The SARS-CoV-2 RNA is generally detectable in upper and lower respiratory specimens during the acute phase of infection. The lowest concentration of SARS-CoV-2 viral copies this assay can detect is 250 copies / mL. A negative result does not preclude SARS-CoV-2 infection and should not be used as the sole basis for treatment or other patient management decisions.  A negative result may occur with improper specimen collection / handling, submission of specimen other than nasopharyngeal swab, presence of viral mutation(s) within the areas targeted by this assay, and inadequate number of viral copies (<250 copies / mL). A negative result must be combined with clinical observations, patient history, and epidemiological information.  Fact Sheet for Patients:   StrictlyIdeas.no  Fact Sheet for Healthcare Providers: BankingDealers.co.za  This test is not yet approved or  cleared by the [place] FDA and has been authorized for detection and/or diagnosis of SARS-CoV-2 by FDA under an Emergency Use Authorization (EUA).  This EUA will remain in effect (meaning this test can be used) for the duration of the COVID-19 declaration under Section 564(b)(1) of the Act, 21 U.S.C. section 360bbb-3(b)(1), unless the authorization is terminated or revoked sooner.  Performed at [facility], [address] [place], [place] [address]   Gastrointestinal Panel by PCR , Stool     Status: None   Collection Time: 12/28/20 10:18 AM   Specimen: Stool  Result Value Ref Range Status   Campylobacter species NOT DETECTED NOT DETECTED Final   Plesimonas shigelloides NOT DETECTED NOT DETECTED Final   Salmonella species NOT DETECTED NOT DETECTED Final   Yersinia enterocolitica  NOT DETECTED NOT DETECTED Final   Vibrio species NOT DETECTED NOT DETECTED Final   Vibrio cholerae NOT DETECTED NOT DETECTED Final   Enteroaggregative E coli (EAEC) NOT DETECTED NOT DETECTED Final   Enteropathogenic E coli (EPEC) NOT DETECTED NOT DETECTED Final   Enterotoxigenic E coli (ETEC) NOT DETECTED NOT DETECTED Final   Shiga like toxin producing E coli (STEC) NOT DETECTED NOT DETECTED Final   Shigella/Enteroinvasive E coli (EIEC) NOT DETECTED NOT DETECTED Final   Cryptosporidium NOT DETECTED NOT DETECTED Final   Cyclospora cayetanensis NOT DETECTED NOT DETECTED Final   Entamoeba histolytica NOT DETECTED NOT DETECTED Final   Giardia lamblia NOT DETECTED NOT DETECTED Final   Adenovirus F40/41 NOT DETECTED NOT DETECTED Final   Astrovirus NOT DETECTED NOT DETECTED Final   Norovirus GI/GII NOT DETECTED NOT DETECTED Final   Rotavirus A NOT DETECTED NOT DETECTED Final   Sapovirus (I, II, IV, and V) NOT DETECTED NOT DETECTED Final    Comment: Performed at [facility], [address] [place],  [address]    Time coordinating discharge: Over 30 minutes  SIGNED:  Lorella Nimrod, MD  [facility] 12/29/2020, 4:21 PM  If 7PM-7AM, please contact night-coverage [email or web address]  This record has been created using Systems analyst. Errors have been sought and corrected,but may not always be located. Such creation errors do not reflect on the standard of care.

## 2020-12-29 NOTE — Progress Notes (Signed)
Inpatient Rehab Admissions Coordinator:   I have insurance approval for CIR and a bed available today.   Dr. Reesa Chew in agreement.  I will let pt/family and TOC team know.  Will arrange transport with [facility] to room 4w14.   Shann Medal, PT, DPT Admissions Coordinator [phone] 12/29/20  11:32 AM

## 2020-12-29 NOTE — Progress Notes (Signed)
Inpatient Rehabilitation Medication Review by a Pharmacist  A complete drug regimen review was completed for this patient to identify any potential clinically significant medication issues.  Clinically significant medication issues were identified:  yes   Type of Medication Issue Identified Description of Issue Urgent (address now) Non-Urgent (address on AM team rounds) Plan Plan Accepted by Provider? (Yes / No / Pending AM Rounds)                                Additional Drug Therapy Needed  B12 and MVI on dc summary but not ordered on tx Non-urgent Messaged PA   Other         Name of provider notified for urgent issues identified: Marlowe Shores, PA  Provider Method of Notification: secure chat   For non-urgent medication issues to be resolved on team rounds tomorrow morning a [facility] Secure Chat Handoff was sent to:    Pharmacist comments:   Time spent performing this drug regimen review (minutes):  5   Vivan Vanderveer 12/29/2020 6:01 PM

## 2020-12-29 NOTE — H&P (Incomplete)
Physical Medicine and Rehabilitation Admission H&P    Chief Complaint  Patient presents with  . Cerebrovascular Accident  : HPI: [name] is a 70 year old right-handed female with history of CVA 2004 with residual left lateral visual field deficits, history of DVT and antiphospholipid antibody syndrome diagnosed 2004 followed by Dr. Otilio Miu maintained on chronic Coumadin, CKD stage III, hypertension, hyperlipidemia, hypothyroidism and mild cognitive impairments maintained on Aricept.  Per chart review patient lives with spouse.  Multilevel home bed and bath main level 5 steps to entry.  Husband is limited physically to assist as personal care attendance and currently is admitted to [facility] as a renal transplant.  They do have a daughter here visiting from [place] at present.  Patient reportedly independent prior to admission.  Presented 12/26/2020 with acute onset of right leg weakness.  Cranial CT scan showed no acute findings.  Multiple old infarcts in the right occipital lobe at the right parietal vertex and at the left frontal convexity as well as chronic small vessel ischemic changes in the white matter.  Patient did not receive TPA.  MRI showed small acute infarct within the anterior right frontal lobe.  No hemorrhage or mass-effect.  Distal occlusion of the right PCA likely chronic given old PCA territory infarction.  Moderate stenosis of the left P4 segment.  Admission chemistries unremarkable except BUN 30 creatinine 1.68 glucose 102, urine drug screen negative, hemoglobin 11.1, platelets 86,000, INR 1.2..  Echocardiogram with ejection fraction of 55 to 60% no wall motion abnormalities.  Grade 1 diastolic dysfunction.  Neurology follow-up patient currently remains on chronic Coumadin therapy with Lovenox till INR therapeutic with goal INR of 2.5-3.5 as well as low-dose aspirin.  Follow-up oncology services for thrombocytopenia maintained on Coumadin therapy.  Peripheral  smear revealed no evidence of schistocytes and recommendations of routine follow-up CBC.  Bilateral lower extremity Dopplers negative.  Patient with multiple bouts of loose stool 3-4 for the past few weeks.  No abdominal pain or nausea vomiting.  Recommendations are for outpatient GI follow-up.  Orders for basic stool studies include GI pathogen panel with results pending and currently on enteric precautions.  Therapy evaluations completed recommendations inpatient rehab services secondary right side weakness and decreased functional ability as well as cognitive deficits.  Review of Systems  Constitutional: Negative for chills and fever.  HENT: Negative for hearing loss.   Eyes: Negative for blurred vision and double vision.       Left lateral visual field deficits after CVA 2004  Respiratory: Negative for cough and shortness of breath.   Cardiovascular: Positive for leg swelling. Negative for chest pain and palpitations.  Gastrointestinal: Positive for diarrhea. Negative for constipation, heartburn, melena, nausea and vomiting.       GERD  Genitourinary: Negative for dysuria, flank pain and hematuria.  Musculoskeletal: Positive for myalgias.  Skin: Negative for rash.  Neurological: Positive for weakness.  Psychiatric/Behavioral: Positive for depression and memory loss.  All other systems reviewed and are negative.  Past Medical History:  Diagnosis Date  . Arthritis 04/02/1996  . Depression   . GERD (gastroesophageal reflux disease) 04/03/2019  . Hyperlipidemia   . Hypertension   . Renal insufficiency   . Stroke ([place])   . Thyroid disease    Past Surgical History:  Procedure Laterality Date  . CESAREAN SECTION    . CHOLECYSTECTOMY    . COLONOSCOPY  2012   repeat in 10 yrs  . KNEE ARTHROSCOPY W/ MENISCAL  REPAIR Left    Family History  Problem Relation Age of Onset  . Stroke Father   . Breast cancer Maternal Grandmother   . Kidney cancer Mother   . Cancer Mother   . Arthritis  Brother    Social History:  reports that she has never smoked. She has never used smokeless tobacco. She reports current alcohol use of about 1.0 standard drink of alcohol per week. She reports that she does not use drugs. Allergies:  Allergies  Allergen Reactions  . Penicillin G Rash   Medications Prior to Admission  Medication Sig Dispense Refill  . atorvastatin (LIPITOR) 10 MG tablet Take 1 tablet (10 mg total) by mouth daily. 90 tablet 1  . donepezil (ARICEPT) 10 MG tablet Take 1 tablet by mouth daily.    Marland Kitchen escitalopram (LEXAPRO) 20 MG tablet Take 1 tablet (20 mg total) by mouth daily. 90 tablet 0  . famotidine (PEPCID) 20 MG tablet Take 1 tablet by mouth daily. singh    . ferrous sulfate 325 (65 FE) MG EC tablet Take 1 tablet (325 mg total) by mouth daily with breakfast. (Patient taking differently: Take 325 mg by mouth at bedtime.) 30 tablet 3  . levothyroxine (SYNTHROID) 75 MCG tablet Take 1 tablet (75 mcg total) by mouth daily. 30 tablet 5  . losartan (COZAAR) 25 MG tablet Take 25 mg by mouth daily.    . montelukast (SINGULAIR) 10 MG tablet TAKE 1 TABLET BY MOUTH EVERYDAY AT BEDTIME 90 tablet 3  . Multiple Vitamins-Minerals (CENTRUM SILVER 50+WOMEN PO) Take 1 tablet by mouth daily.    . solifenacin (VESICARE) 5 MG tablet Take 5 mg by mouth daily.    Marland Kitchen torsemide (DEMADEX) 5 MG tablet Take 1 tablet by mouth daily. singh    . traZODone (DESYREL) 50 MG tablet Take 1 tablet (50 mg total) by mouth daily. 30 tablet 5  . vitamin B-12 (CYANOCOBALAMIN) 1000 MCG tablet Take 1,000 mcg by mouth daily.    Marland Kitchen warfarin (COUMADIN) 2 MG tablet TAKE 1 TABLET BY MOUTH EVERY DAY (Patient taking differently: Take 2 mg by mouth See admin instructions. Take 1 tablet on Tuesday, Thursday, Saturday and  Sunday.) 90 tablet 0  . warfarin (COUMADIN) 3 MG tablet TAKE 1 TABLET BY MOUTH EVERY DAY (Patient taking differently: Take 3 mg by mouth See admin instructions. Take 1 tablet on Monday, Wednesday, and Friday.)  90 tablet 0  . mupirocin ointment (BACTROBAN) 2 % Place 1 application into the nose 2 (two) times daily. 22 g 0    Drug Regimen Review Drug regimen was reviewed and remains appropriate with no significant issues identified  Home: Home Living Family/patient expects to be discharged to:: Private residence Living Arrangements: Spouse/significant other Available Help at Discharge: Family,Available 24 hours/day,Personal care attendant,Available PRN/intermittently (Pt and PCA caregive for husbnad - unclear if he can provide much physical assistance) Home Access: Stairs to enter Entrance Stairs-Number of Steps: 5 steps to enter, or full flight from garage. Home Layout: Multi-level,Able to live on main level with bedroom/bathroom Alternate Level Stairs-Number of Steps: garage is in basement full flight to main level; could enter at front door (5 steps with railings) Home Equipment: Hospital bed (eqipment for her husband)   Functional History: Prior Function Level of Independence: Independent Comments: Caregiver of her husband  Functional Status:  Mobility: Bed Mobility Overal bed mobility: Needs Assistance Bed Mobility: Supine to Sit,Sit to Supine Supine to sit: Mod assist Sit to supine: Max assist Transfers Overall transfer  level: Needs assistance Equipment used: Rolling walker (2 wheeled) Transfers: Sit to/from [employer] Sit to Stand: Max assist,From elevated surface Stand pivot transfers: Max assist General transfer comment:  (Therapy session involved several transfers bed to chair, chair <> BSC with MaxA transferring toward Left side. Nursing present and educated on safe transfers.)      ADL: ADL Overall ADL's : Needs assistance/impaired General ADL Comments: Pt. was independent with self-feeding skills with complete set-up, requiring increased time to complete the task, and cues for strategies when using her right hand during the task, as well as cues for  posiiton of the utensil in the right hand.  Cognition: Cognition Overall Cognitive Status: Within Functional Limits for tasks assessed Orientation Level: Oriented X4 Cognition Arousal/Alertness: Awake/alert Behavior During Therapy: WFL for tasks assessed/performed Overall Cognitive Status: Within Functional Limits for tasks assessed General Comments: Poor insight into deficits; poor problem solving related to spacial awareness limitations; has baseline visual field deficits from prior CVA  Physical Exam: Blood pressure (!) 142/87, pulse 79, temperature 98.5 F (36.9 C), resp. rate 16, height 5\' 8"  (1.727 m), weight 85 kg, SpO2 99 %. Physical Exam Neurological:     Comments: Patient is awake alert in no acute distress. Speech is fluent.  Oriented to person and place.     Results for orders placed or performed during the hospital encounter of 12/26/20 (from the past 48 hour(s))  Pathologist smear review     Status: None   Collection Time: 12/28/20  3:50 AM  Result Value Ref Range   Path Review Blood smear is reviewed.     Comment: Morphology of RBCs, WBCs, and platelets within normal limits. No evidence of circulating blasts or schistocytes. Reviewed by Elmon Kirschner, M.D. Performed at [facility], [address] [place], [place] [address]   TSH     Status: Abnormal   Collection Time: 12/28/20  1:45 PM  Result Value Ref Range   TSH 11.289 (H) 0.350 - 4.500 uIU/mL    Comment: Performed by a 3rd Generation assay with a functional sensitivity of <=0.01 uIU/mL. Performed at [facility], [address] [place], [place] [address]   T4, free     Status: None   Collection Time: 12/28/20  1:45 PM  Result Value Ref Range   Free T4 0.85 0.61 - 1.12 ng/dL    Comment: (NOTE) Biotin ingestion may interfere with free T4 tests. If the results are inconsistent with the TSH level, previous test results, or the clinical presentation, then consider biotin  interference. If needed, order repeat testing after stopping biotin. Performed at [facility], [address] [place], [place] [address]   CBC with Differential     Status: Abnormal   Collection Time: 12/29/20  5:43 AM  Result Value Ref Range   WBC 4.4 4.0 - 10.5 K/uL   RBC 3.39 (L) 3.87 - 5.11 MIL/uL   Hemoglobin 11.2 (L) 12.0 - 15.0 g/dL   HCT 33.4 (L) 36.0 - 46.0 %   MCV 98.5 80.0 - 100.0 fL   MCH 33.0 26.0 - 34.0 pg   MCHC 33.5 30.0 - 36.0 g/dL   RDW 12.2 11.5 - 15.5 %   Platelets 85 (L) 150 - 400 K/uL   nRBC 0.0 0.0 - 0.2 %   Neutrophils Relative % 52 %   Neutro Abs 2.3 1.7 - 7.7 K/uL   Lymphocytes Relative 35 %   Lymphs Abs 1.5 0.7 - 4.0 K/uL  Monocytes Relative 8 %   Monocytes Absolute 0.4 0.1 - 1.0 K/uL   Eosinophils Relative 3 %   Eosinophils Absolute 0.1 0.0 - 0.5 K/uL   Basophils Relative 1 %   Basophils Absolute 0.0 0.0 - 0.1 K/uL   Immature Granulocytes 1 %   Abs Immature Granulocytes 0.02 0.00 - 0.07 K/uL    Comment: Performed at [facility], [address] [place], [place] [address]  Immature Platelet Fraction     Status: None   Collection Time: 12/29/20  5:43 AM  Result Value Ref Range   Immature Platelet Fraction 6.2 1.2 - 8.6 %    Comment: Performed at [facility], [address] [place], [place] [address]  Folate, serum, performed at [facility] lab     Status: None   Collection Time: 12/29/20  5:43 AM  Result Value Ref Range   Folate 16.8 >5.9 ng/mL    Comment: Performed at [facility], [address] [place], [place] [address]   US Venous Img Lower Bilateral (DVT)  Result Date: 12/27/2020 CLINICAL DATA:  Bilateral leg pain EXAM: BILATERAL LOWER EXTREMITY VENOUS DOPPLER ULTRASOUND TECHNIQUE: Gray-scale sonography with graded compression, as well as color Doppler and duplex ultrasound were performed to evaluate the lower extremity deep venous systems from the level of the common femoral vein and including  the common femoral, femoral, profunda femoral, popliteal and calf veins including the posterior tibial, peroneal and gastrocnemius veins when visible. The superficial great saphenous vein was also interrogated. Spectral Doppler was utilized to evaluate flow at rest and with distal augmentation maneuvers in the common femoral, femoral and popliteal veins. COMPARISON:  None. FINDINGS: RIGHT LOWER EXTREMITY Common Femoral Vein: No evidence of thrombus. Normal compressibility, respiratory phasicity and response to augmentation. Saphenofemoral Junction: No evidence of thrombus. Normal compressibility and flow on color Doppler imaging. Profunda Femoral Vein: No evidence of thrombus. Normal compressibility and flow on color Doppler imaging. Femoral Vein: No evidence of thrombus. Normal compressibility, respiratory phasicity and response to augmentation. Popliteal Vein: No evidence of thrombus. Normal compressibility, respiratory phasicity and response to augmentation. Calf Veins: No evidence of thrombus. Normal compressibility and flow on color Doppler imaging. Superficial Great Saphenous Vein: No evidence of thrombus. Normal compressibility. Venous Reflux:  None. Other Findings:  None. LEFT LOWER EXTREMITY Common Femoral Vein: No evidence of thrombus. Normal compressibility, respiratory phasicity and response to augmentation. Saphenofemoral Junction: No evidence of thrombus. Normal compressibility and flow on color Doppler imaging. Profunda Femoral Vein: No evidence of thrombus. Normal compressibility and flow on color Doppler imaging. Femoral Vein: No evidence of thrombus. Normal compressibility, respiratory phasicity and response to augmentation. Popliteal Vein: No evidence of thrombus. Normal compressibility, respiratory phasicity and response to augmentation. Calf Veins: No evidence of thrombus. Normal compressibility and flow on color Doppler imaging. Superficial Great Saphenous Vein: No evidence of thrombus. Normal  compressibility. Venous Reflux:  None. Other Findings:  None. IMPRESSION: No evidence of deep venous thrombosis in either lower extremity. Electronically Signed   By: Inez Catalina M.D.   On: 12/27/2020 11:58   ECHOCARDIOGRAM COMPLETE  Result Date: 12/27/2020    ECHOCARDIOGRAM REPORT   Patient Name:   [name] Date of Exam: 12/27/2020 Medical Rec #:  [id number]          Height:       68.0 in Accession #:    [id number]         Weight:       186.0 lb Date  of Birth:  [birth year]         BSA:          1.982 m Patient Age:    72 years           BP:           162/80 mmHg Patient Gender: F                  HR:           74 bpm. Exam Location:  [facility] Procedure: 2D Echo, Color Doppler, Cardiac Doppler and Intracardiac            Opacification Agent Indications:     I163.9 Stroke  History:         Patient has no prior history of Echocardiogram examinations.                  Risk Factors:Hypertension and Dyslipidemia.  Sonographer:     Charmayne Sheer RDCS (AE) Referring Phys:  [id number] [facility] AMIN Diagnosing Phys: Bartholome Bill MD  Sonographer Comments: Suboptimal apical window. Image acquisition challenging due to patient body habitus. IMPRESSIONS  1. Left ventricular ejection fraction, by estimation, is 55 to 60%. The left ventricle has normal function. The left ventricle has no regional wall motion abnormalities. Left ventricular diastolic parameters are consistent with Grade I diastolic dysfunction (impaired relaxation).  2. Right ventricular systolic function is normal. The right ventricular size is normal.  3. Left atrial size was mildly dilated.  4. The mitral valve is grossly normal. Trivial mitral valve regurgitation.  5. The aortic valve was not well visualized. Aortic valve regurgitation is trivial. FINDINGS  Left Ventricle: Left ventricular ejection fraction, by estimation, is 55 to 60%. The left ventricle has normal function. The left ventricle has no regional wall motion abnormalities. Definity contrast  agent was given IV to delineate the left ventricular  endocardial borders. The left ventricular internal cavity size was normal in size. There is no left ventricular hypertrophy. Left ventricular diastolic parameters are consistent with Grade I diastolic dysfunction (impaired relaxation). Right Ventricle: The right ventricular size is normal. No increase in right ventricular wall thickness. Right ventricular systolic function is normal. Left Atrium: Left atrial size was mildly dilated. Right Atrium: Right atrial size was normal in size. Pericardium: There is no evidence of pericardial effusion. Mitral Valve: The mitral valve is grossly normal. Trivial mitral valve regurgitation. MV peak gradient, 4.9 mmHg. The mean mitral valve gradient is 2.0 mmHg. Tricuspid Valve: The tricuspid valve is not well visualized. Tricuspid valve regurgitation is trivial. Aortic Valve: The aortic valve was not well visualized. Aortic valve regurgitation is trivial. Aortic valve mean gradient measures 2.0 mmHg. Aortic valve peak gradient measures 4.0 mmHg. Aortic valve area, by VTI measures 2.54 cm. Pulmonic Valve: The pulmonic valve was not well visualized. Pulmonic valve regurgitation is trivial. Aorta: The aortic root is normal in size and structure. IAS/Shunts: No atrial level shunt detected by color flow Doppler.  LEFT VENTRICLE PLAX 2D LVIDd:         4.60 cm  Diastology LVIDs:         3.20 cm  LV e' medial:    7.18 cm/s LV PW:         1.10 cm  LV E/e' medial:  10.8 LV IVS:        0.70 cm  LV e' lateral:   8.05 cm/s LVOT diam:     2.00 cm  LV E/e' lateral: 9.6 LV  SV:         42 LV SV Index:   21 LVOT Area:     3.14 cm  RIGHT VENTRICLE RV Basal diam:  3.60 cm LEFT ATRIUM         Index      RIGHT ATRIUM           Index LA diam:    4.30 cm 2.17 cm/m RA Area:     16.00 cm                                RA Volume:   37.90 ml  19.13 ml/m  AORTIC VALVE                   PULMONIC VALVE AV Area (Vmax):    2.13 cm    PV Vmax:       0.95  m/s AV Area (Vmean):   2.38 cm    PV Vmean:      66.500 cm/s AV Area (VTI):     2.54 cm    PV VTI:        0.217 m AV Vmax:           100.00 cm/s PV Peak grad:  3.6 mmHg AV Vmean:          59.900 cm/s PV Mean grad:  2.0 mmHg AV VTI:            0.166 m AV Peak Grad:      4.0 mmHg AV Mean Grad:      2.0 mmHg LVOT Vmax:         67.90 cm/s LVOT Vmean:        45.400 cm/s LVOT VTI:          0.134 m LVOT/AV VTI ratio: 0.81  AORTA Ao Root diam: 2.90 cm MITRAL VALVE MV Area (PHT): 5.58 cm    SHUNTS MV Area VTI:   1.99 cm    Systemic VTI:  0.13 m MV Peak grad:  4.9 mmHg    Systemic Diam: 2.00 cm MV Mean grad:  2.0 mmHg MV Vmax:       1.11 m/s MV Vmean:      67.3 cm/s MV Decel Time: 136 msec MV E velocity: 77.60 cm/s MV A velocity: 96.40 cm/s MV E/A ratio:  0.80 Bartholome Bill MD Electronically signed by Bartholome Bill MD Signature Date/Time: 12/27/2020/4:38:53 PM    Final        Medical Problem List and Plan: 1.  Right leg weakness secondary to small acute infarct within the anterior right frontal lobe.  Multiple old infarcts and chronic small vessel ischemia.  -patient may *** shower  -ELOS/Goals: *** 2.  Antithrombotics: -DVT/anticoagulation: Chronic Coumadin therapy.  Continue Lovenox till INR therapeutic 2.5-3.5  -antiplatelet therapy: Aspirin 81 mg daily 3. Pain Management: Tylenol as needed 4. Mood: Aricept 10 mg daily, Lexapro 20 mg daily  -antipsychotic agents: N/A 5. Neuropsych: This patient is capable of making decisions on her own behalf. 6. Skin/Wound Care: Routine skin checks 7. Fluids/Electrolytes/Nutrition: Routine in and outs with follow-up chemistries 8.  Antiphospholipid antibody syndrome/thrombocytopenia.  Follow-up hematology services.  Follow-up CBC.  Continue Coumadin therapy 9.  Hypertension.  Cozaar 25 mg daily.  Monitor with increased mobility 10.  Hypothyroidism.  Synthroid 11.  Hyperlipidemia.  Lipitor 12.  CKD stage III.  Follow-up chemistries 13.  Diarrhea.  Patient was  treated for loose stools  daily for the past few weeks.  Plan outpatient follow-up GI.  Basic stool studies including GI pathogen panel fecal lactoferrin TSH and free T4 pending.  Maintain precautions until work-up completed.  Continue Imodium as needed ***  Cathlyn Parsons, PA-C 12/29/2020

## 2020-12-29 NOTE — PMR Pre-admission (Signed)
PMR Admission Coordinator Pre-Admission Assessment  Patient: [name] is an 70 y.o., female MRN: [id number] DOB: July 15, 1951 Height: _0  (172.7 cm) Weight: 85 kg  Insurance Information HMO:     PPO: yes     PCP:      IPA:      80/20:      OTHER:  PRIMARY: [employer] Medicare      Policy#: [id number]      Subscriber: pt CM Name: n/a      Phone#: [phone]     Fax#: [phone] Pre-Cert#: [id number] auth for CIR for 1/27 admit with updates due 2/2 to fax number listed above      Employer: n/a Benefits:  Phone #: [phone]     Name:  Eff. Date: 12/03/20     Deduct: $0      Out of Pocket Max: $3900 ($0 met)      Life Max: n/a CIR: $295/day for days 1-5      SNF: 20 full days Outpatient:      Co-Pay: $20/visit Home Health: 100%      Co-Pay:  DME: 80%     Co-ins: 20% Providers: preferred network SECONDARY: [employer]      Policy#:      Phone#:   Development worker, community:       Phone#:   [employer], [employer]" for patients in Inpatient Rehabilitation Facilities with attached "Privacy Act [place] Records" was provided and verbally reviewed with: Patient  Emergency Contact Information Contact Information    Name Relation Home Work Mobile   [place] Daughter [phone]     Kieren, Ricci Sister   [phone]   Shirlena, Brinegar Spouse [phone]        Current Medical History  Patient Admitting Diagnosis: CVA   History of Present Illness: [name] is a 70 year old right-handed female with history of CVA 2004 (residual left homonymous hemianopsia), history of DVT, and antiphospholipid antibody syndrome (diagnosed 2004 followed by Dr. Otilio Miu and maintained on chronic Coumadin), CKD stage III, hypertension, hyperlipidemia, hypothyroidism and mild cognitive impairments maintained on Aricept.  Presented 12/26/2020 with acute onset of right leg weakness.  Cranial CT scan showed no acute findings.  Multiple old infarcts in the right  occipital lobe at the right parietal vertex and at the left frontal convexity as well as chronic small vessel ischemic changes in the white matter.  Patient did not receive TPA.  MRI showed small acute infarct within the anterior right frontal lobe.  No hemorrhage or mass-effect.  Distal occlusion of the right PCA likely chronic given old PCA territory infarction.  Moderate stenosis of the left P4 segment.  Admission chemistries unremarkable except BUN 30 creatinine 1.68 glucose 102, urine drug screen negative, hemoglobin 11.1, platelets 86,000, INR 1.2..  Echocardiogram with ejection fraction of 55 to 60% no wall motion abnormalities.  Grade 1 diastolic dysfunction.  Neurology follow-up patient currently remains on chronic Coumadin therapy with Lovenox till INR therapeutic with goal INR of 2.5-3.5 as well as low-dose aspirin.  Follow-up oncology services for thrombocytopenia maintained on Coumadin therapy.  Peripheral smear revealed no evidence of schistocytes and recommendations of routine follow-up CBC.  Bilateral lower extremity Dopplers negative.  Patient with multiple bouts of loose stool 3-4 for the past few weeks.  No abdominal pain or nausea vomiting.  Recommendations are for outpatient GI follow-up.  Orders for basic stool studies include GI pathogen panel with results pending and currently on enteric precautions.  Therapy evaluations completed recommendations inpatient  rehab services secondary right side weakness and decreased functional ability.  Complete NIHSS TOTAL: 6  Patient's medical record from [facility] has been reviewed by the rehabilitation admission coordinator and physician.  Past Medical History  Past Medical History:  Diagnosis Date  . Arthritis 04/02/1996  . Depression   . GERD (gastroesophageal reflux disease) 04/03/2019  . Hyperlipidemia   . Hypertension   . Renal insufficiency   . Stroke ([place])   . Thyroid disease     Family History   family history includes Arthritis in  her brother; Breast cancer in her maternal grandmother; Cancer in her mother; Kidney cancer in her mother; Stroke in her father.  Prior Rehab/Hospitalizations Has the patient had prior rehab or hospitalizations prior to admission? No  Has the patient had major surgery during 100 days prior to admission? No   Current Medications  Current Facility-Administered Medications:  .   stroke: mapping our early stages of recovery book, , Does not apply, Once, Lenore Cordia, MD .  acetaminophen (TYLENOL) tablet 650 mg, 650 mg, Oral, Q4H PRN, 650 mg at 12/29/20 0854 **OR** acetaminophen (TYLENOL) 160 MG/5ML solution 650 mg, 650 mg, Per Tube, Q4H PRN **OR** acetaminophen (TYLENOL) suppository 650 mg, 650 mg, Rectal, Q4H PRN, Zada Finders R, MD .  aspirin chewable tablet 81 mg, 81 mg, Oral, Daily, Zada Finders R, MD, 81 mg at 12/29/20 0956 .  atorvastatin (LIPITOR) tablet 80 mg, 80 mg, Oral, Daily, Lorella Nimrod, MD, 80 mg at 12/29/20 0955 .  donepezil (ARICEPT) tablet 10 mg, 10 mg, Oral, Daily, Zada Finders R, MD, 10 mg at 12/29/20 0956 .  [START ON 12/30/2020] enoxaparin (LOVENOX) injection 80 mg, 80 mg, Subcutaneous, Q12H, Amin, Sumayya, MD .  escitalopram (LEXAPRO) tablet 20 mg, 20 mg, Oral, Daily, Zada Finders R, MD, 20 mg at 12/29/20 0956 .  famotidine (PEPCID) tablet 20 mg, 20 mg, Oral, Daily, Lorella Nimrod, MD, 20 mg at 12/29/20 0956 .  ferrous sulfate tablet 325 mg, 325 mg, Oral, QHS, Amin, Soundra Pilon, MD, 325 mg at 12/28/20 2036 .  [START ON 12/30/2020] levothyroxine (SYNTHROID) tablet 100 mcg, 100 mcg, Oral, Q0600, Lorella Nimrod, MD .  loperamide (IMODIUM) capsule 2 mg, 2 mg, Oral, Q6H PRN, Lorella Nimrod, MD, 2 mg at 12/28/20 1047 .  losartan (COZAAR) tablet 25 mg, 25 mg, Oral, Daily, Lorella Nimrod, MD, 25 mg at 12/29/20 0956 .  montelukast (SINGULAIR) tablet 10 mg, 10 mg, Oral, QHS, Amin, Soundra Pilon, MD, 10 mg at 12/28/20 2036 .  senna-docusate (Senokot-S) tablet 1 tablet, 1 tablet, Oral, QHS PRN,  Zada Finders R, MD .  traZODone (DESYREL) tablet 50 mg, 50 mg, Oral, QHS, Amin, Soundra Pilon, MD, 50 mg at 12/28/20 2036  Patients Current Diet:  Diet Order            Diet Heart Room service appropriate? Yes; Fluid consistency: Thin  Diet effective now                 Precautions / Restrictions Precautions Precautions: Fall Precaution Comments: decreased L visual field Restrictions Weight Bearing Restrictions: No   Has the patient had 2 or more falls or a fall with injury in the past year? No  Prior Activity Level Limited Community (1-2x/wk): fully independent, no DME used, caring for her husband who is s/p kidney transplant and not in good health.  Not driving due to L hemonymous hemianopsia from previous stroke  Prior Functional Level Self Care: Did the patient need help bathing, dressing, using the toilet  or eating? Independent  Indoor Mobility: Did the patient need assistance with walking from room to room (with or without device)? Independent  Stairs: Did the patient need assistance with internal or external stairs (with or without device)? Independent  Functional Cognition: Did the patient need help planning regular tasks such as shopping or remembering to take medications? Independent  Home Assistive Devices / Equipment Home Assistive Devices/Equipment: None Home Equipment: Hospital bed (eqipment for her husband)  Prior Device Use: Indicate devices/aids used by the patient prior to current illness, exacerbation or injury? None of the above  Current Functional Level Cognition  Overall Cognitive Status: Within Functional Limits for tasks assessed Orientation Level: Oriented X4 General Comments: Poor insight into deficits; poor problem solving related to spacial awareness limitations; has baseline visual field deficits from prior CVA    Extremity Assessment (includes Sensation/Coordination)  Upper Extremity Assessment: RUE deficits/detail RUE Deficits / Details: 4/5  grossly. Decreased [place] RUE Coordination: decreased fine motor  Lower Extremity Assessment: RLE deficits/detail,Generalized weakness RLE Deficits / Details: 0/5 ankle; 3/5 SLR, absent heel raise; PROM reveals tone in ankle extensors, ton in hip extensors 2/4 especially after 90 degrees of hip flexion RLE Sensation: WNL RLE Coordination: decreased gross motor    ADLs  Overall ADL's : Needs assistance/impaired General ADL Comments: Pt. was independent with self-feeding skills with complete set-up, requiring increased time to complete the task, and cues for strategies when using her right hand during the task, as well as cues for posiiton of the utensil in the right hand.    Mobility  Overal bed mobility: Needs Assistance Bed Mobility: Supine to Sit,Sit to Supine Supine to sit: Mod assist Sit to supine: Max assist    Transfers  Overall transfer level: Needs assistance Equipment used: Rolling walker (2 wheeled) Transfers: Sit to/from [employer] Sit to Stand: Max assist,From elevated surface Stand pivot transfers: Max assist General transfer comment:  (Therapy session involved several transfers bed to chair, chair <> BSC with MaxA transferring toward Left side. Nursing present and educated on safe transfers.)    Ambulation / Gait / Stairs / Office manager / Balance Balance Overall balance assessment: Needs assistance Sitting-balance support: Bilateral upper extremity supported,Feet supported Sitting balance-Leahy Scale: Poor Postural control: Posterior lean,Right lateral lean Standing balance support: Bilateral upper extremity supported Standing balance-Leahy Scale: Zero    Special needs/care consideration Designated visitor [place] (from acute therapy documentation) Living Arrangements: Spouse/significant other Available Help at Discharge: Family,Available 24 hours/day,Personal care attendant,Available  PRN/intermittently (Pt and PCA caregive for husbnad - unclear if he can provide much physical assistance) Home Layout: Multi-level,Able to live on main level with bedroom/bathroom Alternate Level Stairs-Number of Steps: garage is in basement full flight to main level; could enter at front door (5 steps with railings) Home Access: Stairs to enter Entrance Stairs-Number of Steps: 5 steps to enter, or full flight from garage. Home Care Services: No  Discharge Living Setting Plans for Discharge Living Setting: Patient's home Type of Home at Discharge: House Discharge Home Layout: One level Discharge Home Access: Stairs to enter Entrance Stairs-Rails: Can reach both Entrance Stairs-Number of Steps: 4 Discharge Bathroom Shower/Tub: Tub/shower unit,Walk-in shower Discharge Bathroom Toilet: Standard Discharge Bathroom Accessibility: Yes How Accessible: Accessible via wheelchair Does the patient have any problems obtaining your medications?: No  Social/Family/Support Systems Patient Roles: Spouse,Caregiver Anticipated Caregiver: daughter, Mavi Un Anticipated Caregiver's Contact Information: [phone] Ability/Limitations of Caregiver: min assist for  mobility Caregiver Availability: 24/7 Discharge Plan Discussed with Primary Caregiver: Yes Is Caregiver In Agreement with Plan?: Yes Does Caregiver/Family have Issues with Lodging/Transportation while Pt is in Rehab?: No  Goals Patient/Family Goal for Rehab: PT/OT supervision to min assist with LRAD, SLP n/a Expected length of stay: 18-21 days Additional Information: Pt's daughter plans to move from [facility] to provide care for pt at d/c from CIR Pt/Family Agrees to Admission and willing to participate: Yes Program Orientation Provided & Reviewed with Pt/Caregiver Including Roles  & Responsibilities: Yes  Barriers to Discharge: Insurance for SNF coverage  Decrease burden of Care through IP rehab admission: n/a  Possible need for SNF  placement upon discharge: Not anticipated.   Patient Condition: I have reviewed medical records from [facility], spoken with CSW, and patient and family member. I discussed via phone for inpatient rehabilitation assessment.  Patient will benefit from ongoing PT and OT, can actively participate in 3 hours of therapy a day 5 days of the week, and can make measurable gains during the admission.  Patient will also benefit from the coordinated team approach during an Inpatient Acute Rehabilitation admission.  The patient will receive intensive therapy as well as Rehabilitation physician, nursing, social worker, and care management interventions.  Due to safety, disease management, medication administration and patient education the patient requires 24 hour a day rehabilitation nursing.  The patient is currently max assist with mobility and basic ADLs.  Discharge setting and therapy post discharge at home with home health is anticipated.  Patient has agreed to participate in the Acute Inpatient Rehabilitation Program and will admit today.  Preadmission Screen Completed By:  Michel Santee, PT, DPT 12/29/2020 11:50 AM ______________________________________________________________________   Discussed status with Dr. Dagoberto Ligas on 12/29/20  at 12:00 PM  and received approval for admission today.  Admission Coordinator:  Michel Santee, PT, DPT time 12:00 PM Sudie Grumbling 12/29/20    Assessment/Plan: Diagnosis: 1. Does the need for close, 24 hr/day Medical supervision in concert with the patient's rehab needs make it unreasonable for this patient to be served in a less intensive setting? Yes 2. Co-Morbidities requiring supervision/potential complications: antiphospholipid Ab syndrome, previous stroke with visual deficits, CKD3B, new stroke with R hemi 3. Due to bladder management, bowel management, safety, disease management, medication administration, pain management and patient education, does the patient require 24 hr/day  rehab nursing? Yes 4. Does the patient require coordinated care of a physician, rehab nurse, PT, OT, and SLP to address physical and functional deficits in the context of the above medical diagnosis(es)? Yes Addressing deficits in the following areas: balance, endurance, locomotion, strength, transferring, bowel/bladder control, bathing, dressing, feeding, grooming and toileting 5. Can the patient actively participate in an intensive therapy program of at least 3 hrs of therapy 5 days a week? Yes 6. The potential for patient to make measurable gains while on inpatient rehab is good 7. Anticipated functional outcomes upon discharge from inpatient rehab: supervision and min assist PT, supervision and min assist OT, n/a SLP 8. Estimated rehab length of stay to reach the above functional goals is: 18-21 days  9. Anticipated discharge destination: Home 10. Overall Rehab/Functional Prognosis: good   MD Signature:

## 2020-12-29 NOTE — TOC Progression Note (Signed)
Transition of Care [facility]) - Progression Note    Patient Details  Name: [name] MRN: [id number] Date of Birth: 04/10/1951  Transition of [facility]) CM/SW [place], LCSW Phone Number: 12/29/2020, 9:15 AM  Clinical Narrative:   Patient's daughter has questions about CIR. CSW asked CIR Case Manager Urban Gibson to follow up with daughter. CIR insurance authorization pending.         Expected Discharge Plan and Services                                                 Social Determinants of Health (SDOH) Interventions    Readmission Risk Interventions No flowsheet data found.

## 2020-12-29 NOTE — Progress Notes (Signed)
Courtney Heys, MD  Physician  Physical Medicine and Rehabilitation  PMR Pre-admission    Signed  Date of Service:  12/29/2020 11:26 AM      Related encounter: ED to Hosp-Admission (Discharged) from 12/26/2020 in [facility] (1A)       Signed         Show:Clear all '[x]' Manual'[x]' Template'[x]' Copied  Added by: '[x]' Lovorn, Megan, MD'[x]' Michel Santee, PT   '[]' Hover for details  PMR Admission Coordinator Pre-Admission Assessment  Patient: [name] is an 70 y.o., female MRN: [id number] DOB: [birth year] Height: '5\' 8"'  (172.7 cm) Weight: 85 kg  Insurance Information HMO:     PPO: yes     PCP:      IPA:      80/20:      OTHER:  PRIMARY: U[employer] Medicare      Policy#: [id number]      Subscriber: pt CM Name: n/a      Phone#: [phone]     Fax#: [phone] Pre-Cert#: [id number] auth for CIR for 1/27 admit with updates due 2/2 to fax number listed above      Employer: n/a Benefits:  Phone #: [phone]     Name:  Eff. Date: 12/03/20     Deduct: $0      Out of Pocket Max: $3900 ($0 met)      Life Max: n/a CIR: $295/day for days 1-5      SNF: 20 full days Outpatient:      Co-Pay: $20/visit Home Health: 100%      Co-Pay:  DME: 80%     Co-ins: 20% Providers: preferred network SECONDARY: A[employer]      Policy#:      Phone#:   Development worker, community:       Phone#:   [employer] Engineer, [employer]" for patients in Inpatient Rehabilitation Facilities with attached "Privacy Act [place] Records" was provided and verbally reviewed with: Patient  Emergency Contact Information         Contact Information    Name Relation Home Work Mobile   [place] Daughter [place], Pat Sister   [phone]   Dymphna, Wadley Spouse [phone]        Current Medical History  Patient Admitting Diagnosis: CVA   History of Present Illness: Kathryn Prentiss Hollowoodis a 70 year old right-handed  female with history of CVA 2004 (residual left homonymous hemianopsia), history of DVT, and antiphospholipid antibody syndrome (diagnosed 2004 followed by Dr. Otilio Miu and maintained on chronic Coumadin), CKD stage III, hypertension, hyperlipidemia, hypothyroidism and mild cognitive impairments maintained on Aricept. Presented 12/26/2020 with acute onset of right leg weakness. Cranial CT scan showed no acute findings. Multiple old infarcts in the right occipital lobe at the right parietal vertex and at the left frontal convexity as well as chronic small vessel ischemic changes in the white matter. Patient did not receive TPA. MRI showed small acute infarct within the anterior right frontal lobe. No hemorrhage or mass-effect. Distal occlusion of the right PCA likely chronic given old PCA territory infarction. Moderate stenosis of the left P4 segment. Admission chemistries unremarkable except BUN 30 creatinine 1.68 glucose 102, urine drug screen negative, hemoglobin 11.1, platelets 86,000, INR 1.2.. Echocardiogram with ejection fraction of 55 to 60% no wall motion abnormalities. Grade 1 diastolic dysfunction. Neurology follow-up patient currently remains on chronic Coumadin therapy with Lovenox till INR therapeutic with goal INR of 2.5-3.5 as well as low-dose aspirin. Follow-up oncology services for thrombocytopenia maintained on  Coumadin therapy. Peripheral smear revealed no evidence of schistocytes and recommendations of routine follow-up CBC. Bilateral lower extremity Dopplers negative. Patient with multiple bouts of loose stool 3-4 for the past few weeks. No abdominal pain or nausea vomiting. Recommendations are for outpatient GI follow-up. Orders for basic stool studies include GI pathogen panel with results pending and currently on enteric precautions. Therapy evaluations completed recommendations inpatient rehab services secondary right side weakness and decreased functional  ability.  Complete NIHSS TOTAL: 6  Patient's medical record from [facility] has been reviewed by the rehabilitation admission coordinator and physician.  Past Medical History      Past Medical History:  Diagnosis Date  . Arthritis 04/02/1996  . Depression   . GERD (gastroesophageal reflux disease) 04/03/2019  . Hyperlipidemia   . Hypertension   . Renal insufficiency   . Stroke ([place])   . Thyroid disease     Family History   family history includes Arthritis in her brother; Breast cancer in her maternal grandmother; Cancer in her mother; Kidney cancer in her mother; Stroke in her father.  Prior Rehab/Hospitalizations Has the patient had prior rehab or hospitalizations prior to admission? No  Has the patient had major surgery during 100 days prior to admission? No              Current Medications  Current Facility-Administered Medications:  .   stroke: mapping our early stages of recovery book, , Does not apply, Once, Lenore Cordia, MD .  acetaminophen (TYLENOL) tablet 650 mg, 650 mg, Oral, Q4H PRN, 650 mg at 12/29/20 0854 **OR** acetaminophen (TYLENOL) 160 MG/5ML solution 650 mg, 650 mg, Per Tube, Q4H PRN **OR** acetaminophen (TYLENOL) suppository 650 mg, 650 mg, Rectal, Q4H PRN, Zada Finders R, MD .  aspirin chewable tablet 81 mg, 81 mg, Oral, Daily, Zada Finders R, MD, 81 mg at 12/29/20 0956 .  atorvastatin (LIPITOR) tablet 80 mg, 80 mg, Oral, Daily, Lorella Nimrod, MD, 80 mg at 12/29/20 0955 .  donepezil (ARICEPT) tablet 10 mg, 10 mg, Oral, Daily, Zada Finders R, MD, 10 mg at 12/29/20 0956 .  [START ON 12/30/2020] enoxaparin (LOVENOX) injection 80 mg, 80 mg, Subcutaneous, Q12H, Amin, Sumayya, MD .  escitalopram (LEXAPRO) tablet 20 mg, 20 mg, Oral, Daily, Zada Finders R, MD, 20 mg at 12/29/20 0956 .  famotidine (PEPCID) tablet 20 mg, 20 mg, Oral, Daily, Lorella Nimrod, MD, 20 mg at 12/29/20 0956 .  ferrous sulfate tablet 325 mg, 325 mg, Oral, QHS, Amin, Soundra Pilon, MD,  325 mg at 12/28/20 2036 .  [START ON 12/30/2020] levothyroxine (SYNTHROID) tablet 100 mcg, 100 mcg, Oral, Q0600, Lorella Nimrod, MD .  loperamide (IMODIUM) capsule 2 mg, 2 mg, Oral, Q6H PRN, Lorella Nimrod, MD, 2 mg at 12/28/20 1047 .  losartan (COZAAR) tablet 25 mg, 25 mg, Oral, Daily, Lorella Nimrod, MD, 25 mg at 12/29/20 0956 .  montelukast (SINGULAIR) tablet 10 mg, 10 mg, Oral, QHS, Amin, Soundra Pilon, MD, 10 mg at 12/28/20 2036 .  senna-docusate (Senokot-S) tablet 1 tablet, 1 tablet, Oral, QHS PRN, Zada Finders R, MD .  traZODone (DESYREL) tablet 50 mg, 50 mg, Oral, QHS, Amin, Soundra Pilon, MD, 50 mg at 12/28/20 2036  Patients Current Diet:     Diet Order                  Diet Heart Room service appropriate? Yes; Fluid consistency: Thin  Diet effective now  Precautions / Restrictions Precautions Precautions: Fall Precaution Comments: decreased L visual field Restrictions Weight Bearing Restrictions: No   Has the patient had 2 or more falls or a fall with injury in the past year? No  Prior Activity Level Limited Community (1-2x/wk): fully independent, no DME used, caring for her husband who is s/p kidney transplant and not in good health.  Not driving due to L hemonymous hemianopsia from previous stroke  Prior Functional Level Self Care: Did the patient need help bathing, dressing, using the toilet or eating? Independent  Indoor Mobility: Did the patient need assistance with walking from room to room (with or without device)? Independent  Stairs: Did the patient need assistance with internal or external stairs (with or without device)? Independent  Functional Cognition: Did the patient need help planning regular tasks such as shopping or remembering to take medications? Independent  Home Assistive Devices / Equipment Home Assistive Devices/Equipment: None Home Equipment: Hospital bed (eqipment for her husband)  Prior Device Use: Indicate  devices/aids used by the patient prior to current illness, exacerbation or injury? None of the above  Current Functional Level Cognition  Overall Cognitive Status: Within Functional Limits for tasks assessed Orientation Level: Oriented X4 General Comments: Poor insight into deficits; poor problem solving related to spacial awareness limitations; has baseline visual field deficits from prior CVA    Extremity Assessment (includes Sensation/Coordination)  Upper Extremity Assessment: RUE deficits/detail RUE Deficits / Details: 4/5 grossly. Decreased [place] RUE Coordination: decreased fine motor  Lower Extremity Assessment: RLE deficits/detail,Generalized weakness RLE Deficits / Details: 0/5 ankle; 3/5 SLR, absent heel raise; PROM reveals tone in ankle extensors, ton in hip extensors 2/4 especially after 90 degrees of hip flexion RLE Sensation: WNL RLE Coordination: decreased gross motor    ADLs  Overall ADL's : Needs assistance/impaired General ADL Comments: Pt. was independent with self-feeding skills with complete set-up, requiring increased time to complete the task, and cues for strategies when using her right hand during the task, as well as cues for posiiton of the utensil in the right hand.    Mobility  Overal bed mobility: Needs Assistance Bed Mobility: Supine to Sit,Sit to Supine Supine to sit: Mod assist Sit to supine: Max assist    Transfers  Overall transfer level: Needs assistance Equipment used: Rolling walker (2 wheeled) Transfers: Sit to/from [employer] Sit to Stand: Max assist,From elevated surface Stand pivot transfers: Max assist General transfer comment:  (Therapy session involved several transfers bed to chair, chair <> BSC with MaxA transferring toward Left side. Nursing present and educated on safe transfers.)    Ambulation / Gait / Stairs / Office manager / Balance Balance Overall balance assessment: Needs  assistance Sitting-balance support: Bilateral upper extremity supported,Feet supported Sitting balance-Leahy Scale: Poor Postural control: Posterior lean,Right lateral lean Standing balance support: Bilateral upper extremity supported Standing balance-Leahy Scale: Zero    Special needs/care consideration Designated visitor [place] (from acute therapy documentation) Living Arrangements: Spouse/significant other Available Help at Discharge: Family,Available 24 hours/day,Personal care attendant,Available PRN/intermittently (Pt and PCA caregive for husbnad - unclear if he can provide much physical assistance) Home Layout: Multi-level,Able to live on main level with bedroom/bathroom Alternate Level Stairs-Number of Steps: garage is in basement full flight to main level; could enter at front door (5 steps with railings) Home Access: Stairs to enter Entrance Stairs-Number of Steps: 5 steps to enter, or full flight from garage. Home Care Services:  No  Discharge Living Setting Plans for Discharge Living Setting: Patient's home Type of Home at Discharge: House Discharge Home Layout: One level Discharge Home Access: Stairs to enter Entrance Stairs-Rails: Can reach both Entrance Stairs-Number of Steps: 4 Discharge Bathroom Shower/Tub: Tub/shower unit,Walk-in shower Discharge Bathroom Toilet: Standard Discharge Bathroom Accessibility: Yes How Accessible: Accessible via wheelchair Does the patient have any problems obtaining your medications?: No  Social/Family/Support Systems Patient Roles: Spouse,Caregiver Anticipated Caregiver: daughter, Belia Febo Anticipated Caregiver's Contact Information: [phone] Ability/Limitations of Caregiver: min assist for mobility Caregiver Availability: 24/7 Discharge Plan Discussed with Primary Caregiver: Yes Is Caregiver In Agreement with Plan?: Yes Does Caregiver/Family have Issues with  Lodging/Transportation while Pt is in Rehab?: No  Goals Patient/Family Goal for Rehab: PT/OT supervision to min assist with LRAD, SLP n/a Expected length of stay: 18-21 days Additional Information: Pt's daughter plans to move from [facility] to provide care for pt at d/c from [place] Pt/Family Agrees to Admission and willing to participate: Yes Program Orientation Provided & Reviewed with Pt/Caregiver Including Roles  & Responsibilities: Yes  Barriers to Discharge: Insurance for SNF coverage  Decrease burden of Care through IP rehab admission: n/a  Possible need for SNF placement upon discharge: Not anticipated.   Patient Condition: I have reviewed medical records from [facility], spoken with CSW, and patient and family member. I discussed via phone for inpatient rehabilitation assessment.  Patient will benefit from ongoing PT and OT, can actively participate in 3 hours of therapy a day 5 days of the week, and can make measurable gains during the admission.  Patient will also benefit from the coordinated team approach during an Inpatient Acute Rehabilitation admission.  The patient will receive intensive therapy as well as Rehabilitation physician, nursing, social worker, and care management interventions.  Due to safety, disease management, medication administration and patient education the patient requires 24 hour a day rehabilitation nursing.  The patient is currently max assist with mobility and basic ADLs.  Discharge setting and therapy post discharge at home with home health is anticipated.  Patient has agreed to participate in the Acute Inpatient Rehabilitation Program and will admit today.  Preadmission Screen Completed By:  Michel Santee, PT, DPT 12/29/2020 11:50 AM ______________________________________________________________________   Discussed status with Dr. Dagoberto Ligas on 12/29/20  at 12:00 PM  and received approval for admission today.  Admission Coordinator:  Michel Santee, PT, DPT time  12:00 PM Sudie Grumbling 12/29/20    Assessment/Plan: Diagnosis: 1. Does the need for close, 24 hr/day Medical supervision in concert with the patient's rehab needs make it unreasonable for this patient to be served in a less intensive setting? Yes 2. Co-Morbidities requiring supervision/potential complications: antiphospholipid Ab syndrome, previous stroke with visual deficits, CKD3B, new stroke with R hemi 3. Due to bladder management, bowel management, safety, disease management, medication administration, pain management and patient education, does the patient require 24 hr/day rehab nursing? Yes 4. Does the patient require coordinated care of a physician, rehab nurse, PT, OT, and SLP to address physical and functional deficits in the context of the above medical diagnosis(es)? Yes Addressing deficits in the following areas: balance, endurance, locomotion, strength, transferring, bowel/bladder control, bathing, dressing, feeding, grooming and toileting 5. Can the patient actively participate in an intensive therapy program of at least 3 hrs of therapy 5 days a week? Yes 6. The potential for patient to make measurable gains while on inpatient rehab is good 7. Anticipated functional outcomes upon discharge from inpatient rehab: supervision and min assist PT,  supervision and min assist OT, n/a SLP 8. Estimated rehab length of stay to reach the above functional goals is: 18-21 days  9. Anticipated discharge destination: Home 10. Overall Rehab/Functional Prognosis: good   MD Signature:           Revision History                        Note Details  Jan Fireman, MD File Time 12/29/2020 12:15 PM  Author Type Physician Status Signed  Last Editor Courtney Heys, MD Service Physical Medicine and Rehabilitation

## 2020-12-29 NOTE — Progress Notes (Signed)
Physical Therapy Treatment Patient Details Name: [name] MRN: [id number] DOB: 1951/06/15 Today's Date: 12/29/2020    History of Present Illness Pt. is a 89yoF who comes [facility]  on 1/24 with Rt hemi weakness Leg > Arm. Small acute infarct within the anterior right frontal lobe seen on MRI imaging. MD in room also mentions additional midline infarct on left better correlates with pt symptoms. PMH: CVA c left field deficits, DVT and antiphospholipid antibody syndrome on coumadin, CKD3b, HTN, HLD, hypoTSH, depression.    PT Comments    Patient reports she is leaving today. Agrees to participate with PT. Does not want to practice transfers to [facility] at this time, but performed sit to stand x 2 reps with max assist and right knee blocking. Cues for hand placement. Patient unable to get fully standing. Assisted patient with R LE rom exercises with increased tone noted. She will continue to benefit from skilled PT while here to improve functional independence and safety.     Follow Up Recommendations  CIR;Supervision for mobility/OOB     Equipment Recommendations  None recommended by PT;Other (comment) (TBD)    Recommendations for Other Services Rehab consult     Precautions / Restrictions Precautions Precautions: Fall Precaution Comments: decreased L visual field Restrictions Weight Bearing Restrictions: No    Mobility  Bed Mobility               General bed mobility comments: patient received in recliner and remained in recliner  Transfers Overall transfer level: Needs assistance Equipment used: None Transfers: Sit to/from Stand Sit to Stand: Max assist         General transfer comment: patient performed STS x 2 requires cues for hand placement, and blocking of R knee.  Ambulation/Gait                 Stairs             Wheelchair Mobility    Modified Rankin (Stroke Patients Only)       Balance Overall balance assessment: Needs  assistance Sitting-balance support: Feet supported Sitting balance-Leahy Scale: Poor Sitting balance - Comments: requires assistance to come forward to sit on edge of chair. Postural control: Posterior lean;Right lateral lean Standing balance support: Bilateral upper extremity supported;During functional activity Standing balance-Leahy Scale: Poor Standing balance comment: patient able to perform partial stand with max assist. Unable to get fully upright.                            Cognition Arousal/Alertness: Awake/alert Behavior During Therapy: WFL for tasks assessed/performed Overall Cognitive Status: Within Functional Limits for tasks assessed                                 General Comments: Poor insight into deficits; poor problem solving related to spacial awareness limitations; has baseline visual field deficits from prior CVA      Exercises Other Exercises Other Exercises: assisted with R LE ROM exercises. Knee ext, hip flexion,  Increased tone noted.    General Comments        Pertinent Vitals/Pain Pain Assessment: Faces Faces Pain Scale: Hurts a little bit Pain Location: L knee and left ankle where she has blister Pain Descriptors / Indicators: Sore Pain Intervention(s): Monitored during session    Home Living  Prior Function            PT Goals (current goals can now be found in the care plan section) Acute Rehab PT Goals Patient Stated Goal: To return to caregiving for her husband PT Goal Formulation: With patient Time For Goal Achievement: 01/10/21 Potential to Achieve Goals: Fair Progress towards PT goals: Progressing toward goals    Frequency    7X/week      PT Plan Current plan remains appropriate    Co-evaluation              AM-PAC PT "6 Clicks" Mobility   Outcome Measure  Help needed turning from your back to your side while in a flat bed without using bedrails?: A Lot Help  needed moving from lying on your back to sitting on the side of a flat bed without using bedrails?: A Lot Help needed moving to and from a bed to a chair (including a wheelchair)?: A Lot Help needed standing up from a chair using your arms (e.g., wheelchair or bedside chair)?: A Lot Help needed to walk in hospital room?: Total Help needed climbing 3-5 steps with a railing? : Total 6 Click Score: 10    End of Session Equipment Utilized During Treatment: Gait belt Activity Tolerance: Patient tolerated treatment well Patient left: in chair;with call bell/phone within reach;with family/visitor present Nurse Communication: Mobility status PT Visit Diagnosis: Hemiplegia and hemiparesis;Muscle weakness (generalized) (M62.81);Other abnormalities of gait and mobility (R26.89) Hemiplegia - Right/Left: Right Hemiplegia - caused by: Cerebral infarction     Time: 1145-1200 PT Time Calculation (min) (ACUTE ONLY): 15 min  Charges:  $Therapeutic Activity: 8-22 mins                     Zelda Reames, PT, GCS 12/29/20,12:39 PM

## 2020-12-29 NOTE — Progress Notes (Addendum)
ANTICOAGULATION CONSULT NOTE - Initial Consult  Pharmacy Consult for Warfarin Dosing and Monitoring  Indication: antiphospholipid antibody syndrome   Allergies  Allergen Reactions  . Penicillin G Rash   Patient Measurements: Height: 5\' 8"  (172.7 cm) Weight: 85 kg (187 lb 6.3 oz) IBW/kg (Calculated) : 63.9  Vital Signs: Temp: 98.5 F (36.9 C) (01/27 0724) BP: 142/87 (01/27 0724) Pulse Rate: 79 (01/27 0724)  Labs: Recent Labs    12/26/20 1953 12/27/20 0350 12/29/20 0543  HGB 10.6* 11.1* 11.2*  HCT 31.9* 32.6* 33.4*  PLT 86* 86* 85*  APTT 79*  --   --   LABPROT 15.1  --   --   INR 1.2  --   --   CREATININE 1.68* 1.46*  --     Estimated Creatinine Clearance: 41.5 mL/min (A) (by C-G formula based on SCr of 1.46 mg/dL (H)).   Medical History: Past Medical History:  Diagnosis Date  . Arthritis 04/02/1996  . Depression   . GERD (gastroesophageal reflux disease) 04/03/2019  . Hyperlipidemia   . Hypertension   . Renal insufficiency   . Stroke ([place])   . Thyroid disease     Assessment: 70 yo female admitted with CVA and thrombocytopenia. Patient was prescribed warfarin PTA antiphospholipid antibody syndrome. INR subtherapeutic on admission. Hematology has seen patient and discussed the importance of anticoagulation. Long-etm coumadin recommended. Per neurology ok to restart anticoagulation (warfarin w/lovenox bridge) on 1/28.    Per Hem/Onc- When on Lovenox/heparin will need to follow anti-factor Xa as patient has a baseline increased PTT secondary to APL.    Home Regimen: warfarin 2mg : Tu,Th, Sa,Su                              Warfarin 3mg : M,W,F   Goal of Therapy:  INR 2.5-3.5 Anti-Xa level 0.6-1 units/ml 4hrs after LMWH dose given Monitor platelets by anticoagulation protocol: Yes    Plan:  Enoxaparin: Enoxaparin 1mg /kg every 12 hours ordered to start 1/28 @ 1000. Will order Anti-Xa 4 hours after 4th LMWH dose.   Warfarin: Will plan to start Warfarin 4mg  x 1  dose 1/28.  INR ordered with baseline labs.   Pernell Dupre, PharmD, BCPS Clinical Pharmacist 12/29/2020 9:47 AM

## 2020-12-29 NOTE — TOC Transition Note (Signed)
Transition of Care [facility]) - CM/SW Discharge Note   Patient Details  Name: [name] MRN: [id number] Date of Birth: [birth year]  Transition of Care [facility]) CM/SW Contact:  Magnus Ivan, LCSW Phone Number: 12/29/2020, 12:56 PM   Clinical Narrative:   Patient discharging to CIR today. CIR Representative Urban Gibson has notified patient/family and will arrange transport.     Final next level of care: IP Rehab Facility Barriers to Discharge: Barriers Resolved   Patient Goals and CMS Choice Patient states their goals for this hospitalization and ongoing recovery are:: CIR   Choice offered to / list presented to : NA  Discharge Placement                Patient to be transferred to facility by: [facility] Name of family member notified: family/patient notified by CIR Representative Shann Medal    Discharge Plan and Services                                     Social Determinants of Health (SDOH) Interventions     Readmission Risk Interventions No flowsheet data found.

## 2020-12-29 NOTE — Progress Notes (Signed)
ANTICOAGULATION CONSULT NOTE - Initial Consult  Pharmacy Consult for Warfarin Dosing and Monitoring  Indication: antiphospholipid antibody syndrome   Allergies  Allergen Reactions  . Penicillin G Rash   Patient Measurements: Height: 5\' 8"  (172.7 cm) Weight: 87.9 kg (193 lb 12.6 oz) IBW/kg (Calculated) : 63.9  Vital Signs: Temp: 98.4 F (36.9 C) (01/27 1718) Temp Source: Oral (01/27 1718) BP: 135/68 (01/27 1718) Pulse Rate: 70 (01/27 1718)  Labs: Recent Labs    12/26/20 1953 12/27/20 0350 12/29/20 0543  HGB 10.6* 11.1* 11.2*  HCT 31.9* 32.6* 33.4*  PLT 86* 86* 85*  APTT 79*  --   --   LABPROT 15.1  --   --   INR 1.2  --   --   CREATININE 1.68* 1.46*  --     Estimated Creatinine Clearance: 42.2 mL/min (A) (by C-G formula based on SCr of 1.46 mg/dL (H)).   Medical History: Past Medical History:  Diagnosis Date  . Arthritis 04/02/1996  . Depression   . GERD (gastroesophageal reflux disease) 04/03/2019  . Hyperlipidemia   . Hypertension   . Renal insufficiency   . Stroke ([place])   . Thyroid disease     Assessment: 70 yo female admitted with CVA and thrombocytopenia. Patient was prescribed warfarin PTA antiphospholipid antibody syndrome. INR subtherapeutic on admission. Hematology has seen patient and discussed the importance of anticoagulation. Long-etm coumadin recommended. Per neurology ok to restart anticoagulation (warfarin w/lovenox bridge) on 1/28.    Per Hem/Onc- When on Lovenox/heparin will need to follow anti-factor Xa as patient has a baseline increased PTT secondary to APL.    Home Regimen: warfarin 2mg : Tu,Th, Sa,Su                              Warfarin 3mg : M,W,F   Goal of Therapy:  INR 2.5-3.5 Anti-Xa level 0.6-1 units/ml 4hrs after LMWH dose given Monitor platelets by anticoagulation protocol: Yes    Plan:  Enoxaparin: Enoxaparin 1mg /kg every 12 hours ordered to start 1/28 @ 1000. Will order Anti-Xa 4 hours after 4th LMWH dose.   Warfarin:  Will plan to start Warfarin 5mg  x 1 dose 1/28.  INR ordered with baseline labs.   Onnie Boer, PharmD, BCIDP, AAHIVP, CPP Infectious Disease Pharmacist 12/29/2020 5:55 PM

## 2020-12-29 NOTE — Discharge Instructions (Signed)
Please follow-up with neurology in 4 to 6 weeks, you can either go to [facility] or to a stroke specialist at [facility] or [place].

## 2020-12-29 NOTE — Progress Notes (Signed)
Pt Discharged to [facility] in-patient rehab by [facility]. IV taken out without complications. Report called to [facility] Nurse Larrie Kass. Pt took all belongings.

## 2020-12-30 DIAGNOSIS — I639 Cerebral infarction, unspecified: Secondary | ICD-10-CM | POA: Diagnosis not present

## 2020-12-30 LAB — ENA+DNA/DS+SJORGEN'S
ENA SM Ab Ser-aCnc: <0.2 AI (ref 0.0–0.9)
Ribonucleic Protein: <0.2 AI (ref 0.0–0.9)
SSA (Ro) (ENA) Antibody, IgG: <0.2 AI (ref 0.0–0.9)
SSB (La) (ENA) Antibody, IgG: <0.2 AI (ref 0.0–0.9)
ds DNA Ab: 14 IU/mL — ABNORMAL HIGH (ref 0–9)

## 2020-12-30 LAB — CBC
HCT: 33.2 % — ABNORMAL LOW (ref 36.0–46.0)
Hemoglobin: 10.7 g/dL — ABNORMAL LOW (ref 12.0–15.0)
MCH: 32.7 pg (ref 26.0–34.0)
MCHC: 32.2 g/dL (ref 30.0–36.0)
MCV: 101.5 fL — ABNORMAL HIGH (ref 80.0–100.0)
Platelets: 89 10*3/uL — ABNORMAL LOW (ref 150–400)
RBC: 3.27 MIL/uL — ABNORMAL LOW (ref 3.87–5.11)
RDW: 12.2 % (ref 11.5–15.5)
WBC: 4.5 10*3/uL (ref 4.0–10.5)
nRBC: 0 % (ref 0.0–0.2)

## 2020-12-30 LAB — H PYLORI, IGM, IGG, IGA AB
H Pylori IgG: 0.57 Index Value (ref 0.00–0.79)
H. Pylogi, Iga Abs: <9 units (ref 0.0–8.9)
H. Pylogi, Igm Abs: <9 units (ref 0.0–8.9)

## 2020-12-30 LAB — COMPREHENSIVE METABOLIC PANEL
ALT: 18 U/L (ref 0–44)
AST: 28 U/L (ref 15–41)
Albumin: 3.6 g/dL (ref 3.5–5.0)
Alkaline Phosphatase: 66 U/L (ref 38–126)
Anion gap: 11 (ref 5–15)
BUN: 32 mg/dL — ABNORMAL HIGH (ref 8–23)
CO2: 24 mmol/L (ref 22–32)
Calcium: 9.7 mg/dL (ref 8.9–10.3)
Chloride: 103 mmol/L (ref 98–111)
Creatinine, Ser: 1.77 mg/dL — ABNORMAL HIGH (ref 0.44–1.00)
GFR, Estimated: 31 mL/min — ABNORMAL LOW (ref >60)
Glucose, Bld: 129 mg/dL — ABNORMAL HIGH (ref 70–99)
Potassium: 4 mmol/L (ref 3.5–5.1)
Sodium: 138 mmol/L (ref 135–145)
Total Bilirubin: 1 mg/dL (ref 0.3–1.2)
Total Protein: 6.6 g/dL (ref 6.5–8.1)

## 2020-12-30 LAB — PROTIME-INR
INR: 1.2 (ref 0.8–1.2)
Prothrombin Time: 14.5 seconds (ref 11.4–15.2)

## 2020-12-30 LAB — FECAL FAT, QUALITATIVE
Fat Qual Neutral, Stl: NORMAL
Fat Qual Total, Stl: NORMAL

## 2020-12-30 LAB — ANA W/REFLEX: Anti Nuclear Antibody (ANA): POSITIVE — AB

## 2020-12-30 MED ORDER — VITAMIN B-12 1000 MCG PO TABS
1000.0000 ug | ORAL_TABLET | Freq: Every day | ORAL | Status: DC
  Administered 2020-12-30 – 2021-01-20 (×21): 1000 ug via ORAL
  Filled 2020-12-30 (×22): qty 1

## 2020-12-30 NOTE — Plan of Care (Signed)
Problem: RH Balance Goal: LTG: Patient will maintain dynamic sitting balance (OT) Description: LTG:  Patient will maintain dynamic sitting balance with assistance during activities of daily living (OT) Flowsheets (Taken 12/30/2020 1159) LTG: Pt will maintain dynamic sitting balance during ADLs with: Supervision/Verbal cueing Goal: LTG Patient will maintain dynamic standing with ADLs (OT) Description: LTG:  Patient will maintain dynamic standing balance with assist during activities of daily living (OT)  Flowsheets (Taken 12/30/2020 1159) LTG: Pt will maintain dynamic standing balance during ADLs with: Minimal Assistance - Patient > 75%   Problem: Sit to Stand Goal: LTG:  Patient will perform sit to stand in prep for activites of daily living with assistance level (OT) Description: LTG:  Patient will perform sit to stand in prep for activites of daily living with assistance level (OT) Flowsheets (Taken 12/30/2020 1159) LTG: PT will perform sit to stand in prep for activites of daily living with assistance level: Minimal Assistance - Patient > 75%   Problem: RH Eating Goal: LTG Patient will perform eating w/assist, cues/equip (OT) Description: LTG: Patient will perform eating with assist, with/without cues using equipment (OT) Flowsheets (Taken 12/30/2020 1159) LTG: Pt will perform eating with assistance level of: Independent with assistive device    Problem: RH Grooming Goal: LTG Patient will perform grooming w/assist,cues/equip (OT) Description: LTG: Patient will perform grooming with assist, with/without cues using equipment (OT) Flowsheets (Taken 12/30/2020 1159) LTG: Pt will perform grooming with assistance level of: Independent with assistive device    Problem: RH Bathing Goal: LTG Patient will bathe all body parts with assist levels (OT) Description: LTG: Patient will bathe all body parts with assist levels (OT) Flowsheets (Taken 12/30/2020 1159) LTG: Pt will perform bathing with  assistance level/cueing: Minimal Assistance - Patient > 75% LTG: Position pt will perform bathing: Shower   Problem: RH Dressing Goal: LTG Patient will perform upper body dressing (OT) Description: LTG Patient will perform upper body dressing with assist, with/without cues (OT). Flowsheets (Taken 12/30/2020 1159) LTG: Pt will perform upper body dressing with assistance level of: Independent with assistive device Goal: LTG Patient will perform lower body dressing w/assist (OT) Description: LTG: Patient will perform lower body dressing with assist, with/without cues in positioning using equipment (OT) Flowsheets (Taken 12/30/2020 1159) LTG: Pt will perform lower body dressing with assistance level of: Minimal Assistance - Patient > 75%   Problem: RH Toileting Goal: LTG Patient will perform toileting task (3/3 steps) with assistance level (OT) Description: LTG: Patient will perform toileting task (3/3 steps) with assistance level (OT)  Flowsheets (Taken 12/30/2020 1159) LTG: Pt will perform toileting task (3/3 steps) with assistance level: Minimal Assistance - Patient > 75%   Problem: RH Functional Use of Upper Extremity Goal: LTG Patient will use RT/LT upper extremity as a (OT) Description: LTG: Patient will use right/left upper extremity as a stabilizer/gross assist/diminished/nondominant/dominant level with assist, with/without cues during functional activity (OT) Flowsheets (Taken 12/30/2020 1159) LTG: Use of upper extremity in functional activities: RUE as dominant level LTG: Pt will use upper extremity in functional activity with assistance level of: Independent with assistive device   Problem: RH Toilet Transfers Goal: LTG Patient will perform toilet transfers w/assist (OT) Description: LTG: Patient will perform toilet transfers with assist, with/without cues using equipment (OT) Flowsheets (Taken 12/30/2020 1159) LTG: Pt will perform toilet transfers with assistance level of: Minimal  Assistance - Patient > 75%   Problem: RH Tub/Shower Transfers Goal: LTG Patient will perform tub/shower transfers w/assist (OT) Description: LTG: Patient will  perform tub/shower transfers with assist, with/without cues using equipment (OT) Flowsheets (Taken 12/30/2020 1159) LTG: Pt will perform tub/shower stall transfers with assistance level of: Minimal Assistance - Patient > 75%

## 2020-12-30 NOTE — Evaluation (Signed)
Physical Therapy Assessment and Plan  Patient Details  Name: [name] MRN: [id number] Date of Birth: February 27, 1951  PT Diagnosis: Abnormal posture, Abnormality of gait, Ataxia, Ataxic gait, Coordination disorder, Hemiplegia dominant, Impaired sensation and Muscle weakness Rehab Potential: Good ELOS: 3.5-4 weeks   Today's Date: 12/30/2020 PT Individual Time: 0093-8182 PT Individual Time Calculation (min): 85 min    Hospital Problem: Active Problems:   Ischemic cerebrovascular accident (CVA) of frontal lobe [facility])   Past Medical History:  Past Medical History:  Diagnosis Date  . Arthritis 04/02/1996  . Depression   . GERD (gastroesophageal reflux disease) 04/03/2019  . Hyperlipidemia   . Hypertension   . Renal insufficiency   . Stroke ([place])   . Thyroid disease    Past Surgical History:  Past Surgical History:  Procedure Laterality Date  . CESAREAN SECTION    . CHOLECYSTECTOMY    . COLONOSCOPY  2012   repeat in 10 yrs  . KNEE ARTHROSCOPY W/ MENISCAL REPAIR Left     Assessment & Plan Clinical Impression: Patient is a 70 year old right-handed female with history of CVA 2004 with residual left lateral visual field deficits, history of DVT and antiphospholipid antibody syndrome diagnosed 2004 followed by Dr. Otilio Miu maintained on chronic Coumadin, CKD stage III, hypertension, hyperlipidemia, hypothyroidism and mild cognitive impairments maintained on Aricept. Per chart review patient lives with spouse. Multilevel home bed and bath main level 5 steps to entry. Husband is limited physically to assist as personal care attendance and currently is admitted to [facility] as a renal transplant. They do have a daughter here visiting from [place] at present. Patient reportedly independent prior to admission. Presented 12/26/2020 with acute onset of right leg weakness. Cranial CT scan showed no acute findings. Multiple old infarcts in the right occipital lobe at the  right parietal vertex and at the left frontal convexity as well as chronic small vessel ischemic changes in the white matter. Patient did not receive TPA. MRI showed small acute infarct within the anterior right frontal lobe. No hemorrhage or mass-effect. Distal occlusion of the right PCA likely chronic given old PCA territory infarction. Moderate stenosis of the left P4 segment. Patient transferred to CIR on 12/29/2020 .   Patient currently requires max with mobility secondary to muscle weakness, muscle joint tightness and muscle paralysis, decreased cardiorespiratoy endurance, impaired timing and sequencing, abnormal tone, unbalanced muscle activation, motor apraxia, ataxia, decreased coordination and decreased motor planning, decreased visual acuity, decreased visual perceptual skills, field cut and hemianopsia, decreased attention to right, decreased attention, decreased awareness, decreased problem solving, decreased safety awareness, decreased memory and delayed processing and decreased sitting balance, decreased standing balance, decreased postural control, hemiplegia and decreased balance strategies.  Prior to hospitalization, patient was independent  with mobility and lived with Spouse in a House home.  Home access is 5 steps to enter, or full flight from garage.Stairs to enter.  Patient will benefit from skilled PT intervention to maximize safe functional mobility, minimize fall risk and decrease caregiver burden for planned discharge home with 24 hour assist.  Anticipate patient will benefit from follow up [facility] at discharge.  PT - End of Session Activity Tolerance: Tolerates 10 - 20 min activity with multiple rests Endurance Deficit: Yes PT Assessment Rehab Potential (ACUTE/IP ONLY): Good PT Barriers to Discharge: Brushy home environment;Decreased caregiver support;Home environment access/layout;Lack of/limited family support;Insurance for SNF coverage;Behavior PT Patient demonstrates  impairments in the following area(s): Balance;Behavior;Edema;Endurance;Motor;Nutrition;Pain;Perception;Safety;Sensory;Skin Integrity PT Transfers Functional Problem(s): Bed Mobility;Bed to  Chair;Car;Furniture;Floor PT Locomotion Functional Problem(s): Ambulation;Wheelchair Mobility;Stairs PT Plan PT Intensity: Minimum of 1-2 x/day ,45 to 90 minutes PT Frequency: 5 out of 7 days PT Duration Estimated Length of Stay: 3.5-4 weeks PT Treatment/Interventions: Ambulation/gait training;Balance/vestibular training;Cognitive remediation/compensation;Community reintegration;Discharge planning;Disease management/prevention;DME/adaptive equipment instruction;Functional electrical stimulation;Functional mobility training;Patient/family education;Pain management;Neuromuscular re-education;Psychosocial support;Skin care/wound management;Therapeutic Activities;Stair training;Splinting/orthotics;Therapeutic Exercise;UE/LE Strength taining/ROM;UE/LE Coordination activities;Visual/perceptual remediation/compensation;Wheelchair propulsion/positioning PT Transfers Anticipated Outcome(s): CGA stand pivot transfers PT Locomotion Anticipated Outcome(s): Min assist ambulatory PT Recommendation Recommendations for Other Services: Therapeutic Recreation consult;Neuropsych consult Therapeutic Recreation Interventions: Stress management;Outing/community reintergration Follow Up Recommendations: Home health PT Patient destination: Home Equipment Recommended: Wheelchair cushion (measurements);Wheelchair (measurements);To be determined   PT Evaluation Precautions/Restrictions Precautions Precautions: Fall Precaution Comments: decreased L visual field Restrictions Weight Bearing Restrictions: No Pain Pain Assessment Pain Scale: 0-10 Pain Score: 3  Faces Pain Scale: Hurts a little bit Pain Type: Acute pain Pain Location: Foot Pain Orientation: Left Pain Descriptors / Indicators: Aching Pain Intervention(s):  Repositioned Home Living/Prior Functioning Home Living Living Arrangements: Spouse/significant other (Patient and PCA cargiver providing care for spouse) Available Help at Discharge: Family;Personal care attendant (primary CG for husband, husband has PCA present PRN, daughter plans to move into home upon DC but works from home - will hire PCA as needed) Type of Home: House Home Access: Stairs to enter [employer] of Steps: 5 steps to enter, or full flight from garage. 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 Comments: Caregiver of her husband Vision/Perception  Perception Perception: Impaired Body Scheme: reports feeling like she is leaning to the L when she is leaning to the R, impaired propioception during functional transfers Spatial Orientation: reports feeling like she is leaning to the L when she is leaning to the R, impaired propioception during functional transfers Praxis Praxis: Intact  Cognition Overall Cognitive Status: Within Functional Limits for tasks assessed Arousal/Alertness: Awake/alert Attention: Sustained Sustained Attention: Appears intact Memory: Appears intact Immediate Memory Recall: Bed;Blue;Sock Memory Recall Sock: Not able to recall Memory Recall Blue: Without Cue Memory Recall Bed: Without Cue Awareness: Appears  intact Problem Solving: Impaired Problem Solving Impairment: Functional complex Safety/Judgment: Appears intact Sensation Sensation Light Touch: Appears Intact Hot/Cold: Appears Intact Proprioception: Impaired by gross assessment Stereognosis: Appears Intact Coordination Gross Motor Movements are Fluid and Coordinated: No Fine Motor Movements are Fluid and Coordinated: No Coordination and Movement Description: Slowed R digit - thumb opposition; R hemiplegia RLE>RUE Motor  Motor Motor: Hemiplegia;Abnormal tone;Abnormal postural alignment and control Motor - Skilled Clinical Observations: R hemiplegia RLE>RUE   Trunk/Postural Assessment  Cervical Assessment Cervical Assessment: Within Functional Limits Thoracic Assessment Thoracic Assessment: Exceptions to [facility] (rounded shoulders) Lumbar Assessment Lumbar Assessment: Exceptions to [facility] (posterior pelvic tilt) Postural Control Postural Control: Deficits on evaluation (moderate R/posterior lean sitting EOB requiring mod to max to correct)  Balance Balance Balance Assessed: Yes Static Sitting Balance Static Sitting - Balance Support: Feet supported Static Sitting - Level of Assistance: 3: Mod assist (intermittant mod A to maintain static sitting balance) Dynamic Sitting  Balance Dynamic Sitting - Balance Support: Feet supported Dynamic Sitting - Level of Assistance: 3: Mod assist (intermittant mod A to maintain static sitting balance) Static Standing Balance Static Standing - Balance Support: Bilateral upper extremity supported Static Standing - Level of Assistance: 2: Max assist (intermittant max A to maintain upright posture in stedy) Extremity Assessment  RUE Assessment RUE Assessment: Exceptions to [facility] Active Range of Motion (AROM) Comments: 3/4 to full shoulder flexion General Strength Comments: 3/5 in shoulder flexion, 4/5 grip strength RUE Body System: Neuro Brunstrum levels for arm and hand: Arm;Hand Brunstrum level for  arm: Stage IV Movement is deviating from synergy Brunstrum level for hand: Stage V Independence from basic synergies LUE Assessment LUE Assessment: Within Functional Limits Active Range of Motion (AROM) Comments: 3/4 to full shoulder flexion General Strength Comments: 5/5 in shoulder flexion, 5/5 grip strength RLE Assessment RLE Assessment: Exceptions to [facility] General Strength Comments: grossly 2/5 with flexion/extension in synergies. functionally at least 3+/5 in extension synergy as pt able to WB on RLE without buckling LLE Assessment LLE Assessment: Within Functional Limits General Strength Comments: grossly 5/5  Care Tool Care Tool Bed Mobility Roll left and right activity   Roll left and right assist level: Moderate Assistance - Patient 50 - 74%    Sit to lying activity   Sit to lying assist level: Moderate Assistance - Patient 50 - 74%    Lying to sitting edge of bed activity   Lying to sitting edge of bed assist level: Moderate Assistance - Patient 50 - 74%     Care Tool Transfers Sit to stand transfer   Sit to stand assist level: Maximal Assistance - Patient 25 - 49%    Chair/bed transfer   Chair/bed transfer assist level: Maximal Assistance - Patient 25 - 49%     Toilet transfer   Assist Level: Dependent - Patient 0%    Scientist, product/process development transfer activity did not occur: Safety/medical concerns        Care Tool Locomotion Ambulation Ambulation activity did not occur: Safety/medical concerns        Walk 10 feet activity Walk 10 feet activity did not occur: Safety/medical concerns       Walk 50 feet with 2 turns activity Walk 50 feet with 2 turns activity did not occur: Safety/medical concerns      Walk 150 feet activity Walk 150 feet activity did not occur: Safety/medical concerns      Walk 10 feet on uneven surfaces activity Walk 10 feet on uneven surfaces activity did not occur: Safety/medical concerns      Stairs Stair activity did not occur:  Safety/medical concerns        Walk up/down 1 step activity Walk up/down 1 step or curb (drop down) activity did not occur: Safety/medical concerns     Walk up/down 4 steps activity did not occuR: Safety/medical concerns  Walk up/down 4 steps activity      Walk up/down 12 steps activity Walk up/down 12 steps activity did not occur: Safety/medical concerns      Pick up small objects from floor Pick up small object from the floor (from standing position) activity did not occur: Safety/medical concerns      Wheelchair       Wheelchair assist level: Moderate Assistance - Patient 50 - 74% Max wheelchair distance: 50  Wheel 50 feet with 2 turns activity   Assist Level: Moderate Assistance - Patient 50 - 74%  Wheel 150 feet activity  Assist Level: Total Assistance - Patient < 25%    Refer to Care Plan for Long Term Goals  SHORT TERM GOAL WEEK 1 PT Short Term Goal 1 (Week 1): pt will transfer to [facility] with mod assist consistently PT Short Term Goal 2 (Week 1): Pt will perform bed mobility with min assist PT Short Term Goal 3 (Week 1): Pt will ambulate 6f with mod assist and BUE support on RW and +2 for safety PT Short Term Goal 4 (Week 1): Pt will propell WC [id number] supervision assist  Recommendations for other services: Neuropsych and Therapeutic Recreation  Stress management and Outing/community reintegration  Skilled Therapeutic Intervention  Pt received supine in bed and agreeable to PT. Supine>sit transfer with mod assist and cues for safety.   PT instructed patient in PT Evaluation and initiated treatment intervention; see above for results. PT educated [place] in [place] [place], rehab goals, and discharge recommendations along with recommendation for follow-up rehabilitation services.   Function In Sitting Test (FIST)  (1/2 femur on surface; hips/knees flexed to 90deg)   - indicate bed or mat table / step stool if used  SCORING KEY: 4 = Independent (completes  task independently & successfully) 3 = Verbal Cues/Increased Time (completes task independently & successfully and only needs more time/cues) 2 = Upper Extremity Support (must use UE for support or assistance to complete successfully) 1 = Needs Assistance (unable to complete w/o physical assist; DOCUMENT LEVEL: min, mod, max) 0 = Dependent (requires complete physical assist; unable to complete successfully even w/ physical assist)  Randomly Administer Once Throughout Exam  2 - Anterior Nudge (superior sternum)  3 - Posterior Nudge (between scapular spines)  4 - Lateral Nudge (to dominant side at acromion)     4 - Static sitting (30 seconds)  3 - Sitting, shake 'no' (left and right)  3 - Sitting, eyes closed (30 seconds)   2 - Sitting, lift foot (dominant side, lift foot 1 inch twice)    2 - Pick up object from behind (object at midline, hands breadth posterior)  3 - Forward reach (use dominant arm, must complete full motion) 2 - Lateral reach (use dominant arm, clear opposite ischial tuberosity) 2- Pick up object from floor (from between feet)   1 - Posterior scooting (move backwards 2 inches)  2 - Anterior scooting (move forward 2 inches)  2 - Lateral scooting (move to dominant side 2 inches)    TOTAL = 35/56  Notes/comments: performed on mat table. intermittent UE support on Edge of mat.     Car transfer with mod-max assist using SB for safety and max cues for motor planning throughout transfer as  Well as awareness of the RLE.   Pt returned to room and performed SB transfer to bed with mod assist and max cues for sequencing. Sit>supine completed with mod assist. Rolling R and L to place bed pan with mod assist to the L. Pt able to void. Clothing management performed by PT, Daughter performed peri care per patient request. Supine>sit with mod assist. SB [facility].  [facility]  [facility] in reach and all needs met.       Mobility Bed Mobility Bed Mobility: Rolling Right;Right Sidelying to Sit;Sit to Sidelying Right;Rolling Left Rolling Right: Moderate Assistance - Patient 50-74% Rolling Left: Moderate Assistance - Patient 50-74% Right Sidelying to Sit: Moderate Assistance - Patient 50-74% Sit to Supine: Moderate Assistance -  Patient 50-74% Sit to Sidelying Right: Moderate Assistance - Patient 50-74% Transfers Transfers: Squat Pivot Transfers;Transfer via Lift Equipment;Stand to Sit;Sit to Stand Sit to Stand: Dependent - mechanical lift (stedy) Stand to Sit: Dependent - mechanical lift (stedy) Squat Pivot Transfers: 2 Helpers Transfer via Lift Equipment: Animal nutritionist: Yes Gait Assistance: 2 Holiday representative (Feet): 30 Feet Assistive device: Other (Comment) (rail in hall) Gait Assistance Details: Verbal cues for safe use of DME/AE;Verbal cues for gait pattern;Manual facilitation for weight shifting;Manual facilitation for placement;Manual facilitation for weight bearing;Verbal cues for precautions/safety;Verbal cues for technique;Verbal cues for sequencing Gait Gait: Yes Gait Pattern: Impaired Gait Pattern: Right steppage;Right foot flat;Right genu recurvatum Stairs / Additional Locomotion Stairs: No Wheelchair Mobility Wheelchair Mobility: Yes Wheelchair Assistance: Minimal assistance - Patient >75% Wheelchair Propulsion: Both upper extremities Wheelchair Parts Management: Needs assistance Distance: 50   Discharge Criteria: Patient will be discharged from PT if patient refuses treatment 3 consecutive times without medical reason, if treatment goals not met, if there is a change in medical status, if patient makes no progress towards goals or if patient is discharged from hospital.  The above assessment, treatment plan, treatment alternatives and goals were discussed and mutually agreed upon: by patient and by family  Lorie Phenix 12/30/2020, 11:21 AM

## 2020-12-30 NOTE — Progress Notes (Signed)
Inpatient Rehabilitation Center Individual Statement of Services  Patient Name:  [name] [facility]  Date:  12/30/2020  Welcome to the [facility].  Our goal is to provide you with an individualized program based on your diagnosis and situation, designed to meet your specific needs.  With this comprehensive rehabilitation program, you will be expected to participate in at least 3 hours of rehabilitation therapies Monday-Friday, with modified therapy programming on the weekends.  Your rehabilitation program will include the following services:  Physical Therapy (PT), Occupational Therapy (OT), Speech Therapy (ST), 24 hour per day rehabilitation nursing, Therapeutic Recreaction (TR), Neuropsychology, Care Coordinator, Rehabilitation Medicine, Nutrition Services, Pharmacy Services and Other  Weekly team conferences will be held on Wednesdays to discuss your progress.  Your Inpatient Rehabilitation Care Coordinator will talk with you frequently to get your input and to update you on team discussions.  Team conferences with you and your family in attendance may also be held.  Expected length of stay: 18-21 Days  Overall anticipated outcome: Supervision to Min A  Depending on your progress and recovery, your program may change. Your Inpatient Rehabilitation Care Coordinator will coordinate services and will keep you informed of any changes. Your Inpatient Rehabilitation Care Coordinator's name and contact numbers are listed  below.  The following services may also be recommended but are not provided by the [facility]:    [place] will be made to provide these services after discharge if needed.  Arrangements include referral to agencies that provide these services.  Your insurance has been verified to be:  [facility] Medicare Your primary doctor is:  Otilio Miu, MD  Pertinent  information will be shared with your doctor and your insurance company.  Inpatient Rehabilitation Care Coordinator:  [name], [place] or [phone]  Information discussed with and copy given to patient by: [name], 12/30/2020, 9:53 AM

## 2020-12-30 NOTE — Plan of Care (Signed)
  Problem: RH Balance Goal: LTG Patient will maintain dynamic sitting balance (PT) Description: LTG:  Patient will maintain dynamic sitting balance with assistance during mobility activities (PT) Flowsheets (Taken 12/30/2020 1542) LTG: Pt will maintain dynamic sitting balance during mobility activities with:: Independent with assistive device  Goal: LTG Patient will maintain dynamic standing balance (PT) Description: LTG:  Patient will maintain dynamic standing balance with assistance during mobility activities (PT) Flowsheets (Taken 12/30/2020 1542) LTG: Pt will maintain dynamic standing balance during mobility activities with:: Minimal Assistance - Patient > 75%   Problem: Sit to Stand Goal: LTG:  Patient will perform sit to stand with assistance level (PT) Description: LTG:  Patient will perform sit to stand with assistance level (PT) Flowsheets (Taken 12/30/2020 1542) LTG: PT will perform sit to stand in preparation for functional mobility with assistance level: Supervision/Verbal cueing   Problem: RH Bed Mobility Goal: LTG Patient will perform bed mobility with assist (PT) Description: LTG: Patient will perform bed mobility with assistance, with/without cues (PT). Flowsheets (Taken 12/30/2020 1542) LTG: Pt will perform bed mobility with assistance level of: Supervision/Verbal cueing   Problem: RH Bed to Chair Transfers Goal: LTG Patient will perform bed/chair transfers w/assist (PT) Description: LTG: Patient will perform bed to chair transfers with assistance (PT). Flowsheets (Taken 12/30/2020 1542) LTG: Pt will perform Bed to Chair Transfers with assistance level: Contact Guard/Touching assist   Problem: RH Car Transfers Goal: LTG Patient will perform car transfers with assist (PT) Description: LTG: Patient will perform car transfers with assistance (PT). Flowsheets (Taken 12/30/2020 1542) LTG: Pt will perform car transfers with assist:: Minimal Assistance - Patient > 75%   Problem:  RH Ambulation Goal: LTG Patient will ambulate in controlled environment (PT) Description: LTG: Patient will ambulate in a controlled environment, # of feet with assistance (PT). Flowsheets (Taken 12/30/2020 1542) LTG: Pt will ambulate in controlled environ  assist needed:: Minimal Assistance - Patient > 75% LTG: Ambulation distance in controlled environment: 148ft with LRAD Goal: LTG Patient will ambulate in home environment (PT) Description: LTG: Patient will ambulate in home environment, # of feet with assistance (PT). Flowsheets (Taken 12/30/2020 1542) LTG: Pt will ambulate in home environ  assist needed:: Minimal Assistance - Patient > 75% LTG: Ambulation distance in home environment: 52ft with LRAD   Problem: RH Wheelchair Mobility Goal: LTG Patient will propel w/c in controlled environment (PT) Description: LTG: Patient will propel wheelchair in controlled environment, # of feet with assist (PT) Flowsheets (Taken 12/30/2020 1542) LTG: Pt will propel w/c in controlled environ  assist needed:: Supervision/Verbal cueing LTG: Propel w/c distance in controlled environment: 128ft   Problem: RH Stairs Goal: LTG Patient will ambulate up and down stairs w/assist (PT) Description: LTG: Patient will ambulate up and down # of stairs with assistance (PT) Flowsheets (Taken 12/30/2020 1542) LTG: Pt will ambulate up/down stairs assist needed:: Supervision/Verbal cueing LTG: Pt will  ambulate up and down number of stairs: 50

## 2020-12-30 NOTE — Progress Notes (Signed)
Inpatient Rehabilitation  Patient information reviewed and entered into eRehab system by Rishab Stoudt M. Jaxzen Vanhorn, M.A., CCC/SLP, PPS Coordinator.  Information including medical coding, functional ability and quality indicators will be reviewed and updated through discharge.    

## 2020-12-30 NOTE — Progress Notes (Signed)
Inpatient Rehabilitation Care Coordinator Assessment and Plan Patient Details  Name: [name] MRN: [id number] Date of Birth: [birth year]  Today's Date: 12/30/2020  Hospital Problems: Active Problems:   Ischemic cerebrovascular accident (CVA) of frontal lobe [facility])  Past Medical History:  Past Medical History:  Diagnosis Date  . Arthritis 04/02/1996  . Depression   . GERD (gastroesophageal reflux disease) 04/03/2019  . Hyperlipidemia   . Hypertension   . Renal insufficiency   . Stroke ([place])   . Thyroid disease    Past Surgical History:  Past Surgical History:  Procedure Laterality Date  . CESAREAN SECTION    . CHOLECYSTECTOMY    . COLONOSCOPY  2012   repeat in 10 yrs  . KNEE ARTHROSCOPY W/ MENISCAL REPAIR Left    Social History:  reports that she has never smoked. She has never used smokeless tobacco. She reports current alcohol use of about 1.0 standard drink of alcohol per week. She reports that she does not use drugs.  Family / Support Systems Marital Status: Married Patient Roles: Spouse Spouse/Significant Other: Edward Children: Hiliary Other Supports: Electrical engineer (Sister) Anticipated Caregiver: Hillary Ability/Limitations of Caregiver: Daughter currently caring for both parents (pt spouse in hospital at [facility], requiring 24/7 caregivers (unable to be placed at [facility]) Caregiver Availability: Other (Comment)  Social History Preferred language: English Religion: Catholic Read: Yes Write: Yes Guardian/Conservator: Engineer, site   Abuse/Neglect Abuse/Neglect Assessment Can Be Completed: Yes Physical Abuse: Denies Verbal Abuse: Denies Sexual Abuse: Denies Exploitation of patient/patient's resources: Denies Self-Neglect: Denies  Emotional Status Pt's affect, behavior and adjustment status: no Recent Psychosocial Issues: no Psychiatric History: no Substance Abuse History: no  Patient / Family Perceptions, Expectations & Goals Pt/Family understanding of illness &  functional limitations: yes Premorbid pt/family roles/activities: PReviously independent and caring for her spouse (Kidney transplant-health not good) Anticipated changes in roles/activities/participation: Will require assistance Pt/family expectations/goals: Supervision to [place]: None Premorbid Home Care/DME Agencies: None Transportation available at discharge: family able to [employer] referrals recommended: Neuropsychology  Discharge Planning Living Arrangements: Spouse/significant other (Patient and PCA cargiver providing care for spouse) Support Systems: Children Type of Residence: Private residence (Multi-Level (B&B on 1st level) Full flight to enter garage/5 steps to enter front door (with railings) OR 1 level home, 4 steps to enter) Administrator, sports: Multimedia programmer (specify) ([place]) Museum/gallery curator Resources: [place] Referred: No Living Expenses: Own Money Management: Patient Does the patient have any problems obtaining your medications?: No Home Management: Indpendent Care Coordinator Barriers to Discharge: Lack of/limited family support,Decreased caregiver support,Insurance for SNF coverage,Home environment access/layout,Inaccessible home environment Care Coordinator Barriers to Discharge Comments: Pt spouse in hospital a [facility] daughter potentially will be unable to care for BOTH parents Care Coordinator Anticipated Follow Up Needs: HH/OP DC Planning Additional Notes/Comments: PER AC DTR PLANS TO MOVE FORM [place] PATIENT AT D/C FROM CIR Expected length of stay: 18-21 Days  Clinical Impression Sw met patient/daughter (via telephone), introduced self, explained role and addressed questions and concerns. Daughter became very emotional on phone. Dealing with mom here at [facility] and pt spouse at [facility] needing SNF placement-but unable to obtain. Patient was previously was primary  caregiver for spouse. Daughter has moved from [facility] to [facility] , but unsure how she will take care of both parents if requiring assistance and 24/7. SW will continue to provide updates to patient daughter and follow up with additional questions and concerns.   [name] 12/30/2020,  9:48 AM

## 2020-12-30 NOTE — Evaluation (Signed)
Occupational Therapy Assessment and Plan  Patient Details  Name: [name] MRN: [id number] Date of Birth: [birth year]  OT Diagnosis: abnormal posture, cognitive deficits, disturbance of vision, flaccid hemiplegia and hemiparesis, hemiplegia affecting dominant side, muscle weakness (generalized) and impaired safety awareness Rehab Potential: Rehab Potential (ACUTE ONLY): Good ELOS: 23-25 days   Today's Date: 12/30/2020 OT Individual Time: 6761-9509 OT Individual Time Calculation (min): 75 min     Hospital Problem: Active Problems:   Ischemic cerebrovascular accident (CVA) of frontal lobe [facility])   Past Medical History:  Past Medical History:  Diagnosis Date  . Arthritis 04/02/1996  . Depression   . GERD (gastroesophageal reflux disease) 04/03/2019  . Hyperlipidemia   . Hypertension   . Renal insufficiency   . Stroke ([address])   . Thyroid disease    Past Surgical History:  Past Surgical History:  Procedure Laterality Date  . CESAREAN SECTION    . CHOLECYSTECTOMY    . COLONOSCOPY  2012   repeat in 10 yrs  . KNEE ARTHROSCOPY W/ MENISCAL REPAIR Left     Assessment & Plan Clinical Impression: Patient is a 70 y.o. year old female with history of CVA 2004 with residual left lateral visual field deficits, history of DVT and antiphospholipid antibody syndrome diagnosed 2004 followed by Dr. Otilio Miu maintained on chronic Coumadin, CKD stage III, hypertension, hyperlipidemia, hypothyroidism and mild cognitive impairments maintained on Aricept. Per chart review patient lives with spouse. Multilevel home bed and bath main level 5 steps to entry. Husband is limited physically to assist as personal care attendance and currently is admitted to [facility] as a renal transplant. They do have a daughter here visiting from [place] at present. Patient reportedly independent prior to admission. Presented 12/26/2020 with acute onset of right leg weakness. Cranial CT scan showed no  acute findings. Multiple old infarcts in the right occipital lobe at the right parietal vertex and at the left frontal convexity as well as chronic small vessel ischemic changes in the white matter. Patient did not receive TPA. MRI showed small acute infarct within the anterior right frontal lobe. No hemorrhage or mass-effect. Distal occlusion of the right PCA likely chronic given old PCA territory infarction. Moderate stenosis of the left P4 segment. Admission chemistries unremarkable except BUN 30 creatinine 1.68 glucose 102, urine drug screen negative, hemoglobin 11.1, platelets 86,000, INR 1.2.. Echocardiogram with ejection fraction of 55 to 60% no wall motion abnormalities. Grade 1 diastolic dysfunction. Neurology follow-up patient currently remains on chronic Coumadin therapy with Lovenox till INR therapeutic with goal INR of 2.5-3.5 as well as low-dose aspirin. Follow-up oncology services for thrombocytopenia maintained on Coumadin therapy. Peripheral smear revealed no evidence of schistocytes and recommendations of routine follow-up CBC. Bilateral lower extremity Dopplers negative. Patient with multiple bouts of loose stool 3-4 for the past few weeks. No abdominal pain or nausea vomiting. Recommendations are for outpatient GI follow-up. Orders for basic stool studies include GI pathogen panel with results pending and currently on enteric precautions. Therapy evaluations completed recommendations inpatient rehab services secondary right side weakness and decreased functional ability as well as cognitive deficits. [name] a 70 year old right-handed female with history of CVA 2004 with residual left lateral visual field deficits, history of DVT and antiphospholipid antibody syndrome diagnosed 2004 followed by Dr. Otilio Miu maintained on chronic Coumadin, CKD stage III, hypertension, hyperlipidemia, hypothyroidism and mild cognitive impairments maintained on Aricept. Per chart  review patient lives with spouse. Multilevel home bed and bath main  level 5 steps to entry. Husband is limited physically to assist as personal care attendance and currently is admitted to [facility] as a renal transplant. They do have a daughter here visiting from [place] at present. Patient reportedly independent prior to admission. Presented 12/26/2020 with acute onset of right leg weakness. Cranial CT scan showed no acute findings. Multiple old infarcts in the right occipital lobe at the right parietal vertex and at the left frontal convexity as well as chronic small vessel ischemic changes in the white matter. Patient did not receive TPA. MRI showed small acute infarct within the anterior right frontal lobe. No hemorrhage or mass-effect. Distal occlusion of the right PCA likely chronic given old PCA territory infarction. Moderate stenosis of the left P4 segment. Admission chemistries unremarkable except BUN 30 creatinine 1.68 glucose 102, urine drug screen negative, hemoglobin 11.1, platelets 86,000, INR 1.2.. Echocardiogram with ejection fraction of 55 to 60% no wall motion abnormalities. Grade 1 diastolic dysfunction. Neurology follow-up patient currently remains on chronic Coumadin therapy with Lovenox till INR therapeutic with goal INR of 2.5-3.5 as well as low-dose aspirin. Follow-up oncology services for thrombocytopenia maintained on Coumadin therapy. Peripheral smear revealed no evidence of schistocytes and recommendations of routine follow-up CBC. Bilateral lower extremity Dopplers negative. Patient with multiple bouts of loose stool 3-4 for the past few weeks. No abdominal pain or nausea vomiting. Recommendations are for outpatient GI follow-up. Orders for basic stool studies include GI pathogen panel with results pending and currently on enteric precautions. Therapy evaluations completed recommendations inpatient rehab services secondary right side weakness and decreased  functional ability as well as cognitive deficits.  Patient transferred to CIR on 12/29/2020 .    Patient currently requires mod to dependent with basic self-care skills secondary to muscle weakness, decreased cardiorespiratoy endurance, impaired timing and sequencing, abnormal tone, unbalanced muscle activation, motor apraxia, decreased coordination and decreased motor planning, hemianopsia and decreased midline orientation and decreased motor planning.  Prior to hospitalization, patient could complete ADL/IADL with independent .  Patient will benefit from skilled intervention to decrease level of assist with basic self-care skills and increase independence with basic self-care skills prior to discharge home with care partner.  Anticipate patient will require intermittent supervision and minimal physical assistance and follow up home health.  OT - End of Session Activity Tolerance: Tolerates 10 - 20 min activity with multiple rests Endurance Deficit: Yes Endurance Deficit Description: Req increased time/freq rest breaks throughout ADL OT Assessment Rehab Potential (ACUTE ONLY): Good OT Barriers to Discharge: Decreased caregiver support;Inaccessible home environment OT Barriers to Discharge Comments: Steps to enter home, primary CG for husband OT Patient demonstrates impairments in the following area(s): Balance;Skin Integrity;Vision;Endurance;Motor;Perception OT Basic ADL's Functional Problem(s): Eating;Grooming;Bathing;Dressing;Toileting OT Transfers Functional Problem(s): Toilet;Tub/Shower OT Additional Impairment(s): Fuctional Use of Upper Extremity OT Plan OT Intensity: Minimum of 1-2 x/day, 45 to 90 minutes OT Frequency: 5 out of 7 days OT Duration/Estimated Length of Stay: 23-25 days OT Treatment/Interventions: Balance/vestibular training;DME/adaptive equipment instruction;Disease mangement/prevention;Neuromuscular re-education;Self Care/advanced ADL retraining;Therapeutic  Exercise;Wheelchair propulsion/positioning;UE/LE Strength taining/ROM;Skin care/wound managment;Pain management;Community reintegration;Functional electrical stimulation;Patient/family education;Splinting/orthotics;UE/LE Coordination activities;Visual/perceptual remediation/compensation;Therapeutic Activities;Psychosocial support;Functional mobility training;Discharge planning;Cognitive remediation/compensation OT Self Feeding Anticipated Outcome(s): mod I OT Basic Self-Care Anticipated Outcome(s): min A OT Toileting Anticipated Outcome(s): min A OT Bathroom Transfers Anticipated Outcome(s): min A OT Recommendation Recommendations for Other Services: Therapeutic Recreation consult Therapeutic Recreation Interventions: Pet therapy;Kitchen group;Stress management;Outing/community reintergration Patient destination: Home Follow Up Recommendations: Home health OT Equipment Recommended: 3 in 1 bedside comode;Tub/shower bench  OT Evaluation Precautions/Restrictions  Precautions Precautions: Fall Precaution Comments: decreased L visual field Restrictions Weight Bearing Restrictions: No General Chart Reviewed: Yes Pain Pain Assessment Pain Scale: 0-10 Pain Score: 3  Faces Pain Scale: Hurts a little bit Pain Type: Acute pain Pain Location: Foot Pain Orientation: Left Pain Descriptors / Indicators: Aching Pain Intervention(s): Repositioned Home Living/Prior Functioning Home Living Family/patient expects to be discharged to:: Private residence Living Arrangements: Spouse/significant other (Patient and PCA cargiver providing care for spouse) Available Help at Discharge: Family,Personal care attendant (primary CG for husband, husband has PCA present PRN, daughter plans to move into home upon DC but works from home - will hire PCA as needed) Type of Home: House Home Access: Stairs to enter [employer] of Steps: 5 steps to enter, or full flight from garage. Entrance Stairs-Rails:  Right,Left Home Layout: Multi-level,Able to live on main level with bedroom/bathroom Alternate Level Stairs-Number of Steps: garage is in basement full flight to main level; could enter at front door (5 steps with railings) Bathroom Shower/Tub: Walk-in shower (3 in lip) Additional Comments: has TTB/shower chair that husband uses  Lives With: Spouse IADL History Current License: Yes Occupation: Retired Prior Function Level of Independence: Independent with basic ADLs,Independent with homemaking with ambulation,Independent with transfers,Independent with gait  Able to Take Stairs?: Yes Driving: Yes 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 Comments: Caregiver of her husband Vision Baseline Vision/History: Wears glasses (Left homonymous hemianopia from remote CVA) Patient Visual Report: Blurring of vision (c/o of blurry vision since 12/21 and that she needs a new glasses prescription) Vision Assessment?: Vision impaired- to be further tested in functional context Perception  Perception:  (R inattention) Body Scheme: reports feeling like she is leaning to the L when she is leaning to the R, impaired propioception during functional transfers Spatial Orientation: reports feeling like she is leaning to the L when she is leaning to the R, impaired propioception during functional transfers Praxis Praxis: Intact Cognition Overall Cognitive Status: Impaired/Different from baseline Arousal/Alertness: Awake/alert Orientation Level: Person;Place;Situation Person: Oriented Place: Oriented Situation: Oriented Year: 2022 Month: January Day of Week: Correct Memory: Appears intact Immediate Memory Recall: Bed;Blue;Sock Memory Recall Sock: Not able to recall Memory Recall Blue: Without Cue Memory Recall Bed: Without Cue Attention: Sustained Sustained Attention: Appears intact Awareness: Impaired Awareness Impairment: Emergent  impairment Problem Solving: Impaired Problem Solving Impairment: Functional complex Safety/Judgment: Impaired (impulsive with func transfers) Sensation Sensation Light Touch: Appears Intact Hot/Cold: Appears Intact Proprioception: Impaired by gross assessment Stereognosis: Appears Intact Coordination Gross Motor Movements are Fluid and Coordinated: No Fine Motor Movements are Fluid and Coordinated: No Coordination and Movement Description: Slowed R digit - thumb opposition; R hemiplegia RLE>RUE Motor  Motor Motor: Hemiplegia;Abnormal tone;Abnormal postural alignment and control Motor - Skilled Clinical Observations: R hemiplegia RLE>RUE  Trunk/Postural Assessment  Cervical Assessment Cervical Assessment: Within Functional Limits Thoracic Assessment Thoracic Assessment: Exceptions to [facility] (rounded shoulders) Lumbar Assessment Lumbar Assessment: Exceptions to [facility] (posterior pelvic tilt) Postural Control Postural Control: Deficits on evaluation (moderate R/posterior lean sitting EOB requiring mod to max to correct)  Balance Balance Balance Assessed: Yes Static Sitting Balance Static Sitting - Balance Support: Feet supported Static Sitting - Level of Assistance: 3: Mod assist (intermittant mod A to maintain static sitting balance) Dynamic Sitting Balance Dynamic Sitting - Balance Support: Feet supported Dynamic Sitting - Level of Assistance: 3: Mod assist (intermittant mod A to maintain static sitting balance) Static Standing Balance Static Standing - Balance Support:  Bilateral upper extremity supported Static Standing - Level of Assistance: 2: Max assist (intermittant max A to maintain upright posture in stedy) Extremity/Trunk Assessment RUE Assessment RUE Assessment: Exceptions to [facility] Active Range of Motion (AROM) Comments: 3/4 to full shoulder flexion General Strength Comments: 3/5 in shoulder flexion, 4/5 grip strength RUE Body System: Neuro Brunstrum levels for arm and  hand: Arm;Hand Brunstrum level for arm: Stage IV Movement is deviating from synergy Brunstrum level for hand: Stage V Independence from basic synergies LUE Assessment LUE Assessment: Within Functional Limits Active Range of Motion (AROM) Comments: 3/4 to full shoulder flexion General Strength Comments: 5/5 in shoulder flexion, 5/5 grip strength  Care Tool Care Tool Self Care Eating   Eating Assist Level: Moderate Assistance - Patient 50 - 74%    Oral Care    Oral Care Assist Level: Moderate Assistance - Patient 50 - 74%    Bathing   Body parts bathed by patient: Right arm;Left arm;Chest;Abdomen;Front perineal area;Right upper leg;Left upper leg;Face Body parts bathed by helper: Right lower leg;Left lower leg;Buttocks   Assist Level: Dependent - Patient 0% (stedy for STS to bathe buttocks)    Upper Body Dressing(including orthotics)   What is the patient wearing?: Pull over shirt   Assist Level: Moderate Assistance - Patient 50 - 74%    Lower Body Dressing (excluding footwear)   What is the patient wearing?: Underwear/pull up;Pants Assist for lower body dressing: Dependent - Patient 0% (stedy for STS)    Putting on/Taking off footwear     Assist for footwear: Total Assistance - Patient < 25%       Care Tool Toileting Toileting activity   Assist for toileting: Dependent - Patient 0% (stedy)     Care Tool Bed Mobility Roll left and right activity   Roll left and right assist level: (P) Moderate Assistance - Patient 50 - 74%    Sit to lying activity   Sit to lying assist level: (P) Moderate Assistance - Patient 50 - 74%    Lying to sitting edge of bed activity   Lying to sitting edge of bed assist level: (P) Moderate Assistance - Patient 50 - 74%     Care Tool Transfers Sit to stand transfer   Sit to stand assist level: (P) Maximal Assistance - Patient 25 - 49%    Chair/bed transfer   Chair/bed transfer assist level: (P) Maximal Assistance - Patient 25 - 49%      Toilet transfer   Assist Level: Dependent - Patient 0%     Care Tool Cognition Expression of Ideas and Wants Expression of Ideas and Wants: Without difficulty (complex and basic) - expresses complex messages without difficulty and with speech that is clear and easy to understand   Understanding Verbal and Non-Verbal Content Understanding Verbal and Non-Verbal Content: Understands (complex and basic) - clear comprehension without cues or repetitions   Memory/Recall Ability *first 3 days only Memory/Recall Ability *first 3 days only: Current season;Location of own room;Staff names and faces;That he or she is in a hospital/hospital unit    Refer to Care Plan for [place] OT Short Term Goal 1 (Week 1): Pt will complete BSC/toilet transfer with mod A without lift. OT Short Term Goal 2 (Week 1): Pt will maintain static sitting balance to complete seated grooming unsupported with min A. OT Short Term Goal 3 (Week 1): Pt will don shirt with min A.  Recommendations for other services: Therapeutic Recreation  Pet therapy, Kitchen group, Stress management and Outing/community reintegration   Skilled Therapeutic Intervention ADL ADL Eating: Moderate assistance (for balance) Where Assessed-Eating: Edge of bed Grooming: Moderate assistance (for balance) Where Assessed-Grooming: Edge of bed Upper Body Bathing: Moderate assistance (for balance) Where Assessed-Upper Body Bathing: Edge of bed Lower Body Bathing: Dependent (stedy for STS) Where Assessed-Lower Body Bathing: Edge of bed Upper Body Dressing: Moderate assistance Where Assessed-Upper Body Dressing: Edge of bed Lower Body Dressing: Dependent (stedy for STS) Where Assessed-Lower Body Dressing: Edge of bed Toileting: Dependent (stedy for STS/hygeine/clothing) Where Assessed-Toileting: Glass blower/designer: Dependent (stedy) Toilet Transfer Method: Other (comment) (stedy) Science writer:  Bedside commode;Grab bars Social research officer, government: Dependent Social research officer, government Method: Other (comment) (stedy) Walk-In Engineer, site: Manufacturing systems engineer  Bed Mobility Bed Mobility: Rolling Right;Right Sidelying to Sit;Sit to Sidelying Right;Rolling Left Rolling Right: Moderate Assistance - Patient 50-74% Rolling Left: Moderate Assistance - Patient 50-74% Right Sidelying to Sit: Moderate Assistance - Patient 50-74% Sit to Supine: Moderate Assistance - Patient 50-74% Sit to Sidelying Right: Moderate Assistance - Patient 50-74% Transfers Sit to Stand: Dependent - mechanical lift (stedy) Stand to Sit: Dependent - mechanical lift (stedy)  Session Note: Pt received semi-reclined in bed requesting to toilet, agreeable to OT eval. Daughter present throughout majority of session. Reviewed role of CIR OT, OT POC, safety plan, and DC recommendations. Evaluation completed as documented above with session focus on toileting, EOB bathing and dressing with above assist levels. Completed squat-pivot bed>w/c>toilet with overall total A + 2 (pt 20%). Req 2 pivots to complete transfer. Cont void of b/b. Stedy used 2/2 time management/energy conservation for remainder of session. Initial STS in stedy with min A to power up and maintain balance, regressing to require intermittent mod to max to maintain upright posture 2/2 fatigue throughout session. Sitting EOB, req intermittent mod A to maintain static sitting balance 2/2 mod R/posterior lean. 1 episode of LOB posteriorly on bed req total A to correct. Pt is eager to be able to get to the toilet herself and return to caregiving for her husband, who is currently hospitalized [facility]. Daughter would like pt to be able to walk again, and states she will be moving in with her parents upon DC, currently considering moving husband into ALF and can hire PCAs for pt if necessary.   Pt left semi-reclined in bed with call bell in reach, bed alarm engaged, and  all immediate needs met.    Discharge Criteria: Patient will be discharged from OT if patient refuses treatment 3 consecutive times without medical reason, if treatment goals not met, if there is a change in medical status, if patient makes no progress towards goals or if patient is discharged from hospital.  The above assessment, treatment plan, treatment alternatives and goals were discussed and mutually agreed upon: by patient and by family  Volanda Napoleon MS, OTR/L  12/30/2020, 12:32 PM

## 2020-12-30 NOTE — H&P (Signed)
Physical Medicine and Rehabilitation Admission H&P        Chief Complaint  Patient presents with  . Cerebrovascular Accident  : HPI: [name] is a 70 year old right-handed female with history of CVA 2004 with residual left lateral visual field deficits, history of DVT and antiphospholipid antibody syndrome diagnosed 2004 followed by Dr. Otilio Miu maintained on chronic Coumadin, CKD stage III, hypertension, hyperlipidemia, hypothyroidism and mild cognitive impairments maintained on Aricept.  Per chart review patient lives with spouse.  Multilevel home bed and bath main level 5 steps to entry.  Husband is limited physically to assist as personal care attendance and currently is admitted to [facility] as a renal transplant.  They do have a daughter here visiting from [place] at present.  Patient reportedly independent prior to admission.  Presented 12/26/2020 with acute onset of right leg weakness.  Cranial CT scan showed no acute findings.  Multiple old infarcts in the right occipital lobe at the right parietal vertex and at the left frontal convexity as well as chronic small vessel ischemic changes in the white matter.  Patient did not receive TPA.  MRI showed small acute infarct within the anterior right frontal lobe.  No hemorrhage or mass-effect.  Distal occlusion of the right PCA likely chronic given old PCA territory infarction.  Moderate stenosis of the left P4 segment.  Admission chemistries unremarkable except BUN 30 creatinine 1.68 glucose 102, urine drug screen negative, hemoglobin 11.1, platelets 86,000, INR 1.2..  Echocardiogram with ejection fraction of 55 to 60% no wall motion abnormalities.  Grade 1 diastolic dysfunction.  Neurology follow-up patient currently remains on chronic Coumadin therapy with Lovenox till INR therapeutic with goal INR of 2.5-3.5 as well as low-dose aspirin.  Follow-up oncology services for thrombocytopenia maintained on Coumadin therapy.  Peripheral  smear revealed no evidence of schistocytes and recommendations of routine follow-up CBC.  Bilateral lower extremity Dopplers negative.  Patient with multiple bouts of loose stool 3-4 for the past few weeks.  No abdominal pain or nausea vomiting.  Recommendations are for outpatient GI follow-up.  Orders for basic stool studies include GI pathogen panel with results pending and currently on enteric precautions.  Therapy evaluations completed recommendations inpatient rehab services secondary right side weakness and decreased functional ability as well as cognitive deficits.   Review of Systems  Constitutional: Negative for chills and fever.  HENT: Negative for hearing loss.   Eyes: Negative for blurred vision and double vision.       Left lateral visual field deficits after CVA 2004  Respiratory: Negative for cough and shortness of breath.   Cardiovascular: Positive for leg swelling. Negative for chest pain and palpitations.  Gastrointestinal: Positive for diarrhea. Negative for constipation, heartburn, melena, nausea and vomiting.       GERD  Genitourinary: Negative for dysuria, flank pain and hematuria.  Musculoskeletal: Positive for myalgias.  Skin: Negative for rash.  Neurological: Positive for weakness.  Psychiatric/Behavioral: Positive for depression and memory loss.  All other systems reviewed and are negative.       Past Medical History:  Diagnosis Date  . Arthritis 04/02/1996  . Depression    . GERD (gastroesophageal reflux disease) 04/03/2019  . Hyperlipidemia    . Hypertension    . Renal insufficiency    . Stroke ([place])    . Thyroid disease           Past Surgical History:  Procedure Laterality Date  . CESAREAN SECTION      .  CHOLECYSTECTOMY      . COLONOSCOPY   2012    repeat in 10 yrs  . KNEE ARTHROSCOPY W/ MENISCAL REPAIR Left           Family History  Problem Relation Age of Onset  . Stroke Father    . Breast cancer Maternal Grandmother    . Kidney cancer  Mother    . Cancer Mother    . Arthritis Brother      Social History:  reports that she has never smoked. She has never used smokeless tobacco. She reports current alcohol use of about 1.0 standard drink of alcohol per week. She reports that she does not use drugs. Allergies:      Allergies  Allergen Reactions  . Penicillin G Rash          Medications Prior to Admission  Medication Sig Dispense Refill  . atorvastatin (LIPITOR) 10 MG tablet Take 1 tablet (10 mg total) by mouth daily. 90 tablet 1  . donepezil (ARICEPT) 10 MG tablet Take 1 tablet by mouth daily.      Marland Kitchen escitalopram (LEXAPRO) 20 MG tablet Take 1 tablet (20 mg total) by mouth daily. 90 tablet 0  . famotidine (PEPCID) 20 MG tablet Take 1 tablet by mouth daily. singh      . ferrous sulfate 325 (65 FE) MG EC tablet Take 1 tablet (325 mg total) by mouth daily with breakfast. (Patient taking differently: Take 325 mg by mouth at bedtime.) 30 tablet 3  . levothyroxine (SYNTHROID) 75 MCG tablet Take 1 tablet (75 mcg total) by mouth daily. 30 tablet 5  . losartan (COZAAR) 25 MG tablet Take 25 mg by mouth daily.      . montelukast (SINGULAIR) 10 MG tablet TAKE 1 TABLET BY MOUTH EVERYDAY AT BEDTIME 90 tablet 3  . Multiple Vitamins-Minerals (CENTRUM SILVER 50+WOMEN PO) Take 1 tablet by mouth daily.      . solifenacin (VESICARE) 5 MG tablet Take 5 mg by mouth daily.      Marland Kitchen torsemide (DEMADEX) 5 MG tablet Take 1 tablet by mouth daily. singh      . traZODone (DESYREL) 50 MG tablet Take 1 tablet (50 mg total) by mouth daily. 30 tablet 5  . vitamin B-12 (CYANOCOBALAMIN) 1000 MCG tablet Take 1,000 mcg by mouth daily.      Marland Kitchen warfarin (COUMADIN) 2 MG tablet TAKE 1 TABLET BY MOUTH EVERY DAY (Patient taking differently: Take 2 mg by mouth See admin instructions. Take 1 tablet on Tuesday, Thursday, Saturday and  Sunday.) 90 tablet 0  . warfarin (COUMADIN) 3 MG tablet TAKE 1 TABLET BY MOUTH EVERY DAY (Patient taking differently: Take 3 mg by mouth  See admin instructions. Take 1 tablet on Monday, Wednesday, and Friday.) 90 tablet 0  . mupirocin ointment (BACTROBAN) 2 % Place 1 application into the nose 2 (two) times daily. 22 g 0      Drug Regimen Review Drug regimen was reviewed and remains appropriate with no significant issues identified   Home: Home Living Family/patient expects to be discharged to:: Private residence Living Arrangements: Spouse/significant other Available Help at Discharge: Family,Available 24 hours/day,Personal care attendant,Available PRN/intermittently (Pt and PCA caregive for husbnad - unclear if he can provide much physical assistance) Home Access: Stairs to enter Entrance Stairs-Number of Steps: 5 steps to enter, or full flight from garage. Home Layout: Multi-level,Able to live on main level with bedroom/bathroom Alternate Level Stairs-Number of Steps: garage is in basement full flight to main  level; could enter at front door (5 steps with railings) Home Equipment: Hospital bed (eqipment for her husband)   Functional History: Prior Function Level of Independence: Independent Comments: Caregiver of her husband   Functional Status:  Mobility: Bed Mobility Overal bed mobility: Needs Assistance Bed Mobility: Supine to Sit,Sit to Supine Supine to sit: Mod assist Sit to supine: Max assist Transfers Overall transfer level: Needs assistance Equipment used: Rolling walker (2 wheeled) Transfers: Sit to/from [employer] Sit to Stand: Max assist,From elevated surface Stand pivot transfers: Max assist General transfer comment:  (Therapy session involved several transfers bed to chair, chair <> BSC with MaxA transferring toward Left side. Nursing present and educated on safe transfers.)   ADL: ADL Overall ADL's : Needs assistance/impaired General ADL Comments: Pt. was independent with self-feeding skills with complete set-up, requiring increased time to complete the task, and cues for  strategies when using her right hand during the task, as well as cues for posiiton of the utensil in the right hand.   Cognition: Cognition Overall Cognitive Status: Within Functional Limits for tasks assessed Orientation Level: Oriented X4 Cognition Arousal/Alertness: Awake/alert Behavior During Therapy: WFL for tasks assessed/performed Overall Cognitive Status: Within Functional Limits for tasks assessed General Comments: Poor insight into deficits; poor problem solving related to spacial awareness limitations; has baseline visual field deficits from prior CVA   Physical Exam: Blood pressure (!) 142/87, pulse 79, temperature 98.5 F (36.9 C), resp. rate 16, height 5\' 8"  (1.727 m), weight 85 kg, SpO2 99 %. Physical Exam Neurological:     Comments: Patient is awake alert in no acute distress. Speech is fluent.  Oriented to person and place.     General: No acute distress Mood and affect are appropriate Heart: Regular rate and rhythm no rubs murmurs or extra sounds Lungs: Clear to auscultation, breathing unlabored, no rales or wheezes Abdomen: Positive bowel sounds, soft nontender to palpation, nondistended Extremities: No clubbing, cyanosis, or edema Skin: No evidence of breakdown, no evidence of rash Neurologic: Cranial nerves II through XII intact, motor strength is 5/5 in bilateral deltoid, bicep, tricep, grip, Left hip flexor, knee extensors, ankle dorsiflexor and plantar flexor 0/5 RLE except 2- R hip add    Sensory exam normal sensation to light touch and proprioception in bilateral upper and lower extremities Cerebellar exam normal finger to nose to finger as well as heel to shin in bilateral upper and lower extremities Musculoskeletal: Full range of motion in all 4 extremities. No joint swelling    Lab Results Last 48 Hours        Results for orders placed or performed during the hospital encounter of 12/26/20 (from the past 48 hour(s))  Pathologist smear review     Status:  None    Collection Time: 12/28/20  3:50 AM  Result Value Ref Range    Path Review Blood smear is reviewed.        Comment: Morphology of RBCs, WBCs, and platelets within normal limits. No evidence of circulating blasts or schistocytes. Reviewed by Elmon Kirschner, M.D. Performed at [facility], [address] [place], [place] [address]    TSH     Status: Abnormal    Collection Time: 12/28/20  1:45 PM  Result Value Ref Range    TSH 11.289 (H) 0.350 - 4.500 uIU/mL      Comment: Performed by a 3rd Generation assay with a functional sensitivity of <=0.01 uIU/mL. Performed at [facility], [address] [place],  [place] [address]    T4, free     Status: None    Collection Time: 12/28/20  1:45 PM  Result Value Ref Range    Free T4 0.85 0.61 - 1.12 ng/dL      Comment: (NOTE) Biotin ingestion may interfere with free T4 tests. If the results are inconsistent with the TSH level, previous test results, or the clinical presentation, then consider biotin interference. If needed, order repeat testing after stopping biotin. Performed at [facility], [address] [place], [place] [address]    CBC with Differential     Status: Abnormal    Collection Time: 12/29/20  5:43 AM  Result Value Ref Range    WBC 4.4 4.0 - 10.5 K/uL    RBC 3.39 (L) 3.87 - 5.11 MIL/uL    Hemoglobin 11.2 (L) 12.0 - 15.0 g/dL    HCT 33.4 (L) 36.0 - 46.0 %    MCV 98.5 80.0 - 100.0 fL    MCH 33.0 26.0 - 34.0 pg    MCHC 33.5 30.0 - 36.0 g/dL    RDW 12.2 11.5 - 15.5 %    Platelets 85 (L) 150 - 400 K/uL    nRBC 0.0 0.0 - 0.2 %    Neutrophils Relative % 52 %    Neutro Abs 2.3 1.7 - 7.7 K/uL    Lymphocytes Relative 35 %    Lymphs Abs 1.5 0.7 - 4.0 K/uL    Monocytes Relative 8 %    Monocytes Absolute 0.4 0.1 - 1.0 K/uL    Eosinophils Relative 3 %    Eosinophils Absolute 0.1 0.0 - 0.5 K/uL    Basophils Relative 1 %    Basophils Absolute 0.0 0.0 - 0.1 K/uL    Immature Granulocytes 1  %    Abs Immature Granulocytes 0.02 0.00 - 0.07 K/uL      Comment: Performed at [facility], [address] [place], [place] [address]  Immature Platelet Fraction     Status: None    Collection Time: 12/29/20  5:43 AM  Result Value Ref Range    Immature Platelet Fraction 6.2 1.2 - 8.6 %      Comment: Performed at [facility], [address] [place], [place] [address]  Folate, serum, performed at [facility] lab     Status: None    Collection Time: 12/29/20  5:43 AM  Result Value Ref Range    Folate 16.8 >5.9 ng/mL      Comment: Performed at [facility], [place], [place] [address]       Imaging Results (Last 48 hours)  US Venous Img Lower Bilateral (DVT)   Result Date: 12/27/2020 CLINICAL DATA:  Bilateral leg pain EXAM: BILATERAL LOWER EXTREMITY VENOUS DOPPLER ULTRASOUND TECHNIQUE: Gray-scale sonography with graded compression, as well as color Doppler and duplex ultrasound were performed to evaluate the lower extremity deep venous systems from the level of the common femoral vein and including the common femoral, femoral, profunda femoral, popliteal and calf veins including the posterior tibial, peroneal and gastrocnemius veins when visible. The superficial great saphenous vein was also interrogated. Spectral Doppler was utilized to evaluate flow at rest and with distal augmentation maneuvers in the common femoral, femoral and popliteal veins. COMPARISON:  None. FINDINGS: RIGHT LOWER EXTREMITY Common Femoral Vein: No evidence of thrombus. Normal compressibility, respiratory phasicity and response to augmentation. Saphenofemoral Junction: No evidence of thrombus. Normal compressibility and flow on color Doppler imaging. Profunda Femoral Vein:  No evidence of thrombus. Normal compressibility and flow on color Doppler imaging. Femoral Vein: No evidence of thrombus. Normal compressibility, respiratory phasicity and response to augmentation. Popliteal  Vein: No evidence of thrombus. Normal compressibility, respiratory phasicity and response to augmentation. Calf Veins: No evidence of thrombus. Normal compressibility and flow on color Doppler imaging. Superficial Great Saphenous Vein: No evidence of thrombus. Normal compressibility. Venous Reflux:  None. Other Findings:  None. LEFT LOWER EXTREMITY Common Femoral Vein: No evidence of thrombus. Normal compressibility, respiratory phasicity and response to augmentation. Saphenofemoral Junction: No evidence of thrombus. Normal compressibility and flow on color Doppler imaging. Profunda Femoral Vein: No evidence of thrombus. Normal compressibility and flow on color Doppler imaging. Femoral Vein: No evidence of thrombus. Normal compressibility, respiratory phasicity and response to augmentation. Popliteal Vein: No evidence of thrombus. Normal compressibility, respiratory phasicity and response to augmentation. Calf Veins: No evidence of thrombus. Normal compressibility and flow on color Doppler imaging. Superficial Great Saphenous Vein: No evidence of thrombus. Normal compressibility. Venous Reflux:  None. Other Findings:  None. IMPRESSION: No evidence of deep venous thrombosis in either lower extremity. Electronically Signed   By: Inez Catalina M.D.   On: 12/27/2020 11:58    ECHOCARDIOGRAM COMPLETE   Result Date: 12/27/2020    ECHOCARDIOGRAM REPORT   Patient Name:   [name] Date of Exam: 12/27/2020 Medical Rec #:  [id number]          Height:       68.0 in Accession #:    [id number]         Weight:       186.0 lb Date of Birth:  [birth year]         BSA:          1.982 m Patient Age:    68 years           BP:           162/80 mmHg Patient Gender: F                  HR:           74 bpm. Exam Location:  [facility] Procedure: 2D Echo, Color Doppler, Cardiac Doppler and Intracardiac            Opacification Agent Indications:     I163.9 Stroke  History:         Patient has no prior history of Echocardiogram  examinations.                  Risk Factors:Hypertension and Dyslipidemia.  Sonographer:     Charmayne Sheer RDCS (AE) Referring Phys:  [id number] [facility] AMIN Diagnosing Phys: Bartholome Bill MD  Sonographer Comments: Suboptimal apical window. Image acquisition challenging due to patient body habitus. IMPRESSIONS  1. Left ventricular ejection fraction, by estimation, is 55 to 60%. The left ventricle has normal function. The left ventricle has no regional wall motion abnormalities. Left ventricular diastolic parameters are consistent with Grade I diastolic dysfunction (impaired relaxation).  2. Right ventricular systolic function is normal. The right ventricular size is normal.  3. Left atrial size was mildly dilated.  4. The mitral valve is grossly normal. Trivial mitral valve regurgitation.  5. The aortic valve was not well visualized. Aortic valve regurgitation is trivial. FINDINGS  Left Ventricle: Left ventricular ejection fraction, by estimation, is 55 to 60%. The left ventricle has normal function. The left ventricle has no regional wall motion abnormalities. Definity contrast agent was given IV to  delineate the left ventricular  endocardial borders. The left ventricular internal cavity size was normal in size. There is no left ventricular hypertrophy. Left ventricular diastolic parameters are consistent with Grade I diastolic dysfunction (impaired relaxation). Right Ventricle: The right ventricular size is normal. No increase in right ventricular wall thickness. Right ventricular systolic function is normal. Left Atrium: Left atrial size was mildly dilated. Right Atrium: Right atrial size was normal in size. Pericardium: There is no evidence of pericardial effusion. Mitral Valve: The mitral valve is grossly normal. Trivial mitral valve regurgitation. MV peak gradient, 4.9 mmHg. The mean mitral valve gradient is 2.0 mmHg. Tricuspid Valve: The tricuspid valve is not well visualized. Tricuspid valve regurgitation is  trivial. Aortic Valve: The aortic valve was not well visualized. Aortic valve regurgitation is trivial. Aortic valve mean gradient measures 2.0 mmHg. Aortic valve peak gradient measures 4.0 mmHg. Aortic valve area, by VTI measures 2.54 cm. Pulmonic Valve: The pulmonic valve was not well visualized. Pulmonic valve regurgitation is trivial. Aorta: The aortic root is normal in size and structure. IAS/Shunts: No atrial level shunt detected by color flow Doppler.  LEFT VENTRICLE PLAX 2D LVIDd:         4.60 cm  Diastology LVIDs:         3.20 cm  LV e' medial:    7.18 cm/s LV PW:         1.10 cm  LV E/e' medial:  10.8 LV IVS:        0.70 cm  LV e' lateral:   8.05 cm/s LVOT diam:     2.00 cm  LV E/e' lateral: 9.6 LV SV:         42 LV SV Index:   21 LVOT Area:     3.14 cm  RIGHT VENTRICLE RV Basal diam:  3.60 cm LEFT ATRIUM         Index      RIGHT ATRIUM           Index LA diam:    4.30 cm 2.17 cm/m RA Area:     16.00 cm                                RA Volume:   37.90 ml  19.13 ml/m  AORTIC VALVE                   PULMONIC VALVE AV Area (Vmax):    2.13 cm    PV Vmax:       0.95 m/s AV Area (Vmean):   2.38 cm    PV Vmean:      66.500 cm/s AV Area (VTI):     2.54 cm    PV VTI:        0.217 m AV Vmax:           100.00 cm/s PV Peak grad:  3.6 mmHg AV Vmean:          59.900 cm/s PV Mean grad:  2.0 mmHg AV VTI:            0.166 m AV Peak Grad:      4.0 mmHg AV Mean Grad:      2.0 mmHg LVOT Vmax:         67.90 cm/s LVOT Vmean:        45.400 cm/s LVOT VTI:          0.134 m LVOT/AV VTI ratio: 0.81  AORTA Ao Root diam: 2.90 cm MITRAL VALVE MV Area (PHT): 5.58 cm    SHUNTS MV Area VTI:   1.99 cm    Systemic VTI:  0.13 m MV Peak grad:  4.9 mmHg    Systemic Diam: 2.00 cm MV Mean grad:  2.0 mmHg MV Vmax:       1.11 m/s MV Vmean:      67.3 cm/s MV Decel Time: 136 msec MV E velocity: 77.60 cm/s MV A velocity: 96.40 cm/s MV E/A ratio:  0.80 Bartholome Bill MD Electronically signed by Bartholome Bill MD Signature Date/Time:  12/27/2020/4:38:53 PM    Final              Medical Problem List and Plan: 1.  Right leg weakness secondary to small acute infarct within the anterior right frontal lobe.  Multiple old infarcts and chronic small vessel ischemia.             -patient may  shower             -ELOS/Goals: 14-18d 2.  Antithrombotics: -DVT/anticoagulation: Chronic Coumadin therapy.  Continue Lovenox till INR therapeutic 2.5-3.5             -antiplatelet therapy: Aspirin 81 mg daily 3. Pain Management: Tylenol as needed 4. Mood: Aricept 10 mg daily, Lexapro 20 mg daily             -antipsychotic agents: N/A 5. Neuropsych: This patient is capable of making decisions on her own behalf. 6. Skin/Wound Care: Routine skin checks 7. Fluids/Electrolytes/Nutrition: Routine in and outs with follow-up chemistries 8.  Antiphospholipid antibody syndrome/thrombocytopenia.  Follow-up hematology services.  Follow-up CBC.  Continue Coumadin therapy 9.  Hypertension.  Cozaar 25 mg daily.  Monitor with increased mobility Vitals:   12/30/20 0310 12/30/20 0412  BP: 121/73 119/70  Pulse: 68 67  Resp: 16 16  Temp: 98 F (36.7 C) 98.1 F (36.7 C)  SpO2: 100% 97%   10.  Hypothyroidism.  Synthroid 11.  Hyperlipidemia.  Lipitor 12.  CKD stage III.  Follow-up chemistries 13.  Diarrhea.  Patient was treated for loose stools daily for the past few weeks.  Plan outpatient follow-up GI.  Basic stool studies including GI pathogen panel fecal lactoferrin TSH and free T4 pending.  Maintain precautions until work-up completed.  Continue Imodium as needed .   Lavon Paganini Angiulli, PA-C 12/29/2020

## 2020-12-30 NOTE — IPOC Note (Addendum)
Overall Plan of Care [facility]) Patient Details Name: [name] MRN: [id number] DOB: 15-Jul-1951  Admitting Diagnosis: <principal problem not specified>  Hospital Problems: Active Problems:   Ischemic cerebrovascular accident (CVA) of frontal lobe ([place])     Functional Problem List: Nursing Motor,Perception,Safety,Skin Integrity  PT Balance,Behavior,Edema,Endurance,Motor,Nutrition,Pain,Perception,Safety,Sensory,Skin Integrity  OT Balance,Skin Integrity,Vision,Endurance,Motor,Perception  SLP    TR         Basic ADL's: OT Eating,Grooming,Bathing,Dressing,Toileting     Advanced  ADL's: OT       Transfers: PT Bed Mobility,Bed to [place]  OT Toilet,Tub/Shower     Locomotion: PT Ambulation,Wheelchair Mobility,Stairs     Additional Impairments: OT Fuctional Use of Upper Extremity  SLP None      TR      Anticipated Outcomes Item Anticipated Outcome  Self Feeding mod I  Swallowing      Basic self-care  min A  Toileting  min A   Bathroom Transfers min A  Bowel/Bladder  Pt will maintain bowel and bladder continence during hospital stay  Transfers  CGA stand pivot transfers  Locomotion  Min assist ambulatory  Communication     Cognition     Pain  Pt will maintain little to no pain during hospital stay  Safety/Judgment  Pt will maintain safety/judgment awareness during hospital stay.   Therapy Plan: PT Intensity: Minimum of 1-2 x/day ,45 to 90 minutes PT Frequency: 5 out of 7 days PT Duration Estimated Length of Stay: 3.5-4 weeks OT Intensity: Minimum of 1-2 x/day, 45 to 90 minutes OT Frequency: 5 out of 7 days OT Duration/Estimated Length of Stay: 23-25 days SLP Duration/Estimated Length of Stay: No SLP tx recommended. Cognitive linguistic skills are at baseline.   Due to the current state of emergency, patients may not be receiving their 3-hours of Medicare-mandated therapy.   Team Interventions: Nursing Interventions Medication  Management,Discharge Planning,Bowel Management,Skin Care/Wound Management,Psychosocial Support  PT interventions Ambulation/gait training,Balance/vestibular training,Cognitive remediation/compensation,Community reintegration,Discharge planning,Disease IT sales professional stimulation,Functional mobility training,Patient/family education,Pain management,Neuromuscular re-education,Psychosocial support,Skin care/wound Database administrator training,Splinting/orthotics,Therapeutic Exercise,UE/LE Strength taining/ROM,UE/LE Coordination activities,Visual/perceptual remediation/compensation,Wheelchair propulsion/positioning  OT Interventions Barista re-education,Self Care/advanced ADL retraining,Therapeutic Exercise,Wheelchair propulsion/positioning,UE/LE Strength taining/ROM,Skin care/wound managment,Pain management,Community reintegration,Functional electrical stimulation,Patient/family education,Splinting/orthotics,UE/LE Coordination activities,Visual/perceptual remediation/compensation,Therapeutic Activities,Psychosocial support,Functional mobility training,Discharge planning,Cognitive remediation/compensation  SLP Interventions    TR Interventions    SW/CM Interventions     Barriers to Discharge MD  Medical stability  Nursing Home environment access/layout    PT Inaccessible home environment,Decreased caregiver support,Home environment access/layout,Lack of/limited family support,Insurance for SNF coverage,Behavior    OT Decreased caregiver support,Inaccessible home environment Steps to enter home, primary CG for husband  SLP      SW Lack of/limited family support,Decreased caregiver support,Insurance for SNF coverage,Home environment access/layout,Inaccessible home environment Pt spouse in hospital a [employer] daughter potentially  will be unable to care for BOTH parents   Team Discharge Planning: Destination: PT-Home ,OT- Home , SLP-Home Projected Follow-up: PT-Home health PT, OT-  Home health OT, SLP-Home Health SLP Projected Equipment Needs: PT-Wheelchair cushion (measurements),Wheelchair (measurements),To be determined, OT- 3 in 1 bedside comode,Tub/shower bench, SLP-None recommended by SLP Equipment Details: PT- , OT-  Patient/family involved in discharge planning: PT- Patient,Family member/caregiver,  OT-Patient,Family member/caregiver, SLP-Patient  MD ELOS: 14-18d Medical Rehab Prognosis:  Good Assessment:   70 year old right-handed female with history of CVA 2004 with residual left lateral visual field deficits, history of DVT and antiphospholipid antibody syndrome diagnosed 2004 followed by Dr. Otilio Miu maintained on chronic Coumadin, CKD stage III, hypertension, hyperlipidemia,  hypothyroidism and mild cognitive impairments maintained on Aricept. Per chart review patient lives with spouse. Multilevel home bed and bath main level 5 steps to entry. Husband is limited physically to assist as personal care attendance and currently is admitted to [facility] as a renal transplant. They do have a daughter here visiting from [place] at present. Patient reportedly independent prior to admission. Presented 12/26/2020 with acute onset of right leg weakness. Cranial CT scan showed no acute findings. Multiple old infarcts in the right occipital lobe at the right parietal vertex and at the left frontal convexity as well as chronic small vessel ischemic changes in the white matter. Patient did not receive TPA. MRI showed small acute infarct within the anterior right frontal lobe. No hemorrhage or mass-effect. Distal occlusion of the right PCA likely chronic given old PCA territory infarction. Moderate stenosis of the left P4 segment. Admission chemistries unremarkable except BUN 30 creatinine 1.68 glucose 102, urine  drug screen negative, hemoglobin 11.1, platelets 86,000, INR 1.2.. Echocardiogram with ejection fraction of 55 to 60% no wall motion abnormalities. Grade 1 diastolic dysfunction. Neurology follow-up patient currently remains on chronic Coumadin therapy with Lovenox till INR therapeutic with goal INR of 2.5-3.5 as well as low-dose aspirin. Follow-up oncology services for thrombocytopenia maintained on Coumadin therapy. Peripheral smear revealed no evidence of schistocytes and recommendations of routine follow-up CBC. Bilateral lower extremity Dopplers negative. Patient with multiple bouts of loose stool 3-4 for the past few weeks. No abdominal pain or nausea vomiting. Recommendations are for outpatient GI follow-up. Orders for basic stool studies include GI pathogen panel with results pending and currently on enteric precautions. Therapy evaluations completed recommendations inpatient rehab services secondary right side weakness and decreased functional ability as well as cognitive deficits.   Now requiring 24/7 Rehab RN,MD, as well as CIR level PT, OT and SLP.  Treatment team will focus on ADLs and mobility with goals set at [facility]  See Team Conference Notes for weekly updates to the plan of care

## 2020-12-30 NOTE — Progress Notes (Signed)
Physical Therapy Session Note  Patient Details  Name: [name] MRN: [id number] Date of Birth: 08/21/1951  Today's Date: 12/30/2020 PT Individual Time: 1415-1508 PT Individual Time Calculation (min): 53 min   Short Term Goals: Week 1:  PT Short Term Goal 1 (Week 1): pt will transfer to [facility] with mod assist consistently PT Short Term Goal 2 (Week 1): Pt will perform bed mobility with min assist PT Short Term Goal 3 (Week 1): Pt will ambulate 38f with mod assist and BUE support on RW and +2 for safety PT Short Term Goal 4 (Week 1): Pt will propell WC 1012fwith supervision assist  Skilled Therapeutic Interventions/Progress Updates:   Pt received supine in bed and agreeable to PT. Supine>sit transfer with mod assist and cues for awareness of RLE. [facility] and moderate cues for sequencing from PT. [facility] reciprocal movement training 4 x 1.5 min with mod assist from PT on the RLE throughout to improve attention to R side as well as increase ROM into both flexion and extension.   WC mobility with min assist x [id number] moderate cues for attention to the RUE to improve symmetry of WC propulsion, intermittent tactile facilitation to improved scapular retraction and shoulder extension to allow increased force with RUE.   SB transfer back to bed with mod assist and max cues for improved position of the RLE. PT required to block the RLE to prevent pt from sliding off board as well as facilitate increased anterior weight shift to control head/hips relation ship. Sit>supine completed with  and left supine in bed with call bell in reach and all needs met.      Therapy Documentation Precautions:  Precautions Precautions: Fall Precaution Comments: decreased L visual field Restrictions Weight Bearing Restrictions: No Pain:   denies    Therapy/Group: Individual Therapy  AusLorie Phenix28/2022, 6:22 PM

## 2020-12-31 ENCOUNTER — Inpatient Hospital Stay (HOSPITAL_COMMUNITY): Payer: Medicare Other

## 2020-12-31 DIAGNOSIS — I639 Cerebral infarction, unspecified: Secondary | ICD-10-CM | POA: Diagnosis not present

## 2020-12-31 DIAGNOSIS — I63422 Cerebral infarction due to embolism of left anterior cerebral artery: Secondary | ICD-10-CM

## 2020-12-31 LAB — PTT FACTOR INHIBITOR (MIXING STUDY)
aPTT 1:1 NP Incub. Mix Ctl: 42 s — ABNORMAL HIGH (ref 22.9–30.2)
aPTT 1:1 NP Mix, 60 Min,Incub.: 41.9 s — ABNORMAL HIGH (ref 22.9–30.2)
aPTT 1:1 Normal Plasma: 37.4 s — ABNORMAL HIGH (ref 22.9–30.2)
aPTT: 49.3 s — ABNORMAL HIGH (ref 22.9–30.2)

## 2020-12-31 LAB — PROTIME-INR
INR: 1.3 — ABNORMAL HIGH (ref 0.8–1.2)
Prothrombin Time: 15.7 seconds — ABNORMAL HIGH (ref 11.4–15.2)

## 2020-12-31 LAB — MAGNESIUM: Magnesium: 2 mg/dL (ref 1.7–2.4)

## 2020-12-31 IMAGING — MR MR CERVICAL SPINE W/O CM
5 series · 38 of 48 positions shown · non-contrast
Comparison: Cervical spine radiograph [DATE]

CLINICAL DATA: Acute myelopathy. Anti phospholipid antibody
syndrome. On Coumadin.

EXAM:
MRI CERVICAL SPINE WITHOUT CONTRAST
TECHNIQUE: Multiplanar, multisequence MR imaging of the cervical spine was
performed. No intravenous contrast was administered.

[Series 5: T2 · sagittal · 3.0mm · 0.69mm/px · 6 of 15 slices shown (1 of 2)]
[im 1/15]
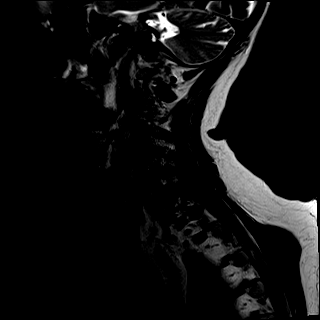
[im 3/15]
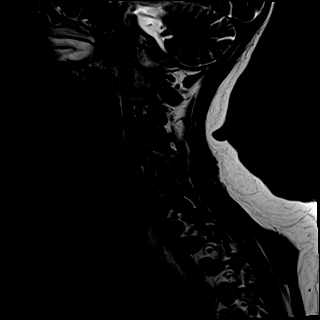
[im 6/15]
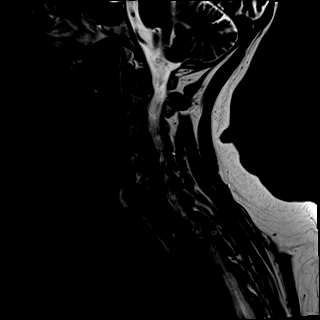
[im 9/15]
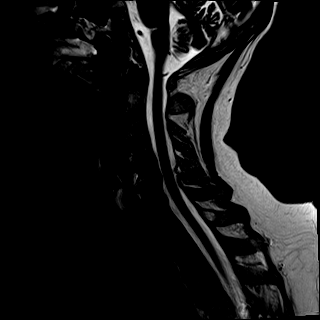
[im 12/15]
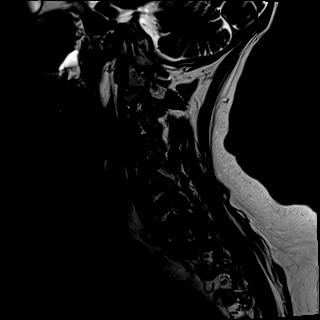
[im 15/15]
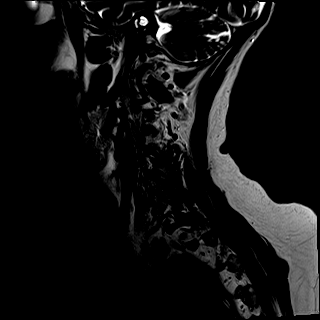

[Series 6: T1 · sagittal · 3.0mm · 0.69mm/px · 6 of 15 slices shown]
[im 1/15]
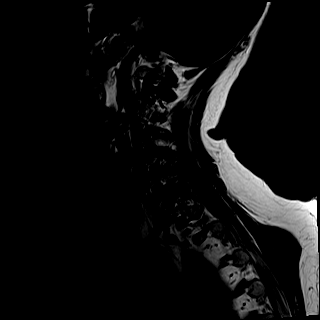
[im 3/15]
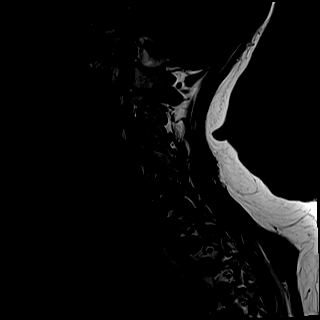
[im 6/15]
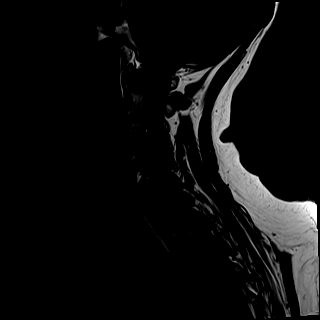
[im 9/15]
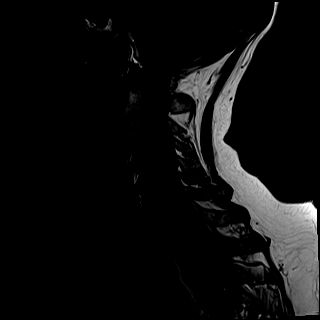
[im 12/15]
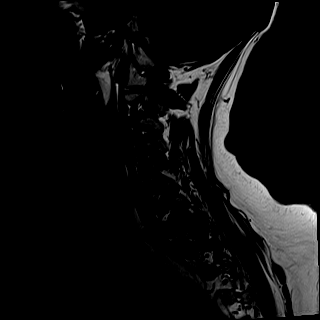
[im 15/15]
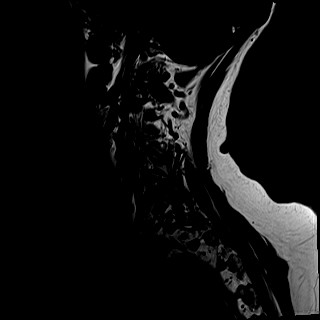

[Series 7: STIR · sagittal · 3.0mm · 0.86mm/px · 6 of 15 slices shown]
[im 1/15]
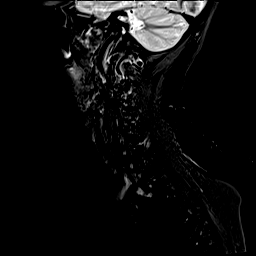
[im 3/15]
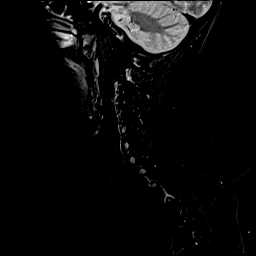
[im 6/15]
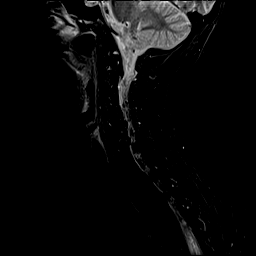
[im 9/15]
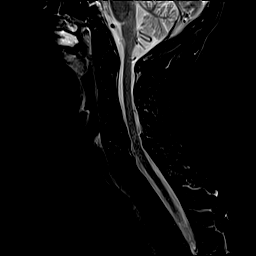
[im 12/15]
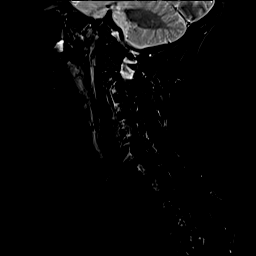
[im 15/15]
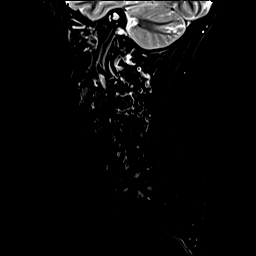

[Series 8: T2 · axial · 3.0mm · 0.66mm/px · z∈[-153,-28]mm · 12 of 40 slices shown (2 of 2)]
[im 1/40]
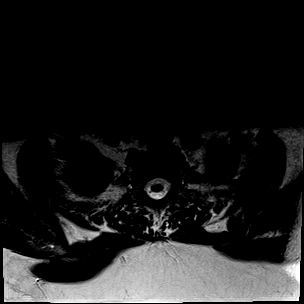
[im 3/40]
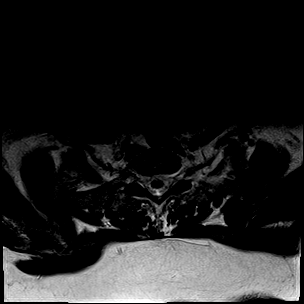
[im 6/40]
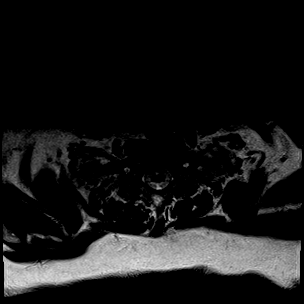
[im 9/40]
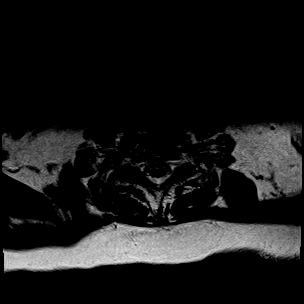
[im 12/40]
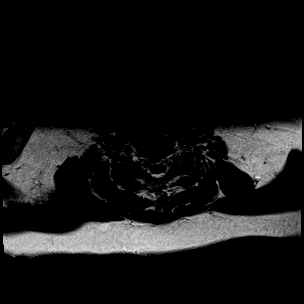
[im 14/40]
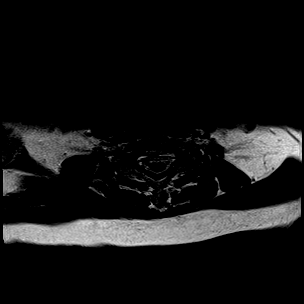
[im 17/40]
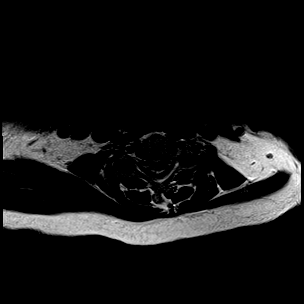
[im 20/40]
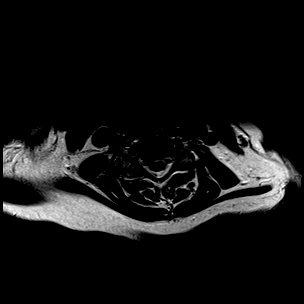
[im 23/40]
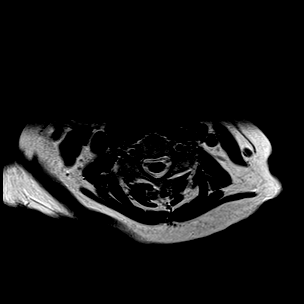
[im 28/40]
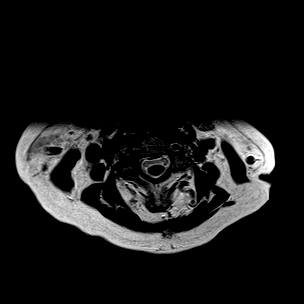
[im 34/40]
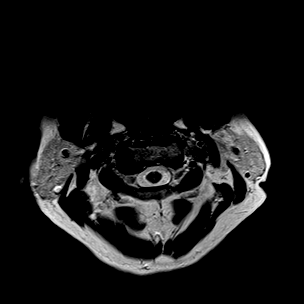
[im 40/40]
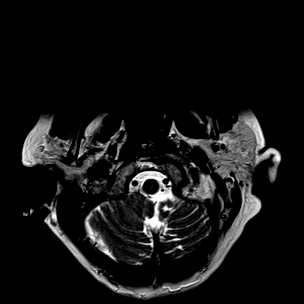

[Series 9: GRE · axial · 3.0mm · 0.39mm/px · z∈[-153,-28]mm · 8 of 40 slices shown]
[im 1/40]
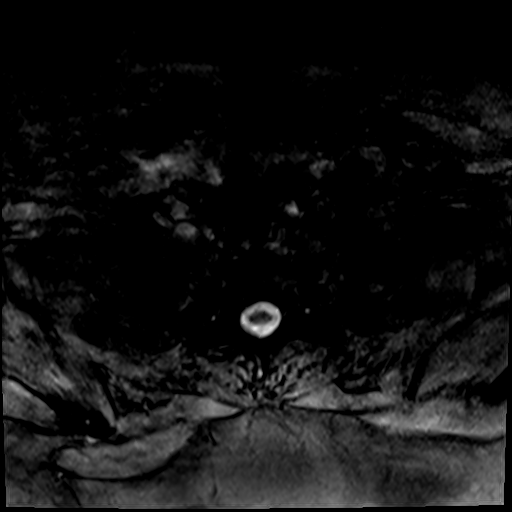
[im 6/40]
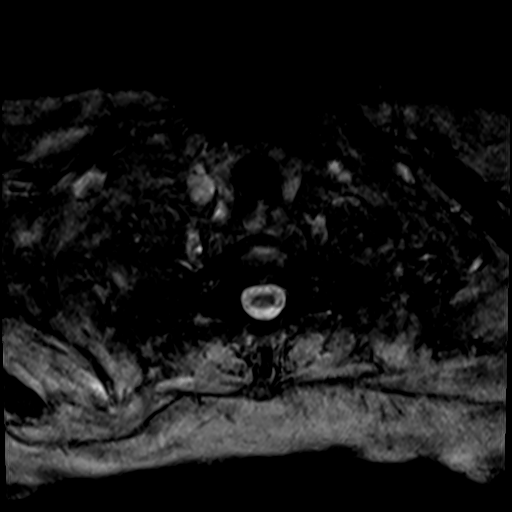
[im 12/40]
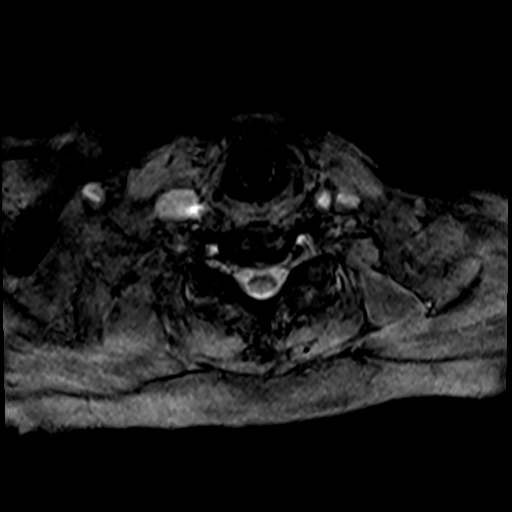
[im 17/40]
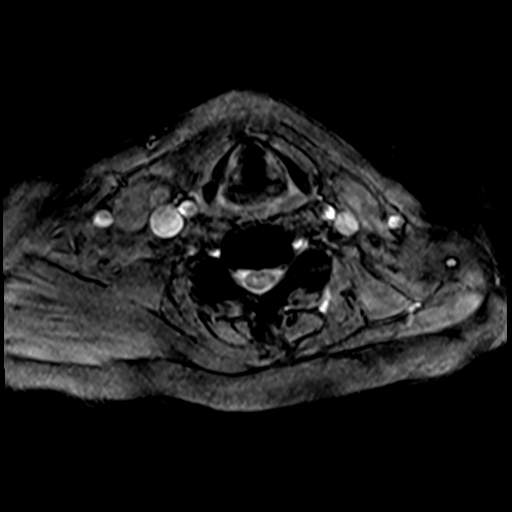
[im 23/40]
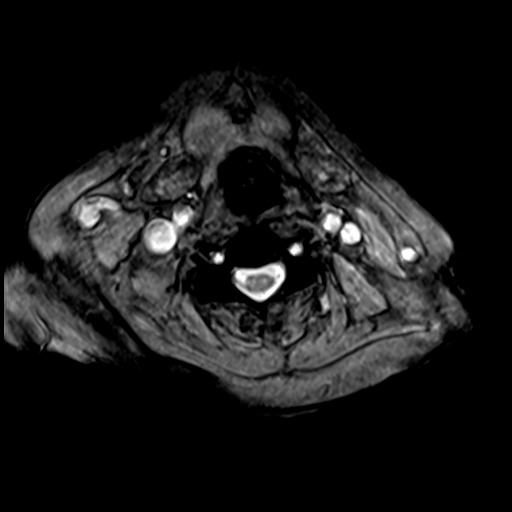
[im 28/40]
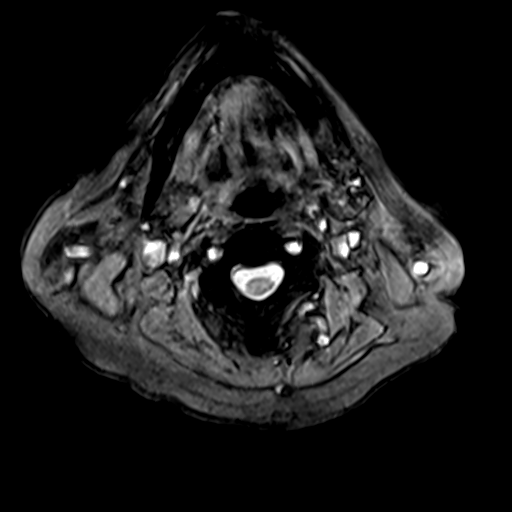
[im 34/40]
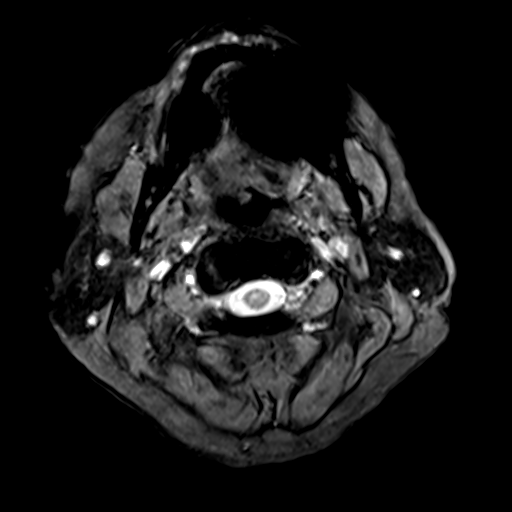
[im 40/40]
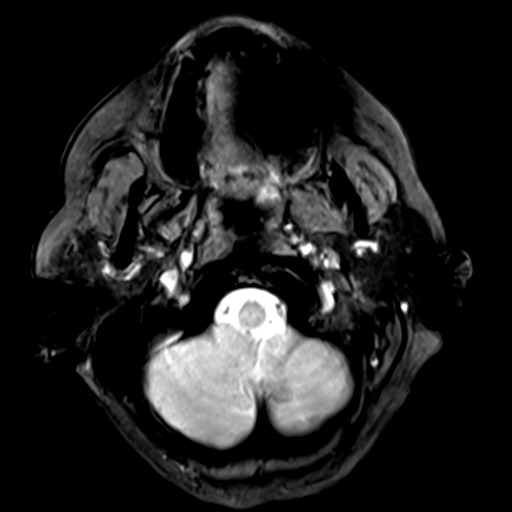

[38 of 48 positions shown; findings below may reference images not displayed]

FINDINGS: Alignment: 3 mm retrolisthesis C5-6.

Vertebrae: Negative for fracture or mass

Cord: Normal signal and morphology

Posterior Fossa, vertebral arteries, paraspinal tissues: Negative

Disc levels:

C2-3: Negative

C3-4: Moderate right foraminal encroachment due to facet
hypertrophy. Spinal canal normal in size.

C4-5: Mild disc degeneration and spurring. Mild right foraminal
narrowing due to uncinate spurring and facet hypertrophy. Left
foramen patent.

C5-6: 3 mm retrolisthesis. Disc degeneration and facet degeneration.
Mild to moderate foraminal encroachment bilaterally. Mild spinal
stenosis.

C6-7: Small central disc protrusion.  Negative for stenosis

C7-T1: Negative
IMPRESSION: Mild right foraminal narrowing C3-4 and C4-5 due to spurring

3 mm retrolisthesis C5-6. Mild spinal stenosis and mild to moderate
foraminal stenosis bilaterally due to spurring.

## 2020-12-31 IMAGING — MR MR HEAD W/O CM
12 of 13 series · 44 of 48 positions shown · non-contrast
Comparison: MRI head [DATE]

CLINICAL DATA: Stroke. Anti phospholipid antibody syndrome. On
Coumadin.

EXAM:
MRI HEAD WITHOUT CONTRAST
TECHNIQUE: Multiplanar, multiecho pulse sequences of the brain and surrounding
structures were obtained without intravenous contrast.

[Series 14: DWI · axial · 3.0mm · 0.88mm/px · z∈[-10,+130]mm · 7 of 96 slices shown (1 of 4)]
[im 1/96]
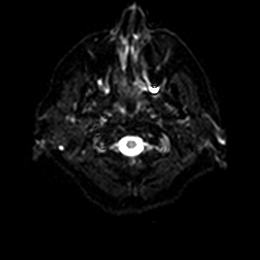
[im 16/96]
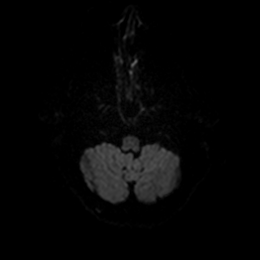
[im 32/96]
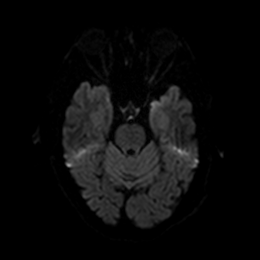
[im 48/96]
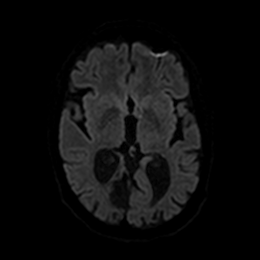
[im 64/96]
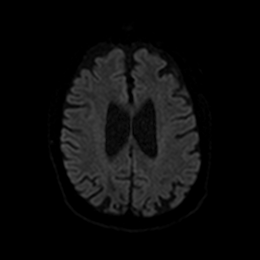
[im 80/96]
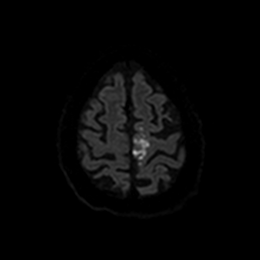
[im 96/96]
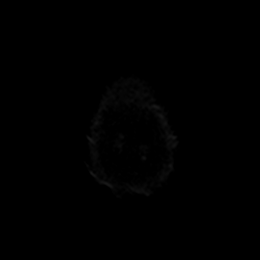

[Series 15: DWI · axial · 3.0mm · 0.88mm/px · z∈[-10,+130]mm · 4 of 48 slices shown (2 of 4)]
[im 1/48]
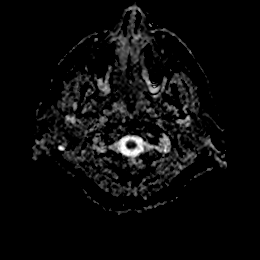
[im 16/48]
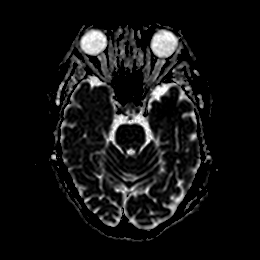
[im 32/48]
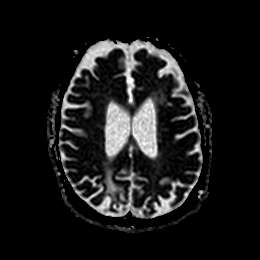
[im 48/48]
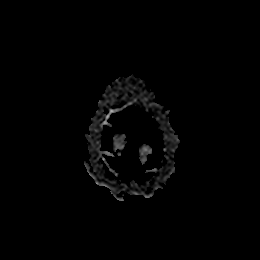

[Series 16: DWI · coronal · 4.0mm · 0.88mm/px · 6 of 70 slices shown (3 of 4)]
[im 1/70]
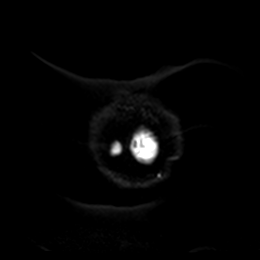
[im 14/70]
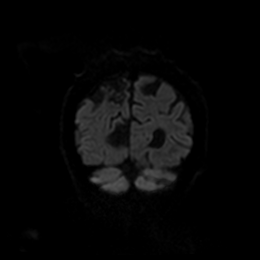
[im 28/70]
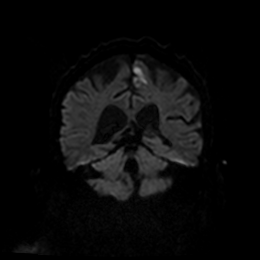
[im 42/70]
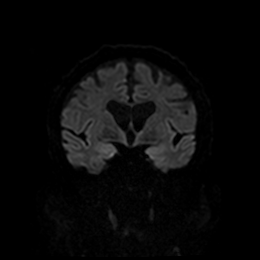
[im 56/70]
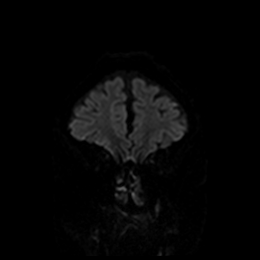
[im 70/70]
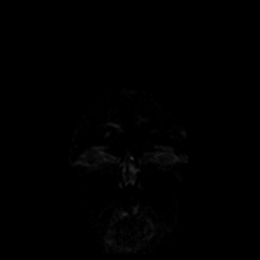

[Series 17: DWI · coronal · 4.0mm · 0.88mm/px · 3 of 35 slices shown (4 of 4)]
[im 1/35]
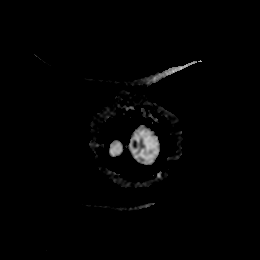
[im 18/35]
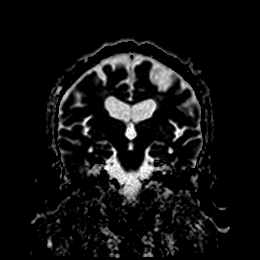
[im 35/35]
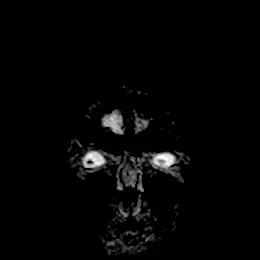

[Series 18: T1 · sagittal · 5.0mm · 0.75mm/px · 2 of 25 slices shown]
[im 1/25]
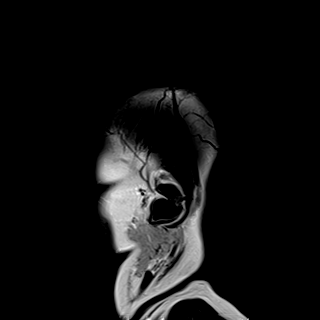
[im 25/25]
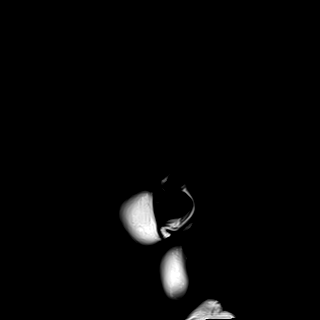

[Series 19: T2 · axial · 5.0mm · 0.72mm/px · z∈[-14,+129]mm · 2 of 25 slices shown (1 of 2)]
[im 1/25]
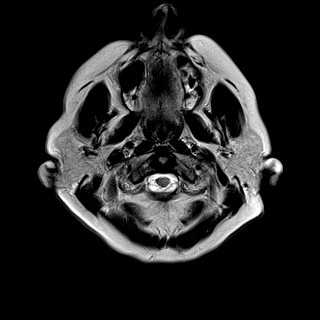
[im 25/25]
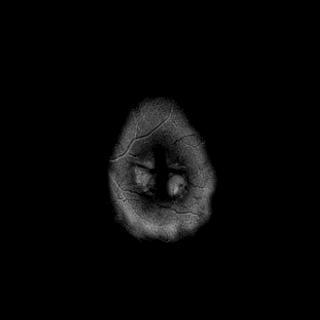

[Series 20: FLAIR · axial · 5.0mm · 0.45mm/px · z∈[-11,+132]mm · 2 of 25 slices shown]
[im 1/25]
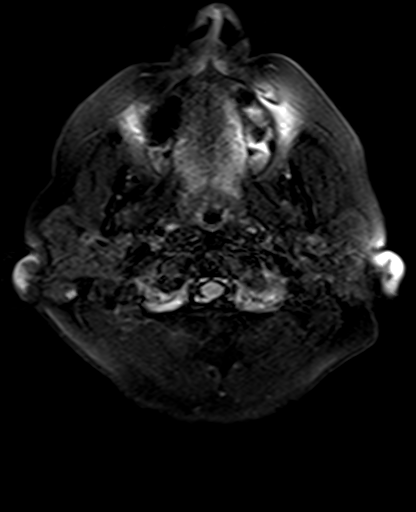
[im 25/25]
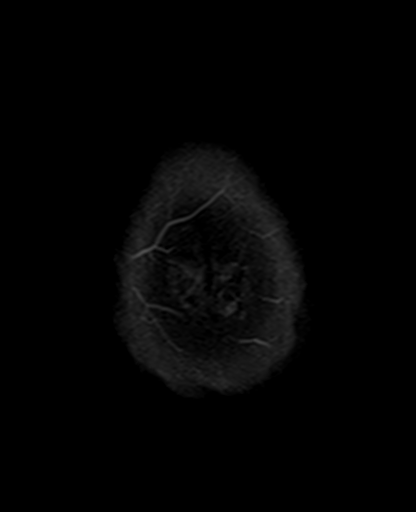

[Series 21: mag_images · axial · 3.0mm · 0.90mm/px · z∈[-15,+136]mm · 4 of 52 slices shown]
[im 1/52]
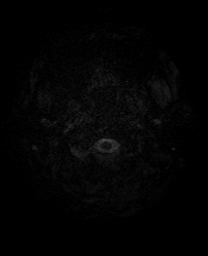
[im 18/52]
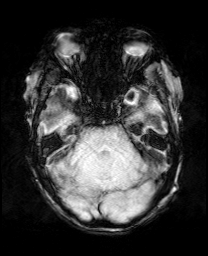
[im 35/52]
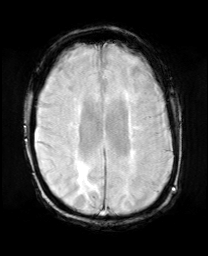
[im 52/52]
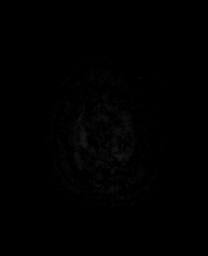

[Series 22: pha_images · axial · 3.0mm · 0.90mm/px · z∈[-15,+133]mm · 4 of 51 slices shown]
[im 1/51]
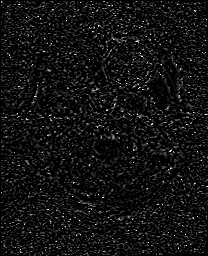
[im 17/51]
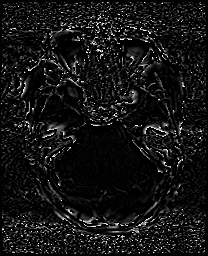
[im 34/51]
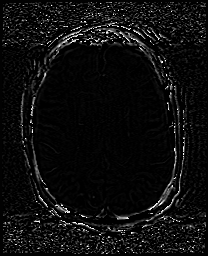
[im 51/51]
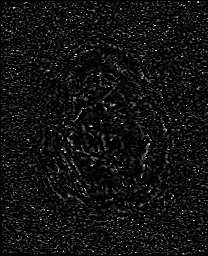

[Series 23: swi_images · axial · 3.0mm · 0.90mm/px · z∈[-15,+136]mm · 4 of 52 slices shown]
[im 1/52]
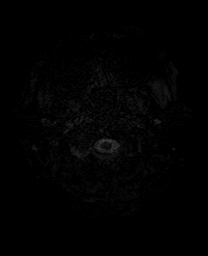
[im 18/52]
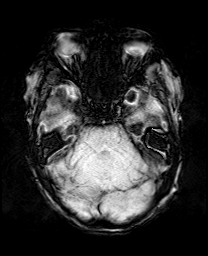
[im 35/52]
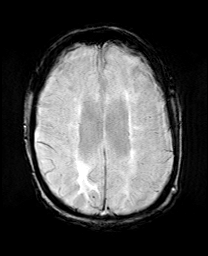
[im 52/52]
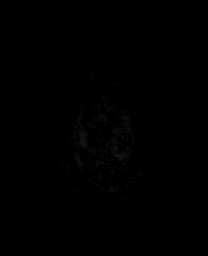

[Series 24: mip_images(sw) · axial · 24.0mm · 0.90mm/px · z∈[-5,+126]mm · 4 of 45 slices shown]
[im 1/45]
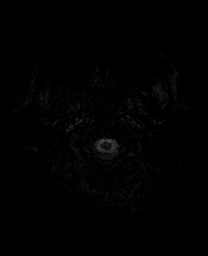
[im 15/45]
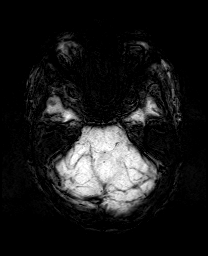
[im 30/45]
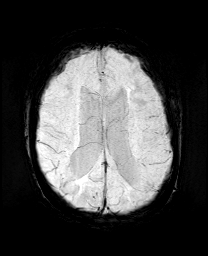
[im 45/45]
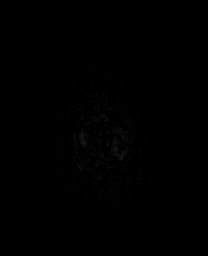

[Series 26: T2 · coronal · 5.0mm · 0.72mm/px · 2 of 30 slices shown (2 of 2)]
[im 1/30]
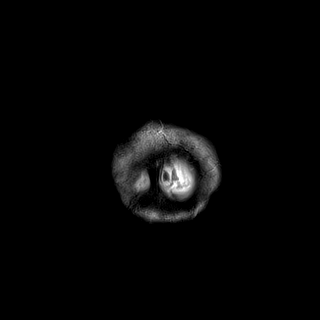
[im 30/30]
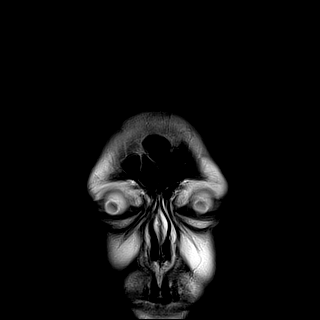

[44 of 48 positions shown; findings below may reference images not displayed]

FINDINGS: Brain: Interval development of acute infarct in the left medial
posterior frontal lobe in the ACA territory. Small areas of cortical
infarct in the left frontal lobe over the convexity. Small area of
acute infarct in the right anterior frontal cortex over the
convexity unchanged from the prior study.

Generalized atrophy. Chronic infarct right occipital parietal lobe
unchanged. Small infarct left parietal lobe unchanged. Chronic
infarct left frontal lobe. Mild white matter changes appear chronic.
Negative for hemorrhage or mass.

Vascular: Normal arterial flow voids

Skull and upper cervical spine: Negative

Sinuses/Orbits: Mucosal edema paranasal sinuses.  Negative orbit

Other: None
IMPRESSION: Interval development of acute infarct in the left medial frontal
parietal lobe in the ACA territory. New small areas of acute infarct
in the left frontal convexity. Recent infarct in the right anterior
frontal lobe unchanged from [DATE].

Chronic ischemic changes bilaterally.

## 2020-12-31 MED ORDER — WARFARIN SODIUM 5 MG PO TABS: 5.0000 mg | ORAL_TABLET | Freq: Once | ORAL | Status: DC

## 2020-12-31 MED ORDER — ENOXAPARIN SODIUM 80 MG/0.8ML ~~LOC~~ SOLN
80.0000 mg | Freq: Two times a day (BID) | SUBCUTANEOUS | Status: DC
  Filled 2020-12-31: qty 0.8

## 2020-12-31 MED ORDER — HEPARIN (PORCINE) 25000 UT/250ML-% IV SOLN
1200.0000 [IU]/h | INTRAVENOUS | Status: DC
  Administered 2020-12-31: 1200 [IU]/h via INTRAVENOUS
  Filled 2020-12-31 (×2): qty 250

## 2020-12-31 NOTE — Progress Notes (Signed)
[place] for heparin Indication: antiphospholipid antibody syndrome   Allergies  Allergen Reactions  . Penicillin G Rash   Patient Measurements: Height: 5\' 8"  (172.7 cm) Weight: 83.4 kg (183 lb 13.8 oz) IBW/kg (Calculated) : 63.9 Heparin weight: 82 kg  Vital Signs: Temp: 99.5 F (37.5 C) (01/29 1933) Temp Source: Oral (01/29 1542) BP: 140/63 (01/29 1933) Pulse Rate: 71 (01/29 1933)  Labs: Recent Labs    12/29/20 0543 12/30/20 0709 12/31/20 0635  HGB 11.2* 10.7*  --   HCT 33.4* 33.2*  --   PLT 85* 89*  --   APTT 49.3*  --   --   LABPROT  --  14.5 15.7*  INR  --  1.2 1.3*  CREATININE  --  1.77*  --     Estimated Creatinine Clearance: 34 mL/min (A) (by C-G formula based on SCr of 1.77 mg/dL (H)).   Medical History: Past Medical History:  Diagnosis Date  . Arthritis 04/02/1996  . Depression   . GERD (gastroesophageal reflux disease) 04/03/2019  . Hyperlipidemia   . Hypertension   . Renal insufficiency   . Stroke ([place])   . Thyroid disease     Assessment: 70 yo female admitted with CVA and thrombocytopenia 1/27 with new small acute infarcts 1/29 . Patient was on prescribed warfarin PTA antiphospholipid antibody syndrome, now held. INR subtherapeutic on admission. Pharmacy consulted for heparin on 1/29 given worsening CVA. Of note, baseline APTT elevated due to APLS.    Last received enoxaparin 1/28 22:24. ClCr ~34 ml/min.   Home Regimen: warfarin 2mg : Tu,Th, Sa,Su                              Warfarin 3mg : M,W,F   Goal of Therapy:  INR 2.5-3.5 when on warfarin Heparin level 0.3 - 0.5 units/mL Monitor platelets by anticoagulation protocol: Yes  Plan:  Start heparin 1200 units/hr, no bolus Monitor daily HL, CBC/plt Monitor for signs/symptoms of bleeding  F/u longterm anticoagulation plan   Benetta Spar, PharmD, BCPS, BCCP Clinical Pharmacist  Please check AMION for all [place] phone numbers After 10:00 PM,  call [place]

## 2020-12-31 NOTE — Evaluation (Signed)
Speech Language Pathology Assessment and Plan  Patient Details  Name: [name] MRN: [id number] Date of Birth: [birth year]  SLP Diagnosis: Other (comment);Cognitive Impairments (mild cognitive impairment at baseline.)     Today's Date: 12/31/2020 SLP Individual Time: 1300-1400 SLP Individual Time Calculation (min): 60 min   Hospital Problem: Active Problems:   Ischemic cerebrovascular accident (CVA) of frontal lobe [facility])  Past Medical History:  Past Medical History:  Diagnosis Date  . Arthritis 04/02/1996  . Depression   . GERD (gastroesophageal reflux disease) 04/03/2019  . Hyperlipidemia   . Hypertension   . Renal insufficiency   . Stroke ([place])   . Thyroid disease    Past Surgical History:  Past Surgical History:  Procedure Laterality Date  . CESAREAN SECTION    . CHOLECYSTECTOMY    . COLONOSCOPY  2012   repeat in 10 yrs  . KNEE ARTHROSCOPY W/ MENISCAL REPAIR Left     Assessment / Plan / Recommendation Clinical Impression   [name] a 70 year old right-handed female with history of CVA 2004 with residual left lateral visual field deficits, history of DVT and antiphospholipid antibody syndrome diagnosed 2004 followed by Dr. Otilio Miu maintained on chronic Coumadin, CKD stage III, hypertension, hyperlipidemia, hypothyroidism and mild cognitive impairments maintained on Aricept. Per chart review patient lives with spouse. Multilevel home bed and bath main level 5 steps to entry. Husband is limited physically to assist as personal care attendance and currently is admitted to [facility] as a renal [facility]. They do have a daughter here visiting from [place] at present. Patient reportedly independent prior to admission. Presented 12/26/2020 with acute onset of right leg weakness. Cranial CT scan showed no acute findings. Multiple old infarcts in the right occipital lobe at the right parietal vertex and at the left frontal convexity as  well as chronic small vessel ischemic changes in the white matter. Patient did not receive TPA. MRI showed small acute infarct within the anterior right frontal lobe. No hemorrhage or mass-effect. Distal occlusion of the right PCA likely chronic given old PCA territory infarction. Moderate stenosis of the left P4 segment. Admission chemistries unremarkable except BUN 30 creatinine 1.68 glucose 102, urine drug screen negative, hemoglobin 11.1, platelets 86,000, INR 1.2.. Echocardiogram with ejection fraction of 55 to 60% no wall motion abnormalities. Grade 1 diastolic dysfunction. Neurology follow-up patient currently remains on chronic Coumadin therapy with Lovenox till INR therapeutic with goal INR of 2.5-3.5 as well as low-dose aspirin. Follow-up oncology services for thrombocytopenia maintained on Coumadin therapy. Peripheral smear revealed no evidence of schistocytes and recommendations of routine follow-up CBC. Bilateral lower extremity Dopplers negative. Patient with multiple bouts of loose stool 3-4 for the past few weeks. No abdominal pain or nausea vomiting. Recommendations are for outpatient GI follow-up. Orders for basic stool studies include GI pathogen panel with results pending and currently on enteric precautions. Therapy evaluations completed recommendations inpatient rehab services secondary right side weakness and decreased functional ability as well as cognitive deficits.   Pt presents with cognitive linguistic skills WFL. Cognistat completed to assess cognition in the areas of memory, attention, problem solving/reasoning, an safety awareness. She scored in the average range for all subtests. Per H&P, patient does have mild cognitive impairment from previous CVA at baseline. She is oriented x4. She was able to identify 3 new physical limitations since recent hospitalization and she expressed concern for returning home with new deficits. Pt was able to recall 95% of medication  purposes when given  medication name. No SLP tx recommended at this time.    Skilled Therapeutic Interventions          SLP evaluation results completed. Pt in agreement with plan.  SLP Assessment  Patient does not need any further Speech Lanaguage Pathology Services             Pain Pain Assessment Pain Scale: Faces Faces Pain Scale: No hurt  Prior Functioning Cognitive/Linguistic Baseline: Within functional limits Type of Home: House  Lives With: Spouse Available Help at Discharge: Family;Personal care attendant Vocation: Retired  SLP Evaluation Cognition Overall Cognitive Status: Within Functional Limits for tasks assessed Arousal/Alertness: Awake/alert Orientation Level: Oriented X4 Sustained Attention: Appears intact Memory: Appears intact Problem Solving: Appears intact Safety/Judgment: Appears intact  Comprehension Auditory Comprehension Overall Auditory Comprehension: Appears within functional limits for tasks assessed Expression Expression Primary Mode of Expression: Verbal Verbal Expression Overall Verbal Expression: Appears within functional limits for tasks assessed Oral Motor Oral Motor/Sensory Function Overall Oral Motor/Sensory Function: Within functional limits  Care Tool Care Tool Cognition Expression of Ideas and Wants Expression of Ideas and Wants: Without difficulty (complex and basic) - expresses complex messages without difficulty and with speech that is clear and easy to understand   Understanding Verbal and Non-Verbal Content Understanding Verbal and Non-Verbal Content: Understands (complex and basic) - clear comprehension without cues or repetitions   Memory/Recall Ability *first 3 days only Memory/Recall Ability *first 3 days only: Location of own room;Staff names and faces;That he or she is in a hospital/hospital unit;Current season      Recommendations for other services: None   Discharge Criteria: Patient will be discharged from SLP  if patient refuses treatment 3 consecutive times without medical reason, if treatment goals not met, if there is a change in medical status, if patient makes no progress towards goals or if patient is discharged from hospital.  The above assessment, treatment plan, treatment alternatives and goals were discussed and mutually agreed upon: by patient  Gregary Signs A Jayla Mackie 12/31/2020, 1:52 PM

## 2020-12-31 NOTE — Progress Notes (Addendum)
Daughter Ramiro Harvest) called asking for update on pt and MRI results. Informed pt that MD Kirsteins will call her to give update. MD Kirsteins notified.  Sheela Stack, LPN

## 2020-12-31 NOTE — Progress Notes (Addendum)
[place] PHYSICAL MEDICINE & REHABILITATION PROGRESS NOTE   Subjective/Complaints:  Pt notes increased RUE weakness since yesterday  Some Right rib pain but no shoulder pain, no new numbness or tingling in RUE No change in leg weakness  Objective:   No results found. Recent Labs    12/29/20 0543 12/30/20 0709  WBC 4.4 4.5  HGB 11.2* 10.7*  HCT 33.4* 33.2*  PLT 85* 89*   Recent Labs    12/30/20 0709  NA 138  K 4.0  CL 103  CO2 24  GLUCOSE 129*  BUN 32*  CREATININE 1.77*  CALCIUM 9.7    Intake/Output Summary (Last 24 hours) at 12/31/2020 0650 Last data filed at 12/30/2020 1906 Gross per 24 hour  Intake 600 ml  Output --  Net 600 ml        Physical Exam: Vital Signs Blood pressure 127/76, pulse 71, temperature 98.2 F (36.8 C), resp. rate 15, height 5\' 8"  (1.727 m), weight 83.4 kg, SpO2 96 %.  General: No acute distress Mood and affect are appropriate Heart: Regular rate and rhythm no rubs murmurs or extra sounds Lungs: Clear to auscultation, breathing unlabored, no rales or wheezes Abdomen: Positive bowel sounds, soft nontender to palpation, nondistended Extremities: No clubbing, cyanosis, or edema Skin: No evidence of breakdown, no evidence of rash Neurologic: Cranial nerves II through XII intact, motor strength is 5/5 in Left  deltoid, bicep, tricep, grip, hip flexor, knee extensors, ankle dorsiflexor and plantar flexor RUE 3- delt bi tri grip RLE trace hip add o/w 0/5 Sensory exam normal sensation to light touch and proprioception in bilateral upper and lower extremities  Musculoskeletal: Full range of motion in all 4 extremities. No joint swelling   Assessment/Plan: 1. Functional deficits which require 3+ hours per day of interdisciplinary therapy in a comprehensive inpatient rehab setting.  Physiatrist is providing close team supervision and 24 hour management of active medical problems listed below.  Physiatrist and rehab team continue to  assess barriers to discharge/monitor patient progress toward functional and medical goals  Care Tool:  Bathing    Body parts bathed by patient: Right arm,Left arm,Chest,Abdomen,Front perineal area,Right upper leg,Left upper leg,Face   Body parts bathed by helper: Right lower leg,Left lower leg,Buttocks     Bathing assist Assist Level: Dependent - Patient 0% (stedy for STS to bathe buttocks)     Upper Body Dressing/Undressing Upper body dressing   What is the patient wearing?: Pull over shirt    Upper body assist Assist Level: Moderate Assistance - Patient 50 - 74%    Lower Body Dressing/Undressing Lower body dressing      What is the patient wearing?: Underwear/pull up,Pants     Lower body assist Assist for lower body dressing: Dependent - Patient 0% (stedy for STS)     Toileting Toileting    Toileting assist Assist for toileting: Maximal Assistance - Patient 25 - 49% (stedy)     Transfers Chair/bed transfer  Transfers assist     Chair/bed transfer assist level: Maximal Assistance - Patient 25 - 49%     Locomotion Ambulation   Ambulation assist   Ambulation activity did not occur: Safety/medical concerns  Assist level: 2 helpers Assistive device: Other (comment) Max distance:  (Stedy)   Walk 10 feet activity   Assist  Walk 10 feet activity did not occur: Safety/medical concerns  Assist level: 2 helpers     Walk 50 feet activity   Assist Walk 50 feet with 2 turns activity did  not occur: Safety/medical concerns         Walk 150 feet activity   Assist Walk 150 feet activity did not occur: N/A         Walk 10 feet on uneven surface  activity   Assist Walk 10 feet on uneven surfaces activity did not occur: N/A         Wheelchair     Assist        Wheelchair assist level: Moderate Assistance - Patient 50 - 74% Max wheelchair distance: 50    Wheelchair 50 feet with 2 turns activity    Assist        Assist  Level: Moderate Assistance - Patient 50 - 74%   Wheelchair 150 feet activity     Assist      Assist Level: Total Assistance - Patient < 25%   Blood pressure 127/76, pulse 71, temperature 98.2 F (36.8 C), resp. rate 15, height 5\' 8"  (1.727 m), weight 83.4 kg, SpO2 96 %.  Medical Problem List and Plan: 1.Right leg weaknesssecondary to small acute infarct within the anterior right frontal lobe. Multiple old infarcts and chronic small vessel ischemia.  Increased RUE weakness in setting of INR 1.2 but on therapeutic lovenoxwill repeat CT head to eval for CVA extension , ask neuro to review Spoke with daughter update her on new stroke.  She is helping care for her father who is status post renal transplant. Neuro recommended MRI of the cervical spine and brain.  MRI of the brain did show a left posterior frontal infarct, no hemorrhage Cervical spine MRI pending -patient may  shower -ELOS/Goals: 14-18d 2. Antithrombotics: -DVT/anticoagulation:Chronic Coumadin therapy. Continue Lovenox till INR therapeutic 2.5-3.5 -antiplatelet therapy: Aspirin 81 mg daily 3. Pain Management:Tylenol as needed 4. Mood:Aricept 10 mg daily, Lexapro 20 mg daily -antipsychotic agents: N/A 5. Neuropsych: This patientiscapable of making decisions on herown behalf. 6. Skin/Wound Care:Routine skin checks 7. Fluids/Electrolytes/Nutrition:Routine in and outs with follow-up chemistries 8. Antiphospholipid antibody syndrome/thrombocytopenia. Follow-up hematology services. Follow-up CBC. Continue Coumadin therapy 9. Hypertension. Cozaar 25 mg daily. Monitor with increased mobility Vitals:   12/30/20 0310 12/30/20 0412  BP: 121/73 119/70  Pulse: 68 67  Resp: 16 16  Temp: 98 F (36.7 C) 98.1 F (36.7 C)  SpO2: 100% 97%  controlled  10. Hypothyroidism. Synthroid 11. Hyperlipidemia. Lipitor 12. CKD stage III. Follow-up  chemistries 13. Diarrhea. Patient was treated for loose stools daily for the past few weeks. Plan outpatient follow-up GI. Basic stool studies including GI pathogen panel fecal lactoferrin TSH and free T4 pending.Maintain precautions until work-up completed. Continue Imodium as needed #14.  Irregular heart rhythm EKG shows occasional PVC no treatment needed    LOS: 2 days A FACE TO FACE EVALUATION WAS PERFORMED  Charlett Blake 12/31/2020, 6:50 AM

## 2020-12-31 NOTE — Progress Notes (Signed)
Occupational Therapy Session Note  Patient Details  Name: [name] MRN: [id number] Date of Birth: [birth year]  Today's Date: 12/31/2020 OT Individual Time: 1420-1517 OT Individual Time Calculation (min): 57 min    Short Term Goals: Week 1:  OT Short Term Goal 1 (Week 1): Pt will complete BSC/toilet transfer with mod A without lift. OT Short Term Goal 2 (Week 1): Pt will maintain static sitting balance to complete seated grooming unsupported with min A. OT Short Term Goal 3 (Week 1): Pt will don shirt with min A.  Skilled Therapeutic Interventions/Progress Updates:    Pt received on bed pan with NT present, requesting to get to toilet. Side roll > R with mod A to place LUE on bed rail + bend LLE. Side lying > sitting EOB with mod A to lift trunk + bring BLE off bed. Maintains static sitting with CGA/occasional min A 2/2 mod R lean. Attempted STS in stedy, pt unable to come into fully erect posture. SB transfer to and from [facility] with max A to facilitate pivots. Pt unable to void at [facility]. Sitting EOB > supine with mod A to bring BLE onto bed. Donned incontinent brief at bed level with total A. Total A + 2 to scoot up in bed (pt 0%). Reassessed RUE strength 2/2 increased weakness sp new acute infarct per epic review 1/29. RUE AAROM: ~40 degrees of R shoulder flexion. 3/5 grip strength, 2+/5 in shoulder flexion. Provided new therapy schedule for 1/30 and 1/31 as pt had incorrect schedule printed in room. Pt politely declining additional therapy 2/2 fatigue.   Pt left with semi-reclined in bed with LPN present, bed alarm engaged, call bell in reach, and all immediate needs met.    Therapy Documentation Precautions:  Precautions Precautions: Fall Precaution Comments: decreased L visual field Restrictions Weight Bearing Restrictions: No  General: General OT Amount of Missed Time: 18 Minutes OT Missed Treatment Reason: pt fatigue Pain: Pain Assessment Pain Scale: 0-10 Pain Score: 0-No  pain Faces Pain Scale: No hurt ADL: See Care Tool for more details.  Therapy/Group: Individual Therapy  Volanda Napoleon MS, OTR/L  12/31/2020, 3:51 PM

## 2020-12-31 NOTE — Progress Notes (Addendum)
[place] for Warfarin Dosing and Monitoring  Indication: antiphospholipid antibody syndrome   Allergies  Allergen Reactions  . Penicillin G Rash   Patient Measurements: Height: 5\' 8"  (172.7 cm) Weight: 83.4 kg (183 lb 13.8 oz) IBW/kg (Calculated) : 63.9  Vital Signs: Temp: 98.2 F (36.8 C) (01/29 0514) BP: 127/76 (01/29 0514) Pulse Rate: 71 (01/29 0514)  Labs: Recent Labs    12/29/20 0543 12/30/20 0709 12/31/20 0635  HGB 11.2* 10.7*  --   HCT 33.4* 33.2*  --   PLT 85* 89*  --   LABPROT  --  14.5 15.7*  INR  --  1.2 1.3*  CREATININE  --  1.77*  --     Estimated Creatinine Clearance: 34 mL/min (A) (by C-G formula based on SCr of 1.77 mg/dL (H)).   Medical History: Past Medical History:  Diagnosis Date  . Arthritis 04/02/1996  . Depression   . GERD (gastroesophageal reflux disease) 04/03/2019  . Hyperlipidemia   . Hypertension   . Renal insufficiency   . Stroke ([place])   . Thyroid disease     Assessment: 70 yo female admitted with CVA and thrombocytopenia. Patient was prescribed warfarin PTA antiphospholipid antibody syndrome. INR subtherapeutic on admission. Hematology has seen patient and discussed the importance of anticoagulation. Long-term coumadin recommended. Per neurology ok to restart anticoagulation (warfarin w/lovenox bridge) on 1/28.    Per Hem/Onc- When on Lovenox/heparin will need to follow anti-factor Xa as patient has a baseline increased PTT secondary to APL.    Home Regimen: warfarin 2mg : Tu,Th, Sa,Su                              Warfarin 3mg : M,W,F   INR today is subtherapeutic at 1.3.   Goal of Therapy:  INR 2.5-3.5 Anti-Xa level 0.6-1 units/ml 4hrs after LMWH dose given Monitor platelets by anticoagulation protocol: Yes  Plan:  Enoxaparin:  Continue Enoxaparin 1mg /kg every 12 hours (continue for at least 5 days and until INR at goal) Anti-Xa 4 hours after 4th LMWH dose  Warfarin:  Warfarin 5mg   x 1 Daily INR  Fara Olden, PharmD PGY-1 Pharmacy Resident 12/31/2020 8:25 AM Please see AMION for all pharmacy numbers  ADDENDUM: Reported significant right upper extremity weakness this morning, per Neurology team plan to hold [facility] until imaging completed to confirm no hemorrhagic transformation.  Plan: Hold warfarin today, hold lovenox AM dose.

## 2020-12-31 NOTE — Progress Notes (Signed)
Physical Therapy Session Note  Patient Details  Name: [name] MRN: [id number] Date of Birth: 11-11-1951  Today's Date: 12/31/2020 PT Individual Time: 0810-0915 PT Individual Time Calculation (min): 65 min   Short Term Goals: Week 1:  PT Short Term Goal 1 (Week 1): pt will transfer to [facility] with mod assist consistently PT Short Term Goal 2 (Week 1): Pt will perform bed mobility with min assist PT Short Term Goal 3 (Week 1): Pt will ambulate 13f with mod assist and BUE support on RW and +2 for safety PT Short Term Goal 4 (Week 1): Pt will propell WC 1037fwith supervision assist Skilled Therapeutic Interventions/Progress Updates:   Pt received supine in bed with NT assisting pt to complete hygiene following incontinent bowel movement, then Neuro consult being performed following decreased use of RUE overnight.  Pt then performed supine>sit with mod assist for RLE and trunk control. Lower body dressing sitting EOB with lateral weight shift R and L for PT pull pants to waist. Min assist required for placement of RUE. [facility] and min cues for sequecning. PT then performed VS assessment. BP137/76. RH 44. SpO2 100%. PT then manually assessed HR 81 with mulitple irregular PVC. When sitting in WC. Pt reports incontinent bladder and then bowel movement. Steady transfer to bed with mod assist and max cues for improved midline orientation and increased weight shift R. Sit.supine with min assist for RLE management. Rolling R and L x 3 to doff soiled clothes, additional incontinent bladder movement while supine requiring PT and RN to change linens. RN then notified and HR and assessed while PT in room. MD notified of irregular heart rate.  Pt left in room with call bell in reach and all needs met.        Therapy Documentation Precautions:  Precautions Precautions: Fall Precaution Comments: decreased L visual field Restrictions Weight Bearing Restrictions: No Vital  Signs:   Pain: denies   Therapy/Group: Individual Therapy  AuLorie Phenix/29/2022, 2:05 PM

## 2020-12-31 NOTE — Consult Note (Signed)
Neurology Consultation Reason for Consult: Right upper extremity weakness Requesting Physician: Dr. Letta Pate  CC: Right upper extremity weakness  History is obtained from: patient, chart review  HPI: [name] is a 70 y.o. female with a past medical history significant for recent small multifocal strokes (with right leg weakness), dementia, multiple falls, depression, hypertension, prior strokes in the right hemisphere (residual left hemianopia), antiphospholipid antibody syndrome and DVTs on Coumadin.  She recently presented on 1/24 as a code stroke to [facility].  Seen by Dr. Malen Gauze via teleneurology; too mild to treat based on an NIH of 1 and family declined tPA on discussion of risk/benefit in the setting of these mild symptoms.  Stroke etiology felt to be antiphospholipid syndrome in the setting of subtherapeutic INR (1.2).  She presented to rehab here on 12/29/2020 later in the day - yesterday morning on evaluation she did not appear to have any significant right upper extremity weakness, though perhaps had some subtle difficulty using her right upper extremity when walking with physical therapy.  This morning she is significantly weaker in the right upper and lower extremity for which neurology is reconsulted.   LKW: 12/30/20 08:00 tPA given?: No, out of time window IA performed?: No, no LVO indications Premorbid modified rankin scale: 1 - No significant disability. Able to carry out all usual activities, despite some symptoms. Patient with left hemianopia from stroke approximately 20 years ago, unable to drive but independently performs all ADLs.     ROS: A 14 point ROS was performed and is negative except as noted in the HPI.   Past Medical History:  Diagnosis Date  . Arthritis 04/02/1996  . Depression   . GERD (gastroesophageal reflux disease) 04/03/2019  . Hyperlipidemia   . Hypertension   . Renal insufficiency   . Stroke ([place])   . Thyroid disease    Family History  Problem  Relation Age of Onset  . Stroke Father   . Breast cancer Maternal Grandmother   . Kidney cancer Mother   . Cancer Mother   . Arthritis Brother    Social History:  reports that she has never smoked. She has never used smokeless tobacco. She reports current alcohol use of about 1.0 standard drink of alcohol per week. She reports that she does not use drugs.  Exam: Current vital signs: BP 127/76   Pulse 71   Temp 98.2 F (36.8 C)   Resp 15   Ht 5\' 8"  (1.727 m)   Wt 83.4 kg   SpO2 96%   BMI 27.96 kg/m  Vital signs in last 24 hours: Temp:  [97.6 F (36.4 C)-98.6 F (37 C)] 98.2 F (36.8 C) (01/29 0514) Pulse Rate:  [71-81] 71 (01/29 0514) Resp:  [15] 15 (01/28 2000) BP: (127-134)/(76-92) 127/76 (01/29 0514) SpO2:  [96 %-97 %] 96 % (01/29 0514)  Physical Exam  Constitutional: Appears well-developed and well-nourished.  Psych: Affect appropriate to situation Eyes: Normal conjunctiva, wears eyeglasses at baseline HENT: No OP obstrucion. Normocephalic, atraumatic.  MSK: no joint deformities.  Cardiovascular: Extremities warm, well perfused without edema.  Respiratory: Effort normal, non-labored breathing on room air GI: Soft.  No distension. There is no tenderness.  Skin: WDI  Neuro: Mental Status: Patient is awake, alert, oriented to person, place, month, year, and situation. She is able to give a clear and coherent history. There are no signs of aphasia or neglect present.  Cranial Nerves: II: Left hemianopia at baseline. Pupils are equal, round, and reactive to light  3 mm/brisk. III,IV, VI: EOMI, attends to all visual fields. No ptosis or diploplia noted.  V: Facial sensation is symmetric to temperature VII: Facial movement is symmetric resting and smiling (mild left droop, baseline) VIII: Hearing is intact to voice X: Palate elevates symmetrically, phonation intact XI: Shoulder shrug is weaker on the right. XII: Tongue protrudes midline without fasciculations.   Motor: Bulk is normal. Right upper extremity with antigravity movement 4/5 strength, weaker than left upper extremity but bilateral upper extremities without pronator drift. Left upper and lower extremities 5/5 strength without pronator drift. Right lower extremity 0/5 strength at the hip. Ankle trace inversion 1/5. Increased tone noted to the right lower extremity at the hip and knee. Tone is normal in the left upper and lower extremities.  Sensory: Sensation is symmetric to light touch and cool temperature in the arms and legs bilaterally. Deep Tendon Reflexes: 2+ brachioradialis and biceps reflexes bilaterally, 3+ in bilateral patellae, right ankle 4+ with clonus.  Plantars: Toes are downgoing on left mute on right. Cerebellar: FNF and HKS are intact in the left extremities, right lower extremity HKS unable to be assessed secondary to RLE weakness.  NIHSS total  Score breakdown:  1a Level of Conscious.: 0 1b LOC Questions: 0 1c LOC Commands: 0 2 Best Gaze: 0 3 Visual: 1; left hemianopia baseline deficit from prior stroke 4 Facial Palsy: 0 5a Motor Arm - left: 0 5b Motor Arm - Right: 1 6a Motor Leg - Left: 0 6b Motor Leg - Right: 3 7 Limb Ataxia: 0 8 Sensory: 0 9 Best Language: 0 10 Dysarthria: 0 11 Extinct. and Inatten.: 0 TOTAL: 4  I have reviewed labs in epic and the results pertinent to this consultation are: CBC    Component Value Date/Time   WBC 4.5 12/30/2020 0709   RBC 3.27 (L) 12/30/2020 0709   HGB 10.7 (L) 12/30/2020 0709   HGB 11.1 12/12/2020 1413   HCT 33.2 (L) 12/30/2020 0709   HCT 31.1 (L) 06/03/2020 1418   PLT 89 (L) 12/30/2020 0709   PLT 239 06/03/2020 1418   MCV 101.5 (H) 12/30/2020 0709   MCV 96 06/03/2020 1418   MCV 98 04/23/2012 2100   MCH 32.7 12/30/2020 0709   MCHC 32.2 12/30/2020 0709   RDW 12.2 12/30/2020 0709   RDW 11.9 06/03/2020 1418   RDW 12.7 04/23/2012 2100   LYMPHSABS 1.5 12/29/2020 0543   LYMPHSABS 1.6 06/03/2020 1418   MONOABS  0.4 12/29/2020 0543   EOSABS 0.1 12/29/2020 0543   EOSABS 0.1 06/03/2020 1418   BASOSABS 0.0 12/29/2020 0543   BASOSABS 0.1 06/03/2020 1418   CMP     Component Value Date/Time   NA 138 12/30/2020 0709   NA 141 02/10/2020 1005   NA 141 04/23/2012 2100   K 4.0 12/30/2020 0709   K 3.7 04/23/2012 2100   CL 103 12/30/2020 0709   CL 106 04/23/2012 2100   CO2 24 12/30/2020 0709   CO2 28 04/23/2012 2100   GLUCOSE 129 (H) 12/30/2020 0709   GLUCOSE 112 (H) 04/23/2012 2100   BUN 32 (H) 12/30/2020 0709   BUN 27 02/10/2020 1005   BUN 26 (H) 04/23/2012 2100   CREATININE 1.77 (H) 12/30/2020 0709   CREATININE 1.29 04/23/2012 2100   CALCIUM 9.7 12/30/2020 0709   CALCIUM 9.1 04/23/2012 2100   PROT 6.6 12/30/2020 0709   PROT 7.2 02/10/2020 1005   PROT 7.7 04/23/2012 2100   ALBUMIN 3.6 12/30/2020 0709   ALBUMIN 4.4  02/10/2020 1005   ALBUMIN 4.0 04/23/2012 2100   AST 28 12/30/2020 0709   AST 33 04/23/2012 2100   ALT 18 12/30/2020 0709   ALT 31 04/23/2012 2100   ALKPHOS 66 12/30/2020 0709   ALKPHOS 63 04/23/2012 2100   BILITOT 1.0 12/30/2020 0709   BILITOT 0.6 02/10/2020 1005   BILITOT 0.5 04/23/2012 2100   GFRNONAA 31 (L) 12/30/2020 0709   GFRNONAA 45 (L) 04/23/2012 2100   GFRAA 38 (L) 02/10/2020 1005   GFRAA 52 (L) 04/23/2012 2100   INR 1.3 Prothrombin time: 15.7 GFR estimated: 31 Anti Nuclear Antibody positive  Lab Results  Component Value Date   CHOL 158 12/27/2020   HDL 56 12/27/2020   LDLCALC 87 12/27/2020   TRIG 77 12/27/2020   CHOLHDL 2.8 12/27/2020    Lab Results  Component Value Date   HGBA1C 5.6 12/27/2020   I have reviewed the images obtained: 12/26/20 CT head: IMPRESSION: 1. No acute finding by CT. 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 Chronic small-vessel ischemic changes of the white matter. 2. ASPECTS is 10, allowing for the old infarction  1/24 MRI brain, MRA head and neck personally reviewed with concern for subtle changes in the left motor  cortex.  IMPRESSION: 1. Small acute infarct within the anterior right frontal lobe. No hemorrhage or mass effect. 2. Multiple old infarcts and chronic small vessel ischemia. 3. Distal occlusion of the right PCA is likely chronic given old PCA territory infarct. 4. Moderate stenosis of the left P4 segment. 5. Time-of-flight MRA of the neck without hemodynamically significant stenosis.  1/29 MRI brain and C-spine personally reviewed MRI C-spine: No actionable cord compression, neuroforaminal stenosis which does not explain hyperreflexia MRI brain: New acute infarct, no hemorrhagic conversion   Echocardiogram 12/27/20: - LVEF 55-60% with no wall motion abnormalities.  - Left ventricular impaired relaxation: consistent with grade 1 diastolic dysfunction - Mild left atrial dilation  Impression: 70 year old female with significant stroke risk factors (as above) who presented following acute right lower extremity weakness with new onset right arm weakness since admission first noticed on 12/30/20. She has a history of antiphospholipid antibody syndrome with a subtherapeutic INR of 1.2 on hospital arrival. CT head and MRI revealed an acute infarct within the right frontal lobe, inconsistent with current presentation, however on neurology review there were subtle foci of DWI intensity in the left frontal lobe which would correlate with patient's leg weakness. Given worsening weakness today, as well as myleopathic features with hyperreflexia throughout (worse on the right ankle likely due to recent stroke but 3+ in bilateral arms), will obtain MRI brain and C-spine to evaluate.   Recommendations: - MRI head and cervical spine completed as above  - Hold AC until imaging completed, if MRI can't be completed, will get CT to confirm no hemorrhagic transformation - Telemetry monitoring - Continue excellent rehabilitation  Lesleigh Noe MD-PhD [facility] [phone]  Addendum: On review  of MRI head and cervical spine, given that it has already been 3 days since the majority of the stroke seen on MRI and the additional stroke is very small, risks of starting anticoagulation are outweighed by the benefit.  Risks of intracerebral hemorrhage were reviewed with the patient and she expressed her understanding. -Continue every 4 hours neuro checks -Heparin low goal no bolus drip, management per pharmacist -Stroke team to follow

## 2020-12-31 NOTE — Progress Notes (Signed)
Pt c/o increased weakness to RUE different from baseline as of yesterday during therapy. Therapy this AM also notified nursing and MD Kirsteins. Pt denies any additional numbness/tingling, vision changes, pt (-) for facial droop, (-) for slurred speech. MD Kirsteins aware.  Sheela Stack, LPN

## 2021-01-01 DIAGNOSIS — I63422 Cerebral infarction due to embolism of left anterior cerebral artery: Secondary | ICD-10-CM | POA: Diagnosis not present

## 2021-01-01 DIAGNOSIS — I639 Cerebral infarction, unspecified: Secondary | ICD-10-CM | POA: Diagnosis not present

## 2021-01-01 LAB — CBC
HCT: 31.6 % — ABNORMAL LOW (ref 36.0–46.0)
Hemoglobin: 10.3 g/dL — ABNORMAL LOW (ref 12.0–15.0)
MCH: 33 pg (ref 26.0–34.0)
MCHC: 32.6 g/dL (ref 30.0–36.0)
MCV: 101.3 fL — ABNORMAL HIGH (ref 80.0–100.0)
Platelets: 94 10*3/uL — ABNORMAL LOW (ref 150–400)
RBC: 3.12 MIL/uL — ABNORMAL LOW (ref 3.87–5.11)
RDW: 12.3 % (ref 11.5–15.5)
WBC: 5.6 10*3/uL (ref 4.0–10.5)
nRBC: 0 % (ref 0.0–0.2)

## 2021-01-01 LAB — PROTIME-INR
INR: 1.4 — ABNORMAL HIGH (ref 0.8–1.2)
Prothrombin Time: 16.3 seconds — ABNORMAL HIGH (ref 11.4–15.2)

## 2021-01-01 LAB — C DIFFICILE (CDIFF) QUICK SCRN (NO PCR REFLEX)
C Diff antigen: NEGATIVE
C Diff interpretation: NOT DETECTED
C Diff toxin: NEGATIVE

## 2021-01-01 LAB — URINALYSIS, ROUTINE W REFLEX MICROSCOPIC
Bilirubin Urine: NEGATIVE
Glucose, UA: NEGATIVE mg/dL
Hgb urine dipstick: NEGATIVE
Ketones, ur: NEGATIVE mg/dL
Nitrite: NEGATIVE
Protein, ur: NEGATIVE mg/dL
Specific Gravity, Urine: 1.008 (ref 1.005–1.030)
pH: 5 (ref 5.0–8.0)

## 2021-01-01 LAB — HEPARIN LEVEL (UNFRACTIONATED)
Heparin Unfractionated: 0.49 IU/mL (ref 0.30–0.70)
Heparin Unfractionated: 0.73 IU/mL — ABNORMAL HIGH (ref 0.30–0.70)

## 2021-01-01 MED ORDER — TROLAMINE SALICYLATE 10 % EX CREA
TOPICAL_CREAM | Freq: Two times a day (BID) | CUTANEOUS | Status: DC | PRN
  Filled 2021-01-01: qty 85

## 2021-01-01 MED ORDER — APIXABAN 5 MG PO TABS
5.0000 mg | ORAL_TABLET | Freq: Two times a day (BID) | ORAL | Status: DC
  Administered 2021-01-01 – 2021-01-20 (×38): 5 mg via ORAL
  Filled 2021-01-01 (×39): qty 1

## 2021-01-01 MED ORDER — MUSCLE RUB 10-15 % EX CREA
TOPICAL_CREAM | Freq: Two times a day (BID) | CUTANEOUS | Status: DC | PRN
  Filled 2021-01-01: qty 85

## 2021-01-01 MED ORDER — BACITRACIN-NEOMYCIN-POLYMYXIN OINTMENT TUBE
TOPICAL_OINTMENT | Freq: Two times a day (BID) | CUTANEOUS | Status: DC
  Administered 2021-01-03 – 2021-01-16 (×2): 1 via TOPICAL
  Filled 2021-01-01: qty 14

## 2021-01-01 NOTE — Progress Notes (Signed)
MD Kirsteins notified of pt type 7 stools and urinary/bowel frequency.. New orders received. Sheela Stack, LPN

## 2021-01-01 NOTE — Progress Notes (Signed)
STROKE TEAM PROGRESS NOTE   HISTORY OF PRESENT ILLNESS (per record) [name] is a 70 y.o. female with a past medical history significant for recent small multifocal strokes (with right leg weakness), dementia, multiple falls, depression, hypertension, prior strokes in the right hemisphere (residual left hemianopia), antiphospholipid antibody syndrome and DVTs on Coumadin.  She recently presented on 1/24 as a code stroke to [facility].  Seen by Dr. Rory Percy via teleneurology; too mild to treat based on an NIH of 1 and family declined tPA on discussion of risk/benefit in the setting of these mild symptoms.  Stroke etiology felt to be antiphospholipid syndrome in the setting of subtherapeutic INR (1.2).  She presented to rehab here on 12/29/2020 later in the day - yesterday morning on evaluation she did not appear to have any significant right upper extremity weakness, though perhaps had some subtle difficulty using her right upper extremity when walking with physical therapy.  This morning she is significantly weaker in the right upper and lower extremity for which neurology is reconsulted. MRI performed 12/31/20 On review of MRI head and cervical spine, given that it has already been 3 days since the majority of the stroke seen on MRI and the additional stroke is very small, risks of starting anticoagulation are outweighed by the benefit.  Risks of intracerebral hemorrhage were reviewed with the patient and she expressed her understanding. -Continue every 4 hours neuro checks -Heparin low goal no bolus drip, management per pharmacist -Stroke team to follow  LKW: 12/30/20 08:00 tPA given?: No, out of time window IA performed?: No, no LVO indications Premorbid modified rankin scale: 1 - No significant disability. Able to carry out all usual activities, despite some symptoms. Patient with left hemianopia from stroke approximately 20 years ago, unable to drive but independently performs all ADLs.     INTERVAL HISTORY Patient presented with right-sided weakness especially involving the right leg but this got markedly worse overnight.    OBJECTIVE Vitals:   12/31/20 0514 12/31/20 1542 12/31/20 1933 01/01/21 0450  BP: 127/76 137/67 140/63 108/66  Pulse: 71 75 71 83  Resp:  17 18 18   Temp: 98.2 F (36.8 C) 98.2 F (36.8 C) 99.5 F (37.5 C) 98 F (36.7 C)  TempSrc:  Oral    SpO2: 96% 98% 97% 98%  Weight:      Height:        CBC:  Recent Labs  Lab 12/26/20 1953 12/27/20 0350 12/29/20 0543 12/30/20 0709 01/01/21 0306  WBC 6.3   < > 4.4 4.5 5.6  NEUTROABS 4.6  --  2.3  --   --   HGB 10.6*   < > 11.2* 10.7* 10.3*  HCT 31.9*   < > 33.4* 33.2* 31.6*  MCV 99.4   < > 98.5 101.5* 101.3*  PLT 86*   < > 85* 89* 94*   < > = values in this interval not displayed.    Basic Metabolic Panel:  Recent Labs  Lab 12/27/20 0350 12/30/20 0709 12/31/20 1213  NA 138 138  --   K 3.7 4.0  --   CL 105 103  --   CO2 24 24  --   GLUCOSE 114* 129*  --   BUN 26* 32*  --   CREATININE 1.46* 1.77*  --   CALCIUM 9.3 9.7  --   MG  --   --  2.0    Lipid Panel:     Component Value Date/Time   CHOL 158 12/27/2020  0350   CHOL 196 02/10/2020 1005   TRIG 77 12/27/2020 0350   HDL 56 12/27/2020 0350   HDL 64 02/10/2020 1005   CHOLHDL 2.8 12/27/2020 0350   VLDL 15 12/27/2020 0350   LDLCALC 87 12/27/2020 0350   LDLCALC 111 (H) 02/10/2020 1005   HgbA1c:  Lab Results  Component Value Date   HGBA1C 5.6 12/27/2020   Urine Drug Screen:     Component Value Date/Time   LABOPIA NONE DETECTED 12/26/2020 2232   COCAINSCRNUR NONE DETECTED 12/26/2020 2232   LABBENZ NONE DETECTED 12/26/2020 2232   AMPHETMU NONE DETECTED 12/26/2020 2232   THCU NONE DETECTED 12/26/2020 2232   LABBARB NONE DETECTED 12/26/2020 2232    Alcohol Level     Component Value Date/Time   ETH <10 12/26/2020 1953    IMAGING  MR BRAIN WO CONTRAST 12/31/2020 IMPRESSION:  Interval development of acute infarct  in the left medial frontal parietal lobe in the ACA territory. New small areas of acute infarct in the left frontal convexity. Recent infarct in the right anterior frontal lobe unchanged from 12/26/2020. Chronic ischemic changes bilaterally.   MR CERVICAL SPINE WO CONTRAST 12/31/2020 IMPRESSION:  Mild right foraminal narrowing C3-4 and C4-5 due to spurring 3 mm retrolisthesis C5-6. Mild spinal stenosis and mild to moderate foraminal stenosis bilaterally due to spurring.   MRA HEAD AND NECK 12/26/20 IMPRESSION: 1. Small acute infarct within the anterior right frontal lobe. No hemorrhage or mass effect. 2. Multiple old infarcts and chronic small vessel ischemia. 3. Distal occlusion of the right PCA is likely chronic given old PCA territory infarct. 4. Moderate stenosis of the left P4 segment. 5. Time-of-flight MRA of the neck without hemodynamically significant stenosis.  CT HEAD CODE STROKE WO CONTRAST 12/26/20  IMPRESSION: 1. No acute finding by CT. 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 Chronic small-vessel ischemic changes of the white matter. 2. ASPECTS is 10, allowing for the old infarction.  Transthoracic Echocardiogram  12/27/20 IMPRESSIONS  1. Left ventricular ejection fraction, by estimation, is 55 to 60%. The  left ventricle has normal function. The left ventricle has no regional  wall motion abnormalities. Left ventricular diastolic parameters are  consistent with Grade I diastolic  dysfunction (impaired relaxation).  2. Right ventricular systolic function is normal. The right ventricular  size is normal.  3. Left atrial size was mildly dilated.  4. The mitral valve is grossly normal. Trivial mitral valve  regurgitation.  5. The aortic valve was not well visualized. Aortic valve regurgitation  is trivial.   ECG - SR rate 75 BPM. (See cardiology reading for complete details)  PHYSICAL EXAM Blood pressure 108/66, pulse 83, temperature 98 F (36.7 C), resp. rate  18, height 5\' 8"  (1.727 m), weight 83.4 kg, SpO2 98 %. GENERAL: She is sitting up in a chair working with physical therapy.  HEENT:  Normal  ABDOMEN: soft  EXTREMITIES: Mild ankle edema   BACK: normal  SKIN: Normal by inspection.    MENTAL STATUS: Alert and oriented. Speech, language and cognition are generally intact. Judgment and insight normal.   CRANIAL NERVES: Pupils are equal, round and reactive to light and accomodation; extra ocular movements are full, there is no significant nystagmus; visual fields are full; upper and lower facial muscles are normal in strength and symmetric, there is no flattening of the nasolabial folds; tongue is midline; uvula is midline; shoulder elevation is normal.  MOTOR:  Right upper extremity and right lower extremity 1/2. Left side 4  +/  5.  COORDINATION: Left finger to nose is normal, right finger to nose is normal, No rest tremor; no intention tremor; no postural tremor; no bradykinesia.  REFLEXES: Deep tendon reflexes are symmetrical and normal. Plantar reflexes are flexor bilaterally.   SENSATION: Normal to light touch, temperature, and pain.          ASSESSMENT/PLAN Ms. [name] is a 70 y.o. female with history of recent small multifocal strokes (with right leg weakness), dementia, multiple falls, depression, renal insufficiency, hypertension, prior strokes in the right hemisphere (residual left hemianopia), antiphospholipid antibody syndrome and DVTs on Coumadin who presented to [facility] 1/24 and was seen by Dr Rory Percy via teleneurology for right leg weakness. INR 1.2. Sxs were mild. Pt admitted to [facility] 1/27 -> pt developed increased weakness on right and was seen by Dr Curly [place]. Mri revealed new small stroke -> heparin IV -> admitted to acute bed. She did not receive IV t-PA due to recent stroke  Stroke: Interval development of acute infarct in the left medial frontal parietal lobe in the ACA territory. New small areas of acute  infarct in the left frontal convexity - embolic - antiphospholipid antibody syndrome  Resultant   Severe right hemiparesis  Code Stroke CT Head - 12/26/20 - No acute finding by CT. 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 Chronic small-vessel ischemic changes of the white matter. ASPECTS is 10, allowing for the old infarction.  CT head - not ordered  MRI head - Interval development of acute infarct in the left medial frontal parietal lobe in the ACA territory. New small areas of acute infarct in the left frontal convexity. Recent infarct in the right anterior frontal lobe unchanged from 12/26/2020. Chronic ischemic changes bilaterally.  MRA H&N - 12/26/20 - Distal occlusion of the right PCA is likely chronic given old PCA territory infarct. Moderate stenosis of the left P4 segment. Time-of-flight MRA of the neck without hemodynamically significant stenosis.  CTA H&N - not ordered  CT Perfusion - not ordered  Carotid Doppler - MRA neck ordered - carotid dopplers not indicated  2D Echo - 12/27/20 - EF 55 to 60%.  No cardiac source of emboli identified.   Lacey Jensen Virus 2 - 12/26/20 - negative  LDL - 87  HgbA1c - 5.6  UDS - 12/26/20 - negative  VTE prophylaxis - heparin IV Diet  Diet Order            Diet Heart Room service appropriate? Yes; Fluid consistency: Thin  Diet effective now                  warfarin daily prior to admission, now on aspirin 81 mg daily and heparin IV  Patient will be counseled to be compliant with her antithrombotic medications  Ongoing aggressive stroke risk factor management  Therapy recommendations:  pending  Disposition:  Pending  Hypertension  Home BP meds: losartan 25 mg daily  Current BP meds: losartan 25 mg daily  BP mildly low at times - 108/86 - consider stopping cozaar for now in setting of acute stroke . Permissive hypertension (OK if < 220/120) but gradually normalize in 5-7 days  . Long-term BP goal  normotensive  Hyperlipidemia  Home Lipid lowering medication: Lipitor 10 mg daily   LDL 87, goal < 70  Current lipid lowering medication: Lipitor 80 mg daily   Continue statin at discharge  Other Stroke Risk Factors  Advanced age  ETOH use, advised to drink no more than 1 alcoholic  beverage per day.  Overweight, Body mass index is 27.96 kg/m., recommend weight loss, diet and exercise as appropriate   Family hx stroke (father)   Hx stroke/TIA  Antiphospholipid antibody syndrome  Other Active Problems, Findings, Recommendations and/or Plan  Code status - Full code   Warfarin PTA - currently on hold -  INR - 1.2->1.3->1.4   Anemia - Hgb - 11.2->10.7->10.3  Thrombocytopenia - Plts - 85->89->94 CKD - stage 3b - creatinine - 1.46->1.77 Hypothyroid - TSH - 2.920->11.289 - levothyroxine (SYNTHROID) 75 MCG tablet -> increased to 100 mcg daily 12/30/20   Hospital day # 3  I had a lengthy discussion with the pharmacy and the patient. She has had 2 ischemic events in 1 week. Her INR has been subtherapeutic for several months. The latest event occurred while she has been on aspirin and the heparin IV.  I recommended temporary apixaban for consistent anticoagulation. I will defer long-term anticoagulation to Dr.Sethi.   To contact Stroke Continuity provider, please refer to [email or web address] After hours, contact General Neurology

## 2021-01-01 NOTE — Progress Notes (Signed)
[place] for heparin Indication: antiphospholipid antibody syndrome   Allergies  Allergen Reactions  . Penicillin G Rash   Patient Measurements: Height: 5\' 8"  (172.7 cm) Weight: 83.4 kg (183 lb 13.8 oz) IBW/kg (Calculated) : 63.9 Heparin weight: 82 kg  Vital Signs: Temp: 99.5 F (37.5 C) (01/29 1933) BP: 140/63 (01/29 1933) Pulse Rate: 71 (01/29 1933)  Labs: Recent Labs    12/29/20 0543 12/30/20 0709 12/31/20 0635 01/01/21 0306  HGB 11.2* 10.7*  --  10.3*  HCT 33.4* 33.2*  --  31.6*  PLT 85* 89*  --  94*  APTT 49.3*  --   --   --   LABPROT  --  14.5 15.7* 16.3*  INR  --  1.2 1.3* 1.4*  HEPARINUNFRC  --   --   --  0.49  CREATININE  --  1.77*  --   --     Estimated Creatinine Clearance: 34 mL/min (A) (by C-G formula based on SCr of 1.77 mg/dL (H)).   Medical History: Past Medical History:  Diagnosis Date  . Arthritis 04/02/1996  . Depression   . GERD (gastroesophageal reflux disease) 04/03/2019  . Hyperlipidemia   . Hypertension   . Renal insufficiency   . Stroke ([place])   . Thyroid disease     Assessment: 70 yo female admitted with CVA and thrombocytopenia 1/27 with new small acute infarcts 1/29 . Patient was on prescribed warfarin PTA antiphospholipid antibody syndrome, now held. INR subtherapeutic on admission. Pharmacy consulted for heparin on 1/29 given worsening CVA. Of note, baseline APTT elevated due to APLS.     1/30 AM update:  Initial heparin level therapeutic    Goal of Therapy:  Heparin level 0.3 - 0.5 units/mL Monitor platelets by anticoagulation protocol: Yes  Plan:  Cont heparin at 1200 units/hr 1200 heparin level F/u longterm anticoagulation plan   Narda Bonds, PharmD, BCPS Clinical Pharmacist Phone: [phone]

## 2021-01-01 NOTE — Progress Notes (Signed)
K Pad applied to right shoulder. No complications noted.  Sheela Stack, LPN

## 2021-01-01 NOTE — Progress Notes (Signed)
Heparin infusion stopped.  Sheela Stack, LPN

## 2021-01-01 NOTE — Progress Notes (Addendum)
Physical Therapy Session Note  Patient Details  Name: ANNAIS CRAFTS MRN: [id number] Date of Birth: [birth year]  Today's Date: 01/01/2021 PT Individual Time: 0272-5366 PT Individual Time Calculation (min): 60 min   Short Term Goals: Week 1:  PT Short Term Goal 1 (Week 1): pt will transfer to [facility] with mod assist consistently PT Short Term Goal 2 (Week 1): Pt will perform bed mobility with min assist PT Short Term Goal 3 (Week 1): Pt will ambulate 58ft with mod assist and BUE support on RW and +2 for safety PT Short Term Goal 4 (Week 1): Pt will propell WC 157ft with supervision assist  Skilled Therapeutic Interventions/Progress Updates:   Order from MD for re-assessment due to new CVA 12/31/20.  Pt did not leave unit. Pt resting in bed.  She stated that R shoulder was sore, but did not rate it.  She reported that the most recent CVA affected her RUE only, and was already improving.  She was pleased to demonstrate that she already had R shoulder and elbow flexion against gravity. RLE demonstrated hip and knee extension synergy ; minimal hip and knee flexion synergy noted. No R ankle DF noted.  Neuromuscular re-education via set -up and multimodal cues for R hip AROM/PROM and bil lower trunk rotation.  Rolling L/R  with mod assist in flat bed without rails. Supine> sit to R with max assist.  Pt sat EOB with L hand support x 5 minutes with stand by assistance and cues to lean L as she intermittently drifted R.  Slide board transfer bed> w/c to L with mod assist, with cues for technique.    Neurologist entered room and briefly examined pt in wc.  In addition to RLE weakness, he noted LLE weakness/drift due to decreased L hip motor control.  Pt informed PT that she needed to use toilet.    +2 Stedy transfer to toilet.  +2 needed for sit>< stand and for hygiene and clothing mgt in standing. Pt tolerated standing x <1 minute due to L knee pain; multiple sit>< stands needed for peri care due to bowel  incontinence.  Pt reported L knee surgery years ago, and that it sometimes buckles.   Pt incontinent of bowel; continent of bladder.    Pt stated that she would like to sit in recliner at end of session. Use of Stedy+2 for transfer to recliner.   Seat belt alarm set and needs left at hand.  Bil LEs elevated and pillow placed under R UE.  Due to time constraints, PT unable to perform further assessment of pt's motor control in light of most recent CVA.        Therapy Documentation Precautions:  Precautions Precautions: Fall Precaution Comments: decreased L visual field Restrictions Weight Bearing Restrictions: No          Therapy/Group: Individual Therapy  Ansley Mangiapane 01/01/2021, 11:10 AM

## 2021-01-01 NOTE — Progress Notes (Signed)
[place] PHYSICAL MEDICINE & REHABILITATION PROGRESS NOTE   Subjective/Complaints:  Appreciate neuro note , new left ACA infarct  With increased R sided weakness, now on IV heparin, C spine MRI shows mild /mod degenerative changes but does not explain increased weakness  RIght shoulder sore last noc but no pain with elevating arm   ROS- neg CP, SOB, N/V/D  Objective:   MR BRAIN WO CONTRAST  Result Date: 12/31/2020 CLINICAL DATA:  Stroke. Anti phospholipid antibody syndrome. On Coumadin. EXAM: MRI HEAD WITHOUT CONTRAST TECHNIQUE: Multiplanar, multiecho pulse sequences of the brain and surrounding structures were obtained without intravenous contrast. COMPARISON:  MRI head 12/26/2020 FINDINGS: Brain: Interval development of acute infarct in the left medial posterior frontal lobe in the ACA territory. Small areas of cortical infarct in the left frontal lobe over the convexity. Small area of acute infarct in the right anterior frontal cortex over the convexity unchanged from the prior study. Generalized atrophy. Chronic infarct right occipital parietal lobe unchanged. Small infarct left parietal lobe unchanged. Chronic infarct left frontal lobe. Mild white matter changes appear chronic. Negative for hemorrhage or mass. Vascular: Normal arterial flow voids Skull and upper cervical spine: Negative Sinuses/Orbits: Mucosal edema paranasal sinuses.  Negative orbit Other: None IMPRESSION: Interval development of acute infarct in the left medial frontal parietal lobe in the ACA territory. New small areas of acute infarct in the left frontal convexity. Recent infarct in the right anterior frontal lobe unchanged from 12/26/2020. Chronic ischemic changes bilaterally. Electronically Signed   By: Franchot Gallo M.D.   On: 12/31/2020 10:40   MR CERVICAL SPINE WO CONTRAST  Result Date: 12/31/2020 CLINICAL DATA:  Acute myelopathy. Anti phospholipid antibody syndrome. On Coumadin. EXAM: MRI CERVICAL SPINE  WITHOUT CONTRAST TECHNIQUE: Multiplanar, multisequence MR imaging of the cervical spine was performed. No intravenous contrast was administered. COMPARISON:  Cervical spine radiograph 04/15/2005 FINDINGS: Alignment: 3 mm retrolisthesis C5-6. Vertebrae: Negative for fracture or mass Cord: Normal signal and morphology Posterior Fossa, vertebral arteries, paraspinal tissues: Negative Disc levels: C2-3: Negative C3-4: Moderate right foraminal encroachment due to facet hypertrophy. Spinal canal normal in size. C4-5: Mild disc degeneration and spurring. Mild right foraminal narrowing due to uncinate spurring and facet hypertrophy. Left foramen patent. C5-6: 3 mm retrolisthesis. Disc degeneration and facet degeneration. Mild to moderate foraminal encroachment bilaterally. Mild spinal stenosis. C6-7: Small central disc protrusion.  Negative for stenosis C7-T1: Negative IMPRESSION: Mild right foraminal narrowing C3-4 and C4-5 due to spurring 3 mm retrolisthesis C5-6. Mild spinal stenosis and mild to moderate foraminal stenosis bilaterally due to spurring. Electronically Signed   By: Franchot Gallo M.D.   On: 12/31/2020 11:07   Recent Labs    12/30/20 0709 01/01/21 0306  WBC 4.5 5.6  HGB 10.7* 10.3*  HCT 33.2* 31.6*  PLT 89* 94*   Recent Labs    12/30/20 0709  NA 138  K 4.0  CL 103  CO2 24  GLUCOSE 129*  BUN 32*  CREATININE 1.77*  CALCIUM 9.7    Intake/Output Summary (Last 24 hours) at 01/01/2021 0721 Last data filed at 12/31/2020 1850 Gross per 24 hour  Intake 470 ml  Output -  Net 470 ml        Physical Exam: Vital Signs Blood pressure 108/66, pulse 83, temperature 98 F (36.7 C), resp. rate 18, height 5\' 8"  (1.727 m), weight 83.4 kg, SpO2 98 %.  General: No acute distress Mood and affect are appropriate Heart: Regular rate and rhythm no rubs murmurs or  extra sounds Lungs: Clear to auscultation, breathing unlabored, no rales or wheezes Abdomen: Positive bowel sounds, soft nontender  to palpation, nondistended Extremities: No clubbing, cyanosis, or edema Skin: No evidence of breakdown, no evidence of rash   Neurologic: Cranial nerves II through XII intact, motor strength is 5/5 in Left  deltoid, bicep, tricep, grip, hip flexor, knee extensors, ankle dorsiflexor and plantar flexor RUE 3- delt bi tri grip RLE trace hip add o/w 0/5 Sensory exam normal sensation to light touch and proprioception in bilateral upper and lower extremities  Musculoskeletal: Full range of motion in all 4 extremities. No joint swelling, neg impingement sign    Assessment/Plan: 1. Functional deficits which require 3+ hours per day of interdisciplinary therapy in a comprehensive inpatient rehab setting.  Physiatrist is providing close team supervision and 24 hour management of active medical problems listed below.  Physiatrist and rehab team continue to assess barriers to discharge/monitor patient progress toward functional and medical goals  Care Tool:  Bathing    Body parts bathed by patient: Right arm,Left arm,Chest,Abdomen,Front perineal area,Right upper leg,Left upper leg,Face   Body parts bathed by helper: Right lower leg,Left lower leg,Buttocks     Bathing assist Assist Level: Dependent - Patient 0% (stedy for STS to bathe buttocks)     Upper Body Dressing/Undressing Upper body dressing   What is the patient wearing?: Pull over shirt    Upper body assist Assist Level: Moderate Assistance - Patient 50 - 74%    Lower Body Dressing/Undressing Lower body dressing      What is the patient wearing?: Incontinence brief     Lower body assist Assist for lower body dressing: Total Assistance - Patient < 25%     Toileting Toileting    Toileting assist Assist for toileting: Maximal Assistance - Patient 25 - 49% (stedy)     Transfers Chair/bed transfer  Transfers assist     Chair/bed transfer assist level: Maximal Assistance - Patient 25 - 49%      Locomotion Ambulation   Ambulation assist   Ambulation activity did not occur: Safety/medical concerns  Assist level: 2 helpers Assistive device: Other (comment) Max distance:  (Stedy)   Walk 10 feet activity   Assist  Walk 10 feet activity did not occur: Safety/medical concerns  Assist level: 2 helpers     Walk 50 feet activity   Assist Walk 50 feet with 2 turns activity did not occur: Safety/medical concerns         Walk 150 feet activity   Assist Walk 150 feet activity did not occur: N/A         Walk 10 feet on uneven surface  activity   Assist Walk 10 feet on uneven surfaces activity did not occur: N/A         Wheelchair     Assist        Wheelchair assist level: Moderate Assistance - Patient 50 - 74% Max wheelchair distance: 50    Wheelchair 50 feet with 2 turns activity    Assist        Assist Level: Moderate Assistance - Patient 50 - 74%   Wheelchair 150 feet activity     Assist      Assist Level: Total Assistance - Patient < 25%   Blood pressure 108/66, pulse 83, temperature 98 F (36.7 C), resp. rate 18, height 5\' 8"  (1.727 m), weight 83.4 kg, SpO2 98 %.  Medical Problem List and Plan: 1.Right leg weaknesssecondary to small acute infarct  within the anterior right frontal lobe. Multiple old infarcts and chronic small vessel ischemia. New RIght ACA infarct on IV hep per neuro , will be seen by stroke team in am and hopefully can transition to warfarin  -patient may  shower -ELOS/Goals: 14-18d 2. Antithrombotics: -DVT/anticoagulation:Chronic Coumadin therapy. Continue Lovenox till INR therapeutic 2.5-3.5 -antiplatelet therapy: Aspirin 81 mg daily 3. Pain Management:Tylenol as needed shoulder pain on RIght add Kpad , sportscream 4. Mood:Aricept 10 mg daily, Lexapro 20 mg daily -antipsychotic agents: N/A 5. Neuropsych: This patientiscapable of making  decisions on herown behalf. 6. Skin/Wound Care:Routine skin checks 7. Fluids/Electrolytes/Nutrition:Routine in and outs with follow-up chemistries 8. Antiphospholipid antibody syndrome/thrombocytopenia. Follow-up hematology services. Follow-up CBC. Continue Coumadin therapy 9. Hypertension. Cozaar 25 mg daily. Monitor with increased mobility Vitals:   12/30/20 0310 12/30/20 0412  BP: 121/73 119/70  Pulse: 68 67  Resp: 16 16  Temp: 98 F (36.7 C) 98.1 F (36.7 C)  SpO2: 100% 97%  controlled  10. Hypothyroidism. Synthroid 11. Hyperlipidemia. Lipitor 12. CKD stage III. Follow-up chemistries 13. Diarrhea. Patient was treated for loose stools daily for the past few weeks. Plan outpatient follow-up GI. Basic stool studies including GI pathogen panel fecal lactoferrin TSH and free T4 pending.Maintain precautions until work-up completed. Continue Imodium as needed #14.  Irregular heart rhythm EKG shows occasional PVC no treatment needed    LOS: 3 days A FACE TO FACE EVALUATION WAS PERFORMED  Charlett Blake 01/01/2021, 7:21 AM

## 2021-01-01 NOTE — Progress Notes (Signed)
[place] for heparin Indication: antiphospholipid antibody syndrome   Allergies  Allergen Reactions  . Penicillin G Rash   Patient Measurements: Height: 5\' 8"  (172.7 cm) Weight: 83.4 kg (183 lb 13.8 oz) IBW/kg (Calculated) : 63.9 Heparin weight: 82 kg  Vital Signs: Temp: 98 F (36.7 C) (01/30 0450) BP: 108/66 (01/30 0450) Pulse Rate: 83 (01/30 0450)  Labs: Recent Labs    12/30/20 0709 12/31/20 0635 01/01/21 0306  HGB 10.7*  --  10.3*  HCT 33.2*  --  31.6*  PLT 89*  --  94*  LABPROT 14.5 15.7* 16.3*  INR 1.2 1.3* 1.4*  HEPARINUNFRC  --   --  0.49  CREATININE 1.77*  --   --     Estimated Creatinine Clearance: 34 mL/min (A) (by C-G formula based on SCr of 1.77 mg/dL (H)).   Medical History: Past Medical History:  Diagnosis Date  . Arthritis 04/02/1996  . Depression   . GERD (gastroesophageal reflux disease) 04/03/2019  . Hyperlipidemia   . Hypertension   . Renal insufficiency   . Stroke ([place])   . Thyroid disease     Assessment: 70 yo female admitted with CVA and thrombocytopenia 1/27 with new small acute infarcts 1/29 . Patient was on prescribed warfarin PTA antiphospholipid antibody syndrome, history of CVA and DVT, now held. INR subtherapeutic on admission. Pharmacy consulted for heparin on 1/29 given worsening CVA. Of note, baseline APTT elevated due to APLS.  Last received enoxaparin 1/28 22:24. ClCr ~34 ml/min.   Home Regimen: warfarin 2mg : Tu,Th, Sa,Su                              Warfarin 3mg : M,W,F   Confirmatory heparin level supratherapeutic at 0.73. Per RN, no reported bleeding. Hgb/platelets stable. Patient to be seen by stroke team today.  Heparin infusion stopped per Dr. Merlene Laughter, started Eliquis.   Discussed with Dr. Merlene Laughter potentially higher risk of thromboembolism with DOACs vs warfarin given APS. Patient's INR subtherapeutic x2 in past month prior to admission, potentially noncompliant. Plans to get  Dr. Clydene Fake input tomorrow on anticoagulation plan.   Goal of Therapy:  INR 2.5-3.5 when on warfarin Heparin level 0.3 - 0.5 units/mL when on heparin Monitor platelets by anticoagulation protocol: Yes  Plan:  Heparin infusion stopped and started Eliquis 5mg  BID per Neuro Follow up anticoagulation plan per Neuro tomorrow  Fara Olden, PharmD PGY-1 Pharmacy Resident 01/01/2021 1:36 PM Please see AMION for all pharmacy numbers

## 2021-01-02 DIAGNOSIS — I639 Cerebral infarction, unspecified: Secondary | ICD-10-CM | POA: Diagnosis not present

## 2021-01-02 DIAGNOSIS — I63422 Cerebral infarction due to embolism of left anterior cerebral artery: Secondary | ICD-10-CM | POA: Diagnosis not present

## 2021-01-02 LAB — URINE CULTURE

## 2021-01-02 LAB — CBC
HCT: 30.2 % — ABNORMAL LOW (ref 36.0–46.0)
Hemoglobin: 10.2 g/dL — ABNORMAL LOW (ref 12.0–15.0)
MCH: 33.9 pg (ref 26.0–34.0)
MCHC: 33.8 g/dL (ref 30.0–36.0)
MCV: 100.3 fL — ABNORMAL HIGH (ref 80.0–100.0)
Platelets: 100 10*3/uL — ABNORMAL LOW (ref 150–400)
RBC: 3.01 MIL/uL — ABNORMAL LOW (ref 3.87–5.11)
RDW: 12.5 % (ref 11.5–15.5)
WBC: 5.5 10*3/uL (ref 4.0–10.5)
nRBC: 0 % (ref 0.0–0.2)

## 2021-01-02 LAB — PROTIME-INR
INR: 1.4 — ABNORMAL HIGH (ref 0.8–1.2)
Prothrombin Time: 16.6 seconds — ABNORMAL HIGH (ref 11.4–15.2)

## 2021-01-02 MED ORDER — BLOOD PRESSURE CONTROL BOOK
Freq: Once | Status: AC
  Filled 2021-01-02: qty 1

## 2021-01-02 NOTE — Progress Notes (Signed)
Patient ID: Delories Mauri [facility], female   DOB: [birth year], 70 y.o.   MRN: [id number] Met with the patient to review role of the nurse CM and collaboration with the SW to facilitate preparation for discharge. Reviewed secondary stroke risks including HLD, HTN and antiphospholipid syndrome on coumadin PTA. Reported INR had been off for a while PTA  However she had not followed up with her physician on changes in her medication or alteration in her diet prior to the stroke. Reviewed DASH diet and cooking with less salt. Also discussed dietary limitations with coumadin however patient changed to Eliquis and ASA per MD. Continue to follow along to discharge to review concerns and questions and educational needs. Margarito Liner

## 2021-01-02 NOTE — Progress Notes (Signed)
Physical Therapy Session Note  Patient Details  Name: [name] MRN: [id number] Date of Birth: 07/28/1951   PT Individual Time: 0915-1000 and 1415-1500 PT Individual Time Calculation (min): 45 min and 45 mins  Today's Date: 01/02/2021 Session 1: PT Individual Time: 7341-9379; 1132-1207; 1330-1401 PT Individual Time Calculation (min): 61 min and 35 min and 31 min  Short Term Goals: Week 1:  PT Short Term Goal 1 (Week 1): pt will transfer to [facility] with mod assist consistently PT Short Term Goal 2 (Week 1): Pt will perform bed mobility with min assist PT Short Term Goal 3 (Week 1): Pt will ambulate 39ft with mod assist and BUE support on RW and +2 for safety PT Short Term Goal 4 (Week 1): Pt will propell WC 164ft with supervision assist  Skilled Therapeutic Interventions/Progress Updates:    Patient supine in bed upon PT arrival. Patient alert and agreeable to PT session. Patient c/o intermittent L knee pain during session as well as pain from sore on lateral aspect of L foot. Faces scale used with demonstration of 4/10 "hurts a little more" with each indication of pain. Pain was brief and relieved with removing pressure from the area. MD present during session and inspected wound on L foot and reminded pt that she has acetaminophen available for pain relief. Unable to wear shoes this session d/t increased pressure to sore.  Therapeutic Activity: Bed Mobility: Supine --> sit performed with pt able to bring BLE to EOB with Min A for LLE with vc for rotating hips. UB req's Max A +2. Provided verbal cues for using R elbow to push from bed surface as BLE lower from bed. Return to supine at end of session with Max A +2. Requires Max A for positioning of shoulders and BLE for neutral body positioning.  Transfers: Patient performed STS initially to RW with Max A and elevated bed surface. Unable to maintain standing for more than 15sec x2. Transfer to toilet using STEDY and pt requires assist  with R foot positioning and maintaining position throughout to prevent ankle sprain. Transition to [facility] for toilet transfer. She is able to pull on STEDY with LUE throughout remainder of session with Mod A. Pt noted with intermittent R lean with no LOB. She is able to correct with vc for self check-in on posture/ proprioception of posture. Max A for pericare and LB dressing in STEDY.  Neuromuscular Re-ed: NMR facilitated during session with focus on sitting balance and RLE muscle facilitation during stance for improved upright standing posture. Pt guided in dressing activity requiring pt to maintain seated position working on activity tolerance as well as return to midline positioning following each reach for article of clothing. Multiple LOB posteriorly requiring max A with no UE support to pull or Mod A with LUE pull on bedrail or PT's arm. While standing in STEDY, pt instructed to maintain R knee extension with noted fatigue. Short bursts of strength noted with ability to contract quads and glutes but unable to hold for more than 10-20sec.  NMR performed for improvements in motor control and coordination, balance, sequencing, judgement, and self confidence/ efficacy in performing all aspects of mobility at highest level of independence.   Patient seated upright in w/c at end of session with brakes locked, chair alarm set, and all needs within reach.  Session 2:  Skilled Therapeutic Interventions/Progress Updates:    Patient seated upright in w/c on entrance to room. Alert and greeable to therapy. No c/o pain throughout  this session.   Therapeutic Activity: Bed Mobility: At end of session, pt performs sit EOB --> supine requiring Mod A +2. Vc provided for technique.   Transfers: STS performed in // bars x4 with Mod A and improving to Min A for 4th trial. VC for reaching forward and initiating forward lean, vc with Max A for R foot placement. On return to room from therapy gym, STEDY used for toilet  transfer and pt performs stand in [place] with Min A x3. Max A for pericare and LB dressing.   Neuromuscular Re-ed:  NMR reed facilitated with focus on RLE muscle activation during standing. Pt guided in standing in // bars with lateral weight shifting and physical block of R knee with pressure at glutes and quads for increased muscle activation. VC provided for upright posture and glute sets to improve her noted forward and R sided lean with fatigue. Final two standing bouts with performance of minisquats with focus on hip/ knee flexion bilaterally. Pt performs x10 each bout and vc for increased hip/ knee flexion and Max A for R knee block. Seated therapeutic rest breaks between each bout for recovery from fatigue. NMR performed for improvements in motor control and coordination, balance, sequencing, judgement, and self  efficacy in performing all aspects of mobility at highest level of independence.   Patient seated upright in bed at end of session with brakes locked, bed alarm set, and all needs within reach. Lunch arriving at end of session.   Session 3:  Patient supine in bed upon PT arrival. Patient alert and agreeable to PT session. Patient denied pain during session.  Therapeutic Activity: Bed Mobility: Patient performed supine to sit EOB with Min A for RLE with vc for pivoting hips to assist. Mod A provided to UB with pt initiating and requiring mod A to complete to upright sitting.  Provided verbal cues for technique. Return to supine at end of session with mod A for RLE and pt able to bring UB to bed surface in coordination with LE. Pt guided in use of BLE to assist with move toward [facility]. Max A for RLE placement in hooklying and able to complete with Mod A.   Neuromuscular Re-ed: NMR facilitated during session with focus on seated balance throughout BLE therex. Pt guided in midline orientation and maintaining upright posture during LLE AROM hip flexion, abd/ add, knee extension, and Bil heel  raises. Posterior LOB x3 throughout. Noted knee extension reflex in RLE with hip and UB extension - pt unaware of RLE movement. RLE AAROM for hip flexion, abd/ add, knee extension, and heel raises. VC throughout for performance and trace activation noted for all exercises, except for 2-/ 5 activation in knee extension for first 2 repetitions. Supine therex performed including bil clamshells, trunk rotation with bil knees lowered to each side and bridging. Increased extensor tone noted in RLE with request for flexion into hooklying position. Mod A provided to RLE for clamshell performance. NMR performed for improvements in motor control and coordination, balance, sequencing, judgement, and self confidence/ efficacy in performing all aspects of mobility at highest level of independence.   Patient supine in bed at end of session with brakes locked, bed alarm set, and all needs within reach.   Therapy Documentation Precautions:  Precautions Precautions: Fall risk, R hemipareisis Precaution Comments: decreased L visual field Restrictions Weight Bearing Restrictions: No   Therapy/Group: Individual Therapy  [name] 01/02/2021, 10:16 AM

## 2021-01-02 NOTE — Progress Notes (Signed)
Occupational Therapy Session Note  Patient Details  Name: [name] MRN: [id number] Date of Birth: 07/01/1951  Today's Date: 01/02/2021 OT Individual Time: 1000-1100 OT Individual Time Calculation (min): 60 min    Short Term Goals: Week 1:  OT Short Term Goal 1 (Week 1): Pt will complete BSC/toilet transfer with mod A without lift. OT Short Term Goal 2 (Week 1): Pt will maintain static sitting balance to complete seated grooming unsupported with min A. OT Short Term Goal 3 (Week 1): Pt will don shirt with min A.  Skilled Therapeutic Interventions/Progress Updates:    Patient seated in w/c, alert and ready for therapy session.  She denies pain and is pleasant and cooperative.  Mod A to stand on stedy surface from w/c, shower bench  - utilized stedy for transfers this session.  Max A to doff clothing - shower completed seated on shower bench requiring assistance for thoroughness with bilateral arms and lower legs, dependent to wash buttocks, CS and cues for sitting balance.  Dressing completed w/c level - mod A for [place] shirt, max/dependent for incontinence brief, pants, dependent for footwear.  Clothing management in stance with left rail dependent.  She remained seated in w/c at close of session, seat belt alarm set and call bell in reach.  Therapy Documentation Precautions:  Precautions Precautions: Fall Precaution Comments: decreased L visual field Restrictions Weight Bearing Restrictions: No   Therapy/Group: Individual Therapy  Carlos Levering 01/02/2021, 7:44 AM

## 2021-01-02 NOTE — Progress Notes (Signed)
Nurse went to round on patient, wet brief changed and peri care performed. Pt daughter in room and asked nurse questions referring to pt labs. Nurse also educated pt/daughter on blood pressure control handout, and answered questions as asked, patient and daughter verbalized understanding and teachback. Nurse secure messaged MD about questions regarding lab results.

## 2021-01-02 NOTE — Progress Notes (Signed)
STROKE TEAM PROGRESS NOTE   HISTORY OF PRESENT ILLNESS (per record) [name] a 70 y.o.femalewith a past medical history significant for recent small multifocal strokes (with right leg weakness), dementia, multiple falls, depression, hypertension, prior strokes in the right hemisphere (residual left hemianopia), antiphospholipid antibody syndrome and DVTs on Coumadin. She recently presented on 1/24 as a code stroke to [facility]. Seen by Dr. Rory Percy via teleneurology;too mild to treat based on an NIH of 1 and family declinedtPA on discussion of risk/benefit in the setting of these mild symptoms. Stroke etiology felt to be antiphospholipid syndrome in the setting of subtherapeutic INR (1.2). She presented to rehab here on 1/27/2022later in the day-yesterday morning on evaluation she did not appear to have any significant right upper extremity weakness,though perhaps had some subtle difficulty using her right upper extremity when walking with physical therapy. This morning she is significantly weaker in the right upper and lower extremity for which neurology is reconsulted. MRI performed 12/31/20 On review of MRI head and cervical spine, given that it has already been 3 days since the majority of the stroke seen on MRI and the additional stroke is very small, risks of starting anticoagulationareoutweighed by the benefit.Risks of intracerebral hemorrhage were reviewed with the patient and she expressed her understanding. -Continue every 4 hours neuro checks -Heparin low goal no bolus drip,management per pharmacist -Stroke team to follow  LKW:12/30/20 08:00 tPA given?: No,out of time window IA performed?: No,no LVO indications Premorbid modified rankin scale: 1 - No significant disability. Able to carry out all usual activities, despite some symptoms.Patient with left hemianopia from stroke approximately 20 years ago, unable to drive but independently performs all  ADLs.   INTERVAL HISTORY Patient is sitting up in bed today.  She states right-sided weakness is slightly improved but not back to her previous baseline.  She has been switched to switch to Eliquis for anticoagulation.  Vital signs stable.  No complaints    OBJECTIVE       Vitals:   12/31/20 0514 12/31/20 1542 12/31/20 1933 01/01/21 0450  BP: 127/76 137/67 140/63 108/66  Pulse: 71 75 71 83  Resp:  17 18 18   Temp: 98.2 F (36.8 C) 98.2 F (36.8 C) 99.5 F (37.5 C) 98 F (36.7 C)  TempSrc:  Oral    SpO2: 96% 98% 97% 98%  Weight:      Height:        CBC:  Last Labs          Recent Labs  Lab 12/26/20 1953 12/27/20 0350 12/29/20 0543 12/30/20 0709 01/01/21 0306  WBC 6.3   < > 4.4 4.5 5.6  NEUTROABS 4.6  --  2.3  --   --   HGB 10.6*   < > 11.2* 10.7* 10.3*  HCT 31.9*   < > 33.4* 33.2* 31.6*  MCV 99.4   < > 98.5 101.5* 101.3*  PLT 86*   < > 85* 89* 94*   < > = values in this interval not displayed.      Basic Metabolic Panel:  Last Labs        Recent Labs  Lab 12/27/20 0350 12/30/20 0709 12/31/20 1213  NA 138 138  --   K 3.7 4.0  --   CL 105 103  --   CO2 24 24  --   GLUCOSE 114* 129*  --   BUN 26* 32*  --   CREATININE 1.46* 1.77*  --   CALCIUM 9.3 9.7  --  MG  --   --  2.0      Lipid Panel:  Labs (Brief)          Component Value Date/Time   CHOL 158 12/27/2020 0350   CHOL 196 02/10/2020 1005   TRIG 77 12/27/2020 0350   HDL 56 12/27/2020 0350   HDL 64 02/10/2020 1005   CHOLHDL 2.8 12/27/2020 0350   VLDL 15 12/27/2020 0350   LDLCALC 87 12/27/2020 0350   LDLCALC 111 (H) 02/10/2020 1005     HgbA1c:  Recent Labs       Lab Results  Component Value Date   HGBA1C 5.6 12/27/2020     Urine Drug Screen:  Labs (Brief)          Component Value Date/Time   LABOPIA NONE DETECTED 12/26/2020 2232   COCAINSCRNUR NONE DETECTED 12/26/2020 2232   LABBENZ NONE DETECTED 12/26/2020 2232   AMPHETMU NONE  DETECTED 12/26/2020 2232   THCU NONE DETECTED 12/26/2020 2232   LABBARB NONE DETECTED 12/26/2020 2232      Alcohol Level  Labs (Brief)          Component Value Date/Time   ETH <10 12/26/2020 1953      IMAGING  MR BRAIN WO CONTRAST 12/31/2020 IMPRESSION:  Interval development of acute infarct in the left medial frontal parietal lobe in the ACA territory. New small areas of acute infarct in the left frontal convexity. Recent infarct in the right anterior frontal lobe unchanged from 12/26/2020. Chronic ischemic changes bilaterally.   MR CERVICAL SPINE WO CONTRAST 12/31/2020 IMPRESSION:  Mild right foraminal narrowing C3-4 and C4-5 due to spurring 3 mm retrolisthesis C5-6. Mild spinal stenosis and mild to moderate foraminal stenosis bilaterally due to spurring.   MRA HEAD AND NECK 12/26/20 IMPRESSION: 1. Small acute infarct within the anterior right frontal lobe. No hemorrhage or mass effect. 2. Multiple old infarcts and chronic small vessel ischemia. 3. Distal occlusion of the right PCA is likely chronic given old PCA territory infarct. 4. Moderate stenosis of the left P4 segment. 5. Time-of-flight MRA of the neck without hemodynamically significant stenosis.  CT HEAD CODE STROKE WO CONTRAST 12/26/20  IMPRESSION: 1. No acute finding by CT. 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 Chronic small-vessel ischemic changes of the white matter. 2. ASPECTS is 10, allowing for the old infarction.  Transthoracic Echocardiogram  12/27/20 IMPRESSIONS  1. Left ventricular ejection fraction, by estimation, is 55 to 60%. The  left ventricle has normal function. The left ventricle has no regional  wall motion abnormalities. Left ventricular diastolic parameters are  consistent with Grade I diastolic  dysfunction (impaired relaxation).  2. Right ventricular systolic function is normal. The right ventricular  size is normal.  3. Left atrial size was mildly dilated.  4. The  mitral valve is grossly normal. Trivial mitral valve  regurgitation.  5. The aortic valve was not well visualized. Aortic valve regurgitation  is trivial.   ECG - SR rate 75 BPM. (See cardiology reading for complete details)  PHYSICAL EXAM Blood pressure 108/66, pulse 83, temperature 98 F (36.7 C), resp. rate 18, height 5\' 8"  (1.727 m), weight 83.4 kg, SpO2 98 %. GENERAL:  Pleasant elderly Caucasian lady not in distress.  She is sitting up in bed HEENT:  Normal  ABDOMEN: soft  EXTREMITIES: Mild ankle edema   BACK: normal  SKIN: Normal by inspection.    MENTAL STATUS: Alert and oriented. Speech, language and cognition are generally intact. Judgment and insight normal.  CRANIAL NERVES: Pupils are equal, round and reactive to light and accomodation; extra ocular movements are full, there is no significant nystagmus; visual fields are full; upper and lower facial muscles are normal in strength and symmetric, there is no flattening of the nasolabial folds; tongue is midline; uvula is midline; shoulder elevation is normal.  MOTOR:  Right hemiplegia with right upper extremity barely able to flex fingers and extend her elbow slightly.  Right lower extremity 2/5 strength with some movement possible against gravity.  Left side 4  +/5.  COORDINATION: Left finger to nose is normal, right finger to nose is normal, No rest tremor; no intention tremor; no postural tremor; no bradykinesia.  REFLEXES: Deep tendon reflexes are symmetrical and normal. Plantar reflexes are flexor bilaterally.   SENSATION: Normal to light touch, temperature, and pain.          ASSESSMENT/PLAN Ms. [name] is a 70 y.o. female with history of recent small multifocal strokes (with right leg weakness), dementia, multiple falls, depression, renal insufficiency, hypertension, prior strokes in the right hemisphere (residual left hemianopia), antiphospholipid antibody syndrome and DVTs on  Coumadin who presented to [facility] 1/24 and was seen by Dr Rory Percy via teleneurology for right leg weakness. INR 1.2. Sxs were mild. Pt admitted to [facility] CIR 1/27 -> pt developed increased weakness on right and was seen by Dr Curly Shores. Mri revealed new small stroke -> heparin IV -> admitted to acute bed. She did not receive IV t-PA due to recent stroke  Stroke: Interval development of acute infarct in the left medial frontal parietal lobe in the ACA territory. New small areas of acute infarct in the left frontal convexity - embolic - antiphospholipid antibody syndrome  Resultant   Severe right hemiparesis  Code Stroke CT Head - 12/26/20 - No acute finding by CT. 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 Chronic small-vessel ischemic changes of the white matter. ASPECTS is 10, allowing for the old infarction.  CT head - not ordered  MRI head - Interval development of acute infarct in the left medial frontal parietal lobe in the ACA territory. New small areas of acute infarct in the left frontal convexity. Recent infarct in the right anterior frontal lobe unchanged from 12/26/2020. Chronic ischemic changes bilaterally.  MRA H&N - 12/26/20 - Distal occlusion of the right PCA is likely chronic given old PCA territory infarct. Moderate stenosis of the left P4 segment. Time-of-flight MRA of the neck without hemodynamically significant stenosis.  CTA H&N - not ordered  CT Perfusion - not ordered  Carotid Doppler - MRA neck ordered - carotid dopplers not indicated  2D Echo - 12/27/20 - EF 55 to 60%.  No cardiac source of emboli identified.   Lacey Jensen Virus 2 - 12/26/20 - negative  LDL - 87  HgbA1c - 5.6  UDS - 12/26/20 - negative  VTE prophylaxis - heparin IV Diet      Diet Order                  Diet Heart Room service appropriate? Yes; Fluid consistency: Thin  Diet effective now                   warfarin daily prior to admission, now on aspirin 81 mg daily and heparin  IV  Patient will be counseled to be compliant with her antithrombotic medications  Ongoing aggressive stroke risk factor management  Therapy recommendations:  pending  Disposition:  Pending  Hypertension  Home BP meds:  losartan 25 mg daily  Current BP meds: losartan 25 mg daily  BP mildly low at times - 108/86 - consider stopping cozaar for now in setting of acute stroke  Permissive hypertension (OK if < 220/120) but gradually normalize in 5-7 days   Long-term BP goal normotensive  Hyperlipidemia  Home Lipid lowering medication: Lipitor 10 mg daily   LDL 87, goal < 70  Current lipid lowering medication: Lipitor 80 mg daily   Continue statin at discharge  Other Stroke Risk Factors  Advanced age  ETOH use, advised to drink no more than 1 alcoholic beverage per day.  Overweight, Body mass index is 27.96 kg/m., recommend weight loss, diet and exercise as appropriate   Family hx stroke (father)   Hx stroke/TIA  Antiphospholipid antibody syndrome  Other Active Problems, Findings, Recommendations and/or Plan  Code status - Full code   Warfarin PTA - currently on hold -  INR - 1.2->1.3->1.4   Anemia - Hgb - 11.2->10.7->10.3  Thrombocytopenia - Plts - 85->89->94  CKD - stage 3b - creatinine - 1.46->1.77  Hypothyroid - TSH - 2.920->11.289 - levothyroxine (SYNTHROID) 75 MCG tablet -> increased to 100 mcg daily 12/30/20   Hospital day # 3 Patient had antiphospholipid antibodies syndrome and these people are typically hypercoagulable and difficult to treat with most anticoagulants.  Since she had an episode on IV heparin and aspirin and warfarin is difficult to regulate I agree with Eliquis for anticoagulation.  She continues to have recurrent episodes may need hematology referral in the future.  Greater than 50% time during this 25-minute visit was spent on counseling and coordination of care about stroke and answering questions.  Discontinue aspirin due to  increased risk of bleeding with Eliquis.  Follow-up as an outpatient stroke clinic in 6 weeks.  Stroke team will sign off.  Can for questions. Antony Contras, MD To contact Stroke Continuity provider, please refer to [email or web address] After hours, contact General Neurology

## 2021-01-02 NOTE — Progress Notes (Signed)
[place] PHYSICAL MEDICINE & REHABILITATION PROGRESS NOTE   Subjective/Complaints: C/o pain at open blister on lateral aspect of left foot. Discussed applying betadine twice per day and using Tylenol as needed for pain and she is agreeable. PC up to 100  ROS- neg CP, SOB, N/V/D, +pain at open blister on lateral aspect of left foot  Objective:   No results found. Recent Labs    01/01/21 0306 01/02/21 0506  WBC 5.6 5.5  HGB 10.3* 10.2*  HCT 31.6* 30.2*  PLT 94* 100*   No results for input(s): NA, K, CL, CO2, GLUCOSE, BUN, CREATININE, CALCIUM in the last 72 hours.  Intake/Output Summary (Last 24 hours) at 01/02/2021 1213 Last data filed at 01/02/2021 0851 Gross per 24 hour  Intake 480 ml  Output --  Net 480 ml        Physical Exam: Vital Signs Blood pressure 116/72, pulse 76, temperature 97.9 F (36.6 C), resp. rate 18, height 5\' 8"  (1.727 m), weight 83.4 kg, SpO2 100 %.  General: No acute distress Mood and affect are appropriate Heart: Regular rate and rhythm no rubs murmurs or extra sounds Lungs: Clear to auscultation, breathing unlabored, no rales or wheezes Abdomen: Positive bowel sounds, soft nontender to palpation, nondistended Extremities: No clubbing, cyanosis, or edema Skin: +open blister on lateral aspect of left foot with surrounding erythema, tender to palpation. No evidence of purulence Neurologic: Cranial nerves II through XII intact, motor strength is 5/5 in Left  deltoid, bicep, tricep, grip, hip flexor, knee extensors, ankle dorsiflexor and plantar flexor RUE 3- delt bi tri grip RLE trace hip add o/w 0/5 Sensory exam normal sensation to light touch and proprioception in bilateral upper and lower extremities  Musculoskeletal: Full range of motion in all 4 extremities. No joint swelling, neg impingement sign    Assessment/Plan: 1. Functional deficits which require 3+ hours per day of interdisciplinary therapy in a comprehensive inpatient rehab  setting.  Physiatrist is providing close team supervision and 24 hour management of active medical problems listed below.  Physiatrist and rehab team continue to assess barriers to discharge/monitor patient progress toward functional and medical goals  Care Tool:  Bathing    Body parts bathed by patient: Right arm,Left arm,Chest,Abdomen,Front perineal area,Right upper leg,Left upper leg,Face   Body parts bathed by helper: Right lower leg,Left lower leg,Buttocks     Bathing assist Assist Level: Dependent - Patient 0% (stedy for STS to bathe buttocks)     Upper Body Dressing/Undressing Upper body dressing   What is the patient wearing?: Pull over shirt    Upper body assist Assist Level: Moderate Assistance - Patient 50 - 74%    Lower Body Dressing/Undressing Lower body dressing      What is the patient wearing?: Incontinence brief     Lower body assist Assist for lower body dressing: Total Assistance - Patient < 25%     Toileting Toileting    Toileting assist Assist for toileting: Maximal Assistance - Patient 25 - 49% (stedy)     Transfers Chair/bed transfer  Transfers assist     Chair/bed transfer assist level: Moderate Assistance - Patient 50 - 74%     Locomotion Ambulation   Ambulation assist   Ambulation activity did not occur: Safety/medical concerns (motor control limitations; L knee pain with wt bearing)  Assist level: 2 helpers Assistive device: Other (comment) Max distance:  (Stedy)   Walk 10 feet activity   Assist  Walk 10 feet activity did not occur: Safety/medical  concerns  Assist level: 2 helpers     Walk 50 feet activity   Assist Walk 50 feet with 2 turns activity did not occur: Safety/medical concerns         Walk 150 feet activity   Assist Walk 150 feet activity did not occur: N/A         Walk 10 feet on uneven surface  activity   Assist Walk 10 feet on uneven surfaces activity did not occur: Safety/medical  concerns (motor control limitations)         Wheelchair     Assist     Wheelchair activity did not occur: Safety/medical concerns (time constraints due to toileting)  Wheelchair assist level: Moderate Assistance - Patient 50 - 74% Max wheelchair distance: 50    Wheelchair 50 feet with 2 turns activity    Assist        Assist Level: Moderate Assistance - Patient 50 - 74%   Wheelchair 150 feet activity     Assist      Assist Level: Total Assistance - Patient < 25%   Blood pressure 116/72, pulse 76, temperature 97.9 F (36.6 C), resp. rate 18, height 5\' 8"  (1.727 m), weight 83.4 kg, SpO2 100 %.  Medical Problem List and Plan: 1.Right leg weaknesssecondary to small acute infarct within the anterior right frontal lobe. Multiple old infarcts and chronic small vessel ischemia. New RIght ACA infarct on IV hep per neuro , will be seen by stroke team in am and hopefully can transition to warfarin  -patient may  shower -ELOS/Goals: 14-18d  Continue CIR 2. Antithrombotics: -DVT/anticoagulation:Chronic Coumadin therapy. Continue Lovenox till INR therapeutic 2.5-3.5 1/31: INR reviewed and is not yet therapeutic at 1.4 -antiplatelet therapy: Aspirin 81 mg daily 3. Pain Management:Tylenol as needed shoulder pain on RIght add Kpad , sportscream 4. Mood:Aricept 10 mg daily, Lexapro 20 mg daily -antipsychotic agents: N/A 5. Neuropsych: This patientiscapable of making decisions on herown behalf. 6. Skin/Wound Care:Routine skin checks 7. Fluids/Electrolytes/Nutrition:Routine in and outs with follow-up chemistries 8. Antiphospholipid antibody syndrome/thrombocytopenia. Follow-up hematology services. Follow-up CBC. Continue Coumadin therapy  1/31: PC reviewed, improved to 100.  9. Hypertension. Monitor with increased mobility     Vitals:   12/30/20 0310 12/30/20 0412  BP: 121/73 119/70  Pulse: 68 67   Resp: 16 16  Temp: 98 F (36.7 C) 98.1 F (36.7 C)  SpO2: 100% 97%  Controlled, continue Cozaar 25mg  daily 10. Hypothyroidism. Synthroid 11. Hyperlipidemia. Lipitor 12. CKD stage III. Follow-up chemistries 13. Diarrhea. Patient was treated for loose stools daily for the past few weeks. Plan outpatient follow-up GI. Basic stool studies including GI pathogen panel fecal lactoferrin TSH and free T4 pending.Maintain precautions until work-up completed. Continue Imodium as needed #14.  Irregular heart rhythm EKG shows occasional PVC no treatment needed    LOS: 4 days A FACE TO FACE EVALUATION WAS PERFORMED  Clide Deutscher Lanisa Ishler 01/02/2021, 12:13 PM

## 2021-01-03 DIAGNOSIS — I639 Cerebral infarction, unspecified: Secondary | ICD-10-CM | POA: Diagnosis not present

## 2021-01-03 MED ORDER — ATORVASTATIN CALCIUM 10 MG PO TABS
20.0000 mg | ORAL_TABLET | Freq: Every day | ORAL | Status: DC
  Administered 2021-01-03 – 2021-01-20 (×17): 20 mg via ORAL
  Filled 2021-01-03 (×18): qty 2

## 2021-01-03 MED ORDER — OXYBUTYNIN CHLORIDE 5 MG PO TABS
2.5000 mg | ORAL_TABLET | Freq: Three times a day (TID) | ORAL | Status: DC
  Administered 2021-01-03 – 2021-01-06 (×9): 2.5 mg via ORAL
  Filled 2021-01-03 (×9): qty 1

## 2021-01-03 NOTE — Consult Note (Signed)
Neuropsychological Consultation   Patient:   [name]   DOB:   May 31, 1951  MR Number:  [id number]  Location:  [facility] [address] C[facility] [address] [place] [id number] [place] [address] Dept: [place]: [phone]           Date of Service:   01/03/2021  Start Time:   1 PM End Time:   2 PM  Provider/Observer:  Ilean Skill, Psy.D.       Clinical Neuropsychologist       Billing Code/Service: [phone]  Chief Complaint:    [name] is a 70 year old female with history of CVA in 2004 with residual left lateral visual field deficits.  Patient also has a history of DVT and antiphospholipid antibodies syndrome that was also diagnosed in 2004 and played a role in her CVA.  Patient has been maintained on chronic Coumadin.  Patient with chronic kidney disease stage III, hypertension, hyperlipidemia, hypothyroidism and mild cognitive impairments and maintained on Aricept.  Patient has been followed by [facility] neurology due to mild cognitive impairment including difficulties with short-term memory and following instructions.  Patient is described to have had several falls which are believed to be due to left-sided blindness.  Memory deficits are most likely related to cerebrovascular events including prior strokes and residual effects of antiphospholipid antibody syndrome.  Patient has been prescribed Aricept due to memory changes which has been shown to be potentially helpful for cerebrovascular related dementia beyond Alzheimer's dementia.  Patient has had to be a caretaker for her husband who has significant medical issues including kidney transplant in the past.  Patient does have a history of trouble sleeping due to anxiety and waking up at night.  Patient presented on 12/26/2020 with acute onset of right leg weakness.  Cranial CT scan showed no acute findings and MRI confirmed multiple old infarcts in the right  occipital lobe at the right parietal vortex and at the left frontal convexity as well as chronic small vessel ischemic changes in the white matter.  MRI also showed small acute infarct within the anterior right frontal lobe and issues related to her right PCA likely chronic given old PCA territory infarction.  Reason for Service:  Patient was referred for neuropsychological consultation due to coping adjustment issues and residual cognitive deficits following CVA in the setting of prior CVA.  Below see HPI for the current admission.  [name] a 70 year old right-handed female with history of CVA 2004 with residual left lateral visual field deficits, history of DVT and antiphospholipid antibody syndrome diagnosed 2004 followed by Dr. Otilio Miu maintained on chronic Coumadin, CKD stage III, hypertension, hyperlipidemia, hypothyroidism and mild cognitive impairments maintained on Aricept. Per chart review patient lives with spouse. Multilevel home bed and bath main level 5 steps to entry. Husband is limited physically to assist as personal care attendance and currently is admitted to [facility] as a renal transplant. They do have a daughter here visiting from [place] at present. Patient reportedly independent prior to admission. Presented 12/26/2020 with acute onset of right leg weakness. Cranial CT scan showed no acute findings. Multiple old infarcts in the right occipital lobe at the right parietal vertex and at the left frontal convexity as well as chronic small vessel ischemic changes in the white matter. Patient did not receive TPA. MRI showed small acute infarct within the anterior right frontal lobe. No hemorrhage or mass-effect. Distal occlusion of the right  PCA likely chronic given old PCA territory infarction. Moderate stenosis of the left P4 segment. Admission chemistries unremarkable except BUN 30 creatinine 1.68 glucose 102, urine drug screen negative, hemoglobin  11.1, platelets 86,000, INR 1.2.. Echocardiogram with ejection fraction of 55 to 60% no wall motion abnormalities. Grade 1 diastolic dysfunction. Neurology follow-up patient currently remains on chronic Coumadin therapy with Lovenox till INR therapeutic with goal INR of 2.5-3.5 as well as low-dose aspirin. Follow-up oncology services for thrombocytopenia maintained on Coumadin therapy. Peripheral smear revealed no evidence of schistocytes and recommendations of routine follow-up CBC. Bilateral lower extremity Dopplers negative. Patient with multiple bouts of loose stool 3-4 for the past few weeks. No abdominal pain or nausea vomiting. Recommendations are for outpatient GI follow-up. Orders for basic stool studies include GI pathogen panel with results pending and currently on enteric precautions. Therapy evaluations completed recommendations inpatient rehab services secondary right side weakness and decreased functional ability as well as cognitive deficits.  Behavioral Observation: [name]  presents as a 70 y.o.-year-old Right Caucasian Female who appeared her stated age. her dress was Appropriate and she was Well Groomed and her manners were Appropriate to the situation.  her participation was indicative of Appropriate and Redirectable behaviors.  There were physical disabilities noted.  she displayed an appropriate level of cooperation and motivation.     Interactions:    Active Appropriate and Redirectable  Attention:   abnormal and attention span appeared shorter than expected for age  Memory:   abnormal; remote memory intact, recent memory impaired  Visuo-spatial:  abnormal  Speech (Volume):  normal  Speech:   normal; normal  Thought Process:  Coherent and Tangential with some repeating of questions and content  Though Content:  WNL; not suicidal and not homicidal  Orientation:   person and  place  Judgment:   Fair  Planning:   Poor  Affect:    Excited  Mood:    Euthymic  Insight:   Fair  Intelligence:   high  Medical History:   Past Medical History:  Diagnosis Date  . Arthritis 04/02/1996  . Depression   . GERD (gastroesophageal reflux disease) 04/03/2019  . Hyperlipidemia   . Hypertension   . Renal insufficiency   . Stroke ([place])   . Thyroid disease          Patient Active Problem List   Diagnosis Date Noted  . Ischemic cerebrovascular accident (CVA) of frontal lobe ([place]) 12/29/2020  . CVA (cerebral vascular accident) ([place]) 12/27/2020  . Acute CVA (cerebrovascular accident) ([place]) 12/26/2020  . Thrombocytopenia ([place]) 12/26/2020  . Antiphospholipid antibody syndrome ([place]) 02/10/2020  . Acute kidney failure ([place]) 11/04/2019  . Edema of lower extremity 11/04/2019  . Stage 3 chronic kidney disease ([place]) 11/04/2019  . Popliteal bursitis of left knee 08/25/2019  . Familial hypercholesterolemia 08/25/2019  . Renal insufficiency 08/25/2019  . Low hemoglobin 08/25/2019  . History of essential hypertension 08/25/2019  . Seasonal allergic rhinitis due to pollen 02/24/2019  . Age-related osteoporosis without current pathological fracture 01/20/2018  . Osteoporosis 11/12/2017  . Urinary incontinence 05/28/2017  . Knee pain 05/08/2017  . Muscle weakness 05/08/2017  . Sprain of MCL (medial collateral ligament) of knee 05/08/2017  . Adult hypothyroidism 10/23/2016  . Essential hypertension 10/23/2016  . Gastroesophageal reflux disease without esophagitis 10/23/2016  . Recurrent major depressive disorder, in full remission ([place]) 10/23/2016  . Anticoagulant long-term use 10/23/2016    Psychiatric History:  Patient with past history of depression but denies any  significant severe depressive symptoms at this point.  She is also had some anxiety around the care of her husband who has significant medical issues and past kidney transplant.  Family Med/Psych  History:  Family History  Problem Relation Age of Onset  . Stroke Father   . Breast cancer Maternal Grandmother   . Kidney cancer Mother   . Cancer Mother   . Arthritis Brother     Impression/DX:  [name] is a 70 year old female with history of CVA in 2004 with residual left lateral visual field deficits.  Patient also has a history of DVT and antiphospholipid antibodies syndrome that was also diagnosed in 2004 and played a role in her CVA.  Patient has been maintained on chronic Coumadin.  Patient with chronic kidney disease stage III, hypertension, hyperlipidemia, hypothyroidism and mild cognitive impairments and maintained on Aricept.  Patient has been followed by [facility] neurology due to mild cognitive impairment including difficulties with short-term memory and following instructions.  Patient is described to have had several falls which are believed to be due to left-sided blindness.  Memory deficits are most likely related to cerebrovascular events including prior strokes and residual effects of antiphospholipid antibody syndrome.  Patient has been prescribed Aricept due to memory changes which has been shown to be potentially helpful for cerebrovascular related dementia beyond Alzheimer's dementia.  Patient has had to be a caretaker for her husband who has significant medical issues including kidney transplant in the past.  Patient does have a history of trouble sleeping due to anxiety and waking up at night.  Patient presented on 12/26/2020 with acute onset of right leg weakness.  Cranial CT scan showed no acute findings and MRI confirmed multiple old infarcts in the right occipital lobe at the right parietal vortex and at the left frontal convexity as well as chronic small vessel ischemic changes in the white matter.  MRI also showed small acute infarct within the anterior right frontal lobe and issues related to her right PCA likely chronic given old PCA territory infarction.  Patient  was alert and oriented today and denied any significant emotional distress or worsening in her depressive symptomatology.  The patient was somewhat perseverative and repeated the same description of what was happening with her husband on 3 separate occasions.  The patient's daughter was present and reported that this was common behavior observed with the patient over the past year or so and we discussed the patient's past involvement with [facility] neurology.  Symptoms described are not consistent with a progressive dementia such as Alzheimer's or Lewy body dementia is likely directly related to past CVA and other cerebrovascular issues including microvascular ischemic changes and subcortical white matter regions.  Diagnosis:    Ischemic cerebrovascular accident (CVA) of frontal lobe ([place]) - Plan: Ambulatory referral to Neurology         Electronically Signed   _______________________ Ilean Skill, Psy.D. Clinical Neuropsychologist

## 2021-01-03 NOTE — Progress Notes (Addendum)
Occupational Therapy Session Note  Patient Details  Name: [name] MRN: [id number] Date of Birth: 08-13-1951  Today's Date: 01/03/2021 OT Individual Time: 0923-3007 OT Individual Time Calculation (min): 49 min    Short Term Goals: Week 1:  OT Short Term Goal 1 (Week 1): Pt will complete BSC/toilet transfer with mod A without lift. OT Short Term Goal 2 (Week 1): Pt will maintain static sitting balance to complete seated grooming unsupported with min A. OT Short Term Goal 3 (Week 1): Pt will don shirt with min A.  Skilled Therapeutic Interventions/Progress Updates:    Pt received semi-reclined in bed, agreeable to OT. Side roll >R with min A to place RUE on bed rail, side lying > sitting EOB with mod A to lift trunk + bring BLE off bed, able to assist by hooking L ankle under R. Slide-board transfer to w/c with overall max A to faciliate forward trunk flexion + BUE/BLE placement. Donned jacket with min A to thread LUE, doffed with close S. Donned/doffed shoes with total A.  Completed the following assessments: Hand Dynamometer: L hand: 52, 49, 29; average of 43 lbs; R hand: 18, 12, 10; average of 13 lbs. 9HPT: R=L : 1 min 1 sec, L: able to remove all pegs, only able to insert one after 5 min. Reassessed RUE 2/2 new stroke 1/29. AAROM ~ 120 degrees, unable to complete thumb-digit opposition, over shoots and slowed movement during digit-nose test. 4-/5 strength in shoulder flexion.  Given tan/least resistive theraputty with 10 beads inside. Pt completed 1x5 of the following: putty squeezes, removing/placing beads in putty, rolling balls, and pinching off pieces with pincer grasp. Req increased time and assist from L hand to complete 2/2 decreased [place]. Additionally given red tubing to build up silverware handle as pt reports difficulty self feeding 2/2 weak grasp.   Transported back to room in w/c, completed wc<>bed transfer same manner as before. Sitting EOB > supine with min A to bring  RLE onto bed. Left semi-reclined in bed, call bell in reach, bed alarm engaged, and all immediate needs met.  Therapy Documentation Precautions:  Precautions Precautions: Fall Precaution Comments: decreased L visual field Restrictions Weight Bearing Restrictions: No Pain: Pain Assessment Pain Scale: 0-10 Pain Score: 0-No pain ADL: See Care Tool for more details.   Therapy/Group: Individual Therapy  Volanda Napoleon MS, OTR/L  01/03/2021, 3:45 PM

## 2021-01-03 NOTE — Progress Notes (Signed)
Physical Therapy Session Note  Patient Details  Name: [name] MRN: [id number] Date of Birth: 04/21/1951  Today's Date: 01/03/2021 PT Individual Time: 1138-1207 PT Individual Time Calculation (min): 29 min   Short Term Goals: Week 1:  PT Short Term Goal 1 (Week 1): pt will transfer to [facility] with mod assist consistently PT Short Term Goal 2 (Week 1): Pt will perform bed mobility with min assist PT Short Term Goal 3 (Week 1): Pt will ambulate 57ft with mod assist and BUE support on RW and +2 for safety PT Short Term Goal 4 (Week 1): Pt will propell WC 120ft with supervision assist  Skilled Therapeutic Interventions/Progress Updates:    Pt received sitting in w/c and agreeable to therapy session.  Transported to/from gym in w/c for time management and energy conservation. Sit>stand w/c> BUE support on // bars with CGA for steadying - demos significant L weight shift compensation off of R LE. Standing in // bars with B UE support pre-gait training via LLE forward/backwards stepping working on [place] for stance control - demos R knee hyperextension, blocking this and no buckling noted - lacks sufficient R glute and hip musculature activation with cuing for improvement. R squat pivot w/c>EOM mod assist for lifting/pivoting hips - facilitation for increased R LE WBing and cuing for head/hips relationship. Repeated sit<>stands to/from elevated EOM only R UE support, with mod assist for lifting and balance x10reps - mirror feedback for improved midline orientation - therapist facilitating increased R LE WBing and hip/knee extension with trunk extension. Seated R LE NMR targeting hamstring activation via seated knee flexion - attempted multiple times with only trace activation noted when providing external target to kick with heel. L squat pivot EOM>w/c mod assist for lifting/pivoting and max cuing for head/hips relationship and decreased compensation with L UE. Transported back to room and agreeable  to remain sitting up for meal - left with needs in reach, seat belt alarm on, and R UE supported on 1/2 lap tray.   Therapy Documentation Precautions:  Precautions Precautions: Fall Precaution Comments: decreased L visual field Restrictions Weight Bearing Restrictions: No  Pain:   Reports hx of increased sensitivity to touch of her low back - therapist avoiding touching this area during facilitating. Reports hx of a "bumm" L knee with meniscal repair - discomfort in any WBing and and twisting motion.   Therapy/Group: Individual Therapy  Tawana Scale , PT, DPT, CSRS  01/03/2021, 7:58 AM

## 2021-01-03 NOTE — Progress Notes (Signed)
Occupational Therapy Session Note  Patient Details  Name: [name] MRN: [id number] Date of Birth: [birth year]  Today's Date: 01/03/2021 OT Individual Time: 0017-4944 OT Individual Time Calculation (min): 45 min    Short Term Goals: Week 1:  OT Short Term Goal 1 (Week 1): Pt will complete BSC/toilet transfer with mod A without lift. OT Short Term Goal 2 (Week 1): Pt will maintain static sitting balance to complete seated grooming unsupported with min A. OT Short Term Goal 3 (Week 1): Pt will don shirt with min A.  Skilled Therapeutic Interventions/Progress Updates:    Pt received in wc with dtr in the room with her. She stated she felt clean from yesterdays shower and would prefer to focus on RUE.  Pt was having difficulty getting repositioned in the wc so worked on techniques she can use to move hips back in chair.  Educated pt on the need to work on trunk strength along with arm as she had difficulty with forward lean and maintaining trunk elongation. Pt taken to gym and worked on sq pivot to R with mod A focusing on for ward wt shift to lift hips. Pt has difficulty lifting hips up for good scoot clearance so spent time on that movement pattern.  Pt worked on [employer] with a/arom exercises using med therapy ball pushing ball forward and back and to the side integrating in trunk elongation.  She actually has good AROM and can reach arm all the way above her head when she is focused on it BUT has significant R inattention to arm with functional movement and will often leave it hanging by her side.  Spent significant time working on attending to that side.  B ball holds, rolling ball forward with A to keep hand stabilized on ball.   Squat pivot back to wc with mod to her L focusing on lifting hips and forward wt shift and cues to not look (turn head) in direction she is going.  Pt taken back to room with belt alarm on and all needs met. Lap tray in place.  Therapy  Documentation Precautions:  Precautions Precautions: Fall Precaution Comments: decreased L visual field Restrictions Weight Bearing Restrictions: No    Vital Signs: Therapy Vitals Temp: 98.5 F (36.9 C) Pulse Rate: 81 Resp: 16 BP: 132/73 Patient Position (if appropriate): Lying Oxygen Therapy SpO2: 99 % O2 Device: Room Air Pain: no c/o pain   ADL: ADL Eating: Moderate assistance (for balance) Where Assessed-Eating: Edge of bed Grooming: Moderate assistance (for balance) Where Assessed-Grooming: Edge of bed Upper Body Bathing: Moderate assistance (for balance) Where Assessed-Upper Body Bathing: Edge of bed Lower Body Bathing: Dependent (stedy for STS) Where Assessed-Lower Body Bathing: Edge of bed Upper Body Dressing: Moderate assistance Where Assessed-Upper Body Dressing: Edge of bed Lower Body Dressing: Dependent (stedy for STS) Where Assessed-Lower Body Dressing: Edge of bed Toileting: Dependent (stedy for STS/hygeine/clothing) Where Assessed-Toileting: Glass blower/designer: Dependent (stedy) Toilet Transfer Method: Other (comment) (stedy) Science writer: Bedside commode,Grab bars Social research officer, government: Dependent Social research officer, government Method: Other (comment) (stedy) Walk-In Shower Equipment: Radio broadcast assistant   Therapy/Group: Individual Therapy  [place] 01/03/2021, 1:05 PM

## 2021-01-03 NOTE — Progress Notes (Addendum)
[place] PHYSICAL MEDICINE & REHABILITATION PROGRESS NOTE   Subjective/Complaints:  No new c/os, still has freq with urination, having about 3 stools per day No abd pain , no fevers No pain with urination  Discussed GI w/u and bladder issues with pt's daughter   ROS- neg CP, SOB, N/V/D, +pain at open blister on lateral aspect of left foot  Objective:   No results found. Recent Labs    01/01/21 0306 01/02/21 0506  WBC 5.6 5.5  HGB 10.3* 10.2*  HCT 31.6* 30.2*  PLT 94* 100*   No results for input(s): NA, K, CL, CO2, GLUCOSE, BUN, CREATININE, CALCIUM in the last 72 hours.  Intake/Output Summary (Last 24 hours) at 01/03/2021 0801 Last data filed at 01/03/2021 0755 Gross per 24 hour  Intake 940 ml  Output 300 ml  Net 640 ml        Physical Exam: Vital Signs Blood pressure 112/73, pulse 71, temperature 98.2 F (36.8 C), resp. rate 16, height 5\' 8"  (1.727 m), weight 83.4 kg, SpO2 98 %.   General: No acute distress Mood and affect are appropriate Heart: Regular rate and rhythm no rubs murmurs or extra sounds Lungs: Clear to auscultation, breathing unlabored, no rales or wheezes Abdomen: Positive bowel sounds, soft nontender to palpation, nondistended Extremities: No clubbing, cyanosis, or edema Skin: No evidence of breakdown, no evidence of rash   Skin: +open blister on lateral aspect of left foot with surrounding erythema, tender to palpation. No evidence of purulence Neurologic: Cranial nerves II through XII intact, motor strength is 5/5 in Left  deltoid, bicep, tricep, grip, hip flexor, knee extensors, ankle dorsiflexor and plantar flexor RUE 3- delt bi tri grip RLE trace hip add o/w 0/5 Sensory exam normal sensation to light touch and proprioception in bilateral upper and lower extremities  Musculoskeletal: Full range of motion in all 4 extremities. No joint swelling, neg impingement sign    Assessment/Plan: 1. Functional deficits which require 3+ hours per  day of interdisciplinary therapy in a comprehensive inpatient rehab setting.  Physiatrist is providing close team supervision and 24 hour management of active medical problems listed below.  Physiatrist and rehab team continue to assess barriers to discharge/monitor patient progress toward functional and medical goals  Care Tool:  Bathing    Body parts bathed by patient: Right arm,Left arm,Chest,Abdomen,Front perineal area,Right upper leg,Left upper leg,Face   Body parts bathed by helper: Right lower leg,Left lower leg,Buttocks,Right arm,Left arm,Front perineal area     Bathing assist Assist Level: Maximal Assistance - Patient 24 - 49%     Upper Body Dressing/Undressing Upper body dressing   What is the patient wearing?: Pull over shirt    Upper body assist Assist Level: Moderate Assistance - Patient 50 - 74%    Lower Body Dressing/Undressing Lower body dressing      What is the patient wearing?: Pants,Incontinence brief     Lower body assist Assist for lower body dressing: Total Assistance - Patient < 25%     Toileting Toileting    Toileting assist Assist for toileting: Total Assistance - Patient < 25%     Transfers Chair/bed transfer  Transfers assist     Chair/bed transfer assist level: Dependent - mechanical lift (Mod A with STEDY)     Locomotion Ambulation   Ambulation assist   Ambulation activity did not occur: Safety/medical concerns (motor control limitations; L knee pain with wt bearing)  Assist level: 2 helpers Assistive device: Other (comment) Max distance:  Charlaine Dalton)  Walk 10 feet activity   Assist  Walk 10 feet activity did not occur: Safety/medical concerns  Assist level: 2 helpers     Walk 50 feet activity   Assist Walk 50 feet with 2 turns activity did not occur: Safety/medical concerns         Walk 150 feet activity   Assist Walk 150 feet activity did not occur: N/A         Walk 10 feet on uneven surface   activity   Assist Walk 10 feet on uneven surfaces activity did not occur: Safety/medical concerns (motor control limitations)         Wheelchair     Assist Will patient use wheelchair at discharge?: Yes (Per PT long term goals) Type of Wheelchair: Manual Wheelchair activity did not occur: Safety/medical concerns (time constraints due to toileting)  Wheelchair assist level: Moderate Assistance - Patient 50 - 74% Max wheelchair distance: 50    Wheelchair 50 feet with 2 turns activity    Assist        Assist Level: Moderate Assistance - Patient 50 - 74%   Wheelchair 150 feet activity     Assist      Assist Level: Total Assistance - Patient < 25%   Blood pressure 112/73, pulse 71, temperature 98.2 F (36.8 C), resp. rate 16, height 5\' 8"  (1.727 m), weight 83.4 kg, SpO2 98 %.  Medical Problem List and Plan: 1.Right leg weaknesssecondary to small acute infarct within the anterior right frontal lobe. Multiple old infarcts and chronic small vessel ischemia. New RIght ACA infarct on IV hep per neuro , will be seen by stroke team in am and hopefully can transition to warfarin  -patient may  shower -ELOS/Goals: 14-18d  Continue CIR, team conf in am  2. Antithrombotics: -DVT/anticoagulation:Chronic Coumadin therapy. Continue Lovenox till INR therapeutic 2.5-3.5 1/31: INR reviewed and is not yet therapeutic at 1.4 -antiplatelet therapy: Aspirin 81 mg daily 3. Pain Management:Tylenol as needed shoulder pain on RIght add Kpad , sportscream 4. Mood:Aricept 10 mg daily, Lexapro 20 mg daily -antipsychotic agents: N/A 5. Neuropsych: This patientiscapable of making decisions on herown behalf. 6. Skin/Wound Care:Routine skin checks 7. Fluids/Electrolytes/Nutrition:Routine in and outs with follow-up chemistries 8. Antiphospholipid antibody syndrome/thrombocytopenia. Follow-up hematology services. Follow-up  CBC. now on Eliquis per neuro   1/31: PC reviewed, improved to 100.  9. Hypertension. Monitor with increased mobility     Vitals:   12/30/20 0310 12/30/20 0412  BP: 121/73 119/70  Pulse: 68 67  Resp: 16 16  Temp: 98 F (36.7 C) 98.1 F (36.7 C)  SpO2: 100% 97%  Controlled, continue Cozaar 25mg  daily 10. Hypothyroidism. Synthroid 11. Hyperlipidemia. Lipitor 12. CKD stage III. Follow-up chemistries 13. Diarrhea. Patient was treated for loose stools daily for the past few weeks. Plan outpatient follow-up GI. Basic stool studies including GI pathogen panel neg, C diff neg, discussed with daughter that now is not a good time to proceed wit hendoscopy due to recent CVA and anticoagulation  Will ask hosp GI service to advse if any other stool testing is indicated Maintain precautions until work-up completed. Continue Imodium as needed Reduced lipitor dose to see if this helps #14.  Irregular heart rhythm EKG shows occasional PVC no treatment needed  15.  Spastic bladder due to CVA UA shows some WBC but urine cath , contaminated specimen , would reculture if dysuria occurs or fever Trial oxybutnin LOS: 5 days A FACE TO FACE EVALUATION WAS PERFORMED  Luanna Salk  Elsy Chiang 01/03/2021, 8:01 AM

## 2021-01-03 NOTE — Progress Notes (Signed)
Physical Therapy Session Note  Patient Details  Name: [name] MRN: [id number] Date of Birth: [birth year]  Today's Date: 01/03/2021 PT Individual Time: 0904-1000 PT Individual Time Calculation (min): 56 min   Short Term Goals: Week 1:  PT Short Term Goal 1 (Week 1): pt will transfer to [facility] with mod assist consistently PT Short Term Goal 2 (Week 1): Pt will perform bed mobility with min assist PT Short Term Goal 3 (Week 1): Pt will ambulate 77ft with mod assist and BUE support on RW and +2 for safety PT Short Term Goal 4 (Week 1): Pt will propell WC 171ft with supervision assist  Skilled Therapeutic Interventions/Progress Updates:     Patient supine in bed upon PT arrival. Patient alert and agreeable to PT session. No pain complaint from pt throughout session.  Therapeutic Activity: Bed Mobility: Patient performed supine to sit with good initiation and requires Min A to UB in order to complete full rise to upright sitting. Provided verbal cues for pushing with UE from bed surface and for R hip abduction to bring RLE to EOB. She is unable to perform and and instead compensates with increased extensor tone to pivot entire body so that LE are off EOB.   Transfers: Patient attempted STS to no AD and req's Max A to stand. VC for upright posture with pt unable to perform. With addition of w/c armrest at EOB, she performs STS with push from bed surface and progresses hand to w/c armrest with light Min A. Suggesting possible reduced self efficacy and need for compensatory equipment d/t irrational FOF. STS mat <> RW with Min A. Provided verbal cues for forward lean, upright posture  Squat pivot transfers performed to pt's L side bed-->w/c with Mod A and w/c <> mat table requiring Min A to complete d/t less compliant surface.  Neuromuscular Re-ed: NMR facilitated during session with focus on seated reaching for improved sitting balance and trunk musculature facilitation. Seated reaches to  objects across midline and outside of BOS with pt able to complete and return to upright seated position with close SBA. Instructed to reach with L and/ or R hand only as well as with BUE. Pt also guided in standing balance and tolerance activity with use of targets to be placed to matching target on board. Use of RW in front of pt improves her overall performance. She is able to stand for almost 50minutes requiring vc for correcting R lean and R sided flexion with fatigue and focus with dual task. With vc/ tc to musculature, she is able to correct to upright standing balance with minimal use of LUE. NMR performed for improvements in motor control and coordination, balance, sequencing, judgement, and self confidence/ efficacy in performing all aspects of mobility at highest level of independence.   Patient seated upright in w/c at end of session with brakes locked, belt alarm set, and all needs within reach. Pt's dtr in room.   Therapy Documentation Precautions:  Precautions Precautions: Fall Precaution Comments: decreased L visual field, R hemipareisis, with increased R sided extensor tone demo'd during functional tasks Restrictions Weight Bearing Restrictions: No   Therapy/Group: Individual Therapy  [name] 01/03/2021, 11:38 AM

## 2021-01-04 DIAGNOSIS — I639 Cerebral infarction, unspecified: Secondary | ICD-10-CM | POA: Diagnosis not present

## 2021-01-04 DIAGNOSIS — I6612 Occlusion and stenosis of left anterior cerebral artery: Secondary | ICD-10-CM

## 2021-01-04 NOTE — Patient Care Conference (Signed)
Inpatient RehabilitationTeam Conference and Plan of Care Update Date: 01/04/2021   Time: 10:10 AM    Patient Name: [name] [facility]      Medical Record Number: [id number]  Date of Birth: 05/10/1951 Sex: Female         Room/Bed: 4M07C/4M07C-01 Payor Info: Payor: [place] / Plan: [employer] MEDICARE / Product Type: *No Product type* /    Admit Date/Time:  12/29/2020  4:51 PM  Primary Diagnosis:  Embolism of left anterior cerebral artery  Hospital Problems: Principal Problem:   Embolism of left anterior cerebral artery Active Problems:   Ischemic cerebrovascular accident (CVA) of frontal lobe [facility])    Expected Discharge Date: Expected Discharge Date: 01/27/21  Team Members Present: Physician leading conference: Dr. Alysia Penna Care Coodinator Present: Dorien Chihuahua, RN, BSN, CRRN;Christina Sampson Goon, [place] Nurse Present: Judee Clara, LPN PT Present: Barrie Folk, PT OT Present: Other (comment) Providence Lanius, OT) SLP Present: Jettie Booze, CF-SLP     Current Status/Progress Goal Weekly Team Focus  Bowel/Bladder   50/50 incontince vs continence of B&B.  Keep patient clean and dry and assesing for toileting needs encouraging use of toilet when needed.  Assess q 2 hours for touleting needs.   Swallow/Nutrition/ Hydration             ADL's   mod for seated UB ADL, max to dep in stedy for LB ADLs; [place] RUE: III to IV; Hand: IV  min A  RUE NMR, selfcare retraining, balance retraining, activity tolerance, pt/fam edu, DME/AE edu, functional transfer retraining   Mobility             Communication             Safety/Cognition/ Behavioral Observations            Pain   Pain from 6 to 10 out of 10 in head, shoulder, and ribs. Tylenol effective.  Pain at equal to or less than 3.  Assess pain q shift.   Skin   Small open blister on outer anterior left foot.  Progressive healing.  Clean and change wound dressing per orders and assess with each dressing  change.     Discharge Planning:  Discharging home with daughter. Goal to be placed in ALF LT, after d/c (due to Medicaid Pending)   Team Discussion: Second infarct since admission but weakness has improved over short period post event. Bladder issues, UA negative; anticipate CVA related and MD initiated bladder medication to help with incontinence and urgency. Incontinent of bowel; medication related. MD adjusted HLD medication. Progress limited by internal distraction, delayed processing , poor motor planning, right proprioception issues and baseline cognitive status.  Patient on target to meet rehab goals: yes, currently mod - max assist for squat pivot transfers or slide board transfers with min assist goals set for discharge.  *See Care Plan and progress notes for long and short-term goals.   Revisions to Treatment Plan:   Teaching Needs:   Current Barriers to Discharge: Decreased caregiver support and Home enviroment access/layout Daughter plans to seek ALF for patient and spouse  Possible Resolutions to Barriers: Family education    Medical Summary Current Status: Patient with recurrent CVA, left ACA distribution which occurred since rehabilitation admission.  Some improvement in upper extremity as well as lower extremity strength.  Chronic diarrhea some improvements on lower dose Lipitor.  Barriers to Discharge: Medical stability   Possible Resolutions to Barriers/Weekly Focus: Continue to monitor neurologic status given recent recurrent stroke, optimize  bowel program, may need toileting program for bladder.  Trial of oxybutynin for spastic bladder   Continued Need for Acute Rehabilitation Level of Care: The patient requires daily medical management by a physician with specialized training in physical medicine and rehabilitation for the following reasons: Direction of a multidisciplinary physical rehabilitation program to maximize functional independence : Yes Medical  management of patient stability for increased activity during participation in an intensive rehabilitation regime.: Yes Analysis of laboratory values and/or radiology reports with any subsequent need for medication adjustment and/or medical intervention. : Yes   I attest that I was present, lead the team conference, and concur with the assessment and plan of the team.   Dorien Chihuahua B 01/04/2021, 2:05 PM

## 2021-01-04 NOTE — Progress Notes (Signed)
Patient ID: [name] [facility], female   DOB: 01/21/1951, 70 y.o.   MRN: [id number] Team Conference Report to Patient/Family  Team Conference discussion was reviewed with the patient and caregiver, including goals, any changes in plan of care and target discharge date.  Patient and caregiver express understanding and are in agreement.  The patient has a target discharge date of 01/27/21.  [name] 01/04/2021, 1:59 PM

## 2021-01-04 NOTE — Progress Notes (Signed)
Patient on consult list to sign her AD. Phone number listed to call her daughter. Chaplain explained that we only have a notary on Fridays and will try to get AD completed on Friday.    01/04/21 1200  Clinical Encounter Type  Visited With Family  Visit Type Other (Comment)  Referral From Nurse  Consult/Referral To Chaplain  Spiritual Encounters  Spiritual Needs Literature  Stress Factors  Patient Stress Factors Health changes  Family Stress Factors Health changes

## 2021-01-04 NOTE — Progress Notes (Signed)
Physical Therapy Session Note  Patient Details  Name: [name] MRN: [id number] Date of Birth: [birth year]  Today's Date: 01/04/2021 PT Individual Time:  0915-1030 PT Individual Time Calculation (min): 75 min    Short Term Goals: Week 1:  PT Short Term Goal 1 (Week 1): pt will transfer to [facility] with mod assist consistently PT Short Term Goal 2 (Week 1): Pt will perform bed mobility with min assist PT Short Term Goal 3 (Week 1): Pt will ambulate 109ft with mod assist and BUE support on RW and +2 for safety PT Short Term Goal 4 (Week 1): Pt will propell WC 150ft with supervision assist Week 2:     Skilled Therapeutic Interventions/Progress Updates:  Patient supine in bed upon PT arrival. Patient alert and agreeable to PT session. Patient denied pain during session.  Therapeutic Activity: Bed Mobility: Patient performed supine to sit with Min A for BLE and Mod A this morning for pushing UB to upright seated position. Fwd and bwkd scooting on EOB with Max A initially and improving to Mod A with LUE at EOB. VC provided for forward lean and weight shift with coordinated BLE extension and lift.  Transfers: Patient performed sit to/from stand throughout session from w/c to RW and w/c to // bars. She requires up to Mod A for both with vc provided for forward lean, knee and hip extension, upright posture, correction of R lean. Stand at EOB performed for 59min and pt requires continued vc/tc for posture and extensor chain activation for upright stance. PT provided block to knee to prevent buckling, however pt demos ability to produce adequate knee extension ~60% of requests from PT.  Seated balance activity with focus on maintaining unsupported upright posture while performing reaching activities during functional activity. Maintains upright posture, midline positioning, and performs forward leans appropriately to complete all with no LOB and one instance of vc for upright posture.  Gait Training:   Patient ambulated 15 feet using RW with 2 person assist. Max A for RLE activation and advancement and 2nd person for upright posture into midlline.Pt is able to demonstrate mild hip flexor activation ~50 % of attempts for step forward with RLE.  Mirror present for pt to use in order to improve proprioception. Provided verbal cues for throughout for timing/ sequencing, hip flexor activation, glute and QL/ paraspinal activation for upright posture, and appropriate weight shifting. Pt is surprised at level of effort required to complete.   Neuromuscular Re-ed: NMR facilitated during session with focus on static and synamic standing baalnce. Pt guided in standing within // bars with focus on midline orientation, R quad activation, upright stance. Performed weight shifting with Mod A for maintaining R knee extension, step forward and back with RLE and minisquats.  NMR performed for improvements in motor control and coordination, balance, sequencing, judgement, and self confidence/ efficacy in performing all aspects of mobility.   Patient seated upright in w/c at end of session with brakes locked, belt alarm set, and all needs within reach.   Therapy Documentation Precautions:  Precautions Precautions: Fall Precaution Comments: decreased L visual field Restrictions Weight Bearing Restrictions: No    Therapy/Group: Individual Therapy  [name] 01/04/2021, 10:35 AM

## 2021-01-04 NOTE — Progress Notes (Signed)
Occupational Therapy Session Note  Patient Details  Name: [name] MRN: [id number] Date of Birth: 1951/01/08  Today's Date: 01/04/2021 OT Individual Time: 1130-1158 OT Individual Time Calculation (min): 28 min and 57 min    Short Term Goals: Week 1:  OT Short Term Goal 1 (Week 1): Pt will complete BSC/toilet transfer with mod A without lift. OT Short Term Goal 2 (Week 1): Pt will maintain static sitting balance to complete seated grooming unsupported with min A. OT Short Term Goal 3 (Week 1): Pt will don shirt with min A.  Skilled Therapeutic Interventions/Progress Updates:    Session 1 [phone]): Pt received in w/c, agreeable to therapy. Denies pain this session. Session focus on RUE NMR in prep for improved ADL performance. Squat-pivot from w/c > mat towards R side with mod A to block RLE + facilitate forward trunk flexion + lift and pivot hips. Completed 2x10 of the following with 1 lb dowel rod: bicep curls, forward/backward rows, B shoulder flexion and hold for 30 seconds, req overall min to mod A to facilitate R elbow flexion and support RUE against gravity. Squat-pivot back to w/c towards L with max A and transported back to room. Pt with noted inattention of RUE during functional transfers. Pt left in w/c with safety belt alarm engaged, call bell in reach, and all immediate needs met.   Session 2 [phone]): Pt received in gym/hand off from PT, agreeable to therapy. Denies pain but endorses fatigue this session. Session focus on RUE NMR/FMC in prep for improved functional use of RUE in ADL/IADL. WC<>mat<>bed transfer via slide board with overall mod A for BLE/BUE placement + to facilitate forward trunk flexion + to lift hips. Seated on mat, retrieved and placed cones with 2.5 lb weight placed on RUE in PNF flexion/extension pattern from head/knee height. Noted difficulty with voluntary release of cone but able to achieve sufficient shoulder flexion/abduction to place at head  height when not fatigued. Additionally stacked cones from outside BOS to facilitate forward trunk flexion. Finally, picked up and released poker chips into cup, req increased time to achieve pincer grasp and for voluntary release. Unable to achieve palm<>digit translation this session. Practiced flipping,stacking, and dealing cards during card game for improved R [place]. Back in room, completed sitting EOB > supine with min A to assist RLE onto bed and readjust trunk. Total A to doff B shoes. Pt left semi-reclined in bed with bed alarm engaged, call bell in reach, and all immediate needs met.    Therapy Documentation Precautions:  Precautions Precautions: Fall Precaution Comments: decreased L visual field Restrictions Weight Bearing Restrictions: No   Pain: See session note.   ADL: See Care Tool for more details.   Therapy/Group: Individual Therapy  Volanda Napoleon MS, OTR/L  01/04/2021, 7:53 AM

## 2021-01-04 NOTE — Progress Notes (Signed)
Physical Therapy Session Note  Patient Details  Name: [name] MRN: [id number] Date of Birth: 1950/12/07  Today's Date: 01/04/2021 PT Individual Time: 1300-1345 PT Individual Time Calculation (min): 45 min   Short Term Goals: Week 1:  PT Short Term Goal 1 (Week 1): pt will transfer to [facility] with mod assist consistently PT Short Term Goal 2 (Week 1): Pt will perform bed mobility with min assist PT Short Term Goal 3 (Week 1): Pt will ambulate 65ft with mod assist and BUE support on RW and +2 for safety PT Short Term Goal 4 (Week 1): Pt will propell WC 161ft with supervision assist  Skilled Therapeutic Interventions/Progress Updates:    Patient seated in w/c upon PT arrival. Patient alert and agreeable to PT session. No pain complaint throughout session.  Therapeutic Activity: Transfers: Patient performed scoot pivot transfer w/c <> mat table with Min/ Mod A and vc for hand placement and technique. Squat pivot performed in 2 trials w/c <> NuStep with similar vc including forward lean and weight shifting.   Neuromuscular Re-ed: NMR facilitated during session with focus on standing balance and posture as well as increasing muscle facilitation and activation of L knee muscle groups. Pt guided in standing bouts at // with pt able to see R side in mirror. She is able to visually recognize that she needs to straighten R knee for improved equal WB. Pt provides block to R knee for safety. Pt guided into 30 second stances over RLE with vc for quad muscle activation. Then progresses to lateral weight shifting to facilitate approximation and improved LLE proprioception. Completes 2 stances and maintains for ~3-25minutes for each. Pt then guided in seated foot slides into AAROM knee extension and flexion. She is able to initiate movements 50% of attempts and improves performance throughout. She does require gravity assistance to perform.  Pt then performs nuStep L2 x43minutes with focus on BLE only with  good activation in RLE and assist provided for maintaining alignment of RLE. NMR performed for improvements in motor control and coordination, balance, sequencing, judgement, and self confidence/ efficacy in performing all aspects of mobility at highest level of independence.   Patient seated in w/c at end of session with OT present to begin session.  Therapy Documentation Precautions:  Precautions Precautions: Fall Precaution Comments: decreased L visual field Restrictions Weight Bearing Restrictions: No  Therapy/Group: Individual Therapy  [name] 01/04/2021, 4:56 PM

## 2021-01-04 NOTE — Progress Notes (Addendum)
[place] PHYSICAL MEDICINE & REHABILITATION PROGRESS NOTE   Subjective/Complaints:  Stool freq daily x 2 d.Had 2 loose stools per nsg  Feels like urinary freq is slowing down, slept better last noc   ROS- neg CP, SOB, N/V/D,  Objective:   No results found. Recent Labs    01/02/21 0506  WBC 5.5  HGB 10.2*  HCT 30.2*  PLT 100*   No results for input(s): NA, K, CL, CO2, GLUCOSE, BUN, CREATININE, CALCIUM in the last 72 hours.  Intake/Output Summary (Last 24 hours) at 01/04/2021 0755 Last data filed at 01/03/2021 1815 Gross per 24 hour  Intake 277 ml  Output --  Net 277 ml        Physical Exam: Vital Signs Blood pressure 115/66, pulse 65, temperature 97.9 F (36.6 C), temperature source Oral, resp. rate 16, height 5\' 8"  (1.727 m), weight 83.4 kg, SpO2 96 %.   General: No acute distress Mood and affect are appropriate Heart: Regular rate and rhythm no rubs murmurs or extra sounds Lungs: Clear to auscultation, breathing unlabored, no rales or wheezes Abdomen: Positive bowel sounds, soft nontender to palpation, nondistended Extremities: No clubbing, cyanosis, or edema Skin: No evidence of breakdown, no evidence of rash   Skin: +open blister on lateral aspect of left foot with surrounding erythema, tender to palpation. No evidence of purulence Neurologic: Cranial nerves II through XII intact, motor strength is 5/5 in Left  deltoid, bicep, tricep, grip, hip flexor, knee extensors, ankle dorsiflexor and plantar flexor RUE 3- delt bi tri grip RLE trace hip add o/w 0/5 Sensory exam normal sensation to light touch and proprioception in bilateral upper and lower extremities  Musculoskeletal: Full range of motion in all 4 extremities. No joint swelling, neg impingement sign    Assessment/Plan: 1. Functional deficits which require 3+ hours per day of interdisciplinary therapy in a comprehensive inpatient rehab setting.  Physiatrist is providing close team supervision and 24  hour management of active medical problems listed below.  Physiatrist and rehab team continue to assess barriers to discharge/monitor patient progress toward functional and medical goals  Care Tool:  Bathing    Body parts bathed by patient: Right arm,Left arm,Chest,Abdomen,Front perineal area,Right upper leg,Left upper leg,Face   Body parts bathed by helper: Right lower leg,Left lower leg,Buttocks,Right arm,Left arm,Front perineal area     Bathing assist Assist Level: Maximal Assistance - Patient 24 - 49%     Upper Body Dressing/Undressing Upper body dressing   What is the patient wearing?: Pull over shirt    Upper body assist Assist Level: Moderate Assistance - Patient 50 - 74%    Lower Body Dressing/Undressing Lower body dressing      What is the patient wearing?: Pants,Incontinence brief     Lower body assist Assist for lower body dressing: Total Assistance - Patient < 25%     Toileting Toileting    Toileting assist Assist for toileting: Total Assistance - Patient < 25%     Transfers Chair/bed transfer  Transfers assist     Chair/bed transfer assist level: Maximal Assistance - Patient 25 - 49% (SB)     Locomotion Ambulation   Ambulation assist   Ambulation activity did not occur: Safety/medical concerns (motor control limitations; L knee pain with wt bearing)  Assist level: 2 helpers Assistive device: Other (comment) Max distance:  (Stedy)   Walk 10 feet activity   Assist  Walk 10 feet activity did not occur: Safety/medical concerns  Assist level: 2 helpers  Walk 50 feet activity   Assist Walk 50 feet with 2 turns activity did not occur: Safety/medical concerns         Walk 150 feet activity   Assist Walk 150 feet activity did not occur: N/A         Walk 10 feet on uneven surface  activity   Assist Walk 10 feet on uneven surfaces activity did not occur: Safety/medical concerns (motor control limitations)          Wheelchair     Assist Will patient use wheelchair at discharge?: Yes (Per PT long term goals) Type of Wheelchair: Manual Wheelchair activity did not occur: Safety/medical concerns (time constraints due to toileting)  Wheelchair assist level: Moderate Assistance - Patient 50 - 74% Max wheelchair distance: 50    Wheelchair 50 feet with 2 turns activity    Assist        Assist Level: Moderate Assistance - Patient 50 - 74%   Wheelchair 150 feet activity     Assist      Assist Level: Total Assistance - Patient < 25%   Blood pressure 115/66, pulse 65, temperature 97.9 F (36.6 C), temperature source Oral, resp. rate 16, height 5\' 8"  (1.727 m), weight 83.4 kg, SpO2 96 %.  Medical Problem List and Plan: 1.Right leg weaknesssecondary to small acute infarct within the anterior right frontal lobe. Multiple old infarcts and chronic small vessel ischemia. New RIght ACA infarct on IV hep per neuro , will be seen by stroke team in am and hopefully can transition to warfarin  -patient may  shower -ELOS/Goals: 14-18d  Continue CIR, team conf in am  2. Antithrombotics: -DVT/anticoagulation:Chronic Coumadin therapy. Continue Lovenox till INR therapeutic 2.5-3.5 1/31: INR reviewed and is not yet therapeutic at 1.4 -antiplatelet therapy: Aspirin 81 mg daily 3. Pain Management:Tylenol as needed shoulder pain on RIght add Kpad , sportscream 4. Mood:Aricept 10 mg daily, Lexapro 20 mg daily -antipsychotic agents: N/A 5. Neuropsych: This patientiscapable of making decisions on herown behalf. 6. Skin/Wound Care:Routine skin checks 7. Fluids/Electrolytes/Nutrition:Routine in and outs with follow-up chemistries 8. Antiphospholipid antibody syndrome/thrombocytopenia. Follow-up hematology services. Follow-up CBC. now on Eliquis per neuro   1/31: PC reviewed, improved to 100.  9. Hypertension. Monitor with increased  mobility     Vitals:   12/30/20 0310 12/30/20 0412  BP: 121/73 119/70  Pulse: 68 67  Resp: 16 16  Temp: 98 F (36.7 C) 98.1 F (36.7 C)  SpO2: 100% 97%  Controlled, continue Cozaar 25mg  daily 10. Hypothyroidism. Synthroid 11. Hyperlipidemia. Lipitor 12. CKD stage III. Follow-up chemistries 13. Diarrhea. Patient was treated for loose stools daily for the past few weeks. Plan outpatient follow-up GI. Basic stool studies including GI pathogen panel neg, C diff neg, discussed with daughter that now is not a good time to proceed with endoscopy due to recent CVA and anticoagulation  Continue Imodium as needed Reduced lipitor may be helping , also on Aricept since 11/29 this may also cough diarrhea , will hold and monitor  #14.  Irregular heart rhythm EKG shows occasional PVC no treatment needed  15.  Spastic bladder due to CVA UA shows some WBC but urine cath , contaminated specimen , would reculture if dysuria occurs or fever Trial oxybutnin- seems to be helping reduce frequency  LOS: 6 days A FACE TO FACE EVALUATION WAS PERFORMED  Charlett Blake 01/04/2021, 7:55 AM

## 2021-01-05 DIAGNOSIS — I6612 Occlusion and stenosis of left anterior cerebral artery: Secondary | ICD-10-CM

## 2021-01-05 NOTE — Plan of Care (Signed)
  Problem: Consults Goal: RH STROKE PATIENT EDUCATION Description: See Patient Education module for education specifics  Outcome: Progressing Goal: Nutrition Consult-if indicated Outcome: Progressing   Problem: RH BOWEL ELIMINATION Goal: RH STG MANAGE BOWEL WITH ASSISTANCE Description: STG Manage Bowel with mod I Assistance. Outcome: Progressing Goal: RH STG MANAGE BOWEL W/MEDICATION W/ASSISTANCE Description: STG Manage Bowel with Medication with mod I Assistance. Outcome: Progressing   Problem: RH BLADDER ELIMINATION Goal: RH STG MANAGE BLADDER WITH ASSISTANCE Description: STG Manage Bladder With mod I Assistance Outcome: Progressing   Problem: RH SKIN INTEGRITY Goal: RH STG SKIN FREE OF INFECTION/BREAKDOWN Description: Supervision Outcome: Progressing Goal: RH STG MAINTAIN SKIN INTEGRITY WITH ASSISTANCE Description: STG Maintain Skin Integrity With mod I Assistance. Outcome: Progressing   Problem: RH SAFETY Goal: RH STG ADHERE TO SAFETY PRECAUTIONS W/ASSISTANCE/DEVICE Description: STG Adhere to Safety Precautions With supervision Assistance/Device. Outcome: Progressing   Problem: RH COGNITION-NURSING Goal: RH STG ANTICIPATES NEEDS/CALLS FOR ASSIST W/ASSIST/CUES Description: STG Anticipates Needs/Calls for Assist With Assistance/Cues. Outcome: Progressing   Problem: RH PAIN MANAGEMENT Goal: RH STG PAIN MANAGED AT OR BELOW PT'S PAIN GOAL Description: Pain scale <4/10 Outcome: Progressing   Problem: RH KNOWLEDGE DEFICIT Goal: RH STG INCREASE KNOWLEDGE OF HYPERTENSION Description: Patient and family will be able to manage HTN with medication and dietary modifications using handouts and educational materials independently.  Outcome: Progressing Goal: RH STG INCREASE KNOWLEGDE OF HYPERLIPIDEMIA Description: Patient and family will be able to manage HLD with medication and dietary modifications using handouts and educational materials independently.  Outcome:  Progressing Goal: RH STG INCREASE KNOWLEDGE OF STROKE PROPHYLAXIS Description: Patient and family will be able to manage secondary stroke risks with medication and dietary modifications using handouts and educational materials independently.  Outcome: Progressing

## 2021-01-05 NOTE — Evaluation (Signed)
Recreational Therapy Assessment and Plan  Patient Details  Name: [name] MRN: [id number] Date of Birth: 1951-04-17 Today's Date: 01/05/2021  Rehab Potential:  Good ELOS:   d/c 2/25  Assessment  Hospital Problem: Active Problems:   Ischemic cerebrovascular accident (CVA) of frontal lobe [facility])   Past Medical History:      Past Medical History:  Diagnosis Date  . Arthritis 04/02/1996  . Depression   . GERD (gastroesophageal reflux disease) 04/03/2019  . Hyperlipidemia   . Hypertension   . Renal insufficiency   . Stroke ([place])   . Thyroid disease    Past Surgical History:       Past Surgical History:  Procedure Laterality Date  . CESAREAN SECTION    . CHOLECYSTECTOMY    . COLONOSCOPY  2012   repeat in 10 yrs  . KNEE ARTHROSCOPY W/ MENISCAL REPAIR Left     Assessment & Plan Clinical Impression: Patient is a 70 year old right-handed female with history of CVA 2004 with residual left lateral visual field deficits, history of DVT and antiphospholipid antibody syndrome diagnosed 2004 followed by Dr. Otilio Miu maintained on chronic Coumadin, CKD stage III, hypertension, hyperlipidemia, hypothyroidism and mild cognitive impairments maintained on Aricept. Per chart review patient lives with spouse. Multilevel home bed and bath main level 5 steps to entry. Husband is limited physically to assist as personal care attendance and currently is admitted to [facility] as a renal transplant. They do have a daughter here visiting from [place] at present. Patient reportedly independent prior to admission. Presented 12/26/2020 with acute onset of right leg weakness. Cranial CT scan showed no acute findings. Multiple old infarcts in the right occipital lobe at the right parietal vertex and at the left frontal convexity as well as chronic small vessel ischemic changes in the white matter. Patient did not receive TPA. MRI showed small acute infarct within the  anterior right frontal lobe. No hemorrhage or mass-effect. Distal occlusion of the right PCA likely chronic given old PCA territory infarction. Moderate stenosis of the left P4 segment. Patient transferred to CIR on 12/29/2020 .   Pt presents with decreased activity tolerance, decreased functional mobility, decreased balance, decreased coordination , decreased vision, decreased attention, decreased awareness, decreased problem solving, decreased safety awareness, decreased memory and delayed processing Limiting pt's independence with leisure/community pursuits.   Plan  Min 1 TR session >20 minutes per week during LOS  Recommendations for other services: Neuropsych  Discharge Criteria: Patient will be discharged from TR if patient refuses treatment 3 consecutive times without medical reason.  If treatment goals not met, if there is a change in medical status, if patient makes no progress towards goals or if patient is discharged from hospital.  The above assessment, treatment plan, treatment alternatives and goals were discussed and mutually agreed upon: by patient  [place] 01/05/2021, 8:48 AM

## 2021-01-05 NOTE — Progress Notes (Signed)
Occupational Therapy Session Note  Patient Details  Name: [name] MRN: [id number] Date of Birth: 03-May-1951  Today's Date: 01/05/2021 OT Individual Time: 1419-1533 OT Individual Time Calculation (min): 74 min    Short Term Goals: Week 1:  OT Short Term Goal 1 (Week 1): Pt will complete BSC/toilet transfer with mod A without lift. OT Short Term Goal 2 (Week 1): Pt will maintain static sitting balance to complete seated grooming unsupported with min A. OT Short Term Goal 3 (Week 1): Pt will don shirt with min A.  Skilled Therapeutic Interventions/Progress Updates:    Pt received in w/c, agreeable to therapy/requesting to shower. Stedy transfer to [place] in shower, min A to power up in stedy. Bathed UB with close S, bathed LB with mod A to reach BLE. STS in stedy to reach buttocks. Stedy transfer to w/c. Doffed/donned shirt with min A to pull over head, doffed bra with close S, donned bra with mod A to thread BUE, total A to button front clasp. Doffed/donned incont brief/pants with total A, dep in stedy for STS. Multiple attempts for STS with RW, unable to come into fully erect posture. STS with no AD and mod A for therapist to come into fully erect posture + block R knee. Doffed/donned B socks/shoes with total A. Dried hair with total A 2/2 time. Pt overall demonstrated improved static sitting balance this date and improved functional use of RUE, cont to demonstrate limited carry over of technique to facilitate forward trunk flexion + push up from arms rests to scoot self back in w/c, req max Vcs for sequencing.  Pt left seated in w/c with safety alarm engaged, call bell in reach, and all immediate needs met.    Therapy Documentation Precautions:  Precautions Precautions: Fall Precaution Comments: decreased L visual field Restrictions Weight Bearing Restrictions: No Pain: Pain Assessment Pain Scale: 0-10 Pain Score: 0-No pain ADL: See Care Tool for more  details.   Therapy/Group: Individual Therapy  Volanda Napoleon MS, OTR/L  01/05/2021, 3:56 PM

## 2021-01-05 NOTE — Progress Notes (Signed)
[place] PHYSICAL MEDICINE & REHABILITATION PROGRESS NOTE   Subjective/Complaints:  No issues overnight , feels stronger on the right side, discussed diarrhea and start date of aricept, pt called neuro office to report diarrhea but did not get a call back  ROS- neg CP, SOB, N/V/D,  Objective:   No results found. No results for input(s): WBC, HGB, HCT, PLT in the last 72 hours. No results for input(s): NA, K, CL, CO2, GLUCOSE, BUN, CREATININE, CALCIUM in the last 72 hours.  Intake/Output Summary (Last 24 hours) at 01/05/2021 0804 Last data filed at 01/04/2021 1805 Gross per 24 hour  Intake 600 ml  Output 200 ml  Net 400 ml        Physical Exam: Vital Signs Blood pressure (!) 116/59, pulse 68, temperature 97.7 F (36.5 C), resp. rate 17, height 5\' 8"  (1.727 m), weight 83.4 kg, SpO2 99 %.   General: No acute distress Mood and affect are appropriate Heart: Regular rate and rhythm no rubs murmurs or extra sounds Lungs: Clear to auscultation, breathing unlabored, no rales or wheezes Abdomen: Positive bowel sounds, soft nontender to palpation, nondistended Extremities: No clubbing, cyanosis, or edema  Skin: +open blister on lateral aspect of left foot with surrounding erythema, tender to palpation. No evidence of purulence Neurologic: Cranial nerves II through XII intact, motor strength is 5/5 in Left  deltoid, bicep, tricep, grip, hip flexor, knee extensors, ankle dorsiflexor and plantar flexor RUE 3- delt bi tri grip RLE trace hip add o/w 0/5 Sensory exam normal sensation to light touch and proprioception in bilateral upper and lower extremities  Musculoskeletal: Full range of motion in all 4 extremities. No joint swelling, neg impingement sign    Assessment/Plan: 1. Functional deficits which require 3+ hours per day of interdisciplinary therapy in a comprehensive inpatient rehab setting.  Physiatrist is providing close team supervision and 24 hour management of active  medical problems listed below.  Physiatrist and rehab team continue to assess barriers to discharge/monitor patient progress toward functional and medical goals  Care Tool:  Bathing    Body parts bathed by patient: Right arm,Left arm,Chest,Abdomen,Front perineal area,Right upper leg,Left upper leg,Face   Body parts bathed by helper: Right lower leg,Left lower leg,Buttocks,Right arm,Left arm,Front perineal area     Bathing assist Assist Level: Maximal Assistance - Patient 24 - 49%     Upper Body Dressing/Undressing Upper body dressing   What is the patient wearing?: Pull over shirt    Upper body assist Assist Level: Moderate Assistance - Patient 50 - 74%    Lower Body Dressing/Undressing Lower body dressing      What is the patient wearing?: Pants,Incontinence brief     Lower body assist Assist for lower body dressing: Total Assistance - Patient < 25%     Toileting Toileting    Toileting assist Assist for toileting: Total Assistance - Patient < 25%     Transfers Chair/bed transfer  Transfers assist     Chair/bed transfer assist level: Moderate Assistance - Patient 50 - 74%     Locomotion Ambulation   Ambulation assist   Ambulation activity did not occur: Safety/medical concerns (motor control limitations; L knee pain with wt bearing)  Assist level: 2 helpers Assistive device: Walker-rolling Max distance: 15 ft   Walk 10 feet activity   Assist  Walk 10 feet activity did not occur: Safety/medical concerns  Assist level: 2 helpers Assistive device: Walker-rolling   Walk 50 feet activity   Assist Walk 50 feet with  2 turns activity did not occur: Safety/medical concerns         Walk 150 feet activity   Assist Walk 150 feet activity did not occur: N/A         Walk 10 feet on uneven surface  activity   Assist Walk 10 feet on uneven surfaces activity did not occur: Safety/medical concerns (motor control limitations)          Wheelchair     Assist Will patient use wheelchair at discharge?: Yes (Per PT long term goals) Type of Wheelchair: Manual Wheelchair activity did not occur: Safety/medical concerns (time constraints due to toileting)  Wheelchair assist level: Moderate Assistance - Patient 50 - 74% Max wheelchair distance: 50    Wheelchair 50 feet with 2 turns activity    Assist        Assist Level: Moderate Assistance - Patient 50 - 74%   Wheelchair 150 feet activity     Assist      Assist Level: Total Assistance - Patient < 25%   Blood pressure (!) 116/59, pulse 68, temperature 97.7 F (36.5 C), resp. rate 17, height 5\' 8"  (1.727 m), weight 83.4 kg, SpO2 99 %.  Medical Problem List and Plan: 1.Right leg weaknesssecondary to small acute infarct within the anterior right frontal lobe. Multiple old infarcts and chronic small vessel ischemia. New RIght ACA infarct on IV hep per neuro , will be seen by stroke team in am and hopefully can transition to warfarin  -patient may  shower -ELOS/Goals: 14-18d   2. Antithrombotics: -DVT/anticoagulation:Chronic Coumadin therapy. Continue Lovenox till INR therapeutic 2.5-3.5 1/31: INR reviewed and is not yet therapeutic at 1.4 -antiplatelet therapy: Aspirin 81 mg daily 3. Pain Management:Tylenol as needed shoulder pain on RIght add Kpad , sportscream 4. Mood:Aricept 10 mg daily, Lexapro 20 mg daily -antipsychotic agents: N/A 5. Neuropsych: This patientiscapable of making decisions on herown behalf. 6. Skin/Wound Care:Routine skin checks 7. Fluids/Electrolytes/Nutrition:Routine in and outs with follow-up chemistries 8. Antiphospholipid antibody syndrome/thrombocytopenia. Follow-up hematology services. Follow-up CBC. now on Eliquis per neuro   1/31: PC reviewed, improved to 100.  9. Hypertension. Monitor with increased mobility     Vitals:   12/30/20 0310 12/30/20 0412   BP: 121/73 119/70  Pulse: 68 67  Resp: 16 16  Temp: 98 F (36.7 C) 98.1 F (36.7 C)  SpO2: 100% 97%  Controlled, continue Cozaar 25mg  daily 10. Hypothyroidism. Synthroid 11. Hyperlipidemia. Lipitor 12. CKD stage III. Follow-up chemistries 13. Diarrhea. Patient was treated for loose stools daily for the past few weeks. Plan outpatient follow-up GI. Basic stool studies including GI pathogen panel neg, C diff neg, discussed with daughter that now is not a good time to proceed with endoscopy due to recent CVA and anticoagulation  Continue Imodium as needed Reduced lipitor may be helping , also on Aricept since 11/29 this may also cough diarrhea , will hold and monitor  #14.  Irregular heart rhythm EKG shows occasional PVC no treatment needed  15.  Spastic bladder due to CVA UA shows some WBC but urine cath , contaminated specimen , would reculture if dysuria occurs or fever Trial oxybutnin- seems to be helping reduce frequency  LOS: 7 days A FACE TO [place] E Aleila Syverson 01/05/2021, 8:04 AM

## 2021-01-05 NOTE — Progress Notes (Signed)
Physical Therapy Session Note  Patient Details  Name: [name] MRN: [id number] Date of Birth: 11-08-1951  Today's Date: 01/05/2021 PT Individual Time: 0920-1002 PT Individual Time Calculation (min): 42 min   Short Term Goals: Week 1:  PT Short Term Goal 1 (Week 1): pt will transfer to [facility] with mod assist consistently PT Short Term Goal 2 (Week 1): Pt will perform bed mobility with min assist PT Short Term Goal 3 (Week 1): Pt will ambulate 72ft with mod assist and BUE support on RW and +2 for safety PT Short Term Goal 4 (Week 1): Pt will propell WC 164ft with supervision assist  Skilled Therapeutic Interventions/Progress Updates:    Pt received sitting in w/c with her daughter arriving and pt agreeable to therapy session. Reports some fatigue from previous PT session but eager to participate. Donned socks and shoes total assist for time management. Transported to/from gym in w/c for time management and energy conservation. R squat pivot transfer w/c>EOM having pt hold onto therapist with L UE to force increased BLE (R specifically) WBing and more normalized movements with cuing for head/hips relationship. Repeated sit<>stands to/from EOM, no UE support, mirror feedback for midline orientation from elevated mat table min/mod assist for lifting/balance facilitating R LE hip/knee extension and guarding R knee buckle with pt demoing lack of terminal extension without cuing but then with activation has some hyperextension. Progressed to R LE NMR targeting stance phase via L LE stepping forward/backwards without UE support and mirror feedback for weight shifting - min/mod assist for balance and therapist blocking R knee and facilitating increased hip extension. Pt reports feeling dehydrated - provided water during session. Pt reports need to use bathroom. L squat pivot EOM>w/c same set-up as above with continued cuing for proper head/hips relationship and mod assist for lifting/pivoting hips.  Transported back to room. R stand pivot w/c>BSC over toilet using UE support on grab bar with mod assist and therapist manually facilitating stepping and repositioning of R LE. Standing with mod assist for balance and limited UE support during total assist LB clothing management. Pt left sitting on BSC over toilet with NT present to assume care of patient.  Therapy Documentation Precautions:  Precautions Precautions: Fall Precaution Comments: decreased L visual field Restrictions Weight Bearing Restrictions: No  Pain:   Reports sensitivity to a scar located above medial malleolus. One time reported some L knee "twinge" but with repositioning no further reports of pain.  Therapy/Group: Individual Therapy  Tawana Scale , PT, DPT, CSRS  01/05/2021, 7:50 AM

## 2021-01-05 NOTE — Progress Notes (Signed)
Physical Therapy Session Note  Patient Details  Name: [name] MRN: [id number] Date of Birth: 04/28/1951  Today's Date: 01/05/2021 PT Individual Time: 1600-1635 PT Individual Time Calculation (min): 35 min   Short Term Goals: Week 1:  PT Short Term Goal 1 (Week 1): pt will transfer to [facility] with mod assist consistently PT Short Term Goal 2 (Week 1): Pt will perform bed mobility with min assist PT Short Term Goal 3 (Week 1): Pt will ambulate 16f with mod assist and BUE support on RW and +2 for safety PT Short Term Goal 4 (Week 1): Pt will propell WC 1018fwith supervision assist  Skilled Therapeutic Interventions/Progress Updates:   Pt received sitting in WC and agreeable to PT. Pt transported to rehab gym. Sit<>stand transfer training with mod assist x 8 throughout sessoin. Min cues for proper UE placement to push up from [facility] improved awareness of the RLE. Pt attempted to perform standing hip abduction, but unable to adequately  off load RLE with weight to the L to lift RLE.   Gait training with RW 2 x 2075fith max assist to advance the RLE and improve weight shift to the L. No knee buckling on the R throughout gait training. Pt demonstrating mild pusher behavior when PT facilitating full WB on the LLE. PT applied toe cap with shoe cover to allow limb advancement on the R side.   Pt returned to room and performed SB transfer to bed with min assist and mod cues for sequencing. Sit>supine completed with min assist for RLE management, and left supine in bed with call bell in reach and all needs met.        Therapy Documentation Precautions:  Precautions Precautions: Fall Precaution Comments: decreased L visual field Restrictions Weight Bearing Restrictions: No Vital Signs: Therapy Vitals Temp: 97.9 F (36.6 C) Temp Source: Oral Pulse Rate: 79 Resp: 17 BP: 122/82 Patient Position (if appropriate): Sitting Oxygen Therapy SpO2: 100 % O2 Device: Room Air Pain: Pain  Assessment Pain Scale: 0-10 Pain Score: 0-No pain    Therapy/Group: Individual Therapy  AusLorie Phenix3/2022, 4:43 PM

## 2021-01-05 NOTE — Progress Notes (Signed)
Chaplain visited with patient's daughter today to set a time for patient to sign her AD documents. We are trying for tomorrow (01/06/21) and if not, then next Friday. Daughter will call Chaplain pager.

## 2021-01-05 NOTE — Progress Notes (Addendum)
Physical Therapy Session Note  Patient Details  Name: [name] MRN: [id number] Date of Birth: June 18, 1951  Today's Date: 01/05/2021 PT Individual Time: 0800-0910 PT Individual Time Calculation (min): 70 min   Short Term Goals: Week 1:  PT Short Term Goal 1 (Week 1): pt will transfer to [facility] with mod assist consistently PT Short Term Goal 2 (Week 1): Pt will perform bed mobility with min assist PT Short Term Goal 3 (Week 1): Pt will ambulate 41f with mod assist and BUE support on RW and +2 for safety PT Short Term Goal 4 (Week 1): Pt will propell WC 1032fwith supervision assist  Skilled Therapeutic Interventions/Progress Updates:   Pt received supine in bed and agreeable to PT. Dressing at bed level with max assist. Supine>sit transfer with mod assist and cues for awareenss of the RLE/RUE.   [facility] session with mod assist overall to facilitate anterior weight shift and improve sequencing of movement and limit forward slide on slide board.  Bed level NMR:  Supine<>sit with min-mod assist from PT for safety and improved activation of RLE. Rolling L with focus on reaching with RUE/pushing through LLE to improve control trunk and pelvis. X 5 with min cues for increased use of LLE, x 6 with added resistance to pelvis to force use improved motor recruitment.  Powder board: Hip/knee flexion/extension in symmetry with trace acitvation in both directions 2x10. Knee flexion/extension with mod-max assist throughout and only trace activation into extension.   Bridging 2 x 8 with mod assist on first bout to stabilize the RLE and min assist on the second for RLE stability. Improving stability of the R hip with increased repetitions.   Supine>prone x 2 with min assist for improved positioning of the RLE. Attempted to press into qped, but unsuccessful due to extensor tone on the RLE>   Standing NMR: forward/reverse stepping with BLE x 10 each with intermittent  blocking of the RLE. Max assist to advance/return RLE. Gait in parallel bars with max assist to advance the RLE forward/reverse x 10.   [facility] returned to room and [facility] and all needs met.           Therapy Documentation Precautions:  Precautions Precautions: Fall Precaution Comments: decreased L visual field Restrictions Weight Bearing Restrictions: No Pain: Pain Assessment Pain Scale: 0-10 Pain Score: 4  Pain Intervention(s): Medication (See eMAR)    Therapy/Group: Individual Therapy  AuLorie Phenix/01/2021, 9:21 AM

## 2021-01-06 DIAGNOSIS — I6612 Occlusion and stenosis of left anterior cerebral artery: Secondary | ICD-10-CM | POA: Diagnosis not present

## 2021-01-06 MED ORDER — OXYBUTYNIN CHLORIDE 5 MG PO TABS
5.0000 mg | ORAL_TABLET | Freq: Three times a day (TID) | ORAL | Status: DC
  Administered 2021-01-06 – 2021-01-17 (×30): 5 mg via ORAL
  Filled 2021-01-06 (×30): qty 1

## 2021-01-06 NOTE — Progress Notes (Signed)
   01/06/21 1000  Clinical Encounter Type  Visited With Patient and family together  Visit Type Follow-up  Referral From Patient  Consult/Referral To Chaplain  Spiritual Encounters  Spiritual Needs Other (Comment) (AD)  The chaplain followed up on an AD request made yesterday. The patient desired the notary to complete her personal POA. The chaplain informed them that the [facility] notary can only notarize our HCPOA documentation. The patient and her daughter Ramiro Harvest will have a travel notary come visit the patient with two witnesses. I spoke with the Nursing Director, Verdis Frederickson, and the patient can have the notary and two witnesses come to the hospital. The daughter will notify the unit of the names of the notary and witnesses. The patient requested Moses Cones's paperwork for POA. The chaplain provided the paperwork and will follow up as needed for notary services.

## 2021-01-06 NOTE — Progress Notes (Signed)
   01/06/21 1100  Clinical Encounter Type  Visited With Patient and family together  Visit Type Follow-up  Referral From Patient;Family  Consult/Referral To Chaplain  The chaplain went to the patient's room to complete the AD with a notary and two witnesses.

## 2021-01-06 NOTE — Progress Notes (Signed)
[place] PHYSICAL MEDICINE & REHABILITATION PROGRESS NOTE   Subjective/Complaints: Seen working with physical therapy, no new complaints today.  Bladder still is fairly frequent, 7 times documented yesterday had 1 incontinent episode.  Has some pain in the left quad. 2 BM documented yesterday   ROS- neg CP, SOB, N/V/D,  Objective:   No results found. No results for input(s): WBC, HGB, HCT, PLT in the last 72 hours. No results for input(s): NA, K, CL, CO2, GLUCOSE, BUN, CREATININE, CALCIUM in the last 72 hours.  Intake/Output Summary (Last 24 hours) at 01/06/2021 0755 Last data filed at 01/06/2021 0730 Gross per 24 hour  Intake 533 ml  Output 525 ml  Net 8 ml        Physical Exam: Vital Signs Blood pressure 113/66, pulse 79, temperature 98 F (36.7 C), resp. rate 16, height 5\' 8"  (1.727 m), weight 83.4 kg, SpO2 97 %.   General: No acute distress Mood and affect are appropriate Heart: Regular rate and rhythm no rubs murmurs or extra sounds Lungs: Clear to auscultation, breathing unlabored, no rales or wheezes Abdomen: Positive bowel sounds, soft nontender to palpation, nondistended Extremities: No clubbing, cyanosis, or edema, left quad mildly tender to palpation no pain with knee range of motion no evidence of knee effusion or tenderness  Skin: +open blister on lateral aspect of left foot with surrounding erythema, tender to palpation. No evidence of purulence Neurologic: Cranial nerves II through XII intact, motor strength is 5/5 in Left  deltoid, bicep, tricep, grip, hip flexor, knee extensors, ankle dorsiflexor and plantar flexor RUE 3- delt bi tri grip, finger extensors RLE 3 minus at the knee extensor, 0 at the right hip flexor Sensory exam normal sensation to light touch and proprioception in bilateral upper and lower extremities  Musculoskeletal: Full range of motion in all 4 extremities. No joint swelling, neg impingement sign    Assessment/Plan: 1. Functional  deficits which require 3+ hours per day of interdisciplinary therapy in a comprehensive inpatient rehab setting.  Physiatrist is providing close team supervision and 24 hour management of active medical problems listed below.  Physiatrist and rehab team continue to assess barriers to discharge/monitor patient progress toward functional and medical goals  Care Tool:  Bathing    Body parts bathed by patient: Right arm,Left arm,Chest,Abdomen,Front perineal area,Right upper leg,Left upper leg,Face   Body parts bathed by helper: Right lower leg,Left lower leg,Buttocks     Bathing assist Assist Level: Dependent - Patient 0% (STS in stedy to reach buttocks)     Upper Body Dressing/Undressing Upper body dressing   What is the patient wearing?: Pull over shirt,Bra    Upper body assist Assist Level: Moderate Assistance - Patient 50 - 74%    Lower Body Dressing/Undressing Lower body dressing      What is the patient wearing?: Pants,Incontinence brief     Lower body assist Assist for lower body dressing: Dependent - Patient 0% (stedy for STS)     Toileting Toileting    Toileting assist Assist for toileting: Total Assistance - Patient < 25%     Transfers Chair/bed transfer  Transfers assist     Chair/bed transfer assist level: Moderate Assistance - Patient 50 - 74% (squat pivot)     Locomotion Ambulation   Ambulation assist   Ambulation activity did not occur: Safety/medical concerns (motor control limitations; L knee pain with wt bearing)  Assist level: 2 helpers Assistive device: Walker-rolling Max distance: 15 ft   Walk 10 feet activity  Assist  Walk 10 feet activity did not occur: Safety/medical concerns  Assist level: 2 helpers Assistive device: Walker-rolling   Walk 50 feet activity   Assist Walk 50 feet with 2 turns activity did not occur: Safety/medical concerns         Walk 150 feet activity   Assist Walk 150 feet activity did not occur:  N/A         Walk 10 feet on uneven surface  activity   Assist Walk 10 feet on uneven surfaces activity did not occur: Safety/medical concerns (motor control limitations)         Wheelchair     Assist Will patient use wheelchair at discharge?: Yes (Per PT long term goals) Type of Wheelchair: Manual Wheelchair activity did not occur: Safety/medical concerns (time constraints due to toileting)  Wheelchair assist level: Moderate Assistance - Patient 50 - 74% Max wheelchair distance: 50    Wheelchair 50 feet with 2 turns activity    Assist        Assist Level: Moderate Assistance - Patient 50 - 74%   Wheelchair 150 feet activity     Assist      Assist Level: Total Assistance - Patient < 25%   Blood pressure 113/66, pulse 79, temperature 98 F (36.7 C), resp. rate 16, height 5\' 8"  (1.727 m), weight 83.4 kg, SpO2 97 %.  Medical Problem List and Plan: 1.Right leg weaknesssecondary to small acute infarct within the anterior right frontal lobe. Multiple old infarcts and chronic small vessel ischemia. New RIght ACA infarct on IV hep per neuro , will be seen by stroke team in am and hopefully can transition to warfarin  -patient may  shower -ELOS/Goals: 14-18d   2. Antithrombotics: -DVT/anticoagulation:Chronic Coumadin therapy. Continue Lovenox till INR therapeutic 2.5-3.5 1/31: INR reviewed and is not yet therapeutic at 1.4 -antiplatelet therapy: Aspirin 81 mg daily 3. Pain Management:Tylenol as needed thigh pain left likely muscular pain from exercise  sportscream 4. Mood:Aricept 10 mg daily, Lexapro 20 mg daily -antipsychotic agents: N/A 5. Neuropsych: This patientiscapable of making decisions on herown behalf. 6. Skin/Wound Care:Routine skin checks 7. Fluids/Electrolytes/Nutrition:Routine in and outs with follow-up chemistries 8. Antiphospholipid antibody syndrome/thrombocytopenia. Follow-up  hematology services. Follow-up CBC. now on Eliquis per neuro   1/31: PC reviewed, improved to 100.  9. Hypertension. Monitor with increased mobility     Vitals:   12/30/20 0310 12/30/20 0412  BP: 121/73 119/70  Pulse: 68 67  Resp: 16 16  Temp: 98 F (36.7 C) 98.1 F (36.7 C)  SpO2: 100% 97%  Controlled, continue Cozaar 25mg  daily 10. Hypothyroidism. Synthroid 11. Hyperlipidemia. Lipitor 12. CKD stage III. Follow-up chemistries 13. Diarrhea. Patient was treated for loose stools daily for the past few weeks. Plan outpatient follow-up GI. Basic stool studies including GI pathogen panel neg, C diff neg, discussed with daughter that now is not a good time to proceed with endoscopy due to recent CVA and anticoagulation  Continue Imodium as needed Reduced lipitor may be helping , also on Aricept since 11/29 this may also cough diarrhea , will hold and monitor  #14.  Irregular heart rhythm EKG shows occasional PVC no treatment needed  15.  Spastic bladder due to CVA UA shows some WBC but urine cath , contaminated specimen , would reculture if dysuria occurs or fever Trial oxybutnin- seems to be helping reduce frequency, increase to 5 mg 3 times daily LOS: 8 days A FACE TO FACE EVALUATION WAS PERFORMED  Luanna Salk Aneesa Romey  01/06/2021, 7:55 AM

## 2021-01-06 NOTE — Progress Notes (Signed)
Occupational Therapy Session Note  Patient Details  Name: [name] MRN: [id number] Date of Birth: 1951/01/26  Today's Date: 01/06/2021 OT Individual Time: 5883-2549 OT Individual Time Calculation (min): 71 min    Short Term Goals: Week 1:  OT Short Term Goal 1 (Week 1): Pt will complete BSC/toilet transfer with mod A without lift. OT Short Term Goal 2 (Week 1): Pt will maintain static sitting balance to complete seated grooming unsupported with min A. OT Short Term Goal 3 (Week 1): Pt will don shirt with min A.  Skilled Therapeutic Interventions/Progress Updates:    Pt received semi-reclined in bed, agreeable to therapy. Session focus on RUE NMR in prep for improved ADL/IADL performance. Roll > R with close S, side lying > sitting EOB with mod A to lift trunk + bring BLE off bed. Total A to don B shoes. SB transfer > w/c with mod A to lift pelvis + BUE/BLE placement. Completed the following game on BITS with RUE, with 1 lb wrist weight: tracing symbols with ~20% out of the line.   Game: Single Target, User Paced Accuracy: 36.9% Reaction Time: 3.85 Duration Time: 2 min Hits: 31 Accuracy: 50.77% Reaction Time: 3.55 Duration Time: 2 min Hits: 33 Accuracy: 55.32% Reaction Time: 4.54 Duration Time: 2 min Hits: 26  Initial difficulty isolating index finger to tap target/locate target on L side 2/2 baseline field cut, self-corrected after mod VCs for improved accuracy/ to use light house method scanning/4 corners scanning technique.   Stood in standing frame for 15 min while completing [place] with cards before fatiguing, c/o of LLE pain "my L leg is locking up." Mod RPE reported at 6/10. Finally, placed resistive clothes pins at 4th level/blue resistance with mod A to initially achieve 3 jaw chuck grasp to place at head height.  Pt left in w/c with NT present call bell in reach, and all immediate needs met.    Therapy Documentation Precautions:  Precautions Precautions:  Fall Precaution Comments: decreased L visual field Restrictions Weight Bearing Restrictions: Yes Pain: Pain Assessment Pain Scale: Faces Faces Pain Scale: Hurts a little bit Pain Type: Chronic pain Pain Location: Leg Pain Orientation: Left ADL: See Care Tool for more details.   Therapy/Group: Individual Therapy  Volanda Napoleon MS, OTR/L  01/06/2021, 3:36 PM

## 2021-01-06 NOTE — Progress Notes (Signed)
Physical Therapy Session Note  Patient Details  Name: [name] MRN: [id number] Date of Birth: 12-29-1950  Today's Date: 01/06/2021 PT Individual Time: 0810-0850 PT Individual Time Calculation (min): 40 min   Short Term Goals: Week 1:  PT Short Term Goal 1 (Week 1): pt will transfer to [facility] with mod assist consistently PT Short Term Goal 2 (Week 1): Pt will perform bed mobility with min assist PT Short Term Goal 3 (Week 1): Pt will ambulate 74f with mod assist and BUE support on RW and +2 for safety PT Short Term Goal 4 (Week 1): Pt will propell WC 1044fwith supervision assist  Skilled Therapeutic Interventions/Progress Updates:   Pt received sitting in WC and agreeable to PT. PT assisted pt to don shoes and socks with max assist for time management. Pt [facility] with min-mod assist and moderate cues for improved sequencing to lift gluteal surface from SB prior to scoot.   Lateral and cross body reaches to with the LUE to force WB through the LLE and L hip. Performed in fully seated position and partial stand x 8 each. Pt then performed lateral reach L in standing x 6. Visual feedback from mirror to improve midline orientation as well as max cues for improved terminal knee extension on the RLE to prevent lateral lean R.  Reciprocal scooting on mat table forward and backward x 4 each with mod assist progressing to min assist and moderate cues for improved sequencing.   [facility] and moderate cues for improved anterior weight shift and use of BLE to initiate transfer. Patient returned to room and [facility] reach and all needs met.         Therapy Documentation Precautions:  Precautions Precautions: Fall Precaution Comments: decreased L visual field Restrictions Weight Bearing Restrictions: Yes Pain: Pain Assessment Pain Scale: 0-10 Pain Score: 0-No pain   Therapy/Group: Individual  Therapy  AuLorie Phenix/03/2021, 8:50 AM

## 2021-01-06 NOTE — Progress Notes (Signed)
Physical Therapy Weekly Progress Note  Patient Details  Name: [name] MRN: [id number] Date of Birth: 22-May-1951  Beginning of progress report period: December 30, 2020 End of progress report period: January 06, 2021  Today's Date: 01/06/2021 PT Individual Time: 0900-0958 PT Individual Time Calculation (min): 58 min   Patient has met 2 of 4 short term goals.  Progress shown in all STGs and goals for bed mobility and transfers have been upgraded. STG for ambulation is partially upgraded. Pt is continuing to progress toward w/c goal.   Patient continues to demonstrate the following deficits muscle weakness, decreased cardiorespiratoy endurance, impaired timing and sequencing, unbalanced muscle activation, decreased coordination and decreased motor planning, decreased visual perceptual skills, decreased midline orientation and decreased motor planning, decreased safety awareness and decreased standing balance and decreased balance strategies and therefore will continue to benefit from skilled PT intervention to increase functional independence with mobility.  Patient progressing toward long term goals..  Continue plan of care.  PT Short Term Goals Week 1:  PT Short Term Goal 1 (Week 1): pt will transfer to [facility] with mod assist consistently PT Short Term Goal 1 - Progress (Week 1): Updated due to goal met PT Short Term Goal 2 (Week 1): Pt will perform bed mobility with min assist PT Short Term Goal 2 - Progress (Week 1): Updated due to goal met PT Short Term Goal 3 (Week 1): Pt will ambulate 94f with mod assist and BUE support on RW and +2 for safety PT Short Term Goal 3 - Progress (Week 1): Other (comment) (Partially revised) PT Short Term Goal 4 (Week 1): Pt will propell WC 1035fwith supervision assist PT Short Term Goal 4 - Progress (Week 1): Progressing toward goal Week 2:  PT Short Term Goal 1 (Week 2): [facility] Short Term Goal 2 (Week  2): Pt will perform bed mobility with CGA. PT Short Term Goal 3 (Week 2): Pt will ambulate 3038fith Mod A using RW and +2 for safety PT Short Term Goal 4 (Week 2): Pt will propel WC 100f44fth supervision assist.  Skilled Therapeutic Interventions/Progress Updates:    Patient seated upright in w/c upon PT arrival. Patient alert and agreeable to PT session. Patient denied pain throughout session.  Therapeutic Activity: Transfers: Patient performed STS throughout session w/c <> RW with Min A. Provided verbal cues for forward scoot for improved initial positioning, forward lean, hand placement/ progression, forward push with BUE, max encouragement provided for effort in rise to stand. Improved performance noted since last session with pt.  Gait Training:  Patient ambulated 18' 68/ 6' x1 using RW with Max A and +2 for w/c follow. Max A provided for RLE advancement and placement with Min A provided for tactile assist in weight shifting and improved posture. VC for sequencing of weight shift, advancing each step and muscle activation for controlled knee extension. Pt demos impulsivity with weight shifting and stepping to R foot requiring physical block of knee and cue for knee extension. Pt also noted to perform intermittent trunk extension for compensatory assist with advancement of RLE.  Neuromuscular Re-ed: NMR facilitated during session with focus on seated dynamic balance, cross body reach, and visual field processing. Pt guided in use of BITS for complex array as well as fixed attention tasks. Improvement noted in dynamic stepping of L foot when necessary. Vc req'd for technique in task. NMR performed for improvements in motor control and coordination,  balance, sequencing, judgement, and self confidence/ efficacy in performing all aspects of mobility at highest level of independence.   Patient seated upright in w/c at end of session with brakes locked, chair alarm set, and all needs within reach.  Dtr present with paperwork necessary for legal notary and witnesses to sign for pt's new POA.   Therapy Documentation Precautions:  Precautions Precautions: Fall Precaution Comments: decreased L visual field Restrictions Weight Bearing Restrictions: Yes  Therapy/Group: Individual Therapy  [name] PT, DPT 01/06/2021, 6:59 PM

## 2021-01-07 DIAGNOSIS — D62 Acute posthemorrhagic anemia: Secondary | ICD-10-CM | POA: Diagnosis not present

## 2021-01-07 DIAGNOSIS — I6612 Occlusion and stenosis of left anterior cerebral artery: Secondary | ICD-10-CM | POA: Diagnosis not present

## 2021-01-07 DIAGNOSIS — I639 Cerebral infarction, unspecified: Secondary | ICD-10-CM | POA: Diagnosis not present

## 2021-01-07 DIAGNOSIS — N319 Neuromuscular dysfunction of bladder, unspecified: Secondary | ICD-10-CM

## 2021-01-07 DIAGNOSIS — D696 Thrombocytopenia, unspecified: Secondary | ICD-10-CM | POA: Diagnosis not present

## 2021-01-07 DIAGNOSIS — I10 Essential (primary) hypertension: Secondary | ICD-10-CM

## 2021-01-07 DIAGNOSIS — N1832 Chronic kidney disease, stage 3b: Secondary | ICD-10-CM

## 2021-01-07 NOTE — Progress Notes (Signed)
Occupational Therapy Weekly Progress Note  Patient Details  Name: [name] MRN: [id number] Date of Birth: Mar 07, 1951  Beginning of progress report period: December 30, 2020 End of progress report period: January 07, 2021  Today's Date: 01/07/2021 OT Individual Time: 1400-1502 OT Individual Time Calculation (min): 62 min    Patient has met 2 of 3 short term goals.  Pt has made moderate progress this reporting period with OT, despite hospital course complication of new cerebral infarct 12/31/20. Pt is demonstrating improved static sitting balance, increased activity tolerance, and improved functional use of RUE/BUE coordination and R FMC. Currently min A for seated grooming/self-feeding, min to mod A for UB dressing (Bra + shirt), and dep in stedy for STS to complete LB ADLs and functional transfers.  Patient continues to demonstrate the following deficits: muscle weakness, decreased cardiorespiratoy endurance, impaired timing and sequencing, abnormal tone, unbalanced muscle activation, motor apraxia, decreased coordination and decreased motor planning and field cut and therefore will continue to benefit from skilled OT intervention to enhance overall performance with BADL and Reduce care partner burden.  Patient progressing toward long term goals..  Continue plan of care.  OT Short Term Goals Week 2:  OT Short Term Goal 1 (Week 2): Pt will complete BSC/toilet transfer with mod A without lift. OT Short Term Goal 2 (Week 2): Pt will thread BLE into pants with min A + AE PRN. OT Short Term Goal 3 (Week 2): Pt will bathe LB with min A + AE PRN.  Skilled Therapeutic Interventions/Progress Updates:    Pt received in w/c, agreeable to therapy. Session focus on functional transfers and RUE NMR in prep for improved ADL performance. SB transfer wc<>mat<>bed with overall mod A to block RLE + to lift and pivot hips. Practiced squat-pivots up and down the mat with overall mod A to lift and pivot  hips + mod VCs for sequencing. STS x2 via 3 musketeer with total A + 2 (pt 50%). Pt able to stand for ~20 stands before req seated rest break, c/o of L knee "locking up." Resolves with rest. STS x2 with RW with above assist levels + max TCs to encourage R knee extension. At varying heights, completed connected four game with RUE to target R [facility]. Demonstrates improved pincer grasp to drop pieces, difficulty with voluntary grasp above shoulder height and with increased fatigue. Incontinent void of bladder. Bed level, doffed brief/pants and donned new brief with total A. Pt able to complete peri care with set-up A.  Pt left semi-reclined with bed alarm engaged, call bell in reach, and all immediate needs met.    Therapy Documentation Precautions:  Precautions Precautions: Fall Precaution Comments: decreased L visual field Restrictions Weight Bearing Restrictions: Yes Pain: Pain Assessment Pain Scale: 0-10 Pain Score: Asleep Pain Type: Acute pain Pain Location: Head Pain Orientation: Right Pain Descriptors / Indicators: Aching Pain Frequency: Occasional Pain Onset: Gradual Pain Intervention(s): Medication (See eMAR) ADL: See Care Tool for more details.  Therapy/Group: Individual Therapy  Volanda Napoleon MS, OTR/L  01/07/2021, 7:38 AM

## 2021-01-07 NOTE — Progress Notes (Signed)
Physical Therapy Session Note  Patient Details  Name: [name] MRN: [id number] Date of Birth: [birth year]  Today's Date: 01/07/2021 PT Individual Time: 0905-1005 PT Individual Time Calculation (min): 60 min   Short Term Goals: Week 2:  PT Short Term Goal 1 (Week 2): Pt will consistently transfer to [facility] with Min A. PT Short Term Goal 2 (Week 2): Pt will perform bed mobility with CGA. PT Short Term Goal 3 (Week 2): Pt will ambulate 63f with Mod A using RW and +2 for safety PT Short Term Goal 4 (Week 2): Pt will propel WC 1085fwith supervision assist.  Skilled Therapeutic Interventions/Progress Updates:   Pt received sitting in WC and agreeable to PT. WC mobility in hall x 10096fith min assist throughout and max cues for attention to the RUE to improve symmetry of use between BUE.   Pt performed sit<>stand with min-mod assist from WC [facility] lite gait to don harness x 2. Cues for improved anterior weight shift and RLE LLE position to reduce knee pain. BWSTT 4 min +3 min, [id number] 28fa total of 310ft. 2fassist-total A for advancement and stability on the RLE with at least 30%-50% of body weight off loaded into support harness.  Gait training with at rail in hall with mod assist and +2 for WC follow x 15ft. P14fted to have improved weight shift L and activation of hip.knee flexion on the RLE. No knee bluckling noted in stance.   Patient returned to room and left sitting in [facility] reach and all needs met.         Therapy Documentation Precautions:  Precautions Precautions: Fall Precaution Comments: decreased L visual field Restrictions Weight Bearing Restrictions: Yes   Pain: Pain Assessment Pain Scale: 0-10 Pain Score: 0-No pain    Therapy/Group: Individual Therapy  Lasasha Brophy ELorie Phenix2, 10:06 AM

## 2021-01-07 NOTE — Progress Notes (Signed)
[place] PHYSICAL MEDICINE & REHABILITATION PROGRESS NOTE   Subjective/Complaints: Patient seen sitting up in bed this AM.  She states she slept well overnight.  She denies complaints.   ROS: Denies CP, SOB, N/V/D  Objective:   No results found. No results for input(s): WBC, HGB, HCT, PLT in the last 72 hours. No results for input(s): NA, K, CL, CO2, GLUCOSE, BUN, CREATININE, CALCIUM in the last 72 hours.  Intake/Output Summary (Last 24 hours) at 01/07/2021 1023 Last data filed at 01/07/2021 0853 Gross per 24 hour  Intake 1014 ml  Output 750 ml  Net 264 ml        Physical Exam: Vital Signs Blood pressure (!) 114/56, pulse 75, temperature (!) 97.5 F (36.4 C), temperature source Oral, resp. rate 18, height 5\' 8"  (1.727 m), weight 83.4 kg, SpO2 95 %. Constitutional: No distress . Vital signs reviewed. HENT: Normocephalic.  Atraumatic. Eyes: EOMI. No discharge. Cardiovascular: No JVD.  RRR. Respiratory: Normal effort.  No stridor.  Bilateral clear to auscultation. GI: Non-distended.  BS +. Skin: Warm and dry.   Left foot with dressing CDI Psych: Normal mood.  Normal behavior. Musc: No edema in extremities.  No tenderness in extremities. Neuro: Alert Motor: LUE/LLE: 5/5 proximal distal RUE: 4/5 proximal distal RLE: Hip flexion, knee extension 2+/5, ankle dorsiflexion 0/5  Assessment/Plan: 1. Functional deficits which require 3+ hours per day of interdisciplinary therapy in a comprehensive inpatient rehab setting.  Physiatrist is providing close team supervision and 24 hour management of active medical problems listed below.  Physiatrist and rehab team continue to assess barriers to discharge/monitor patient progress toward functional and medical goals  Care Tool:  Bathing    Body parts bathed by patient: Right arm,Left arm,Chest,Abdomen,Front perineal area,Right upper leg,Left upper leg,Face   Body parts bathed by helper: Right lower leg,Left lower leg,Buttocks      Bathing assist Assist Level: Dependent - Patient 0% (STS in stedy to reach buttocks)     Upper Body Dressing/Undressing Upper body dressing   What is the patient wearing?: Pull over shirt,Bra    Upper body assist Assist Level: Moderate Assistance - Patient 50 - 74%    Lower Body Dressing/Undressing Lower body dressing      What is the patient wearing?: Pants,Incontinence brief     Lower body assist Assist for lower body dressing: Dependent - Patient 0% (stedy for STS)     Toileting Toileting    Toileting assist Assist for toileting: Total Assistance - Patient < 25%     Transfers Chair/bed transfer  Transfers assist     Chair/bed transfer assist level: Moderate Assistance - Patient 50 - 74% (SB)     Locomotion Ambulation   Ambulation assist   Ambulation activity did not occur: Safety/medical concerns (motor control limitations; L knee pain with wt bearing)  Assist level: Maximal Assistance - Patient 25 - 49% Assistive device: Walker-rolling Max distance: 18 ft   Walk 10 feet activity   Assist  Walk 10 feet activity did not occur: Safety/medical concerns  Assist level: Maximal Assistance - Patient 25 - 49% Assistive device: Walker-rolling   Walk 50 feet activity   Assist Walk 50 feet with 2 turns activity did not occur: Safety/medical concerns         Walk 150 feet activity   Assist Walk 150 feet activity did not occur: N/A         Walk 10 feet on uneven surface  activity   Assist Walk 10 feet  on uneven surfaces activity did not occur: Safety/medical concerns (motor control limitations)         Wheelchair     Assist Will patient use wheelchair at discharge?: Yes (Per PT long term goals) Type of Wheelchair: Manual Wheelchair activity did not occur: Safety/medical concerns (time constraints due to toileting)  Wheelchair assist level: Moderate Assistance - Patient 50 - 74% Max wheelchair distance: 50    Wheelchair 50  feet with 2 turns activity    Assist        Assist Level: Moderate Assistance - Patient 50 - 74%   Wheelchair 150 feet activity     Assist      Assist Level: Total Assistance - Patient < 25%   Blood pressure (!) 114/56, pulse 75, temperature (!) 97.5 F (36.4 C), temperature source Oral, resp. rate 18, height 5\' 8"  (1.727 m), weight 83.4 kg, SpO2 95 %.  Medical Problem List and Plan: 1.Right leg weaknesssecondary to small acute infarct within the anterior right frontal lobe. Multiple old infarcts and chronic small vessel ischemia. New RIght ACA infarct  Continue CIR  2. Antithrombotics: -DVT/anticoagulation:  Transitioned to Eliquis on 1/30 -antiplatelet therapy: Aspirin 81 mg daily 3. Pain Management:Tylenol as needed thigh pain left likely muscular pain from exercise  sportscream  Controlled on 2/5 4. Mood:Aricept 10 mg daily, Lexapro 20 mg daily -antipsychotic agents: N/A 5. Neuropsych: This patientiscapable of making decisions on herown behalf. 6. Skin/Wound Care:Routine skin checks 7. Fluids/Electrolytes/Nutrition:Routine in and outs 8. Antihospholipid antibody syndrome/thrombocytopenia. Follow-up hematology services.  Eliquis per neuro  9. Hypertension. Monitor with increased mobility     Vitals:   12/30/20 0310 12/30/20 0412  BP: 121/73 119/70  Pulse: 68 67  Resp: 16 16  Temp: 98 F (36.7 C) 98.1 F (36.7 C)  SpO2: 100% 97%   Cozaar 25mg  daily  Controlled on 2/5 10. Hypothyroidism. Synthroid 11. Hyperlipidemia. Lipitor 12. CKD stage III.   Creatinine 1.77 on 1/20, labs ordered for Monday 13. Diarrhea. Patient was treated for loose stools daily for the past few weeks. Plan outpatient follow-up GI. Basic stool studies including GI pathogen panel neg, C diff neg, discussed with daughter that now is not a good time to proceed with endoscopy due to recent CVA and anticoagulation  Continue Imodium as  needed Reduced lipitor may be helping , also on Aricept since 11/29 this may also cough diarrhea , will hold and monitor   Appears to be improving 14.  Irregular heart rhythm EKG shows occasional PVC no treatment needed  15.  Spastic bladder due to CVA UA shows some WBC but urine cath , contaminated specimen , would reculture if dysuria occurs or fever  Trial oxybutnin increased to 5 mg 3 times daily  ?Improving 15.  Acute blood loss anemia  Hemoglobin 10.2 on 1/31, labs ordered for Monday 16.  Thrombocytopenia  Platelets 100 on 1/31, labs ordered for Monday LOS: 9 days A FACE TO FACE EVALUATION WAS PERFORMED  Ashwika Freels Lorie Phenix 01/07/2021, 10:23 AM

## 2021-01-08 ENCOUNTER — Inpatient Hospital Stay (HOSPITAL_COMMUNITY): Payer: Medicare Other

## 2021-01-08 DIAGNOSIS — R509 Fever, unspecified: Secondary | ICD-10-CM | POA: Diagnosis not present

## 2021-01-08 DIAGNOSIS — N319 Neuromuscular dysfunction of bladder, unspecified: Secondary | ICD-10-CM | POA: Diagnosis not present

## 2021-01-08 DIAGNOSIS — D62 Acute posthemorrhagic anemia: Secondary | ICD-10-CM | POA: Diagnosis not present

## 2021-01-08 DIAGNOSIS — I6612 Occlusion and stenosis of left anterior cerebral artery: Secondary | ICD-10-CM | POA: Diagnosis not present

## 2021-01-08 LAB — CBC
HCT: 30.2 % — ABNORMAL LOW (ref 36.0–46.0)
Hemoglobin: 9.9 g/dL — ABNORMAL LOW (ref 12.0–15.0)
MCH: 33 pg (ref 26.0–34.0)
MCHC: 32.8 g/dL (ref 30.0–36.0)
MCV: 100.7 fL — ABNORMAL HIGH (ref 80.0–100.0)
Platelets: 75 10*3/uL — ABNORMAL LOW (ref 150–400)
RBC: 3 MIL/uL — ABNORMAL LOW (ref 3.87–5.11)
RDW: 12.3 % (ref 11.5–15.5)
WBC: 10.4 10*3/uL (ref 4.0–10.5)
nRBC: 0 % (ref 0.0–0.2)

## 2021-01-08 LAB — COMPREHENSIVE METABOLIC PANEL
ALT: 44 U/L (ref 0–44)
AST: 47 U/L — ABNORMAL HIGH (ref 15–41)
Albumin: 3 g/dL — ABNORMAL LOW (ref 3.5–5.0)
Alkaline Phosphatase: 69 U/L (ref 38–126)
Anion gap: 9 (ref 5–15)
BUN: 29 mg/dL — ABNORMAL HIGH (ref 8–23)
CO2: 23 mmol/L (ref 22–32)
Calcium: 8.8 mg/dL — ABNORMAL LOW (ref 8.9–10.3)
Chloride: 102 mmol/L (ref 98–111)
Creatinine, Ser: 1.6 mg/dL — ABNORMAL HIGH (ref 0.44–1.00)
GFR, Estimated: 35 mL/min — ABNORMAL LOW (ref >60)
Glucose, Bld: 121 mg/dL — ABNORMAL HIGH (ref 70–99)
Potassium: 4.2 mmol/L (ref 3.5–5.1)
Sodium: 134 mmol/L — ABNORMAL LOW (ref 135–145)
Total Bilirubin: 1.2 mg/dL (ref 0.3–1.2)
Total Protein: 6.1 g/dL — ABNORMAL LOW (ref 6.5–8.1)

## 2021-01-08 LAB — SARS CORONAVIRUS 2 BY RT PCR (HOSPITAL ORDER, PERFORMED IN ~~LOC~~ HOSPITAL LAB): SARS Coronavirus 2: NEGATIVE

## 2021-01-08 IMAGING — CR DG ABDOMEN 1V
2 series · 2 of 2 positions shown · non-contrast
Comparison: None.

CLINICAL DATA: Vomiting.

EXAM:
ABDOMEN - 1 VIEW

[abdomen kub (1 of 2)]
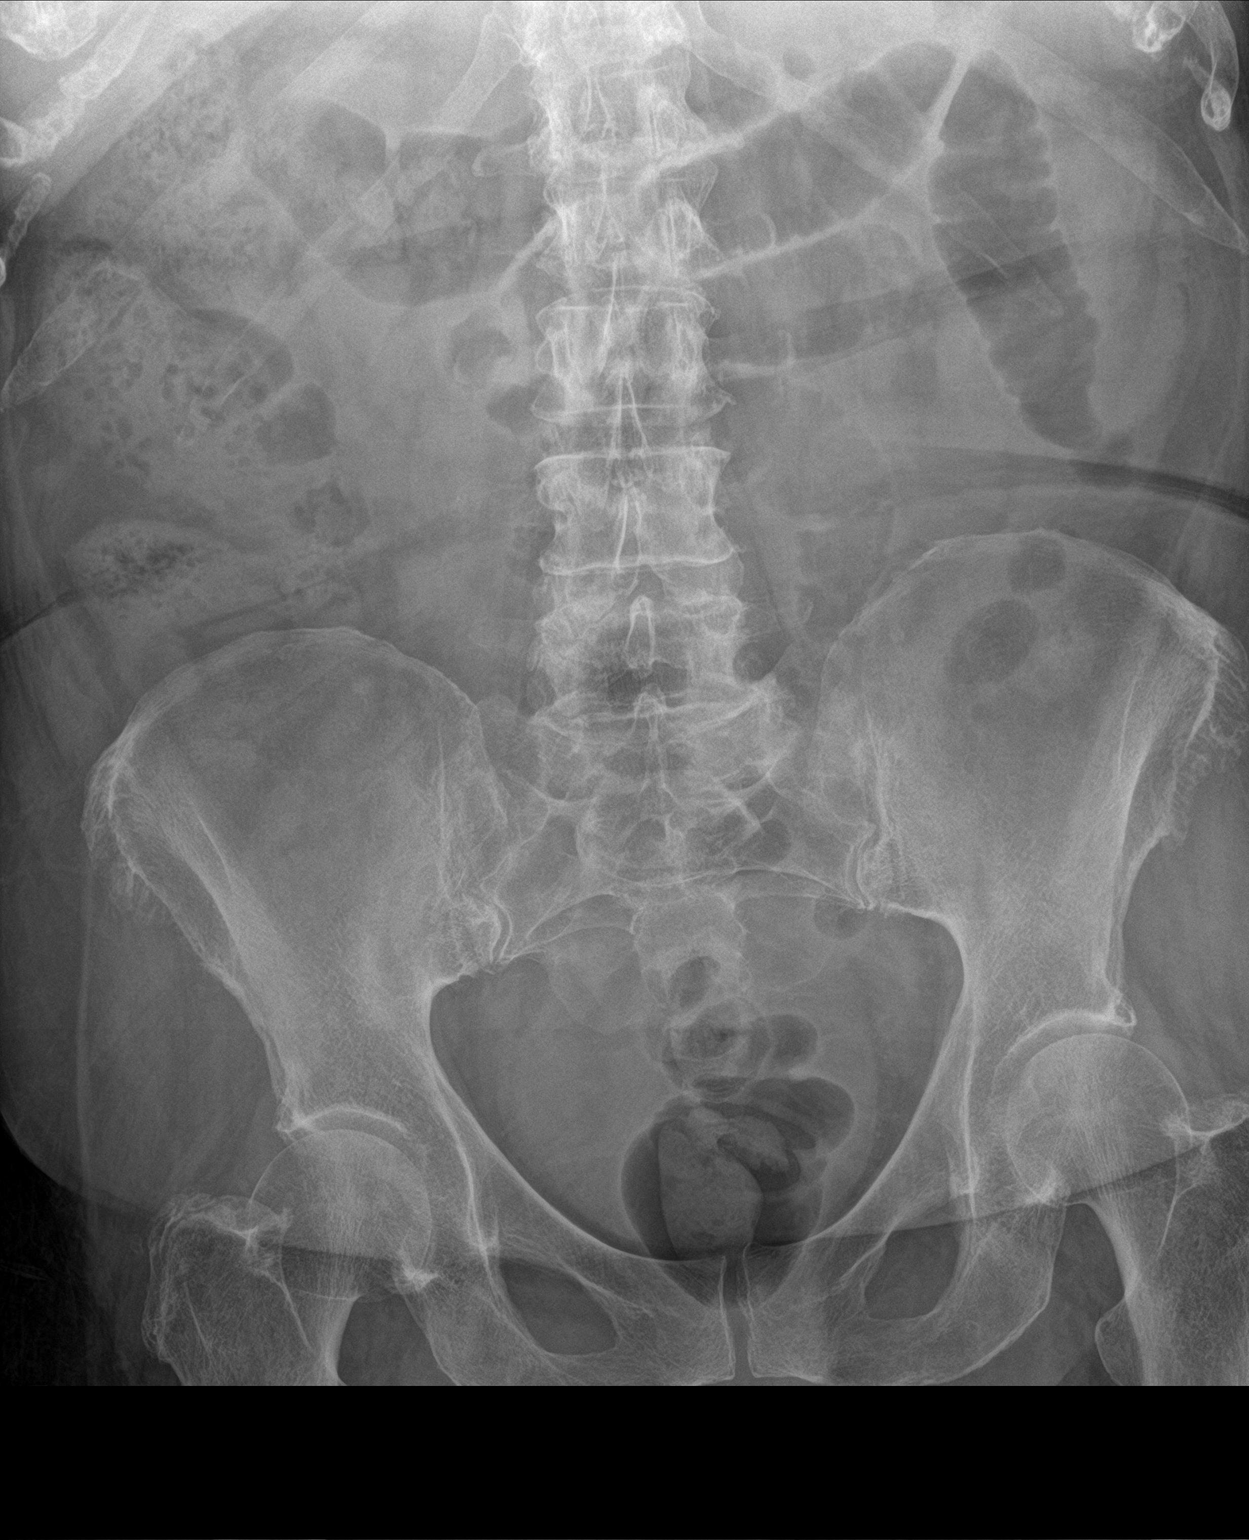

[abdomen kub (2 of 2)]
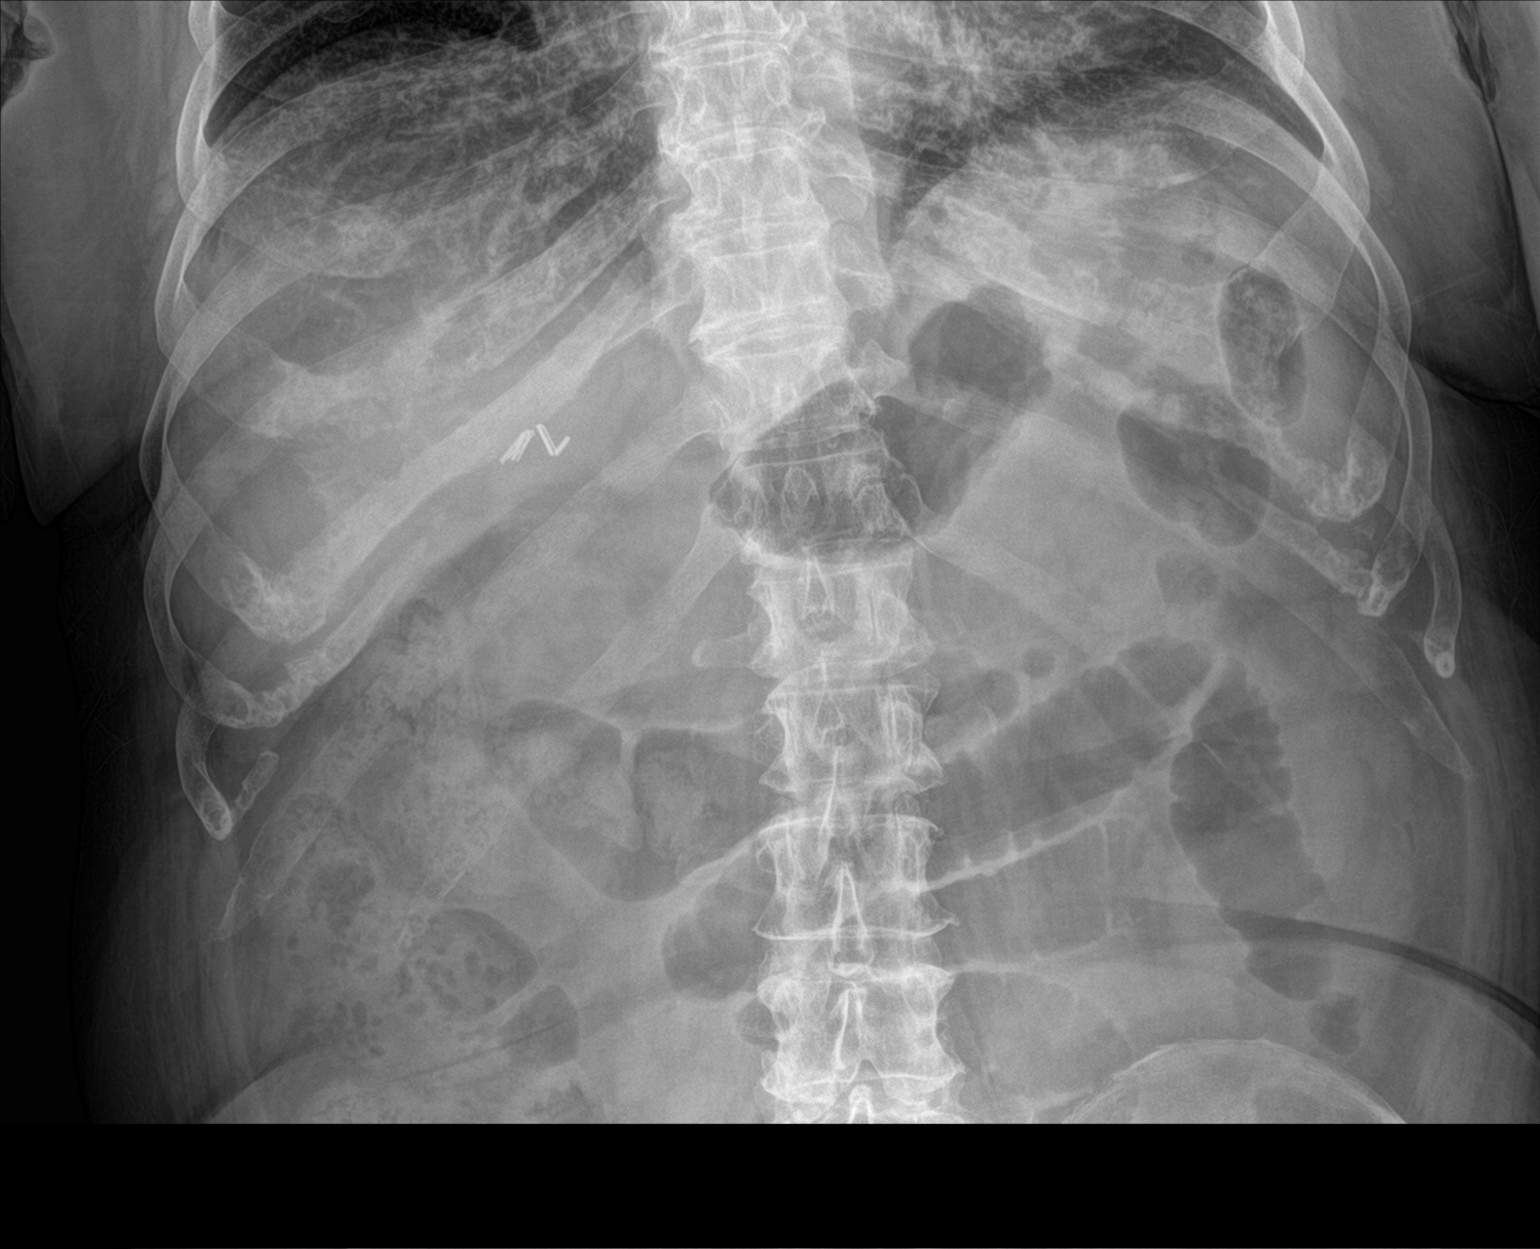

[2 of 2 positions shown; findings below may reference images not displayed]

FINDINGS: There are mildly dilated loops of small bowel in the abdomen
measuring up to approximately 3 cm. There is a large amount of stool
in the ascending colon. There is no concerning radiopaque kidney
stone. There are degenerative changes of the lumbar spine and
bilateral hips.
IMPRESSION: Mildly dilated loops of small bowel in the abdomen. Findings may
represent an ileus or developing partial small bowel obstruction.

## 2021-01-08 IMAGING — CR DG CHEST 2V
2 series · 2 of 2 positions shown · non-contrast
Comparison: [DATE]

CLINICAL DATA: Fever

EXAM:
CHEST - 2 VIEW

[chest ap]
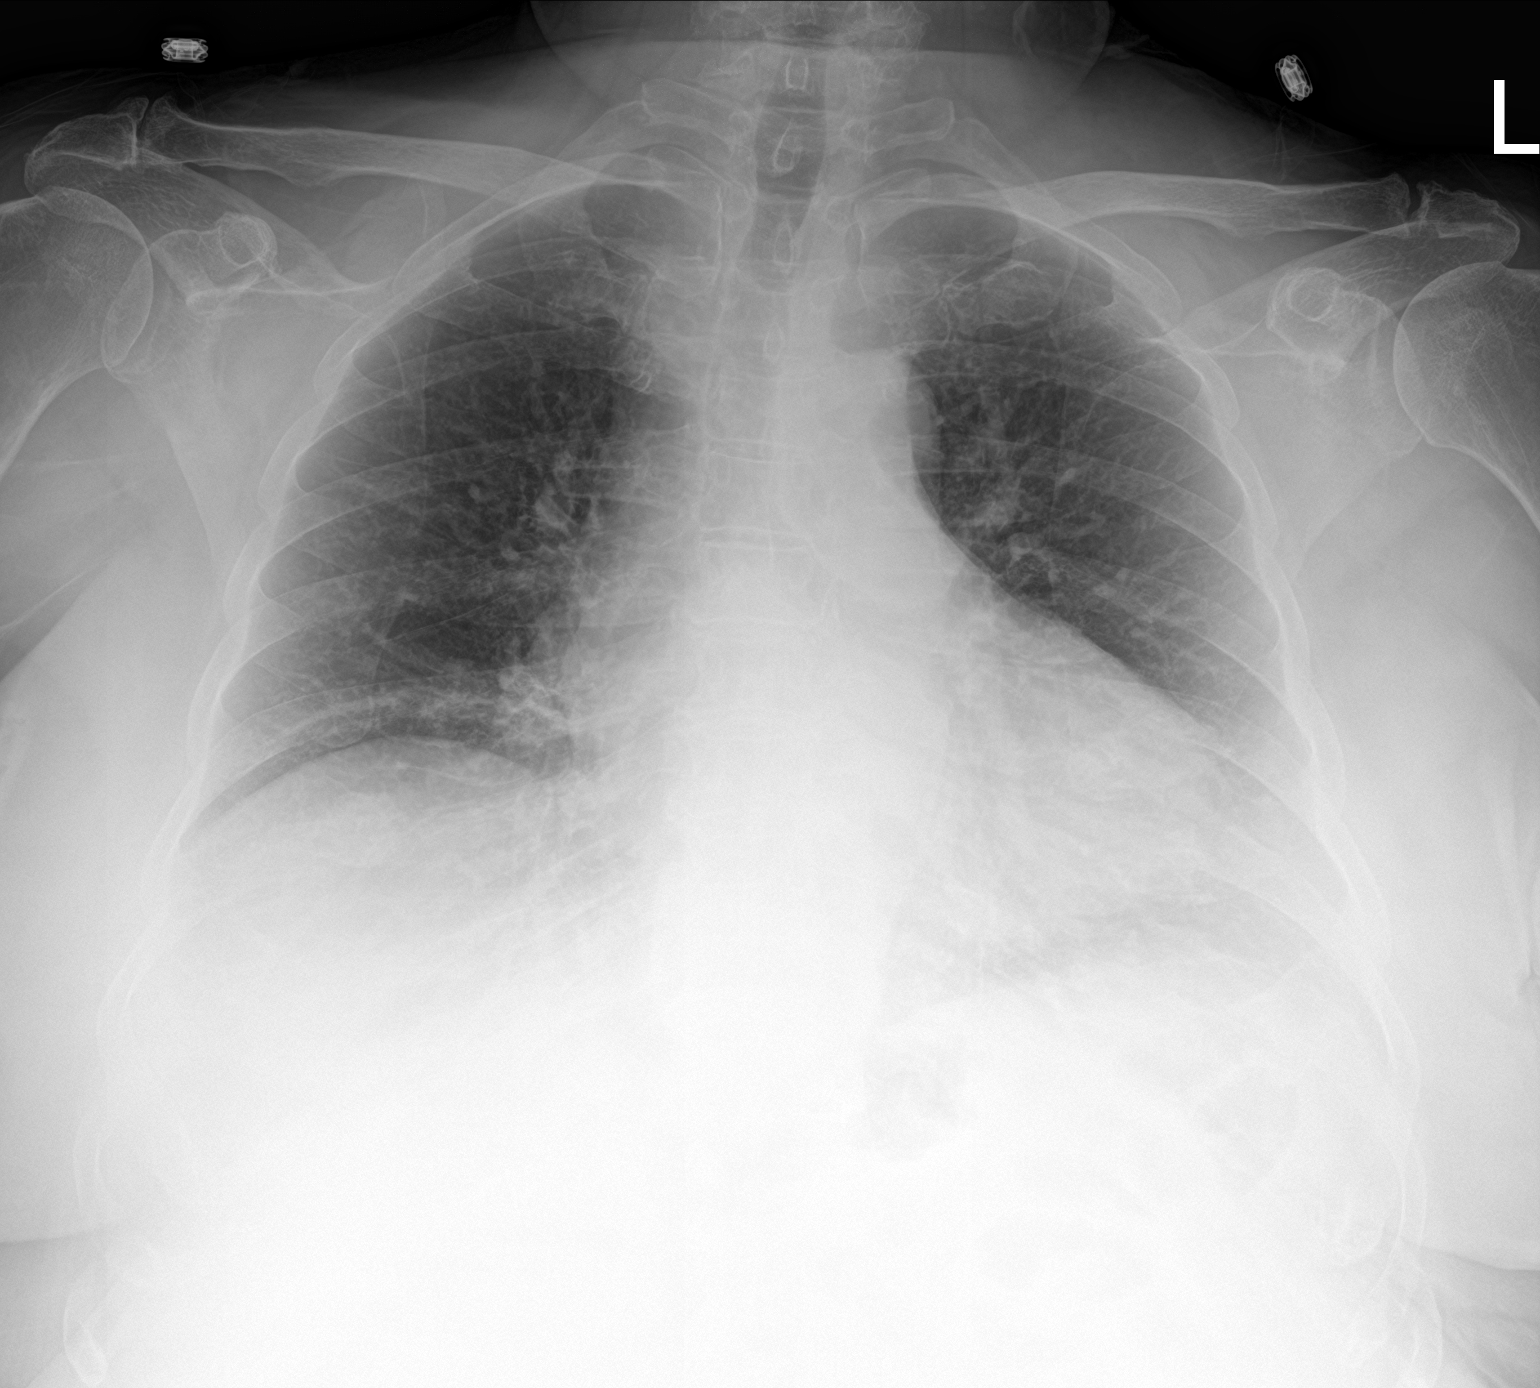

[chest lat]
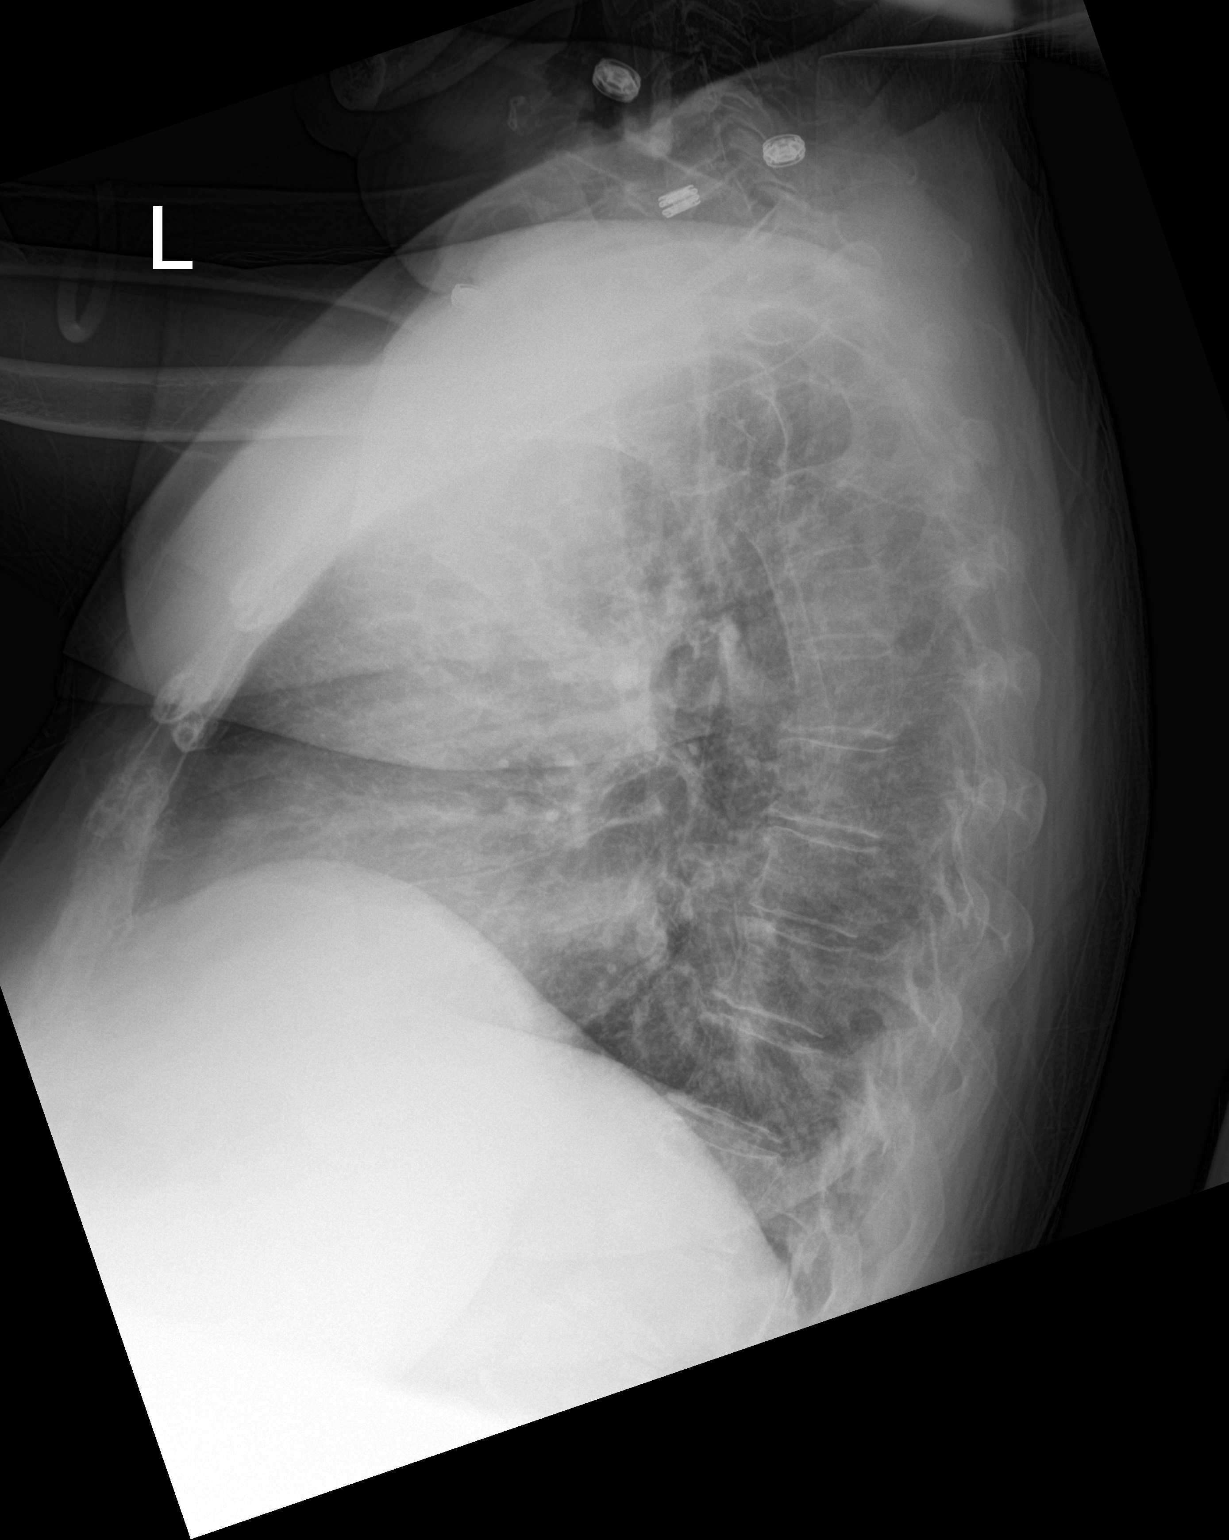

[2 of 2 positions shown; findings below may reference images not displayed]

FINDINGS: Frontal and lateral views of the chest demonstrate an unremarkable
cardiac silhouette. No airspace disease, effusion, or pneumothorax.
No acute bony abnormality.
IMPRESSION: 1. No acute intrathoracic process.

## 2021-01-08 MED ORDER — VANCOMYCIN HCL IN DEXTROSE 1-5 GM/200ML-% IV SOLN
1000.0000 mg | INTRAVENOUS | Status: DC
  Administered 2021-01-09: 1000 mg via INTRAVENOUS
  Filled 2021-01-08: qty 200

## 2021-01-08 MED ORDER — ONDANSETRON 4 MG PO TBDP
2.0000 mg | ORAL_TABLET | Freq: Three times a day (TID) | ORAL | Status: DC | PRN
  Administered 2021-01-08: 2 mg via ORAL
  Filled 2021-01-08: qty 1

## 2021-01-08 MED ORDER — METOCLOPRAMIDE HCL 5 MG/ML IJ SOLN
5.0000 mg | Freq: Three times a day (TID) | INTRAMUSCULAR | Status: DC
  Administered 2021-01-09 – 2021-01-10 (×8): 5 mg via INTRAVENOUS
  Filled 2021-01-08 (×10): qty 2

## 2021-01-08 MED ORDER — METOCLOPRAMIDE HCL 5 MG/ML IJ SOLN
5.0000 mg | Freq: Once | INTRAMUSCULAR | Status: AC
  Administered 2021-01-08: 5 mg via INTRAVENOUS

## 2021-01-08 MED ORDER — VANCOMYCIN HCL 1750 MG/350ML IV SOLN
1750.0000 mg | Freq: Once | INTRAVENOUS | Status: AC
  Administered 2021-01-08: 1750 mg via INTRAVENOUS
  Filled 2021-01-08: qty 350

## 2021-01-08 MED ORDER — PIPERACILLIN-TAZOBACTAM 3.375 G IVPB
3.3750 g | Freq: Three times a day (TID) | INTRAVENOUS | Status: DC
  Administered 2021-01-08 – 2021-01-10 (×5): 3.375 g via INTRAVENOUS
  Filled 2021-01-08 (×7): qty 50

## 2021-01-08 MED ORDER — SODIUM CHLORIDE 0.9 % IV SOLN: INTRAVENOUS | Status: DC

## 2021-01-08 NOTE — Progress Notes (Signed)
Patient had nausea and headache this morning; nausea and headache  resolved with zofran and tylenol.  Patient c/o again of headache around noon 4/10. Pain med given and resolved. Daughter was in room at lunch time and was concerned about patient being more weak today compared to last week which according to her the therapist said she is weak last Saturday as well. Patient was able to get out of bed with nursing to chair  And from chair to bed using stedy with cueing. Patient will say she feels weak but is able to get up using stedy.Patient c/o Her knee buckles but c/o left knee where she had surgery not the weak right leg.  Per daughter she is also concerned about her strokes since admission and want to speak with the doctor now. RN called Dr. Posey Pronto and relayed daughters concern. MD said he will call daughter.

## 2021-01-08 NOTE — Progress Notes (Signed)
[facility] PHYSICAL MEDICINE & REHABILITATION PROGRESS NOTE   Subjective/Complaints: Patient seen sitting up this morning.  She states she slept fairly overnight because she could not be comfortable.  Informed by nursing regarding nausea this a.m, she has not had her medications yet.  ROS: Denies CP, SOB, N/V/D  Objective:   No results found. No results for input(s): WBC, HGB, HCT, PLT in the last 72 hours. No results for input(s): NA, K, CL, CO2, GLUCOSE, BUN, CREATININE, CALCIUM in the last 72 hours.  Intake/Output Summary (Last 24 hours) at 01/08/2021 1434 Last data filed at 01/08/2021 1008 Gross per 24 hour  Intake 1017 ml  Output 875 ml  Net 142 ml        Physical Exam: Vital Signs Blood pressure 131/62, pulse 77, temperature 98.5 F (36.9 C), resp. rate 18, height 5\' 8"  (1.727 m), weight 83.4 kg, SpO2 97 %. Constitutional: No distress . Vital signs reviewed. HENT: Normocephalic.  Atraumatic. Eyes: EOMI. No discharge. Cardiovascular: No JVD.  RRR. Respiratory: Normal effort.  No stridor.  Bilateral clear to auscultation. GI: Non-distended.  BS +. Skin: Warm and dry.   Left foot with dressing CDI Psych: Normal mood.  Normal behavior. Musc: No edema in extremities.  No tenderness in extremities. Neuro: Alert Motor: LUE/LLE: 4+/5 proximal distal RUE: 4/5 proximal distal, unchanged RLE: Hip flexion, knee extension 2+/5, ankle dorsiflexion 0/5, unchanged  Assessment/Plan: 1. Functional deficits which require 3+ hours per day of interdisciplinary therapy in a comprehensive inpatient rehab setting.  Physiatrist is providing close team supervision and 24 hour management of active medical problems listed below.  Physiatrist and rehab team continue to assess barriers to discharge/monitor patient progress toward functional and medical goals  Care Tool:  Bathing    Body parts bathed by patient: Right arm,Left arm,Chest,Abdomen,Front perineal area,Right upper leg,Left  upper leg,Face   Body parts bathed by helper: Right lower leg,Left lower leg,Buttocks     Bathing assist Assist Level: Dependent - Patient 0% (STS in stedy to reach buttocks)     Upper Body Dressing/Undressing Upper body dressing   What is the patient wearing?: Pull over shirt,Bra    Upper body assist Assist Level: Moderate Assistance - Patient 50 - 74%    Lower Body Dressing/Undressing Lower body dressing      What is the patient wearing?: Pants,Incontinence brief     Lower body assist Assist for lower body dressing: Total Assistance - Patient < 25% (bed level)     Toileting Toileting    Toileting assist Assist for toileting: Total Assistance - Patient < 25%     Transfers Chair/bed transfer  Transfers assist     Chair/bed transfer assist level: Moderate Assistance - Patient 50 - 74% (SB)     Locomotion Ambulation   Ambulation assist   Ambulation activity did not occur: Safety/medical concerns (motor control limitations; L knee pain with wt bearing)  Assist level: Maximal Assistance - Patient 25 - 49% Assistive device: Walker-rolling Max distance: 18 ft   Walk 10 feet activity   Assist  Walk 10 feet activity did not occur: Safety/medical concerns  Assist level: Maximal Assistance - Patient 25 - 49% Assistive device: Walker-rolling   Walk 50 feet activity   Assist Walk 50 feet with 2 turns activity did not occur: Safety/medical concerns         Walk 150 feet activity   Assist Walk 150 feet activity did not occur: N/A         Walk 10  feet on uneven surface  activity   Assist Walk 10 feet on uneven surfaces activity did not occur: Safety/medical concerns (motor control limitations)         Wheelchair     Assist Will patient use wheelchair at discharge?: Yes (Per PT long term goals) Type of Wheelchair: Manual Wheelchair activity did not occur: Safety/medical concerns (time constraints due to toileting)  Wheelchair assist  level: Moderate Assistance - Patient 50 - 74% Max wheelchair distance: 50    Wheelchair 50 feet with 2 turns activity    Assist        Assist Level: Moderate Assistance - Patient 50 - 74%   Wheelchair 150 feet activity     Assist      Assist Level: Total Assistance - Patient < 25%   Blood pressure 131/62, pulse 77, temperature 98.5 F (36.9 C), resp. rate 18, height 5\' 8"  (1.727 m), weight 83.4 kg, SpO2 97 %.  Medical Problem List and Plan: 1.Right leg weaknesssecondary to small acute infarct within the anterior right frontal lobe. Multiple old infarcts and chronic small vessel ischemia. New RIght ACA infarct  Continue CIR   Spoke with daughter regarding her concerns of stroke due to generalized weakness with nausea this AM.  Nausea resolved this AM with 2mg  of Zofran.  Discussed focal neurological deficits vs generalized.  2. Antithrombotics: -DVT/anticoagulation:  Transitioned to Eliquis on 1/30 -antiplatelet therapy: Aspirin 81 mg daily 3. Pain Management:Tylenol as needed thigh pain left likely muscular pain from exercise  sportscream  Controlled on 2/6 4. Mood:Aricept 10 mg daily, Lexapro 20 mg daily -antipsychotic agents: N/A 5. Neuropsych: This patientiscapable of making decisions on herown behalf. 6. Skin/Wound Care:Routine skin checks 7. Fluids/Electrolytes/Nutrition:Routine in and outs 8. Antihospholipid antibody syndrome/thrombocytopenia. Follow-up hematology services.  Eliquis per neuro  9. Hypertension. Monitor with increased mobility  Cozaar 25mg  daily  Controlled on 2/6 10. Hypothyroidism. Synthroid 11. Hyperlipidemia. Lipitor 12. CKD stage III.   Creatinine 1.77 on 1/28, labs ordered for tomorrow 13. Diarrhea. Patient was treated for loose stools daily for the past few weeks. Plan outpatient follow-up GI. Basic stool studies including GI pathogen panel neg, C diff neg, discussed with daughter that  now is not a good time to proceed with endoscopy due to recent CVA and anticoagulation  Continue Imodium as needed Reduced lipitor may be helping , also on Aricept since 11/29 this may also cough diarrhea , will hold and monitor   Improving 14.  Irregular heart rhythm EKG shows occasional PVC no treatment needed  15.  Spastic bladder due to CVA UA shows some WBC but urine cath , contaminated specimen , would reculture if dysuria occurs or fever  Trial oxybutnin increased to 5 mg 3 times daily  ?Improving 15.  Acute blood loss anemia  Hemoglobin 10.2 on 1/31, labs ordered for tomorrow 16.  Thrombocytopenia  Platelets 100 on 1/31, labs ordered for tomorrow  LOS: 10 days A FACE TO FACE EVALUATION WAS PERFORMED  Yobani Schertzer Lorie Phenix 01/08/2021, 2:34 PM

## 2021-01-08 NOTE — Progress Notes (Addendum)
Brief Note:  Please see progress note from earlier today. Called by nursing regarding fever and vomiting.  Earlier today, pt with headache as well.  Workup initiated and ongoing.  Started on broad spectrum abx (IV Vanc and Zosyn)- may need to modify based on results of workup.  May also require additional abdominal imaging. Daughter updated.

## 2021-01-08 NOTE — Progress Notes (Signed)
Neuro check done per order. Offered fluids and vanilla pudding. Advised patient to drink more water.

## 2021-01-08 NOTE — Progress Notes (Signed)
Called pharmacy claims vanc and zosyn are variable. Zosyn started.

## 2021-01-08 NOTE — Plan of Care (Signed)
  Problem: RH BLADDER ELIMINATION Goal: RH STG MANAGE BLADDER WITH ASSISTANCE Description: STG Manage Bladder With mod I  Assistance Outcome: Not Progressing; incontinence   

## 2021-01-08 NOTE — Progress Notes (Signed)
Paged  Dr. Posey Pronto with results of ABD xray. New orders received. Patrici Ranks A

## 2021-01-08 NOTE — Progress Notes (Addendum)
Pharmacy Antibiotic Note  [name] is a 70 y.o. female admitted on 12/29/2020 with stroke.  Pharmacy has been consulted for vancomycin and zosyn dosing.  Febrile to 102.7, wbc was normal on 1/31 at 5.5, now up to 10. Scr was 1.6 on recheck tonight.   Vancomycin 1000 mg IV Q 24 hrs. Goal AUC 400-550. Expected AUC: 519  SCr used: 1.6  Plan: Vancomycin 1750 IV now, then 1g q24 hours. Goal trough 15-20 mcg/mL. Zosyn 3.375g IV q8h (4 hour infusion) for now  Height: 5\' 8"  (172.7 cm) Weight: 83.4 kg (183 lb 13.8 oz) IBW/kg (Calculated) : 63.9  Temp (24hrs), Avg:99.6 F (37.6 C), Min:98.5 F (36.9 C), Max:102.7 F (39.3 C)  Recent Labs  Lab 01/02/21 0506  WBC 5.5    Estimated Creatinine Clearance: 34 mL/min (A) (by C-G formula based on SCr of 1.77 mg/dL (H)).    Allergies  Allergen Reactions  . Penicillin G Rash    Thank you for allowing pharmacy to be a part of this patient's care.  Erin Hearing PharmD., BCPS Clinical Pharmacist 01/08/2021 6:12 PM

## 2021-01-08 NOTE — Progress Notes (Signed)
Daughter notified of patient's condition. No visitation until Covid test is resulted.

## 2021-01-08 NOTE — Progress Notes (Signed)
Cbc results called to MD.

## 2021-01-08 NOTE — Progress Notes (Signed)
   01/08/21 1703  Assess: MEWS Score  Temp (!) 102.7 F (39.3 C)  BP (!) 111/53  Pulse Rate 99  Resp (!) 24  SpO2 98 %  Assess: MEWS Score  MEWS Temp 2  MEWS Systolic 0  MEWS Pulse 0  MEWS RR 1  MEWS LOC 0  MEWS Score 3  MEWS Score Color Yellow  Assess: if the MEWS score is Yellow or Red  Were vital signs taken at a resting state? Yes  Focused Assessment Change from prior assessment (see assessment flowsheet)  Early Detection of Sepsis Score *See Row Information* High  Treat  MEWS Interventions Other (Comment)  Pain Scale 0-10  Pain Score 0  Take Vital Signs  Increase Vital Sign Frequency  Yellow: Q 2hr X 2 then Q 4hr X 2, if remains yellow, continue Q 4hrs  Notify: Charge Nurse/RN  Name of Charge Nurse/RN Notified Dauda RN  Date Charge Nurse/RN Notified 01/08/21  Time Charge Nurse/RN Notified 1707  Notify: Provider  Provider Name/Title PAtel  Date Provider Notified 01/08/21  Time Provider Notified 1707  Notification Type Call  Response See new orders  Date of Provider Response 01/08/21  Time of Provider Response 1707  Notify: Rapid Response  Name of Rapid Response RN Notified n/a

## 2021-01-09 ENCOUNTER — Inpatient Hospital Stay (HOSPITAL_COMMUNITY): Payer: Medicare Other

## 2021-01-09 DIAGNOSIS — I6612 Occlusion and stenosis of left anterior cerebral artery: Secondary | ICD-10-CM | POA: Diagnosis not present

## 2021-01-09 LAB — URINALYSIS, ROUTINE W REFLEX MICROSCOPIC
Bilirubin Urine: NEGATIVE
Glucose, UA: NEGATIVE mg/dL
Ketones, ur: NEGATIVE mg/dL
Nitrite: POSITIVE — AB
Protein, ur: NEGATIVE mg/dL
Specific Gravity, Urine: 1.009 (ref 1.005–1.030)
WBC, UA: >50 WBC/hpf — ABNORMAL HIGH (ref 0–5)
pH: 5 (ref 5.0–8.0)

## 2021-01-09 LAB — CBC WITH DIFFERENTIAL/PLATELET
Abs Immature Granulocytes: 0.07 10*3/uL (ref 0.00–0.07)
Basophils Absolute: 0 10*3/uL (ref 0.0–0.1)
Basophils Relative: 0 %
Eosinophils Absolute: 0 10*3/uL (ref 0.0–0.5)
Eosinophils Relative: 0 %
HCT: 29 % — ABNORMAL LOW (ref 36.0–46.0)
Hemoglobin: 9.7 g/dL — ABNORMAL LOW (ref 12.0–15.0)
Immature Granulocytes: 1 %
Lymphocytes Relative: 4 %
Lymphs Abs: 0.4 10*3/uL — ABNORMAL LOW (ref 0.7–4.0)
MCH: 33.2 pg (ref 26.0–34.0)
MCHC: 33.4 g/dL (ref 30.0–36.0)
MCV: 99.3 fL (ref 80.0–100.0)
Monocytes Absolute: 0.5 10*3/uL (ref 0.1–1.0)
Monocytes Relative: 5 %
Neutro Abs: 9.9 10*3/uL — ABNORMAL HIGH (ref 1.7–7.7)
Neutrophils Relative %: 90 %
Platelets: 66 10*3/uL — ABNORMAL LOW (ref 150–400)
RBC: 2.92 MIL/uL — ABNORMAL LOW (ref 3.87–5.11)
RDW: 12.5 % (ref 11.5–15.5)
WBC: 10.9 10*3/uL — ABNORMAL HIGH (ref 4.0–10.5)
nRBC: 0 % (ref 0.0–0.2)

## 2021-01-09 LAB — BASIC METABOLIC PANEL
Anion gap: 11 (ref 5–15)
BUN: 29 mg/dL — ABNORMAL HIGH (ref 8–23)
CO2: 20 mmol/L — ABNORMAL LOW (ref 22–32)
Calcium: 8.8 mg/dL — ABNORMAL LOW (ref 8.9–10.3)
Chloride: 101 mmol/L (ref 98–111)
Creatinine, Ser: 1.67 mg/dL — ABNORMAL HIGH (ref 0.44–1.00)
GFR, Estimated: 33 mL/min — ABNORMAL LOW (ref >60)
Glucose, Bld: 137 mg/dL — ABNORMAL HIGH (ref 70–99)
Potassium: 4.1 mmol/L (ref 3.5–5.1)
Sodium: 132 mmol/L — ABNORMAL LOW (ref 135–145)

## 2021-01-09 LAB — LACTIC ACID, PLASMA: Lactic Acid, Venous: 0.7 mmol/L (ref 0.5–1.9)

## 2021-01-09 LAB — PROCALCITONIN: Procalcitonin: 0.46 ng/mL

## 2021-01-09 IMAGING — CT CT ABD-PELV W/O CM
2 of 4 series · 17 of 46 positions shown, 19 images · non-contrast
Comparison: [DATE]

CLINICAL DATA: Dilated loops of bowel on recent plain film

EXAM:
CT ABDOMEN AND PELVIS WITHOUT CONTRAST
TECHNIQUE: Multidetector CT imaging of the abdomen and pelvis was performed
following the standard protocol without IV contrast.

[Series 3: a/p w/o 5mm · axial · non-contrast · 0.82mm/px · z∈[+794,+1208]mm · 14 of 91 slices shown, 16 images]
[im 4/91  soft-tissue]
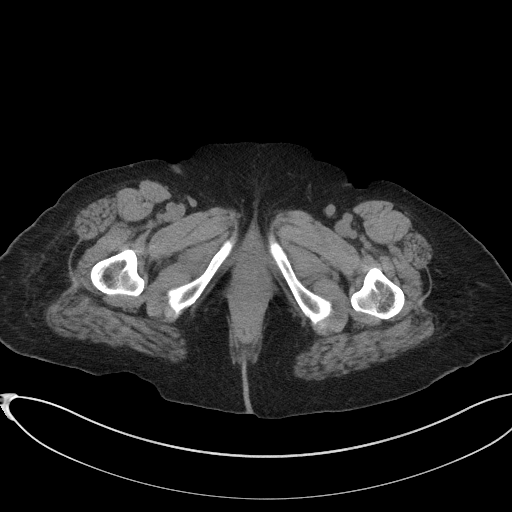
[im 4/91  bone]
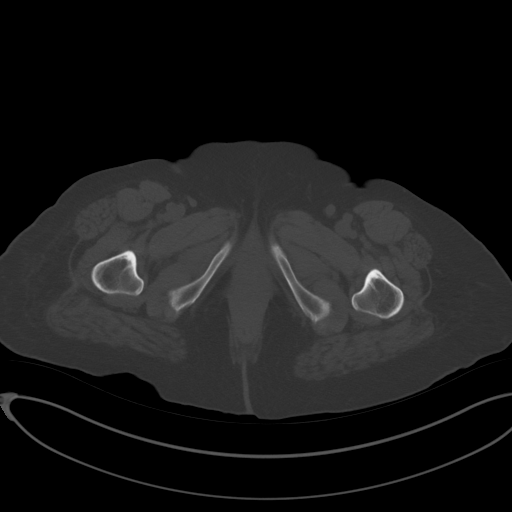
[im 12/91  soft-tissue]
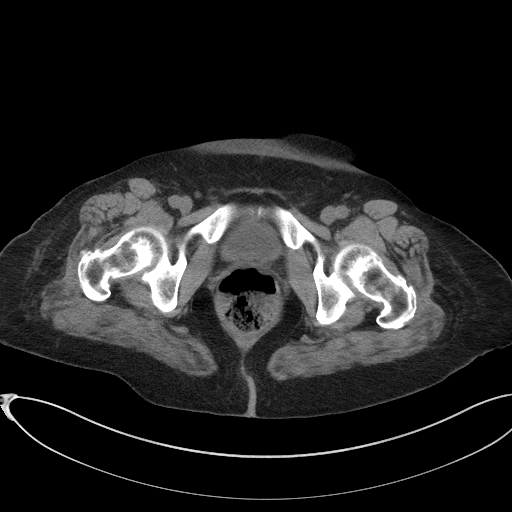
[im 19/91  soft-tissue]
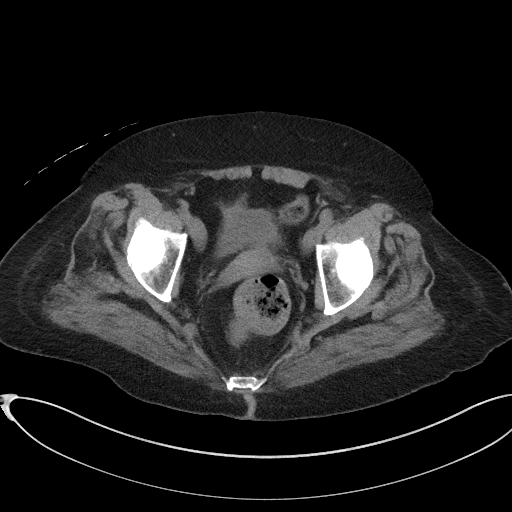
[im 23/91  soft-tissue]
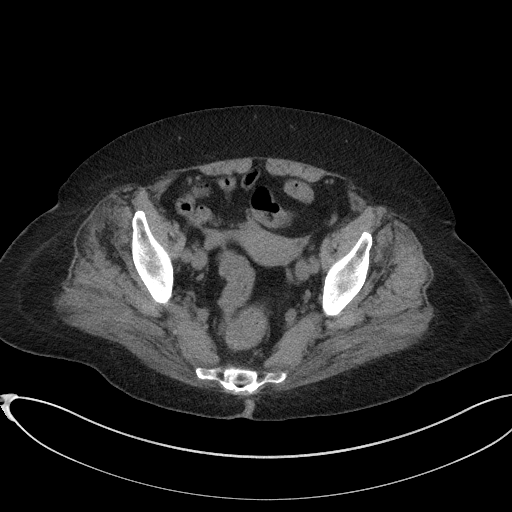
[im 31/91  soft-tissue]
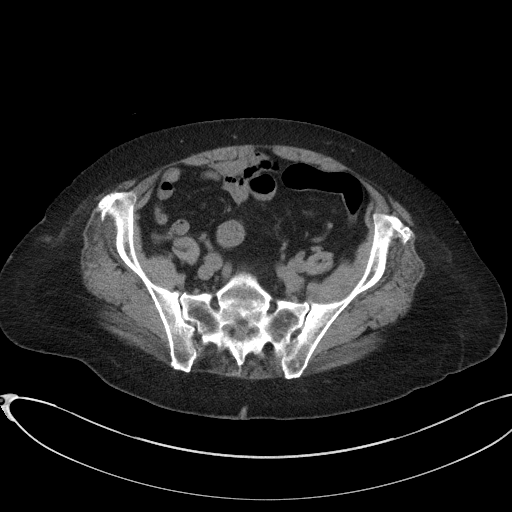
[im 38/91  soft-tissue]
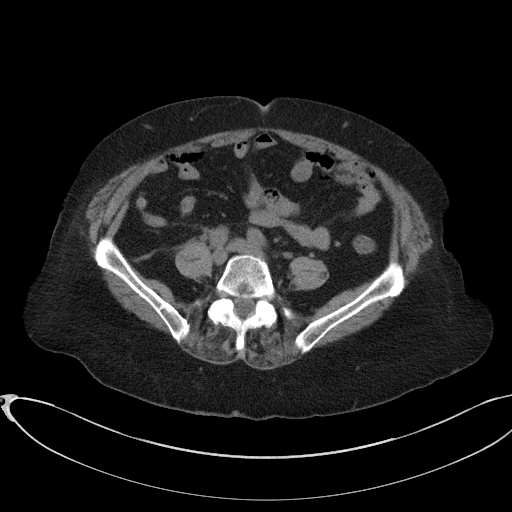
[im 42/91  soft-tissue]
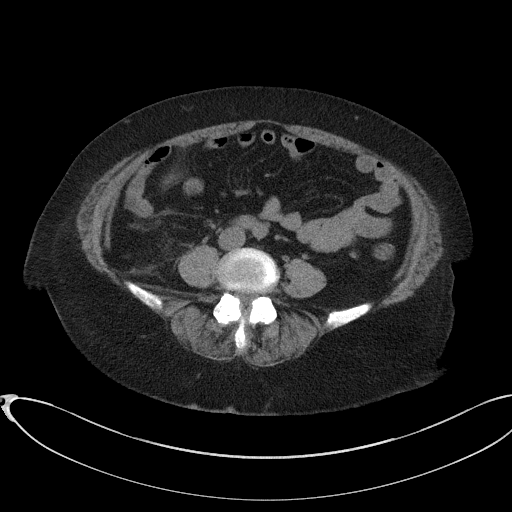
[im 49/91  soft-tissue]
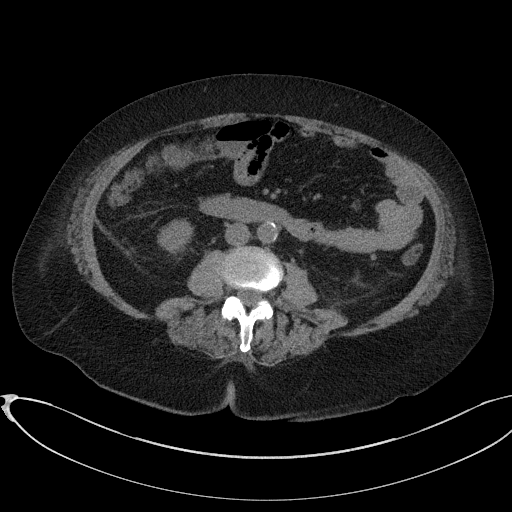
[im 53/91  soft-tissue]
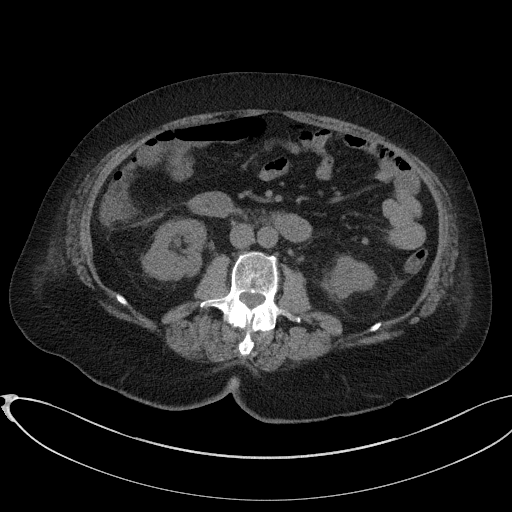
[im 53/91  bone]
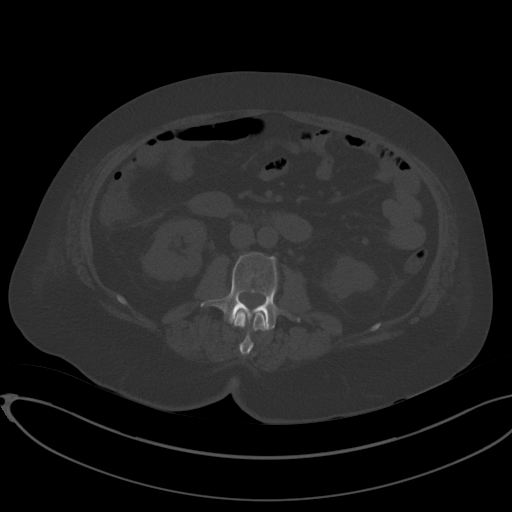
[im 61/91  soft-tissue]
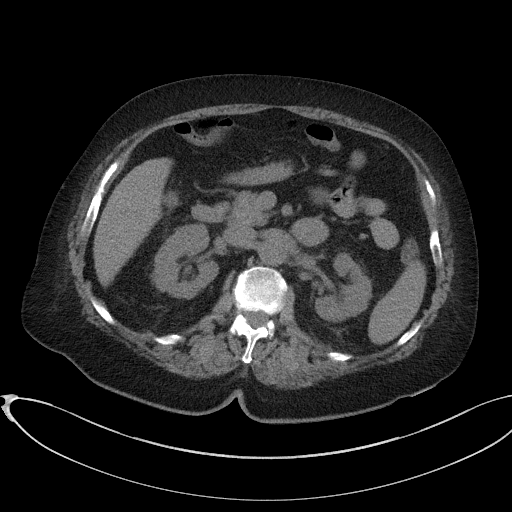
[im 68/91  soft-tissue]
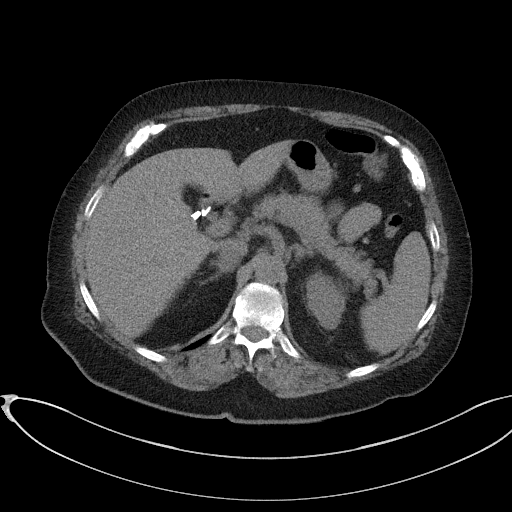
[im 72/91  soft-tissue]
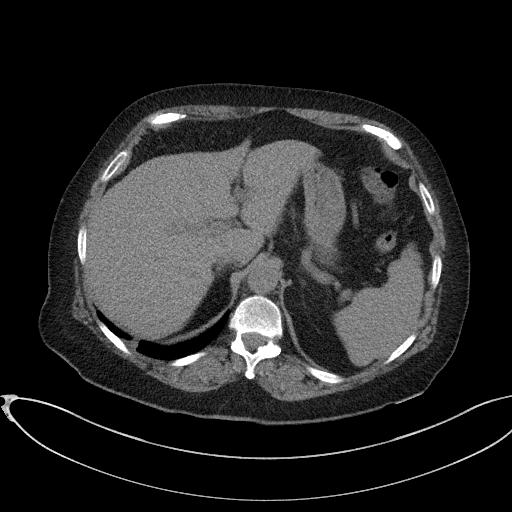
[im 79/91  soft-tissue]
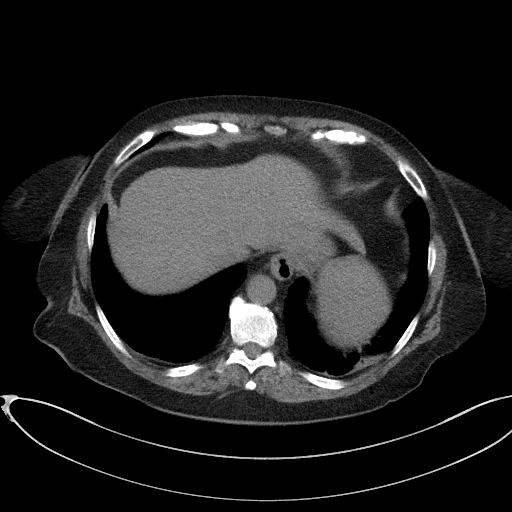
[im 87/91  soft-tissue]
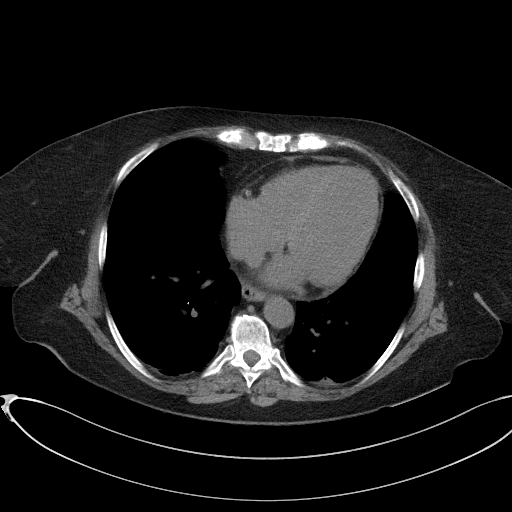

[Series 6: a/p w/o cor · coronal · non-contrast · 0.94mm/px · 3 of 151 slices shown]
[im 51/151  soft-tissue]
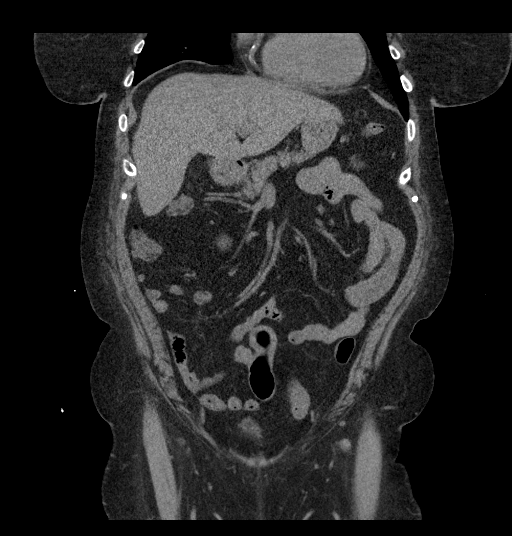
[im 67/151  soft-tissue]
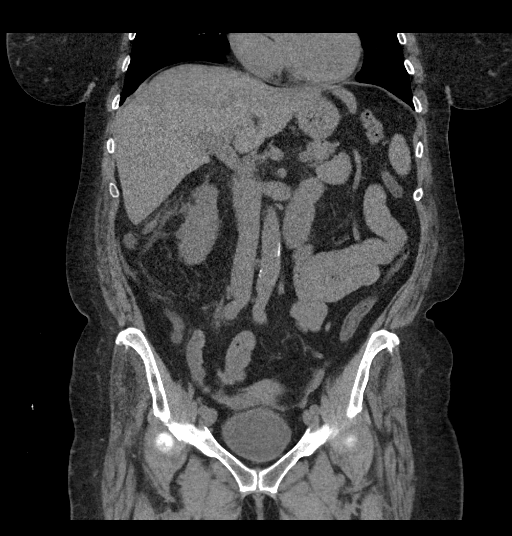
[im 84/151  soft-tissue]
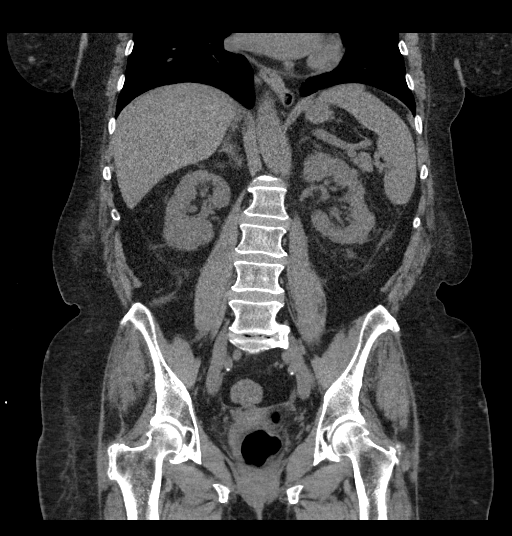

[17 of 46 positions shown; findings below may reference images not displayed]

FINDINGS: Lower chest: Mild basilar atelectasis is noted.

Hepatobiliary: No focal liver abnormality is seen. Status post
cholecystectomy. No biliary dilatation.

Pancreas: Unremarkable. No pancreatic ductal dilatation or
surrounding inflammatory changes.

Spleen: Normal in size without focal abnormality.

Adrenals/Urinary Tract: Adrenal glands are within normal limits.
Kidneys demonstrate a normal appearance bilaterally. No renal
calculi or obstructive changes are seen. The ureters are within
normal limits. The bladder is decompressed.

Stomach/Bowel: Colon is predominately decompressed. Small bowel
shows no significant obstructive change. Stomach is within normal
limits.

Vascular/Lymphatic: Aortic atherosclerosis. No enlarged abdominal or
pelvic lymph nodes.

Reproductive: Uterus and bilateral adnexa are unremarkable.

Other: No abdominal wall hernia or abnormality. No abdominopelvic
ascites.

Musculoskeletal: No acute or significant osseous findings.
IMPRESSION: No significant bowel dilatation is noted. Previously seen changes
have resolved in the interval.

No new focal abnormality is noted.

## 2021-01-09 MED ORDER — DICLOFENAC SODIUM 1 % EX GEL
2.0000 g | Freq: Four times a day (QID) | CUTANEOUS | Status: DC | PRN
  Administered 2021-01-10 – 2021-01-16 (×5): 2 g via TOPICAL
  Filled 2021-01-09: qty 100

## 2021-01-09 MED ORDER — LOSARTAN POTASSIUM 25 MG PO TABS
12.5000 mg | ORAL_TABLET | Freq: Every day | ORAL | Status: DC
  Administered 2021-01-10 – 2021-01-11 (×2): 12.5 mg via ORAL
  Filled 2021-01-09 (×2): qty 1

## 2021-01-09 NOTE — Progress Notes (Signed)
Occupational Therapy Session Note  Patient Details  Name: [name] MRN: [id number] Date of Birth: 1951-02-02  Today's Date: 01/09/2021 OT Individual Time: 0800-0900   &   1500-1528 OT Individual Time Calculation (min): 60 min  & 28 min   Short Term Goals: Week 2:  OT Short Term Goal 1 (Week 2): Pt will complete BSC/toilet transfer with mod A without lift. OT Short Term Goal 2 (Week 2): Pt will thread BLE into pants with min A + AE PRN. OT Short Term Goal 3 (Week 2): Pt will bathe LB with min A + AE PRN.  Skilled Therapeutic Interventions/Progress Updates:    AM session:   Patient in bed, she notes that she feels better than yesterday and requests to get out of bed for a little bit.  She notes mild HA and that she is thirsty - orders for NPO status at this time.  IV running.  Supine to sitting edge of bed with mod A - min A to maintain unsupported sitting balance.  SB transfer bed to w/c mod A.  She completed oral care and washing face w/c level with set up.  Utilized stedy to toilet - min A to stand from w/c and toilet surfaces, max A for clothing management and hygiene (incontinent of loose stool, continent of urine).  Max A to donn clean pull up and hospital gown.  stedy transfer back to bed .  Sit to supine mod A.  She remained in bed at close of session with bed alarm set and call bell in reach.     PM session:   Patient in bed, alert and states that she is feeling good but tired.  She is eager to move and participate in therapy session.  Supine to sitting edge of bed with mod A - cues for technique.  Sit to stand in stedy with CGA - focus of session was standing tolerance, weight shift, reaching and trunk control utilizing stedy - brief seated rest breaks.  Improved balance and trunk control noted this session and ability to reach right arm OH.  Assisted with removing pants and returning to bed at close of session due to fatigue.  Mod A sit to supine.  Bed alarm set, call bell and  tray table in reach.      Therapy Documentation Precautions:  Precautions Precautions: Fall Precaution Comments: decreased L visual field Restrictions Weight Bearing Restrictions: No   Therapy/Group: Individual Therapy  Carlos Levering 01/09/2021, 7:31 AM

## 2021-01-09 NOTE — Progress Notes (Signed)
[name] PHYSICAL MEDICINE & REHABILITATION PROGRESS NOTE   Subjective/Complaints: Slept well last night CXR yesterday normal Abdominal XR shows ileus vs. SBO: noncontrast CT ordered given elevated creatinine. Results discussed with patient. She feels better with Reglan.   ROS: Denies CP, SOB, N/V/D, abdominal pain  Objective:   DG Chest 2 View  Result Date: 01/08/2021 CLINICAL DATA:  Fever EXAM: CHEST - 2 VIEW COMPARISON:  03/17/2019 FINDINGS: Frontal and lateral views of the chest demonstrate an unremarkable cardiac silhouette. No airspace disease, effusion, or pneumothorax. No acute bony abnormality. IMPRESSION: 1. No acute intrathoracic process. Electronically Signed   By: Randa Ngo M.D.   On: 01/08/2021 20:39   DG Abd 1 View  Result Date: 01/08/2021 CLINICAL DATA:  Vomiting. EXAM: ABDOMEN - 1 VIEW COMPARISON:  None. FINDINGS: There are mildly dilated loops of small bowel in the abdomen measuring up to approximately 3 cm. There is a large amount of stool in the ascending colon. There is no concerning radiopaque kidney stone. There are degenerative changes of the lumbar spine and bilateral hips. IMPRESSION: Mildly dilated loops of small bowel in the abdomen. Findings may represent an ileus or developing partial small bowel obstruction. Electronically Signed   By: Constance Holster M.D.   On: 01/08/2021 22:09   Recent Labs    01/08/21 1753 01/09/21 0050  WBC 10.4 10.9*  HGB 9.9* 9.7*  HCT 30.2* 29.0*  PLT 75* 66*   Recent Labs    01/08/21 1753 01/09/21 0050  NA 134* 132*  K 4.2 4.1  CL 102 101  CO2 23 20*  GLUCOSE 121* 137*  BUN 29* 29*  CREATININE 1.60* 1.67*  CALCIUM 8.8* 8.8*    Intake/Output Summary (Last 24 hours) at 01/09/2021 0858 Last data filed at 01/09/2021 0400 Gross per 24 hour  Intake 551.31 ml  Output 400 ml  Net 151.31 ml        Physical Exam: Vital Signs Blood pressure 98/69, pulse 97, temperature 100 F (37.8 C), temperature source Oral,  resp. rate 18, height 5\' 8"  (1.727 m), weight 83.4 kg, SpO2 96 %. Gen: no distress, normal appearing HEENT: oral mucosa pink and moist, NCAT Cardio: Reg rate Chest: normal effort, normal rate of breathing Abd: soft, non-distended Ext: no edema Psych: pleasant, normal affect Skin:  Left foot with dressing CDI Psych: Normal mood.  Normal behavior. Musc: No edema in extremities.  No tenderness in extremities. Neuro: Alert Motor: LUE/LLE: 4+/5 proximal distal RUE: 4/5 proximal distal, unchanged RLE: Hip flexion, knee extension 2+/5, ankle dorsiflexion 0/5, unchanged  Assessment/Plan: 1. Functional deficits which require 3+ hours per day of interdisciplinary therapy in a comprehensive inpatient rehab setting.  Physiatrist is providing close team supervision and 24 hour management of active medical problems listed below.  Physiatrist and rehab team continue to assess barriers to discharge/monitor patient progress toward functional and medical goals  Care Tool:  Bathing    Body parts bathed by patient: Right arm,Left arm,Chest,Abdomen,Front perineal area,Right upper leg,Left upper leg,Face   Body parts bathed by helper: Right lower leg,Left lower leg,Buttocks     Bathing assist Assist Level: Dependent - Patient 0% (STS in stedy to reach buttocks)     Upper Body Dressing/Undressing Upper body dressing   What is the patient wearing?: Pull over shirt,Bra    Upper body assist Assist Level: Moderate Assistance - Patient 50 - 74%    Lower Body Dressing/Undressing Lower body dressing      What is the patient wearing?: Pants,Incontinence  brief     Lower body assist Assist for lower body dressing: Total Assistance - Patient < 25% (bed level)     Toileting Toileting    Toileting assist Assist for toileting: Total Assistance - Patient < 25%     Transfers Chair/bed transfer  Transfers assist     Chair/bed transfer assist level: Moderate Assistance - Patient 50 - 74%  (SB)     Locomotion Ambulation   Ambulation assist   Ambulation activity did not occur: Safety/medical concerns (motor control limitations; L knee pain with wt bearing)  Assist level: Maximal Assistance - Patient 25 - 49% Assistive device: Walker-rolling Max distance: 18 ft   Walk 10 feet activity   Assist  Walk 10 feet activity did not occur: Safety/medical concerns  Assist level: Maximal Assistance - Patient 25 - 49% Assistive device: Walker-rolling   Walk 50 feet activity   Assist Walk 50 feet with 2 turns activity did not occur: Safety/medical concerns         Walk 150 feet activity   Assist Walk 150 feet activity did not occur: N/A         Walk 10 feet on uneven surface  activity   Assist Walk 10 feet on uneven surfaces activity did not occur: Safety/medical concerns (motor control limitations)         Wheelchair     Assist Will patient use wheelchair at discharge?: Yes (Per PT long term goals) Type of Wheelchair: Manual Wheelchair activity did not occur: Safety/medical concerns (time constraints due to toileting)  Wheelchair assist level: Moderate Assistance - Patient 50 - 74% Max wheelchair distance: 50    Wheelchair 50 feet with 2 turns activity    Assist        Assist Level: Moderate Assistance - Patient 50 - 74%   Wheelchair 150 feet activity     Assist      Assist Level: Total Assistance - Patient < 25%   Blood pressure 98/69, pulse 97, temperature 100 F (37.8 C), temperature source Oral, resp. rate 18, height 5\' 8"  (1.727 m), weight 83.4 kg, SpO2 96 %.  Medical Problem List and Plan: 1.Right leg weaknesssecondary to small acute infarct within the anterior right frontal lobe. Multiple old infarcts and chronic small vessel ischemia. New RIght ACA infarct  Continue CIR   Spoke with daughter regarding her concerns of stroke due to generalized weakness  2.  Antithrombotics: -DVT/anticoagulation:  Transitioned to Eliquis on 1/30 -antiplatelet therapy: Aspirin 81 mg daily 3. Pain Management:Tylenol as needed thigh pain left likely muscular pain from exercise  sportscream  2/7: voltaren gel added for left knee 4. Mood:Aricept 10 mg daily, Lexapro 20 mg daily -antipsychotic agents: N/A 5. Neuropsych: This patientiscapable of making decisions on herown behalf. 6. Skin/Wound Care:Routine skin checks 7. Fluids/Electrolytes/Nutrition:Routine in and outs 8. Antihospholipid antibody syndrome/thrombocytopenia. Follow-up hematology services.  Eliquis per neuro  9. Hypertension. Monitor with increased mobility  Cozaar 25mg  daily  2/7: BP is soft to 98/69. Has been low last several reads, decrease Cozaar to 12.5mg .  10. Hypothyroidism. Synthroid 11. Hyperlipidemia. Lipitor 12. CKD stage III.   Creatinine 1.77 on 1/28, 1.67 on 2/7.  13. Diarrhea. Patient was treated for loose stools daily for the past few weeks. Plan outpatient follow-up GI. Basic stool studies including GI pathogen panel neg, C diff neg, discussed with daughter that now is not a good time to proceed with endoscopy due to recent CVA and anticoagulation  Continue Imodium as needed Reduced lipitor may  be helping , also on Aricept since 11/29 this may also cough diarrhea , will hold and monitor   Improving 14.  Irregular heart rhythm EKG shows occasional PVC no treatment needed  15.  Spastic bladder due to CVA UA shows some WBC but urine cath , contaminated specimen , would reculture if dysuria occurs or fever  Trial oxybutnin increased to 5 mg 3 times daily  ?Improving 15.  Acute blood loss anemia  Hemoglobin 10.2 on 1/31, 9/7 on 2/7 16.  Thrombocytopenia  Platelets 100 on 1/31, 65 on 2/7.  17. Ileus/SBO: continue reglan, CT abdomen w/o contrast ordered given elevated creatinine. Continue NPO  LOS: 11 days A FACE TO FACE EVALUATION WAS  PERFORMED  [name] 01/09/2021, 8:58 AM

## 2021-01-09 NOTE — Progress Notes (Signed)
Pt notified staff that she was vomiting. This nurse assisted the pt while vomiting. Pt vomited very large amount, requiring total bed change. Pt still c/o N/V and chills. Primary nurse Jimmie Molly notified, Dr. Posey Pronto gave verbal order for IV Reglan to be administered now. Emmanuel administered the medication.

## 2021-01-09 NOTE — Progress Notes (Signed)
Physical Therapy Session Note  Patient Details  Name: [name] MRN: [id number] Date of Birth: 10-22-1951  Today's Date: 01/09/2021 PT Individual Time: 1432-1500 PT Individual Time Calculation (min): 28 min   Short Term Goals: Week 2:  PT Short Term Goal 1 (Week 2): Pt will consistently transfer to [facility] with Min A. PT Short Term Goal 2 (Week 2): Pt will perform bed mobility with CGA. PT Short Term Goal 3 (Week 2): Pt will ambulate 69ft with Mod A using RW and +2 for safety PT Short Term Goal 4 (Week 2): Pt will propel WC 169ft with supervision assist. Week 3:     Skilled Therapeutic Interventions/Progress Updates:    Patient out of room for CT scan upon scheduled start of session. Nursing relates that pt c/o of nausea and experienced elevated temp with vomiting the night before. Pt sent for CT and imaging concerning for small bowel obstruction. Placed on NPO. Pt out for subsequent CT at start of this session. Returns midway through and states she would like to go to bathroom.   Supine--> Sit with min A for initiation of RLE as well as for UB to reach upright seated position. STEDY used for ease of transport to toilet. STS at [facility] with CGA. Once at toilet, pt is unable to rise to stand prior to repositioning of knees with weight shifting. Then can rise to stand with Min A. Then Min A also required for controlled descent to toilet. She is able to move bowels and nursing relates that she will recommend to dr that possible obstruction is cleared. Will also recommend to remove NPO so that pt may have some water and a meal. Return to bed with STS at [facility] with CGA. Return to supine performed with Min A for RLE only. VC and Mod A for R hip/ knee flexion once in bed, in order to improve positioning. Pt performs rolling side to side in bed with Min A to L and supervision and use of bedrail to R side. Also, d/t NPO orders, pt's acetaminophen must be provided by nurse in form of suppository, and abx  for UTI provided via IV.   Patient supine in bed at end of session with brakes locked, bed alarm set, and all needs within reach. Nursing still present in room.   Therapy Documentation Precautions:  Precautions Precautions: Fall Precaution Comments: decreased L visual field Restrictions Weight Bearing Restrictions: No  Therapy/Group: Individual Therapy  [name] 01/09/2021, 4:27 PM

## 2021-01-09 NOTE — Progress Notes (Signed)
Physical Therapy Session Note  Patient Details  Name: [name] MRN: [id number] Date of Birth: Sep 29, 1951  Today's Date: 01/09/2021 PT Individual Time: 1131-1159 PT Individual Time Calculation (min): 28 min   Short Term Goals: Week 2:  PT Short Term Goal 1 (Week 2): Pt will consistently transfer to [facility] with Min A. PT Short Term Goal 2 (Week 2): Pt will perform bed mobility with CGA. PT Short Term Goal 3 (Week 2): Pt will ambulate 42ft with Mod A using RW and +2 for safety PT Short Term Goal 4 (Week 2): Pt will propel WC 142ft with supervision assist.  Skilled Therapeutic Interventions/Progress Updates: Pt presents semi-reclined in bed and agreeable to therapy.  Pt performed sup to sitting at EOB w/ min A and then verbal and manual cues for scooting to get feet flat.  Pt sat EOB w/o LOB for donning of shoes.  Pt requested to don pants so assisted w/ threading pants over LEs in seated.  Pt transferred sit to stand w/ max A and blocking of R LE to attempt pulling up pants.  Unable to perform sot SPT bed > w/c w/ facilitation for weight shift to turn, but unable to advance RLE.  Pt sat in w/c and wheeled to foot of bed.  Pt required only min A for sit to stand using foot board and then able to pull pants over hips using B hands at separate times.  Pt performed weight shift .  Pt remained in w/c w/ seat alarm on and all needs in reach.      Therapy Documentation Precautions:  Precautions Precautions: Fall Precaution Comments: decreased L visual field Restrictions Weight Bearing Restrictions: No General:   Vital Signs:   Pain: no c/o pain     Therapy/Group: Individual Therapy  Ladoris Gene 01/09/2021, 12:51 PM

## 2021-01-10 DIAGNOSIS — I6612 Occlusion and stenosis of left anterior cerebral artery: Secondary | ICD-10-CM | POA: Diagnosis not present

## 2021-01-10 MED ORDER — NITROFURANTOIN MONOHYD MACRO 100 MG PO CAPS: 100.0000 mg | ORAL_CAPSULE | Freq: Two times a day (BID) | ORAL | Status: DC

## 2021-01-10 MED ORDER — SODIUM CHLORIDE 0.9 % IV SOLN
2.0000 g | INTRAVENOUS | Status: DC
  Administered 2021-01-10: 2 g via INTRAVENOUS
  Filled 2021-01-10 (×2): qty 2

## 2021-01-10 MED ORDER — SODIUM CHLORIDE 0.9 % IV SOLN
1.0000 g | INTRAVENOUS | Status: DC
  Filled 2021-01-10: qty 10

## 2021-01-10 NOTE — Progress Notes (Signed)
Patient resting verbalized discomfort of headache and right neck pain medicated with Tylenol 650 mg.repostioned and monitored

## 2021-01-10 NOTE — Progress Notes (Signed)
Occupational Therapy Session Note  Patient Details  Name: [name] MRN: [id number] Date of Birth: November 29, 1951  Today's Date: 01/10/2021 OT Individual Time: 1430-1526 OT Individual Time Calculation (min): 56 min    Short Term Goals: Week 2:  OT Short Term Goal 1 (Week 2): Pt will complete BSC/toilet transfer with mod A without lift. OT Short Term Goal 2 (Week 2): Pt will thread BLE into pants with min A + AE PRN. OT Short Term Goal 3 (Week 2): Pt will bathe LB with min A + AE PRN.  Skilled Therapeutic Interventions/Progress Updates:    Pt received in w/c, agreeable to therapy. Transport in w/c to therapy kitchen. Attempted STS x3 with  Total A + 2 (pt 15%), but pt unable to come into fully erect posture 2/2 L knee pain as her "knee locks up." Seated, opened, poured, and stirred the ingredients to make corn bread with BUE. Pt req min A to fully stir batter and pour into pan 2/2 fatigue. Demonstrates slow, insufficient stirs to batter and weak grasp when using R hand. Transported back to room in w/c, SB to bed with overall min A to scoot, total A to place and remove board. Doffed B shoes with total A. Sitting EOB > supine with min A to bring RLE onto bed. Left semi-reclined in bed, call bell in reach, bed alarm engaged, and all immediate needs met.  Therapy Documentation Precautions:  Precautions Precautions: Fall Precaution Comments: decreased L visual field Restrictions Weight Bearing Restrictions: No Pain: Pain Assessment Pain Scale: Faces Faces Pain Scale: Hurts little more Pain Type: Acute pain Pain Location: Knee Pain Orientation: Left Pain Intervention(s): Rest ADL: See Care Tool for more details.   Therapy/Group: Individual Therapy  Volanda Napoleon MS, OTR/L  01/10/2021, 3:34 PM

## 2021-01-10 NOTE — Progress Notes (Signed)
Physical Therapy Session Note  Patient Details  Name: [name] MRN: [id number] Date of Birth: 27-Jan-1951  Today's Date: 01/10/2021 PT Individual Time: 1300-1345 PT Individual Time Calculation (min): 45 min   Short Term Goals: Week 2:  PT Short Term Goal 1 (Week 2): Pt will consistently transfer to [facility] with Min A. PT Short Term Goal 2 (Week 2): Pt will perform bed mobility with CGA. PT Short Term Goal 3 (Week 2): Pt will ambulate 66ft with Mod A using RW and +2 for safety PT Short Term Goal 4 (Week 2): Pt will propel WC 179ft with supervision assist. Week 3:     Skilled Therapeutic Interventions/Progress Updates:    Patient seated upright in w/c upon PT arrival to room. Enteric precautions lifted. Patient alert and agreeable to PT session. Patient denied pain during session.  Therapeutic Activity: Transfers: Patient performed slide board transfer w/c <> mattable in both directions with Mod A to initiate and Min A to perform and complete. Mod A for board placement when sliding to pt's R side and Min A when sliding to pt's L side.  Provided verbal cues for hand placement for proper push/ pull mechanics, weight shift.   Neuromuscular Re-ed: NMR facilitated during session with focus onRLE praxis and motor control. Pt guided in placement of R heel to 2 inch step in LE extension followed by lift of LE (hip flexion) and knee flexionto clear step. She requires AAROM to perform x15. Pt also performs foot slides for improved HS activation. Requires gravity elimination for palpated  hamstring contraction and AAROM to perform.  NMR performed for improvements in motor control and coordination, balance, sequencing, judgement, and self confidence/ efficacy in performing all aspects of mobility at highest level of independence.    Patient seated upright in w/c  at end of session with brakes locked, belt alarm set, and all needs within reach.    Therapy Documentation Precautions:   Precautions Precautions: Fall Precaution Comments: decreased L visual field Restrictions Weight Bearing Restrictions: No  Therapy/Group: Individual Therapy  [name] 01/10/2021, 6:19 PM

## 2021-01-10 NOTE — Progress Notes (Signed)
Physical Therapy Session Note  Patient Details  Name: [name] MRN: [id number] Date of Birth: 02-Apr-1951  Today's Date: 01/10/2021 PT Individual Time: 1022-1040 PT Individual Time Calculation (min): 18 min   Short Term Goals: Week 1:  PT Short Term Goal 1 (Week 1): pt will transfer to [facility] with mod assist consistently PT Short Term Goal 1 - Progress (Week 1): Updated due to goal met PT Short Term Goal 2 (Week 1): Pt will perform bed mobility with min assist PT Short Term Goal 2 - Progress (Week 1): Updated due to goal met PT Short Term Goal 3 (Week 1): Pt will ambulate 43f with mod assist and BUE support on RW and +2 for safety PT Short Term Goal 3 - Progress (Week 1): Other (comment) (Partially revised) PT Short Term Goal 4 (Week 1): Pt will propell WC 1015fwith supervision assist PT Short Term Goal 4 - Progress (Week 1): Progressing toward goal Week 2:  PT Short Term Goal 1 (Week 2): [facility]. PT Short Term Goal 2 (Week 2): Pt will perform bed mobility with CGA. PT Short Term Goal 3 (Week 2): Pt will ambulate 3084fith Mod A using RW and +2 for safety PT Short Term Goal 4 (Week 2): Pt will propel WC [id number] supervision assist.  Skilled Therapeutic Interventions/Progress Updates:    [facility] pt missed time with previous therapy, PT educated pt on attempting to gain therapy time with additional session, pt eagerly agreed. Pt directed in WC mIukaility for 40' min A for straight path and mod A for turn with VC for attention to R side, increased activation of RUE to improve pushing of R wheel. PT noticed some minimal bleeding at IV site in R forearm, pt reported she noticed it had been bleeding slightly this AM but reported no noted incident of pulling or hitting on anything. PT notified her nurse, nurse assessed at that time and disconnected IV and cleared for further participation with PT. Pt agreeable however  just after this pt unable to attempt transfer training as with any pushing with RUE increased blood at IV noted. Pt returned to room, nursing present in room to assess and nursing completed care of IV site. Pt handed off to nursing, nursing agreeable no further needs for PT at this time.   Therapy Documentation Precautions:  Precautions Precautions: Fall Precaution Comments: decreased L visual field Restrictions Weight Bearing Restrictions: No General:   Vital Signs:   Pain: Pain Assessment Pain Scale: 0-10 Pain Score: 0-No pain Mobility:   Locomotion :    Trunk/Postural Assessment :    Balance:   Exercises:   Other Treatments:      Therapy/Group: Individual Therapy  HaleJunie Panning/2022, 10:54 AM

## 2021-01-10 NOTE — Progress Notes (Signed)
Occupational Therapy Session Note  Patient Details  Name: [name] MRN: [id number] Date of Birth: Apr 25, 1951  Today's Date: 01/10/2021 OT Individual Time: 5001-6429 OT Individual Time Calculation (min): 30 min    Short Term Goals: Week 2:  OT Short Term Goal 1 (Week 2): Pt will complete BSC/toilet transfer with mod A without lift. OT Short Term Goal 2 (Week 2): Pt will thread BLE into pants with min A + AE PRN. OT Short Term Goal 3 (Week 2): Pt will bathe LB with min A + AE PRN.  Skilled Therapeutic Interventions/Progress Updates:    Patient seated w/c level, denies pain and states that she is feeling better.  Sit to stand from w/c mod A - able to tolerate standing x3 for 2 minutes each, min A to maintain standing with weight shift activity.  W/c pushups with min A and cues for body position.  Utilized stedy for transfer to/from toilet - max A for clothing management and min A hygiene.  She remained seated in w/c at close of session, seat belt alarm set and call bell in hand.    Therapy Documentation Precautions:  Precautions Precautions: Fall Precaution Comments: decreased L visual field Restrictions Weight Bearing Restrictions: No   Therapy/Group: Individual Therapy  Carlos Levering 01/10/2021, 7:36 AM

## 2021-01-10 NOTE — Progress Notes (Signed)
[facility] PHYSICAL MEDICINE & REHABILITATION PROGRESS NOTE   Subjective/Complaints: [name] is feeling better today. She asks when her precautions can be discontinued. She is C diff negative so I have d/ced them. Her UC is positive for >100,000 GNR. Will transition from Zosyn/Vanc to macrobid BID, susceptibilities pending.   ROS: Denies CP, SOB, N/V/D, abdominal pain  Objective:   CT ABDOMEN PELVIS WO CONTRAST  Result Date: 01/09/2021 CLINICAL DATA:  Dilated loops of bowel on recent plain film EXAM: CT ABDOMEN AND PELVIS WITHOUT CONTRAST TECHNIQUE: Multidetector CT imaging of the abdomen and pelvis was performed following the standard protocol without IV contrast. COMPARISON:  01/08/2021 FINDINGS: Lower chest: Mild basilar atelectasis is noted. Hepatobiliary: No focal liver abnormality is seen. Status post cholecystectomy. No biliary dilatation. Pancreas: Unremarkable. No pancreatic ductal dilatation or surrounding inflammatory changes. Spleen: Normal in size without focal abnormality. Adrenals/Urinary Tract: Adrenal glands are within normal limits. Kidneys demonstrate a normal appearance bilaterally. No renal calculi or obstructive changes are seen. The ureters are within normal limits. The bladder is decompressed. Stomach/Bowel: Colon is predominately decompressed. Small bowel shows no significant obstructive change. Stomach is within normal limits. Vascular/Lymphatic: Aortic atherosclerosis. No enlarged abdominal or pelvic lymph nodes. Reproductive: Uterus and bilateral adnexa are unremarkable. Other: No abdominal wall hernia or abnormality. No abdominopelvic ascites. Musculoskeletal: No acute or significant osseous findings. IMPRESSION: No significant bowel dilatation is noted. Previously seen changes have resolved in the interval. No new focal abnormality is noted. Electronically Signed   By: Inez Catalina M.D.   On: 01/09/2021 14:36   DG Chest 2 View  Result Date: 01/08/2021 CLINICAL  DATA:  Fever EXAM: CHEST - 2 VIEW COMPARISON:  03/17/2019 FINDINGS: Frontal and lateral views of the chest demonstrate an unremarkable cardiac silhouette. No airspace disease, effusion, or pneumothorax. No acute bony abnormality. IMPRESSION: 1. No acute intrathoracic process. Electronically Signed   By: Randa Ngo M.D.   On: 01/08/2021 20:39   DG Abd 1 View  Result Date: 01/08/2021 CLINICAL DATA:  Vomiting. EXAM: ABDOMEN - 1 VIEW COMPARISON:  None. FINDINGS: There are mildly dilated loops of small bowel in the abdomen measuring up to approximately 3 cm. There is a large amount of stool in the ascending colon. There is no concerning radiopaque kidney stone. There are degenerative changes of the lumbar spine and bilateral hips. IMPRESSION: Mildly dilated loops of small bowel in the abdomen. Findings may represent an ileus or developing partial small bowel obstruction. Electronically Signed   By: Constance Holster M.D.   On: 01/08/2021 22:09   Recent Labs    01/08/21 1753 01/09/21 0050  WBC 10.4 10.9*  HGB 9.9* 9.7*  HCT 30.2* 29.0*  PLT 75* 66*   Recent Labs    01/08/21 1753 01/09/21 0050  NA 134* 132*  K 4.2 4.1  CL 102 101  CO2 23 20*  GLUCOSE 121* 137*  BUN 29* 29*  CREATININE 1.60* 1.67*  CALCIUM 8.8* 8.8*    Intake/Output Summary (Last 24 hours) at 01/10/2021 0959 Last data filed at 01/10/2021 1950 Gross per 24 hour  Intake 360 ml  Output 350 ml  Net 10 ml        Physical Exam: Vital Signs Blood pressure 116/81, pulse 77, temperature 98.3 F (36.8 C), temperature source Oral, resp. rate 17, height 5\' 8"  (1.727 m), weight 83.4 kg, SpO2 97 %. Gen: no distress, normal appearing HEENT: oral mucosa pink and moist, NCAT Cardio: Reg rate Chest: normal effort, normal rate of  breathing Abd: soft, non-distended Ext: no edema Psych: pleasant, normal affect Skin:  Left foot with dressing CDI Psych: Normal mood.  Normal behavior. Musc: No edema in extremities.  No  tenderness in extremities. Neuro: Alert Motor: LUE/LLE: 4+/5 proximal distal RUE: 4/5 proximal distal, unchanged RLE: Hip flexion, knee extension 2+/5, ankle dorsiflexion 0/5, unchanged  Assessment/Plan: 1. Functional deficits which require 3+ hours per day of interdisciplinary therapy in a comprehensive inpatient rehab setting.  Physiatrist is providing close team supervision and 24 hour management of active medical problems listed below.  Physiatrist and rehab team continue to assess barriers to discharge/monitor patient progress toward functional and medical goals  Care Tool:  Bathing    Body parts bathed by patient: Right arm,Left arm,Chest,Abdomen,Front perineal area,Right upper leg,Left upper leg,Face   Body parts bathed by helper: Right lower leg,Left lower leg,Buttocks     Bathing assist Assist Level: Dependent - Patient 0% (STS in stedy to reach buttocks)     Upper Body Dressing/Undressing Upper body dressing   What is the patient wearing?: Pull over shirt,Bra    Upper body assist Assist Level: Moderate Assistance - Patient 50 - 74%    Lower Body Dressing/Undressing Lower body dressing      What is the patient wearing?: Pants,Incontinence brief     Lower body assist Assist for lower body dressing: Total Assistance - Patient < 25% (bed level)     Toileting Toileting    Toileting assist Assist for toileting: Total Assistance - Patient < 25%     Transfers Chair/bed transfer  Transfers assist     Chair/bed transfer assist level: Total Assistance - Patient < 25% (Min A/ CGA using STEDY)     Locomotion Ambulation   Ambulation assist   Ambulation activity did not occur: Safety/medical concerns (motor control limitations; L knee pain with wt bearing)  Assist level: Maximal Assistance - Patient 25 - 49% Assistive device: Walker-rolling Max distance: 18 ft   Walk 10 feet activity   Assist  Walk 10 feet activity did not occur: Safety/medical  concerns  Assist level: Maximal Assistance - Patient 25 - 49% Assistive device: Walker-rolling   Walk 50 feet activity   Assist Walk 50 feet with 2 turns activity did not occur: Safety/medical concerns         Walk 150 feet activity   Assist Walk 150 feet activity did not occur: N/A         Walk 10 feet on uneven surface  activity   Assist Walk 10 feet on uneven surfaces activity did not occur: Safety/medical concerns (motor control limitations)         Wheelchair     Assist Will patient use wheelchair at discharge?: Yes (Per PT long term goals) Type of Wheelchair: Manual Wheelchair activity did not occur: Safety/medical concerns (time constraints due to toileting)  Wheelchair assist level: Moderate Assistance - Patient 50 - 74% Max wheelchair distance: 50    Wheelchair 50 feet with 2 turns activity    Assist        Assist Level: Moderate Assistance - Patient 50 - 74%   Wheelchair 150 feet activity     Assist      Assist Level: Total Assistance - Patient < 25%   Blood pressure 116/81, pulse 77, temperature 98.3 F (36.8 C), temperature source Oral, resp. rate 17, height 5\' 8"  (1.727 m), weight 83.4 kg, SpO2 97 %.  Medical Problem List and Plan: 1.Right leg weaknesssecondary to small acute infarct within the  anterior right frontal lobe. Multiple old infarcts and chronic small vessel ischemia. New RIght ACA infarct  Continue CIR   Spoke with daughter regarding her concerns of stroke due to generalized weakness  2. Antithrombotics: -DVT/anticoagulation: Transitioned to Eliquis on 1/30 -antiplatelet therapy: Aspirin 81 mg daily 3. Pain Management:Tylenol as needed thigh pain left likely muscular pain from exercise  sportscream  2/7: voltaren gel added for left knee 4. Mood:Aricept 10 mg daily, Lexapro 20 mg daily -antipsychotic agents: N/A 5. Neuropsych: This patientiscapable of making decisions on  herown behalf. 6. Skin/Wound Care:Routine skin checks 7. Fluids/Electrolytes/Nutrition:Routine in and outs 8. Antihospholipid antibody syndrome/thrombocytopenia. Follow-up hematology services.  Eliquis per neuro  9. Hypertension. Monitor with increased mobility  Cozaar 25mg  daily  2/7: BP is soft to 98/69. Has been low last several reads, decrease Cozaar to 12.5mg .   2/8: BP well controlled- continue to monitor for effects of yesterday's change.  10. Hypothyroidism. Synthroid 11. Hyperlipidemia. Lipitor 12. CKD stage III.   Creatinine 1.77 on 1/28, 1.67 on 2/7.  13. Diarrhea. Patient was treated for loose stools daily for the past few weeks. Plan outpatient follow-up GI. Basic stool studies including GI pathogen panel neg, C diff neg, discussed with daughter that now is not a good time to proceed with endoscopy due to recent CVA and anticoagulation  Continue Imodium as needed Reduced lipitor may be helping , also on Aricept since 11/29 this may also cough diarrhea , will hold and monitor   Improving 14.  Irregular heart rhythm EKG shows occasional PVC no treatment needed  15.  Spastic bladder due to CVA UA shows some WBC but urine cath , contaminated specimen , would reculture if dysuria occurs or fever  Trial oxybutnin increased to 5 mg 3 times daily  ?Improving 15.  Acute blood loss anemia  Hemoglobin 10.2 on 1/31, 9/7 on 2/7 16.  Thrombocytopenia  Platelets 100 on 1/31, 65 on 2/7.  17. Ileus/SBO: continue reglan, CT abdomen shows resolution. Diet has been restarted.  18. UTI: UC + for >100,000 GNR. Zosyn and Vanc d/ced and transitioned to Macrobid BID. Susceptibilities pending.  19. C diff negative: d/c precuations  LOS: 12 days A FACE TO FACE EVALUATION WAS PERFORMED  Roschelle Calandra P Akiya Morr 01/10/2021, 9:59 AM

## 2021-01-10 NOTE — Progress Notes (Signed)
Physical Therapy Session Note  Patient Details  Name: [name] MRN: [id number] Date of Birth: [birth year]  Today's Date: 01/10/2021 PT Individual Time:  0804-0900  PT Individual Time Calculation (min): 56 min   Short Term Goals: Week 2:  PT Short Term Goal 1 (Week 2): Pt will consistently transfer to [facility] with Min A. PT Short Term Goal 2 (Week 2): Pt will perform bed mobility with CGA. PT Short Term Goal 3 (Week 2): Pt will ambulate 65ft with Mod A using RW and +2 for safety PT Short Term Goal 4 (Week 2): Pt will propel WC 162ft with supervision assist. Week 3:     Skilled Therapeutic Interventions/Progress Updates:    Patient seated upright in w/c upon PT arrival with IV abx running and continued enteric precautions. Patient alert and agreeable to PT session. Patient denied pain throughout most of session with one brief note of pain in L knee with attempt to stand to RW on initial STS.   Therapeutic Activity: While awaiting pause of IV medications, pt completes functional activity at sink to work on sitting balance and activity tolerance. MD also arrives and states that she will most likely be taking pt off enteric precautions this morning. IV will remain for now until labs return re: need for abx.   Dressing performed Mod/ Max d/t IV line and for time.  Transfers: Initial STS attempted at RW from w/c with +2 assist and pt unable to reach upright stance with Max A +2 d/t stated pain in L knee. STEDY used and pt completes STS using BUE with CGA/ Min A  Neuromuscular Re-ed: NMR facilitated during session with focus on dynamic standing balance and RLE motor control/ planning. Pt guided in forward/ bkwd stepping x2 bouts. She requires Max A and AAROM for RLE movement and placement. +2 for standing balance. Minisquats performed with use of mirror and multimodal cueing for midline orientation. Improvement noted in maintaining midline as well as in quality of movement of minisquat. NMR  performed for improvements in motor control and coordination, balance, sequencing, judgement, and self confidence/ efficacy in performing all aspects of mobility at highest level of independence.   Patient seated in w/c at end of session with brakes locked, belt alarm set, and all needs within reach.    Therapy Documentation Precautions:  Precautions Precautions: Fall Precaution Comments: decreased L visual field Restrictions Weight Bearing Restrictions: No  Therapy/Group: Individual Therapy  [name] 01/10/2021, 6:05 PM

## 2021-01-11 DIAGNOSIS — I6612 Occlusion and stenosis of left anterior cerebral artery: Secondary | ICD-10-CM | POA: Diagnosis not present

## 2021-01-11 LAB — URINE CULTURE: Culture: >=100000 — AB

## 2021-01-11 MED ORDER — TRAZODONE HCL 50 MG PO TABS
100.0000 mg | ORAL_TABLET | Freq: Every day | ORAL | Status: DC
  Administered 2021-01-11 – 2021-01-19 (×9): 100 mg via ORAL
  Filled 2021-01-11 (×9): qty 2

## 2021-01-11 MED ORDER — SULFAMETHOXAZOLE-TRIMETHOPRIM 800-160 MG PO TABS
1.0000 | ORAL_TABLET | Freq: Two times a day (BID) | ORAL | Status: AC
  Administered 2021-01-11 – 2021-01-18 (×14): 1 via ORAL
  Filled 2021-01-11 (×15): qty 1

## 2021-01-11 NOTE — Progress Notes (Signed)
Physical Therapy Session Note  Patient Details  Name: [name] MRN: [id number] Date of Birth: [birth year]  Today's Date: 01/11/2021 PT Individual Time: 1300-1415 PT Individual Time Calculation (min): 75 min   Short Term Goals: Week 2:  PT Short Term Goal 1 (Week 2): Pt will consistently transfer to [facility] with Min A. PT Short Term Goal 2 (Week 2): Pt will perform bed mobility with CGA. PT Short Term Goal 3 (Week 2): Pt will ambulate 56ft with Mod A using RW and +2 for safety PT Short Term Goal 4 (Week 2): Pt will propel WC 165ft with supervision assist.  Skilled Therapeutic Interventions/Progress Updates:    Patient received sitting up in wc, agreeable to PT. She denies pain when asked. PT propelling patient in wc to therapy gym for time management and energy conservation. Sit <>stand x8 with MaxA and RW with verbal cues for anterior weight shift and equal weight distribution B. Patient with frequent LOB posterior R with no protective reflexes noted. Patient reports being able to feel when her weight is shifting inappropriately, but makes no attempt to correct this. Attempted standing pregait exercises, but patient unable to advance R LE despite multimodal cuing. She required frequent reminders for hand placement and task at hand throughout therapy session. Wc mobility x3ft with MinA and max verbal cues to engage R UE. Noted very small and inefficient strokes with B UE, R >L. Patient with difficulty coordinating B UE to complete turns and maintain straight path in wc. Seated LAQ to target with R LE with improved AROM noted, but required consistent verbal and tactile cues to complete task fully and accurately. Prolonged standing task with MaxA to come to stand and MaxA to remain standing with Max verbal cues to attend to appropriate alignment. Dual task with card game completed with very limited ability to safely dual task without max multimodal cues. Patient often prioritizing cognitive card  task over motor standing task. Patient returning to room in wc, ModA to transfer to bed via slideboard with [place] to return supine. Bed alarm on, call light within reach.   Therapy Documentation Precautions:  Precautions Precautions: Fall Precaution Comments: decreased L visual field Restrictions Weight Bearing Restrictions: No    Therapy/Group: Individual Therapy  Karoline Caldwell, PT, DPT, CBIS  01/11/2021, 7:51 AM

## 2021-01-11 NOTE — Progress Notes (Signed)
Patient ID: [name] [facility], female   DOB: 16-Jun-1951, 70 y.o.   MRN: [id number]  Team Conference Report to Patient/Family  Team Conference discussion was reviewed with the patient and caregiver, including goals, any changes in plan of care and target discharge date.  Patient and caregiver express understanding and are in agreement.  The patient has a target discharge date of 01/27/21.  [name] 01/11/2021, 12:30 PM

## 2021-01-11 NOTE — Patient Care Conference (Signed)
Inpatient RehabilitationTeam Conference and Plan of Care Update Date: 01/11/2021   Time: 10:14 AM    Patient Name: [name] [facility]      Medical Record Number: [id number]  Date of Birth: 09-17-1951 Sex: Female         Room/Bed: 4M07C/4M07C-01 Payor Info: Payor: Theme park manager MEDICARE / Plan: UHC MEDICARE / Product Type: *No Product type* /    Admit Date/Time:  12/29/2020  4:51 PM  Primary Diagnosis:  Embolism of left anterior cerebral artery  Hospital Problems: Principal Problem:   Embolism of left anterior cerebral artery Active Problems:   Ischemic cerebrovascular accident (CVA) of frontal lobe ([place])   Acute blood loss anemia   Neurogenic bladder   Fever    Expected Discharge Date: Expected Discharge Date: 01/27/21  Team Members Present: Physician leading conference: Dr. Leeroy Cha Care Coodinator Present: Dorien Chihuahua, RN, BSN, CRRN;Christina Sampson Goon, [place] Nurse Present: Mohammed Kindle, RN PT Present: Barrie Folk, PT OT Present: Other (comment) Providence Lanius, OT) [place] Coordinator present : Gunnar Fusi, SLP     Current Status/Progress Goal Weekly Team Focus  Bowel/Bladder   Patient is continent of B/B, LBM 01/10/21  Maintain continence of bladder/bowel  Assess and address toileting needs QS/PRN (Keep patient clean and dry and assesing for toileting needs encouraging use of toilet when needed.)   Swallow/Nutrition/ Hydration             ADL's   Set-up: grooming; S: UB bathing; Mod for seated UB dressing/LB bathing; Max to dep in stedy for LB ADLs; [place] RUE: IV; Hand: IV  min A  RUE NMR, selfcare retraining, balance retraining, activity tolerance, pt/fam edu, DME/AE edu, functional transfer retraining   Mobility             Communication             Safety/Cognition/ Behavioral Observations            Pain   Patient has reular/ to intermitte episodes to shoulder areas, and headache  Pain at equal to or less than 3.  Assess pain/discomfort  QS/PRN   Skin      Progressive healing.        Discharge Planning:  Discharging home with daughter. Goal to be placed in ALF LT, after d/c (due to Medicaid Pending)   Team Discussion: UTI treated, Ileus resolved and tolerating a regular diet. 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 Requires max assist to advance right LE. Min assist goals set for discharge and do not anticipate ambulation. Will be wheelchair functionally at discharge  *See Care Plan and progress notes for long and short-term goals.   Revisions to Treatment Plan:   Teaching Needs: Transfers, toileting, cues for self care, medications, etc.  Current Barriers to Discharge: Decreased caregiver support and Home enviroment access/layout. Limited care givers at discharge and husband recently discharged from the hospital.   Possible Resolutions to Barriers: Daughter to review ALF for long term for both patient and husband Recommend ramp for discharge due to steps/wheelchair mobility SNF recommended       Medical Summary Current Status: Has UTI being treated with IV cefepime, left foot ulcer, poor motor planning and attention, ilues has resolved, tolerating regular diet  Barriers to  Discharge: Medical stability;Wound care  Barriers to Discharge Comments: Has UTI being treated with IV cefepime, left foot ulcer, poor motor planning and attention, severe hemianopsia, insomnia Possible Resolutions to [employer] Focus: Follow up urine culture susceptibilities today and narrow antibiotics accordingly, hopefully d/c IV, continue wound care to left foot ulcer, increase trazodone dose to 100mg    Continued Need for Acute Rehabilitation  Level of Care: The patient requires daily medical management by a physician with specialized training in physical medicine and rehabilitation for the following reasons: Direction of a multidisciplinary physical rehabilitation program to maximize functional independence : Yes Medical management of patient stability for increased activity during participation in an intensive rehabilitation regime.: Yes Analysis of laboratory values and/or radiology reports with any subsequent need for medication adjustment and/or medical intervention. : Yes   I attest that I was present, lead the team conference, and concur with the assessment and plan of the team.   Dorien Chihuahua B 01/11/2021, 12:35 PM

## 2021-01-11 NOTE — NC FL2 (Signed)
[place] MEDICAID FL2 LEVEL OF CARE SCREENING TOOL     IDENTIFICATION  Patient Name: [name] [facility] Birthdate: 1951/09/10 Sex: female Admission Date (Current Location): 12/29/2020  [facility] and [place] Number:  Network engineer and Address:  [facility] [place], [place] [address] [place],  [address]      Provider Number:    Attending Physician Name and Address:  Charlett Blake, MD  Relative Name and Phone Number:  Ramiro Harvest [phone]    Current Level of Care: SNF Recommended Level of Care: [place] Prior Approval Number:    Date Approved/Denied:   PASRR Number: [id number]  Discharge Plan: SNF    Current Diagnoses: Patient Active Problem List   Diagnosis Date Noted  . Fever   . Acute blood loss anemia   . Neurogenic bladder   . 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 Ischemic cerebrovascular accident (CVA) of frontal lobe ([place]) 12/29/2020  . CVA (cerebral vascular accident) ([place]) 12/27/2020  . Acute CVA (cerebrovascular accident) ([place]) 12/26/2020  . Thrombocytopenia ([place]) 12/26/2020  . Antiphospholipid antibody syndrome ([place]) 02/10/2020  . Acute kidney failure ([place]) 11/04/2019  . Edema of lower extremity 11/04/2019  . Stage 3 chronic kidney disease ([place]) 11/04/2019  . Popliteal bursitis of left knee 08/25/2019  . Familial hypercholesterolemia 08/25/2019  . Renal insufficiency 08/25/2019  . Low hemoglobin 08/25/2019  . History of essential hypertension 08/25/2019  . Seasonal allergic rhinitis due to pollen 02/24/2019  . Age-related osteoporosis without current pathological fracture 01/20/2018  . Osteoporosis 11/12/2017  . Urinary incontinence 05/28/2017  . Knee pain 05/08/2017  . Muscle weakness 05/08/2017  . Sprain of MCL (medial collateral ligament) of knee 05/08/2017  . Adult hypothyroidism 10/23/2016  . Essential hypertension 10/23/2016  . Gastroesophageal reflux disease without  esophagitis 10/23/2016  . Recurrent major depressive disorder, in full remission ([place]) 10/23/2016  . Anticoagulant long-term use 10/23/2016    Orientation RESPIRATION BLADDER Height & Weight     Self,Time,Situation,Place  Normal Continent Weight: 183 lb 13.8 oz (83.4 kg) Height:  5\' 8"  (172.7 cm)  BEHAVIORAL SYMPTOMS/MOOD NEUROLOGICAL BOWEL NUTRITION STATUS      Continent Diet  AMBULATORY STATUS COMMUNICATION OF NEEDS Skin   Limited Assist Verbally Normal                       Personal Care Assistance Level of Assistance  Bathing,Dressing Bathing Assistance: Limited assistance   Dressing Assistance: Limited assistance     Functional Limitations Info             SPECIAL CARE FACTORS FREQUENCY                       Contractures      Additional Factors Info                  Current Medications (01/11/2021):  This is the current hospital active medication list Current Facility-Administered Medications  Medication Dose Route Frequency Provider Last Rate Last Admin  . 0.9 %  sodium chloride infusion   Intravenous Continuous Jamse Arn, MD   Stopped at 01/11/21 0900  . acetaminophen (TYLENOL) tablet 650 mg  650 mg Oral Q4H PRN Cathlyn Parsons, PA-C   650 mg at 01/09/21 2312   Or  . acetaminophen (TYLENOL) 160 MG/5ML solution 650 mg  650 mg Per Tube Q4H PRN Angiulli, Lavon Paganini, PA-C       Or  .  acetaminophen (TYLENOL) suppository 650 mg  650 mg Rectal Q4H PRN Cathlyn Parsons, PA-C   650 mg at 01/09/21 1454  . apixaban (ELIQUIS) tablet 5 mg  5 mg Oral BID Phillips Odor, MD   5 mg at 01/11/21 0733  . atorvastatin (LIPITOR) tablet 20 mg  20 mg Oral Daily Kirsteins, Luanna Salk, MD   20 mg at 01/11/21 0732  . diclofenac Sodium (VOLTAREN) 1 % topical gel 2 g  2 g Topical QID PRN Raulkar, Clide Deutscher, MD   2 g at 01/10/21 1655  . escitalopram (LEXAPRO) tablet 20 mg  20 mg Oral Daily Cathlyn Parsons, PA-C   20 mg at 01/11/21 4709  . famotidine (PEPCID)  tablet 20 mg  20 mg Oral Daily Cathlyn Parsons, PA-C   20 mg at 01/11/21 6283  . ferrous sulfate tablet 325 mg  325 mg Oral QHS Cathlyn Parsons, PA-C   325 mg at 01/10/21 2213  . levothyroxine (SYNTHROID) tablet 100 mcg  100 mcg Oral Q0600 Cathlyn Parsons, PA-C   100 mcg at 01/11/21 6629  . loperamide (IMODIUM) capsule 2 mg  2 mg Oral Q6H PRN Cathlyn Parsons, PA-C   2 mg at 01/04/21 0841  . metoCLOPramide (REGLAN) injection 5 mg  5 mg Intravenous TID AC & HS Jamse Arn, MD   5 mg at 01/10/21 2214  . montelukast (SINGULAIR) tablet 10 mg  10 mg Oral QHS Cathlyn Parsons, PA-C   10 mg at 01/10/21 2213  . Muscle Rub CREA   Topical BID PRN Kirsteins, Luanna Salk, MD      . neomycin-bacitracin-polymyxin (NEOSPORIN) ointment   Topical BID Charlett Blake, MD   Given at 01/09/21 2250  . ondansetron (ZOFRAN-ODT) disintegrating tablet 2 mg  2 mg Oral TID PRN Jamse Arn, MD   2 mg at 01/08/21 0742  . oxybutynin (DITROPAN) tablet 5 mg  5 mg Oral TID Charlett Blake, MD   5 mg at 01/11/21 4765  . senna-docusate (Senokot-S) tablet 1 tablet  1 tablet Oral QHS PRN Angiulli, Lavon Paganini, PA-C      . sulfamethoxazole-trimethoprim (BACTRIM DS) 800-160 MG per tablet 1 tablet  1 tablet Oral Q12H Hammons, Kimberly B, RPH      . traZODone (DESYREL) tablet 100 mg  100 mg Oral QHS Raulkar, Clide Deutscher, MD      . vitamin B-12 (CYANOCOBALAMIN) tablet 1,000 mcg  1,000 mcg Oral Daily Cathlyn Parsons, PA-C   1,000 mcg at 01/11/21 4650     Discharge Medications: Please see discharge summary for a list of discharge medications.  Relevant Imaging Results:  Relevant Lab Results:   Additional Information LTC Referral. Patient spouse receiving ST therapy, transitioning to LTC once complete. Daughter/SPouse would like patient in same room LTC.  [name]

## 2021-01-11 NOTE — Progress Notes (Signed)
Occupational Therapy Session Note  Patient Details  Name: [name] MRN: [id number] Date of Birth: Apr 19, 1951  Today's Date: 01/11/2021 OT Individual Time: 2482-5003 OT Individual Time Calculation (min): 64 min    Short Term Goals: Week 2:  OT Short Term Goal 1 (Week 2): Pt will complete BSC/toilet transfer with mod A without lift. OT Short Term Goal 2 (Week 2): Pt will thread BLE into pants with min A + AE PRN. OT Short Term Goal 3 (Week 2): Pt will bathe LB with min A + AE PRN.  Skilled Therapeutic Interventions/Progress Updates:    Pt received in w/c, agreeable to therapy. Session focus on showering, dressing, and seated grooming. Stedy transfer to TTB with CGA to power up. Doffed/donned shirt with close S. Doffed/donned bra with min A to do clasps. Doffed brief/pants with mod A to unthread BLE, STS with total A + 2 (pt 40%). Doffed socks/shoes with total A. Bathed UB with close S + min VCs to bathe R side, bathed LB with LH sponge + min A to bathe buttocks, STS in stedy. Stedy transfer to w/c. Donned new brief/pants with overall max A to thread RLE and to pull over B hips. Able to achieve figure four with LLE but not with RLE. STS to pull up pants with mod A. Donned socks with total A. Seated, dried and brushed hair with BUE for ~2 min before fatiguing. Set-up A to brush teeth.  Pt left in w/c with safety belt alarm engaged, call bell in reach, and all immediate needs met.    Therapy Documentation Precautions:  Precautions Precautions: Fall Precaution Comments: decreased L visual field Restrictions Weight Bearing Restrictions: No   Pain: Pain Assessment Pain Scale: 0-10 Pain Score: 0-No pain ADL: See Care Tool for more details.  Therapy/Group: Individual Therapy  Volanda Napoleon MS, OTR/L  01/11/2021, 12:06 PM

## 2021-01-11 NOTE — Progress Notes (Signed)
Patient ID: [name] [facility], female   DOB: [birth year], 70 y.o.   MRN: [id number]   SW called and attempted to schedule family education, no answer. Left VM.  [place], [place]

## 2021-01-11 NOTE — Progress Notes (Signed)
Patient ID: [name] [facility], female   DOB: 1951-05-10, 70 y.o.   MRN: [id number]   SW spoke with patient daughter in room with patient. Family has agreed to potentially d/c patient to SNF for LTC ([facility]). Patient and daughter plan for patient to share a LTC bed with spouse at facility. FL2 and referral sent to [facility], daughter informed. Patient in agreement with plans to d/c to SNF.  [place], [place]

## 2021-01-11 NOTE — Progress Notes (Signed)
Physical Therapy Session Note  Patient Details  Name: [name] MRN: [id number] Date of Birth: 19-Aug-1951  Today's Date: 01/11/2021 PT Individual Time: 0800-0915 PT Individual Time Calculation (min): 75 min   Short Term Goals: Week 2:  PT Short Term Goal 1 (Week 2): Pt will consistently transfer to [facility] with Min A. PT Short Term Goal 2 (Week 2): Pt will perform bed mobility with CGA. PT Short Term Goal 3 (Week 2): Pt will ambulate 79f with Mod A using RW and +2 for safety PT Short Term Goal 4 (Week 2): Pt will propel WC 10109fwith supervision assist.  Skilled Therapeutic Interventions/Progress Updates:   Pt received supine in bed and agreeable to PT. Supine>sit transfer with min-mod assist and cues for awareness of RUE/RLE. Sitting balance EOB x 10 minutes while PT assisted with uppoer and lower body dressing. RN disconnected IV at start of PT treatment. Upper bodt dressing with supervision assist. Lower body dressing with max assist for clothing management and mod assist for standing balance to pull pants to waist.   Stand pivot transfers to and from [facility] 2 throughout session with mod-max assist to achieve standing due to pain in the L knee and poor activation of the RLE. Sit<>stand also performed x 4 with mod-max assist intermittently due to poor motor planning and strong posterior bias.   Gait training in lite gait over treadmill 2 x 3 min at 0.5 mph with max assist for advancement of RLE throughout, as well weight shift to the L to allow to step through on the affected LE. Total of [id number]  Gait training over level surface x [id number] 5ft 32fh mod-max cues and assist from PT for weight shift L and advancement of RLE.   [facility] to [facility] and all needs met.             Therapy Documentation Precautions:  Precautions Precautions: Fall Precaution Comments: decreased L visual field Restrictions Weight Bearing  Restrictions: No   Pain:   denies   Therapy/Group: Individual Therapy  AustiLorie Phenix2022, 9:18 AM

## 2021-01-11 NOTE — Progress Notes (Signed)
[place] PHYSICAL MEDICINE & REHABILITATION PROGRESS NOTE   Subjective/Complaints: Discussed that she has a UTI and we are treating with Cefepime, susceptibilities pending today. She would like to get her IV out. Discussed that hopefully we can do this today based on her susceptibilities. Team conference today, will likely need MinA upon d/c    ROS: Denies CP, SOB, N/V/D, abdominal pain, denies pain from foot ulcer  Objective:   CT ABDOMEN PELVIS WO CONTRAST  Result Date: 01/09/2021 CLINICAL DATA:  Dilated loops of bowel on recent plain film EXAM: CT ABDOMEN AND PELVIS WITHOUT CONTRAST TECHNIQUE: Multidetector CT imaging of the abdomen and pelvis was performed following the standard protocol without IV contrast. COMPARISON:  01/08/2021 FINDINGS: Lower chest: Mild basilar atelectasis is noted. Hepatobiliary: No focal liver abnormality is seen. Status post cholecystectomy. No biliary dilatation. Pancreas: Unremarkable. No pancreatic ductal dilatation or surrounding inflammatory changes. Spleen: Normal in size without focal abnormality. Adrenals/Urinary Tract: Adrenal glands are within normal limits. Kidneys demonstrate a normal appearance bilaterally. No renal calculi or obstructive changes are seen. The ureters are within normal limits. The bladder is decompressed. Stomach/Bowel: Colon is predominately decompressed. Small bowel shows no significant obstructive change. Stomach is within normal limits. Vascular/Lymphatic: Aortic atherosclerosis. No enlarged abdominal or pelvic lymph nodes. Reproductive: Uterus and bilateral adnexa are unremarkable. Other: No abdominal wall hernia or abnormality. No abdominopelvic ascites. Musculoskeletal: No acute or significant osseous findings. IMPRESSION: No significant bowel dilatation is noted. Previously seen changes have resolved in the interval. No new focal abnormality is noted. Electronically Signed   By: Inez Catalina M.D.   On: 01/09/2021 14:36   Recent  Labs    01/08/21 1753 01/09/21 0050  WBC 10.4 10.9*  HGB 9.9* 9.7*  HCT 30.2* 29.0*  PLT 75* 66*   Recent Labs    01/08/21 1753 01/09/21 0050  NA 134* 132*  K 4.2 4.1  CL 102 101  CO2 23 20*  GLUCOSE 121* 137*  BUN 29* 29*  CREATININE 1.60* 1.67*  CALCIUM 8.8* 8.8*    Intake/Output Summary (Last 24 hours) at 01/11/2021 0958 Last data filed at 01/11/2021 0900 Gross per 24 hour  Intake 856 ml  Output 475 ml  Net 381 ml        Physical Exam: Vital Signs Blood pressure 121/77, pulse 72, temperature 98.6 F (37 C), temperature source Oral, resp. rate 18, height 5\' 8"  (1.727 m), weight 83.4 kg, SpO2 (!) 85 %. Gen: no distress, normal appearing HEENT: oral mucosa pink and moist, NCAT Cardio: Reg rate Chest: normal effort, normal rate of breathing Abd: soft, non-distended Ext: no edema Psych: pleasant, normal affect Skin:  Left foot with dressing CDI Psych: Normal mood.  Normal behavior. Musc: No edema in extremities.  No tenderness in extremities. Neuro: Alert Motor: LUE/LLE: 4+/5 proximal distal RUE: 4/5 proximal distal, unchanged RLE: Hip flexion, knee extension 2+/5, ankle dorsiflexion 0/5, unchanged  Assessment/Plan: 1. Functional deficits which require 3+ hours per day of interdisciplinary therapy in a comprehensive inpatient rehab setting.  Physiatrist is providing close team supervision and 24 hour management of active medical problems listed below.  Physiatrist and rehab team continue to assess barriers to discharge/monitor patient progress toward functional and medical goals  Care Tool:  Bathing    Body parts bathed by patient: Right arm,Left arm,Chest,Abdomen,Front perineal area,Right upper leg,Left upper leg,Face   Body parts bathed by helper: Right lower leg,Left lower leg,Buttocks     Bathing assist Assist Level: Dependent - Patient 0% (STS  in stedy to reach buttocks)     Upper Body Dressing/Undressing Upper body dressing   What is the  patient wearing?: Pull over shirt,Bra    Upper body assist Assist Level: Moderate Assistance - Patient 50 - 74%    Lower Body Dressing/Undressing Lower body dressing      What is the patient wearing?: Pants,Incontinence brief     Lower body assist Assist for lower body dressing: Total Assistance - Patient < 25% (bed level)     Toileting Toileting    Toileting assist Assist for toileting: Total Assistance - Patient < 25%     Transfers Chair/bed transfer  Transfers assist     Chair/bed transfer assist level: Minimal Assistance - Patient > 75% (SB)     Locomotion Ambulation   Ambulation assist   Ambulation activity did not occur: Safety/medical concerns (motor control limitations; L knee pain with wt bearing)  Assist level: Maximal Assistance - Patient 25 - 49% Assistive device: Walker-rolling Max distance: 18 ft   Walk 10 feet activity   Assist  Walk 10 feet activity did not occur: Safety/medical concerns  Assist level: Maximal Assistance - Patient 25 - 49% Assistive device: Walker-rolling   Walk 50 feet activity   Assist Walk 50 feet with 2 turns activity did not occur: Safety/medical concerns         Walk 150 feet activity   Assist Walk 150 feet activity did not occur: N/A         Walk 10 feet on uneven surface  activity   Assist Walk 10 feet on uneven surfaces activity did not occur: Safety/medical concerns (motor control limitations)         Wheelchair     Assist Will patient use wheelchair at discharge?: Yes (Per PT long term goals) Type of Wheelchair: Manual Wheelchair activity did not occur: Safety/medical concerns (time constraints due to toileting)  Wheelchair assist level: Moderate Assistance - Patient 50 - 74% Max wheelchair distance: 50    Wheelchair 50 feet with 2 turns activity    Assist        Assist Level: Moderate Assistance - Patient 50 - 74%   Wheelchair 150 feet activity     Assist       Assist Level: Total Assistance - Patient < 25%   Blood pressure 121/77, pulse 72, temperature 98.6 F (37 C), temperature source Oral, resp. rate 18, height 5\' 8"  (1.727 m), weight 83.4 kg, SpO2 (!) 85 %.  Medical Problem List and Plan: 1.Right leg weaknesssecondary to small acute infarct within the anterior right frontal lobe. Multiple old infarcts and chronic small vessel ischemia. New RIght ACA infarct  Continue CIR   -Interdisciplinary Team Conference today   Spoke with daughter regarding her concerns of stroke due to generalized weakness  2. Antithrombotics: -DVT/anticoagulation: Transitioned to Eliquis on 1/30 -antiplatelet therapy: Aspirin 81 mg daily 3. Pain Management:Tylenol as needed thigh pain left likely muscular pain from exercise  sportscream  2/7: voltaren gel added for left knee 4. Mood:Aricept 10 mg daily, Lexapro 20 mg daily -antipsychotic agents: N/A 5. Neuropsych: This patientiscapable of making decisions on herown behalf. 6. Skin/Wound Care:Routine skin checks. May d/c IV 7. Fluids/Electrolytes/Nutrition:Routine in and outs 8. Antihospholipid antibody syndrome/thrombocytopenia. Follow-up hematology services.  Eliquis per neuro  9. Hypertension. Monitor with increased mobility  Cozaar 25mg  daily  2/7: BP is soft to 98/69. Has been low last several reads, decrease Cozaar to 12.5mg .   2/9: BP well controlled to soft: stop Losartan  10. Hypothyroidism. Synthroid 11. Hyperlipidemia. Lipitor 12. CKD stage III.   Creatinine 1.77 on 1/28, 1.67 on 2/7.  13. Diarrhea. Patient was treated for loose stools daily for the past few weeks. Plan outpatient follow-up GI. Basic stool studies including GI pathogen panel neg, C diff neg, discussed with daughter that now is not a good time to proceed with endoscopy due to recent CVA and anticoagulation  Continue Imodium as needed Reduced lipitor may be helping , also on Aricept  since 11/29 this may also cough diarrhea , will hold and monitor   Improving 14.  Irregular heart rhythm EKG shows occasional PVC no treatment needed  15.  Spastic bladder due to CVA UA shows some WBC but urine cath , contaminated specimen , would reculture if dysuria occurs or fever  Trial oxybutnin increased to 5 mg 3 times daily  ?Improving 15.  Acute blood loss anemia  Hemoglobin 10.2 on 1/31, 9/7 on 2/7 16.  Thrombocytopenia  Platelets 100 on 1/31, 65 on 2/7.  17. Ileus/SBO: continue reglan, CT abdomen shows resolution. Diet has been restarted.  18. UTI: UC + for >100,000 GNR.   2/9: susceptibilities returned. Cefepime d/ced. Bactrim ordered.  19. C diff negative: d/c precuations  LOS: 13 days A FACE TO FACE EVALUATION WAS PERFORMED  Martha Clan P Dammon Makarewicz 01/11/2021, 9:58 AM

## 2021-01-12 ENCOUNTER — Inpatient Hospital Stay: Payer: Medicare Other | Admitting: Family Medicine

## 2021-01-12 DIAGNOSIS — I6612 Occlusion and stenosis of left anterior cerebral artery: Secondary | ICD-10-CM | POA: Diagnosis not present

## 2021-01-12 NOTE — Progress Notes (Signed)
PHYSICAL MEDICINE & REHABILITATION PROGRESS NOTE   Subjective/Complaints: Plan for potential d/c to SNF She is feeling well this morning Had a good therapy session Denies pain  ROS: Denies CP, SOB, N/V/D, abdominal pain, denies pain from foot ulcer  Objective:   No results found. No results for input(s): WBC, HGB, HCT, PLT in the last 72 hours. No results for input(s): NA, K, CL, CO2, GLUCOSE, BUN, CREATININE, CALCIUM in the last 72 hours.  Intake/Output Summary (Last 24 hours) at 01/12/2021 5284 Last data filed at 01/11/2021 2300 Gross per 24 hour  Intake 910 ml  Output 575 ml  Net 335 ml        Physical Exam: Vital Signs Blood pressure 112/63, pulse 70, temperature 98.3 F (36.8 C), resp. rate 18, height 5\' 8"  (1.727 m), weight 83.4 kg, SpO2 97 %. Gen: no distress, normal appearing HEENT: oral mucosa pink and moist, NCAT Cardio: Reg rate Chest: normal effort, normal rate of breathing Abd: soft, non-distended Ext: no edema Psych: pleasant, normal affect Skin: Left foot with dressing CDI Psych: Normal mood.  Normal behavior. Musc: No edema in extremities.  No tenderness in extremities. Neuro: Alert Motor: LUE/LLE: 4+/5 proximal distal RUE: 4/5 proximal distal, unchanged RLE: Hip flexion, knee extension 2+/5, ankle dorsiflexion 0/5, unchanged  Assessment/Plan: 1. Functional deficits which require 3+ hours per day of interdisciplinary therapy in a comprehensive inpatient rehab setting.  Physiatrist is providing close team supervision and 24 hour management of active medical problems listed below.  Physiatrist and rehab team continue to assess barriers to discharge/monitor patient progress toward functional and medical goals  Care Tool:  Bathing    Body parts bathed by patient: Right arm,Left arm,Chest,Abdomen,Front perineal area,Right upper leg,Left upper leg,Face,Right lower leg,Left lower leg   Body parts bathed by helper: Buttocks     Bathing  assist Assist Level: Dependent - Patient 0% (STS in stedy)     Upper Body Dressing/Undressing Upper body dressing   What is the patient wearing?: Pull over shirt,Bra    Upper body assist Assist Level: Minimal Assistance - Patient > 75%    Lower Body Dressing/Undressing Lower body dressing      What is the patient wearing?: Pants,Incontinence brief     Lower body assist Assist for lower body dressing: Maximal Assistance - Patient 25 - 49%     Toileting Toileting    Toileting assist Assist for toileting: Total Assistance - Patient < 25%     Transfers Chair/bed transfer  Transfers assist     Chair/bed transfer assist level: Moderate Assistance - Patient 50 - 74% (slideboard)     Locomotion Ambulation   Ambulation assist   Ambulation activity did not occur: Safety/medical concerns (motor control limitations; L knee pain with wt bearing)  Assist level: Maximal Assistance - Patient 25 - 49% Assistive device: Walker-rolling Max distance: 18 ft   Walk 10 feet activity   Assist  Walk 10 feet activity did not occur: Safety/medical concerns  Assist level: Maximal Assistance - Patient 25 - 49% Assistive device: Walker-rolling   Walk 50 feet activity   Assist Walk 50 feet with 2 turns activity did not occur: Safety/medical concerns         Walk 150 feet activity   Assist Walk 150 feet activity did not occur: N/A         Walk 10 feet on uneven surface  activity   Assist Walk 10 feet on uneven surfaces activity did not occur: Safety/medical concerns (motor  control limitations)         Wheelchair     Assist Will patient use wheelchair at discharge?: Yes Type of Wheelchair: Manual Wheelchair activity did not occur: Safety/medical concerns (time constraints due to toileting)  Wheelchair assist level: Minimal Assistance - Patient > 75% Max wheelchair distance: 75    Wheelchair 50 feet with 2 turns activity    Assist        Assist  Level: Minimal Assistance - Patient > 75%   Wheelchair 150 feet activity     Assist      Assist Level: Total Assistance - Patient < 25%   Blood pressure 112/63, pulse 70, temperature 98.3 F (36.8 C), resp. rate 18, height 5\' 8"  (1.727 m), weight 83.4 kg, SpO2 97 %.  Medical Problem List and Plan: 1.Right leg weaknesssecondary to small acute infarct within the anterior right frontal lobe. Multiple old infarcts and chronic small vessel ischemia. New RIght ACA infarct  Continue CIR   -Interdisciplinary Team Conference today   Spoke with daughter regarding her concerns of stroke due to generalized weakness  2. Antithrombotics: -DVT/anticoagulation: Transitioned to Eliquis on 1/30 -antiplatelet therapy: Aspirin 81 mg daily 3. Pain Management:Tylenol as needed thigh pain left likely muscular pain from exercise  sportscream  2/7: voltaren gel added for left knee  2/10: denies pain.  4. Mood:Aricept 10 mg daily, Lexapro 20 mg daily -antipsychotic agents: N/A 5. Neuropsych: This patientiscapable of making decisions on herown behalf. 6. Skin/Wound Care:Routine skin checks. May d/c IV 7. Fluids/Electrolytes/Nutrition:Routine in and outs 8. Antihospholipid antibody syndrome/thrombocytopenia. Follow-up hematology services.  Eliquis per neuro  9. Hypertension. Monitor with increased mobility  Cozaar 25mg  daily  2/7: BP is soft to 98/69. Has been low last several reads, decrease Cozaar to 12.5mg .   2/9: BP well controlled to soft: stop Losartan  2/10: BP remains well controlled off Losartan.  10. Hypothyroidism. Synthroid 11. Hyperlipidemia. Lipitor 12. CKD stage III.   Creatinine 1.77 on 1/28, 1.67 on 2/7.  13. Diarrhea. Patient was treated for loose stools daily for the past few weeks. Plan outpatient follow-up GI. Basic stool studies including GI pathogen panel neg, C diff neg, discussed with daughter that now is not a good time to  proceed with endoscopy due to recent CVA and anticoagulation  Continue Imodium as needed Reduced lipitor may be helping , also on Aricept since 11/29 this may also cough diarrhea , will hold and monitor   Improving 14.  Irregular heart rhythm EKG shows occasional PVC no treatment needed  2/10: HR has been well controlled- monitor TID  15.  Spastic bladder due to CVA UA shows some WBC but urine cath , contaminated specimen , would reculture if dysuria occurs or fever  Trial oxybutnin increased to 5 mg 3 times daily  ?Improving 15.  Acute blood loss anemia  Hemoglobin 10.2 on 1/31, 9/7 on 2/7 16.  Thrombocytopenia  Platelets 100 on 1/31, 65 on 2/7.  17. Ileus/SBO: continue reglan, CT abdomen shows resolution. Diet has been restarted.  18. UTI: UC + for >100,000 GNR.   2/9: susceptibilities returned. Cefepime d/ced. Bactrim ordered.  19. C diff negative: d/c precuations  LOS: 14 days A FACE TO FACE EVALUATION WAS PERFORMED  Clide Deutscher Thelma Viana 01/12/2021, 6:32 AM

## 2021-01-12 NOTE — Progress Notes (Signed)
Physical Therapy Session Note  Patient Details  Name: [name] MRN: [id number] Date of Birth: [birth year]  Today's Date: 01/12/2021 PT Individual Time: 0805-0900 PT Individual Time Calculation (min): 55 min   Short Term Goals: Week 2:  PT Short Term Goal 1 (Week 2): Pt will consistently transfer to [facility] with Min A. PT Short Term Goal 2 (Week 2): Pt will perform bed mobility with CGA. PT Short Term Goal 3 (Week 2): Pt will ambulate 2f with Mod A using RW and +2 for safety PT Short Term Goal 4 (Week 2): Pt will propel WC [id number] supervision assist.  Skilled Therapeutic Interventions/Progress Updates:   Pt received supine in bed and agreeable to PT. Rolling R and L with min A to assist NT with hygiene following incontinent stool. Supine>sit transfer with min assist and moderate cues for sequencing to bring BLE off bed, roll, and push to sitting with the RUE.   Upper and lower body dressing EOB   Stand [facility]ner [facility] to weight shift L and allow movement of the RLE, and UE support RW  [facility] in parallel bars x 4 with min assist and tactile cues to improve use of RLE to power in standing.   Blocked practice weight shift L with activation of flexion synergy to lift RLE off floor. moax progressing to mod assist to achieve full weight shift and activation of hip/knee flexors.   Gait training in parallel bars forward and reverse x [id number] with mod-max assist for proper sequencing to initiate hip/knee flexion first, prior to advancing RLE in either direction.  Additional  Gait training with RW and DF wrap x [id number] max progressing to mod assist for gait pattern as listed above. Pt able to advance RLE with step through 10% of ambulation on this day.   SB transfer to [facility] min assist and max cues for sequencing to improve push through BLE/BUE and clear gluteal surface for transfer. Patient returned to room and left sitting  [facility] call bell in reach and all needs met.         Therapy Documentation Precautions:  Precautions Precautions: Fall Precaution Comments: decreased L visual field Restrictions Weight Bearing Restrictions: No    Pain: denies  Therapy/Group: Individual Therapy  [name]0/2022, 9:09 AM

## 2021-01-12 NOTE — Progress Notes (Signed)
Physical Therapy Session Note  Patient Details  Name: [name] MRN: [id number] Date of Birth: 07-May-1951  Today's Date: 01/12/2021 PT Individual Time: 1030-1130 PT Individual Time Calculation (min): 60 min   Short Term Goals: Week 2:  PT Short Term Goal 1 (Week 2): Pt will consistently transfer to [facility] with Min A. PT Short Term Goal 2 (Week 2): Pt will perform bed mobility with CGA. PT Short Term Goal 3 (Week 2): Pt will ambulate 83ft with Mod A using RW and +2 for safety PT Short Term Goal 4 (Week 2): Pt will propel WC 158ft with supervision assist.  Skilled Therapeutic Interventions/Progress Updates:    Patient received sitting up in wc, agreeable to PT. She denies pain at rest, but reports increase in pain in L knee during sit <> stand transition. PT propelling patient in wc to therapy gym for time management and energy conservation. She performed sit <> stand x3 with MaxA. Mirror for visual feedback on posture. Up to [place] to remain standing and tactile cuing to attend to R knee to maintain appropriate extension in stance. Patient with R inattention when completing dual task often allowing R hand to fall off of walker and patient to develop R lateral lean despite use of mirror for visual feedback. Patient able to remain standing ~1 min before needing to sit. PT performing prolonged stretch to L knee to assist with pain management and applied KT tape in pattern to assist with knee pain. Patient returning to room, requesting to return to bed. ModA via slideboard. Bed alarm on, call light within reach.   Therapy Documentation Precautions:  Precautions Precautions: Fall Precaution Comments: decreased L visual field Restrictions Weight Bearing Restrictions: No    Therapy/Group: Individual Therapy  Karoline Caldwell, PT, DPT, CBIS  01/12/2021, 7:51 AM

## 2021-01-12 NOTE — Progress Notes (Signed)
Occupational Therapy Session Note  Patient Details  Name: [name] MRN: [id number] Date of Birth: Jan 28, 1951  Today's Date: 01/12/2021 OT Individual Time: 6578-4696 OT Individual Time Calculation (min): 73 min    Short Term Goals: Week 2:  OT Short Term Goal 1 (Week 2): Pt will complete BSC/toilet transfer with mod A without lift. OT Short Term Goal 2 (Week 2): Pt will thread BLE into pants with min A + AE PRN. OT Short Term Goal 3 (Week 2): Pt will bathe LB with min A + AE PRN.  Skilled Therapeutic Interventions/Progress Updates:    Pt received in w/c, agreeable to therapy. Session focus on RUE NMR/FMC in prep for improved ADL performance and LB dressing. Transported to gym in w/c, SB transfer x3 throughout session mat<>wc<>bed with overall min A to block B knees + facilitate forward trunk flexion, total A to place board. Seated on mat, participated in various [facility] activities, placing and removing small pegs/threading beads with overall increased time + min A to stabilize surface. Pt req frequent VCs to use RUE during challenging tasks. Overall, dropped ~5 pegs/beads with each activity. Reassessed 9HPT and grip strength with the following results:  R hand: 35, 37, 34; average of 35 lbs; 1 min 41 seconds. Scores significantly improved from previous scores of 13 lbs and being unable to complete 9HPT after 5+ min on 2/1.   Finally, threaded scrub pants over BLE with overall mod A to maintain figure 4 position with RLE and to remove BLE from pants. Pt able to achieve figure four with LLE. Attempted STS x 3, pt c/o of L knee pain, resolves with rest.  Donned B shoes with overall mod A to don R shoe.  On bed, sitting EOB > supine with mod A to adjust trunk/BLE in bed. Pt left semi-reclined in bed with bed alarm engaged, call bell in reach, and all immediate needs met.    Therapy Documentation Precautions:  Precautions Precautions: Fall Precaution Comments: decreased L visual  field Restrictions Weight Bearing Restrictions: No Pain: Pain Assessment Pain Scale: Faces Faces Pain Scale: Hurts little more Pain Type: Acute pain Pain Location: Knee Pain Orientation: Left Pain Onset: With Activity Pain Intervention(s): Rest ADL: See Care Tool for more details.   Therapy/Group: Individual Therapy  Volanda Napoleon MS, OTR/L  01/12/2021, 4:05 PM

## 2021-01-13 DIAGNOSIS — I6612 Occlusion and stenosis of left anterior cerebral artery: Secondary | ICD-10-CM | POA: Diagnosis not present

## 2021-01-13 LAB — CULTURE, BLOOD (ROUTINE X 2)
Culture: NO GROWTH
Culture: NO GROWTH

## 2021-01-13 MED ORDER — SALINE SPRAY 0.65 % NA SOLN
1.0000 | NASAL | Status: DC | PRN
  Administered 2021-01-13: 1 via NASAL
  Filled 2021-01-13: qty 44

## 2021-01-13 NOTE — Progress Notes (Signed)
[facility] PHYSICAL MEDICINE & REHABILITATION PROGRESS NOTE   Subjective/Complaints: No complaints this morning She feels that therapy is going well.  Denies pain Has been sleeping much better with increase in Trazodone.   ROS: Denies CP, SOB, N/V/D, abdominal pain, denies pain from foot ulcer  Objective:   No results found. No results for input(s): WBC, HGB, HCT, PLT in the last 72 hours. No results for input(s): NA, K, CL, CO2, GLUCOSE, BUN, CREATININE, CALCIUM in the last 72 hours.  Intake/Output Summary (Last 24 hours) at 01/13/2021 1146 Last data filed at 01/13/2021 0800 Gross per 24 hour  Intake 1040 ml  Output 725 ml  Net 315 ml        Physical Exam: Vital Signs Blood pressure 112/64, pulse 77, temperature 98.3 F (36.8 C), temperature source Oral, resp. rate 20, height 5\' 8"  (1.727 m), weight 83.4 kg, SpO2 97 %. Gen: no distress, normal appearing HEENT: oral mucosa pink and moist, NCAT Cardio: Reg rate Chest: normal effort, normal rate of breathing Abd: soft, non-distended Ext: no edema Psych: pleasant, normal affect Skin: Left foot with dressing CDI Psych: Normal mood.  Normal behavior. Musc: No edema in extremities.  No tenderness in extremities. Neuro: Alert Motor: LUE/LLE: 4+/5 proximal distal RUE: 4/5 proximal distal, unchanged RLE: Hip flexion, knee extension 2+/5, ankle dorsiflexion 0/5, unchanged  Assessment/Plan: 1. Functional deficits which require 3+ hours per day of interdisciplinary therapy in a comprehensive inpatient rehab setting.  Physiatrist is providing close team supervision and 24 hour management of active medical problems listed below.  Physiatrist and rehab team continue to assess barriers to discharge/monitor patient progress toward functional and medical goals  Care Tool:  Bathing    Body parts bathed by patient: Right arm,Left arm,Chest,Abdomen,Front perineal area,Right upper leg,Left upper leg,Face,Right lower leg,Left  lower leg   Body parts bathed by helper: Buttocks     Bathing assist Assist Level: Dependent - Patient 0% (STS in stedy)     Upper Body Dressing/Undressing Upper body dressing   What is the patient wearing?: Pull over shirt,Bra    Upper body assist Assist Level: Minimal Assistance - Patient > 75%    Lower Body Dressing/Undressing Lower body dressing      What is the patient wearing?: Pants,Incontinence brief     Lower body assist Assist for lower body dressing: Maximal Assistance - Patient 25 - 49%     Toileting Toileting    Toileting assist Assist for toileting: Total Assistance - Patient < 25%     Transfers Chair/bed transfer  Transfers assist     Chair/bed transfer assist level: Minimal Assistance - Patient > 75% (SB)     Locomotion Ambulation   Ambulation assist   Ambulation activity did not occur: Safety/medical concerns (motor control limitations; L knee pain with wt bearing)  Assist level: Maximal Assistance - Patient 25 - 49% Assistive device: Walker-rolling Max distance: 18 ft   Walk 10 feet activity   Assist  Walk 10 feet activity did not occur: Safety/medical concerns  Assist level: Maximal Assistance - Patient 25 - 49% Assistive device: Walker-rolling   Walk 50 feet activity   Assist Walk 50 feet with 2 turns activity did not occur: Safety/medical concerns         Walk 150 feet activity   Assist Walk 150 feet activity did not occur: N/A         Walk 10 feet on uneven surface  activity   Assist Walk 10 feet on uneven surfaces  activity did not occur: Safety/medical concerns (motor control limitations)         Wheelchair     Assist Will patient use wheelchair at discharge?: Yes Type of Wheelchair: Manual Wheelchair activity did not occur: Safety/medical concerns (time constraints due to toileting)  Wheelchair assist level: Minimal Assistance - Patient > 75% Max wheelchair distance: 75    Wheelchair 50 feet  with 2 turns activity    Assist        Assist Level: Minimal Assistance - Patient > 75%   Wheelchair 150 feet activity     Assist      Assist Level: Total Assistance - Patient < 25%   Blood pressure 112/64, pulse 77, temperature 98.3 F (36.8 C), temperature source Oral, resp. rate 20, height 5\' 8"  (1.727 m), weight 83.4 kg, SpO2 97 %.  Medical Problem List and Plan: 1.Right leg weaknesssecondary to small acute infarct within the anterior right frontal lobe. Multiple old infarcts and chronic small vessel ischemia. New RIght ACA infarct  Continue CIR    Spoke with daughter regarding her concerns of stroke due to generalized weakness  2. Antithrombotics: -DVT/anticoagulation: Transitioned to Eliquis on 1/30 -antiplatelet therapy: Aspirin 81 mg daily 3. Pain Management:Tylenol as needed thigh pain left likely muscular pain from exercise  sportscream  2/7: voltaren gel added for left knee  2/11: denies pain.  4. Mood:Aricept 10 mg daily, Lexapro 20 mg daily -antipsychotic agents: N/A 5. Neuropsych: This patientiscapable of making decisions on herown behalf. 6. Skin/Wound Care:Routine skin checks. May d/c IV 7. Fluids/Electrolytes/Nutrition:Routine in and outs 8. Antihospholipid antibody syndrome/thrombocytopenia. Follow-up hematology services.  Eliquis per neuro  9. Hypertension. Monitor with increased mobility  Cozaar 25mg  daily  2/7: BP is soft to 98/69. Has been low last several reads, decrease Cozaar to 12.5mg .   2/9: BP well controlled to soft: stop Losartan  2/11: BP remains well controlled off Losartan. Continue to monitor TID 10. Hypothyroidism. Synthroid 11. Hyperlipidemia. Lipitor 12. CKD stage III.   Creatinine 1.77 on 1/28, 1.67 on 2/7.  13. Diarrhea. Patient was treated for loose stools daily for the past few weeks. Plan outpatient follow-up GI. Basic stool studies including GI pathogen panel neg, C diff  neg, discussed with daughter that now is not a good time to proceed with endoscopy due to recent CVA and anticoagulation  Continue Imodium as needed Reduced lipitor may be helping , also on Aricept since 11/29 this may also cough diarrhea , will hold and monitor   Improving 14.  Irregular heart rhythm EKG shows occasional PVC no treatment needed  2/11: HR has been well controlled- continue to monitor TID  15.  Spastic bladder due to CVA UA shows some WBC but urine cath , contaminated specimen , would reculture if dysuria occurs or fever  Continue oxybutnin increased to 5 mg 3 times daily  ?Improving 15.  Acute blood loss anemia  Hemoglobin 10.2 on 1/31, 9/7 on 2/7 16.  Thrombocytopenia  Platelets 100 on 1/31, 65 on 2/7.  17. Ileus/SBO: continue reglan, CT abdomen shows resolution. Diet has been restarted.  18. UTI: UC + for >100,000 GNR.   2/9: susceptibilities returned. Cefepime d/ced. Bactrim ordered.  19. C diff negative: d/c precuations  LOS: 15 days A FACE TO FACE EVALUATION WAS PERFORMED  Martha Clan P Holman Bonsignore 01/13/2021, 11:46 AM

## 2021-01-13 NOTE — Progress Notes (Signed)
Physical Therapy Weekly Progress Note  Patient Details  Name: [name] MRN: [id number] Date of Birth: Mar 23, 1951  Beginning of progress report period: January 07, 2021 End of progress report period: January 13, 2021  Today's Date: 01/13/2021 PT Individual Time: 0102-7253 PT Individual Time Calculation (min): 75 min   Patient has met 1 of 4 short term goals. Progress seen in bed mobility, transfers, and ambulation. These STGs have been completely or partially upgraded. W/C mobility goal remains in place with pt continuing to progress. Pt's processing for motor planning and midline orientation is variable and has been a barrier to progress. L hemianopsia and R inattention also plays a role in her barrier to progress.   Patient continues to demonstrate the following deficits muscle weakness, decreased cardiorespiratoy endurance, impaired timing and sequencing, unbalanced muscle activation, motor apraxia, ataxia, decreased coordination and decreased motor planning, decreased visual perceptual skills and hemianopsia, decreased midline orientation, decreased attention to right, right side neglect and decreased motor planning, decreased awareness and delayed processing and decreased standing balance, decreased postural control, decreased balance strategies and hemipareisis and therefore will continue to benefit from skilled PT intervention to increase functional independence with mobility.  Patient progressing toward long term goals..  Continue plan of care.  PT Short Term Goals Week 3:  PT Short Term Goal 1 (Week 3): pt will transfer to [facility] with min assist consistently PT Short Term Goal 2 (Week 3): Pt will perform bed mobility with supervision PT Short Term Goal 3 (Week 3): Pt will ambulate 15f with Mod A using RW and +2 for safety PT Short Term Goal 4 (Week 3): Pt will propel WC 1072fwith supervision assist.  Skilled Therapeutic Interventions/Progress Updates:    Patient seated  upright in w/c upon PT arrival. Patient alert and agreeable to PT session. Patient with brief, intermittent pain complaint during session that is only experienced during rise to stand or with holding LLE in extension while seated in w/c. Resolves with repositioning.   Therapeutic Activity: Transfers: In pt's room, patient performed initial STS w/c--> RW with BUE push from armrests requiring MinA/ CGA and vc for fluid, controlled motion into upright posture. However, in different environment, with pt seated in front of 3" steps in therapy gym, pt instructed to rise to stand and she reaches out for handrails. Pt instructed to push-to-stand as she did in her room and she is unable to motor plan to complete in 4 attempts. Pt given vc to " just stand up" and completes with Min A. Continued STS attempts with Min A with push-to-stand technique and progression of one hand at a time to handrails with vc only. Descent to sit requires vc and Min A for control and safe hand progression.   Wheelchair Mobility:  Pt instructed in need for attention to RUE and RLE for assist to propel w/c in straight path. Pt will require focus and over-exaggeration of movement to R side for ALL mobility. Provided with vc throughout bout and propels w/c with intermittent Min A over 50 feet and hand over hand cueing for instruction. Provided verbal and visual cues for required movements and sequencing.   Neuromuscular Re-ed: NMR facilitated during session with focus on RLE flexion strength and control. Pt guided in standing RLE toe touches and forward lunges with R foot on 3" step as well as seated foot placement on and off of 3" step. She requires AAROM for foot placement activities. Forward lunges are performed with CGA initially and improves to  supervision. Standing bouts at 3-4 minutes each x3. NMR performed for improvements in motor control and coordination, balance, sequencing, judgement, and self confidence/ efficacy in performing  all aspects of mobility at highest level of independence.   Patient seated upright in w/c at end of session with brakes locked, belt alarm set, and all needs within reach. Pt informed that next session is in 48mn. Lunch is after next session and if she would like to sit in recliner for lunch, request from therapists to assist her to recliner after next session.   Therapy Documentation Precautions:  Precautions Precautions: Fall Precaution Comments: decreased L visual field Restrictions Weight Bearing Restrictions: No   Therapy/Group: Individual Therapy  JAlger Simons2/10/2021, 12:55 PM

## 2021-01-13 NOTE — Progress Notes (Signed)
Physical Therapy Session Note  Patient Details  Name: KASHONDA SARKISYAN MRN: [id number] Date of Birth: 1951-04-27  Today's Date: 01/13/2021 OT Co-Treatment Time: 1120-1200 OT Co-Treatment Time Calculation (min): 103mn  Short Term Goals: Week 2:  PT Short Term Goal 1 (Week 2): [facility] PT Short Term Goal 2 (Week 2): Pt will perform bed mobility with CGA. PT Short Term Goal 3 (Week 2): Pt will ambulate 33fwith Mod A using RW and +2 for safety PT Short Term Goal 4 (Week 2): Pt will propel WC 10063fith supervision assist.  Skilled Therapeutic Interventions/Progress Updates:   Pt received sitting in WC and agreeable to PT. Pt instructed in WC propulsion in room to and from sink with supervision assist and min-mod cues for attention to the RUE. OT then present for CO-treat. [facility] to the RLE. Gait training with mod assist and +2 for safety x 19f35fod-max cues for improved weight shift and decreased pushers response by keeping L heel on the ground to allow adequate weight shift to advance the RLE.   Sit<>stand transfers performed with min-mod assist and moderate cues for use of  sequencing and motor planning to improve symmetry of BLE.   Standing balance/tolerance while engaged in reaching task with BITS, visual scanning to target and sequence for number/letter. Min cues for improved visual scanning. Pt noted to engage lateral weight shift L with improve independence compared to prior sessions. Patient returned to room and left [facility] in reach and all needs met.          Therapy Documentation Precautions:  Precautions Precautions: Fall Precaution Comments: decreased L visual field Restrictions Weight Bearing Restrictions: No    Pain: Pain Assessment Pain Scale: Faces Faces Pain Scale: Hurts a little bit Pain Type: Acute pain Pain Location: Knee Pain Orientation:  Left Pain Onset: With Activity    Therapy/Group: Individual Therapy  [name]1/2022, 12:49 PM

## 2021-01-13 NOTE — Progress Notes (Signed)
Occupational Therapy Session Note  Patient Details  Name: [name] MRN: [id number] Date of Birth: 1951-10-11  Today's Date: 01/13/2021 OT Co-Treatment Time: 1130-1158 OT Co-Treatment Time Calculation (min): 28 min and 59 min   Short Term Goals: Week 2:  OT Short Term Goal 1 (Week 2): Pt will complete BSC/toilet transfer with mod A without lift. OT Short Term Goal 2 (Week 2): Pt will thread BLE into pants with min A + AE PRN. OT Short Term Goal 3 (Week 2): Pt will bathe LB with min A + AE PRN.  Skilled Therapeutic Interventions/Progress Updates:    Session 1 [phone]): Pt received in w/c with PT present for cotreat., agreeable to therapy. Session focus on standing activity tolerance, RUE NMR, and functional mobility in prep for improved ADL/func mobility performance. PT donned ACE wrap to RLE for DF assist. STS with min A from RW, amb ~45 ft with + 2 for safety, overall min A to facilitate WS onto LLE. Completed the following games on the BITS:  Game: Single Target, User Paced Accuracy: 63.49% Reaction Time: 2.96 Duration Time: 2 min Hits: 40 Game: Complex Array Accuracy: 66.67% Reaction Time: 6.74 Duration Time: 1 min 48 sec   Req seated rest break in between bouts, utilizing RUE to hit targets with overall improved accuracy and req less VCs to locate targets on R/L side of screen then in previous session. STS with mod A from w/c in prep for second bout, c/o of L knee, resolves with rest/in standing. Transported back to room in w/c and left in w/c with safety belt alarm engaged, call bell in reach, and all immediate needs met.    Session 2 [phone]): Pt received in w/c, agreeable to therapy. Pt finishing up eating lunch, reports having dropped all over her utensils and call bell. Took additional bite of food with spoon with RUE without built up handle, demonstrates insufficient grasp and minimal spillage of food. Cont to rec using red tube built up handle to improve  self-feeding performance. SB transfer wc<>toilet with overall min A towards R, mod A towards L. Pt cont to require max VCs to improve sequencing and motor planning during task (forward trunk flexion, push up from arm rests with BUE). Attempted STS from [facility] over toilet, unable to come into fully erect posture and require max A to control descent down to [facility]. C/o of L knee "locking up" and pain, reports pain with palpation when L knee extended. Provided ice pack for knee at end of session to assist L knee pain, RN made aware to monitor. SB transfer to bed with mod A to assist scoot and pivot 2/2 decreased motor planning and intermittent RLE extension synergy. Practiced threading BLE into pants with use of mod I techniques (reacher, figure 4, crossing ankles.) Pt cont to be unable to achieve and maintain RLE figure 4 position and requires max VC/demonstrational cuing to sequence task. Will cont to benefit from further reacher training to aid in LB dressing. Sitting EOB > supine with mod A to readjust trunk/BLE in bed. Doffed B shoes/socks with total A. Pt left semi-reclined in bed with bed alarm engaged, call bell in reach, and all immediate needs met.   Therapy Documentation Precautions:  Precautions Precautions: Fall Precaution Comments: decreased L visual field Restrictions Weight Bearing Restrictions: No Pain: Pain Assessment Pain Scale: Faces Faces Pain Scale: Hurts a little bit Pain Type: Acute pain Pain Location: Knee Pain Orientation: Left Pain Onset: With Activity ADL: See Care Tool  for more details.  Therapy/Group: Individual Therapy  Volanda Napoleon MS, OTR/L  01/13/2021, 12:23 PM

## 2021-01-14 DIAGNOSIS — D62 Acute posthemorrhagic anemia: Secondary | ICD-10-CM | POA: Diagnosis not present

## 2021-01-14 DIAGNOSIS — I6612 Occlusion and stenosis of left anterior cerebral artery: Secondary | ICD-10-CM | POA: Diagnosis not present

## 2021-01-14 NOTE — Progress Notes (Signed)
Occupational Therapy Weekly Progress Note  Patient Details  Name: [name] MRN: [id number] Date of Birth: 01/02/1951  Beginning of progress report period: January 07, 2021 End of progress report period: January 14, 2021  Patient has met 2 of 3 short term goals.  Pt has made moderate progress with OT this reporting period. Demonstrating improved activity tolerance, dynamic seated balance, and functional use of RUE, with 9HPT score improving from unable to complete to 1 min 41 seconds and grip strength improving from 13 lbs to 35 lbs. Pt however cont to be limited by L knee pain and decreased motor planning during functional transfers, LB ADL, and with use of AE.  Patient continues to demonstrate the following deficits: muscle weakness, decreased cardiorespiratoy endurance and impaired timing and sequencing, abnormal tone, unbalanced muscle activation, decreased coordination and decreased motor planning and therefore will continue to benefit from skilled OT intervention to enhance overall performance with BADL and Reduce care partner burden.  Patient progressing toward long term goals..  Continue plan of care.  OT Short Term Goals Week 3:  OT Short Term Goal 1 (Week 3): Pt will thread BLE into pants with min A + AE PRN. OT Short Term Goal 2 (Week 3): Pt will complete STS consistently with mod A in prep for standing ADL. OT Short Term Goal 3 (Week 3): Pt will complete LB clothing management during toileting with min A. OT Short Term Goal 4 (Week 3): Pt will complete peri care during toileting with min A.   Therapy Documentation Precautions:  Precautions Precautions: Fall Precaution Comments: decreased L visual field Restrictions Weight Bearing Restrictions: No   Volanda Napoleon MS, OTR/L  01/14/2021, 6:36 AM

## 2021-01-14 NOTE — Progress Notes (Signed)
Physical Therapy Session Note  Patient Details  Name: [name] MRN: [id number] Date of Birth: 1951-08-02  Today's Date: 01/14/2021 PT Individual Time: 0950-1038 PT Individual Time Calculation (min): 48 min   Short Term Goals: Week 3:  PT Short Term Goal 1 (Week 3): pt will transfer to [facility] with min assist consistently PT Short Term Goal 2 (Week 3): Pt will perform bed mobility with supervision PT Short Term Goal 3 (Week 3): Pt will ambulate 68ft with Mod A using RW and +2 for safety PT Short Term Goal 4 (Week 3): Pt will propel WC 158ft with supervision assist.  Skilled Therapeutic Interventions/Progress Updates:  Patient supine in bed upon PT arrival. Patient alert and agreeable to PT session. Patient c/o L knee pain with weight bearing during session. Alleviated with unweighting to LLE.   Therapeutic Activity: Bed Mobility: Patient performed supine --> sit with overall Mod A with vc for technique and maintaining balance once sitting EOB.  Transfers: Pt demonstrates decreased ability to perform transfers this day and requires Max A +2 to complete all STS and SPVT transfers despite max effort. Increased RLE flexion with decreased attention to RUE throughout all transfers. Decreased ability to perform backward scoot into w/c and is able to bring L hip back leaving R hip at front of chair. Max A to correct. Toilet transfer attempted with SPVT +2 requires multiple attempts. Requires use of STEDY to complete. Transfer with STEDY Min to Mod A +2. Second person for R lean/ balance. Extra time required for max A in toileting, pericare and dressing at Max A.  Neuromuscular Re-ed: Pt demonstrating increase R sided lean this day. Focus with pt to tend to midline orientation during modified stance in STEDY in performing sink activities. Continued inattention to R side and slow, drift toward R side throughout all activities requiring continuous vc/ tc and use of mirror to correct. Pt also  continues to struggle with focus conscious planning to perform mobility with RLE, however, is able to intermittently demonstrate some mobility with improved quality with subconscious, learned mobility.  Patient seated upright in w/c at end of session with brakes locked, chair alarm set, and all needs within reach.   Therapy Documentation Precautions:  Precautions Precautions: Fall Precaution Comments: decreased L visual field Restrictions Weight Bearing Restrictions: No  Therapy/Group: Individual Therapy  [name] 01/14/2021, 11:57 AM

## 2021-01-14 NOTE — Plan of Care (Signed)
  Problem: RH BOWEL ELIMINATION Goal: RH STG MANAGE BOWEL WITH ASSISTANCE Description: STG Manage Bowel with mod I Assistance. Outcome: Not Progressing; incontinence   Problem: RH BLADDER ELIMINATION Goal: RH STG MANAGE BLADDER WITH ASSISTANCE Description: STG Manage Bladder With mod I Assistance Outcome: Not Progressing; uti

## 2021-01-14 NOTE — Progress Notes (Signed)
Occupational Therapy Session Note  Patient Details  Name: [name] MRN: [id number] Date of Birth: 1951/09/13  Today's Date: 01/14/2021 OT Group Time: 1100-1200 OT Group Time Calculation (min): 60 min  Skilled Therapeutic Interventions/Progress Updates:    Pt engaged in therapeutic w/c level dance group focusing on patient choice, UE/LE strengthening, salience, activity tolerance, and social participation. Pt was guided through various dance-based exercises involving UEs/LEs and trunk. All music was selected by group members. Emphasis placed on [name] NMR and bilateral coordination. Pt often required hand over hand/manual cuing of the [name] UE/LE to involve the affected side during exercise. She tended to pause to figure out the motor plan during bimanual activity, did well with manual and visual cues, actively participative and receptive to cuing. At end of session RT returned pt to the room.    Therapy Documentation Precautions:  Precautions Precautions: Fall Precaution Comments: decreased L visual field Restrictions Weight Bearing Restrictions: No Vital Signs:   Pain: no s/s pain during session Pain Assessment Pain Scale: 0-10 Pain Score: 3  Pain Type: Acute pain Pain Location: Head Pain Descriptors / Indicators: Aching Pain Frequency: Occasional Pain Onset: On-going Pain Intervention(s): Medication (See eMAR) ADL: ADL Eating: Moderate assistance (for balance) Where Assessed-Eating: Edge of bed Grooming: Moderate assistance (for balance) Where Assessed-Grooming: Edge of bed Upper Body Bathing: Moderate assistance (for balance) Where Assessed-Upper Body Bathing: Edge of bed Lower Body Bathing: Dependent (stedy for STS) Where Assessed-Lower Body Bathing: Edge of bed Upper Body Dressing: Moderate assistance Where Assessed-Upper Body Dressing: Edge of bed Lower Body Dressing: Dependent (stedy for STS) Where Assessed-Lower Body Dressing: Edge of bed Toileting: Dependent  (stedy for STS/hygeine/clothing) Where Assessed-Toileting: Glass blower/designer: Dependent (stedy) Toilet Transfer Method: Other (comment) (stedy) Science writer: Bedside commode,Grab bars Gaffer Transfer: Dependent Social research officer, government Method: Other (comment) (stedy) Walk-In Shower Equipment: Radio broadcast assistant      Therapy/Group: Group Therapy  Alysha Doolan A Shaquisha Wynn 01/14/2021, 12:40 PM

## 2021-01-14 NOTE — Progress Notes (Signed)
[facility] PHYSICAL MEDICINE & REHABILITATION PROGRESS NOTE   Subjective/Complaints: No new issues. Sleeping well.   ROS: Patient denies fever, rash, sore throat, blurred vision, nausea, vomiting, diarrhea, cough, shortness of breath or chest pain, joint or back pain, headache, or mood change.   Objective:   No results found. No results for input(s): WBC, HGB, HCT, PLT in the last 72 hours. No results for input(s): NA, K, CL, CO2, GLUCOSE, BUN, CREATININE, CALCIUM in the last 72 hours.  Intake/Output Summary (Last 24 hours) at 01/14/2021 0955 Last data filed at 01/14/2021 0831 Gross per 24 hour  Intake 940 ml  Output 1375 ml  Net -435 ml        Physical Exam: Vital Signs Blood pressure 106/66, pulse 68, temperature 99.3 F (37.4 C), temperature source Oral, resp. rate 18, height 5\' 8"  (1.727 m), weight 83.4 kg, SpO2 95 %. Constitutional: No distress . Vital signs reviewed. HEENT: EOMI, oral membranes moist Neck: supple Cardiovascular: RRR without murmur. No JVD    Respiratory/Chest: CTA Bilaterally without wheezes or rales. Normal effort    GI/Abdomen: BS +, non-tender, non-distended Ext: no clubbing, cyanosis, or edema Psych: pleasant and cooperative Skin: Left foot with dressing CDI Psych: Normal mood.  Normal behavior. Musc: No edema in extremities.  No tenderness in extremities. Neuro: Alert and oriented x 3. Normal insight and awareness. Intact Memory. Normal language and speech. Cranial nerve exam unremarkable  Motor: LUE/LLE: 4+/5 proximal distal RUE: 4/5 proximal distal, unchanged RLE: Hip flexion, knee extension 2+/5, ankle dorsiflexion 0/5, unchanged  Assessment/Plan: 1. Functional deficits which require 3+ hours per day of interdisciplinary therapy in a comprehensive inpatient rehab setting.  Physiatrist is providing close team supervision and 24 hour management of active medical problems listed below.  Physiatrist and rehab team continue to assess  barriers to discharge/monitor patient progress toward functional and medical goals  Care Tool:  Bathing    Body parts bathed by patient: Right arm,Left arm,Chest,Abdomen,Front perineal area,Right upper leg,Left upper leg,Face,Right lower leg,Left lower leg   Body parts bathed by helper: Buttocks     Bathing assist Assist Level: Dependent - Patient 0% (STS in stedy)     Upper Body Dressing/Undressing Upper body dressing   What is the patient wearing?: Pull over shirt,Bra    Upper body assist Assist Level: Minimal Assistance - Patient > 75%    Lower Body Dressing/Undressing Lower body dressing      What is the patient wearing?: Pants,Incontinence brief     Lower body assist Assist for lower body dressing: Maximal Assistance - Patient 25 - 49%     Toileting Toileting    Toileting assist Assist for toileting: Total Assistance - Patient < 25%     Transfers Chair/bed transfer  Transfers assist     Chair/bed transfer assist level: Minimal Assistance - Patient > 75% (SB)     Locomotion Ambulation   Ambulation assist   Ambulation activity did not occur: Safety/medical concerns (motor control limitations; L knee pain with wt bearing)  Assist level: 2 helpers (+2 for safety) Assistive device: Walker-rolling Max distance: ~45 ft   Walk 10 feet activity   Assist  Walk 10 feet activity did not occur: Safety/medical concerns  Assist level: Maximal Assistance - Patient 25 - 49% Assistive device: Walker-rolling   Walk 50 feet activity   Assist Walk 50 feet with 2 turns activity did not occur: Safety/medical concerns         Walk 150 feet activity   Assist  Walk 150 feet activity did not occur: N/A         Walk 10 feet on uneven surface  activity   Assist Walk 10 feet on uneven surfaces activity did not occur: Safety/medical concerns (motor control limitations)         Wheelchair     Assist Will patient use wheelchair at discharge?:  Yes Type of Wheelchair: Manual Wheelchair activity did not occur: Safety/medical concerns (time constraints due to toileting)  Wheelchair assist level: Minimal Assistance - Patient > 75% Max wheelchair distance: 75    Wheelchair 50 feet with 2 turns activity    Assist        Assist Level: Minimal Assistance - Patient > 75%   Wheelchair 150 feet activity     Assist      Assist Level: Total Assistance - Patient < 25%   Blood pressure 106/66, pulse 68, temperature 99.3 F (37.4 C), temperature source Oral, resp. rate 18, height 5\' 8"  (1.727 m), weight 83.4 kg, SpO2 95 %.  Medical Problem List and Plan: 1.Right leg weaknesssecondary to small acute infarct within the anterior right frontal lobe. Multiple old infarcts and chronic small vessel ischemia. New RIght ACA infarct  Continue CIR  PT, OT     2. Antithrombotics: -DVT/anticoagulation: Transitioned to Eliquis on 1/30 -antiplatelet therapy: Aspirin 81 mg daily 3. Pain Management:Tylenol as needed thigh pain left likely muscular pain from exercise  sportscream  2/7: voltaren gel added for left knee  2/11-12: denies pain.  4. Mood:Aricept 10 mg daily, Lexapro 20 mg daily -antipsychotic agents: N/A 5. Neuropsych: This patientiscapable of making decisions on herown behalf. 6. Skin/Wound Care:Routine skin checks. May d/c IV 7. Fluids/Electrolytes/Nutrition:Routine in and outs 8. Antihospholipid antibody syndrome/thrombocytopenia. Follow-up hematology services.  Eliquis per neuro  9. Hypertension. Monitor with increased mobility  Cozaar 25mg  daily  2/7: BP is soft to 98/69. Has been low last several reads, decrease Cozaar to 12.5mg .   2/9: BP well controlled to soft: stop Losartan  2/11: BP remains well controlled off Losartan. Continue to monitor TID 10. Hypothyroidism. Synthroid 11. Hyperlipidemia. Lipitor 12. CKD stage III.   Creatinine 1.77 on 1/28, 1.67 on 2/7.   13. Diarrhea. Patient was treated for loose stools daily for the past few weeks. Plan outpatient follow-up GI. Basic stool studies including GI pathogen panel neg, C diff neg, discussed with daughter that now is not a good time to proceed with endoscopy due to recent CVA and anticoagulation  Continue Imodium as needed Reduced lipitor may be helping , also on Aricept since 11/29 this may also cough diarrhea , will hold and monitor   Improving 14.  Irregular heart rhythm EKG shows occasional PVC no treatment needed  2/11: HR has been well controlled- continue to monitor TID  15.  Spastic bladder due to CVA UA shows some WBC but urine cath , contaminated specimen , would reculture if dysuria occurs or fever  Continue oxybutnin increased to 5 mg 3 times daily  ?Improving 15.  Acute blood loss anemia  Hemoglobin 10.2 on 1/31, 9/7 on 2/7 16.  Thrombocytopenia  Platelets 100 on 1/31, 65 on 2/7.  17. Ileus/SBO: continue reglan, CT abdomen shows resolution. Diet has been restarted.  18. UTI: UC + for >100,000 GNR.   2/9: susceptibilities returned. Cefepime d/ced. Bactrim ordered.  19. C diff negative: d/c precuations  LOS: 16 days A FACE TO FACE EVALUATION WAS PERFORMED  Meredith Staggers 01/14/2021, 9:55 AM

## 2021-01-14 NOTE — Progress Notes (Signed)
Physical Therapy Session Note  Patient Details  Name: [name] MRN: [id number] Date of Birth: 03-24-1951  Today's Date: 01/14/2021 PT Individual Time: 1600-1657 PT Individual Time Calculation (min): 57 min   Short Term Goals: Week 3:  PT Short Term Goal 1 (Week 3): pt will transfer to [facility] with min assist consistently PT Short Term Goal 2 (Week 3): Pt will perform bed mobility with supervision PT Short Term Goal 3 (Week 3): Pt will ambulate 48f with Mod A using RW and +2 for safety PT Short Term Goal 4 (Week 3): Pt will propel WC 1063fwith supervision assist.  Skilled Therapeutic Interventions/Progress Updates:   Pt received supine in bed and agreeable to PT. Supine>sit transfer with min assist and moderate cues for sequencing. To come to sit through log roll. [facility] and SB set up. Moderate cues for improved sequencing and amplitude of movement to increased anterior weight shift and clearnce of gluteal surface from board.   Squat pivot transfer to nustep with min-mod assist and moderatre multimodal instruction for set up and initiation of movement through bobath facilitation.  nustep reciprocal movement training BUE/BLE x 5 min with mod assist throughout to improve ROM on the RLE to achieve full extension. BLE only x 3 min with min assist and cues for symmetry of movement and improved activation of flexion synergy when needed on the RLE.   Pt transported to entrance of [facility] mobility over unlevel cement sidewalk x [id number] [id number] min assist throughout to maintain straight path and improve use of RUE.   Patient returned to room and [facility] reach and all needs met.          Therapy Documentation Precautions:  Precautions Precautions: Fall Precaution Comments: decreased L visual field Restrictions Weight Bearing Restrictions: No    Vital Signs: Therapy Vitals Temp: 98 F (36.7 C) Temp Source: Oral Pulse  Rate: 83 Resp: 18 BP: 104/61 Patient Position (if appropriate): Sitting Oxygen Therapy SpO2: 100 % O2 Device: Room Air Pain: denies   Therapy/Group: Individual Therapy  [name]2/2022, 4:59 PM

## 2021-01-15 NOTE — Plan of Care (Signed)
  Problem: RH SKIN INTEGRITY Goal: RH STG SKIN FREE OF INFECTION/BREAKDOWN Description: Supervision Outcome: Progressing Problem: RH BLADDER ELIMINATION Goal: RH STG MANAGE BLADDER WITH ASSISTANCE Description: STG Manage Bladder With mod I Assistance Outcome: Not Progressing  Frequency per patient she is thgrsty all the time and drinks water all thge time

## 2021-01-15 NOTE — Discharge Instructions (Addendum)
STROKE/TIA DISCHARGE INSTRUCTIONS SMOKING Cigarette smoking nearly doubles your risk of having a stroke & is the single most alterable risk factor  If you smoke or have smoked in the last 12 months, you are advised to quit smoking for your health.  Most of the excess cardiovascular risk related to smoking disappears within a year of stopping.  Ask you doctor about anti-smoking medications   Quit Line: 1-800-QUIT NOW  Free Smoking Cessation Classes [phone]  CHOLESTEROL Know your levels; limit fat & cholesterol in your diet  Lipid Panel     Component Value Date/Time   CHOL 158 12/27/2020 0350   CHOL 196 02/10/2020 1005   TRIG 77 12/27/2020 0350   HDL 56 12/27/2020 0350   HDL 64 02/10/2020 1005   CHOLHDL 2.8 12/27/2020 0350   VLDL 15 12/27/2020 0350   LDLCALC 87 12/27/2020 0350   LDLCALC 111 (H) 02/10/2020 1005      Many patients benefit from treatment even if their cholesterol is at goal.  Goal: Total Cholesterol (CHOL) less than 160  Goal:  Triglycerides (TRIG) less than 150  Goal:  HDL greater than 40  Goal:  LDL (LDLCALC) less than 100   BLOOD PRESSURE American Stroke Association blood pressure target is less that 120/80 mm/Hg  Your discharge blood pressure is:  BP: 119/70  Monitor your blood pressure  Limit your salt and alcohol intake  Many individuals will require more than one medication for high blood pressure  DIABETES (A1c is a blood sugar average for last 3 months) Goal HGBA1c is under 7% (HBGA1c is blood sugar average for last 3 months)  Diabetes: No known diagnosis of diabetes    Lab Results  Component Value Date   HGBA1C 5.6 12/27/2020     Your HGBA1c can be lowered with medications, healthy diet, and exercise.  Check your blood sugar as directed by your physician  Call your physician if you experience unexplained or low blood sugars.  PHYSICAL ACTIVITY/REHABILITATION Goal is 30 minutes at least 4 days per week  Activity: Increase activity  slowly, Therapies: Physical Therapy: Home Health Return to work:   Activity decreases your risk of heart attack and stroke and makes your heart stronger.  It helps control your weight and blood pressure; helps you relax and can improve your mood.  Participate in a regular exercise program.  Talk with your doctor about the best form of exercise for you (dancing, walking, swimming, cycling).  DIET/WEIGHT Goal is to maintain a healthy weight  Your discharge diet is:  Diet Order            Diet Heart Room service appropriate? Yes; Fluid consistency: Thin  Diet effective now                 liquids Your height is:  Height: 5\' 8"  (172.7 cm) Your current weight is: Weight: 87.9 kg Your Body Mass Index (BMI) is:  BMI (Calculated): 29.47  Following the type of diet specifically designed for you will help prevent another stroke.  Your goal weight range is:    Your goal Body Mass Index (BMI) is 19-24.  Healthy food habits can help reduce 3 risk factors for stroke:  High cholesterol, hypertension, and excess weight.  RESOURCES Stroke/Support Group:  Call [phone]   STROKE EDUCATION PROVIDED/REVIEWED AND GIVEN TO PATIENT Stroke warning signs and symptoms How to activate emergency medical system (call 911). Medications prescribed at discharge. Need for follow-up after discharge. Personal risk factors for stroke. Pneumonia  vaccine given:  Flu vaccine given:  My questions have been answered, the writing is legible, and I understand these instructions.  I will adhere to these goals & educational materials that have been provided to me after my discharge from the hospital.   Inpatient Rehab Discharge Instructions  [place]ana Discharge date and time: No discharge date for patient encounter.   Activities/Precautions/ Functional Status: Activity: activity as tolerated Diet: regular diet Wound Care: Routine skin checks Functional status:  ___ No restrictions     ___ Walk up  steps independently ___ 24/7 supervision/assistance   ___ Walk up steps with assistance ___ Intermittent supervision/assistance  ___ Bathe/dress independently ___ Walk with walker     _x__ Bathe/dress with assistance ___ Walk Independently    ___ Shower independently ___ Walk with assistance    ___ Shower with assistance ___ No alcohol     ___ Return to work/school ________  Special Instructions:  No driving smoking or alcohol    My questions have been answered and I understand these instructions. I will adhere to these goals and the provided educational materials after my discharge from the hospital.  Patient/Caregiver Signature _______________________________ Date __________  Clinician Signature _______________________________________ Date __________  Please bring this form and your medication list with you to all your follow-up doctor's appointments.   Information on my medicine - ELIQUIS (apixaban)  This medication education was reviewed with me or my healthcare representative as part of my discharge preparation.   Why was Eliquis prescribed for you?  Eliquis was prescribed for you because you have had a cerebrovascular accident, a history of DVT, and antiphospholipid antibody syndrome.  What do You need to know about Eliquis ? Take your Eliquis TWICE DAILY - one tablet in the morning and one tablet in the evening with or without food.  It would be best to take the doses about the same time each day.  If you have difficulty swallowing the tablet whole please discuss with your pharmacist how to take the medication safely.  Take Eliquis exactly as prescribed by your doctor and DO NOT stop taking Eliquis without talking to the doctor who prescribed the medication.  Refill your prescription before you run out.  After discharge, you should have regular check-up appointments with your healthcare provider that is prescribing your Eliquis.  In the future your dose may need to  be changed if your kidney function or weight changes by a significant amount or as you get older.  What do you do if you miss a dose? If you miss a dose, take it as soon as you remember on the same day and resume taking twice daily.  Do not take more than one dose of ELIQUIS at the same time.  Important Safety Information A possible side effect of Eliquis is bleeding. You should call your healthcare provider right away if you experience any of the following: ? Bleeding from an injury or your nose that does not stop. ? Unusual colored urine (red or dark brown) or unusual colored stools (red or black). ? Unusual bruising for unknown reasons. ? A serious fall or if you hit your head (even if there is no bleeding).  Some medicines may interact with Eliquis and might increase your risk of bleeding or clotting while on Eliquis. To help avoid this, consult your healthcare provider or pharmacist prior to using any new prescription or non-prescription medications, including herbals, vitamins, non-steroidal anti-inflammatory drugs (NSAIDs) and supplements.  This website has more information  on Eliquis (apixaban): [email or web address].

## 2021-01-15 NOTE — Progress Notes (Signed)
Transitions of Care Pharmacist Note  [name] is a 70 y.o. female that has been diagnosed with CVA with residual left lateral visual field deficit, history of DVT and antiphospholipid antibody syndrome  and will be prescribed Eliquis (apixaban) at discharge.   Patient Education: I provided the following education on apixaban to the patient: How to take the medication Described what the medication is Signs of bleeding Signs/symptoms of VTE and stroke  Answered their questions  Discharge Medications Plan: The patient is not interested in filling their discharge medications with the Transitions of Care pharmacy at this time.   If the patient later decides they would like to have the Transitions of Care pharmacy fill their discharge medications, please call us at [phone]. Thank you.    Thank you,   Shauna Hugh, PharmD, [place]  PGY-1 Pharmacy Resident 01/15/2021 3:01 PM  Please check [email or web address] for unit-specific pharmacy phone numbers.

## 2021-01-16 DIAGNOSIS — I6612 Occlusion and stenosis of left anterior cerebral artery: Secondary | ICD-10-CM | POA: Diagnosis not present

## 2021-01-16 NOTE — Progress Notes (Addendum)
[place] PHYSICAL MEDICINE & REHABILITATION PROGRESS NOTE   Subjective/Complaints:  No issues overnite, no further diarrhea, urinary freq has diminished, sleeping better  ROS: Patient denies CP, SOB, N/V/D  Objective:   No results found. No results for input(s): WBC, HGB, HCT, PLT in the last 72 hours. No results for input(s): NA, K, CL, CO2, GLUCOSE, BUN, CREATININE, CALCIUM in the last 72 hours.  Intake/Output Summary (Last 24 hours) at 01/16/2021 0752 Last data filed at 01/16/2021 0700 Gross per 24 hour  Intake 480 ml  Output 2520 ml  Net -2040 ml        Physical Exam: Vital Signs Blood pressure 123/63, pulse 68, temperature 98 F (36.7 C), resp. rate 20, height 5\' 8"  (1.727 m), weight 83.4 kg, SpO2 100 %.   General: No acute distress Mood and affect are appropriate Heart: Regular rate and rhythm no rubs murmurs or extra sounds Lungs: Clear to auscultation, breathing unlabored, no rales or wheezes Abdomen: Positive bowel sounds, soft nontender to palpation, nondistended Extremities: No clubbing, cyanosis, or edema  Skin: Left foot with dressing CDI Psych: Normal mood.  Normal behavior. Musc: No edema in extremities.  No tenderness in extremities. Neuro: Alert and oriented x 3. Normal insight and awareness. Intact Memory. Normal language and speech. Cranial nerve exam unremarkable  Motor: LUE/LLE: 4+/5 proximal distal RUE: 4/5 proximal distal, unchanged RLE: Hip flexion, knee extension 2+/5, ankle dorsiflexion 0/5, unchanged  Assessment/Plan: 1. Functional deficits which require 3+ hours per day of interdisciplinary therapy in a comprehensive inpatient rehab setting.  Physiatrist is providing close team supervision and 24 hour management of active medical problems listed below.  Physiatrist and rehab team continue to assess barriers to discharge/monitor patient progress toward functional and medical goals  Care Tool:  Bathing    Body parts bathed by  patient: Right arm,Left arm,Chest,Abdomen,Front perineal area,Right upper leg,Left upper leg,Face,Right lower leg,Left lower leg   Body parts bathed by helper: Buttocks     Bathing assist Assist Level: Dependent - Patient 0% (STS in stedy)     Upper Body Dressing/Undressing Upper body dressing   What is the patient wearing?: Pull over shirt,Bra    Upper body assist Assist Level: Minimal Assistance - Patient > 75%    Lower Body Dressing/Undressing Lower body dressing      What is the patient wearing?: Pants,Incontinence brief     Lower body assist Assist for lower body dressing: Maximal Assistance - Patient 25 - 49%     Toileting Toileting    Toileting assist Assist for toileting: Total Assistance - Patient < 25%     Transfers Chair/bed transfer  Transfers assist     Chair/bed transfer assist level: Minimal Assistance - Patient > 75% (SB)     Locomotion Ambulation   Ambulation assist   Ambulation activity did not occur: Safety/medical concerns (motor control limitations; L knee pain with wt bearing)  Assist level: 2 helpers (+2 for safety) Assistive device: Walker-rolling Max distance: ~45 ft   Walk 10 feet activity   Assist  Walk 10 feet activity did not occur: Safety/medical concerns  Assist level: Maximal Assistance - Patient 25 - 49% Assistive device: Walker-rolling   Walk 50 feet activity   Assist Walk 50 feet with 2 turns activity did not occur: Safety/medical concerns         Walk 150 feet activity   Assist Walk 150 feet activity did not occur: N/A         Walk 10 feet on  uneven surface  activity   Assist Walk 10 feet on uneven surfaces activity did not occur: Safety/medical concerns (motor control limitations)         Wheelchair     Assist Will patient use wheelchair at discharge?: Yes Type of Wheelchair: Manual Wheelchair activity did not occur: Safety/medical concerns (time constraints due to  toileting)  Wheelchair assist level: Minimal Assistance - Patient > 75% Max wheelchair distance: 75    Wheelchair 50 feet with 2 turns activity    Assist        Assist Level: Minimal Assistance - Patient > 75%   Wheelchair 150 feet activity     Assist      Assist Level: Total Assistance - Patient < 25%   Blood pressure 123/63, pulse 68, temperature 98 F (36.7 C), resp. rate 20, height 5\' 8"  (1.727 m), weight 83.4 kg, SpO2 100 %.  Medical Problem List and Plan: 1.Right leg weaknesssecondary to small acute infarct within the anterior right frontal lobe. Multiple old infarcts and chronic small vessel ischemia. New RIght ACA infarct  Continue CIR  PT, OT, team conf in am      2. Antithrombotics: -DVT/anticoagulation: Transitioned to Eliquis on 1/30 -antiplatelet therapy: Aspirin 81 mg daily 3. Pain Management:Tylenol as needed thigh pain left likely muscular pain from exercise  sportscream  2/7: voltaren gel added for left knee  2/11-12: denies pain.  4. Mood:Aricept 10 mg daily, Lexapro 20 mg daily -antipsychotic agents: N/A 5. Neuropsych: This patientiscapable of making decisions on herown behalf. 6. Skin/Wound Care:Routine skin checks. May d/c IV 7. Fluids/Electrolytes/Nutrition:Routine in and outs 8. Antihospholipid antibody syndrome/thrombocytopenia. Follow-up hematology services.  Eliquis per neuro  9. Hypertension. Monitor with increased mobility  Cozaar 25mg  daily  2/7: BP is soft to 98/69. Has been low last several reads, decrease Cozaar to 12.5mg .   2/9: BP well controlled to soft: stop Losartan  2/15 controlled  Vitals:   01/15/21 1931 01/16/21 0323  BP: 130/68 123/63  Pulse: 75 68  Resp: 17 20  Temp: 98.6 F (37 C) 98 F (36.7 C)  SpO2: 100%    10. Hypothyroidism. Synthroid 11. Hyperlipidemia. Lipitor 12. CKD stage III.   Creatinine 1.77 on 1/28, 1.67 on 2/7.  13. Diarrhea. Patient was treated  for loose stools daily for the past few weeks. Plan outpatient follow-up GI. Basic stool studies including GI pathogen panel neg, C diff neg, discussed with daughter that now is not a good time to proceed with endoscopy due to recent CVA and anticoagulation  Continue Imodium as needed Reduced lipitor may be helping , also on Aricept since 11/29 this may also cough diarrhea , will hold and monitor   Improving off aricept ~2 soft BM per day  14.  Irregular heart rhythm EKG shows occasional PVC no treatment needed  2/11: HR has been well controlled- continue to monitor TID  15.  Spastic bladder due to CVA UA shows some WBC but urine cath , contaminated specimen , would reculture if dysuria occurs or fever  Continue oxybutnin increased to 5 mg 3 times daily  Improved  15.  Acute blood loss anemia  Hemoglobin 10.2 on 1/31, 9/7 on 2/7 16.  Thrombocytopenia  Platelets 100 on 1/31, 65 on 2/7.  17. Ileus/SBO: continue reglan, CT abdomen shows resolution. Diet has been restarted.  18. UTI: UC + for >100,000 GNR.   2/9: susceptibilities returned. Cefepime d/ced. Bactrim DS one po bid- to finish tomorrow    LOS: 18 days  A FACE TO FACE EVALUATION WAS PERFORMED  Charlett Blake 01/16/2021, 7:52 AM

## 2021-01-16 NOTE — Progress Notes (Signed)
Physical Therapy Session Note  Patient Details  Name: [name] MRN: [id number] Date of Birth: 03-31-1951  Today's Date: 01/16/2021 PT Individual Time: 1130-1200 and 1430-1530 PT Individual Time Calculation (min): 30 min and 60 min   Short Term Goals: Week 2:  PT Short Term Goal 1 (Week 2): Pt will consistently transfer to [facility] with Min A. PT Short Term Goal 1 - Progress (Week 2): Updated due to goal met PT Short Term Goal 2 (Week 2): Pt will perform bed mobility with CGA. PT Short Term Goal 2 - Progress (Week 2): Progressing toward goal PT Short Term Goal 3 (Week 2): Pt will ambulate 11f with Mod A using RW and +2 for safety PT Short Term Goal 3 - Progress (Week 2): Progressing toward goal PT Short Term Goal 4 (Week 2): Pt will propel WC 1067fwith supervision assist. PT Short Term Goal 4 - Progress (Week 2): Progressing toward goal Week 3:  PT Short Term Goal 1 (Week 3): [facility] PT Short Term Goal 2 (Week 3): Pt will perform bed mobility with supervision PT Short Term Goal 3 (Week 3): Pt will ambulate 3014fith Mod A using RW and +2 for safety PT Short Term Goal 4 (Week 3): Pt will propel WC [id number] supervision assist.  Skilled Therapeutic Interventions/Progress Updates:  Session 1: Patient seated upright in w/c upon PT arrival. Patient alert and agreeable to PT session. Patient c/o L knee pain during session. She received Tylenol from RN and Voltaren gel to L knee. She is also unaware that she has already had one PT session already this morning.   Therapeutic Activity: Transfers: Patient performed STS to RW from w/c requiring 3 attempts with Mod A to complete and unable to maintain stance d/t c/o L knee pain. Provided verbal cues for Bil foot placement, forward lean, and completing rise to stand. Unable to complete.   Neuromuscular Re-ed: NMR facilitated during session with focus oncoordination and motor control of RLE. Pt  guided in targeted movement into flexion and extension of R knee and hip for controlled placement of R foot to targets. NMR performed for improvements in motor control and coordination, balance, sequencing, judgement, and self confidence/ efficacy in performing all aspects of mobility at highest level of independence.   Patient seated upright at end of session with brakes locked, belt alarm set, and all needs within reach.   Session 2: Patient supine in bed on PT arrival to room. RN states that pt has requested to use restroom. RN has just located bariatric STEDY for use.  Patient alert and agreeable to PT session. Patient denied excessive L knee pain during session.  Therapeutic Activity: Transfers: Patient performed STS using STEDY with Min A to complete. Pt demos lean to R side throughout multiple stands to STEDY. Provided verbal cues for midline orienting, R side attention. Pt also performs STS outside of // bar in therapy gym. She requires 2 attempts but is able to perform with Mod A. Pt again leaning to R and requires use of mirror with vc to self-correct.   Neuromuscular Re-ed: NMR facilitated during session with focus on standing balance and seated balance. Pt guided in standing at mirror for visual feedback for midline orientation. Pt also guided in seated reaching activity requiring reach far outside of BOS and across midline for one UE then other in attempt to engage abdominals and RLE for seated stepping strategy.  NMR performed for improvements  in motor control and coordination, balance, sequencing, judgement, and self confidence/ efficacy in performing all aspects of mobility at highest level of independence.   Patient uses slide board with Min A to transfer w/c to bed at end of session. Requires Max A for placement, Min A with heavy vc to complete, and Max A for removal of SB. Max A for return to supine and Min A for move to [facility]. Pt supine in bed with brakes locked, bed alarm set, and  all needs within reach.  Therapy Documentation Precautions:  Precautions Precautions: Fall Precaution Comments: decreased L visual field Restrictions Weight Bearing Restrictions: No  Therapy/Group: Individual Therapy  [name] 01/16/2021, 5:54 PM

## 2021-01-16 NOTE — Progress Notes (Signed)
Occupational Therapy Session Note  Patient Details  Name: [name] MRN: [id number] Date of Birth: January 31, 1951  Today's Date: 01/16/2021 OT Individual Time: 1015-1100 OT Individual Time Calculation (min): 45 min    Short Term Goals: Week 3:  OT Short Term Goal 1 (Week 3): Pt will thread BLE into pants with min A + AE PRN. OT Short Term Goal 2 (Week 3): Pt will complete STS consistently with mod A in prep for standing ADL. OT Short Term Goal 3 (Week 3): Pt will complete LB clothing management during toileting with min A. OT Short Term Goal 4 (Week 3): Pt will complete peri care during toileting with min A.  Skilled Therapeutic Interventions/Progress Updates:    Patient seated in w/c, alert and ready for therapy session.. she denies pain and states that she would like to take a shower this session.  Attempted standing and short distance ambulation with RW to shower bench but she was unable to tolerate stance with stepping due to left knee pain.  Utilized stedy - she was able to stand from w/c and shower bench surfaces with CGA, cues for midline.  Bathing completed with min A for buttocks and bilateral lower legs, cues for balance.  Dressing completed w/c level, min A [place] shirt, mod/max A for incontinence brief and pants, min A socks, max A shoes.  She remained seated in w/c at close of session with seat belt alarm set and call bell in hand.    Therapy Documentation Precautions:  Precautions Precautions: Fall Precaution Comments: decreased L visual field Restrictions Weight Bearing Restrictions: No   Therapy/Group: Individual Therapy  Carlos Levering 01/16/2021, 7:40 AM

## 2021-01-16 NOTE — Progress Notes (Signed)
Physical Therapy Session Note  Patient Details  Name: [name] MRN: [id number] Date of Birth: 1951/05/30  Today's Date: 01/16/2021 PT Individual Time: 0800-0845 PT Individual Time Calculation (min): 45 min   Short Term Goals: Week 3:  PT Short Term Goal 1 (Week 3): pt will transfer to [facility] with min assist consistently PT Short Term Goal 2 (Week 3): Pt will perform bed mobility with supervision PT Short Term Goal 3 (Week 3): Pt will ambulate 13ft with Mod A using RW and +2 for safety PT Short Term Goal 4 (Week 3): Pt will propel WC 160ft with supervision assist.  Skilled Therapeutic Interventions/Progress Updates:    Patient received sitting up in bed, agreeable to PT. She denies pain. Patient able to don pants at bed level with MaxA and verbal cues for sequencing. Patient able to come sit edge of bed with MinA and vebral cues. She reported needing to use the bathroom. Stedy transfer into bathroom with CGA to come to stand. Continent of bowel and bladder. Needing ModA for clothing management and MaxA for perihygiene. Patient returning to wc via Stedy to complete morning ADLs at sink with set up assist. PT noting that patient puts forth minimal effort for all functional tasks prior to reporting that she "can't do it." She required Max encouragement to engage in functional tasks to the best of her ability. Patient remaining up in wc, seatbelt alarm on, call light within reach.   Therapy Documentation Precautions:  Precautions Precautions: Fall Precaution Comments: decreased L visual field Restrictions Weight Bearing Restrictions: No    Therapy/Group: Individual Therapy  Karoline Caldwell, PT, DPT, CBIS  01/16/2021, 7:44 AM

## 2021-01-16 NOTE — Progress Notes (Incomplete)
Physical Therapy Session Note  Patient Details  Name: [name] MRN: [id number] Date of Birth: 07-Aug-1951  Today's Date: 01/16/2021 PT Individual Time: 1130-1200 and 1430-1530 PT Individual Time Calculation (min): 30 min and 60 min   Short Term Goals: Week 2:  PT Short Term Goal 1 (Week 2): Pt will consistently transfer to [facility] with Min A. PT Short Term Goal 1 - Progress (Week 2): Updated due to goal met PT Short Term Goal 2 (Week 2): Pt will perform bed mobility with CGA. PT Short Term Goal 2 - Progress (Week 2): Progressing toward goal PT Short Term Goal 3 (Week 2): Pt will ambulate 83f with Mod A using RW and +2 for safety PT Short Term Goal 3 - Progress (Week 2): Progressing toward goal PT Short Term Goal 4 (Week 2): Pt will propel WC 1026fwith supervision assist. PT Short Term Goal 4 - Progress (Week 2): Progressing toward goal Week 3:  PT Short Term Goal 1 (Week 3): [facility] PT Short Term Goal 2 (Week 3): Pt will perform bed mobility with supervision PT Short Term Goal 3 (Week 3): Pt will ambulate 3049fith Mod A using RW and +2 for safety PT Short Term Goal 4 (Week 3): Pt will propel WC [id number] supervision assist.  Skilled Therapeutic Interventions/Progress Updates:  Session 1: Patient seated upright in w/c upon PT arrival. Patient alert and agreeable to PT session. Patient c/o L knee pain during session. She received Tylenol from RN and Voltaren gel to L knee. She is also unaware that she has already had one PT session already this morning.   Therapeutic Activity: Transfers: Patient performed STS to RW from w/c requiring 3 attempts with Mod A to complete and unable to maintain stance d/t c/o L knee pain. Provided verbal cues for Bil foot placement, forward lean, and completing rise to stand. Unable to complete.   Neuromuscular Re-ed: NMR facilitated during session with focus oncoordination and motor control of RLE. Pt  guided in targeted movement into flexion and extension of R knee and hip for controlled placement of R foot to targets. NMR performed for improvements in motor control and coordination, balance, sequencing, judgement, and self confidence/ efficacy in performing all aspects of mobility at highest level of independence.   Patient seated upright at end of session with brakes locked, belt alarm set, and all needs within reach.   Session 2: Patient supine in bed on PT arrival to room. RN states that pt has requested to use restroom. RN has just located bariatric STEDY for use.  Patient alert and agreeable to PT session. Patient denied excessive L knee pain during session.  Therapeutic Activity: Bed Mobility: Patient performed supine to/from sit with ***. Provided verbal cues for ***. Transfers: Patient performed sit to/from stand x*** with ***. Provided verbal cues for***.  Gait Training:  Patient ambulated *** feet using *** with ***. Ambulated with ***. Provided verbal cues for ***.  Wheelchair Mobility:  Patient propelled wheelchair *** feet with ***. Provided verbal cues for ***  Neuromuscular Re-ed: NMR facilitated during session with focus on***. Pt guided in ***. NMR performed for improvements in motor control and coordination, balance, sequencing, judgement, and self confidence/ efficacy in performing all aspects of mobility at highest level of independence.   Therapeutic Exercise: Patient performed the following exercises with verbal and tactile cues for proper technique. ***  Patient uses slide board with Min A to transfer w/c  to bed at end of session. Requires    with brakes locked, *** alarm set, and all needs within reach.  Therapy Documentation Precautions:  Precautions Precautions: Fall Precaution Comments: decreased L visual field Restrictions Weight Bearing Restrictions: No    Therapy/Group: Individual Therapy  [name] 01/16/2021, 5:54 PM

## 2021-01-17 MED ORDER — OXYBUTYNIN CHLORIDE ER 15 MG PO TB24
15.0000 mg | ORAL_TABLET | Freq: Every day | ORAL | Status: DC
  Administered 2021-01-17 – 2021-01-19 (×3): 15 mg via ORAL
  Filled 2021-01-17 (×4): qty 1

## 2021-01-17 NOTE — Progress Notes (Signed)
Occupational Therapy Session Note  Patient Details  Name: [name] MRN: [id number] Date of Birth: 09-08-1951  Today's Date: 01/17/2021 OT Individual Time: 1003-1059 OT Individual Time Calculation (min): 56 min    Short Term Goals: Week 3:  OT Short Term Goal 1 (Week 3): Pt will thread BLE into pants with min A + AE PRN. OT Short Term Goal 2 (Week 3): Pt will complete STS consistently with mod A in prep for standing ADL. OT Short Term Goal 3 (Week 3): Pt will complete LB clothing management during toileting with min A. OT Short Term Goal 4 (Week 3): Pt will complete peri care during toileting with min A.  Skilled Therapeutic Interventions/Progress Updates:    Pt received in recliner, agreeable to therapy. Session focus on LB AE. Pt c/o of headache, RN notified and administered rx during session. Attempting STS from recliner, unable to clear buttocks from surface. Stedy transfer to w/c with min A to power up. C/o of L knee pain during transition but resolves in stance. SB transfer to mat table with total A to place board, min A and min VCs to sequence transfer. Practiced using reacher to pick up objects and then looped a theraband around RLE x3, progressing from max A 2/2 decreased motor planning/problem solving to min A with increased time. Donned R sock with close S after demonstration and total A for set-up. Stedy transfer back to bed same manner as before. Doffed shoes with mod A to remove R shoe. C/o of L knee pain when cross B ankles to assist in LBD. Sitting EOB > supine with min A to readjust trunk in bed. Practiced using reacher to adjust covers as pt c/o she is unable to adjust them at night. Pt left semi-reclined in bed with bed alarm engaged, call bell in reach, and all immediate needs met.    Therapy Documentation Precautions:  Precautions Precautions: Fall Precaution Comments: decreased L visual field Restrictions Weight Bearing Restrictions: No Pain: Pain  Assessment Pain Scale: Faces Pain Score: 4  Faces Pain Scale: Hurts little more Pain Type: Acute pain Pain Location: Head Pain Orientation: Left;Anterior Pain Radiating Towards: head Pain Descriptors / Indicators: Discomfort;Headache Pain Frequency: Occasional Pain Onset: Gradual Pain Intervention(s): RN made aware ADL: See Care Tool for more details.  Therapy/Group: Individual Therapy  Volanda Napoleon MS, OTR/L  01/17/2021, 12:38 PM

## 2021-01-17 NOTE — Progress Notes (Signed)
Physical Therapy Session Note  Patient Details  Name: ALAZNE QUANT MRN: [id number] Date of Birth: 06-15-1951  Today's Date: 01/17/2021 PT Individual Time: 0800-0901 and 1302-1400 PT Individual Time Calculation (min): 61 min and 58 min  Short Term Goals: Week 3:  PT Short Term Goal 1 (Week 3): pt will transfer to [facility] with min assist consistently PT Short Term Goal 2 (Week 3): Pt will perform bed mobility with supervision PT Short Term Goal 3 (Week 3): Pt will ambulate 15ft with Mod A using RW and +2 for safety PT Short Term Goal 4 (Week 3): Pt will propel WC 171ft with supervision assist.  Skilled Therapeutic Interventions/Progress Updates:    Session 1: Patient supine in bed upon PT arrival and requiring toileting/ dressing. She states that breakfast has just arrived. Patient alert and agreeable to PT session. Patient with no pain complaint at start of session, but c/o pain in L knee during STS transfers that resolves with seated rest.  Therapeutic Activity: Bed Mobility: Patient performed supine --> sit with Min A. Provided verbal cues for technique and sequencing of movements with directions of leans for coordinated movement.  Transfers: Patient performed STS at [facility] initially d/t pt's recent regression in general mobility and balance. She is able to complete with CGA from elevated bed surface with minimal pain complaint re: L knee. She is able to hold her balance in standing with CGA and control her descent to modified seated position. Min A required to control descent to toilet. Max A for dressing for time. Max A for pericare. STS STEDY--> w/c with Min A to control descent. Pt instructed in sequencing of SPVT using RW to transfer from w/c to recliner prior to pt attempt. She requires 2 attempts to reach standing with improved foot positioning for 2nd attempt and able to reach standing with Min A +2 for balance. VC and tc for sequencing steps with RW mvmt requiring Max A for R hip/  knee/ ankle flexion to progress in pivot step. LLE progresses with vc for stepping.  Neuromuscular Re-ed: NMR facilitated during session with focus on sitting balance, maintaining midline orientation with unsupported sitting and standing midline orientation. Pt guided in seated activity in recliner reaching for objects on tray table. Pt must remain seated upright with back off back rest. She is able to maintain for up to 47min at a time d/t fatigue and low activity tolerance/ strength. Pt also guided in standing adjustment to midline using proprioceptive capabilities as well as vc and visual cues from therapist. NMR performed for improvements in motor control and coordination, balance, sequencing, judgement, and self confidence/ efficacy in performing all aspects of mobility at highest level of independence.   Patient seated upright in recliner at end of session with breakfast in front of her on tray table. Brakes are locked, chair alarm is set, and all needs within reach.    Session 2:   Patient supine in bed upon PT arrival. Patient alert and agreeable to PT session. Patient complained of 7/ 10 pain during ambulation this session. Nsg notified following session. At one point, pt states that her L knee feels as though it is "bone-on-bone".   Therapeutic Activity: Bed Mobility: Patient performed supine <> sit with CGA. She is able to bring BLE off of bed this session.  Provided verbal cues for forward leaning, squaring self to side of bed.  At end of session, she is able to bring LLE to bed surface with no pain or hesitation,  followed by RLE. Pt lowers UB to bed surface in exhaustion with CGA and is lying in a diagonal position across the bed. She is unable to self correct with only vc and requires CGA with vc to find neutral positioning.   Transfers: Patient performed STS and SPVT transfer bed <> w/c with Min/ Mod A +2. Some pain noted by pt in L knee, but not severe.   At end of session, pt uses  slide board for transfer w/c to bed. Max A for board placement. Max cues for hand placement. Pt is able to self initiate and cross over bridge to bed with vc for hand progression.  Completes transfer with CGA. Max A for board removal.   Gait Training:  Attempted to ambulate with pt utilizing LiteGait harness system. Initialized gait with treadmill increased to 0.8mph, however within 4 steps, pt's pain experienced with WB to L knee is unbearable and she requests to stop ambulation.  At this point in pt's progress in therapy, her L knee pain will prevent her from returning to functional ambulation.   Wheelchair Mobility:  Patient propelled wheelchair 15' using BUE requiring max multimodal cues for increasing RUE push. Pt reminded that continues to be inattentive to her RUE and RLE. So all movements requiring bilateral movements to complete will require an over exaggeration of movements from the RUE and the RLE because of her most recent stroke.  Neuromuscular Re-ed: NMR facilitated during session with focus on standing balance and equal WB to BLE. Pt guided in lateral weight shifting and R knee extension in weight bearing which she is able to perform this day. NMR performed for improvements in motor control and coordination, balance, sequencing, and judgement, for improved performance of all aspects of mobility at highest level of independence.   Patient supine in bed at end of session with brakes locked, bed alarm set, and all needs within reach.   Therapy Documentation Precautions:  Precautions Precautions: Fall Precaution Comments: decreased L visual field Restrictions Weight Bearing Restrictions: No   Therapy/Group: Individual Therapy  [name] 01/17/2021, 8:52 AM

## 2021-01-18 ENCOUNTER — Inpatient Hospital Stay (HOSPITAL_COMMUNITY): Payer: Medicare Other

## 2021-01-18 DIAGNOSIS — I6612 Occlusion and stenosis of left anterior cerebral artery: Secondary | ICD-10-CM | POA: Diagnosis not present

## 2021-01-18 IMAGING — CR DG KNEE 1-2V*L*
2 series · 2 of 2 positions shown · non-contrast
Comparison: None.

CLINICAL DATA: Left knee pain without recent injury.

EXAM:
LEFT KNEE - 1-2 VIEW

[knee ap]
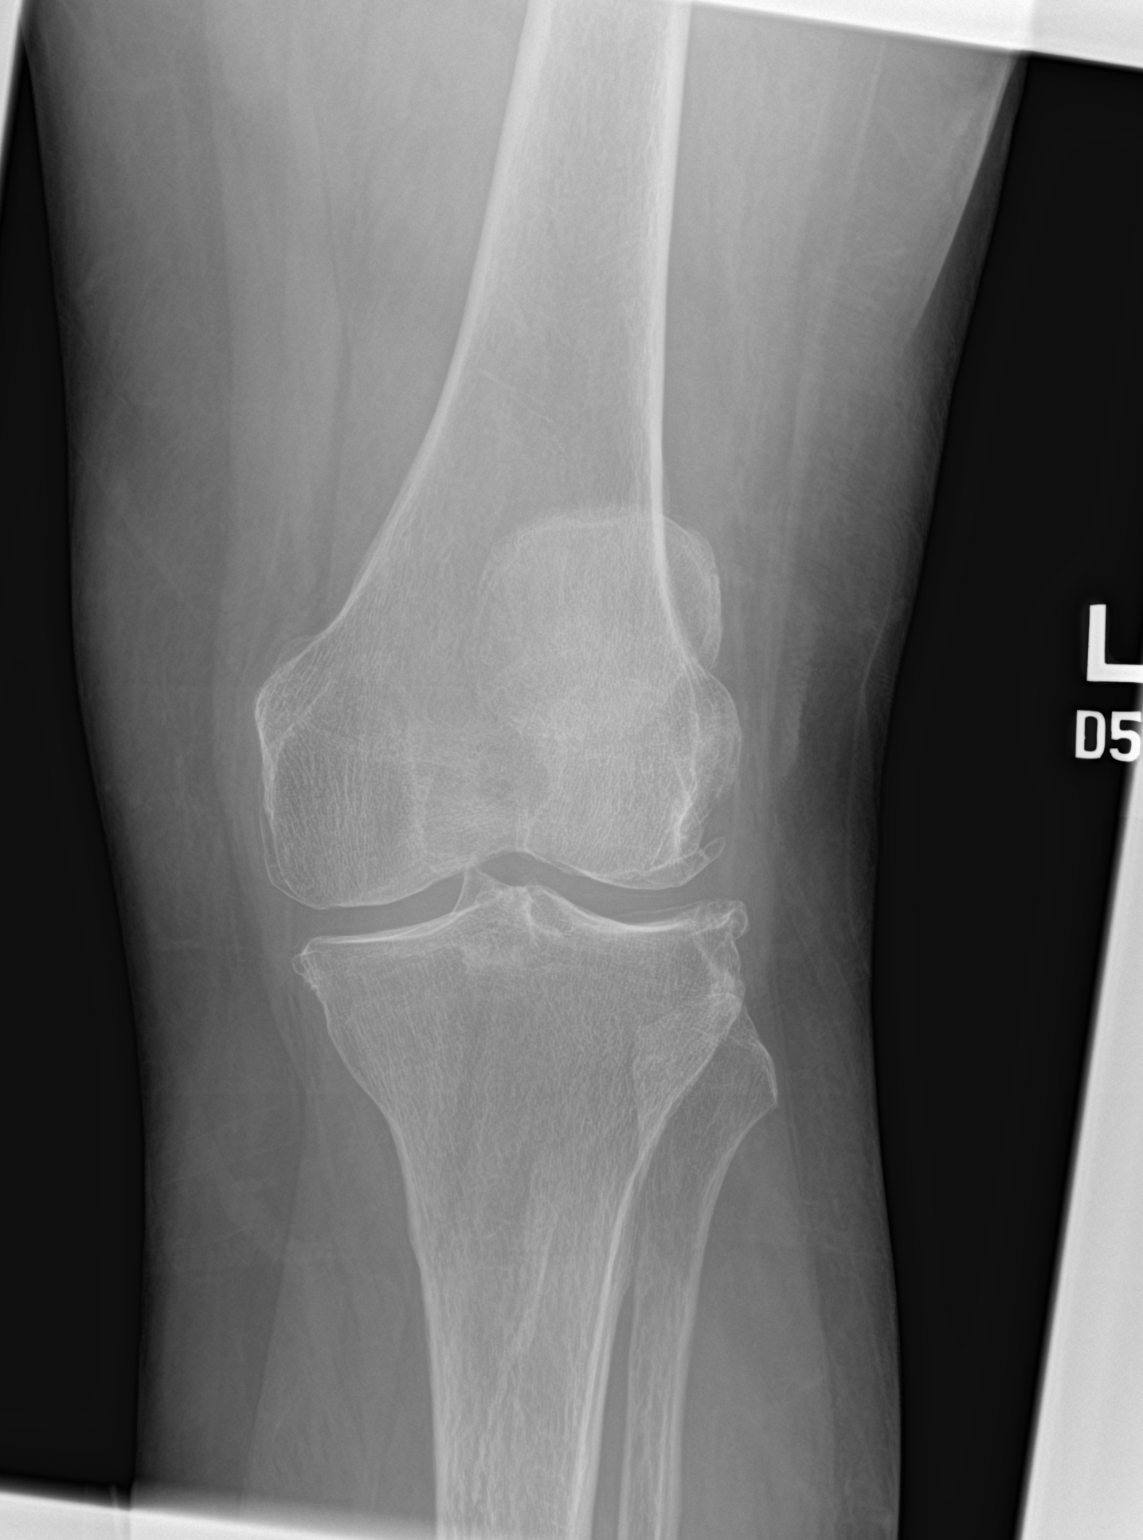

[knee lat]
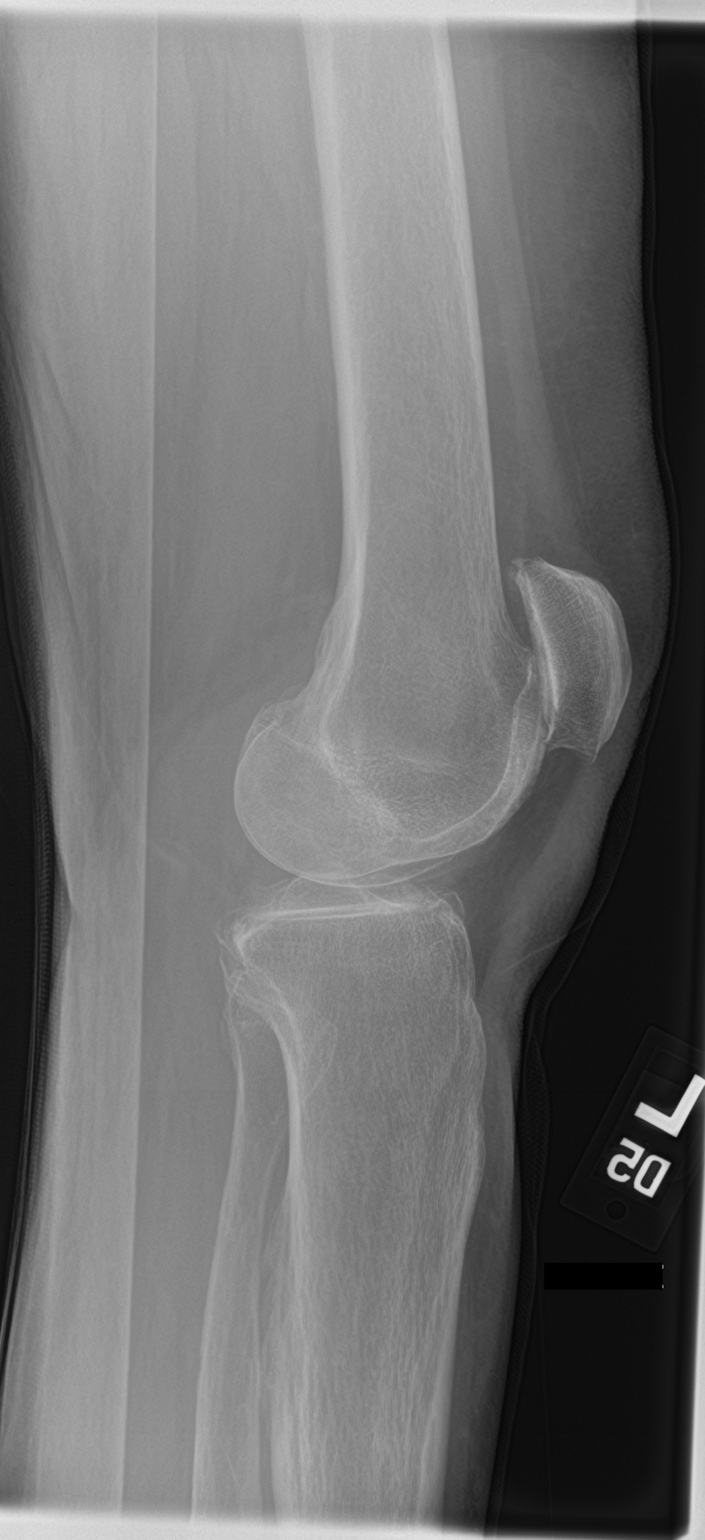

[2 of 2 positions shown; findings below may reference images not displayed]

FINDINGS: No evidence of fracture, dislocation, or joint effusion. Mild
osteophyte formation is seen involving the lateral joint space. Soft
tissues are unremarkable.
IMPRESSION: Mild degenerative joint disease. No acute abnormality seen in the
left knee.

## 2021-01-18 NOTE — Progress Notes (Signed)
Physical Therapy Session Note  Patient Details  Name: [name] MRN: [id number] Date of Birth: 20-Oct-1951  Today's Date: 01/18/2021 PT Individual Time: 1300-1400 PT Individual Time Calculation (min): 60 min    Short Term Goals: Week 3:  PT Short Term Goal 1 (Week 3): pt will transfer to [facility] with min assist consistently PT Short Term Goal 2 (Week 3): Pt will perform bed mobility with supervision PT Short Term Goal 3 (Week 3): Pt will ambulate 76f with Mod A using RW and +2 for safety PT Short Term Goal 4 (Week 3): Pt will propel WC 1058fwith supervision assist.  Skilled Therapeutic Interventions/Progress Updates:   Pt received supine in bed and agreeable to PT. Supine>sit transfer with min assist from slightly elevated position.  Sitting balance EOB with min assist progressing to supervision assist and min-mod cues for awareness of midline. SB transfer to and [facility] with min assist and moderate cues for sequencing to improve anterior weight shift prior to initiating lateral scoot. bobath facilitation technique required with fatigue.  Sit<>stand from elevated mat table with mod assist and cues for slight anterior positioning of the LLE to limit knee pain.  Static standing balance with RW 3 x 45sec with mod A to supervision assist and max cues for attention to the RLE and improve midline orientation to prevent R lateral Lean.  Gait training with RW 2 x 1044fith max assist progressing to mod assist and mod-max cues for posture, and sequencing to allow adequate weight shift to advance the RLE. No knee pain reported in gait training.  WC mobility x 54f12fth supervision assist max cues to attend to the RUE and increase ROM on the R shoulder to maintain straight trajectory .  Patient returned to room and performed stand pivot to recliner with mod assist and max cues for sequencing and AD management in turn. Pt left sitting in recliner with call bell in reach and all  needs met.         Therapy Documentation Precautions:  Precautions Precautions: Fall Precaution Comments: decreased L visual field Restrictions Weight Bearing Restrictions: No    Vital Signs: Therapy Vitals Temp: 98.2 F (36.8 C) Pulse Rate: 70 Resp: 20 BP: 111/70 Patient Position (if appropriate): Lying Oxygen Therapy SpO2: 98 % O2 Device: Room Air Pain: Pain Assessment Pain Scale: Faces Faces Pain Scale: Hurts little more Pain Type: Acute pain Pain Location: Knee Pain Orientation: Left Pain Onset: With Activity Pain Intervention(s): Rest    Therapy/Group: Individual Therapy  [name]6/2022, 3:04 PM

## 2021-01-18 NOTE — Patient Care Conference (Incomplete)
Inpatient RehabilitationTeam Conference and Plan of Care Update Date: 01/18/2021   Time: 2:10 AM    Patient Name: [name] [facility]      Medical Record Number: [id number]  Date of Birth: [birth year] Sex: Female         Room/Bed: 4M07C/4M07C-01 Payor Info: Payor: [employer] park [employer] MEDICARE / Plan: [employer] MEDICARE / Product Type: *No Product type* /    Admit Date/Time:  12/29/2020  4:51 PM  Primary Diagnosis:  Embolism of left anterior cerebral artery  Hospital Problems: Principal Problem:   Embolism of left anterior cerebral artery Active Problems:   Ischemic cerebrovascular accident (CVA) of frontal lobe ([place])   Acute blood loss anemia   Neurogenic bladder   Fever    Expected Discharge Date: Expected Discharge Date: 01/27/21  Team Members Present:       Current Status/Progress Goal Weekly Team Focus  Bowel/Bladder   (P) Pt intermittently incontinent. LBM 01/16/2021 Frequent urination  (P) Continent of B/B  (P) Assess   Swallow/Nutrition/ Hydration             ADL's   Set-up: grooming; S: UB bathing; Mod for seated UB dressing/LB bathing; Max to dep in stedy for LB ADLs; Brunstrum RUE: IV-V; Hand: IV; func t/fs limited by L knee pain  min A (may downgrade)  RUE NMR, selfcare retraining, balance retraining, activity tolerance, pt/fam edu, DME/AE edu, functional transfer retraining   Mobility             Communication             Safety/Cognition/ Behavioral Observations            Pain             Skin               Discharge Planning:  D/c to SNF LTC   Team Discussion: *** Patient on target to meet rehab goals: {IP REHAB YES/NO WITH IYMEBRAXE:94076}  *See Care Plan and progress notes for long and short-term goals.   Revisions to Treatment Plan:  ***  Teaching Needs: ***  Current Barriers to Discharge: {BARRIERS TO KGSUPJSRP:59458}  Possible Resolutions to Barriers: ***     Medical Summary               I attest that I was present, lead  the team conference, and concur with the assessment and plan of the team.   Consuello Masse 01/18/2021, 2:10 AM

## 2021-01-18 NOTE — Progress Notes (Signed)
Patient ID: [name] [facility], female   DOB: [birth year], 70 y.o.   MRN: [id number]  SW received follow up from [facility]. Facility has not determined if patient has LTC bed offer. Facility requested for patient information to be sent on hub to be reviewed as a ST rehab referral. If patient receives authorization for ST stay, this facility will then determine if patient qualifies for a LTC bed. SW informed patient daughter needs to discuss patient admission with business office manger Colletta [place], due to business office making final decision. If authorization is denied facility will request 30 day private pay upfront (about $8,000)  Sw informed daughter of steps needed to be taken for patient to be considered for LTC. Daughter reports she is unaware of this information and administrator did not discuss this with her. Daughter reports administrator informed her this morning that patient would have a LTC bed available at discharge. SW informed patient daughter to give a facility a call and speak with administrator, so that information can be passed along to the AD and SW if this information is true. SW will wait for follow up from facility and daughter  [name], [place]

## 2021-01-18 NOTE — Progress Notes (Addendum)
[facility] PHYSICAL MEDICINE & REHABILITATION PROGRESS NOTE   Subjective/Complaints:  Some Left knee cap pain, no falls , persists despite voltaren gel   ROS: Patient denies CP, SOB, N/V/D  Objective:   No results found. No results for input(s): WBC, HGB, HCT, PLT in the last 72 hours. No results for input(s): NA, K, CL, CO2, GLUCOSE, BUN, CREATININE, CALCIUM in the last 72 hours.  Intake/Output Summary (Last 24 hours) at 01/18/2021 0735 Last data filed at 01/18/2021 0519 Gross per 24 hour  Intake 657 ml  Output 740 ml  Net -83 ml        Physical Exam: Vital Signs Blood pressure 110/70, pulse 71, temperature 98.1 F (36.7 C), resp. rate 18, height '5\' 8"'  (1.727 m), weight 83.4 kg, SpO2 94 %.  General: No acute distress Mood and affect are appropriate Heart: Regular rate and rhythm no rubs murmurs or extra sounds Lungs: Clear to auscultation, breathing unlabored, no rales or wheezes Abdomen: Positive bowel sounds, soft nontender to palpation, nondistended Extremities: No clubbing, cyanosis, or edema  Skin: Left foot with dressing CDI Psych: Normal mood.  Normal behavior. Musc: No edema in extremities.  No tenderness in extremities., left knee without tenderness or effusion  Neuro: Alert and oriented x 3. Normal insight and awareness. Intact Memory. Normal language and speech. Cranial nerve exam unremarkable  Motor: LUE/LLE: 4+/5 proximal distal RUE: 4/5 proximal distal, unchanged RLE: Hip flexion, knee extension 2+/5, ankle dorsiflexion 0/5, unchanged  Assessment/Plan: 1. Functional deficits which require 3+ hours per day of interdisciplinary therapy in a comprehensive inpatient rehab setting.  Physiatrist is providing close team supervision and 24 hour management of active medical problems listed below.  Physiatrist and rehab team continue to assess barriers to discharge/monitor patient progress toward functional and medical goals  Care Tool:  Bathing    Body  parts bathed by patient: Right arm,Left arm,Chest,Abdomen,Front perineal area,Right upper leg,Left upper leg,Face,Right lower leg,Left lower leg   Body parts bathed by helper: Buttocks     Bathing assist Assist Level: Dependent - Patient 0% (STS in stedy)     Upper Body Dressing/Undressing Upper body dressing   What is the patient wearing?: Pull over shirt,Bra    Upper body assist Assist Level: Minimal Assistance - Patient > 75%    Lower Body Dressing/Undressing Lower body dressing      What is the patient wearing?: Pants,Incontinence brief     Lower body assist Assist for lower body dressing: Maximal Assistance - Patient 25 - 49%     Toileting Toileting    Toileting assist Assist for toileting: Total Assistance - Patient < 25%     Transfers Chair/bed transfer  Transfers assist     Chair/bed transfer assist level: Dependent - Patient 0% (stedy)     Locomotion Ambulation   Ambulation assist   Ambulation activity did not occur: Safety/medical concerns (motor control limitations; L knee pain with wt bearing)  Assist level: 2 helpers (+2 for safety) Assistive device: Walker-rolling Max distance: ~45 ft   Walk 10 feet activity   Assist  Walk 10 feet activity did not occur: Safety/medical concerns  Assist level: Maximal Assistance - Patient 25 - 49% Assistive device: Walker-rolling   Walk 50 feet activity   Assist Walk 50 feet with 2 turns activity did not occur: Safety/medical concerns         Walk 150 feet activity   Assist Walk 150 feet activity did not occur: N/A  Walk 10 feet on uneven surface  activity   Assist Walk 10 feet on uneven surfaces activity did not occur: Safety/medical concerns (motor control limitations)         Wheelchair     Assist Will patient use wheelchair at discharge?: Yes Type of Wheelchair: Manual Wheelchair activity did not occur: Safety/medical concerns (time constraints due to  toileting)  Wheelchair assist level: Minimal Assistance - Patient > 75% Max wheelchair distance: 75    Wheelchair 50 feet with 2 turns activity    Assist        Assist Level: Minimal Assistance - Patient > 75%   Wheelchair 150 feet activity     Assist      Assist Level: Total Assistance - Patient < 25%   Blood pressure 110/70, pulse 71, temperature 98.1 F (36.7 C), resp. rate 18, height '5\' 8"'  (1.727 m), weight 83.4 kg, SpO2 94 %.  Medical Problem List and Plan: 1.Right leg weaknesssecondary to small acute infarct within the anterior right frontal lobe. Multiple old infarcts and chronic small vessel ischemia. New RIght ACA infarct  Continue CIR  PT, OT,Team conference today please see physician documentation under team conference tab, met with team  to discuss problems,progress, and goals. Formulized individual treatment plan based on medical history, underlying problem and comorbidities.      2. Antithrombotics: -DVT/anticoagulation: Transitioned to Eliquis on 1/30 -antiplatelet therapy: Aspirin 81 mg daily 3. Pain Management:Tylenol as needed thigh pain left likely muscular pain from exercise  sportscream  2/7: voltaren gel added for left knee advise tyelnol prior to therapy    4. Mood:Aricept 10 mg daily, Lexapro 20 mg daily -antipsychotic agents: N/A 5. Neuropsych: This patientiscapable of making decisions on herown behalf. 6. Skin/Wound Care:Routine skin checks. May d/c IV 7. Fluids/Electrolytes/Nutrition:Routine in and outs 8. Antihospholipid antibody syndrome/thrombocytopenia. Follow-up hematology services.  Eliquis per neuro  9. Hypertension. Monitor with increased mobility  Cozaar 2m daily  2/7: BP is soft to 98/69. Has been low last several reads, decrease Cozaar to 12.552m   2/9: BP well controlled to soft: stop Losartan  2/16 controlled  Vitals:   01/17/21 1922 01/18/21 0422  BP: 113/68 110/70  Pulse: 78  71  Resp: 16 18  Temp: 98.5 F (36.9 C) 98.1 F (36.7 C)  SpO2: 98% 94%   10. Hypothyroidism. Synthroid 11. Hyperlipidemia. Lipitor 12. CKD stage III.   Creatinine 1.77 on 1/28, 1.67 on 2/7.  13. Diarrhea. Patient was treated for loose stools daily for the past few weeks. Plan outpatient follow-up GI. Basic stool studies including GI pathogen panel neg, C diff neg, discussed with daughter that now is not a good time to proceed with endoscopy due to recent CVA and anticoagulation  Continue Imodium as needed Reduced lipitor may be helping , also on Aricept since 11/29 this may also cause diarrhea , will hold and monitor   Improving off aricept ~2 soft BM per day  14.  Irregular heart rhythm EKG shows occasional PVC no treatment needed  2/11: HR has been well controlled- continue to monitor TID  15.  Spastic bladder due to CVA UA shows some WBC but urine cath , contaminated specimen , would reculture if dysuria occurs or fever  Continue oxybutnin , change to oxybutnin XR  Improved  15.  Acute blood loss anemia  Hemoglobin 10.2 on 1/31, 9/7 on 2/7 16.  Thrombocytopenia  Platelets 100 on 1/31, 65 on 2/7.  17. Ileus/SBO: continue reglan, CT abdomen shows resolution. Diet  has been restarted.  18. UTI: UC + for >100,000 GNR.   2/9: susceptibilities returned. Cefepime d/ced. Bactrim DS one po bid- to finish tomorrow    LOS: 20 days A FACE TO FACE EVALUATION WAS PERFORMED  Charlett Blake 01/18/2021, 7:35 AM

## 2021-01-18 NOTE — Progress Notes (Signed)
Xray to pick up pt at 11;45

## 2021-01-18 NOTE — Progress Notes (Signed)
Physical Therapy Session Note  Patient Details  Name: [name] MRN: [id number] Date of Birth: 21-Sep-1951  Today's Date: 01/18/2021 PT Individual Time: 4825-0037 PT Individual Time Calculation (min): 60 min   Short Term Goals: Week 3:  PT Short Term Goal 1 (Week 3): pt will transfer to [facility] with min assist consistently PT Short Term Goal 2 (Week 3): Pt will perform bed mobility with supervision PT Short Term Goal 3 (Week 3): Pt will ambulate 47f with Mod A using RW and +2 for safety PT Short Term Goal 4 (Week 3): Pt will propel WC 1060fwith supervision assist.  Skilled Therapeutic Interventions/Progress Updates:   Pt received supine in bed and agreeable to PT. Supine>sit transfer with min assist to facilitate improved timing and sequencing.  SB transfer to WCChums CornerWith min assist and moderate cues for proper UE placement to improve safety and motor planning. R knee blocked throughout. Pt transported to entrance of WCNavajoAttempted WC propulsion, but pt too externally distracted by temperature to functionally use RUE. Transported back to rehab gym.   SB transfer to nustep with min assist and only min cues for motor planning. Nustep reciprocal movement training x 4 min BUE/BLE and 3 min BLEonly. Min-mod assist throughout to improve ROM on the R side to allow engagement of terminal knee extension and improve symmetry.   Gait training with RW x 5f67fith mod assist and max cues for forward gaze, improved weight shift and activation of flexion symmetry of the RLE. Pt demonstrated very poor ability to attend to single instruction throughout gait training.    Pt reports need for urination. Squat pivot transfer to [facility] with mod assist. Lateral weight shift R and L to doff pants x 4 Bil. Pt able to void bowel and bladder in sitting. Sit<>stand x 2 from toilet for peri care and clothing management. Mod assist once in standing to prevent posterior/L LOB. Stand pivot transfer to [facility]  [facility]ist and max cues for gait pattern with RW.   Pt returned to room and performed SB transfer to bed with Min assist. Sit>supine completed with supervision assist and increased time for RLE management, and left supine in bed with call bell in reach and all needs met.             Therapy Documentation Precautions:  Precautions Precautions: Fall Precaution Comments: decreased L visual field Restrictions Weight Bearing Restrictions: No General:   Vital Signs: Therapy Vitals Temp: 98.2 F (36.8 C) Pulse Rate: 70 Resp: 20 BP: 111/70 Patient Position (if appropriate): Lying Oxygen Therapy SpO2: 98 % O2 Device: Room Air Pain: Pain Assessment Pain Scale: Faces Faces Pain Scale: Hurts little more Pain Type: Acute pain Pain Location: Knee Pain Orientation: Left Pain Onset: With Activity Pain Intervention(s): Rest Mobility:   Locomotion :    Trunk/Postural Assessment :    Balance:   Exercises:   Other Treatments:      Therapy/Group: Individual Therapy  AusLorie Phenix16/2022, 4:42 PM

## 2021-01-18 NOTE — Patient Care Conference (Signed)
Inpatient RehabilitationTeam Conference and Plan of Care Update Date: 01/18/2021   Time: 10:06 AM    Patient Name: [name]      Medical Record Number: [id number]  Date of Birth: [birth year] Sex: Female         Room/Bed: 4M07C/4M07C-01 Payor Info: Payor: [place] / Plan: [employer] MEDICARE / Product Type: *No Product type* /    Admit Date/Time:  12/29/2020  4:51 PM  Primary Diagnosis:  Embolism of left anterior cerebral artery  Hospital Problems: Principal Problem:   Embolism of left anterior cerebral artery Active Problems:   Ischemic cerebrovascular accident (CVA) of frontal lobe ([place])   Acute blood loss anemia   Neurogenic bladder   Fever    Expected Discharge Date: Expected Discharge Date: 01/27/21 ([facility])  Team Members Present: Physician leading conference: Dr. Alysia Penna Care Coodinator Present: Dorien Chihuahua, RN, BSN, CRRN;Christina Sampson Goon, [place] Nurse Present: Isla Pence, RN PT Present: Barrie Folk, PT OT Present: Other (comment) Providence Lanius, OT) Butts Coordinator present : Gunnar Fusi, SLP     Current Status/Progress Goal Weekly Team Focus  Bowel/Bladder   (P) Pt intermittently incontinent. LBM 01/16/2021 Frequent urination  (P) Continent of B/B  (P) Assess   Swallow/Nutrition/ Hydration             ADL's   Set-up: grooming; S: UB bathing; Mod for seated UB dressing/LB bathing; Max to dep in stedy for LB ADLs; [place] RUE: IV-V; Hand: IV; func t/fs limited by L knee pain; improved R girp strength/9HPT 35 lbs; 1 min 41 seconds  min A (may downgrade)  RUE NMR, selfcare retraining, balance retraining, activity tolerance, pt/fam edu, DME/AE edu, functional transfer retraining   Mobility             Communication             Safety/Cognition/ Behavioral Observations            Pain   Occasional head, neck and right knee pain managed with Tylenol  Pain scale of <3/10  Assess pain every shift and PRN    Skin   healing dried blister on left foot  No new breakdown  Assess every shift and PRN     Discharge Planning:  D/c to SNF LTC   Team Discussion: Patient with left knee pain related to weight bearing on that side; MD encourage patient to premedicate for therapy and use voltaren gel. MD treated UTI and adjusted ditropan dosage although patient is still incontinent at times. Slow steady progress noted.  Patient on target to meet rehab goals: no, cognitive issues; family notes baseline however inconsistent and not the same; staff request SLP re-evaluation. ACA with initiation issues effecting progress although could be dealing with multi infarct - dementia as well.  Requires mas - total assist for lower body ADLs. Left knee pain limits function, and poor motor control limits progress. Patient fluctuates min-mod assist to max assist for transfers.   *See Care Plan and progress notes for long and short-term goals.   Revisions to Treatment Plan:  Goals downgraded for OT and PT MD to order xray of knee 2/2 pain   Teaching Needs: Transfers, toileting, medications, secondary stroke risk management, etc.   Current Barriers to Discharge: Decreased caregiver support, Home enviroment access/layout and Insurance for SNF coverage  Possible Resolutions to Barriers: ST - LT SNF  [facility] services follow up  Medical Summary Current Status: UTI treated, urgency improved on TID Oxybutnin, trial of  Barriers to Discharge: Medical stability   Possible Resolutions to Barriers/Weekly Focus: Knee Xray left, ?cortisone injection   Continued Need for Acute Rehabilitation Level of Care: The patient requires daily medical management by a physician with specialized training in physical medicine and rehabilitation for the following reasons: Direction of a multidisciplinary physical rehabilitation program to maximize functional independence : Yes Medical management of patient stability for  increased activity during participation in an intensive rehabilitation regime.: Yes Analysis of laboratory values and/or radiology reports with any subsequent need for medication adjustment and/or medical intervention. : Yes   I attest that I was present, lead the team conference, and concur with the assessment and plan of the team.   Dorien Chihuahua B 01/18/2021, 2:46 PM

## 2021-01-18 NOTE — Progress Notes (Signed)
Occupational Therapy Session Note  Patient Details  Name: [name] MRN: [id number] Date of Birth: 07-13-1951  Today's Date: 01/18/2021 OT Individual Time: 4497-5300 OT Individual Time Calculation (min): 24 min and 24 min   Short Term Goals: Week 3:  OT Short Term Goal 1 (Week 3): Pt will thread BLE into pants with min A + AE PRN. OT Short Term Goal 2 (Week 3): Pt will complete STS consistently with mod A in prep for standing ADL. OT Short Term Goal 3 (Week 3): Pt will complete LB clothing management during toileting with min A. OT Short Term Goal 4 (Week 3): Pt will complete peri care during toileting with min A.  Skilled Therapeutic Interventions/Progress Updates:    Session 1 (5110-2111): Pt received in recliner with NT and daughter Deidre Ala present, agreeable to therapy. Session focus on toileting, grooming, func tranfers, activity tolerance, and problem solving. Stedy transfer to and from toilet with + 2 for safety, pt powering up initially with mod A but c/o of L knee pain. Cont void of bladder, but total for anterior peri care. In perched position on stedy, brushed teeth with close S + min VCs to locate needed items on R side of sink. Stedy transfer to w/c. Donned B socks with min A to adjust socks + use of sock aide after mod VCs for sequencing. Donned B shoes with total A.  Attempted STS x 4 with + 2, pt able to push up from BUE after mod VCs to do so, but cont to c/o of 8/10 L knee pain upon extension. Sitting up in chair, used RUE to play checkers, however req max A to total A to correctly play and problem solve after initial instructions and stating that she knew how to play. However, no noted difficulty locating pieces on R side of board. Stedy transfer back to recliner with min A to power up. Daughter requesting SLP consult as she has also noticed increased sequencing/motor planning deficits and increased frequency of repeating questions. Pt left in recliner with safety belt  alarm engaged, daughter present, call bell in reach, and all immediate needs met.    Session 2 [id number]): Pt received in recliner, agreeable to therapy. Session focus on func transfers in prep for increased ind with ADL. Stedy transfer from recliner to bed with mod A to power up, c/o of L knee pain upon L extension, resolves with rest or in stance. Completed BSC transfer from bed via SB with overall min A for balance + max VCs for sequencing/motor planning. Pt able to remove board herself but total A to place. Req increased time and VCs to initiate.  Practiced scooting towards [place], but unable to motor plan/initiate effectively req mod A to lift and pivot hips, repeating "I can't move." Sitting EOB > supine with min A to adjust trunk/RLE in bed. Doffed B shoes with mod A to remove R shoe. Pt left semi-reclined in bed with bed alarm engaged, call bell in reach, and all immediate needs met.     Therapy Documentation Precautions:  Precautions Precautions: Fall Precaution Comments: decreased L visual field Restrictions Weight Bearing Restrictions: No Pain: Pain Assessment Pain Scale: Faces Faces Pain Scale: Hurts little more Pain Type: Acute pain Pain Location: Knee Pain Orientation: Left Pain Onset: With Activity Pain Intervention(s): Rest ADL: See Care Tool for more details.    Therapy/Group: Individual Therapy  Volanda Napoleon MS, OTR/L  01/18/2021, 2:45 PM

## 2021-01-18 NOTE — Plan of Care (Signed)
  Problem: RH Balance Goal: LTG Patient will maintain dynamic standing with ADLs (OT) Description: LTG:  Patient will maintain dynamic standing balance with assist during activities of daily living (OT)  Outcome: Not Applicable Flowsheets (Taken 01/18/2021 1458) LTG: Pt will maintain dynamic standing balance during ADLs with: (goal DC 2/2 as pt will not be safe to complete standing ADL at DC) -- Note: goal DC 2/2 as pt will not be safe to complete standing ADL at DC   Problem: RH Balance Goal: LTG: Patient will maintain dynamic sitting balance (OT) Description: LTG:  Patient will maintain dynamic sitting balance with assistance during activities of daily living (OT) Flowsheets (Taken 01/18/2021 1458) LTG: Pt will maintain dynamic sitting balance during ADLs with: (Goal downgraded 2/2 slower than anticipated progress) Contact Guard/Touching assist Note: Goal downgraded 2/2 slower than anticipated progress   Problem: Sit to Stand Goal: LTG:  Patient will perform sit to stand in prep for activites of daily living with assistance level (OT) Description: LTG:  Patient will perform sit to stand in prep for activites of daily living with assistance level (OT) Flowsheets (Taken 01/18/2021 1458) LTG: PT will perform sit to stand in prep for activites of daily living with assistance level: (Goal downgraded 2/2 slower than anticipated progress) Moderate Assistance - Patient 50 - 74% Note: Goal downgraded 2/2 slower than anticipated progress   Problem: RH Grooming Goal: LTG Patient will perform grooming w/assist,cues/equip (OT) Description: LTG: Patient will perform grooming with assist, with/without cues using equipment (OT) Flowsheets (Taken 01/18/2021 1458) LTG: Pt will perform grooming with assistance level of: (Goal downgraded 2/2 slower than anticipated progress) Supervision/Verbal cueing Note: Goal downgraded 2/2 slower than anticipated progress   Problem: RH Dressing Goal: LTG Patient will  perform upper body dressing (OT) Description: LTG Patient will perform upper body dressing with assist, with/without cues (OT). Flowsheets (Taken 01/18/2021 1458) LTG: Pt will perform upper body dressing with assistance level of: (Goal downgraded 2/2 slower than anticipated progress) Supervision/Verbal cueing Note: Goal downgraded 2/2 slower than anticipated progress Goal: LTG Patient will perform lower body dressing w/assist (OT) Description: LTG: Patient will perform lower body dressing with assist, with/without cues in positioning using equipment (OT) Flowsheets (Taken 01/18/2021 1458) LTG: Pt will perform lower body dressing with assistance level of: (Goal downgraded 2/2 slower than anticipated progress) Moderate Assistance - Patient 50 - 74% Note: Goal downgraded 2/2 slower than anticipated progress   Problem: RH Toileting Goal: LTG Patient will perform toileting task (3/3 steps) with assistance level (OT) Description: LTG: Patient will perform toileting task (3/3 steps) with assistance level (OT)  Flowsheets (Taken 01/18/2021 1458) LTG: Pt will perform toileting task (3/3 steps) with assistance level: (Goal downgraded 2/2 slower than anticipated progress) Moderate Assistance - Patient 50 - 74% Note: Goal downgraded 2/2 slower than anticipated progress   Problem: RH Tub/Shower Transfers Goal: LTG Patient will perform tub/shower transfers w/assist (OT) Description: LTG: Patient will perform tub/shower transfers with assist, with/without cues using equipment (OT) Flowsheets (Taken 01/18/2021 1458) LTG: Pt will perform tub/shower stall transfers with assistance level of: (Goal downgraded 2/2 slower than anticipated progress) Moderate Assistance - Patient 50 - 74% Note: Goal downgraded 2/2 slower than anticipated progress

## 2021-01-18 NOTE — Progress Notes (Signed)
Patient ID: [name] [facility], female   DOB: 27-Jan-1951, 70 y.o.   MRN: [id number] Team Conference Report to Patient/Family  Team Conference discussion was reviewed with the patient and caregiver, including goals, any changes in plan of care and target discharge date.  Patient and caregiver express understanding and are in agreement.  The patient has a target discharge date of 01/27/21 ([facility]).  [name] 01/18/2021, 2:00 PM

## 2021-01-18 NOTE — Plan of Care (Signed)
  Problem: RH Stairs Goal: LTG Patient will ambulate up and down stairs w/assist (PT) Description: LTG: Patient will ambulate up and down # of stairs with assistance (PT) Outcome: Not Applicable Flowsheets (Taken 01/18/2021 0802) LTG: Pt will ambulate up/down stairs assist needed:: (goal d/c) -- Note: D/c goal due to slow progress and change in discharge location of SNF/LTC   Problem: Sit to Stand Goal: LTG:  Patient will perform sit to stand with assistance level (PT) Description: LTG:  Patient will perform sit to stand with assistance level (PT) Flowsheets (Taken 01/18/2021 0802) LTG: PT will perform sit to stand in preparation for functional mobility with assistance level: Moderate Assistance - Patient 50 - 74% Note: Down graded due to slow progress   Problem: RH Bed Mobility Goal: LTG Patient will perform bed mobility with assist (PT) Description: LTG: Patient will perform bed mobility with assistance, with/without cues (PT). Flowsheets (Taken 01/18/2021 0802) LTG: Pt will perform bed mobility with assistance level of: Minimal Assistance - Patient > 75% Note: Down graded due to slow progress   Problem: RH Bed to Chair Transfers Goal: LTG Patient will perform bed/chair transfers w/assist (PT) Description: LTG: Patient will perform bed to chair transfers with assistance (PT). Flowsheets (Taken 01/18/2021 0802) LTG: Pt will perform Bed to Chair Transfers with assistance level: Minimal Assistance - Patient > 75% Note: Down graded due to slow progress   Problem: RH Car Transfers Goal: LTG Patient will perform car transfers with assist (PT) Description: LTG: Patient will perform car transfers with assistance (PT). Flowsheets (Taken 01/18/2021 0802) LTG: Pt will perform car transfers with assist:: Moderate Assistance - Patient 50 - 74%   Problem: RH Ambulation Goal: LTG Patient will ambulate in controlled environment (PT) Description: LTG: Patient will ambulate in a controlled  environment, # of feet with assistance (PT). Flowsheets (Taken 01/18/2021 0802) LTG: Pt will ambulate in controlled environ  assist needed:: Moderate Assistance - Patient 50 - 74% LTG: Ambulation distance in controlled environment: 72ft with LRAD Note: Down graded due to slow progress Goal: LTG Patient will ambulate in home environment (PT) Description: LTG: Patient will ambulate in home environment, # of feet with assistance (PT). Flowsheets (Taken 01/18/2021 0802) LTG: Pt will ambulate in home environ  assist needed:: Moderate Assistance - Patient 50 - 74% LTG: Ambulation distance in home environment: 17ft with LRAD Note: Down graded due to slow progress   Problem: RH Wheelchair Mobility Goal: LTG Patient will propel w/c in controlled environment (PT) Description: LTG: Patient will propel wheelchair in controlled environment, # of feet with assist (PT) Flowsheets (Taken 01/18/2021 0802) LTG: Propel w/c distance in controlled environment: 175ft

## 2021-01-18 NOTE — Progress Notes (Signed)
Patient ID: [name] [facility], female   DOB: 08/19/1951, 70 y.o.   MRN: [id number]   Patient authorization request started per daughters request. Clinicals faxed at [phone]. Reference #[id number]. Daughter aware no guarantee on authorization due to CIR admit.   [place], [place]

## 2021-01-19 DIAGNOSIS — I6612 Occlusion and stenosis of left anterior cerebral artery: Secondary | ICD-10-CM | POA: Diagnosis not present

## 2021-01-19 LAB — SARS CORONAVIRUS 2 (TAT 6-24 HRS): SARS Coronavirus 2: NEGATIVE

## 2021-01-19 MED ORDER — TRAZODONE HCL 100 MG PO TABS: 100.0000 mg | ORAL_TABLET | Freq: Every day | ORAL | Status: AC

## 2021-01-19 MED ORDER — OXYBUTYNIN CHLORIDE ER 15 MG PO TB24: 15.0000 mg | ORAL_TABLET | Freq: Every day | ORAL | Status: AC

## 2021-01-19 MED ORDER — ATORVASTATIN CALCIUM 20 MG PO TABS: 20.0000 mg | ORAL_TABLET | Freq: Every day | ORAL | Status: AC

## 2021-01-19 MED ORDER — BACITRACIN-NEOMYCIN-POLYMYXIN OINTMENT TUBE: 1.0000 "application " | TOPICAL_OINTMENT | Freq: Two times a day (BID) | CUTANEOUS | Status: AC

## 2021-01-19 MED ORDER — ACETAMINOPHEN 325 MG PO TABS: 650.0000 mg | ORAL_TABLET | ORAL | Status: DC | PRN

## 2021-01-19 MED ORDER — APIXABAN 5 MG PO TABS: 5.0000 mg | ORAL_TABLET | Freq: Two times a day (BID) | ORAL | Status: DC

## 2021-01-19 MED ORDER — DICLOFENAC SODIUM 1 % EX GEL: 2.0000 g | Freq: Four times a day (QID) | CUTANEOUS | Status: AC | PRN

## 2021-01-19 NOTE — Discharge Summary (Signed)
Physician Discharge Summary  Patient ID: [name] MRN: [id number] DOB/AGE: 1951-01-29 70 y.o.  Admit date: 12/29/2020 Discharge date: 01/20/2021  Discharge Diagnoses:  Principal Problem:   Embolism of left anterior cerebral artery Active Problems:   Ischemic cerebrovascular accident (CVA) of frontal lobe (HCC)   Acute blood loss anemia   Neurogenic bladder   Fever DVT prophylaxis Hypertension Hypothyroidism Hyperlipidemia CKD stage III Diarrhea Acute blood loss anemia Thrombocytopenia Ileus UTI Antiphospholipid antibody syndrome  Discharged Condition: Stable  Significant Diagnostic Studies: CT ABDOMEN PELVIS WO CONTRAST  Result Date: 01/09/2021 CLINICAL DATA:  Dilated loops of bowel on recent plain film EXAM: CT ABDOMEN AND PELVIS WITHOUT CONTRAST TECHNIQUE: Multidetector CT imaging of the abdomen and pelvis was performed following the standard protocol without IV contrast. COMPARISON:  01/08/2021 FINDINGS: Lower chest: Mild basilar atelectasis is noted. Hepatobiliary: No focal liver abnormality is seen. Status post cholecystectomy. No biliary dilatation. Pancreas: Unremarkable. No pancreatic ductal dilatation or surrounding inflammatory changes. Spleen: Normal in size without focal abnormality. Adrenals/Urinary Tract: Adrenal glands are within normal limits. Kidneys demonstrate a normal appearance bilaterally. No renal calculi or obstructive changes are seen. The ureters are within normal limits. The bladder is decompressed. Stomach/Bowel: Colon is predominately decompressed. Small bowel shows no significant obstructive change. Stomach is within normal limits. Vascular/Lymphatic: Aortic atherosclerosis. No enlarged abdominal or pelvic lymph nodes. Reproductive: Uterus and bilateral adnexa are unremarkable. Other: No abdominal wall hernia or abnormality. No abdominopelvic ascites. Musculoskeletal: No acute or significant osseous findings. IMPRESSION: No significant bowel  dilatation is noted. Previously seen changes have resolved in the interval. No new focal abnormality is noted. Electronically Signed   By: Inez Catalina M.D.   On: 01/09/2021 14:36   DG Chest 2 View  Result Date: 01/08/2021 CLINICAL DATA:  Fever EXAM: CHEST - 2 VIEW COMPARISON:  03/17/2019 FINDINGS: Frontal and lateral views of the chest demonstrate an unremarkable cardiac silhouette. No airspace disease, effusion, or pneumothorax. No acute bony abnormality. IMPRESSION: 1. No acute intrathoracic process. Electronically Signed   By: Randa Ngo M.D.   On: 01/08/2021 20:39   DG Knee 1-2 Views Left  Result Date: 01/18/2021 CLINICAL DATA:  Left knee pain without recent injury. EXAM: LEFT KNEE - 1-2 VIEW COMPARISON:  None. FINDINGS: No evidence of fracture, dislocation, or joint effusion. Mild osteophyte formation is seen involving the lateral joint space. Soft tissues are unremarkable. IMPRESSION: Mild degenerative joint disease. No acute abnormality seen in the left knee. Electronically Signed   By: Marijo Conception M.D.   On: 01/18/2021 14:18   DG Abd 1 View  Result Date: 01/08/2021 CLINICAL DATA:  Vomiting. EXAM: ABDOMEN - 1 VIEW COMPARISON:  None. FINDINGS: There are mildly dilated loops of small bowel in the abdomen measuring up to approximately 3 cm. There is a large amount of stool in the ascending colon. There is no concerning radiopaque kidney stone. There are degenerative changes of the lumbar spine and bilateral hips. IMPRESSION: Mildly dilated loops of small bowel in the abdomen. Findings may represent an ileus or developing partial small bowel obstruction. Electronically Signed   By: Constance Holster M.D.   On: 01/08/2021 22:09   MR ANGIO HEAD WO CONTRAST  Result Date: 12/26/2020 CLINICAL DATA:  Acute neurologic deficits. EXAM: MRI HEAD WITHOUT CONTRAST MRA HEAD WITHOUT CONTRAST MRA NECK WITHOUT CONTRAST TECHNIQUE: Multiplanar, multiecho pulse sequences of the brain and surrounding  structures were obtained without intravenous contrast. Angiographic images of the Circle of Willis were obtained using MRA  technique without intravenous contrast. Angiographic images of the neck were obtained using MRA technique without intravenous contrast. Carotid stenosis measurements (when applicable) are obtained utilizing NASCET criteria, using the distal internal carotid diameter as the denominator. COMPARISON:  None. FINDINGS: MRI HEAD FINDINGS Brain: Small acute infarct within the anterior right frontal lobe. There are old infarcts of both cerebellar hemispheres, the left frontal lobe, the right parietal lobe and the right occipital lobe. No acute or chronic hemorrhage. There is multifocal hyperintense T2-weighted signal within the white matter. Generalized volume loss without a clear lobar predilection. The midline structures are normal. Vascular: Major flow voids are preserved. Skull and upper cervical spine: Normal calvarium and skull base. Visualized upper cervical spine and soft tissues are normal. Sinuses/Orbits:No paranasal sinus fluid levels or advanced mucosal thickening. No mastoid or middle ear effusion. Normal orbits. MRA HEAD FINDINGS POSTERIOR CIRCULATION: --Vertebral arteries: Normal --Inferior cerebellar arteries: Normal. --Basilar artery: Normal. --Superior cerebellar arteries: Normal. --Posterior cerebral arteries: Distal occlusion of the right PCA is likely chronic given old PCA territory infarct. Moderate stenosis of the left P4 segment. ANTERIOR CIRCULATION: --Intracranial internal carotid arteries: Normal. --Anterior cerebral arteries (ACA): Normal. --Middle cerebral arteries (MCA): Normal. ANATOMIC VARIANTS: None MRA NECK FINDINGS Codominant vertebral arteries are normal along the entirety of the visualized portion. Time-of-flight imaging poorly images of the V1 and V3 segments. No carotid stenosis. IMPRESSION: 1. Small acute infarct within the anterior right frontal lobe. No  hemorrhage or mass effect. 2. Multiple old infarcts and chronic small vessel ischemia. 3. Distal occlusion of the right PCA is likely chronic given old PCA territory infarct. 4. Moderate stenosis of the left P4 segment. 5. Time-of-flight MRA of the neck without hemodynamically significant stenosis. Electronically Signed   By: Ulyses Jarred M.D.   On: 12/26/2020 23:25   MR ANGIO NECK WO CONTRAST  Result Date: 12/26/2020 CLINICAL DATA:  Acute neurologic deficits. EXAM: MRI HEAD WITHOUT CONTRAST MRA HEAD WITHOUT CONTRAST MRA NECK WITHOUT CONTRAST TECHNIQUE: Multiplanar, multiecho pulse sequences of the brain and surrounding structures were obtained without intravenous contrast. Angiographic images of the Circle of Willis were obtained using MRA technique without intravenous contrast. Angiographic images of the neck were obtained using MRA technique without intravenous contrast. Carotid stenosis measurements (when applicable) are obtained utilizing NASCET criteria, using the distal internal carotid diameter as the denominator. COMPARISON:  None. FINDINGS: MRI HEAD FINDINGS Brain: Small acute infarct within the anterior right frontal lobe. There are old infarcts of both cerebellar hemispheres, the left frontal lobe, the right parietal lobe and the right occipital lobe. No acute or chronic hemorrhage. There is multifocal hyperintense T2-weighted signal within the white matter. Generalized volume loss without a clear lobar predilection. The midline structures are normal. Vascular: Major flow voids are preserved. Skull and upper cervical spine: Normal calvarium and skull base. Visualized upper cervical spine and soft tissues are normal. Sinuses/Orbits:No paranasal sinus fluid levels or advanced mucosal thickening. No mastoid or middle ear effusion. Normal orbits. MRA HEAD FINDINGS POSTERIOR CIRCULATION: --Vertebral arteries: Normal --Inferior cerebellar arteries: Normal. --Basilar artery: Normal. --Superior cerebellar  arteries: Normal. --Posterior cerebral arteries: Distal occlusion of the right PCA is likely chronic given old PCA territory infarct. Moderate stenosis of the left P4 segment. ANTERIOR CIRCULATION: --Intracranial internal carotid arteries: Normal. --Anterior cerebral arteries (ACA): Normal. --Middle cerebral arteries (MCA): Normal. ANATOMIC VARIANTS: None MRA NECK FINDINGS Codominant vertebral arteries are normal along the entirety of the visualized portion. Time-of-flight imaging poorly images of the V1 and V3 segments. No carotid  stenosis. IMPRESSION: 1. Small acute infarct within the anterior right frontal lobe. No hemorrhage or mass effect. 2. Multiple old infarcts and chronic small vessel ischemia. 3. Distal occlusion of the right PCA is likely chronic given old PCA territory infarct. 4. Moderate stenosis of the left P4 segment. 5. Time-of-flight MRA of the neck without hemodynamically significant stenosis. Electronically Signed   By: Ulyses Jarred M.D.   On: 12/26/2020 23:25   MR BRAIN WO CONTRAST  Result Date: 12/31/2020 CLINICAL DATA:  Stroke. Anti phospholipid antibody syndrome. On Coumadin. EXAM: MRI HEAD WITHOUT CONTRAST TECHNIQUE: Multiplanar, multiecho pulse sequences of the brain and surrounding structures were obtained without intravenous contrast. COMPARISON:  MRI head 12/26/2020 FINDINGS: Brain: Interval development of acute infarct in the left medial posterior frontal lobe in the ACA territory. Small areas of cortical infarct in the left frontal lobe over the convexity. Small area of acute infarct in the right anterior frontal cortex over the convexity unchanged from the prior study. Generalized atrophy. Chronic infarct right occipital parietal lobe unchanged. Small infarct left parietal lobe unchanged. Chronic infarct left frontal lobe. Mild white matter changes appear chronic. Negative for hemorrhage or mass. Vascular: Normal arterial flow voids Skull and upper cervical spine: Negative  Sinuses/Orbits: Mucosal edema paranasal sinuses.  Negative orbit Other: None IMPRESSION: Interval development of acute infarct in the left medial frontal parietal lobe in the ACA territory. New small areas of acute infarct in the left frontal convexity. Recent infarct in the right anterior frontal lobe unchanged from 12/26/2020. Chronic ischemic changes bilaterally. Electronically Signed   By: Franchot Gallo M.D.   On: 12/31/2020 10:40   MR BRAIN WO CONTRAST  Result Date: 12/26/2020 CLINICAL DATA:  Acute neurologic deficits. EXAM: MRI HEAD WITHOUT CONTRAST MRA HEAD WITHOUT CONTRAST MRA NECK WITHOUT CONTRAST TECHNIQUE: Multiplanar, multiecho pulse sequences of the brain and surrounding structures were obtained without intravenous contrast. Angiographic images of the Circle of Willis were obtained using MRA technique without intravenous contrast. Angiographic images of the neck were obtained using MRA technique without intravenous contrast. Carotid stenosis measurements (when applicable) are obtained utilizing NASCET criteria, using the distal internal carotid diameter as the denominator. COMPARISON:  None. FINDINGS: MRI HEAD FINDINGS Brain: Small acute infarct within the anterior right frontal lobe. There are old infarcts of both cerebellar hemispheres, the left frontal lobe, the right parietal lobe and the right occipital lobe. No acute or chronic hemorrhage. There is multifocal hyperintense T2-weighted signal within the white matter. Generalized volume loss without a clear lobar predilection. The midline structures are normal. Vascular: Major flow voids are preserved. Skull and upper cervical spine: Normal calvarium and skull base. Visualized upper cervical spine and soft tissues are normal. Sinuses/Orbits:No paranasal sinus fluid levels or advanced mucosal thickening. No mastoid or middle ear effusion. Normal orbits. MRA HEAD FINDINGS POSTERIOR CIRCULATION: --Vertebral arteries: Normal --Inferior cerebellar  arteries: Normal. --Basilar artery: Normal. --Superior cerebellar arteries: Normal. --Posterior cerebral arteries: Distal occlusion of the right PCA is likely chronic given old PCA territory infarct. Moderate stenosis of the left P4 segment. ANTERIOR CIRCULATION: --Intracranial internal carotid arteries: Normal. --Anterior cerebral arteries (ACA): Normal. --Middle cerebral arteries (MCA): Normal. ANATOMIC VARIANTS: None MRA NECK FINDINGS Codominant vertebral arteries are normal along the entirety of the visualized portion. Time-of-flight imaging poorly images of the V1 and V3 segments. No carotid stenosis. IMPRESSION: 1. Small acute infarct within the anterior right frontal lobe. No hemorrhage or mass effect. 2. Multiple old infarcts and chronic small vessel ischemia. 3. Distal occlusion of  the right PCA is likely chronic given old PCA territory infarct. 4. Moderate stenosis of the left P4 segment. 5. Time-of-flight MRA of the neck without hemodynamically significant stenosis. Electronically Signed   By: Ulyses Jarred M.D.   On: 12/26/2020 23:25   MR CERVICAL SPINE WO CONTRAST  Result Date: 12/31/2020 CLINICAL DATA:  Acute myelopathy. Anti phospholipid antibody syndrome. On Coumadin. EXAM: MRI CERVICAL SPINE WITHOUT CONTRAST TECHNIQUE: Multiplanar, multisequence MR imaging of the cervical spine was performed. No intravenous contrast was administered. COMPARISON:  Cervical spine radiograph 04/15/2005 FINDINGS: Alignment: 3 mm retrolisthesis C5-6. Vertebrae: Negative for fracture or mass Cord: Normal signal and morphology Posterior Fossa, vertebral arteries, paraspinal tissues: Negative Disc levels: C2-3: Negative C3-4: Moderate right foraminal encroachment due to facet hypertrophy. Spinal canal normal in size. C4-5: Mild disc degeneration and spurring. Mild right foraminal narrowing due to uncinate spurring and facet hypertrophy. Left foramen patent. C5-6: 3 mm retrolisthesis. Disc degeneration and facet  degeneration. Mild to moderate foraminal encroachment bilaterally. Mild spinal stenosis. C6-7: Small central disc protrusion.  Negative for stenosis C7-T1: Negative IMPRESSION: Mild right foraminal narrowing C3-4 and C4-5 due to spurring 3 mm retrolisthesis C5-6. Mild spinal stenosis and mild to moderate foraminal stenosis bilaterally due to spurring. Electronically Signed   By: Franchot Gallo M.D.   On: 12/31/2020 11:07   US Venous Img Lower Bilateral (DVT)  Result Date: 12/27/2020 CLINICAL DATA:  Bilateral leg pain EXAM: BILATERAL LOWER EXTREMITY VENOUS DOPPLER ULTRASOUND TECHNIQUE: Gray-scale sonography with graded compression, as well as color Doppler and duplex ultrasound were performed to evaluate the lower extremity deep venous systems from the level of the common femoral vein and including the common femoral, femoral, profunda femoral, popliteal and calf veins including the posterior tibial, peroneal and gastrocnemius veins when visible. The superficial great saphenous vein was also interrogated. Spectral Doppler was utilized to evaluate flow at rest and with distal augmentation maneuvers in the common femoral, femoral and popliteal veins. COMPARISON:  None. FINDINGS: RIGHT LOWER EXTREMITY Common Femoral Vein: No evidence of thrombus. Normal compressibility, respiratory phasicity and response to augmentation. Saphenofemoral Junction: No evidence of thrombus. Normal compressibility and flow on color Doppler imaging. Profunda Femoral Vein: No evidence of thrombus. Normal compressibility and flow on color Doppler imaging. Femoral Vein: No evidence of thrombus. Normal compressibility, respiratory phasicity and response to augmentation. Popliteal Vein: No evidence of thrombus. Normal compressibility, respiratory phasicity and response to augmentation. Calf Veins: No evidence of thrombus. Normal compressibility and flow on color Doppler imaging. Superficial Great Saphenous Vein: No evidence of thrombus. Normal  compressibility. Venous Reflux:  None. Other Findings:  None. LEFT LOWER EXTREMITY Common Femoral Vein: No evidence of thrombus. Normal compressibility, respiratory phasicity and response to augmentation. Saphenofemoral Junction: No evidence of thrombus. Normal compressibility and flow on color Doppler imaging. Profunda Femoral Vein: No evidence of thrombus. Normal compressibility and flow on color Doppler imaging. Femoral Vein: No evidence of thrombus. Normal compressibility, respiratory phasicity and response to augmentation. Popliteal Vein: No evidence of thrombus. Normal compressibility, respiratory phasicity and response to augmentation. Calf Veins: No evidence of thrombus. Normal compressibility and flow on color Doppler imaging. Superficial Great Saphenous Vein: No evidence of thrombus. Normal compressibility. Venous Reflux:  None. Other Findings:  None. IMPRESSION: No evidence of deep venous thrombosis in either lower extremity. Electronically Signed   By: Inez Catalina M.D.   On: 12/27/2020 11:58   ECHOCARDIOGRAM COMPLETE  Result Date: 12/27/2020    ECHOCARDIOGRAM REPORT   Patient Name:   [facility]  Doristine Mango Date of Exam: 12/27/2020 Medical Rec #:  [id number]          Height:       68.0 in Accession #:    [id number]         Weight:       186.0 lb Date of Birth:  [birth year]         BSA:          1.982 m Patient Age:    68 years           BP:           162/80 mmHg Patient Gender: F                  HR:           74 bpm. Exam Location:  [facility] Procedure: 2D Echo, Color Doppler, Cardiac Doppler and Intracardiac            Opacification Agent Indications:     I163.9 Stroke  History:         Patient has no prior history of Echocardiogram examinations.                  Risk Factors:Hypertension and Dyslipidemia.  Sonographer:     Charmayne Sheer RDCS (AE) Referring Phys:  [id number] [facility] AMIN Diagnosing Phys: Bartholome Bill MD  Sonographer Comments: Suboptimal apical window. Image acquisition challenging due to patient  body habitus. IMPRESSIONS  1. Left ventricular ejection fraction, by estimation, is 55 to 60%. The left ventricle has normal function. The left ventricle has no regional wall motion abnormalities. Left ventricular diastolic parameters are consistent with Grade I diastolic dysfunction (impaired relaxation).  2. Right ventricular systolic function is normal. The right ventricular size is normal.  3. Left atrial size was mildly dilated.  4. The mitral valve is grossly normal. Trivial mitral valve regurgitation.  5. The aortic valve was not well visualized. Aortic valve regurgitation is trivial. FINDINGS  Left Ventricle: Left ventricular ejection fraction, by estimation, is 55 to 60%. The left ventricle has normal function. The left ventricle has no regional wall motion abnormalities. Definity contrast agent was given IV to delineate the left ventricular  endocardial borders. The left ventricular internal cavity size was normal in size. There is no left ventricular hypertrophy. Left ventricular diastolic parameters are consistent with Grade I diastolic dysfunction (impaired relaxation). Right Ventricle: The right ventricular size is normal. No increase in right ventricular wall thickness. Right ventricular systolic function is normal. Left Atrium: Left atrial size was mildly dilated. Right Atrium: Right atrial size was normal in size. Pericardium: There is no evidence of pericardial effusion. Mitral Valve: The mitral valve is grossly normal. Trivial mitral valve regurgitation. MV peak gradient, 4.9 mmHg. The mean mitral valve gradient is 2.0 mmHg. Tricuspid Valve: The tricuspid valve is not well visualized. Tricuspid valve regurgitation is trivial. Aortic Valve: The aortic valve was not well visualized. Aortic valve regurgitation is trivial. Aortic valve mean gradient measures 2.0 mmHg. Aortic valve peak gradient measures 4.0 mmHg. Aortic valve area, by VTI measures 2.54 cm. Pulmonic Valve: The pulmonic valve was not  well visualized. Pulmonic valve regurgitation is trivial. Aorta: The aortic root is normal in size and structure. IAS/Shunts: No atrial level shunt detected by color flow Doppler.  LEFT VENTRICLE PLAX 2D LVIDd:         4.60 cm  Diastology LVIDs:         3.20 cm  LV e' medial:  7.18 cm/s LV PW:         1.10 cm  LV E/e' medial:  10.8 LV IVS:        0.70 cm  LV e' lateral:   8.05 cm/s LVOT diam:     2.00 cm  LV E/e' lateral: 9.6 LV SV:         42 LV SV Index:   21 LVOT Area:     3.14 cm  RIGHT VENTRICLE RV Basal diam:  3.60 cm LEFT ATRIUM         Index      RIGHT ATRIUM           Index LA diam:    4.30 cm 2.17 cm/m RA Area:     16.00 cm                                RA Volume:   37.90 ml  19.13 ml/m  AORTIC VALVE                   PULMONIC VALVE AV Area (Vmax):    2.13 cm    PV Vmax:       0.95 m/s AV Area (Vmean):   2.38 cm    PV Vmean:      66.500 cm/s AV Area (VTI):     2.54 cm    PV VTI:        0.217 m AV Vmax:           100.00 cm/s PV Peak grad:  3.6 mmHg AV Vmean:          59.900 cm/s PV Mean grad:  2.0 mmHg AV VTI:            0.166 m AV Peak Grad:      4.0 mmHg AV Mean Grad:      2.0 mmHg LVOT Vmax:         67.90 cm/s LVOT Vmean:        45.400 cm/s LVOT VTI:          0.134 m LVOT/AV VTI ratio: 0.81  AORTA Ao Root diam: 2.90 cm MITRAL VALVE MV Area (PHT): 5.58 cm    SHUNTS MV Area VTI:   1.99 cm    Systemic VTI:  0.13 m MV Peak grad:  4.9 mmHg    Systemic Diam: 2.00 cm MV Mean grad:  2.0 mmHg MV Vmax:       1.11 m/s MV Vmean:      67.3 cm/s MV Decel Time: 136 msec MV E velocity: 77.60 cm/s MV A velocity: 96.40 cm/s MV E/A ratio:  0.80 Bartholome Bill MD Electronically signed by Bartholome Bill MD Signature Date/Time: 12/27/2020/4:38:53 PM    Final    CT HEAD CODE STROKE WO CONTRAST  Result Date: 12/26/2020 CLINICAL DATA:  Code stroke.  Right arm and leg weakness EXAM: CT HEAD WITHOUT CONTRAST TECHNIQUE: Contiguous axial images were obtained from the base of the skull through the vertex without  intravenous contrast. COMPARISON:  04/23/2012 FINDINGS: Brain: No acute finding by CT. Few old small vessel cerebellar infarctions. Old infarction in the right occipital lobe, at the right parietal vertex, and at the left frontal convexity. Chronic small-vessel ischemic changes of the hemispheric white matter. No mass, hemorrhage, hydrocephalus or extra-axial collection. Vascular: There is atherosclerotic calcification of the major vessels at the base of the brain. Skull: Negative Sinuses/Orbits: Clear Other: None ASPECTS (  [place] Stroke Program Early CT Score) - Ganglionic level infarction (caudate, lentiform nuclei, internal capsule, insula, M1-M3 cortex): 7, allowing for the old infarction. - Supraganglionic infarction (M4-M6 cortex): 3, allowing for the old infarction. Total score (0-10 with 10 being normal): 10, allowing for the old infarction. IMPRESSION: 1. No acute finding by CT. 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 Chronic small-vessel ischemic changes of the white matter. 2. ASPECTS is 10, allowing for the old infarction. 3. These results were called by telephone at the time of interpretation on 12/26/2020 at 8:07 pm to provider Dr. Nickolas Madrid, Who verbally acknowledged these results. Electronically Signed   By: Nelson Chimes M.D.   On: 12/26/2020 20:09    Labs:  Basic Metabolic Panel: No results for input(s): NA, K, CL, CO2, GLUCOSE, BUN, CREATININE, CALCIUM, MG, PHOS in the last 168 hours.  CBC: No results for input(s): WBC, NEUTROABS, HGB, HCT, MCV, PLT in the last 168 hours.  CBG: No results for input(s): GLUCAP in the last 168 hours.  Family history.  Father with CVA.  Maternal grandmother with breast cancer mother with kidney cancer.  Denies any colon cancer esophageal cancer or rectal cancer  Brief HPI:   [name] is a 70 y.o. right-handed female with history of CVA 2004 with residual left lateral visual field deficits, history of DVT and antiphospholipid antibody syndrome  diagnosed 2004 followed by Dr. Otilio Miu maintained on chronic Coumadin, CKD stage III, hypertension hyperlipidemia hypothyroidism and mild cognitive impairments maintained on Aricept.  Per chart review lives with spouse.  Husband is limited physically.  They do have a daughter in [place].  Patient reportedly independent prior to admission.  Presented 12/26/2020 with acute onset of right leg weakness.  Cranial CT scan showed no acute findings.  Multiple old infarcts in the right occipital lobe at the right parietal vertex and at the left frontal convexity as well as chronic small vessel ischemic changes in the white matter.  Patient did not receive TPA.  MRI showed small acute infarct within the anterior right frontal lobe.  No hemorrhage or mass-effect.  Distal occlusion of the right PCA likely chronic given old PCA territory infarction.  Moderate stenosis of the left P4 segment.  Admission chemistries unremarkable except BUN 30 creatinine 1.68 glucose 102 urine drug screen negative hemoglobin 11.1 platelets 86,000 INR 1.2.  Echocardiogram with ejection fraction of 55 to 60% no wall motion abnormalities.  Grade 1 diastolic dysfunction.  Neurology follow-up continued on chronic Coumadin with INR goal 2.5-3.5 as well as low-dose aspirin.  Follow-up oncology services for thrombocytopenia maintained on Coumadin therapy.  Peripheral smear revealed no evidence of schistocytes and recommendations of routine follow-up CBC.  Bilateral lower extremity Dopplers negative.  Patient with multiple bouts of loose stools no abdominal pain or nausea.  Orders initially for follow-up outpatient GI services as needed.  Therapy evaluations completed due to patient's right side weakness decreased functional mobility was admitted for a comprehensive rehab program.   Hospital Course: R[facility] H[facility] was admitted to rehab 12/29/2020 for inpatient therapies to consist of PT, ST and OT at least three hours five days a week. Past  admission physiatrist, therapy team and rehab RN have worked together to provide customized collaborative inpatient rehab.  Pertaining to patient's small acute infarct within the anterior right frontal lobe as well as multiple old infarcts and chronic small vessel ischemia.  Findings of new right ACA infarction.  Patient was transitioned to Eliquis therapy 01/01/2021 aspirin therapy as  advised recommendations follow-up neurology services.  There were no bleeding episodes.  She remained on Aricept and Lexapro for mood stabilization history of cognitive deficits.  Antiphospholipid antibody syndrome thrombocytopenia follow-up hematology services as needed continue Eliquis.  Blood pressure controlled on low-dose Cozaar that was later discontinued as blood pressures remained soft.  Hypothyroidism with Synthroid ongoing.  CKD stage III creatinine 1.77 and monitored.  Noted history of diarrhea loose stools plan outpatient GI if needed C. difficile testing was negative.  It was discussed with family she was not a good candidate to proceed with endoscopy due to recent CVA and anticoagulation.  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  She did complete a course of cefepime for UTI that was changed to Bactrim and course completed.  No dysuria or hematuria noted.  Hospital course complicated by ileus her diet was downgraded advanced as tolerated.   Blood pressures were monitored on TID basis and soft and monitored     Rehab course: During patient's stay in rehab weekly team conferences were held to monitor patient's progress, set goals and discuss barriers to discharge. At admission, patient required moderate assist supine to sit max assist sit to supine.  ADLs need assistance and mod assist.  Physical exam.  Blood pressure 142/87 pulse 79 temperature 95 respirations 16 oxygen saturation 9 9% room air Constitutional.  Awake alert  no acute distress speech was fluent oriented to person and place HEENT Head.  Normocephalic and atraumatic Eyes.  Pupils round and reactive to light no discharge.nystagmus Neck.  Supple nontender no JVD without thyromegaly Cardiac regular rate and rhythm no murmur rubs murmurs or extra sounds Respiratory effort normal no respiratory distress without wheeze Abdomen.  Soft nontender positive bowel sounds Extremities.  No clubbing cyanosis or edema Neurologic.  Cranial nerves II through XII intact motor strength 5 out of 5 in bilateral deltoids biceps triceps grip, left hip flexors, knee extensors ankle dorsi plantarflexion 0/5 right lower extremity except to minus right hip abduction.  He/She  has had improvement in activity tolerance, balance, postural control as well as ability to compensate for deficits. He/She has had improvement in functional use RUE/LUE  and RLE/LLE as well as improvement in awareness.  Supine to sit transfers minimal assist to facilitate improving timing and sequencing.  Sliding board transfer to wheelchair with minimal assist with moderate cues for proper upper extremity placement.  Gait training rolling walker 8 feet moderate assist max cues for forward gaze.  Squat pivot transfer to bedside commode over toilet with moderate assist.  ADLs focused on toileting grooming functional transfers activity tolerance.  Steady transfer to and from toilet with +2 for safety.  Steady transfer to wheelchair donning bilateral socks with minimal assist just socks.  Steady transfer from recliner to bed with moderate assist with rest breaks due to endurance.  Full teaching was completed plan was for discharge to skilled nursing facility.       Disposition: Discharged to skilled nursing facility    Diet: Regular  Special Instructions: No driving smoking or alcohol  Follow-up outpatient gastroenterology services for history of diarrhea  Neosporin 2 times daily apply to side of left  foot cover with small foam dressing  Medications at discharge 1.  Tylenol as needed 2.  Eliquis 5 mg p.o. twice daily 3.  Lipitor 20 mg p.o. daily 4.  Voltaren gel 2 g 4 times daily as needed to affected area 5.  Lexapro 20 mg p.o. daily 6.  Pepcid 20 mg p.o. daily 7.  Ferrous sulfate 325 mg p.o. nightly 8.  Synthroid 100 mcg p.o. daily 9.  Imodium as needed 10.  Singulair 10 mg p.o. nightly 11.  Ditropan XL 15 mg p.o. nightly 12.  Trazodone 100 mg p.o. nightly 13.  Vitamin B12 1000 mcg p.o. daily 14.  Aricept 10 mg daily  30-35 minutes were spent completing discharge summary and discharge planning  Discharge Instructions    Ambulatory referral to Neurology   Complete by: As directed    An appointment is requested in approximately 4 weeks anterior right frontal lobe infarction   Ambulatory referral to Physical Medicine Rehab   Complete by: As directed    Follow-up 1 month patient for skilled nursing facility placement.  Anterior right frontal lobe infarction       Follow-up Information    Kirsteins, Luanna Salk, MD Follow up.   Specialty: Physical Medicine and Rehabilitation Why: Office to call for appointment Contact information: [place] [address] [phone]        Juline Patch, MD Follow up.   Specialty: Family Medicine Why: Call for appointment Contact information: [address] [place] [address] [phone]               Signed: Cathlyn Parsons 01/20/2021, 5:12 AM

## 2021-01-19 NOTE — Progress Notes (Signed)
Physical Therapy Discharge Summary  Patient Details  Name: [name] MRN: [id number] Date of Birth: [birth year]  Today's Date: 01/19/2021 PT Individual Time: 1300-1400 PT Individual Time Calculation (min): 60 min    Patient has met 7 of 9 long term goals due to improved activity tolerance, improved balance, improved postural control, increased strength, increased range of motion, decreased pain, ability to compensate for deficits, functional use of  right upper extremity and right lower extremity, improved attention and improved awareness.  Patient to discharge at a wheelchair level [place].   Patient's care partner unavailable to provide the necessary physical and cognitive assistance at discharge.  Reasons goals not met: Pt continues to demonstrate poor motor planning, problem solving, midline orientation   Recommendation:  Patient will benefit from ongoing skilled PT services in skilled nursing facility setting to continue to advance safe functional mobility, address ongoing impairments in balance, transfers, motor planning, awareness, attention, coordination, and minimize fall risk.  Equipment: No equipment provided  Reasons for discharge: discharge from hospital  Patient/family agrees with progress made and goals achieved: Yes   PT treatment: Pt received sitting in recliner and agreeable to PT. SB transfer to [facility] with min assist and mod-max cues for sequencing. Orthotist present to fit pt for bledsoe brace. WC mobility instructed by PT x 55f with min assist and then 1031fwith supervision assist and max cues from PT. Gait training with Mod assist and max cues as listed below. PT instructed pt in Grad day assessment to measure progress toward goals. See below for details. Patient returned to room and performed SB to recliner with min assist. Pt left sitting in recliner with call bell in reach and all needs met.      PT  Discharge Precautions/Restrictions Precautions Precautions: Fall Precaution Comments: decreased L visual field Restrictions Weight Bearing Restrictions: No Pain    Denies at rest  Vision/Perception  Perception Perception: Impaired Inattention/Neglect: Does not attend to right visual field;Does not attend to right side of body Praxis Praxis: Impaired Praxis Impairment Details: Initiation;Ideomotor;Motor planning Praxis-Other Comments: Cont to req min VCs for intitiation of func trasnfers, mod to max for motor planning during func transfers and problem solving complex ADL  Cognition Overall Cognitive Status: Impaired/Different from baseline Arousal/Alertness: Awake/alert Orientation Level: Oriented X4 Attention: Sustained Sustained Attention: Impaired Sustained Attention Impairment: Verbal complex;Functional complex Memory: Impaired Memory Impairment: Decreased recall of new information Problem Solving: Impaired Problem Solving Impairment: Verbal complex;Functional complex Safety/Judgment: Appears intact Comments: Req minimal reminders of next task to complete during session despite having heard explanation ~2 min prior Sensation Sensation Light Touch: Appears Intact Hot/Cold: Appears Intact Proprioception: Impaired by gross assessment Stereognosis: Appears Intact Coordination Gross Motor Movements are Fluid and Coordinated: No Fine Motor Movements are Fluid and Coordinated: Yes Coordination and Movement Description: R hemiparesis RLE>RUE, impaired BLE coordination during func mobility 2/2 L knee pain; improved BUE coordination from eval 9 Hole Peg Test: R hand: 1 min 41 seconds Motor  Motor Motor: Hemiplegia;Abnormal postural alignment and control Motor - Discharge Observations: R hemiplegia RLE>RUE  Mobility Bed Mobility Bed Mobility: Rolling Right;Rolling Left;Right Sidelying to Sit;Supine to Sit;Scooting to [facility] to Supine Rolling Right: Supervision/verbal  cueing Rolling Left: Minimal Assistance - Patient > 75% Right Sidelying to Sit: Moderate Assistance - Patient 50-74%;Minimal Assistance - Patient > 75% Sit to Supine: Minimal Assistance - Patient > 75% Scooting to HOB: Moderate Assistance - Patient 50-74% Transfers Transfers: Sit to Stand;Stand to Sit;Squat Pivot Transfers;Lateral/Scoot Transfers Sit to Stand:  Dependent - mechanical lift Stand to Sit: Dependent - mechanical lift Squat Pivot Transfers: Moderate Assistance - Patient 50-74% Lateral/Scoot Transfers: Moderate Assistance - Patient 50-74% Transfer via Lift Equipment: Probation officer Ambulation: Yes Gait Assistance: Moderate Assistance - Patient 50-74% Gait Distance (Feet): 30 Feet Assistive device: Other (Comment);Rolling walker (rail in hall) Gait Assistance Details: Verbal cues for safe use of DME/AE;Verbal cues for gait pattern;Manual facilitation for weight shifting;Manual facilitation for placement;Manual facilitation for weight bearing;Verbal cues for precautions/safety;Verbal cues for technique;Verbal cues for sequencing Gait Assistance Details: max cues for midline, attention to task and sequecning throughout Gait Gait: Yes Gait Pattern: Impaired Gait Pattern: Right steppage;Right foot flat;Right hip hike;Narrow base of support Stairs / Additional Locomotion Stairs: No Wheelchair Mobility Wheelchair Mobility: Yes Wheelchair Assistance: Supervision/Verbal cueing Wheelchair Parts Management: Needs assistance Distance: 100  Trunk/Postural Assessment  Cervical Assessment Cervical Assessment: Within Functional Limits Thoracic Assessment Thoracic Assessment: Exceptions to [facility] (rounded shoulders) Lumbar Assessment Lumbar Assessment: Exceptions to [facility] (posterior pelvic tilt) Postural Control Postural Control: Deficits on evaluation (decreased postural control during dynamic sitting/standing, greatly improved from eval from mod A to CGA)  Balance Static  Sitting Balance Static Sitting - Balance Support: Feet supported Static Sitting - Level of Assistance: 5: Stand by assistance Dynamic Sitting Balance Dynamic Sitting - Balance Support: Feet supported Dynamic Sitting - Level of Assistance: 5: Stand by assistance Static Standing Balance Static Standing - Balance Support: Bilateral upper extremity supported Static Standing - Level of Assistance: Not tested (comment);Other (comment) (unable to consistetntly achieve standing without use of stedy, close S to CGA to remain standing in stedy) Extremity Assessment  RUE Assessment RUE Assessment: Within Functional Limits (grossly [facility] for BADL) Active Range of Motion (AROM) Comments: 3/4 to full shoulder flexion General Strength Comments: 4-/5 in shoulder flexion, 35 lb grip strength RUE Body System: Neuro Brunstrum levels for arm and hand: Arm;Hand Brunstrum level for arm: Stage IV Movement is deviating from synergy Brunstrum level for hand: Stage V Independence from basic synergies LUE Assessment LUE Assessment: Within Functional Limits Active Range of Motion (AROM) Comments: 3/4 to full shoulder flexion General Strength Comments: 5/5 in shoulder flexion, 5/5 grip strength RLE Assessment RLE Assessment: Exceptions to [facility] General Strength Comments: Grossly 3+/5 to 4/5 with poor activation and movements out of synergy. LLE Assessment LLE Assessment: Within Functional Limits General Strength Comments: grossly 5/5 with mild L knee pain with manual muscle testing    Lorie Phenix 01/19/2021, 2:46 PM

## 2021-01-19 NOTE — Progress Notes (Signed)
Physical Therapy Session Note  Patient Details  Name: [name] MRN: [id number] Date of Birth: 1951/04/16  Today's Date: 01/19/2021 PT Individual Time: 1100-1155 PT Individual Time Calculation (min): 55 min   Short Term Goals: Week 3:  PT Short Term Goal 1 (Week 3): pt will transfer to [facility] with min assist consistently PT Short Term Goal 2 (Week 3): Pt will perform bed mobility with supervision PT Short Term Goal 3 (Week 3): Pt will ambulate 54ft with Mod A using RW and +2 for safety PT Short Term Goal 4 (Week 3): Pt will propel WC 11ft with supervision assist.  Skilled Therapeutic Interventions/Progress Updates:    Pt received sitting in recliner, awake and agreeable to therapy. She reports no resting pain with L knee but this evolves with weight bearing later in the session. Provided rest breaks and emotional support for pain management during session. She was able to remove hospital socks with supervision but required modA for donning her R sock and R shoe. Sliding board transfer with modA from recliner to w/c with cues for setup, head/hip, and general sequencing. Propelled herself in w/c, ~60ft, requiring min/modA due to veering R. Cues for correction and hand-over-hand assist for RUE to promote stroke efficiency but limited carryover. W/c transport remaining distance to rehab gym and placed in // bars. Required minA for sit<>Stand in // bar and required L foot placed anterior to assist off-loading due to L knee pain. She completed several reps of these while using arms to pull up from bars. Performed pre-gait training with R knee block, performing forward/backward stepping with L foot requiring minA for balance, able to complete several reps. Attempted to perform forward/backward stepping with R foot in // bars but patient unable to initiate adequate hip flexion to advance despite providing external tactile initiation. Pt requesting to perform w/c mobility outside, transported with  totalA. However after being outside for a brief few minutes, pt reporting need to void. Returned to her room with totalA and used stedy to transfer to [facility] over toilet. She's able to stand with CGA in [place] and requiring modA for managing briefs. Pt ended session seated on toilet and instructed on pull-cord to call for staff assist when completed, she voiced understanding. NT also made aware at end of session of patient status.  Therapy Documentation Precautions:  Precautions Precautions: Fall Precaution Comments: decreased L visual field Restrictions Weight Bearing Restrictions: No  Therapy/Group: Individual Therapy  Oseph Imburgia P Sumaya Riedesel PT 01/19/2021, 7:45 AM

## 2021-01-19 NOTE — Progress Notes (Signed)
Recreational Therapy Discharge Summary Patient Details  Name: RENELL COAXUM MRN: [id number] Date of Birth: 1951/06/01 Today's Date: 01/19/2021  Long term goals set: 1  Long term goals met: 0  Comments on progress toward goals: Pt is discharging tomorrow to a skilled facility.  TR not met due to d/c date being moved up a week earlier.  TR sessions focused on leisure education, activity analysis identifying modifications, holistic health and coping strategies.  Reasons for discharge: discharge from hospital  Follow-up: Encourage partication in activities programming as available  Patient/family agrees with progress made and goals achieved: Yes  Darling Cieslewicz 01/19/2021, 3:35 PM

## 2021-01-19 NOTE — Progress Notes (Signed)
Recreational Therapy Session Note  Patient Details  Name: [name] MRN: [id number] Date of Birth: 1951-02-11 Today's Date: 01/19/2021  Pain: no c/o Skilled Therapeutic Interventions/Progress Updates: Session focused on leisure education, activity analysis identifying potential modifications, discharge planning as pt states she plans to discharge to same facility where husband is.  Reviewed all aspects of health including social, emotional, spiritual, cognitive in addition to physical.  Pt stated understanding.  Therapy/Group: Individual Therapy   Azaya Goedde 01/19/2021, 3:28 PM

## 2021-01-19 NOTE — Progress Notes (Signed)
Patient ID: [name] [facility], female   DOB: [birth year], 70 y.o.   MRN: [id number]   [facility] transport scheduled for Friday, January 20, 2021 at 9:00 AM. Due to daughter requesting early transfer.  [place], [place]

## 2021-01-19 NOTE — Progress Notes (Signed)
[facility] PHYSICAL MEDICINE & REHABILITATION PROGRESS NOTE   Subjective/Complaints:  Left knee pain only with WB no falls, xray results reviewed with pt.  Pt indicates she has had cortisone injection in the past , discussed treatment alternatives, discussed no oral NSAIDs  ROS: Patient denies CP, SOB, N/V/D  Objective:   DG Knee 1-2 Views Left  Result Date: 01/18/2021 CLINICAL DATA:  Left knee pain without recent injury. EXAM: LEFT KNEE - 1-2 VIEW COMPARISON:  None. FINDINGS: No evidence of fracture, dislocation, or joint effusion. Mild osteophyte formation is seen involving the lateral joint space. Soft tissues are unremarkable. IMPRESSION: Mild degenerative joint disease. No acute abnormality seen in the left knee. Electronically Signed   By: Marijo Conception M.D.   On: 01/18/2021 14:18   No results for input(s): WBC, HGB, HCT, PLT in the last 72 hours. No results for input(s): NA, K, CL, CO2, GLUCOSE, BUN, CREATININE, CALCIUM in the last 72 hours.  Intake/Output Summary (Last 24 hours) at 01/19/2021 0750 Last data filed at 01/18/2021 1838 Gross per 24 hour  Intake 492 ml  Output --  Net 492 ml        Physical Exam: Vital Signs Blood pressure 109/71, pulse 79, temperature 98.4 F (36.9 C), temperature source Oral, resp. rate 18, height 5\' 8"  (1.727 m), weight 83.4 kg, SpO2 96 %.  General: No acute distress Mood and affect are appropriate Heart: Regular rate and rhythm no rubs murmurs or extra sounds Lungs: Clear to auscultation, breathing unlabored, no rales or wheezes Abdomen: Positive bowel sounds, soft nontender to palpation, nondistended Extremities: No clubbing, cyanosis, or edema   Skin: Left foot 1cm scab over prox 5th metatarsal , no erythema Psych: Normal mood.  Normal behavior. Musc: No edema in extremities.  No tenderness in extremities., left knee without tenderness or effusion No pain with Left Knee ROM Neuro: Alert and oriented x 3. Normal insight and  awareness. Intact Memory. Normal language and speech. Cranial nerve exam unremarkable  Motor: LUE/LLE: 4+/5 proximal distal RUE: 4/5 proximal distal, unchanged RLE: Hip flexion, knee extension 3-/5, ankle dorsiflexion 0/5, unchanged  Assessment/Plan: 1. Functional deficits which require 3+ hours per day of interdisciplinary therapy in a comprehensive inpatient rehab setting.  Physiatrist is providing close team supervision and 24 hour management of active medical problems listed below.  Physiatrist and rehab team continue to assess barriers to discharge/monitor patient progress toward functional and medical goals  Care Tool:  Bathing    Body parts bathed by patient: Right arm,Left arm,Chest,Abdomen,Front perineal area,Right upper leg,Left upper leg,Face,Right lower leg,Left lower leg   Body parts bathed by helper: Buttocks     Bathing assist Assist Level: Dependent - Patient 0% (STS in stedy)     Upper Body Dressing/Undressing Upper body dressing   What is the patient wearing?: Pull over shirt,Bra    Upper body assist Assist Level: Minimal Assistance - Patient > 75%    Lower Body Dressing/Undressing Lower body dressing      What is the patient wearing?: Pants,Incontinence brief     Lower body assist Assist for lower body dressing: Maximal Assistance - Patient 25 - 49%     Toileting Toileting    Toileting assist Assist for toileting: Total Assistance - Patient < 25%     Transfers Chair/bed transfer  Transfers assist     Chair/bed transfer assist level: Dependent - Patient 0% (stedy)     Locomotion Ambulation   Ambulation assist   Ambulation activity did not  occur: Safety/medical concerns (motor control limitations; L knee pain with wt bearing)  Assist level: 2 helpers (+2 for safety) Assistive device: Walker-rolling Max distance: ~45 ft   Walk 10 feet activity   Assist  Walk 10 feet activity did not occur: Safety/medical concerns  Assist level:  Maximal Assistance - Patient 25 - 49% Assistive device: Walker-rolling   Walk 50 feet activity   Assist Walk 50 feet with 2 turns activity did not occur: Safety/medical concerns         Walk 150 feet activity   Assist Walk 150 feet activity did not occur: N/A         Walk 10 feet on uneven surface  activity   Assist Walk 10 feet on uneven surfaces activity did not occur: Safety/medical concerns (motor control limitations)         Wheelchair     Assist Will patient use wheelchair at discharge?: Yes Type of Wheelchair: Manual Wheelchair activity did not occur: Safety/medical concerns (time constraints due to toileting)  Wheelchair assist level: Minimal Assistance - Patient > 75% Max wheelchair distance: 75    Wheelchair 50 feet with 2 turns activity    Assist        Assist Level: Minimal Assistance - Patient > 75%   Wheelchair 150 feet activity     Assist      Assist Level: Total Assistance - Patient < 25%   Blood pressure 109/71, pulse 79, temperature 98.4 F (36.9 C), temperature source Oral, resp. rate 18, height 5\' 8"  (1.727 m), weight 83.4 kg, SpO2 96 %.  Medical Problem List and Plan: 1.Right leg weaknesssecondary to small acute infarct within the anterior right frontal lobe. Multiple old infarcts and chronic small vessel ischemia. New RIght ACA infarct  Continue CIR  PT, OT,     2. Antithrombotics: -DVT/anticoagulation: Transitioned to Eliquis on 1/30 -antiplatelet therapy: Aspirin 81 mg daily 3. Pain Management:Tylenol as needed thigh pain left likely muscular pain from exercise  sportscream  2/7: voltaren gel added for left knee advise tyelnol prior to therapy   Xray -> L knee mild OA, discussed corticosteroid injection , pt wil consider , may ice after therapy , use tyelnol prior to PT, OT, cont diclofenac gel  4. Mood:Aricept 10 mg daily, Lexapro 20 mg daily -antipsychotic agents: N/A 5.  Neuropsych: This patientiscapable of making decisions on herown behalf. 6. Skin/Wound Care:Routine skin checks. May d/c IV 7. Fluids/Electrolytes/Nutrition:Routine in and outs 8. Antihospholipid antibody syndrome/thrombocytopenia. Follow-up hematology services.  Eliquis per neuro  9. Hypertension. Monitor with increased mobility  Cozaar 25mg  daily  2/7: BP is soft to 98/69. Has been low last several reads, decrease Cozaar to 12.5mg .   2/9: BP well controlled to soft: stop Losartan  2/17 controlled  Vitals:   01/18/21 2052 01/19/21 0535  BP: 112/66 109/71  Pulse: 80 79  Resp: 16 18  Temp: 98.3 F (36.8 C) 98.4 F (36.9 C)  SpO2: 97% 96%   10. Hypothyroidism. Synthroid 11. Hyperlipidemia. Lipitor 12. CKD stage III.   Creatinine 1.77 on 1/28, 1.67 on 2/7.  13. Diarrhea. Patient was treated for loose stools daily for the past few weeks. Plan outpatient follow-up GI. Basic stool studies including GI pathogen panel neg, C diff neg, discussed with daughter that now is not a good time to proceed with endoscopy due to recent CVA and anticoagulation  Continue Imodium as needed Reduced lipitor may be helping , also on Aricept since 11/29 this may also cause diarrhea ,  will hold and monitor   Improving off aricept ~2 soft BM per day  14.  Irregular heart rhythm EKG shows occasional PVC no treatment needed  2/11: HR has been well controlled- continue to monitor TID  15.  Spastic bladder due to CVA UA shows some WBC but urine cath , contaminated specimen , would reculture if dysuria occurs or fever  Continue oxybutnin , change to oxybutnin XR  Improved  15.  Acute blood loss anemia  Hemoglobin 10.2 on 1/31, 9/7 on 2/7 16.  Thrombocytopenia  Platelets 100 on 1/31, 65 on 2/7.  17. Ileus/SBO: continue reglan, CT abdomen shows resolution. Diet has been restarted.  18. UTI: UC + for >100,000 GNR.   2/9: susceptibilities returned. Cefepime d/ced. Bactrim DS completed LOS: 21  days A FACE TO FACE EVALUATION WAS PERFORMED  [name] 01/19/2021, 7:50 AM

## 2021-01-19 NOTE — Progress Notes (Signed)
Orthopedic Tech Progress Note Patient Details:  [name] [facility] [birth year] [id number] Called order into [facility] Patient ID: [name], female   DOB: Dec 29, 1950, 70 y.o.   MRN: [id number]   Chip Boer 01/19/2021, 11:38 AM

## 2021-01-19 NOTE — Progress Notes (Signed)
Speech Language Pathology Note  Patient Details  Name: [name] MRN: [id number] Date of Birth: [birth year] Today's Date: 01/19/2021  SLP was re-consulted by MD and therapy team on 01/19/21 due to noted acute changes in attention, problem soving, initiation, and short term memory while staying in CIR that are now different from baseline, per pt's daughter's report. Although ST planned to conduct a cognitive-linguistic evaluation tomorrow 01/20/21 to reassess ST treatment needs, pt is now planning to transfer to a long term care facility tomorrow. Therefore, ST would recommend deferring the evaluation to the next venue of care. She would benefit from thorough evaluation of her cognitive linguistic functioning by the SLP at the long term care facility in order to maximize her functional independence, safety, and carryover of PT/OT therapy interventions.   Thank you for this consult.   Arbutus Leas 01/19/2021, 11:42 AM

## 2021-01-19 NOTE — Plan of Care (Signed)
  Problem: RH Car Transfers Goal: LTG Patient will perform car transfers with assist (PT) Description: LTG: Patient will perform car transfers with assistance (PT). 01/19/2021 1451 by Lorie Phenix, PT Outcome: Adequate for Discharge 01/19/2021 1451 by Lorie Phenix, PT Reactivated   Problem: RH Balance Goal: LTG Patient will maintain dynamic sitting balance (PT) Description: LTG:  Patient will maintain dynamic sitting balance with assistance during mobility activities (PT) 01/19/2021 1451 by Lorie Phenix, PT Outcome: Not Met (add Reason) 01/19/2021 1451 by Lorie Phenix, PT Reactivated Goal: LTG Patient will maintain dynamic standing balance (PT) Description: LTG:  Patient will maintain dynamic standing balance with assistance during mobility activities (PT) 01/19/2021 1451 by Lorie Phenix, PT Outcome: Not Met (add Reason) 01/19/2021 1451 by Lorie Phenix, PT Reactivated   Problem: Sit to Stand Goal: LTG:  Patient will perform sit to stand with assistance level (PT) Description: LTG:  Patient will perform sit to stand with assistance level (PT) Outcome: Completed/Met   Problem: RH Bed Mobility Goal: LTG Patient will perform bed mobility with assist (PT) Description: LTG: Patient will perform bed mobility with assistance, with/without cues (PT). Outcome: Completed/Met   Problem: RH Bed to Chair Transfers Goal: LTG Patient will perform bed/chair transfers w/assist (PT) Description: LTG: Patient will perform bed to chair transfers with assistance (PT). Outcome: Completed/Met   Problem: RH Ambulation Goal: LTG Patient will ambulate in controlled environment (PT) Description: LTG: Patient will ambulate in a controlled environment, # of feet with assistance (PT). Outcome: Completed/Met Goal: LTG Patient will ambulate in home environment (PT) Description: LTG: Patient will ambulate in home environment, # of feet with assistance (PT). Outcome: Completed/Met    Problem: RH Wheelchair Mobility Goal: LTG Patient will propel w/c in controlled environment (PT) Description: LTG: Patient will propel wheelchair in controlled environment, # of feet with assist (PT) Outcome: Completed/Met

## 2021-01-19 NOTE — Progress Notes (Signed)
Patient ID: [name] [facility], female   DOB: 08-28-1951, 70 y.o.   MRN: [id number]   Sw received follow up from [facility]. Facility transfer set for tomorrow. Daughter in agreement.  [place], [place]

## 2021-01-19 NOTE — Plan of Care (Signed)
Incontinent of bowel and bladder at times, maintained on Ditropan

## 2021-01-19 NOTE — Progress Notes (Signed)
Occupational Therapy Discharge Summary  Patient Details  Name: [name] MRN: [id number] Date of Birth: 01/07/1951  Patient has met 5 of 11 long term goals due to improved activity tolerance, improved balance, postural control, ability to compensate for deficits, functional use of  RIGHT upper extremity, improved attention, improved awareness and improved coordination.  Patient to discharge at overall Mod Assist level.  Patient's care partner is NA as pt is DC to LTC.   Reasons goals not met: Pt progressed slower than initially anticipated, with hospital course complicated by additional CVA and chronic L knee pain impairing func mobility. Pt cont to dem significant initiation, motor planning, and problem solving deficits with poor carry over of education impairing functional mobility and ADL performance. Pt additionally DC to LTC earlier than anticipated.  Recommendation:  Patient will benefit from ongoing skilled OT services in home health setting to continue to advance functional skills in the area of BADL.  Equipment: No equipment provided  Reasons for discharge: discharge from hospital  Patient/family agrees with progress made and goals achieved: Yes  OT Discharge Precautions/Restrictions  Precautions Precautions: Fall Precaution Comments: decreased L visual field Restrictions Weight Bearing Restrictions: No Pain Pain Assessment Pain Scale: Faces Faces Pain Scale: Hurts little more Pain Type: Acute pain Pain Location: Knee Pain Orientation: Left Pain Onset: With Activity Pain Intervention(s): Rest ADL ADL Equipment Provided: Reacher,Sock aid,Long-handled sponge Eating: Supervision/safety Where Assessed-Eating: Wheelchair Grooming: Supervision/safety Where Assessed-Grooming: Chair,Sitting at sink Upper Body Bathing: Supervision/safety Where Assessed-Upper Body Bathing: Shower Lower Body Bathing: Minimal assistance (lateral leans for buttocks) Where  Assessed-Lower Body Bathing: Shower Upper Body Dressing: Supervision/safety Where Assessed-Upper Body Dressing: Wheelchair Lower Body Dressing: Maximal assistance Where Assessed-Lower Body Dressing: Bed level Toileting: Dependent Where Assessed-Toileting: Glass blower/designer: Psychiatric nurse Method: Theatre manager: Sport and exercise psychologist Transfer: Dependent Social research officer, government Method: Other (comment) (stedy) Walk-In Engineer, site: Transfer tub bench,Grab bars Vision Baseline Vision/History: Wears glasses (Left homonymous hemianopia from remote CVA) Wears Glasses: At all times Patient Visual Report: Blurring of vision (c/o of blurry vision since 12/21 and that she needs a new glasses prescription) Vision Assessment?: Vision impaired- to be further tested in functional context Perception  Perception: Impaired Inattention/Neglect: Does not attend to right visual field;Does not attend to right side of body Praxis Praxis: Impaired Praxis Impairment Details: Initiation;Ideomotor;Motor planning Praxis-Other Comments: Cont to req min VCs for intitiation of func trasnfers, mod to max for motor planning during func transfers and problem solving complex ADL Cognition Overall Cognitive Status: Impaired/Different from baseline Arousal/Alertness: Awake/alert Orientation Level: Oriented X4 Attention: Sustained Sustained Attention: Impaired Sustained Attention Impairment: Verbal complex;Functional complex Memory: Impaired Memory Impairment: Decreased recall of new information Problem Solving: Impaired Problem Solving Impairment: Verbal complex;Functional complex Safety/Judgment: Appears intact Comments: Req minimal reminders of next task to complete during session despite having heard explanation ~2 min prior Sensation Sensation Light Touch: Appears Intact Hot/Cold: Appears Intact Proprioception: Impaired by gross  assessment Stereognosis: Appears Intact Coordination Gross Motor Movements are Fluid and Coordinated: No Fine Motor Movements are Fluid and Coordinated: Yes Coordination and Movement Description: R hemiparesis RLE>RUE, impaired BLE coordination during func mobility 2/2 L knee pain; improved BUE coordination from eval 9 Hole Peg Test: R hand: 1 min 41 seconds Motor  Motor Motor: Hemiplegia;Abnormal postural alignment and control Motor - Discharge Observations: R hemiplegia RLE>RUE Mobility  Bed Mobility Bed Mobility: Rolling Right;Rolling Left;Right Sidelying to Sit;Supine to Sit;Scooting to [facility];Sit to Supine Rolling Right: Supervision/verbal cueing  Rolling Left: Minimal Assistance - Patient > 75% Right Sidelying to Sit: Moderate Assistance - Patient 50-74%;Minimal Assistance - Patient > 75% Sit to Supine: Minimal Assistance - Patient > 75% Scooting to HOB: Moderate Assistance - Patient 50-74% Transfers Sit to Stand: Dependent - mechanical lift Stand to Sit: Dependent - mechanical lift  Trunk/Postural Assessment  Cervical Assessment Cervical Assessment: Within Functional Limits Thoracic Assessment Thoracic Assessment: Exceptions to [facility] (rounded shoulders) Lumbar Assessment Lumbar Assessment: Exceptions to [facility] (posterior pelvic tilt) Postural Control Postural Control: Deficits on evaluation (decreased postural control during dynamic sitting/standing, greatly improved from eval from mod A to CGA)  Balance Static Sitting Balance Static Sitting - Balance Support: Feet supported Static Sitting - Level of Assistance: 5: Stand by assistance Dynamic Sitting Balance Dynamic Sitting - Balance Support: Feet supported Dynamic Sitting - Level of Assistance: 5: Stand by assistance Static Standing Balance Static Standing - Balance Support: Bilateral upper extremity supported Static Standing - Level of Assistance: Not tested (comment);Other (comment) (unable to consistetntly achieve standing  without use of stedy, close S to CGA to remain standing in stedy) Extremity/Trunk Assessment RUE Assessment RUE Assessment: Within Functional Limits (grossly [facility] for BADL) Active Range of Motion (AROM) Comments: 3/4 to full shoulder flexion General Strength Comments: 4-/5 in shoulder flexion, 35 lb grip strength RUE Body System: Neuro Brunstrum levels for arm and hand: Arm;Hand Brunstrum level for arm: Stage IV Movement is deviating from synergy Brunstrum level for hand: Stage V Independence from basic synergies LUE Assessment LUE Assessment: Within Functional Limits Active Range of Motion (AROM) Comments: 3/4 to full shoulder flexion General Strength Comments: 5/5 in shoulder flexion, 5/5 grip strength   Volanda Napoleon MS, OTR/L  01/19/2021, 12:55 PM

## 2021-01-19 NOTE — Progress Notes (Signed)
Physical Therapy Session Note  Patient Details  Name: [name] MRN: [id number] Date of Birth: 12-21-1950  Today's Date: 01/19/2021 PT Individual Time: 0800-0830 PT Individual Time Calculation (min): 30 min   Short Term Goals: Week 3:  PT Short Term Goal 1 (Week 3): pt will transfer to [facility] with min assist consistently PT Short Term Goal 2 (Week 3): Pt will perform bed mobility with supervision PT Short Term Goal 3 (Week 3): Pt will ambulate 67ft with Mod A using RW and +2 for safety PT Short Term Goal 4 (Week 3): Pt will propel WC 161ft with supervision assist.  Skilled Therapeutic Interventions/Progress Updates:     Patient in recliner with NT following toileting in the room upon PT arrival. Patient alert and agreeable to PT session. Patient reported 8/10 L knee pain in standing during session, RN made aware. PT provided repositioning, rest breaks, and distraction as pain interventions throughout session.   Therapeutic Activity: Transfers: Patient threaded pants over her feet with max A for R and set-up on L with cues for hemi-technique. Patient then attempted to pull her pants over her hips performing lateral leans, however, increased challenge clearing her R hip. She then performed sit to/from stand x1 in the [place] with min A to allow therapist to pull up patient's pants over her hips with total A. Patient with significant increased L knee pain in standing and poor standing tolerance and posture due to pain.   Assessment of L knee, noted TTP at joint line bilaterally, no signs of increased edema or discoloration.   Patient had not eaten breakfast and requested to eat breakfast during remainder of session. Focused remainder of session on stroke education on signs and symptoms, BP monitoring, management, and critical values, and life-style changes for reduced stroke risk.   Patient in recliner eating breakfast at end of session with breaks locked, seat blet alarm set, and all needs  within reach.    Therapy Documentation Precautions:  Precautions Precautions: Fall Precaution Comments: decreased L visual field Restrictions Weight Bearing Restrictions: No   Therapy/Group: Individual Therapy  [name] PT, DPT  01/19/2021, 12:33 PM

## 2021-01-19 NOTE — Progress Notes (Signed)
Occupational Therapy Session Note  Patient Details  Name: [name] MRN: [id number] Date of Birth: 01/18/1951  Today's Date: 01/19/2021 OT Individual Time: 3967-2897 OT Individual Time Calculation (min): 53 min    Short Term Goals: Week 3:  OT Short Term Goal 1 (Week 3): Pt will thread BLE into pants with min A + AE PRN. OT Short Term Goal 2 (Week 3): Pt will complete STS consistently with mod A in prep for standing ADL. OT Short Term Goal 3 (Week 3): Pt will complete LB clothing management during toileting with min A. OT Short Term Goal 4 (Week 3): Pt will complete peri care during toileting with min A.  Skilled Therapeutic Interventions/Progress Updates:    Pt received in recliner, agreeable to therapy. Session focus on func mobility, adaptive LBD, and grooming. Doffed/donned shirt with close S + min VCs for orientation of shirt. Trialed different ways to facilitate don/doffing of pants including seated and at bed level. Pt with difficulty performing push up from chair while simultaneously pulling pants down over hips. Attempted STS, but pt c/o of L knee pain upon L knee extension, perseverating on "not being able to do it." Stedy transfer to bed with mod A for initial power up, progressing to CGA throughout session. Min R lean in perched position. Sitting EOB > supine with min A to bring RLE onto bed/readjust trunk. With assist to bend RLE, pt able to achieve bridge position, but with difficulty coordinating BLE to pull pants down over hips despite increased time and demonstrational cuing. Pt able to remove LLE from pants and thread LLE, but cont to req max A to don pants at bed level. Total A to don/doff B socks and brief. Supine > sitting EOB with mod A to lift trunk. Stedy transfer to sink, in perched position brushed teeth with min VCs to locate toothpaste on R side and washed face. Pt with noted increased STM deficits, req to use mouth wash and then confused 1 min later when present  with mouth wash. Stedy transfer to recliner. Pt left in recliner with chair alarm engaged, call bell in reach, and all immediate needs met.    Therapy Documentation Precautions:  Precautions Precautions: Fall Precaution Comments: decreased L visual field Restrictions Weight Bearing Restrictions: No Pain: Pain Assessment Pain Scale: Faces Faces Pain Scale: Hurts little more Pain Type: Acute pain Pain Location: Knee Pain Orientation: Left Pain Onset: With Activity Pain Intervention(s): Rest ADL: See Care Tool for more details.   Therapy/Group: Individual Therapy  Volanda Napoleon MS, OTR/L  01/19/2021, 9:51 AM

## 2021-01-20 DIAGNOSIS — E785 Hyperlipidemia, unspecified: Secondary | ICD-10-CM | POA: Diagnosis not present

## 2021-01-20 DIAGNOSIS — I10 Essential (primary) hypertension: Secondary | ICD-10-CM | POA: Diagnosis not present

## 2021-01-20 DIAGNOSIS — I63521 Cerebral infarction due to unspecified occlusion or stenosis of right anterior cerebral artery: Secondary | ICD-10-CM | POA: Diagnosis not present

## 2021-01-20 DIAGNOSIS — Z736 Limitation of activities due to disability: Secondary | ICD-10-CM | POA: Diagnosis not present

## 2021-01-20 DIAGNOSIS — L89109 Pressure ulcer of unspecified part of back, unspecified stage: Secondary | ICD-10-CM | POA: Diagnosis not present

## 2021-01-20 DIAGNOSIS — Z7401 Bed confinement status: Secondary | ICD-10-CM | POA: Diagnosis not present

## 2021-01-20 DIAGNOSIS — E039 Hypothyroidism, unspecified: Secondary | ICD-10-CM | POA: Diagnosis not present

## 2021-01-20 DIAGNOSIS — E569 Vitamin deficiency, unspecified: Secondary | ICD-10-CM | POA: Diagnosis not present

## 2021-01-20 DIAGNOSIS — I6612 Occlusion and stenosis of left anterior cerebral artery: Secondary | ICD-10-CM | POA: Diagnosis not present

## 2021-01-20 DIAGNOSIS — J302 Other seasonal allergic rhinitis: Secondary | ICD-10-CM | POA: Diagnosis not present

## 2021-01-20 DIAGNOSIS — L89893 Pressure ulcer of other site, stage 3: Secondary | ICD-10-CM | POA: Diagnosis not present

## 2021-01-20 DIAGNOSIS — N390 Urinary tract infection, site not specified: Secondary | ICD-10-CM | POA: Diagnosis not present

## 2021-01-20 DIAGNOSIS — R5381 Other malaise: Secondary | ICD-10-CM | POA: Diagnosis not present

## 2021-01-20 DIAGNOSIS — M2550 Pain in unspecified joint: Secondary | ICD-10-CM | POA: Diagnosis not present

## 2021-01-20 DIAGNOSIS — I6930 Unspecified sequelae of cerebral infarction: Secondary | ICD-10-CM | POA: Diagnosis not present

## 2021-01-20 DIAGNOSIS — D509 Iron deficiency anemia, unspecified: Secondary | ICD-10-CM | POA: Diagnosis not present

## 2021-01-20 DIAGNOSIS — F32A Depression, unspecified: Secondary | ICD-10-CM | POA: Diagnosis not present

## 2021-01-20 DIAGNOSIS — M6281 Muscle weakness (generalized): Secondary | ICD-10-CM | POA: Diagnosis not present

## 2021-01-20 DIAGNOSIS — L22 Diaper dermatitis: Secondary | ICD-10-CM | POA: Diagnosis not present

## 2021-01-20 DIAGNOSIS — J3489 Other specified disorders of nose and nasal sinuses: Secondary | ICD-10-CM | POA: Diagnosis not present

## 2021-01-20 DIAGNOSIS — N1831 Chronic kidney disease, stage 3a: Secondary | ICD-10-CM | POA: Diagnosis not present

## 2021-01-20 DIAGNOSIS — I69351 Hemiplegia and hemiparesis following cerebral infarction affecting right dominant side: Secondary | ICD-10-CM | POA: Diagnosis not present

## 2021-01-20 DIAGNOSIS — I69951 Hemiplegia and hemiparesis following unspecified cerebrovascular disease affecting right dominant side: Secondary | ICD-10-CM | POA: Diagnosis not present

## 2021-01-20 DIAGNOSIS — R197 Diarrhea, unspecified: Secondary | ICD-10-CM | POA: Diagnosis not present

## 2021-01-20 NOTE — Progress Notes (Signed)
Pt discharged to [facility] via [facility]. Report given to RN, Caryl Pina. Tylenol given prior to DC for comfort. Belongings and discharge instructions sent with pt. Pt stable at time of discharge.   [name] W Perley Arthurs

## 2021-01-20 NOTE — Progress Notes (Signed)
Inpatient Rehabilitation Care Coordinator Discharge Note  The overall goal for the admission was met for:   Discharge location: Yes, SNF ([facility])  Length of Stay: Yes, 22 Days   Discharge activity level: Yes, wheelchair level Mod Assist  Home/community participation: Yes  Services provided included: MD, RD, PT, OT, SLP, RN, CM, TR, Pharmacy, Neuropsych and SW  Financial Services: Private Insurance: [facility] Medicare   Choices offered to/list presented GY:YOCHVGW and patient daughter   Follow-up services arranged: Other: Continued therapy at SNF  Comments (or additional information):  Patient/Family verbalized understanding of follow-up arrangements: Yes  Individual responsible for coordination of the follow-up plan: Ramiro Harvest, [phone]  Confirmed correct DME delivered: [name] 01/20/2021    [name]

## 2021-01-20 NOTE — Progress Notes (Signed)
[facility] PHYSICAL MEDICINE & REHABILITATION PROGRESS NOTE   Subjective/Complaints:  Left knee pain better when wearing KO, aware of d/c to SNF today   ROS: Patient denies CP, SOB, N/V/D  Objective:   DG Knee 1-2 Views Left  Result Date: 01/18/2021 CLINICAL DATA:  Left knee pain without recent injury. EXAM: LEFT KNEE - 1-2 VIEW COMPARISON:  None. FINDINGS: No evidence of fracture, dislocation, or joint effusion. Mild osteophyte formation is seen involving the lateral joint space. Soft tissues are unremarkable. IMPRESSION: Mild degenerative joint disease. No acute abnormality seen in the left knee. Electronically Signed   By: Marijo Conception M.D.   On: 01/18/2021 14:18   No results for input(s): WBC, HGB, HCT, PLT in the last 72 hours. No results for input(s): NA, K, CL, CO2, GLUCOSE, BUN, CREATININE, CALCIUM in the last 72 hours.  Intake/Output Summary (Last 24 hours) at 01/20/2021 0756 Last data filed at 01/20/2021 0545 Gross per 24 hour  Intake 657 ml  Output 1100 ml  Net -443 ml        Physical Exam: Vital Signs Blood pressure 103/64, pulse 84, temperature 97.7 F (36.5 C), temperature source Oral, resp. rate 16, height 5\' 8"  (1.727 m), weight 83.4 kg, SpO2 97 %.   General: No acute distress Mood and affect are appropriate Heart: Regular rate and rhythm no rubs murmurs or extra sounds Lungs: Clear to auscultation, breathing unlabored, no rales or wheezes Abdomen: Positive bowel sounds, soft nontender to palpation, nondistended Extremities: No clubbing, cyanosis, or edema  Psych: Normal mood.  Normal behavior. Musc: No edema in extremities.  No tenderness in extremities., left knee without tenderness or effusion No pain with Left Knee ROM Neuro: Alert and oriented x 3. Normal insight and awareness. Intact Memory. Normal language and speech. Cranial nerve exam unremarkable  Motor: LUE/LLE: 4+/5 proximal distal RUE: 4/5 proximal distal, unchanged RLE: Hip flexion, knee  extension 3-/5, ankle dorsiflexion 0/5, 0/5 toe flex/ext  Assessment/Plan: 1. Functional deficits due to L ACA infarct Stable for D/C today F/u PCP in 3-4 weeks F/u PM&R 2 weeks See D/C summary See D/C instructions Care Tool:  Bathing    Body parts bathed by patient: Right arm,Left arm,Chest,Abdomen,Front perineal area,Right upper leg,Left upper leg,Face,Right lower leg,Left lower leg (LH sponge)   Body parts bathed by helper: Buttocks     Bathing assist Assist Level: Minimal Assistance - Patient > 75% (min for buttocks via lateral leans)     Upper Body Dressing/Undressing Upper body dressing   What is the patient wearing?: Pull over shirt    Upper body assist Assist Level: Supervision/Verbal cueing    Lower Body Dressing/Undressing Lower body dressing      What is the patient wearing?: Pants     Lower body assist Assist for lower body dressing: Maximal Assistance - Patient 25 - 49%     Toileting Toileting    Toileting assist Assist for toileting: Total Assistance - Patient < 25%     Transfers Chair/bed transfer  Transfers assist     Chair/bed transfer assist level: Minimal Assistance - Patient > 75% (SB transfer)     Locomotion Ambulation   Ambulation assist   Ambulation activity did not occur: Safety/medical concerns (motor control limitations; L knee pain with wt bearing)  Assist level: Moderate Assistance - Patient 50 - 74% Assistive device: Walker-rolling Max distance: 30   Walk 10 feet activity   Assist  Walk 10 feet activity did not occur: Safety/medical concerns  Assist  level: Moderate Assistance - Patient - 50 - 74% Assistive device: Walker-rolling   Walk 50 feet activity   Assist Walk 50 feet with 2 turns activity did not occur: Safety/medical concerns         Walk 150 feet activity   Assist Walk 150 feet activity did not occur: Safety/medical concerns         Walk 10 feet on uneven surface  activity   Assist  Walk 10 feet on uneven surfaces activity did not occur: Safety/medical concerns         Wheelchair     Assist Will patient use wheelchair at discharge?: Yes Type of Wheelchair: Manual Wheelchair activity did not occur: Safety/medical concerns (time constraints due to toileting)  Wheelchair assist level: Supervision/Verbal cueing Max wheelchair distance: 100    Wheelchair 50 feet with 2 turns activity    Assist        Assist Level: Supervision/Verbal cueing   Wheelchair 150 feet activity     Assist  Wheelchair 150 feet activity did not occur: Refused   Assist Level: Total Assistance - Patient < 25%   Blood pressure 103/64, pulse 84, temperature 97.7 F (36.5 C), temperature source Oral, resp. rate 16, height 5\' 8"  (1.727 m), weight 83.4 kg, SpO2 97 %.  Medical Problem List and Plan: 1.Right leg weaknesssecondary to small acute infarct within the anterior right frontal lobe. Multiple old infarcts and chronic small vessel ischemia. New RIght ACA infarct  Continue CIR  PT, OT,     2. Antithrombotics: -DVT/anticoagulation: Transitioned to Eliquis on 1/30 -antiplatelet therapy: Aspirin 81 mg daily 3. Pain Management:Tylenol as needed thigh pain left likely muscular pain from exercise  sportscream  2/7: voltaren gel added for left knee advise tyelnol prior to therapy   Xray -> L knee mild OA, discussed corticosteroid injection , pt wil consider , may ice after therapy , use tylenol prior to PT, OT, cont diclofenac gel  L Knee orthosis when up 4. Mood:Aricept 10 mg daily, Lexapro 20 mg daily -antipsychotic agents: N/A 5. Neuropsych: This patientiscapable of making decisions on herown behalf. 6. Skin/Wound Care:Routine skin checks. May d/c IV 7. Fluids/Electrolytes/Nutrition:Routine in and outs 8. Antihospholipid antibody syndrome/thrombocytopenia. Follow-up hematology services.  Eliquis per neuro  9. Hypertension. Monitor  with increased mobility  Cozaar 25mg  daily  2/7: BP is soft to 98/69. Has been low last several reads, decrease Cozaar to 12.5mg .   2/9: BP well controlled to soft: stop Losartan  2/18 controlled  Vitals:   01/19/21 2031 01/20/21 0546  BP: 104/66 103/64  Pulse: 84 84  Resp: 16 16  Temp: 98.7 F (37.1 C) 97.7 F (36.5 C)  SpO2: 96% 97%   10. Hypothyroidism. Synthroid 11. Hyperlipidemia. Lipitor 12. CKD stage III.   Creatinine 1.77 on 1/28, 1.67 on 2/7.  13. Diarrhea. Patient was treated for loose stools daily for the past few weeks. Plan outpatient follow-up GI. Basic stool studies including GI pathogen panel neg, C diff neg, discussed with daughter that now is not a good time to proceed with endoscopy due to recent CVA and anticoagulation  Continue Imodium as needed Reduced lipitor may be helping , also on Aricept since 11/29 this may also cause diarrhea , will hold and monitor   Improving off aricept ~2 soft BM per day  14.  Irregular heart rhythm EKG shows occasional PVC no treatment needed  2/11: HR has been well controlled- continue to monitor TID  15.  Spastic bladder due to CVA  UA shows some WBC but urine cath , contaminated specimen , would reculture if dysuria occurs or fever  Continue oxybutnin , change to oxybutnin XR- difficult to predict duration of need given multiple CVAs may be an ongoing need   Improved  15.  Acute blood loss anemia  Hemoglobin 10.2 on 1/31, 9/7 on 2/7 16.  Thrombocytopenia  Platelets 100 on 1/31, 65 on 2/7.  17. Ileus/SBO: continue reglan, CT abdomen shows resolution. Diet has been restarted.  18. UTI: UC + for >100,000 GNR.   2/9: susceptibilities returned. Cefepime d/ced. Bactrim DS completed LOS: 22 days A FACE TO FACE EVALUATION WAS PERFORMED  Charlett Blake 01/20/2021, 7:56 AM

## 2021-01-20 NOTE — Plan of Care (Signed)
Problem: Sit to Stand Goal: LTG:  Patient will perform sit to stand in prep for activites of daily living with assistance level (OT) Description: LTG:  Patient will perform sit to stand in prep for activites of daily living with assistance level (OT) Outcome: Not Met (add Reason) Flowsheets (Taken 01/20/2021 0650) LTG: PT will perform sit to stand in prep for activites of daily living with assistance level: (Pt not met 2/2 L knee pain and poor motor planning.) -- Note: Pt not met 2/2 L knee pain and poor motor planning.   Problem: RH Eating Goal: LTG Patient will perform eating w/assist, cues/equip (OT) Description: LTG: Patient will perform eating with assist, with/without cues using equipment (OT) Outcome: Not Met (add Reason) Flowsheets (Taken 01/20/2021 0650) LTG: Pt will perform eating with assistance level of: (Pt not met 2/2 decreased problem solving and poor motor planning.) -- Note: Pt not met 2/2 decreased problem solving and poor motor planning.   Problem: RH Dressing Goal: LTG Patient will perform lower body dressing w/assist (OT) Description: LTG: Patient will perform lower body dressing with assist, with/without cues in positioning using equipment (OT) Outcome: Not Met (add Reason) Flowsheets (Taken 01/20/2021 0650) LTG: Pt will perform lower body dressing with assistance level of: (Pt not met 2/2 decreased problem solving, L knee pain, and poor motor planning) -- Note: Pt not met 2/2 decreased problem solving, L knee pain, and poor motor planning   Problem: RH Toileting Goal: LTG Patient will perform toileting task (3/3 steps) with assistance level (OT) Description: LTG: Patient will perform toileting task (3/3 steps) with assistance level (OT)  01/20/2021 0654 by Volanda Napoleon, OT Outcome: Not Met (add Reason) Flowsheets (Taken 01/20/2021 0654) LTG: Pt will perform toileting task (3/3 steps) with assistance level: (Pt not met 2/2 decreased problem solving, L knee  pain, and poor motor planning) -- Note: Pt not met 2/2 decreased problem solving, L knee pain, and poor motor planning 01/20/2021 0654 by Volanda Napoleon, OT Reactivated 01/20/2021 0650 by Volanda Napoleon, OT Outcome: Not Applicable Flowsheets (Taken 01/20/2021 0650) LTG: Pt will perform toileting task (3/3 steps) with assistance level: (Pt not met 2/2 decreased problem solving, L knee pain, and poor motor planning) -- Note: Pt not met 2/2 decreased problem solving, L knee pain, and poor motor planning   Problem: RH Functional Use of Upper Extremity Goal: LTG Patient will use RT/LT upper extremity as a (OT) Description: LTG: Patient will use right/left upper extremity as a stabilizer/gross assist/diminished/nondominant/dominant level with assist, with/without cues during functional activity (OT) Outcome: Not Met (add Reason) Flowsheets (Taken 01/20/2021 0650) LTG: Use of upper extremity in functional activities: (Pt not met 2/2 decreased problem solving, L knee pain, and poor motor planning) -- Note: Pt not met 2/2 decreased problem solving, L knee pain, and poor motor planning   Problem: RH Tub/Shower Transfers Goal: LTG Patient will perform tub/shower transfers w/assist (OT) Description: LTG: Patient will perform tub/shower transfers with assist, with/without cues using equipment (OT) Outcome: Not Met (add Reason) Flowsheets (Taken 01/20/2021 0650) LTG: Pt will perform tub/shower stall transfers with assistance level of: (Pt not met 2/2 decreased problem solving, L knee pain, and poor motor planning) -- Note: Pt not met 2/2 decreased problem solving, L knee pain, and poor motor planning   Problem: RH Balance Goal: LTG: Patient will maintain dynamic sitting balance (OT) Description: LTG:  Patient will maintain dynamic sitting balance with assistance during activities of daily living (OT) Outcome: Completed/Met  Problem: RH Grooming Goal: LTG Patient will perform grooming  w/assist,cues/equip (OT) Description: LTG: Patient will perform grooming with assist, with/without cues using equipment (OT) Outcome: Completed/Met   Problem: RH Bathing Goal: LTG Patient will bathe all body parts with assist levels (OT) Description: LTG: Patient will bathe all body parts with assist levels (OT) Outcome: Completed/Met Note: Lateral leans for buttocks   Problem: RH Dressing Goal: LTG Patient will perform upper body dressing (OT) Description: LTG Patient will perform upper body dressing with assist, with/without cues (OT). Outcome: Completed/Met   Problem: RH Toilet Transfers Goal: LTG Patient will perform toilet transfers w/assist (OT) Description: LTG: Patient will perform toilet transfers with assist, with/without cues using equipment (OT) 01/20/2021 0654 by Volanda Napoleon, OT Outcome: Completed/Met 01/20/2021 0650 by Volanda Napoleon, OT Outcome: Completed/Met Note: SB transfer to [facility] next to bed   Problem: RH Balance Goal: LTG Patient will maintain dynamic standing with ADLs (OT) Description: LTG:  Patient will maintain dynamic standing balance with assist during activities of daily living (OT)  Outcome: Not Applicable Flowsheets (Taken 01/20/2021 0650) LTG: Pt will maintain dynamic standing balance during ADLs with: (Previously DC as pt not safe to complete standing ADL upon DC) -- Note: Previously DC as pt not safe to complete standing ADL upon DC

## 2021-01-23 DIAGNOSIS — E039 Hypothyroidism, unspecified: Secondary | ICD-10-CM | POA: Diagnosis not present

## 2021-01-23 DIAGNOSIS — I69351 Hemiplegia and hemiparesis following cerebral infarction affecting right dominant side: Secondary | ICD-10-CM | POA: Diagnosis not present

## 2021-01-23 DIAGNOSIS — N1831 Chronic kidney disease, stage 3a: Secondary | ICD-10-CM | POA: Diagnosis not present

## 2021-01-23 DIAGNOSIS — I69951 Hemiplegia and hemiparesis following unspecified cerebrovascular disease affecting right dominant side: Secondary | ICD-10-CM | POA: Diagnosis not present

## 2021-01-25 DIAGNOSIS — L89893 Pressure ulcer of other site, stage 3: Secondary | ICD-10-CM | POA: Diagnosis not present

## 2021-01-29 ENCOUNTER — Other Ambulatory Visit: Payer: Self-pay | Admitting: Family Medicine

## 2021-01-29 DIAGNOSIS — Z7901 Long term (current) use of anticoagulants: Secondary | ICD-10-CM

## 2021-01-31 DIAGNOSIS — I69351 Hemiplegia and hemiparesis following cerebral infarction affecting right dominant side: Secondary | ICD-10-CM | POA: Diagnosis not present

## 2021-01-31 DIAGNOSIS — N1831 Chronic kidney disease, stage 3a: Secondary | ICD-10-CM | POA: Diagnosis not present

## 2021-02-01 DIAGNOSIS — L89893 Pressure ulcer of other site, stage 3: Secondary | ICD-10-CM | POA: Diagnosis not present

## 2021-02-02 DIAGNOSIS — I69351 Hemiplegia and hemiparesis following cerebral infarction affecting right dominant side: Secondary | ICD-10-CM | POA: Diagnosis not present

## 2021-02-02 DIAGNOSIS — R197 Diarrhea, unspecified: Secondary | ICD-10-CM | POA: Diagnosis not present

## 2021-02-03 DIAGNOSIS — R197 Diarrhea, unspecified: Secondary | ICD-10-CM | POA: Diagnosis not present

## 2021-02-03 DIAGNOSIS — E039 Hypothyroidism, unspecified: Secondary | ICD-10-CM | POA: Diagnosis not present

## 2021-02-03 DIAGNOSIS — I69351 Hemiplegia and hemiparesis following cerebral infarction affecting right dominant side: Secondary | ICD-10-CM | POA: Diagnosis not present

## 2021-02-03 DIAGNOSIS — J3489 Other specified disorders of nose and nasal sinuses: Secondary | ICD-10-CM | POA: Diagnosis not present

## 2021-02-03 DIAGNOSIS — N1831 Chronic kidney disease, stage 3a: Secondary | ICD-10-CM | POA: Diagnosis not present

## 2021-02-07 ENCOUNTER — Telehealth: Payer: Self-pay | Admitting: *Deleted

## 2021-02-07 DIAGNOSIS — R197 Diarrhea, unspecified: Secondary | ICD-10-CM | POA: Diagnosis not present

## 2021-02-07 DIAGNOSIS — N390 Urinary tract infection, site not specified: Secondary | ICD-10-CM | POA: Diagnosis not present

## 2021-02-07 DIAGNOSIS — L89109 Pressure ulcer of unspecified part of back, unspecified stage: Secondary | ICD-10-CM | POA: Diagnosis not present

## 2021-02-07 NOTE — Telephone Encounter (Signed)
[name] of long term unit reports that [name] says that Dr Letta Pate stopped her aricept. They do not have an order to stop it.  Please clarify.

## 2021-02-07 NOTE — Telephone Encounter (Signed)
[name] notified.

## 2021-02-07 NOTE — Telephone Encounter (Signed)
I see the Aricept on the discharge summary.  I believe she may have had some adverse effects from that and she can certainly hold it.  We can address it when I see her in clinic in follow-up.

## 2021-02-08 ENCOUNTER — Other Ambulatory Visit: Payer: Self-pay

## 2021-02-08 ENCOUNTER — Telehealth: Payer: Self-pay

## 2021-02-08 DIAGNOSIS — F3342 Major depressive disorder, recurrent, in full remission: Secondary | ICD-10-CM

## 2021-02-08 DIAGNOSIS — Z7901 Long term (current) use of anticoagulants: Secondary | ICD-10-CM

## 2021-02-08 DIAGNOSIS — L22 Diaper dermatitis: Secondary | ICD-10-CM | POA: Diagnosis not present

## 2021-02-08 DIAGNOSIS — Z8673 Personal history of transient ischemic attack (TIA), and cerebral infarction without residual deficits: Secondary | ICD-10-CM

## 2021-02-08 MED ORDER — BUPROPION HCL ER (XL) 150 MG PO TB24: 150.0000 mg | ORAL_TABLET | Freq: Every day | ORAL | 0 refills | Status: AC

## 2021-02-08 NOTE — Telephone Encounter (Signed)
Called and left a message on daughter's phone that max dose already on Lexapro. We can add Wellbutrin XL 150mg  qday to her regimen if she would like, but need for her to set up a virtual visit in 2 weeks for follow up depression/ anxiety. I added the med to pt's list and sent Chassidy a message to send it in to [facility] [place] if Hillary calls back with the okay. Will do VV in 2 weeks for follow up, if this is not a good choice, will send referral to psych

## 2021-02-08 NOTE — Telephone Encounter (Signed)
Pt is in rehab, would like a VV

## 2021-02-08 NOTE — Telephone Encounter (Signed)
She needs to be seen before any changes on her depression/ anxiety med. Ask her to bring her next week

## 2021-02-08 NOTE — Telephone Encounter (Signed)
Copied from [place] [id number]. Topic: General - Other >> Feb 08, 2021  9:45 AM Leward Quan A wrote: Reason for CRM: Patient daughter Ramiro Harvest called in to inform Baxter Flattery that her mom Rx for escitalopram (LEXAPRO) 20 MG tablet need to be increased. This is because since her recent stroke her mood seem to be a bit off. Per Hillary the Rx have been the same for 20 yrs and think an increase would help the patient. Please call with questions or concerns at Ph# [phone]

## 2021-02-08 NOTE — Progress Notes (Unsigned)
Added bupropion

## 2021-02-10 DIAGNOSIS — F32A Depression, unspecified: Secondary | ICD-10-CM | POA: Diagnosis not present

## 2021-02-10 DIAGNOSIS — I6930 Unspecified sequelae of cerebral infarction: Secondary | ICD-10-CM | POA: Diagnosis not present

## 2021-02-10 DIAGNOSIS — D509 Iron deficiency anemia, unspecified: Secondary | ICD-10-CM | POA: Diagnosis not present

## 2021-02-10 DIAGNOSIS — N1831 Chronic kidney disease, stage 3a: Secondary | ICD-10-CM | POA: Diagnosis not present

## 2021-02-10 DIAGNOSIS — M6281 Muscle weakness (generalized): Secondary | ICD-10-CM | POA: Diagnosis not present

## 2021-02-10 DIAGNOSIS — I63521 Cerebral infarction due to unspecified occlusion or stenosis of right anterior cerebral artery: Secondary | ICD-10-CM | POA: Diagnosis not present

## 2021-02-10 DIAGNOSIS — E039 Hypothyroidism, unspecified: Secondary | ICD-10-CM | POA: Diagnosis not present

## 2021-02-10 DIAGNOSIS — R197 Diarrhea, unspecified: Secondary | ICD-10-CM | POA: Diagnosis not present

## 2021-02-10 DIAGNOSIS — Z736 Limitation of activities due to disability: Secondary | ICD-10-CM | POA: Diagnosis not present

## 2021-02-10 DIAGNOSIS — E785 Hyperlipidemia, unspecified: Secondary | ICD-10-CM | POA: Diagnosis not present

## 2021-02-10 DIAGNOSIS — J302 Other seasonal allergic rhinitis: Secondary | ICD-10-CM | POA: Diagnosis not present

## 2021-02-10 DIAGNOSIS — R631 Polydipsia: Secondary | ICD-10-CM | POA: Diagnosis not present

## 2021-02-10 DIAGNOSIS — E569 Vitamin deficiency, unspecified: Secondary | ICD-10-CM | POA: Diagnosis not present

## 2021-02-13 DIAGNOSIS — M6281 Muscle weakness (generalized): Secondary | ICD-10-CM | POA: Diagnosis not present

## 2021-02-13 DIAGNOSIS — Z736 Limitation of activities due to disability: Secondary | ICD-10-CM | POA: Diagnosis not present

## 2021-02-13 DIAGNOSIS — I6930 Unspecified sequelae of cerebral infarction: Secondary | ICD-10-CM | POA: Diagnosis not present

## 2021-02-13 DIAGNOSIS — E785 Hyperlipidemia, unspecified: Secondary | ICD-10-CM | POA: Diagnosis not present

## 2021-02-13 DIAGNOSIS — E569 Vitamin deficiency, unspecified: Secondary | ICD-10-CM | POA: Diagnosis not present

## 2021-02-13 DIAGNOSIS — R7309 Other abnormal glucose: Secondary | ICD-10-CM | POA: Diagnosis not present

## 2021-02-13 DIAGNOSIS — D509 Iron deficiency anemia, unspecified: Secondary | ICD-10-CM | POA: Diagnosis not present

## 2021-02-13 DIAGNOSIS — E039 Hypothyroidism, unspecified: Secondary | ICD-10-CM | POA: Diagnosis not present

## 2021-02-13 DIAGNOSIS — J302 Other seasonal allergic rhinitis: Secondary | ICD-10-CM | POA: Diagnosis not present

## 2021-02-13 DIAGNOSIS — I63521 Cerebral infarction due to unspecified occlusion or stenosis of right anterior cerebral artery: Secondary | ICD-10-CM | POA: Diagnosis not present

## 2021-02-13 DIAGNOSIS — F32A Depression, unspecified: Secondary | ICD-10-CM | POA: Diagnosis not present

## 2021-02-13 DIAGNOSIS — I10 Essential (primary) hypertension: Secondary | ICD-10-CM | POA: Diagnosis not present

## 2021-02-14 DIAGNOSIS — E785 Hyperlipidemia, unspecified: Secondary | ICD-10-CM | POA: Diagnosis not present

## 2021-02-14 DIAGNOSIS — D509 Iron deficiency anemia, unspecified: Secondary | ICD-10-CM | POA: Diagnosis not present

## 2021-02-14 DIAGNOSIS — I6930 Unspecified sequelae of cerebral infarction: Secondary | ICD-10-CM | POA: Diagnosis not present

## 2021-02-14 DIAGNOSIS — E039 Hypothyroidism, unspecified: Secondary | ICD-10-CM | POA: Diagnosis not present

## 2021-02-14 DIAGNOSIS — I63521 Cerebral infarction due to unspecified occlusion or stenosis of right anterior cerebral artery: Secondary | ICD-10-CM | POA: Diagnosis not present

## 2021-02-14 DIAGNOSIS — M6281 Muscle weakness (generalized): Secondary | ICD-10-CM | POA: Diagnosis not present

## 2021-02-14 DIAGNOSIS — E569 Vitamin deficiency, unspecified: Secondary | ICD-10-CM | POA: Diagnosis not present

## 2021-02-14 DIAGNOSIS — Z736 Limitation of activities due to disability: Secondary | ICD-10-CM | POA: Diagnosis not present

## 2021-02-14 DIAGNOSIS — J302 Other seasonal allergic rhinitis: Secondary | ICD-10-CM | POA: Diagnosis not present

## 2021-02-14 DIAGNOSIS — F32A Depression, unspecified: Secondary | ICD-10-CM | POA: Diagnosis not present

## 2021-02-14 NOTE — Telephone Encounter (Signed)
Patient daughter never returned to call to let us know if they want patient to take Wellbutrin or send to psych.   CM

## 2021-02-15 DIAGNOSIS — R143 Flatulence: Secondary | ICD-10-CM | POA: Diagnosis not present

## 2021-02-15 DIAGNOSIS — R197 Diarrhea, unspecified: Secondary | ICD-10-CM | POA: Diagnosis not present

## 2021-02-15 DIAGNOSIS — F32A Depression, unspecified: Secondary | ICD-10-CM | POA: Diagnosis not present

## 2021-02-15 DIAGNOSIS — D509 Iron deficiency anemia, unspecified: Secondary | ICD-10-CM | POA: Diagnosis not present

## 2021-02-15 DIAGNOSIS — Z736 Limitation of activities due to disability: Secondary | ICD-10-CM | POA: Diagnosis not present

## 2021-02-15 DIAGNOSIS — I63521 Cerebral infarction due to unspecified occlusion or stenosis of right anterior cerebral artery: Secondary | ICD-10-CM | POA: Diagnosis not present

## 2021-02-15 DIAGNOSIS — E569 Vitamin deficiency, unspecified: Secondary | ICD-10-CM | POA: Diagnosis not present

## 2021-02-15 DIAGNOSIS — E785 Hyperlipidemia, unspecified: Secondary | ICD-10-CM | POA: Diagnosis not present

## 2021-02-15 DIAGNOSIS — E039 Hypothyroidism, unspecified: Secondary | ICD-10-CM | POA: Diagnosis not present

## 2021-02-15 DIAGNOSIS — J302 Other seasonal allergic rhinitis: Secondary | ICD-10-CM | POA: Diagnosis not present

## 2021-02-15 DIAGNOSIS — I6930 Unspecified sequelae of cerebral infarction: Secondary | ICD-10-CM | POA: Diagnosis not present

## 2021-02-15 DIAGNOSIS — M6281 Muscle weakness (generalized): Secondary | ICD-10-CM | POA: Diagnosis not present

## 2021-02-16 DIAGNOSIS — N1831 Chronic kidney disease, stage 3a: Secondary | ICD-10-CM | POA: Diagnosis not present

## 2021-02-16 DIAGNOSIS — I63521 Cerebral infarction due to unspecified occlusion or stenosis of right anterior cerebral artery: Secondary | ICD-10-CM | POA: Diagnosis not present

## 2021-02-16 DIAGNOSIS — F32A Depression, unspecified: Secondary | ICD-10-CM | POA: Diagnosis not present

## 2021-02-16 DIAGNOSIS — N390 Urinary tract infection, site not specified: Secondary | ICD-10-CM | POA: Diagnosis not present

## 2021-02-16 DIAGNOSIS — Z736 Limitation of activities due to disability: Secondary | ICD-10-CM | POA: Diagnosis not present

## 2021-02-16 DIAGNOSIS — J302 Other seasonal allergic rhinitis: Secondary | ICD-10-CM | POA: Diagnosis not present

## 2021-02-16 DIAGNOSIS — E039 Hypothyroidism, unspecified: Secondary | ICD-10-CM | POA: Diagnosis not present

## 2021-02-16 DIAGNOSIS — E569 Vitamin deficiency, unspecified: Secondary | ICD-10-CM | POA: Diagnosis not present

## 2021-02-16 DIAGNOSIS — I6930 Unspecified sequelae of cerebral infarction: Secondary | ICD-10-CM | POA: Diagnosis not present

## 2021-02-16 DIAGNOSIS — E785 Hyperlipidemia, unspecified: Secondary | ICD-10-CM | POA: Diagnosis not present

## 2021-02-16 DIAGNOSIS — D509 Iron deficiency anemia, unspecified: Secondary | ICD-10-CM | POA: Diagnosis not present

## 2021-02-16 DIAGNOSIS — M6281 Muscle weakness (generalized): Secondary | ICD-10-CM | POA: Diagnosis not present

## 2021-02-16 DIAGNOSIS — I10 Essential (primary) hypertension: Secondary | ICD-10-CM | POA: Diagnosis not present

## 2021-02-17 DIAGNOSIS — E785 Hyperlipidemia, unspecified: Secondary | ICD-10-CM | POA: Diagnosis not present

## 2021-02-17 DIAGNOSIS — E569 Vitamin deficiency, unspecified: Secondary | ICD-10-CM | POA: Diagnosis not present

## 2021-02-17 DIAGNOSIS — I63521 Cerebral infarction due to unspecified occlusion or stenosis of right anterior cerebral artery: Secondary | ICD-10-CM | POA: Diagnosis not present

## 2021-02-17 DIAGNOSIS — D509 Iron deficiency anemia, unspecified: Secondary | ICD-10-CM | POA: Diagnosis not present

## 2021-02-17 DIAGNOSIS — J302 Other seasonal allergic rhinitis: Secondary | ICD-10-CM | POA: Diagnosis not present

## 2021-02-17 DIAGNOSIS — F32A Depression, unspecified: Secondary | ICD-10-CM | POA: Diagnosis not present

## 2021-02-17 DIAGNOSIS — E039 Hypothyroidism, unspecified: Secondary | ICD-10-CM | POA: Diagnosis not present

## 2021-02-17 DIAGNOSIS — I6930 Unspecified sequelae of cerebral infarction: Secondary | ICD-10-CM | POA: Diagnosis not present

## 2021-02-17 DIAGNOSIS — M6281 Muscle weakness (generalized): Secondary | ICD-10-CM | POA: Diagnosis not present

## 2021-02-17 DIAGNOSIS — Z736 Limitation of activities due to disability: Secondary | ICD-10-CM | POA: Diagnosis not present

## 2021-02-20 DIAGNOSIS — E039 Hypothyroidism, unspecified: Secondary | ICD-10-CM | POA: Diagnosis not present

## 2021-02-20 DIAGNOSIS — J302 Other seasonal allergic rhinitis: Secondary | ICD-10-CM | POA: Diagnosis not present

## 2021-02-20 DIAGNOSIS — I6930 Unspecified sequelae of cerebral infarction: Secondary | ICD-10-CM | POA: Diagnosis not present

## 2021-02-20 DIAGNOSIS — F32A Depression, unspecified: Secondary | ICD-10-CM | POA: Diagnosis not present

## 2021-02-20 DIAGNOSIS — D509 Iron deficiency anemia, unspecified: Secondary | ICD-10-CM | POA: Diagnosis not present

## 2021-02-20 DIAGNOSIS — E569 Vitamin deficiency, unspecified: Secondary | ICD-10-CM | POA: Diagnosis not present

## 2021-02-20 DIAGNOSIS — I63521 Cerebral infarction due to unspecified occlusion or stenosis of right anterior cerebral artery: Secondary | ICD-10-CM | POA: Diagnosis not present

## 2021-02-20 DIAGNOSIS — E785 Hyperlipidemia, unspecified: Secondary | ICD-10-CM | POA: Diagnosis not present

## 2021-02-20 DIAGNOSIS — M6281 Muscle weakness (generalized): Secondary | ICD-10-CM | POA: Diagnosis not present

## 2021-02-20 DIAGNOSIS — Z736 Limitation of activities due to disability: Secondary | ICD-10-CM | POA: Diagnosis not present

## 2021-02-21 ENCOUNTER — Encounter: Payer: Medicare Other | Attending: Physical Medicine & Rehabilitation | Admitting: Physical Medicine & Rehabilitation

## 2021-02-21 DIAGNOSIS — E569 Vitamin deficiency, unspecified: Secondary | ICD-10-CM | POA: Diagnosis not present

## 2021-02-21 DIAGNOSIS — I63521 Cerebral infarction due to unspecified occlusion or stenosis of right anterior cerebral artery: Secondary | ICD-10-CM | POA: Diagnosis not present

## 2021-02-21 DIAGNOSIS — D509 Iron deficiency anemia, unspecified: Secondary | ICD-10-CM | POA: Diagnosis not present

## 2021-02-21 DIAGNOSIS — F32A Depression, unspecified: Secondary | ICD-10-CM | POA: Diagnosis not present

## 2021-02-21 DIAGNOSIS — J302 Other seasonal allergic rhinitis: Secondary | ICD-10-CM | POA: Diagnosis not present

## 2021-02-21 DIAGNOSIS — E785 Hyperlipidemia, unspecified: Secondary | ICD-10-CM | POA: Diagnosis not present

## 2021-02-21 DIAGNOSIS — I6930 Unspecified sequelae of cerebral infarction: Secondary | ICD-10-CM | POA: Diagnosis not present

## 2021-02-21 DIAGNOSIS — E039 Hypothyroidism, unspecified: Secondary | ICD-10-CM | POA: Diagnosis not present

## 2021-02-21 DIAGNOSIS — M6281 Muscle weakness (generalized): Secondary | ICD-10-CM | POA: Diagnosis not present

## 2021-02-21 DIAGNOSIS — Z736 Limitation of activities due to disability: Secondary | ICD-10-CM | POA: Diagnosis not present

## 2021-02-22 ENCOUNTER — Telehealth: Payer: Self-pay

## 2021-02-22 ENCOUNTER — Other Ambulatory Visit: Payer: Self-pay

## 2021-02-22 ENCOUNTER — Emergency Department: Payer: Medicare Other

## 2021-02-22 ENCOUNTER — Emergency Department
Admission: EM | Admit: 2021-02-22 | Discharge: 2021-02-23 | Disposition: A | Discharge status: Home / Self Care | Payer: Medicare Other | Attending: Emergency Medicine | Admitting: Emergency Medicine

## 2021-02-22 DIAGNOSIS — Y92199 Unspecified place in other specified residential institution as the place of occurrence of the external cause: Secondary | ICD-10-CM | POA: Diagnosis not present

## 2021-02-22 DIAGNOSIS — N183 Chronic kidney disease, stage 3 unspecified: Secondary | ICD-10-CM | POA: Insufficient documentation

## 2021-02-22 DIAGNOSIS — W19XXXA Unspecified fall, initial encounter: Secondary | ICD-10-CM | POA: Diagnosis not present

## 2021-02-22 DIAGNOSIS — S99911A Unspecified injury of right ankle, initial encounter: Secondary | ICD-10-CM | POA: Diagnosis present

## 2021-02-22 DIAGNOSIS — Z7901 Long term (current) use of anticoagulants: Secondary | ICD-10-CM | POA: Insufficient documentation

## 2021-02-22 DIAGNOSIS — D509 Iron deficiency anemia, unspecified: Secondary | ICD-10-CM | POA: Diagnosis not present

## 2021-02-22 DIAGNOSIS — M6281 Muscle weakness (generalized): Secondary | ICD-10-CM | POA: Diagnosis not present

## 2021-02-22 DIAGNOSIS — M47812 Spondylosis without myelopathy or radiculopathy, cervical region: Secondary | ICD-10-CM | POA: Diagnosis not present

## 2021-02-22 DIAGNOSIS — I129 Hypertensive chronic kidney disease with stage 1 through stage 4 chronic kidney disease, or unspecified chronic kidney disease: Secondary | ICD-10-CM | POA: Insufficient documentation

## 2021-02-22 DIAGNOSIS — E785 Hyperlipidemia, unspecified: Secondary | ICD-10-CM | POA: Diagnosis not present

## 2021-02-22 DIAGNOSIS — Z736 Limitation of activities due to disability: Secondary | ICD-10-CM | POA: Diagnosis not present

## 2021-02-22 DIAGNOSIS — J302 Other seasonal allergic rhinitis: Secondary | ICD-10-CM | POA: Diagnosis not present

## 2021-02-22 DIAGNOSIS — I6389 Other cerebral infarction: Secondary | ICD-10-CM | POA: Diagnosis not present

## 2021-02-22 DIAGNOSIS — E039 Hypothyroidism, unspecified: Secondary | ICD-10-CM | POA: Insufficient documentation

## 2021-02-22 DIAGNOSIS — M79671 Pain in right foot: Secondary | ICD-10-CM | POA: Diagnosis not present

## 2021-02-22 DIAGNOSIS — I6930 Unspecified sequelae of cerebral infarction: Secondary | ICD-10-CM | POA: Diagnosis not present

## 2021-02-22 DIAGNOSIS — Z8673 Personal history of transient ischemic attack (TIA), and cerebral infarction without residual deficits: Secondary | ICD-10-CM | POA: Diagnosis not present

## 2021-02-22 DIAGNOSIS — W1811XA Fall from or off toilet without subsequent striking against object, initial encounter: Secondary | ICD-10-CM | POA: Insufficient documentation

## 2021-02-22 DIAGNOSIS — S82851A Displaced trimalleolar fracture of right lower leg, initial encounter for closed fracture: Secondary | ICD-10-CM | POA: Diagnosis not present

## 2021-02-22 DIAGNOSIS — M25571 Pain in right ankle and joints of right foot: Secondary | ICD-10-CM | POA: Insufficient documentation

## 2021-02-22 DIAGNOSIS — M7989 Other specified soft tissue disorders: Secondary | ICD-10-CM | POA: Diagnosis not present

## 2021-02-22 DIAGNOSIS — F32A Depression, unspecified: Secondary | ICD-10-CM | POA: Diagnosis not present

## 2021-02-22 DIAGNOSIS — S0990XA Unspecified injury of head, initial encounter: Secondary | ICD-10-CM | POA: Diagnosis not present

## 2021-02-22 DIAGNOSIS — S82854A Nondisplaced trimalleolar fracture of right lower leg, initial encounter for closed fracture: Secondary | ICD-10-CM | POA: Insufficient documentation

## 2021-02-22 DIAGNOSIS — J984 Other disorders of lung: Secondary | ICD-10-CM | POA: Diagnosis not present

## 2021-02-22 DIAGNOSIS — E569 Vitamin deficiency, unspecified: Secondary | ICD-10-CM | POA: Diagnosis not present

## 2021-02-22 DIAGNOSIS — Z79899 Other long term (current) drug therapy: Secondary | ICD-10-CM | POA: Insufficient documentation

## 2021-02-22 DIAGNOSIS — I63521 Cerebral infarction due to unspecified occlusion or stenosis of right anterior cerebral artery: Secondary | ICD-10-CM | POA: Diagnosis not present

## 2021-02-22 DIAGNOSIS — Z743 Need for continuous supervision: Secondary | ICD-10-CM | POA: Diagnosis not present

## 2021-02-22 DIAGNOSIS — G319 Degenerative disease of nervous system, unspecified: Secondary | ICD-10-CM | POA: Diagnosis not present

## 2021-02-22 DIAGNOSIS — G9389 Other specified disorders of brain: Secondary | ICD-10-CM | POA: Diagnosis not present

## 2021-02-22 DIAGNOSIS — R609 Edema, unspecified: Secondary | ICD-10-CM | POA: Diagnosis not present

## 2021-02-22 LAB — CBC WITH DIFFERENTIAL/PLATELET
Abs Immature Granulocytes: 0.04 10*3/uL (ref 0.00–0.07)
Basophils Absolute: 0 10*3/uL (ref 0.0–0.1)
Basophils Relative: 1 %
Eosinophils Absolute: 0.1 10*3/uL (ref 0.0–0.5)
Eosinophils Relative: 2 %
HCT: 26.6 % — ABNORMAL LOW (ref 36.0–46.0)
Hemoglobin: 8.7 g/dL — ABNORMAL LOW (ref 12.0–15.0)
Immature Granulocytes: 1 %
Lymphocytes Relative: 16 %
Lymphs Abs: 1.1 10*3/uL (ref 0.7–4.0)
MCH: 32.8 pg (ref 26.0–34.0)
MCHC: 32.7 g/dL (ref 30.0–36.0)
MCV: 100.4 fL — ABNORMAL HIGH (ref 80.0–100.0)
Monocytes Absolute: 0.5 10*3/uL (ref 0.1–1.0)
Monocytes Relative: 8 %
Neutro Abs: 5 10*3/uL (ref 1.7–7.7)
Neutrophils Relative %: 72 %
Platelets: 82 10*3/uL — ABNORMAL LOW (ref 150–400)
RBC: 2.65 MIL/uL — ABNORMAL LOW (ref 3.87–5.11)
RDW: 13 % (ref 11.5–15.5)
WBC: 6.8 10*3/uL (ref 4.0–10.5)
nRBC: 0 % (ref 0.0–0.2)

## 2021-02-22 LAB — PROTIME-INR
INR: 2 — ABNORMAL HIGH (ref 0.8–1.2)
Prothrombin Time: 21.5 seconds — ABNORMAL HIGH (ref 11.4–15.2)

## 2021-02-22 LAB — COMPREHENSIVE METABOLIC PANEL
ALT: 9 U/L (ref 0–44)
AST: 31 U/L (ref 15–41)
Albumin: 3.3 g/dL — ABNORMAL LOW (ref 3.5–5.0)
Alkaline Phosphatase: 76 U/L (ref 38–126)
Anion gap: 10 (ref 5–15)
BUN: 27 mg/dL — ABNORMAL HIGH (ref 8–23)
CO2: 20 mmol/L — ABNORMAL LOW (ref 22–32)
Calcium: 9.4 mg/dL (ref 8.9–10.3)
Chloride: 105 mmol/L (ref 98–111)
Creatinine, Ser: 1.8 mg/dL — ABNORMAL HIGH (ref 0.44–1.00)
GFR, Estimated: 30 mL/min — ABNORMAL LOW (ref >60)
Glucose, Bld: 106 mg/dL — ABNORMAL HIGH (ref 70–99)
Potassium: 4.4 mmol/L (ref 3.5–5.1)
Sodium: 135 mmol/L (ref 135–145)
Total Bilirubin: 1.1 mg/dL (ref 0.3–1.2)
Total Protein: 7.1 g/dL (ref 6.5–8.1)

## 2021-02-22 LAB — APTT: aPTT: 147 seconds — ABNORMAL HIGH (ref 24–36)

## 2021-02-22 IMAGING — CT CT HEAD W/O CM
3 series · 15 of 47 positions shown, 18 images · non-contrast
Comparison: None.

CLINICAL DATA: Fell.

EXAM:
CT HEAD WITHOUT CONTRAST
CT CERVICAL SPINE WITHOUT CONTRAST
TECHNIQUE: Multidetector CT imaging of the head and cervical spine was
performed following the standard protocol without intravenous
contrast. Multiplanar CT image reconstructions of the cervical spine
were also generated.

[Series 2: head wo · axial · 0.45mm/px · z∈[-138,-3]mm · 9 of 33 slices shown, 12 images]
[im 3/33  brain]
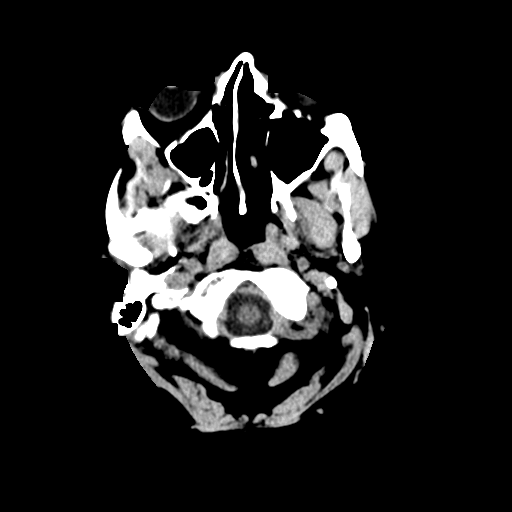
[im 3/33  bone]
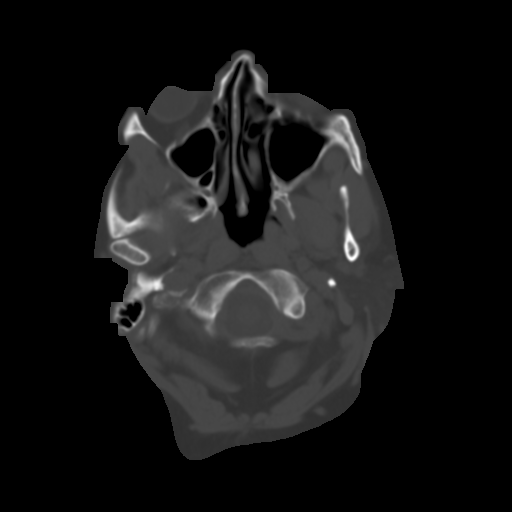
[im 6/33  brain]
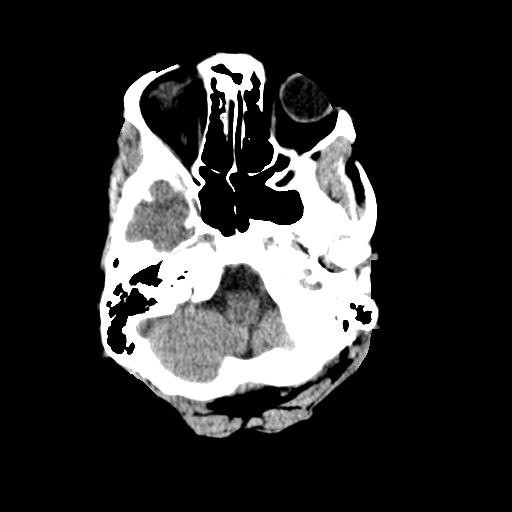
[im 9/33  brain]
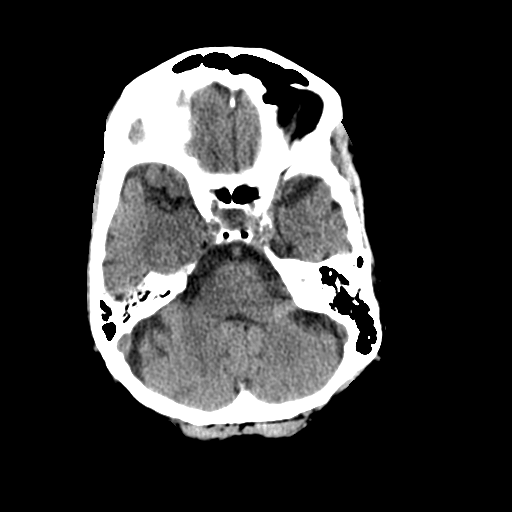
[im 13/33  brain]
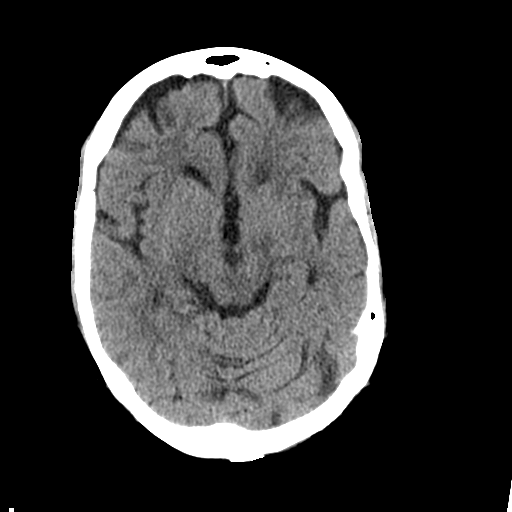
[im 17/33  brain]
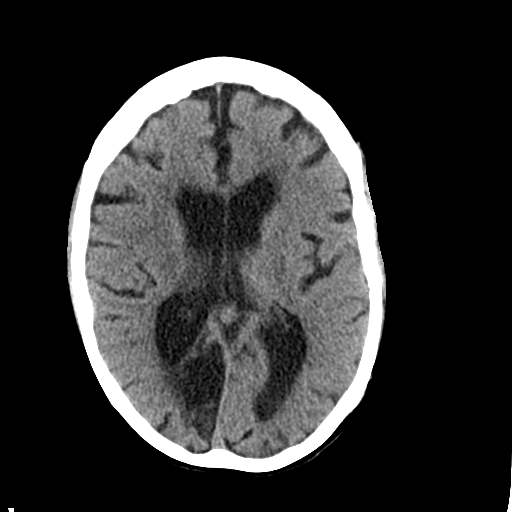
[im 17/33  bone]
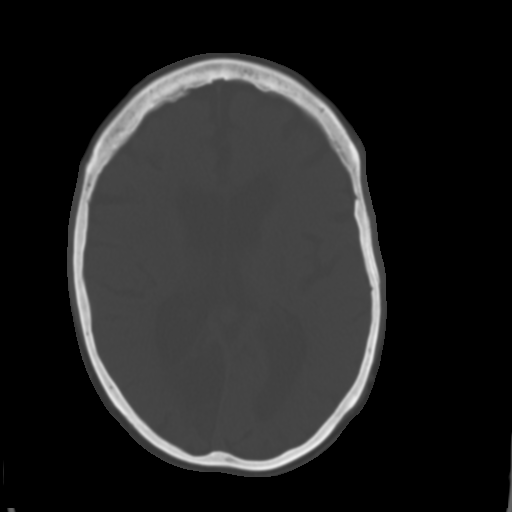
[im 20/33  brain]
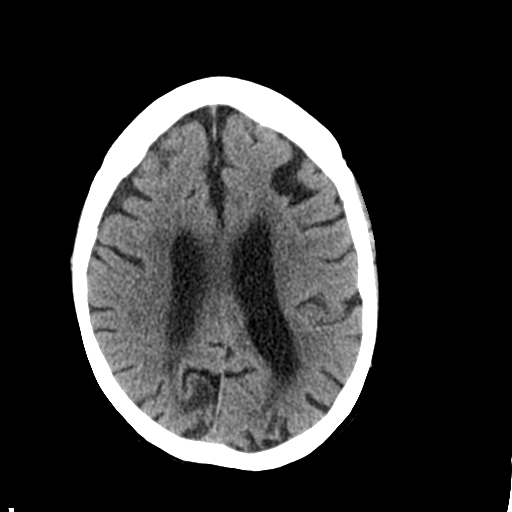
[im 24/33  brain]
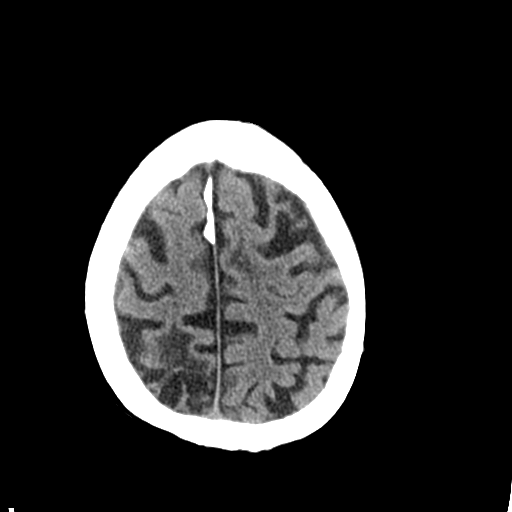
[im 27/33  brain]
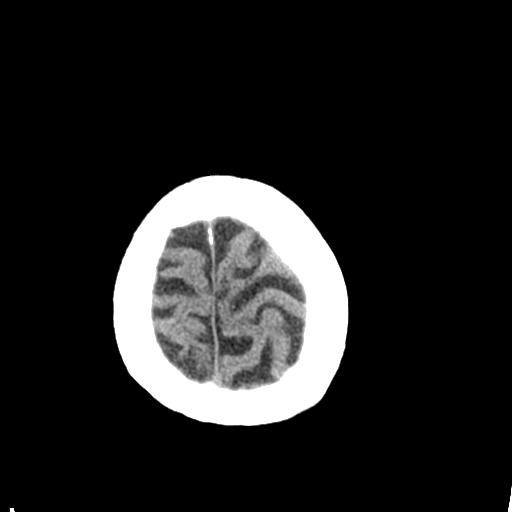
[im 30/33  brain]
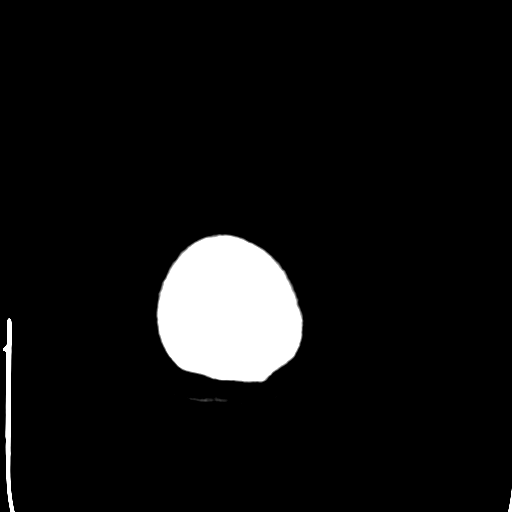
[im 30/33  bone]
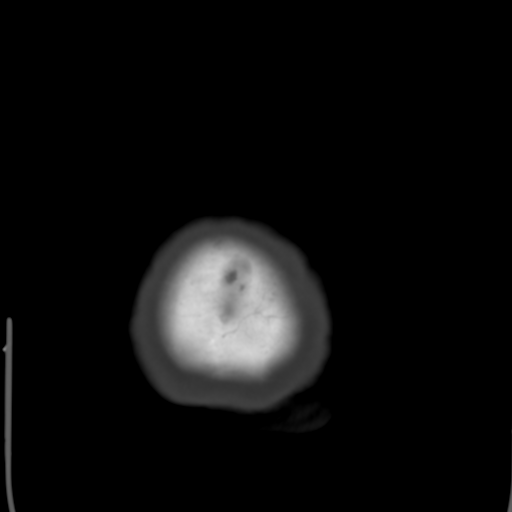

[Series 4: coronal soft tissue · coronal · 0.33mm/px · 3 of 66 slices shown]
[im 22/66  brain]
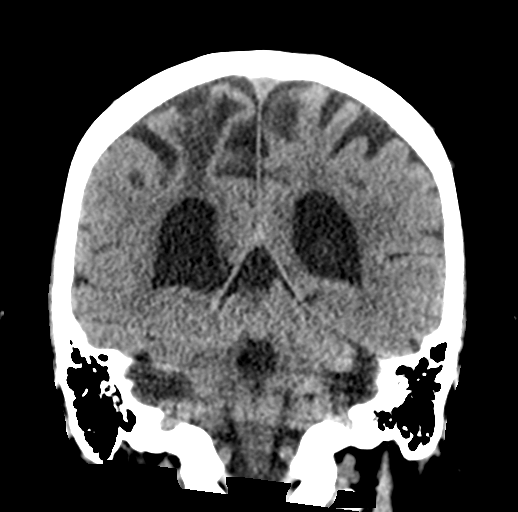
[im 29/66  brain]
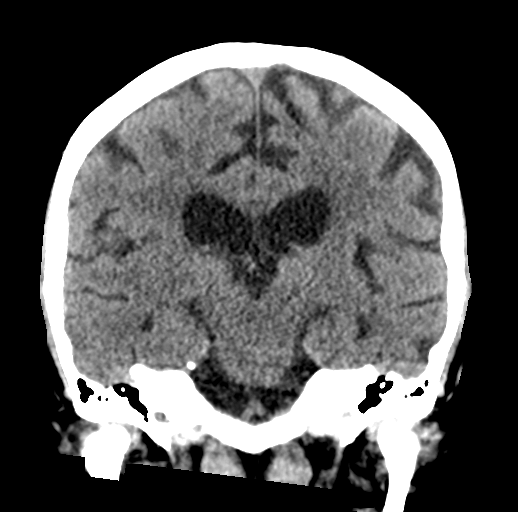
[im 37/66  brain]
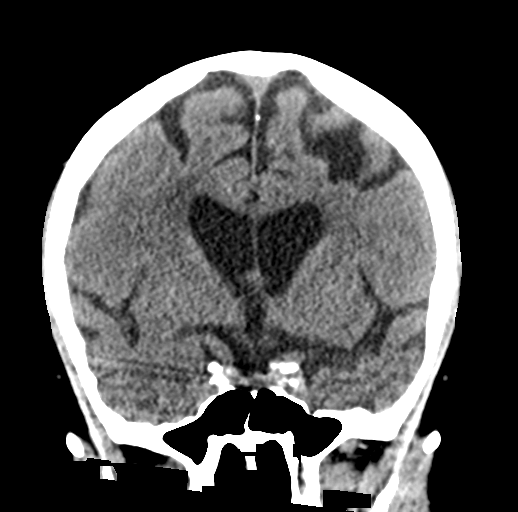

[Series 5: sagittal soft tissue · sagittal · 0.33mm/px · 3 of 57 slices shown]
[im 20/57  brain]
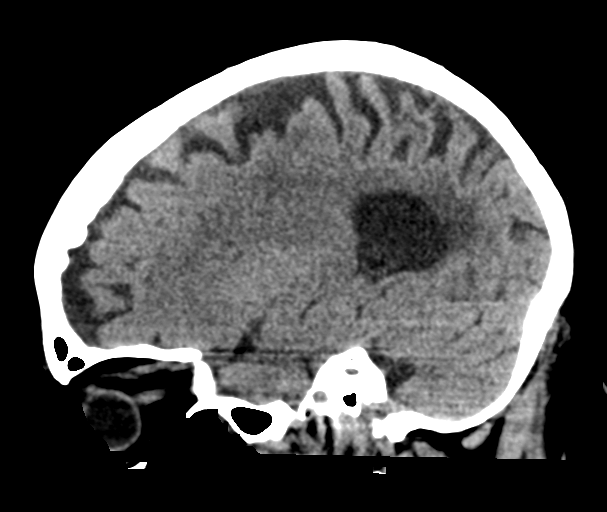
[im 29/57  brain]
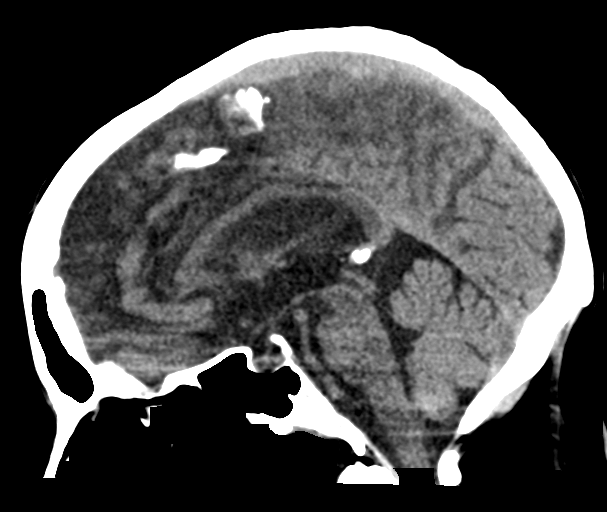
[im 37/57  brain]
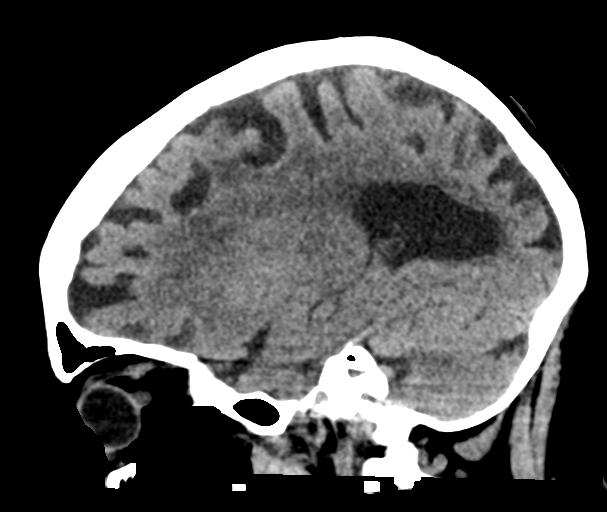

[15 of 47 positions shown; findings below may reference images not displayed]

FINDINGS: CT HEAD FINDINGS

Brain: Age advanced cerebral atrophy and associated
ventriculomegaly. Evidence of multiple prior infarcts most notably
in the right occipital lobe with associated encephalomalacia and ex
vacuo dilatation of the occipital horn of the right lateral
ventricle. I do not see any findings suggestive of an acute
hemispheric infarction. No intracranial hemorrhage. No extra-axial
fluid collections.

Vascular: Vascular calcifications but no definite aneurysm or
hyperdense vessels.

Skull: No skull fracture or bone lesions.

Sinuses/Orbits: The paranasal sinuses and mastoid air cells are
clear. The globes are intact.

Other: No scalp lesions or scalp hematoma.

CT CERVICAL SPINE FINDINGS

Alignment: Normal overall alignment of the cervical vertebral
bodies.

Skull base and vertebrae: No acute fracture. No primary bone lesion
or focal pathologic process.

Soft tissues and spinal canal: No prevertebral fluid or swelling. No
visible canal hematoma.

Disc levels: Degenerative cervical spondylosis with disc disease and
facet disease most notable at C4-5 and C5-6. No large disc
protrusions or significant spinal stenosis. Uncinate spurring and
facet disease but no significant foraminal stenosis.

Upper chest: The visualized lung apices are grossly clear.

Other: No neck mass, adenopathy or hematoma.
IMPRESSION: 1. Age advanced cerebral atrophy and associated ventriculomegaly.
2. Evidence of multiple prior infarcts.
3. No acute intracranial findings or skull fracture.
4. Degenerative cervical spondylosis with disc disease and facet
disease but no acute cervical spine fracture.

## 2021-02-22 IMAGING — CT CT CERVICAL SPINE W/O CM
3 of 4 series · 12 of 35 positions shown, 14 images · non-contrast
Comparison: None.

CLINICAL DATA: Fell.

EXAM:
CT HEAD WITHOUT CONTRAST
CT CERVICAL SPINE WITHOUT CONTRAST
TECHNIQUE: Multidetector CT imaging of the head and cervical spine was
performed following the standard protocol without intravenous
contrast. Multiplanar CT image reconstructions of the cervical spine
were also generated.

[Series 4: sagittal bone · sagittal · 0.27mm/px · 5 of 82 slices shown, 6 images]
[im 28/82  bone]
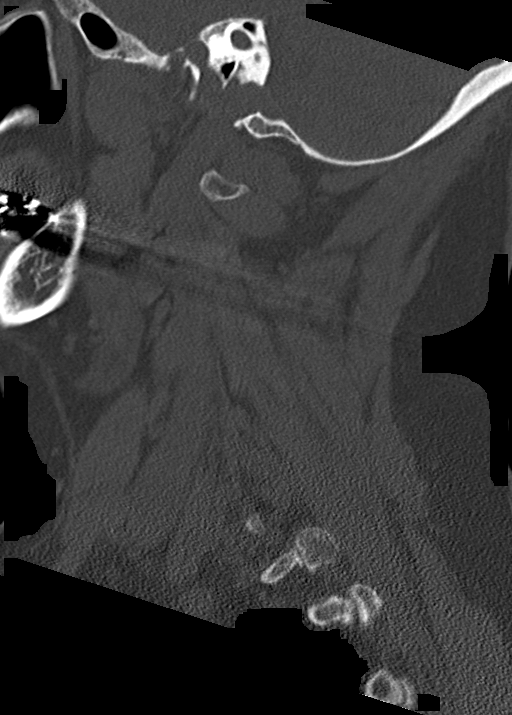
[im 34/82  bone]
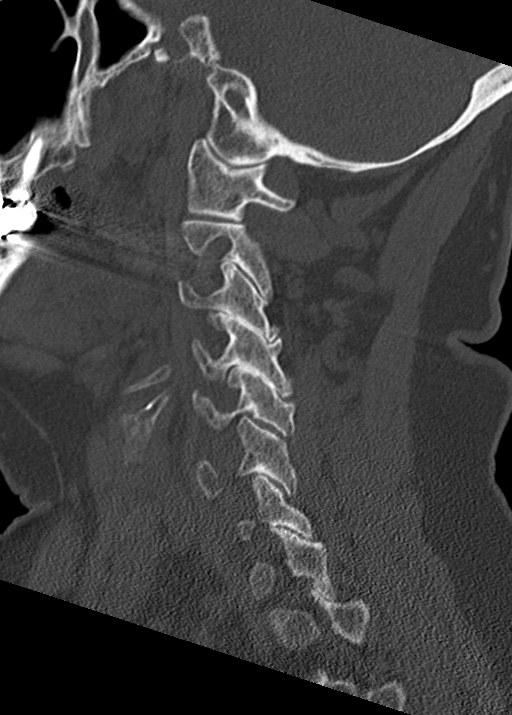
[im 41/82  soft-tissue]
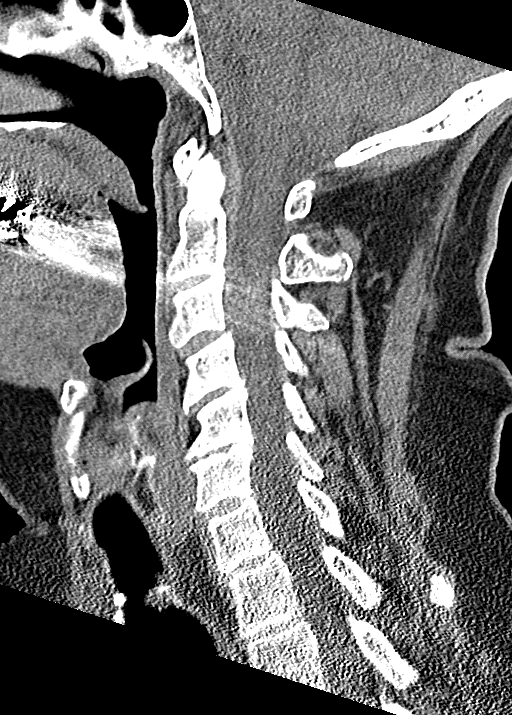
[im 41/82  bone]
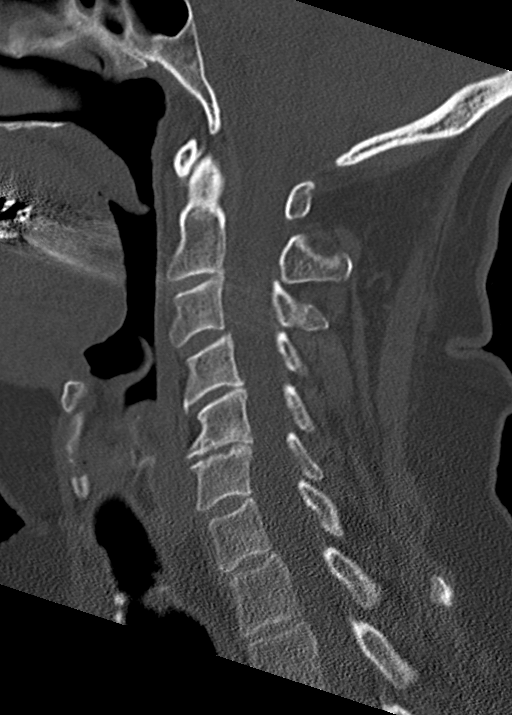
[im 48/82  bone]
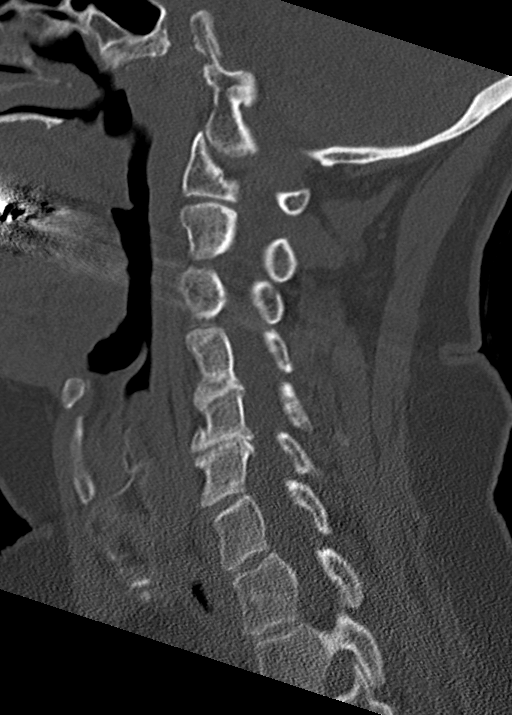
[im 55/82  bone]
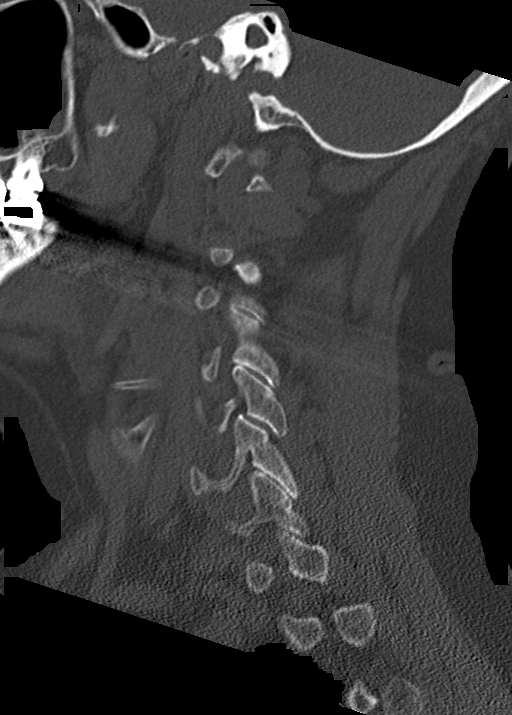

[Series 5: coronal bone · coronal · 0.30mm/px · 3 of 57 slices shown]
[im 12/57  bone]
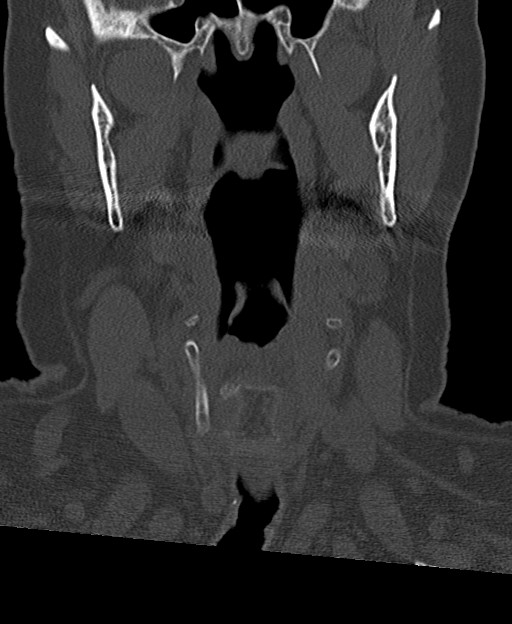
[im 23/57  bone]
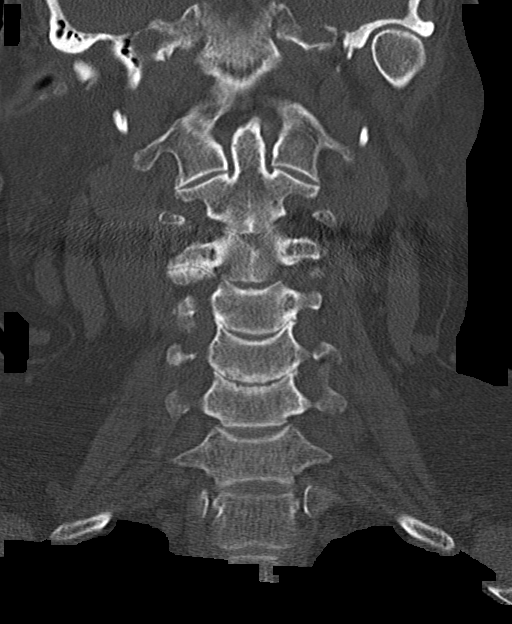
[im 34/57  bone]
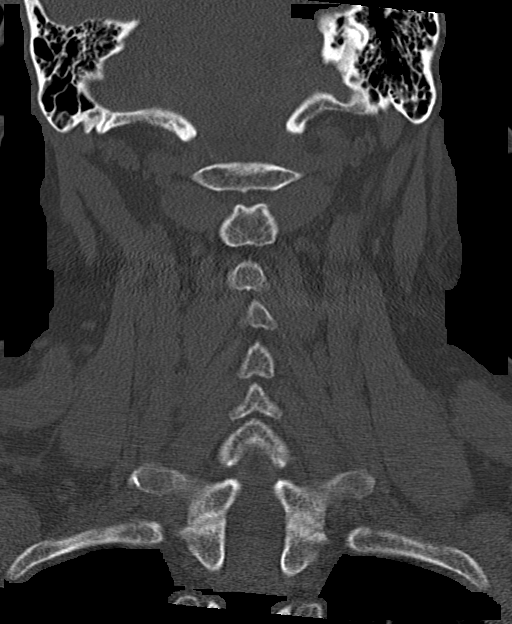

[Series 6: orthogonal bone · axial · 0.26mm/px · z∈[-300,-183]mm · 4 of 95 slices shown, 5 images]
[im 16/95  soft-tissue]
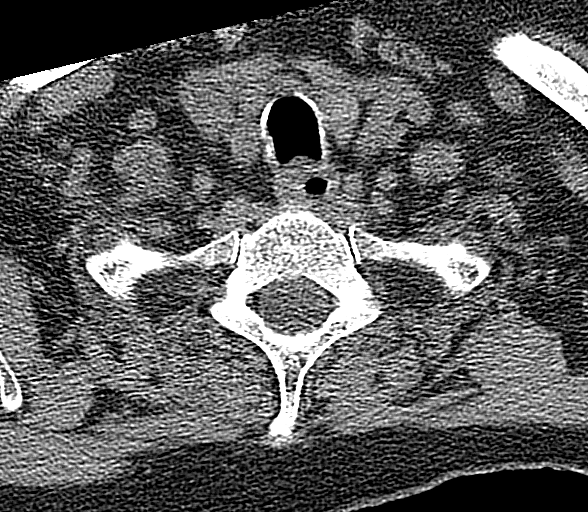
[im 16/95  bone]
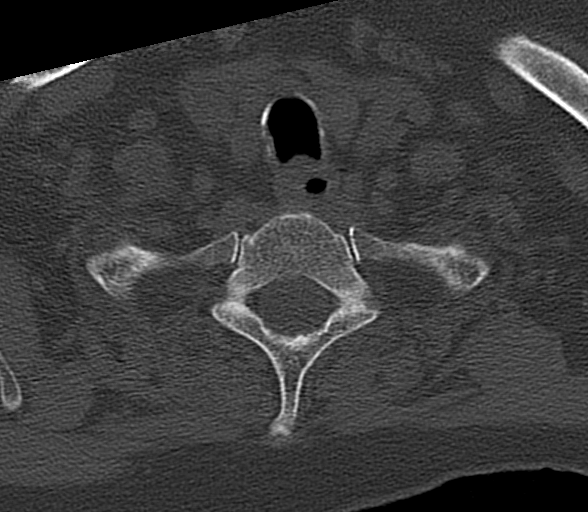
[im 32/95  bone]
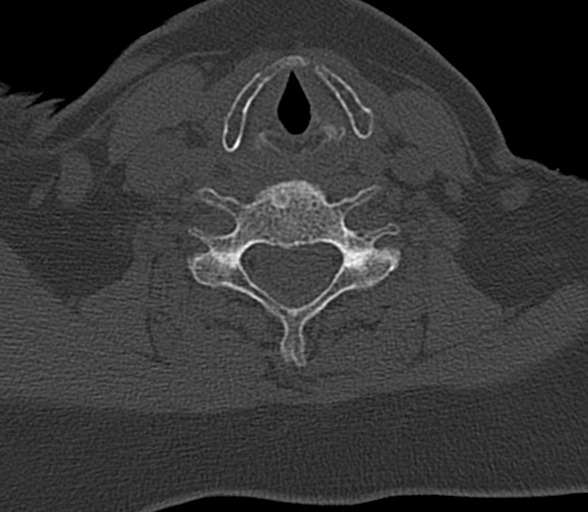
[im 63/95  bone]
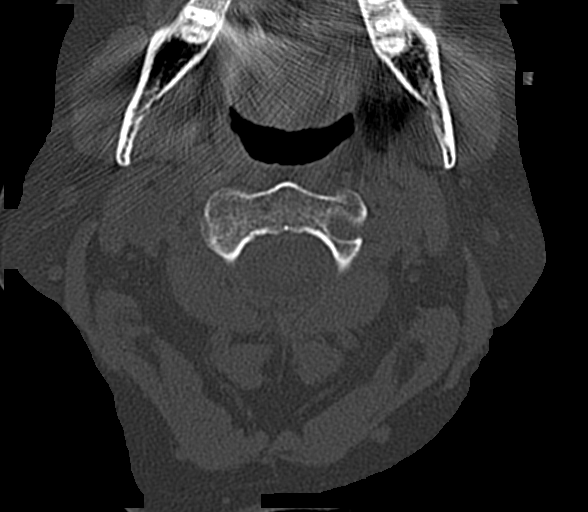
[im 79/95  bone]
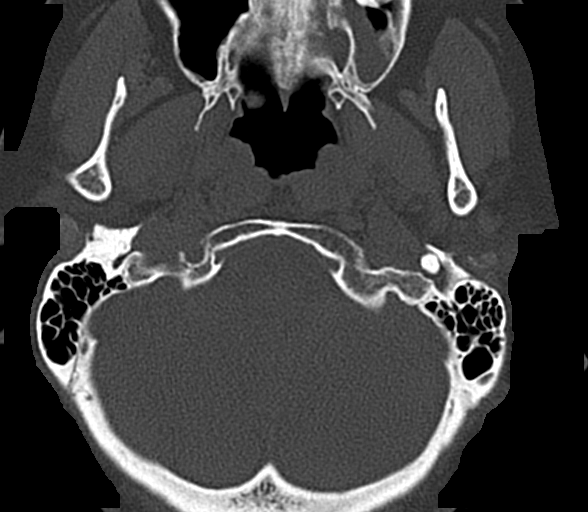

[12 of 35 positions shown; findings below may reference images not displayed]

FINDINGS: CT HEAD FINDINGS

Brain: Age advanced cerebral atrophy and associated
ventriculomegaly. Evidence of multiple prior infarcts most notably
in the right occipital lobe with associated encephalomalacia and ex
vacuo dilatation of the occipital horn of the right lateral
ventricle. I do not see any findings suggestive of an acute
hemispheric infarction. No intracranial hemorrhage. No extra-axial
fluid collections.

Vascular: Vascular calcifications but no definite aneurysm or
hyperdense vessels.

Skull: No skull fracture or bone lesions.

Sinuses/Orbits: The paranasal sinuses and mastoid air cells are
clear. The globes are intact.

Other: No scalp lesions or scalp hematoma.

CT CERVICAL SPINE FINDINGS

Alignment: Normal overall alignment of the cervical vertebral
bodies.

Skull base and vertebrae: No acute fracture. No primary bone lesion
or focal pathologic process.

Soft tissues and spinal canal: No prevertebral fluid or swelling. No
visible canal hematoma.

Disc levels: Degenerative cervical spondylosis with disc disease and
facet disease most notable at C4-5 and C5-6. No large disc
protrusions or significant spinal stenosis. Uncinate spurring and
facet disease but no significant foraminal stenosis.

Upper chest: The visualized lung apices are grossly clear.

Other: No neck mass, adenopathy or hematoma.
IMPRESSION: 1. Age advanced cerebral atrophy and associated ventriculomegaly.
2. Evidence of multiple prior infarcts.
3. No acute intracranial findings or skull fracture.
4. Degenerative cervical spondylosis with disc disease and facet
disease but no acute cervical spine fracture.

## 2021-02-22 IMAGING — DX DG ANKLE COMPLETE 3+V*R*
3 series · 3 of 3 positions shown · non-contrast
Comparison: [DATE]

CLINICAL DATA: Right ankle pain, fell

EXAM:
RIGHT ANKLE - COMPLETE 3+ VIEW

[ankle ap]
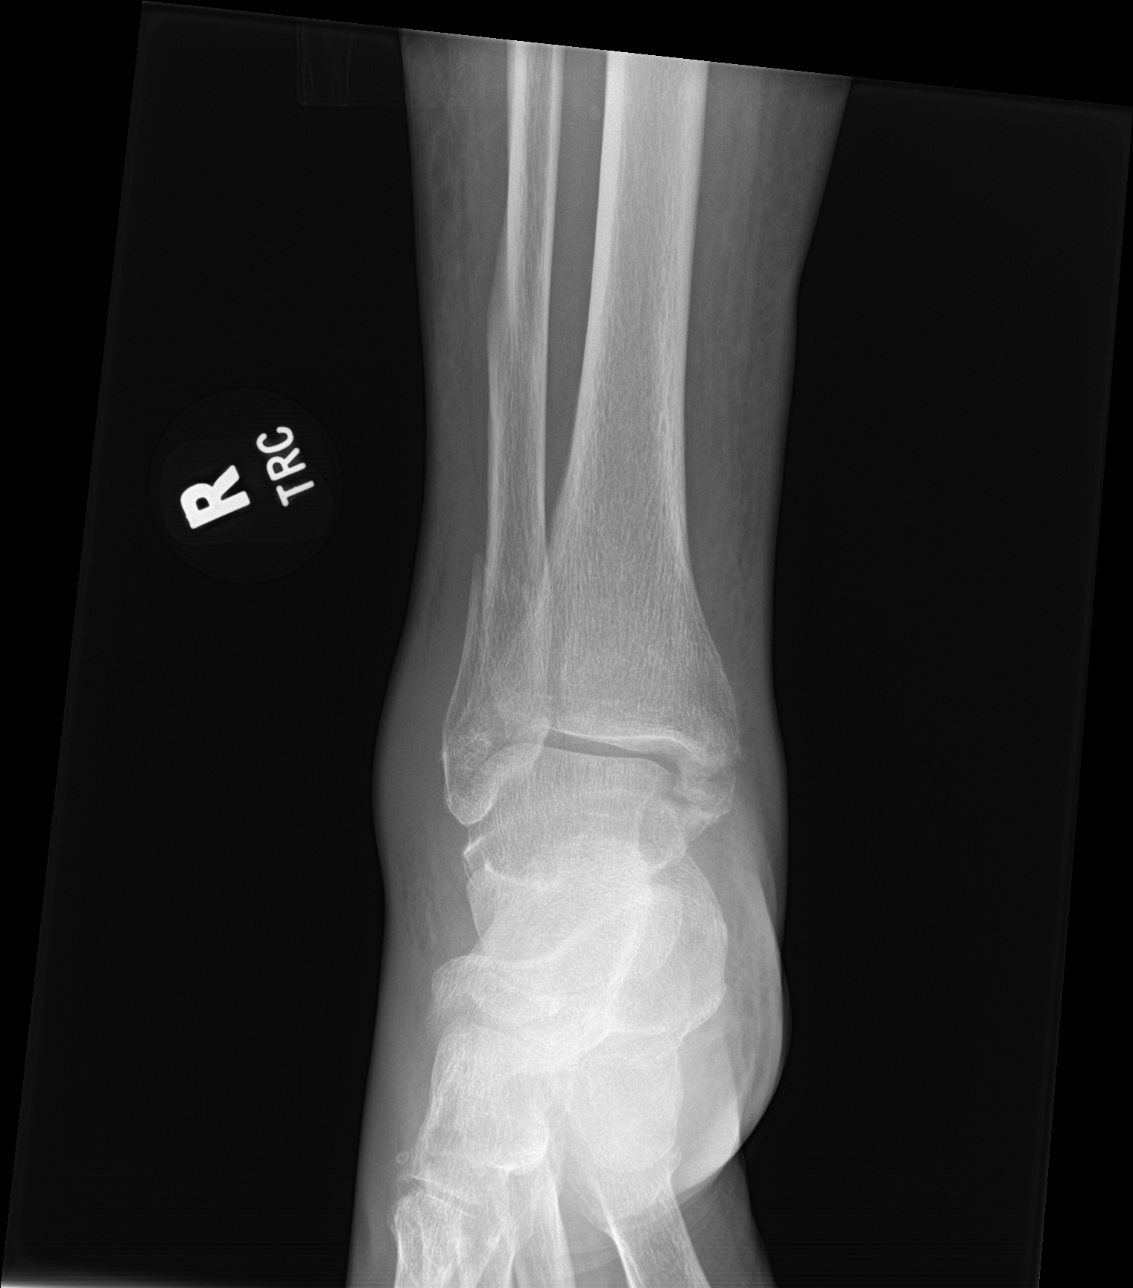

[ankle obl]
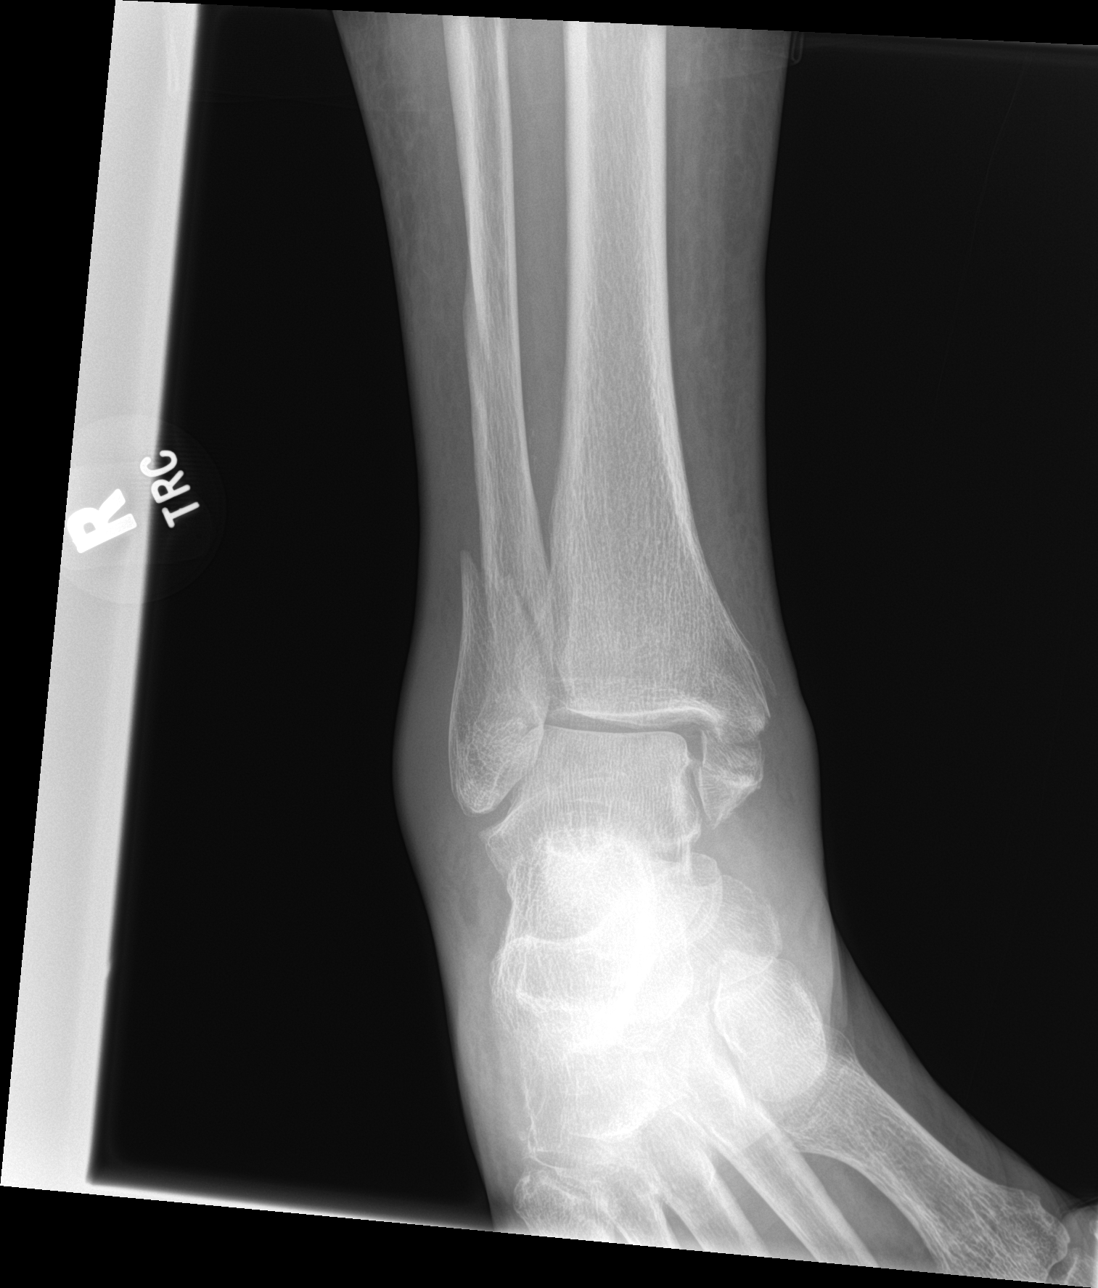

[ankle lat]
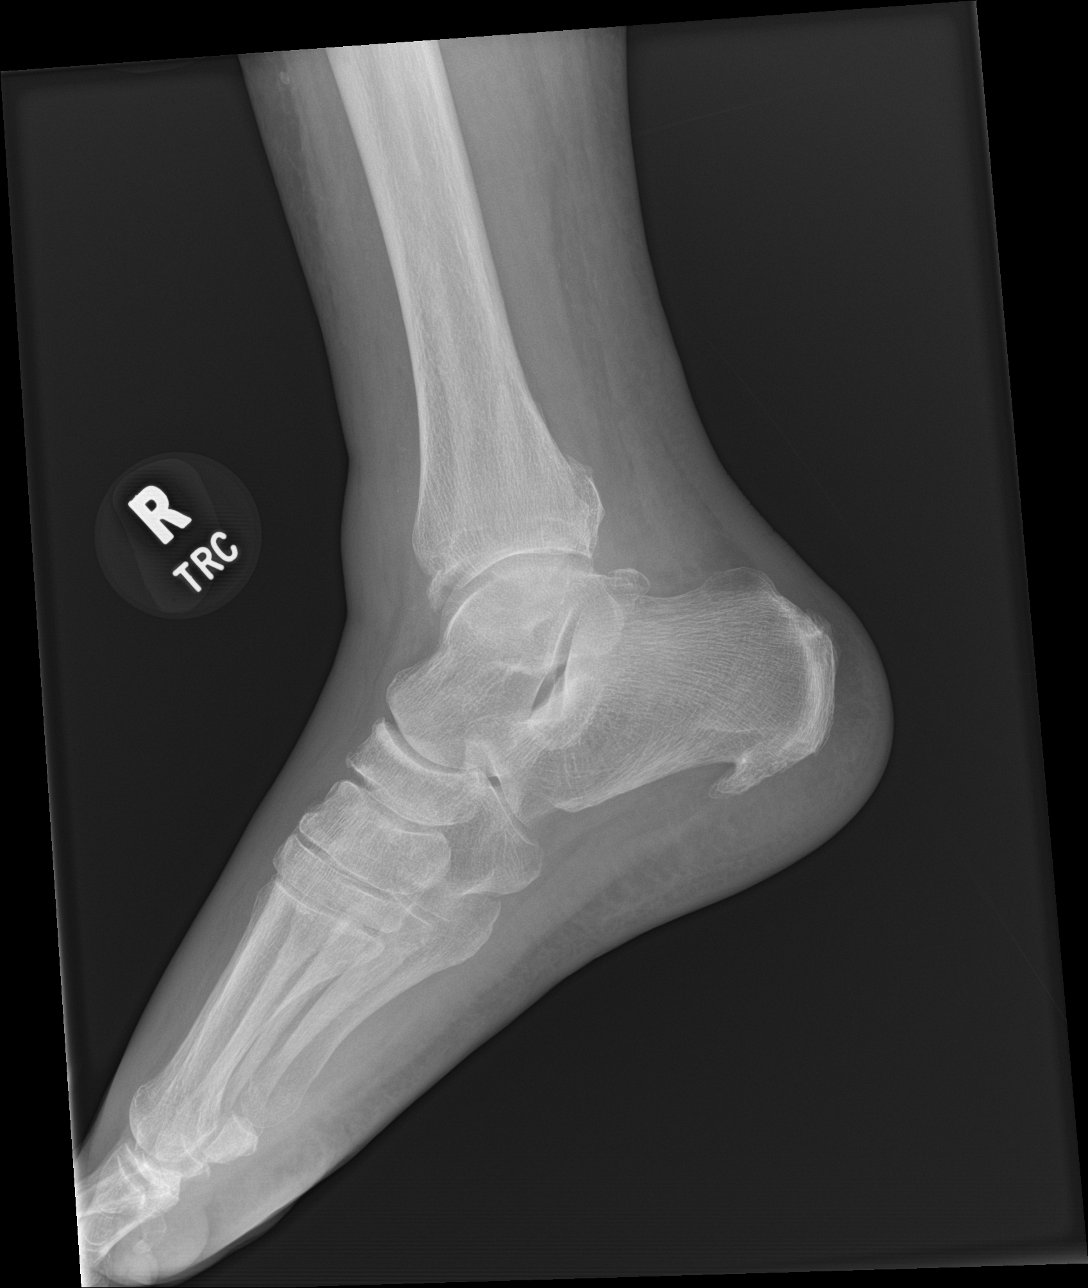

[3 of 3 positions shown; findings below may reference images not displayed]

FINDINGS: Frontal, oblique, lateral views of the right ankle are obtained.
There is an oblique displaced lateral malleolar fracture, with
lateral subluxation of the talus relative to the tibial plafond.

A minimally comminuted transverse medial malleolar fracture is also
noted, with mild distraction. The minimally displaced fracture
through the posterior malleolus is noted on lateral view.

There is diffuse soft tissue swelling. Large inferior calcaneal
spur.
IMPRESSION: 1. Trimalleolar right ankle fracture, with lateral subluxation of
the talus.
2. Diffuse soft tissue swelling.

## 2021-02-22 MED ORDER — HYDROCODONE-ACETAMINOPHEN 5-325 MG PO TABS: 1.0000 | ORAL_TABLET | Freq: Four times a day (QID) | ORAL | 0 refills | Status: AC | PRN

## 2021-02-22 MED ORDER — MORPHINE SULFATE (PF) 4 MG/ML IV SOLN
4.0000 mg | Freq: Once | INTRAVENOUS | Status: AC
  Administered 2021-02-22: 4 mg via INTRAVENOUS
  Filled 2021-02-22: qty 1

## 2021-02-22 NOTE — Discharge Instructions (Addendum)
Please remain nonweightbearing on the right lower extremity.  Keep splint on until follow-up with orthopedics.  You have been prescribed Norco, for pain up to 4 times daily.  You may also use an additional 650 mg of Tylenol with the Norco scheduled for pain.  Please follow-up with Dr. Harlow Mares of orthopedics.

## 2021-02-22 NOTE — Telephone Encounter (Unsigned)
Copied from [place] [id number]. Topic: General - Other >> Feb 22, 2021  3:46 PM Wynetta Emery, [place] C wrote: Reason for CRM: pt's daughter Ramiro Harvest is calling in to cancel pt's apt, stating she no longer need it. Daughter would also like to have PCP contact [facility] for Wellbutrin Rx.    [facility]: [phone]

## 2021-02-22 NOTE — ED Provider Notes (Signed)
[facility] Emergency Department Provider Note  ____________________________________________   Event Date/Time   First MD Initiated Contact with Patient 02/22/21 2116     (approximate)  I have reviewed the triage vital signs and the nursing notes.   HISTORY  Chief Complaint Leg Injury (right)  HPI [name] is a 70 y.o. female reports to the emergency department [facility] for evaluation of right ankle injury.  [facility] a stroke 6 weeks ago with remaining right-sided deficits.  She reports that she was trying to use the bathroom on her own at the facility, using a bar to grab onto from the toilet, when she stood, reports a mechanical fall in which the right ankle buckled underneath her.  She denies hitting her head, denies loss of consciousness or vomiting.  She is on anticoagulation with Eliquis.  She reports no pain in her right ankle if the ankle is not being moved.  She reports significant pain with any movement.  [facility] and concern for tib/fib fracture.  No alleviating measures were attempted prior to arrival.       Past Medical History:  Diagnosis Date  . Arthritis 04/02/1996  . Depression   . GERD (gastroesophageal reflux disease) 04/03/2019  . Hyperlipidemia   . Hypertension   . Renal insufficiency   . Stroke ([place])   . Thyroid disease     Patient Active Problem List   Diagnosis Date Noted  . Fever   . Acute blood loss anemia   . Neurogenic bladder   . 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 Ischemic cerebrovascular accident (CVA) of frontal lobe ([place]) 12/29/2020  . CVA (cerebral vascular accident) ([place]) 12/27/2020  . Acute CVA (cerebrovascular accident) ([place]) 12/26/2020  . Thrombocytopenia ([place]) 12/26/2020  . Antiphospholipid antibody syndrome ([place]) 02/10/2020  . Acute kidney failure ([place]) 11/04/2019  . Edema of lower extremity  11/04/2019  . Stage 3 chronic kidney disease ([place]) 11/04/2019  . Popliteal bursitis of left knee 08/25/2019  . Familial hypercholesterolemia 08/25/2019  . Renal insufficiency 08/25/2019  . Low hemoglobin 08/25/2019  . History of essential hypertension 08/25/2019  . Seasonal allergic rhinitis due to pollen 02/24/2019  . Age-related osteoporosis without current pathological fracture 01/20/2018  . Osteoporosis 11/12/2017  . Urinary incontinence 05/28/2017  . Knee pain 05/08/2017  . Muscle weakness 05/08/2017  . Sprain of MCL (medial collateral ligament) of knee 05/08/2017  . Adult hypothyroidism 10/23/2016  . Essential hypertension 10/23/2016  . Gastroesophageal reflux disease without esophagitis 10/23/2016  . Recurrent major depressive disorder, in full remission ([place]) 10/23/2016  . Anticoagulant long-term use 10/23/2016    Past Surgical History:  Procedure Laterality Date  . CESAREAN SECTION    . CHOLECYSTECTOMY    . COLONOSCOPY  2012   repeat in 10 yrs  . KNEE ARTHROSCOPY W/ MENISCAL REPAIR Left     Prior to Admission medications   Medication Sig Start Date End Date Taking? Authorizing Provider  HYDROcodone-acetaminophen (NORCO) 5-325 MG tablet Take 1 tablet by mouth every 6 (six) hours as needed for up to 5 days for moderate pain. 02/22/21 02/27/21 Yes Marlana Salvage, PA  acetaminophen (TYLENOL) 325 MG tablet Take 2 tablets (650 mg total) by mouth every 4 (four) hours as needed for mild pain (or temp > 37.5 C (99.5 F)). 12/29/20   Lorella Nimrod, MD  acetaminophen (TYLENOL) 325 MG tablet Take 2 tablets (650  mg total) by mouth every 4 (four) hours as needed for mild pain (or temp > 37.5 C (99.5 F)). 01/19/21   Angiulli, Lavon Paganini, PA-C  apixaban (ELIQUIS) 5 MG TABS tablet Take 1 tablet (5 mg total) by mouth 2 (two) times daily. 01/19/21   Angiulli, Lavon Paganini, PA-C  atorvastatin (LIPITOR) 20 MG tablet Take 1 tablet (20 mg total) by mouth daily. 01/20/21   Angiulli, Lavon Paganini, PA-C   buPROPion (WELLBUTRIN XL) 150 MG 24 hr tablet Take 1 tablet (150 mg total) by mouth daily. 02/08/21   Juline Patch, MD  diclofenac Sodium (VOLTAREN) 1 % GEL Apply 2 g topically 4 (four) times daily as needed (left knee pain). 01/19/21   Angiulli, Lavon Paganini, PA-C  donepezil (ARICEPT) 10 MG tablet Take 1 tablet by mouth daily. 10/31/20 11/04/21  [provider]  escitalopram (LEXAPRO) 20 MG tablet Take 1 tablet (20 mg total) by mouth daily. 12/12/20   Juline Patch, MD  famotidine (PEPCID) 20 MG tablet Take 1 tablet by mouth daily. singh 09/29/19   [provider]  ferrous sulfate 325 (65 FE) MG EC tablet Take 1 tablet (325 mg total) by mouth daily with breakfast. Patient taking differently: Take 325 mg by mouth at bedtime. 12/12/20   Juline Patch, MD  levothyroxine (SYNTHROID) 100 MCG tablet Take 1 tablet (100 mcg total) by mouth daily at 6 (six) AM. 12/30/20   Lorella Nimrod, MD  loperamide (IMODIUM) 2 MG capsule Take 1 capsule (2 mg total) by mouth every 6 (six) hours as needed for diarrhea or loose stools. 12/29/20   Lorella Nimrod, MD  montelukast (SINGULAIR) 10 MG tablet TAKE 1 TABLET BY MOUTH EVERYDAY AT BEDTIME 02/10/20   Juline Patch, MD  Multiple Vitamins-Minerals (CENTRUM SILVER 50+WOMEN PO) Take 1 tablet by mouth daily.    [provider]  neomycin-bacitracin-polymyxin (NEOSPORIN) OINT Apply 1 application topically 2 (two) times daily. 01/19/21   Angiulli, Lavon Paganini, PA-C  oxybutynin (DITROPAN XL) 15 MG 24 hr tablet Take 1 tablet (15 mg total) by mouth at bedtime. 01/19/21   Angiulli, Lavon Paganini, PA-C  traZODone (DESYREL) 100 MG tablet Take 1 tablet (100 mg total) by mouth at bedtime. 01/19/21   Angiulli, Lavon Paganini, PA-C  vitamin B-12 (CYANOCOBALAMIN) 1000 MCG tablet Take 1,000 mcg by mouth daily.    [provider]    Allergies Penicillin g  Family History  Problem Relation Age of Onset  . Stroke Father   . Breast cancer Maternal Grandmother   . Kidney  cancer Mother   . Cancer Mother   . Arthritis Brother     Social History Social History   Tobacco Use  . Smoking status: Never Smoker  . Smokeless tobacco: Never Used  . Tobacco comment: none  Vaping Use  . Vaping Use: Never used  Substance Use Topics  . Alcohol use: Yes    Alcohol/week: 1.0 standard drink    Types: 1 Glasses of wine per week    Comment: occasional drink  . Drug use: No    Review of Systems Constitutional: No fever/chills Eyes: No visual changes. ENT: No sore throat. Cardiovascular: Denies chest pain. Respiratory: Denies shortness of breath. Gastrointestinal: No abdominal pain.  No nausea, no vomiting.  No diarrhea.  No constipation. Genitourinary: Negative for dysuria. Musculoskeletal: + Right ankle pain, negative for back pain. Skin: Negative for rash. Neurological: Negative for headaches, focal weakness or numbness. ____________________________________________   PHYSICAL EXAM:  VITAL SIGNS: ED  Triage Vitals  Enc Vitals Group     BP 02/22/21 2016 (!) 142/102     Pulse Rate 02/22/21 2016 78     Resp 02/22/21 2016 20     Temp 02/22/21 2016 98.7 F (37.1 C)     Temp src --      SpO2 02/22/21 2016 98 %     Weight 02/22/21 2019 175 lb (79.4 kg)     Height 02/22/21 2019 5\' 8"  (1.727 m)     Head Circumference --      [facility] Flow --      Pain Score 02/22/21 2018 0     Pain Loc --      Pain Edu? --      Excl. in [place]? --    Constitutional: Alert and oriented. Well appearing and in no acute distress. Eyes: Conjunctivae are normal. PERRL. EOMI. Head: Atraumatic. Mouth/Throat: Mucous membranes are moist.  Neck: No stridor.  No tenderness to palpation of the midline of cervical spine or paraspinals.  No step-off deformities or crepitus. Cardiovascular: Normal rate, regular rhythm. Grossly normal heart sounds.  Respiratory: Normal respiratory effort.  No retractions. Lungs CTAB. Gastrointestinal: Soft and nontender. No distention. No abdominal bruits.  No CVA tenderness. Musculoskeletal: There is noted deformity to the right ankle.  The patient has the leg sitting in external rotation for comfort propped on a pillow.  There is ecchymosis noted with mild soft tissue swelling to the medial aspect of the ankle.  There is no tenderness to palpation of the right hip or knee.  Range of motion not assessed secondary to known x-ray findings.  Dorsal pedal pulse 2+, capillary refill less than 3 seconds. Neurologic:  Normal speech and language.  Residual right-sided deficits from prior stroke, cranial nerves II through XII grossly intact. Skin:  Skin is warm, dry and intact. No rash noted. Psychiatric: Mood and affect are normal. Speech and behavior are normal.  ____________________________________________   LABS (all labs ordered are listed, but only abnormal results are displayed)  Labs Reviewed  CBC WITH DIFFERENTIAL/PLATELET - Abnormal; Notable for the following components:      Result Value   RBC 2.65 (*)    Hemoglobin 8.7 (*)    HCT 26.6 (*)    MCV 100.4 (*)    Platelets 82 (*)    All other components within normal limits  COMPREHENSIVE METABOLIC PANEL - Abnormal; Notable for the following components:   CO2 20 (*)    Glucose, Bld 106 (*)    BUN 27 (*)    Creatinine, Ser 1.80 (*)    Albumin 3.3 (*)    GFR, Estimated 30 (*)    All other components within normal limits  PROTIME-INR - Abnormal; Notable for the following components:   Prothrombin Time 21.5 (*)    INR 2.0 (*)    All other components within normal limits  APTT - Abnormal; Notable for the following components:   aPTT 147 (*)    All other components within normal limits   ____________________________________________  RADIOLOGY I, Marlana Salvage, personally viewed and evaluated these images (plain radiographs) as part of my medical decision making, as well as reviewing the written report by the radiologist.  ED provider interpretation: X-ray of the right ankle reveals  a mildly displaced trimalleolar fracture.  See radiology report for CT head and neck findings.  Official radiology report(s): DG Ankle Complete Right  Result Date: 02/22/2021 CLINICAL DATA:  Right ankle pain, fell EXAM: RIGHT ANKLE -  COMPLETE 3+ VIEW COMPARISON:  05/23/2020 FINDINGS: Frontal, oblique, lateral views of the right ankle are obtained. There is an oblique displaced lateral malleolar fracture, with lateral subluxation of the talus relative to the tibial plafond. A minimally comminuted transverse medial malleolar fracture is also noted, with mild distraction. The minimally displaced fracture through the posterior malleolus is noted on lateral view. There is diffuse soft tissue swelling. Large inferior calcaneal spur. IMPRESSION: 1. Trimalleolar right ankle fracture, with lateral subluxation of the talus. 2. Diffuse soft tissue swelling. Electronically Signed   By: Randa Ngo M.D.   On: 02/22/2021 20:41   CT Head Wo Contrast  Result Date: 02/22/2021 CLINICAL DATA:  [place]. EXAM: CT HEAD WITHOUT CONTRAST CT CERVICAL SPINE WITHOUT CONTRAST TECHNIQUE: Multidetector CT imaging of the head and cervical spine was performed following the standard protocol without intravenous contrast. Multiplanar CT image reconstructions of the cervical spine were also generated. COMPARISON:  None. FINDINGS: CT HEAD FINDINGS Brain: Age advanced cerebral atrophy and associated ventriculomegaly. Evidence of multiple prior infarcts most notably in the right occipital lobe with associated encephalomalacia and ex vacuo dilatation of the occipital horn of the right lateral ventricle. I do not see any findings suggestive of an acute hemispheric infarction. No intracranial hemorrhage. No extra-axial fluid collections. Vascular: Vascular calcifications but no definite aneurysm or hyperdense vessels. Skull: No skull fracture or bone lesions. Sinuses/Orbits: The paranasal sinuses and mastoid air cells are clear. The globes are  intact. Other: No scalp lesions or scalp hematoma. CT CERVICAL SPINE FINDINGS Alignment: Normal overall alignment of the cervical vertebral bodies. Skull base and vertebrae: No acute fracture. No primary bone lesion or focal pathologic process. Soft tissues and spinal canal: No prevertebral fluid or swelling. No visible canal hematoma. Disc levels: Degenerative cervical spondylosis with disc disease and facet disease most notable at C4-5 and C5-6. No large disc protrusions or significant spinal stenosis. Uncinate spurring and facet disease but no significant foraminal stenosis. Upper chest: The visualized lung apices are grossly clear. Other: No neck mass, adenopathy or hematoma. IMPRESSION: 1. Age advanced cerebral atrophy and associated ventriculomegaly. 2. Evidence of multiple prior infarcts. 3. No acute intracranial findings or skull fracture. 4. Degenerative cervical spondylosis with disc disease and facet disease but no acute cervical spine fracture. Electronically Signed   By: Marijo Sanes M.D.   On: 02/22/2021 21:52   CT Cervical Spine Wo Contrast  Result Date: 02/22/2021 CLINICAL DATA:  [place]. EXAM: CT HEAD WITHOUT CONTRAST CT CERVICAL SPINE WITHOUT CONTRAST TECHNIQUE: Multidetector CT imaging of the head and cervical spine was performed following the standard protocol without intravenous contrast. Multiplanar CT image reconstructions of the cervical spine were also generated. COMPARISON:  None. FINDINGS: CT HEAD FINDINGS Brain: Age advanced cerebral atrophy and associated ventriculomegaly. Evidence of multiple prior infarcts most notably in the right occipital lobe with associated encephalomalacia and ex vacuo dilatation of the occipital horn of the right lateral ventricle. I do not see any findings suggestive of an acute hemispheric infarction. No intracranial hemorrhage. No extra-axial fluid collections. Vascular: Vascular calcifications but no definite aneurysm or hyperdense vessels. Skull: No skull  fracture or bone lesions. Sinuses/Orbits: The paranasal sinuses and mastoid air cells are clear. The globes are intact. Other: No scalp lesions or scalp hematoma. CT CERVICAL SPINE FINDINGS Alignment: Normal overall alignment of the cervical vertebral bodies. Skull base and vertebrae: No acute fracture. No primary bone lesion or focal pathologic process. Soft tissues and spinal canal: No prevertebral fluid or swelling. No visible  canal hematoma. Disc levels: Degenerative cervical spondylosis with disc disease and facet disease most notable at C4-5 and C5-6. No large disc protrusions or significant spinal stenosis. Uncinate spurring and facet disease but no significant foraminal stenosis. Upper chest: The visualized lung apices are grossly clear. Other: No neck mass, adenopathy or hematoma. IMPRESSION: 1. Age advanced cerebral atrophy and associated ventriculomegaly. 2. Evidence of multiple prior infarcts. 3. No acute intracranial findings or skull fracture. 4. Degenerative cervical spondylosis with disc disease and facet disease but no acute cervical spine fracture. Electronically Signed   By: Marijo Sanes M.D.   On: 02/22/2021 21:52    ____________________________________________   PROCEDURES  Procedure(s) performed (including Critical Care):  .Ortho Injury Treatment  Date/Time: 02/22/2021 11:01 PM Performed by: Marlana Salvage, PA Authorized by: Marlana Salvage, PA   Consent:    Consent obtained:  Verbal   Consent given by:  Patient   Risks discussed:  Fracture   Alternatives discussed:  No treatment and referralInjury location: ankle Location details: right ankle Injury type: fracture Fracture type: trimalleolar Pre-procedure neurovascular assessment: neurovascularly intact Pre-procedure distal perfusion: normal Pre-procedure neurological function: normal Pre-procedure range of motion: reduced  Anesthesia: Local anesthesia used: no  Patient sedated: NoManipulation performed:  no Immobilization: splint Splint type: short leg Splint Applied by: ED Nurse Supplies used: cotton padding,  elastic bandage and Ortho-Glass Post-procedure neurovascular assessment: post-procedure neurovascularly intact Post-procedure distal perfusion: normal Post-procedure neurological function: normal      ____________________________________________   INITIAL IMPRESSION / ASSESSMENT AND PLAN / ED COURSE  As part of my medical decision making, I reviewed the following data within the [place] History obtained from family, Nursing notes reviewed and incorporated, Labs reviewed, Radiograph reviewed, Notes from prior ED visits and [place] Controlled Substance Database        Patient is a 70 year old female who reports to the emergency department for evaluation of right lower extremity injury following a mechanical fall at her rehab facility.  She denies any her head or losing consciousness, and has only complaining of right ankle pain.  See HPI for details.  On physical exam, the patient's cranial nerves II through XII are grossly intact, she has residual right-sided deficits from known stroke 6 weeks ago.  She does have obvious deformity to her right ankle with ecchymosis over the medial aspect.  She has appropriate distal perfusion.  X-rays were obtained of the right ankle, which demonstrates a trimalleolar fracture with mild displacement.  Dr. Harlow Mares, the on-call orthopedist was consulted who recommended placement in a splint with outpatient follow-up in his facility and to remain nonweightbearing.  The patient states she will have no difficulty with this given she has been using a wheelchair.  Will treat the patient with Norco and Tylenol given her renal function.  In addition, patient did have a fall, on Eliquis and though she did not hit her head, is high risk for bleed.  CT of the head and neck was obtained and is negative for acute bleed.  Labs were also obtained.   Patient's labs appear stable to recent hospitalization.  Patient is stable at this time for transport back to her rehab facility.  She is to remain nonweightbearing on the right lower extremities with Ortho follow-up.        ____________________________________________   FINAL CLINICAL IMPRESSION(S) / ED DIAGNOSES  Final diagnoses:  Closed trimalleolar fracture of right ankle, initial encounter  Fall, initial encounter     ED Discharge Orders  Ordered    HYDROcodone-acetaminophen (NORCO) 5-325 MG tablet  Every 6 hours PRN        02/22/21 2205          *Please note:  Nanette Wirsing Plocher was evaluated in Emergency Department on 02/22/2021 for the symptoms described in the history of present illness. She was evaluated in the context of the global COVID-19 pandemic, which necessitated consideration that the patient might be at risk for infection with the SARS-CoV-2 virus that causes COVID-19. Institutional protocols and algorithms that pertain to the evaluation of patients at risk for COVID-19 are in a state of rapid change based on information released by regulatory bodies including the CDC and federal and state organizations. These policies and algorithms were followed during the patient's care in the ED.  Some ED evaluations and interventions may be delayed as a result of limited staffing during and the pandemic.*   Note:  This document was prepared using Dragon voice recognition software and may include unintentional dictation errors.   Marlana Salvage, PA 02/22/21 2315    Blake Divine, MD 02/22/21 2325

## 2021-02-22 NOTE — ED Notes (Signed)
RN attempted to call report to [facility], RN got a Advertising account executive.

## 2021-02-22 NOTE — ED Notes (Signed)
RN reviewed discharge instructions with pt and pts daughter.

## 2021-02-22 NOTE — ED Notes (Signed)
Call [facility] to transport pt back to [facility] @ 10:15pm. Pt's daughter is taking paperwork/discharge instructions to give [facility].

## 2021-02-22 NOTE — ED Triage Notes (Signed)
[facility] B[facility] after a fall at lunch with a confirmed tib/fib fx from location. Pt states she was trying to stand up with a cord and fell. Pt denies hitting head and denies LOC. Right ankle swollen, and purple/bruised in color. +2 right DP pulse.  Pt at [facility] for a stroke with right sided deficits.

## 2021-02-23 ENCOUNTER — Telehealth: Payer: Self-pay

## 2021-02-23 ENCOUNTER — Ambulatory Visit: Payer: Medicare Other | Admitting: Family Medicine

## 2021-02-23 DIAGNOSIS — N1831 Chronic kidney disease, stage 3a: Secondary | ICD-10-CM | POA: Diagnosis not present

## 2021-02-23 DIAGNOSIS — R5381 Other malaise: Secondary | ICD-10-CM | POA: Diagnosis not present

## 2021-02-23 DIAGNOSIS — Z7401 Bed confinement status: Secondary | ICD-10-CM | POA: Diagnosis not present

## 2021-02-23 DIAGNOSIS — S82841A Displaced bimalleolar fracture of right lower leg, initial encounter for closed fracture: Secondary | ICD-10-CM | POA: Diagnosis not present

## 2021-02-23 DIAGNOSIS — R631 Polydipsia: Secondary | ICD-10-CM | POA: Diagnosis not present

## 2021-02-23 DIAGNOSIS — S82851A Displaced trimalleolar fracture of right lower leg, initial encounter for closed fracture: Secondary | ICD-10-CM | POA: Diagnosis not present

## 2021-02-23 DIAGNOSIS — Z743 Need for continuous supervision: Secondary | ICD-10-CM | POA: Diagnosis not present

## 2021-02-23 DIAGNOSIS — M2550 Pain in unspecified joint: Secondary | ICD-10-CM | POA: Diagnosis not present

## 2021-02-23 NOTE — Telephone Encounter (Signed)
Spoke to Legrand Como at [employer] and asked him to add the Wellbutrin to her med list. Take in addition to Escitalopram. Legrand Como said he would let the head MD know and would call us back if any further questions. I gave him the back door line

## 2021-02-23 NOTE — Telephone Encounter (Addendum)
[name] has broken her ankle and need surgical guidance on Rx Eliquis. Hillary (Daughter) was advised by the surgeon to call the prescribe for guidance.   *Hillary is aware you are not in the office until next week. But told you would be informed.   She was also advised per discharge summary:  Antiphospholipid antibody syndrome thrombocytopenia follow-up hematology services as needed continue Eliquis. And to check with PCP for out-patient referral.    Surgical consult on 03/02/2020. If area worsens seek emergent care. Patient was seen in the ED.  Discharged stable non-weightbearing on right lower extremities.

## 2021-02-23 NOTE — Telephone Encounter (Signed)
Spoke to Legrand Como at [employer]- told to add med and gave back door line if any further questions from head doctor

## 2021-02-23 NOTE — Telephone Encounter (Signed)
Placed a call to [facility]- will put in referral to hematology, let us know if cardiology referral is needed for surg clearance as well

## 2021-02-23 NOTE — Telephone Encounter (Signed)
Copied from [place] [id number]. Topic: General - Other >> Feb 23, 2021  4:19 PM Oneta Rack wrote: Caller states patient broke her ankle and surgeon is requesting PCP to refer patient to a haematologist prior to surgery. Patient has an appointment to discuss the surgery date on March 31st, please follow up.

## 2021-02-24 NOTE — Telephone Encounter (Signed)
Hillary (Daughter) is aware of Dr. Naaman Plummer reply and need for follow up.

## 2021-02-24 NOTE — Telephone Encounter (Signed)
Eliquis was decided upon by Dr. Letta Pate and Dr. Leonie Man for stroke prophylaxis. Dr. Leonie Man requested an outpt f/u visit 6 weeks from his last encounter 01/02/21.  I don't see that she's been to him yet. Neurology would be the one to make recommendations regarding timing/safety of holding eliquis prior any potential surgery.

## 2021-02-27 ENCOUNTER — Ambulatory Visit: Payer: Medicare Other

## 2021-02-27 DIAGNOSIS — S82851A Displaced trimalleolar fracture of right lower leg, initial encounter for closed fracture: Secondary | ICD-10-CM | POA: Diagnosis not present

## 2021-03-01 DIAGNOSIS — R9431 Abnormal electrocardiogram [ECG] [EKG]: Secondary | ICD-10-CM | POA: Diagnosis not present

## 2021-03-02 ENCOUNTER — Telehealth: Payer: Self-pay

## 2021-03-02 ENCOUNTER — Telehealth: Payer: Self-pay | Admitting: *Deleted

## 2021-03-02 DIAGNOSIS — S82841A Displaced bimalleolar fracture of right lower leg, initial encounter for closed fracture: Secondary | ICD-10-CM | POA: Diagnosis not present

## 2021-03-02 NOTE — Telephone Encounter (Addendum)
Mrs. Rotenberg's daughter Ramiro Harvest called back about the problem with her Eliquis and needing someone to direct the orthopedic surgeon about holding for surgery next week.(see previous message from Dr Naaman Plummer directing her to Dr Leonie Man)  Mrs [facility] is residing in a LTC facility in [place] called [facility].  She apparently fell at the facility and shattered her ankle and is needing surgery next week.  The problem is that because she is in LTC and cannot get her phone messages, which is why she was not at her follow up appt with Dr Letta Pate and [facility] has not been able to make contact. (I have made her daughter Hillary's phone the main number so that all appt messages and calls will go through her).   I called [facility] to find out why the daughter was told she was not a patient of Dr Leonie Man. Mrs [facility] has not been established with [facility] following inpt rehab discharge because they were not able to reach her. This poses a problem with who will give the orders for the Eliquis pre-surgical hold.  The ortho surgeon is Renee Harder @[facility] in [place].  The physician at the [facility] is a Dr. Livia Snellen and a NP (first name known only) [facility].    Per Dr Letta Pate the decision will have to be made by neurology because of her stroke.

## 2021-03-02 NOTE — Telephone Encounter (Signed)
Called [facility] in [place], spoke to [place] who is filling in for Dr. Nathanial Millman nurse today.  Relayed Dr. Clydene Fake information regarding surgical clearance and Eliquis.    Megan verbalized understanding and had no further questions.

## 2021-03-02 NOTE — Telephone Encounter (Signed)
Returned call to Sybil at [facility].  Left message that medication is prescribed and managed by PCP and requests for changes/surgical clearance would come from PCP for that medication.  Left call back number if further questions.

## 2021-03-02 NOTE — Telephone Encounter (Signed)
Agree with plan 

## 2021-03-02 NOTE — Telephone Encounter (Signed)
I received a phone call from [place] [facility], c/b [phone]  Patient was to be scheduled for a hospital f/u 4-6 weeks post discharge however we were unable to contact her to schedule same. She is in a LTC facility.  Patient now has a broken ankle and needs it surgically repaired next week. We received a call because she needs a presurgical hold on her Eliquis and they are unsure who will order that.   Can you please advise?

## 2021-03-02 NOTE — Telephone Encounter (Signed)
It is ok to hold eliquis for 2 days prior to ankle surgery and restart after when safe with a small but acceptable periprocedural risk of stroke .Kindly communicate this to Dr Sharlet Salina @ [facility]

## 2021-03-04 DIAGNOSIS — N390 Urinary tract infection, site not specified: Secondary | ICD-10-CM | POA: Diagnosis not present

## 2021-03-06 DIAGNOSIS — N390 Urinary tract infection, site not specified: Secondary | ICD-10-CM | POA: Diagnosis not present

## 2021-03-06 DIAGNOSIS — G9341 Metabolic encephalopathy: Secondary | ICD-10-CM | POA: Diagnosis not present

## 2021-03-06 DIAGNOSIS — I69351 Hemiplegia and hemiparesis following cerebral infarction affecting right dominant side: Secondary | ICD-10-CM | POA: Diagnosis not present

## 2021-03-06 DIAGNOSIS — N1831 Chronic kidney disease, stage 3a: Secondary | ICD-10-CM | POA: Diagnosis not present

## 2021-03-06 DIAGNOSIS — S82851A Displaced trimalleolar fracture of right lower leg, initial encounter for closed fracture: Secondary | ICD-10-CM | POA: Diagnosis not present

## 2021-03-07 DIAGNOSIS — E785 Hyperlipidemia, unspecified: Secondary | ICD-10-CM | POA: Diagnosis not present

## 2021-03-07 DIAGNOSIS — E559 Vitamin D deficiency, unspecified: Secondary | ICD-10-CM | POA: Diagnosis not present

## 2021-03-07 DIAGNOSIS — I10 Essential (primary) hypertension: Secondary | ICD-10-CM | POA: Diagnosis not present

## 2021-03-07 DIAGNOSIS — I110 Hypertensive heart disease with heart failure: Secondary | ICD-10-CM | POA: Diagnosis not present

## 2021-03-08 DIAGNOSIS — I69351 Hemiplegia and hemiparesis following cerebral infarction affecting right dominant side: Secondary | ICD-10-CM | POA: Diagnosis not present

## 2021-03-08 DIAGNOSIS — D649 Anemia, unspecified: Secondary | ICD-10-CM | POA: Diagnosis not present

## 2021-03-08 DIAGNOSIS — N1831 Chronic kidney disease, stage 3a: Secondary | ICD-10-CM | POA: Diagnosis not present

## 2021-03-08 DIAGNOSIS — D696 Thrombocytopenia, unspecified: Secondary | ICD-10-CM | POA: Diagnosis not present

## 2021-03-08 DIAGNOSIS — N390 Urinary tract infection, site not specified: Secondary | ICD-10-CM | POA: Diagnosis not present

## 2021-03-09 DIAGNOSIS — D696 Thrombocytopenia, unspecified: Secondary | ICD-10-CM | POA: Diagnosis not present

## 2021-03-09 DIAGNOSIS — D649 Anemia, unspecified: Secondary | ICD-10-CM | POA: Diagnosis not present

## 2021-03-09 DIAGNOSIS — N390 Urinary tract infection, site not specified: Secondary | ICD-10-CM | POA: Diagnosis not present

## 2021-03-09 DIAGNOSIS — S82841A Displaced bimalleolar fracture of right lower leg, initial encounter for closed fracture: Secondary | ICD-10-CM | POA: Diagnosis not present

## 2021-03-09 DIAGNOSIS — N1831 Chronic kidney disease, stage 3a: Secondary | ICD-10-CM | POA: Diagnosis not present

## 2021-03-10 DIAGNOSIS — R079 Chest pain, unspecified: Secondary | ICD-10-CM | POA: Diagnosis not present

## 2021-03-11 ENCOUNTER — Emergency Department: Payer: Medicare Other

## 2021-03-11 ENCOUNTER — Encounter: Payer: Self-pay | Admitting: Emergency Medicine

## 2021-03-11 ENCOUNTER — Other Ambulatory Visit: Payer: Self-pay | Admitting: Family Medicine

## 2021-03-11 ENCOUNTER — Inpatient Hospital Stay
Admission: EM | Admit: 2021-03-11 | Discharge: 2021-03-17 | DRG: 064 | Disposition: A | Discharge status: Skilled Nursing Facility | Payer: Medicare Other | Source: Skilled Nursing Facility | Attending: Internal Medicine | Admitting: Internal Medicine

## 2021-03-11 ENCOUNTER — Other Ambulatory Visit: Payer: Self-pay

## 2021-03-11 DIAGNOSIS — I6340 Cerebral infarction due to embolism of unspecified cerebral artery: Principal | ICD-10-CM | POA: Diagnosis present

## 2021-03-11 DIAGNOSIS — G319 Degenerative disease of nervous system, unspecified: Secondary | ICD-10-CM | POA: Diagnosis not present

## 2021-03-11 DIAGNOSIS — R29704 NIHSS score 4: Secondary | ICD-10-CM | POA: Diagnosis present

## 2021-03-11 DIAGNOSIS — I639 Cerebral infarction, unspecified: Secondary | ICD-10-CM | POA: Diagnosis present

## 2021-03-11 DIAGNOSIS — D735 Infarction of spleen: Secondary | ICD-10-CM | POA: Diagnosis not present

## 2021-03-11 DIAGNOSIS — Z880 Allergy status to penicillin: Secondary | ICD-10-CM

## 2021-03-11 DIAGNOSIS — I6612 Occlusion and stenosis of left anterior cerebral artery: Secondary | ICD-10-CM | POA: Diagnosis not present

## 2021-03-11 DIAGNOSIS — Z20822 Contact with and (suspected) exposure to covid-19: Secondary | ICD-10-CM | POA: Diagnosis not present

## 2021-03-11 DIAGNOSIS — D696 Thrombocytopenia, unspecified: Secondary | ICD-10-CM | POA: Diagnosis present

## 2021-03-11 DIAGNOSIS — E278 Other specified disorders of adrenal gland: Secondary | ICD-10-CM | POA: Diagnosis not present

## 2021-03-11 DIAGNOSIS — E538 Deficiency of other specified B group vitamins: Secondary | ICD-10-CM | POA: Diagnosis present

## 2021-03-11 DIAGNOSIS — I214 Non-ST elevation (NSTEMI) myocardial infarction: Secondary | ICD-10-CM

## 2021-03-11 DIAGNOSIS — N183 Chronic kidney disease, stage 3 unspecified: Secondary | ICD-10-CM | POA: Diagnosis not present

## 2021-03-11 DIAGNOSIS — R410 Disorientation, unspecified: Secondary | ICD-10-CM

## 2021-03-11 DIAGNOSIS — I129 Hypertensive chronic kidney disease with stage 1 through stage 4 chronic kidney disease, or unspecified chronic kidney disease: Secondary | ICD-10-CM | POA: Diagnosis present

## 2021-03-11 DIAGNOSIS — F419 Anxiety disorder, unspecified: Secondary | ICD-10-CM | POA: Diagnosis present

## 2021-03-11 DIAGNOSIS — S82851D Displaced trimalleolar fracture of right lower leg, subsequent encounter for closed fracture with routine healing: Secondary | ICD-10-CM

## 2021-03-11 DIAGNOSIS — D631 Anemia in chronic kidney disease: Secondary | ICD-10-CM | POA: Diagnosis present

## 2021-03-11 DIAGNOSIS — I708 Atherosclerosis of other arteries: Secondary | ICD-10-CM | POA: Diagnosis not present

## 2021-03-11 DIAGNOSIS — N1831 Chronic kidney disease, stage 3a: Secondary | ICD-10-CM

## 2021-03-11 DIAGNOSIS — G8191 Hemiplegia, unspecified affecting right dominant side: Secondary | ICD-10-CM | POA: Diagnosis not present

## 2021-03-11 DIAGNOSIS — R935 Abnormal findings on diagnostic imaging of other abdominal regions, including retroperitoneum: Secondary | ICD-10-CM | POA: Diagnosis not present

## 2021-03-11 DIAGNOSIS — D6861 Antiphospholipid syndrome: Secondary | ICD-10-CM | POA: Diagnosis not present

## 2021-03-11 DIAGNOSIS — H53462 Homonymous bilateral field defects, left side: Secondary | ICD-10-CM | POA: Diagnosis present

## 2021-03-11 DIAGNOSIS — Z7901 Long term (current) use of anticoagulants: Secondary | ICD-10-CM

## 2021-03-11 DIAGNOSIS — F32A Depression, unspecified: Secondary | ICD-10-CM | POA: Diagnosis present

## 2021-03-11 DIAGNOSIS — Z79899 Other long term (current) drug therapy: Secondary | ICD-10-CM

## 2021-03-11 DIAGNOSIS — Z86718 Personal history of other venous thrombosis and embolism: Secondary | ICD-10-CM

## 2021-03-11 DIAGNOSIS — W19XXXD Unspecified fall, subsequent encounter: Secondary | ICD-10-CM | POA: Diagnosis present

## 2021-03-11 DIAGNOSIS — L89322 Pressure ulcer of left buttock, stage 2: Secondary | ICD-10-CM | POA: Diagnosis not present

## 2021-03-11 DIAGNOSIS — L8990 Pressure ulcer of unspecified site, unspecified stage: Secondary | ICD-10-CM | POA: Insufficient documentation

## 2021-03-11 DIAGNOSIS — I10 Essential (primary) hypertension: Secondary | ICD-10-CM | POA: Diagnosis not present

## 2021-03-11 DIAGNOSIS — Z66 Do not resuscitate: Secondary | ICD-10-CM | POA: Diagnosis present

## 2021-03-11 DIAGNOSIS — E860 Dehydration: Secondary | ICD-10-CM | POA: Diagnosis not present

## 2021-03-11 DIAGNOSIS — R7989 Other specified abnormal findings of blood chemistry: Secondary | ICD-10-CM | POA: Diagnosis present

## 2021-03-11 DIAGNOSIS — E039 Hypothyroidism, unspecified: Secondary | ICD-10-CM | POA: Diagnosis not present

## 2021-03-11 DIAGNOSIS — G9341 Metabolic encephalopathy: Secondary | ICD-10-CM | POA: Diagnosis not present

## 2021-03-11 DIAGNOSIS — Z743 Need for continuous supervision: Secondary | ICD-10-CM | POA: Diagnosis not present

## 2021-03-11 DIAGNOSIS — Z823 Family history of stroke: Secondary | ICD-10-CM

## 2021-03-11 DIAGNOSIS — G4700 Insomnia, unspecified: Secondary | ICD-10-CM | POA: Diagnosis present

## 2021-03-11 DIAGNOSIS — R7402 Elevation of levels of lactic acid dehydrogenase (LDH): Secondary | ICD-10-CM | POA: Diagnosis present

## 2021-03-11 DIAGNOSIS — K219 Gastro-esophageal reflux disease without esophagitis: Secondary | ICD-10-CM | POA: Diagnosis present

## 2021-03-11 DIAGNOSIS — R404 Transient alteration of awareness: Secondary | ICD-10-CM | POA: Diagnosis not present

## 2021-03-11 DIAGNOSIS — M1990 Unspecified osteoarthritis, unspecified site: Secondary | ICD-10-CM | POA: Diagnosis present

## 2021-03-11 DIAGNOSIS — I69398 Other sequelae of cerebral infarction: Secondary | ICD-10-CM

## 2021-03-11 DIAGNOSIS — N1832 Chronic kidney disease, stage 3b: Secondary | ICD-10-CM | POA: Diagnosis present

## 2021-03-11 DIAGNOSIS — Z87440 Personal history of urinary (tract) infections: Secondary | ICD-10-CM | POA: Diagnosis not present

## 2021-03-11 DIAGNOSIS — Z79890 Hormone replacement therapy (postmenopausal): Secondary | ICD-10-CM

## 2021-03-11 DIAGNOSIS — Z8261 Family history of arthritis: Secondary | ICD-10-CM

## 2021-03-11 DIAGNOSIS — G9340 Encephalopathy, unspecified: Secondary | ICD-10-CM | POA: Diagnosis not present

## 2021-03-11 DIAGNOSIS — R778 Other specified abnormalities of plasma proteins: Secondary | ICD-10-CM | POA: Diagnosis present

## 2021-03-11 DIAGNOSIS — Z803 Family history of malignant neoplasm of breast: Secondary | ICD-10-CM

## 2021-03-11 DIAGNOSIS — R109 Unspecified abdominal pain: Secondary | ICD-10-CM | POA: Diagnosis not present

## 2021-03-11 DIAGNOSIS — M47817 Spondylosis without myelopathy or radiculopathy, lumbosacral region: Secondary | ICD-10-CM | POA: Diagnosis not present

## 2021-03-11 DIAGNOSIS — N3001 Acute cystitis with hematuria: Secondary | ICD-10-CM

## 2021-03-11 DIAGNOSIS — D649 Anemia, unspecified: Secondary | ICD-10-CM | POA: Diagnosis not present

## 2021-03-11 DIAGNOSIS — I82409 Acute embolism and thrombosis of unspecified deep veins of unspecified lower extremity: Secondary | ICD-10-CM | POA: Diagnosis not present

## 2021-03-11 DIAGNOSIS — N179 Acute kidney failure, unspecified: Secondary | ICD-10-CM

## 2021-03-11 DIAGNOSIS — N189 Chronic kidney disease, unspecified: Secondary | ICD-10-CM | POA: Diagnosis not present

## 2021-03-11 DIAGNOSIS — D68312 Antiphospholipid antibody with hemorrhagic disorder: Secondary | ICD-10-CM | POA: Diagnosis not present

## 2021-03-11 DIAGNOSIS — G9389 Other specified disorders of brain: Secondary | ICD-10-CM | POA: Diagnosis present

## 2021-03-11 DIAGNOSIS — E785 Hyperlipidemia, unspecified: Secondary | ICD-10-CM | POA: Diagnosis present

## 2021-03-11 DIAGNOSIS — K5900 Constipation, unspecified: Secondary | ICD-10-CM | POA: Diagnosis not present

## 2021-03-11 DIAGNOSIS — J301 Allergic rhinitis due to pollen: Secondary | ICD-10-CM

## 2021-03-11 DIAGNOSIS — I480 Paroxysmal atrial fibrillation: Secondary | ICD-10-CM | POA: Diagnosis present

## 2021-03-11 DIAGNOSIS — F0150 Vascular dementia without behavioral disturbance: Secondary | ICD-10-CM | POA: Diagnosis present

## 2021-03-11 DIAGNOSIS — Z8051 Family history of malignant neoplasm of kidney: Secondary | ICD-10-CM

## 2021-03-11 DIAGNOSIS — Q433 Congenital malformations of intestinal fixation: Secondary | ICD-10-CM | POA: Diagnosis not present

## 2021-03-11 DIAGNOSIS — R4181 Age-related cognitive decline: Secondary | ICD-10-CM | POA: Diagnosis not present

## 2021-03-11 LAB — COMPREHENSIVE METABOLIC PANEL
ALT: 44 U/L (ref 0–44)
AST: 46 U/L — ABNORMAL HIGH (ref 15–41)
Albumin: 2.7 g/dL — ABNORMAL LOW (ref 3.5–5.0)
Alkaline Phosphatase: 262 U/L — ABNORMAL HIGH (ref 38–126)
Anion gap: 6 (ref 5–15)
BUN: 35 mg/dL — ABNORMAL HIGH (ref 8–23)
CO2: 25 mmol/L (ref 22–32)
Calcium: 10 mg/dL (ref 8.9–10.3)
Chloride: 101 mmol/L (ref 98–111)
Creatinine, Ser: 2.03 mg/dL — ABNORMAL HIGH (ref 0.44–1.00)
GFR, Estimated: 26 mL/min — ABNORMAL LOW (ref >60)
Glucose, Bld: 112 mg/dL — ABNORMAL HIGH (ref 70–99)
Potassium: 3.9 mmol/L (ref 3.5–5.1)
Sodium: 132 mmol/L — ABNORMAL LOW (ref 135–145)
Total Bilirubin: 0.9 mg/dL (ref 0.3–1.2)
Total Protein: 7.2 g/dL (ref 6.5–8.1)

## 2021-03-11 LAB — CBC WITH DIFFERENTIAL/PLATELET
Abs Immature Granulocytes: 0.16 10*3/uL — ABNORMAL HIGH (ref 0.00–0.07)
Basophils Absolute: 0 10*3/uL (ref 0.0–0.1)
Basophils Relative: 0 %
Eosinophils Absolute: 0.2 10*3/uL (ref 0.0–0.5)
Eosinophils Relative: 2 %
HCT: 26.4 % — ABNORMAL LOW (ref 36.0–46.0)
Hemoglobin: 8.6 g/dL — ABNORMAL LOW (ref 12.0–15.0)
Immature Granulocytes: 2 %
Lymphocytes Relative: 14 %
Lymphs Abs: 1.2 10*3/uL (ref 0.7–4.0)
MCH: 30.8 pg (ref 26.0–34.0)
MCHC: 32.6 g/dL (ref 30.0–36.0)
MCV: 94.6 fL (ref 80.0–100.0)
Monocytes Absolute: 0.6 10*3/uL (ref 0.1–1.0)
Monocytes Relative: 7 %
Neutro Abs: 6.3 10*3/uL (ref 1.7–7.7)
Neutrophils Relative %: 75 %
Platelets: 104 10*3/uL — ABNORMAL LOW (ref 150–400)
RBC: 2.79 MIL/uL — ABNORMAL LOW (ref 3.87–5.11)
RDW: 12.8 % (ref 11.5–15.5)
WBC: 8.5 10*3/uL (ref 4.0–10.5)
nRBC: 0 % (ref 0.0–0.2)

## 2021-03-11 LAB — TROPONIN I (HIGH SENSITIVITY)
Troponin I (High Sensitivity): 543 ng/L (ref <18)
Troponin I (High Sensitivity): 564 ng/L (ref <18)

## 2021-03-11 LAB — URINALYSIS, COMPLETE (UACMP) WITH MICROSCOPIC
Bilirubin Urine: NEGATIVE
Glucose, UA: NEGATIVE mg/dL
Ketones, ur: NEGATIVE mg/dL
Nitrite: NEGATIVE
Protein, ur: NEGATIVE mg/dL
Specific Gravity, Urine: 1.008 (ref 1.005–1.030)
WBC, UA: >50 WBC/hpf — ABNORMAL HIGH (ref 0–5)
pH: 6 (ref 5.0–8.0)

## 2021-03-11 LAB — BRAIN NATRIURETIC PEPTIDE: B Natriuretic Peptide: 341.9 pg/mL — ABNORMAL HIGH (ref 0.0–100.0)

## 2021-03-11 LAB — APTT: aPTT: 150 seconds — ABNORMAL HIGH (ref 24–36)

## 2021-03-11 LAB — PROCALCITONIN: Procalcitonin: 59.38 ng/mL

## 2021-03-11 LAB — PROTIME-INR
INR: 1.6 — ABNORMAL HIGH (ref 0.8–1.2)
Prothrombin Time: 18.4 seconds — ABNORMAL HIGH (ref 11.4–15.2)

## 2021-03-11 LAB — SARS CORONAVIRUS 2 (TAT 6-24 HRS): SARS Coronavirus 2: NEGATIVE

## 2021-03-11 LAB — AMMONIA: Ammonia: <9 umol/L — ABNORMAL LOW (ref 9–35)

## 2021-03-11 LAB — HEPARIN LEVEL (UNFRACTIONATED): Heparin Unfractionated: 3.38 IU/mL — ABNORMAL HIGH (ref 0.30–0.70)

## 2021-03-11 IMAGING — CR DG CHEST 2V
1 series · 2 of 2 positions shown · non-contrast
Comparison: [DATE]

CLINICAL DATA: Confusion

EXAM:
CHEST - 2 VIEW

[Series 1: dg chest 2 view · 0.14mm/px · 2 of 2 slices shown]
[im 1/2]
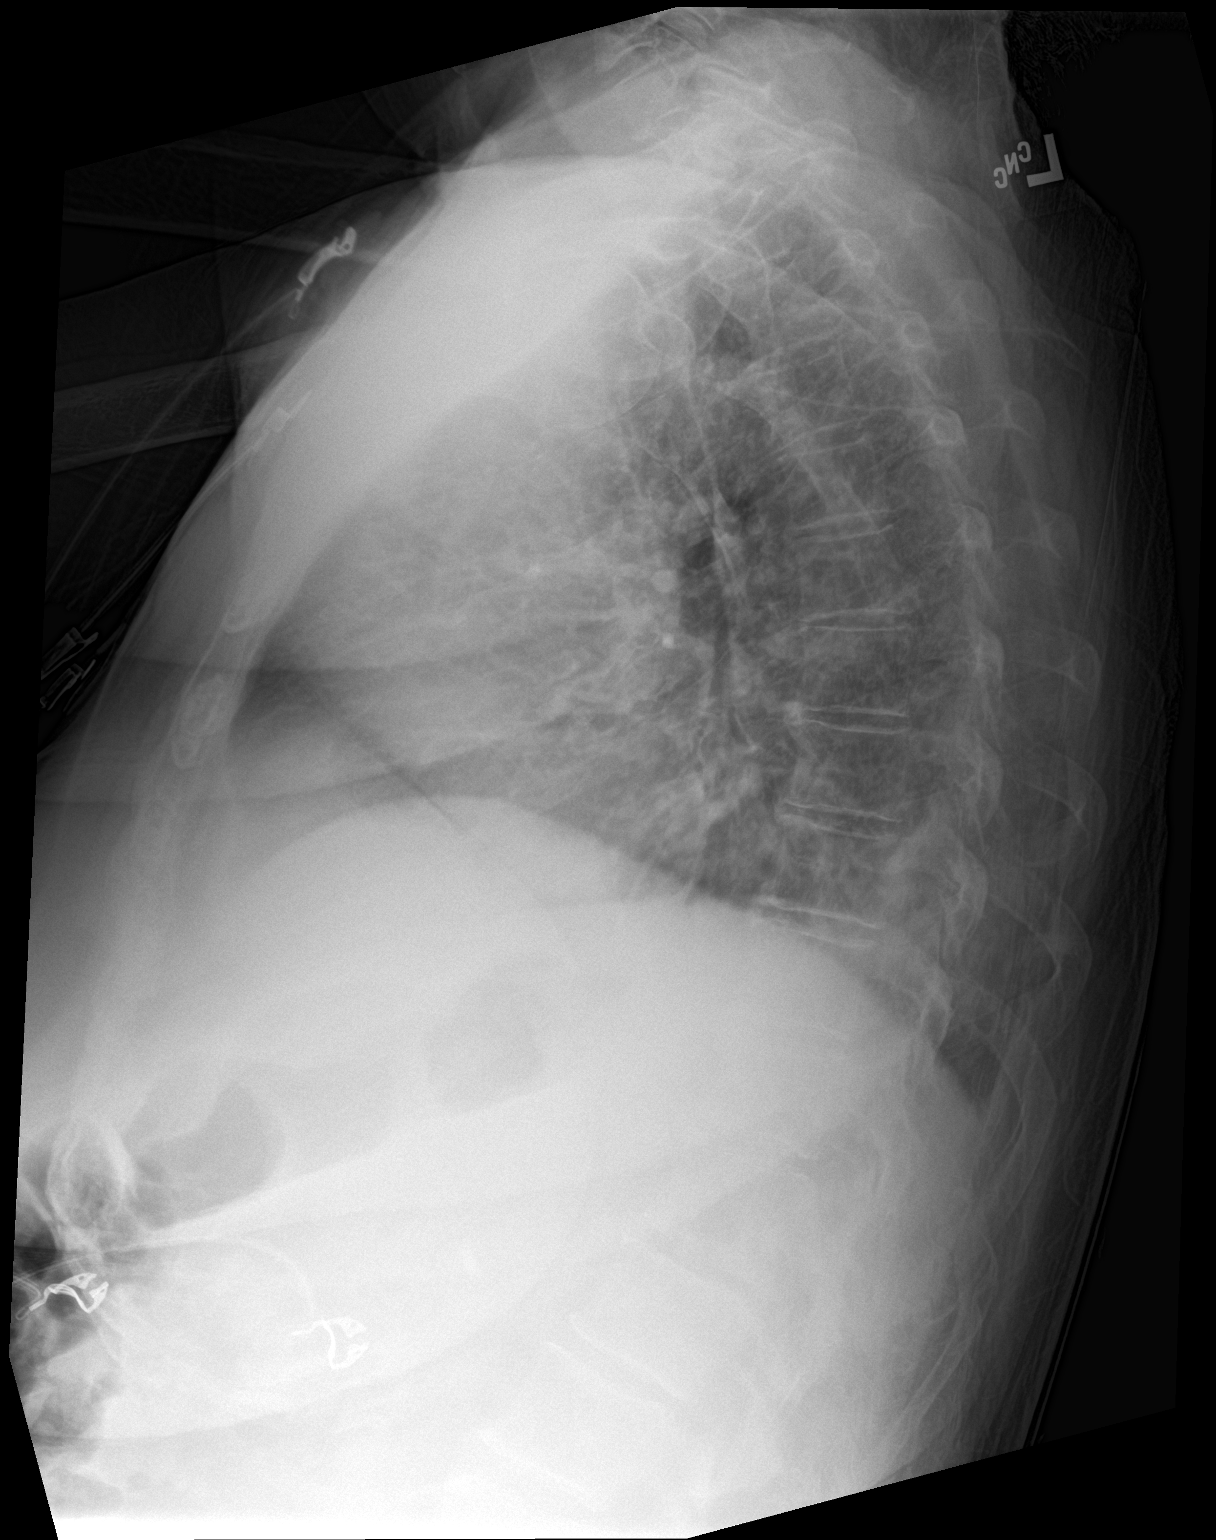
[im 2/2]
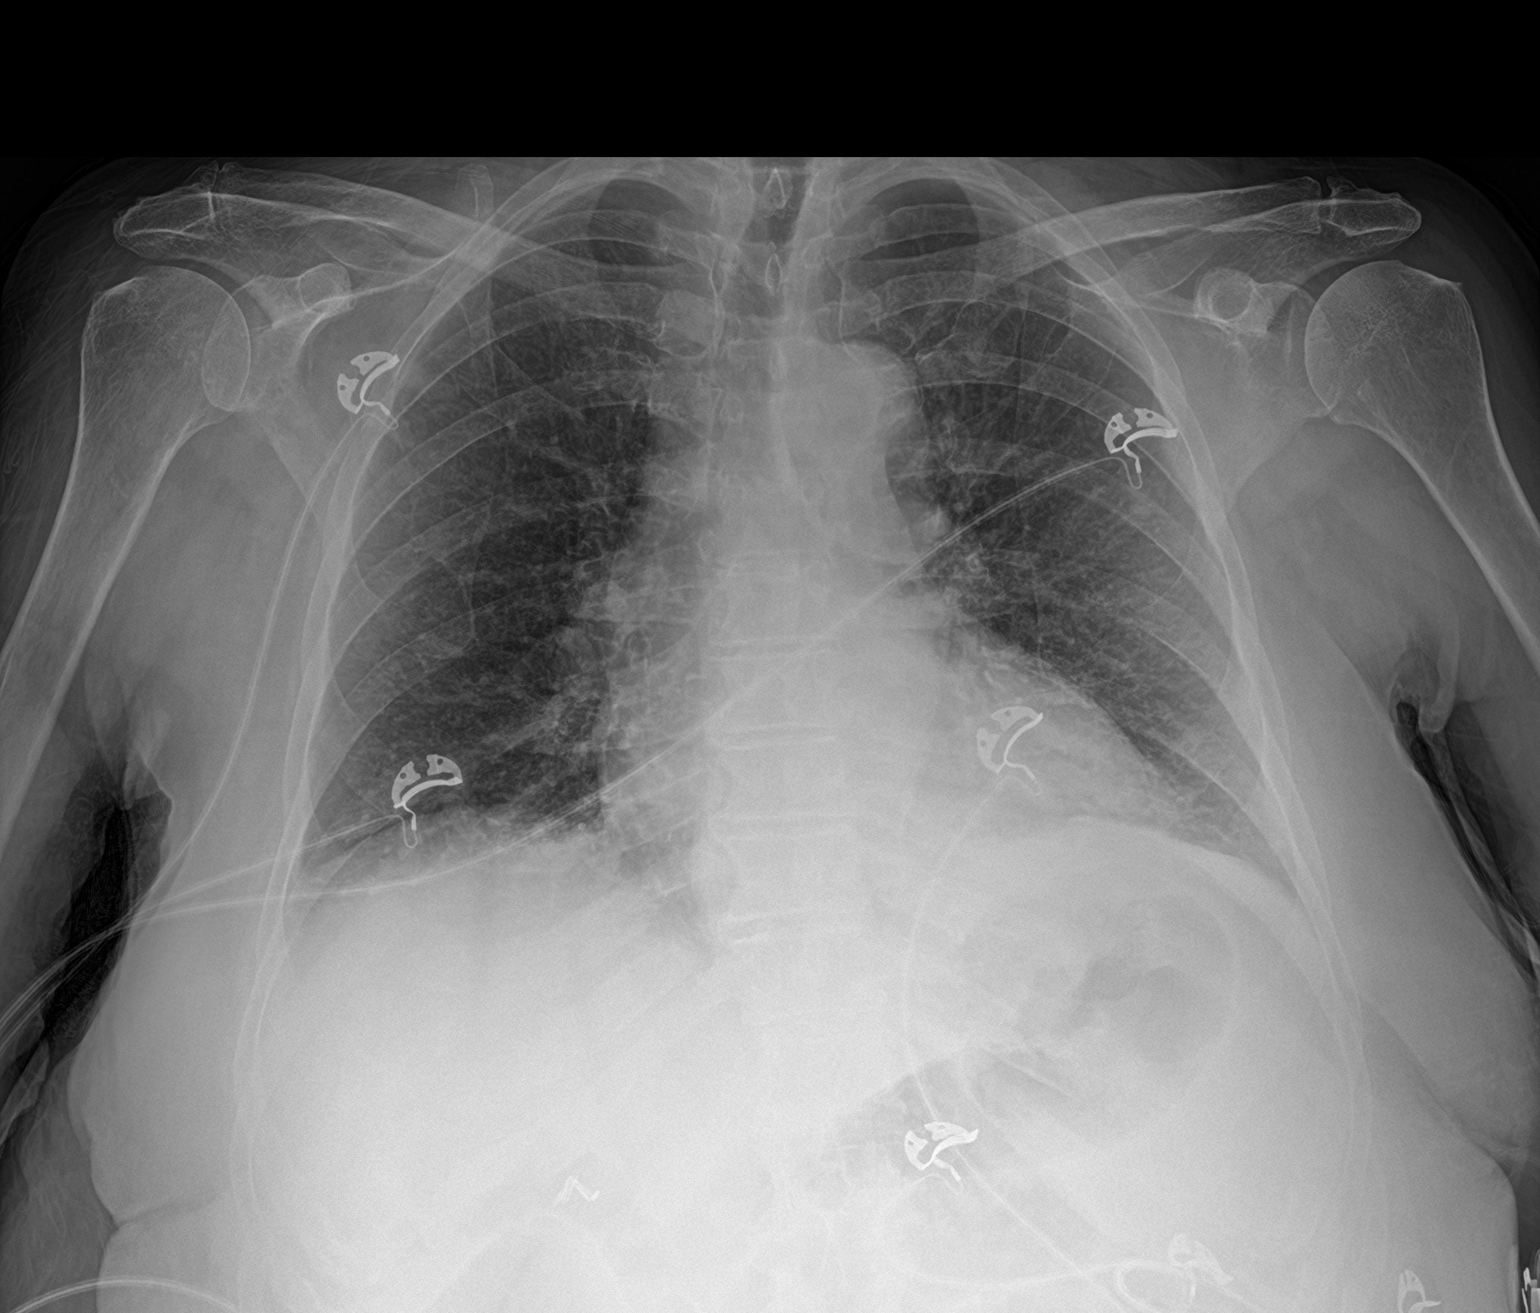

[2 of 2 positions shown; findings below may reference images not displayed]

FINDINGS: Interstitium is mildly thickened. No edema or airspace opacity.
Heart size and pulmonary vascularity are normal. No adenopathy. No
bone lesions. Surgical clips noted in gallbladder fossa region.
IMPRESSION: Interstitial thickening, likely indicative of underlying chronic
bronchitis. No edema or airspace opacity. Cardiac silhouette normal.

## 2021-03-11 IMAGING — CT CT HEAD W/O CM
3 series · 15 of 46 positions shown, 18 images · non-contrast
Comparison: [DATE]

CLINICAL DATA: Altered mental status/confusion

EXAM:
CT HEAD WITHOUT CONTRAST
TECHNIQUE: Contiguous axial images were obtained from the base of the skull
through the vertex without intravenous contrast.

[Series 2: head wo · axial · 0.40mm/px · z∈[-32,+88]mm · 9 of 29 slices shown, 12 images]
[im 3/29  brain]
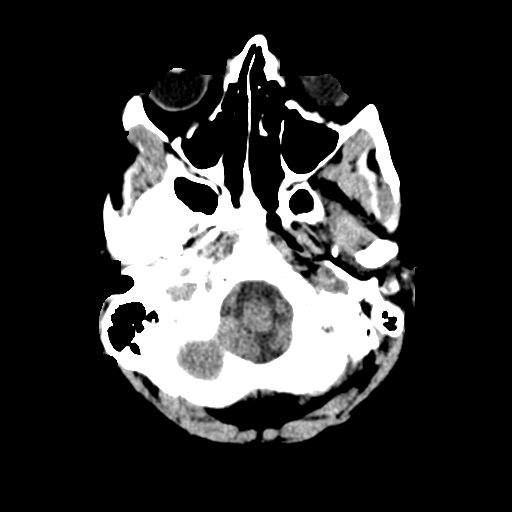
[im 3/29  bone]
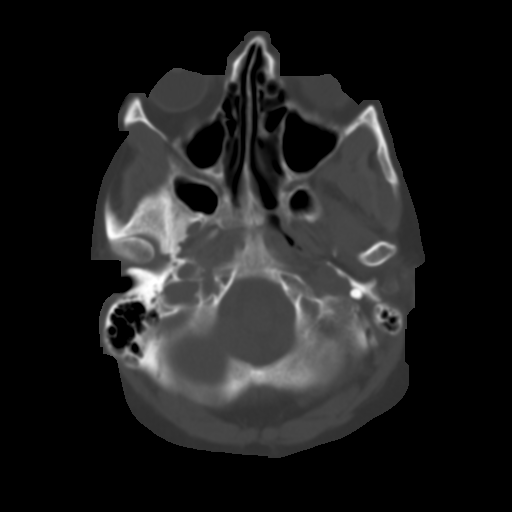
[im 6/29  brain]
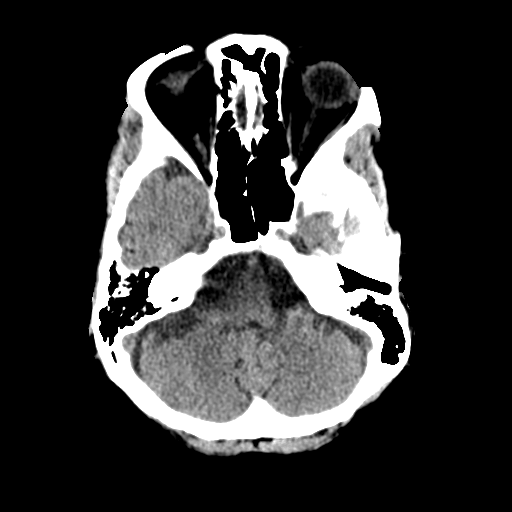
[im 9/29  brain]
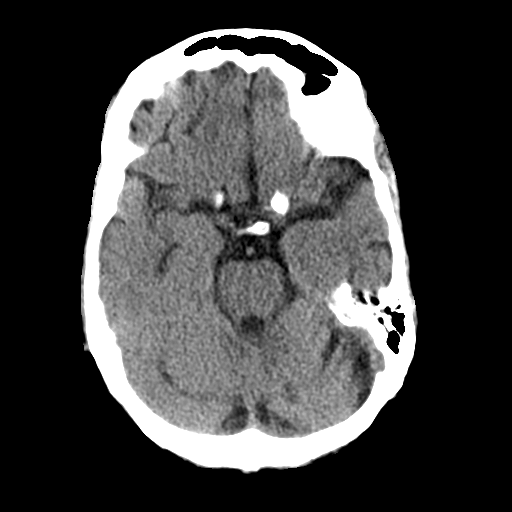
[im 12/29  brain]
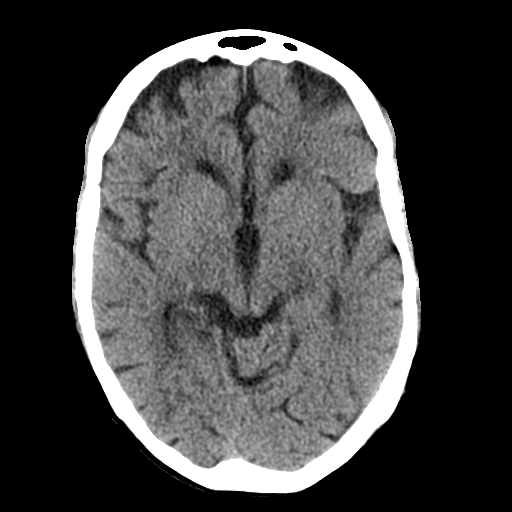
[im 15/29  brain]
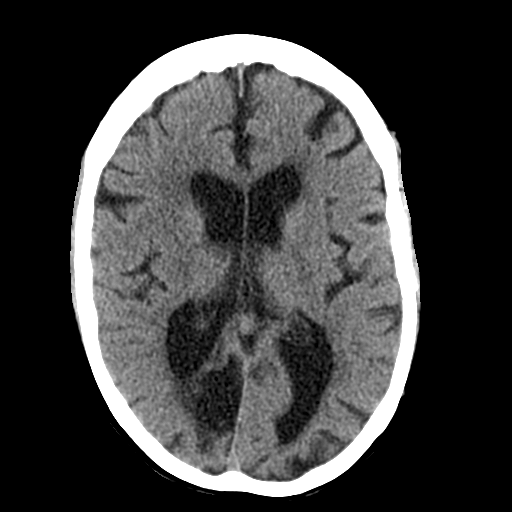
[im 15/29  bone]
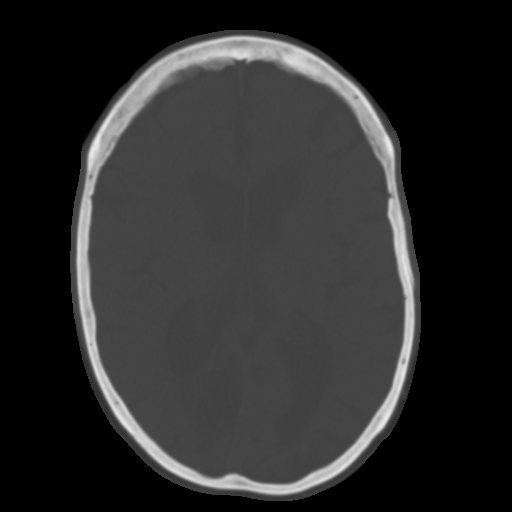
[im 18/29  brain]
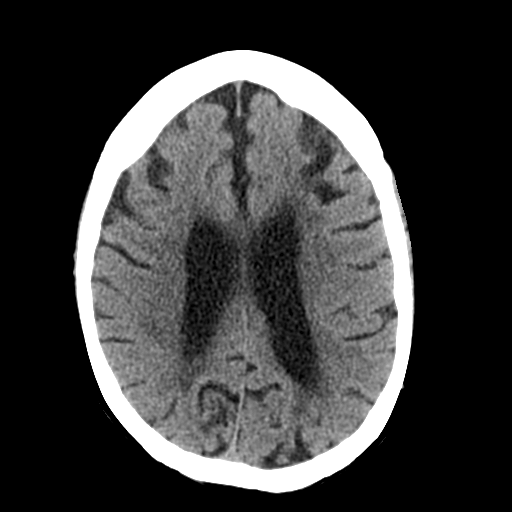
[im 21/29  brain]
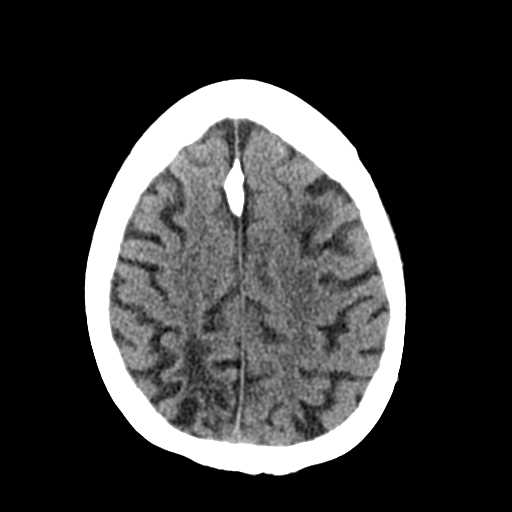
[im 24/29  brain]
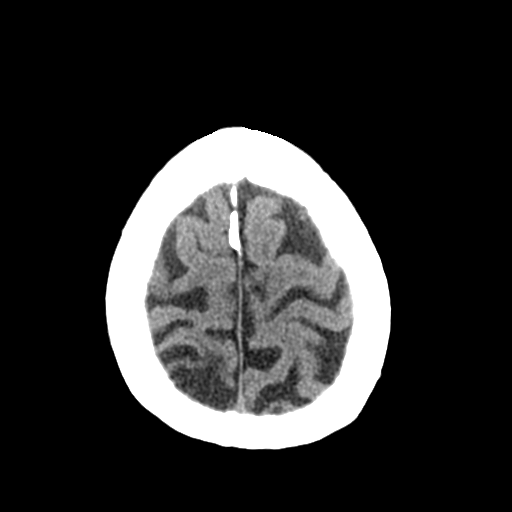
[im 27/29  brain]
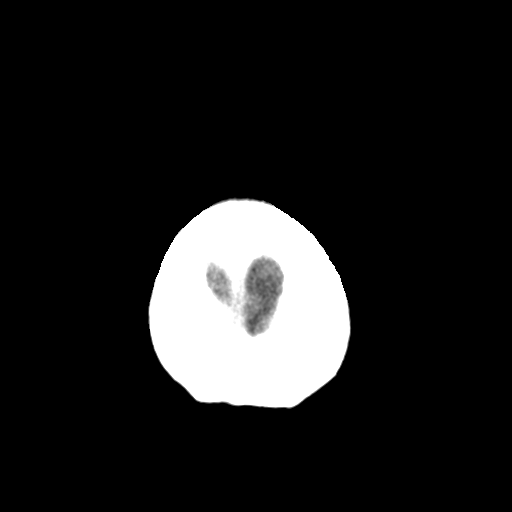
[im 27/29  bone]
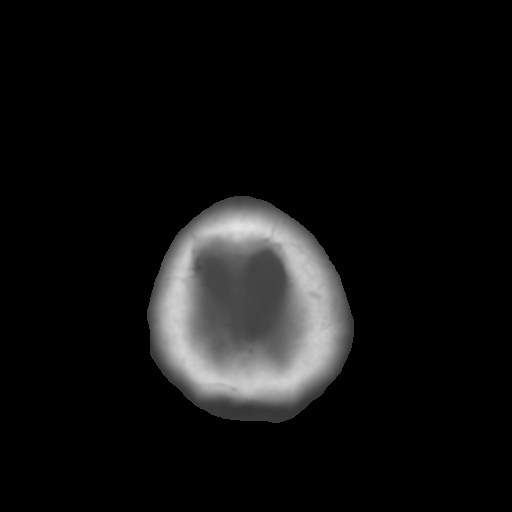

[Series 4: coronal soft tissue · coronal · 0.31mm/px · 3 of 66 slices shown]
[im 22/66  brain]
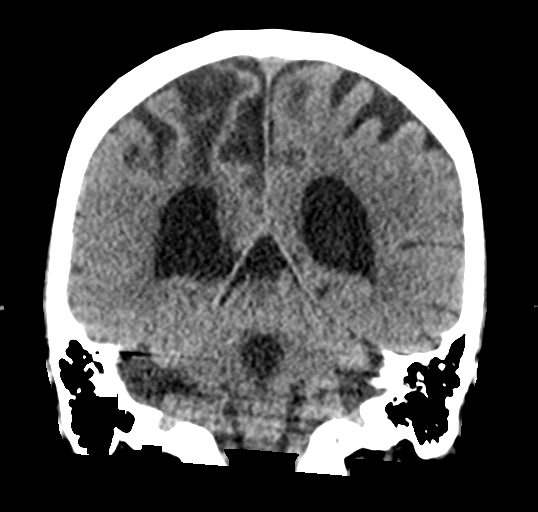
[im 29/66  brain]
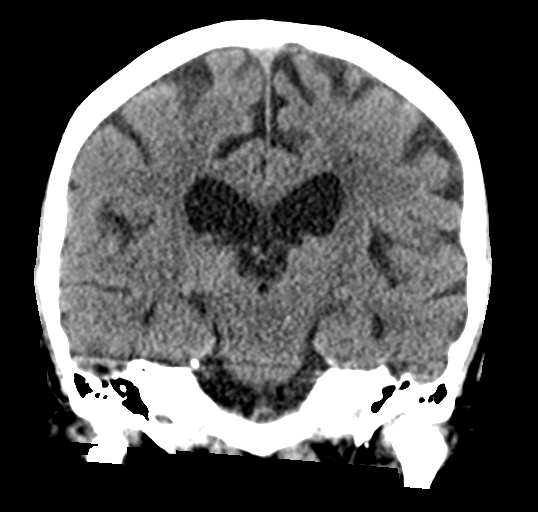
[im 37/66  brain]
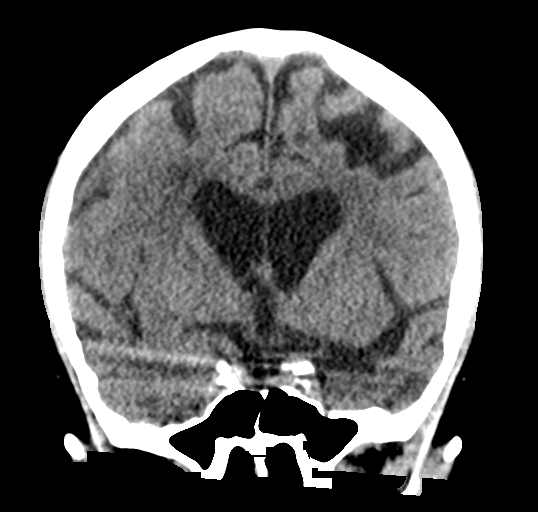

[Series 5: sagittal soft tissue · sagittal · 0.31mm/px · 3 of 56 slices shown]
[im 19/56  brain]
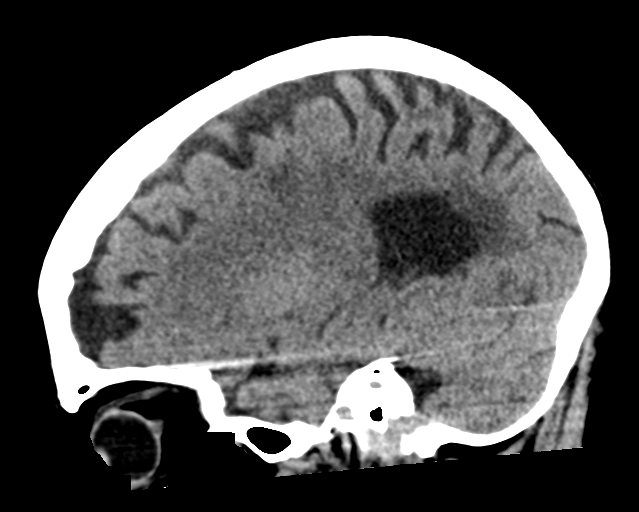
[im 28/56  brain]
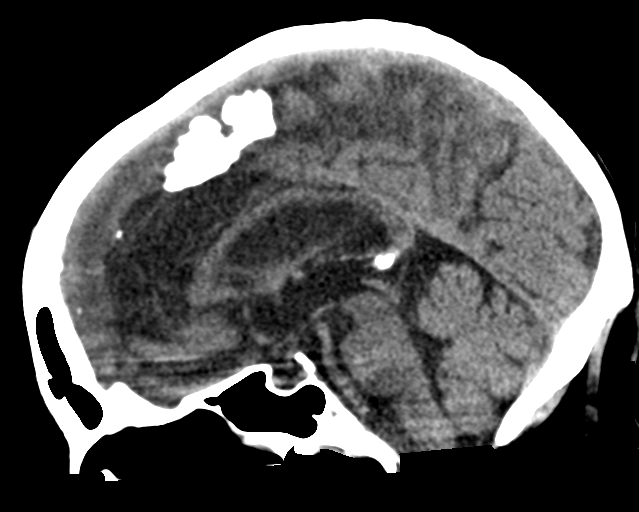
[im 37/56  brain]
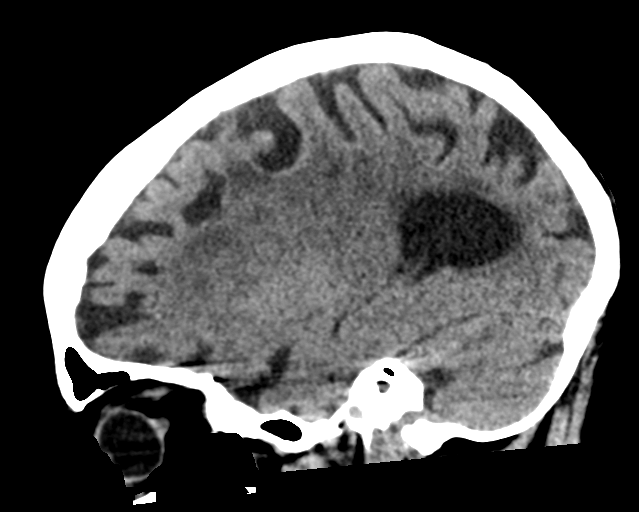

[15 of 46 positions shown; findings below may reference images not displayed]

FINDINGS: Brain: Moderate diffuse atrophy is stable. There is no intracranial
mass, hemorrhage, extra-axial fluid collection, or midline shift.
Decreased attenuation is noted with encephalomalacia in the medial
right occipital lobe consistent with prior infarct. Prior infarct
also noted in the posterosuperior right parietal lobe, stable. A
prior infarct is noted in the posterior aspect of the mid left
cerebellum, stable. There is mild decreased attenuation in portions
of the centra semiovale bilaterally. No acute appearing infarct is
evident.

Vascular: There is no hyperdense vessel. There is calcification in
each carotid siphon region.

Skull: The bony calvarium appears intact.

Sinuses/Orbits: There is slight mucosal thickening in several
ethmoid air cells. Visualized orbits appear symmetric bilaterally.

Other: Mastoid air cells are clear.
IMPRESSION: Stable atrophy. Prior infarcts in the medial right occipital lobe,
superior posterior right parietal lobe, and posterior mid left
cerebellum. Decreased attenuation in the periventricular white
matter is likely due to small vessel vascular disease, although a
mild degree of interstitial edema secondary to the ventricular
enlargement could present in this manner. No acute infarct evident.
No mass or hemorrhage.

There are foci of arterial vascular calcification. There is slight
mucosal thickening in several ethmoid air cells.

## 2021-03-11 IMAGING — CT CT RENAL STONE PROTOCOL
2 of 4 series · 15 of 46 positions shown, 17 images · non-contrast
Comparison: [DATE].

CLINICAL DATA: 69-year-old with chronic kidney disease, presenting
with a urinary tract infection.

EXAM:
CT ABDOMEN AND PELVIS WITHOUT CONTRAST
TECHNIQUE: Multidetector CT imaging of the abdomen and pelvis was performed
following the standard protocol without IV contrast.

[Series 2: stone full standard · axial · 0.68mm/px · z∈[-1106,-676]mm · 12 of 98 slices shown, 14 images]
[im 8/98  soft-tissue]
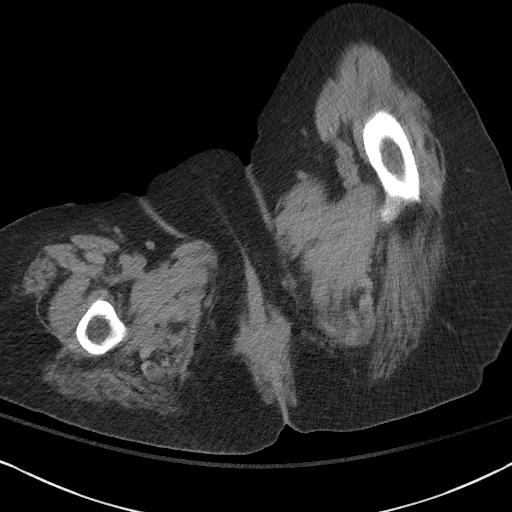
[im 8/98  bone]
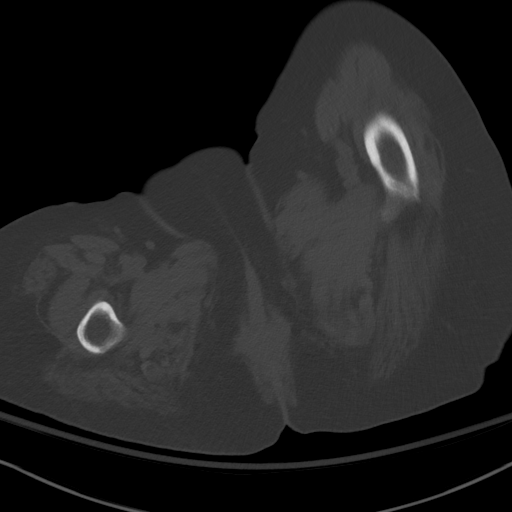
[im 16/98  soft-tissue]
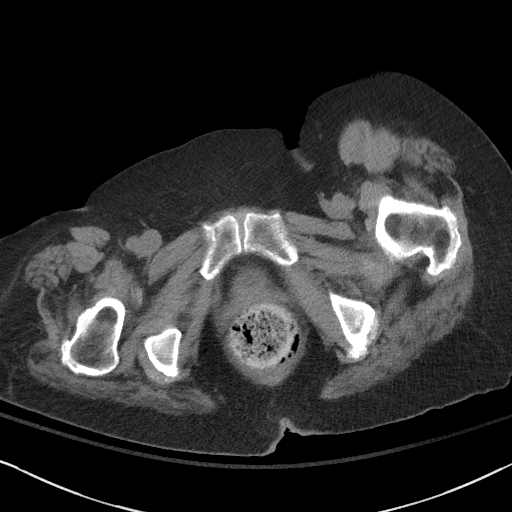
[im 24/98  soft-tissue]
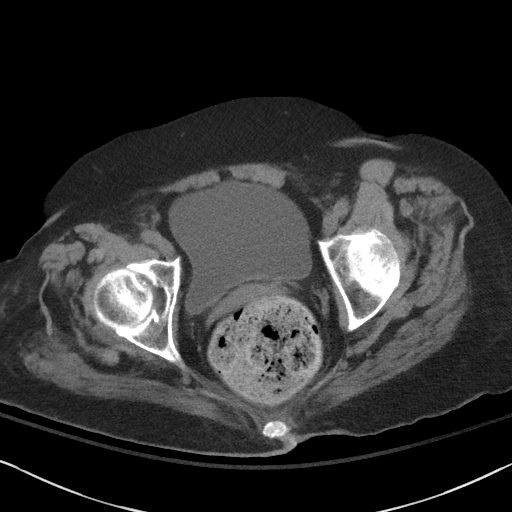
[im 32/98  soft-tissue]
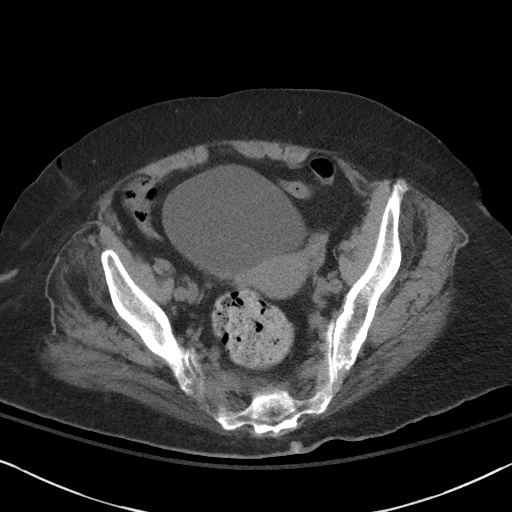
[im 39/98  soft-tissue]
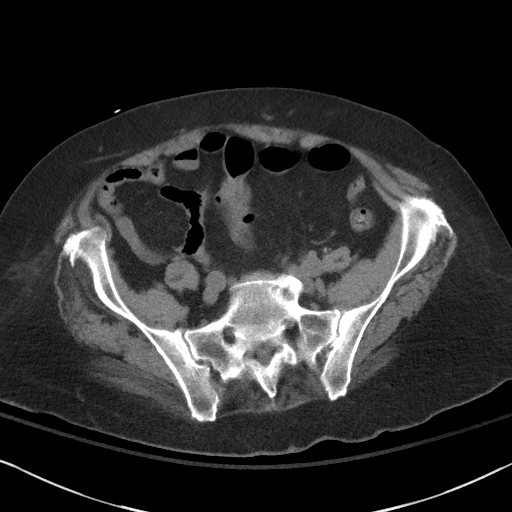
[im 47/98  soft-tissue]
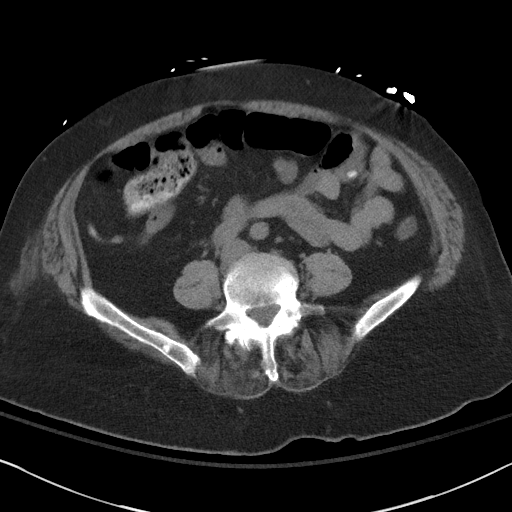
[im 55/98  soft-tissue]
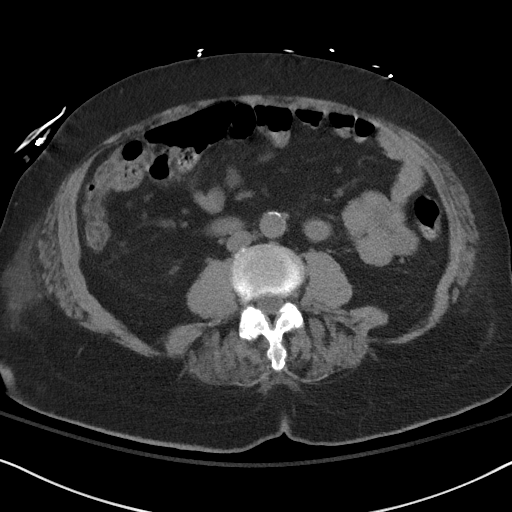
[im 63/98  soft-tissue]
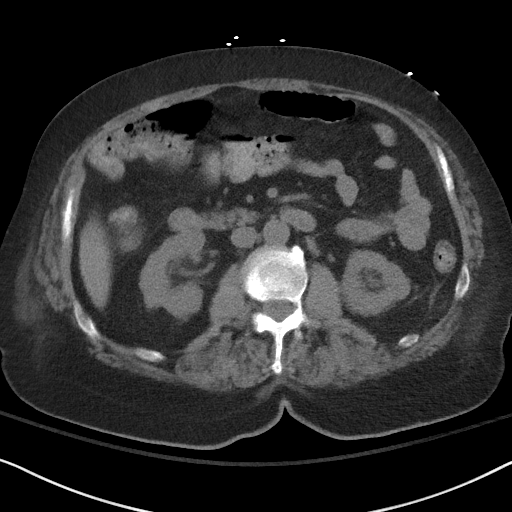
[im 70/98  soft-tissue]
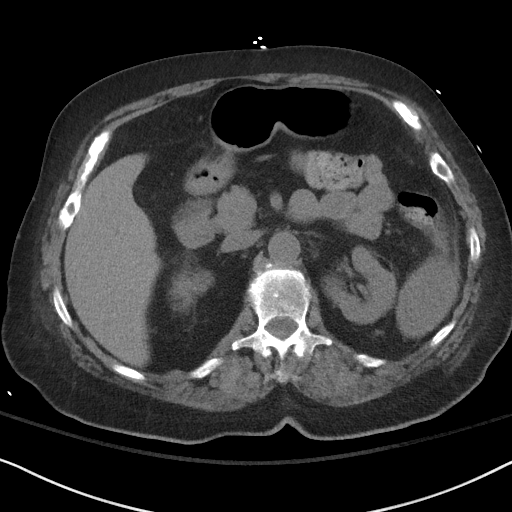
[im 70/98  bone]
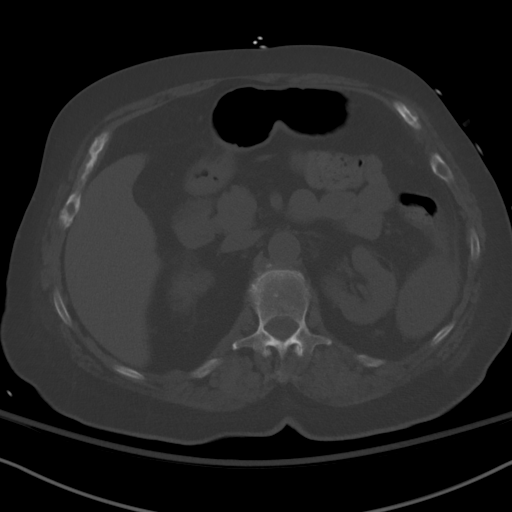
[im 78/98  soft-tissue]
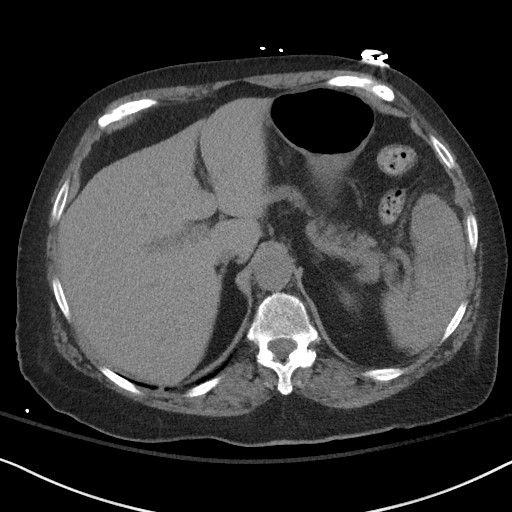
[im 86/98  soft-tissue]
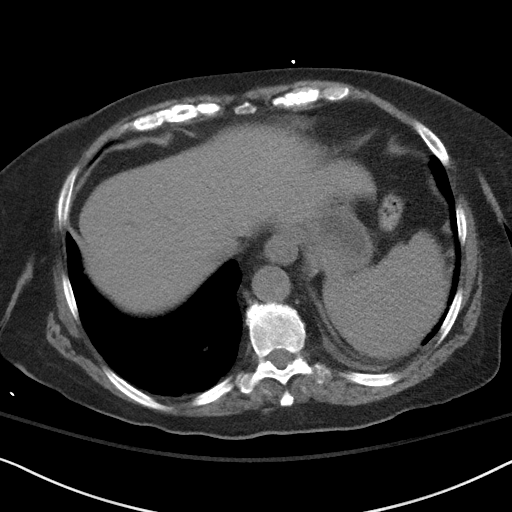
[im 94/98  soft-tissue]
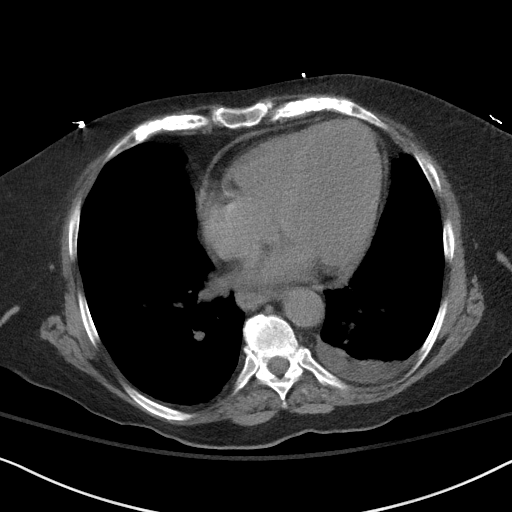

[Series 5: coronal · coronal · 0.68mm/px · 3 of 136 slices shown]
[im 46/136  soft-tissue]
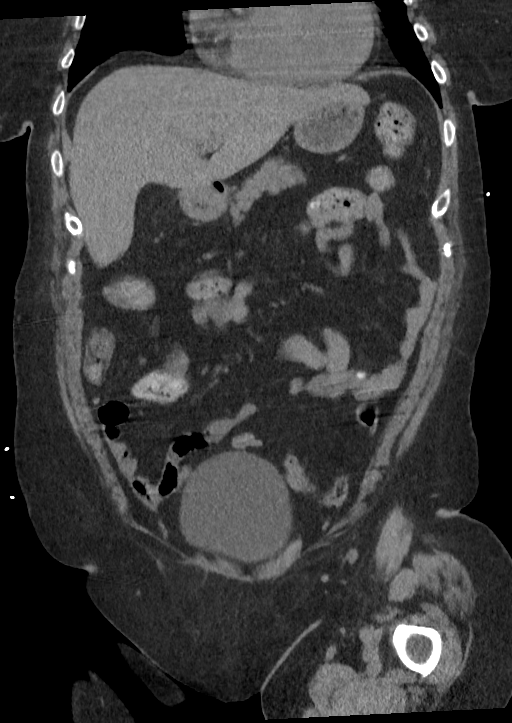
[im 61/136  soft-tissue]
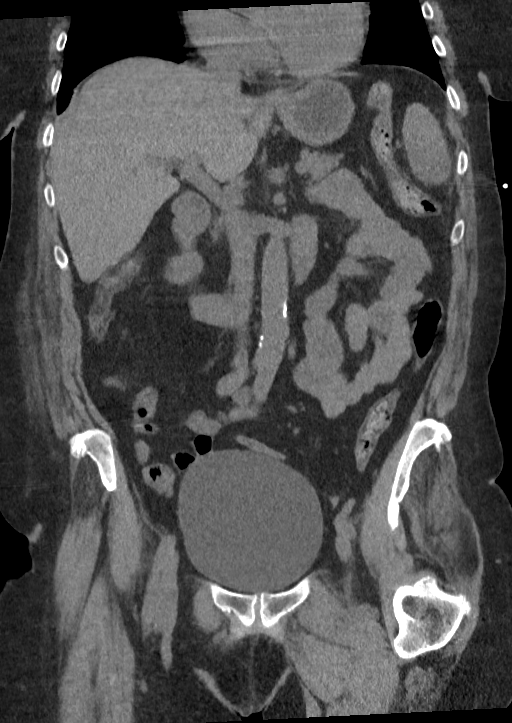
[im 76/136  soft-tissue]
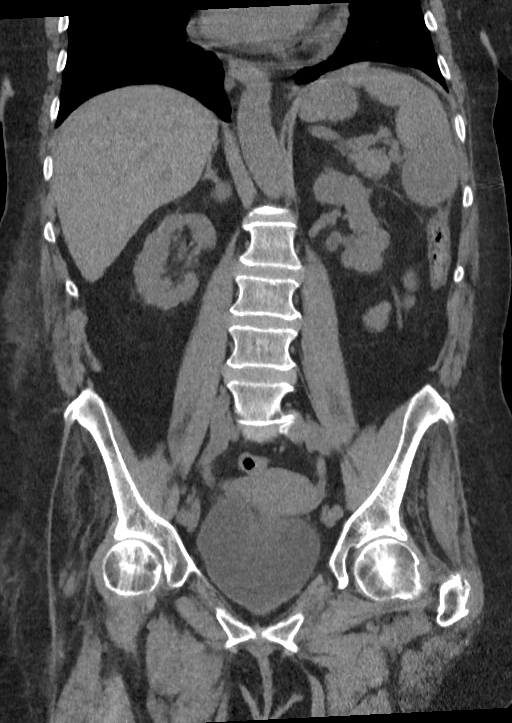

[15 of 46 positions shown; findings below may reference images not displayed]

FINDINGS: Lower chest: LEFT pleural effusion and associated mild passive
atelectasis in th, broad-based central disc protrusion and
multifactorial spinal stenosis at L4-5, and diffuse degenerative
changes e LEFT LOWER LOBE. Visualized lung bases otherwise clear.
Heart moderately enlarged. Small pericardial effusion versus
pericardial thickening.

Hepatobiliary: Normal unenhanced appearance of the liver. Surgically
absent gallbladder. No unexpected biliary ductal dilation.

Pancreas: Normal unenhanced appearance.

Spleen: Subtle low attenuation involving the lower pole of the
spleen, not present on the prior CT, with associated edema in the
perisplenic fat.

Adrenals/Urinary Tract: Normal appearing adrenal glands. Scarring
and mild cortical thinning involving both kidneys. Allowing for the
unenhanced technique, no significant focal parenchymal abnormalities
involving either kidney. No hydronephrosis. No urinary tract
calculi. Normal appearing mildly distended urinary bladder.

Stomach/Bowel: Stomach normal in appearance for the degree of
distention. Normal-appearing small bowel. Mobile cecum positioned in
the RIGHT UPPER QUADRANT. Large rectal colonic stool burden and
moderate colonic stool burden elsewhere. High attenuation ingested
material within the colon and in the normal appearing appendix
located in the RIGHT mid abdomen. No focal colonic abnormality.

Vascular/Lymphatic: Mild atherosclerosis involving the abdominal
aorta without evidence of aneurysm. No pathologic lymphadenopathy.

Reproductive: Normal-appearing uterus and ovaries without evidence
of adnexal mass.

Other: Mild edema in the abdominal wall overlying both flanks, right
greater than left.

Musculoskeletal: Degenerative disc disease and spondylosis at L5-S1,
broad-based central disc protrusion and multifactorial spinal
stenosis at L4-5 and diffuse facet degenerative changes throughout
the lumbar spine. No acute findings.
IMPRESSION: 1. Subtle low attenuation involving the lower pole of the spleen,
not present on the prior CT, with associated edema in the
perisplenic fat, possibly indicating splenic infarct or splenic
abscess. CT abdomen with contrast may be confirmatory. If the
patient's renal function does not allow intravenous contrast
administration, then ultrasound may be helpful in further
evaluation.
2. No acute abnormalities otherwise involving the abdomen or pelvis.
3. LEFT pleural effusion and associated mild passive atelectasis in
the LEFT LOWER LOBE.
4. Small pericardial effusion versus pericardial thickening.
5. Aortic Atherosclerosis ([64]-[64]).

## 2021-03-11 MED ORDER — MORPHINE SULFATE (PF) 2 MG/ML IV SOLN
1.0000 mg | INTRAVENOUS | Status: DC | PRN
  Administered 2021-03-11: 1 mg via INTRAVENOUS
  Filled 2021-03-11: qty 1

## 2021-03-11 MED ORDER — ORAL CARE MOUTH RINSE
15.0000 mL | Freq: Two times a day (BID) | OROMUCOSAL | Status: DC
  Administered 2021-03-11 – 2021-03-17 (×5): 15 mL via OROMUCOSAL

## 2021-03-11 MED ORDER — LEVOTHYROXINE SODIUM 100 MCG PO TABS
100.0000 ug | ORAL_TABLET | Freq: Every day | ORAL | Status: DC
  Administered 2021-03-13 – 2021-03-17 (×4): 100 ug via ORAL
  Filled 2021-03-11 (×5): qty 1

## 2021-03-11 MED ORDER — HEPARIN (PORCINE) 25000 UT/250ML-% IV SOLN
1000.0000 [IU]/h | INTRAVENOUS | Status: DC
  Administered 2021-03-11: 1000 [IU]/h via INTRAVENOUS
  Filled 2021-03-11: qty 250

## 2021-03-11 MED ORDER — ACETAMINOPHEN 500 MG PO TABS
1000.0000 mg | ORAL_TABLET | Freq: Once | ORAL | Status: AC
  Administered 2021-03-11: 1000 mg via ORAL
  Filled 2021-03-11: qty 2

## 2021-03-11 MED ORDER — FERROUS SULFATE 325 (65 FE) MG PO TABS
325.0000 mg | ORAL_TABLET | Freq: Every day | ORAL | Status: DC
  Administered 2021-03-11 – 2021-03-16 (×6): 325 mg via ORAL
  Filled 2021-03-11 (×7): qty 1

## 2021-03-11 MED ORDER — APIXABAN 5 MG PO TABS: 5.0000 mg | ORAL_TABLET | Freq: Two times a day (BID) | ORAL | Status: DC

## 2021-03-11 MED ORDER — MORPHINE SULFATE (PF) 4 MG/ML IV SOLN
4.0000 mg | Freq: Once | INTRAVENOUS | Status: AC
Start: 2021-03-11 — End: 2021-03-11
  Administered 2021-03-11: 4 mg via INTRAVENOUS
  Filled 2021-03-11: qty 1

## 2021-03-11 MED ORDER — MONTELUKAST SODIUM 10 MG PO TABS
10.0000 mg | ORAL_TABLET | Freq: Every day | ORAL | Status: DC
  Administered 2021-03-11 – 2021-03-16 (×6): 10 mg via ORAL
  Filled 2021-03-11 (×7): qty 1

## 2021-03-11 MED ORDER — ATORVASTATIN CALCIUM 20 MG PO TABS
20.0000 mg | ORAL_TABLET | Freq: Every day | ORAL | Status: DC
  Administered 2021-03-11 – 2021-03-16 (×7): 20 mg via ORAL
  Filled 2021-03-11 (×6): qty 1

## 2021-03-11 MED ORDER — LACTATED RINGERS IV SOLN: INTRAVENOUS | Status: AC

## 2021-03-11 MED ORDER — OXYCODONE HCL 5 MG PO TABS
5.0000 mg | ORAL_TABLET | Freq: Four times a day (QID) | ORAL | Status: DC | PRN
  Administered 2021-03-11: 5 mg via ORAL
  Filled 2021-03-11: qty 1

## 2021-03-11 MED ORDER — OXYCODONE HCL 5 MG PO TABS: 5.0000 mg | ORAL_TABLET | Freq: Four times a day (QID) | ORAL | Status: DC | PRN

## 2021-03-11 MED ORDER — OXYCODONE HCL 5 MG PO TABS
5.0000 mg | ORAL_TABLET | Freq: Once | ORAL | Status: AC
Start: 2021-03-11 — End: 2021-03-11
  Administered 2021-03-11: 5 mg via ORAL
  Filled 2021-03-11: qty 1

## 2021-03-11 MED ORDER — FAMOTIDINE 20 MG PO TABS
20.0000 mg | ORAL_TABLET | Freq: Every day | ORAL | Status: DC
  Administered 2021-03-11 – 2021-03-17 (×6): 20 mg via ORAL
  Filled 2021-03-11 (×6): qty 1

## 2021-03-11 MED ORDER — SODIUM CHLORIDE 0.9 % IV SOLN
500.0000 mg | INTRAVENOUS | Status: DC
  Administered 2021-03-12: 500 mg via INTRAVENOUS
  Filled 2021-03-11 (×2): qty 500

## 2021-03-11 MED ORDER — OXYBUTYNIN CHLORIDE ER 5 MG PO TB24
15.0000 mg | ORAL_TABLET | Freq: Every day | ORAL | Status: DC
  Administered 2021-03-11 – 2021-03-16 (×6): 15 mg via ORAL
  Filled 2021-03-11 (×8): qty 1

## 2021-03-11 MED ORDER — BUPROPION HCL ER (XL) 150 MG PO TB24
150.0000 mg | ORAL_TABLET | Freq: Every day | ORAL | Status: DC
  Administered 2021-03-11 – 2021-03-17 (×6): 150 mg via ORAL
  Filled 2021-03-11 (×6): qty 1

## 2021-03-11 MED ORDER — LACTATED RINGERS IV BOLUS
1000.0000 mL | Freq: Once | INTRAVENOUS | Status: AC
  Administered 2021-03-11: 1000 mL via INTRAVENOUS

## 2021-03-11 MED ORDER — OXYCODONE HCL 5 MG PO TABS
5.0000 mg | ORAL_TABLET | Freq: Three times a day (TID) | ORAL | Status: DC | PRN
  Administered 2021-03-12 (×2): 5 mg via ORAL
  Filled 2021-03-11 (×3): qty 1

## 2021-03-11 MED ORDER — VITAMIN B-12 1000 MCG PO TABS
1000.0000 ug | ORAL_TABLET | Freq: Every day | ORAL | Status: DC
  Administered 2021-03-11 – 2021-03-17 (×6): 1000 ug via ORAL
  Filled 2021-03-11 (×7): qty 1

## 2021-03-11 MED ORDER — MORPHINE SULFATE (PF) 2 MG/ML IV SOLN: 0.5000 mg | INTRAVENOUS | Status: DC | PRN

## 2021-03-11 MED ORDER — ACETAMINOPHEN 650 MG RE SUPP: 650.0000 mg | Freq: Four times a day (QID) | RECTAL | Status: AC | PRN

## 2021-03-11 MED ORDER — ONDANSETRON HCL 4 MG PO TABS: 4.0000 mg | ORAL_TABLET | Freq: Four times a day (QID) | ORAL | Status: DC | PRN

## 2021-03-11 MED ORDER — ONDANSETRON HCL 4 MG/2ML IJ SOLN: 4.0000 mg | Freq: Four times a day (QID) | INTRAMUSCULAR | Status: DC | PRN

## 2021-03-11 MED ORDER — HEPARIN BOLUS VIA INFUSION
4000.0000 [IU] | Freq: Once | INTRAVENOUS | Status: AC
  Administered 2021-03-11: 4000 [IU] via INTRAVENOUS
  Filled 2021-03-11: qty 4000

## 2021-03-11 MED ORDER — SODIUM CHLORIDE 0.9 % IV SOLN
1.0000 g | INTRAVENOUS | Status: DC
  Administered 2021-03-12 – 2021-03-13 (×2): 1 g via INTRAVENOUS
  Filled 2021-03-11 (×2): qty 10
  Filled 2021-03-11: qty 1

## 2021-03-11 MED ORDER — SODIUM CHLORIDE 0.9 % IV SOLN
1.0000 g | Freq: Once | INTRAVENOUS | Status: AC
  Administered 2021-03-11: 1 g via INTRAVENOUS
  Filled 2021-03-11: qty 10

## 2021-03-11 MED ORDER — ESCITALOPRAM OXALATE 20 MG PO TABS
20.0000 mg | ORAL_TABLET | Freq: Every day | ORAL | Status: DC
  Administered 2021-03-12 – 2021-03-17 (×5): 20 mg via ORAL
  Filled 2021-03-11 (×8): qty 1

## 2021-03-11 MED ORDER — MELATONIN 5 MG PO TABS
5.0000 mg | ORAL_TABLET | Freq: Every day | ORAL | Status: DC
  Administered 2021-03-11 – 2021-03-16 (×6): 5 mg via ORAL
  Filled 2021-03-11 (×7): qty 1

## 2021-03-11 MED ORDER — ADULT MULTIVITAMIN W/MINERALS CH
ORAL_TABLET | Freq: Every day | ORAL | Status: DC
  Administered 2021-03-11 – 2021-03-17 (×6): 1 via ORAL
  Filled 2021-03-11 (×6): qty 1

## 2021-03-11 MED ORDER — TRAZODONE HCL 50 MG PO TABS
100.0000 mg | ORAL_TABLET | Freq: Every evening | ORAL | Status: DC | PRN
  Administered 2021-03-12 – 2021-03-16 (×4): 100 mg via ORAL
  Filled 2021-03-11 (×5): qty 2

## 2021-03-11 MED ORDER — ACETAMINOPHEN 500 MG PO TABS
1000.0000 mg | ORAL_TABLET | Freq: Four times a day (QID) | ORAL | Status: AC | PRN
  Administered 2021-03-11 – 2021-03-13 (×4): 1000 mg via ORAL
  Filled 2021-03-11 (×4): qty 2

## 2021-03-11 NOTE — Consult Note (Signed)
[facility] for Heparin Indication: chest pain/ACS  Allergies  Allergen Reactions  . Penicillin G Rash    Patient Measurements: Height: 5\' 8"  (172.7 cm) Weight: 83.5 kg (184 lb) IBW/kg (Calculated) : 63.9 Heparin Dosing Weight: 81 kg  Vital Signs: Temp: 97.7 F (36.5 C) (04/09 1410) Temp Source: Oral (04/09 1410) BP: 115/69 (04/09 1330) Pulse Rate: 85 (04/09 1330)  Labs: Recent Labs    03/11/21 0926 03/11/21 1323  HGB 8.6*  --   HCT 26.4*  --   PLT 104*  --   CREATININE 2.03*  --   TROPONINIHS  --  543*    Estimated Creatinine Clearance: 29.6 mL/min (A) (by C-G formula based on SCr of 2.03 mg/dL (H)).   Medical History: Past Medical History:  Diagnosis Date  . Arthritis 04/02/1996  . Depression   . GERD (gastroesophageal reflux disease) 04/03/2019  . Hyperlipidemia   . Hypertension   . Renal insufficiency   . Stroke ([place])   . Thyroid disease     Medications:  (Not in a hospital admission)  Scheduled:  . atorvastatin  20 mg Oral Daily  . buPROPion  150 mg Oral Daily  . Centrum Silver 50+Women   Oral Daily  . escitalopram  20 mg Oral Daily  . famotidine  20 mg Oral Daily  . ferrous sulfate  325 mg Oral QHS  . [START ON 03/12/2021] levothyroxine  100 mcg Oral Q0600  . melatonin  5 mg Oral QHS  . montelukast  10 mg Oral QHS  . oxybutynin  15 mg Oral QHS  . vitamin B-12  1,000 mcg Oral Daily   Infusions:   PRN: acetaminophen **OR** acetaminophen, ondansetron **OR** ondansetron (ZOFRAN) IV, traZODone Anti-infectives (From admission, onward)   Start     Dose/Rate Route Frequency Ordered Stop   03/11/21 1100  cefTRIAXone (ROCEPHIN) 1 g in sodium chloride 0.9 % 100 mL IVPB        1 g 200 mL/hr over 30 Minutes Intravenous  Once 03/11/21 1056 03/11/21 1131      Assessment: Pharmacy consulted for ACS. Trop elevated at 543. On apixaban PTA. Last dose 4/8 per med rec. Heparin level, aptt, and INR ordered.   Goal of  Therapy:  Heparin level 0.3-0.7 units/ml once aPTT and heparin level correlate.  aPTT 66-102 seconds Monitor platelets by anticoagulation protocol: Yes   Plan:  Give 4000 units bolus x 1 Start heparin infusion at 1000 units/hr Check aPTT level in 8 hours and daily while on heparin Continue to monitor H&H and platelets  Oswald Hillock, PharmD, BCPS 03/11/2021,2:12 PM

## 2021-03-11 NOTE — ED Notes (Signed)
Patient transported to X-ray 

## 2021-03-11 NOTE — ED Notes (Signed)
Patient transported to CT 

## 2021-03-11 NOTE — ED Notes (Signed)
Troponin 543 per lab, [facility] DO made aware.

## 2021-03-11 NOTE — ED Triage Notes (Signed)
Pt in via [facility] from [facility], per EMS, pt with intermittent confusion.  Family requests that she be evaluated for possible UTI due to recent diagnosis of the same.  Pt A/Ox4 at this time, vitals WDL,  NAD noted.

## 2021-03-11 NOTE — H&P (Addendum)
History and Physical   [name] SHF:[id number] DOB: [birth year] DOA: 03/11/2021  PCP: Kathryn Patch, MD  Patient coming from: [name]  I have personally briefly reviewed patient's old medical records in [name].  Chief Concern: Confusion  HPI: [name] is a 70 y.o. female with medical history significant for hypertension, history of right trimalleoli are fracture, hypothyroid, history of DVT and antiphospholipid antibody syndrome previously on Coumadin, CKD 3B, hyperlipidemia, depression, mild cognitive decline, history of CVA, right PCA infarct, presents emergency department for chief concerns of confusion.  She presents from [name], for chief concern of intermittent confusion, for three weeks and worse int he last 2-3 days.   Ms. [name] endorsed chest pain, described as chest pressure, like someone sitting on her chest, daughter states lasting about 2 hours.  Patient had reported this for over two days, on and off lasting minutes and now the chest pressure has worsened to lasting two hours.   Daughter denies report of fever, vomiting. Daughter endorses patient's poor PO intake for the last 3 weeks.   At bedside, patient was able to tell me her full name, identify her daugther at bedside, states the current year was 2012 initially and with promting states it is 2022. States her age is 69, 64, 83. States [name] is president  Daughter expresses wish to move patient to another facility on discharge.  Social history: patient previously lived with spouse. [facility] are now [facility]  ROS: unable to complete due to confusion  ED Course: Discussed with emergency medicine provider, patient required hospitalization due to altered mental status and acute kidney injury.  Vitals in the emergency department was remarkable for temperature of 98.4, respiration rate of 20, heart rate of 78, blood pressure 154/82, patient saturation at 100% on  room air.  Labs in the emergency department was remarkable for sodium level 132, potassium 3.9, chloride 101, bicarb 25, BUN 35, serum creatinine of 2.03, nonfasting blood glucose of 112, WBC 8.5, hemoglobin 8.6, platelets of 104.  Covid test is pending.  UA was positive for large leukocytes.  CT of the head without contrast was read as stable atrophy.  Prior infarcts in the medial right occipital lobe, superior posterior right parietal lobe, posterior mid left cerebellum.  Decreased attenuation in the periventricular white matter.  Likely due to mild small vessel disease.  No acute infarct evident.  No mass Le hemorrhage.  There is a foci of arterial vascular calcification.  There is slight mucosal thickening in several ethmoid air cells.  Assessment/Plan  Active Problems:   Adult hypothyroidism   Essential hypertension   Anticoagulant long-term use   Antiphospholipid antibody syndrome (HCC)   Stage 3 chronic kidney disease (HCC)   Thrombocytopenia (HCC)   CVA (cerebral vascular accident) ([name])   Ischemic cerebrovascular accident (CVA) of frontal lobe (HCC)   Embolism of left anterior cerebral artery   AKI (acute kidney injury) ([name])   # Altered mental status - B12 deficiency, progression of CVA, cardiac, sinusitis, pna -Check B12, ammonia -Checking troponin, BNP, pro-Cal -Fall precautions  # Query pneumonia-azithromycin and ceftriaxone initiated -Pro-Cal for 2 days ordered  # Chest pressure - we will check troponin HS and trend # NSTEMI -Check BNP and troponin -[name] has been consulted by myself via secure chat and he will see the patient -Update: troponin is trending up, heparin gtt started by myself  # Acute kidney injury on CKD 3B, likely prerenal multifactorial including  poor p.o. intake, versus cardiac etiology -Status post lactated ringer 1 L bolus per ED provider -Ordered lactated Ringer's 100 mL/h, 10 hours ordered -BMP in the a.m.  # Confusion with verbal  yelling at night - this has been consistent and worsening since she has been at the rehab facility -Suspect sun-downing -Would recommend patient have family member at bedside overnight to prevent onset of acute delirium and sundowning -Fall precautions  # Thrombocytopenia-chronic, at baseline  # Acute on chronic anemia-etiology work-up in progress # Anemia of chronic disease-Baseline hemoglobin is 9.7-10.7 in 2022 -Hemoglobin on admission is 8.6, no overt signs of bleeding at this time and not indicated for transfusion -CBC in the a.m.  # Hypothyroid-resumed home levothyroxine 100 mcg daily # Hyperlipidemia-atorvastatin 20 mg daily  # Depression/anxiety-bupropion 1 50 mg p.o. daily, Lexapro 20 mg p.o. daily  # Insomnia-patient takes trazodone 100 mg daily, given the patient's altered I have changed this to trazodone oral nightly as needed for sleep -Melatonin 5 mg nightly scheduled  # Trimalleolar ankle fracture-cast in place, [name] was consulted at the time and states that the fracture is nonoperable -Pain control with oxycodone 5 mg every 8 hours as needed for moderate pain and morphine 0.5 mg every 2 hours for severe pain  # History of stroke, atrial fibrillation, antiphospholipid antibody positive-Eliquis 5 mg twice daily  Covid test is pending  Chart reviewed.   Complete cardiac echo ordered on 12/27/2020: Read as left ventricular ejection fraction estimated at 55 to 60%, left ventricle has normal function, left ventricle has no regional wall abnormalities, left ventricular diastolic parameters are consistent with grade 1 diastolic dysfunction.  DVT prophylaxis: Eliquis 5 mg BID Code Status: DNR Diet: heart healthy  Family Communication: discussed and updated daughter at bedside extensively, [name] and again over the phone Disposition Plan: pending clinical course Consults called: Cardiology Admission status: Med-surg, observation, with telemetry    Past Medical  History:  Diagnosis Date  . Arthritis 04/02/1996  . Depression   . GERD (gastroesophageal reflux disease) 04/03/2019  . Hyperlipidemia   . Hypertension   . Renal insufficiency   . Stroke ([place])   . Thyroid disease    Past Surgical History:  Procedure Laterality Date  . CESAREAN SECTION    . CHOLECYSTECTOMY    . COLONOSCOPY  2012   repeat in 10 yrs  . KNEE ARTHROSCOPY W/ MENISCAL REPAIR Left    Social History:  reports that she has never smoked. She has never used smokeless tobacco. She reports current alcohol use of about 1.0 standard drink of alcohol per week. She reports that she does not use drugs.  Allergies  Allergen Reactions  . Penicillin G Rash   Family History  Problem Relation Age of Onset  . Stroke Father   . Breast cancer Maternal Grandmother   . Kidney cancer Mother   . Cancer Mother   . Arthritis Brother    Family history: Family history reviewed and not pertinent  Prior to Admission medications   Medication Sig Start Date End Date Taking? Authorizing Provider  acetaminophen (TYLENOL) 325 MG tablet Take 2 tablets (650 mg total) by mouth every 4 (four) hours as needed for mild pain (Le temp > 37.5 C (99.5 F)). 12/29/20   Lorella Nimrod, MD  acetaminophen (TYLENOL) 325 MG tablet Take 2 tablets (650 mg total) by mouth every 4 (four) hours as needed for mild pain (Le temp > 37.5 C (99.5 F)). 01/19/21   Angiulli, Lavon Paganini, PA-C  apixaban (ELIQUIS) 5 MG TABS tablet Take 1 tablet (5 mg total) by mouth 2 (two) times daily. 01/19/21   Angiulli, Lavon Paganini, PA-C  atorvastatin (LIPITOR) 20 MG tablet Take 1 tablet (20 mg total) by mouth daily. 01/20/21   Angiulli, Lavon Paganini, PA-C  buPROPion (WELLBUTRIN XL) 150 MG 24 hr tablet Take 1 tablet (150 mg total) by mouth daily. 02/08/21   [facility] Patch, MD  diclofenac Sodium (VOLTAREN) 1 % GEL Apply 2 g topically 4 (four) times daily as needed (left knee pain). 01/19/21   Angiulli, Lavon Paganini, PA-C  donepezil (ARICEPT) 10 MG tablet Take 1  tablet by mouth daily. 10/31/20 11/04/21  [provider]  escitalopram (LEXAPRO) 20 MG tablet Take 1 tablet (20 mg total) by mouth daily. 12/12/20   Kathryn Patch, MD  famotidine (PEPCID) 20 MG tablet Take 1 tablet by mouth daily. singh 09/29/19   [provider]  ferrous sulfate 325 (65 FE) MG EC tablet Take 1 tablet (325 mg total) by mouth daily with breakfast. Patient taking differently: Take 325 mg by mouth at bedtime. 12/12/20   [facility]ryn Patch, MD  levothyroxine (SYNTHROID) 100 MCG tablet Take 1 tablet (100 mcg total) by mouth daily at 6 (six) AM. 12/30/20   Lorella Nimrod, MD  loperamide (IMODIUM) 2 MG capsule Take 1 capsule (2 mg total) by mouth every 6 (six) hours as needed for diarrhea Le loose stools. 12/29/20   Lorella Nimrod, MD  montelukast (SINGULAIR) 10 MG tablet TAKE 1 TABLET BY MOUTH EVERYDAY AT BEDTIME 03/11/21   [facility] Patch, MD  Multiple Vitamins-Minerals (CENTRUM SILVER 50+WOMEN PO) Take 1 tablet by mouth daily.    [provider]  neomycin-bacitracin-polymyxin (NEOSPORIN) OINT Apply 1 application topically 2 (two) times daily. 01/19/21   Angiulli, Lavon Paganini, PA-C  oxybutynin (DITROPAN XL) 15 MG 24 hr tablet Take 1 tablet (15 mg total) by mouth at bedtime. 01/19/21   Angiulli, Lavon Paganini, PA-C  traZODone (DESYREL) 100 MG tablet Take 1 tablet (100 mg total) by mouth at bedtime. 01/19/21   Angiulli, Lavon Paganini, PA-C  vitamin B-12 (CYANOCOBALAMIN) 1000 MCG tablet Take 1,000 mcg by mouth daily.    [provider]   Physical Exam: Vitals:   03/11/21 1330 03/11/21 1400 03/11/21 1410 03/11/21 1450  BP: 115/69 (!) 148/71  (!) 151/81  Pulse: 85 74  83  Resp: 15 14  16   Temp:   97.7 F (36.5 C) 97.6 F (36.4 C)  TempSrc:   Oral Oral  SpO2: 100% 99%  100%  Weight:      Height:       Constitutional: appears age appropriate, NAD, calm, comfortable Eyes: PERRL, lids and conjunctivae normal ENMT: Mucous membranes are moist. Posterior pharynx clear of any  exudate Le lesions. Age-appropriate dentition. Hearing appropriate Neck: normal, supple, no masses, no thyromegaly Respiratory: clear to auscultation bilaterally, no wheezing, no crackles. Normal respiratory effort. No accessory muscle use.  Cardiovascular: Regular rate and rhythm, no murmurs / rubs / gallops. No extremity edema. 2+ pedal pulses. No carotid bruits.  Abdomen: Obese abdomen, no tenderness, no masses palpated, no hepatosplenomegaly. Bowel sounds positive.  Musculoskeletal: no clubbing / cyanosis. No joint deformity upper and lower extremities. Good ROM, no contractures, no atrophy. Normal muscle tone.  Skin: no rashes, lesions, ulcers. No induration Neurologic: Sensation intact. Strength 5/5 in all 4.  Psychiatric: Normal judgment and insight. Alert and oriented x self, daughter, current president, and ultimately currently year. Normal mood.  EKG: independently reviewed, showing normal sinus rhythm, rate of 94, qtc 476  Chest x-ray on Admission: I personally reviewed and I agree with radiologist reading as below.  DG Chest 2 View  Result Date: 03/11/2021 CLINICAL DATA:  Confusion EXAM: CHEST - 2 VIEW COMPARISON:  January 08, 2021 FINDINGS: Interstitium is mildly thickened. No edema Le airspace opacity. Heart size and pulmonary vascularity are normal. No adenopathy. No bone lesions. Surgical clips noted in gallbladder fossa region. IMPRESSION: Interstitial thickening, likely indicative of underlying chronic bronchitis. No edema Le airspace opacity. Cardiac silhouette normal. Electronically Signed   By: Lowella Grip III M.D.   On: 03/11/2021 09:56   CT Head Wo Contrast  Result Date: 03/11/2021 CLINICAL DATA:  Altered mental status/confusion EXAM: CT HEAD WITHOUT CONTRAST TECHNIQUE: Contiguous axial images were obtained from the base of the skull through the vertex without intravenous contrast. COMPARISON:  February 22, 2021 FINDINGS: Brain: Moderate diffuse atrophy is stable. There  is no intracranial mass, hemorrhage, extra-axial fluid collection, Le midline shift. Decreased attenuation is noted with encephalomalacia in the medial right occipital lobe consistent with prior infarct. Prior infarct also noted in the posterosuperior right parietal lobe, stable. A prior infarct is noted in the posterior aspect of the mid left cerebellum, stable. There is mild decreased attenuation in portions of the centra semiovale bilaterally. No acute appearing infarct is evident. Vascular: There is no hyperdense vessel. There is calcification in each carotid siphon region. Skull: The bony calvarium appears intact. Sinuses/Orbits: There is slight mucosal thickening in several ethmoid air cells. Visualized orbits appear symmetric bilaterally. Other: Mastoid air cells are clear. IMPRESSION: Stable atrophy. Prior infarcts in the medial right occipital lobe, superior posterior right parietal lobe, and posterior mid left cerebellum. Decreased attenuation in the periventricular white matter is likely due to small vessel vascular disease, although a mild degree of interstitial edema secondary to the ventricular enlargement could present in this manner. No acute infarct evident. No mass Le hemorrhage. There are foci of arterial vascular calcification. There is slight mucosal thickening in several ethmoid air cells. Electronically Signed   By: Lowella Grip III M.D.   On: 03/11/2021 10:00   CT Renal Stone Study  Result Date: 03/11/2021 CLINICAL DATA:  70 year old with chronic kidney disease, presenting with a urinary tract infection. EXAM: CT ABDOMEN AND PELVIS WITHOUT CONTRAST TECHNIQUE: Multidetector CT imaging of the abdomen and pelvis was performed following the standard protocol without IV contrast. COMPARISON:  01/09/2021. FINDINGS: Lower chest: LEFT pleural effusion and associated mild passive atelectasis in th, broad-based central disc protrusion and multifactorial spinal stenosis at L4-5, and diffuse  degenerative changes e LEFT LOWER LOBE. Visualized lung bases otherwise clear. Heart moderately enlarged. Small pericardial effusion versus pericardial thickening. Hepatobiliary: Normal unenhanced appearance of the liver. Surgically absent gallbladder. No unexpected biliary ductal dilation. Pancreas: Normal unenhanced appearance. Spleen: Subtle low attenuation involving the lower pole of the spleen, not present on the prior CT, with associated edema in the perisplenic fat. Adrenals/Urinary Tract: Normal appearing adrenal glands. Scarring and mild cortical thinning involving both kidneys. Allowing for the unenhanced technique, no significant focal parenchymal abnormalities involving either kidney. No hydronephrosis. No urinary tract calculi. Normal appearing mildly distended urinary bladder. Stomach/Bowel: Stomach normal in appearance for the degree of distention. Normal-appearing small bowel. Mobile cecum positioned in the RIGHT UPPER QUADRANT. Large rectal colonic stool burden and moderate colonic stool burden elsewhere. High attenuation ingested material within the colon and in the normal appearing appendix located in the RIGHT  mid abdomen. No focal colonic abnormality. Vascular/Lymphatic: Mild atherosclerosis involving the abdominal aorta without evidence of aneurysm. No pathologic lymphadenopathy. Reproductive: Normal-appearing uterus and ovaries without evidence of adnexal mass. Other: Mild edema in the abdominal wall overlying both flanks, right greater than left. Musculoskeletal: Degenerative disc disease and spondylosis at L5-S1, broad-based central disc protrusion and multifactorial spinal stenosis at L4-5 and diffuse facet degenerative changes throughout the lumbar spine. No acute findings. IMPRESSION: 1. Subtle low attenuation involving the lower pole of the spleen, not present on the prior CT, with associated edema in the perisplenic fat, possibly indicating splenic infarct Le splenic abscess. CT abdomen  with contrast may be confirmatory. If the patient's renal function does not allow intravenous contrast administration, then ultrasound may be helpful in further evaluation. 2. No acute abnormalities otherwise involving the abdomen Le pelvis. 3. LEFT pleural effusion and associated mild passive atelectasis in the LEFT LOWER LOBE. 4. Small pericardial effusion versus pericardial thickening. 5. Aortic Atherosclerosis (ICD10-I70.0). Electronically Signed   By: Evangeline Dakin M.D.   On: 03/11/2021 12:12   Labs on Admission: I have personally reviewed following labs  CBC: Recent Labs  Lab 03/11/21 0926  WBC 8.5  NEUTROABS 6.3  HGB 8.6*  HCT 26.4*  MCV 94.6  PLT 272*   Basic Metabolic Panel: Recent Labs  Lab 03/11/21 0926  NA 132*  K 3.9  CL 101  CO2 25  GLUCOSE 112*  BUN 35*  CREATININE 2.03*  CALCIUM 10.0   GFR: Estimated Creatinine Clearance: 29.6 mL/min (A) (by C-G formula based on SCr of 2.03 mg/dL (H)).  Liver Function Tests: Recent Labs  Lab 03/11/21 0926  AST 46*  ALT 44  ALKPHOS 262*  BILITOT 0.9  PROT 7.2  ALBUMIN 2.7*   Urine analysis:    Component Value Date/Time   COLORURINE YELLOW (A) 03/11/2021 0926   APPEARANCEUR CLOUDY (A) 03/11/2021 0926   APPEARANCEUR Clear 04/23/2012 2341   LABSPEC 1.008 03/11/2021 0926   LABSPEC 1.021 04/23/2012 2341   PHURINE 6.0 03/11/2021 0926   GLUCOSEU NEGATIVE 03/11/2021 0926   GLUCOSEU Negative 04/23/2012 2341   HGBUR MODERATE (A) 03/11/2021 0926   BILIRUBINUR NEGATIVE 03/11/2021 0926   BILIRUBINUR negative 10/15/2017 1622   BILIRUBINUR Negative 04/23/2012 2341   KETONESUR NEGATIVE 03/11/2021 0926   PROTEINUR NEGATIVE 03/11/2021 0926   UROBILINOGEN 0.2 10/15/2017 1622   NITRITE NEGATIVE 03/11/2021 0926   LEUKOCYTESUR LARGE (A) 03/11/2021 0926   LEUKOCYTESUR Negative 04/23/2012 2341   CRITICAL CARE Performed by: Briant Cedar Graylee Arutyunyan  Total critical care time: 35 minutes  Critical care time was exclusive of separately  billable procedures and treating other patients.  Critical care was necessary to treat Le prevent imminent Le life-threatening deterioration. NSTEMI, circulatory failure  Critical care was time spent personally by me on the following activities: development of treatment plan with patient and/Le surrogate as well as nursing, discussions with consultants, evaluation of patient's response to treatment, examination of patient, obtaining history from patient Le surrogate, ordering and performing treatments and interventions, ordering and review of laboratory studies, ordering and review of radiographic studies, pulse oximetry and re-evaluation of patient's condition.  Tryson Lumley N Shelsey Rieth D.O. [facility]  If 7PM-7AM, please contact overnight-coverage provider If 7AM-7PM, please contact day coverage provider [email or web address]  03/11/2021, 6:45 PM

## 2021-03-11 NOTE — Telephone Encounter (Signed)
Requested Prescriptions  Pending Prescriptions Disp Refills  . montelukast (SINGULAIR) 10 MG tablet [Pharmacy Med Name: MONTELUKAST SOD 10 MG TABLET] 90 tablet 3    Sig: TAKE 1 TABLET BY MOUTH EVERYDAY AT BEDTIME     Pulmonology:  Leukotriene Inhibitors Passed - 03/11/2021  8:57 AM      Passed - Valid encounter within last 12 months    Recent Outpatient Visits          2 months ago Antiphospholipid antibody syndrome ([place])   [facility] Juline Patch, MD   9 months ago Mild memory disturbance   [facility] Juline Patch, MD   9 months ago Cellulitis of right lower extremity   [facility] Juline Patch, MD   10 months ago Anticoagulant long-term use   [facility] Juline Patch, MD   10 months ago Cellulitis of right lower extremity   [facility] Juline Patch, MD

## 2021-03-11 NOTE — ED Provider Notes (Signed)
[facility] Emergency Department Provider Note ____________________________________________   Event Date/Time   First MD Initiated Contact with Patient 03/11/21 [phone]     (approximate)  I have reviewed the triage vital signs and the nursing notes.  HISTORY  Chief Complaint Altered Mental Status   HPI [name] is a 70 y.o. femalewho presents to the ED for evaluation of a possible UTI.   Chart review indicates patient was seen in our ED on 3/23 due to a fall causing right ankle injury, found to have a trimalleolar fracture, managed nonoperatively as an outpatient with orthopedics..  Otherwise recent history of stroke with admission from 1/27-2/18 while on Coumadin and was transitioned to Eliquis. She was treated for UTI while she was admitted with cefepime -> Bactrim.  Urine culture results with Citrobacter resistant to first generation cephalosporin, otherwise sensitive. History of CKD, DVT and antiphospholipid antibody syndrome, HTN, HLD and hypothyroidism.  Cognitive impairments on Aricept and she resides at a local SNF, [facility].    Daughter provides majority of history and reports that patient was treated for another UTI last week, she reports 3 days of Keflex, finishing 3 days ago.  But despite this patient has been increasingly confused over the past 1 week.  Daughter reports there was a "drop off" of patient's functionality and mentation in the past month or so, but the past week has been remarkably worse.  She reports concern for increased confusion, agitation and requiring frequent redirection.   Patient reports left inguinal pain with left leg flexion as her only complaint.  She denies LLQ abdominal pain or left inguinal pain when she is not flexing her leg.  History limited due to patient's confusion.  Past Medical History:  Diagnosis Date  . Arthritis 04/02/1996  . Depression   . GERD (gastroesophageal reflux disease) 04/03/2019  .  Hyperlipidemia   . Hypertension   . Renal insufficiency   . Stroke ([place])   . Thyroid disease     Patient Active Problem List   Diagnosis Date Noted  . AKI (acute kidney injury) ([place]) 03/11/2021  . Fever   . Acute blood loss anemia   . Neurogenic bladder   . 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 Ischemic cerebrovascular accident (CVA) of frontal lobe ([place]) 12/29/2020  . CVA (cerebral vascular accident) ([place]) 12/27/2020  . Acute CVA (cerebrovascular accident) ([place]) 12/26/2020  . Thrombocytopenia ([place]) 12/26/2020  . Antiphospholipid antibody syndrome ([place]) 02/10/2020  . Acute kidney failure ([place]) 11/04/2019  . Edema of lower extremity 11/04/2019  . Stage 3 chronic kidney disease ([place]) 11/04/2019  . Popliteal bursitis of left knee 08/25/2019  . Familial hypercholesterolemia 08/25/2019  . Renal insufficiency 08/25/2019  . Low hemoglobin 08/25/2019  . History of essential hypertension 08/25/2019  . Seasonal allergic rhinitis due to pollen 02/24/2019  . Age-related osteoporosis without current pathological fracture 01/20/2018  . Osteoporosis 11/12/2017  . Urinary incontinence 05/28/2017  . Knee pain 05/08/2017  . Muscle weakness 05/08/2017  . Sprain of MCL (medial collateral ligament) of knee 05/08/2017  . Adult hypothyroidism 10/23/2016  . Essential hypertension 10/23/2016  . Gastroesophageal reflux disease without esophagitis 10/23/2016  . Recurrent major depressive disorder, in full remission ([place]) 10/23/2016  . Anticoagulant long-term use 10/23/2016    Past Surgical History:  Procedure Laterality Date  . CESAREAN SECTION    . CHOLECYSTECTOMY    . COLONOSCOPY  2012   repeat in 10 yrs  . KNEE ARTHROSCOPY W/ MENISCAL REPAIR  Left     Prior to Admission medications   Medication Sig Start Date End Date Taking? Authorizing Provider  acetaminophen (TYLENOL) 325 MG tablet Take 2 tablets (650 mg total) by mouth every 4 (four) hours as needed for mild pain (or  temp > 37.5 C (99.5 F)). 12/29/20   Lorella Nimrod, MD  acetaminophen (TYLENOL) 325 MG tablet Take 2 tablets (650 mg total) by mouth every 4 (four) hours as needed for mild pain (or temp > 37.5 C (99.5 F)). 01/19/21   Angiulli, Lavon Paganini, PA-C  apixaban (ELIQUIS) 5 MG TABS tablet Take 1 tablet (5 mg total) by mouth 2 (two) times daily. 01/19/21   Angiulli, Lavon Paganini, PA-C  atorvastatin (LIPITOR) 20 MG tablet Take 1 tablet (20 mg total) by mouth daily. 01/20/21   Angiulli, Lavon Paganini, PA-C  buPROPion (WELLBUTRIN XL) 150 MG 24 hr tablet Take 1 tablet (150 mg total) by mouth daily. 02/08/21   Juline Patch, MD  diclofenac Sodium (VOLTAREN) 1 % GEL Apply 2 g topically 4 (four) times daily as needed (left knee pain). 01/19/21   Angiulli, Lavon Paganini, PA-C  donepezil (ARICEPT) 10 MG tablet Take 1 tablet by mouth daily. 10/31/20 11/04/21  [provider]  escitalopram (LEXAPRO) 20 MG tablet Take 1 tablet (20 mg total) by mouth daily. 12/12/20   Juline Patch, MD  famotidine (PEPCID) 20 MG tablet Take 1 tablet by mouth daily. singh 09/29/19   [provider]  ferrous sulfate 325 (65 FE) MG EC tablet Take 1 tablet (325 mg total) by mouth daily with breakfast. Patient taking differently: Take 325 mg by mouth at bedtime. 12/12/20   Juline Patch, MD  levothyroxine (SYNTHROID) 100 MCG tablet Take 1 tablet (100 mcg total) by mouth daily at 6 (six) AM. 12/30/20   Lorella Nimrod, MD  loperamide (IMODIUM) 2 MG capsule Take 1 capsule (2 mg total) by mouth every 6 (six) hours as needed for diarrhea or loose stools. 12/29/20   Lorella Nimrod, MD  montelukast (SINGULAIR) 10 MG tablet TAKE 1 TABLET BY MOUTH EVERYDAY AT BEDTIME 03/11/21   Juline Patch, MD  Multiple Vitamins-Minerals (CENTRUM SILVER 50+WOMEN PO) Take 1 tablet by mouth daily.    [provider]  neomycin-bacitracin-polymyxin (NEOSPORIN) OINT Apply 1 application topically 2 (two) times daily. 01/19/21   Angiulli, Lavon Paganini, PA-C  oxybutynin  (DITROPAN XL) 15 MG 24 hr tablet Take 1 tablet (15 mg total) by mouth at bedtime. 01/19/21   Angiulli, Lavon Paganini, PA-C  traZODone (DESYREL) 100 MG tablet Take 1 tablet (100 mg total) by mouth at bedtime. 01/19/21   Angiulli, Lavon Paganini, PA-C  vitamin B-12 (CYANOCOBALAMIN) 1000 MCG tablet Take 1,000 mcg by mouth daily.    [provider]    Allergies Penicillin g  Family History  Problem Relation Age of Onset  . Stroke Father   . Breast cancer Maternal Grandmother   . Kidney cancer Mother   . Cancer Mother   . Arthritis Brother     Social History Social History   Tobacco Use  . Smoking status: Never Smoker  . Smokeless tobacco: Never Used  . Tobacco comment: none  Vaping Use  . Vaping Use: Never used  Substance Use Topics  . Alcohol use: Yes    Alcohol/week: 1.0 standard drink    Types: 1 Glasses of wine per week    Comment: occasional drink  . Drug use: No    Review of Systems  Unable to  be accurately assessed due to patient's confusion and disorientation ____________________________________________   PHYSICAL EXAM:  VITAL SIGNS: Vitals:   03/11/21 1300 03/11/21 1315  BP: 128/60   Pulse: 80 69  Resp: (!) 22 20  Temp:    SpO2: 100% 99%    Constitutional: Alert and pleasantly disoriented.  Oriented to self and location only.. Well appearing and in no acute distress.  Follows commands in all 4 without difficulty or apparent deficit. Eyes: Conjunctivae are normal. PERRL. EOMI. Head: Atraumatic. Nose: No congestion/rhinnorhea. Mouth/Throat: Mucous membranes are moist.  Oropharynx non-erythematous. Neck: No stridor. No cervical spine tenderness to palpation. Cardiovascular: Normal rate, regular rhythm. Grossly normal heart sounds.  Good peripheral circulation. Respiratory: Normal respiratory effort.  No retractions. Lungs CTAB. Gastrointestinal: Soft , nondistended, nontender to palpation. No CVA tenderness. Most tender to the suprapubic midline lower  abdomen.  Mild tenderness to the RLQ and LLQ, and nontender upper abdomen.  No peritoneal features throughout. Musculoskeletal: No lower extremity tenderness nor edema.  No joint effusions.  Right leg is casted in a purple cast.  Toes extend beyond this with brisk capillary refill in all toes.  Sensation intact and strength apparently intact with toes wiggling. No tenderness to palpation of the left leg, some mild tenderness to her left inguinal canal without skin changes, masses, fluctuance or induration.  She claims pain with left hip flexion.  Palpation of upper extremities without evidence of deformity, tenderness or trauma. Neurologic:  Normal speech and language. No acute focal neurologic deficits are appreciated.  Baseline blindness to the left eye from 2004 stroke, otherwise unable to appreciate any deficits. Skin:  Skin is warm, dry and intact. No rash noted. Psychiatric: Mood and affect are normal. Speech and behavior are normal. ____________________________________________   LABS (all labs ordered are listed, but only abnormal results are displayed)  Labs Reviewed  COMPREHENSIVE METABOLIC PANEL - Abnormal; Notable for the following components:      Result Value   Sodium 132 (*)    Glucose, Bld 112 (*)    BUN 35 (*)    Creatinine, Ser 2.03 (*)    Albumin 2.7 (*)    AST 46 (*)    Alkaline Phosphatase 262 (*)    GFR, Estimated 26 (*)    All other components within normal limits  CBC WITH DIFFERENTIAL/PLATELET - Abnormal; Notable for the following components:   RBC 2.79 (*)    Hemoglobin 8.6 (*)    HCT 26.4 (*)    Platelets 104 (*)    Abs Immature Granulocytes 0.16 (*)    All other components within normal limits  URINALYSIS, COMPLETE (UACMP) WITH MICROSCOPIC - Abnormal; Notable for the following components:   Color, Urine YELLOW (*)    APPearance CLOUDY (*)    Hgb urine dipstick MODERATE (*)    Leukocytes,Ua LARGE (*)    WBC, UA >50 (*)    Bacteria, UA RARE (*)    All  other components within normal limits  URINE CULTURE  SARS CORONAVIRUS 2 (TAT 6-24 HRS)  METHYLMALONIC ACID, SERUM  AMMONIA  BRAIN NATRIURETIC PEPTIDE  PROCALCITONIN  TROPONIN I (HIGH SENSITIVITY)   ____________________________________________  12 Lead EKG  Sinus rhythm, rate of 94 bpm.  Normal axis and intervals.  No evidence of acute ischemia. ____________________________________________  RADIOLOGY  ED MD interpretation: 2 view CXR reviewed by me with diffuse interstitial thickening without infiltration, edema or PTX CT head reviewed by me with hypoattenuation to multiple areas from old strokes, without evidence of  acute intracranial pathology  Official radiology report(s): DG Chest 2 View  Result Date: 03/11/2021 CLINICAL DATA:  Confusion EXAM: CHEST - 2 VIEW COMPARISON:  January 08, 2021 FINDINGS: Interstitium is mildly thickened. No edema or airspace opacity. Heart size and pulmonary vascularity are normal. No adenopathy. No bone lesions. Surgical clips noted in gallbladder fossa region. IMPRESSION: Interstitial thickening, likely indicative of underlying chronic bronchitis. No edema or airspace opacity. Cardiac silhouette normal. Electronically Signed   By: Lowella Grip III M.D.   On: 03/11/2021 09:56   CT Head Wo Contrast  Result Date: 03/11/2021 CLINICAL DATA:  Altered mental status/confusion EXAM: CT HEAD WITHOUT CONTRAST TECHNIQUE: Contiguous axial images were obtained from the base of the skull through the vertex without intravenous contrast. COMPARISON:  February 22, 2021 FINDINGS: Brain: Moderate diffuse atrophy is stable. There is no intracranial mass, hemorrhage, extra-axial fluid collection, or midline shift. Decreased attenuation is noted with encephalomalacia in the medial right occipital lobe consistent with prior infarct. Prior infarct also noted in the posterosuperior right parietal lobe, stable. A prior infarct is noted in the posterior aspect of the mid left  cerebellum, stable. There is mild decreased attenuation in portions of the centra semiovale bilaterally. No acute appearing infarct is evident. Vascular: There is no hyperdense vessel. There is calcification in each carotid siphon region. Skull: The bony calvarium appears intact. Sinuses/Orbits: There is slight mucosal thickening in several ethmoid air cells. Visualized orbits appear symmetric bilaterally. Other: Mastoid air cells are clear. IMPRESSION: Stable atrophy. Prior infarcts in the medial right occipital lobe, superior posterior right parietal lobe, and posterior mid left cerebellum. Decreased attenuation in the periventricular white matter is likely due to small vessel vascular disease, although a mild degree of interstitial edema secondary to the ventricular enlargement could present in this manner. No acute infarct evident. No mass or hemorrhage. There are foci of arterial vascular calcification. There is slight mucosal thickening in several ethmoid air cells. Electronically Signed   By: Lowella Grip III M.D.   On: 03/11/2021 10:00   CT Renal Stone Study  Result Date: 03/11/2021 CLINICAL DATA:  70 year old with chronic kidney disease, presenting with a urinary tract infection. EXAM: CT ABDOMEN AND PELVIS WITHOUT CONTRAST TECHNIQUE: Multidetector CT imaging of the abdomen and pelvis was performed following the standard protocol without IV contrast. COMPARISON:  01/09/2021. FINDINGS: Lower chest: LEFT pleural effusion and associated mild passive atelectasis in th, broad-based central disc protrusion and multifactorial spinal stenosis at L4-5, and diffuse degenerative changes e LEFT LOWER LOBE. Visualized lung bases otherwise clear. Heart moderately enlarged. Small pericardial effusion versus pericardial thickening. Hepatobiliary: Normal unenhanced appearance of the liver. Surgically absent gallbladder. No unexpected biliary ductal dilation. Pancreas: Normal unenhanced appearance. Spleen: Subtle low  attenuation involving the lower pole of the spleen, not present on the prior CT, with associated edema in the perisplenic fat. Adrenals/Urinary Tract: Normal appearing adrenal glands. Scarring and mild cortical thinning involving both kidneys. Allowing for the unenhanced technique, no significant focal parenchymal abnormalities involving either kidney. No hydronephrosis. No urinary tract calculi. Normal appearing mildly distended urinary bladder. Stomach/Bowel: Stomach normal in appearance for the degree of distention. Normal-appearing small bowel. Mobile cecum positioned in the RIGHT UPPER QUADRANT. Large rectal colonic stool burden and moderate colonic stool burden elsewhere. High attenuation ingested material within the colon and in the normal appearing appendix located in the RIGHT mid abdomen. No focal colonic abnormality. Vascular/Lymphatic: Mild atherosclerosis involving the abdominal aorta without evidence of aneurysm. No pathologic lymphadenopathy. Reproductive: Normal-appearing  uterus and ovaries without evidence of adnexal mass. Other: Mild edema in the abdominal wall overlying both flanks, right greater than left. Musculoskeletal: Degenerative disc disease and spondylosis at L5-S1, broad-based central disc protrusion and multifactorial spinal stenosis at L4-5 and diffuse facet degenerative changes throughout the lumbar spine. No acute findings. IMPRESSION: 1. Subtle low attenuation involving the lower pole of the spleen, not present on the prior CT, with associated edema in the perisplenic fat, possibly indicating splenic infarct or splenic abscess. CT abdomen with contrast may be confirmatory. If the patient's renal function does not allow intravenous contrast administration, then ultrasound may be helpful in further evaluation. 2. No acute abnormalities otherwise involving the abdomen or pelvis. 3. LEFT pleural effusion and associated mild passive atelectasis in the LEFT LOWER LOBE. 4. Small  pericardial effusion versus pericardial thickening. 5. Aortic Atherosclerosis (ICD10-I70.0). Electronically Signed   By: Evangeline Dakin M.D.   On: 03/11/2021 12:12    ____________________________________________   PROCEDURES and INTERVENTIONS  Procedure(s) performed (including Critical Care):  .1-3 Lead EKG Interpretation Performed by: Vladimir Crofts, MD Authorized by: Vladimir Crofts, MD     Interpretation: normal     ECG rate:  84   ECG rate assessment: normal     Rhythm: sinus rhythm     Ectopy: none     Conduction: normal      Medications  atorvastatin (LIPITOR) tablet 20 mg (has no administration in time range)  buPROPion (WELLBUTRIN XL) 24 hr tablet 150 mg (has no administration in time range)  escitalopram (LEXAPRO) tablet 20 mg (has no administration in time range)  traZODone (DESYREL) tablet 100 mg (has no administration in time range)  levothyroxine (SYNTHROID) tablet 100 mcg (has no administration in time range)  famotidine (PEPCID) tablet 20 mg (has no administration in time range)  oxybutynin (DITROPAN-XL) 24 hr tablet 15 mg (has no administration in time range)  apixaban (ELIQUIS) tablet 5 mg (has no administration in time range)  ferrous sulfate EC tablet 325 mg (has no administration in time range)  vitamin B-12 (CYANOCOBALAMIN) tablet 1,000 mcg (has no administration in time range)  Centrum Silver 50+Women TABS (has no administration in time range)  montelukast (SINGULAIR) tablet 10 mg (has no administration in time range)  acetaminophen (TYLENOL) tablet 1,000 mg (has no administration in time range)    Or  acetaminophen (TYLENOL) suppository 650 mg (has no administration in time range)  ondansetron (ZOFRAN) tablet 4 mg (has no administration in time range)    Or  ondansetron (ZOFRAN) injection 4 mg (has no administration in time range)  lactated ringers bolus 1,000 mL (0 mLs Intravenous Stopped 03/11/21 1317)  acetaminophen (TYLENOL) tablet 1,000 mg (1,000 mg  Oral Given 03/11/21 1009)  oxyCODONE (Oxy IR/ROXICODONE) immediate release tablet 5 mg (5 mg Oral Given 03/11/21 1101)  cefTRIAXone (ROCEPHIN) 1 g in sodium chloride 0.9 % 100 mL IVPB (0 g Intravenous Stopped 03/11/21 1131)  morphine 4 MG/ML injection 4 mg (4 mg Intravenous Given 03/11/21 1157)    ____________________________________________   MDM / ED COURSE   70 year old woman presents to the ED from local SNF with worsening confusion, with evidence of acute metabolic encephalopathy due to acute cystitis and requiring medical admission.  Normal vitals on room air.  Exam with suprapubic tenderness without peritoneal features.  She otherwise looks well without neurologic or vascular deficits, distress or any trauma.  Blood work with AKI on CKD that appears prerenal.  Chronic anemia.  UA with infectious features and concerning for  the source of her encephalopathy.  CXR demonstrates no infiltrates to suggest CAP and CT head demonstrates no ICH or signs of acute CVA.  CT renal study demonstrates no obstruction.  We will send urine for culture and start Rocephin for treatment.  We will discussed the case with hospitalist medicine for admission due to her associated encephalopathy and AKI.   Clinical Course as of 03/11/21 1332  Sat Mar 11, 2021  0943 Daughter arrived to the ED and to patient's room.  Patient is currently in imaging, and I have not had a chance to examine her yet.  I discussed daughter's concerns and acquire history from her.  Daughter is regularly involved and sees the patient daily, sometimes twice daily.  She reports increasing confusion over the past week or so, primarily the past 3 days.  Daughter reports that patient received 3 days of antibiotics, this finished 3 days ago, to treat a UTI, but despite this her behavior is worsened.  Further reports analgesia transitioned from oxycodone to tramadol with further worsening of mental status.  Daughter expresses frustration at [facility],  indicating that both of her parents are there and shared the same room, but indicates that she will be moving them both to another facility [DS]  1001 Return to the bedside and examined the patient.  Discussed plan of care with the daughter, including urinalysis with high suspicion for UTI.  We discussed IV fluids for dehydration.  We discussed the possibility of CT imaging of her abdomen/pelvis if her urinalysis is unremarkable.  Daughter is agreeable.  Daughter is indicating that she would like the patient admitted for observation [DS]  31 Educated daughter of diagnosis of acute cystitis.  We discussed her blood work.  We discussed CT renal study and likely observation medical admission thereafter.  She is agreeable. [DS]    Clinical Course User Index [DS] Vladimir Crofts, MD    ____________________________________________   FINAL CLINICAL IMPRESSION(S) / ED DIAGNOSES  Final diagnoses:  Confusion  Dehydration  AKI (acute kidney injury) ([place])  Acute metabolic encephalopathy  Acute cystitis with hematuria     ED Discharge Orders    None       Markie Heffernan   Note:  This document was prepared using Dragon voice recognition software and may include unintentional dictation errors.   Vladimir Crofts, MD 03/11/21 [phone]

## 2021-03-11 NOTE — Plan of Care (Signed)
Oriented to room, call bell, television. Notified to call with any needs. Verbalized understanding.

## 2021-03-11 NOTE — ED Notes (Signed)
EDP to bedside; family provided update on plan of care.

## 2021-03-12 ENCOUNTER — Observation Stay: Payer: Medicare Other

## 2021-03-12 DIAGNOSIS — E538 Deficiency of other specified B group vitamins: Secondary | ICD-10-CM | POA: Diagnosis present

## 2021-03-12 DIAGNOSIS — I7409 Other arterial embolism and thrombosis of abdominal aorta: Secondary | ICD-10-CM | POA: Diagnosis not present

## 2021-03-12 DIAGNOSIS — R7402 Elevation of levels of lactic acid dehydrogenase (LDH): Secondary | ICD-10-CM | POA: Diagnosis not present

## 2021-03-12 DIAGNOSIS — I700 Atherosclerosis of aorta: Secondary | ICD-10-CM | POA: Diagnosis not present

## 2021-03-12 DIAGNOSIS — R279 Unspecified lack of coordination: Secondary | ICD-10-CM | POA: Diagnosis not present

## 2021-03-12 DIAGNOSIS — N179 Acute kidney failure, unspecified: Secondary | ICD-10-CM | POA: Diagnosis not present

## 2021-03-12 DIAGNOSIS — R1312 Dysphagia, oropharyngeal phase: Secondary | ICD-10-CM | POA: Diagnosis not present

## 2021-03-12 DIAGNOSIS — D68312 Antiphospholipid antibody with hemorrhagic disorder: Secondary | ICD-10-CM | POA: Diagnosis not present

## 2021-03-12 DIAGNOSIS — R5381 Other malaise: Secondary | ICD-10-CM | POA: Diagnosis not present

## 2021-03-12 DIAGNOSIS — D509 Iron deficiency anemia, unspecified: Secondary | ICD-10-CM | POA: Diagnosis not present

## 2021-03-12 DIAGNOSIS — I82409 Acute embolism and thrombosis of unspecified deep veins of unspecified lower extremity: Secondary | ICD-10-CM | POA: Diagnosis not present

## 2021-03-12 DIAGNOSIS — I509 Heart failure, unspecified: Secondary | ICD-10-CM | POA: Diagnosis not present

## 2021-03-12 DIAGNOSIS — I6389 Other cerebral infarction: Secondary | ICD-10-CM | POA: Diagnosis not present

## 2021-03-12 DIAGNOSIS — K219 Gastro-esophageal reflux disease without esophagitis: Secondary | ICD-10-CM | POA: Diagnosis present

## 2021-03-12 DIAGNOSIS — I6350 Cerebral infarction due to unspecified occlusion or stenosis of unspecified cerebral artery: Secondary | ICD-10-CM | POA: Diagnosis not present

## 2021-03-12 DIAGNOSIS — F32A Depression, unspecified: Secondary | ICD-10-CM | POA: Diagnosis present

## 2021-03-12 DIAGNOSIS — D649 Anemia, unspecified: Secondary | ICD-10-CM | POA: Diagnosis not present

## 2021-03-12 DIAGNOSIS — N183 Chronic kidney disease, stage 3 unspecified: Secondary | ICD-10-CM | POA: Diagnosis not present

## 2021-03-12 DIAGNOSIS — R935 Abnormal findings on diagnostic imaging of other abdominal regions, including retroperitoneum: Secondary | ICD-10-CM | POA: Diagnosis not present

## 2021-03-12 DIAGNOSIS — F0150 Vascular dementia without behavioral disturbance: Secondary | ICD-10-CM | POA: Diagnosis present

## 2021-03-12 DIAGNOSIS — R269 Unspecified abnormalities of gait and mobility: Secondary | ICD-10-CM | POA: Diagnosis not present

## 2021-03-12 DIAGNOSIS — G8191 Hemiplegia, unspecified affecting right dominant side: Secondary | ICD-10-CM | POA: Diagnosis present

## 2021-03-12 DIAGNOSIS — N189 Chronic kidney disease, unspecified: Secondary | ICD-10-CM | POA: Diagnosis not present

## 2021-03-12 DIAGNOSIS — M84471D Pathological fracture, right ankle, subsequent encounter for fracture with routine healing: Secondary | ICD-10-CM | POA: Diagnosis not present

## 2021-03-12 DIAGNOSIS — I69228 Other speech and language deficits following other nontraumatic intracranial hemorrhage: Secondary | ICD-10-CM | POA: Diagnosis not present

## 2021-03-12 DIAGNOSIS — E278 Other specified disorders of adrenal gland: Secondary | ICD-10-CM | POA: Diagnosis not present

## 2021-03-12 DIAGNOSIS — D696 Thrombocytopenia, unspecified: Secondary | ICD-10-CM | POA: Diagnosis present

## 2021-03-12 DIAGNOSIS — R410 Disorientation, unspecified: Secondary | ICD-10-CM | POA: Diagnosis not present

## 2021-03-12 DIAGNOSIS — F419 Anxiety disorder, unspecified: Secondary | ICD-10-CM | POA: Diagnosis present

## 2021-03-12 DIAGNOSIS — G459 Transient cerebral ischemic attack, unspecified: Secondary | ICD-10-CM | POA: Diagnosis not present

## 2021-03-12 DIAGNOSIS — M6281 Muscle weakness (generalized): Secondary | ICD-10-CM | POA: Diagnosis not present

## 2021-03-12 DIAGNOSIS — G9389 Other specified disorders of brain: Secondary | ICD-10-CM | POA: Diagnosis present

## 2021-03-12 DIAGNOSIS — R41841 Cognitive communication deficit: Secondary | ICD-10-CM | POA: Diagnosis not present

## 2021-03-12 DIAGNOSIS — Z66 Do not resuscitate: Secondary | ICD-10-CM | POA: Diagnosis present

## 2021-03-12 DIAGNOSIS — Z9049 Acquired absence of other specified parts of digestive tract: Secondary | ICD-10-CM | POA: Diagnosis not present

## 2021-03-12 DIAGNOSIS — D739 Disease of spleen, unspecified: Secondary | ICD-10-CM | POA: Diagnosis not present

## 2021-03-12 DIAGNOSIS — G9340 Encephalopathy, unspecified: Secondary | ICD-10-CM | POA: Diagnosis not present

## 2021-03-12 DIAGNOSIS — I129 Hypertensive chronic kidney disease with stage 1 through stage 4 chronic kidney disease, or unspecified chronic kidney disease: Secondary | ICD-10-CM | POA: Diagnosis present

## 2021-03-12 DIAGNOSIS — R109 Unspecified abdominal pain: Secondary | ICD-10-CM | POA: Diagnosis not present

## 2021-03-12 DIAGNOSIS — N1832 Chronic kidney disease, stage 3b: Secondary | ICD-10-CM | POA: Diagnosis present

## 2021-03-12 DIAGNOSIS — G9341 Metabolic encephalopathy: Secondary | ICD-10-CM | POA: Diagnosis not present

## 2021-03-12 DIAGNOSIS — E039 Hypothyroidism, unspecified: Secondary | ICD-10-CM | POA: Diagnosis not present

## 2021-03-12 DIAGNOSIS — W19XXXD Unspecified fall, subsequent encounter: Secondary | ICD-10-CM | POA: Diagnosis present

## 2021-03-12 DIAGNOSIS — I10 Essential (primary) hypertension: Secondary | ICD-10-CM | POA: Diagnosis not present

## 2021-03-12 DIAGNOSIS — D631 Anemia in chronic kidney disease: Secondary | ICD-10-CM | POA: Diagnosis present

## 2021-03-12 DIAGNOSIS — Z7901 Long term (current) use of anticoagulants: Secondary | ICD-10-CM | POA: Diagnosis not present

## 2021-03-12 DIAGNOSIS — I639 Cerebral infarction, unspecified: Secondary | ICD-10-CM

## 2021-03-12 DIAGNOSIS — I6340 Cerebral infarction due to embolism of unspecified cerebral artery: Secondary | ICD-10-CM | POA: Diagnosis present

## 2021-03-12 DIAGNOSIS — D735 Infarction of spleen: Secondary | ICD-10-CM | POA: Diagnosis not present

## 2021-03-12 DIAGNOSIS — L89322 Pressure ulcer of left buttock, stage 2: Secondary | ICD-10-CM | POA: Diagnosis present

## 2021-03-12 DIAGNOSIS — I63422 Cerebral infarction due to embolism of left anterior cerebral artery: Secondary | ICD-10-CM | POA: Diagnosis not present

## 2021-03-12 DIAGNOSIS — R1012 Left upper quadrant pain: Secondary | ICD-10-CM | POA: Diagnosis not present

## 2021-03-12 DIAGNOSIS — E860 Dehydration: Secondary | ICD-10-CM | POA: Diagnosis not present

## 2021-03-12 DIAGNOSIS — H53462 Homonymous bilateral field defects, left side: Secondary | ICD-10-CM | POA: Diagnosis present

## 2021-03-12 DIAGNOSIS — N3001 Acute cystitis with hematuria: Secondary | ICD-10-CM | POA: Diagnosis not present

## 2021-03-12 DIAGNOSIS — I6612 Occlusion and stenosis of left anterior cerebral artery: Secondary | ICD-10-CM | POA: Diagnosis not present

## 2021-03-12 DIAGNOSIS — Z20822 Contact with and (suspected) exposure to covid-19: Secondary | ICD-10-CM | POA: Diagnosis present

## 2021-03-12 DIAGNOSIS — E785 Hyperlipidemia, unspecified: Secondary | ICD-10-CM | POA: Diagnosis not present

## 2021-03-12 DIAGNOSIS — D6861 Antiphospholipid syndrome: Secondary | ICD-10-CM | POA: Diagnosis not present

## 2021-03-12 DIAGNOSIS — G319 Degenerative disease of nervous system, unspecified: Secondary | ICD-10-CM | POA: Diagnosis not present

## 2021-03-12 LAB — IRON AND TIBC
Iron: 39 ug/dL (ref 28–170)
Saturation Ratios: 16 % (ref 10.4–31.8)
TIBC: 245 ug/dL — ABNORMAL LOW (ref 250–450)
UIBC: 206 ug/dL

## 2021-03-12 LAB — APTT
aPTT: >200 seconds (ref 24–36)
aPTT: >200 seconds (ref 24–36)
aPTT: >160 seconds (ref 24–36)

## 2021-03-12 LAB — BASIC METABOLIC PANEL
Anion gap: 10 (ref 5–15)
BUN: 31 mg/dL — ABNORMAL HIGH (ref 8–23)
CO2: 24 mmol/L (ref 22–32)
Calcium: 9.5 mg/dL (ref 8.9–10.3)
Chloride: 103 mmol/L (ref 98–111)
Creatinine, Ser: 1.8 mg/dL — ABNORMAL HIGH (ref 0.44–1.00)
GFR, Estimated: 30 mL/min — ABNORMAL LOW (ref >60)
Glucose, Bld: 88 mg/dL (ref 70–99)
Potassium: 4.1 mmol/L (ref 3.5–5.1)
Sodium: 137 mmol/L (ref 135–145)

## 2021-03-12 LAB — CBC
HCT: 24 % — ABNORMAL LOW (ref 36.0–46.0)
Hemoglobin: 7.6 g/dL — ABNORMAL LOW (ref 12.0–15.0)
MCH: 30.6 pg (ref 26.0–34.0)
MCHC: 31.7 g/dL (ref 30.0–36.0)
MCV: 96.8 fL (ref 80.0–100.0)
Platelets: 80 10*3/uL — ABNORMAL LOW (ref 150–400)
RBC: 2.48 MIL/uL — ABNORMAL LOW (ref 3.87–5.11)
RDW: 13.3 % (ref 11.5–15.5)
WBC: 7.3 10*3/uL (ref 4.0–10.5)
nRBC: 0 % (ref 0.0–0.2)

## 2021-03-12 LAB — GLUCOSE, CAPILLARY: Glucose-Capillary: 89 mg/dL (ref 70–99)

## 2021-03-12 LAB — FERRITIN: Ferritin: 744 ng/mL — ABNORMAL HIGH (ref 11–307)

## 2021-03-12 LAB — PROCALCITONIN: Procalcitonin: 46.36 ng/mL

## 2021-03-12 LAB — URINE CULTURE: Culture: 20000 — AB

## 2021-03-12 LAB — HEPARIN LEVEL (UNFRACTIONATED): Heparin Unfractionated: 1.67 IU/mL — ABNORMAL HIGH (ref 0.30–0.70)

## 2021-03-12 LAB — TSH: TSH: 4.896 u[IU]/mL — ABNORMAL HIGH (ref 0.350–4.500)

## 2021-03-12 LAB — VITAMIN B12: Vitamin B-12: 4012 pg/mL — ABNORMAL HIGH (ref 180–914)

## 2021-03-12 IMAGING — MR MR HEAD W/O CM
11 series · 48 of 48 positions shown · non-contrast
Comparison: [DATE]

CLINICAL DATA: Confusion. Recent history of stroke.
Hypercoagulable.

EXAM:
MRI HEAD WITHOUT CONTRAST
TECHNIQUE: Multiplanar, multiecho pulse sequences of the brain and surrounding
structures were obtained without intravenous contrast.

[Series 5: ax dwi_tracew · axial · 3.0mm · 0.71mm/px · z∈[-101,+63]mm · 5 of 56 slices shown]
[im 1/56]
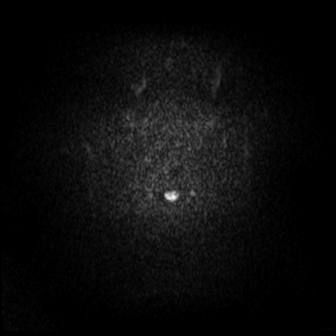
[im 14/56]
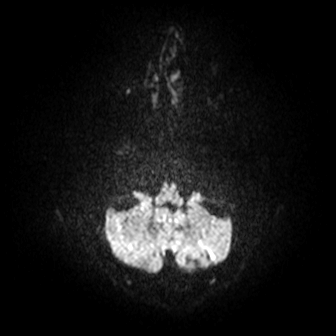
[im 28/56]
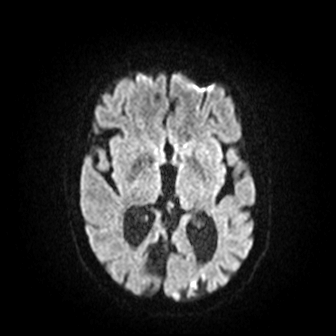
[im 42/56]
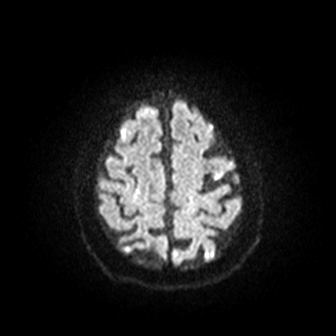
[im 56/56]
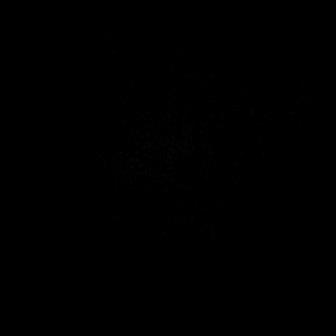

[Series 6: ax dwi_adc · axial · 3.0mm · 0.71mm/px · z∈[-101,+57]mm · 5 of 54 slices shown]
[im 1/54]
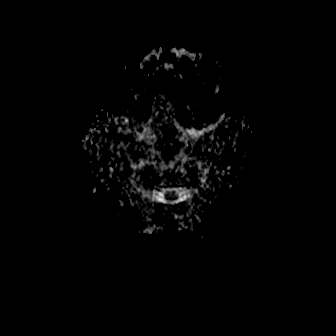
[im 14/54]
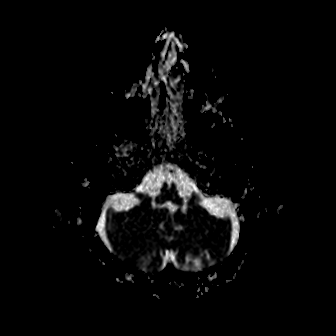
[im 27/54]
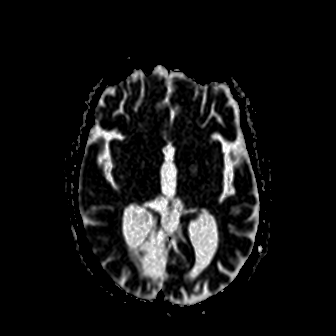
[im 40/54]
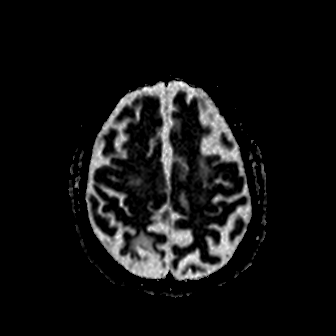
[im 54/54]
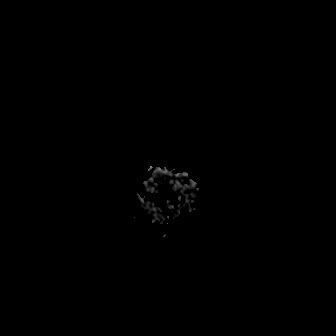

[Series 7: cor dwi_tracew · coronal · 5.0mm · 0.68mm/px · 3 of 40 slices shown]
[im 1/40]
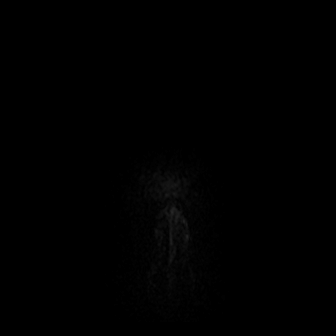
[im 20/40]
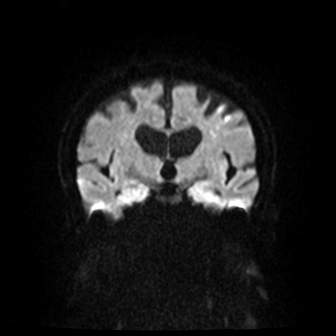
[im 40/40]
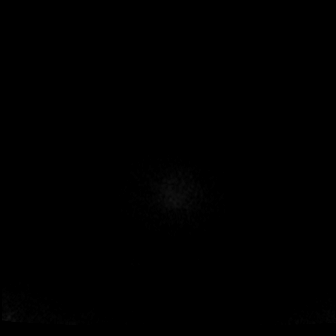

[Series 8: cor dwi_adc · coronal · 5.0mm · 0.68mm/px · 3 of 39 slices shown]
[im 1/39]
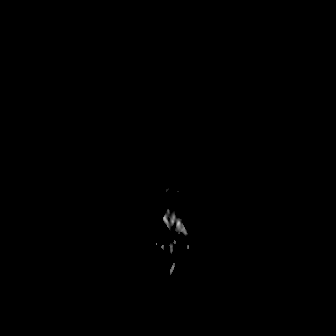
[im 20/39]
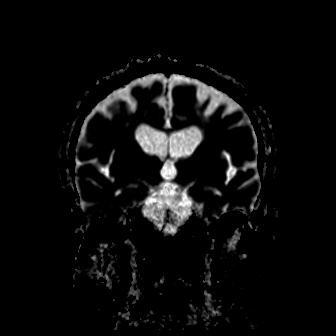
[im 39/39]
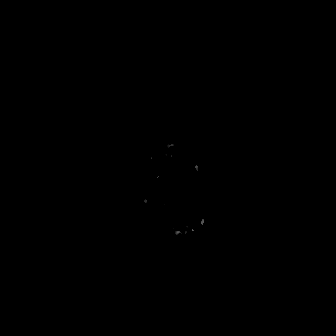

[Series 9: T1 · sagittal · 5.0mm · 0.47mm/px · 2 of 22 slices shown (1 of 2)]
[im 1/22]
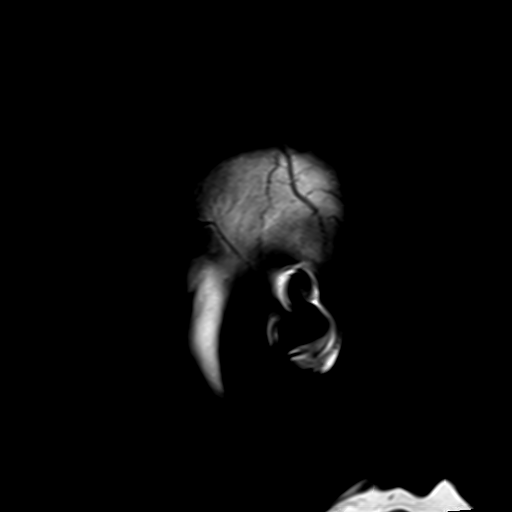
[im 22/22]
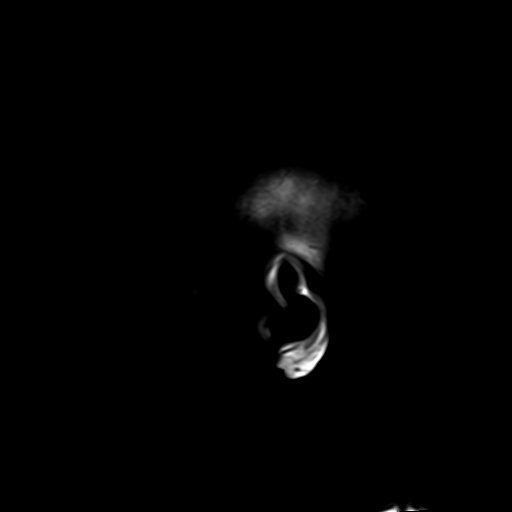

[Series 10: T2 · axial · 5.0mm · 0.86mm/px · z∈[-86,+57]mm · 2 of 25 slices shown (1 of 2)]
[im 1/25]
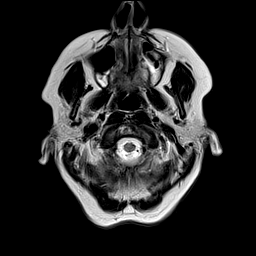
[im 25/25]
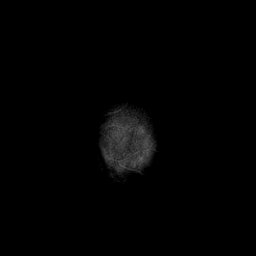

[Series 12: pha_images · axial · 3.0mm · 0.90mm/px · z∈[-90,+61]mm · 4 of 52 slices shown]
[im 1/52]
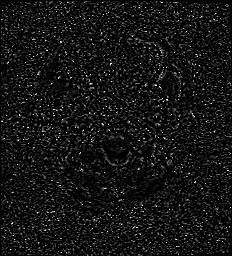
[im 18/52]
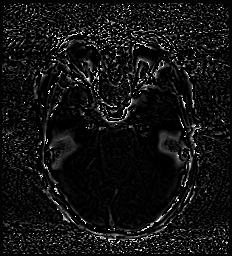
[im 35/52]
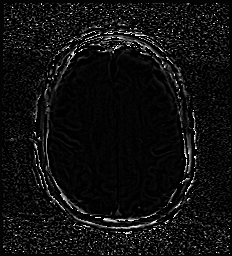
[im 52/52]
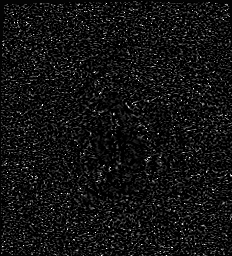

[Series 13: swi_images · axial · 3.0mm · 0.90mm/px · z∈[-90,+61]mm · 4 of 52 slices shown]
[im 1/52]
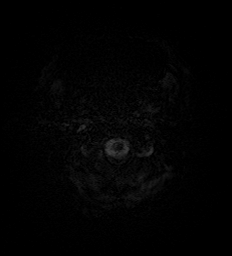
[im 18/52]
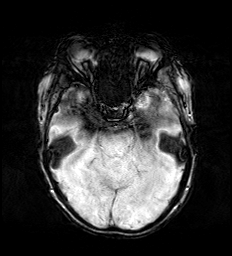
[im 35/52]
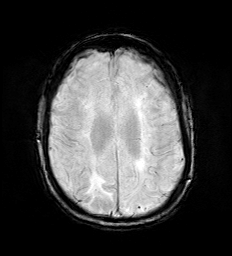
[im 52/52]
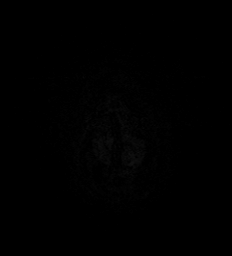

[Series 15: FLAIR · axial · 3.0mm · 0.69mm/px · z∈[-95,+63]mm · 4 of 54 slices shown]
[im 1/54]
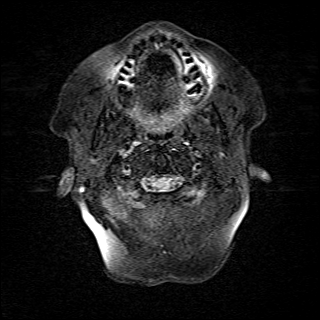
[im 18/54]
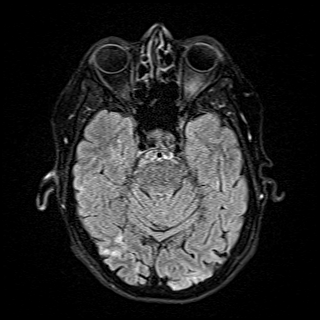
[im 36/54]
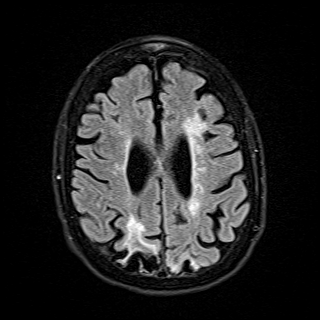
[im 54/54]
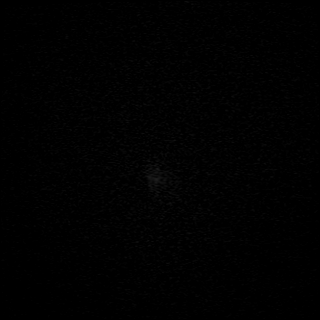

[Series 16: T1 · axial · 1.0mm · 0.98mm/px · z∈[-103,+70]mm · 14 of 174 slices shown (2 of 2)]
[im 1/174]
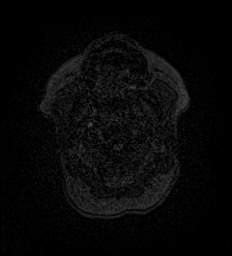
[im 14/174]
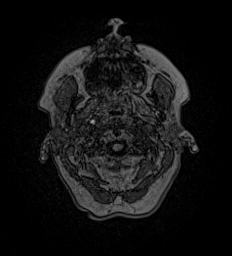
[im 27/174]
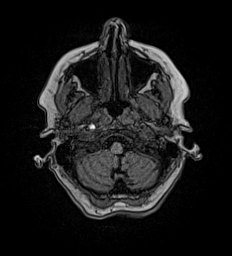
[im 40/174]
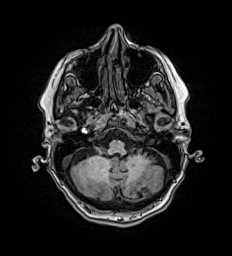
[im 54/174]
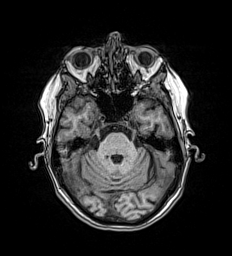
[im 67/174]
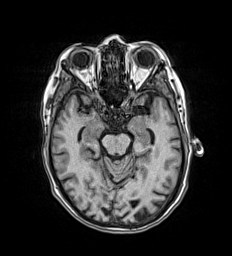
[im 80/174]
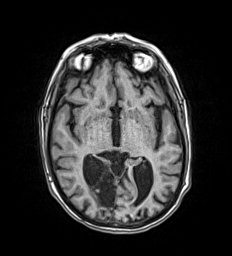
[im 94/174]
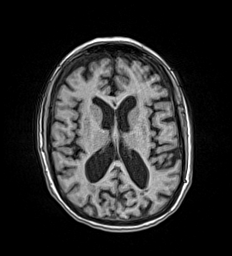
[im 107/174]
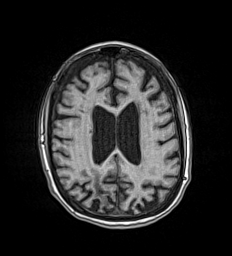
[im 120/174]
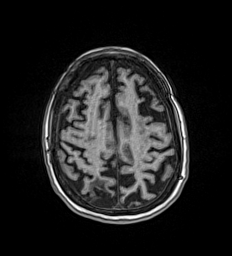
[im 134/174]
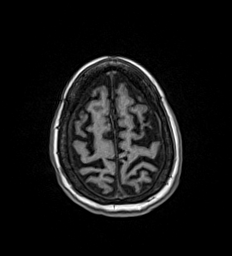
[im 147/174]
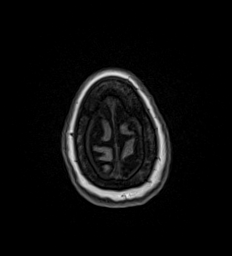
[im 160/174]
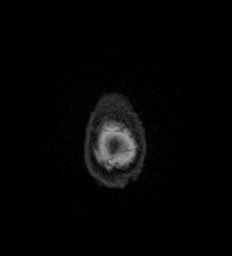
[im 174/174]
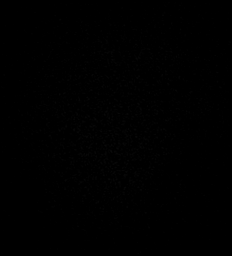

[Series 17: T2 · coronal · 5.0mm · 0.45mm/px · 2 of 31 slices shown (2 of 2)]
[im 1/31]
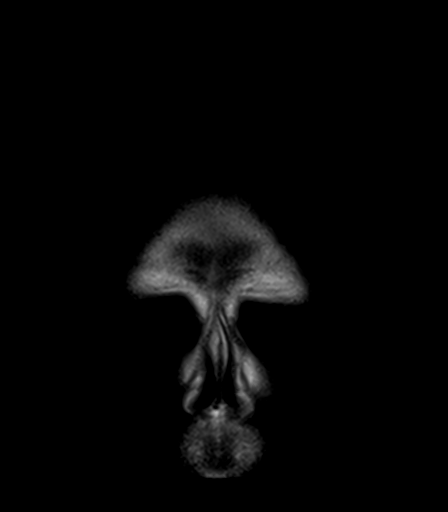
[im 31/31]
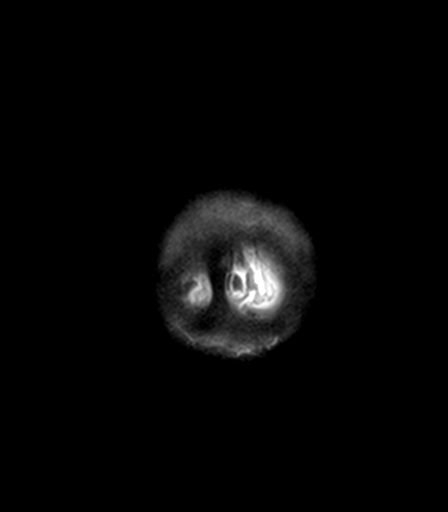

[48 of 48 positions shown; findings below may reference images not displayed]

FINDINGS: Brain: Innumerable subcentimeter mainly cortically based acute
infarcts along the bilateral cerebral convexity with predilection
for the watershed regions. Similar size infarcts are seen in the
bilateral cerebellum. Numerous pre-existing cerebellar and cortical
infarcts with generalized brain atrophy. Large remote right
occipital infarct with dense encephalomalacia. No acute hemorrhage,
hydrocephalus, or mass.

Vascular: Major flow voids are preserved

Skull and upper cervical spine: Normal marrow signal

Sinuses/Orbits: Negative
IMPRESSION: 1. Shower of embolic pattern acute infarcts involving the infra and
supratentorial brain.
2. Extensive chronic ischemic injury.

## 2021-03-12 MED ORDER — FENTANYL CITRATE (PF) 100 MCG/2ML IJ SOLN
12.5000 ug | Freq: Four times a day (QID) | INTRAMUSCULAR | Status: DC | PRN
  Administered 2021-03-12: 12.5 ug via INTRAVENOUS
  Filled 2021-03-12: qty 2

## 2021-03-12 MED ORDER — WARFARIN SODIUM 2 MG PO TABS
2.0000 mg | ORAL_TABLET | Freq: Once | ORAL | Status: AC
  Administered 2021-03-12: 2 mg via ORAL
  Filled 2021-03-12 (×2): qty 1

## 2021-03-12 MED ORDER — HEPARIN (PORCINE) 25000 UT/250ML-% IV SOLN
500.0000 [IU]/h | INTRAVENOUS | Status: DC
  Administered 2021-03-12: 500 [IU]/h via INTRAVENOUS

## 2021-03-12 MED ORDER — WARFARIN - PHARMACIST DOSING INPATIENT: Freq: Every day | Status: DC

## 2021-03-12 MED ORDER — LACTATED RINGERS IV SOLN: INTRAVENOUS | Status: AC

## 2021-03-12 MED ORDER — HEPARIN (PORCINE) 25000 UT/250ML-% IV SOLN: 350.0000 [IU]/h | INTRAVENOUS | Status: DC

## 2021-03-12 MED ORDER — OXYCODONE HCL 5 MG PO TABS
5.0000 mg | ORAL_TABLET | Freq: Four times a day (QID) | ORAL | Status: DC | PRN
Start: 2021-03-12 — End: 2021-03-13
  Administered 2021-03-12: 5 mg via ORAL
  Filled 2021-03-12: qty 1

## 2021-03-12 MED ORDER — HEPARIN (PORCINE) 25000 UT/250ML-% IV SOLN
700.0000 [IU]/h | INTRAVENOUS | Status: DC
  Administered 2021-03-12 (×2): 700 [IU]/h via INTRAVENOUS
  Filled 2021-03-12: qty 250

## 2021-03-12 MED ORDER — STROKE: EARLY STAGES OF RECOVERY BOOK: Freq: Once | Status: AC

## 2021-03-12 MED ORDER — FENTANYL CITRATE (PF) 100 MCG/2ML IJ SOLN
12.5000 ug | Freq: Four times a day (QID) | INTRAMUSCULAR | Status: DC | PRN
  Administered 2021-03-12 – 2021-03-13 (×2): 12.5 ug via INTRAVENOUS
  Filled 2021-03-12 (×2): qty 2

## 2021-03-12 NOTE — Consult Note (Addendum)
Referring Physician: Dr. Sloan Leiter    Chief Complaint: Acute onset of confusion  HPI: [name] is an 70 y.o. female with a history of recent right ankle fracture (3/23), arthritis, depression, HLD, HTN, renal insufficiency, thyroid disease, antiphospholipid syndrome and 2 prior strokes, who presented from [facility] via EMS yesterday morning with intermittent confusion. Of note, her first stroke occurred almost 20 years ago and had been diagnosed as being secondary to antiphospholipid syndrome, for which she had been started on Coumadin. She had been stable on Coumadin without stroke recurrence for almost 20 years, until she had a second stroke in January of this year. During her January stroke admission she was switched from Coumadin to Eliquis as she had been classified as having failed Coumadin therapy. Unfortunately, MRI brain obtained this morning revealed multiple punctate acute ischemic infarctions in separate vascular territories infratentorially and supratentorially, many of which are cortically-based, most consistent with a cardioembolic etiology. Cardiology has consulted and recommends switching back to Coumadin. She is currently being bridged with IV heparin.    Past Medical History:  Diagnosis Date  . Arthritis 04/02/1996  . Depression   . GERD (gastroesophageal reflux disease) 04/03/2019  . Hyperlipidemia   . Hypertension   . Renal insufficiency   . Stroke ([place])   . Thyroid disease     Past Surgical History:  Procedure Laterality Date  . CESAREAN SECTION    . CHOLECYSTECTOMY    . COLONOSCOPY  2012   repeat in 10 yrs  . KNEE ARTHROSCOPY W/ MENISCAL REPAIR Left     Family History  Problem Relation Age of Onset  . Stroke Father   . Breast cancer Maternal Grandmother   . Kidney cancer Mother   . Cancer Mother   . Arthritis Brother    Social History:  reports that she has never smoked. She has never used smokeless tobacco. She reports current alcohol use of  about 1.0 standard drink of alcohol per week. She reports that she does not use drugs.  Allergies:  Allergies  Allergen Reactions  . Penicillin G Rash    Medications:  Prior to Admission:  Medications Prior to Admission  Medication Sig Dispense Refill Last Dose  . acetaminophen (TYLENOL) 325 MG tablet Take 2 tablets (650 mg total) by mouth every 4 (four) hours as needed for mild pain (or temp > 37.5 C (99.5 F)).   Unknown at PRN  . apixaban (ELIQUIS) 5 MG TABS tablet Take 1 tablet (5 mg total) by mouth 2 (two) times daily. 60 tablet  03/10/2021 at 1800  . ascorbic acid (VITAMIN C) 500 MG tablet Take 500 mg by mouth 2 (two) times daily.     Marland Kitchen atorvastatin (LIPITOR) 20 MG tablet Take 1 tablet (20 mg total) by mouth daily. (Patient taking differently: Take 20 mg by mouth every evening.)   03/10/2021 at 1800  . escitalopram (LEXAPRO) 20 MG tablet Take 1 tablet (20 mg total) by mouth daily. 90 tablet 0 03/10/2021 at 0900  . famotidine (PEPCID) 20 MG tablet Take 20 mg by mouth daily.     . ferrous sulfate 325 (65 FE) MG EC tablet Take 1 tablet (325 mg total) by mouth daily with breakfast. (Patient taking differently: Take 325 mg by mouth at bedtime.) 30 tablet 3   . levothyroxine (SYNTHROID) 100 MCG tablet Take 1 tablet (100 mcg total) by mouth daily at 6 (six) AM.   03/10/2021 at 0600  . loperamide (IMODIUM) 2 MG capsule Take 1 capsule (  2 mg total) by mouth every 6 (six) hours as needed for diarrhea or loose stools. 30 capsule 0 Unknown at PRN  . montelukast (SINGULAIR) 10 MG tablet TAKE 1 TABLET BY MOUTH EVERYDAY AT BEDTIME (Patient taking differently: Take 10 mg by mouth at bedtime. TAKE 1 TABLET BY MOUTH EVERYDAY AT BEDTIME) 90 tablet 3 03/10/2021 at 2000  . ondansetron (ZOFRAN-ODT) 4 MG disintegrating tablet Take 4 mg by mouth every 6 (six) hours as needed for nausea or vomiting.   Unknown at PRN  . oxybutynin (DITROPAN XL) 15 MG 24 hr tablet Take 1 tablet (15 mg total) by mouth at bedtime.   03/10/2021 at  2000  . sodium chloride (OCEAN) 0.65 % SOLN nasal spray Place 2 sprays into both nostrils every 6 (six) hours.     . traMADol (ULTRAM) 50 MG tablet Take 50 mg by mouth every 6 (six) hours.     . traZODone (DESYREL) 100 MG tablet Take 1 tablet (100 mg total) by mouth at bedtime.   03/10/2021 at 2000  . vitamin B-12 (CYANOCOBALAMIN) 1000 MCG tablet Take 1,000 mcg by mouth daily.     Marland Kitchen buPROPion (WELLBUTRIN XL) 150 MG 24 hr tablet Take 1 tablet (150 mg total) by mouth daily. (Patient not taking: No sig reported) 30 tablet 0 Not Taking at Unknown time  . diclofenac Sodium (VOLTAREN) 1 % GEL Apply 2 g topically 4 (four) times daily as needed (left knee pain). (Patient not taking: No sig reported)   Not Taking at Unknown time  . neomycin-bacitracin-polymyxin (NEOSPORIN) OINT Apply 1 application topically 2 (two) times daily. (Patient not taking: No sig reported)   Not Taking at Unknown time   Scheduled: .  stroke: mapping our early stages of recovery book   Does not apply Once  . atorvastatin  20 mg Oral QHS  . buPROPion  150 mg Oral Daily  . escitalopram  20 mg Oral Daily  . famotidine  20 mg Oral Daily  . ferrous sulfate  325 mg Oral QHS  . levothyroxine  100 mcg Oral Q0600  . mouth rinse  15 mL Mouth Rinse BID  . melatonin  5 mg Oral QHS  . montelukast  10 mg Oral QHS  . multivitamin with minerals   Oral Daily  . oxybutynin  15 mg Oral QHS  . vitamin B-12  1,000 mcg Oral Daily  . warfarin  2 mg Oral ONCE-1600  . Warfarin - Pharmacist Dosing Inpatient   Does not apply q1600   Continuous: . cefTRIAXone (ROCEPHIN)  IV 1 g (03/12/21 0902)  . heparin 500 Units/hr (03/12/21 1319)  . lactated ringers 100 mL/hr at 03/12/21 1323    ROS: Has significant left inferior quadrant abdominal pain. Other ROS as per HPI. The patient has no additional complaints at the time of Neurology evaluation.   Physical Examination: Blood pressure (!) 148/75, pulse 88, temperature 98.6 F (37 C), temperature source  Oral, resp. rate 19, height 5\' 8"  (1.727 m), weight 83.5 kg, SpO2 96 %.  HEENT: [place]/AT Lungs: Respirations unlabored Abdomen: TTP to LLQ. Non-distended.  Ext: No edema  Neurologic Examination: Mental Status: Awake with mildly decreased level of alertness. Speech is fluent with intact comprehension and naming. Able to follow all commands without difficulty. Oriented to city, state and day of the week, but not the month or the year. Mild inattention to her left side.  Cranial Nerves: II: Left homonymous hemianopsia. PERRL.  III,IV, VI: EOMI with saccadic pursuits noted. No  nystagmus.  V,VII: Decreased NL fold on the left. Facial temp sensation equal bilaterally  VIII: hearing intact to conversation IX,X: No hypophonia or hoarseness XI: Tends to keep head rotated slightly to the right of midline XII: midline tongue extension  Motor: RUE and RLE 5/5 LUE and LLE 5/5 No pronator drift.  Sensory: Temp and FT sensation equal to BUE and BLE. No extinction to DSS.  Deep Tendon Reflexes:  3+ bilateral brachioradialis and biceps 3+ left patella, 4+ right patella (clonus) 2+ left achillles, 4+ right achilles (clonus)  Plantars: Equivocal bilaterally  Cerebellar: No ataxia with FNF bilaterally  Gait: Deferred  Results for orders placed or performed during the hospital encounter of 03/11/21 (from the past 48 hour(s))  Comprehensive metabolic panel     Status: Abnormal   Collection Time: 03/11/21  9:26 AM  Result Value Ref Range   Sodium 132 (L) 135 - 145 mmol/L   Potassium 3.9 3.5 - 5.1 mmol/L   Chloride 101 98 - 111 mmol/L   CO2 25 22 - 32 mmol/L   Glucose, Bld 112 (H) 70 - 99 mg/dL    Comment: Glucose reference range applies only to samples taken after fasting for at least 8 hours.   BUN 35 (H) 8 - 23 mg/dL   Creatinine, Ser 2.03 (H) 0.44 - 1.00 mg/dL   Calcium 10.0 8.9 - 10.3 mg/dL   Total Protein 7.2 6.5 - 8.1 g/dL   Albumin 2.7 (L) 3.5 - 5.0 g/dL   AST 46 (H) 15 - 41 U/L   ALT 44 0  - 44 U/L   Alkaline Phosphatase 262 (H) 38 - 126 U/L   Total Bilirubin 0.9 0.3 - 1.2 mg/dL   GFR, Estimated 26 (L) >60 mL/min    Comment: (NOTE) Calculated using the CKD-EPI Creatinine Equation (2021)    Anion gap 6 5 - 15    Comment: Performed at [facility], [address] [place], [place] [address]  CBC with Differential/Platelet     Status: Abnormal   Collection Time: 03/11/21  9:26 AM  Result Value Ref Range   WBC 8.5 4.0 - 10.5 K/uL   RBC 2.79 (L) 3.87 - 5.11 MIL/uL   Hemoglobin 8.6 (L) 12.0 - 15.0 g/dL   HCT 26.4 (L) 36.0 - 46.0 %   MCV 94.6 80.0 - 100.0 fL   MCH 30.8 26.0 - 34.0 pg   MCHC 32.6 30.0 - 36.0 g/dL   RDW 12.8 11.5 - 15.5 %   Platelets 104 (L) 150 - 400 K/uL    Comment: Immature Platelet Fraction may be clinically indicated, consider ordering this additional test [id number]    nRBC 0.0 0.0 - 0.2 %   Neutrophils Relative % 75 %   Neutro Abs 6.3 1.7 - 7.7 K/uL   Lymphocytes Relative 14 %   Lymphs Abs 1.2 0.7 - 4.0 K/uL   Monocytes Relative 7 %   Monocytes Absolute 0.6 0.1 - 1.0 K/uL   Eosinophils Relative 2 %   Eosinophils Absolute 0.2 0.0 - 0.5 K/uL   Basophils Relative 0 %   Basophils Absolute 0.0 0.0 - 0.1 K/uL   Immature Granulocytes 2 %   Abs Immature Granulocytes 0.16 (H) 0.00 - 0.07 K/uL    Comment: Performed at [facility], [address] [place], [place] [address]  Urinalysis, Complete w Microscopic Urine, Catheterized     Status: Abnormal   Collection Time: 03/11/21  9:26 AM  Result Value Ref Range   Color,  Urine YELLOW (A) YELLOW   APPearance CLOUDY (A) CLEAR   Specific Gravity, Urine 1.008 1.005 - 1.030   pH 6.0 5.0 - 8.0   Glucose, UA NEGATIVE NEGATIVE mg/dL   Hgb urine dipstick MODERATE (A) NEGATIVE   Bilirubin Urine NEGATIVE NEGATIVE   Ketones, ur NEGATIVE NEGATIVE mg/dL   Protein, ur NEGATIVE NEGATIVE mg/dL   Nitrite NEGATIVE NEGATIVE   Leukocytes,Ua LARGE (A) NEGATIVE   RBC / HPF 21-50 0 - 5 RBC/hpf   WBC,  UA >50 (H) 0 - 5 WBC/hpf   Bacteria, UA RARE (A) NONE SEEN   Squamous Epithelial / LPF 0-5 0 - 5   WBC Clumps PRESENT     Comment: Performed at [facility], [address] [place], [place] [address]  SARS CORONAVIRUS 2 (TAT 6-24 HRS) Nasopharyngeal Nasopharyngeal Swab     Status: None   Collection Time: 03/11/21 11:05 AM   Specimen: Nasopharyngeal Swab  Result Value Ref Range   SARS Coronavirus 2 NEGATIVE NEGATIVE    Comment: (NOTE) SARS-CoV-2 target nucleic acids are NOT DETECTED.  The SARS-CoV-2 RNA is generally detectable in upper and lower respiratory specimens during the acute phase of infection. Negative results do not preclude SARS-CoV-2 infection, do not rule out co-infections with other pathogens, and should not be used as the sole basis for treatment or other patient management decisions. Negative results must be combined with clinical observations, patient history, and epidemiological information. The expected result is Negative.  Fact Sheet for Patients: SugarRoll.be  Fact Sheet for Healthcare Providers: https://www.woods-mathews.com/  This test is not yet approved or cleared by the [place] FDA and  has been authorized for detection and/or diagnosis of SARS-CoV-2 by FDA under an Emergency Use Authorization (EUA). This EUA will remain  in effect (meaning this test can be used) for the duration of the COVID-19 declaration under Se ction 564(b)(1) of the Act, 21 U.S.C. section 360bbb-3(b)(1), unless the authorization is terminated or revoked sooner.  Performed at [facility], [address], [place], [place] [address]   Troponin I (High Sensitivity)     Status: Abnormal   Collection Time: 03/11/21  1:23 PM  Result Value Ref Range   Troponin I (High Sensitivity) 543 (HH) <18 ng/L    Comment: CRITICAL RESULT CALLED TO, READ BACK BY AND VERIFIED WITH KASSIE FELTS 03/11/21 1408 KBH (NOTE) Elevated high  sensitivity troponin I (hsTnI) values and significant  changes across serial measurements may suggest ACS but many other  chronic and acute conditions are known to elevate hsTnI results.  Refer to the "Links" section for chest pain algorithms and additional  guidance. Performed at [facility], [address] [place], [place] [address]   Ammonia     Status: Abnormal   Collection Time: 03/11/21  1:23 PM  Result Value Ref Range   Ammonia <9 (L) 9 - 35 umol/L    Comment: Performed at [facility], [address] [place], [place] [address]  Brain natriuretic peptide     Status: Abnormal   Collection Time: 03/11/21  1:23 PM  Result Value Ref Range   B Natriuretic Peptide 341.9 (H) 0.0 - 100.0 pg/mL    Comment: Performed at [facility], [address] [place], [place] [address]  Procalcitonin - Baseline     Status: None   Collection Time: 03/11/21  1:23 PM  Result Value Ref Range   Procalcitonin 59.38 ng/mL    Comment:  Interpretation: PCT >= 10 ng/mL: Important systemic inflammatory response, almost exclusively due to severe bacterial sepsis or septic shock. (NOTE)       Sepsis PCT Algorithm           Lower Respiratory Tract                                      Infection PCT Algorithm    ----------------------------     ----------------------------         PCT < 0.25 ng/mL                PCT < 0.10 ng/mL          Strongly encourage             Strongly discourage   discontinuation of antibiotics    initiation of antibiotics    ----------------------------     -----------------------------       PCT 0.25 - 0.50 ng/mL            PCT 0.10 - 0.25 ng/mL               OR       >80% decrease in PCT            Discourage initiation of                                            antibiotics      Encourage discontinuation           of antibiotics    ----------------------------     -----------------------------         PCT >= 0.50 ng/mL               PCT 0.26 - 0.50 ng/mL                AND       <80% decrease in PCT             Encourage initiation of                                             antibiotics       Encourage continuation           of antibiotics    ----------------------------     -----------------------------        PCT >= 0.50 ng/mL                  PCT > 0.50 ng/mL               AND         increase in PCT                  Strongly encourage                                      initiation of antibiotics    Strongly encourage escalation           of antibiotics                                     -----------------------------  PCT <= 0.25 ng/mL                                                 OR                                        > 80% decrease in PCT                                      Discontinue / Do not initiate                                             antibiotics  Performed at [facility], [place], [place] [address]   Heparin level (unfractionated)     Status: Abnormal   Collection Time: 03/11/21  3:07 PM  Result Value Ref Range   Heparin Unfractionated 3.38 (H) 0.30 - 0.70 IU/mL    Comment: RESULTS CONFIRMED BY MANUAL DILUTION (NOTE) If heparin results are below expected values, and patient dosage has  been confirmed, suggest follow up testing of antithrombin III levels. Performed at [facility], [address] [place], [place] [address]   APTT     Status: Abnormal   Collection Time: 03/11/21  3:07 PM  Result Value Ref Range   aPTT 150 (H) 24 - 36 seconds    Comment:        IF BASELINE aPTT IS ELEVATED, SUGGEST PATIENT RISK ASSESSMENT BE USED TO DETERMINE APPROPRIATE ANTICOAGULANT THERAPY. Performed at [facility], [address] [place], [place] [address]   Protime-INR     Status: Abnormal   Collection Time: 03/11/21  3:07 PM  Result Value Ref Range   Prothrombin Time 18.4 (H) 11.4 - 15.2  seconds   INR 1.6 (H) 0.8 - 1.2    Comment: (NOTE) INR goal varies based on device and disease states. Performed at [facility], [place], [place] [address]   Troponin I (High Sensitivity)     Status: Abnormal   Collection Time: 03/11/21  3:07 PM  Result Value Ref Range   Troponin I (High Sensitivity) 564 (HH) <18 ng/L    Comment: CRITICAL VALUE NOTED. VALUE IS CONSISTENT WITH PREVIOUSLY REPORTED/CALLED VALUE.PMF (NOTE) Elevated high sensitivity troponin I (hsTnI) values and significant  changes across serial measurements may suggest ACS but many other  chronic and acute conditions are known to elevate hsTnI results.  Refer to the "Links" section for chest pain algorithms and additional  guidance. Performed at [facility], [address] [place], [place] [address]   APTT     Status: Abnormal   Collection Time: 03/11/21 11:32 PM  Result Value Ref Range   aPTT >200 (HH) 24 - 36 seconds    Comment:        IF BASELINE aPTT IS ELEVATED, SUGGEST PATIENT RISK ASSESSMENT BE USED TO DETERMINE APPROPRIATE ANTICOAGULANT THERAPY. CRITICAL RESULT CALLED TO, READ BACK BY AND VERIFIED WITHLarence Penning CLEMMONS AT 5329 03/12/21 MF Performed at [facility], [address] [place], [place] [address]   Basic metabolic panel     Status: Abnormal   Collection Time: 03/12/21  5:53 AM  Result Value Ref Range   Sodium 137 135 - 145 mmol/L   Potassium 4.1 3.5 - 5.1 mmol/L   Chloride 103 98 - 111 mmol/L   CO2 24 22 - 32 mmol/L   Glucose, Bld 88 70 - 99 mg/dL    Comment: Glucose reference range applies only to samples taken after fasting for at least 8 hours.   BUN 31 (H) 8 - 23 mg/dL   Creatinine, Ser 1.80 (H) 0.44 - 1.00 mg/dL   Calcium 9.5 8.9 - 10.3 mg/dL   GFR, Estimated 30 (L) >60 mL/min    Comment: (NOTE) Calculated using the CKD-EPI Creatinine Equation (2021)    Anion gap 10 5 - 15    Comment: Performed at [facility], [address] [place], [place] [address]  CBC     Status: Abnormal   Collection Time: 03/12/21  5:53 AM  Result Value Ref Range   WBC 7.3 4.0 - 10.5 K/uL   RBC 2.48 (L) 3.87 - 5.11 MIL/uL   Hemoglobin 7.6 (L) 12.0 - 15.0 g/dL   HCT 24.0 (L) 36.0 - 46.0 %   MCV 96.8 80.0 - 100.0 fL   MCH 30.6 26.0 - 34.0 pg   MCHC 31.7 30.0 - 36.0 g/dL   RDW 13.3 11.5 - 15.5 %   Platelets 80 (L) 150 - 400 K/uL    Comment: Immature Platelet Fraction may be clinically indicated, consider ordering this additional test [id number]    nRBC 0.0 0.0 - 0.2 %    Comment: Performed at [facility], [address] [place],  [address]  Procalcitonin     Status: None   Collection Time: 03/12/21  5:53 AM  Result Value Ref Range   Procalcitonin 46.36 ng/mL    Comment:        Interpretation: PCT >= 10 ng/mL: Important systemic inflammatory response, almost exclusively due to severe bacterial sepsis or septic shock. (NOTE)       Sepsis PCT Algorithm           Lower Respiratory Tract                                      Infection PCT Algorithm    ----------------------------     ----------------------------         PCT < 0.25 ng/mL                PCT < 0.10 ng/mL          Strongly encourage             Strongly discourage   discontinuation of antibiotics    initiation of antibiotics    ----------------------------     -----------------------------       PCT 0.25 - 0.50 ng/mL            PCT 0.10 - 0.25 ng/mL               OR       >80% decrease in PCT            Discourage initiation of  antibiotics      Encourage discontinuation           of antibiotics    ----------------------------     -----------------------------         PCT >= 0.50 ng/mL              PCT 0.26 - 0.50 ng/mL                AND       <80% decrease in PCT             Encourage initiation of                                             antibiotics       Encourage continuation            of antibiotics    ----------------------------     -----------------------------        PCT >= 0.50 ng/mL                  PCT > 0.50 ng/mL               AND         increase in PCT                  Strongly encourage                                      initiation of antibiotics    Strongly encourage escalation           of antibiotics                                     -----------------------------                                           PCT <= 0.25 ng/mL                                                 OR                                        > 80% decrease in PCT                                      Discontinue / Do not initiate                                             antibiotics  Performed at [facility], [address] [place], [place] [address]   Heparin level (unfractionated)     Status: Abnormal   Collection  Time: 03/12/21 10:44 AM  Result Value Ref Range   Heparin Unfractionated 1.67 (H) 0.30 - 0.70 IU/mL    Comment: (NOTE) If heparin results are below expected values, and patient dosage has  been confirmed, suggest follow up testing of antithrombin III levels. Performed at [facility], [address] [place], [place] [address]   APTT     Status: Abnormal   Collection Time: 03/12/21 10:44 AM  Result Value Ref Range   aPTT >200 (HH) 24 - 36 seconds    Comment:        IF BASELINE aPTT IS ELEVATED, SUGGEST PATIENT RISK ASSESSMENT BE USED TO DETERMINE APPROPRIATE ANTICOAGULANT THERAPY. CRITICAL RESULT CALLED TO, READ BACK BY AND VERIFIED WITH: P.COPELAND,RN AT 1135 ON 03/12/21 BY GM Performed at [facility], [address] [place], [place] [address]    DG Chest 2 View  Result Date: 03/11/2021 CLINICAL DATA:  Confusion EXAM: CHEST - 2 VIEW COMPARISON:  January 08, 2021 FINDINGS: Interstitium is mildly thickened. No edema or airspace opacity. Heart size and pulmonary vascularity are normal. No adenopathy. No bone lesions.  Surgical clips noted in gallbladder fossa region. IMPRESSION: Interstitial thickening, likely indicative of underlying chronic bronchitis. No edema or airspace opacity. Cardiac silhouette normal. Electronically Signed   By: Lowella Grip III M.D.   On: 03/11/2021 09:56   CT Head Wo Contrast  Result Date: 03/11/2021 CLINICAL DATA:  Altered mental status/confusion EXAM: CT HEAD WITHOUT CONTRAST TECHNIQUE: Contiguous axial images were obtained from the base of the skull through the vertex without intravenous contrast. COMPARISON:  February 22, 2021 FINDINGS: Brain: Moderate diffuse atrophy is stable. There is no intracranial mass, hemorrhage, extra-axial fluid collection, or midline shift. Decreased attenuation is noted with encephalomalacia in the medial right occipital lobe consistent with prior infarct. Prior infarct also noted in the posterosuperior right parietal lobe, stable. A prior infarct is noted in the posterior aspect of the mid left cerebellum, stable. There is mild decreased attenuation in portions of the centra semiovale bilaterally. No acute appearing infarct is evident. Vascular: There is no hyperdense vessel. There is calcification in each carotid siphon region. Skull: The bony calvarium appears intact. Sinuses/Orbits: There is slight mucosal thickening in several ethmoid air cells. Visualized orbits appear symmetric bilaterally. Other: Mastoid air cells are clear. IMPRESSION: Stable atrophy. Prior infarcts in the medial right occipital lobe, superior posterior right parietal lobe, and posterior mid left cerebellum. Decreased attenuation in the periventricular white matter is likely due to small vessel vascular disease, although a mild degree of interstitial edema secondary to the ventricular enlargement could present in this manner. No acute infarct evident. No mass or hemorrhage. There are foci of arterial vascular calcification. There is slight mucosal thickening in several ethmoid air cells.  Electronically Signed   By: Lowella Grip III M.D.   On: 03/11/2021 10:00   MR BRAIN WO CONTRAST  Result Date: 03/12/2021 CLINICAL DATA:  Confusion. Recent history of stroke. Hypercoagulable. EXAM: MRI HEAD WITHOUT CONTRAST TECHNIQUE: Multiplanar, multiecho pulse sequences of the brain and surrounding structures were obtained without intravenous contrast. COMPARISON:  12/26/2020 FINDINGS: Brain: Innumerable subcentimeter mainly cortically based acute infarcts along the bilateral cerebral convexity with predilection for the watershed regions. Similar size infarcts are seen in the bilateral cerebellum. Numerous pre-existing cerebellar and cortical infarcts with generalized brain atrophy. Large remote right occipital infarct with dense encephalomalacia. No acute hemorrhage, hydrocephalus, or mass. Vascular: Major flow voids are preserved Skull and upper cervical spine: Normal marrow signal  Sinuses/Orbits: Negative IMPRESSION: 1. Shower of embolic pattern acute infarcts involving the infra and supratentorial brain. 2. Extensive chronic ischemic injury. Electronically Signed   By: Monte Fantasia M.D.   On: 03/12/2021 11:52   CT Renal Stone Study  Result Date: 03/11/2021 CLINICAL DATA:  70 year old with chronic kidney disease, presenting with a urinary tract infection. EXAM: CT ABDOMEN AND PELVIS WITHOUT CONTRAST TECHNIQUE: Multidetector CT imaging of the abdomen and pelvis was performed following the standard protocol without IV contrast. COMPARISON:  01/09/2021. FINDINGS: Lower chest: LEFT pleural effusion and associated mild passive atelectasis in th, broad-based central disc protrusion and multifactorial spinal stenosis at L4-5, and diffuse degenerative changes e LEFT LOWER LOBE. Visualized lung bases otherwise clear. Heart moderately enlarged. Small pericardial effusion versus pericardial thickening. Hepatobiliary: Normal unenhanced appearance of the liver. Surgically absent gallbladder. No unexpected  biliary ductal dilation. Pancreas: Normal unenhanced appearance. Spleen: Subtle low attenuation involving the lower pole of the spleen, not present on the prior CT, with associated edema in the perisplenic fat. Adrenals/Urinary Tract: Normal appearing adrenal glands. Scarring and mild cortical thinning involving both kidneys. Allowing for the unenhanced technique, no significant focal parenchymal abnormalities involving either kidney. No hydronephrosis. No urinary tract calculi. Normal appearing mildly distended urinary bladder. Stomach/Bowel: Stomach normal in appearance for the degree of distention. Normal-appearing small bowel. Mobile cecum positioned in the RIGHT UPPER QUADRANT. Large rectal colonic stool burden and moderate colonic stool burden elsewhere. High attenuation ingested material within the colon and in the normal appearing appendix located in the RIGHT mid abdomen. No focal colonic abnormality. Vascular/Lymphatic: Mild atherosclerosis involving the abdominal aorta without evidence of aneurysm. No pathologic lymphadenopathy. Reproductive: Normal-appearing uterus and ovaries without evidence of adnexal mass. Other: Mild edema in the abdominal wall overlying both flanks, right greater than left. Musculoskeletal: Degenerative disc disease and spondylosis at L5-S1, broad-based central disc protrusion and multifactorial spinal stenosis at L4-5 and diffuse facet degenerative changes throughout the lumbar spine. No acute findings. IMPRESSION: 1. Subtle low attenuation involving the lower pole of the spleen, not present on the prior CT, with associated edema in the perisplenic fat, possibly indicating splenic infarct or splenic abscess. CT abdomen with contrast may be confirmatory. If the patient's renal function does not allow intravenous contrast administration, then ultrasound may be helpful in further evaluation. 2. No acute abnormalities otherwise involving the abdomen or pelvis. 3. LEFT pleural effusion  and associated mild passive atelectasis in the LEFT LOWER LOBE. 4. Small pericardial effusion versus pericardial thickening. 5. Aortic Atherosclerosis (ICD10-I70.0). Electronically Signed   By: Evangeline Dakin M.D.   On: 03/11/2021 12:12    Assessment: 70 y.o. female with a history of anti-phospholipid antibody syndrome and 2 prior cortically based ischemic infarctions, on Eliquis, presenting with confusion. MRI reveals multiple punctate acute ischemic infarctions in separate vascular territories infratentorially and supratentorially, many of which are cortically-based, most consistent with a cardioembolic etiology.  1. Exam reveals left homonymous hemianopsia and hyperreflexia, findings which are referable to her old strokes.  2. MRI brain reveals innumerable subcentimeter mainly cortically-based acute infarcts along the bilateral cerebral convexities. Similar size infarcts are seen in the bilateral cerebellum. Numerous pre-existing cerebellar and cortical infarcts with generalized brain atrophy are noted, including a large remote right occipital infarct with encephalomalacia and a medium sized old left frontal lobe cortically-based ischemic infarction.  3. EKG: NSR 4. MRA head from 12/26/20: Normal cerebral arteries except for chronic-appearing distal occlusion of the right PCA and moderate stenosis of the left P4 segment.  5. MRA neck from 12/26/20: No hemodynamically significant stenosis. 6. TTE 12/27/20: Left ventricular ejection fraction, by estimation, is 55 to 60%. The left ventricle has normal function. The left ventricle has no regional wall motion abnormalities. Left ventricular diastolic parameters are consistent with Grade I diastolic dysfunction (impaired relaxation). Left atrial size was mildly dilated. No mural thrombus described in the report.  7. Stroke Risk Factors - antiphospholipid antibody syndrome,  HLD, HTN, renal insufficiency and 2 prior strokes 8. Cardiology has consulted and  recommends switching back to Coumadin. She is currently being bridged with IV heparin.   Recommendations: 1. Agree with switching the patient back to Coumadin with heparin bridge from a risk/benefit standpoint. The multifocal strokes are small enough in size to have a relatively low risk of hemorrhagic conversion, but her risk of recurrent ischemic stroke is quite high.   2. PT consult, OT consult, Speech consult 3. Cardiac telemetry 4. May need repeat TTE to assess for possible mural thrombus.  5. Frequent neuro checks 6. Cardiology is following her troponin levels.  7. Agree that the patient may need Hematology evaluation given her APLA syndrome.  8. Agree with Cardiology that source of bleeding should be investigated given significant recent drop in the patient's hemoglobin.  9. BP management.  10. Continue atorvastatin.  11. Outpatient Neurology follow up.    @Electronically  signed: Dr. Kerney Elbe  03/12/2021, 1:03 PM

## 2021-03-12 NOTE — Progress Notes (Signed)
PT Cancellation Note  Patient Details Name: ASHLEYNICOLE MCCLEES MRN: [id number] DOB: [birth year]   Cancelled Treatment:    Reason Eval/Treat Not Completed: Patient at procedure or test/unavailable. Provider at the bedside speaking with patient and family. PT will continue with attempts as patient is available to participate.   Minna Merritts, PT, MPT  Percell Locus 03/12/2021, 3:26 PM

## 2021-03-12 NOTE — Consult Note (Signed)
[place] for Heparin Indication: chest pain/ACS  Allergies  Allergen Reactions  . Penicillin G Rash    Patient Measurements: Height: 5\' 8"  (172.7 cm) Weight: 83.5 kg (184 lb) IBW/kg (Calculated) : 63.9 Heparin Dosing Weight: 81 kg  Vital Signs: Temp: 98 F (36.7 C) (04/09 2334) Temp Source: Oral (04/09 2334) BP: 124/68 (04/09 2334) Pulse Rate: 72 (04/09 2334)  Labs: Recent Labs    03/11/21 0926 03/11/21 1323 03/11/21 1507 03/11/21 2332  HGB 8.6*  --   --   --   HCT 26.4*  --   --   --   PLT 104*  --   --   --   APTT  --   --  150* >200*  LABPROT  --   --  18.4*  --   INR  --   --  1.6*  --   HEPARINUNFRC  --   --  3.38*  --   CREATININE 2.03*  --   --   --   TROPONINIHS  --  543* 564*  --     Estimated Creatinine Clearance: 29.6 mL/min (A) (by C-G formula based on SCr of 2.03 mg/dL (H)).   Medical History: Past Medical History:  Diagnosis Date  . Arthritis 04/02/1996  . Depression   . GERD (gastroesophageal reflux disease) 04/03/2019  . Hyperlipidemia   . Hypertension   . Renal insufficiency   . Stroke ([place])   . Thyroid disease     Medications:  Medications Prior to Admission  Medication Sig Dispense Refill Last Dose  . acetaminophen (TYLENOL) 325 MG tablet Take 2 tablets (650 mg total) by mouth every 4 (four) hours as needed for mild pain (or temp > 37.5 C (99.5 F)).   Unknown at PRN  . apixaban (ELIQUIS) 5 MG TABS tablet Take 1 tablet (5 mg total) by mouth 2 (two) times daily. 60 tablet  03/10/2021 at 1800  . ascorbic acid (VITAMIN C) 500 MG tablet Take 500 mg by mouth 2 (two) times daily.     Marland Kitchen atorvastatin (LIPITOR) 20 MG tablet Take 1 tablet (20 mg total) by mouth daily. (Patient taking differently: Take 20 mg by mouth every evening.)   03/10/2021 at 1800  . escitalopram (LEXAPRO) 20 MG tablet Take 1 tablet (20 mg total) by mouth daily. 90 tablet 0 03/10/2021 at 0900  . famotidine (PEPCID) 20 MG tablet Take 20 mg  by mouth daily.     . ferrous sulfate 325 (65 FE) MG EC tablet Take 1 tablet (325 mg total) by mouth daily with breakfast. (Patient taking differently: Take 325 mg by mouth at bedtime.) 30 tablet 3   . levothyroxine (SYNTHROID) 100 MCG tablet Take 1 tablet (100 mcg total) by mouth daily at 6 (six) AM.   03/10/2021 at 0600  . loperamide (IMODIUM) 2 MG capsule Take 1 capsule (2 mg total) by mouth every 6 (six) hours as needed for diarrhea or loose stools. 30 capsule 0 Unknown at PRN  . montelukast (SINGULAIR) 10 MG tablet TAKE 1 TABLET BY MOUTH EVERYDAY AT BEDTIME (Patient taking differently: Take 10 mg by mouth at bedtime. TAKE 1 TABLET BY MOUTH EVERYDAY AT BEDTIME) 90 tablet 3 03/10/2021 at 2000  . ondansetron (ZOFRAN-ODT) 4 MG disintegrating tablet Take 4 mg by mouth every 6 (six) hours as needed for nausea or vomiting.   Unknown at PRN  . oxybutynin (DITROPAN XL) 15 MG 24 hr tablet Take 1 tablet (15 mg total)  by mouth at bedtime.   03/10/2021 at 2000  . sodium chloride (OCEAN) 0.65 % SOLN nasal spray Place 2 sprays into both nostrils every 6 (six) hours.     . traMADol (ULTRAM) 50 MG tablet Take 50 mg by mouth every 6 (six) hours.     . traZODone (DESYREL) 100 MG tablet Take 1 tablet (100 mg total) by mouth at bedtime.   03/10/2021 at 2000  . vitamin B-12 (CYANOCOBALAMIN) 1000 MCG tablet Take 1,000 mcg by mouth daily.     Marland Kitchen buPROPion (WELLBUTRIN XL) 150 MG 24 hr tablet Take 1 tablet (150 mg total) by mouth daily. (Patient not taking: No sig reported) 30 tablet 0 Not Taking at Unknown time  . diclofenac Sodium (VOLTAREN) 1 % GEL Apply 2 g topically 4 (four) times daily as needed (left knee pain). (Patient not taking: No sig reported)   Not Taking at Unknown time  . neomycin-bacitracin-polymyxin (NEOSPORIN) OINT Apply 1 application topically 2 (two) times daily. (Patient not taking: No sig reported)   Not Taking at Unknown time   Scheduled:  . atorvastatin  20 mg Oral QHS  . buPROPion  150 mg Oral Daily  .  escitalopram  20 mg Oral Daily  . famotidine  20 mg Oral Daily  . ferrous sulfate  325 mg Oral QHS  . levothyroxine  100 mcg Oral Q0600  . mouth rinse  15 mL Mouth Rinse BID  . melatonin  5 mg Oral QHS  . montelukast  10 mg Oral QHS  . multivitamin with minerals   Oral Daily  . oxybutynin  15 mg Oral QHS  . vitamin B-12  1,000 mcg Oral Daily   Infusions:  . azithromycin 500 mg (03/12/21 0041)  . cefTRIAXone (ROCEPHIN)  IV    . lactated ringers 100 mL/hr at 03/11/21 1858   PRN: acetaminophen **OR** acetaminophen, morphine injection, ondansetron **OR** ondansetron (ZOFRAN) IV, oxyCODONE, traZODone Anti-infectives (From admission, onward)   Start     Dose/Rate Route Frequency Ordered Stop   03/12/21 1000  cefTRIAXone (ROCEPHIN) 1 g in sodium chloride 0.9 % 100 mL IVPB        1 g 200 mL/hr over 30 Minutes Intravenous Every 24 hours 03/11/21 2223     03/11/21 2315  azithromycin (ZITHROMAX) 500 mg in sodium chloride 0.9 % 250 mL IVPB        500 mg 250 mL/hr over 60 Minutes Intravenous Every 24 hours 03/11/21 2223     03/11/21 1100  cefTRIAXone (ROCEPHIN) 1 g in sodium chloride 0.9 % 100 mL IVPB        1 g 200 mL/hr over 30 Minutes Intravenous  Once 03/11/21 1056 03/11/21 1131      Assessment: Pharmacy consulted for ACS. Trop elevated at 543. On apixaban PTA. Last dose 4/8 per med rec. Heparin level, aptt, and INR ordered.   Goal of Therapy:  Heparin level 0.3-0.7 units/ml once aPTT and heparin level correlate.  aPTT 66-102 seconds Monitor platelets by anticoagulation protocol: Yes   4/9 1507 aPTT 150 4/9 2332 aPTT >200, supratherapeutic  Plan:  Contacted lab earlier regarding delay of aPTT lvl.  Was informed by lab tech, they were having equipment issues and were having to rerun some labs.    Spoke with RN, hold drip for 1 hour Restart heparin infusion at 700 units/hr Recheck aPTT level in 6 hours after restart Continue to monitor H&H and platelets  Renda Rolls,  PharmD, [facility] 03/12/2021 3:43 AM

## 2021-03-12 NOTE — Significant Event (Addendum)
Rapid Response Event Note   Reason for Call : RRT called for confusion of keeping stroke pt vs sending to another unit.   Initial Focused Assessment: Pt awake, alert, sitting up in bed, VSS.      Interventions: MRI showing old infarcts, nothing acute. Spoke with Dr. Barb Merino... Will send to 1C to do modified stroke assessments.   Plan of Care: as above    Event Summary: Kathryn Sousa RN, and [name], charge RN to call if further assistance needed.  MD Notified: 1400 Dr Sloan Leiter Call Time:1346 Arrival [id number] End Time:1400  [name] A, RN

## 2021-03-12 NOTE — NC FL2 (Signed)
[place] LEVEL OF CARE SCREENING TOOL     IDENTIFICATION  Patient Name: [name] [facility] Birthdate: Mar 03, 1951 Sex: female Admission Date (Current Location): 03/11/2021  [place] and [place] Number:  Engineering geologist and Address:  [facility], [address], [place], [place] [address]      Provider Number: [id number]  Attending Physician Name and Address:  Barb Merino, MD  Relative Name and Phone Number:  [place], [place] (Daughter)   [phone] [facility] Phone)    Current Level of Care: Hospital Recommended Level of Care: [place] Prior Approval Number:    Date Approved/Denied:   PASRR Number: [id number]  Discharge Plan:      Current Diagnoses: Patient Active Problem List   Diagnosis Date Noted  . Encephalopathy 03/12/2021  . AKI (acute kidney injury) ([place]) 03/11/2021  . Pressure injury of skin 03/11/2021  . Fever   . Acute blood loss anemia   . Neurogenic bladder   . 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 Ischemic cerebrovascular accident (CVA) of frontal lobe ([place]) 12/29/2020  . CVA (cerebral vascular accident) ([place]) 12/27/2020  . Acute CVA (cerebrovascular accident) ([place]) 12/26/2020  . Thrombocytopenia ([place]) 12/26/2020  . Antiphospholipid antibody syndrome ([place]) 02/10/2020  . Acute kidney failure ([place]) 11/04/2019  . Edema of lower extremity 11/04/2019  . Stage 3 chronic kidney disease ([place]) 11/04/2019  . Popliteal bursitis of left knee 08/25/2019  . Familial hypercholesterolemia 08/25/2019  . Renal insufficiency 08/25/2019  . Low hemoglobin 08/25/2019  . History of essential hypertension 08/25/2019  . Seasonal allergic rhinitis due to pollen 02/24/2019  . Age-related osteoporosis without current pathological fracture 01/20/2018  . Osteoporosis 11/12/2017  . Urinary incontinence 05/28/2017  . Knee pain 05/08/2017  . Muscle weakness 05/08/2017  . Sprain of MCL (medial  collateral ligament) of knee 05/08/2017  . Adult hypothyroidism 10/23/2016  . Essential hypertension 10/23/2016  . Gastroesophageal reflux disease without esophagitis 10/23/2016  . Recurrent major depressive disorder, in full remission ([place]) 10/23/2016  . Anticoagulant long-term use 10/23/2016    Orientation RESPIRATION BLADDER Height & Weight     Self,Situation,Place  Normal External catheter Weight: 184 lb (83.5 kg) Height:  5\' 8"  (172.7 cm)  BEHAVIORAL SYMPTOMS/MOOD NEUROLOGICAL BOWEL NUTRITION STATUS        Diet (regular diet, thin liquids)  AMBULATORY STATUS COMMUNICATION OF NEEDS Skin   Extensive Assist Verbally  (stage 2 L buttocks)                       Personal Care Assistance Level of Assistance  Bathing,Feeding,Dressing Bathing Assistance: Maximum assistance Feeding assistance: Limited assistance Dressing Assistance: Maximum assistance     Functional Limitations Info             SPECIAL CARE FACTORS FREQUENCY  PT (By licensed PT),OT (By licensed OT)     PT Frequency: 5 x/week OT Frequency: 5 x/week            Contractures      Additional Factors Info  Code Status,Allergies Code Status Info: DNR Allergies Info: penicillin G           Current Medications (03/12/2021):  This is the current hospital active medication list Current Facility-Administered Medications  Medication Dose Route Frequency Provider Last Rate Last Admin  .  stroke: mapping our early stages of recovery book   Does not apply Once Barb Merino, MD      . acetaminophen (TYLENOL) tablet 1,000 mg  1,000 mg Oral Q6H PRN Cox, Amy N, DO   1,000 mg at 03/12/21 1313   Or  . acetaminophen (TYLENOL) suppository 650 mg  650 mg Rectal Q6H PRN Cox, Amy N, DO      . atorvastatin (LIPITOR) tablet 20 mg  20 mg Oral QHS Cox, Amy N, DO   20 mg at 03/11/21 2213  . buPROPion (WELLBUTRIN XL) 24 hr tablet 150 mg  150 mg Oral Daily Cox, Amy N, DO   150 mg at 03/12/21 0857  . cefTRIAXone  (ROCEPHIN) 1 g in sodium chloride 0.9 % 100 mL IVPB  1 g Intravenous Q24H Cox, Amy N, DO 200 mL/hr at 03/12/21 0902 1 g at 03/12/21 0902  . escitalopram (LEXAPRO) tablet 20 mg  20 mg Oral Daily Cox, Amy N, DO   20 mg at 03/12/21 0857  . famotidine (PEPCID) tablet 20 mg  20 mg Oral Daily Cox, Amy N, DO   20 mg at 03/12/21 0857  . ferrous sulfate tablet 325 mg  325 mg Oral QHS Cox, Amy N, DO   325 mg at 03/11/21 2213  . heparin ADULT infusion 100 units/mL (25000 units/260mL)  500 Units/hr Intravenous Continuous Barb Merino, MD 5 mL/hr at 03/12/21 1319 500 Units/hr at 03/12/21 1319  . lactated ringers infusion   Intravenous Continuous Barb Merino, MD 100 mL/hr at 03/12/21 1323 New Bag at 03/12/21 1323  . levothyroxine (SYNTHROID) tablet 100 mcg  100 mcg Oral Q0600 Cox, Amy N, DO      . MEDLINE mouth rinse  15 mL Mouth Rinse BID Cox, Amy N, DO   15 mL at 03/12/21 0858  . melatonin tablet 5 mg  5 mg Oral QHS Cox, Amy N, DO   5 mg at 03/11/21 2213  . montelukast (SINGULAIR) tablet 10 mg  10 mg Oral QHS Cox, Amy N, DO   10 mg at 03/11/21 2213  . morphine 2 MG/ML injection 0.5 mg  0.5 mg Intravenous Q2H PRN Cox, Amy N, DO      . multivitamin with minerals tablet   Oral Daily Cox, Amy N, DO   1 tablet at 03/12/21 0857  . ondansetron (ZOFRAN) tablet 4 mg  4 mg Oral Q6H PRN Cox, Amy N, DO       Or  . ondansetron (ZOFRAN) injection 4 mg  4 mg Intravenous Q6H PRN Cox, Amy N, DO      . oxybutynin (DITROPAN-XL) 24 hr tablet 15 mg  15 mg Oral QHS Cox, Amy N, DO   15 mg at 03/11/21 2323  . oxyCODONE (Oxy IR/ROXICODONE) immediate release tablet 5 mg  5 mg Oral Q8H PRN Cox, Amy N, DO   5 mg at 03/12/21 3007  . traZODone (DESYREL) tablet 100 mg  100 mg Oral QHS PRN Cox, Amy N, DO      . vitamin B-12 (CYANOCOBALAMIN) tablet 1,000 mcg  1,000 mcg Oral Daily Cox, Amy N, DO   1,000 mcg at 03/12/21 0857  . warfarin (COUMADIN) tablet 2 mg  2 mg Oral ONCE-1600 Oswald Hillock, RPH      . Warfarin - Pharmacist Dosing  Inpatient   Does not apply [place], [facility]         Discharge Medications: Please see discharge summary for a list of discharge medications.  Relevant Imaging Results:  Relevant Lab Results:   Additional Information Referral for short term rehab and plan to transition to LTC. LTC Medicaid pending. Patient's spouse also  needing LTC, and has LTC Medicaid. They would like patient and spouse to go to SNF together in same room.  SS #: [place], LCSW

## 2021-03-12 NOTE — TOC Initial Note (Addendum)
Transition of Care [facility]) - Initial/Assessment Note    Patient Details  Name: [name] MRN: [id number] Date of Birth: 16-Sep-1951  Transition [facility] Care [facility]) CM/SW Contact:    Magnus Ivan, LCSW Phone Number: 03/12/2021, 2:28 PM  Clinical Narrative:                Per chart patient is disoriented. Spoke with daughter [facility]. Hillary reported patient and patient's husband have been at [facility] in [place]. Patient uses a wheelchair since she had a stroke in January. Patient has had both COVID vaccines as well as the booster.  Hillary reported they would like both patient and patient's husband to transition to a different SNF. They have been working with SW at [facility]) on this, but would like assistance from hospital if possible. Janett Billow reported they sent a referral to [facility] in [facility] yesterday but are waiting to hear back. Their top preferences are [facility] and [facility]. Patient's long term care Medicaid is pending, and Hillary would like patient to go for SNF rehab and transition to long term care. Patient's husband is already under long term Medicaid. Hillary understands CSW cannot send/work on patient's husbands information since he is not a patient.  Asked Ebony Hail at [facility] to review referral in [place]. [facility], Admissions (Tina/Nicole) not available on weekends, but faxed referral.  Expected Discharge Plan: Skilled Nursing Facility Barriers to Discharge: Continued Medical Work up   Patient Goals and CMS Choice Patient states their goals for this hospitalization and ongoing recovery are:: SNF rehab and transition to LTC CMS Medicare.gov Compare Post Acute Care list provided to:: Patient Represenative (must comment) Choice offered to / list presented to : Adult Children  Expected Discharge Plan and Services Expected Discharge Plan: [place] arrangements for the past  2 months: [place]                                      Prior Living Arrangements/Services Living arrangements for the past 2 months: [place] Lives with:: Facility Resident,Spouse Patient language and need for interpreter reviewed:: Yes Do you feel safe going back to the place where you live?: Yes      Need for Family Participation in Patient Care: Yes (Comment) Care giver support system in place?: Yes (comment) Current home services: DME Criminal Activity/Legal Involvement Pertinent to Current Situation/Hospitalization: No - Comment as needed  Activities of Daily Living Home Assistive Devices/Equipment: Bedside commode/3-in-1,Hospital bed (lives in snf) ADL Screening (condition at time of admission) Patient's cognitive ability adequate to safely complete daily activities?: Yes Is the patient deaf or have difficulty hearing?: No Does the patient have difficulty seeing, even when wearing glasses/contacts?: No Does the patient have difficulty concentrating, remembering, or making decisions?: No Patient able to express need for assistance with ADLs?: Yes Does the patient have difficulty dressing or bathing?: Yes Independently performs ADLs?: No Communication: Independent Dressing (OT): Needs assistance Is this a change from baseline?: Pre-admission baseline Grooming: Needs assistance Is this a change from baseline?: Pre-admission baseline Feeding: Independent Bathing: Needs assistance Is this a change from baseline?: Pre-admission baseline Toileting: Needs assistance Is this a change from baseline?: Pre-admission baseline In/Out Bed: Dependent Is this a change from baseline?: Pre-admission baseline Walks in Home: Needs assistance Is this a change from baseline?: Pre-admission  baseline Does the patient have difficulty walking or climbing stairs?: Yes Weakness of Legs: Both Weakness of Arms/Hands: None  Permission  Sought/Granted Permission sought to share information with : Chartered certified accountant granted to share information with : Yes, Verbal Permission Granted (by daughter)     Permission granted to share info w AGENCY: SNFs        Emotional Assessment       Orientation: : Fluctuating Orientation (Suspected and/or reported Sundowners) Alcohol / Substance Use: Not Applicable Psych Involvement: No (comment)  Admission diagnosis:  Dehydration [E86.0] Confusion [R41.0] Acute cystitis with hematuria [N30.01] AKI (acute kidney injury) ([place]) [S28.7] Acute metabolic encephalopathy [G81.15] Encephalopathy [G93.40] Patient Active Problem List   Diagnosis Date Noted  . Encephalopathy 03/12/2021  . AKI (acute kidney injury) ([place]) 03/11/2021  . Pressure injury of skin 03/11/2021  . Fever   . Acute blood loss anemia   . Neurogenic bladder   . 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 Ischemic cerebrovascular accident (CVA) of frontal lobe ([place]) 12/29/2020  . CVA (cerebral vascular accident) ([place]) 12/27/2020  . Acute CVA (cerebrovascular accident) ([place]) 12/26/2020  . Thrombocytopenia ([place]) 12/26/2020  . Antiphospholipid antibody syndrome ([place]) 02/10/2020  . Acute kidney failure ([place]) 11/04/2019  . Edema of lower extremity 11/04/2019  . Stage 3 chronic kidney disease ([place]) 11/04/2019  . Popliteal bursitis of left knee 08/25/2019  . Familial hypercholesterolemia 08/25/2019  . Renal insufficiency 08/25/2019  . Low hemoglobin 08/25/2019  . History of essential hypertension 08/25/2019  . Seasonal allergic rhinitis due to pollen 02/24/2019  . Age-related osteoporosis without current pathological fracture 01/20/2018  . Osteoporosis 11/12/2017  . Urinary incontinence 05/28/2017  . Knee pain 05/08/2017  . Muscle weakness 05/08/2017  . Sprain of MCL (medial collateral ligament) of knee 05/08/2017  . Adult hypothyroidism 10/23/2016  . Essential hypertension  10/23/2016  . Gastroesophageal reflux disease without esophagitis 10/23/2016  . Recurrent major depressive disorder, in full remission ([place]) 10/23/2016  . Anticoagulant long-term use 10/23/2016   PCP:  Juline Patch, MD Pharmacy:   [id number] - [place], [place] [address] Phone: [phone] Fax: [place], [place] [address] [place] [address] Phone: [phone] Fax: [phone]     Social Determinants of Health (SDOH) Interventions    Readmission Risk Interventions No flowsheet data found.

## 2021-03-12 NOTE — Consult Note (Signed)
Cardiology Consultation Note    Patient ID: [name], MRN: [id number], DOB/AGE: 1951/08/21 70 y.o. Admit date: 03/11/2021   Date of Consult: 03/12/2021 Primary Physician: Juline Patch, MD Primary Cardiologist: none  Chief Complaint: Confusion Reason for Consultation: elevated troponin Requesting MD: Dr. Sloan Leiter  HPI: [name] is a 70 y.o. female with history of hypertension, history of right trimalleoli are fracture, hypothyroid, history of DVT and antiphospholipid antibody syndrome previously on Coumadin, CKD 3B, hyperlipidemia, depression, mild cognitive decline, history of CVA, right PCA infarct, presents emergency department for chief concerns of confusion. This has apparently been there for several weeks. Brain MRI done today revealed evidence of shower of embolic pattern acute infarcts invovling the infr and suprtentorial brain.  Patient had been on warfarin for quite a few years and then had difficulty managing the levels and had a CVA felt to be secondary to an adequate INR and was converted to Eliquis.  As mentioned above over the past several weeks there has been altered mental status and confusion.  She was brought to the emergency room where laboratories revealed acute on chronic renal insufficiency with a creatinine up to 2.03 sodium 132, hemoglobin of 7.6 which has been gradually declining from a value of 10.32 months ago.  Protocol serum troponins were drawn showed High-sensitivity troponins of 543 and 564.  EKG showed sinus rhythm with no ischemia.  No arrhythmia.  Patient denied chest pain.  Patient was placed on heparin Eliquis stopped due to the elevated troponins.  She was placed on a statin.  Brain CT done yesterday in emergency room showed prior infarct of the right occipital lobe superior posterior right parietal lobe and posterior mid left cerebellum.  No acute infarct or bleeding was noted.  Brain MRI done today showed shower of embolic pattern acute  infarcts involving the infra and supratentorial brain.  Extensive chronic ischemic injury.  These were bilateral.  Patient had an echo in January showing normal EF.  No shunt by color-flow Doppler.  Patient now lives in a care facility.  She has been compliant with her Eliquis. Past Medical History:  Diagnosis Date  . Arthritis 04/02/1996  . Depression   . GERD (gastroesophageal reflux disease) 04/03/2019  . Hyperlipidemia   . Hypertension   . Renal insufficiency   . Stroke ([place])   . Thyroid disease       Surgical History:  Past Surgical History:  Procedure Laterality Date  . CESAREAN SECTION    . CHOLECYSTECTOMY    . COLONOSCOPY  2012   repeat in 10 yrs  . KNEE ARTHROSCOPY W/ MENISCAL REPAIR Left      Home Meds: Prior to Admission medications   Medication Sig Start Date End Date Taking? Authorizing Provider  acetaminophen (TYLENOL) 325 MG tablet Take 2 tablets (650 mg total) by mouth every 4 (four) hours as needed for mild pain (or temp > 37.5 C (99.5 F)). 01/19/21  Yes Angiulli, Lavon Paganini, PA-C  apixaban (ELIQUIS) 5 MG TABS tablet Take 1 tablet (5 mg total) by mouth 2 (two) times daily. 01/19/21  Yes Angiulli, Lavon Paganini, PA-C  ascorbic acid (VITAMIN C) 500 MG tablet Take 500 mg by mouth 2 (two) times daily.   Yes [provider]  atorvastatin (LIPITOR) 20 MG tablet Take 1 tablet (20 mg total) by mouth daily. Patient taking differently: Take 20 mg by mouth every evening. 01/20/21  Yes Angiulli, Lavon Paganini, PA-C  escitalopram (LEXAPRO) 20 MG tablet Take 1  tablet (20 mg total) by mouth daily. 12/12/20  Yes Juline Patch, MD  famotidine (PEPCID) 20 MG tablet Take 20 mg by mouth daily.   Yes [provider]  ferrous sulfate 325 (65 FE) MG EC tablet Take 1 tablet (325 mg total) by mouth daily with breakfast. Patient taking differently: Take 325 mg by mouth at bedtime. 12/12/20  Yes Juline Patch, MD  levothyroxine (SYNTHROID) 100 MCG tablet Take 1 tablet (100 mcg total) by  mouth daily at 6 (six) AM. 12/30/20  Yes Lorella Nimrod, MD  loperamide (IMODIUM) 2 MG capsule Take 1 capsule (2 mg total) by mouth every 6 (six) hours as needed for diarrhea or loose stools. 12/29/20  Yes Lorella Nimrod, MD  montelukast (SINGULAIR) 10 MG tablet TAKE 1 TABLET BY MOUTH EVERYDAY AT BEDTIME Patient taking differently: Take 10 mg by mouth at bedtime. TAKE 1 TABLET BY MOUTH EVERYDAY AT BEDTIME 03/11/21  Yes Juline Patch, MD  ondansetron (ZOFRAN-ODT) 4 MG disintegrating tablet Take 4 mg by mouth every 6 (six) hours as needed for nausea or vomiting.   Yes [provider]  oxybutynin (DITROPAN XL) 15 MG 24 hr tablet Take 1 tablet (15 mg total) by mouth at bedtime. 01/19/21  Yes Angiulli, Lavon Paganini, PA-C  sodium chloride (OCEAN) 0.65 % SOLN nasal spray Place 2 sprays into both nostrils every 6 (six) hours.   Yes [provider]  traMADol (ULTRAM) 50 MG tablet Take 50 mg by mouth every 6 (six) hours.   Yes [provider]  traZODone (DESYREL) 100 MG tablet Take 1 tablet (100 mg total) by mouth at bedtime. 01/19/21  Yes Angiulli, Lavon Paganini, PA-C  vitamin B-12 (CYANOCOBALAMIN) 1000 MCG tablet Take 1,000 mcg by mouth daily.   Yes [provider]  buPROPion (WELLBUTRIN XL) 150 MG 24 hr tablet Take 1 tablet (150 mg total) by mouth daily. Patient not taking: No sig reported 02/08/21   Juline Patch, MD  diclofenac Sodium (VOLTAREN) 1 % GEL Apply 2 g topically 4 (four) times daily as needed (left knee pain). Patient not taking: No sig reported 01/19/21   Angiulli, Lavon Paganini, PA-C  neomycin-bacitracin-polymyxin (NEOSPORIN) OINT Apply 1 application topically 2 (two) times daily. Patient not taking: No sig reported 01/19/21   Cathlyn Parsons, PA-C    Inpatient Medications:  . atorvastatin  20 mg Oral QHS  . buPROPion  150 mg Oral Daily  . escitalopram  20 mg Oral Daily  . famotidine  20 mg Oral Daily  . ferrous sulfate  325 mg Oral QHS  . levothyroxine  100 mcg Oral  Q0600  . mouth rinse  15 mL Mouth Rinse BID  . melatonin  5 mg Oral QHS  . montelukast  10 mg Oral QHS  . multivitamin with minerals   Oral Daily  . oxybutynin  15 mg Oral QHS  . vitamin B-12  1,000 mcg Oral Daily   . cefTRIAXone (ROCEPHIN)  IV 1 g (03/12/21 0902)  . heparin Stopped (03/12/21 1105)    Allergies:  Allergies  Allergen Reactions  . Penicillin G Rash    Social History   Socioeconomic History  . Marital status: Married    Spouse name: Not on file  . Number of children: 1  . Years of education: Not on file  . Highest education level: Master's degree (e.g., MA, MS, MEng, MEd, MSW, MBA)  Occupational History  . Occupation: Retired  Tobacco Use  . Smoking status: Never Smoker  .  Smokeless tobacco: Never Used  . Tobacco comment: none  Vaping Use  . Vaping Use: Never used  Substance and Sexual Activity  . Alcohol use: Yes    Alcohol/week: 1.0 standard drink    Types: 1 Glasses of wine per week    Comment: occasional drink  . Drug use: No  . Sexual activity: Not Currently    Birth control/protection: Post-menopausal  Other Topics Concern  . Not on file  Social History Narrative  . Not on file   Social Determinants of Health   Financial Resource Strain: Not on file  Food Insecurity: Not on file  Transportation Needs: Not on file  Physical Activity: Not on file  Stress: Not on file  Social Connections: Not on file  Intimate Partner Violence: Not on file     Family History  Problem Relation Age of Onset  . Stroke Father   . Breast cancer Maternal Grandmother   . Kidney cancer Mother   . Cancer Mother   . Arthritis Brother      Review of Systems: A 12-system review of systems was performed and is negative except as noted in the HPI.  Labs: No results for input(s): CKTOTAL, CKMB, TROPONINI in the last 72 hours. Lab Results  Component Value Date   WBC 7.3 03/12/2021   HGB 7.6 (L) 03/12/2021   HCT 24.0 (L) 03/12/2021   MCV 96.8 03/12/2021    PLT 80 (L) 03/12/2021    Recent Labs  Lab 03/11/21 0926 03/12/21 0553  NA 132* 137  K 3.9 4.1  CL 101 103  CO2 25 24  BUN 35* 31*  CREATININE 2.03* 1.80*  CALCIUM 10.0 9.5  PROT 7.2  --   BILITOT 0.9  --   ALKPHOS 262*  --   ALT 44  --   AST 46*  --   GLUCOSE 112* 88   Lab Results  Component Value Date   CHOL 158 12/27/2020   HDL 56 12/27/2020   LDLCALC 87 12/27/2020   TRIG 77 12/27/2020   No results found for: DDIMER  Radiology/Studies:  DG Chest 2 View  Result Date: 03/11/2021 CLINICAL DATA:  Confusion EXAM: CHEST - 2 VIEW COMPARISON:  January 08, 2021 FINDINGS: Interstitium is mildly thickened. No edema or airspace opacity. Heart size and pulmonary vascularity are normal. No adenopathy. No bone lesions. Surgical clips noted in gallbladder fossa region. IMPRESSION: Interstitial thickening, likely indicative of underlying chronic bronchitis. No edema or airspace opacity. Cardiac silhouette normal. Electronically Signed   By: Lowella Grip III M.D.   On: 03/11/2021 09:56   DG Ankle Complete Right  Result Date: 02/22/2021 CLINICAL DATA:  Right ankle pain, fell EXAM: RIGHT ANKLE - COMPLETE 3+ VIEW COMPARISON:  05/23/2020 FINDINGS: Frontal, oblique, lateral views of the right ankle are obtained. There is an oblique displaced lateral malleolar fracture, with lateral subluxation of the talus relative to the tibial plafond. A minimally comminuted transverse medial malleolar fracture is also noted, with mild distraction. The minimally displaced fracture through the posterior malleolus is noted on lateral view. There is diffuse soft tissue swelling. Large inferior calcaneal spur. IMPRESSION: 1. Trimalleolar right ankle fracture, with lateral subluxation of the talus. 2. Diffuse soft tissue swelling. Electronically Signed   By: Randa Ngo M.D.   On: 02/22/2021 20:41   CT Head Wo Contrast  Result Date: 03/11/2021 CLINICAL DATA:  Altered mental status/confusion EXAM: CT HEAD  WITHOUT CONTRAST TECHNIQUE: Contiguous axial images were obtained from the base of the skull  through the vertex without intravenous contrast. COMPARISON:  February 22, 2021 FINDINGS: Brain: Moderate diffuse atrophy is stable. There is no intracranial mass, hemorrhage, extra-axial fluid collection, or midline shift. Decreased attenuation is noted with encephalomalacia in the medial right occipital lobe consistent with prior infarct. Prior infarct also noted in the posterosuperior right parietal lobe, stable. A prior infarct is noted in the posterior aspect of the mid left cerebellum, stable. There is mild decreased attenuation in portions of the centra semiovale bilaterally. No acute appearing infarct is evident. Vascular: There is no hyperdense vessel. There is calcification in each carotid siphon region. Skull: The bony calvarium appears intact. Sinuses/Orbits: There is slight mucosal thickening in several ethmoid air cells. Visualized orbits appear symmetric bilaterally. Other: Mastoid air cells are clear. IMPRESSION: Stable atrophy. Prior infarcts in the medial right occipital lobe, superior posterior right parietal lobe, and posterior mid left cerebellum. Decreased attenuation in the periventricular white matter is likely due to small vessel vascular disease, although a mild degree of interstitial edema secondary to the ventricular enlargement could present in this manner. No acute infarct evident. No mass or hemorrhage. There are foci of arterial vascular calcification. There is slight mucosal thickening in several ethmoid air cells. Electronically Signed   By: Lowella Grip III M.D.   On: 03/11/2021 10:00   CT Head Wo Contrast  Result Date: 02/22/2021 CLINICAL DATA:  [place]. EXAM: CT HEAD WITHOUT CONTRAST CT CERVICAL SPINE WITHOUT CONTRAST TECHNIQUE: Multidetector CT imaging of the head and cervical spine was performed following the standard protocol without intravenous contrast. Multiplanar CT image  reconstructions of the cervical spine were also generated. COMPARISON:  None. FINDINGS: CT HEAD FINDINGS Brain: Age advanced cerebral atrophy and associated ventriculomegaly. Evidence of multiple prior infarcts most notably in the right occipital lobe with associated encephalomalacia and ex vacuo dilatation of the occipital horn of the right lateral ventricle. I do not see any findings suggestive of an acute hemispheric infarction. No intracranial hemorrhage. No extra-axial fluid collections. Vascular: Vascular calcifications but no definite aneurysm or hyperdense vessels. Skull: No skull fracture or bone lesions. Sinuses/Orbits: The paranasal sinuses and mastoid air cells are clear. The globes are intact. Other: No scalp lesions or scalp hematoma. CT CERVICAL SPINE FINDINGS Alignment: Normal overall alignment of the cervical vertebral bodies. Skull base and vertebrae: No acute fracture. No primary bone lesion or focal pathologic process. Soft tissues and spinal canal: No prevertebral fluid or swelling. No visible canal hematoma. Disc levels: Degenerative cervical spondylosis with disc disease and facet disease most notable at C4-5 and C5-6. No large disc protrusions or significant spinal stenosis. Uncinate spurring and facet disease but no significant foraminal stenosis. Upper chest: The visualized lung apices are grossly clear. Other: No neck mass, adenopathy or hematoma. IMPRESSION: 1. Age advanced cerebral atrophy and associated ventriculomegaly. 2. Evidence of multiple prior infarcts. 3. No acute intracranial findings or skull fracture. 4. Degenerative cervical spondylosis with disc disease and facet disease but no acute cervical spine fracture. Electronically Signed   By: Marijo Sanes M.D.   On: 02/22/2021 21:52   CT Cervical Spine Wo Contrast  Result Date: 02/22/2021 CLINICAL DATA:  [place]. EXAM: CT HEAD WITHOUT CONTRAST CT CERVICAL SPINE WITHOUT CONTRAST TECHNIQUE: Multidetector CT imaging of the head and  cervical spine was performed following the standard protocol without intravenous contrast. Multiplanar CT image reconstructions of the cervical spine were also generated. COMPARISON:  None. FINDINGS: CT HEAD FINDINGS Brain: Age advanced cerebral atrophy and associated ventriculomegaly. Evidence of multiple  prior infarcts most notably in the right occipital lobe with associated encephalomalacia and ex vacuo dilatation of the occipital horn of the right lateral ventricle. I do not see any findings suggestive of an acute hemispheric infarction. No intracranial hemorrhage. No extra-axial fluid collections. Vascular: Vascular calcifications but no definite aneurysm or hyperdense vessels. Skull: No skull fracture or bone lesions. Sinuses/Orbits: The paranasal sinuses and mastoid air cells are clear. The globes are intact. Other: No scalp lesions or scalp hematoma. CT CERVICAL SPINE FINDINGS Alignment: Normal overall alignment of the cervical vertebral bodies. Skull base and vertebrae: No acute fracture. No primary bone lesion or focal pathologic process. Soft tissues and spinal canal: No prevertebral fluid or swelling. No visible canal hematoma. Disc levels: Degenerative cervical spondylosis with disc disease and facet disease most notable at C4-5 and C5-6. No large disc protrusions or significant spinal stenosis. Uncinate spurring and facet disease but no significant foraminal stenosis. Upper chest: The visualized lung apices are grossly clear. Other: No neck mass, adenopathy or hematoma. IMPRESSION: 1. Age advanced cerebral atrophy and associated ventriculomegaly. 2. Evidence of multiple prior infarcts. 3. No acute intracranial findings or skull fracture. 4. Degenerative cervical spondylosis with disc disease and facet disease but no acute cervical spine fracture. Electronically Signed   By: Marijo Sanes M.D.   On: 02/22/2021 21:52   CT Renal Stone Study  Result Date: 03/11/2021 CLINICAL DATA:  70 year old with  chronic kidney disease, presenting with a urinary tract infection. EXAM: CT ABDOMEN AND PELVIS WITHOUT CONTRAST TECHNIQUE: Multidetector CT imaging of the abdomen and pelvis was performed following the standard protocol without IV contrast. COMPARISON:  01/09/2021. FINDINGS: Lower chest: LEFT pleural effusion and associated mild passive atelectasis in th, broad-based central disc protrusion and multifactorial spinal stenosis at L4-5, and diffuse degenerative changes e LEFT LOWER LOBE. Visualized lung bases otherwise clear. Heart moderately enlarged. Small pericardial effusion versus pericardial thickening. Hepatobiliary: Normal unenhanced appearance of the liver. Surgically absent gallbladder. No unexpected biliary ductal dilation. Pancreas: Normal unenhanced appearance. Spleen: Subtle low attenuation involving the lower pole of the spleen, not present on the prior CT, with associated edema in the perisplenic fat. Adrenals/Urinary Tract: Normal appearing adrenal glands. Scarring and mild cortical thinning involving both kidneys. Allowing for the unenhanced technique, no significant focal parenchymal abnormalities involving either kidney. No hydronephrosis. No urinary tract calculi. Normal appearing mildly distended urinary bladder. Stomach/Bowel: Stomach normal in appearance for the degree of distention. Normal-appearing small bowel. Mobile cecum positioned in the RIGHT UPPER QUADRANT. Large rectal colonic stool burden and moderate colonic stool burden elsewhere. High attenuation ingested material within the colon and in the normal appearing appendix located in the RIGHT mid abdomen. No focal colonic abnormality. Vascular/Lymphatic: Mild atherosclerosis involving the abdominal aorta without evidence of aneurysm. No pathologic lymphadenopathy. Reproductive: Normal-appearing uterus and ovaries without evidence of adnexal mass. Other: Mild edema in the abdominal wall overlying both flanks, right greater than left.  Musculoskeletal: Degenerative disc disease and spondylosis at L5-S1, broad-based central disc protrusion and multifactorial spinal stenosis at L4-5 and diffuse facet degenerative changes throughout the lumbar spine. No acute findings. IMPRESSION: 1. Subtle low attenuation involving the lower pole of the spleen, not present on the prior CT, with associated edema in the perisplenic fat, possibly indicating splenic infarct or splenic abscess. CT abdomen with contrast may be confirmatory. If the patient's renal function does not allow intravenous contrast administration, then ultrasound may be helpful in further evaluation. 2. No acute abnormalities otherwise involving the abdomen or pelvis.  3. LEFT pleural effusion and associated mild passive atelectasis in the LEFT LOWER LOBE. 4. Small pericardial effusion versus pericardial thickening. 5. Aortic Atherosclerosis (ICD10-I70.0). Electronically Signed   By: Evangeline Dakin M.D.   On: 03/11/2021 12:12    Wt Readings from Last 3 Encounters:  03/11/21 83.5 kg  02/22/21 79.4 kg  12/30/20 83.4 kg    EKG: nsr   Physical Exam:  Blood pressure 125/87, pulse 98, temperature 98.4 F (36.9 C), temperature source Oral, resp. rate 18, height 5\' 8"  (1.727 m), weight 83.5 kg, SpO2 96 %. Body mass index is 27.98 kg/m. General: Head: Normocephalic, atraumatic, sclera non-icteric, no xanthomas, nares are without discharge.  Neck: Negative for carotid bruits. JVD not elevated. Lungs: Clear bilaterally to auscultation without wheezes, rales, or rhonchi. Breathing is unlabored. Heart: RRR Abdomen: Soft, non-tender, non-distended with normoactive bowel sounds. No hepatomegaly. No rebound/guarding. No obvious abdominal masses. Msk:  Strength and tone appear normal for age. Extremities: No clubbing or cyanosis. No edema.  Distal pedal pulses are 2+ and equal bilaterally. Neuro:Confused     Assessment and Plan  70 y.o. female with history of hypertension,history of  right trimalleoli are fracture,hypothyroid, history of DVT and antiphospholipid antibody syndrome previously on Coumadin, CKD 3B, hyperlipidemia, depression, mild cognitive decline, history of CVA, right PCA infarct, presents emergency department for chief concerns of confusion. This has apparently been there for several weeks. Brain MRI shows shower of emboli. In nsr at present andshe has been compliant with eliquis. Will need change to warfarin. WIll need neurology input regading heparin for now. Will follow for afib on monitor. Consider echo with bubble study Although right to left shunting unlikely and was not seen on color doppler with echo several months ago.   Recommendations;  1 continue with IV heparin, begin on warfarin unless contraindicated from neurology recommendations.  INR goal between 2 and 3.  Failure of Eliquis which apparently been taken adequately.  2.  Neurology evaluation.  3.  May need hematology  evaluation given antiphospholipid antibody syndrome.  4.  Anemia-patient's hemoglobin is dropped significantly recently.  Will need to evaluate for source of bleeding.  Signed, Teodoro Spray MD 03/12/2021, 11:09 AM Pager: [phone]  has been on eliquis and if compliant , will need change to warfarin. WIll need neurology input regading heparin for now. Will follow for afib on monitor. Will need echo with bubble study  Recommendations;  1 continue with IV heparin, begin on warfarin unless contraindicated from neurology recommendations.  INR goal between 2 and 3.  Failure of Eliquis which apparently been taken adequately.  2.  Neurology evaluation.  3.  May need hematology  evaluation given antiphospholipid antibody syndrome.  4.  Anemia-patient's hemoglobin is dropped significantly recently.  Will need to evaluate for source of bleeding.

## 2021-03-12 NOTE — Progress Notes (Signed)
OT Cancellation Note  Patient Details Name: [name] MRN: [id number] DOB: [birth year]   Cancelled Treatment:    Reason Eval/Treat Not Completed: Patient at procedure or test/ unavailable. Orders received and chart reviewed. Upon arrival to room, provider at bedside speaking with pt and family. OT to attempt at later time/date as able.  Fredirick Maudlin, OTR/[place]

## 2021-03-12 NOTE — Progress Notes (Signed)
1330: Dr. Sloan Leiter aware of bladder scan of 286, orders received to in and out cath for more than 500. Okay to continue Regular diet patient did not pass swallow screen, but able to tap sips of water and breaks to drink.  1530: Dr. Sloan Leiter aware of left lower quadrant pain, one time dose of Fentanyl given patient tolerated well. MD was at the bedside talking to family.

## 2021-03-12 NOTE — Consult Note (Addendum)
[place] for Heparin + warfarin  Indication: chest pain/ACS, embolic stroke, and antiphospholipid antibody  Allergies  Allergen Reactions  . Penicillin G Rash    Patient Measurements: Height: 5\' 8"  (172.7 cm) Weight: 83.5 kg (184 lb) IBW/kg (Calculated) : 63.9 Heparin Dosing Weight: 81 kg  Vital Signs: Temp: 98.6 F (37 C) (04/10 1209) Temp Source: Oral (04/10 1209) BP: 148/75 (04/10 1209) Pulse Rate: 88 (04/10 1209)  Labs: Recent Labs    03/11/21 0926 03/11/21 1323 03/11/21 1507 03/11/21 2332 03/12/21 0553 03/12/21 1044  HGB 8.6*  --   --   --  7.6*  --   HCT 26.4*  --   --   --  24.0*  --   PLT 104*  --   --   --  80*  --   APTT  --   --  150* >200*  --  >200*  LABPROT  --   --  18.4*  --   --   --   INR  --   --  1.6*  --   --   --   HEPARINUNFRC  --   --  3.38*  --   --  1.67*  CREATININE 2.03*  --   --   --  1.80*  --   TROPONINIHS  --  543* 564*  --   --   --     Estimated Creatinine Clearance: 33.4 mL/min (A) (by C-G formula based on SCr of 1.8 mg/dL (H)).   Medical History: Past Medical History:  Diagnosis Date  . Arthritis 04/02/1996  . Depression   . GERD (gastroesophageal reflux disease) 04/03/2019  . Hyperlipidemia   . Hypertension   . Renal insufficiency   . Stroke ([place])   . Thyroid disease     Medications:  Medications Prior to Admission  Medication Sig Dispense Refill Last Dose  . acetaminophen (TYLENOL) 325 MG tablet Take 2 tablets (650 mg total) by mouth every 4 (four) hours as needed for mild pain (or temp > 37.5 C (99.5 F)).   Unknown at PRN  . apixaban (ELIQUIS) 5 MG TABS tablet Take 1 tablet (5 mg total) by mouth 2 (two) times daily. 60 tablet  03/10/2021 at 1800  . ascorbic acid (VITAMIN C) 500 MG tablet Take 500 mg by mouth 2 (two) times daily.     Marland Kitchen atorvastatin (LIPITOR) 20 MG tablet Take 1 tablet (20 mg total) by mouth daily. (Patient taking differently: Take 20 mg by mouth every evening.)    03/10/2021 at 1800  . escitalopram (LEXAPRO) 20 MG tablet Take 1 tablet (20 mg total) by mouth daily. 90 tablet 0 03/10/2021 at 0900  . famotidine (PEPCID) 20 MG tablet Take 20 mg by mouth daily.     . ferrous sulfate 325 (65 FE) MG EC tablet Take 1 tablet (325 mg total) by mouth daily with breakfast. (Patient taking differently: Take 325 mg by mouth at bedtime.) 30 tablet 3   . levothyroxine (SYNTHROID) 100 MCG tablet Take 1 tablet (100 mcg total) by mouth daily at 6 (six) AM.   03/10/2021 at 0600  . loperamide (IMODIUM) 2 MG capsule Take 1 capsule (2 mg total) by mouth every 6 (six) hours as needed for diarrhea or loose stools. 30 capsule 0 Unknown at PRN  . montelukast (SINGULAIR) 10 MG tablet TAKE 1 TABLET BY MOUTH EVERYDAY AT BEDTIME (Patient taking differently: Take 10 mg by mouth at bedtime. TAKE 1 TABLET BY  MOUTH EVERYDAY AT BEDTIME) 90 tablet 3 03/10/2021 at 2000  . ondansetron (ZOFRAN-ODT) 4 MG disintegrating tablet Take 4 mg by mouth every 6 (six) hours as needed for nausea or vomiting.   Unknown at PRN  . oxybutynin (DITROPAN XL) 15 MG 24 hr tablet Take 1 tablet (15 mg total) by mouth at bedtime.   03/10/2021 at 2000  . sodium chloride (OCEAN) 0.65 % SOLN nasal spray Place 2 sprays into both nostrils every 6 (six) hours.     . traMADol (ULTRAM) 50 MG tablet Take 50 mg by mouth every 6 (six) hours.     . traZODone (DESYREL) 100 MG tablet Take 1 tablet (100 mg total) by mouth at bedtime.   03/10/2021 at 2000  . vitamin B-12 (CYANOCOBALAMIN) 1000 MCG tablet Take 1,000 mcg by mouth daily.     Marland Kitchen buPROPion (WELLBUTRIN XL) 150 MG 24 hr tablet Take 1 tablet (150 mg total) by mouth daily. (Patient not taking: No sig reported) 30 tablet 0 Not Taking at Unknown time  . diclofenac Sodium (VOLTAREN) 1 % GEL Apply 2 g topically 4 (four) times daily as needed (left knee pain). (Patient not taking: No sig reported)   Not Taking at Unknown time  . neomycin-bacitracin-polymyxin (NEOSPORIN) OINT Apply 1 application  topically 2 (two) times daily. (Patient not taking: No sig reported)   Not Taking at Unknown time   Scheduled:  . atorvastatin  20 mg Oral QHS  . buPROPion  150 mg Oral Daily  . escitalopram  20 mg Oral Daily  . famotidine  20 mg Oral Daily  . ferrous sulfate  325 mg Oral QHS  . levothyroxine  100 mcg Oral Q0600  . mouth rinse  15 mL Mouth Rinse BID  . melatonin  5 mg Oral QHS  . montelukast  10 mg Oral QHS  . multivitamin with minerals   Oral Daily  . oxybutynin  15 mg Oral QHS  . vitamin B-12  1,000 mcg Oral Daily   Infusions:  . cefTRIAXone (ROCEPHIN)  IV 1 g (03/12/21 0902)  . heparin Stopped (03/12/21 1105)  . lactated ringers     PRN: acetaminophen **OR** acetaminophen, morphine injection, ondansetron **OR** ondansetron (ZOFRAN) IV, oxyCODONE, traZODone Anti-infectives (From admission, onward)   Start     Dose/Rate Route Frequency Ordered Stop   03/12/21 1000  cefTRIAXone (ROCEPHIN) 1 g in sodium chloride 0.9 % 100 mL IVPB        1 g 200 mL/hr over 30 Minutes Intravenous Every 24 hours 03/11/21 2223     03/11/21 2315  azithromycin (ZITHROMAX) 500 mg in sodium chloride 0.9 % 250 mL IVPB  Status:  Discontinued        500 mg 250 mL/hr over 60 Minutes Intravenous Every 24 hours 03/11/21 2223 03/12/21 0842   03/11/21 1100  cefTRIAXone (ROCEPHIN) 1 g in sodium chloride 0.9 % 100 mL IVPB        1 g 200 mL/hr over 30 Minutes Intravenous  Once 03/11/21 1056 03/11/21 1131      Assessment: Pharmacy consulted for ACS. Trop elevated at 543.  She was on Coumadin prior to her stroke, and while she was inpatient in [facility] acute rehab her consulting physician switched her over to Eliquis. Now, medical team is switching patient back to warfarin. Patient was on prescribed warfarin prior to apixaban for antiphospholipid antibody syndrome. Prior notes state goal INR was 2.5 to 3.5. Dr. Ubaldo Glassing recommended a goal of 2 to 3. Will aim  for an INR goal of 2.5 to 3, while inpatient. DDI: APAP  (increase bleeding), escitalopram (increase bleeding), hypothroidism (decreases effect of warfarin). NO major DDIs.   Home regimen in January 2022 was warfarin 2mg : Tu,Th, Sa,Su Warfarin 3mg : M,W,F.   Date INR Warfarin Dose  4/9 1.6   4/10 -- 2 mg   Heparin monitoring 4/9 1507 aPTT 150 4/9 2332 aPTT >200, supratherapeutic 4/10 1044 aPTT > 200, HL 1.67.   Goal of Therapy:  Heparin level 0.3-0.7 units/ml once aPTT and heparin level correlate.  aPTT 66-102 seconds INR: 2.5 - 3.  Monitor platelets by anticoagulation protocol: Yes    Plan:  Heparin:  APTT is supratherapeutic. Will hold heparin infusion for 2 hours and restart at 500 units/hr. Recheck heparin level in 6 hours from infusion start. CBC daily while on heparin. Switch to heparin level monitoring once aPTT and heparin level correlate.  Warfarin:   INR is subtherapeutic. Will give warfarin 2 mg x 1 tonight. Daily INR ordered.   Eleonore Chiquito, PharmD, BCPS 03/12/2021 12:12 PM

## 2021-03-12 NOTE — Consult Note (Signed)
[place] for Heparin + warfarin  Indication: chest pain/ACS, embolic stroke, and antiphospholipid antibody  Allergies  Allergen Reactions  . Penicillin G Rash    Patient Measurements: Height: 5\' 8"  (172.7 cm) Weight: 83.5 kg (184 lb) IBW/kg (Calculated) : 63.9 Heparin Dosing Weight: 81 kg  Vital Signs: Temp: 98.4 F (36.9 C) (04/10 2013) Temp Source: Oral (04/10 2013) BP: 160/97 (04/10 2013) Pulse Rate: 108 (04/10 2013)  Labs: Recent Labs    03/11/21 0926 03/11/21 1323 03/11/21 1507 03/11/21 1507 03/11/21 2332 03/12/21 0553 03/12/21 1044 03/12/21 1852  HGB 8.6*  --   --   --   --  7.6*  --   --   HCT 26.4*  --   --   --   --  24.0*  --   --   PLT 104*  --   --   --   --  80*  --   --   APTT  --   --  150*   < > >200*  --  >200* >160*  LABPROT  --   --  18.4*  --   --   --   --   --   INR  --   --  1.6*  --   --   --   --   --   HEPARINUNFRC  --   --  3.38*  --   --   --  1.67*  --   CREATININE 2.03*  --   --   --   --  1.80*  --   --   TROPONINIHS  --  543* 564*  --   --   --   --   --    < > = values in this interval not displayed.    Estimated Creatinine Clearance: 33.4 mL/min (A) (by C-G formula based on SCr of 1.8 mg/dL (H)).   Medical History: Past Medical History:  Diagnosis Date  . Arthritis 04/02/1996  . Depression   . GERD (gastroesophageal reflux disease) 04/03/2019  . Hyperlipidemia   . Hypertension   . Renal insufficiency   . Stroke ([place])   . Thyroid disease     Medications:  Medications Prior to Admission  Medication Sig Dispense Refill Last Dose  . acetaminophen (TYLENOL) 325 MG tablet Take 2 tablets (650 mg total) by mouth every 4 (four) hours as needed for mild pain (or temp > 37.5 C (99.5 F)).   Unknown at PRN  . apixaban (ELIQUIS) 5 MG TABS tablet Take 1 tablet (5 mg total) by mouth 2 (two) times daily. 60 tablet  03/10/2021 at 1800  . ascorbic acid (VITAMIN C) 500 MG tablet Take 500 mg by mouth  2 (two) times daily.     Marland Kitchen atorvastatin (LIPITOR) 20 MG tablet Take 1 tablet (20 mg total) by mouth daily. (Patient taking differently: Take 20 mg by mouth every evening.)   03/10/2021 at 1800  . escitalopram (LEXAPRO) 20 MG tablet Take 1 tablet (20 mg total) by mouth daily. 90 tablet 0 03/10/2021 at 0900  . famotidine (PEPCID) 20 MG tablet Take 20 mg by mouth daily.     . ferrous sulfate 325 (65 FE) MG EC tablet Take 1 tablet (325 mg total) by mouth daily with breakfast. (Patient taking differently: Take 325 mg by mouth at bedtime.) 30 tablet 3   . levothyroxine (SYNTHROID) 100 MCG tablet Take 1 tablet (100 mcg total) by mouth daily at 6 (  six) AM.   03/10/2021 at 0600  . loperamide (IMODIUM) 2 MG capsule Take 1 capsule (2 mg total) by mouth every 6 (six) hours as needed for diarrhea or loose stools. 30 capsule 0 Unknown at PRN  . montelukast (SINGULAIR) 10 MG tablet TAKE 1 TABLET BY MOUTH EVERYDAY AT BEDTIME (Patient taking differently: Take 10 mg by mouth at bedtime. TAKE 1 TABLET BY MOUTH EVERYDAY AT BEDTIME) 90 tablet 3 03/10/2021 at 2000  . ondansetron (ZOFRAN-ODT) 4 MG disintegrating tablet Take 4 mg by mouth every 6 (six) hours as needed for nausea or vomiting.   Unknown at PRN  . oxybutynin (DITROPAN XL) 15 MG 24 hr tablet Take 1 tablet (15 mg total) by mouth at bedtime.   03/10/2021 at 2000  . sodium chloride (OCEAN) 0.65 % SOLN nasal spray Place 2 sprays into both nostrils every 6 (six) hours.     . traMADol (ULTRAM) 50 MG tablet Take 50 mg by mouth every 6 (six) hours.     . traZODone (DESYREL) 100 MG tablet Take 1 tablet (100 mg total) by mouth at bedtime.   03/10/2021 at 2000  . vitamin B-12 (CYANOCOBALAMIN) 1000 MCG tablet Take 1,000 mcg by mouth daily.     Marland Kitchen buPROPion (WELLBUTRIN XL) 150 MG 24 hr tablet Take 1 tablet (150 mg total) by mouth daily. (Patient not taking: No sig reported) 30 tablet 0 Not Taking at Unknown time  . diclofenac Sodium (VOLTAREN) 1 % GEL Apply 2 g topically 4 (four) times  daily as needed (left knee pain). (Patient not taking: No sig reported)   Not Taking at Unknown time  . neomycin-bacitracin-polymyxin (NEOSPORIN) OINT Apply 1 application topically 2 (two) times daily. (Patient not taking: No sig reported)   Not Taking at Unknown time   Scheduled:  . atorvastatin  20 mg Oral QHS  . buPROPion  150 mg Oral Daily  . escitalopram  20 mg Oral Daily  . famotidine  20 mg Oral Daily  . ferrous sulfate  325 mg Oral QHS  . levothyroxine  100 mcg Oral Q0600  . mouth rinse  15 mL Mouth Rinse BID  . melatonin  5 mg Oral QHS  . montelukast  10 mg Oral QHS  . multivitamin with minerals   Oral Daily  . oxybutynin  15 mg Oral QHS  . vitamin B-12  1,000 mcg Oral Daily  . Warfarin - Pharmacist Dosing Inpatient   Does not apply q1600   Infusions:  . cefTRIAXone (ROCEPHIN)  IV Stopped (03/12/21 3662)  . heparin    . lactated ringers 100 mL/hr at 03/12/21 1323   PRN: acetaminophen **OR** acetaminophen, fentaNYL (SUBLIMAZE) injection, ondansetron **OR** ondansetron (ZOFRAN) IV, oxyCODONE, traZODone Anti-infectives (From admission, onward)   Start     Dose/Rate Route Frequency Ordered Stop   03/12/21 1000  cefTRIAXone (ROCEPHIN) 1 g in sodium chloride 0.9 % 100 mL IVPB        1 g 200 mL/hr over 30 Minutes Intravenous Every 24 hours 03/11/21 2223     03/11/21 2315  azithromycin (ZITHROMAX) 500 mg in sodium chloride 0.9 % 250 mL IVPB  Status:  Discontinued        500 mg 250 mL/hr over 60 Minutes Intravenous Every 24 hours 03/11/21 2223 03/12/21 0842   03/11/21 1100  cefTRIAXone (ROCEPHIN) 1 g in sodium chloride 0.9 % 100 mL IVPB        1 g 200 mL/hr over 30 Minutes Intravenous  Once 03/11/21 1056 03/11/21  1131      Assessment: Pharmacy consulted for ACS. Trop elevated at 543.  She was on Coumadin prior to her stroke, and while she was inpatient in [facility] acute rehab her consulting physician switched her over to Eliquis. Now, medical team is switching patient back to  warfarin. Patient was on prescribed warfarin prior to apixaban for antiphospholipid antibody syndrome. Prior notes state goal INR was 2.5 to 3.5. Dr. Ubaldo Glassing recommended a goal of 2 to 3. Will aim for an INR goal of 2.5 to 3, while inpatient. DDI: APAP (increase bleeding), escitalopram (increase bleeding), hypothroidism (decreases effect of warfarin). NO major DDIs.   Home regimen in January 2022 was warfarin 2mg : Tu,Th, Sa,Su Warfarin 3mg : M,W,F.   Date INR Warfarin Dose  4/9 1.6   4/10 -- 2 mg   Heparin monitoring 4/9 1507 aPTT 150 4/9 2332 aPTT >200, supratherapeutic 4/10 1044 aPTT > 200, HL 1.67.  4/10 1852 aPTT > 160  Goal of Therapy:  Heparin level 0.3-0.7 units/ml once aPTT and heparin level correlate.  aPTT 66-102 seconds INR: 2.5 - 3.  Monitor platelets by anticoagulation protocol: Yes    Plan:  Heparin:  APTT is supratherapeutic. Will hold heparin infusion for 1 hours and restart at 350 units/hr. Recheck aPTT and  heparin level in 6 hours from infusion restart. CBC daily while on heparin. Switch to heparin level monitoring once aPTT and heparin level correlate.  Warfarin:   INR is subtherapeutic. Will give warfarin 2 mg x 1 tonight. Daily INR ordered.   Paulina Fusi, PharmD, BCPS 03/12/2021 9:13 PM

## 2021-03-12 NOTE — Progress Notes (Signed)
Patient transferred to 111 on 1C, family at the bedside, and Horris Latino, Therapist, sports was 2nd verifier for new tele, & bed alarm set. [place], RN aware of transfer.

## 2021-03-12 NOTE — Progress Notes (Signed)
PROGRESS NOTE    [name]  HYQ:[id number] DOB: [birth year] DOA: 03/11/2021 PCP: Juline Patch, MD    Brief Narrative:  70 year old female with history of hypertension, hypothyroidism, history of DVT and antiphospholipid syndrome who was previously on Coumadin for 15 years and recently changed to Eliquis, chronic kidney disease stage IIIb with baseline creatinine 1.8, hyperlipidemia, depression, cognitive decline, recent history of stroke and rehab, recent right ankle fracture brought back from nursing home with altered mental status.  Recently hospitalization for stroke, rehab and discharged to the nursing home.  She has been remaining in poor health since then, appetite is poor.  Patient was complaining of midsternal chest pain for about 2 days.  Both spouse at long-term nursing home now. In the emergency room, patient is confused.  Hemodynamically stable.  Sodium 132.  Creatinine 2.03 mildly elevated from baseline otherwise most of the findings negative.  COVID-19 negative.  UA consistent with large leukocytes.  CT head with multiple previous infarctions but no acute findings.   Assessment & Plan:   Active Problems:   Adult hypothyroidism   Essential hypertension   Anticoagulant long-term use   Antiphospholipid antibody syndrome (HCC)   Stage 3 chronic kidney disease (HCC)   Thrombocytopenia (HCC)   CVA (cerebral vascular accident) ([place])   Ischemic cerebrovascular accident (CVA) of frontal lobe (HCC)   Embolism of left anterior cerebral artery   AKI (acute kidney injury) ([place])   Pressure injury of skin   Encephalopathy  Acute metabolic encephalopathy in a patient with multiple medical issues: Ammonia normal.  Nonfocal exam except with previous right hemiparesis. Rule out acute infective encephalopathy, currently on antibiotics for presumed UTI. Will evaluate for any recurrence of stroke, MRI brain today.  Symptomatic treatment. Speech, PT and OT.  Suspected UTI: Patient  with recurrent UTI.  Urinalysis abnormal.  Previous Citrobacter.  Procalcitonin very high with no other explanation of infection.  Continue Rocephin pending final urine cultures.  Elevated troponin: With no evidence of ongoing chest pain.  EKG nonischemic.  Patient is currently on heparin.  Will be seen by cardiology, follow-up recommendations.  Acute kidney injury on chronic kidney disease stage IIIb: Patient was treated with IV fluids.  Renal functions improved.  Her baseline creatinine reported about 1.8.  Antiphospholipid antibody syndrome/hypercoagulable condition/recent stroke with history of paroxysmal A. Fib: Repeat MRI today as above. Sinus rhythm and rate controlled. Patient was on Eliquis, currently on heparin with elevated troponins. If her repeat MRI shows new strokes, will change Eliquis to Coumadin as she had remained stable on Coumadin for last 15 years.  Right ankle fracture: Conservative management.  Nonweightbearing on cast.  Hypothyroidism: On Synthroid that we will continue.  Anemia of chronic disease: Hemoglobin at about baseline.  Will check iron panels.  Recent B12 was normal.   DVT prophylaxis: Place TED hose Start: 03/11/21 1309   Code Status: DNR Family Communication: Patient's daughter at the bedside, patient's sister on the phone Disposition Plan: Status is: Inpatient  Remains inpatient appropriate because:Altered mental status, IV treatments appropriate due to intensity of illness or inability to take PO and Inpatient level of care appropriate due to severity of illness   Dispo: The patient is from: SNF              Anticipated d/c is to: SNF              Patient currently is not medically stable to d/c.   Difficult to place patient No  Consultants:   Cardiology  Procedures:   None  Antimicrobials:   Rocephin 4/9---   Subjective: Patient seen and examined.  Her daughter was at the bedside.  Patient is pleasantly confused and  currently denies any problems.  She is thirsty and hungry.  Patient was not sure why she is in the hospital.  Objective: Vitals:   03/11/21 2001 03/11/21 2334 03/12/21 0505 03/12/21 0848  BP: 121/62 124/68 123/71 125/87  Pulse: 77 72 85 98  Resp: 20  (!) 22 18  Temp: (!) 97.5 F (36.4 C) 98 F (36.7 C) 98.5 F (36.9 C) 98.4 F (36.9 C)  TempSrc: Oral Oral Oral Oral  SpO2: 96% 96% 95% 96%  Weight:      Height:        Intake/Output Summary (Last 24 hours) at 03/12/2021 1138 Last data filed at 03/12/2021 0500 Gross per 24 hour  Intake 106.87 ml  Output 500 ml  Net -393.13 ml   Filed Weights   03/11/21 0921  Weight: 83.5 kg    Examination:  General exam: Appears calm and comfortable  Frail and debilitated.  Chronically sick looking but not in any distress. Patient is alert and awake but oriented to person and her family.  Not oriented to place and time. Respiratory system: Clear to auscultation. Respiratory effort normal. Cardiovascular system: S1 & S2 heard, RRR. No JVD, murmurs, rubs, gallops or clicks. No pedal edema. Gastrointestinal system: Abdomen is nondistended, soft and nontender. No organomegaly or masses felt. Normal bowel sounds heard. 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  Right eye vision is intact.  Right upper arm is weaker than left.  Unable to move right lower leg with cast.  She has generalized weakness on the left lower leg.    Data Reviewed: I have personally reviewed following labs and imaging studies  CBC: Recent Labs  Lab 03/11/21 0926 03/12/21 0553  WBC 8.5 7.3  NEUTROABS 6.3  --   HGB 8.6* 7.6*  HCT 26.4* 24.0*  MCV 94.6 96.8  PLT 104* 80*   Basic Metabolic Panel: Recent Labs  Lab 03/11/21 0926 03/12/21 0553  NA 132* 137  K 3.9 4.1  CL 101 103  CO2 25 24  GLUCOSE 112* 88  BUN 35* 31*  CREATININE 2.03* 1.80*  CALCIUM 10.0 9.5   GFR: Estimated Creatinine Clearance: 33.4 mL/min (A) (by C-G formula based on SCr of  1.8 mg/dL (H)). Liver Function Tests: Recent Labs  Lab 03/11/21 0926  AST 46*  ALT 44  ALKPHOS 262*  BILITOT 0.9  PROT 7.2  ALBUMIN 2.7*   No results for input(s): LIPASE, AMYLASE in the last 168 hours. Recent Labs  Lab 03/11/21 1323  AMMONIA <9*   Coagulation Profile: Recent Labs  Lab 03/11/21 1507  INR 1.6*   Cardiac Enzymes: No results for input(s): CKTOTAL, CKMB, CKMBINDEX, TROPONINI in the last 168 hours. BNP (last 3 results) No results for input(s): PROBNP in the last 8760 hours. HbA1C: No results for input(s): HGBA1C in the last 72 hours. CBG: No results for input(s): GLUCAP in the last 168 hours. Lipid Profile: No results for input(s): CHOL, HDL, LDLCALC, TRIG, CHOLHDL, LDLDIRECT in the last 72 hours. Thyroid Function Tests: No results for input(s): TSH, T4TOTAL, FREET4, T3FREE, THYROIDAB in the last 72 hours. Anemia Panel: No results for input(s): VITAMINB12, FOLATE, FERRITIN, TIBC, IRON, RETICCTPCT in the last 72 hours. Sepsis Labs: Recent Labs  Lab 03/11/21 1323 03/12/21 0553  PROCALCITON 59.38 46.36  Recent Results (from the past 240 hour(s))  SARS CORONAVIRUS 2 (TAT 6-24 HRS) Nasopharyngeal Nasopharyngeal Swab     Status: None   Collection Time: 03/11/21 11:05 AM   Specimen: Nasopharyngeal Swab  Result Value Ref Range Status   SARS Coronavirus 2 NEGATIVE NEGATIVE Final    Comment: (NOTE) SARS-CoV-2 target nucleic acids are NOT DETECTED.  The SARS-CoV-2 RNA is generally detectable in upper and lower respiratory specimens during the acute phase of infection. Negative results do not preclude SARS-CoV-2 infection, do not rule out co-infections with other pathogens, and should not be used as the sole basis for treatment or other patient management decisions. Negative results must be combined with clinical observations, patient history, and epidemiological information. The expected result is Negative.  Fact Sheet for  Patients: SugarRoll.be  Fact Sheet for Healthcare Providers: https://www.woods-mathews.com/  This test is not yet approved or cleared by the [place] FDA and  has been authorized for detection and/or diagnosis of SARS-CoV-2 by FDA under an Emergency Use Authorization (EUA). This EUA will remain  in effect (meaning this test can be used) for the duration of the COVID-19 declaration under Se ction 564(b)(1) of the Act, 21 U.S.C. section 360bbb-3(b)(1), unless the authorization is terminated or revoked sooner.  Performed at [facility], [place] [address] [place],  [address]          Radiology Studies: DG Chest 2 View  Result Date: 03/11/2021 CLINICAL DATA:  Confusion EXAM: CHEST - 2 VIEW COMPARISON:  January 08, 2021 FINDINGS: Interstitium is mildly thickened. No edema or airspace opacity. Heart size and pulmonary vascularity are normal. No adenopathy. No bone lesions. Surgical clips noted in gallbladder fossa region. IMPRESSION: Interstitial thickening, likely indicative of underlying chronic bronchitis. No edema or airspace opacity. Cardiac silhouette normal. Electronically Signed   By: Lowella Grip III M.D.   On: 03/11/2021 09:56   CT Head Wo Contrast  Result Date: 03/11/2021 CLINICAL DATA:  Altered mental status/confusion EXAM: CT HEAD WITHOUT CONTRAST TECHNIQUE: Contiguous axial images were obtained from the base of the skull through the vertex without intravenous contrast. COMPARISON:  February 22, 2021 FINDINGS: Brain: Moderate diffuse atrophy is stable. There is no intracranial mass, hemorrhage, extra-axial fluid collection, or midline shift. Decreased attenuation is noted with encephalomalacia in the medial right occipital lobe consistent with prior infarct. Prior infarct also noted in the posterosuperior right parietal lobe, stable. A prior infarct is noted in the posterior aspect of the mid left cerebellum, stable. There  is mild decreased attenuation in portions of the centra semiovale bilaterally. No acute appearing infarct is evident. Vascular: There is no hyperdense vessel. There is calcification in each carotid siphon region. Skull: The bony calvarium appears intact. Sinuses/Orbits: There is slight mucosal thickening in several ethmoid air cells. Visualized orbits appear symmetric bilaterally. Other: Mastoid air cells are clear. IMPRESSION: Stable atrophy. Prior infarcts in the medial right occipital lobe, superior posterior right parietal lobe, and posterior mid left cerebellum. Decreased attenuation in the periventricular white matter is likely due to small vessel vascular disease, although a mild degree of interstitial edema secondary to the ventricular enlargement could present in this manner. No acute infarct evident. No mass or hemorrhage. There are foci of arterial vascular calcification. There is slight mucosal thickening in several ethmoid air cells. Electronically Signed   By: Lowella Grip III M.D.   On: 03/11/2021 10:00   CT Renal Stone Study  Result Date: 03/11/2021 CLINICAL DATA:  70 year old with chronic kidney disease,  presenting with a urinary tract infection. EXAM: CT ABDOMEN AND PELVIS WITHOUT CONTRAST TECHNIQUE: Multidetector CT imaging of the abdomen and pelvis was performed following the standard protocol without IV contrast. COMPARISON:  01/09/2021. FINDINGS: Lower chest: LEFT pleural effusion and associated mild passive atelectasis in th, broad-based central disc protrusion and multifactorial spinal stenosis at L4-5, and diffuse degenerative changes e LEFT LOWER LOBE. Visualized lung bases otherwise clear. Heart moderately enlarged. Small pericardial effusion versus pericardial thickening. Hepatobiliary: Normal unenhanced appearance of the liver. Surgically absent gallbladder. No unexpected biliary ductal dilation. Pancreas: Normal unenhanced appearance. Spleen: Subtle low attenuation involving the  lower pole of the spleen, not present on the prior CT, with associated edema in the perisplenic fat. Adrenals/Urinary Tract: Normal appearing adrenal glands. Scarring and mild cortical thinning involving both kidneys. Allowing for the unenhanced technique, no significant focal parenchymal abnormalities involving either kidney. No hydronephrosis. No urinary tract calculi. Normal appearing mildly distended urinary bladder. Stomach/Bowel: Stomach normal in appearance for the degree of distention. Normal-appearing small bowel. Mobile cecum positioned in the RIGHT UPPER QUADRANT. Large rectal colonic stool burden and moderate colonic stool burden elsewhere. High attenuation ingested material within the colon and in the normal appearing appendix located in the RIGHT mid abdomen. No focal colonic abnormality. Vascular/Lymphatic: Mild atherosclerosis involving the abdominal aorta without evidence of aneurysm. No pathologic lymphadenopathy. Reproductive: Normal-appearing uterus and ovaries without evidence of adnexal mass. Other: Mild edema in the abdominal wall overlying both flanks, right greater than left. Musculoskeletal: Degenerative disc disease and spondylosis at L5-S1, broad-based central disc protrusion and multifactorial spinal stenosis at L4-5 and diffuse facet degenerative changes throughout the lumbar spine. No acute findings. IMPRESSION: 1. Subtle low attenuation involving the lower pole of the spleen, not present on the prior CT, with associated edema in the perisplenic fat, possibly indicating splenic infarct or splenic abscess. CT abdomen with contrast may be confirmatory. If the patient's renal function does not allow intravenous contrast administration, then ultrasound may be helpful in further evaluation. 2. No acute abnormalities otherwise involving the abdomen or pelvis. 3. LEFT pleural effusion and associated mild passive atelectasis in the LEFT LOWER LOBE. 4. Small pericardial effusion versus  pericardial thickening. 5. Aortic Atherosclerosis (ICD10-I70.0). Electronically Signed   By: Evangeline Dakin M.D.   On: 03/11/2021 12:12        Scheduled Meds: . atorvastatin  20 mg Oral QHS  . buPROPion  150 mg Oral Daily  . escitalopram  20 mg Oral Daily  . famotidine  20 mg Oral Daily  . ferrous sulfate  325 mg Oral QHS  . levothyroxine  100 mcg Oral Q0600  . mouth rinse  15 mL Mouth Rinse BID  . melatonin  5 mg Oral QHS  . montelukast  10 mg Oral QHS  . multivitamin with minerals   Oral Daily  . oxybutynin  15 mg Oral QHS  . vitamin B-12  1,000 mcg Oral Daily   Continuous Infusions: . cefTRIAXone (ROCEPHIN)  IV 1 g (03/12/21 0902)  . heparin Stopped (03/12/21 1105)     LOS: 0 days    Time spent: 35 minutes    Barb Merino, MD [facility] Pager [phone]

## 2021-03-12 NOTE — Progress Notes (Signed)
Patient had a critical APTT. Spoke with the Pharmacist and will follow new heparin orders. NP also aware. Patient currently resting in bed with no complaints. Will continue to monitor.

## 2021-03-13 DIAGNOSIS — G9340 Encephalopathy, unspecified: Secondary | ICD-10-CM | POA: Diagnosis not present

## 2021-03-13 DIAGNOSIS — I6612 Occlusion and stenosis of left anterior cerebral artery: Secondary | ICD-10-CM | POA: Diagnosis not present

## 2021-03-13 DIAGNOSIS — N179 Acute kidney failure, unspecified: Secondary | ICD-10-CM | POA: Diagnosis not present

## 2021-03-13 LAB — PHOSPHORUS: Phosphorus: 3.5 mg/dL (ref 2.5–4.6)

## 2021-03-13 LAB — BASIC METABOLIC PANEL
Anion gap: 8 (ref 5–15)
BUN: 26 mg/dL — ABNORMAL HIGH (ref 8–23)
CO2: 24 mmol/L (ref 22–32)
Calcium: 9.7 mg/dL (ref 8.9–10.3)
Chloride: 104 mmol/L (ref 98–111)
Creatinine, Ser: 1.77 mg/dL — ABNORMAL HIGH (ref 0.44–1.00)
GFR, Estimated: 31 mL/min — ABNORMAL LOW (ref >60)
Glucose, Bld: 109 mg/dL — ABNORMAL HIGH (ref 70–99)
Potassium: 4 mmol/L (ref 3.5–5.1)
Sodium: 136 mmol/L (ref 135–145)

## 2021-03-13 LAB — CBC WITH DIFFERENTIAL/PLATELET
Abs Immature Granulocytes: 0.21 10*3/uL — ABNORMAL HIGH (ref 0.00–0.07)
Basophils Absolute: 0 10*3/uL (ref 0.0–0.1)
Basophils Relative: 0 %
Eosinophils Absolute: 0.1 10*3/uL (ref 0.0–0.5)
Eosinophils Relative: 1 %
HCT: 23.4 % — ABNORMAL LOW (ref 36.0–46.0)
Hemoglobin: 7.6 g/dL — ABNORMAL LOW (ref 12.0–15.0)
Immature Granulocytes: 2 %
Lymphocytes Relative: 13 %
Lymphs Abs: 1.2 10*3/uL (ref 0.7–4.0)
MCH: 30.9 pg (ref 26.0–34.0)
MCHC: 32.5 g/dL (ref 30.0–36.0)
MCV: 95.1 fL (ref 80.0–100.0)
Monocytes Absolute: 0.6 10*3/uL (ref 0.1–1.0)
Monocytes Relative: 7 %
Neutro Abs: 7.3 10*3/uL (ref 1.7–7.7)
Neutrophils Relative %: 77 %
Platelets: 78 10*3/uL — ABNORMAL LOW (ref 150–400)
RBC: 2.46 MIL/uL — ABNORMAL LOW (ref 3.87–5.11)
RDW: 13.1 % (ref 11.5–15.5)
WBC: 9.5 10*3/uL (ref 4.0–10.5)
nRBC: 0 % (ref 0.0–0.2)

## 2021-03-13 LAB — PROTIME-INR
INR: 1.4 — ABNORMAL HIGH (ref 0.8–1.2)
Prothrombin Time: 16.9 seconds — ABNORMAL HIGH (ref 11.4–15.2)

## 2021-03-13 LAB — PROCALCITONIN: Procalcitonin: 26.8 ng/mL

## 2021-03-13 LAB — APTT: aPTT: 133 seconds — ABNORMAL HIGH (ref 24–36)

## 2021-03-13 LAB — MAGNESIUM: Magnesium: 1.8 mg/dL (ref 1.7–2.4)

## 2021-03-13 LAB — HEPARIN LEVEL (UNFRACTIONATED): Heparin Unfractionated: 1.24 IU/mL — ABNORMAL HIGH (ref 0.30–0.70)

## 2021-03-13 MED ORDER — ENSURE ENLIVE PO LIQD
237.0000 mL | Freq: Three times a day (TID) | ORAL | Status: DC
  Administered 2021-03-13 – 2021-03-17 (×8): 237 mL via ORAL

## 2021-03-13 MED ORDER — POLYETHYLENE GLYCOL 3350 17 G PO PACK
17.0000 g | PACK | Freq: Every day | ORAL | Status: DC
  Administered 2021-03-13: 17 g via ORAL
  Filled 2021-03-13: qty 1

## 2021-03-13 MED ORDER — WARFARIN SODIUM 4 MG PO TABS
4.0000 mg | ORAL_TABLET | Freq: Once | ORAL | Status: AC
  Administered 2021-03-13: 4 mg via ORAL
  Filled 2021-03-13: qty 1

## 2021-03-13 MED ORDER — OXYCODONE HCL 5 MG PO TABS
2.5000 mg | ORAL_TABLET | Freq: Four times a day (QID) | ORAL | Status: DC | PRN
  Administered 2021-03-13 – 2021-03-15 (×6): 2.5 mg via ORAL
  Filled 2021-03-13 (×7): qty 1

## 2021-03-13 MED ORDER — HALOPERIDOL 2 MG PO TABS
2.0000 mg | ORAL_TABLET | Freq: Four times a day (QID) | ORAL | Status: DC | PRN
  Administered 2021-03-13 – 2021-03-14 (×3): 2 mg via ORAL
  Filled 2021-03-13 (×4): qty 1

## 2021-03-13 MED ORDER — HEPARIN (PORCINE) 25000 UT/250ML-% IV SOLN: 200.0000 [IU]/h | INTRAVENOUS | Status: DC

## 2021-03-13 MED ORDER — ENOXAPARIN SODIUM 100 MG/ML ~~LOC~~ SOLN
1.0000 mg/kg | Freq: Two times a day (BID) | SUBCUTANEOUS | Status: DC
  Administered 2021-03-13 – 2021-03-14 (×4): 82.5 mg via SUBCUTANEOUS
  Filled 2021-03-13 (×5): qty 1

## 2021-03-13 NOTE — Consult Note (Signed)
[facility] for Heparin + warfarin  Indication: chest pain/ACS, embolic stroke, and antiphospholipid antibody  Allergies  Allergen Reactions  . Penicillin G Rash    Patient Measurements: Height: 5\' 8"  (172.7 cm) Weight: 83.5 kg (184 lb) IBW/kg (Calculated) : 63.9 Heparin Dosing Weight: 81 kg  Vital Signs: Temp: 97.9 F (36.6 C) (04/11 0415) Temp Source: Oral (04/10 2013) BP: 149/86 (04/11 0415) Pulse Rate: 105 (04/11 0415)  Labs: Recent Labs    03/11/21 0926 03/11/21 1323 03/11/21 1507 03/11/21 2332 03/12/21 0553 03/12/21 1044 03/12/21 1852 03/13/21 0402  HGB 8.6*  --   --   --  7.6*  --   --  7.6*  HCT 26.4*  --   --   --  24.0*  --   --  23.4*  PLT 104*  --   --   --  80*  --   --  78*  APTT  --   --  150*   < >  --  >200* >160* 133*  LABPROT  --   --  18.4*  --   --   --   --  16.9*  INR  --   --  1.6*  --   --   --   --  1.4*  HEPARINUNFRC  --   --  3.38*  --   --  1.67*  --  1.24*  CREATININE 2.03*  --   --   --  1.80*  --   --  1.77*  TROPONINIHS  --  543* 564*  --   --   --   --   --    < > = values in this interval not displayed.    Estimated Creatinine Clearance: 34 mL/min (A) (by C-G formula based on SCr of 1.77 mg/dL (H)).   Medical History: Past Medical History:  Diagnosis Date  . Arthritis 04/02/1996  . Depression   . GERD (gastroesophageal reflux disease) 04/03/2019  . Hyperlipidemia   . Hypertension   . Renal insufficiency   . Stroke ([place])   . Thyroid disease     Medications:  Medications Prior to Admission  Medication Sig Dispense Refill Last Dose  . acetaminophen (TYLENOL) 325 MG tablet Take 2 tablets (650 mg total) by mouth every 4 (four) hours as needed for mild pain (or temp > 37.5 C (99.5 F)).   Unknown at PRN  . apixaban (ELIQUIS) 5 MG TABS tablet Take 1 tablet (5 mg total) by mouth 2 (two) times daily. 60 tablet  03/10/2021 at 1800  . ascorbic acid (VITAMIN C) 500 MG tablet Take 500 mg by mouth  2 (two) times daily.     Marland Kitchen atorvastatin (LIPITOR) 20 MG tablet Take 1 tablet (20 mg total) by mouth daily. (Patient taking differently: Take 20 mg by mouth every evening.)   03/10/2021 at 1800  . escitalopram (LEXAPRO) 20 MG tablet Take 1 tablet (20 mg total) by mouth daily. 90 tablet 0 03/10/2021 at 0900  . famotidine (PEPCID) 20 MG tablet Take 20 mg by mouth daily.     . ferrous sulfate 325 (65 FE) MG EC tablet Take 1 tablet (325 mg total) by mouth daily with breakfast. (Patient taking differently: Take 325 mg by mouth at bedtime.) 30 tablet 3   . levothyroxine (SYNTHROID) 100 MCG tablet Take 1 tablet (100 mcg total) by mouth daily at 6 (six) AM.   03/10/2021 at 0600  . loperamide (IMODIUM) 2 MG capsule  Take 1 capsule (2 mg total) by mouth every 6 (six) hours as needed for diarrhea or loose stools. 30 capsule 0 Unknown at PRN  . montelukast (SINGULAIR) 10 MG tablet TAKE 1 TABLET BY MOUTH EVERYDAY AT BEDTIME (Patient taking differently: Take 10 mg by mouth at bedtime. TAKE 1 TABLET BY MOUTH EVERYDAY AT BEDTIME) 90 tablet 3 03/10/2021 at 2000  . ondansetron (ZOFRAN-ODT) 4 MG disintegrating tablet Take 4 mg by mouth every 6 (six) hours as needed for nausea or vomiting.   Unknown at PRN  . oxybutynin (DITROPAN XL) 15 MG 24 hr tablet Take 1 tablet (15 mg total) by mouth at bedtime.   03/10/2021 at 2000  . sodium chloride (OCEAN) 0.65 % SOLN nasal spray Place 2 sprays into both nostrils every 6 (six) hours.     . traMADol (ULTRAM) 50 MG tablet Take 50 mg by mouth every 6 (six) hours.     . traZODone (DESYREL) 100 MG tablet Take 1 tablet (100 mg total) by mouth at bedtime.   03/10/2021 at 2000  . vitamin B-12 (CYANOCOBALAMIN) 1000 MCG tablet Take 1,000 mcg by mouth daily.     Marland Kitchen buPROPion (WELLBUTRIN XL) 150 MG 24 hr tablet Take 1 tablet (150 mg total) by mouth daily. (Patient not taking: No sig reported) 30 tablet 0 Not Taking at Unknown time  . diclofenac Sodium (VOLTAREN) 1 % GEL Apply 2 g topically 4 (four) times  daily as needed (left knee pain). (Patient not taking: No sig reported)   Not Taking at Unknown time  . neomycin-bacitracin-polymyxin (NEOSPORIN) OINT Apply 1 application topically 2 (two) times daily. (Patient not taking: No sig reported)   Not Taking at Unknown time   Scheduled:  . atorvastatin  20 mg Oral QHS  . buPROPion  150 mg Oral Daily  . escitalopram  20 mg Oral Daily  . famotidine  20 mg Oral Daily  . ferrous sulfate  325 mg Oral QHS  . levothyroxine  100 mcg Oral Q0600  . mouth rinse  15 mL Mouth Rinse BID  . melatonin  5 mg Oral QHS  . montelukast  10 mg Oral QHS  . multivitamin with minerals   Oral Daily  . oxybutynin  15 mg Oral QHS  . vitamin B-12  1,000 mcg Oral Daily  . Warfarin - Pharmacist Dosing Inpatient   Does not apply q1600   Infusions:  . cefTRIAXone (ROCEPHIN)  IV Stopped (03/12/21 4481)  . heparin     PRN: acetaminophen **OR** acetaminophen, fentaNYL (SUBLIMAZE) injection, ondansetron **OR** ondansetron (ZOFRAN) IV, oxyCODONE, traZODone Anti-infectives (From admission, onward)   Start     Dose/Rate Route Frequency Ordered Stop   03/12/21 1000  cefTRIAXone (ROCEPHIN) 1 g in sodium chloride 0.9 % 100 mL IVPB        1 g 200 mL/hr over 30 Minutes Intravenous Every 24 hours 03/11/21 2223     03/11/21 2315  azithromycin (ZITHROMAX) 500 mg in sodium chloride 0.9 % 250 mL IVPB  Status:  Discontinued        500 mg 250 mL/hr over 60 Minutes Intravenous Every 24 hours 03/11/21 2223 03/12/21 0842   03/11/21 1100  cefTRIAXone (ROCEPHIN) 1 g in sodium chloride 0.9 % 100 mL IVPB        1 g 200 mL/hr over 30 Minutes Intravenous  Once 03/11/21 1056 03/11/21 1131      Assessment: Pharmacy consulted for ACS. Trop elevated at 543.  She was on Coumadin prior to her  stroke, and while she was inpatient in [facility] acute rehab her consulting physician switched her over to Eliquis. Now, medical team is switching patient back to warfarin. Patient was on prescribed warfarin  prior to apixaban for antiphospholipid antibody syndrome. Prior notes state goal INR was 2.5 to 3.5. Dr. Ubaldo Glassing recommended a goal of 2 to 3. Will aim for an INR goal of 2.5 to 3, while inpatient. DDI: APAP (increase bleeding), escitalopram (increase bleeding), hypothroidism (decreases effect of warfarin). NO major DDIs.   Home regimen in January 2022 was warfarin 2mg : Tu,Th, Sa,Su Warfarin 3mg : M,W,F.   Date INR Warfarin Dose  4/9 1.6   4/10 -- 2 mg   Heparin monitoring 4/9 1507 aPTT 150 4/9 2332 aPTT >200, supratherapeutic 4/10 1044 aPTT > 200, HL 1.67.  4/10 1852 aPTT > 160 4/11 0402 aPTT 133, HL 1.24  Goal of Therapy:  Heparin level 0.3-0.7 units/ml once aPTT and heparin level correlate.  aPTT 66-102 seconds INR: 2.5 - 3.  Monitor platelets by anticoagulation protocol: Yes    Plan:  Heparin:  APTT reamins supratherapeutic. Will hold heparin infusion for 1 hour then restart at 200 units/hr. Recheck aPTT in 6 hours from infusion restart. HL & CBC daily while on heparin. Switch to heparin level monitoring once aPTT and heparin level correlate.  Warfarin:   INR is subtherapeutic. Will give warfarin 2 mg x 1 tonight. Daily INR ordered.   Renda Rolls, PharmD, [facility] 03/13/2021 5:36 AM

## 2021-03-13 NOTE — Progress Notes (Signed)
PROGRESS NOTE    [name]  JTT:[id number] DOB: 02-Mar-1951 DOA: 03/11/2021 PCP: Juline Patch, MD    Brief Narrative:  70 year old female with history of hypertension, hypothyroidism, history of DVT and antiphospholipid syndrome who was previously on Coumadin for 15 years and recently changed to Eliquis, chronic kidney disease stage IIIb with baseline creatinine 1.8, hyperlipidemia, depression, cognitive decline, recent history of stroke and rehab, recent right ankle fracture brought back from nursing home with altered mental status.  Recent hospitalization for stroke, rehab and discharged to the nursing home.  She has been remaining in poor health since then, appetite is poor.  Patient was complaining of midsternal chest pain for about 2 days.  Both spouse at long-term nursing home now. In the emergency room, patient is confused.  Hemodynamically stable.  Sodium 132.  Creatinine 2.03 mildly elevated from baseline otherwise most of the findings negative.  COVID-19 negative.  UA consistent with large leukocytes.  CT head with multiple previous infarctions but no acute findings. Patient was ultimately found to have cardioembolic stroke.   Assessment & Plan:   Active Problems:   Adult hypothyroidism   Essential hypertension   Anticoagulant long-term use   Antiphospholipid antibody syndrome (HCC)   Stage 3 chronic kidney disease (HCC)   Thrombocytopenia (HCC)   CVA (cerebral vascular accident) ([place])   Ischemic cerebrovascular accident (CVA) of frontal lobe (HCC)   Embolism of left anterior cerebral artery   AKI (acute kidney injury) ([place])   Pressure injury of skin   Encephalopathy  Acute metabolic encephalopathy in a patient with multiple medical issues: Developing vascular dementia and cognition deficits. Currently hemodynamically stable.  Neurologically stable.  Found to have acute a stroke, see below. Minimize benzodiazepines and opiates.  Patient has inconsistent complaints  and easily reoriented. Delirium precautions.  Sleep-wake cycle regulation.  Suspected UTI: Patient with recurrent UTI.  Urinalysis abnormal.  Previous Citrobacter.  Procalcitonin very high with no other explanation of infection.  Continue Rocephin pending final urine cultures or will treat with at least 3 days of therapy.  Non-STEMI:  With no evidence of ongoing chest pain.  EKG nonischemic.  Patient is currently on heparin.  Seen by cardiology.  Recommended conservative management.  2D echocardiogram today.  Currently not a candidate for cardiac catheterization.  Optimizing medical management by cardiology.  Acute kidney injury on chronic kidney disease stage IIIb: Patient was treated with IV fluids.  Renal functions improved.  Her baseline creatinine reported about 1.8.  Discontinue further IV fluids.  Antiphospholipid antibody syndrome/hypercoagulable condition/recurrent cardioembolic stroke: Repeat MRI showed multiple cardioembolic showering embolism.  No new neurological deficit. Diagnosed antiphospholipid antibody syndrome, changed to Eliquis 3 months ago.  Will treat as failure of Eliquis as she was consistently taking this at nursing home. Patient on heparin, changed to Lovenox today.  Coumadin started 4/10, long-term management should be with Coumadin.  Patient is going to be in a supervised living condition now, will be able to keep up on Coumadin. Sinus rhythm and rate controlled. Neurology following, PT/OT/speech.  Referred to a skilled nursing rehab.  Right ankle fracture: Conservative management.  Nonweightbearing on cast.  Hypothyroidism: On Synthroid that we will continue.  TSH normal.  Anemia of chronic disease: Hemoglobin trending down.  Iron and ferritin level were normal.  B12 level is adequate and on replacement.   Anemia of chronic disease. Patient will need therapeutic anticoagulation at this time, she may benefit with a screening exam including EGD and colonoscopy,  however with  acute embolic stroke may postpone the procedure. I called and discussed case with hematology for evaluation and schedule follow-up.  DVT prophylaxis: Place TED hose Start: 03/11/21 1309, Lovenox and Coumadin.   Code Status: DNR Family Communication: Patient's daughter at the bedside Disposition Plan: Status is: Inpatient  Remains inpatient appropriate because:Altered mental status, IV treatments appropriate due to intensity of illness or inability to take PO and Inpatient level of care appropriate due to severity of illness   Dispo: The patient is from: SNF              Anticipated d/c is to: SNF              Patient currently is not medically stable to d/c.   Difficult to place patient No  Consultants:   Cardiology  Neurology  Hematology  Procedures:   None  Antimicrobials:   Rocephin 4/9---   Subjective: Patient seen and examined.  She was alert awake and able to follow commands.  She had episodes in between where she will complain of pain, then when you distract her the pain will be gone.  Patient does have inconsistent symptoms and complaints.  She is eating regular diet now.  When I examined her, she was able to use all her extremities without deficits. Daughter at the bedside.  Objective: Vitals:   03/12/21 1640 03/12/21 2013 03/13/21 0415 03/13/21 0823  BP: (!) 155/96 (!) 160/97 (!) 149/86 (!) 158/86  Pulse: 84 (!) 108 (!) 105 (!) 105  Resp: 20 18  18   Temp: 98.4 F (36.9 C) 98.4 F (36.9 C) 97.9 F (36.6 C) 98 F (36.7 C)  TempSrc: Oral Oral    SpO2: 100% 97% 100% 100%  Weight:      Height:        Intake/Output Summary (Last 24 hours) at 03/13/2021 1044 Last data filed at 03/13/2021 0820 Gross per 24 hour  Intake 131.92 ml  Output 2036 ml  Net -1904.08 ml   Filed Weights   03/11/21 0921  Weight: 83.5 kg    Examination:  General exam: Appears calm and comfortable , frail and debilitated.  Chronically sick looking.  Not in any  distress.  She is alert awake and oriented x2-3.  Her mental status sometimes fluctuates.  Gets easily irritable. Respiratory system: Clear to auscultation. Respiratory effort normal. Cardiovascular system: S1 & S2 heard, RRR. No JVD, murmurs, rubs, gallops or clicks. No pedal edema. Gastrointestinal system: Abdomen is nondistended, soft and nontender. No organomegaly or masses felt. Normal bowel sounds heard. 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  Right eye vision is intact.  She has equal strength on all extremities, she has cast on her right lower leg that makes it difficult to mobility.  Has generalized weakness.    Data Reviewed: I have personally reviewed following labs and imaging studies  CBC: Recent Labs  Lab 03/11/21 0926 03/12/21 0553 03/13/21 0402  WBC 8.5 7.3 9.5  NEUTROABS 6.3  --  7.3  HGB 8.6* 7.6* 7.6*  HCT 26.4* 24.0* 23.4*  MCV 94.6 96.8 95.1  PLT 104* 80* 78*   Basic Metabolic Panel: Recent Labs  Lab 03/11/21 0926 03/12/21 0553 03/13/21 0402  NA 132* 137 136  K 3.9 4.1 4.0  CL 101 103 104  CO2 25 24 24   GLUCOSE 112* 88 109*  BUN 35* 31* 26*  CREATININE 2.03* 1.80* 1.77*  CALCIUM 10.0 9.5 9.7  MG  --   --  1.8  PHOS  --   --  3.5   GFR: Estimated Creatinine Clearance: 34 mL/min (A) (by C-G formula based on SCr of 1.77 mg/dL (H)). Liver Function Tests: Recent Labs  Lab 03/11/21 0926  AST 46*  ALT 44  ALKPHOS 262*  BILITOT 0.9  PROT 7.2  ALBUMIN 2.7*   No results for input(s): LIPASE, AMYLASE in the last 168 hours. Recent Labs  Lab 03/11/21 1323  AMMONIA <9*   Coagulation Profile: Recent Labs  Lab 03/11/21 1507 03/13/21 0402  INR 1.6* 1.4*   Cardiac Enzymes: No results for input(s): CKTOTAL, CKMB, CKMBINDEX, TROPONINI in the last 168 hours. BNP (last 3 results) No results for input(s): PROBNP in the last 8760 hours. HbA1C: No results for input(s): HGBA1C in the last 72 hours. CBG: Recent Labs  Lab 03/12/21 1347   GLUCAP 89   Lipid Profile: No results for input(s): CHOL, HDL, LDLCALC, TRIG, CHOLHDL, LDLDIRECT in the last 72 hours. Thyroid Function Tests: Recent Labs    03/12/21 1216  TSH 4.896*   Anemia Panel: Recent Labs    03/12/21 1216  VITAMINB12 4,012*  FERRITIN 744*  TIBC 245*  IRON 39   Sepsis Labs: Recent Labs  Lab 03/11/21 1323 03/12/21 0553 03/13/21 0402  PROCALCITON 59.38 46.36 26.80    Recent Results (from the past 240 hour(s))  Urine Culture     Status: Abnormal   Collection Time: 03/11/21  9:26 AM   Specimen: Urine, Random  Result Value Ref Range Status   Specimen Description   Final    URINE, RANDOM Performed at [facility], [address], [place], [place] [address]    Special Requests   Final    NONE Performed at [facility], [address] [place], [place] [address]    Culture (A)  Final    20,000 COLONIES/mL DIPHTHEROIDS(CORYNEBACTERIUM SPECIES) Standardized susceptibility testing for this organism is not available. Performed at [facility], [place] [address], [place], [place] [address]    Report Status 03/12/2021 FINAL  Final  SARS CORONAVIRUS 2 (TAT 6-24 HRS) Nasopharyngeal Nasopharyngeal Swab     Status: None   Collection Time: 03/11/21 11:05 AM   Specimen: Nasopharyngeal Swab  Result Value Ref Range Status   SARS Coronavirus 2 NEGATIVE NEGATIVE Final    Comment: (NOTE) SARS-CoV-2 target nucleic acids are NOT DETECTED.  The SARS-CoV-2 RNA is generally detectable in upper and lower respiratory specimens during the acute phase of infection. Negative results do not preclude SARS-CoV-2 infection, do not rule out co-infections with other pathogens, and should not be used as the sole basis for treatment or other patient management decisions. Negative results must be combined with clinical observations, patient history, and epidemiological information. The expected result is Negative.  Fact Sheet for  Patients: SugarRoll.be  Fact Sheet for Healthcare Providers: https://www.woods-mathews.com/  This test is not yet approved or cleared by the [place] FDA and  has been authorized for detection and/or diagnosis of SARS-CoV-2 by FDA under an Emergency Use Authorization (EUA). This EUA will remain  in effect (meaning this test can be used) for the duration of the COVID-19 declaration under Se ction 564(b)(1) of the Act, 21 U.S.C. section 360bbb-3(b)(1), unless the authorization is terminated or revoked sooner.  Performed at [facility], [place] [address], [place], [place] [address]          Radiology Studies: MR BRAIN WO CONTRAST  Result Date: 03/12/2021 CLINICAL DATA:  Confusion. Recent history of stroke. Hypercoagulable.  EXAM: MRI HEAD WITHOUT CONTRAST TECHNIQUE: Multiplanar, multiecho pulse sequences of the brain and surrounding structures were obtained without intravenous contrast. COMPARISON:  12/26/2020 FINDINGS: Brain: Innumerable subcentimeter mainly cortically based acute infarcts along the bilateral cerebral convexity with predilection for the watershed regions. Similar size infarcts are seen in the bilateral cerebellum. Numerous pre-existing cerebellar and cortical infarcts with generalized brain atrophy. Large remote right occipital infarct with dense encephalomalacia. No acute hemorrhage, hydrocephalus, or mass. Vascular: Major flow voids are preserved Skull and upper cervical spine: Normal marrow signal Sinuses/Orbits: Negative IMPRESSION: 1. Shower of embolic pattern acute infarcts involving the infra and supratentorial brain. 2. Extensive chronic ischemic injury. Electronically Signed   By: Monte Fantasia M.D.   On: 03/12/2021 11:52   CT Renal Stone Study  Result Date: 03/11/2021 CLINICAL DATA:  70 year old with chronic kidney disease, presenting with a urinary tract infection. EXAM: CT ABDOMEN AND PELVIS WITHOUT CONTRAST  TECHNIQUE: Multidetector CT imaging of the abdomen and pelvis was performed following the standard protocol without IV contrast. COMPARISON:  01/09/2021. FINDINGS: Lower chest: LEFT pleural effusion and associated mild passive atelectasis in th, broad-based central disc protrusion and multifactorial spinal stenosis at L4-5, and diffuse degenerative changes e LEFT LOWER LOBE. Visualized lung bases otherwise clear. Heart moderately enlarged. Small pericardial effusion versus pericardial thickening. Hepatobiliary: Normal unenhanced appearance of the liver. Surgically absent gallbladder. No unexpected biliary ductal dilation. Pancreas: Normal unenhanced appearance. Spleen: Subtle low attenuation involving the lower pole of the spleen, not present on the prior CT, with associated edema in the perisplenic fat. Adrenals/Urinary Tract: Normal appearing adrenal glands. Scarring and mild cortical thinning involving both kidneys. Allowing for the unenhanced technique, no significant focal parenchymal abnormalities involving either kidney. No hydronephrosis. No urinary tract calculi. Normal appearing mildly distended urinary bladder. Stomach/Bowel: Stomach normal in appearance for the degree of distention. Normal-appearing small bowel. Mobile cecum positioned in the RIGHT UPPER QUADRANT. Large rectal colonic stool burden and moderate colonic stool burden elsewhere. High attenuation ingested material within the colon and in the normal appearing appendix located in the RIGHT mid abdomen. No focal colonic abnormality. Vascular/Lymphatic: Mild atherosclerosis involving the abdominal aorta without evidence of aneurysm. No pathologic lymphadenopathy. Reproductive: Normal-appearing uterus and ovaries without evidence of adnexal mass. Other: Mild edema in the abdominal wall overlying both flanks, right greater than left. Musculoskeletal: Degenerative disc disease and spondylosis at L5-S1, broad-based central disc protrusion and  multifactorial spinal stenosis at L4-5 and diffuse facet degenerative changes throughout the lumbar spine. No acute findings. IMPRESSION: 1. Subtle low attenuation involving the lower pole of the spleen, not present on the prior CT, with associated edema in the perisplenic fat, possibly indicating splenic infarct or splenic abscess. CT abdomen with contrast may be confirmatory. If the patient's renal function does not allow intravenous contrast administration, then ultrasound may be helpful in further evaluation. 2. No acute abnormalities otherwise involving the abdomen or pelvis. 3. LEFT pleural effusion and associated mild passive atelectasis in the LEFT LOWER LOBE. 4. Small pericardial effusion versus pericardial thickening. 5. Aortic Atherosclerosis (ICD10-I70.0). Electronically Signed   By: Evangeline Dakin M.D.   On: 03/11/2021 12:12        Scheduled Meds: . atorvastatin  20 mg Oral QHS  . buPROPion  150 mg Oral Daily  . enoxaparin (LOVENOX) injection  1 mg/kg Subcutaneous Q12H  . escitalopram  20 mg Oral Daily  . famotidine  20 mg Oral Daily  . ferrous sulfate  325 mg Oral QHS  . levothyroxine  100 mcg Oral Q0600  . mouth rinse  15 mL Mouth Rinse BID  . melatonin  5 mg Oral QHS  . montelukast  10 mg Oral QHS  . multivitamin with minerals   Oral Daily  . oxybutynin  15 mg Oral QHS  . vitamin B-12  1,000 mcg Oral Daily  . Warfarin - Pharmacist Dosing Inpatient   Does not apply q1600   Continuous Infusions: . cefTRIAXone (ROCEPHIN)  IV Stopped (03/12/21 8638)     LOS: 1 day    Time spent: 32 minutes    Barb Merino, MD [facility] Pager [phone]

## 2021-03-13 NOTE — Evaluation (Signed)
Clinical/Bedside Swallow Evaluation Patient Details  Name: [name] MRN: [id number] Date of Birth: 1951/02/07  Today's Date: 03/13/2021 Time: SLP Start Time (ACUTE ONLY): 1310 SLP Stop Time (ACUTE ONLY): 1400 SLP Time Calculation (min) (ACUTE ONLY): 50 min  Past Medical History:  Past Medical History:  Diagnosis Date  . Arthritis 04/02/1996  . Depression   . GERD (gastroesophageal reflux disease) 04/03/2019  . Hyperlipidemia   . Hypertension   . Renal insufficiency   . Stroke ([place])   . Thyroid disease    Past Surgical History:  Past Surgical History:  Procedure Laterality Date  . CESAREAN SECTION    . CHOLECYSTECTOMY    . COLONOSCOPY  2012   repeat in 10 yrs  . KNEE ARTHROSCOPY W/ MENISCAL REPAIR Left    HPI:  Per admitting H&P " [name] is a 70 y.o. female with medical history significant for hypertension, history of right trimalleoli are fracture, hypothyroid, history of DVT and antiphospholipid antibody syndrome previously on Coumadin, CKD 3B, hyperlipidemia, depression, mild cognitive decline, history of CVA, right PCA infarct, presents emergency department for chief concerns of confusion.     She presents from [facility], for chief concern of intermittent confusion, for three weeks and worse int he last 2-3 days. "   Assessment / Plan / Recommendation Clinical Impression  Pt presents with functional swallowing abilities at bedside with no overt s/s of aspiration. Pt was confused, distrated easily and needed cues to participate with eval. Family was present and supportive. Oral mech exam revealed strcutures to be functioning adequately. Oral transit delay with solids, mild oral residue after the swallow. Vocal quality remained clear, laryngeal elevation appeared adequate. Rec dys 3 diet with chopped meats. Encourage PO intake at all meals. Pt may need periods of rest and snacks throughout the day. Pt initially refused to eat lunch with ST but eventually  agreed to 10 bites with a break before eating 10 more with family. SLP Visit Diagnosis: Dysphagia, oropharyngeal phase (R13.12)    Aspiration Risk  Mild aspiration risk    Diet Recommendation Dysphagia 3 (Mech soft);Other (Comment) (Chopped meats)   Liquid Administration via: Cup;Straw Medication Administration: Whole meds with liquid Supervision: Full supervision/cueing for compensatory strategies;Staff to assist with self feeding Compensations: Minimize environmental distractions;Slow rate;Small sips/bites Postural Changes: Seated upright at 90 degrees;Remain upright for at least 30 minutes after po intake    Other  Recommendations  PT/ OT    Follow up Recommendations   F/u with tol of diet     Frequency and Duration   F/u x1         Prognosis  Good      Swallow Study   General Date of Onset: 03/11/21 HPI: Per admitting H&P " [name] [name] is a 70 y.o. female with medical history significant for hypertension, history of right trimalleoli are fracture, hypothyroid, history of DVT and antiphospholipid antibody syndrome previously on Coumadin, CKD 3B, hyperlipidemia, depression, mild cognitive decline, history of CVA, right PCA infarct, presents emergency department for chief concerns of confusion.     She presents from [facility], for chief concern of intermittent confusion, for three weeks and worse int he last 2-3 days. " Type of Study: Bedside Swallow Evaluation Diet Prior to this Study: Dysphagia 3 (soft);Other (Comment) (Chopped meats) Temperature Spikes Noted: No Respiratory Status: Room air History of Recent Intubation: No Behavior/Cognition: Alert;Distractible;Requires cueing;Pleasant mood Oral Cavity Assessment: Within Functional Limits Oral Care Completed by SLP: No Oral Cavity -  Dentition: Adequate natural dentition Vision: Impaired for self-feeding Self-Feeding Abilities: Total assist;Needs assist Patient Positioning: Upright in bed Baseline Vocal  Quality: Normal    Oral/Motor/Sensory Function Overall Oral Motor/Sensory Function: Within functional limits   Ice Chips Ice chips: Within functional limits Presentation: Spoon   Thin Liquid Thin Liquid: Within functional limits Presentation: Cup;Spoon;Straw    Nectar Thick Nectar Thick Liquid: Not tested   Honey Thick Honey Thick Liquid: Not tested   Puree Puree: Within functional limits Presentation: Spoon   Solid     Solid: Within functional limits Presentation: Dell Ponto 03/13/2021,2:11 PM

## 2021-03-13 NOTE — Progress Notes (Signed)
Initial Nutrition Assessment  DOCUMENTATION CODES:   Not applicable  INTERVENTION:   -MVI with minerals daily -Ensure Enlive po TID, each supplement provides 350 kcal and 20 grams of protein -Magic cup TID with meals, each supplement provides 290 kcal and 9 grams of protein  NUTRITION DIAGNOSIS:   Inadequate oral intake related to lethargy/confusion,poor appetite as evidenced by meal completion < 50%,per patient/family report.  GOAL:   Patient will meet greater than or equal to 90% of their needs  MONITOR:   PO intake,Supplement acceptance,Diet advancement,Labs,Weight trends,Skin,I & O's  REASON FOR ASSESSMENT:   Malnutrition Screening Tool    ASSESSMENT:   70 year old female with history of hypertension, hypothyroidism, history of DVT and antiphospholipid syndrome who was previously on Coumadin for 15 years and recently changed to Eliquis, chronic kidney disease stage IIIb with baseline creatinine 1.8, hyperlipidemia, depression, cognitive decline, recent history of stroke and rehab, recent right ankle fracture brought back from nursing home with altered mental status.  Recent hospitalization for stroke, rehab and discharged to the nursing home.  She has been remaining in poor health since then, appetite is poor.  Patient was complaining of midsternal chest pain for about 2 days.  Both spouse at long-term nursing home now.  Pt admitted with acute metabolic encephalopathy.   Reviewed I/O's: -1.6 L x 24 hours and -2 L since admission  Spoke with pt and family members at bedside. Per family, pt has had a decreased appetite over the past 2 weeks, which is very unusual for her. They report that pt has a very good appetite at baseline, consuming 3 meals per day. However, intake has been consisted of a half a sandwich or half a graham cracker at each meal.   Pt with improvement in mental status today and is accepting about 10 bites of a meal tray at a time well (pt family frequently  offering her food). Pt also drinks very well and really like Ensure Enlive supplements (vanilla flavor).   Per family, pt has lost about 10-15 pounds over the past 2 weeks secondary to poor oral intake, however, this is not confirmed by wt hx. They are extremely concerned about her nutritional status and were thinking about placing a feeding tube PTA. RD discussed that pt's intake will be closely monitored; family hopeful for improvements.   Discussed importance of good meal and supplement intake to promote healing. They are amenable to Ensure Enlive supplements and Magic Cups.   Medications reviewed and include ferrous sulfate, lovenox, melatonin, and vitamin B-12.   Labs reviewed: CBGS: 89.  NUTRITION - FOCUSED PHYSICAL EXAM:  Flowsheet Row Most Recent Value  Orbital Region No depletion  Upper Arm Region Mild depletion  Thoracic and Lumbar Region No depletion  Buccal Region No depletion  Temple Region No depletion  Clavicle Bone Region No depletion  Clavicle and Acromion Bone Region No depletion  Scapular Bone Region No depletion  Dorsal Hand No depletion  Patellar Region Mild depletion  Anterior Thigh Region Mild depletion  Posterior Calf Region Mild depletion  Edema (RD Assessment) None  Hair Reviewed  Eyes Reviewed  Mouth Reviewed  Skin Reviewed  Nails Reviewed       Diet Order:   Diet Order            Diet regular Room service appropriate? Yes; Fluid consistency: Thin  Diet effective now                 EDUCATION NEEDS:   Education needs have  been addressed  Skin:  Skin Assessment: Skin Integrity Issues: Skin Integrity Issues:: Stage II Stage II: lt buttocks  Last BM:  Unknown  Height:   Ht Readings from Last 1 Encounters:  03/11/21 5\' 8"  (1.727 m)    Weight:   Wt Readings from Last 1 Encounters:  03/11/21 83.5 kg    Ideal Body Weight:  63.6 kg  BMI:  Body mass index is 27.98 kg/m.  Estimated Nutritional Needs:   Kcal:   1900-2100  Protein:  95-110 grams  Fluid:  > 1.9 L    Loistine Chance, RD, LDN, [place] Registered Dietitian II Certified Diabetes Care and Education Specialist Please refer to [facility] for RD and/or RD on-call/weekend/after hours pager

## 2021-03-13 NOTE — Evaluation (Signed)
Physical Therapy Evaluation Patient Details Name: [name] MRN: [id number] DOB: [birth year] Today's Date: 03/13/2021   History of Present Illness  70 y.o. female who presents emergency department for chief concerns of confusion. MRI reveals embolic pattern acute infarcts involving the infra and supratentorial brain and extensive chronic ischemic injury. PMH: hypertension, history of right trimalleolar ankle fracture, hypothyroid, history of DVT, hyperlipidemia, depression, mild cognitive decline, history of CVA, right PCA infarct.  Clinical Impression  Pt is a pleasant 70 year old female who was admitted for AKI. Pt performs bed mobility with mod assist +2 to come to seated position. Unable to further perform OOB mobility. Pt demonstrates deficits with strength/endurance/cognition. Currently at R LE NWB at this time due to trimalleolar fracture. Of note, blindness in L eye.  Would benefit from skilled PT to address above deficits and promote optimal return to PLOF; recommend transition to STR upon discharge from acute hospitalization.     Follow Up Recommendations SNF    Equipment Recommendations  None recommended by PT    Recommendations for Other Services       Precautions / Restrictions Precautions Precautions: Fall Restrictions Weight Bearing Restrictions: Yes RLE Weight Bearing: Non weight bearing      Mobility  Bed Mobility Overal bed mobility: Needs Assistance Bed Mobility: Supine to Sit Rolling: Supervision   Supine to sit: Mod assist;+2 for physical assistance     General bed mobility comments: able to come to sit with +2 assist. Once seated, needs constant min assist to maintain balance while taking pills. Fatigues quickly, unable to further progress mobility efforts.    Transfers                 General transfer comment: unable to attempt at this time  Ambulation/Gait                Stairs            Wheelchair Mobility     Modified Rankin (Stroke Patients Only)       Balance Overall balance assessment: Needs assistance Sitting-balance support: Feet supported Sitting balance-Leahy Scale: Fair                                       Pertinent Vitals/Pain Pain Assessment: Faces Faces Pain Scale: Hurts even more Pain Location: L Hip Pain Descriptors / Indicators: Discomfort;Guarding Pain Intervention(s): Limited activity within patient's tolerance;Repositioned    Home Living Family/patient expects to be discharged to:: Skilled nursing facility Living Arrangements: Spouse/significant other               Additional Comments: Pt has been in rehab since Jan 2022. She was ambulating with therapy in parallel bars. Experienced fall in bathroom and is now R LE NWB s/p trimalleloar fx-non surgical. Prior to Jan 2022, pt was living with her husband in a multi-level house w/ 5 steps to enter    Prior Function Level of Independence: Needs assistance   Gait / Transfers Assistance Needed: Prior to Jan 2022, pt was independent w/out AD/DME for functional mobility.  ADL's / Homemaking Assistance Needed: Prior to Jan 2022, pt was independent with ADLs/IADLs and was a caregiver for her husband. Per daughter, pt was working on completing seated ADLs while in rehab  Comments: Caregiver of her husband. Hasn't performed OOB mobility in 3 weeks s/p fall and R ankle fracture     Hand Dominance  Extremity/Trunk Assessment   Upper Extremity Assessment Upper Extremity Assessment: Generalized weakness (grossly 3+/5; equal grip; R UE tremors)    Lower Extremity Assessment Lower Extremity Assessment: Generalized weakness (B LE grossly 2/5; reports pain with all movement)       Communication   Communication: No difficulties  Cognition Arousal/Alertness: Awake/alert Behavior During Therapy: Restless Overall Cognitive Status: Impaired/Different from baseline                                  General Comments: confused, however pleasant. A&O x 1      General Comments      Exercises Other Exercises Other Exercises: supine ther-ex on B LE needing multiple cues for attention to task. Ther-ex including SLRs, hip abd/add, and heel slides. min/mod assist with B LE. 10 reps   Assessment/Plan    PT Assessment Patient needs continued PT services  PT Problem List Decreased strength;Decreased activity tolerance;Decreased balance;Decreased mobility;Decreased cognition;Decreased knowledge of precautions       PT Treatment Interventions Gait training;DME instruction;Therapeutic exercise;Balance training    PT Goals (Current goals can be found in the Care Plan section)  Acute Rehab PT Goals Patient Stated Goal: to go to rehab PT Goal Formulation: With patient Time For Goal Achievement: 03/27/21 Potential to Achieve Goals: Fair    Frequency Min 2X/week   Barriers to discharge        Co-evaluation               AM-PAC PT "6 Clicks" Mobility  Outcome Measure Help needed turning from your back to your side while in a flat bed without using bedrails?: A Lot Help needed moving from lying on your back to sitting on the side of a flat bed without using bedrails?: A Lot Help needed moving to and from a bed to a chair (including a wheelchair)?: Total Help needed standing up from a chair using your arms (e.g., wheelchair or bedside chair)?: Total Help needed to walk in hospital room?: Total Help needed climbing 3-5 steps with a railing? : Total 6 Click Score: 8    End of Session   Activity Tolerance: Patient tolerated treatment well (limited by cognition) Patient left: in bed;with bed alarm set Nurse Communication: Mobility status PT Visit Diagnosis: Muscle weakness (generalized) (M62.81);Difficulty in walking, not elsewhere classified (R26.2)    Time: 7416-3845 PT Time Calculation (min) (ACUTE ONLY): 21 min   Charges:   PT Evaluation $PT Eval Low  Complexity: 1 Low PT Treatments $Therapeutic Exercise: 8-22 mins        Greggory Stallion, PT, DPT [phone]   [name] 03/13/2021, 4:00 PM

## 2021-03-13 NOTE — TOC Progression Note (Signed)
Transition of Care [facility]) - Progression Note    Patient Details  Name: [name] MRN: [id number] Date of Birth: July 19, 1951  Transition of Care [facility]) CM/SW [place], LCSW Phone Number: 03/13/2021, 1:57 PM  Clinical Narrative:   [facility] is unable to offer a bed. [facility] has received the referral but has not reviewed it yet.  Expected Discharge Plan: [place] Barriers to Discharge: Continued Medical Work up  Expected Discharge Plan and Services Expected Discharge Plan: [place] arrangements for the past 2 months: [place]                                       Social Determinants of Health (SDOH) Interventions    Readmission Risk Interventions No flowsheet data found.

## 2021-03-13 NOTE — Consult Note (Signed)
[facility] for lovenox + warfarin  Indication: chest pain/ACS, embolic stroke, and antiphospholipid antibody  Allergies  Allergen Reactions  . Penicillin G Rash    Patient Measurements: Height: 5\' 8"  (172.7 cm) Weight: 83.5 kg (184 lb) IBW/kg (Calculated) : 63.9 Heparin Dosing Weight: 81 kg  Vital Signs: Temp: 98.4 F (36.9 C) (04/11 1129) BP: 128/82 (04/11 1129) Pulse Rate: 93 (04/11 1129)  Labs: Recent Labs    03/11/21 0926 03/11/21 1323 03/11/21 1507 03/11/21 2332 03/12/21 0553 03/12/21 1044 03/12/21 1852 03/13/21 0402  HGB 8.6*  --   --   --  7.6*  --   --  7.6*  HCT 26.4*  --   --   --  24.0*  --   --  23.4*  PLT 104*  --   --   --  80*  --   --  78*  APTT  --   --  150*   < >  --  >200* >160* 133*  LABPROT  --   --  18.4*  --   --   --   --  16.9*  INR  --   --  1.6*  --   --   --   --  1.4*  HEPARINUNFRC  --   --  3.38*  --   --  1.67*  --  1.24*  CREATININE 2.03*  --   --   --  1.80*  --   --  1.77*  TROPONINIHS  --  543* 564*  --   --   --   --   --    < > = values in this interval not displayed.    Estimated Creatinine Clearance: 34 mL/min (A) (by C-G formula based on SCr of 1.77 mg/dL (H)).   Medical History: Past Medical History:  Diagnosis Date  . Arthritis 04/02/1996  . Depression   . GERD (gastroesophageal reflux disease) 04/03/2019  . Hyperlipidemia   . Hypertension   . Renal insufficiency   . Stroke ([place])   . Thyroid disease     Medications:  Medications Prior to Admission  Medication Sig Dispense Refill Last Dose  . acetaminophen (TYLENOL) 325 MG tablet Take 2 tablets (650 mg total) by mouth every 4 (four) hours as needed for mild pain (or temp > 37.5 C (99.5 F)).   Unknown at PRN  . apixaban (ELIQUIS) 5 MG TABS tablet Take 1 tablet (5 mg total) by mouth 2 (two) times daily. 60 tablet  03/10/2021 at 1800  . ascorbic acid (VITAMIN C) 500 MG tablet Take 500 mg by mouth 2 (two) times daily.     Marland Kitchen  atorvastatin (LIPITOR) 20 MG tablet Take 1 tablet (20 mg total) by mouth daily. (Patient taking differently: Take 20 mg by mouth every evening.)   03/10/2021 at 1800  . escitalopram (LEXAPRO) 20 MG tablet Take 1 tablet (20 mg total) by mouth daily. 90 tablet 0 03/10/2021 at 0900  . famotidine (PEPCID) 20 MG tablet Take 20 mg by mouth daily.     . ferrous sulfate 325 (65 FE) MG EC tablet Take 1 tablet (325 mg total) by mouth daily with breakfast. (Patient taking differently: Take 325 mg by mouth at bedtime.) 30 tablet 3   . levothyroxine (SYNTHROID) 100 MCG tablet Take 1 tablet (100 mcg total) by mouth daily at 6 (six) AM.   03/10/2021 at 0600  . loperamide (IMODIUM) 2 MG capsule Take 1 capsule (2 mg  total) by mouth every 6 (six) hours as needed for diarrhea or loose stools. 30 capsule 0 Unknown at PRN  . montelukast (SINGULAIR) 10 MG tablet TAKE 1 TABLET BY MOUTH EVERYDAY AT BEDTIME (Patient taking differently: Take 10 mg by mouth at bedtime. TAKE 1 TABLET BY MOUTH EVERYDAY AT BEDTIME) 90 tablet 3 03/10/2021 at 2000  . ondansetron (ZOFRAN-ODT) 4 MG disintegrating tablet Take 4 mg by mouth every 6 (six) hours as needed for nausea or vomiting.   Unknown at PRN  . oxybutynin (DITROPAN XL) 15 MG 24 hr tablet Take 1 tablet (15 mg total) by mouth at bedtime.   03/10/2021 at 2000  . sodium chloride (OCEAN) 0.65 % SOLN nasal spray Place 2 sprays into both nostrils every 6 (six) hours.     . traMADol (ULTRAM) 50 MG tablet Take 50 mg by mouth every 6 (six) hours.     . traZODone (DESYREL) 100 MG tablet Take 1 tablet (100 mg total) by mouth at bedtime.   03/10/2021 at 2000  . vitamin B-12 (CYANOCOBALAMIN) 1000 MCG tablet Take 1,000 mcg by mouth daily.     Marland Kitchen buPROPion (WELLBUTRIN XL) 150 MG 24 hr tablet Take 1 tablet (150 mg total) by mouth daily. (Patient not taking: No sig reported) 30 tablet 0 Not Taking at Unknown time  . diclofenac Sodium (VOLTAREN) 1 % GEL Apply 2 g topically 4 (four) times daily as needed (left knee  pain). (Patient not taking: No sig reported)   Not Taking at Unknown time  . neomycin-bacitracin-polymyxin (NEOSPORIN) OINT Apply 1 application topically 2 (two) times daily. (Patient not taking: No sig reported)   Not Taking at Unknown time   Scheduled:  . atorvastatin  20 mg Oral QHS  . buPROPion  150 mg Oral Daily  . enoxaparin (LOVENOX) injection  1 mg/kg Subcutaneous Q12H  . escitalopram  20 mg Oral Daily  . famotidine  20 mg Oral Daily  . ferrous sulfate  325 mg Oral QHS  . levothyroxine  100 mcg Oral Q0600  . mouth rinse  15 mL Mouth Rinse BID  . melatonin  5 mg Oral QHS  . montelukast  10 mg Oral QHS  . multivitamin with minerals   Oral Daily  . oxybutynin  15 mg Oral QHS  . vitamin B-12  1,000 mcg Oral Daily  . Warfarin - Pharmacist Dosing Inpatient   Does not apply q1600   Infusions:  . cefTRIAXone (ROCEPHIN)  IV 1 g (03/13/21 1056)   PRN: acetaminophen **OR** acetaminophen, ondansetron **OR** ondansetron (ZOFRAN) IV, oxyCODONE, traZODone Anti-infectives (From admission, onward)   Start     Dose/Rate Route Frequency Ordered Stop   03/12/21 1000  cefTRIAXone (ROCEPHIN) 1 g in sodium chloride 0.9 % 100 mL IVPB        1 g 200 mL/hr over 30 Minutes Intravenous Every 24 hours 03/11/21 2223     03/11/21 2315  azithromycin (ZITHROMAX) 500 mg in sodium chloride 0.9 % 250 mL IVPB  Status:  Discontinued        500 mg 250 mL/hr over 60 Minutes Intravenous Every 24 hours 03/11/21 2223 03/12/21 0842   03/11/21 1100  cefTRIAXone (ROCEPHIN) 1 g in sodium chloride 0.9 % 100 mL IVPB        1 g 200 mL/hr over 30 Minutes Intravenous  Once 03/11/21 1056 03/11/21 1131      Assessment: Pharmacy consulted for ACS. Trop elevated at 543.  She was on Coumadin prior to her stroke, and  while she was inpatient in [facility] acute rehab her consulting physician switched her over to Eliquis. Now, medical team is switching patient back to warfarin. Patient was on prescribed warfarin prior to apixaban  for antiphospholipid antibody syndrome. Prior notes state goal INR was 2.5 to 3.5. Dr. Ubaldo Glassing recommended a goal of 2 to 3. Will aim for an INR goal of 2.5 to 3, while inpatient. DDI: APAP (increase bleeding), escitalopram (increase bleeding), hypothroidism (decreases effect of warfarin). NO major DDIs.   Home regimen in January 2022 was warfarin 2mg : Tu,Th, Sa,Su Warfarin 3mg : M,W,F.   Date INR Warfarin Dose  4/9 1.6   4/10 -- 2 mg  4/11 1.4 4 mg    Goal of Therapy:  INR: 2.5 - 3.  Monitor platelets by anticoagulation protocol: Yes    Plan:  Enoxaparin:  Ordered enoxaparin 82.5mg  Q12 hours(1mg /kg BID dosing ordered) to help bridge until INR is therapeutic.  Warfarin:   INR is subtherapeutic. Will give warfarin 4 mg x 1 tonight. Daily INR ordered.   Pearla Dubonnet, PharmD Clinical Pharmacist 03/13/2021 12:42 PM

## 2021-03-13 NOTE — Evaluation (Signed)
Occupational Therapy Evaluation Patient Details Name: [name] MRN: [id number] DOB: November 01, 1951 Today's Date: 03/13/2021    History of Present Illness 70 y.o. female who presents emergency department for chief concerns of confusion. MRI reveals embolic pattern acute infarcts involving the infra and supratentorial brain and extensive chronic ischemic injury. PMH: hypertension, history of right trimalleolar ankle fracture, hypothyroid, history of DVT, hyperlipidemia, depression, mild cognitive decline, history of CVA, right PCA infarct.   Clinical Impression   Pt seen for OT evaluation on this date. Upon arrival to room, pt awake with RN at bedside discharging IV. Pt alert and oriented to self and some aspects of situation (aware that she had a stroke and broken ankle after a fall, but unable to recall RLE NWB precaution). PLOF obtained from daughter who was present at end of session. Per daughter, pt was independent with ADLs/functional mobility prior to Jan 2022 admission. Since Jan 2022, pt has been in rehab and was working on OOB mobility and seated ADLs. This date, pt appearing restless and presenting with decreased sustained attention, inconsistently following 1-step commands, requiring MAX verbal cues for attending to bed-level ADLs. Pt required MAX A for bed-level LB dressing, MIN A for feeding, SUPERVISION/SET-UP to wash/dry face with washcloth at bed-level, and SUPERVISION for rolling to R side. Pt would benefit from additional killed OT services to maximize return to PLOF and minimize risk of future falls, injury, caregiver burden, and readmission. Discharge recommendation: SNF.    Follow Up Recommendations  SNF    Equipment Recommendations  None recommended by OT       Precautions / Restrictions Precautions Precautions: Fall Restrictions Weight Bearing Restrictions: Yes RLE Weight Bearing: Non weight bearing      Mobility Bed Mobility Overal bed mobility: Needs  Assistance Bed Mobility: Rolling Rolling: Supervision         General bed mobility comments: Pt able to roll supine>R side Patient Response: Restless  Transfers                          ADL either performed or assessed with clinical judgement   ADL Overall ADL's : Needs assistance/impaired Eating/Feeding: Minimal assistance;Cueing for sequencing;Bed level Eating/Feeding Details (indicate cue type and reason): MIN A for bringing items from tray table to pt's hands d/t pt's decreased attention. Pt able to bring cup and finger food to mouth Grooming: Wash/dry face;Supervision/safety;Set up;Bed level               Lower Body Dressing: Maximal assistance;Bed level Lower Body Dressing Details (indicate cue type and reason): To don socks. Pt able to demo figure-4 position with L LE and pull sock up over heel. Did not move R LE                     Vision Baseline Vision/History: Wears glasses (left homonymous hemianopsia)              Pertinent Vitals/Pain Pain Assessment: Faces Faces Pain Scale: Hurts even more Pain Location: L Hip Pain Descriptors / Indicators: Discomfort;Guarding Pain Intervention(s): Limited activity within patient's tolerance;Monitored during session;RN gave pain meds during session     Hand Dominance Right   Extremity/Trunk Assessment Upper Extremity Assessment Upper Extremity Assessment: Generalized weakness;Difficult to assess due to impaired cognition (Grossly at least 3/5 however difficult to formally assess strength and sensation d/t cognition)   Lower Extremity Assessment Lower Extremity Assessment: Defer to PT evaluation  Cognition Arousal/Alertness: Awake/alert Behavior During Therapy: Restless Overall Cognitive Status: Impaired/Different from baseline                                 General Comments: Pt alert and oriented to self and some aspects of situation (aware that she had a stroke and  broken ankle after a fall, but unable to recall RLE NWB precaution). Pt with decreased sustained attention, inconsistently following 1-step commands, requiring MAX verbal cues for attending to bed-level ADLs   General Comments               Home Living Family/patient expects to be discharged to:: [place]: Spouse/significant other                               Additional Comments: Pt has been in rehab since Jan 2022. Prior to Jan 2022, pt was living with her husband in a multi-level house w/ 5 steps to enter      Prior Functioning/Environment Level of Independence: Needs assistance  Gait / Transfers Assistance Needed: Prior to Jan 2022, pt was independent w/out AD/DME for functional mobility. ADL's / Homemaking Assistance Needed: Prior to Jan 2022, pt was independent with ADLs/IADLs and was a caregiver for her husband. Per daughter, pt was working on completing seated ADLs while in rehab            OT Problem List: Decreased strength;Decreased range of motion;Decreased activity tolerance;Impaired balance (sitting and/or standing);Decreased cognition;Pain      OT Treatment/Interventions: Self-care/ADL training;Therapeutic exercise;Energy conservation;DME and/or AE instruction;Therapeutic activities;Patient/family education;Balance training    OT Goals(Current goals can be found in the care plan section) Acute Rehab OT Goals Patient Stated Goal: To eat more OT Goal Formulation: With family Time For Goal Achievement: 03/27/21 Potential to Achieve Goals: Good ADL Goals Pt Will Perform Grooming: with supervision;sitting Pt Will Perform Upper Body Dressing: with supervision;sitting Pt Will Transfer to Toilet: with min assist;with +2 assist;stand pivot transfer (while adhering to NWB precautions)  OT Frequency: Min 1X/week    AM-PAC OT "6 Clicks" Daily Activity     Outcome Measure Help from another person eating meals?: A  Little Help from another person taking care of personal grooming?: A Little Help from another person toileting, which includes using toliet, bedpan, or urinal?: A Lot Help from another person bathing (including washing, rinsing, drying)?: A Lot Help from another person to put on and taking off regular upper body clothing?: A Little Help from another person to put on and taking off regular lower body clothing?: A Lot 6 Click Score: 15   End of Session Nurse Communication: Mobility status  Activity Tolerance: Patient limited by pain Patient left: in bed;with call bell/phone within reach;with bed alarm set;with nursing/sitter in room;with family/visitor present  OT Visit Diagnosis: Muscle weakness (generalized) (M62.81);Other symptoms and signs involving cognitive function;Pain Pain - Right/Left: Left Pain - part of body: Leg                Time: 4008-6761 OT Time Calculation (min): 21 min Charges:  OT General Charges $OT Visit: 1 Visit OT Evaluation $OT Eval Moderate Complexity: 1 Mod OT Treatments $Self Care/Home Management : 8-22 mins  Fredirick Maudlin, OTR/L [place]

## 2021-03-13 NOTE — Progress Notes (Signed)
Patient Name: [name] [facility] Date of Encounter: 03/13/2021  Hospital Problem List     Active Problems:   Adult hypothyroidism   Essential hypertension   Anticoagulant long-term use   Antiphospholipid antibody syndrome (HCC)   Stage 3 chronic kidney disease (HCC)   Thrombocytopenia (HCC)   CVA (cerebral vascular accident) ([place])   Ischemic cerebrovascular accident (CVA) of frontal lobe (HCC)   Embolism of left anterior cerebral artery   AKI (acute kidney injury) ([place])   Pressure injury of skin   Encephalopathy    Patient Profile     70 y.o. female with history of hypertension,history of right trimalleoli are fracture,hypothyroid, history of DVT and antiphospholipid antibody syndrome previously on Coumadin, CKD 3B, hyperlipidemia, depression, mild cognitive decline, history of CVA, right PCA infarct, presents emergency department for chief concerns of confusion. This has apparently been there for several weeks. Brain MRI done today revealed evidence of shower of embolic pattern acute infarcts invovling the infr and suprtentorial brain.  Patient had been on warfarin for quite a few years and then had difficulty managing the levels and had a CVA felt to be secondary to an adequate INR and was converted to Eliquis.  As mentioned above over the past several weeks there has been altered mental status and confusion.  She was brought to the emergency room where laboratories revealed acute on chronic renal insufficiency with a creatinine up to 2.03 sodium 132, hemoglobin of 7.6 which has been gradually declining from a value of 10.32 months ago.  Protocol serum troponins were drawn showed High-sensitivity troponins of 543 and 564.  EKG showed sinus rhythm with no ischemia.  No arrhythmia.  Patient denied chest pain.  Patient was placed on heparin Eliquis stopped due to the elevated troponins.  She was placed on a statin.  Brain CT done yesterday in emergency room showed prior infarct of the  right occipital lobe superior posterior right parietal lobe and posterior mid left cerebellum.  No acute infarct or bleeding was noted.  Brain MRI done today showed shower of embolic pattern acute infarcts involving the infra and supratentorial brain.  Extensive chronic ischemic injury.  These were bilateral.  Patient had an echo in January showing normal EF.  No shunt by color-flow Doppler.  Patient now lives in a care facility.  She has been compliant with her Eliquis.  Subjective   No complaints this am. On heparin and started on warfarin.   Inpatient Medications    . atorvastatin  20 mg Oral QHS  . buPROPion  150 mg Oral Daily  . escitalopram  20 mg Oral Daily  . famotidine  20 mg Oral Daily  . ferrous sulfate  325 mg Oral QHS  . levothyroxine  100 mcg Oral Q0600  . mouth rinse  15 mL Mouth Rinse BID  . melatonin  5 mg Oral QHS  . montelukast  10 mg Oral QHS  . multivitamin with minerals   Oral Daily  . oxybutynin  15 mg Oral QHS  . vitamin B-12  1,000 mcg Oral Daily  . Warfarin - Pharmacist Dosing Inpatient   Does not apply q1600    Vital Signs    Vitals:   03/12/21 1350 03/12/21 1640 03/12/21 2013 03/13/21 0415  BP: (!) 147/82 (!) 155/96 (!) 160/97 (!) 149/86  Pulse: 92 84 (!) 108 (!) 105  Resp: (!) 23 20 18    Temp: 97.8 F (36.6 C) 98.4 F (36.9 C) 98.4 F (36.9 C) 97.9 F (36.6  C)  TempSrc: Oral Oral Oral   SpO2: 100% 100% 97% 100%  Weight:      Height:        Intake/Output Summary (Last 24 hours) at 03/13/2021 0713 Last data filed at 03/13/2021 0636 Gross per 24 hour  Intake 131.92 ml  Output 1736 ml  Net -1604.08 ml   Filed Weights   03/11/21 0921  Weight: 83.5 kg    Physical Exam    GEN: Well nourished, well developed, in no acute distress.  HEENT: normal.  Neck: Supple, no JVD, carotid bruits, or masses. Cardiac: RRR, no murmurs, rubs, or gallops. No clubbing, cyanosis, edema.  Radials/DP/PT 2+ and equal bilaterally.  Respiratory:  Respirations  regular and unlabored, clear to auscultation bilaterally. GI: Soft, nontender, nondistended, BS + x 4. MS: no deformity or atrophy. Skin: warm and dry, no rash. Neuro:  Strength and sensation are intact. Psych: Normal affect.  Labs    CBC Recent Labs    03/11/21 0926 03/12/21 0553 03/13/21 0402  WBC 8.5 7.3 9.5  NEUTROABS 6.3  --  7.3  HGB 8.6* 7.6* 7.6*  HCT 26.4* 24.0* 23.4*  MCV 94.6 96.8 95.1  PLT 104* 80* 78*   Basic Metabolic Panel Recent Labs    03/12/21 0553 03/13/21 0402  NA 137 136  K 4.1 4.0  CL 103 104  CO2 24 24  GLUCOSE 88 109*  BUN 31* 26*  CREATININE 1.80* 1.77*  CALCIUM 9.5 9.7  MG  --  1.8  PHOS  --  3.5   Liver Function Tests Recent Labs    03/11/21 0926  AST 46*  ALT 44  ALKPHOS 262*  BILITOT 0.9  PROT 7.2  ALBUMIN 2.7*   No results for input(s): LIPASE, AMYLASE in the last 72 hours. Cardiac Enzymes No results for input(s): CKTOTAL, CKMB, CKMBINDEX, TROPONINI in the last 72 hours. BNP Recent Labs    03/11/21 1323  BNP 341.9*   D-Dimer No results for input(s): DDIMER in the last 72 hours. Hemoglobin A1C No results for input(s): HGBA1C in the last 72 hours. Fasting Lipid Panel No results for input(s): CHOL, HDL, LDLCALC, TRIG, CHOLHDL, LDLDIRECT in the last 72 hours. Thyroid Function Tests Recent Labs    03/12/21 1216  TSH 4.896*    Telemetry    nsr with occasional PVCs. No runs of svt , afib of vt. Some artifact present  ECG    nsr with no ischemia  Radiology    DG Chest 2 View  Result Date: 03/11/2021 CLINICAL DATA:  Confusion EXAM: CHEST - 2 VIEW COMPARISON:  January 08, 2021 FINDINGS: Interstitium is mildly thickened. No edema or airspace opacity. Heart size and pulmonary vascularity are normal. No adenopathy. No bone lesions. Surgical clips noted in gallbladder fossa region. IMPRESSION: Interstitial thickening, likely indicative of underlying chronic bronchitis. No edema or airspace opacity. Cardiac silhouette  normal. Electronically Signed   By: Lowella Grip III M.D.   On: 03/11/2021 09:56   DG Ankle Complete Right  Result Date: 02/22/2021 CLINICAL DATA:  Right ankle pain, fell EXAM: RIGHT ANKLE - COMPLETE 3+ VIEW COMPARISON:  05/23/2020 FINDINGS: Frontal, oblique, lateral views of the right ankle are obtained. There is an oblique displaced lateral malleolar fracture, with lateral subluxation of the talus relative to the tibial plafond. A minimally comminuted transverse medial malleolar fracture is also noted, with mild distraction. The minimally displaced fracture through the posterior malleolus is noted on lateral view. There is diffuse soft tissue swelling. Large inferior  calcaneal spur. IMPRESSION: 1. Trimalleolar right ankle fracture, with lateral subluxation of the talus. 2. Diffuse soft tissue swelling. Electronically Signed   By: Randa Ngo M.D.   On: 02/22/2021 20:41   CT Head Wo Contrast  Result Date: 03/11/2021 CLINICAL DATA:  Altered mental status/confusion EXAM: CT HEAD WITHOUT CONTRAST TECHNIQUE: Contiguous axial images were obtained from the base of the skull through the vertex without intravenous contrast. COMPARISON:  February 22, 2021 FINDINGS: Brain: Moderate diffuse atrophy is stable. There is no intracranial mass, hemorrhage, extra-axial fluid collection, or midline shift. Decreased attenuation is noted with encephalomalacia in the medial right occipital lobe consistent with prior infarct. Prior infarct also noted in the posterosuperior right parietal lobe, stable. A prior infarct is noted in the posterior aspect of the mid left cerebellum, stable. There is mild decreased attenuation in portions of the centra semiovale bilaterally. No acute appearing infarct is evident. Vascular: There is no hyperdense vessel. There is calcification in each carotid siphon region. Skull: The bony calvarium appears intact. Sinuses/Orbits: There is slight mucosal thickening in several ethmoid air cells.  Visualized orbits appear symmetric bilaterally. Other: Mastoid air cells are clear. IMPRESSION: Stable atrophy. Prior infarcts in the medial right occipital lobe, superior posterior right parietal lobe, and posterior mid left cerebellum. Decreased attenuation in the periventricular white matter is likely due to small vessel vascular disease, although a mild degree of interstitial edema secondary to the ventricular enlargement could present in this manner. No acute infarct evident. No mass or hemorrhage. There are foci of arterial vascular calcification. There is slight mucosal thickening in several ethmoid air cells. Electronically Signed   By: Lowella Grip III M.D.   On: 03/11/2021 10:00   CT Head Wo Contrast  Result Date: 02/22/2021 CLINICAL DATA:  [place]. EXAM: CT HEAD WITHOUT CONTRAST CT CERVICAL SPINE WITHOUT CONTRAST TECHNIQUE: Multidetector CT imaging of the head and cervical spine was performed following the standard protocol without intravenous contrast. Multiplanar CT image reconstructions of the cervical spine were also generated. COMPARISON:  None. FINDINGS: CT HEAD FINDINGS Brain: Age advanced cerebral atrophy and associated ventriculomegaly. Evidence of multiple prior infarcts most notably in the right occipital lobe with associated encephalomalacia and ex vacuo dilatation of the occipital horn of the right lateral ventricle. I do not see any findings suggestive of an acute hemispheric infarction. No intracranial hemorrhage. No extra-axial fluid collections. Vascular: Vascular calcifications but no definite aneurysm or hyperdense vessels. Skull: No skull fracture or bone lesions. Sinuses/Orbits: The paranasal sinuses and mastoid air cells are clear. The globes are intact. Other: No scalp lesions or scalp hematoma. CT CERVICAL SPINE FINDINGS Alignment: Normal overall alignment of the cervical vertebral bodies. Skull base and vertebrae: No acute fracture. No primary bone lesion or focal pathologic  process. Soft tissues and spinal canal: No prevertebral fluid or swelling. No visible canal hematoma. Disc levels: Degenerative cervical spondylosis with disc disease and facet disease most notable at C4-5 and C5-6. No large disc protrusions or significant spinal stenosis. Uncinate spurring and facet disease but no significant foraminal stenosis. Upper chest: The visualized lung apices are grossly clear. Other: No neck mass, adenopathy or hematoma. IMPRESSION: 1. Age advanced cerebral atrophy and associated ventriculomegaly. 2. Evidence of multiple prior infarcts. 3. No acute intracranial findings or skull fracture. 4. Degenerative cervical spondylosis with disc disease and facet disease but no acute cervical spine fracture. Electronically Signed   By: Marijo Sanes M.D.   On: 02/22/2021 21:52   CT Cervical Spine Wo Contrast  Result Date: 02/22/2021 CLINICAL DATA:  [place]. EXAM: CT HEAD WITHOUT CONTRAST CT CERVICAL SPINE WITHOUT CONTRAST TECHNIQUE: Multidetector CT imaging of the head and cervical spine was performed following the standard protocol without intravenous contrast. Multiplanar CT image reconstructions of the cervical spine were also generated. COMPARISON:  None. FINDINGS: CT HEAD FINDINGS Brain: Age advanced cerebral atrophy and associated ventriculomegaly. Evidence of multiple prior infarcts most notably in the right occipital lobe with associated encephalomalacia and ex vacuo dilatation of the occipital horn of the right lateral ventricle. I do not see any findings suggestive of an acute hemispheric infarction. No intracranial hemorrhage. No extra-axial fluid collections. Vascular: Vascular calcifications but no definite aneurysm or hyperdense vessels. Skull: No skull fracture or bone lesions. Sinuses/Orbits: The paranasal sinuses and mastoid air cells are clear. The globes are intact. Other: No scalp lesions or scalp hematoma. CT CERVICAL SPINE FINDINGS Alignment: Normal overall alignment of the  cervical vertebral bodies. Skull base and vertebrae: No acute fracture. No primary bone lesion or focal pathologic process. Soft tissues and spinal canal: No prevertebral fluid or swelling. No visible canal hematoma. Disc levels: Degenerative cervical spondylosis with disc disease and facet disease most notable at C4-5 and C5-6. No large disc protrusions or significant spinal stenosis. Uncinate spurring and facet disease but no significant foraminal stenosis. Upper chest: The visualized lung apices are grossly clear. Other: No neck mass, adenopathy or hematoma. IMPRESSION: 1. Age advanced cerebral atrophy and associated ventriculomegaly. 2. Evidence of multiple prior infarcts. 3. No acute intracranial findings or skull fracture. 4. Degenerative cervical spondylosis with disc disease and facet disease but no acute cervical spine fracture. Electronically Signed   By: Marijo Sanes M.D.   On: 02/22/2021 21:52   MR BRAIN WO CONTRAST  Result Date: 03/12/2021 CLINICAL DATA:  Confusion. Recent history of stroke. Hypercoagulable. EXAM: MRI HEAD WITHOUT CONTRAST TECHNIQUE: Multiplanar, multiecho pulse sequences of the brain and surrounding structures were obtained without intravenous contrast. COMPARISON:  12/26/2020 FINDINGS: Brain: Innumerable subcentimeter mainly cortically based acute infarcts along the bilateral cerebral convexity with predilection for the watershed regions. Similar size infarcts are seen in the bilateral cerebellum. Numerous pre-existing cerebellar and cortical infarcts with generalized brain atrophy. Large remote right occipital infarct with dense encephalomalacia. No acute hemorrhage, hydrocephalus, or mass. Vascular: Major flow voids are preserved Skull and upper cervical spine: Normal marrow signal Sinuses/Orbits: Negative IMPRESSION: 1. Shower of embolic pattern acute infarcts involving the infra and supratentorial brain. 2. Extensive chronic ischemic injury. Electronically Signed   By:  Monte Fantasia M.D.   On: 03/12/2021 11:52   CT Renal Stone Study  Result Date: 03/11/2021 CLINICAL DATA:  70 year old with chronic kidney disease, presenting with a urinary tract infection. EXAM: CT ABDOMEN AND PELVIS WITHOUT CONTRAST TECHNIQUE: Multidetector CT imaging of the abdomen and pelvis was performed following the standard protocol without IV contrast. COMPARISON:  01/09/2021. FINDINGS: Lower chest: LEFT pleural effusion and associated mild passive atelectasis in th, broad-based central disc protrusion and multifactorial spinal stenosis at L4-5, and diffuse degenerative changes e LEFT LOWER LOBE. Visualized lung bases otherwise clear. Heart moderately enlarged. Small pericardial effusion versus pericardial thickening. Hepatobiliary: Normal unenhanced appearance of the liver. Surgically absent gallbladder. No unexpected biliary ductal dilation. Pancreas: Normal unenhanced appearance. Spleen: Subtle low attenuation involving the lower pole of the spleen, not present on the prior CT, with associated edema in the perisplenic fat. Adrenals/Urinary Tract: Normal appearing adrenal glands. Scarring and mild cortical thinning involving both kidneys. Allowing for the unenhanced technique, no  significant focal parenchymal abnormalities involving either kidney. No hydronephrosis. No urinary tract calculi. Normal appearing mildly distended urinary bladder. Stomach/Bowel: Stomach normal in appearance for the degree of distention. Normal-appearing small bowel. Mobile cecum positioned in the RIGHT UPPER QUADRANT. Large rectal colonic stool burden and moderate colonic stool burden elsewhere. High attenuation ingested material within the colon and in the normal appearing appendix located in the RIGHT mid abdomen. No focal colonic abnormality. Vascular/Lymphatic: Mild atherosclerosis involving the abdominal aorta without evidence of aneurysm. No pathologic lymphadenopathy. Reproductive: Normal-appearing uterus and  ovaries without evidence of adnexal mass. Other: Mild edema in the abdominal wall overlying both flanks, right greater than left. Musculoskeletal: Degenerative disc disease and spondylosis at L5-S1, broad-based central disc protrusion and multifactorial spinal stenosis at L4-5 and diffuse facet degenerative changes throughout the lumbar spine. No acute findings. IMPRESSION: 1. Subtle low attenuation involving the lower pole of the spleen, not present on the prior CT, with associated edema in the perisplenic fat, possibly indicating splenic infarct or splenic abscess. CT abdomen with contrast may be confirmatory. If the patient's renal function does not allow intravenous contrast administration, then ultrasound may be helpful in further evaluation. 2. No acute abnormalities otherwise involving the abdomen or pelvis. 3. LEFT pleural effusion and associated mild passive atelectasis in the LEFT LOWER LOBE. 4. Small pericardial effusion versus pericardial thickening. 5. Aortic Atherosclerosis (ICD10-I70.0). Electronically Signed   By: Evangeline Dakin M.D.   On: 03/11/2021 12:12    Assessment & Plan    70 y.o.femalewith history ofhypertension,history of right trimalleoli are fracture,hypothyroid, history of DVT and antiphospholipid antibody syndrome previously on Coumadin, CKD 3B, hyperlipidemia, depression, mild cognitive decline, history of CVA, right PCA infarct, presents emergency department for chief concerns of confusion. This has apparently been there for several weeks.Brain MRI shows shower of emboli. In nsr at present andshe has been compliant with eliquis. Will need change to warfarin. WIll need neurology input regading heparin for now. Will follow for afib on monitor. Consider echo with bubble study Although right to left shunting unlikely and was not seen on color doppler with echo several months ago.   Recommendations;  1 continue with IV heparin until therapeutic on warfarin. INR goal  between 2 and 3.  Not a candidate for restarting Eliquis as she appears to have failed this.  2.    Acute CVA-appreciate neurology input.    3.  Antiphospholipid antibody syndrome.-Hypercoagulable state while on Eliquis may be etiology of this.  No evidence of A. fib.  We will continue to follow.  May need long-term monitoring after discharge to assess whether A. fib is present however patient will need warfarin indefinitely for the antiphospholipid antibody syndrome and therefore this is less important.  Is in sinus rhythm at present.  Will need warfarin indefinitely and bridged if she needs to come off for procedures in the future  4.  Anemia-patient's hemoglobin is dropped significantly recently.    Hemoglobin stable today.  No evidence of bleeding.  5.  Elevated troponins-flat and is not consistent with acute coronary event.  Patient not a candidate for invasive cardiac evaluation at present due to multiple comorbidities including recent CVA, acute renal insufficiency.  Will review echo when available.  Signed, Javier Docker Nicklas Mcsweeney MD 03/13/2021, 7:13 AM  Pager: [phone]

## 2021-03-14 ENCOUNTER — Inpatient Hospital Stay
Admit: 2021-03-14 | Discharge: 2021-03-14 | Disposition: A | Discharge status: Home / Self Care | Payer: Medicare Other | Attending: Internal Medicine | Admitting: Internal Medicine

## 2021-03-14 ENCOUNTER — Inpatient Hospital Stay: Payer: Medicare Other

## 2021-03-14 DIAGNOSIS — E785 Hyperlipidemia, unspecified: Secondary | ICD-10-CM

## 2021-03-14 DIAGNOSIS — D649 Anemia, unspecified: Secondary | ICD-10-CM | POA: Diagnosis not present

## 2021-03-14 DIAGNOSIS — G9341 Metabolic encephalopathy: Secondary | ICD-10-CM | POA: Diagnosis not present

## 2021-03-14 DIAGNOSIS — R109 Unspecified abdominal pain: Secondary | ICD-10-CM

## 2021-03-14 DIAGNOSIS — D735 Infarction of spleen: Secondary | ICD-10-CM

## 2021-03-14 DIAGNOSIS — R410 Disorientation, unspecified: Secondary | ICD-10-CM

## 2021-03-14 DIAGNOSIS — N179 Acute kidney failure, unspecified: Secondary | ICD-10-CM | POA: Diagnosis not present

## 2021-03-14 DIAGNOSIS — E860 Dehydration: Secondary | ICD-10-CM

## 2021-03-14 DIAGNOSIS — N189 Chronic kidney disease, unspecified: Secondary | ICD-10-CM

## 2021-03-14 DIAGNOSIS — I6612 Occlusion and stenosis of left anterior cerebral artery: Secondary | ICD-10-CM | POA: Diagnosis not present

## 2021-03-14 DIAGNOSIS — D6861 Antiphospholipid syndrome: Secondary | ICD-10-CM

## 2021-03-14 DIAGNOSIS — R4181 Age-related cognitive decline: Secondary | ICD-10-CM

## 2021-03-14 DIAGNOSIS — N3001 Acute cystitis with hematuria: Secondary | ICD-10-CM

## 2021-03-14 DIAGNOSIS — Z7901 Long term (current) use of anticoagulants: Secondary | ICD-10-CM | POA: Diagnosis not present

## 2021-03-14 DIAGNOSIS — D68312 Antiphospholipid antibody with hemorrhagic disorder: Secondary | ICD-10-CM

## 2021-03-14 DIAGNOSIS — N183 Chronic kidney disease, stage 3 unspecified: Secondary | ICD-10-CM

## 2021-03-14 DIAGNOSIS — R935 Abnormal findings on diagnostic imaging of other abdominal regions, including retroperitoneum: Secondary | ICD-10-CM

## 2021-03-14 DIAGNOSIS — I82409 Acute embolism and thrombosis of unspecified deep veins of unspecified lower extremity: Secondary | ICD-10-CM

## 2021-03-14 DIAGNOSIS — E039 Hypothyroidism, unspecified: Secondary | ICD-10-CM | POA: Diagnosis not present

## 2021-03-14 LAB — CBC WITH DIFFERENTIAL/PLATELET
Abs Immature Granulocytes: 0.17 10*3/uL — ABNORMAL HIGH (ref 0.00–0.07)
Basophils Absolute: 0 10*3/uL (ref 0.0–0.1)
Basophils Relative: 1 %
Eosinophils Absolute: 0.2 10*3/uL (ref 0.0–0.5)
Eosinophils Relative: 2 %
HCT: 22 % — ABNORMAL LOW (ref 36.0–46.0)
Hemoglobin: 7.3 g/dL — ABNORMAL LOW (ref 12.0–15.0)
Immature Granulocytes: 2 %
Lymphocytes Relative: 21 %
Lymphs Abs: 1.8 10*3/uL (ref 0.7–4.0)
MCH: 31.6 pg (ref 26.0–34.0)
MCHC: 33.2 g/dL (ref 30.0–36.0)
MCV: 95.2 fL (ref 80.0–100.0)
Monocytes Absolute: 0.6 10*3/uL (ref 0.1–1.0)
Monocytes Relative: 8 %
Neutro Abs: 5.7 10*3/uL (ref 1.7–7.7)
Neutrophils Relative %: 66 %
Platelets: 94 10*3/uL — ABNORMAL LOW (ref 150–400)
RBC: 2.31 MIL/uL — ABNORMAL LOW (ref 3.87–5.11)
RDW: 13.5 % (ref 11.5–15.5)
WBC: 8.5 10*3/uL (ref 4.0–10.5)
nRBC: 0 % (ref 0.0–0.2)

## 2021-03-14 LAB — BASIC METABOLIC PANEL
Anion gap: 7 (ref 5–15)
BUN: 21 mg/dL (ref 8–23)
CO2: 25 mmol/L (ref 22–32)
Calcium: 9.4 mg/dL (ref 8.9–10.3)
Chloride: 104 mmol/L (ref 98–111)
Creatinine, Ser: 1.84 mg/dL — ABNORMAL HIGH (ref 0.44–1.00)
GFR, Estimated: 29 mL/min — ABNORMAL LOW (ref >60)
Glucose, Bld: 98 mg/dL (ref 70–99)
Potassium: 3.7 mmol/L (ref 3.5–5.1)
Sodium: 136 mmol/L (ref 135–145)

## 2021-03-14 LAB — LIPID PANEL
Cholesterol: 111 mg/dL (ref 0–200)
HDL: 38 mg/dL — ABNORMAL LOW (ref >40)
LDL Cholesterol: 53 mg/dL (ref 0–99)
Total CHOL/HDL Ratio: 2.9 RATIO
Triglycerides: 99 mg/dL (ref <150)
VLDL: 20 mg/dL (ref 0–40)

## 2021-03-14 LAB — ECHOCARDIOGRAM COMPLETE
AR max vel: 1.41 cm2
AV Area VTI: 1.78 cm2
AV Area mean vel: 1.29 cm2
AV Mean grad: 6 mmHg
AV Peak grad: 10 mmHg
Ao pk vel: 1.58 m/s
Area-P 1/2: 3.6 cm2
Height: 68 in
S' Lateral: 3.26 cm
Weight: 2944 oz

## 2021-03-14 LAB — PROTIME-INR
INR: 1.8 — ABNORMAL HIGH (ref 0.8–1.2)
Prothrombin Time: 20.4 seconds — ABNORMAL HIGH (ref 11.4–15.2)

## 2021-03-14 LAB — HEMOGLOBIN A1C
Hgb A1c MFr Bld: 5.1 % (ref 4.8–5.6)
Mean Plasma Glucose: 99.67 mg/dL

## 2021-03-14 IMAGING — US US ART/VEN ABD/PELV/SCROTUM DOPPLER LTD
1 series · 15 of 25 positions shown · non-contrast
Comparison: CT of the abdomen and pelvis [DATE]

CLINICAL DATA: Evaluate splenic abnormality on recent CT.

EXAM:
ULTRASOUND ABDOMEN LIMITED WITH DOPPLER
TECHNIQUE: Images were obtained at the area of concern at the spleen.

[Series 1: us abdomen limited · 15 of 34 slices shown]
[im 1/34]
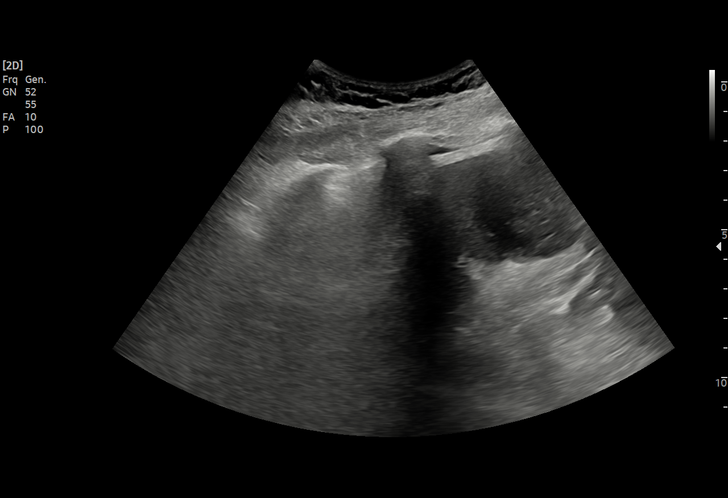
[im 3/34]
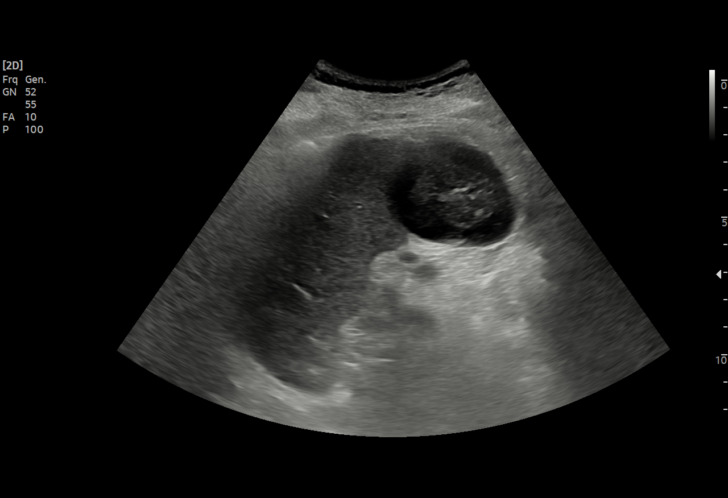
[im 6/34]
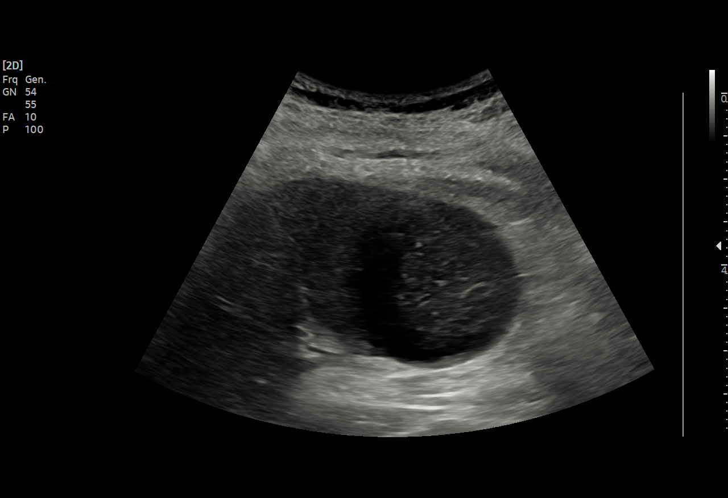
[im 7/34]
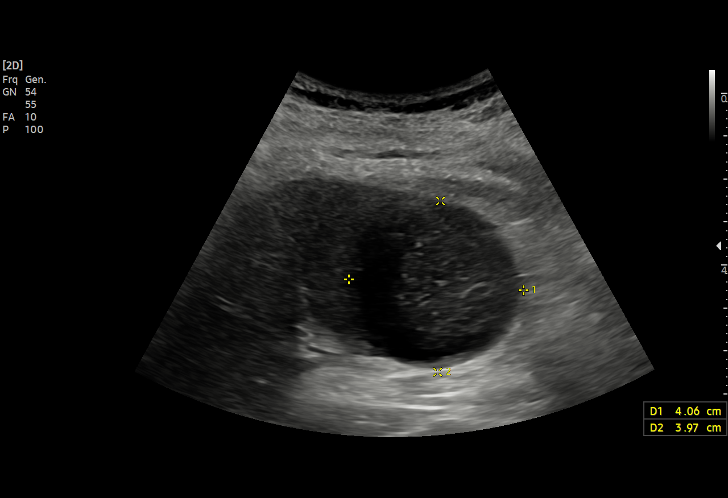
[im 10/34]
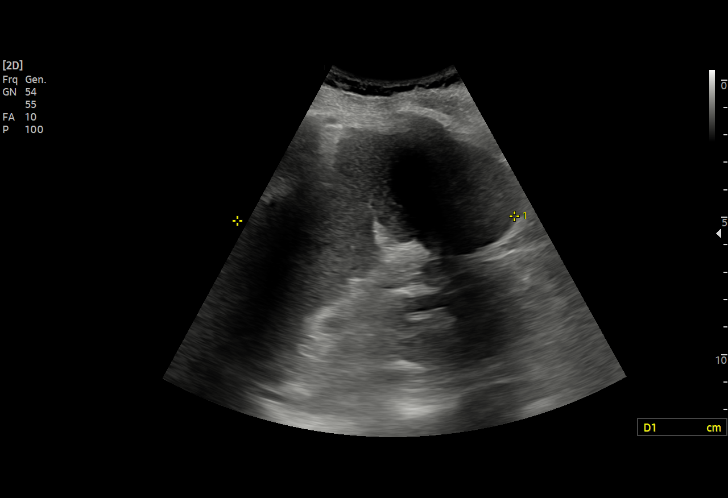
[im 13/34]
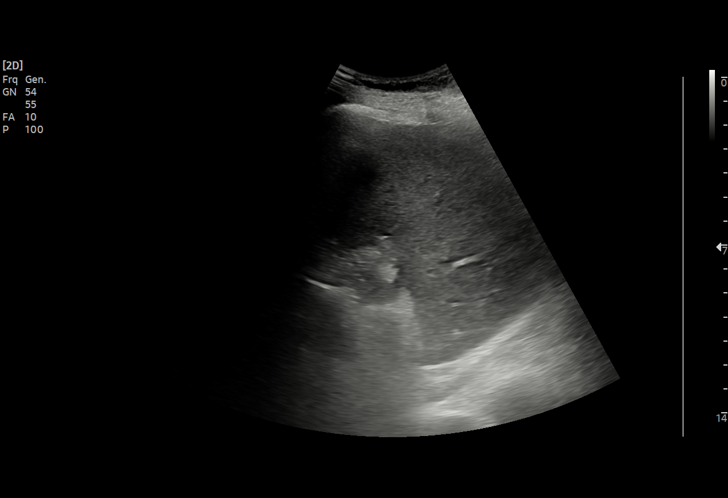
[im 14/34]
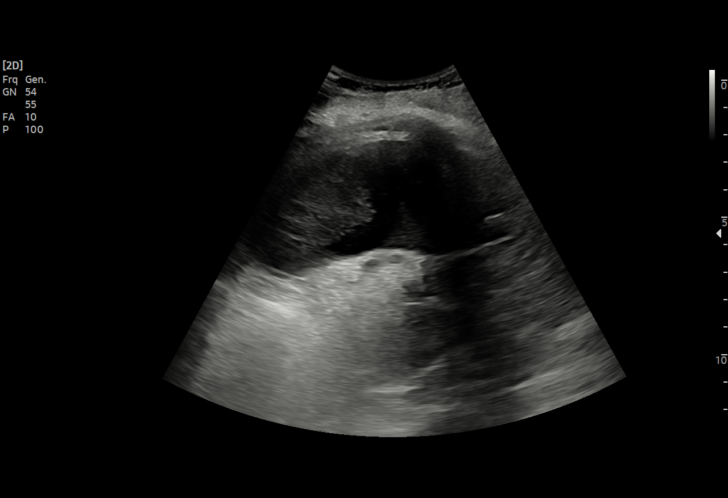
[im 17/34]
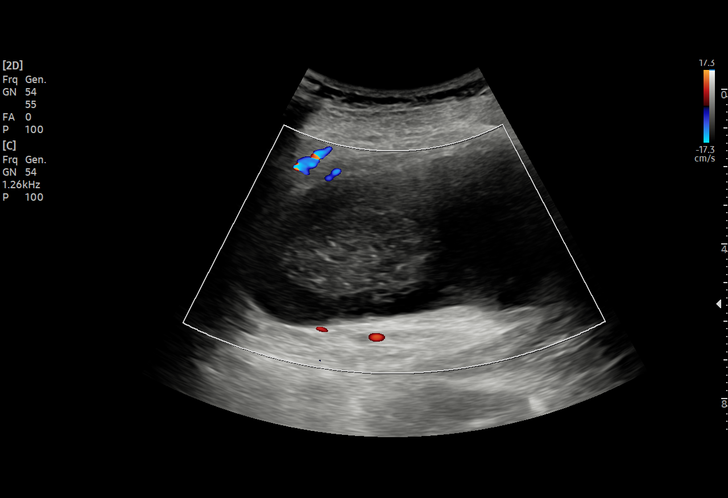
[im 20/34]
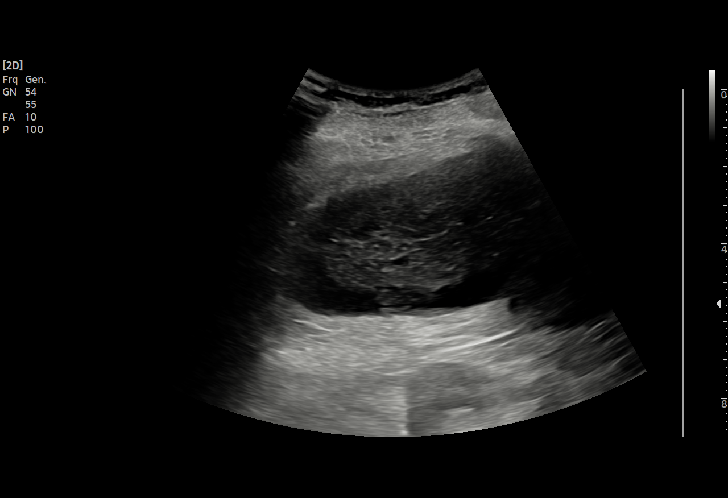
[im 21/34]
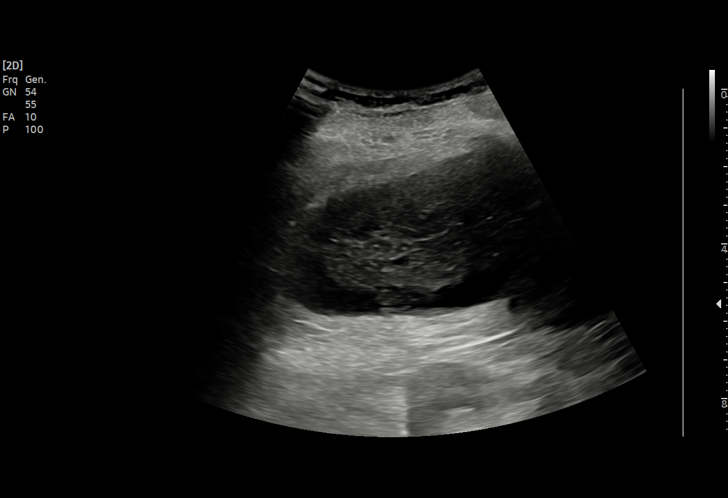
[im 24/34]
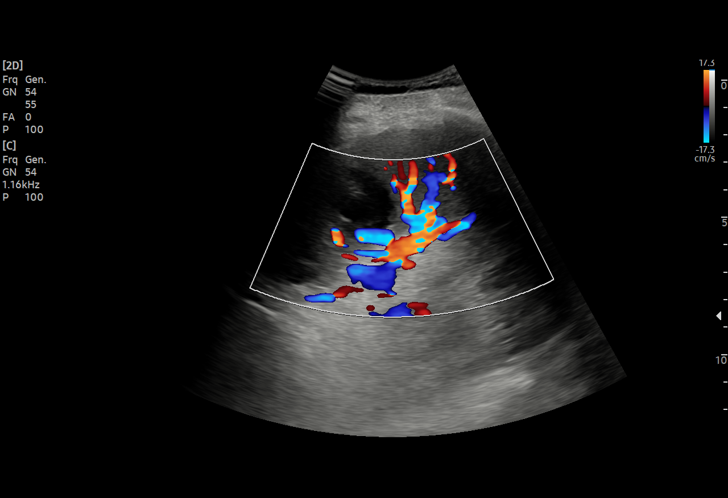
[im 27/34]
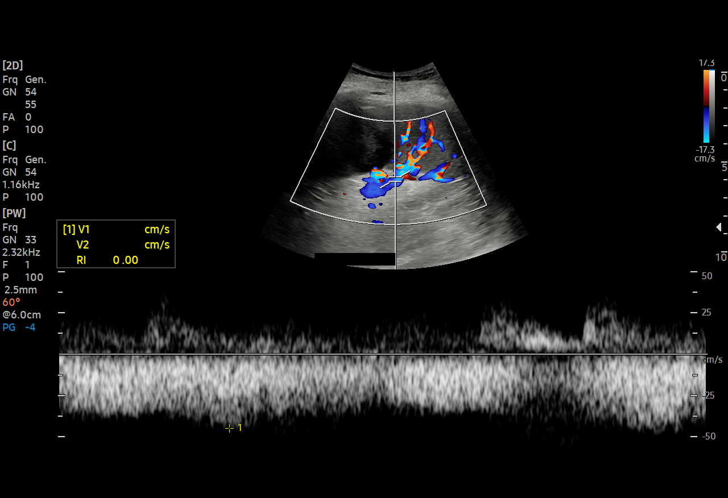
[im 28/34]
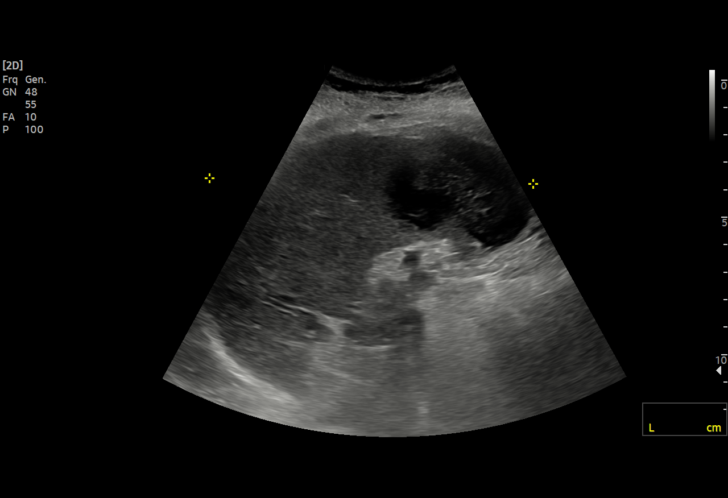
[im 31/34]
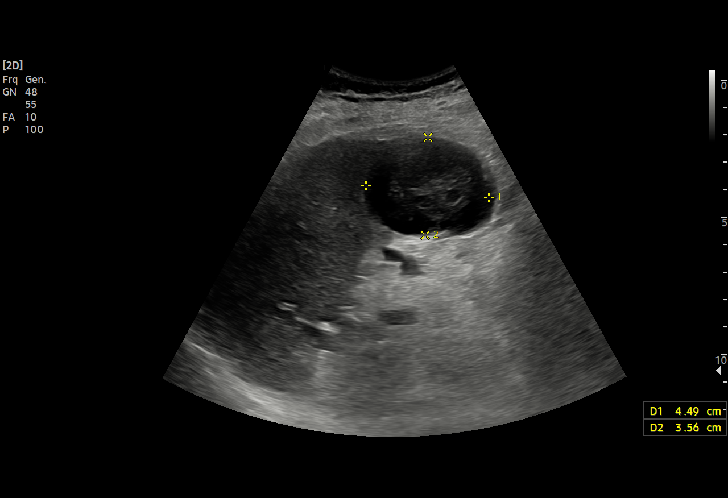
[im 34/34]
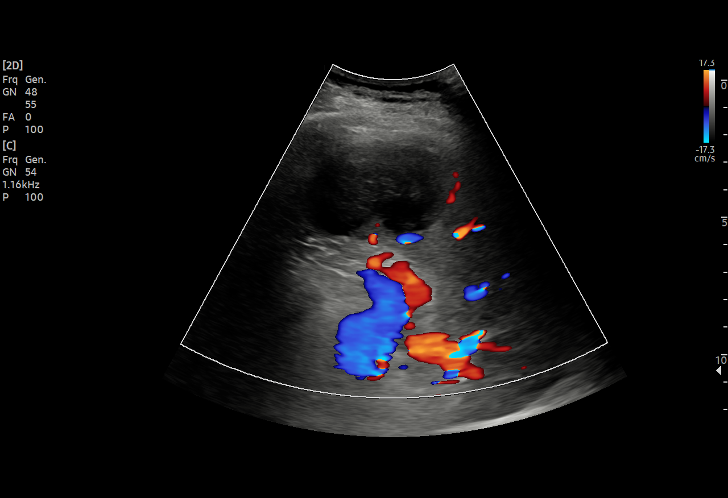

[15 of 25 positions shown; findings below may reference images not displayed]

FINDINGS: Spleen measures 11.8 x 5.3 x 12.7 cm. Calculated splenic volume is
397 mL. Superior and mid portions of the spleen have normal
echotexture. Along the inferior aspect of the spleen there is fluid
surrounding a solid structure which has similar echogenicity to the
spleen. Suspect this is fluid collection around a small area of
abnormal splenic tissue or fluid surrounding a splenic hematoma.
These findings correspond with the recent CT findings. This area of
concern with surrounding fluid measures 4.1 x 4.0 x 4.5 cm. Arterial
and venous flow at the splenic hilum. No significant internal
vascularity within the tissue surrounded by fluid.
IMPRESSION: Complex fluid collection along the inferior aspect of spleen
corresponding with the abnormality on the recent CT. The echogenic
material surrounded by fluid could represent abnormal splenic tissue
versus hematoma. The fluid collection and CT findings are more
suggestive for a hematoma due to a splenic laceration rather than an
infarct or abscess. Consider a follow-up ultrasound in 6-8 weeks to
see if the fluid collection resolves.

## 2021-03-14 IMAGING — CT CT ABD-PELV W/O CM
2 of 4 series · 16 of 46 positions shown, 18 images · non-contrast
Comparison: Ultrasound of same day. CT of [DATE] and
[DATE].

CLINICAL DATA: Left upper quadrant abdominal pain.

EXAM:
CT ABDOMEN AND PELVIS WITHOUT CONTRAST
TECHNIQUE: Multidetector CT imaging of the abdomen and pelvis was performed
following the standard protocol without IV contrast.

[Series 2: axial st · axial · 0.87mm/px · z∈[-471,-41]mm · 13 of 96 slices shown, 15 images]
[im 5/96  soft-tissue]
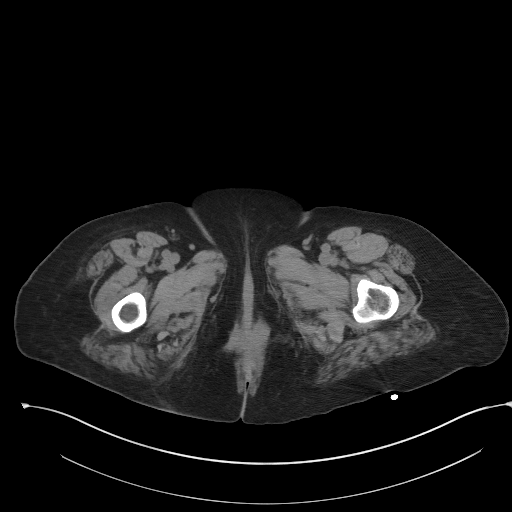
[im 5/96  bone]
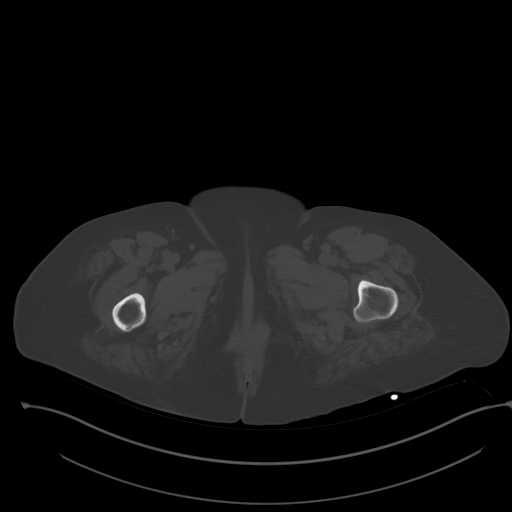
[im 13/96  soft-tissue]
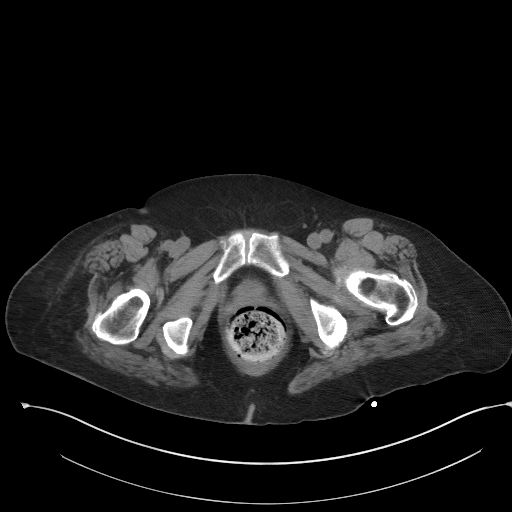
[im 21/96  soft-tissue]
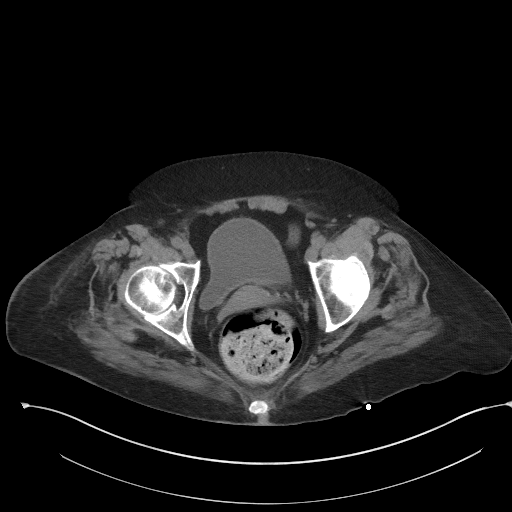
[im 25/96  soft-tissue]
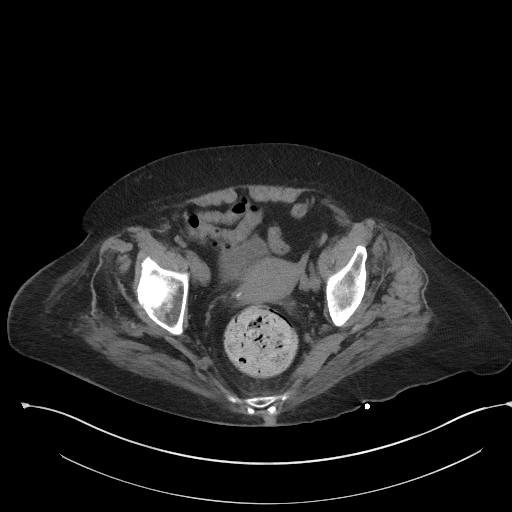
[im 34/96  soft-tissue]
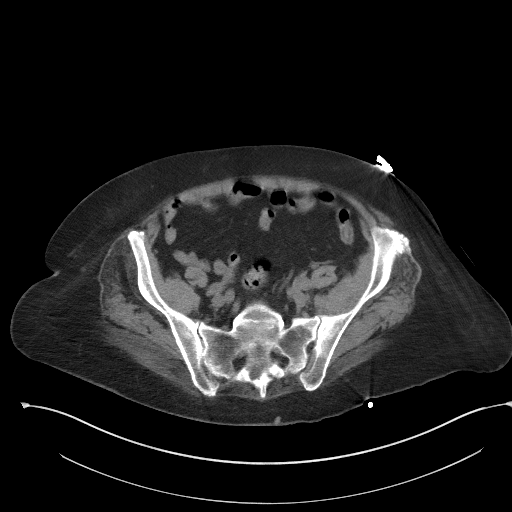
[im 42/96  soft-tissue]
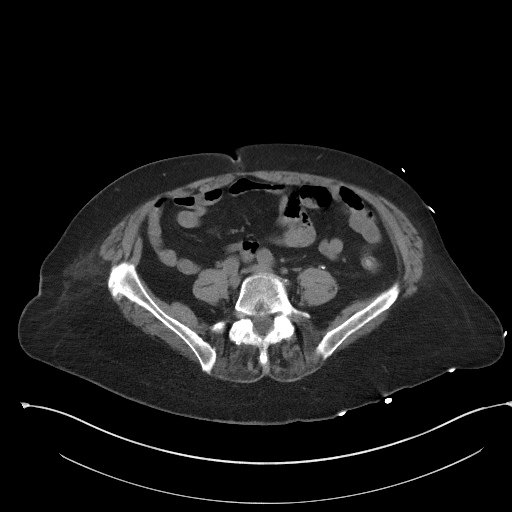
[im 50/96  soft-tissue]
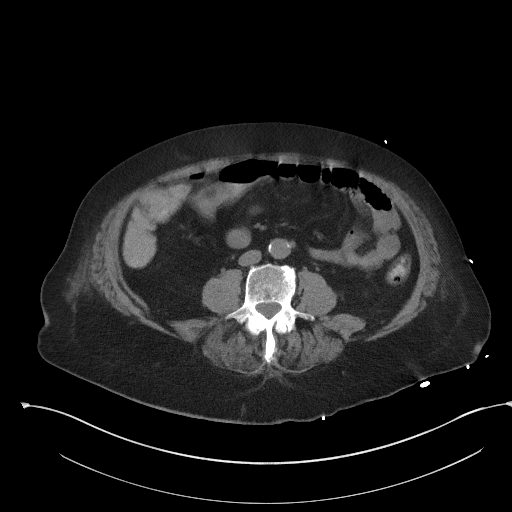
[im 54/96  soft-tissue]
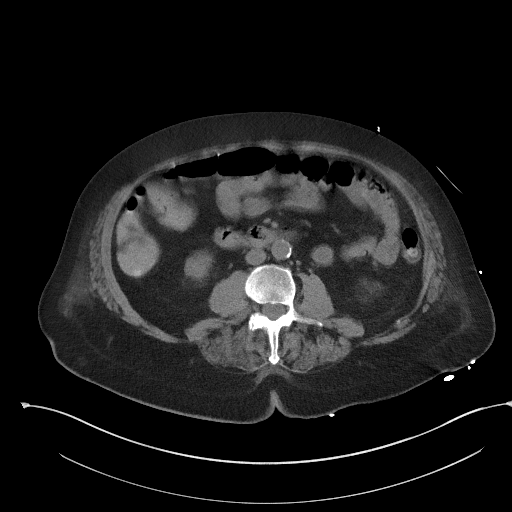
[im 62/96  soft-tissue]
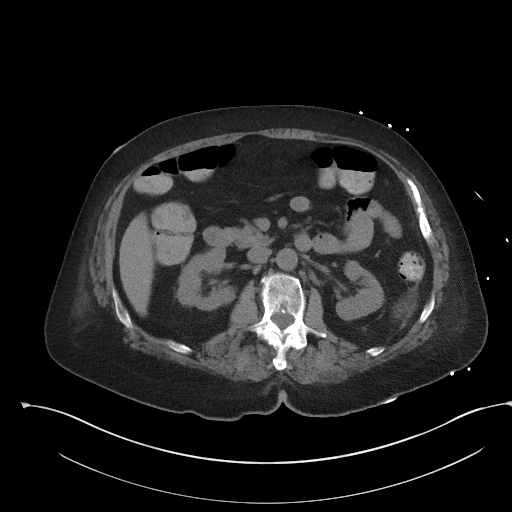
[im 62/96  bone]
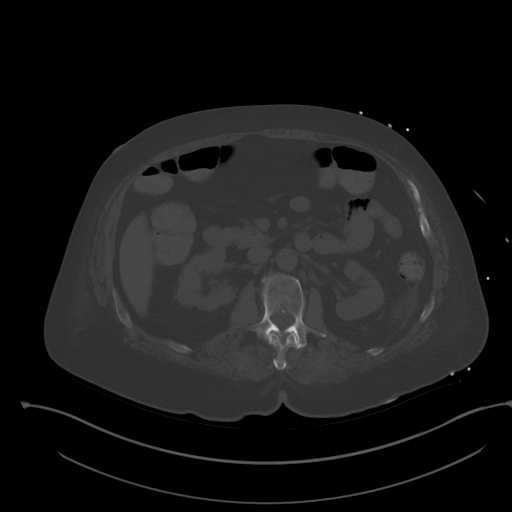
[im 71/96  soft-tissue]
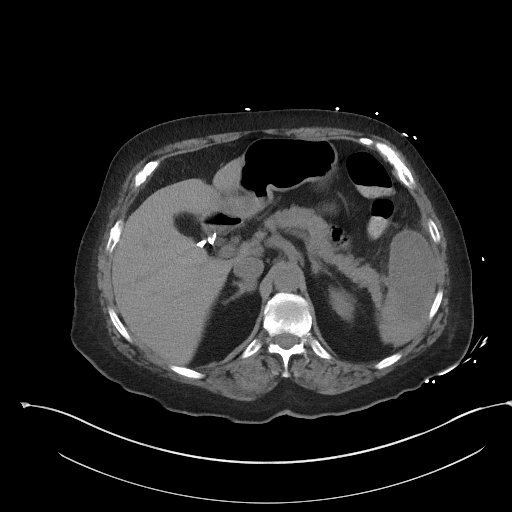
[im 75/96  soft-tissue]
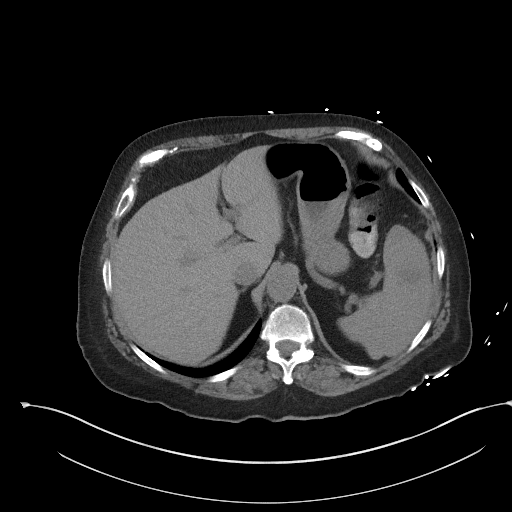
[im 83/96  soft-tissue]
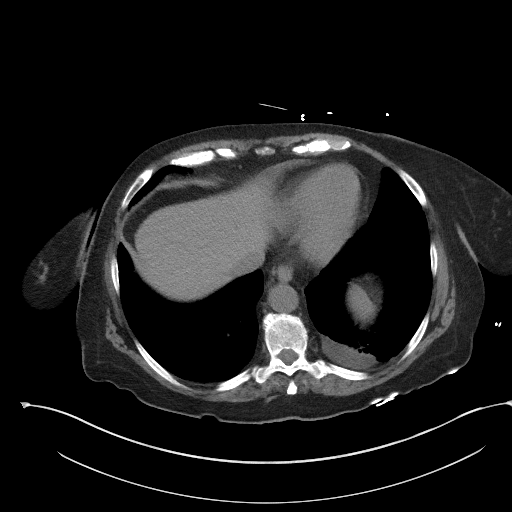
[im 91/96  soft-tissue]
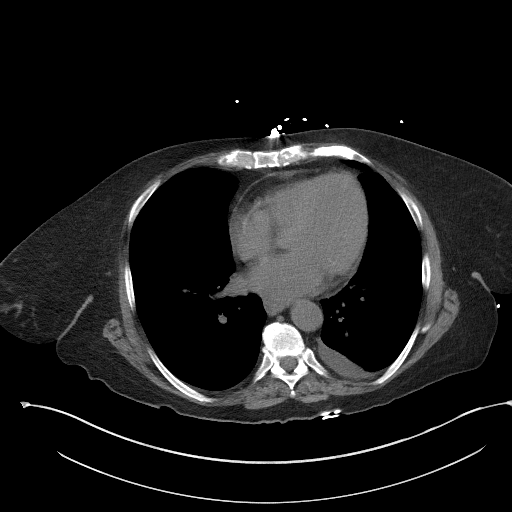

[Series 5: coronal st · coronal · 0.90mm/px · 3 of 94 slices shown]
[im 32/94  soft-tissue]
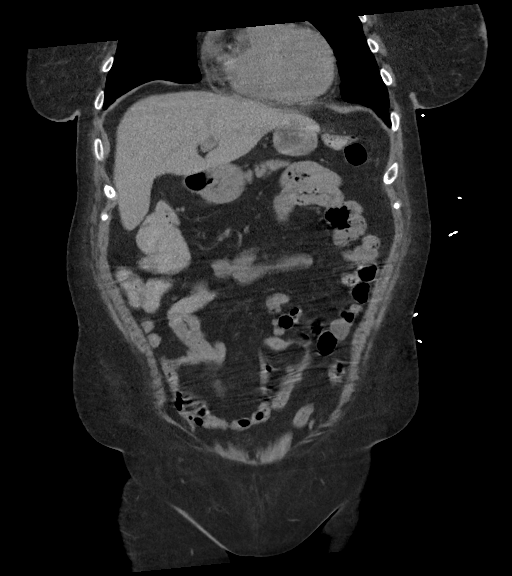
[im 42/94  soft-tissue]
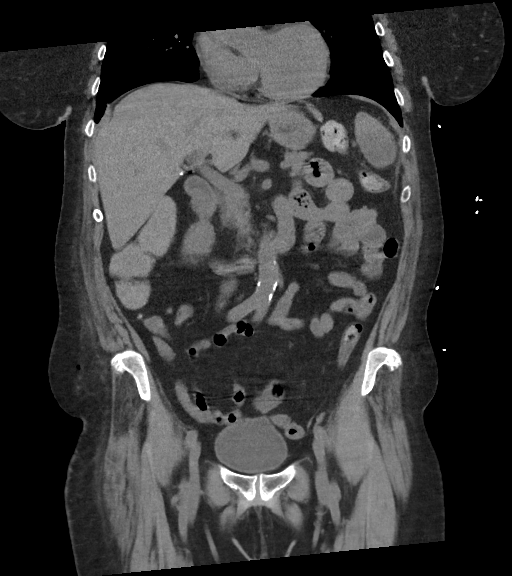
[im 52/94  soft-tissue]
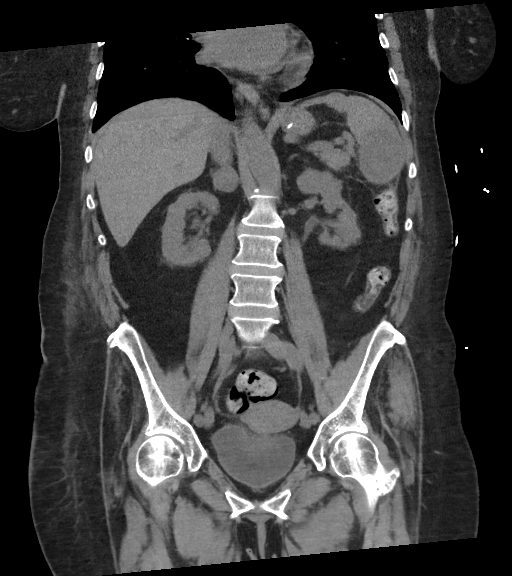

[16 of 46 positions shown; findings below may reference images not displayed]

FINDINGS: Lower chest: Small left pleural effusion is noted with minimal
adjacent subsegmental atelectasis.

Hepatobiliary: No focal liver abnormality is seen. Status post
cholecystectomy. No biliary dilatation.

Pancreas: Unremarkable. No pancreatic ductal dilatation or
surrounding inflammatory changes.

Spleen: Stable size and appearance of oval-shaped low density
involving inferior and anterior portion of the spleen most
consistent with subcapsular hematoma. Infarction is felt to be less
likely.

Adrenals/Urinary Tract: 2 cm right adrenal nodule is noted which is
unchanged compared to prior exams. Left adrenal gland is
unremarkable. Bilateral renal cortical thinning and scarring is
noted. No hydronephrosis or renal obstruction is noted. No renal or
ureteral calculi are noted. Urinary bladder is unremarkable.

Stomach/Bowel: The stomach and appendix appear normal. There is no
evidence of bowel obstruction. No inflammation is noted. Large
amount of stool is noted in the distal sigmoid colon and rectum
concerning for impaction.

Vascular/Lymphatic: Aortic atherosclerosis. No enlarged abdominal or
pelvic lymph nodes.

Reproductive: Uterus and bilateral adnexa are unremarkable.

Other: No abdominal wall hernia or abnormality. No abdominopelvic
ascites.

Musculoskeletal: No acute or significant osseous findings.
IMPRESSION: Grossly stable size and appearance of oval-shaped low density
involving inferior anterior portion of the spleen most consistent
with subcapsular hematoma. Infarction is felt to be less likely. As
recommended by ultrasound exam today, follow-up ultrasound in
several weeks is recommended to ensure stability or resolution of
this abnormality. These results were called by telephone at the time
of interpretation on [DATE] at [DATE] to provider CEOLA
CEOLA , who verbally acknowledged these results.

2 cm right adrenal nodule is noted. Follow-up CT or MRI in 12 months
is recommended to rule out neoplasm.

Large amount of stool is noted in the distal sigmoid colon and
rectum concerning for impaction.

Aortic Atherosclerosis ([KV]-[KV]).

## 2021-03-14 IMAGING — US US ABDOMEN LIMITED RUQ/ASCITES
1 series · 15 of 25 positions shown · non-contrast
Comparison: CT of the abdomen and pelvis [DATE]

CLINICAL DATA: Evaluate splenic abnormality on recent CT.

EXAM:
ULTRASOUND ABDOMEN LIMITED WITH DOPPLER
TECHNIQUE: Images were obtained at the area of concern at the spleen.

[Series 1: us abdomen limited · 15 of 34 slices shown]
[im 1/34]
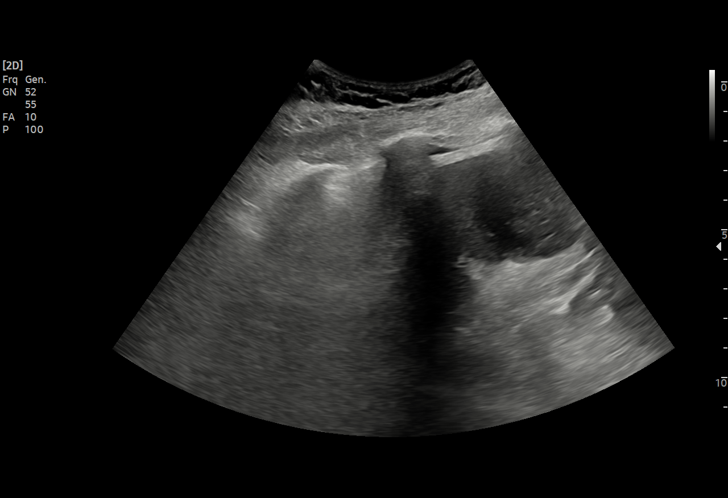
[im 3/34]
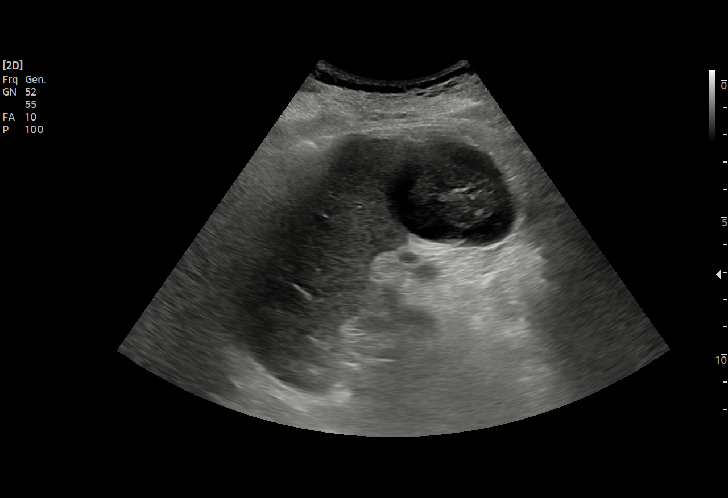
[im 6/34]
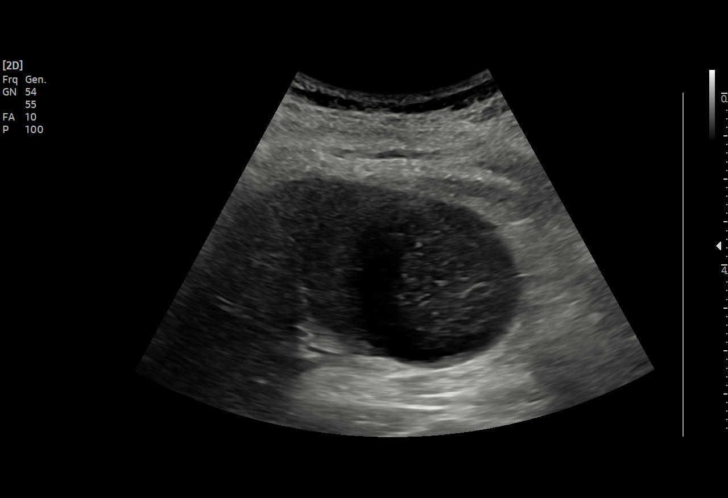
[im 7/34]
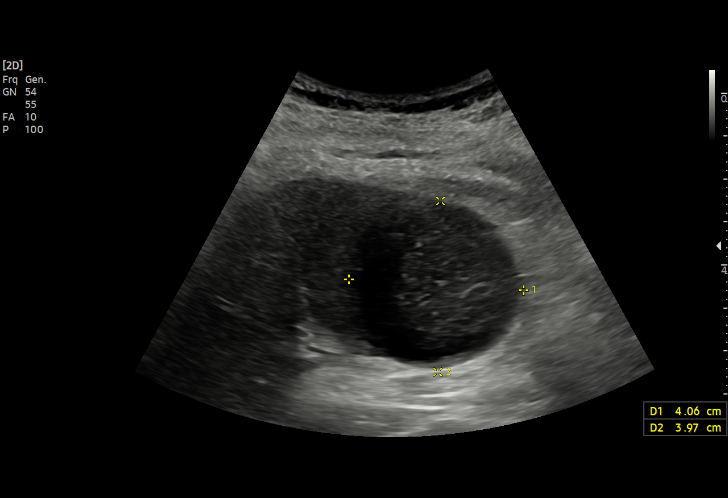
[im 10/34]
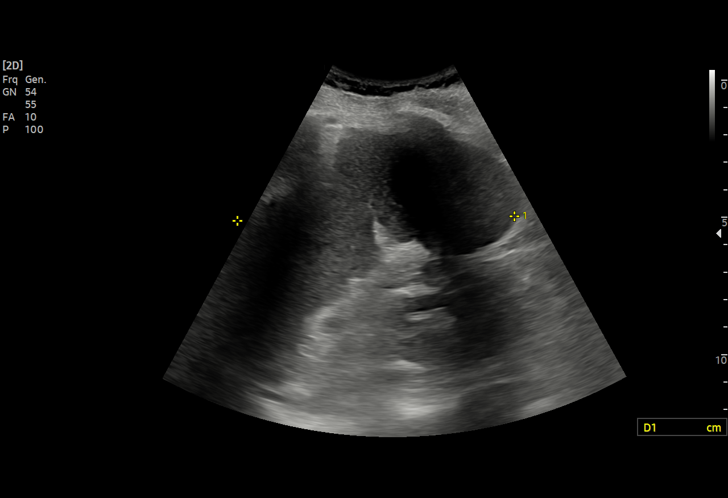
[im 13/34]
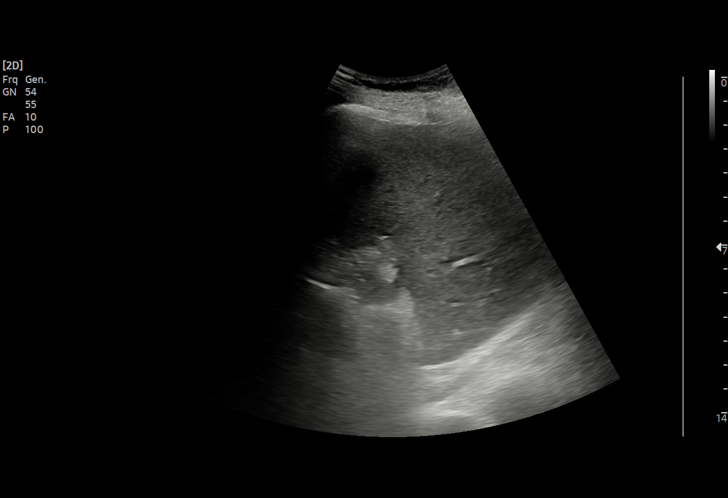
[im 14/34]
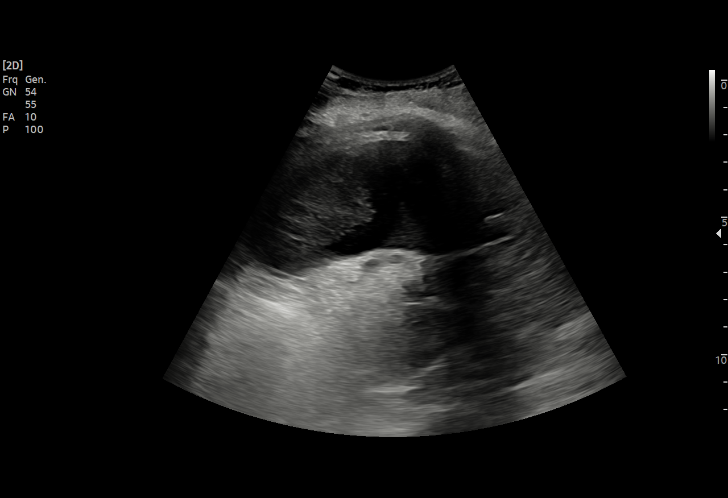
[im 17/34]
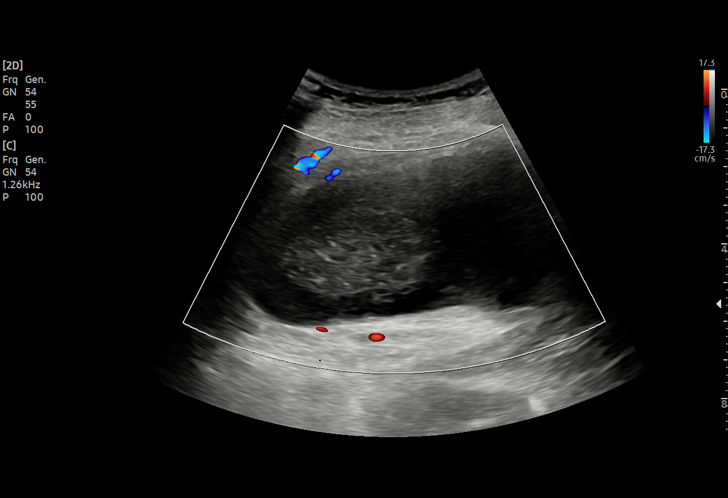
[im 20/34]
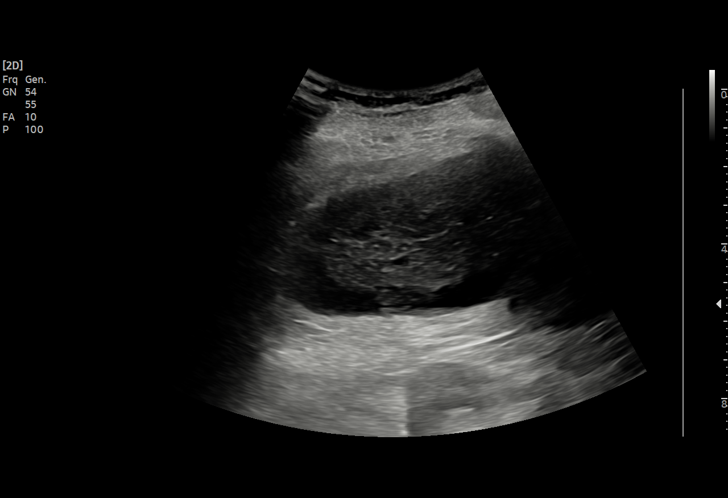
[im 21/34]
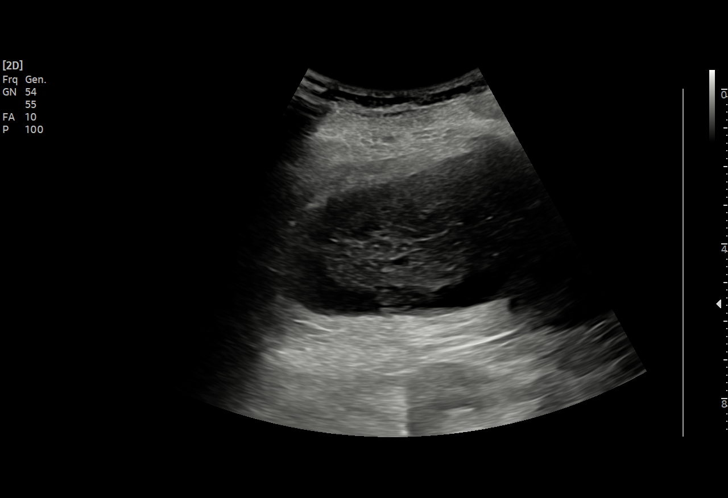
[im 24/34]
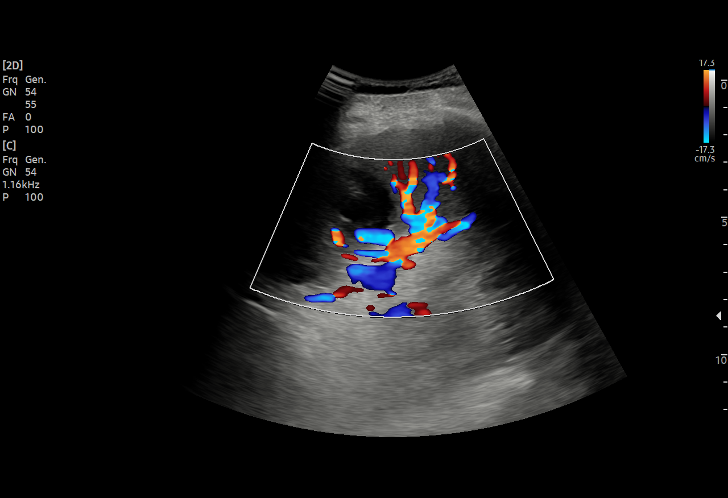
[im 27/34]
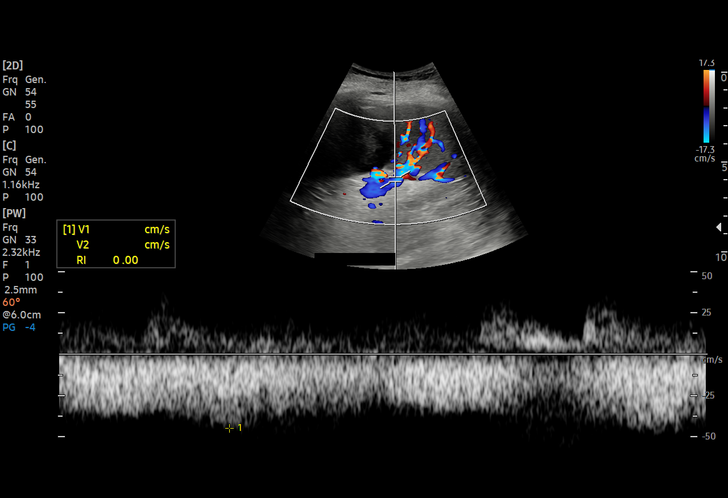
[im 28/34]
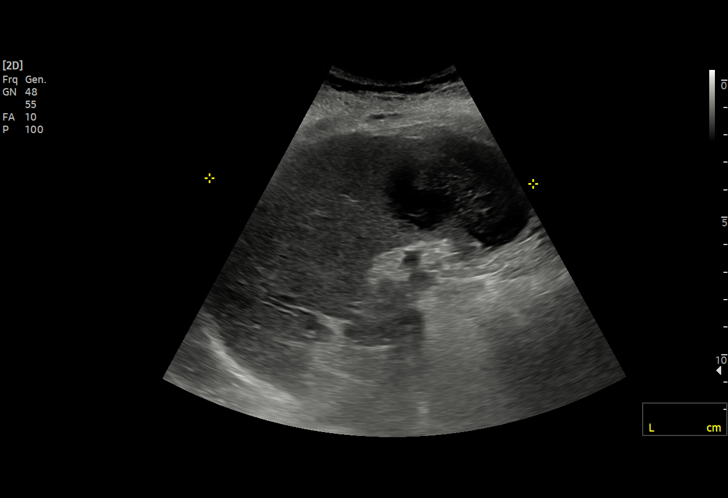
[im 31/34]
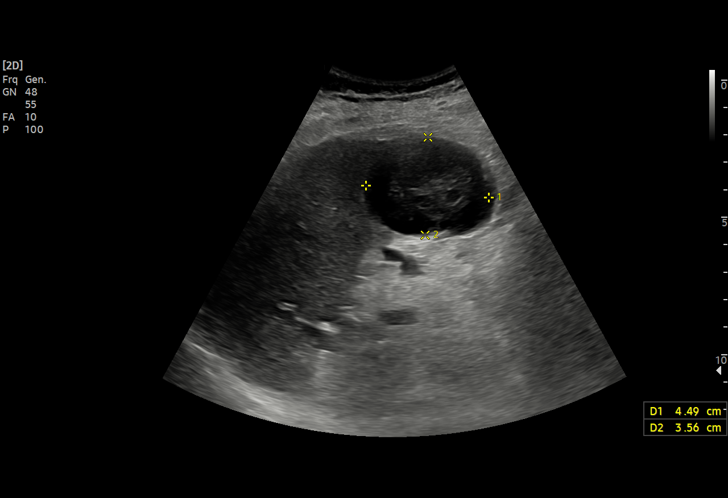
[im 34/34]
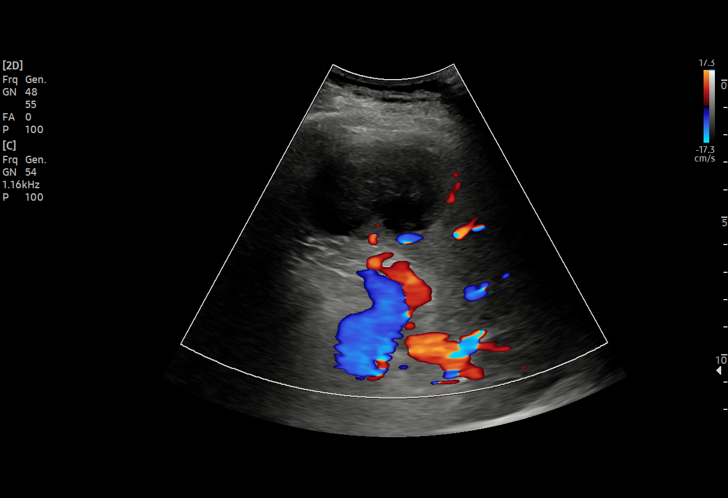

[15 of 25 positions shown; findings below may reference images not displayed]

FINDINGS: Spleen measures 11.8 x 5.3 x 12.7 cm. Calculated splenic volume is
397 mL. Superior and mid portions of the spleen have normal
echotexture. Along the inferior aspect of the spleen there is fluid
surrounding a solid structure which has similar echogenicity to the
spleen. Suspect this is fluid collection around a small area of
abnormal splenic tissue or fluid surrounding a splenic hematoma.
These findings correspond with the recent CT findings. This area of
concern with surrounding fluid measures 4.1 x 4.0 x 4.5 cm. Arterial
and venous flow at the splenic hilum. No significant internal
vascularity within the tissue surrounded by fluid.
IMPRESSION: Complex fluid collection along the inferior aspect of spleen
corresponding with the abnormality on the recent CT. The echogenic
material surrounded by fluid could represent abnormal splenic tissue
versus hematoma. The fluid collection and CT findings are more
suggestive for a hematoma due to a splenic laceration rather than an
infarct or abscess. Consider a follow-up ultrasound in 6-8 weeks to
see if the fluid collection resolves.

## 2021-03-14 MED ORDER — POLYETHYLENE GLYCOL 3350 17 G PO PACK
17.0000 g | PACK | Freq: Two times a day (BID) | ORAL | Status: DC
  Administered 2021-03-14 – 2021-03-17 (×5): 17 g via ORAL
  Filled 2021-03-14 (×5): qty 1

## 2021-03-14 MED ORDER — WARFARIN SODIUM 3 MG PO TABS
3.0000 mg | ORAL_TABLET | Freq: Once | ORAL | Status: AC
  Administered 2021-03-14: 19:00:00 3 mg via ORAL
  Filled 2021-03-14: qty 1

## 2021-03-14 MED ORDER — SENNOSIDES-DOCUSATE SODIUM 8.6-50 MG PO TABS
2.0000 | ORAL_TABLET | Freq: Two times a day (BID) | ORAL | Status: DC
  Administered 2021-03-14 – 2021-03-17 (×6): 2 via ORAL
  Filled 2021-03-14 (×6): qty 2

## 2021-03-14 MED ORDER — FLEET ENEMA 7-19 GM/118ML RE ENEM: 1.0000 | ENEMA | Freq: Every day | RECTAL | Status: DC | PRN

## 2021-03-14 NOTE — TOC Progression Note (Signed)
Transition of Care [facility]) - Progression Note    Patient Details  Name: [name] MRN: [id number] Date of Birth: [birth year]  Transition of Care [facility]) CM/SW Contact  Candie Chroman, LCSW Phone Number: 03/14/2021, 1:16 PM  Clinical Narrative: Left voicemail for [facility] admissions coordinator to see if they had reviewed referral yet.    Expected Discharge Plan: [place] Barriers to Discharge: Continued Medical Work up  Expected Discharge Plan and Services Expected Discharge Plan: [place] arrangements for the past 2 months: [place]                                       Social Determinants of Health (SDOH) Interventions    Readmission Risk Interventions No flowsheet data found.

## 2021-03-14 NOTE — Consult Note (Addendum)
[facility]em for lovenox + warfarin  Indication: chest pain/ACS, embolic stroke, and antiphospholipid antibody  Allergies  Allergen Reactions  . Penicillin G Rash    Patient Measurements: Height: 5\' 8"  (172.7 cm) Weight: 83.5 kg (184 lb) IBW/kg (Calculated) : 63.9 Heparin Dosing Weight: 81 kg  Vital Signs: Temp: 98.3 F (36.8 C) (04/12 1244) Temp Source: Oral (04/12 0515) BP: 150/94 (04/12 1244) Pulse Rate: 102 (04/12 1244)  Labs: Recent Labs    03/11/21 1323 03/11/21 1507 03/11/21 2332 03/12/21 0553 03/12/21 1044 03/12/21 1852 03/13/21 0402 03/14/21 0457  HGB  --   --    < > 7.6*  --   --  7.6* 7.3*  HCT  --   --   --  24.0*  --   --  23.4* 22.0*  PLT  --   --   --  80*  --   --  78* 94*  APTT  --  150*   < >  --  >200* >160* 133*  --   LABPROT  --  18.4*  --   --   --   --  16.9* 20.4*  INR  --  1.6*  --   --   --   --  1.4* 1.8*  HEPARINUNFRC  --  3.38*  --   --  1.67*  --  1.24*  --   CREATININE  --   --   --  1.80*  --   --  1.77* 1.84*  TROPONINIHS 543* 564*  --   --   --   --   --   --    < > = values in this interval not displayed.    Estimated Creatinine Clearance: 32.7 mL/min (A) (by C-G formula based on SCr of 1.84 mg/dL (H)).   Medical History: Past Medical History:  Diagnosis Date  . Arthritis 04/02/1996  . Depression   . GERD (gastroesophageal reflux disease) 04/03/2019  . Hyperlipidemia   . Hypertension   . Renal insufficiency   . Stroke ([place])   . Thyroid disease     Medications:  Medications Prior to Admission  Medication Sig Dispense Refill Last Dose  . acetaminophen (TYLENOL) 325 MG tablet Take 2 tablets (650 mg total) by mouth every 4 (four) hours as needed for mild pain (or temp > 37.5 C (99.5 F)).   Unknown at PRN  . apixaban (ELIQUIS) 5 MG TABS tablet Take 1 tablet (5 mg total) by mouth 2 (two) times daily. 60 tablet  03/10/2021 at 1800  . ascorbic acid (VITAMIN C) 500 MG tablet Take 500 mg by  mouth 2 (two) times daily.     Marland Kitchen atorvastatin (LIPITOR) 20 MG tablet Take 1 tablet (20 mg total) by mouth daily. (Patient taking differently: Take 20 mg by mouth every evening.)   03/10/2021 at 1800  . escitalopram (LEXAPRO) 20 MG tablet Take 1 tablet (20 mg total) by mouth daily. 90 tablet 0 03/10/2021 at 0900  . famotidine (PEPCID) 20 MG tablet Take 20 mg by mouth daily.     . ferrous sulfate 325 (65 FE) MG EC tablet Take 1 tablet (325 mg total) by mouth daily with breakfast. (Patient taking differently: Take 325 mg by mouth at bedtime.) 30 tablet 3   . levothyroxine (SYNTHROID) 100 MCG tablet Take 1 tablet (100 mcg total) by mouth daily at 6 (six) AM.   03/10/2021 at 0600  . loperamide (IMODIUM) 2 MG capsule Take 1 capsule (  2 mg total) by mouth every 6 (six) hours as needed for diarrhea or loose stools. 30 capsule 0 Unknown at PRN  . montelukast (SINGULAIR) 10 MG tablet TAKE 1 TABLET BY MOUTH EVERYDAY AT BEDTIME (Patient taking differently: Take 10 mg by mouth at bedtime. TAKE 1 TABLET BY MOUTH EVERYDAY AT BEDTIME) 90 tablet 3 03/10/2021 at 2000  . ondansetron (ZOFRAN-ODT) 4 MG disintegrating tablet Take 4 mg by mouth every 6 (six) hours as needed for nausea or vomiting.   Unknown at PRN  . oxybutynin (DITROPAN XL) 15 MG 24 hr tablet Take 1 tablet (15 mg total) by mouth at bedtime.   03/10/2021 at 2000  . sodium chloride (OCEAN) 0.65 % SOLN nasal spray Place 2 sprays into both nostrils every 6 (six) hours.     . traMADol (ULTRAM) 50 MG tablet Take 50 mg by mouth every 6 (six) hours.     . traZODone (DESYREL) 100 MG tablet Take 1 tablet (100 mg total) by mouth at bedtime.   03/10/2021 at 2000  . vitamin B-12 (CYANOCOBALAMIN) 1000 MCG tablet Take 1,000 mcg by mouth daily.     Marland Kitchen buPROPion (WELLBUTRIN XL) 150 MG 24 hr tablet Take 1 tablet (150 mg total) by mouth daily. (Patient not taking: No sig reported) 30 tablet 0 Not Taking at Unknown time  . diclofenac Sodium (VOLTAREN) 1 % GEL Apply 2 g topically 4 (four)  times daily as needed (left knee pain). (Patient not taking: No sig reported)   Not Taking at Unknown time  . neomycin-bacitracin-polymyxin (NEOSPORIN) OINT Apply 1 application topically 2 (two) times daily. (Patient not taking: No sig reported)   Not Taking at Unknown time   Scheduled:  . atorvastatin  20 mg Oral QHS  . buPROPion  150 mg Oral Daily  . enoxaparin (LOVENOX) injection  1 mg/kg Subcutaneous Q12H  . escitalopram  20 mg Oral Daily  . famotidine  20 mg Oral Daily  . feeding supplement  237 mL Oral TID BM  . ferrous sulfate  325 mg Oral QHS  . levothyroxine  100 mcg Oral Q0600  . mouth rinse  15 mL Mouth Rinse BID  . melatonin  5 mg Oral QHS  . montelukast  10 mg Oral QHS  . multivitamin with minerals   Oral Daily  . oxybutynin  15 mg Oral QHS  . polyethylene glycol  17 g Oral BID  . senna-docusate  2 tablet Oral BID  . vitamin B-12  1,000 mcg Oral Daily  . Warfarin - Pharmacist Dosing Inpatient   Does not apply q1600   Infusions:   PRN: haloperidol, ondansetron **OR** ondansetron (ZOFRAN) IV, oxyCODONE, sodium phosphate, traZODone Anti-infectives (From admission, onward)   Start     Dose/Rate Route Frequency Ordered Stop   03/12/21 1000  cefTRIAXone (ROCEPHIN) 1 g in sodium chloride 0.9 % 100 mL IVPB  Status:  Discontinued        1 g 200 mL/hr over 30 Minutes Intravenous Every 24 hours 03/11/21 2223 03/13/21 1402   03/11/21 2315  azithromycin (ZITHROMAX) 500 mg in sodium chloride 0.9 % 250 mL IVPB  Status:  Discontinued        500 mg 250 mL/hr over 60 Minutes Intravenous Every 24 hours 03/11/21 2223 03/12/21 0842   03/11/21 1100  cefTRIAXone (ROCEPHIN) 1 g in sodium chloride 0.9 % 100 mL IVPB        1 g 200 mL/hr over 30 Minutes Intravenous  Once 03/11/21 1056 03/11/21 1131  Assessment: Pharmacy consulted for ACS. Trop elevated at 543.  She was on Coumadin prior to her stroke, and while she was inpatient in [facility] acute rehab her consulting physician  switched her over to Eliquis. Now, medical team is switching patient back to warfarin. Patient was on prescribed warfarin prior to apixaban for antiphospholipid antibody syndrome. Prior notes state goal INR was 2.5 to 3.5. Per  Dr. Ubaldo Glassing, recommended to have goal INR on the lower end of 2 to 3, so will aim for this while inpatient. DDI: APAP (increase bleeding), escitalopram (increase bleeding), hypothroidism (decreases effect of warfarin). NO major DDIs.   Home regimen in January 2022 was warfarin 2mg : Tu,Th, Sa,Su Warfarin 3mg : M,W,F.   Date INR Warfarin Dose  4/9 1.6   4/10 -- 2 mg  4/11 1.4 4 mg  4/12 1.8 3 mg    Goal of Therapy:  INR: 2 - 3.  Monitor platelets by anticoagulation protocol: Yes    Plan:  Enoxaparin:  Ordered enoxaparin 82.5mg  Q12 hours(1mg /kg BID dosing ordered) to help bridge until INR is therapeutic. Hgb gradually decreasing. Platelets typically below 100. Will continue to monitor.  Warfarin:   INR is subtherapeutic but trending upward towards goal. Will give warfarin 3 mg x 1 tonight. Daily INR ordered.   Pearla Dubonnet, PharmD Clinical Pharmacist 03/14/2021 1:10 PM

## 2021-03-14 NOTE — Progress Notes (Signed)
PROGRESS NOTE    Shawnna Pancake [facility]  KWI:[id number] DOB: 11/23/1951 DOA: 03/11/2021 PCP: Juline Patch, MD    Brief Narrative:  70 year old female with history of hypertension, hypothyroidism, history of DVT and antiphospholipid syndrome who was previously on Coumadin for 15 years and recently changed to Eliquis, chronic kidney disease stage IIIb with baseline creatinine 1.8, hyperlipidemia, depression, cognitive decline, recent history of stroke and rehab, recent right ankle fracture brought back from nursing home with altered mental status.  Recent hospitalization for stroke, rehab and discharged to the nursing home.  She has been remaining in poor health since then, appetite is poor.  Patient was complaining of midsternal chest pain for about 2 days.  Both spouse at long-term nursing home now. In the emergency room, patient is confused.  Hemodynamically stable.  Sodium 132.  Creatinine 2.03 mildly elevated from baseline otherwise most of the findings negative.  COVID-19 negative.  UA consistent with large leukocytes.  CT head with multiple previous infarctions but no acute findings. Patient was ultimately found to have cardioembolic stroke.  4/12-hg 7.2 .  C/o LQ pain  Assessment & Plan:   Active Problems:   Adult hypothyroidism   Essential hypertension   Anticoagulant long-term use   Antiphospholipid antibody syndrome (HCC)   Stage 3 chronic kidney disease (HCC)   Thrombocytopenia (HCC)   CVA (cerebral vascular accident) ([place])   Ischemic cerebrovascular accident (CVA) of frontal lobe (HCC)   Embolism of left anterior cerebral artery   AKI (acute kidney injury) ([place])   Pressure injury of skin   Encephalopathy  Acute metabolic encephalopathy in a patient with multiple medical issues: Developing vascular dementia and cognition deficits. Currently hemodynamically stable.  Neurologically stable.  Found to have acute a stroke, see below. Minimize benzodiazepines and opiates.  Patient  has inconsistent complaints and easily reoriented. Delirium precautions.  Sleep-wake cycle regulation. 4/12-improved today. Continue to monitor  Suspected UTI: Patient with recurrent UTI.  Urinalysis abnormal.  Previous Citrobacter.  Procalcitonin very high with no other explanation of infection. 4/12-was given rocephin. ucx with 20,000 diptheroids only.  Rocephin was d/c'd    Elevated troponin- Echo with EF of 50 to 55%.  Grade 2 diastolic dysfunction.  No regional wall motion abnormality.  Cardiology following.  Not a cardiac for invasive cardiac evaluation due to multiple comorbidities including recent CVA, ARI.  Will recommend conservative management.   Acute kidney injury on chronic kidney disease stage IIIb: Patient was treated with IV fluids.  Renal functions improved.  Her baseline creatinine reported about 1.8.  4/12-remains stable. ivf was d/c'd  Continue to monitor  Antiphospholipid antibody syndrome/hypercoagulable condition/recurrent cardioembolic stroke: Repeat MRI showed multiple cardioembolic showering embolism.  No new neurological deficit. Diagnosed antiphospholipid antibody syndrome, changed to Eliquis 3 months ago.  Will treat as failure of Eliquis as she was consistently taking this at nursing home. Patient on heparin, changed to Lovenox today.  Coumadin started 4/10, long-term management should be with Coumadin.  Patient is going to be in a supervised living condition now, will be able to keep up on Coumadin. Sinus rhythm and rate controlled. Neurology following, PT/OT/speech.  Referred to a skilled nursing rehab. 4/12-may need monitor as outpt for evaluation of possible paf.  inr 1.8 with lovenox bridge. Continue for goal 2-3. Will need warfarin indefinitely and bridged if she needs to come off for procedures in the future   Right ankle fracture: Conservative management.  Nonweightbearing on cast.  Hypothyroidism: On Synthroid that we will continue.  TSH  normal.  Anemia of chronic disease: Hemoglobin trending down.  Iron and ferritin level were normal.  B12 level is adequate and on replacement.   May need screening exam including EGD and colonoscopy, however with acute embolic stroke may postpone the procedure. Hematology f/u 4/12-hg 7.3 Ck stool occult. C/o LQ pain- abd US done today, with possible splenic hematoma. D/w surgery via chat for consult, recommended, vascular .  Consulted vascular surgery  DVT prophylaxis: Place TED hose Start: 03/11/21 1309, Lovenox and Coumadin.   Code Status: DNR Family Communication: Patient's daughter at the bedside Disposition Plan: Status is: Inpatient  Remains inpatient appropriate because:Altered mental status, IV treatments appropriate due to intensity of illness or inability to take PO and Inpatient level of care appropriate due to severity of illness   Dispo: The patient is from: SNF              Anticipated d/c is to: SNF              Patient currently is not medically stable to d/c.   Difficult to place patient No  Consultants:   Cardiology  Neurology  Hematology  Procedures:   None  Antimicrobials:   Rocephin 4/9---4/11   Subjective: C/o LQ pain. No n/v  Objective: Vitals:   03/13/21 2029 03/14/21 0515 03/14/21 0754 03/14/21 1244  BP: (!) 143/79 129/76 (!) 147/76 (!) 150/94  Pulse: (!) 101 86 (!) 101 (!) 102  Resp:  18 16 16   Temp: 98.2 F (36.8 C) 97.6 F (36.4 C) 98.1 F (36.7 C) 98.3 F (36.8 C)  TempSrc:  Oral    SpO2: 99% 99% 100% 100%  Weight:      Height:        Intake/Output Summary (Last 24 hours) at 03/14/2021 1424 Last data filed at 03/14/2021 0515 Gross per 24 hour  Intake 100 ml  Output 350 ml  Net -250 ml   Filed Weights   03/11/21 0921  Weight: 83.5 kg    Examination: Calm lying in bed, daughter at bedside CTA no wheeze rales rhonchi's Regular S1-S2 no gallops Soft benign positive bowel sounds nontender No edema Awake alert  oriented x3 grossly intact Mood and affect appropriate in current setting    Data Reviewed: I have personally reviewed following labs and imaging studies  CBC: Recent Labs  Lab 03/11/21 0926 03/12/21 0553 03/13/21 0402 03/14/21 0457  WBC 8.5 7.3 9.5 8.5  NEUTROABS 6.3  --  7.3 5.7  HGB 8.6* 7.6* 7.6* 7.3*  HCT 26.4* 24.0* 23.4* 22.0*  MCV 94.6 96.8 95.1 95.2  PLT 104* 80* 78* 94*   Basic Metabolic Panel: Recent Labs  Lab 03/11/21 0926 03/12/21 0553 03/13/21 0402 03/14/21 0457  NA 132* 137 136 136  K 3.9 4.1 4.0 3.7  CL 101 103 104 104  CO2 25 24 24 25   GLUCOSE 112* 88 109* 98  BUN 35* 31* 26* 21  CREATININE 2.03* 1.80* 1.77* 1.84*  CALCIUM 10.0 9.5 9.7 9.4  MG  --   --  1.8  --   PHOS  --   --  3.5  --    GFR: Estimated Creatinine Clearance: 32.7 mL/min (A) (by C-G formula based on SCr of 1.84 mg/dL (H)). Liver Function Tests: Recent Labs  Lab 03/11/21 0926  AST 46*  ALT 44  ALKPHOS 262*  BILITOT 0.9  PROT 7.2  ALBUMIN 2.7*   No results for input(s): LIPASE, AMYLASE in the last 168 hours. Recent Labs  Lab 03/11/21 1323  AMMONIA <9*   Coagulation Profile: Recent Labs  Lab 03/11/21 1507 03/13/21 0402 03/14/21 0457  INR 1.6* 1.4* 1.8*   Cardiac Enzymes: No results for input(s): CKTOTAL, CKMB, CKMBINDEX, TROPONINI in the last 168 hours. BNP (last 3 results) No results for input(s): PROBNP in the last 8760 hours. HbA1C: Recent Labs    03/14/21 0457  HGBA1C 5.1   CBG: Recent Labs  Lab 03/12/21 1347  GLUCAP 89   Lipid Profile: Recent Labs    03/14/21 0457  CHOL 111  HDL 38*  LDLCALC 53  TRIG 99  CHOLHDL 2.9   Thyroid Function Tests: Recent Labs    03/12/21 1216  TSH 4.896*   Anemia Panel: Recent Labs    03/12/21 1216  VITAMINB12 4,012*  FERRITIN 744*  TIBC 245*  IRON 39   Sepsis Labs: Recent Labs  Lab 03/11/21 1323 03/12/21 0553 03/13/21 0402  PROCALCITON 59.38 46.36 26.80    Recent Results (from the past 240  hour(s))  Urine Culture     Status: Abnormal   Collection Time: 03/11/21  9:26 AM   Specimen: Urine, Random  Result Value Ref Range Status   Specimen Description   Final    URINE, RANDOM Performed at [facility], [address] [place] [address], [place], [place] [address]    Special Requests   Final    NONE Performed at [facility], [address] [place], [place] [address]    Culture (A)  Final    20,000 COLONIES/mL DIPHTHEROIDS(CORYNEBACTERIUM SPECIES) Standardized susceptibility testing for this organism is not available. Performed at [facility], [place] [address] [place], [place] [address]    Report Status 03/12/2021 FINAL  Final  SARS CORONAVIRUS 2 (TAT 6-24 HRS) Nasopharyngeal Nasopharyngeal Swab     Status: None   Collection Time: 03/11/21 11:05 AM   Specimen: Nasopharyngeal Swab  Result Value Ref Range Status   SARS Coronavirus 2 NEGATIVE NEGATIVE Final    Comment: (NOTE) SARS-CoV-2 target nucleic acids are NOT DETECTED.  The SARS-CoV-2 RNA is generally detectable in upper and lower respiratory specimens during the acute phase of infection. Negative results do not preclude SARS-CoV-2 infection, do not rule out co-infections with other pathogens, and should not be used as the sole basis for treatment or other patient management decisions. Negative results must be combined with clinical observations, patient history, and epidemiological information. The expected result is Negative.  Fact Sheet for Patients: SugarRoll.be  Fact Sheet for Healthcare Providers: https://www.woods-mathews.com/  This test is not yet approved or cleared by the [place] FDA and  has been authorized for detection and/or diagnosis of SARS-CoV-2 by FDA under an Emergency Use Authorization (EUA). This EUA will remain  in effect (meaning this test can be used) for the duration of the COVID-19 declaration under Se ction 564(b)(1) of the  Act, 21 U.S.C. section 360bbb-3(b)(1), unless the authorization is terminated or revoked sooner.  Performed at [facility], [place] [address], [place],  [address]          Radiology Studies: ECHOCARDIOGRAM COMPLETE  Result Date: 03/14/2021    ECHOCARDIOGRAM REPORT   Patient Name:   IRIDIAN READER Date of Exam: 03/14/2021 Medical Rec #:  [id number]          Height:       68.0 in Accession #:    [id number]         Weight:       184.0 lb Date of Birth:  February 20, 1951         BSA:          1.973 m Patient Age:    70 years           BP:           147/76 mmHg Patient Gender: F                  HR:           101 bpm. Exam Location:  [facility] Procedure: 2D Echo, Cardiac Doppler and Color Doppler Indications:     Stroke I63.9  History:         Patient has prior history of Echocardiogram examinations, most                  recent 12/27/2020. Stroke; Risk Factors:Hypertension.  Sonographer:     Sherrie Sport RDCS (AE) Referring Phys:  [id number] Barb Merino Diagnosing Phys: Isaias Cowman MD IMPRESSIONS  1. Left ventricular ejection fraction, by estimation, is 50 to 55%. The left ventricle has low normal function. The left ventricle has no regional wall motion abnormalities. Left ventricular diastolic parameters are consistent with Grade II diastolic dysfunction (pseudonormalization).  2. Right ventricular systolic function is normal. The right ventricular size is normal.  3. The mitral valve is normal in structure. Mild to moderate mitral valve regurgitation. No evidence of mitral stenosis.  4. The aortic valve is normal in structure. Aortic valve regurgitation is mild. No aortic stenosis is present.  5. The inferior vena cava is normal in size with greater than 50% respiratory variability, suggesting right atrial pressure of 3 mmHg. FINDINGS  Left Ventricle: Left ventricular ejection fraction, by estimation, is 50 to 55%. The left ventricle has low normal function. The left ventricle has no regional  wall motion abnormalities. The left ventricular internal cavity size was normal in size. There is no left ventricular hypertrophy. Left ventricular diastolic parameters are consistent with Grade II diastolic dysfunction (pseudonormalization). Right Ventricle: The right ventricular size is normal. No increase in right ventricular wall thickness. Right ventricular systolic function is normal. Left Atrium: Left atrial size was normal in size. Right Atrium: Right atrial size was normal in size. Pericardium: There is no evidence of pericardial effusion. Mitral Valve: The mitral valve is normal in structure. Mild to moderate mitral valve regurgitation. No evidence of mitral valve stenosis. Tricuspid Valve: The tricuspid valve is normal in structure. Tricuspid valve regurgitation is mild . No evidence of tricuspid stenosis. Aortic Valve: The aortic valve is normal in structure. Aortic valve regurgitation is mild. No aortic stenosis is present. Aortic valve mean gradient measures 6.0 mmHg. Aortic valve peak gradient measures 10.0 mmHg. Aortic valve area, by VTI measures 1.78  cm. Pulmonic Valve: The pulmonic valve was normal in structure. Pulmonic valve regurgitation is not visualized. No evidence of pulmonic stenosis. Aorta: The aortic root is normal in size and structure. Venous: The inferior vena cava is normal in size with greater than 50% respiratory variability, suggesting right atrial pressure of 3 mmHg. IAS/Shunts: No atrial level shunt detected by color flow Doppler.  LEFT VENTRICLE PLAX 2D LVIDd:         4.40 cm  Diastology LVIDs:         3.26 cm  LV e' medial:    6.64 cm/s LV PW:         0.98 cm  LV E/e' medial:  10.3 LV IVS:        0.93 cm  LV e' lateral:   5.00 cm/s LVOT diam:     2.00 cm  LV E/e' lateral: 13.7 LV SV:         60 LV SV Index:   30 LVOT Area:     3.14 cm  RIGHT VENTRICLE RV Basal diam:  2.97 cm RV S prime:     9.57 cm/s TAPSE (M-mode): 3.1 cm LEFT ATRIUM             Index       RIGHT ATRIUM            Index LA diam:        4.00 cm 2.03 cm/m  RA Area:     13.90 cm LA Vol (A2C):   60.5 ml 30.67 ml/m RA Volume:   31.30 ml  15.87 ml/m LA Vol (A4C):   62.6 ml 31.73 ml/m LA Biplane Vol: 66.5 ml 33.71 ml/m  AORTIC VALVE                    PULMONIC VALVE AV Area (Vmax):    1.41 cm     PV Vmax:        0.75 m/s AV Area (Vmean):   1.29 cm     PV Peak grad:   2.2 mmHg AV Area (VTI):     1.78 cm     RVOT Peak grad: 2 mmHg AV Vmax:           158.33 cm/s AV Vmean:          116.000 cm/s AV VTI:            0.337 m AV Peak Grad:      10.0 mmHg AV Mean Grad:      6.0 mmHg LVOT Vmax:         71.10 cm/s LVOT Vmean:        47.600 cm/s LVOT VTI:          0.191 m LVOT/AV VTI ratio: 0.57  AORTA Ao Root diam: 2.83 cm MITRAL VALVE                TRICUSPID VALVE MV Area (PHT): 3.60 cm     TR Peak grad:   33.2 mmHg MV Decel Time: 211 msec     TR Vmax:        288.00 cm/s MV E velocity: 68.60 cm/s MV A velocity: 117.00 cm/s  SHUNTS MV E/A ratio:  0.59         Systemic VTI:  0.19 m                             Systemic Diam: 2.00 cm Isaias Cowman MD Electronically signed by Isaias Cowman MD Signature Date/Time: 03/14/2021/1:55:06 PM    Final    US ABDOMINAL PELVIC ART/VENT FLOW DOPPLER LIMITED  Result Date: 03/14/2021 CLINICAL DATA:  Evaluate splenic abnormality on recent CT. EXAM: ULTRASOUND ABDOMEN LIMITED WITH DOPPLER TECHNIQUE: Images were obtained at the area of concern at the spleen. COMPARISON:  CT of the abdomen and pelvis 03/11/2021 FINDINGS: Spleen measures 11.8 x 5.3 x 12.7 cm. Calculated splenic volume is 397 mL. Superior and mid portions of the spleen have normal echotexture. Along the inferior aspect of the spleen there is fluid surrounding a solid structure which has similar echogenicity to the spleen. Suspect this is fluid collection around a small area of abnormal splenic tissue or fluid surrounding a splenic hematoma. These  findings correspond with the recent CT findings. This area of concern with  surrounding fluid measures 4.1 x 4.0 x 4.5 cm. Arterial and venous flow at the splenic hilum. No significant internal vascularity within the tissue surrounded by fluid. IMPRESSION: Complex fluid collection along the inferior aspect of spleen corresponding with the abnormality on the recent CT. The echogenic material surrounded by fluid could represent abnormal splenic tissue versus hematoma. The fluid collection and CT findings are more suggestive for a hematoma due to a splenic laceration rather than an infarct or abscess. Consider a follow-up ultrasound in 6-8 weeks to see if the fluid collection resolves. Electronically Signed   By: Markus Daft M.D.   On: 03/14/2021 13:23   US SPLEEN (ABDOMEN LIMITED)  Result Date: 03/14/2021 CLINICAL DATA:  Evaluate splenic abnormality on recent CT. EXAM: ULTRASOUND ABDOMEN LIMITED WITH DOPPLER TECHNIQUE: Images were obtained at the area of concern at the spleen. COMPARISON:  CT of the abdomen and pelvis 03/11/2021 FINDINGS: Spleen measures 11.8 x 5.3 x 12.7 cm. Calculated splenic volume is 397 mL. Superior and mid portions of the spleen have normal echotexture. Along the inferior aspect of the spleen there is fluid surrounding a solid structure which has similar echogenicity to the spleen. Suspect this is fluid collection around a small area of abnormal splenic tissue or fluid surrounding a splenic hematoma. These findings correspond with the recent CT findings. This area of concern with surrounding fluid measures 4.1 x 4.0 x 4.5 cm. Arterial and venous flow at the splenic hilum. No significant internal vascularity within the tissue surrounded by fluid. IMPRESSION: Complex fluid collection along the inferior aspect of spleen corresponding with the abnormality on the recent CT. The echogenic material surrounded by fluid could represent abnormal splenic tissue versus hematoma. The fluid collection and CT findings are more suggestive for a hematoma due to a splenic laceration  rather than an infarct or abscess. Consider a follow-up ultrasound in 6-8 weeks to see if the fluid collection resolves. Electronically Signed   By: Markus Daft M.D.   On: 03/14/2021 13:23        Scheduled Meds: . atorvastatin  20 mg Oral QHS  . buPROPion  150 mg Oral Daily  . enoxaparin (LOVENOX) injection  1 mg/kg Subcutaneous Q12H  . escitalopram  20 mg Oral Daily  . famotidine  20 mg Oral Daily  . feeding supplement  237 mL Oral TID BM  . ferrous sulfate  325 mg Oral QHS  . levothyroxine  100 mcg Oral Q0600  . mouth rinse  15 mL Mouth Rinse BID  . melatonin  5 mg Oral QHS  . montelukast  10 mg Oral QHS  . multivitamin with minerals   Oral Daily  . oxybutynin  15 mg Oral QHS  . polyethylene glycol  17 g Oral BID  . senna-docusate  2 tablet Oral BID  . vitamin B-12  1,000 mcg Oral Daily  . warfarin  3 mg Oral ONCE-1600  . Warfarin - Pharmacist Dosing Inpatient   Does not apply q1600   Continuous Infusions:    LOS: 2 days    Time spent: 35 minutes with > 50% on coc   Nolberto Hanlon, MD [facility] Pager [phone]

## 2021-03-14 NOTE — Consult Note (Addendum)
Hematology/Oncology Consult note [facility] Telephone:([phone] [phone]  Patient Care Team: Juline Patch, MD as PCP - General (Family Medicine)   Name of the patient: [name]  [id number]  Jun 27, 1951    Reason for consult: anemia   Requesting physician Dr. Sloan Leiter  Date of visit: 03/14/2021    History of presenting illness-patient is a 70 year old female who was diagnosed with antiphospholipid antibody syndromeAt [facility] back in 2004.  At that time she had presented with symptoms of stroke.  I do not have APS levels back from 2004 but repeat levels done in 2020 showed persistently elevated IgG beta-2 glycoprotein and IgG anticardiolipin antibody with titers greater than 40.  She was on Coumadin since then up until January 2022 when she was again diagnosed with multiple strokes.  There was a concern prior to that the patient was not checking her INR consistently and may not have been therapeutic.  She was switched to Eliquis and discharged to rehab from [facility] in February 2022.  She now comes to [facility] with symptoms of confusion and was again noted to have shower of embolic pattern acute infarcts involving the infra and supratentorial brain with extensive chronic ischemic injury.  Presently hematology has been consulted for her anemia.  A year ago patient's hemoglobin was around 11.  Since the beginning of this year her hemoglobin is gradually drifted down from 10-7.3 presently.  Also since January 2022 she has had new onset thrombocytopenia and her platelet counts have been fluctuating between 60s 200s.  A year ago patient had a normal platelet count in the 200s.  Patient has had chronic kidney disease at least dating back to 2018 and her CKD seems no worse now than what it was 3 years ago.  Patient had iron studies checked during this hospitalization which showed an elevated ferritin of 744.  Iron studies showed a low TIBC of 245.  B12 levels  were elevated at 4012.  TSH mildly abnormal at 4.8.  During this hospitalization there have been no concerns of active GI bleeding noted overtly.  Patient's mental status has been fluctuating and today patient's daughter reports that her mental status is actually improved as compared to yesterday.  ECOG PS- 3  Pain scale- 0   Review of systems- Review of Systems  Constitutional: Positive for malaise/fatigue. Negative for chills, fever and weight loss.  HENT: Negative for congestion, ear discharge and nosebleeds.   Eyes: Negative for blurred vision.  Respiratory: Negative for cough, hemoptysis, sputum production, shortness of breath and wheezing.   Cardiovascular: Negative for chest pain, palpitations, orthopnea and claudication.  Gastrointestinal: Positive for constipation. Negative for abdominal pain, blood in stool, diarrhea, heartburn, melena, nausea and vomiting.  Genitourinary: Negative for dysuria, flank pain, frequency, hematuria and urgency.  Musculoskeletal: Negative for back pain, joint pain and myalgias.  Skin: Negative for rash.  Neurological: Negative for dizziness, tingling, focal weakness, seizures, weakness and headaches.  Endo/Heme/Allergies: Does not bruise/bleed easily.  Psychiatric/Behavioral: Negative for depression and suicidal ideas. The patient does not have insomnia.     Allergies  Allergen Reactions  . Penicillin G Rash    Patient Active Problem List   Diagnosis Date Noted  . Encephalopathy 03/12/2021  . AKI (acute kidney injury) ([place]) 03/11/2021  . Pressure injury of skin 03/11/2021  . Fever   . Acute blood loss anemia   . Neurogenic bladder   . 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 Ischemic cerebrovascular accident (  CVA) of frontal lobe ([place]) 12/29/2020  . CVA (cerebral vascular accident) ([place]) 12/27/2020  . Acute CVA (cerebrovascular accident) ([place]) 12/26/2020  . Thrombocytopenia ([place]) 12/26/2020  . Antiphospholipid antibody  syndrome ([place]) 02/10/2020  . Acute kidney failure ([place]) 11/04/2019  . Edema of lower extremity 11/04/2019  . Stage 3 chronic kidney disease ([place]) 11/04/2019  . Popliteal bursitis of left knee 08/25/2019  . Familial hypercholesterolemia 08/25/2019  . Renal insufficiency 08/25/2019  . Low hemoglobin 08/25/2019  . History of essential hypertension 08/25/2019  . Seasonal allergic rhinitis due to pollen 02/24/2019  . Age-related osteoporosis without current pathological fracture 01/20/2018  . Osteoporosis 11/12/2017  . Urinary incontinence 05/28/2017  . Knee pain 05/08/2017  . Muscle weakness 05/08/2017  . Sprain of MCL (medial collateral ligament) of knee 05/08/2017  . Adult hypothyroidism 10/23/2016  . Essential hypertension 10/23/2016  . Gastroesophageal reflux disease without esophagitis 10/23/2016  . Recurrent major depressive disorder, in full remission ([place]) 10/23/2016  . Anticoagulant long-term use 10/23/2016     Past Medical History:  Diagnosis Date  . Arthritis 04/02/1996  . Depression   . GERD (gastroesophageal reflux disease) 04/03/2019  . Hyperlipidemia   . Hypertension   . Renal insufficiency   . Stroke ([place])   . Thyroid disease      Past Surgical History:  Procedure Laterality Date  . CESAREAN SECTION    . CHOLECYSTECTOMY    . COLONOSCOPY  2012   repeat in 10 yrs  . KNEE ARTHROSCOPY W/ MENISCAL REPAIR Left     Social History   Socioeconomic History  . Marital status: Married    Spouse name: Not on file  . Number of children: 1  . Years of education: Not on file  . Highest education level: Master's degree (e.g., MA, MS, MEng, MEd, MSW, MBA)  Occupational History  . Occupation: Retired  Tobacco Use  . Smoking status: Never Smoker  . Smokeless tobacco: Never Used  . Tobacco comment: none  Vaping Use  . Vaping Use: Never used  Substance and Sexual Activity  . Alcohol use: Yes    Alcohol/week: 1.0 standard drink    Types: 1 Glasses of wine per  week    Comment: occasional drink  . Drug use: No  . Sexual activity: Not Currently    Birth control/protection: Post-menopausal  Other Topics Concern  . Not on file  Social History Narrative  . Not on file   Social Determinants of Health   Financial Resource Strain: Not on file  Food Insecurity: Not on file  Transportation Needs: Not on file  Physical Activity: Not on file  Stress: Not on file  Social Connections: Not on file  Intimate Partner Violence: Not on file     Family History  Problem Relation Age of Onset  . Stroke Father   . Breast cancer Maternal Grandmother   . Kidney cancer Mother   . Cancer Mother   . Arthritis Brother      Current Facility-Administered Medications:  .  acetaminophen (TYLENOL) tablet 1,000 mg, 1,000 mg, Oral, Q6H PRN, 1,000 mg at 03/13/21 1850 **OR** acetaminophen (TYLENOL) suppository 650 mg, 650 mg, Rectal, Q6H PRN, Cox, Amy N, DO .  atorvastatin (LIPITOR) tablet 20 mg, 20 mg, Oral, QHS, Cox, Amy N, DO, 20 mg at 03/13/21 2100 .  buPROPion (WELLBUTRIN XL) 24 hr tablet 150 mg, 150 mg, Oral, Daily, Cox, Amy N, DO, 150 mg at 03/13/21 0933 .  enoxaparin (LOVENOX) injection 82.5 mg, 1 mg/kg, Subcutaneous, Q12H,  Barb Merino, MD, 82.5 mg at 03/13/21 2102 .  escitalopram (LEXAPRO) tablet 20 mg, 20 mg, Oral, Daily, Cox, Amy N, DO, 20 mg at 03/13/21 0933 .  famotidine (PEPCID) tablet 20 mg, 20 mg, Oral, Daily, Cox, Amy N, DO, 20 mg at 03/13/21 0933 .  feeding supplement (ENSURE ENLIVE / ENSURE PLUS) liquid 237 mL, 237 mL, Oral, TID BM, Barb Merino, MD, 237 mL at 03/13/21 2101 .  ferrous sulfate tablet 325 mg, 325 mg, Oral, QHS, Cox, Amy N, DO, 325 mg at 03/13/21 2100 .  haloperidol (HALDOL) tablet 2 mg, 2 mg, Oral, Q6H PRN, Barb Merino, MD, 2 mg at 03/13/21 1645 .  levothyroxine (SYNTHROID) tablet 100 mcg, 100 mcg, Oral, Q0600, Cox, Amy N, DO, 100 mcg at 03/14/21 0524 .  MEDLINE mouth rinse, 15 mL, Mouth Rinse, BID, Cox, Amy N, DO, 15 mL at  03/12/21 2032 .  melatonin tablet 5 mg, 5 mg, Oral, QHS, Cox, Amy N, DO, 5 mg at 03/13/21 2059 .  montelukast (SINGULAIR) tablet 10 mg, 10 mg, Oral, QHS, Cox, Amy N, DO, 10 mg at 03/13/21 2100 .  multivitamin with minerals tablet, , Oral, Daily, Cox, Amy N, DO, 1 tablet at 03/13/21 0933 .  ondansetron (ZOFRAN) tablet 4 mg, 4 mg, Oral, Q6H PRN **OR** ondansetron (ZOFRAN) injection 4 mg, 4 mg, Intravenous, Q6H PRN, Cox, Amy N, DO .  oxybutynin (DITROPAN-XL) 24 hr tablet 15 mg, 15 mg, Oral, QHS, Cox, Amy N, DO, 15 mg at 03/13/21 2100 .  oxyCODONE (Oxy IR/ROXICODONE) immediate release tablet 2.5 mg, 2.5 mg, Oral, Q6H PRN, Barb Merino, MD, 2.5 mg at 03/14/21 0740 .  polyethylene glycol (MIRALAX / GLYCOLAX) packet 17 g, 17 g, Oral, Daily, Ghimire, Kuber, MD, 17 g at 03/13/21 1402 .  traZODone (DESYREL) tablet 100 mg, 100 mg, Oral, QHS PRN, Cox, Amy N, DO, 100 mg at 03/13/21 2100 .  vitamin B-12 (CYANOCOBALAMIN) tablet 1,000 mcg, 1,000 mcg, Oral, Daily, Cox, Amy N, DO, 1,000 mcg at 03/13/21 0933 .  Warfarin - Pharmacist Dosing Inpatient, , Does not apply, q1600, Oswald Hillock [facility], Given at 03/12/21 1850   Physical exam:  Vitals:   03/13/21 1610 03/13/21 2029 03/14/21 0515 03/14/21 0754  BP: 136/75 (!) 143/79 129/76 (!) 147/76  Pulse: 92 (!) 101 86 (!) 101  Resp: '16  18 16  ' Temp: 98.7 F (37.1 C) 98.2 F (36.8 C) 97.6 F (36.4 C) 98.1 F (36.7 C)  TempSrc:   Oral   SpO2: 98% 99% 99% 100%  Weight:      Height:       Physical Exam Cardiovascular:     Rate and Rhythm: Normal rate and regular rhythm.     Heart sounds: Normal heart sounds.  Pulmonary:     Effort: Pulmonary effort is normal.     Breath sounds: Normal breath sounds.  Abdominal:     General: Bowel sounds are normal.     Palpations: Abdomen is soft.  Skin:    General: Skin is warm and dry.  Neurological:     General: No focal deficit present.     Mental Status: She is alert.     Comments: Oriented to self but not to  place and time        CMP Latest Ref Rng & Units 03/14/2021  Glucose 70 - 99 mg/dL 98  BUN 8 - 23 mg/dL 21  Creatinine 0.44 - 1.00 mg/dL 1.84(H)  Sodium 135 - 145 mmol/L 136  Potassium 3.5 - 5.1 mmol/L 3.7  Chloride 98 - 111 mmol/L 104  CO2 22 - 32 mmol/L 25  Calcium 8.9 - 10.3 mg/dL 9.4  Total Protein 6.5 - 8.1 g/dL -  Total Bilirubin 0.3 - 1.2 mg/dL -  Alkaline Phos 38 - 126 U/L -  AST 15 - 41 U/L -  ALT 0 - 44 U/L -   CBC Latest Ref Rng & Units 03/14/2021  WBC 4.0 - 10.5 K/uL 8.5  Hemoglobin 12.0 - 15.0 g/dL 7.3(L)  Hematocrit 36.0 - 46.0 % 22.0(L)  Platelets 150 - 400 K/uL 94(L)    '@IMAGES' @  DG Chest 2 View  Result Date: 03/11/2021 CLINICAL DATA:  Confusion EXAM: CHEST - 2 VIEW COMPARISON:  January 08, 2021 FINDINGS: Interstitium is mildly thickened. No edema or airspace opacity. Heart size and pulmonary vascularity are normal. No adenopathy. No bone lesions. Surgical clips noted in gallbladder fossa region. IMPRESSION: Interstitial thickening, likely indicative of underlying chronic bronchitis. No edema or airspace opacity. Cardiac silhouette normal. Electronically Signed   By: Lowella Grip III M.D.   On: 03/11/2021 09:56   DG Ankle Complete Right  Result Date: 02/22/2021 CLINICAL DATA:  Right ankle pain, fell EXAM: RIGHT ANKLE - COMPLETE 3+ VIEW COMPARISON:  05/23/2020 FINDINGS: Frontal, oblique, lateral views of the right ankle are obtained. There is an oblique displaced lateral malleolar fracture, with lateral subluxation of the talus relative to the tibial plafond. A minimally comminuted transverse medial malleolar fracture is also noted, with mild distraction. The minimally displaced fracture through the posterior malleolus is noted on lateral view. There is diffuse soft tissue swelling. Large inferior calcaneal spur. IMPRESSION: 1. Trimalleolar right ankle fracture, with lateral subluxation of the talus. 2. Diffuse soft tissue swelling. Electronically Signed   By:  Randa Ngo M.D.   On: 02/22/2021 20:41   CT Head Wo Contrast  Result Date: 03/11/2021 CLINICAL DATA:  Altered mental status/confusion EXAM: CT HEAD WITHOUT CONTRAST TECHNIQUE: Contiguous axial images were obtained from the base of the skull through the vertex without intravenous contrast. COMPARISON:  February 22, 2021 FINDINGS: Brain: Moderate diffuse atrophy is stable. There is no intracranial mass, hemorrhage, extra-axial fluid collection, or midline shift. Decreased attenuation is noted with encephalomalacia in the medial right occipital lobe consistent with prior infarct. Prior infarct also noted in the posterosuperior right parietal lobe, stable. A prior infarct is noted in the posterior aspect of the mid left cerebellum, stable. There is mild decreased attenuation in portions of the centra semiovale bilaterally. No acute appearing infarct is evident. Vascular: There is no hyperdense vessel. There is calcification in each carotid siphon region. Skull: The bony calvarium appears intact. Sinuses/Orbits: There is slight mucosal thickening in several ethmoid air cells. Visualized orbits appear symmetric bilaterally. Other: Mastoid air cells are clear. IMPRESSION: Stable atrophy. Prior infarcts in the medial right occipital lobe, superior posterior right parietal lobe, and posterior mid left cerebellum. Decreased attenuation in the periventricular white matter is likely due to small vessel vascular disease, although a mild degree of interstitial edema secondary to the ventricular enlargement could present in this manner. No acute infarct evident. No mass or hemorrhage. There are foci of arterial vascular calcification. There is slight mucosal thickening in several ethmoid air cells. Electronically Signed   By: Lowella Grip III M.D.   On: 03/11/2021 10:00   CT Head Wo Contrast  Result Date: 02/22/2021 CLINICAL DATA:  [place]. EXAM: CT HEAD WITHOUT CONTRAST CT CERVICAL SPINE WITHOUT CONTRAST TECHNIQUE:  Multidetector  CT imaging of the head and cervical spine was performed following the standard protocol without intravenous contrast. Multiplanar CT image reconstructions of the cervical spine were also generated. COMPARISON:  None. FINDINGS: CT HEAD FINDINGS Brain: Age advanced cerebral atrophy and associated ventriculomegaly. Evidence of multiple prior infarcts most notably in the right occipital lobe with associated encephalomalacia and ex vacuo dilatation of the occipital horn of the right lateral ventricle. I do not see any findings suggestive of an acute hemispheric infarction. No intracranial hemorrhage. No extra-axial fluid collections. Vascular: Vascular calcifications but no definite aneurysm or hyperdense vessels. Skull: No skull fracture or bone lesions. Sinuses/Orbits: The paranasal sinuses and mastoid air cells are clear. The globes are intact. Other: No scalp lesions or scalp hematoma. CT CERVICAL SPINE FINDINGS Alignment: Normal overall alignment of the cervical vertebral bodies. Skull base and vertebrae: No acute fracture. No primary bone lesion or focal pathologic process. Soft tissues and spinal canal: No prevertebral fluid or swelling. No visible canal hematoma. Disc levels: Degenerative cervical spondylosis with disc disease and facet disease most notable at C4-5 and C5-6. No large disc protrusions or significant spinal stenosis. Uncinate spurring and facet disease but no significant foraminal stenosis. Upper chest: The visualized lung apices are grossly clear. Other: No neck mass, adenopathy or hematoma. IMPRESSION: 1. Age advanced cerebral atrophy and associated ventriculomegaly. 2. Evidence of multiple prior infarcts. 3. No acute intracranial findings or skull fracture. 4. Degenerative cervical spondylosis with disc disease and facet disease but no acute cervical spine fracture. Electronically Signed   By: Marijo Sanes M.D.   On: 02/22/2021 21:52   CT Cervical Spine Wo Contrast  Result  Date: 02/22/2021 CLINICAL DATA:  [place]. EXAM: CT HEAD WITHOUT CONTRAST CT CERVICAL SPINE WITHOUT CONTRAST TECHNIQUE: Multidetector CT imaging of the head and cervical spine was performed following the standard protocol without intravenous contrast. Multiplanar CT image reconstructions of the cervical spine were also generated. COMPARISON:  None. FINDINGS: CT HEAD FINDINGS Brain: Age advanced cerebral atrophy and associated ventriculomegaly. Evidence of multiple prior infarcts most notably in the right occipital lobe with associated encephalomalacia and ex vacuo dilatation of the occipital horn of the right lateral ventricle. I do not see any findings suggestive of an acute hemispheric infarction. No intracranial hemorrhage. No extra-axial fluid collections. Vascular: Vascular calcifications but no definite aneurysm or hyperdense vessels. Skull: No skull fracture or bone lesions. Sinuses/Orbits: The paranasal sinuses and mastoid air cells are clear. The globes are intact. Other: No scalp lesions or scalp hematoma. CT CERVICAL SPINE FINDINGS Alignment: Normal overall alignment of the cervical vertebral bodies. Skull base and vertebrae: No acute fracture. No primary bone lesion or focal pathologic process. Soft tissues and spinal canal: No prevertebral fluid or swelling. No visible canal hematoma. Disc levels: Degenerative cervical spondylosis with disc disease and facet disease most notable at C4-5 and C5-6. No large disc protrusions or significant spinal stenosis. Uncinate spurring and facet disease but no significant foraminal stenosis. Upper chest: The visualized lung apices are grossly clear. Other: No neck mass, adenopathy or hematoma. IMPRESSION: 1. Age advanced cerebral atrophy and associated ventriculomegaly. 2. Evidence of multiple prior infarcts. 3. No acute intracranial findings or skull fracture. 4. Degenerative cervical spondylosis with disc disease and facet disease but no acute cervical spine fracture.  Electronically Signed   By: Marijo Sanes M.D.   On: 02/22/2021 21:52   MR BRAIN WO CONTRAST  Result Date: 03/12/2021 CLINICAL DATA:  Confusion. Recent history of stroke. Hypercoagulable. EXAM: MRI HEAD WITHOUT  CONTRAST TECHNIQUE: Multiplanar, multiecho pulse sequences of the brain and surrounding structures were obtained without intravenous contrast. COMPARISON:  12/26/2020 FINDINGS: Brain: Innumerable subcentimeter mainly cortically based acute infarcts along the bilateral cerebral convexity with predilection for the watershed regions. Similar size infarcts are seen in the bilateral cerebellum. Numerous pre-existing cerebellar and cortical infarcts with generalized brain atrophy. Large remote right occipital infarct with dense encephalomalacia. No acute hemorrhage, hydrocephalus, or mass. Vascular: Major flow voids are preserved Skull and upper cervical spine: Normal marrow signal Sinuses/Orbits: Negative IMPRESSION: 1. Shower of embolic pattern acute infarcts involving the infra and supratentorial brain. 2. Extensive chronic ischemic injury. Electronically Signed   By: Monte Fantasia M.D.   On: 03/12/2021 11:52   CT Renal Stone Study  Result Date: 03/11/2021 CLINICAL DATA:  70 year old with chronic kidney disease, presenting with a urinary tract infection. EXAM: CT ABDOMEN AND PELVIS WITHOUT CONTRAST TECHNIQUE: Multidetector CT imaging of the abdomen and pelvis was performed following the standard protocol without IV contrast. COMPARISON:  01/09/2021. FINDINGS: Lower chest: LEFT pleural effusion and associated mild passive atelectasis in th, broad-based central disc protrusion and multifactorial spinal stenosis at L4-5, and diffuse degenerative changes e LEFT LOWER LOBE. Visualized lung bases otherwise clear. Heart moderately enlarged. Small pericardial effusion versus pericardial thickening. Hepatobiliary: Normal unenhanced appearance of the liver. Surgically absent gallbladder. No unexpected biliary  ductal dilation. Pancreas: Normal unenhanced appearance. Spleen: Subtle low attenuation involving the lower pole of the spleen, not present on the prior CT, with associated edema in the perisplenic fat. Adrenals/Urinary Tract: Normal appearing adrenal glands. Scarring and mild cortical thinning involving both kidneys. Allowing for the unenhanced technique, no significant focal parenchymal abnormalities involving either kidney. No hydronephrosis. No urinary tract calculi. Normal appearing mildly distended urinary bladder. Stomach/Bowel: Stomach normal in appearance for the degree of distention. Normal-appearing small bowel. Mobile cecum positioned in the RIGHT UPPER QUADRANT. Large rectal colonic stool burden and moderate colonic stool burden elsewhere. High attenuation ingested material within the colon and in the normal appearing appendix located in the RIGHT mid abdomen. No focal colonic abnormality. Vascular/Lymphatic: Mild atherosclerosis involving the abdominal aorta without evidence of aneurysm. No pathologic lymphadenopathy. Reproductive: Normal-appearing uterus and ovaries without evidence of adnexal mass. Other: Mild edema in the abdominal wall overlying both flanks, right greater than left. Musculoskeletal: Degenerative disc disease and spondylosis at L5-S1, broad-based central disc protrusion and multifactorial spinal stenosis at L4-5 and diffuse facet degenerative changes throughout the lumbar spine. No acute findings. IMPRESSION: 1. Subtle low attenuation involving the lower pole of the spleen, not present on the prior CT, with associated edema in the perisplenic fat, possibly indicating splenic infarct or splenic abscess. CT abdomen with contrast may be confirmatory. If the patient's renal function does not allow intravenous contrast administration, then ultrasound may be helpful in further evaluation. 2. No acute abnormalities otherwise involving the abdomen or pelvis. 3. LEFT pleural effusion and  associated mild passive atelectasis in the LEFT LOWER LOBE. 4. Small pericardial effusion versus pericardial thickening. 5. Aortic Atherosclerosis (ICD10-I70.0). Electronically Signed   By: Evangeline Dakin M.D.   On: 03/11/2021 12:12    Assessment and plan- Patient is a 70 y.o. female with history of antiphospholipid antibody syndrome on Eliquis now admitted for acute encephalopathy secondary to recent embolic stroke.  Hematology consulted for anemia  1.  Normocytic anemia: Patient's hemoglobin has been gradually drifting down from 10 to presently 7.3.  Also new onset thrombocytopenia with a platelet count of fluctuate between 60s to 80s  which is new since January 2022.Prior to that patient's hemoglobin was around 11 between July to December 2021.  Iron studies are not indicative of iron deficiency.  I would not recommend checking a fecal occult blood test to rule out any GI bleeding at this time since there is no evidence of iron deficiency and no overt blood loss during this admission.  I will complete her anemia work-up including checking a folate level, reticulocyte count, LDH, B1 B6 copper, haptoglobin and multiple myeloma panel.  If the results of anemia work-up did not reveal a discernible etiology given the significant anemia as well as concomitant thrombocytopenia I would be inclined to pursue a bone marrow biopsy which can be either done as an inpatient or outpatient and patient does not need to come off her anticoagulation for this.  I have explained all this to the patient's daughter as well as her sister-in-law in detail.  Patient noted to have a possible splenic infarct/splenic abscess on CT renal stone study which was followed by an ultrasound spleen which shows again a complex fluid collection along the inferior aspect of the spleen which could represent abnormal splenic tissue versus hematoma.  I do not think that this would explain patient's anemia and it would be reasonable to repeat  ultrasound in 6 to 8 weeks time.  2.  History of antiphospholipid antibody syndrome: I recommend continuing ongoing Lovenox and eventually transitioning to Coumadin since Coumadin has been shown to be more effective than newer anticoagulants and APS.  Also patient has some underlying CKD and Coumadin would be a safer choice especially if her INR can be monitored reliably.   Visit Diagnosis 1. Acute metabolic encephalopathy   2. Confusion   3. Dehydration   4. AKI (acute kidney injury) ([place])   5. Acute cystitis with hematuria   6. Splenic infarct     Dr. Randa Evens, MD, MPH [facility] [phone] 03/14/2021  4:08 PM

## 2021-03-14 NOTE — Consult Note (Addendum)
[facility] SURGICAL CONSULTATION NOTE (initial) - cpt: 20254   HISTORY OF PRESENT ILLNESS (HPI):  70 y.o. female presented to [facility] ED initially on 04/09 for evaluation of AMS. At the time of presentation, patient's daughter had provided much of the history and reported a significant decline in the patient's functional status in the last month but acutely worse in the days leading up to presentation including confusion and agitation. Of note, she has a history of admission earlier in January for CVA while on coumadin and was transitioned to Eliquis. Additionally, she had subsequent presention on 03/23 for trimalleolar fracture managed non-operatively as an outpatient. Last week, she was treated for UTI as well with 3 days of Keflex. History at presentation limited secondary to AMS. Work up in the ED on 04/09 revealed normal WBC to 8.5, Hgb low at 8.6, AKI with sCr - 2.03, UCx grew out Diphtheroids, INR found to be 1.6, and CT Renal stone was concerning for possible splenic hematoma. CT Head and subsequent Brain MRI were concerning for chronic embolic injuries. She was ultimately admitted to the medicine service.   Since admission, she has been transitioned back to coumadin as she is failing Eliquis as an outpatient. She has been seen by cardiology and pending hematology evaluation. Her Hgb has continued to trend downward throughout admission, most recent trend: 8.6 --> 7.6 --> 7.6 --> 7.4 in the last 3 days. Her renal function has improved mildly with sCr - 1.84. She did have splenic US today (04/12) which was concerning for possible for splenic hematoma. Patient not able to contribute any reliable history secondary to mental acuity. I was able to speak to her daughter, Satine Hausner ([place]), on the phone. She endorses her mother had been complaining of severe left sided abdominal pain the last three days including the day of admission. This is typically resolved with narcotics, as it is  now, but quickly returns. She denied any known falls or trauma since her fall in late March.   Surgery is consulted by hospitalist physician Dr. Nolberto Hanlon, MD in this context for evaluation and management of possible splenic hematoma.  PAST MEDICAL HISTORY (PMH):  Past Medical History:  Diagnosis Date  . Arthritis 04/02/1996  . Depression   . GERD (gastroesophageal reflux disease) 04/03/2019  . Hyperlipidemia   . Hypertension   . Renal insufficiency   . Stroke ([place])   . Thyroid disease      PAST SURGICAL HISTORY ([place]):  Past Surgical History:  Procedure Laterality Date  . CESAREAN SECTION    . CHOLECYSTECTOMY    . COLONOSCOPY  2012   repeat in 10 yrs  . KNEE ARTHROSCOPY W/ MENISCAL REPAIR Left      MEDICATIONS:  Prior to Admission medications   Medication Sig Start Date End Date Taking? Authorizing Provider  acetaminophen (TYLENOL) 325 MG tablet Take 2 tablets (650 mg total) by mouth every 4 (four) hours as needed for mild pain (or temp > 37.5 C (99.5 F)). 01/19/21  Yes Angiulli, Lavon Paganini, PA-C  apixaban (ELIQUIS) 5 MG TABS tablet Take 1 tablet (5 mg total) by mouth 2 (two) times daily. 01/19/21  Yes Angiulli, Lavon Paganini, PA-C  ascorbic acid (VITAMIN C) 500 MG tablet Take 500 mg by mouth 2 (two) times daily.   Yes [provider]  atorvastatin (LIPITOR) 20 MG tablet Take 1 tablet (20 mg total) by mouth daily. Patient taking differently: Take 20 mg by mouth every evening. 01/20/21  Yes Lauraine Rinne  J, PA-C  escitalopram (LEXAPRO) 20 MG tablet Take 1 tablet (20 mg total) by mouth daily. 12/12/20  Yes Juline Patch, MD  famotidine (PEPCID) 20 MG tablet Take 20 mg by mouth daily.   Yes [provider]  ferrous sulfate 325 (65 FE) MG EC tablet Take 1 tablet (325 mg total) by mouth daily with breakfast. Patient taking differently: Take 325 mg by mouth at bedtime. 12/12/20  Yes Juline Patch, MD  levothyroxine (SYNTHROID) 100 MCG tablet Take 1 tablet (100 mcg  total) by mouth daily at 6 (six) AM. 12/30/20  Yes Lorella Nimrod, MD  loperamide (IMODIUM) 2 MG capsule Take 1 capsule (2 mg total) by mouth every 6 (six) hours as needed for diarrhea or loose stools. 12/29/20  Yes Lorella Nimrod, MD  montelukast (SINGULAIR) 10 MG tablet TAKE 1 TABLET BY MOUTH EVERYDAY AT BEDTIME Patient taking differently: Take 10 mg by mouth at bedtime. TAKE 1 TABLET BY MOUTH EVERYDAY AT BEDTIME 03/11/21  Yes Juline Patch, MD  ondansetron (ZOFRAN-ODT) 4 MG disintegrating tablet Take 4 mg by mouth every 6 (six) hours as needed for nausea or vomiting.   Yes [provider]  oxybutynin (DITROPAN XL) 15 MG 24 hr tablet Take 1 tablet (15 mg total) by mouth at bedtime. 01/19/21  Yes Angiulli, Lavon Paganini, PA-C  sodium chloride (OCEAN) 0.65 % SOLN nasal spray Place 2 sprays into both nostrils every 6 (six) hours.   Yes [provider]  traMADol (ULTRAM) 50 MG tablet Take 50 mg by mouth every 6 (six) hours.   Yes [provider]  traZODone (DESYREL) 100 MG tablet Take 1 tablet (100 mg total) by mouth at bedtime. 01/19/21  Yes Angiulli, Lavon Paganini, PA-C  vitamin B-12 (CYANOCOBALAMIN) 1000 MCG tablet Take 1,000 mcg by mouth daily.   Yes [provider]  buPROPion (WELLBUTRIN XL) 150 MG 24 hr tablet Take 1 tablet (150 mg total) by mouth daily. Patient not taking: No sig reported 02/08/21   Juline Patch, MD  diclofenac Sodium (VOLTAREN) 1 % GEL Apply 2 g topically 4 (four) times daily as needed (left knee pain). Patient not taking: No sig reported 01/19/21   Angiulli, Lavon Paganini, PA-C  neomycin-bacitracin-polymyxin (NEOSPORIN) OINT Apply 1 application topically 2 (two) times daily. Patient not taking: No sig reported 01/19/21   Angiulli, Lavon Paganini, PA-C     ALLERGIES:  Allergies  Allergen Reactions  . Penicillin G Rash     SOCIAL HISTORY:  Social History   Socioeconomic History  . Marital status: Married    Spouse name: Not on file  . Number of children: 1   . Years of education: Not on file  . Highest education level: Master's degree (e.g., MA, MS, MEng, MEd, MSW, MBA)  Occupational History  . Occupation: Retired  Tobacco Use  . Smoking status: Never Smoker  . Smokeless tobacco: Never Used  . Tobacco comment: none  Vaping Use  . Vaping Use: Never used  Substance and Sexual Activity  . Alcohol use: Yes    Alcohol/week: 1.0 standard drink    Types: 1 Glasses of wine per week    Comment: occasional drink  . Drug use: No  . Sexual activity: Not Currently    Birth control/protection: Post-menopausal  Other Topics Concern  . Not on file  Social History Narrative  . Not on file   Social Determinants of Health   Financial Resource Strain: Not on file  Food Insecurity: Not on file  Transportation Needs: Not on file  Physical Activity: Not on file  Stress: Not on file  Social Connections: Not on file  Intimate Partner Violence: Not on file     FAMILY HISTORY:  Family History  Problem Relation Age of Onset  . Stroke Father   . Breast cancer Maternal Grandmother   . Kidney cancer Mother   . Cancer Mother   . Arthritis Brother       REVIEW OF SYSTEMS:  Review of Systems  Unable to perform ROS: Mental acuity  Gastrointestinal: Positive for abdominal pain.  Musculoskeletal: Positive for falls (x3 weeks ago).  Neurological: Positive for weakness.    VITAL SIGNS:  Temp:  [97.6 F (36.4 C)-98.7 F (37.1 C)] 98.3 F (36.8 C) (04/12 1244) Pulse Rate:  [86-102] 102 (04/12 1244) Resp:  [16-18] 16 (04/12 1244) BP: (129-150)/(75-94) 150/94 (04/12 1244) SpO2:  [98 %-100 %] 100 % (04/12 1244)     Height: 5\' 8"  (172.7 cm) Weight: 83.5 kg BMI (Calculated): 27.98   INTAKE/OUTPUT:  04/11 0701 - 04/12 0700 In: 100 [IV Piggyback:100] Out: 1250 [Urine:1250]  PHYSICAL EXAM:  Physical Exam Vitals and nursing note reviewed.  Constitutional:      Appearance: Normal appearance.     Comments: Patient resting in bed, agitated, calling  out/trying to get out of bed  HENT:     Head: Normocephalic and atraumatic.  Eyes:     General: No scleral icterus.    Conjunctiva/sclera: Conjunctivae normal.  Cardiovascular:     Rate and Rhythm: Tachycardia present.     Pulses: Normal pulses.  Pulmonary:     Effort: Pulmonary effort is normal. No respiratory distress.  Abdominal:     General: There is no distension.     Palpations: Abdomen is soft.     Tenderness: There is no abdominal tenderness. There is no guarding or rebound.     Comments: Abdomen is soft, no appreciable tenderness however daughter endorses that she has quick return of pain after narcotic pain medications, non-distended, no rebound/guarding  Genitourinary:    Comments: Deferred Musculoskeletal:     Comments: Cast to the RLE  Skin:    General: Skin is warm and dry.     Coloration: Skin is not pale.     Findings: No erythema.  Neurological:     Mental Status: She is alert. She is disoriented.     Comments: Oriented to year, disoriented to place and situation  Psychiatric:     Comments: Unable to reliably assess       Labs:  CBC Latest Ref Rng & Units 03/14/2021 03/13/2021 03/12/2021  WBC 4.0 - 10.5 K/uL 8.5 9.5 7.3  Hemoglobin 12.0 - 15.0 g/dL 7.3(L) 7.6(L) 7.6(L)  Hematocrit 36.0 - 46.0 % 22.0(L) 23.4(L) 24.0(L)  Platelets 150 - 400 K/uL 94(L) 78(L) 80(L)   CMP Latest Ref Rng & Units 03/14/2021 03/13/2021 03/12/2021  Glucose 70 - 99 mg/dL 98 109(H) 88  BUN 8 - 23 mg/dL 21 26(H) 31(H)  Creatinine 0.44 - 1.00 mg/dL 1.84(H) 1.77(H) 1.80(H)  Sodium 135 - 145 mmol/L 136 136 137  Potassium 3.5 - 5.1 mmol/L 3.7 4.0 4.1  Chloride 98 - 111 mmol/L 104 104 103  CO2 22 - 32 mmol/L 25 24 24   Calcium 8.9 - 10.3 mg/dL 9.4 9.7 9.5  Total Protein 6.5 - 8.1 g/dL - - -  Total Bilirubin 0.3 - 1.2 mg/dL - - -  Alkaline Phos 38 - 126 U/L - - -  AST 15 -  41 U/L - - -  ALT 0 - 44 U/L - - -     Imaging studies:   CT Renal Stone (03/11/2021) personally reviewed  showing very subtle potential splenic hematoma, and radiologist report reviewed:  IMPRESSION: 1. Subtle low attenuation involving the lower pole of the spleen, not present on the prior CT, with associated edema in the perisplenic fat, possibly indicating splenic infarct or splenic abscess. CT abdomen with contrast may be confirmatory. If the patient's renal function does not allow intravenous contrast administration, then ultrasound may be helpful in further evaluation. 2. No acute abnormalities otherwise involving the abdomen or pelvis. 3. LEFT pleural effusion and associated mild passive atelectasis in the LEFT LOWER LOBE. 4. Small pericardial effusion versus pericardial thickening. 5. Aortic Atherosclerosis (ICD10-I70.0).   US Spleen (03/14/2021) personally reviewed concerning for splenic hematoma, and radiologist report reviewed:  IMPRESSION: Complex fluid collection along the inferior aspect of spleen corresponding with the abnormality on the recent CT. The echogenic material surrounded by fluid could represent abnormal splenic tissue versus hematoma. The fluid collection and CT findings are more suggestive for a hematoma due to a splenic laceration rather than an infarct or abscess. Consider a follow-up ultrasound in 6-8 weeks to see if the fluid collection resolves.   Assessment/Plan: (ICD-10's: S66.029A) 70 y.o. female with abdominal pain and anemia concerning for possible splenic hematoma with unknown chronicity, complicated by pertinent comorbidities including CVA, DVT, antiphospholipid antibody syndrome in need for chronic anticoagulation on Coumadin, as well as CKD, HLD, and debilitation/cognitive impairment.   - She may potentially need CTA Abdomen/Pelvis to evaluate for active bleeding/hematoma around the spleen giving reports of continued pain, understanding that this may acute worsening her baseline renal function. However, may be worthwhile to hold off pending vascular  surgery evaluation, as she may just need embolization in the setting of CKD3.   - Given her need for chronic anticoagulation, she is certainly at an increased risk for surgical interventions; No emergent surgical intervention warranted, we will monitor.   - Monitor H&H; transfuse as needed  - Monitor abdominal examination   - IVF resuscitation  - Pain control prn; antiemetics prn   - Further management per primary service  All of the above findings and recommendations were discussed with the patient's daughter via telephone [facility] [place]), and all of patient's family's questions were answered to their expressed satisfaction.  Thank you for the opportunity to participate in this patient's care.   -- Edison Simon, PA-C [facility] 03/14/2021, 2:50 PM [phone] M-F: 7am - 4pm

## 2021-03-14 NOTE — Progress Notes (Addendum)
Patient Name: [name] Date of Encounter: 03/14/2021  Hospital Problem List     Active Problems:   Adult hypothyroidism   Essential hypertension   Anticoagulant long-term use   Antiphospholipid antibody syndrome (HCC)   Stage 3 chronic kidney disease (HCC)   Thrombocytopenia (HCC)   CVA (cerebral vascular accident) ([place])   Ischemic cerebrovascular accident (CVA) of frontal lobe (HCC)   Embolism of left anterior cerebral artery   AKI (acute kidney injury) ([place])   Pressure injury of skin   Encephalopathy    Patient Profile       70 y.o.femalewith history ofhypertension,history of right trimalleoli are fracture,hypothyroid, history of DVT and antiphospholipid antibody syndrome previously on Coumadin, CKD 3B, hyperlipidemia, depression, mild cognitive decline, history of CVA, right PCA infarct, presents emergency department for chief concerns of confusion. This has apparently been there for several weeks.Brain MRI done today revealed evidence of shower of embolic pattern acute infarcts invovling the infr and suprtentorial brain.Patient had been on warfarin for quite a few years and then had difficulty managing the levels and had a CVA felt to be secondary to an adequate INR and was converted to Eliquis. As mentioned above over the past several weeks there has been altered mental status and confusion. She was brought to the emergency room where laboratories revealed acute on chronic renal insufficiency with a creatinine up to 2.03 sodium 132, hemoglobin of 7.6 which has been gradually declining from a value of 10.32 months ago. Protocol serum troponins were drawn showed High-sensitivity troponins of 543 and 564. EKG showed sinus rhythm with no ischemia. No arrhythmia. Patient denied chest pain. Patient was placed on heparin Eliquis stopped due to the elevated troponins. She was placed on a statin. Brain CT done yesterday in emergency room showed prior infarct of the  right occipital lobe superior posterior right parietal lobe and posterior mid left cerebellum. No acute infarct or bleeding was noted. Brain MRI done today showed shower of embolic pattern acute infarcts involving the infra and supratentorial brain. Extensive chronic ischemic injury. These were bilateral. Patient had an echo in January showing normal EF. No shunt by color-flow Doppler.   Subjective   Complains of left lower abdominal and suprapubic pain.  Inpatient Medications    . atorvastatin  20 mg Oral QHS  . buPROPion  150 mg Oral Daily  . enoxaparin (LOVENOX) injection  1 mg/kg Subcutaneous Q12H  . escitalopram  20 mg Oral Daily  . famotidine  20 mg Oral Daily  . feeding supplement  237 mL Oral TID BM  . ferrous sulfate  325 mg Oral QHS  . levothyroxine  100 mcg Oral Q0600  . mouth rinse  15 mL Mouth Rinse BID  . melatonin  5 mg Oral QHS  . montelukast  10 mg Oral QHS  . multivitamin with minerals   Oral Daily  . oxybutynin  15 mg Oral QHS  . polyethylene glycol  17 g Oral BID  . senna-docusate  2 tablet Oral BID  . vitamin B-12  1,000 mcg Oral Daily  . warfarin  3 mg Oral ONCE-1600  . Warfarin - Pharmacist Dosing Inpatient   Does not apply q1600    Vital Signs    Vitals:   03/13/21 2029 03/14/21 0515 03/14/21 0754 03/14/21 1244  BP: (!) 143/79 129/76 (!) 147/76 (!) 150/94  Pulse: (!) 101 86 (!) 101 (!) 102  Resp:  18 16 16   Temp: 98.2 F (36.8 C) 97.6 F (36.4 C)  98.1 F (36.7 C) 98.3 F (36.8 C)  TempSrc:  Oral    SpO2: 99% 99% 100% 100%  Weight:      Height:        Intake/Output Summary (Last 24 hours) at 03/14/2021 1347 Last data filed at 03/14/2021 0515 Gross per 24 hour  Intake 100 ml  Output 350 ml  Net -250 ml   Filed Weights   03/11/21 0921  Weight: 83.5 kg    Physical Exam    GEN: Well nourished, well developed, in no acute distress.  HEENT: normal.  Neck: Supple, no JVD, carotid bruits, or masses. Cardiac: RRR, no murmurs, rubs, or  gallops. No clubbing, cyanosis, edema.  Radials/DP/PT 2+ and equal bilaterally.  Respiratory:  Respirations regular and unlabored, clear to auscultation bilaterally. GI: Soft, tenderness to deep palpation in the left lower quadrant. MS: no deformity or atrophy. Skin: warm and dry, no rash. Neuro:  Strength and sensation are intact. Psych: Normal affect.  Labs    CBC Recent Labs    03/13/21 0402 03/14/21 0457  WBC 9.5 8.5  NEUTROABS 7.3 5.7  HGB 7.6* 7.3*  HCT 23.4* 22.0*  MCV 95.1 95.2  PLT 78* 94*   Basic Metabolic Panel Recent Labs    03/13/21 0402 03/14/21 0457  NA 136 136  K 4.0 3.7  CL 104 104  CO2 24 25  GLUCOSE 109* 98  BUN 26* 21  CREATININE 1.77* 1.84*  CALCIUM 9.7 9.4  MG 1.8  --   PHOS 3.5  --    Liver Function Tests No results for input(s): AST, ALT, ALKPHOS, BILITOT, PROT, ALBUMIN in the last 72 hours. No results for input(s): LIPASE, AMYLASE in the last 72 hours. Cardiac Enzymes No results for input(s): CKTOTAL, CKMB, CKMBINDEX, TROPONINI in the last 72 hours. BNP No results for input(s): BNP in the last 72 hours. D-Dimer No results for input(s): DDIMER in the last 72 hours. Hemoglobin A1C Recent Labs    03/14/21 0457  HGBA1C 5.1   Fasting Lipid Panel Recent Labs    03/14/21 0457  CHOL 111  HDL 38*  LDLCALC 53  TRIG 99  CHOLHDL 2.9   Thyroid Function Tests Recent Labs    03/12/21 1216  TSH 4.896*    Telemetry    Normal sinus rhythm  ECG    Sinus rhythm no ischemia  Radiology    DG Chest 2 View  Result Date: 03/11/2021 CLINICAL DATA:  Confusion EXAM: CHEST - 2 VIEW COMPARISON:  January 08, 2021 FINDINGS: Interstitium is mildly thickened. No edema or airspace opacity. Heart size and pulmonary vascularity are normal. No adenopathy. No bone lesions. Surgical clips noted in gallbladder fossa region. IMPRESSION: Interstitial thickening, likely indicative of underlying chronic bronchitis. No edema or airspace opacity. Cardiac  silhouette normal. Electronically Signed   By: Lowella Grip III M.D.   On: 03/11/2021 09:56   DG Ankle Complete Right  Result Date: 02/22/2021 CLINICAL DATA:  Right ankle pain, fell EXAM: RIGHT ANKLE - COMPLETE 3+ VIEW COMPARISON:  05/23/2020 FINDINGS: Frontal, oblique, lateral views of the right ankle are obtained. There is an oblique displaced lateral malleolar fracture, with lateral subluxation of the talus relative to the tibial plafond. A minimally comminuted transverse medial malleolar fracture is also noted, with mild distraction. The minimally displaced fracture through the posterior malleolus is noted on lateral view. There is diffuse soft tissue swelling. Large inferior calcaneal spur. IMPRESSION: 1. Trimalleolar right ankle fracture, with lateral subluxation of the talus. 2.  Diffuse soft tissue swelling. Electronically Signed   By: Randa Ngo M.D.   On: 02/22/2021 20:41   CT Head Wo Contrast  Result Date: 03/11/2021 CLINICAL DATA:  Altered mental status/confusion EXAM: CT HEAD WITHOUT CONTRAST TECHNIQUE: Contiguous axial images were obtained from the base of the skull through the vertex without intravenous contrast. COMPARISON:  February 22, 2021 FINDINGS: Brain: Moderate diffuse atrophy is stable. There is no intracranial mass, hemorrhage, extra-axial fluid collection, or midline shift. Decreased attenuation is noted with encephalomalacia in the medial right occipital lobe consistent with prior infarct. Prior infarct also noted in the posterosuperior right parietal lobe, stable. A prior infarct is noted in the posterior aspect of the mid left cerebellum, stable. There is mild decreased attenuation in portions of the centra semiovale bilaterally. No acute appearing infarct is evident. Vascular: There is no hyperdense vessel. There is calcification in each carotid siphon region. Skull: The bony calvarium appears intact. Sinuses/Orbits: There is slight mucosal thickening in several ethmoid air  cells. Visualized orbits appear symmetric bilaterally. Other: Mastoid air cells are clear. IMPRESSION: Stable atrophy. Prior infarcts in the medial right occipital lobe, superior posterior right parietal lobe, and posterior mid left cerebellum. Decreased attenuation in the periventricular white matter is likely due to small vessel vascular disease, although a mild degree of interstitial edema secondary to the ventricular enlargement could present in this manner. No acute infarct evident. No mass or hemorrhage. There are foci of arterial vascular calcification. There is slight mucosal thickening in several ethmoid air cells. Electronically Signed   By: Lowella Grip III M.D.   On: 03/11/2021 10:00   CT Head Wo Contrast  Result Date: 02/22/2021 CLINICAL DATA:  [place]. EXAM: CT HEAD WITHOUT CONTRAST CT CERVICAL SPINE WITHOUT CONTRAST TECHNIQUE: Multidetector CT imaging of the head and cervical spine was performed following the standard protocol without intravenous contrast. Multiplanar CT image reconstructions of the cervical spine were also generated. COMPARISON:  None. FINDINGS: CT HEAD FINDINGS Brain: Age advanced cerebral atrophy and associated ventriculomegaly. Evidence of multiple prior infarcts most notably in the right occipital lobe with associated encephalomalacia and ex vacuo dilatation of the occipital horn of the right lateral ventricle. I do not see any findings suggestive of an acute hemispheric infarction. No intracranial hemorrhage. No extra-axial fluid collections. Vascular: Vascular calcifications but no definite aneurysm or hyperdense vessels. Skull: No skull fracture or bone lesions. Sinuses/Orbits: The paranasal sinuses and mastoid air cells are clear. The globes are intact. Other: No scalp lesions or scalp hematoma. CT CERVICAL SPINE FINDINGS Alignment: Normal overall alignment of the cervical vertebral bodies. Skull base and vertebrae: No acute fracture. No primary bone lesion or focal  pathologic process. Soft tissues and spinal canal: No prevertebral fluid or swelling. No visible canal hematoma. Disc levels: Degenerative cervical spondylosis with disc disease and facet disease most notable at C4-5 and C5-6. No large disc protrusions or significant spinal stenosis. Uncinate spurring and facet disease but no significant foraminal stenosis. Upper chest: The visualized lung apices are grossly clear. Other: No neck mass, adenopathy or hematoma. IMPRESSION: 1. Age advanced cerebral atrophy and associated ventriculomegaly. 2. Evidence of multiple prior infarcts. 3. No acute intracranial findings or skull fracture. 4. Degenerative cervical spondylosis with disc disease and facet disease but no acute cervical spine fracture. Electronically Signed   By: Marijo Sanes M.D.   On: 02/22/2021 21:52   CT Cervical Spine Wo Contrast  Result Date: 02/22/2021 CLINICAL DATA:  [place]. EXAM: CT HEAD WITHOUT CONTRAST CT CERVICAL  SPINE WITHOUT CONTRAST TECHNIQUE: Multidetector CT imaging of the head and cervical spine was performed following the standard protocol without intravenous contrast. Multiplanar CT image reconstructions of the cervical spine were also generated. COMPARISON:  None. FINDINGS: CT HEAD FINDINGS Brain: Age advanced cerebral atrophy and associated ventriculomegaly. Evidence of multiple prior infarcts most notably in the right occipital lobe with associated encephalomalacia and ex vacuo dilatation of the occipital horn of the right lateral ventricle. I do not see any findings suggestive of an acute hemispheric infarction. No intracranial hemorrhage. No extra-axial fluid collections. Vascular: Vascular calcifications but no definite aneurysm or hyperdense vessels. Skull: No skull fracture or bone lesions. Sinuses/Orbits: The paranasal sinuses and mastoid air cells are clear. The globes are intact. Other: No scalp lesions or scalp hematoma. CT CERVICAL SPINE FINDINGS Alignment: Normal overall  alignment of the cervical vertebral bodies. Skull base and vertebrae: No acute fracture. No primary bone lesion or focal pathologic process. Soft tissues and spinal canal: No prevertebral fluid or swelling. No visible canal hematoma. Disc levels: Degenerative cervical spondylosis with disc disease and facet disease most notable at C4-5 and C5-6. No large disc protrusions or significant spinal stenosis. Uncinate spurring and facet disease but no significant foraminal stenosis. Upper chest: The visualized lung apices are grossly clear. Other: No neck mass, adenopathy or hematoma. IMPRESSION: 1. Age advanced cerebral atrophy and associated ventriculomegaly. 2. Evidence of multiple prior infarcts. 3. No acute intracranial findings or skull fracture. 4. Degenerative cervical spondylosis with disc disease and facet disease but no acute cervical spine fracture. Electronically Signed   By: Marijo Sanes M.D.   On: 02/22/2021 21:52   MR BRAIN WO CONTRAST  Result Date: 03/12/2021 CLINICAL DATA:  Confusion. Recent history of stroke. Hypercoagulable. EXAM: MRI HEAD WITHOUT CONTRAST TECHNIQUE: Multiplanar, multiecho pulse sequences of the brain and surrounding structures were obtained without intravenous contrast. COMPARISON:  12/26/2020 FINDINGS: Brain: Innumerable subcentimeter mainly cortically based acute infarcts along the bilateral cerebral convexity with predilection for the watershed regions. Similar size infarcts are seen in the bilateral cerebellum. Numerous pre-existing cerebellar and cortical infarcts with generalized brain atrophy. Large remote right occipital infarct with dense encephalomalacia. No acute hemorrhage, hydrocephalus, or mass. Vascular: Major flow voids are preserved Skull and upper cervical spine: Normal marrow signal Sinuses/Orbits: Negative IMPRESSION: 1. Shower of embolic pattern acute infarcts involving the infra and supratentorial brain. 2. Extensive chronic ischemic injury. Electronically  Signed   By: Monte Fantasia M.D.   On: 03/12/2021 11:52   CT Renal Stone Study  Result Date: 03/11/2021 CLINICAL DATA:  70 year old with chronic kidney disease, presenting with a urinary tract infection. EXAM: CT ABDOMEN AND PELVIS WITHOUT CONTRAST TECHNIQUE: Multidetector CT imaging of the abdomen and pelvis was performed following the standard protocol without IV contrast. COMPARISON:  01/09/2021. FINDINGS: Lower chest: LEFT pleural effusion and associated mild passive atelectasis in th, broad-based central disc protrusion and multifactorial spinal stenosis at L4-5, and diffuse degenerative changes e LEFT LOWER LOBE. Visualized lung bases otherwise clear. Heart moderately enlarged. Small pericardial effusion versus pericardial thickening. Hepatobiliary: Normal unenhanced appearance of the liver. Surgically absent gallbladder. No unexpected biliary ductal dilation. Pancreas: Normal unenhanced appearance. Spleen: Subtle low attenuation involving the lower pole of the spleen, not present on the prior CT, with associated edema in the perisplenic fat. Adrenals/Urinary Tract: Normal appearing adrenal glands. Scarring and mild cortical thinning involving both kidneys. Allowing for the unenhanced technique, no significant focal parenchymal abnormalities involving either kidney. No hydronephrosis. No urinary tract calculi. Normal  appearing mildly distended urinary bladder. Stomach/Bowel: Stomach normal in appearance for the degree of distention. Normal-appearing small bowel. Mobile cecum positioned in the RIGHT UPPER QUADRANT. Large rectal colonic stool burden and moderate colonic stool burden elsewhere. High attenuation ingested material within the colon and in the normal appearing appendix located in the RIGHT mid abdomen. No focal colonic abnormality. Vascular/Lymphatic: Mild atherosclerosis involving the abdominal aorta without evidence of aneurysm. No pathologic lymphadenopathy. Reproductive: Normal-appearing  uterus and ovaries without evidence of adnexal mass. Other: Mild edema in the abdominal wall overlying both flanks, right greater than left. Musculoskeletal: Degenerative disc disease and spondylosis at L5-S1, broad-based central disc protrusion and multifactorial spinal stenosis at L4-5 and diffuse facet degenerative changes throughout the lumbar spine. No acute findings. IMPRESSION: 1. Subtle low attenuation involving the lower pole of the spleen, not present on the prior CT, with associated edema in the perisplenic fat, possibly indicating splenic infarct or splenic abscess. CT abdomen with contrast may be confirmatory. If the patient's renal function does not allow intravenous contrast administration, then ultrasound may be helpful in further evaluation. 2. No acute abnormalities otherwise involving the abdomen or pelvis. 3. LEFT pleural effusion and associated mild passive atelectasis in the LEFT LOWER LOBE. 4. Small pericardial effusion versus pericardial thickening. 5. Aortic Atherosclerosis (ICD10-I70.0). Electronically Signed   By: Evangeline Dakin M.D.   On: 03/11/2021 12:12   US ABDOMINAL PELVIC ART/VENT FLOW DOPPLER LIMITED  Result Date: 03/14/2021 CLINICAL DATA:  Evaluate splenic abnormality on recent CT. EXAM: ULTRASOUND ABDOMEN LIMITED WITH DOPPLER TECHNIQUE: Images were obtained at the area of concern at the spleen. COMPARISON:  CT of the abdomen and pelvis 03/11/2021 FINDINGS: Spleen measures 11.8 x 5.3 x 12.7 cm. Calculated splenic volume is 397 mL. Superior and mid portions of the spleen have normal echotexture. Along the inferior aspect of the spleen there is fluid surrounding a solid structure which has similar echogenicity to the spleen. Suspect this is fluid collection around a small area of abnormal splenic tissue or fluid surrounding a splenic hematoma. These findings correspond with the recent CT findings. This area of concern with surrounding fluid measures 4.1 x 4.0 x 4.5 cm. Arterial  and venous flow at the splenic hilum. No significant internal vascularity within the tissue surrounded by fluid. IMPRESSION: Complex fluid collection along the inferior aspect of spleen corresponding with the abnormality on the recent CT. The echogenic material surrounded by fluid could represent abnormal splenic tissue versus hematoma. The fluid collection and CT findings are more suggestive for a hematoma due to a splenic laceration rather than an infarct or abscess. Consider a follow-up ultrasound in 6-8 weeks to see if the fluid collection resolves. Electronically Signed   By: Markus Daft M.D.   On: 03/14/2021 13:23   US SPLEEN (ABDOMEN LIMITED)  Result Date: 03/14/2021 CLINICAL DATA:  Evaluate splenic abnormality on recent CT. EXAM: ULTRASOUND ABDOMEN LIMITED WITH DOPPLER TECHNIQUE: Images were obtained at the area of concern at the spleen. COMPARISON:  CT of the abdomen and pelvis 03/11/2021 FINDINGS: Spleen measures 11.8 x 5.3 x 12.7 cm. Calculated splenic volume is 397 mL. Superior and mid portions of the spleen have normal echotexture. Along the inferior aspect of the spleen there is fluid surrounding a solid structure which has similar echogenicity to the spleen. Suspect this is fluid collection around a small area of abnormal splenic tissue or fluid surrounding a splenic hematoma. These findings correspond with the recent CT findings. This area of concern with surrounding fluid measures  4.1 x 4.0 x 4.5 cm. Arterial and venous flow at the splenic hilum. No significant internal vascularity within the tissue surrounded by fluid. IMPRESSION: Complex fluid collection along the inferior aspect of spleen corresponding with the abnormality on the recent CT. The echogenic material surrounded by fluid could represent abnormal splenic tissue versus hematoma. The fluid collection and CT findings are more suggestive for a hematoma due to a splenic laceration rather than an infarct or abscess. Consider a follow-up  ultrasound in 6-8 weeks to see if the fluid collection resolves. Electronically Signed   By: Markus Daft M.D.   On: 03/14/2021 13:23    Assessment & Plan    70 y.o.femalewith history ofhypertension,history of right trimalleoli are fracture,hypothyroid, history of DVT and antiphospholipid antibody syndrome previously on Coumadin, CKD 3B, hyperlipidemia, depression, mild cognitive decline, history of CVA, right PCA infarct, presents emergency department for chief concerns of confusion. This has apparently been there for several weeks.Brain MRI shows shower of emboli. In nsr at present andshe has been compliant with eliquis. Will need change to warfarin. WIll need neurology input regading heparin for now. Will follow for afib on monitor.  Recommendations;  1 continue with subcu enoxaparin at full dose until therapeutic on warfarin.INR goal between 2 and 3.  Not a candidate for restarting Eliquis as she appears to have failed this.  2.   Acute CVA-appreciate neurology input.    3. Antiphospholipid antibody syndrome.-Hypercoagulable state while on Eliquis may be etiology of this.  No evidence of A. fib.  We will continue to follow.  May need long-term monitoring after discharge to assess whether A. fib is present however patient will need warfarin indefinitely for the antiphospholipid antibody syndrome and therefore this is less important.  Is in sinus rhythm at present.  Will need warfarin indefinitely and bridged if she needs to come off for procedures in the future.Marland Kitchen  Appreciate hematology seeing her  4. Anemia-patient's hemoglobin is dropped significantly recently.  No evidence of bleeding.  Will need to keep INR is low therapeutic as possible following for evidence of bleeding.  5.  Elevated troponins-flat and is not consistent with acute coronary event.  Patient not a candidate for invasive cardiac evaluation at present due to multiple comorbidities including recent CVA, acute renal  insufficiency.  Will review echo when available.  6.  Abdominal pain-work-up underway.  Signed, Javier Docker Tniya Bowditch MD 03/14/2021, 1:47 PM  Pager: [phone]

## 2021-03-14 NOTE — Progress Notes (Signed)
*  PRELIMINARY RESULTS* Echocardiogram 2D Echocardiogram has been performed.  Sherrie Sport 03/14/2021, 10:11 AM

## 2021-03-14 NOTE — Progress Notes (Signed)
Brief Neuro Update:  Briefly, Ms. [name] is a 70 y.o. female with hx of APLA and strokes on warfarin who was switched to Eliquis in Jan 2022, presenting with confusion. MRI reveals multiple punctate acute ischemic infarctions in separate vascular territories infratentorially and supratentorially, many of which are cortically-based, most consistent with a cardioembolic etiology.  - MRA head from 12/26/20: Normal cerebral arteries except for chronic-appearing distal occlusion of the right PCA and moderate stenosis of the left P4 segment. - MRA neck from 12/26/20: No hemodynamically significant stenosis. - TTE 12/27/20: Left ventricular ejection fraction, by estimation, is 55 to 60%. The left ventricle has normal function. The left ventricle has no regional wall motion abnormalities. Left ventricular diastolic parameters are consistent with Grade I diastolic dysfunction (impaired relaxation). Left atrial size was mildly dilated. No mural thrombus described in the report.  Recs: - She is being switched to Coumadin with heparin gtt bridge. Waiting on INR to be therapeutic. - Has Anemia of Chronic disease and scheduled with hematology and potential outpatient EGD and colonoscopy. - Neurology inpatient team will signoff. Please feel free to contact us with any questions or concerns.  [place] Pager Number [phone]

## 2021-03-15 DIAGNOSIS — E039 Hypothyroidism, unspecified: Secondary | ICD-10-CM | POA: Diagnosis not present

## 2021-03-15 DIAGNOSIS — N179 Acute kidney failure, unspecified: Secondary | ICD-10-CM | POA: Diagnosis not present

## 2021-03-15 DIAGNOSIS — R410 Disorientation, unspecified: Secondary | ICD-10-CM | POA: Diagnosis not present

## 2021-03-15 DIAGNOSIS — Z7901 Long term (current) use of anticoagulants: Secondary | ICD-10-CM | POA: Diagnosis not present

## 2021-03-15 DIAGNOSIS — G9341 Metabolic encephalopathy: Secondary | ICD-10-CM | POA: Diagnosis not present

## 2021-03-15 DIAGNOSIS — I10 Essential (primary) hypertension: Secondary | ICD-10-CM | POA: Diagnosis not present

## 2021-03-15 LAB — FOLATE: Folate: 11.9 ng/mL (ref >5.9)

## 2021-03-15 LAB — CBC WITH DIFFERENTIAL/PLATELET
Abs Immature Granulocytes: 0.24 10*3/uL — ABNORMAL HIGH (ref 0.00–0.07)
Basophils Absolute: 0.1 10*3/uL (ref 0.0–0.1)
Basophils Relative: 1 %
Eosinophils Absolute: 0.2 10*3/uL (ref 0.0–0.5)
Eosinophils Relative: 1 %
HCT: 23.8 % — ABNORMAL LOW (ref 36.0–46.0)
Hemoglobin: 7.9 g/dL — ABNORMAL LOW (ref 12.0–15.0)
Immature Granulocytes: 2 %
Lymphocytes Relative: 13 %
Lymphs Abs: 1.6 10*3/uL (ref 0.7–4.0)
MCH: 31.7 pg (ref 26.0–34.0)
MCHC: 33.2 g/dL (ref 30.0–36.0)
MCV: 95.6 fL (ref 80.0–100.0)
Monocytes Absolute: 0.7 10*3/uL (ref 0.1–1.0)
Monocytes Relative: 5 %
Neutro Abs: 9.7 10*3/uL — ABNORMAL HIGH (ref 1.7–7.7)
Neutrophils Relative %: 78 %
Platelets: 131 10*3/uL — ABNORMAL LOW (ref 150–400)
RBC: 2.49 MIL/uL — ABNORMAL LOW (ref 3.87–5.11)
RDW: 13.8 % (ref 11.5–15.5)
WBC: 12.5 10*3/uL — ABNORMAL HIGH (ref 4.0–10.5)
nRBC: 0 % (ref 0.0–0.2)

## 2021-03-15 LAB — RETICULOCYTES
Immature Retic Fract: 36.9 % — ABNORMAL HIGH (ref 2.3–15.9)
RBC.: 2.45 MIL/uL — ABNORMAL LOW (ref 3.87–5.11)
Retic Count, Absolute: 109.5 10*3/uL (ref 19.0–186.0)
Retic Ct Pct: 4.5 % — ABNORMAL HIGH (ref 0.4–3.1)

## 2021-03-15 LAB — BASIC METABOLIC PANEL
Anion gap: 10 (ref 5–15)
BUN: 20 mg/dL (ref 8–23)
CO2: 24 mmol/L (ref 22–32)
Calcium: 9.8 mg/dL (ref 8.9–10.3)
Chloride: 101 mmol/L (ref 98–111)
Creatinine, Ser: 1.82 mg/dL — ABNORMAL HIGH (ref 0.44–1.00)
GFR, Estimated: 30 mL/min — ABNORMAL LOW (ref >60)
Glucose, Bld: 109 mg/dL — ABNORMAL HIGH (ref 70–99)
Potassium: 3.5 mmol/L (ref 3.5–5.1)
Sodium: 135 mmol/L (ref 135–145)

## 2021-03-15 LAB — PROCALCITONIN: Procalcitonin: 6.74 ng/mL

## 2021-03-15 LAB — LACTATE DEHYDROGENASE: LDH: 241 U/L — ABNORMAL HIGH (ref 98–192)

## 2021-03-15 LAB — PROTIME-INR
INR: 2.8 — ABNORMAL HIGH (ref 0.8–1.2)
Prothrombin Time: 29.8 seconds — ABNORMAL HIGH (ref 11.4–15.2)

## 2021-03-15 LAB — OCCULT BLOOD X 1 CARD TO LAB, STOOL: Fecal Occult Bld: NEGATIVE

## 2021-03-15 MED ORDER — FLEET ENEMA 7-19 GM/118ML RE ENEM
1.0000 | ENEMA | Freq: Once | RECTAL | Status: AC
  Administered 2021-03-15: 17:00:00 1 via RECTAL

## 2021-03-15 MED ORDER — SODIUM CHLORIDE 0.45 % IV SOLN: INTRAVENOUS | Status: DC

## 2021-03-15 MED ORDER — LIDOCAINE 5 % EX PTCH
1.0000 | MEDICATED_PATCH | CUTANEOUS | Status: DC
  Administered 2021-03-15 – 2021-03-17 (×3): 1 via TRANSDERMAL
  Filled 2021-03-15 (×3): qty 1

## 2021-03-15 MED ORDER — OXYCODONE HCL 5 MG PO TABS
5.0000 mg | ORAL_TABLET | Freq: Four times a day (QID) | ORAL | Status: DC | PRN
  Administered 2021-03-15 – 2021-03-17 (×5): 5 mg via ORAL
  Filled 2021-03-15 (×5): qty 1

## 2021-03-15 NOTE — TOC Progression Note (Addendum)
Transition of Care [facility]) - Progression Note    Patient Details  Name: [name] MRN: [id number] Date of Birth: July 02, 1951  Transition of Care [facility] CM/SW Contact  Candie Chroman, LCSW Phone Number: 03/15/2021, 11:28 AM  Clinical Narrative: [name] unable to offer a bed. Discussed with daughter over the phone. She said [name] in [name] has patient's referral from [employer]. CSW called admissions coordinator and she asked that we send our referral as well. Will fax today. Per daughter, patient's Medicaid is still pending.     12:55 pm: [facility] is able to offer her a bed. Sent secure chat to MD to see if there is an anticipated discharge date yet.  1:17 pm: Per MD, patient not ready for discharge yet. Asked her to notify TOC 24-48 hours ahead of time so auth can be started. Updated daughter. She is hoping to move her dad there at the same time. [place] admissions coordinator will call her to coordinate.  Expected Discharge Plan: [place] Barriers to Discharge: Continued Medical Work up  Expected Discharge Plan and Services Expected Discharge Plan: [name] arrangements for the past 2 months: [place]                                       Social Determinants of Health (SDOH) Interventions    Readmission Risk Interventions No flowsheet data found.

## 2021-03-15 NOTE — Progress Notes (Signed)
Occupational Therapy Treatment Patient Details Name: [name] MRN: [id number] DOB: [birth year] Today's Date: 03/15/2021    History of present illness 70 y.o. female who presents emergency department for chief concerns of confusion. MRI reveals embolic pattern acute infarcts involving the infra and supratentorial brain and extensive chronic ischemic injury. PMH: hypertension, history of right trimalleolar ankle fracture, hypothyroid, history of DVT, hyperlipidemia, depression, mild cognitive decline, history of CVA, right PCA infarct.   OT comments  Pt seen for OT treatment on this date. Upon arrival to room, pt awake with nurse tech at bedside. Pt agreeable to OT tx. Pt A&Ox2 (self, place, and some aspects of situation). Pt pleasantly confused, stating that her cat present, however when asked if she could see the cat, pt denied. Pt impulsive at times, however easily re-directed following cues from OT for safety awareness. Pt currently presents with decreased awareness of deficits, and decreased strength, balance, and activity tolerance, and requires MOD A for supine<>sit transfers, MOD A for bed-level UB dressing, and MAX A for bed-level peri-care. Of note, following supine>sit transfer, pt c/o dizziness, however pt was unable to tolerate sitting EOB long enough to obtain BP before pt returned to supine (pt reported that dizziness was subsiding over time though). Pt is making good progress toward goals and continues to benefit from skilled OT services to maximize return to PLOF and minimize risk of future falls, injury, caregiver burden, and readmission. Will continue to follow POC. Discharge recommendation remains appropriate.    Follow Up Recommendations  SNF    Equipment Recommendations  None recommended by OT       Precautions / Restrictions Precautions Precautions: Fall Restrictions Weight Bearing Restrictions: Yes RLE Weight Bearing: Non weight bearing       Mobility Bed  Mobility Overal bed mobility: Needs Assistance Bed Mobility: Rolling;Supine to Sit;Sit to Supine Rolling: Min assist   Supine to sit: Mod assist Sit to supine: Mod assist   General bed mobility comments: Requires increased time, MOD A for trunk support, and multi-modal cues for sequencing Patient Response: Restless  Transfers                 General transfer comment: Unable to advance to OOB due to c/o dizziness.    Balance Overall balance assessment: Needs assistance Sitting-balance support: No upper extremity supported;Feet supported Sitting balance-Leahy Scale: Fair Sitting balance - Comments: MIN GUARD for sitting EOB d/t pt c/o dizziness                                   ADL either performed or assessed with clinical judgement   ADL Overall ADL's : Needs assistance/impaired     Grooming: Wash/dry hands;Set up;Bed level           Upper Body Dressing : Moderate assistance;Cueing for sequencing;Bed level Upper Body Dressing Details (indicate cue type and reason): To don/doff soiled hospital gown. Pt able to doff sleeves, however required MOD A and cues d/t pt difficulty sequencing task         Toileting- Clothing Manipulation and Hygiene: Maximal assistance;Bed level Toileting - Clothing Manipulation Details (indicate cue type and reason): Pt required MAX verbal cues to initiate frontal peri-care and MAX A to perform posterior peri-care.                       Cognition Arousal/Alertness: Awake/alert Behavior During Therapy: Restless Overall Cognitive Status:  Impaired/Different from baseline                                 General Comments: Pt A&Ox2 (self, place, and some aspects of situation). Pt pleasantly confused, stating that her cat present, however when asked if she could see the cat, pt denied. Pt impulsive at times, however easily re-directed following cues from OT for safety awareness. Required MOD verbal cues for  sequencing tasks throughout session              General Comments Following supine>sit transfer, pt c/o dizziness. Pt unable to tolerate sitting EOB long enough to obtain BP before pt returned to supine, however pt reported that dizziness was subsiding over time    Pertinent Vitals/ Pain       Pain Assessment: Faces Pain Score: 9  Faces Pain Scale: Hurts even more Pain Location: L Hip Pain Descriptors / Indicators: Discomfort;Guarding Pain Intervention(s): Limited activity within patient's tolerance;Monitored during session;Repositioned         Frequency  Min 1X/week        Progress Toward Goals  OT Goals(current goals can now be found in the care plan section)  Progress towards OT goals: Progressing toward goals  Acute Rehab OT Goals Patient Stated Goal: none stated OT Goal Formulation: With family Time For Goal Achievement: 03/27/21 Potential to Achieve Goals: Good  Plan Discharge plan remains appropriate;Frequency remains appropriate       AM-PAC OT "6 Clicks" Daily Activity     Outcome Measure   Help from another person eating meals?: A Little Help from another person taking care of personal grooming?: A Little Help from another person toileting, which includes using toliet, bedpan, or urinal?: A Lot Help from another person bathing (including washing, rinsing, drying)?: A Lot Help from another person to put on and taking off regular upper body clothing?: A Lot Help from another person to put on and taking off regular lower body clothing?: A Lot 6 Click Score: 14    End of Session    OT Visit Diagnosis: Muscle weakness (generalized) (M62.81);Other symptoms and signs involving cognitive function;Pain Pain - Right/Left: Left Pain - part of body: Leg   Activity Tolerance Patient tolerated treatment well   Patient Left in bed;with call bell/phone within reach;with bed alarm set   Nurse Communication Mobility status        Time: 6314-9702 OT Time  Calculation (min): 25 min  Charges: OT General Charges $OT Visit: 1 Visit OT Treatments $Self Care/Home Management : 23-37 mins  Fredirick Maudlin, OTR/L [place]

## 2021-03-15 NOTE — Progress Notes (Signed)
SLP Cancellation Note  Patient Details Name: [name] MRN: [id number] DOB: Apr 06, 1951   Cancelled treatment:       Reason Eval/Treat Not Completed:  (chart reviewed; no needs identified.). Met w/ pt and family member in room. Pt and family denied any swallowing deficits or need for further f/u. Pt is on a Regular consistency diet w/ general aspiration precautions. ST services will sign off w/ NSG to reconsult if any new needs arise during admit.      [name], MS, CCC-SLP Speech Language Pathologist Rehab Services [phone] [facility] 03/15/2021, 10:40 AM

## 2021-03-15 NOTE — Progress Notes (Signed)
Physical Therapy Treatment Patient Details Name: [name] MRN: [id number] DOB: March 17, 1951 Today's Date: 03/15/2021    History of Present Illness 70 y.o. female who presents emergency department for chief concerns of confusion. MRI reveals embolic pattern acute infarcts involving the infra and supratentorial brain and extensive chronic ischemic injury. PMH: hypertension, history of right trimalleolar ankle fracture, hypothyroid, history of DVT, hyperlipidemia, depression, mild cognitive decline, history of CVA, right PCA infarct.    PT Comments    Pt was asleep in long sitting upon arriving. She easily awakes and is pleasantly confused throughout session. Disoriented to time, location and situation. Was willing to trial OOB activity however unable to achieve due to severe dizziness upon sitting up. BP in sitting 160/84. She required mod assist to exit L side of bed with a lot of extra time and TCs/Vcs for sequencing and technique improvements. Pt's cognition/endurance/ and pain limited session progression. Highly recommend DC to SNF.     Follow Up Recommendations  SNF     Equipment Recommendations  None recommended by PT       Precautions / Restrictions Precautions Precautions: Fall Restrictions Weight Bearing Restrictions: Yes RLE Weight Bearing: Non weight bearing    Mobility  Bed Mobility Overal bed mobility: Needs Assistance Bed Mobility: Supine to Sit Rolling: Mod assist   Supine to sit: Mod assist     General bed mobility comments: Increased time to perform. Mod assit to exit L side. Pt sat EOB with CGA for safety. Does c/o dizziness and requested to return to supine quickly. BP in sitting 160/84    Transfers  General transfer comment: Unable to advance to OOB due to c/o dizziness.      Balance Overall balance assessment: Needs assistance Sitting-balance support: Feet supported Sitting balance-Leahy Scale: Fair Sitting balance - Comments: CGA for  safety       Cognition Arousal/Alertness: Awake/alert Behavior During Therapy: WFL for tasks assessed/performed;Anxious;Restless Overall Cognitive Status: Impaired/Different from baseline        General Comments: confused, however pleasant. A&O x 1. Only oriented self       Pertinent Vitals/Pain Pain Assessment: 0-10 Pain Score: 9  Faces Pain Scale: Hurts even more Pain Location: L Hip Pain Descriptors / Indicators: Discomfort;Guarding Pain Intervention(s): Limited activity within patient's tolerance;Monitored during session;Premedicated before session;Repositioned           PT Goals (current goals can now be found in the care plan section) Acute Rehab PT Goals Patient Stated Goal: none stated Progress towards PT goals: Progressing toward goals    Frequency    Min 2X/week      PT Plan Current plan remains appropriate       AM-PAC PT "6 Clicks" Mobility   Outcome Measure  Help needed turning from your back to your side while in a flat bed without using bedrails?: A Lot Help needed moving from lying on your back to sitting on the side of a flat bed without using bedrails?: A Lot Help needed moving to and from a bed to a chair (including a wheelchair)?: Total Help needed standing up from a chair using your arms (e.g., wheelchair or bedside chair)?: Total Help needed to walk in hospital room?: Total Help needed climbing 3-5 steps with a railing? : Total 6 Click Score: 8    End of Session   Activity Tolerance: Patient tolerated treatment well Patient left: in bed;with bed alarm set;with call bell/phone within reach Nurse Communication: Mobility status PT Visit Diagnosis: Muscle weakness (generalized) (  M62.81);Difficulty in walking, not elsewhere classified (R26.2)     Time: 2761-4709 PT Time Calculation (min) (ACUTE ONLY): 13 min  Charges:  $Therapeutic Activity: 8-22 mins                     Julaine Fusi PTA 03/15/21, 3:14 PM

## 2021-03-15 NOTE — Consult Note (Signed)
[place] for lovenox + warfarin  Indication: chest pain/ACS, embolic stroke, and antiphospholipid antibody  Allergies  Allergen Reactions  . Penicillin G Rash    Patient Measurements: Height: 5\' 8"  (172.7 cm) Weight: 83.5 kg (184 lb) IBW/kg (Calculated) : 63.9 Heparin Dosing Weight: 81 kg  Vital Signs: Temp: 97.6 F (36.4 C) (04/13 0424) BP: 121/67 (04/13 0424) Pulse Rate: 92 (04/13 0424)  Labs: Recent Labs    03/12/21 1044 03/12/21 1852 03/13/21 0402 03/13/21 0402 03/14/21 0457 03/15/21 0443  HGB  --   --  7.6*   < > 7.3* 7.9*  HCT  --   --  23.4*  --  22.0* 23.8*  PLT  --   --  78*  --  94* 131*  APTT >200* >160* 133*  --   --   --   LABPROT  --   --  16.9*  --  20.4* 29.8*  INR  --   --  1.4*  --  1.8* 2.8*  HEPARINUNFRC 1.67*  --  1.24*  --   --   --   CREATININE  --   --  1.77*  --  1.84* 1.82*   < > = values in this interval not displayed.    Estimated Creatinine Clearance: 33 mL/min (A) (by C-G formula based on SCr of 1.82 mg/dL (H)).   Medical History: Past Medical History:  Diagnosis Date  . Arthritis 04/02/1996  . Depression   . GERD (gastroesophageal reflux disease) 04/03/2019  . Hyperlipidemia   . Hypertension   . Renal insufficiency   . Stroke ([place])   . Thyroid disease     Medications:  Medications Prior to Admission  Medication Sig Dispense Refill Last Dose  . acetaminophen (TYLENOL) 325 MG tablet Take 2 tablets (650 mg total) by mouth every 4 (four) hours as needed for mild pain (or temp > 37.5 C (99.5 F)).   Unknown at PRN  . apixaban (ELIQUIS) 5 MG TABS tablet Take 1 tablet (5 mg total) by mouth 2 (two) times daily. 60 tablet  03/10/2021 at 1800  . ascorbic acid (VITAMIN C) 500 MG tablet Take 500 mg by mouth 2 (two) times daily.     Marland Kitchen atorvastatin (LIPITOR) 20 MG tablet Take 1 tablet (20 mg total) by mouth daily. (Patient taking differently: Take 20 mg by mouth every evening.)   03/10/2021 at 1800  .  escitalopram (LEXAPRO) 20 MG tablet Take 1 tablet (20 mg total) by mouth daily. 90 tablet 0 03/10/2021 at 0900  . famotidine (PEPCID) 20 MG tablet Take 20 mg by mouth daily.     . ferrous sulfate 325 (65 FE) MG EC tablet Take 1 tablet (325 mg total) by mouth daily with breakfast. (Patient taking differently: Take 325 mg by mouth at bedtime.) 30 tablet 3   . levothyroxine (SYNTHROID) 100 MCG tablet Take 1 tablet (100 mcg total) by mouth daily at 6 (six) AM.   03/10/2021 at 0600  . loperamide (IMODIUM) 2 MG capsule Take 1 capsule (2 mg total) by mouth every 6 (six) hours as needed for diarrhea or loose stools. 30 capsule 0 Unknown at PRN  . montelukast (SINGULAIR) 10 MG tablet TAKE 1 TABLET BY MOUTH EVERYDAY AT BEDTIME (Patient taking differently: Take 10 mg by mouth at bedtime. TAKE 1 TABLET BY MOUTH EVERYDAY AT BEDTIME) 90 tablet 3 03/10/2021 at 2000  . ondansetron (ZOFRAN-ODT) 4 MG disintegrating tablet Take 4 mg by mouth every 6 (  six) hours as needed for nausea or vomiting.   Unknown at PRN  . oxybutynin (DITROPAN XL) 15 MG 24 hr tablet Take 1 tablet (15 mg total) by mouth at bedtime.   03/10/2021 at 2000  . sodium chloride (OCEAN) 0.65 % SOLN nasal spray Place 2 sprays into both nostrils every 6 (six) hours.     . traMADol (ULTRAM) 50 MG tablet Take 50 mg by mouth every 6 (six) hours.     . traZODone (DESYREL) 100 MG tablet Take 1 tablet (100 mg total) by mouth at bedtime.   03/10/2021 at 2000  . vitamin B-12 (CYANOCOBALAMIN) 1000 MCG tablet Take 1,000 mcg by mouth daily.     Marland Kitchen buPROPion (WELLBUTRIN XL) 150 MG 24 hr tablet Take 1 tablet (150 mg total) by mouth daily. (Patient not taking: No sig reported) 30 tablet 0 Not Taking at Unknown time  . diclofenac Sodium (VOLTAREN) 1 % GEL Apply 2 g topically 4 (four) times daily as needed (left knee pain). (Patient not taking: No sig reported)   Not Taking at Unknown time  . neomycin-bacitracin-polymyxin (NEOSPORIN) OINT Apply 1 application topically 2 (two) times  daily. (Patient not taking: No sig reported)   Not Taking at Unknown time   Scheduled:  . atorvastatin  20 mg Oral QHS  . buPROPion  150 mg Oral Daily  . enoxaparin (LOVENOX) injection  1 mg/kg Subcutaneous Q12H  . escitalopram  20 mg Oral Daily  . famotidine  20 mg Oral Daily  . feeding supplement  237 mL Oral TID BM  . ferrous sulfate  325 mg Oral QHS  . levothyroxine  100 mcg Oral Q0600  . mouth rinse  15 mL Mouth Rinse BID  . melatonin  5 mg Oral QHS  . montelukast  10 mg Oral QHS  . multivitamin with minerals   Oral Daily  . oxybutynin  15 mg Oral QHS  . polyethylene glycol  17 g Oral BID  . senna-docusate  2 tablet Oral BID  . vitamin B-12  1,000 mcg Oral Daily  . Warfarin - Pharmacist Dosing Inpatient   Does not apply q1600   Infusions:   PRN: haloperidol, ondansetron **OR** ondansetron (ZOFRAN) IV, oxyCODONE, sodium phosphate, traZODone Anti-infectives (From admission, onward)   Start     Dose/Rate Route Frequency Ordered Stop   03/12/21 1000  cefTRIAXone (ROCEPHIN) 1 g in sodium chloride 0.9 % 100 mL IVPB  Status:  Discontinued        1 g 200 mL/hr over 30 Minutes Intravenous Every 24 hours 03/11/21 2223 03/13/21 1402   03/11/21 2315  azithromycin (ZITHROMAX) 500 mg in sodium chloride 0.9 % 250 mL IVPB  Status:  Discontinued        500 mg 250 mL/hr over 60 Minutes Intravenous Every 24 hours 03/11/21 2223 03/12/21 0842   03/11/21 1100  cefTRIAXone (ROCEPHIN) 1 g in sodium chloride 0.9 % 100 mL IVPB        1 g 200 mL/hr over 30 Minutes Intravenous  Once 03/11/21 1056 03/11/21 1131      Assessment: Pharmacy consulted for ACS. Trop elevated at 543.  She was on Coumadin prior to her stroke, and while she was inpatient in [facility] acute rehab her consulting physician switched her over to Eliquis. Now, medical team is switching patient back to warfarin. Patient was on prescribed warfarin prior to apixaban for antiphospholipid antibody syndrome. Prior notes state goal INR  was 2.5 to 3.5. Per  Dr. Ubaldo Glassing, recommended  to have goal INR on the lower end of 2 to 3, so will aim for this while inpatient. DDI: APAP (increase bleeding), escitalopram (increase bleeding), hypothroidism (decreases effect of warfarin). NO major DDIs.   Home regimen in January 2022 was warfarin 2mg : Tu,Th, Sa,Su Warfarin 3mg : M,W,F.   Date INR Warfarin Dose/Comment  4/8 -- Last eliquis dose 4/8 @1800   4/9 1.6* **  4/10 -- 2 mg**  4/11 1.4 4 mg**  4/12 1.8 3 mg  4/13 2.8 Hold x1 (0 mg)  * Baseline INR elevated (1.6 on admit) likely ISO eliquis PTA (last dose 4/8 1800). **Pt received CTX (4/9-11) & azith (4/9-10).   Goal of Therapy:  INR: 2 - 3. (while inpatient; PTA was 2.5-3.5) Monitor platelets by anticoagulation protocol: Yes    Plan:  Enoxaparin:  Held enoxaparin bridge with today's therapeutic INR . Hgb up slightly 7.3>7.9 . Platelets typically below 100 (94>131). Will continue to monitor.  Warfarin:    INR became rapidly therapeutic 1.8>2.8. Will hold warfarin x1 tonight.  Daily INR ordered.  Lorna Dibble, PharmD Clinical Pharmacist 03/15/2021 7:32 AM

## 2021-03-15 NOTE — Progress Notes (Signed)
PROGRESS NOTE    [name]  SPQ:[id number] DOB: October 25, 1951 DOA: 03/11/2021 PCP: Juline Patch, MD    Brief Narrative:  70 year old female with history of hypertension, hypothyroidism, history of DVT and antiphospholipid syndrome who was previously on Coumadin for 15 years and recently changed to Eliquis, chronic kidney disease stage IIIb with baseline creatinine 1.8, hyperlipidemia, depression, cognitive decline, recent history of stroke and rehab, recent right ankle fracture brought back from nursing home with altered mental status.  Recent hospitalization for stroke, rehab and discharged to the nursing home.  She has been remaining in poor health since then, appetite is poor.  Patient was complaining of midsternal chest pain for about 2 days.  Both spouse at long-term nursing home now. In the emergency room, patient is confused.  Hemodynamically stable.  Sodium 132.  Creatinine 2.03 mildly elevated from baseline otherwise most of the findings negative.  COVID-19 negative.  UA consistent with large leukocytes.  CT head with multiple previous infarctions but no acute findings. Patient was ultimately found to have cardioembolic stroke.  4/13-still with left quadrant pain.  Per daughter patient is relating in pain earlier would like to increase pain medication.  Did discuss increasing pain meds slowly as it can alter her mental status and they verbalized understanding. INR 2.8.  Per daughter patient is only eating a few bites  Assessment & Plan:   Active Problems:   Adult hypothyroidism   Essential hypertension   Anticoagulant long-term use   Antiphospholipid antibody syndrome (HCC)   Stage 3 chronic kidney disease (HCC)   Thrombocytopenia (HCC)   CVA (cerebral vascular accident) ([place])   Ischemic cerebrovascular accident (CVA) of frontal lobe (HCC)   Embolism of left anterior cerebral artery   AKI (acute kidney injury) ([place])   Pressure injury of skin   Encephalopathy    Normocytic anemia  Acute metabolic encephalopathy in a patient with multiple medical issues: Developing vascular dementia and cognition deficits. Currently hemodynamically stable.  Neurologically stable.  Found to have acute a stroke, see below. Minimize benzodiazepines and opiates.  Patient has inconsistent complaints and easily reoriented. Delirium precautions.  Sleep-wake cycle regulation. 4/13-appears to be at baseline improved  Suspected UTI: Patient with recurrent UTI.  Urinalysis abnormal.  Previous Citrobacter.  Procalcitonin very high with no other explanation of infection.  4/12-was given rocephin. ucx with 20,000 diptheroids only.  Rocephin was d/c'd  4/13 since white count up will repeat UCx   Elevated troponin- Echo with EF of 50 to 55%.  Grade 2 diastolic dysfunction.  No regional wall motion abnormality.  12/13-cardiology following  Candidate for invasive cardiac evaluation due to multiple comorbidities including recent CVA, ARI Recommended conservative management     Acute kidney injury on chronic kidney disease stage IIIb: Patient was treated with IV fluids.  Renal functions improved.  Her baseline creatinine reported about 1.8.   4/13 remained stable Continue to monitor Resuming IV fluid as she is on the dry side and having decreased p.o. intake   Antiphospholipid antibody syndrome/hypercoagulable condition/recurrent cardioembolic stroke: Repeat MRI showed multiple cardioembolic showering embolism.  No new neurological deficit. Diagnosed antiphospholipid antibody syndrome, changed to Eliquis 3 months ago.  Will treat as failure of Eliquis as she was consistently taking this at nursing home. Patient on heparin, changed to Lovenox today.  Coumadin started 4/10, long-term management should be with Coumadin.  Patient is going to be in a supervised living condition now, will be able to keep up on Coumadin. Sinus rhythm and  rate controlled. Neurology following,  PT/OT/speech.  Referred to a skilled nursing rehab. 4/13 -May need outpatient evaluation by cardiology for possible PAF  INR therapeutic at 2.8 today.  Pharmacy monitoring dosing  DC Lovenox    Right ankle fracture: Conservative management.  Nonweightbearing on cast.  Hypothyroidism: On Synthroid that we will continue.  TSH normal.  Anemia of chronic disease/normocytic anemia:   Iron and ferritin level were normal.  B12 level is adequate and on replacement.   Stool occult negative May need screening exam including EGD and colonoscopy, however with acute embolic stroke may postpone the procedure. 4/13-hematology consulted input was appreciated.-Checking anemia work-up including folate level, reticulocyte count, LDH, B1 and B6, copper, haptoglobin and multiple myeloma panel B12 level normal, folate normal, LDH elevated Hemoglobin stable at 7.9 this a.m. Doubt splenic hematoma causing the drop in hemoglobin  Subcapsular Splenic hematoma with unknown chronicity. Hg stable. Doubt cause of drop in Hg Plan to monitor , repeat US in few weeks. Monitor h/h No surgical intervention CT abd done please see result.  Adrenal nodule- f/u ct /MRI in 12 months is recommended to rule out neoplasm. Was found on CT scan.     DVT prophylaxis:  Coumadin.   Code Status: DNR Family Communication: Patient's daughter at the bedside Disposition Plan: Status is: Inpatient  Remains inpatient appropriate because:Altered mental status, IV treatments appropriate due to intensity of illness or inability to take PO and Inpatient level of care appropriate due to severity of illness   Dispo: The patient is from: SNF              Anticipated d/c is to: SNF              Patient currently is not medically stable to d/c.   Difficult to place patient No  Consultants:   Cardiology  Neurology  Hematology  GSX  Procedures:   None  Antimicrobials:   Rocephin 4/9---4/11   Subjective: Still with LQ  pain. No other issues  Objective: Vitals:   03/14/21 2014 03/15/21 0003 03/15/21 0424 03/15/21 0904  BP: (!) 150/91 (!) 147/92 121/67 (!) 146/92  Pulse: (!) 108 (!) 103 92 (!) 107  Resp: '18 20 18 18  ' Temp: 97.9 F (36.6 C) 97.6 F (36.4 C) 97.6 F (36.4 C) 98.5 F (36.9 C)  TempSrc:      SpO2: 98% 100% 97% 99%  Weight:      Height:        Intake/Output Summary (Last 24 hours) at 03/15/2021 1230 Last data filed at 03/15/2021 1107 Gross per 24 hour  Intake 120 ml  Output 300 ml  Net -180 ml   Filed Weights   03/11/21 0921  Weight: 83.5 kg    Examination: Calm, NAD CTA no wheeze rales rhonchi's Regular S1-S2 mildly tacky no gallops Soft left quadrant mildly tender no rebound or guarding positive bowel sounds No edema Awake alert and oriented x2, not to place today Mood and affect appropriate in current setting   Data Reviewed: I have personally reviewed following labs and imaging studies  CBC: Recent Labs  Lab 03/11/21 0926 03/12/21 0553 03/13/21 0402 03/14/21 0457 03/15/21 0443  WBC 8.5 7.3 9.5 8.5 12.5*  NEUTROABS 6.3  --  7.3 5.7 9.7*  HGB 8.6* 7.6* 7.6* 7.3* 7.9*  HCT 26.4* 24.0* 23.4* 22.0* 23.8*  MCV 94.6 96.8 95.1 95.2 95.6  PLT 104* 80* 78* 94* 470*   Basic Metabolic Panel: Recent Labs  Lab 03/11/21 0926 03/12/21  2992 03/13/21 0402 03/14/21 0457 03/15/21 0443  NA 132* 137 136 136 135  K 3.9 4.1 4.0 3.7 3.5  CL 101 103 104 104 101  CO2 '25 24 24 25 24  ' GLUCOSE 112* 88 109* 98 109*  BUN 35* 31* 26* 21 20  CREATININE 2.03* 1.80* 1.77* 1.84* 1.82*  CALCIUM 10.0 9.5 9.7 9.4 9.8  MG  --   --  1.8  --   --   PHOS  --   --  3.5  --   --    GFR: Estimated Creatinine Clearance: 33 mL/min (A) (by C-G formula based on SCr of 1.82 mg/dL (H)). Liver Function Tests: Recent Labs  Lab 03/11/21 0926  AST 46*  ALT 44  ALKPHOS 262*  BILITOT 0.9  PROT 7.2  ALBUMIN 2.7*   No results for input(s): LIPASE, AMYLASE in the last 168 hours. Recent Labs   Lab 03/11/21 1323  AMMONIA <9*   Coagulation Profile: Recent Labs  Lab 03/11/21 1507 03/13/21 0402 03/14/21 0457 03/15/21 0443  INR 1.6* 1.4* 1.8* 2.8*   Cardiac Enzymes: No results for input(s): CKTOTAL, CKMB, CKMBINDEX, TROPONINI in the last 168 hours. BNP (last 3 results) No results for input(s): PROBNP in the last 8760 hours. HbA1C: Recent Labs    03/14/21 0457  HGBA1C 5.1   CBG: Recent Labs  Lab 03/12/21 1347  GLUCAP 89   Lipid Profile: Recent Labs    03/14/21 0457  CHOL 111  HDL 38*  LDLCALC 53  TRIG 99  CHOLHDL 2.9   Thyroid Function Tests: No results for input(s): TSH, T4TOTAL, FREET4, T3FREE, THYROIDAB in the last 72 hours. Anemia Panel: Recent Labs    03/15/21 0443  FOLATE 11.9  RETICCTPCT 4.5*   Sepsis Labs: Recent Labs  Lab 03/11/21 1323 03/12/21 0553 03/13/21 0402 03/15/21 0443  PROCALCITON 59.38 46.36 26.80 6.74    Recent Results (from the past 240 hour(s))  Urine Culture     Status: Abnormal   Collection Time: 03/11/21  9:26 AM   Specimen: Urine, Random  Result Value Ref Range Status   Specimen Description   Final    URINE, RANDOM Performed at [facility], [address] [place], [place] [address]    Special Requests   Final    NONE Performed at [facility], [address], [place], [place] [address]    Culture (A)  Final    20,000 COLONIES/mL DIPHTHEROIDS(CORYNEBACTERIUM SPECIES) Standardized susceptibility testing for this organism is not available. Performed at [facility], [place] [address] [place], [place] [address]    Report Status 03/12/2021 FINAL  Final  SARS CORONAVIRUS 2 (TAT 6-24 HRS) Nasopharyngeal Nasopharyngeal Swab     Status: None   Collection Time: 03/11/21 11:05 AM   Specimen: Nasopharyngeal Swab  Result Value Ref Range Status   SARS Coronavirus 2 NEGATIVE NEGATIVE Final    Comment: (NOTE) SARS-CoV-2 target nucleic acids are NOT DETECTED.  The SARS-CoV-2 RNA is generally  detectable in upper and lower respiratory specimens during the acute phase of infection. Negative results do not preclude SARS-CoV-2 infection, do not rule out co-infections with other pathogens, and should not be used as the sole basis for treatment or other patient management decisions. Negative results must be combined with clinical observations, patient history, and epidemiological information. The expected result is Negative.  Fact Sheet for Patients: SugarRoll.be  Fact Sheet for Healthcare Providers: https://www.woods-mathews.com/  This test is not yet approved or cleared by the [place]  FDA and  has been authorized for detection and/or diagnosis of SARS-CoV-2 by FDA under an Emergency Use Authorization (EUA). This EUA will remain  in effect (meaning this test can be used) for the duration of the COVID-19 declaration under Se ction 564(b)(1) of the Act, 21 U.S.C. section 360bbb-3(b)(1), unless the authorization is terminated or revoked sooner.  Performed at [facility], [place] [address] [place], [place] [address]          Radiology Studies: CT ABDOMEN PELVIS WO CONTRAST  Result Date: 03/14/2021 CLINICAL DATA:  Left upper quadrant abdominal pain. EXAM: CT ABDOMEN AND PELVIS WITHOUT CONTRAST TECHNIQUE: Multidetector CT imaging of the abdomen and pelvis was performed following the standard protocol without IV contrast. COMPARISON:  Ultrasound of same day. CT of March 11, 2021 and January 09, 2021. FINDINGS: Lower chest: Small left pleural effusion is noted with minimal adjacent subsegmental atelectasis. Hepatobiliary: No focal liver abnormality is seen. Status post cholecystectomy. No biliary dilatation. Pancreas: Unremarkable. No pancreatic ductal dilatation or surrounding inflammatory changes. Spleen: Stable size and appearance of oval-shaped low density involving inferior and anterior portion of the spleen most consistent with  subcapsular hematoma. Infarction is felt to be less likely. Adrenals/Urinary Tract: 2 cm right adrenal nodule is noted which is unchanged compared to prior exams. Left adrenal gland is unremarkable. Bilateral renal cortical thinning and scarring is noted. No hydronephrosis or renal obstruction is noted. No renal or ureteral calculi are noted. Urinary bladder is unremarkable. Stomach/Bowel: The stomach and appendix appear normal. There is no evidence of bowel obstruction. No inflammation is noted. Large amount of stool is noted in the distal sigmoid colon and rectum concerning for impaction. Vascular/Lymphatic: Aortic atherosclerosis. No enlarged abdominal or pelvic lymph nodes. Reproductive: Uterus and bilateral adnexa are unremarkable. Other: No abdominal wall hernia or abnormality. No abdominopelvic ascites. Musculoskeletal: No acute or significant osseous findings. IMPRESSION: Grossly stable size and appearance of oval-shaped low density involving inferior anterior portion of the spleen most consistent with subcapsular hematoma. Infarction is felt to be less likely. As recommended by ultrasound exam today, follow-up ultrasound in several weeks is recommended to ensure stability or resolution of this abnormality. These results were called by telephone at the time of interpretation on 03/14/2021 at 6:17 pm to provider [facility] , who verbally acknowledged these results. 2 cm right adrenal nodule is noted. Follow-up CT or MRI in 12 months is recommended to rule out neoplasm. Large amount of stool is noted in the distal sigmoid colon and rectum concerning for impaction. Aortic Atherosclerosis (ICD10-I70.0). Electronically Signed   By: Marijo Conception M.D.   On: 03/14/2021 18:17   ECHOCARDIOGRAM COMPLETE  Result Date: 03/14/2021    ECHOCARDIOGRAM REPORT   Patient Name:   ANNIEBELL BEDORE Date of Exam: 03/14/2021 Medical Rec #:  [id number]          Height:       68.0 in Accession #:    [id number]          Weight:       184.0 lb Date of Birth:  01-22-1951         BSA:          1.973 m Patient Age:    84 years           BP:           147/76 mmHg Patient Gender: F  HR:           101 bpm. Exam Location:  [facility] Procedure: 2D Echo, Cardiac Doppler and Color Doppler Indications:     Stroke I63.9  History:         Patient has prior history of Echocardiogram examinations, most                  recent 12/27/2020. Stroke; Risk Factors:Hypertension.  Sonographer:     Sherrie Sport RDCS (AE) Referring Phys:  [id number] Barb Merino Diagnosing Phys: Isaias Cowman MD IMPRESSIONS  1. Left ventricular ejection fraction, by estimation, is 50 to 55%. The left ventricle has low normal function. The left ventricle has no regional wall motion abnormalities. Left ventricular diastolic parameters are consistent with Grade II diastolic dysfunction (pseudonormalization).  2. Right ventricular systolic function is normal. The right ventricular size is normal.  3. The mitral valve is normal in structure. Mild to moderate mitral valve regurgitation. No evidence of mitral stenosis.  4. The aortic valve is normal in structure. Aortic valve regurgitation is mild. No aortic stenosis is present.  5. The inferior vena cava is normal in size with greater than 50% respiratory variability, suggesting right atrial pressure of 3 mmHg. FINDINGS  Left Ventricle: Left ventricular ejection fraction, by estimation, is 50 to 55%. The left ventricle has low normal function. The left ventricle has no regional wall motion abnormalities. The left ventricular internal cavity size was normal in size. There is no left ventricular hypertrophy. Left ventricular diastolic parameters are consistent with Grade II diastolic dysfunction (pseudonormalization). Right Ventricle: The right ventricular size is normal. No increase in right ventricular wall thickness. Right ventricular systolic function is normal. Left Atrium: Left atrial size was normal in size.  Right Atrium: Right atrial size was normal in size. Pericardium: There is no evidence of pericardial effusion. Mitral Valve: The mitral valve is normal in structure. Mild to moderate mitral valve regurgitation. No evidence of mitral valve stenosis. Tricuspid Valve: The tricuspid valve is normal in structure. Tricuspid valve regurgitation is mild . No evidence of tricuspid stenosis. Aortic Valve: The aortic valve is normal in structure. Aortic valve regurgitation is mild. No aortic stenosis is present. Aortic valve mean gradient measures 6.0 mmHg. Aortic valve peak gradient measures 10.0 mmHg. Aortic valve area, by VTI measures 1.78  cm. Pulmonic Valve: The pulmonic valve was normal in structure. Pulmonic valve regurgitation is not visualized. No evidence of pulmonic stenosis. Aorta: The aortic root is normal in size and structure. Venous: The inferior vena cava is normal in size with greater than 50% respiratory variability, suggesting right atrial pressure of 3 mmHg. IAS/Shunts: No atrial level shunt detected by color flow Doppler.  LEFT VENTRICLE PLAX 2D LVIDd:         4.40 cm  Diastology LVIDs:         3.26 cm  LV e' medial:    6.64 cm/s LV PW:         0.98 cm  LV E/e' medial:  10.3 LV IVS:        0.93 cm  LV e' lateral:   5.00 cm/s LVOT diam:     2.00 cm  LV E/e' lateral: 13.7 LV SV:         60 LV SV Index:   30 LVOT Area:     3.14 cm  RIGHT VENTRICLE RV Basal diam:  2.97 cm RV S prime:     9.57 cm/s TAPSE (M-mode): 3.1 cm LEFT ATRIUM  Index       RIGHT ATRIUM           Index LA diam:        4.00 cm 2.03 cm/m  RA Area:     13.90 cm LA Vol (A2C):   60.5 ml 30.67 ml/m RA Volume:   31.30 ml  15.87 ml/m LA Vol (A4C):   62.6 ml 31.73 ml/m LA Biplane Vol: 66.5 ml 33.71 ml/m  AORTIC VALVE                    PULMONIC VALVE AV Area (Vmax):    1.41 cm     PV Vmax:        0.75 m/s AV Area (Vmean):   1.29 cm     PV Peak grad:   2.2 mmHg AV Area (VTI):     1.78 cm     RVOT Peak grad: 2 mmHg AV Vmax:            158.33 cm/s AV Vmean:          116.000 cm/s AV VTI:            0.337 m AV Peak Grad:      10.0 mmHg AV Mean Grad:      6.0 mmHg LVOT Vmax:         71.10 cm/s LVOT Vmean:        47.600 cm/s LVOT VTI:          0.191 m LVOT/AV VTI ratio: 0.57  AORTA Ao Root diam: 2.83 cm MITRAL VALVE                TRICUSPID VALVE MV Area (PHT): 3.60 cm     TR Peak grad:   33.2 mmHg MV Decel Time: 211 msec     TR Vmax:        288.00 cm/s MV E velocity: 68.60 cm/s MV A velocity: 117.00 cm/s  SHUNTS MV E/A ratio:  0.59         Systemic VTI:  0.19 m                             Systemic Diam: 2.00 cm Isaias Cowman MD Electronically signed by Isaias Cowman MD Signature Date/Time: 03/14/2021/1:55:06 PM    Final    US ABDOMINAL PELVIC ART/VENT FLOW DOPPLER LIMITED  Result Date: 03/14/2021 CLINICAL DATA:  Evaluate splenic abnormality on recent CT. EXAM: ULTRASOUND ABDOMEN LIMITED WITH DOPPLER TECHNIQUE: Images were obtained at the area of concern at the spleen. COMPARISON:  CT of the abdomen and pelvis 03/11/2021 FINDINGS: Spleen measures 11.8 x 5.3 x 12.7 cm. Calculated splenic volume is 397 mL. Superior and mid portions of the spleen have normal echotexture. Along the inferior aspect of the spleen there is fluid surrounding a solid structure which has similar echogenicity to the spleen. Suspect this is fluid collection around a small area of abnormal splenic tissue or fluid surrounding a splenic hematoma. These findings correspond with the recent CT findings. This area of concern with surrounding fluid measures 4.1 x 4.0 x 4.5 cm. Arterial and venous flow at the splenic hilum. No significant internal vascularity within the tissue surrounded by fluid. IMPRESSION: Complex fluid collection along the inferior aspect of spleen corresponding with the abnormality on the recent CT. The echogenic material surrounded by fluid could represent abnormal splenic tissue versus hematoma. The fluid collection and CT findings are more  suggestive for  a hematoma due to a splenic laceration rather than an infarct or abscess. Consider a follow-up ultrasound in 6-8 weeks to see if the fluid collection resolves. Electronically Signed   By: Markus Daft M.D.   On: 03/14/2021 13:23   US SPLEEN (ABDOMEN LIMITED)  Result Date: 03/14/2021 CLINICAL DATA:  Evaluate splenic abnormality on recent CT. EXAM: ULTRASOUND ABDOMEN LIMITED WITH DOPPLER TECHNIQUE: Images were obtained at the area of concern at the spleen. COMPARISON:  CT of the abdomen and pelvis 03/11/2021 FINDINGS: Spleen measures 11.8 x 5.3 x 12.7 cm. Calculated splenic volume is 397 mL. Superior and mid portions of the spleen have normal echotexture. Along the inferior aspect of the spleen there is fluid surrounding a solid structure which has similar echogenicity to the spleen. Suspect this is fluid collection around a small area of abnormal splenic tissue or fluid surrounding a splenic hematoma. These findings correspond with the recent CT findings. This area of concern with surrounding fluid measures 4.1 x 4.0 x 4.5 cm. Arterial and venous flow at the splenic hilum. No significant internal vascularity within the tissue surrounded by fluid. IMPRESSION: Complex fluid collection along the inferior aspect of spleen corresponding with the abnormality on the recent CT. The echogenic material surrounded by fluid could represent abnormal splenic tissue versus hematoma. The fluid collection and CT findings are more suggestive for a hematoma due to a splenic laceration rather than an infarct or abscess. Consider a follow-up ultrasound in 6-8 weeks to see if the fluid collection resolves. Electronically Signed   By: Markus Daft M.D.   On: 03/14/2021 13:23        Scheduled Meds: . atorvastatin  20 mg Oral QHS  . buPROPion  150 mg Oral Daily  . escitalopram  20 mg Oral Daily  . famotidine  20 mg Oral Daily  . feeding supplement  237 mL Oral TID BM  . ferrous sulfate  325 mg Oral QHS  .  levothyroxine  100 mcg Oral Q0600  . lidocaine  1 patch Transdermal Q24H  . mouth rinse  15 mL Mouth Rinse BID  . melatonin  5 mg Oral QHS  . montelukast  10 mg Oral QHS  . multivitamin with minerals   Oral Daily  . oxybutynin  15 mg Oral QHS  . polyethylene glycol  17 g Oral BID  . senna-docusate  2 tablet Oral BID  . sodium phosphate  1 enema Rectal Once  . vitamin B-12  1,000 mcg Oral Daily  . Warfarin - Pharmacist Dosing Inpatient   Does not apply q1600   Continuous Infusions:    LOS: 3 days    Time spent: 45 minutes with > 50% on coc   Nolberto Hanlon, MD [facility] Pager [phone]

## 2021-03-15 NOTE — Consult Note (Signed)
[facility] Vascular Consult Note  MRN : [id number]  [name] is a 70 y.o. ([birth year]) female who presents with chief complaint of  Chief Complaint  Patient presents with  . Altered Mental Status   History of Present Illness: 70 y.o. female presented to [facility] ED initially on 04/09 for evaluation of AMS. At the time of presentation, patient's daughter had provided much of the history and reported a significant decline in the patient's functional status in the last month but acutely worse in the days leading up to presentation including confusion and agitation. Of note, she has a history of admission earlier in January for CVA while on coumadin and was transitioned to Eliquis. Additionally, she had subsequent presention on 03/23 for trimalleolar fracture managed non-operatively as an outpatient. Last week, she was treated for UTI as well with 3 days of Keflex. History at presentation limited secondary to AMS. Work up in the ED on 04/09 revealed normal WBC to 8.5, Hgb low at 8.6, AKI with sCr - 2.03, UCx grew out Diphtheroids, INR found to be 1.6, and CT Renal stone was concerning for possible splenic hematoma. CT Head and subsequent Brain MRI were concerning for chronic embolic injuries. She was ultimately admitted to the medicine service.   Since admission, she has been transitioned back to coumadin as she is failing Eliquis as an outpatient. She has been seen by cardiology and pending hematology evaluation. Her Hgb has continued to trend downward throughout admission, most recent trend: 8.6 --> 7.6 --> 7.6 --> 7.4 in the last 3 days. Her renal function has improved mildly with sCr - 1.84. She did have splenic US today (04/12) which was concerning for possible for splenic hematoma. Patient not able to contribute any reliable history secondary to mental acuity. I was able to speak to her daughter, Arabelle Bollig ([place]), on the phone. She endorses her mother had been  complaining of severe left sided abdominal pain the last three days including the day of admission. This is typically resolved with narcotics, as it is now, but quickly returns. She denied any known falls or trauma since her fall in late March.   Vascular surgery was consulted by Dr. Kurtis Bushman for possible vascular intervention.  Current Facility-Administered Medications  Medication Dose Route Frequency Provider Last Rate Last Admin  . 0.45 % sodium chloride infusion   Intravenous Continuous Nolberto Hanlon, MD      . atorvastatin (LIPITOR) tablet 20 mg  20 mg Oral QHS Cox, Amy N, DO   20 mg at 03/14/21 2059  . buPROPion (WELLBUTRIN XL) 24 hr tablet 150 mg  150 mg Oral Daily Cox, Amy N, DO   150 mg at 03/15/21 1205  . escitalopram (LEXAPRO) tablet 20 mg  20 mg Oral Daily Cox, Amy N, DO   20 mg at 03/15/21 1206  . famotidine (PEPCID) tablet 20 mg  20 mg Oral Daily Cox, Amy N, DO   20 mg at 03/15/21 1205  . feeding supplement (ENSURE ENLIVE / ENSURE PLUS) liquid 237 mL  237 mL Oral TID BM Barb Merino, MD   237 mL at 03/15/21 1335  . ferrous sulfate tablet 325 mg  325 mg Oral QHS Cox, Amy N, DO   325 mg at 03/14/21 2058  . haloperidol (HALDOL) tablet 2 mg  2 mg Oral Q6H PRN Barb Merino, MD   2 mg at 03/14/21 2059  . levothyroxine (SYNTHROID) tablet 100 mcg  100 mcg Oral Q0600 Cox, Amy N,  DO   100 mcg at 03/15/21 0610  . lidocaine (LIDODERM) 5 % 1 patch  1 patch Transdermal Q24H Nolberto Hanlon, MD   1 patch at 03/15/21 1206  . MEDLINE mouth rinse  15 mL Mouth Rinse BID Cox, Amy N, DO   15 mL at 03/14/21 1435  . melatonin tablet 5 mg  5 mg Oral QHS Cox, Amy N, DO   5 mg at 03/14/21 2059  . montelukast (SINGULAIR) tablet 10 mg  10 mg Oral QHS Cox, Amy N, DO   10 mg at 03/14/21 2059  . multivitamin with minerals tablet   Oral Daily Cox, Amy N, DO   1 tablet at 03/15/21 1205  . ondansetron (ZOFRAN) tablet 4 mg  4 mg Oral Q6H PRN Cox, Amy N, DO       Or  . ondansetron (ZOFRAN) injection 4 mg  4 mg  Intravenous Q6H PRN Cox, Amy N, DO      . oxybutynin (DITROPAN-XL) 24 hr tablet 15 mg  15 mg Oral QHS Cox, Amy N, DO   15 mg at 03/14/21 2100  . oxyCODONE (Oxy IR/ROXICODONE) immediate release tablet 5 mg  5 mg Oral Q6H PRN Nolberto Hanlon, MD   5 mg at 03/15/21 1206  . polyethylene glycol (MIRALAX / GLYCOLAX) packet 17 g  17 g Oral BID Nolberto Hanlon, MD   17 g at 03/15/21 1207  . senna-docusate (Senokot-S) tablet 2 tablet  2 tablet Oral BID Nolberto Hanlon, MD   2 tablet at 03/15/21 1205  . sodium phosphate (FLEET) 7-19 GM/118ML enema 1 enema  1 enema Rectal Daily PRN Nolberto Hanlon, MD      . sodium phosphate (FLEET) 7-19 GM/118ML enema 1 enema  1 enema Rectal Once Nolberto Hanlon, MD      . traZODone (DESYREL) tablet 100 mg  100 mg Oral QHS PRN Cox, Amy N, DO   100 mg at 03/14/21 2059  . vitamin B-12 (CYANOCOBALAMIN) tablet 1,000 mcg  1,000 mcg Oral Daily Cox, Amy N, DO   1,000 mcg at 03/15/21 1000  . Warfarin - Pharmacist Dosing Inpatient   Does not apply [id number] [facility]   Given at 03/12/21 1850   Past Medical History:  Diagnosis Date  . Arthritis 04/02/1996  . Depression   . GERD (gastroesophageal reflux disease) 04/03/2019  . Hyperlipidemia   . Hypertension   . Renal insufficiency   . Stroke ([place])   . Thyroid disease    Past Surgical History:  Procedure Laterality Date  . CESAREAN SECTION    . CHOLECYSTECTOMY    . COLONOSCOPY  2012   repeat in 10 yrs  . KNEE ARTHROSCOPY W/ MENISCAL REPAIR Left    Social History Social History   Tobacco Use  . Smoking status: Never Smoker  . Smokeless tobacco: Never Used  . Tobacco comment: none  Vaping Use  . Vaping Use: Never used  Substance Use Topics  . Alcohol use: Yes    Alcohol/week: 1.0 standard drink    Types: 1 Glasses of wine per week    Comment: occasional drink  . Drug use: No   Family History Family History  Problem Relation Age of Onset  . Stroke Father   . Breast cancer Maternal Grandmother   . Kidney cancer  Mother   . Cancer Mother   . Arthritis Brother   Denies family history of peripheral artery disease, venous disease.  Patient does have a positive history for kidney cancer.  Allergies  Allergen Reactions  . Penicillin G Rash   REVIEW OF SYSTEMS (Negative unless checked)  Constitutional: [] Weight loss  [] Fever  [] Chills Cardiac: [] Chest pain   [] Chest pressure   [] Palpitations   [] Shortness of breath when laying flat   [] Shortness of breath at rest   [] Shortness of breath with exertion. Vascular:  [] Pain in legs with walking   [] Pain in legs at rest   [] Pain in legs when laying flat   [] Claudication   [] Pain in feet when walking  [] Pain in feet at rest  [] Pain in feet when laying flat   [] History of DVT   [] Phlebitis   [] Swelling in legs   [] Varicose veins   [] Non-healing ulcers Pulmonary:   [] Uses home oxygen   [] Productive cough   [] Hemoptysis   [] Wheeze  [] COPD   [] Asthma Neurologic:  [] Dizziness  [] Blackouts   [] Seizures   [] History of stroke   [] History of TIA  [] Aphasia   [] Temporary blindness   [] Dysphagia   [] Weakness or numbness in arms   [] Weakness or numbness in legs Musculoskeletal:  [] Arthritis   [] Joint swelling   [] Joint pain   [] Low back pain Hematologic:  [] Easy bruising  [] Easy bleeding   [] Hypercoagulable state   [x] Anemic  [] Hepatitis Gastrointestinal:  [] Blood in stool   [] Vomiting blood  [] Gastroesophageal reflux/heartburn   [] Difficulty swallowing. Genitourinary:  [x] Chronic kidney disease   [] Difficult urination  [] Frequent urination  [] Burning with urination   [] Blood in urine Skin:  [] Rashes   [] Ulcers   [] Wounds Psychological:  [] History of anxiety   []  History of major depression.   Physical Examination  Vitals:   03/14/21 2014 03/15/21 0003 03/15/21 0424 03/15/21 0904  BP: (!) 150/91 (!) 147/92 121/67 (!) 146/92  Pulse: (!) 108 (!) 103 92 (!) 107  Resp: 18 20 18 18   Temp: 97.9 F (36.6 C) 97.6 F (36.4 C) 97.6 F (36.4 C) 98.5 F (36.9 C)  TempSrc:       SpO2: 98% 100% 97% 99%  Weight:      Height:       Body mass index is 27.98 kg/m. Gen:  WD/WN, NAD Head: /AT, No temporalis wasting. Prominent temp pulse not noted. Ear/Nose/Throat: Hearing grossly intact, nares w/o erythema or drainage, oropharynx w/o Erythema/Exudate Eyes: Sclera non-icteric, conjunctiva clear Neck: Trachea midline.  No JVD.  Pulmonary:  Good air movement, respirations not labored, equal bilaterally.  Cardiac: RRR, normal S1, S2. Vascular:  Vessel Right Left  Radial Palpable Palpable  Ulnar Palpable Palpable  Brachial Palpable Palpable  Carotid Palpable, without bruit Palpable, without bruit  Aorta Not palpable N/A  Femoral Palpable Palpable  Popliteal Palpable Palpable  PT Palpable Palpable  DP Palpable Palpable   Gastrointestinal: soft, tender/non-distended. No guarding/reflex.  Musculoskeletal: M/S 5/5 throughout.  Extremities without ischemic changes.  No deformity or atrophy. No edema. Neurologic: Sensation grossly intact in extremities.  Symmetrical.  Speech is fluent. Motor exam as listed above. Psychiatric: Judgment intact, Mood & affect appropriate for pt's clinical situation. Dermatologic: No rashes or ulcers noted.  No cellulitis or open wounds. Lymph : No Cervical, Axillary, or Inguinal lymphadenopathy.  CBC Lab Results  Component Value Date   WBC 12.5 (H) 03/15/2021   HGB 7.9 (L) 03/15/2021   HCT 23.8 (L) 03/15/2021   MCV 95.6 03/15/2021   PLT 131 (L) 03/15/2021   BMET    Component Value Date/Time   NA 135 03/15/2021 0443   NA 141 02/10/2020 1005   NA 141  04/23/2012 2100   K 3.5 03/15/2021 0443   K 3.7 04/23/2012 2100   CL 101 03/15/2021 0443   CL 106 04/23/2012 2100   CO2 24 03/15/2021 0443   CO2 28 04/23/2012 2100   GLUCOSE 109 (H) 03/15/2021 0443   GLUCOSE 112 (H) 04/23/2012 2100   BUN 20 03/15/2021 0443   BUN 27 02/10/2020 1005   BUN 26 (H) 04/23/2012 2100   CREATININE 1.82 (H) 03/15/2021 0443   CREATININE 1.29  04/23/2012 2100   CALCIUM 9.8 03/15/2021 0443   CALCIUM 9.1 04/23/2012 2100   GFRNONAA 30 (L) 03/15/2021 0443   GFRNONAA 45 (L) 04/23/2012 2100   GFRAA 38 (L) 02/10/2020 1005   GFRAA 52 (L) 04/23/2012 2100   Estimated Creatinine Clearance: 33 mL/min (A) (by C-G formula based on SCr of 1.82 mg/dL (H)).  COAG Lab Results  Component Value Date   INR 2.8 (H) 03/15/2021   INR 1.8 (H) 03/14/2021   INR 1.4 (H) 03/13/2021   Radiology CT ABDOMEN PELVIS WO CONTRAST  Result Date: 03/14/2021 CLINICAL DATA:  Left upper quadrant abdominal pain. EXAM: CT ABDOMEN AND PELVIS WITHOUT CONTRAST TECHNIQUE: Multidetector CT imaging of the abdomen and pelvis was performed following the standard protocol without IV contrast. COMPARISON:  Ultrasound of same day. CT of March 11, 2021 and January 09, 2021. FINDINGS: Lower chest: Small left pleural effusion is noted with minimal adjacent subsegmental atelectasis. Hepatobiliary: No focal liver abnormality is seen. Status post cholecystectomy. No biliary dilatation. Pancreas: Unremarkable. No pancreatic ductal dilatation or surrounding inflammatory changes. Spleen: Stable size and appearance of oval-shaped low density involving inferior and anterior portion of the spleen most consistent with subcapsular hematoma. Infarction is felt to be less likely. Adrenals/Urinary Tract: 2 cm right adrenal nodule is noted which is unchanged compared to prior exams. Left adrenal gland is unremarkable. Bilateral renal cortical thinning and scarring is noted. No hydronephrosis or renal obstruction is noted. No renal or ureteral calculi are noted. Urinary bladder is unremarkable. Stomach/Bowel: The stomach and appendix appear normal. There is no evidence of bowel obstruction. No inflammation is noted. Large amount of stool is noted in the distal sigmoid colon and rectum concerning for impaction. Vascular/Lymphatic: Aortic atherosclerosis. No enlarged abdominal or pelvic lymph nodes.  Reproductive: Uterus and bilateral adnexa are unremarkable. Other: No abdominal wall hernia or abnormality. No abdominopelvic ascites. Musculoskeletal: No acute or significant osseous findings. IMPRESSION: Grossly stable size and appearance of oval-shaped low density involving inferior anterior portion of the spleen most consistent with subcapsular hematoma. Infarction is felt to be less likely. As recommended by ultrasound exam today, follow-up ultrasound in several weeks is recommended to ensure stability or resolution of this abnormality. These results were called by telephone at the time of interpretation on 03/14/2021 at 6:17 pm to provider [facility] , who verbally acknowledged these results. 2 cm right adrenal nodule is noted. Follow-up CT or MRI in 12 months is recommended to rule out neoplasm. Large amount of stool is noted in the distal sigmoid colon and rectum concerning for impaction. Aortic Atherosclerosis (ICD10-I70.0). Electronically Signed   By: Marijo Conception M.D.   On: 03/14/2021 18:17   DG Chest 2 View  Result Date: 03/11/2021 CLINICAL DATA:  Confusion EXAM: CHEST - 2 VIEW COMPARISON:  January 08, 2021 FINDINGS: Interstitium is mildly thickened. No edema or airspace opacity. Heart size and pulmonary vascularity are normal. No adenopathy. No bone lesions. Surgical clips noted in gallbladder fossa region. IMPRESSION: Interstitial thickening, likely indicative of  underlying chronic bronchitis. No edema or airspace opacity. Cardiac silhouette normal. Electronically Signed   By: Lowella Grip III M.D.   On: 03/11/2021 09:56   DG Ankle Complete Right  Result Date: 02/22/2021 CLINICAL DATA:  Right ankle pain, fell EXAM: RIGHT ANKLE - COMPLETE 3+ VIEW COMPARISON:  05/23/2020 FINDINGS: Frontal, oblique, lateral views of the right ankle are obtained. There is an oblique displaced lateral malleolar fracture, with lateral subluxation of the talus relative to the tibial plafond. A minimally  comminuted transverse medial malleolar fracture is also noted, with mild distraction. The minimally displaced fracture through the posterior malleolus is noted on lateral view. There is diffuse soft tissue swelling. Large inferior calcaneal spur. IMPRESSION: 1. Trimalleolar right ankle fracture, with lateral subluxation of the talus. 2. Diffuse soft tissue swelling. Electronically Signed   By: Randa Ngo M.D.   On: 02/22/2021 20:41   CT Head Wo Contrast  Result Date: 03/11/2021 CLINICAL DATA:  Altered mental status/confusion EXAM: CT HEAD WITHOUT CONTRAST TECHNIQUE: Contiguous axial images were obtained from the base of the skull through the vertex without intravenous contrast. COMPARISON:  February 22, 2021 FINDINGS: Brain: Moderate diffuse atrophy is stable. There is no intracranial mass, hemorrhage, extra-axial fluid collection, or midline shift. Decreased attenuation is noted with encephalomalacia in the medial right occipital lobe consistent with prior infarct. Prior infarct also noted in the posterosuperior right parietal lobe, stable. A prior infarct is noted in the posterior aspect of the mid left cerebellum, stable. There is mild decreased attenuation in portions of the centra semiovale bilaterally. No acute appearing infarct is evident. Vascular: There is no hyperdense vessel. There is calcification in each carotid siphon region. Skull: The bony calvarium appears intact. Sinuses/Orbits: There is slight mucosal thickening in several ethmoid air cells. Visualized orbits appear symmetric bilaterally. Other: Mastoid air cells are clear. IMPRESSION: Stable atrophy. Prior infarcts in the medial right occipital lobe, superior posterior right parietal lobe, and posterior mid left cerebellum. Decreased attenuation in the periventricular white matter is likely due to small vessel vascular disease, although a mild degree of interstitial edema secondary to the ventricular enlargement could present in this manner.  No acute infarct evident. No mass or hemorrhage. There are foci of arterial vascular calcification. There is slight mucosal thickening in several ethmoid air cells. Electronically Signed   By: Lowella Grip III M.D.   On: 03/11/2021 10:00   CT Head Wo Contrast  Result Date: 02/22/2021 CLINICAL DATA:  [place]. EXAM: CT HEAD WITHOUT CONTRAST CT CERVICAL SPINE WITHOUT CONTRAST TECHNIQUE: Multidetector CT imaging of the head and cervical spine was performed following the standard protocol without intravenous contrast. Multiplanar CT image reconstructions of the cervical spine were also generated. COMPARISON:  None. FINDINGS: CT HEAD FINDINGS Brain: Age advanced cerebral atrophy and associated ventriculomegaly. Evidence of multiple prior infarcts most notably in the right occipital lobe with associated encephalomalacia and ex vacuo dilatation of the occipital horn of the right lateral ventricle. I do not see any findings suggestive of an acute hemispheric infarction. No intracranial hemorrhage. No extra-axial fluid collections. Vascular: Vascular calcifications but no definite aneurysm or hyperdense vessels. Skull: No skull fracture or bone lesions. Sinuses/Orbits: The paranasal sinuses and mastoid air cells are clear. The globes are intact. Other: No scalp lesions or scalp hematoma. CT CERVICAL SPINE FINDINGS Alignment: Normal overall alignment of the cervical vertebral bodies. Skull base and vertebrae: No acute fracture. No primary bone lesion or focal pathologic process. Soft tissues and spinal canal: No prevertebral fluid  or swelling. No visible canal hematoma. Disc levels: Degenerative cervical spondylosis with disc disease and facet disease most notable at C4-5 and C5-6. No large disc protrusions or significant spinal stenosis. Uncinate spurring and facet disease but no significant foraminal stenosis. Upper chest: The visualized lung apices are grossly clear. Other: No neck mass, adenopathy or hematoma.  IMPRESSION: 1. Age advanced cerebral atrophy and associated ventriculomegaly. 2. Evidence of multiple prior infarcts. 3. No acute intracranial findings or skull fracture. 4. Degenerative cervical spondylosis with disc disease and facet disease but no acute cervical spine fracture. Electronically Signed   By: Marijo Sanes M.D.   On: 02/22/2021 21:52   CT Cervical Spine Wo Contrast  Result Date: 02/22/2021 CLINICAL DATA:  [place]. EXAM: CT HEAD WITHOUT CONTRAST CT CERVICAL SPINE WITHOUT CONTRAST TECHNIQUE: Multidetector CT imaging of the head and cervical spine was performed following the standard protocol without intravenous contrast. Multiplanar CT image reconstructions of the cervical spine were also generated. COMPARISON:  None. FINDINGS: CT HEAD FINDINGS Brain: Age advanced cerebral atrophy and associated ventriculomegaly. Evidence of multiple prior infarcts most notably in the right occipital lobe with associated encephalomalacia and ex vacuo dilatation of the occipital horn of the right lateral ventricle. I do not see any findings suggestive of an acute hemispheric infarction. No intracranial hemorrhage. No extra-axial fluid collections. Vascular: Vascular calcifications but no definite aneurysm or hyperdense vessels. Skull: No skull fracture or bone lesions. Sinuses/Orbits: The paranasal sinuses and mastoid air cells are clear. The globes are intact. Other: No scalp lesions or scalp hematoma. CT CERVICAL SPINE FINDINGS Alignment: Normal overall alignment of the cervical vertebral bodies. Skull base and vertebrae: No acute fracture. No primary bone lesion or focal pathologic process. Soft tissues and spinal canal: No prevertebral fluid or swelling. No visible canal hematoma. Disc levels: Degenerative cervical spondylosis with disc disease and facet disease most notable at C4-5 and C5-6. No large disc protrusions or significant spinal stenosis. Uncinate spurring and facet disease but no significant foraminal  stenosis. Upper chest: The visualized lung apices are grossly clear. Other: No neck mass, adenopathy or hematoma. IMPRESSION: 1. Age advanced cerebral atrophy and associated ventriculomegaly. 2. Evidence of multiple prior infarcts. 3. No acute intracranial findings or skull fracture. 4. Degenerative cervical spondylosis with disc disease and facet disease but no acute cervical spine fracture. Electronically Signed   By: Marijo Sanes M.D.   On: 02/22/2021 21:52   MR BRAIN WO CONTRAST  Result Date: 03/12/2021 CLINICAL DATA:  Confusion. Recent history of stroke. Hypercoagulable. EXAM: MRI HEAD WITHOUT CONTRAST TECHNIQUE: Multiplanar, multiecho pulse sequences of the brain and surrounding structures were obtained without intravenous contrast. COMPARISON:  12/26/2020 FINDINGS: Brain: Innumerable subcentimeter mainly cortically based acute infarcts along the bilateral cerebral convexity with predilection for the watershed regions. Similar size infarcts are seen in the bilateral cerebellum. Numerous pre-existing cerebellar and cortical infarcts with generalized brain atrophy. Large remote right occipital infarct with dense encephalomalacia. No acute hemorrhage, hydrocephalus, or mass. Vascular: Major flow voids are preserved Skull and upper cervical spine: Normal marrow signal Sinuses/Orbits: Negative IMPRESSION: 1. Shower of embolic pattern acute infarcts involving the infra and supratentorial brain. 2. Extensive chronic ischemic injury. Electronically Signed   By: Monte Fantasia M.D.   On: 03/12/2021 11:52   ECHOCARDIOGRAM COMPLETE  Result Date: 03/14/2021    ECHOCARDIOGRAM REPORT   Patient Name:   JEZEBEL POLLET Date of Exam: 03/14/2021 Medical Rec #:  [id number]          Height:  68.0 in Accession #:    [id number]         Weight:       184.0 lb Date of Birth:  [birth year]         BSA:          1.973 m Patient Age:    36 years           BP:           147/76 mmHg Patient Gender: F                  HR:            101 bpm. Exam Location:  [facility] Procedure: 2D Echo, Cardiac Doppler and Color Doppler Indications:     Stroke I63.9  History:         Patient has prior history of Echocardiogram examinations, most                  recent 12/27/2020. Stroke; Risk Factors:Hypertension.  Sonographer:     Sherrie Sport RDCS (AE) Referring Phys:  [id number] Barb Merino Diagnosing Phys: Isaias Cowman MD IMPRESSIONS  1. Left ventricular ejection fraction, by estimation, is 50 to 55%. The left ventricle has low normal function. The left ventricle has no regional wall motion abnormalities. Left ventricular diastolic parameters are consistent with Grade II diastolic dysfunction (pseudonormalization).  2. Right ventricular systolic function is normal. The right ventricular size is normal.  3. The mitral valve is normal in structure. Mild to moderate mitral valve regurgitation. No evidence of mitral stenosis.  4. The aortic valve is normal in structure. Aortic valve regurgitation is mild. No aortic stenosis is present.  5. The inferior vena cava is normal in size with greater than 50% respiratory variability, suggesting right atrial pressure of 3 mmHg. FINDINGS  Left Ventricle: Left ventricular ejection fraction, by estimation, is 50 to 55%. The left ventricle has low normal function. The left ventricle has no regional wall motion abnormalities. The left ventricular internal cavity size was normal in size. There is no left ventricular hypertrophy. Left ventricular diastolic parameters are consistent with Grade II diastolic dysfunction (pseudonormalization). Right Ventricle: The right ventricular size is normal. No increase in right ventricular wall thickness. Right ventricular systolic function is normal. Left Atrium: Left atrial size was normal in size. Right Atrium: Right atrial size was normal in size. Pericardium: There is no evidence of pericardial effusion. Mitral Valve: The mitral valve is normal in structure. Mild to moderate  mitral valve regurgitation. No evidence of mitral valve stenosis. Tricuspid Valve: The tricuspid valve is normal in structure. Tricuspid valve regurgitation is mild . No evidence of tricuspid stenosis. Aortic Valve: The aortic valve is normal in structure. Aortic valve regurgitation is mild. No aortic stenosis is present. Aortic valve mean gradient measures 6.0 mmHg. Aortic valve peak gradient measures 10.0 mmHg. Aortic valve area, by VTI measures 1.78  cm. Pulmonic Valve: The pulmonic valve was normal in structure. Pulmonic valve regurgitation is not visualized. No evidence of pulmonic stenosis. Aorta: The aortic root is normal in size and structure. Venous: The inferior vena cava is normal in size with greater than 50% respiratory variability, suggesting right atrial pressure of 3 mmHg. IAS/Shunts: No atrial level shunt detected by color flow Doppler.  LEFT VENTRICLE PLAX 2D LVIDd:         4.40 cm  Diastology LVIDs:         3.26 cm  LV e' medial:    6.64 cm/s  LV PW:         0.98 cm  LV E/e' medial:  10.3 LV IVS:        0.93 cm  LV e' lateral:   5.00 cm/s LVOT diam:     2.00 cm  LV E/e' lateral: 13.7 LV SV:         60 LV SV Index:   30 LVOT Area:     3.14 cm  RIGHT VENTRICLE RV Basal diam:  2.97 cm RV S prime:     9.57 cm/s TAPSE (M-mode): 3.1 cm LEFT ATRIUM             Index       RIGHT ATRIUM           Index LA diam:        4.00 cm 2.03 cm/m  RA Area:     13.90 cm LA Vol (A2C):   60.5 ml 30.67 ml/m RA Volume:   31.30 ml  15.87 ml/m LA Vol (A4C):   62.6 ml 31.73 ml/m LA Biplane Vol: 66.5 ml 33.71 ml/m  AORTIC VALVE                    PULMONIC VALVE AV Area (Vmax):    1.41 cm     PV Vmax:        0.75 m/s AV Area (Vmean):   1.29 cm     PV Peak grad:   2.2 mmHg AV Area (VTI):     1.78 cm     RVOT Peak grad: 2 mmHg AV Vmax:           158.33 cm/s AV Vmean:          116.000 cm/s AV VTI:            0.337 m AV Peak Grad:      10.0 mmHg AV Mean Grad:      6.0 mmHg LVOT Vmax:         71.10 cm/s LVOT Vmean:         47.600 cm/s LVOT VTI:          0.191 m LVOT/AV VTI ratio: 0.57  AORTA Ao Root diam: 2.83 cm MITRAL VALVE                TRICUSPID VALVE MV Area (PHT): 3.60 cm     TR Peak grad:   33.2 mmHg MV Decel Time: 211 msec     TR Vmax:        288.00 cm/s MV E velocity: 68.60 cm/s MV A velocity: 117.00 cm/s  SHUNTS MV E/A ratio:  0.59         Systemic VTI:  0.19 m                             Systemic Diam: 2.00 cm Isaias Cowman MD Electronically signed by Isaias Cowman MD Signature Date/Time: 03/14/2021/1:55:06 PM    Final    CT Renal Stone Study  Result Date: 03/11/2021 CLINICAL DATA:  70 year old with chronic kidney disease, presenting with a urinary tract infection. EXAM: CT ABDOMEN AND PELVIS WITHOUT CONTRAST TECHNIQUE: Multidetector CT imaging of the abdomen and pelvis was performed following the standard protocol without IV contrast. COMPARISON:  01/09/2021. FINDINGS: Lower chest: LEFT pleural effusion and associated mild passive atelectasis in th, broad-based central disc protrusion and multifactorial spinal stenosis at L4-5, and diffuse degenerative changes e LEFT LOWER LOBE. Visualized lung  bases otherwise clear. Heart moderately enlarged. Small pericardial effusion versus pericardial thickening. Hepatobiliary: Normal unenhanced appearance of the liver. Surgically absent gallbladder. No unexpected biliary ductal dilation. Pancreas: Normal unenhanced appearance. Spleen: Subtle low attenuation involving the lower pole of the spleen, not present on the prior CT, with associated edema in the perisplenic fat. Adrenals/Urinary Tract: Normal appearing adrenal glands. Scarring and mild cortical thinning involving both kidneys. Allowing for the unenhanced technique, no significant focal parenchymal abnormalities involving either kidney. No hydronephrosis. No urinary tract calculi. Normal appearing mildly distended urinary bladder. Stomach/Bowel: Stomach normal in appearance for the degree of distention.  Normal-appearing small bowel. Mobile cecum positioned in the RIGHT UPPER QUADRANT. Large rectal colonic stool burden and moderate colonic stool burden elsewhere. High attenuation ingested material within the colon and in the normal appearing appendix located in the RIGHT mid abdomen. No focal colonic abnormality. Vascular/Lymphatic: Mild atherosclerosis involving the abdominal aorta without evidence of aneurysm. No pathologic lymphadenopathy. Reproductive: Normal-appearing uterus and ovaries without evidence of adnexal mass. Other: Mild edema in the abdominal wall overlying both flanks, right greater than left. Musculoskeletal: Degenerative disc disease and spondylosis at L5-S1, broad-based central disc protrusion and multifactorial spinal stenosis at L4-5 and diffuse facet degenerative changes throughout the lumbar spine. No acute findings. IMPRESSION: 1. Subtle low attenuation involving the lower pole of the spleen, not present on the prior CT, with associated edema in the perisplenic fat, possibly indicating splenic infarct or splenic abscess. CT abdomen with contrast may be confirmatory. If the patient's renal function does not allow intravenous contrast administration, then ultrasound may be helpful in further evaluation. 2. No acute abnormalities otherwise involving the abdomen or pelvis. 3. LEFT pleural effusion and associated mild passive atelectasis in the LEFT LOWER LOBE. 4. Small pericardial effusion versus pericardial thickening. 5. Aortic Atherosclerosis (ICD10-I70.0). Electronically Signed   By: Evangeline Dakin M.D.   On: 03/11/2021 12:12   US ABDOMINAL PELVIC ART/VENT FLOW DOPPLER LIMITED  Result Date: 03/14/2021 CLINICAL DATA:  Evaluate splenic abnormality on recent CT. EXAM: ULTRASOUND ABDOMEN LIMITED WITH DOPPLER TECHNIQUE: Images were obtained at the area of concern at the spleen. COMPARISON:  CT of the abdomen and pelvis 03/11/2021 FINDINGS: Spleen measures 11.8 x 5.3 x 12.7 cm. Calculated  splenic volume is 397 mL. Superior and mid portions of the spleen have normal echotexture. Along the inferior aspect of the spleen there is fluid surrounding a solid structure which has similar echogenicity to the spleen. Suspect this is fluid collection around a small area of abnormal splenic tissue or fluid surrounding a splenic hematoma. These findings correspond with the recent CT findings. This area of concern with surrounding fluid measures 4.1 x 4.0 x 4.5 cm. Arterial and venous flow at the splenic hilum. No significant internal vascularity within the tissue surrounded by fluid. IMPRESSION: Complex fluid collection along the inferior aspect of spleen corresponding with the abnormality on the recent CT. The echogenic material surrounded by fluid could represent abnormal splenic tissue versus hematoma. The fluid collection and CT findings are more suggestive for a hematoma due to a splenic laceration rather than an infarct or abscess. Consider a follow-up ultrasound in 6-8 weeks to see if the fluid collection resolves. Electronically Signed   By: Markus Daft M.D.   On: 03/14/2021 13:23   US SPLEEN (ABDOMEN LIMITED)  Result Date: 03/14/2021 CLINICAL DATA:  Evaluate splenic abnormality on recent CT. EXAM: ULTRASOUND ABDOMEN LIMITED WITH DOPPLER TECHNIQUE: Images were obtained at the area of concern at the spleen. COMPARISON:  CT of the abdomen and pelvis 03/11/2021 FINDINGS: Spleen measures 11.8 x 5.3 x 12.7 cm. Calculated splenic volume is 397 mL. Superior and mid portions of the spleen have normal echotexture. Along the inferior aspect of the spleen there is fluid surrounding a solid structure which has similar echogenicity to the spleen. Suspect this is fluid collection around a small area of abnormal splenic tissue or fluid surrounding a splenic hematoma. These findings correspond with the recent CT findings. This area of concern with surrounding fluid measures 4.1 x 4.0 x 4.5 cm. Arterial and venous flow  at the splenic hilum. No significant internal vascularity within the tissue surrounded by fluid. IMPRESSION: Complex fluid collection along the inferior aspect of spleen corresponding with the abnormality on the recent CT. The echogenic material surrounded by fluid could represent abnormal splenic tissue versus hematoma. The fluid collection and CT findings are more suggestive for a hematoma due to a splenic laceration rather than an infarct or abscess. Consider a follow-up ultrasound in 6-8 weeks to see if the fluid collection resolves. Electronically Signed   By: Markus Daft M.D.   On: 03/14/2021 13:23   Assessment/Plan 70 y.o. female presented to [facility] ED initially on 04/09 for evaluation of AMS - incidental finding of subcapsular hematoma of the spleen   1.  Spleen hematoma: CT abdomen and pelvis without contrast yesterday was notable for a stable hematoma.  Patient's abdomen is benign on exam.  Hemoglobin is stable.  Vitals are relatively stable, patient is hypertensive.  Discussed the case in detail as well as reviewed images with Dr. Delana Meyer.  At this time, agree with general surgery there is no indication for vascular intervention.  Recommend supportive care.  We will continue to follow peripherally for now.  2.  Hyperlipidemia On statin for medical management Encouraged good control as its slows the progression of atherosclerotic disease.  3.  Chronic kidney disease Limit the use of nephrotoxic agents such as contrast Unsure if the patient is followed by her nephrologist  Discussed in detail with Dr. Francene Castle, PA-C  03/15/2021 2:18 PM  This note was created with Dragon medical transcription system.  Any error is purely unintentional

## 2021-03-15 NOTE — Progress Notes (Signed)
[facility] SURGICAL PROGRESS NOTE (cpt [id number])  Hospital Day(s): 3.   Interval History: Patient seen and examined, no acute events or new complaints overnight. Patient remains relatively unreliable historian. She does complain of some lower abdominal soreness but no LUQ pain. She does have a mild leukocytosis to 12.5K this morning. Hemoglobin is actually improved some this morning to 7.9 (from 7.3).  Renal function has remained stable, sCr - 1.82, UO - 300 + unmeasured. No electrolyte derangements. She did have repeat CT Abdomen/Pelvis yesterday afternoon which showed stable appearance to low density fluid around the spleen. She continues on heart healthy diet.   Review of Systems:  Limited secondary to mental acuity  Vital signs in last 24 hours: [min-max] current  Temp:  [97.6 F (36.4 C)-98.5 F (36.9 C)] 97.6 F (36.4 C) (04/13 0424) Pulse Rate:  [92-108] 92 (04/13 0424) Resp:  [16-20] 18 (04/13 0424) BP: (121-150)/(67-94) 121/67 (04/13 0424) SpO2:  [97 %-100 %] 97 % (04/13 0424)     Height: 5\' 8"  (172.7 cm) Weight: 83.5 kg BMI (Calculated): 27.98   Intake/Output last 2 shifts:  04/12 0701 - 04/13 0700 In: -  Out: 300 [Urine:300]   Physical Exam:  Constitutional: alert, cooperative and no distress  HENT: normocephalic without obvious abnormality  Eyes: PERRL, EOM's grossly intact and symmetric  Respiratory: breathing non-labored at rest  Cardiovascular: regular rate and sinus rhythm  Gastrointestinal: Soft, non-tender, non-distended, no rebound/guarding Musculoskeletal: no edema or wounds, motor and sensation grossly intact, NT    Labs:  CBC Latest Ref Rng & Units 03/15/2021 03/14/2021 03/13/2021  WBC 4.0 - 10.5 K/uL 12.5(H) 8.5 9.5  Hemoglobin 12.0 - 15.0 g/dL 7.9(L) 7.3(L) 7.6(L)  Hematocrit 36.0 - 46.0 % 23.8(L) 22.0(L) 23.4(L)  Platelets 150 - 400 K/uL 131(L) 94(L) 78(L)   CMP Latest Ref Rng & Units 03/15/2021 03/14/2021 03/13/2021  Glucose 70 - 99 mg/dL  109(H) 98 109(H)  BUN 8 - 23 mg/dL 20 21 26(H)  Creatinine 0.44 - 1.00 mg/dL 1.82(H) 1.84(H) 1.77(H)  Sodium 135 - 145 mmol/L 135 136 136  Potassium 3.5 - 5.1 mmol/L 3.5 3.7 4.0  Chloride 98 - 111 mmol/L 101 104 104  CO2 22 - 32 mmol/L 24 25 24   Calcium 8.9 - 10.3 mg/dL 9.8 9.4 9.7  Total Protein 6.5 - 8.1 g/dL - - -  Total Bilirubin 0.3 - 1.2 mg/dL - - -  Alkaline Phos 38 - 126 U/L - - -  AST 15 - 41 U/L - - -  ALT 0 - 44 U/L - - -    Imaging studies: No new pertinent imaging studies   Assessment/Plan: (ICD-10's: S22.029A) 70 y.o. female with stable/improved Hgb found to have possible splenic hematoma with unknown chronicity, abscess is also possible, complicated by pertinent comorbidities including CVA, DVT, antiphospholipid antibody syndrome in need for chronic anticoagulation on Coumadin, as well as CKD, HLD, and debilitation/cognitive impairment   - Appreciate vascular surgery assistance  - No surgical interventions; will follow closely  - Monitor leukocytosis; trend  - Monitor H&H; transfuse as needed             - Monitor abdominal examination              - Pain control prn; antiemetics prn              - Further management per primary service; we will follow   All of the above findings and recommendations were discussed with the patient, and the  medical team.  -- Edison Simon, PA-C [facility] 03/15/2021, 7:19 AM [phone] M-F: 7am - 4pm

## 2021-03-16 ENCOUNTER — Other Ambulatory Visit: Payer: Self-pay

## 2021-03-16 ENCOUNTER — Inpatient Hospital Stay: Payer: Medicare Other

## 2021-03-16 DIAGNOSIS — D7389 Other diseases of spleen: Secondary | ICD-10-CM

## 2021-03-16 LAB — CBC WITH DIFFERENTIAL/PLATELET
Abs Immature Granulocytes: 0.13 10*3/uL — ABNORMAL HIGH (ref 0.00–0.07)
Basophils Absolute: 0.1 10*3/uL (ref 0.0–0.1)
Basophils Relative: 1 %
Eosinophils Absolute: 0.2 10*3/uL (ref 0.0–0.5)
Eosinophils Relative: 2 %
HCT: 24.1 % — ABNORMAL LOW (ref 36.0–46.0)
Hemoglobin: 7.7 g/dL — ABNORMAL LOW (ref 12.0–15.0)
Immature Granulocytes: 2 %
Lymphocytes Relative: 16 %
Lymphs Abs: 1.3 10*3/uL (ref 0.7–4.0)
MCH: 31.3 pg (ref 26.0–34.0)
MCHC: 32 g/dL (ref 30.0–36.0)
MCV: 98 fL (ref 80.0–100.0)
Monocytes Absolute: 0.7 10*3/uL (ref 0.1–1.0)
Monocytes Relative: 9 %
Neutro Abs: 5.7 10*3/uL (ref 1.7–7.7)
Neutrophils Relative %: 70 %
Platelets: 143 10*3/uL — ABNORMAL LOW (ref 150–400)
RBC: 2.46 MIL/uL — ABNORMAL LOW (ref 3.87–5.11)
RDW: 14.5 % (ref 11.5–15.5)
WBC: 8 10*3/uL (ref 4.0–10.5)
nRBC: 0 % (ref 0.0–0.2)

## 2021-03-16 LAB — ANA COMPREHENSIVE PANEL
Anti JO-1: <0.2 AI (ref 0.0–0.9)
Centromere Ab Screen: 2.9 AI — ABNORMAL HIGH (ref 0.0–0.9)
Chromatin Ab SerPl-aCnc: 0.2 AI (ref 0.0–0.9)
ENA SM Ab Ser-aCnc: <0.2 AI (ref 0.0–0.9)
Ribonucleic Protein: <0.2 AI (ref 0.0–0.9)
SSA (Ro) (ENA) Antibody, IgG: <0.2 AI (ref 0.0–0.9)
SSB (La) (ENA) Antibody, IgG: <0.2 AI (ref 0.0–0.9)
Scleroderma (Scl-70) (ENA) Antibody, IgG: <0.2 AI (ref 0.0–0.9)
ds DNA Ab: 12 IU/mL — ABNORMAL HIGH (ref 0–9)

## 2021-03-16 LAB — PROTIME-INR
INR: 2.8 — ABNORMAL HIGH (ref 0.8–1.2)
Prothrombin Time: 29.1 seconds — ABNORMAL HIGH (ref 11.4–15.2)

## 2021-03-16 LAB — PROCALCITONIN: Procalcitonin: 4 ng/mL

## 2021-03-16 LAB — KAPPA/LAMBDA LIGHT CHAINS
Kappa free light chain: 123.6 mg/L — ABNORMAL HIGH (ref 3.3–19.4)
Kappa, lambda light chain ratio: 1.73 — ABNORMAL HIGH (ref 0.26–1.65)
Lambda free light chains: 71.3 mg/L — ABNORMAL HIGH (ref 5.7–26.3)

## 2021-03-16 LAB — HAPTOGLOBIN: Haptoglobin: 204 mg/dL (ref 37–355)

## 2021-03-16 IMAGING — CT CT BIOPSY AND ASPIRATION BONE MARROW
2 of 3 series · 7 of 14 positions shown, 9 images · non-contrast
Comparison: none

INDICATION: Anemia

[Series 2: i-spiral 5.0 b30f · axial · 0.63mm/px · z∈[+799,+855]mm · 5 of 25 slices shown, 7 images (1 of 2)]
[im 5/25  soft-tissue]
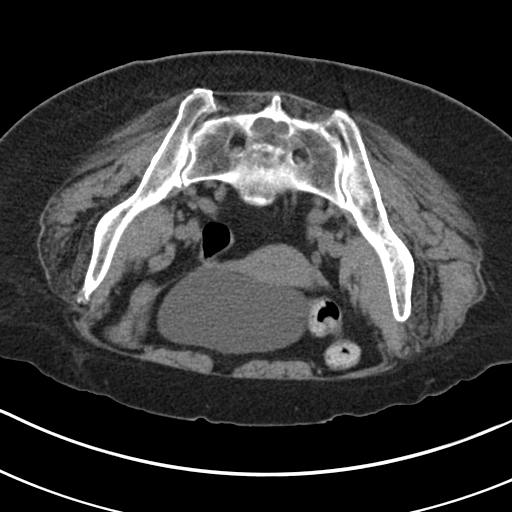
[im 5/25  bone]
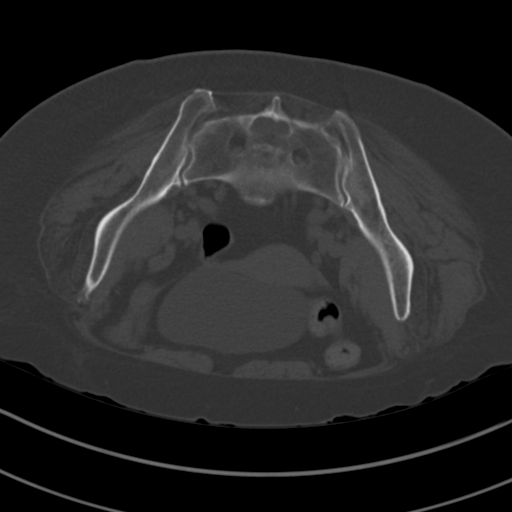
[im 9/25  bone]
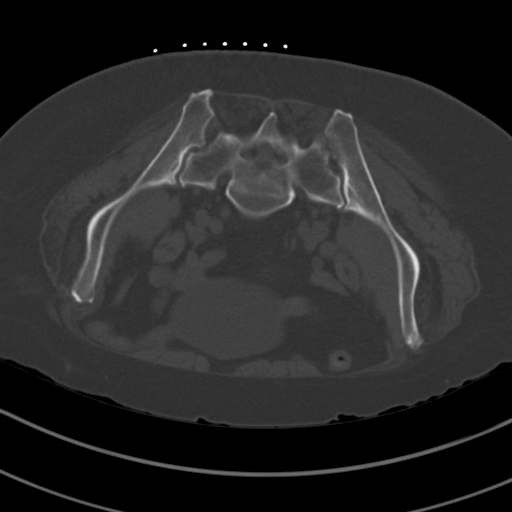
[im 13/25  bone]
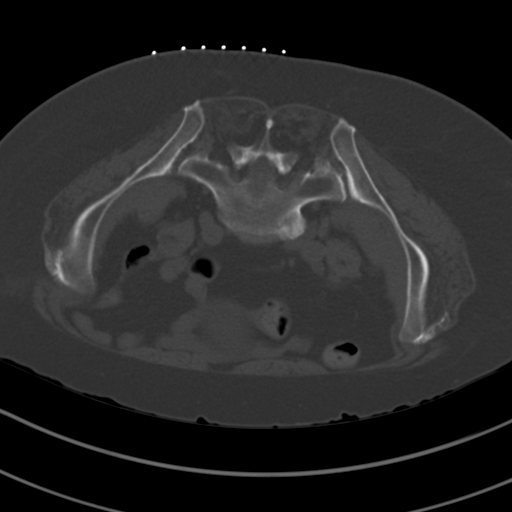
[im 17/25  bone]
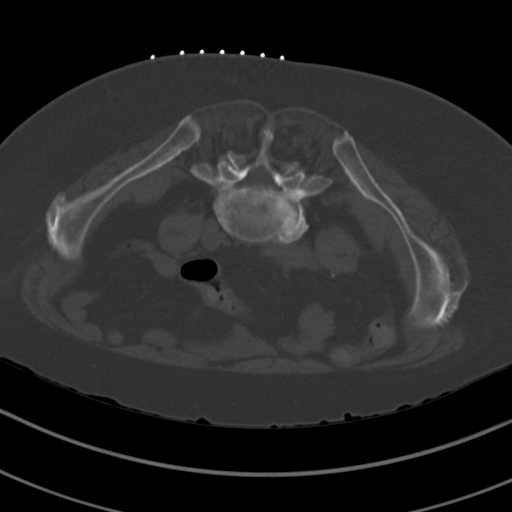
[im 21/25  soft-tissue]
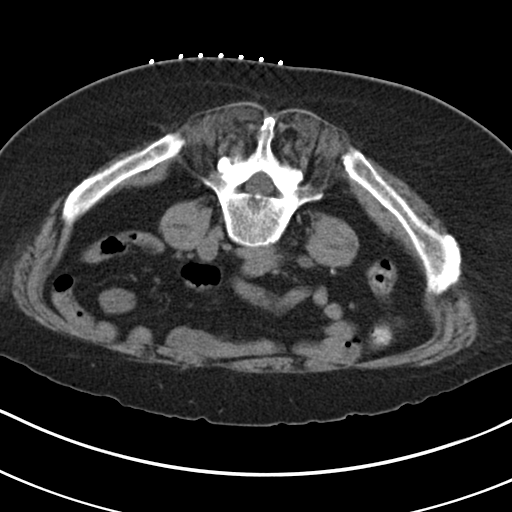
[im 21/25  bone]
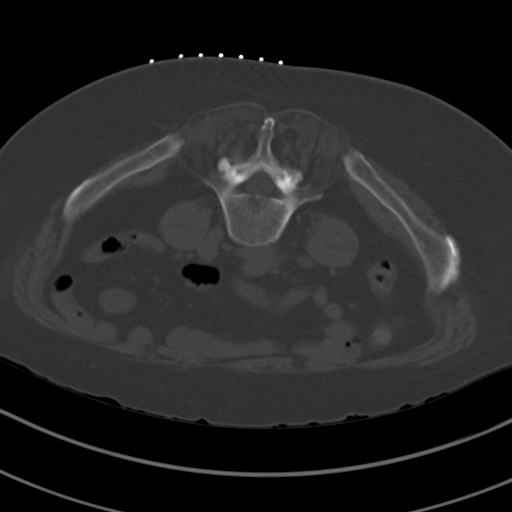

[Series 3: i-spiral 5.0 b30f · axial · 0.63mm/px · z∈[+802,+820]mm · 2 of 16 slices shown (2 of 2)]
[im 6/16  bone]
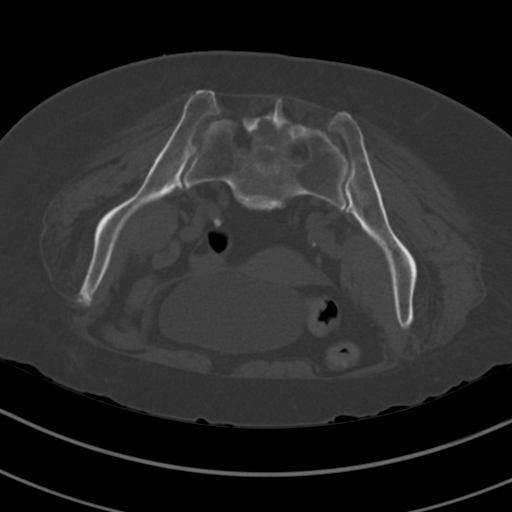
[im 11/16  bone]
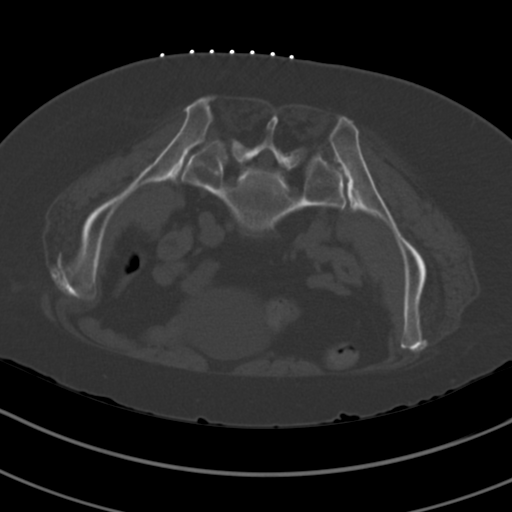

[7 of 14 positions shown; findings below may reference images not displayed]

Thrombocytopenia

EXAM:
CT GUIDED BONE MARROW ASPIRATES AND BIOPSY

MEDICATIONS:
None.

ANESTHESIA/SEDATION:
Fentanyl 100 mcg IV; Versed 2 mg IV

Moderate Sedation Time:  12 minutes

The patient was continuously monitored during the procedure by the
interventional radiology nurse under my direct supervision.

COMPLICATIONS:
None immediate.

PROCEDURE:
The procedure was explained to the patient. The risks and benefits
of the procedure were discussed and the patient's questions were
addressed. Informed consent was obtained from the patient. The
patient was placed prone on CT table. Images of the pelvis were
obtained. The right side of back was prepped and draped in sterile
fashion. The skin and right posterior ilium were anesthetized with
1% lidocaine. 11 gauge bone needle was directed into the right ilium
with CT guidance. Two aspirates and 1 core biopsy were obtained.
Bandage placed over the puncture site.
IMPRESSION: CT guided bone marrow aspiration and core biopsy.

## 2021-03-16 MED ORDER — MIDAZOLAM HCL 2 MG/2ML IJ SOLN
INTRAMUSCULAR | Status: DC | PRN
  Administered 2021-03-16 (×2): 1 mg via INTRAVENOUS

## 2021-03-16 MED ORDER — HEPARIN SOD (PORK) LOCK FLUSH 100 UNIT/ML IV SOLN
INTRAVENOUS | Status: AC
  Filled 2021-03-16: qty 5

## 2021-03-16 MED ORDER — FENTANYL CITRATE (PF) 100 MCG/2ML IJ SOLN
INTRAMUSCULAR | Status: AC
  Filled 2021-03-16: qty 2

## 2021-03-16 MED ORDER — MORPHINE SULFATE (PF) 2 MG/ML IV SOLN
1.0000 mg | Freq: Once | INTRAVENOUS | Status: AC
Start: 2021-03-16 — End: 2021-03-16
  Administered 2021-03-16: 20:00:00 1 mg via INTRAVENOUS
  Filled 2021-03-16: qty 1

## 2021-03-16 MED ORDER — MIDAZOLAM HCL 2 MG/2ML IJ SOLN
INTRAMUSCULAR | Status: AC
  Filled 2021-03-16: qty 2

## 2021-03-16 MED ORDER — FENTANYL CITRATE (PF) 100 MCG/2ML IJ SOLN
INTRAMUSCULAR | Status: DC | PRN
  Administered 2021-03-16 (×2): 50 ug via INTRAVENOUS

## 2021-03-16 NOTE — Care Management Important Message (Signed)
Important Message  Patient Details  Name: [name] MRN: [id number] Date of Birth: 1951/03/07   Medicare Important Message Given:  Yes     [name] 03/16/2021, 10:12 AM

## 2021-03-16 NOTE — Progress Notes (Incomplete)
Patient Name: [name] [facility] Date of Encounter: 03/16/2021  Hospital Problem List     Active Problems:   Adult hypothyroidism   Essential hypertension   Anticoagulant long-term use   Antiphospholipid antibody syndrome (HCC)   Stage 3 chronic kidney disease (HCC)   Thrombocytopenia (HCC)   CVA (cerebral vascular accident) ([place])   Ischemic cerebrovascular accident (CVA) of frontal lobe (HCC)   Embolism of left anterior cerebral artery   AKI (acute kidney injury) ([place])   Pressure injury of skin   Encephalopathy   Normocytic anemia    Patient Profile         70 y.o.femalewith history ofhypertension,history of right trimalleoli are fracture,hypothyroid, history of DVT and antiphospholipid antibody syndrome previously on Coumadin, CKD 3B, hyperlipidemia, depression, mild cognitive decline, history of CVA, right PCA infarct, presents emergency department for chief concerns of confusion. This has apparently been there for several weeks.Brain MRI done today revealed evidence of shower of embolic pattern acute infarcts invovling the infr and suprtentorial brain.Patient had been on warfarin for quite a few years and then had difficulty managing the levels and had a CVA felt to be secondary to an adequate INR and was converted to Eliquis. As mentioned above over the past several weeks there has been altered mental status and confusion. She was brought to the emergency room where laboratories revealed acute on chronic renal insufficiency with a creatinine up to 2.03 sodium 132, hemoglobin of 7.6 which has been gradually declining from a value of 10.32 months ago. Protocol serum troponins were drawn showed High-sensitivity troponins of 543 and 564. EKG showed sinus rhythm with no ischemia. No arrhythmia. Patient denied chest pain. Patient was placed on heparin Eliquis stopped due to the elevated troponins. She was placed on a statin. Brain CT done yesterday in emergency room  showed prior infarct of the right occipital lobe superior posterior right parietal lobe and posterior mid left cerebellum. No acute infarct or bleeding was noted. Brain MRI done today showed shower of embolic pattern acute infarcts involving the infra and supratentorial brain. Extensive chronic ischemic injury. These were bilateral. Patient had an echo in January showing normal EF. No shunt by color-flow Doppler.   Subjective   ***  Inpatient Medications    . atorvastatin  20 mg Oral QHS  . buPROPion  150 mg Oral Daily  . escitalopram  20 mg Oral Daily  . famotidine  20 mg Oral Daily  . feeding supplement  237 mL Oral TID BM  . ferrous sulfate  325 mg Oral QHS  . levothyroxine  100 mcg Oral Q0600  . lidocaine  1 patch Transdermal Q24H  . mouth rinse  15 mL Mouth Rinse BID  . melatonin  5 mg Oral QHS  . montelukast  10 mg Oral QHS  . multivitamin with minerals   Oral Daily  . oxybutynin  15 mg Oral QHS  . polyethylene glycol  17 g Oral BID  . senna-docusate  2 tablet Oral BID  . vitamin B-12  1,000 mcg Oral Daily  . Warfarin - Pharmacist Dosing Inpatient   Does not apply q1600    Vital Signs    Vitals:   03/15/21 2110 03/15/21 2113 03/15/21 2339 03/16/21 0426  BP:   134/79 137/78  Pulse: (!) 112 (!) 109 79 96  Resp:    16  Temp:   98.7 F (37.1 C) 98.6 F (37 C)  TempSrc:   Oral   SpO2:   99% 95%  Weight:      Height:        Intake/Output Summary (Last 24 hours) at 03/16/2021 0735 Last data filed at 03/16/2021 0400 Gross per 24 hour  Intake 794.54 ml  Output -  Net 794.54 ml   Filed Weights   03/11/21 0921  Weight: 83.5 kg    Physical Exam    GEN: Well nourished, well developed, in no acute distress.  HEENT: normal.  Neck: Supple, no JVD, carotid bruits, or masses. Cardiac: RRR, no murmurs, rubs, or gallops. No clubbing, cyanosis, edema.  Radials/DP/PT 2+ and equal bilaterally.  Respiratory:  Respirations regular and unlabored, clear to auscultation  bilaterally. GI: Soft, nontender, nondistended, BS + x 4. MS: no deformity or atrophy. Skin: warm and dry, no rash. Neuro:  Strength and sensation are intact. Psych: Normal affect.  Labs    CBC Recent Labs    03/15/21 0443 03/16/21 0547  WBC 12.5* 8.0  NEUTROABS 9.7* 5.7  HGB 7.9* 7.7*  HCT 23.8* 24.1*  MCV 95.6 98.0  PLT 131* 176*   Basic Metabolic Panel Recent Labs    03/14/21 0457 03/15/21 0443  NA 136 135  K 3.7 3.5  CL 104 101  CO2 25 24  GLUCOSE 98 109*  BUN 21 20  CREATININE 1.84* 1.82*  CALCIUM 9.4 9.8   Liver Function Tests No results for input(s): AST, ALT, ALKPHOS, BILITOT, PROT, ALBUMIN in the last 72 hours. No results for input(s): LIPASE, AMYLASE in the last 72 hours. Cardiac Enzymes No results for input(s): CKTOTAL, CKMB, CKMBINDEX, TROPONINI in the last 72 hours. BNP No results for input(s): BNP in the last 72 hours. D-Dimer No results for input(s): DDIMER in the last 72 hours. Hemoglobin A1C Recent Labs    03/14/21 0457  HGBA1C 5.1   Fasting Lipid Panel Recent Labs    03/14/21 0457  CHOL 111  HDL 38*  LDLCALC 53  TRIG 99  CHOLHDL 2.9   Thyroid Function Tests No results for input(s): TSH, T4TOTAL, T3FREE, THYROIDAB in the last 72 hours.  Invalid input(s): FREET3  Telemetry    ***  ECG    ***  Radiology    CT ABDOMEN PELVIS WO CONTRAST  Result Date: 03/14/2021 CLINICAL DATA:  Left upper quadrant abdominal pain. EXAM: CT ABDOMEN AND PELVIS WITHOUT CONTRAST TECHNIQUE: Multidetector CT imaging of the abdomen and pelvis was performed following the standard protocol without IV contrast. COMPARISON:  Ultrasound of same day. CT of March 11, 2021 and January 09, 2021. FINDINGS: Lower chest: Small left pleural effusion is noted with minimal adjacent subsegmental atelectasis. Hepatobiliary: No focal liver abnormality is seen. Status post cholecystectomy. No biliary dilatation. Pancreas: Unremarkable. No pancreatic ductal dilatation or  surrounding inflammatory changes. Spleen: Stable size and appearance of oval-shaped low density involving inferior and anterior portion of the spleen most consistent with subcapsular hematoma. Infarction is felt to be less likely. Adrenals/Urinary Tract: 2 cm right adrenal nodule is noted which is unchanged compared to prior exams. Left adrenal gland is unremarkable. Bilateral renal cortical thinning and scarring is noted. No hydronephrosis or renal obstruction is noted. No renal or ureteral calculi are noted. Urinary bladder is unremarkable. Stomach/Bowel: The stomach and appendix appear normal. There is no evidence of bowel obstruction. No inflammation is noted. Large amount of stool is noted in the distal sigmoid colon and rectum concerning for impaction. Vascular/Lymphatic: Aortic atherosclerosis. No enlarged abdominal or pelvic lymph nodes. Reproductive: Uterus and bilateral adnexa are unremarkable. Other: No abdominal wall hernia  or abnormality. No abdominopelvic ascites. Musculoskeletal: No acute or significant osseous findings. IMPRESSION: Grossly stable size and appearance of oval-shaped low density involving inferior anterior portion of the spleen most consistent with subcapsular hematoma. Infarction is felt to be less likely. As recommended by ultrasound exam today, follow-up ultrasound in several weeks is recommended to ensure stability or resolution of this abnormality. These results were called by telephone at the time of interpretation on 03/14/2021 at 6:17 pm to provider [facility] , who verbally acknowledged these results. 2 cm right adrenal nodule is noted. Follow-up CT or MRI in 12 months is recommended to rule out neoplasm. Large amount of stool is noted in the distal sigmoid colon and rectum concerning for impaction. Aortic Atherosclerosis (ICD10-I70.0). Electronically Signed   By: Marijo Conception M.D.   On: 03/14/2021 18:17   DG Chest 2 View  Result Date: 03/11/2021 CLINICAL DATA:   Confusion EXAM: CHEST - 2 VIEW COMPARISON:  January 08, 2021 FINDINGS: Interstitium is mildly thickened. No edema or airspace opacity. Heart size and pulmonary vascularity are normal. No adenopathy. No bone lesions. Surgical clips noted in gallbladder fossa region. IMPRESSION: Interstitial thickening, likely indicative of underlying chronic bronchitis. No edema or airspace opacity. Cardiac silhouette normal. Electronically Signed   By: Lowella Grip III M.D.   On: 03/11/2021 09:56   DG Ankle Complete Right  Result Date: 02/22/2021 CLINICAL DATA:  Right ankle pain, fell EXAM: RIGHT ANKLE - COMPLETE 3+ VIEW COMPARISON:  05/23/2020 FINDINGS: Frontal, oblique, lateral views of the right ankle are obtained. There is an oblique displaced lateral malleolar fracture, with lateral subluxation of the talus relative to the tibial plafond. A minimally comminuted transverse medial malleolar fracture is also noted, with mild distraction. The minimally displaced fracture through the posterior malleolus is noted on lateral view. There is diffuse soft tissue swelling. Large inferior calcaneal spur. IMPRESSION: 1. Trimalleolar right ankle fracture, with lateral subluxation of the talus. 2. Diffuse soft tissue swelling. Electronically Signed   By: Randa Ngo M.D.   On: 02/22/2021 20:41   CT Head Wo Contrast  Result Date: 03/11/2021 CLINICAL DATA:  Altered mental status/confusion EXAM: CT HEAD WITHOUT CONTRAST TECHNIQUE: Contiguous axial images were obtained from the base of the skull through the vertex without intravenous contrast. COMPARISON:  February 22, 2021 FINDINGS: Brain: Moderate diffuse atrophy is stable. There is no intracranial mass, hemorrhage, extra-axial fluid collection, or midline shift. Decreased attenuation is noted with encephalomalacia in the medial right occipital lobe consistent with prior infarct. Prior infarct also noted in the posterosuperior right parietal lobe, stable. A prior infarct is noted in  the posterior aspect of the mid left cerebellum, stable. There is mild decreased attenuation in portions of the centra semiovale bilaterally. No acute appearing infarct is evident. Vascular: There is no hyperdense vessel. There is calcification in each carotid siphon region. Skull: The bony calvarium appears intact. Sinuses/Orbits: There is slight mucosal thickening in several ethmoid air cells. Visualized orbits appear symmetric bilaterally. Other: Mastoid air cells are clear. IMPRESSION: Stable atrophy. Prior infarcts in the medial right occipital lobe, superior posterior right parietal lobe, and posterior mid left cerebellum. Decreased attenuation in the periventricular white matter is likely due to small vessel vascular disease, although a mild degree of interstitial edema secondary to the ventricular enlargement could present in this manner. No acute infarct evident. No mass or hemorrhage. There are foci of arterial vascular calcification. There is slight mucosal thickening in several ethmoid air cells. Electronically Signed  By: Lowella Grip III M.D.   On: 03/11/2021 10:00   CT Head Wo Contrast  Result Date: 02/22/2021 CLINICAL DATA:  [place]. EXAM: CT HEAD WITHOUT CONTRAST CT CERVICAL SPINE WITHOUT CONTRAST TECHNIQUE: Multidetector CT imaging of the head and cervical spine was performed following the standard protocol without intravenous contrast. Multiplanar CT image reconstructions of the cervical spine were also generated. COMPARISON:  None. FINDINGS: CT HEAD FINDINGS Brain: Age advanced cerebral atrophy and associated ventriculomegaly. Evidence of multiple prior infarcts most notably in the right occipital lobe with associated encephalomalacia and ex vacuo dilatation of the occipital horn of the right lateral ventricle. I do not see any findings suggestive of an acute hemispheric infarction. No intracranial hemorrhage. No extra-axial fluid collections. Vascular: Vascular calcifications but no  definite aneurysm or hyperdense vessels. Skull: No skull fracture or bone lesions. Sinuses/Orbits: The paranasal sinuses and mastoid air cells are clear. The globes are intact. Other: No scalp lesions or scalp hematoma. CT CERVICAL SPINE FINDINGS Alignment: Normal overall alignment of the cervical vertebral bodies. Skull base and vertebrae: No acute fracture. No primary bone lesion or focal pathologic process. Soft tissues and spinal canal: No prevertebral fluid or swelling. No visible canal hematoma. Disc levels: Degenerative cervical spondylosis with disc disease and facet disease most notable at C4-5 and C5-6. No large disc protrusions or significant spinal stenosis. Uncinate spurring and facet disease but no significant foraminal stenosis. Upper chest: The visualized lung apices are grossly clear. Other: No neck mass, adenopathy or hematoma. IMPRESSION: 1. Age advanced cerebral atrophy and associated ventriculomegaly. 2. Evidence of multiple prior infarcts. 3. No acute intracranial findings or skull fracture. 4. Degenerative cervical spondylosis with disc disease and facet disease but no acute cervical spine fracture. Electronically Signed   By: Marijo Sanes M.D.   On: 02/22/2021 21:52   CT Cervical Spine Wo Contrast  Result Date: 02/22/2021 CLINICAL DATA:  [place]. EXAM: CT HEAD WITHOUT CONTRAST CT CERVICAL SPINE WITHOUT CONTRAST TECHNIQUE: Multidetector CT imaging of the head and cervical spine was performed following the standard protocol without intravenous contrast. Multiplanar CT image reconstructions of the cervical spine were also generated. COMPARISON:  None. FINDINGS: CT HEAD FINDINGS Brain: Age advanced cerebral atrophy and associated ventriculomegaly. Evidence of multiple prior infarcts most notably in the right occipital lobe with associated encephalomalacia and ex vacuo dilatation of the occipital horn of the right lateral ventricle. I do not see any findings suggestive of an acute hemispheric  infarction. No intracranial hemorrhage. No extra-axial fluid collections. Vascular: Vascular calcifications but no definite aneurysm or hyperdense vessels. Skull: No skull fracture or bone lesions. Sinuses/Orbits: The paranasal sinuses and mastoid air cells are clear. The globes are intact. Other: No scalp lesions or scalp hematoma. CT CERVICAL SPINE FINDINGS Alignment: Normal overall alignment of the cervical vertebral bodies. Skull base and vertebrae: No acute fracture. No primary bone lesion or focal pathologic process. Soft tissues and spinal canal: No prevertebral fluid or swelling. No visible canal hematoma. Disc levels: Degenerative cervical spondylosis with disc disease and facet disease most notable at C4-5 and C5-6. No large disc protrusions or significant spinal stenosis. Uncinate spurring and facet disease but no significant foraminal stenosis. Upper chest: The visualized lung apices are grossly clear. Other: No neck mass, adenopathy or hematoma. IMPRESSION: 1. Age advanced cerebral atrophy and associated ventriculomegaly. 2. Evidence of multiple prior infarcts. 3. No acute intracranial findings or skull fracture. 4. Degenerative cervical spondylosis with disc disease and facet disease but no acute cervical spine fracture.  Electronically Signed   By: Marijo Sanes M.D.   On: 02/22/2021 21:52   MR BRAIN WO CONTRAST  Result Date: 03/12/2021 CLINICAL DATA:  Confusion. Recent history of stroke. Hypercoagulable. EXAM: MRI HEAD WITHOUT CONTRAST TECHNIQUE: Multiplanar, multiecho pulse sequences of the brain and surrounding structures were obtained without intravenous contrast. COMPARISON:  12/26/2020 FINDINGS: Brain: Innumerable subcentimeter mainly cortically based acute infarcts along the bilateral cerebral convexity with predilection for the watershed regions. Similar size infarcts are seen in the bilateral cerebellum. Numerous pre-existing cerebellar and cortical infarcts with generalized brain atrophy.  Large remote right occipital infarct with dense encephalomalacia. No acute hemorrhage, hydrocephalus, or mass. Vascular: Major flow voids are preserved Skull and upper cervical spine: Normal marrow signal Sinuses/Orbits: Negative IMPRESSION: 1. Shower of embolic pattern acute infarcts involving the infra and supratentorial brain. 2. Extensive chronic ischemic injury. Electronically Signed   By: Monte Fantasia M.D.   On: 03/12/2021 11:52   ECHOCARDIOGRAM COMPLETE  Result Date: 03/14/2021    ECHOCARDIOGRAM REPORT   Patient Name:   TRENIYAH LYNN Date of Exam: 03/14/2021 Medical Rec #:  [id number]          Height:       68.0 in Accession #:    [id number]         Weight:       184.0 lb Date of Birth:  1951/11/20         BSA:          1.973 m Patient Age:    58 years           BP:           147/76 mmHg Patient Gender: F                  HR:           101 bpm. Exam Location:  [facility] Procedure: 2D Echo, Cardiac Doppler and Color Doppler Indications:     Stroke I63.9  History:         Patient has prior history of Echocardiogram examinations, most                  recent 12/27/2020. Stroke; Risk Factors:Hypertension.  Sonographer:     Sherrie Sport RDCS (AE) Referring Phys:  [id number] Barb Merino Diagnosing Phys: Isaias Cowman MD IMPRESSIONS  1. Left ventricular ejection fraction, by estimation, is 50 to 55%. The left ventricle has low normal function. The left ventricle has no regional wall motion abnormalities. Left ventricular diastolic parameters are consistent with Grade II diastolic dysfunction (pseudonormalization).  2. Right ventricular systolic function is normal. The right ventricular size is normal.  3. The mitral valve is normal in structure. Mild to moderate mitral valve regurgitation. No evidence of mitral stenosis.  4. The aortic valve is normal in structure. Aortic valve regurgitation is mild. No aortic stenosis is present.  5. The inferior vena cava is normal in size with greater than 50% respiratory  variability, suggesting right atrial pressure of 3 mmHg. FINDINGS  Left Ventricle: Left ventricular ejection fraction, by estimation, is 50 to 55%. The left ventricle has low normal function. The left ventricle has no regional wall motion abnormalities. The left ventricular internal cavity size was normal in size. There is no left ventricular hypertrophy. Left ventricular diastolic parameters are consistent with Grade II diastolic dysfunction (pseudonormalization). Right Ventricle: The right ventricular size is normal. No increase in right ventricular wall thickness. Right ventricular systolic function is normal. Left Atrium: Left  atrial size was normal in size. Right Atrium: Right atrial size was normal in size. Pericardium: There is no evidence of pericardial effusion. Mitral Valve: The mitral valve is normal in structure. Mild to moderate mitral valve regurgitation. No evidence of mitral valve stenosis. Tricuspid Valve: The tricuspid valve is normal in structure. Tricuspid valve regurgitation is mild . No evidence of tricuspid stenosis. Aortic Valve: The aortic valve is normal in structure. Aortic valve regurgitation is mild. No aortic stenosis is present. Aortic valve mean gradient measures 6.0 mmHg. Aortic valve peak gradient measures 10.0 mmHg. Aortic valve area, by VTI measures 1.78  cm. Pulmonic Valve: The pulmonic valve was normal in structure. Pulmonic valve regurgitation is not visualized. No evidence of pulmonic stenosis. Aorta: The aortic root is normal in size and structure. Venous: The inferior vena cava is normal in size with greater than 50% respiratory variability, suggesting right atrial pressure of 3 mmHg. IAS/Shunts: No atrial level shunt detected by color flow Doppler.  LEFT VENTRICLE PLAX 2D LVIDd:         4.40 cm  Diastology LVIDs:         3.26 cm  LV e' medial:    6.64 cm/s LV PW:         0.98 cm  LV E/e' medial:  10.3 LV IVS:        0.93 cm  LV e' lateral:   5.00 cm/s LVOT diam:     2.00  cm  LV E/e' lateral: 13.7 LV SV:         60 LV SV Index:   30 LVOT Area:     3.14 cm  RIGHT VENTRICLE RV Basal diam:  2.97 cm RV S prime:     9.57 cm/s TAPSE (M-mode): 3.1 cm LEFT ATRIUM             Index       RIGHT ATRIUM           Index LA diam:        4.00 cm 2.03 cm/m  RA Area:     13.90 cm LA Vol (A2C):   60.5 ml 30.67 ml/m RA Volume:   31.30 ml  15.87 ml/m LA Vol (A4C):   62.6 ml 31.73 ml/m LA Biplane Vol: 66.5 ml 33.71 ml/m  AORTIC VALVE                    PULMONIC VALVE AV Area (Vmax):    1.41 cm     PV Vmax:        0.75 m/s AV Area (Vmean):   1.29 cm     PV Peak grad:   2.2 mmHg AV Area (VTI):     1.78 cm     RVOT Peak grad: 2 mmHg AV Vmax:           158.33 cm/s AV Vmean:          116.000 cm/s AV VTI:            0.337 m AV Peak Grad:      10.0 mmHg AV Mean Grad:      6.0 mmHg LVOT Vmax:         71.10 cm/s LVOT Vmean:        47.600 cm/s LVOT VTI:          0.191 m LVOT/AV VTI ratio: 0.57  AORTA Ao Root diam: 2.83 cm MITRAL VALVE  TRICUSPID VALVE MV Area (PHT): 3.60 cm     TR Peak grad:   33.2 mmHg MV Decel Time: 211 msec     TR Vmax:        288.00 cm/s MV E velocity: 68.60 cm/s MV A velocity: 117.00 cm/s  SHUNTS MV E/A ratio:  0.59         Systemic VTI:  0.19 m                             Systemic Diam: 2.00 cm Isaias Cowman MD Electronically signed by Isaias Cowman MD Signature Date/Time: 03/14/2021/1:55:06 PM    Final    CT Renal Stone Study  Result Date: 03/11/2021 CLINICAL DATA:  70 year old with chronic kidney disease, presenting with a urinary tract infection. EXAM: CT ABDOMEN AND PELVIS WITHOUT CONTRAST TECHNIQUE: Multidetector CT imaging of the abdomen and pelvis was performed following the standard protocol without IV contrast. COMPARISON:  01/09/2021. FINDINGS: Lower chest: LEFT pleural effusion and associated mild passive atelectasis in th, broad-based central disc protrusion and multifactorial spinal stenosis at L4-5, and diffuse degenerative changes e LEFT  LOWER LOBE. Visualized lung bases otherwise clear. Heart moderately enlarged. Small pericardial effusion versus pericardial thickening. Hepatobiliary: Normal unenhanced appearance of the liver. Surgically absent gallbladder. No unexpected biliary ductal dilation. Pancreas: Normal unenhanced appearance. Spleen: Subtle low attenuation involving the lower pole of the spleen, not present on the prior CT, with associated edema in the perisplenic fat. Adrenals/Urinary Tract: Normal appearing adrenal glands. Scarring and mild cortical thinning involving both kidneys. Allowing for the unenhanced technique, no significant focal parenchymal abnormalities involving either kidney. No hydronephrosis. No urinary tract calculi. Normal appearing mildly distended urinary bladder. Stomach/Bowel: Stomach normal in appearance for the degree of distention. Normal-appearing small bowel. Mobile cecum positioned in the RIGHT UPPER QUADRANT. Large rectal colonic stool burden and moderate colonic stool burden elsewhere. High attenuation ingested material within the colon and in the normal appearing appendix located in the RIGHT mid abdomen. No focal colonic abnormality. Vascular/Lymphatic: Mild atherosclerosis involving the abdominal aorta without evidence of aneurysm. No pathologic lymphadenopathy. Reproductive: Normal-appearing uterus and ovaries without evidence of adnexal mass. Other: Mild edema in the abdominal wall overlying both flanks, right greater than left. Musculoskeletal: Degenerative disc disease and spondylosis at L5-S1, broad-based central disc protrusion and multifactorial spinal stenosis at L4-5 and diffuse facet degenerative changes throughout the lumbar spine. No acute findings. IMPRESSION: 1. Subtle low attenuation involving the lower pole of the spleen, not present on the prior CT, with associated edema in the perisplenic fat, possibly indicating splenic infarct or splenic abscess. CT abdomen with contrast may be  confirmatory. If the patient's renal function does not allow intravenous contrast administration, then ultrasound may be helpful in further evaluation. 2. No acute abnormalities otherwise involving the abdomen or pelvis. 3. LEFT pleural effusion and associated mild passive atelectasis in the LEFT LOWER LOBE. 4. Small pericardial effusion versus pericardial thickening. 5. Aortic Atherosclerosis (ICD10-I70.0). Electronically Signed   By: Evangeline Dakin M.D.   On: 03/11/2021 12:12   US ABDOMINAL PELVIC ART/VENT FLOW DOPPLER LIMITED  Result Date: 03/14/2021 CLINICAL DATA:  Evaluate splenic abnormality on recent CT. EXAM: ULTRASOUND ABDOMEN LIMITED WITH DOPPLER TECHNIQUE: Images were obtained at the area of concern at the spleen. COMPARISON:  CT of the abdomen and pelvis 03/11/2021 FINDINGS: Spleen measures 11.8 x 5.3 x 12.7 cm. Calculated splenic volume is 397 mL. Superior and mid portions of the spleen  have normal echotexture. Along the inferior aspect of the spleen there is fluid surrounding a solid structure which has similar echogenicity to the spleen. Suspect this is fluid collection around a small area of abnormal splenic tissue or fluid surrounding a splenic hematoma. These findings correspond with the recent CT findings. This area of concern with surrounding fluid measures 4.1 x 4.0 x 4.5 cm. Arterial and venous flow at the splenic hilum. No significant internal vascularity within the tissue surrounded by fluid. IMPRESSION: Complex fluid collection along the inferior aspect of spleen corresponding with the abnormality on the recent CT. The echogenic material surrounded by fluid could represent abnormal splenic tissue versus hematoma. The fluid collection and CT findings are more suggestive for a hematoma due to a splenic laceration rather than an infarct or abscess. Consider a follow-up ultrasound in 6-8 weeks to see if the fluid collection resolves. Electronically Signed   By: Markus Daft M.D.   On:  03/14/2021 13:23   US SPLEEN (ABDOMEN LIMITED)  Result Date: 03/14/2021 CLINICAL DATA:  Evaluate splenic abnormality on recent CT. EXAM: ULTRASOUND ABDOMEN LIMITED WITH DOPPLER TECHNIQUE: Images were obtained at the area of concern at the spleen. COMPARISON:  CT of the abdomen and pelvis 03/11/2021 FINDINGS: Spleen measures 11.8 x 5.3 x 12.7 cm. Calculated splenic volume is 397 mL. Superior and mid portions of the spleen have normal echotexture. Along the inferior aspect of the spleen there is fluid surrounding a solid structure which has similar echogenicity to the spleen. Suspect this is fluid collection around a small area of abnormal splenic tissue or fluid surrounding a splenic hematoma. These findings correspond with the recent CT findings. This area of concern with surrounding fluid measures 4.1 x 4.0 x 4.5 cm. Arterial and venous flow at the splenic hilum. No significant internal vascularity within the tissue surrounded by fluid. IMPRESSION: Complex fluid collection along the inferior aspect of spleen corresponding with the abnormality on the recent CT. The echogenic material surrounded by fluid could represent abnormal splenic tissue versus hematoma. The fluid collection and CT findings are more suggestive for a hematoma due to a splenic laceration rather than an infarct or abscess. Consider a follow-up ultrasound in 6-8 weeks to see if the fluid collection resolves. Electronically Signed   By: Markus Daft M.D.   On: 03/14/2021 13:23    Assessment & Plan     70 y.o.femalewith history ofhypertension,history of right trimalleoli are fracture,hypothyroid, history of DVT and antiphospholipid antibody syndrome previously on Coumadin, CKD 3B, hyperlipidemia, depression, mild cognitive decline, history of CVA, right PCA infarct, presents emergency department for chief concerns of confusion. This has apparently been there for several weeks.Brain MRI shows shower of emboli. In nsr at present andshe  has been compliant with eliquis. Will need change to warfarin. WIll need neurology input regading heparin for now. Will follow for afib on monitor.  Recommendations;  1 Not a candidate for restarting Eliquis or other NOACs.  2.Acute CVA-appreciate neurology input.   3.Antiphospholipid antibody syndrome.-Hypercoagulable state while on Eliquis may be etiology of this. No evidence of sustained afib on monitor thus far. We will continue to follow. May need long-term monitoring after discharge to assess whether A. fib is present however patient will need warfarin indefinitely for the antiphospholipid antibody syndrome and therefore this is less important. Is in sinus rhythm at present. Will need warfarin indefinitely and bridged if she needs to come off for procedures in the future. Close trending due to abx use.  INR 2.8  yesterday  4. Anemia-patient's hemoglobin remains low but stable at present. No evidence of bleeding.  Will need to keep INR is low therapeutic as possible following for evidence of bleeding.  5. Elevated troponins-flat and is not consistent with acute coronary event. Patient not a candidate for invasive cardiac evaluation at present due to multiple comorbidities including recent CVA, acute renal insufficiency. Will review echo when available.  6.  Abdominal pain-work-up underway.   Signed, Javier Docker Fath MD 03/16/2021, 7:35 AM  Pager: [phone]

## 2021-03-16 NOTE — Progress Notes (Signed)
Nutrition Follow-up  DOCUMENTATION CODES:   Not applicable  INTERVENTION:   -Continue MVI with minerals daily -Continue Ensure Enlive po TID, each supplement provides 350 kcal and 20 grams of protein -Continue Magic cup TID with meals, each supplement provides 290 kcal and 9 grams of protein -Liberalize diet to regular  NUTRITION DIAGNOSIS:   Inadequate oral intake related to lethargy/confusion,poor appetite as evidenced by meal completion < 50%,per patient/family report.  Ongoing  GOAL:   Patient will meet greater than or equal to 90% of their needs  Progressing  MONITOR:   PO intake,Supplement acceptance,Diet advancement,Labs,Weight trends,Skin,I & O's  REASON FOR ASSESSMENT:   Malnutrition Screening Tool    ASSESSMENT:   70 year old female with history of hypertension, hypothyroidism, history of DVT and antiphospholipid syndrome who was previously on Coumadin for 15 years and recently changed to Eliquis, chronic kidney disease stage IIIb with baseline creatinine 1.8, hyperlipidemia, depression, cognitive decline, recent history of stroke and rehab, recent right ankle fracture brought back from nursing home with altered mental status.  Recent hospitalization for stroke, rehab and discharged to the nursing home.  She has been remaining in poor health since then, appetite is poor.  Patient was complaining of midsternal chest pain for about 2 days.  Both spouse at long-term nursing home now.  4/11- s/p BSE- advanced to dysphagia 3 diet with thin liquids 4/12- MRI reveals multiple punctate acute ischemic infarctions in separate vascular territories infratentorially and supratentorially, many of which are cortically-based, most consistent with a cardioembolic etiology; abdominal ultrasound reveals splenic hematoma 4/13- advanced to regular consistency diet 4/14- s/p bone marrow biopsy  Reviewed I/O's: +795 ml x 24 hours and -2.7 L since admission  Pt unavailable at time of  visit. Attempted to speak with pt via call to hospital room phone, however, unable to reach.   Pt was NPO earlier today due to bone marrow biopsy. She is now on a heart healthy diet. RD will liberalize diet for widest selection of food choices. Intake has improved; noted meal completions 25-95%. Pt is consuming Ensure Enlive supplements.   Medications reviewed and include ferrous sulfate, melatonin, miralax, senokot, vitamin B-12, and 0.45% sodium chloride infusion @ 50 ml/hr.   Labs reviewed.   Diet Order:   Diet Order            Diet Heart Room service appropriate? Yes; Fluid consistency: Thin  Diet effective now                 EDUCATION NEEDS:   Education needs have been addressed  Skin:  Skin Assessment: Skin Integrity Issues: Skin Integrity Issues:: Stage II Stage II: lt buttocks  Last BM:  03/15/21  Height:   Ht Readings from Last 1 Encounters:  03/11/21 '5\' 8"'  (1.727 m)    Weight:   Wt Readings from Last 1 Encounters:  03/11/21 83.5 kg    Ideal Body Weight:  63.6 kg  BMI:  Body mass index is 27.98 kg/m.  Estimated Nutritional Needs:   Kcal:  1900-2100  Protein:  95-110 grams  Fluid:  > 1.9 L    [name], RD, LDN, [place] Registered Dietitian II Certified Diabetes Care and Education Specialist Please refer to [facility] for RD and/or RD on-call/weekend/after hours pager

## 2021-03-16 NOTE — Consult Note (Signed)
Chief Complaint: Patient was seen in consultation today for  Chief Complaint  Patient presents with  . Altered Mental Status   Patient Status: [facility] - In-pt  History of Present Illness: [name] is a 70 y.o. female hypertension, hypothyroidism, DVT, antiphospholipid syndrome, chronic kidney disease, hyperlipidemia, depression, cognitive decline, stroke, recent right ankle fracture was readmitted with altered mental status.  Patient was complaining of midsternal chest pain for about 2 days.  IR consulted for bone marrow biopsy for evaluation of anemia and new onset thrombocytopenia.  Past Medical History:  Diagnosis Date  . Arthritis 04/02/1996  . Depression   . GERD (gastroesophageal reflux disease) 04/03/2019  . Hyperlipidemia   . Hypertension   . Renal insufficiency   . Stroke ([place])   . Thyroid disease     Past Surgical History:  Procedure Laterality Date  . CESAREAN SECTION    . CHOLECYSTECTOMY    . COLONOSCOPY  2012   repeat in 10 yrs  . KNEE ARTHROSCOPY W/ MENISCAL REPAIR Left     Allergies: Penicillin g  Medications: Prior to Admission medications   Medication Sig Start Date End Date Taking? Authorizing Provider  acetaminophen (TYLENOL) 325 MG tablet Take 2 tablets (650 mg total) by mouth every 4 (four) hours as needed for mild pain (or temp > 37.5 C (99.5 F)). 01/19/21  Yes Angiulli, Lavon Paganini, PA-C  apixaban (ELIQUIS) 5 MG TABS tablet Take 1 tablet (5 mg total) by mouth 2 (two) times daily. 01/19/21  Yes Angiulli, Lavon Paganini, PA-C  ascorbic acid (VITAMIN C) 500 MG tablet Take 500 mg by mouth 2 (two) times daily.   Yes [provider]  atorvastatin (LIPITOR) 20 MG tablet Take 1 tablet (20 mg total) by mouth daily. Patient taking differently: Take 20 mg by mouth every evening. 01/20/21  Yes Angiulli, Lavon Paganini, PA-C  escitalopram (LEXAPRO) 20 MG tablet Take 1 tablet (20 mg total) by mouth daily. 12/12/20  Yes Juline Patch, MD  famotidine (PEPCID)  20 MG tablet Take 20 mg by mouth daily.   Yes [provider]  ferrous sulfate 325 (65 FE) MG EC tablet Take 1 tablet (325 mg total) by mouth daily with breakfast. Patient taking differently: Take 325 mg by mouth at bedtime. 12/12/20  Yes Juline Patch, MD  levothyroxine (SYNTHROID) 100 MCG tablet Take 1 tablet (100 mcg total) by mouth daily at 6 (six) AM. 12/30/20  Yes Lorella Nimrod, MD  loperamide (IMODIUM) 2 MG capsule Take 1 capsule (2 mg total) by mouth every 6 (six) hours as needed for diarrhea or loose stools. 12/29/20  Yes Lorella Nimrod, MD  montelukast (SINGULAIR) 10 MG tablet TAKE 1 TABLET BY MOUTH EVERYDAY AT BEDTIME Patient taking differently: Take 10 mg by mouth at bedtime. TAKE 1 TABLET BY MOUTH EVERYDAY AT BEDTIME 03/11/21  Yes Juline Patch, MD  ondansetron (ZOFRAN-ODT) 4 MG disintegrating tablet Take 4 mg by mouth every 6 (six) hours as needed for nausea or vomiting.   Yes [provider]  oxybutynin (DITROPAN XL) 15 MG 24 hr tablet Take 1 tablet (15 mg total) by mouth at bedtime. 01/19/21  Yes Angiulli, Lavon Paganini, PA-C  sodium chloride (OCEAN) 0.65 % SOLN nasal spray Place 2 sprays into both nostrils every 6 (six) hours.   Yes [provider]  traMADol (ULTRAM) 50 MG tablet Take 50 mg by mouth every 6 (six) hours.   Yes [provider]  traZODone (DESYREL) 100 MG tablet Take 1  tablet (100 mg total) by mouth at bedtime. 01/19/21  Yes Angiulli, Lavon Paganini, PA-C  vitamin B-12 (CYANOCOBALAMIN) 1000 MCG tablet Take 1,000 mcg by mouth daily.   Yes [provider]  buPROPion (WELLBUTRIN XL) 150 MG 24 hr tablet Take 1 tablet (150 mg total) by mouth daily. Patient not taking: No sig reported 02/08/21   Juline Patch, MD  diclofenac Sodium (VOLTAREN) 1 % GEL Apply 2 g topically 4 (four) times daily as needed (left knee pain). Patient not taking: No sig reported 01/19/21   Angiulli, Lavon Paganini, PA-C  neomycin-bacitracin-polymyxin (NEOSPORIN) OINT Apply 1  application topically 2 (two) times daily. Patient not taking: No sig reported 01/19/21   Angiulli, Lavon Paganini, PA-C     Family History  Problem Relation Age of Onset  . Stroke Father   . Breast cancer Maternal Grandmother   . Kidney cancer Mother   . Cancer Mother   . Arthritis Brother     Social History   Socioeconomic History  . Marital status: Married    Spouse name: Not on file  . Number of children: 1  . Years of education: Not on file  . Highest education level: Master's degree (e.g., MA, MS, MEng, MEd, MSW, MBA)  Occupational History  . Occupation: Retired  Tobacco Use  . Smoking status: Never Smoker  . Smokeless tobacco: Never Used  . Tobacco comment: none  Vaping Use  . Vaping Use: Never used  Substance and Sexual Activity  . Alcohol use: Yes    Alcohol/week: 1.0 standard drink    Types: 1 Glasses of wine per week    Comment: occasional drink  . Drug use: No  . Sexual activity: Not Currently    Birth control/protection: Post-menopausal  Other Topics Concern  . Not on file  Social History Narrative  . Not on file   Social Determinants of Health   Financial Resource Strain: Not on file  Food Insecurity: Not on file  Transportation Needs: Not on file  Physical Activity: Not on file  Stress: Not on file  Social Connections: Not on file    Review of Systems: A 12 point ROS discussed and pertinent positives are indicated in the HPI above.  All other systems are negative.  Review of Systems  Vital Signs: BP 129/83 (BP Location: Left Arm)   Pulse 92   Temp 97.7 F (36.5 C)   Resp 17   Ht '5\' 8"'  (1.727 m)   Wt 83.5 kg   SpO2 100%   BMI 27.98 kg/m   Physical Exam Constitutional:      Appearance: Normal appearance.  HENT:     Mouth/Throat:     Mouth: Mucous membranes are moist.     Pharynx: Oropharynx is clear.  Cardiovascular:     Rate and Rhythm: Normal rate and regular rhythm.  Pulmonary:     Effort: Pulmonary effort is normal.     Breath  sounds: Normal breath sounds.  Abdominal:     Palpations: Abdomen is soft.  Skin:    General: Skin is warm and dry.  Neurological:     Mental Status: She is alert.  Psychiatric:        Mood and Affect: Mood normal.     Imaging: CT ABDOMEN PELVIS WO CONTRAST  Result Date: 03/14/2021 CLINICAL DATA:  Left upper quadrant abdominal pain. EXAM: CT ABDOMEN AND PELVIS WITHOUT CONTRAST TECHNIQUE: Multidetector CT imaging of the abdomen and pelvis was performed following the standard protocol without IV  contrast. COMPARISON:  Ultrasound of same day. CT of March 11, 2021 and January 09, 2021. FINDINGS: Lower chest: Small left pleural effusion is noted with minimal adjacent subsegmental atelectasis. Hepatobiliary: No focal liver abnormality is seen. Status post cholecystectomy. No biliary dilatation. Pancreas: Unremarkable. No pancreatic ductal dilatation or surrounding inflammatory changes. Spleen: Stable size and appearance of oval-shaped low density involving inferior and anterior portion of the spleen most consistent with subcapsular hematoma. Infarction is felt to be less likely. Adrenals/Urinary Tract: 2 cm right adrenal nodule is noted which is unchanged compared to prior exams. Left adrenal gland is unremarkable. Bilateral renal cortical thinning and scarring is noted. No hydronephrosis or renal obstruction is noted. No renal or ureteral calculi are noted. Urinary bladder is unremarkable. Stomach/Bowel: The stomach and appendix appear normal. There is no evidence of bowel obstruction. No inflammation is noted. Large amount of stool is noted in the distal sigmoid colon and rectum concerning for impaction. Vascular/Lymphatic: Aortic atherosclerosis. No enlarged abdominal or pelvic lymph nodes. Reproductive: Uterus and bilateral adnexa are unremarkable. Other: No abdominal wall hernia or abnormality. No abdominopelvic ascites. Musculoskeletal: No acute or significant osseous findings. IMPRESSION: Grossly  stable size and appearance of oval-shaped low density involving inferior anterior portion of the spleen most consistent with subcapsular hematoma. Infarction is felt to be less likely. As recommended by ultrasound exam today, follow-up ultrasound in several weeks is recommended to ensure stability or resolution of this abnormality. These results were called by telephone at the time of interpretation on 03/14/2021 at 6:17 pm to provider [facility] , who verbally acknowledged these results. 2 cm right adrenal nodule is noted. Follow-up CT or MRI in 12 months is recommended to rule out neoplasm. Large amount of stool is noted in the distal sigmoid colon and rectum concerning for impaction. Aortic Atherosclerosis (ICD10-I70.0). Electronically Signed   By: Marijo Conception M.D.   On: 03/14/2021 18:17   DG Chest 2 View  Result Date: 03/11/2021 CLINICAL DATA:  Confusion EXAM: CHEST - 2 VIEW COMPARISON:  January 08, 2021 FINDINGS: Interstitium is mildly thickened. No edema or airspace opacity. Heart size and pulmonary vascularity are normal. No adenopathy. No bone lesions. Surgical clips noted in gallbladder fossa region. IMPRESSION: Interstitial thickening, likely indicative of underlying chronic bronchitis. No edema or airspace opacity. Cardiac silhouette normal. Electronically Signed   By: Lowella Grip III M.D.   On: 03/11/2021 09:56   DG Ankle Complete Right  Result Date: 02/22/2021 CLINICAL DATA:  Right ankle pain, fell EXAM: RIGHT ANKLE - COMPLETE 3+ VIEW COMPARISON:  05/23/2020 FINDINGS: Frontal, oblique, lateral views of the right ankle are obtained. There is an oblique displaced lateral malleolar fracture, with lateral subluxation of the talus relative to the tibial plafond. A minimally comminuted transverse medial malleolar fracture is also noted, with mild distraction. The minimally displaced fracture through the posterior malleolus is noted on lateral view. There is diffuse soft tissue swelling.  Large inferior calcaneal spur. IMPRESSION: 1. Trimalleolar right ankle fracture, with lateral subluxation of the talus. 2. Diffuse soft tissue swelling. Electronically Signed   By: Randa Ngo M.D.   On: 02/22/2021 20:41   CT Head Wo Contrast  Result Date: 03/11/2021 CLINICAL DATA:  Altered mental status/confusion EXAM: CT HEAD WITHOUT CONTRAST TECHNIQUE: Contiguous axial images were obtained from the base of the skull through the vertex without intravenous contrast. COMPARISON:  February 22, 2021 FINDINGS: Brain: Moderate diffuse atrophy is stable. There is no intracranial mass, hemorrhage, extra-axial fluid collection, or midline  shift. Decreased attenuation is noted with encephalomalacia in the medial right occipital lobe consistent with prior infarct. Prior infarct also noted in the posterosuperior right parietal lobe, stable. A prior infarct is noted in the posterior aspect of the mid left cerebellum, stable. There is mild decreased attenuation in portions of the centra semiovale bilaterally. No acute appearing infarct is evident. Vascular: There is no hyperdense vessel. There is calcification in each carotid siphon region. Skull: The bony calvarium appears intact. Sinuses/Orbits: There is slight mucosal thickening in several ethmoid air cells. Visualized orbits appear symmetric bilaterally. Other: Mastoid air cells are clear. IMPRESSION: Stable atrophy. Prior infarcts in the medial right occipital lobe, superior posterior right parietal lobe, and posterior mid left cerebellum. Decreased attenuation in the periventricular white matter is likely due to small vessel vascular disease, although a mild degree of interstitial edema secondary to the ventricular enlargement could present in this manner. No acute infarct evident. No mass or hemorrhage. There are foci of arterial vascular calcification. There is slight mucosal thickening in several ethmoid air cells. Electronically Signed   By: Lowella Grip III  M.D.   On: 03/11/2021 10:00   CT Head Wo Contrast  Result Date: 02/22/2021 CLINICAL DATA:  [place]. EXAM: CT HEAD WITHOUT CONTRAST CT CERVICAL SPINE WITHOUT CONTRAST TECHNIQUE: Multidetector CT imaging of the head and cervical spine was performed following the standard protocol without intravenous contrast. Multiplanar CT image reconstructions of the cervical spine were also generated. COMPARISON:  None. FINDINGS: CT HEAD FINDINGS Brain: Age advanced cerebral atrophy and associated ventriculomegaly. Evidence of multiple prior infarcts most notably in the right occipital lobe with associated encephalomalacia and ex vacuo dilatation of the occipital horn of the right lateral ventricle. I do not see any findings suggestive of an acute hemispheric infarction. No intracranial hemorrhage. No extra-axial fluid collections. Vascular: Vascular calcifications but no definite aneurysm or hyperdense vessels. Skull: No skull fracture or bone lesions. Sinuses/Orbits: The paranasal sinuses and mastoid air cells are clear. The globes are intact. Other: No scalp lesions or scalp hematoma. CT CERVICAL SPINE FINDINGS Alignment: Normal overall alignment of the cervical vertebral bodies. Skull base and vertebrae: No acute fracture. No primary bone lesion or focal pathologic process. Soft tissues and spinal canal: No prevertebral fluid or swelling. No visible canal hematoma. Disc levels: Degenerative cervical spondylosis with disc disease and facet disease most notable at C4-5 and C5-6. No large disc protrusions or significant spinal stenosis. Uncinate spurring and facet disease but no significant foraminal stenosis. Upper chest: The visualized lung apices are grossly clear. Other: No neck mass, adenopathy or hematoma. IMPRESSION: 1. Age advanced cerebral atrophy and associated ventriculomegaly. 2. Evidence of multiple prior infarcts. 3. No acute intracranial findings or skull fracture. 4. Degenerative cervical spondylosis with disc  disease and facet disease but no acute cervical spine fracture. Electronically Signed   By: Marijo Sanes M.D.   On: 02/22/2021 21:52   CT Cervical Spine Wo Contrast  Result Date: 02/22/2021 CLINICAL DATA:  [place]. EXAM: CT HEAD WITHOUT CONTRAST CT CERVICAL SPINE WITHOUT CONTRAST TECHNIQUE: Multidetector CT imaging of the head and cervical spine was performed following the standard protocol without intravenous contrast. Multiplanar CT image reconstructions of the cervical spine were also generated. COMPARISON:  None. FINDINGS: CT HEAD FINDINGS Brain: Age advanced cerebral atrophy and associated ventriculomegaly. Evidence of multiple prior infarcts most notably in the right occipital lobe with associated encephalomalacia and ex vacuo dilatation of the occipital horn of the right lateral ventricle. I do not see  any findings suggestive of an acute hemispheric infarction. No intracranial hemorrhage. No extra-axial fluid collections. Vascular: Vascular calcifications but no definite aneurysm or hyperdense vessels. Skull: No skull fracture or bone lesions. Sinuses/Orbits: The paranasal sinuses and mastoid air cells are clear. The globes are intact. Other: No scalp lesions or scalp hematoma. CT CERVICAL SPINE FINDINGS Alignment: Normal overall alignment of the cervical vertebral bodies. Skull base and vertebrae: No acute fracture. No primary bone lesion or focal pathologic process. Soft tissues and spinal canal: No prevertebral fluid or swelling. No visible canal hematoma. Disc levels: Degenerative cervical spondylosis with disc disease and facet disease most notable at C4-5 and C5-6. No large disc protrusions or significant spinal stenosis. Uncinate spurring and facet disease but no significant foraminal stenosis. Upper chest: The visualized lung apices are grossly clear. Other: No neck mass, adenopathy or hematoma. IMPRESSION: 1. Age advanced cerebral atrophy and associated ventriculomegaly. 2. Evidence of multiple  prior infarcts. 3. No acute intracranial findings or skull fracture. 4. Degenerative cervical spondylosis with disc disease and facet disease but no acute cervical spine fracture. Electronically Signed   By: Marijo Sanes M.D.   On: 02/22/2021 21:52   MR BRAIN WO CONTRAST  Result Date: 03/12/2021 CLINICAL DATA:  Confusion. Recent history of stroke. Hypercoagulable. EXAM: MRI HEAD WITHOUT CONTRAST TECHNIQUE: Multiplanar, multiecho pulse sequences of the brain and surrounding structures were obtained without intravenous contrast. COMPARISON:  12/26/2020 FINDINGS: Brain: Innumerable subcentimeter mainly cortically based acute infarcts along the bilateral cerebral convexity with predilection for the watershed regions. Similar size infarcts are seen in the bilateral cerebellum. Numerous pre-existing cerebellar and cortical infarcts with generalized brain atrophy. Large remote right occipital infarct with dense encephalomalacia. No acute hemorrhage, hydrocephalus, or mass. Vascular: Major flow voids are preserved Skull and upper cervical spine: Normal marrow signal Sinuses/Orbits: Negative IMPRESSION: 1. Shower of embolic pattern acute infarcts involving the infra and supratentorial brain. 2. Extensive chronic ischemic injury. Electronically Signed   By: Monte Fantasia M.D.   On: 03/12/2021 11:52   ECHOCARDIOGRAM COMPLETE  Result Date: 03/14/2021    ECHOCARDIOGRAM REPORT   Patient Name:   RANYAH GROENEVELD Date of Exam: 03/14/2021 Medical Rec #:  [id number]          Height:       68.0 in Accession #:    [id number]         Weight:       184.0 lb Date of Birth:  24-Jan-1951         BSA:          1.973 m Patient Age:    70 years           BP:           147/76 mmHg Patient Gender: F                  HR:           101 bpm. Exam Location:  [facility] Procedure: 2D Echo, Cardiac Doppler and Color Doppler Indications:     Stroke I63.9  History:         Patient has prior history of Echocardiogram examinations, most                   recent 12/27/2020. Stroke; Risk Factors:Hypertension.  Sonographer:     Sherrie Sport RDCS (AE) Referring Phys:  [id number] Barb Merino Diagnosing Phys: Isaias Cowman MD IMPRESSIONS  1. Left ventricular ejection fraction, by estimation, is 50 to 55%. The  left ventricle has low normal function. The left ventricle has no regional wall motion abnormalities. Left ventricular diastolic parameters are consistent with Grade II diastolic dysfunction (pseudonormalization).  2. Right ventricular systolic function is normal. The right ventricular size is normal.  3. The mitral valve is normal in structure. Mild to moderate mitral valve regurgitation. No evidence of mitral stenosis.  4. The aortic valve is normal in structure. Aortic valve regurgitation is mild. No aortic stenosis is present.  5. The inferior vena cava is normal in size with greater than 50% respiratory variability, suggesting right atrial pressure of 3 mmHg. FINDINGS  Left Ventricle: Left ventricular ejection fraction, by estimation, is 50 to 55%. The left ventricle has low normal function. The left ventricle has no regional wall motion abnormalities. The left ventricular internal cavity size was normal in size. There is no left ventricular hypertrophy. Left ventricular diastolic parameters are consistent with Grade II diastolic dysfunction (pseudonormalization). Right Ventricle: The right ventricular size is normal. No increase in right ventricular wall thickness. Right ventricular systolic function is normal. Left Atrium: Left atrial size was normal in size. Right Atrium: Right atrial size was normal in size. Pericardium: There is no evidence of pericardial effusion. Mitral Valve: The mitral valve is normal in structure. Mild to moderate mitral valve regurgitation. No evidence of mitral valve stenosis. Tricuspid Valve: The tricuspid valve is normal in structure. Tricuspid valve regurgitation is mild . No evidence of tricuspid stenosis. Aortic Valve: The  aortic valve is normal in structure. Aortic valve regurgitation is mild. No aortic stenosis is present. Aortic valve mean gradient measures 6.0 mmHg. Aortic valve peak gradient measures 10.0 mmHg. Aortic valve area, by VTI measures 1.78  cm. Pulmonic Valve: The pulmonic valve was normal in structure. Pulmonic valve regurgitation is not visualized. No evidence of pulmonic stenosis. Aorta: The aortic root is normal in size and structure. Venous: The inferior vena cava is normal in size with greater than 50% respiratory variability, suggesting right atrial pressure of 3 mmHg. IAS/Shunts: No atrial level shunt detected by color flow Doppler.  LEFT VENTRICLE PLAX 2D LVIDd:         4.40 cm  Diastology LVIDs:         3.26 cm  LV e' medial:    6.64 cm/s LV PW:         0.98 cm  LV E/e' medial:  10.3 LV IVS:        0.93 cm  LV e' lateral:   5.00 cm/s LVOT diam:     2.00 cm  LV E/e' lateral: 13.7 LV SV:         60 LV SV Index:   30 LVOT Area:     3.14 cm  RIGHT VENTRICLE RV Basal diam:  2.97 cm RV S prime:     9.57 cm/s TAPSE (M-mode): 3.1 cm LEFT ATRIUM             Index       RIGHT ATRIUM           Index LA diam:        4.00 cm 2.03 cm/m  RA Area:     13.90 cm LA Vol (A2C):   60.5 ml 30.67 ml/m RA Volume:   31.30 ml  15.87 ml/m LA Vol (A4C):   62.6 ml 31.73 ml/m LA Biplane Vol: 66.5 ml 33.71 ml/m  AORTIC VALVE  PULMONIC VALVE AV Area (Vmax):    1.41 cm     PV Vmax:        0.75 m/s AV Area (Vmean):   1.29 cm     PV Peak grad:   2.2 mmHg AV Area (VTI):     1.78 cm     RVOT Peak grad: 2 mmHg AV Vmax:           158.33 cm/s AV Vmean:          116.000 cm/s AV VTI:            0.337 m AV Peak Grad:      10.0 mmHg AV Mean Grad:      6.0 mmHg LVOT Vmax:         71.10 cm/s LVOT Vmean:        47.600 cm/s LVOT VTI:          0.191 m LVOT/AV VTI ratio: 0.57  AORTA Ao Root diam: 2.83 cm MITRAL VALVE                TRICUSPID VALVE MV Area (PHT): 3.60 cm     TR Peak grad:   33.2 mmHg MV Decel Time: 211 msec      TR Vmax:        288.00 cm/s MV E velocity: 68.60 cm/s MV A velocity: 117.00 cm/s  SHUNTS MV E/A ratio:  0.59         Systemic VTI:  0.19 m                             Systemic Diam: 2.00 cm Isaias Cowman MD Electronically signed by Isaias Cowman MD Signature Date/Time: 03/14/2021/1:55:06 PM    Final    CT Renal Stone Study  Result Date: 03/11/2021 CLINICAL DATA:  70 year old with chronic kidney disease, presenting with a urinary tract infection. EXAM: CT ABDOMEN AND PELVIS WITHOUT CONTRAST TECHNIQUE: Multidetector CT imaging of the abdomen and pelvis was performed following the standard protocol without IV contrast. COMPARISON:  01/09/2021. FINDINGS: Lower chest: LEFT pleural effusion and associated mild passive atelectasis in th, broad-based central disc protrusion and multifactorial spinal stenosis at L4-5, and diffuse degenerative changes e LEFT LOWER LOBE. Visualized lung bases otherwise clear. Heart moderately enlarged. Small pericardial effusion versus pericardial thickening. Hepatobiliary: Normal unenhanced appearance of the liver. Surgically absent gallbladder. No unexpected biliary ductal dilation. Pancreas: Normal unenhanced appearance. Spleen: Subtle low attenuation involving the lower pole of the spleen, not present on the prior CT, with associated edema in the perisplenic fat. Adrenals/Urinary Tract: Normal appearing adrenal glands. Scarring and mild cortical thinning involving both kidneys. Allowing for the unenhanced technique, no significant focal parenchymal abnormalities involving either kidney. No hydronephrosis. No urinary tract calculi. Normal appearing mildly distended urinary bladder. Stomach/Bowel: Stomach normal in appearance for the degree of distention. Normal-appearing small bowel. Mobile cecum positioned in the RIGHT UPPER QUADRANT. Large rectal colonic stool burden and moderate colonic stool burden elsewhere. High attenuation ingested material within the colon and in the  normal appearing appendix located in the RIGHT mid abdomen. No focal colonic abnormality. Vascular/Lymphatic: Mild atherosclerosis involving the abdominal aorta without evidence of aneurysm. No pathologic lymphadenopathy. Reproductive: Normal-appearing uterus and ovaries without evidence of adnexal mass. Other: Mild edema in the abdominal wall overlying both flanks, right greater than left. Musculoskeletal: Degenerative disc disease and spondylosis at L5-S1, broad-based central disc protrusion and multifactorial spinal stenosis at L4-5 and diffuse facet  degenerative changes throughout the lumbar spine. No acute findings. IMPRESSION: 1. Subtle low attenuation involving the lower pole of the spleen, not present on the prior CT, with associated edema in the perisplenic fat, possibly indicating splenic infarct or splenic abscess. CT abdomen with contrast may be confirmatory. If the patient's renal function does not allow intravenous contrast administration, then ultrasound may be helpful in further evaluation. 2. No acute abnormalities otherwise involving the abdomen or pelvis. 3. LEFT pleural effusion and associated mild passive atelectasis in the LEFT LOWER LOBE. 4. Small pericardial effusion versus pericardial thickening. 5. Aortic Atherosclerosis (ICD10-I70.0). Electronically Signed   By: Evangeline Dakin M.D.   On: 03/11/2021 12:12   US ABDOMINAL PELVIC ART/VENT FLOW DOPPLER LIMITED  Result Date: 03/14/2021 CLINICAL DATA:  Evaluate splenic abnormality on recent CT. EXAM: ULTRASOUND ABDOMEN LIMITED WITH DOPPLER TECHNIQUE: Images were obtained at the area of concern at the spleen. COMPARISON:  CT of the abdomen and pelvis 03/11/2021 FINDINGS: Spleen measures 11.8 x 5.3 x 12.7 cm. Calculated splenic volume is 397 mL. Superior and mid portions of the spleen have normal echotexture. Along the inferior aspect of the spleen there is fluid surrounding a solid structure which has similar echogenicity to the spleen.  Suspect this is fluid collection around a small area of abnormal splenic tissue or fluid surrounding a splenic hematoma. These findings correspond with the recent CT findings. This area of concern with surrounding fluid measures 4.1 x 4.0 x 4.5 cm. Arterial and venous flow at the splenic hilum. No significant internal vascularity within the tissue surrounded by fluid. IMPRESSION: Complex fluid collection along the inferior aspect of spleen corresponding with the abnormality on the recent CT. The echogenic material surrounded by fluid could represent abnormal splenic tissue versus hematoma. The fluid collection and CT findings are more suggestive for a hematoma due to a splenic laceration rather than an infarct or abscess. Consider a follow-up ultrasound in 6-8 weeks to see if the fluid collection resolves. Electronically Signed   By: Markus Daft M.D.   On: 03/14/2021 13:23   US SPLEEN (ABDOMEN LIMITED)  Result Date: 03/14/2021 CLINICAL DATA:  Evaluate splenic abnormality on recent CT. EXAM: ULTRASOUND ABDOMEN LIMITED WITH DOPPLER TECHNIQUE: Images were obtained at the area of concern at the spleen. COMPARISON:  CT of the abdomen and pelvis 03/11/2021 FINDINGS: Spleen measures 11.8 x 5.3 x 12.7 cm. Calculated splenic volume is 397 mL. Superior and mid portions of the spleen have normal echotexture. Along the inferior aspect of the spleen there is fluid surrounding a solid structure which has similar echogenicity to the spleen. Suspect this is fluid collection around a small area of abnormal splenic tissue or fluid surrounding a splenic hematoma. These findings correspond with the recent CT findings. This area of concern with surrounding fluid measures 4.1 x 4.0 x 4.5 cm. Arterial and venous flow at the splenic hilum. No significant internal vascularity within the tissue surrounded by fluid. IMPRESSION: Complex fluid collection along the inferior aspect of spleen corresponding with the abnormality on the recent CT.  The echogenic material surrounded by fluid could represent abnormal splenic tissue versus hematoma. The fluid collection and CT findings are more suggestive for a hematoma due to a splenic laceration rather than an infarct or abscess. Consider a follow-up ultrasound in 6-8 weeks to see if the fluid collection resolves. Electronically Signed   By: Markus Daft M.D.   On: 03/14/2021 13:23    Labs:  CBC: Recent Labs    03/13/21 0402  03/14/21 0457 03/15/21 0443 03/16/21 0547  WBC 9.5 8.5 12.5* 8.0  HGB 7.6* 7.3* 7.9* 7.7*  HCT 23.4* 22.0* 23.8* 24.1*  PLT 78* 94* 131* 143*    COAGS: Recent Labs    03/11/21 2332 03/12/21 1044 03/12/21 1852 03/13/21 0402 03/14/21 0457 03/15/21 0443 03/16/21 0547  INR  --   --   --  1.4* 1.8* 2.8* 2.8*  APTT >200* >200* >160* 133*  --   --   --     BMP: Recent Labs    03/12/21 0553 03/13/21 0402 03/14/21 0457 03/15/21 0443  NA 137 136 136 135  K 4.1 4.0 3.7 3.5  CL 103 104 104 101  CO2 '24 24 25 24  ' GLUCOSE 88 109* 98 109*  BUN 31* 26* 21 20  CALCIUM 9.5 9.7 9.4 9.8  CREATININE 1.80* 1.77* 1.84* 1.82*  GFRNONAA 30* 31* 29* 30*    LIVER FUNCTION TESTS: Recent Labs    12/30/20 0709 01/08/21 1753 02/22/21 2020 03/11/21 0926  BILITOT 1.0 1.2 1.1 0.9  AST 28 47* 31 46*  ALT 18 44 9 44  ALKPHOS 66 69 76 262*  PROT 6.6 6.1* 7.1 7.2  ALBUMIN 3.6 3.0* 3.3* 2.7*    TUMOR MARKERS: No results for input(s): AFPTM, CEA, CA199, CHROMGRNA in the last 8760 hours.  Assessment and Plan:  70 year old woman with multiple medical issues presents to IR for bone marrow biopsy and aspiration for evaluation of anemia and thrombocytopenia.  She is unable to provide consent.  The procedure was discussed with her daughter Ramiro Harvest and she provided consent.  Risks and benefits of bone marrow biopsy and aspiration was discussed with the patient's daughter including, but not limited to bleeding, infection, damage to adjacent structures or low yield  requiring additional tests.  All of the questions were answered and there is agreement to proceed.  Consent signed and in chart.  Thank you for this interesting consult.  I greatly enjoyed meeting [facility] and look forward to participating in their care.  A copy of this report was sent to the requesting provider on this date.  Electronically Signed: Paula Libra Areliz Rothman, MD 03/16/2021, 9:16 AM   I spent a total of 20 Minutes    in face to face in clinical consultation, greater than 50% of which was counseling/coordinating care for bone marrow biopsy and aspiration.

## 2021-03-16 NOTE — Consult Note (Signed)
[place] for lovenox + warfarin  Indication: chest pain/ACS, embolic stroke, and antiphospholipid antibody   Patient Measurements: Height: 5\' 8"  (172.7 cm) Weight: 83.5 kg (184 lb) IBW/kg (Calculated) : 63.9 Heparin Dosing Weight: 81 kg  Vital Signs: Temp: 98.6 F (37 C) (04/14 0426) Temp Source: Oral (04/13 2339) BP: 137/78 (04/14 0426) Pulse Rate: 96 (04/14 0426)  Labs: Recent Labs    03/14/21 0457 03/15/21 0443 03/16/21 0547  HGB 7.3* 7.9* 7.7*  HCT 22.0* 23.8* 24.1*  PLT 94* 131* 143*  LABPROT 20.4* 29.8* 29.1*  INR 1.8* 2.8* 2.8*  CREATININE 1.84* 1.82*  --     Estimated Creatinine Clearance: 33 mL/min (A) (by C-G formula based on SCr of 1.82 mg/dL (H)).   Medical History: Past Medical History:  Diagnosis Date  . Arthritis 04/02/1996  . Depression   . GERD (gastroesophageal reflux disease) 04/03/2019  . Hyperlipidemia   . Hypertension   . Renal insufficiency   . Stroke ([place])   . Thyroid disease     Medications:  Medications Prior to Admission  Medication Sig Dispense Refill Last Dose  . acetaminophen (TYLENOL) 325 MG tablet Take 2 tablets (650 mg total) by mouth every 4 (four) hours as needed for mild pain (or temp > 37.5 C (99.5 F)).   Unknown at PRN  . apixaban (ELIQUIS) 5 MG TABS tablet Take 1 tablet (5 mg total) by mouth 2 (two) times daily. 60 tablet  03/10/2021 at 1800  . ascorbic acid (VITAMIN C) 500 MG tablet Take 500 mg by mouth 2 (two) times daily.     Marland Kitchen atorvastatin (LIPITOR) 20 MG tablet Take 1 tablet (20 mg total) by mouth daily. (Patient taking differently: Take 20 mg by mouth every evening.)   03/10/2021 at 1800  . escitalopram (LEXAPRO) 20 MG tablet Take 1 tablet (20 mg total) by mouth daily. 90 tablet 0 03/10/2021 at 0900  . famotidine (PEPCID) 20 MG tablet Take 20 mg by mouth daily.     . ferrous sulfate 325 (65 FE) MG EC tablet Take 1 tablet (325 mg total) by mouth daily with breakfast. (Patient taking  differently: Take 325 mg by mouth at bedtime.) 30 tablet 3   . levothyroxine (SYNTHROID) 100 MCG tablet Take 1 tablet (100 mcg total) by mouth daily at 6 (six) AM.   03/10/2021 at 0600  . loperamide (IMODIUM) 2 MG capsule Take 1 capsule (2 mg total) by mouth every 6 (six) hours as needed for diarrhea or loose stools. 30 capsule 0 Unknown at PRN  . montelukast (SINGULAIR) 10 MG tablet TAKE 1 TABLET BY MOUTH EVERYDAY AT BEDTIME (Patient taking differently: Take 10 mg by mouth at bedtime. TAKE 1 TABLET BY MOUTH EVERYDAY AT BEDTIME) 90 tablet 3 03/10/2021 at 2000  . ondansetron (ZOFRAN-ODT) 4 MG disintegrating tablet Take 4 mg by mouth every 6 (six) hours as needed for nausea or vomiting.   Unknown at PRN  . oxybutynin (DITROPAN XL) 15 MG 24 hr tablet Take 1 tablet (15 mg total) by mouth at bedtime.   03/10/2021 at 2000  . sodium chloride (OCEAN) 0.65 % SOLN nasal spray Place 2 sprays into both nostrils every 6 (six) hours.     . traMADol (ULTRAM) 50 MG tablet Take 50 mg by mouth every 6 (six) hours.     . traZODone (DESYREL) 100 MG tablet Take 1 tablet (100 mg total) by mouth at bedtime.   03/10/2021 at 2000  . vitamin B-12 (  CYANOCOBALAMIN) 1000 MCG tablet Take 1,000 mcg by mouth daily.     Marland Kitchen buPROPion (WELLBUTRIN XL) 150 MG 24 hr tablet Take 1 tablet (150 mg total) by mouth daily. (Patient not taking: No sig reported) 30 tablet 0 Not Taking at Unknown time  . diclofenac Sodium (VOLTAREN) 1 % GEL Apply 2 g topically 4 (four) times daily as needed (left knee pain). (Patient not taking: No sig reported)   Not Taking at Unknown time  . neomycin-bacitracin-polymyxin (NEOSPORIN) OINT Apply 1 application topically 2 (two) times daily. (Patient not taking: No sig reported)   Not Taking at Unknown time   Scheduled:  . atorvastatin  20 mg Oral QHS  . buPROPion  150 mg Oral Daily  . escitalopram  20 mg Oral Daily  . famotidine  20 mg Oral Daily  . feeding supplement  237 mL Oral TID BM  . ferrous sulfate  325 mg Oral  QHS  . levothyroxine  100 mcg Oral Q0600  . lidocaine  1 patch Transdermal Q24H  . mouth rinse  15 mL Mouth Rinse BID  . melatonin  5 mg Oral QHS  . montelukast  10 mg Oral QHS  . multivitamin with minerals   Oral Daily  . oxybutynin  15 mg Oral QHS  . polyethylene glycol  17 g Oral BID  . senna-docusate  2 tablet Oral BID  . vitamin B-12  1,000 mcg Oral Daily  . Warfarin - Pharmacist Dosing Inpatient   Does not apply q1600   Infusions:  . sodium chloride 50 mL/hr at 03/16/21 0521   PRN: haloperidol, ondansetron **OR** ondansetron (ZOFRAN) IV, oxyCODONE, sodium phosphate, traZODone Anti-infectives (From admission, onward)   Start     Dose/Rate Route Frequency Ordered Stop   03/12/21 1000  cefTRIAXone (ROCEPHIN) 1 g in sodium chloride 0.9 % 100 mL IVPB  Status:  Discontinued        1 g 200 mL/hr over 30 Minutes Intravenous Every 24 hours 03/11/21 2223 03/13/21 1402   03/11/21 2315  azithromycin (ZITHROMAX) 500 mg in sodium chloride 0.9 % 250 mL IVPB  Status:  Discontinued        500 mg 250 mL/hr over 60 Minutes Intravenous Every 24 hours 03/11/21 2223 03/12/21 0842   03/11/21 1100  cefTRIAXone (ROCEPHIN) 1 g in sodium chloride 0.9 % 100 mL IVPB        1 g 200 mL/hr over 30 Minutes Intravenous  Once 03/11/21 1056 03/11/21 1131      Assessment: Pharmacy consulted for ACS. Trop elevated on admission at 543.  She was on Coumadin prior to her stroke, and while she was inpatient in [facility] acute rehab her consulting physician switched her over to Eliquis. Now, medical team is switching patient back to warfarin. Patient was on prescribed warfarin prior to apixaban for antiphospholipid antibody syndrome. Prior notes state goal INR was 2.5 to 3.5. Per  Dr. Ubaldo Glassing, recommended to have goal INR on the lower end of 2 to 3, so will aim for this while inpatient. She underwent a bone biopsy this morning  DDIs: APAP (increase bleeding), escitalopram (increase bleeding), hypothroidism (decreases  effect of warfarin)  Home regimen in January 2022 was warfarin 2mg : Tu,Th, Sa,Su Warfarin 3mg : M,W,F.   Date INR Warfarin Dose/Comment  4/8 -- Last eliquis dose 4/8 @1800   4/9 1.6* **  4/10 -- 2 mg**  4/11 1.4 4 mg**  4/12 1.8 3 mg  4/13 2.8 hold  4/14 2.8 hold  * Baseline  INR elevated (1.6 on admit) likely ISO eliquis PTA (last dose 4/8 1800). **Pt received CTX (4/9-11) & azith (4/9-10).   Goal of Therapy:  INR: 2 - 3. (while inpatient; PTA was 2.5-3.5) Monitor platelets by anticoagulation protocol: Yes    Plan:   INR became rapidly therapeutic and remains elevated   Will hold warfarin x1 again tonight  Daily INR ordered.  Dallie Piles, PharmD Clinical Pharmacist 03/16/2021 7:03 AM

## 2021-03-16 NOTE — Progress Notes (Signed)
PROGRESS NOTE    [name] [facility]  HYW:[id number] DOB: 08-29-1951 DOA: 03/11/2021 PCP: Juline Patch, MD    Brief Narrative:  70 year old female with history of hypertension, hypothyroidism, history of DVT and antiphospholipid syndrome who was previously on Coumadin for 15 years and recently changed to Eliquis, chronic kidney disease stage IIIb with baseline creatinine 1.8, hyperlipidemia, depression, cognitive decline, recent history of stroke and rehab, recent right ankle fracture brought back from nursing home with altered mental status.  Recent hospitalization for stroke, rehab and discharged to the nursing home.  She has been remaining in poor health since then, appetite is poor.  Patient was complaining of midsternal chest pain for about 2 days.  Both spouse at long-term nursing home now. In the emergency room, patient is confused.  Hemodynamically stable.  Sodium 132.  Creatinine 2.03 mildly elevated from baseline otherwise most of the findings negative.  COVID-19 negative.  UA consistent with large leukocytes.  CT head with multiple previous infarctions but no acute findings. Patient was ultimately found to have cardioembolic stroke.  4/14-s/p bone biopsy   Assessment & Plan:   Active Problems:   Adult hypothyroidism   Essential hypertension   Anticoagulant long-term use   Antiphospholipid antibody syndrome (HCC)   Stage 3 chronic kidney disease (HCC)   Thrombocytopenia (HCC)   CVA (cerebral vascular accident) ([place])   Ischemic cerebrovascular accident (CVA) of frontal lobe (HCC)   Embolism of left anterior cerebral artery   AKI (acute kidney injury) ([place])   Pressure injury of skin   Encephalopathy   Normocytic anemia  Acute metabolic encephalopathy in a patient with multiple medical issues: Developing vascular dementia and cognition deficits. Currently hemodynamically stable.  Neurologically stable.  Found to have acute a stroke, see below. Minimize benzodiazepines and  opiates.  Patient has inconsistent complaints and easily reoriented. Delirium precautions.  Sleep-wake cycle regulation. 4/14-at baseline. Remains stable.  Suspected UTI: Patient with recurrent UTI.  Urinalysis abnormal.  Previous Citrobacter.  Procalcitonin very high with no other explanation of infection.  4/12-was given rocephin. ucx with 20,000 diptheroids only.  Rocephin was d/c'd  Leukocytosis has improved.  Afebrile.     Elevated troponin- Echo with EF of 50 to 55%.  Grade 2 diastolic dysfunction.  No regional wall motion abnormality.  12/14 cardiology following  Not a candidate for invasive cardiac evaluation due to multiple comorbidities including recent CVA, ARI Recommend conservative management      Acute kidney injury on chronic kidney disease stage IIIb: Patient was treated with IV fluids.  Renal functions improved.  Her baseline creatinine reported about 1.8.   4/14 remained stable  Continue to monitor  IV fluid was resumed yesterday since had decreased p.o. intake   Antiphospholipid antibody syndrome/hypercoagulable condition/recurrent cardioembolic stroke: Repeat MRI showed multiple cardioembolic showering embolism.  No new neurological deficit. Diagnosed antiphospholipid antibody syndrome, changed to Eliquis 3 months ago.  Will treat as failure of Eliquis as she was consistently taking this at nursing home. Patient on heparin, changed to Lovenox today.  Coumadin started 4/10, long-term management should be with Coumadin.  Patient is going to be in a supervised living condition now, will be able to keep up on Coumadin. Sinus rhythm and rate controlled. Neurology following, PT/OT/speech.  Referred to a skilled nursing rehab. 4/14 may need outpatient evaluation by cardiology for possible PAF  INR therapeutic pharmacy monitoring dosing       Right ankle fracture: Conservative management.  Nonweightbearing on cast.  Hypothyroidism: On Synthroid that  we will  continue.  TSH normal.  Anemia of chronic disease/normocytic anemia:   Iron and ferritin level were normal.  B12 level is adequate and on replacement.   Stool occult negative May need screening exam including EGD and colonoscopy, however with acute embolic stroke may postpone the procedure. 4/13-hematology consulted input was appreciated.-Checking anemia work-up including folate level, reticulocyte count, LDH, B1 and B6, copper, haptoglobin and multiple myeloma panel B12 level normal, folate normal, LDH elevated Hemoglobin stable at 7.9 this a.m. Doubt splenic hematoma causing the drop in hemoglobin 4/14-bone marrow biopsy done this a.m.  Subcapsular Splenic hematoma with unknown chronicity. Hg stable. Doubt cause of drop in Hg Plan to monitor , repeat US in few weeks. Monitor h/h No surgical intervention CT abd done please see result.  Adrenal nodule- f/u ct /MRI in 12 months is recommended to rule out neoplasm. Was found on CT scan.     DVT prophylaxis:  Coumadin.   Code Status: DNR Family Communication: Patient's daughter at the bedside Disposition Plan: Status is: Inpatient  Remains inpatient appropriate because:Altered mental status, IV treatments appropriate due to intensity of illness or inability to take PO and Inpatient level of care appropriate due to severity of illness   Dispo: The patient is from: SNF              Anticipated d/c is to: SNF              Patient currently is not medically stable to d/c.   Difficult to place patient No  Consultants:   Cardiology  Neurology  Hematology  GSX  Procedures:   None  Antimicrobials:   Rocephin 4/9---4/11   Subjective: No pain this AM.  No nausea or vomiting  Objective: Vitals:   03/15/21 2110 03/15/21 2113 03/15/21 2339 03/16/21 0426  BP:   134/79 137/78  Pulse: (!) 112 (!) 109 79 96  Resp:    16  Temp:   98.7 F (37.1 C) 98.6 F (37 C)  TempSrc:   Oral   SpO2:   99% 95%  Weight:      Height:         Intake/Output Summary (Last 24 hours) at 03/16/2021 0801 Last data filed at 03/16/2021 0400 Gross per 24 hour  Intake 794.54 ml  Output --  Net 794.54 ml   Filed Weights   03/11/21 0921  Weight: 83.5 kg    Examination: Calm, NAD CTA no wheeze rales rhonchi's Regular S1-S2 no gallops Soft benign positive bowel sounds No edema Awake and alert   Data Reviewed: I have personally reviewed following labs and imaging studies  CBC: Recent Labs  Lab 03/11/21 0926 03/12/21 0553 03/13/21 0402 03/14/21 0457 03/15/21 0443 03/16/21 0547  WBC 8.5 7.3 9.5 8.5 12.5* 8.0  NEUTROABS 6.3  --  7.3 5.7 9.7* 5.7  HGB 8.6* 7.6* 7.6* 7.3* 7.9* 7.7*  HCT 26.4* 24.0* 23.4* 22.0* 23.8* 24.1*  MCV 94.6 96.8 95.1 95.2 95.6 98.0  PLT 104* 80* 78* 94* 131* 716*   Basic Metabolic Panel: Recent Labs  Lab 03/11/21 0926 03/12/21 0553 03/13/21 0402 03/14/21 0457 03/15/21 0443  NA 132* 137 136 136 135  K 3.9 4.1 4.0 3.7 3.5  CL 101 103 104 104 101  CO2 _0 GLUCOSE 112* 88 109* 98 109*  BUN 35* 31* 26* 21 20  CREATININE 2.03* 1.80* 1.77* 1.84* 1.82*  CALCIUM 10.0 9.5 9.7 9.4 9.8  MG  --   --  1.8  --   --   PHOS  --   --  3.5  --   --    GFR: Estimated Creatinine Clearance: 33 mL/min (A) (by C-G formula based on SCr of 1.82 mg/dL (H)). Liver Function Tests: Recent Labs  Lab 03/11/21 0926  AST 46*  ALT 44  ALKPHOS 262*  BILITOT 0.9  PROT 7.2  ALBUMIN 2.7*   No results for input(s): LIPASE, AMYLASE in the last 168 hours. Recent Labs  Lab 03/11/21 1323  AMMONIA <9*   Coagulation Profile: Recent Labs  Lab 03/11/21 1507 03/13/21 0402 03/14/21 0457 03/15/21 0443 03/16/21 0547  INR 1.6* 1.4* 1.8* 2.8* 2.8*   Cardiac Enzymes: No results for input(s): CKTOTAL, CKMB, CKMBINDEX, TROPONINI in the last 168 hours. BNP (last 3 results) No results for input(s): PROBNP in the last 8760 hours. HbA1C: Recent Labs    03/14/21 0457  HGBA1C 5.1   CBG: Recent  Labs  Lab 03/12/21 1347  GLUCAP 89   Lipid Profile: Recent Labs    03/14/21 0457  CHOL 111  HDL 38*  LDLCALC 53  TRIG 99  CHOLHDL 2.9   Thyroid Function Tests: No results for input(s): TSH, T4TOTAL, FREET4, T3FREE, THYROIDAB in the last 72 hours. Anemia Panel: Recent Labs    03/15/21 0443  FOLATE 11.9  RETICCTPCT 4.5*   Sepsis Labs: Recent Labs  Lab 03/12/21 0553 03/13/21 0402 03/15/21 0443 03/16/21 0547  PROCALCITON 46.36 26.80 6.74 4.00    Recent Results (from the past 240 hour(s))  Urine Culture     Status: Abnormal   Collection Time: 03/11/21  9:26 AM   Specimen: Urine, Random  Result Value Ref Range Status   Specimen Description   Final    URINE, RANDOM Performed at [facility], [address], [place], [place] [address]    Special Requests   Final    NONE Performed at [facility], [address] [place], [place] [address]    Culture (A)  Final    20,000 COLONIES/mL DIPHTHEROIDS(CORYNEBACTERIUM SPECIES) Standardized susceptibility testing for this organism is not available. Performed at [facility], [place] [address] [place], [place] [address]    Report Status 03/12/2021 FINAL  Final  SARS CORONAVIRUS 2 (TAT 6-24 HRS) Nasopharyngeal Nasopharyngeal Swab     Status: None   Collection Time: 03/11/21 11:05 AM   Specimen: Nasopharyngeal Swab  Result Value Ref Range Status   SARS Coronavirus 2 NEGATIVE NEGATIVE Final    Comment: (NOTE) SARS-CoV-2 target nucleic acids are NOT DETECTED.  The SARS-CoV-2 RNA is generally detectable in upper and lower respiratory specimens during the acute phase of infection. Negative results do not preclude SARS-CoV-2 infection, do not rule out co-infections with other pathogens, and should not be used as the sole basis for treatment or other patient management decisions. Negative results must be combined with clinical observations, patient history, and epidemiological information. The  expected result is Negative.  Fact Sheet for Patients: SugarRoll.be  Fact Sheet for Healthcare Providers: https://www.woods-mathews.com/  This test is not yet approved or cleared by the [place] FDA and  has been authorized for detection and/or diagnosis of SARS-CoV-2 by FDA under an Emergency Use Authorization (EUA). This EUA will remain  in effect (meaning this test can be used) for the duration of the COVID-19 declaration under Se ction 564(b)(1) of the Act, 21 U.S.C. section 360bbb-3(b)(1), unless the authorization is terminated or revoked sooner.  Performed at [facility], [place]  [address] [place], [place] [address]          Radiology Studies: CT ABDOMEN PELVIS WO CONTRAST  Result Date: 03/14/2021 CLINICAL DATA:  Left upper quadrant abdominal pain. EXAM: CT ABDOMEN AND PELVIS WITHOUT CONTRAST TECHNIQUE: Multidetector CT imaging of the abdomen and pelvis was performed following the standard protocol without IV contrast. COMPARISON:  Ultrasound of same day. CT of March 11, 2021 and January 09, 2021. FINDINGS: Lower chest: Small left pleural effusion is noted with minimal adjacent subsegmental atelectasis. Hepatobiliary: No focal liver abnormality is seen. Status post cholecystectomy. No biliary dilatation. Pancreas: Unremarkable. No pancreatic ductal dilatation or surrounding inflammatory changes. Spleen: Stable size and appearance of oval-shaped low density involving inferior and anterior portion of the spleen most consistent with subcapsular hematoma. Infarction is felt to be less likely. Adrenals/Urinary Tract: 2 cm right adrenal nodule is noted which is unchanged compared to prior exams. Left adrenal gland is unremarkable. Bilateral renal cortical thinning and scarring is noted. No hydronephrosis or renal obstruction is noted. No renal or ureteral calculi are noted. Urinary bladder is unremarkable. Stomach/Bowel: The stomach and  appendix appear normal. There is no evidence of bowel obstruction. No inflammation is noted. Large amount of stool is noted in the distal sigmoid colon and rectum concerning for impaction. Vascular/Lymphatic: Aortic atherosclerosis. No enlarged abdominal or pelvic lymph nodes. Reproductive: Uterus and bilateral adnexa are unremarkable. Other: No abdominal wall hernia or abnormality. No abdominopelvic ascites. Musculoskeletal: No acute or significant osseous findings. IMPRESSION: Grossly stable size and appearance of oval-shaped low density involving inferior anterior portion of the spleen most consistent with subcapsular hematoma. Infarction is felt to be less likely. As recommended by ultrasound exam today, follow-up ultrasound in several weeks is recommended to ensure stability or resolution of this abnormality. These results were called by telephone at the time of interpretation on 03/14/2021 at 6:17 pm to provider [facility] , who verbally acknowledged these results. 2 cm right adrenal nodule is noted. Follow-up CT or MRI in 12 months is recommended to rule out neoplasm. Large amount of stool is noted in the distal sigmoid colon and rectum concerning for impaction. Aortic Atherosclerosis (ICD10-I70.0). Electronically Signed   By: Marijo Conception M.D.   On: 03/14/2021 18:17   ECHOCARDIOGRAM COMPLETE  Result Date: 03/14/2021    ECHOCARDIOGRAM REPORT   Patient Name:   [name] Date of Exam: 03/14/2021 Medical Rec #:  [id number]          Height:       68.0 in Accession #:    [id number]         Weight:       184.0 lb Date of Birth:  01-13-1951         BSA:          1.973 m Patient Age:    32 years           BP:           147/76 mmHg Patient Gender: F                  HR:           101 bpm. Exam Location:  [facility] Procedure: 2D Echo, Cardiac Doppler and Color Doppler Indications:     Stroke I63.9  History:         Patient has prior history of Echocardiogram examinations, most                  recent  12/27/2020. Stroke; Risk Factors:Hypertension.  Sonographer:     Sherrie Sport RDCS (AE) Referring Phys:  [id number] Barb Merino Diagnosing Phys: Isaias Cowman MD IMPRESSIONS  1. Left ventricular ejection fraction, by estimation, is 50 to 55%. The left ventricle has low normal function. The left ventricle has no regional wall motion abnormalities. Left ventricular diastolic parameters are consistent with Grade II diastolic dysfunction (pseudonormalization).  2. Right ventricular systolic function is normal. The right ventricular size is normal.  3. The mitral valve is normal in structure. Mild to moderate mitral valve regurgitation. No evidence of mitral stenosis.  4. The aortic valve is normal in structure. Aortic valve regurgitation is mild. No aortic stenosis is present.  5. The inferior vena cava is normal in size with greater than 50% respiratory variability, suggesting right atrial pressure of 3 mmHg. FINDINGS  Left Ventricle: Left ventricular ejection fraction, by estimation, is 50 to 55%. The left ventricle has low normal function. The left ventricle has no regional wall motion abnormalities. The left ventricular internal cavity size was normal in size. There is no left ventricular hypertrophy. Left ventricular diastolic parameters are consistent with Grade II diastolic dysfunction (pseudonormalization). Right Ventricle: The right ventricular size is normal. No increase in right ventricular wall thickness. Right ventricular systolic function is normal. Left Atrium: Left atrial size was normal in size. Right Atrium: Right atrial size was normal in size. Pericardium: There is no evidence of pericardial effusion. Mitral Valve: The mitral valve is normal in structure. Mild to moderate mitral valve regurgitation. No evidence of mitral valve stenosis. Tricuspid Valve: The tricuspid valve is normal in structure. Tricuspid valve regurgitation is mild . No evidence of tricuspid stenosis. Aortic Valve: The aortic  valve is normal in structure. Aortic valve regurgitation is mild. No aortic stenosis is present. Aortic valve mean gradient measures 6.0 mmHg. Aortic valve peak gradient measures 10.0 mmHg. Aortic valve area, by VTI measures 1.78  cm. Pulmonic Valve: The pulmonic valve was normal in structure. Pulmonic valve regurgitation is not visualized. No evidence of pulmonic stenosis. Aorta: The aortic root is normal in size and structure. Venous: The inferior vena cava is normal in size with greater than 50% respiratory variability, suggesting right atrial pressure of 3 mmHg. IAS/Shunts: No atrial level shunt detected by color flow Doppler.  LEFT VENTRICLE PLAX 2D LVIDd:         4.40 cm  Diastology LVIDs:         3.26 cm  LV e' medial:    6.64 cm/s LV PW:         0.98 cm  LV E/e' medial:  10.3 LV IVS:        0.93 cm  LV e' lateral:   5.00 cm/s LVOT diam:     2.00 cm  LV E/e' lateral: 13.7 LV SV:         60 LV SV Index:   30 LVOT Area:     3.14 cm  RIGHT VENTRICLE RV Basal diam:  2.97 cm RV S prime:     9.57 cm/s TAPSE (M-mode): 3.1 cm LEFT ATRIUM             Index       RIGHT ATRIUM           Index LA diam:        4.00 cm 2.03 cm/m  RA Area:     13.90 cm LA Vol (A2C):   60.5 ml 30.67 ml/m RA Volume:   31.30 ml  15.87 ml/m LA Vol (A4C):   62.6 ml 31.73 ml/m LA Biplane Vol: 66.5 ml 33.71 ml/m  AORTIC VALVE                    PULMONIC VALVE AV Area (Vmax):    1.41 cm     PV Vmax:        0.75 m/s AV Area (Vmean):   1.29 cm     PV Peak grad:   2.2 mmHg AV Area (VTI):     1.78 cm     RVOT Peak grad: 2 mmHg AV Vmax:           158.33 cm/s AV Vmean:          116.000 cm/s AV VTI:            0.337 m AV Peak Grad:      10.0 mmHg AV Mean Grad:      6.0 mmHg LVOT Vmax:         71.10 cm/s LVOT Vmean:        47.600 cm/s LVOT VTI:          0.191 m LVOT/AV VTI ratio: 0.57  AORTA Ao Root diam: 2.83 cm MITRAL VALVE                TRICUSPID VALVE MV Area (PHT): 3.60 cm     TR Peak grad:   33.2 mmHg MV Decel Time: 211 msec     TR Vmax:         288.00 cm/s MV E velocity: 68.60 cm/s MV A velocity: 117.00 cm/s  SHUNTS MV E/A ratio:  0.59         Systemic VTI:  0.19 m                             Systemic Diam: 2.00 cm Isaias Cowman MD Electronically signed by Isaias Cowman MD Signature Date/Time: 03/14/2021/1:55:06 PM    Final    US ABDOMINAL PELVIC ART/VENT FLOW DOPPLER LIMITED  Result Date: 03/14/2021 CLINICAL DATA:  Evaluate splenic abnormality on recent CT. EXAM: ULTRASOUND ABDOMEN LIMITED WITH DOPPLER TECHNIQUE: Images were obtained at the area of concern at the spleen. COMPARISON:  CT of the abdomen and pelvis 03/11/2021 FINDINGS: Spleen measures 11.8 x 5.3 x 12.7 cm. Calculated splenic volume is 397 mL. Superior and mid portions of the spleen have normal echotexture. Along the inferior aspect of the spleen there is fluid surrounding a solid structure which has similar echogenicity to the spleen. Suspect this is fluid collection around a small area of abnormal splenic tissue or fluid surrounding a splenic hematoma. These findings correspond with the recent CT findings. This area of concern with surrounding fluid measures 4.1 x 4.0 x 4.5 cm. Arterial and venous flow at the splenic hilum. No significant internal vascularity within the tissue surrounded by fluid. IMPRESSION: Complex fluid collection along the inferior aspect of spleen corresponding with the abnormality on the recent CT. The echogenic material surrounded by fluid could represent abnormal splenic tissue versus hematoma. The fluid collection and CT findings are more suggestive for a hematoma due to a splenic laceration rather than an infarct or abscess. Consider a follow-up ultrasound in 6-8 weeks to see if the fluid collection resolves. Electronically Signed   By: Markus Daft M.D.   On: 03/14/2021 13:23   US SPLEEN (ABDOMEN LIMITED)  Result Date: 03/14/2021 CLINICAL DATA:  Evaluate splenic abnormality on recent  CT. EXAM: ULTRASOUND ABDOMEN LIMITED WITH DOPPLER  TECHNIQUE: Images were obtained at the area of concern at the spleen. COMPARISON:  CT of the abdomen and pelvis 03/11/2021 FINDINGS: Spleen measures 11.8 x 5.3 x 12.7 cm. Calculated splenic volume is 397 mL. Superior and mid portions of the spleen have normal echotexture. Along the inferior aspect of the spleen there is fluid surrounding a solid structure which has similar echogenicity to the spleen. Suspect this is fluid collection around a small area of abnormal splenic tissue or fluid surrounding a splenic hematoma. These findings correspond with the recent CT findings. This area of concern with surrounding fluid measures 4.1 x 4.0 x 4.5 cm. Arterial and venous flow at the splenic hilum. No significant internal vascularity within the tissue surrounded by fluid. IMPRESSION: Complex fluid collection along the inferior aspect of spleen corresponding with the abnormality on the recent CT. The echogenic material surrounded by fluid could represent abnormal splenic tissue versus hematoma. The fluid collection and CT findings are more suggestive for a hematoma due to a splenic laceration rather than an infarct or abscess. Consider a follow-up ultrasound in 6-8 weeks to see if the fluid collection resolves. Electronically Signed   By: Markus Daft M.D.   On: 03/14/2021 13:23        Scheduled Meds: . atorvastatin  20 mg Oral QHS  . buPROPion  150 mg Oral Daily  . escitalopram  20 mg Oral Daily  . famotidine  20 mg Oral Daily  . feeding supplement  237 mL Oral TID BM  . ferrous sulfate  325 mg Oral QHS  . levothyroxine  100 mcg Oral Q0600  . lidocaine  1 patch Transdermal Q24H  . mouth rinse  15 mL Mouth Rinse BID  . melatonin  5 mg Oral QHS  . montelukast  10 mg Oral QHS  . multivitamin with minerals   Oral Daily  . oxybutynin  15 mg Oral QHS  . polyethylene glycol  17 g Oral BID  . senna-docusate  2 tablet Oral BID  . vitamin B-12  1,000 mcg Oral Daily  . Warfarin - Pharmacist Dosing Inpatient    Does not apply q1600   Continuous Infusions: . sodium chloride 50 mL/hr at 03/16/21 0521     LOS: 4 days    Time spent: 35 minutes with > 50% on coc   Nolberto Hanlon, MD [facility] Pager [phone]

## 2021-03-16 NOTE — Procedures (Signed)
Interventional Radiology Procedure Note  Procedure: Bone marrow biopsy  Indication: Anemia and Thrombocytopenia  Findings: Please refer to procedural dictation for full description.  Complications: None  EBL: < 10 mL  Miachel Roux, MD [phone]

## 2021-03-16 NOTE — TOC Progression Note (Signed)
Transition of Care [facility]) - Progression Note    Patient Details  Name: [name] MRN: [id number] Date of Birth: 09-23-1951  [facility] of [facility] CM/SW Contact  Shelbie Hutching, RN Phone Number: 03/16/2021, 2:06 PM  Clinical Narrative:    MD anticipating that patient will be ready for discharge tomorrow.  Patient will be going to [facility] and Rehab in [place].  Her husband will also be going over to [place] from [facility] for Long term care.  They will be able to share a room while she is over there getting therapy.  Insurance authorization started in [place] portable.  Sharyn Lull over at [place] aware of plan for DC tomorrow.    Expected Discharge Plan: [place] Barriers to Discharge: Continued Medical Work up  Expected Discharge Plan and Services Expected Discharge Plan: [place] arrangements for the past 2 months: [place]                                       Social Determinants of Health (SDOH) Interventions    Readmission Risk Interventions No flowsheet data found.

## 2021-03-16 NOTE — Progress Notes (Signed)
[facility] SURGICAL PROGRESS NOTE (cpt [phone])  Hospital Day(s): 4. .   Interval History: Patient seen and examined, no acute events or new complaints overnight. Patient reports she has no abdominal pain this morning, denies fever, chills, nausea, emesis. Transient leukocytosis now resolved, WBC normalized to 8.0. Hgb remains stable at 7.7. She is being worked up by hematology for her anemia. She had ben tolerated diet in previous days but is NPO for possible bone marrow biopsy.    Review of Systems:  Limited secondary to mental acuity Constitutional: denies fever, chills  Gastrointestinal: denies abdominal pain, N/V  Vital signs in last 24 hours: [min-max] current  Temp:  [98.4 F (36.9 C)-98.7 F (37.1 C)] 98.6 F (37 C) (04/14 0426) Pulse Rate:  [79-118] 96 (04/14 0426) Resp:  [16-18] 16 (04/14 0426) BP: (129-151)/(78-92) 137/78 (04/14 0426) SpO2:  [95 %-99 %] 95 % (04/14 0426)     Height: '5\' 8"'  (172.7 cm) Weight: 83.5 kg BMI (Calculated): 27.98   Intake/Output last 2 shifts:  04/13 0701 - 04/14 0700 In: 794.5 [P.O.:120; I.V.:674.5] Out: -    Physical Exam:  Constitutional: alert, cooperative and no distress  HENT: normocephalic without obvious abnormality  Eyes: PERRL, EOM's grossly intact and symmetric  Respiratory: breathing non-labored at rest  Cardiovascular: regular rate and sinus rhythm  Gastrointestinal: Soft, non-tender, non-distended, no rebound/guarding Musculoskeletal: no edema or wounds, motor and sensation grossly intact, NT     Labs:  CBC Latest Ref Rng & Units 03/16/2021 03/15/2021 03/14/2021  WBC 4.0 - 10.5 K/uL 8.0 12.5(H) 8.5  Hemoglobin 12.0 - 15.0 g/dL 7.7(L) 7.9(L) 7.3(L)  Hematocrit 36.0 - 46.0 % 24.1(L) 23.8(L) 22.0(L)  Platelets 150 - 400 K/uL 143(L) 131(L) 94(L)   CMP Latest Ref Rng & Units 03/15/2021 03/14/2021 03/13/2021  Glucose 70 - 99 mg/dL 109(H) 98 109(H)  BUN 8 - 23 mg/dL 20 21 26(H)  Creatinine 0.44 - 1.00 mg/dL 1.82(H)  1.84(H) 1.77(H)  Sodium 135 - 145 mmol/L 135 136 136  Potassium 3.5 - 5.1 mmol/L 3.5 3.7 4.0  Chloride 98 - 111 mmol/L 101 104 104  CO2 22 - 32 mmol/L '24 25 24  ' Calcium 8.9 - 10.3 mg/dL 9.8 9.4 9.7  Total Protein 6.5 - 8.1 g/dL - - -  Total Bilirubin 0.3 - 1.2 mg/dL - - -  Alkaline Phos 38 - 126 U/L - - -  AST 15 - 41 U/L - - -  ALT 0 - 44 U/L - - -     Imaging studies: No new pertinent imaging studies   Assessment/Plan: (ICD-10's: S70.029A) 70 y.o. female with stable/improved Hgb found to have possible splenic hematomawith unknown chronicity, complicated by pertinent comorbidities includingCVA, DVT, antiphospholipid antibody syndrome in need for chronic anticoagulation on Coumadin, as well as CKD, HLD, and debilitation/cognitive impairment              - Appreciate vascular surgery assistance             - No surgical interventions; will follow closely             - Monitor leukocytosis; trend             - Monitor H&H; transfuse as needed; hematology following  - Monitor abdominal examination - Pain control prn; antiemetics prn - Further management per primary service   - Her Hgb has remained stable now x72 hours and she continues to remain pain free. Nothing further to add from surgery standpoint, we will sign  off. Please call with questions/concerns.   All of the above findings and recommendations were discussed with the patient, and the medical team.  -- Edison Simon, PA-C [facility] 03/16/2021, 7:10 AM [phone] M-F: 7am - 4pm

## 2021-03-16 NOTE — Progress Notes (Signed)
Patient clinically stable post BMB per Dr Dwaine Gale, tolerated well. Versed 2 mg along with Fentanyl 100 mcg IV for procedure. Report given to Brandi RN at bedside with daughter present. Denies complaints at this time.

## 2021-03-17 DIAGNOSIS — F0151 Vascular dementia with behavioral disturbance: Secondary | ICD-10-CM | POA: Diagnosis not present

## 2021-03-17 DIAGNOSIS — R269 Unspecified abnormalities of gait and mobility: Secondary | ICD-10-CM | POA: Diagnosis not present

## 2021-03-17 DIAGNOSIS — N390 Urinary tract infection, site not specified: Secondary | ICD-10-CM | POA: Diagnosis not present

## 2021-03-17 DIAGNOSIS — G3184 Mild cognitive impairment, so stated: Secondary | ICD-10-CM | POA: Diagnosis not present

## 2021-03-17 DIAGNOSIS — Z1152 Encounter for screening for COVID-19: Secondary | ICD-10-CM | POA: Diagnosis not present

## 2021-03-17 DIAGNOSIS — E039 Hypothyroidism, unspecified: Secondary | ICD-10-CM | POA: Diagnosis not present

## 2021-03-17 DIAGNOSIS — G479 Sleep disorder, unspecified: Secondary | ICD-10-CM | POA: Diagnosis not present

## 2021-03-17 DIAGNOSIS — I639 Cerebral infarction, unspecified: Secondary | ICD-10-CM | POA: Diagnosis not present

## 2021-03-17 DIAGNOSIS — I6350 Cerebral infarction due to unspecified occlusion or stenosis of unspecified cerebral artery: Secondary | ICD-10-CM | POA: Diagnosis not present

## 2021-03-17 DIAGNOSIS — W19XXXA Unspecified fall, initial encounter: Secondary | ICD-10-CM | POA: Diagnosis not present

## 2021-03-17 DIAGNOSIS — M6281 Muscle weakness (generalized): Secondary | ICD-10-CM | POA: Diagnosis not present

## 2021-03-17 DIAGNOSIS — R278 Other lack of coordination: Secondary | ICD-10-CM | POA: Diagnosis not present

## 2021-03-17 DIAGNOSIS — D6832 Hemorrhagic disorder due to extrinsic circulating anticoagulants: Secondary | ICD-10-CM | POA: Diagnosis not present

## 2021-03-17 DIAGNOSIS — D68312 Antiphospholipid antibody with hemorrhagic disorder: Secondary | ICD-10-CM | POA: Diagnosis not present

## 2021-03-17 DIAGNOSIS — R2681 Unsteadiness on feet: Secondary | ICD-10-CM | POA: Diagnosis not present

## 2021-03-17 DIAGNOSIS — E860 Dehydration: Secondary | ICD-10-CM | POA: Diagnosis not present

## 2021-03-17 DIAGNOSIS — E782 Mixed hyperlipidemia: Secondary | ICD-10-CM | POA: Diagnosis not present

## 2021-03-17 DIAGNOSIS — S82851D Displaced trimalleolar fracture of right lower leg, subsequent encounter for closed fracture with routine healing: Secondary | ICD-10-CM | POA: Diagnosis not present

## 2021-03-17 DIAGNOSIS — Z006 Encounter for examination for normal comparison and control in clinical research program: Secondary | ICD-10-CM | POA: Diagnosis not present

## 2021-03-17 DIAGNOSIS — R410 Disorientation, unspecified: Secondary | ICD-10-CM | POA: Diagnosis not present

## 2021-03-17 DIAGNOSIS — Z719 Counseling, unspecified: Secondary | ICD-10-CM | POA: Diagnosis not present

## 2021-03-17 DIAGNOSIS — R413 Other amnesia: Secondary | ICD-10-CM | POA: Diagnosis not present

## 2021-03-17 DIAGNOSIS — N183 Chronic kidney disease, stage 3 unspecified: Secondary | ICD-10-CM | POA: Diagnosis not present

## 2021-03-17 DIAGNOSIS — D509 Iron deficiency anemia, unspecified: Secondary | ICD-10-CM | POA: Diagnosis not present

## 2021-03-17 DIAGNOSIS — G459 Transient cerebral ischemic attack, unspecified: Secondary | ICD-10-CM | POA: Diagnosis not present

## 2021-03-17 DIAGNOSIS — R41841 Cognitive communication deficit: Secondary | ICD-10-CM | POA: Diagnosis not present

## 2021-03-17 DIAGNOSIS — I69228 Other speech and language deficits following other nontraumatic intracranial hemorrhage: Secondary | ICD-10-CM | POA: Diagnosis not present

## 2021-03-17 DIAGNOSIS — R531 Weakness: Secondary | ICD-10-CM | POA: Diagnosis not present

## 2021-03-17 DIAGNOSIS — B379 Candidiasis, unspecified: Secondary | ICD-10-CM | POA: Diagnosis not present

## 2021-03-17 DIAGNOSIS — L89511 Pressure ulcer of right ankle, stage 1: Secondary | ICD-10-CM | POA: Diagnosis not present

## 2021-03-17 DIAGNOSIS — S82851A Displaced trimalleolar fracture of right lower leg, initial encounter for closed fracture: Secondary | ICD-10-CM | POA: Diagnosis not present

## 2021-03-17 DIAGNOSIS — R5381 Other malaise: Secondary | ICD-10-CM | POA: Diagnosis not present

## 2021-03-17 DIAGNOSIS — F05 Delirium due to known physiological condition: Secondary | ICD-10-CM | POA: Diagnosis not present

## 2021-03-17 DIAGNOSIS — I6349 Cerebral infarction due to embolism of other cerebral artery: Secondary | ICD-10-CM | POA: Diagnosis not present

## 2021-03-17 DIAGNOSIS — N179 Acute kidney failure, unspecified: Secondary | ICD-10-CM | POA: Diagnosis not present

## 2021-03-17 DIAGNOSIS — F419 Anxiety disorder, unspecified: Secondary | ICD-10-CM | POA: Diagnosis not present

## 2021-03-17 DIAGNOSIS — I6612 Occlusion and stenosis of left anterior cerebral artery: Secondary | ICD-10-CM | POA: Diagnosis not present

## 2021-03-17 DIAGNOSIS — N189 Chronic kidney disease, unspecified: Secondary | ICD-10-CM | POA: Diagnosis not present

## 2021-03-17 DIAGNOSIS — I10 Essential (primary) hypertension: Secondary | ICD-10-CM | POA: Diagnosis not present

## 2021-03-17 DIAGNOSIS — F411 Generalized anxiety disorder: Secondary | ICD-10-CM | POA: Diagnosis not present

## 2021-03-17 DIAGNOSIS — G9340 Encephalopathy, unspecified: Secondary | ICD-10-CM | POA: Diagnosis not present

## 2021-03-17 DIAGNOSIS — I63422 Cerebral infarction due to embolism of left anterior cerebral artery: Secondary | ICD-10-CM | POA: Diagnosis not present

## 2021-03-17 DIAGNOSIS — G9341 Metabolic encephalopathy: Secondary | ICD-10-CM | POA: Diagnosis not present

## 2021-03-17 DIAGNOSIS — F330 Major depressive disorder, recurrent, mild: Secondary | ICD-10-CM | POA: Diagnosis not present

## 2021-03-17 DIAGNOSIS — Z20822 Contact with and (suspected) exposure to covid-19: Secondary | ICD-10-CM | POA: Diagnosis not present

## 2021-03-17 DIAGNOSIS — Z7901 Long term (current) use of anticoagulants: Secondary | ICD-10-CM | POA: Diagnosis not present

## 2021-03-17 DIAGNOSIS — D735 Infarction of spleen: Secondary | ICD-10-CM | POA: Diagnosis not present

## 2021-03-17 DIAGNOSIS — M84471D Pathological fracture, right ankle, subsequent encounter for fracture with routine healing: Secondary | ICD-10-CM | POA: Diagnosis not present

## 2021-03-17 DIAGNOSIS — D649 Anemia, unspecified: Secondary | ICD-10-CM | POA: Diagnosis not present

## 2021-03-17 DIAGNOSIS — R1312 Dysphagia, oropharyngeal phase: Secondary | ICD-10-CM | POA: Diagnosis not present

## 2021-03-17 DIAGNOSIS — F331 Major depressive disorder, recurrent, moderate: Secondary | ICD-10-CM | POA: Diagnosis not present

## 2021-03-17 DIAGNOSIS — R7402 Elevation of levels of lactic acid dehydrogenase (LDH): Secondary | ICD-10-CM | POA: Diagnosis not present

## 2021-03-17 DIAGNOSIS — R296 Repeated falls: Secondary | ICD-10-CM | POA: Diagnosis not present

## 2021-03-17 DIAGNOSIS — R4182 Altered mental status, unspecified: Secondary | ICD-10-CM | POA: Diagnosis not present

## 2021-03-17 DIAGNOSIS — M80061A Age-related osteoporosis with current pathological fracture, right lower leg, initial encounter for fracture: Secondary | ICD-10-CM | POA: Diagnosis not present

## 2021-03-17 DIAGNOSIS — Z8673 Personal history of transient ischemic attack (TIA), and cerebral infarction without residual deficits: Secondary | ICD-10-CM | POA: Diagnosis not present

## 2021-03-17 DIAGNOSIS — I7409 Other arterial embolism and thrombosis of abdominal aorta: Secondary | ICD-10-CM | POA: Diagnosis not present

## 2021-03-17 DIAGNOSIS — F5105 Insomnia due to other mental disorder: Secondary | ICD-10-CM | POA: Diagnosis not present

## 2021-03-17 DIAGNOSIS — R161 Splenomegaly, not elsewhere classified: Secondary | ICD-10-CM | POA: Diagnosis not present

## 2021-03-17 DIAGNOSIS — R279 Unspecified lack of coordination: Secondary | ICD-10-CM | POA: Diagnosis not present

## 2021-03-17 DIAGNOSIS — B342 Coronavirus infection, unspecified: Secondary | ICD-10-CM | POA: Diagnosis not present

## 2021-03-17 LAB — CBC WITH DIFFERENTIAL/PLATELET
Abs Immature Granulocytes: 0.13 10*3/uL — ABNORMAL HIGH (ref 0.00–0.07)
Basophils Absolute: 0.1 10*3/uL (ref 0.0–0.1)
Basophils Relative: 1 %
Eosinophils Absolute: 0.2 10*3/uL (ref 0.0–0.5)
Eosinophils Relative: 3 %
HCT: 24.9 % — ABNORMAL LOW (ref 36.0–46.0)
Hemoglobin: 7.9 g/dL — ABNORMAL LOW (ref 12.0–15.0)
Immature Granulocytes: 2 %
Lymphocytes Relative: 19 %
Lymphs Abs: 1.6 10*3/uL (ref 0.7–4.0)
MCH: 31.3 pg (ref 26.0–34.0)
MCHC: 31.7 g/dL (ref 30.0–36.0)
MCV: 98.8 fL (ref 80.0–100.0)
Monocytes Absolute: 0.8 10*3/uL (ref 0.1–1.0)
Monocytes Relative: 10 %
Neutro Abs: 5.4 10*3/uL (ref 1.7–7.7)
Neutrophils Relative %: 65 %
Platelets: 186 10*3/uL (ref 150–400)
RBC: 2.52 MIL/uL — ABNORMAL LOW (ref 3.87–5.11)
RDW: 15 % (ref 11.5–15.5)
WBC: 8.3 10*3/uL (ref 4.0–10.5)
nRBC: 0 % (ref 0.0–0.2)

## 2021-03-17 LAB — PROTIME-INR
INR: 2.2 — ABNORMAL HIGH (ref 0.8–1.2)
Prothrombin Time: 24.7 seconds — ABNORMAL HIGH (ref 11.4–15.2)

## 2021-03-17 LAB — COPPER, SERUM: Copper: 145 ug/dL (ref 80–158)

## 2021-03-17 LAB — SARS CORONAVIRUS 2 (TAT 6-24 HRS): SARS Coronavirus 2: NEGATIVE

## 2021-03-17 LAB — METHYLMALONIC ACID, SERUM: Methylmalonic Acid, Quantitative: 310 nmol/L (ref 0–378)

## 2021-03-17 LAB — PROCALCITONIN: Procalcitonin: 2.08 ng/mL

## 2021-03-17 MED ORDER — FLEET ENEMA 7-19 GM/118ML RE ENEM: 1.0000 | ENEMA | Freq: Every day | RECTAL | 0 refills | Status: AC | PRN

## 2021-03-17 MED ORDER — SENNOSIDES-DOCUSATE SODIUM 8.6-50 MG PO TABS: 2.0000 | ORAL_TABLET | Freq: Two times a day (BID) | ORAL | Status: AC

## 2021-03-17 MED ORDER — OXYCODONE HCL 5 MG PO TABS: 5.0000 mg | ORAL_TABLET | Freq: Four times a day (QID) | ORAL | 0 refills | Status: AC | PRN

## 2021-03-17 MED ORDER — POLYETHYLENE GLYCOL 3350 17 G PO PACK: 17.0000 g | PACK | Freq: Two times a day (BID) | ORAL | 0 refills | Status: AC

## 2021-03-17 MED ORDER — ADULT MULTIVITAMIN W/MINERALS CH: 1.0000 | ORAL_TABLET | Freq: Every day | ORAL | Status: AC

## 2021-03-17 MED ORDER — ENSURE ENLIVE PO LIQD: 237.0000 mL | Freq: Three times a day (TID) | ORAL | 12 refills | Status: AC

## 2021-03-17 MED ORDER — WARFARIN SODIUM 3 MG PO TABS: 3.0000 mg | ORAL_TABLET | Freq: Once | ORAL | Status: AC

## 2021-03-17 MED ORDER — LIDOCAINE 5 % EX PTCH: 1.0000 | MEDICATED_PATCH | CUTANEOUS | 0 refills | Status: AC

## 2021-03-17 MED ORDER — WARFARIN SODIUM 3 MG PO TABS
3.0000 mg | ORAL_TABLET | Freq: Once | ORAL | Status: DC
  Filled 2021-03-17: qty 1

## 2021-03-17 NOTE — Plan of Care (Signed)
End of Shift Summary:  Alert and oriented x4. VSS. Remained on room air, sats >95%. Pain managed with prn medications. Denies n/v. IVF maintained. Remained free from falls or injury. Call bell within reach and able to use.   Problem: Clinical Measurements: Goal: Ability to maintain clinical measurements within normal limits will improve Outcome: Progressing Goal: Will remain free from infection Outcome: Progressing Goal: Diagnostic test results will improve Outcome: Progressing Goal: Respiratory complications will improve Outcome: Progressing Goal: Cardiovascular complication will be avoided Outcome: Progressing   Problem: Health Behavior/Discharge Planning: Goal: Ability to manage health-related needs will improve Outcome: Progressing   Problem: Pain Managment: Goal: General experience of comfort will improve Outcome: Progressing

## 2021-03-17 NOTE — TOC Transition Note (Signed)
Transition of Care [facility]) - CM/SW Discharge Note   Patient Details  Name: [name] MRN: [id number] Date of Birth: 18-May-1951  [facility]) CM/SW Contact:  Shelbie Hutching, RN Phone Number: 03/17/2021, 1:30 PM   Clinical Narrative:    Discharge summary sent to [facility].  Bedside RN will call report to [phone] .  Transport has been arranged for 1530 with [facility].     Final next level of care: [place] Barriers to Discharge: Barriers Resolved   Patient Goals and CMS Choice Patient states their goals for this hospitalization and ongoing recovery are:: SNF rehab and transition to LTC CMS Medicare.gov Compare Post Acute Care list provided to:: Patient Represenative (must comment) Choice offered to / list presented to : Adult Children  Discharge Placement              Patient chooses bed at: [facility] Patient to be transferred to facility by: [facility] Name of family member notified: Hillary Patient and family notified of of transfer: 03/17/21  Discharge Plan and Services                DME Arranged: N/A DME Agency: NA       HH Arranged: NA          Social Determinants of Health (SDOH) Interventions     Readmission Risk Interventions No flowsheet data found.

## 2021-03-17 NOTE — Consult Note (Signed)
[place] for lovenox + warfarin  Indication: chest pain/ACS, embolic stroke, and antiphospholipid antibody   Patient Measurements: Height: 5\' 8"  (172.7 cm) Weight: 83.5 kg (184 lb) IBW/kg (Calculated) : 63.9 Heparin Dosing Weight: 81 kg  Vital Signs: Temp: 98.4 F (36.9 C) (04/15 1134) Temp Source: Oral (04/15 0757) BP: 124/76 (04/15 1134) Pulse Rate: 101 (04/15 1134)  Labs: Recent Labs    03/15/21 0443 03/16/21 0547 03/17/21 0513  HGB 7.9* 7.7* 7.9*  HCT 23.8* 24.1* 24.9*  PLT 131* 143* 186  LABPROT 29.8* 29.1* 24.7*  INR 2.8* 2.8* 2.2*  CREATININE 1.82*  --   --     Estimated Creatinine Clearance: 33 mL/min (A) (by C-G formula based on SCr of 1.82 mg/dL (H)).   Medical History: Past Medical History:  Diagnosis Date  . Arthritis 04/02/1996  . Depression   . GERD (gastroesophageal reflux disease) 04/03/2019  . Hyperlipidemia   . Hypertension   . Renal insufficiency   . Stroke ([place])   . Thyroid disease     Medications:  Medications Prior to Admission  Medication Sig Dispense Refill Last Dose  . acetaminophen (TYLENOL) 325 MG tablet Take 2 tablets (650 mg total) by mouth every 4 (four) hours as needed for mild pain (or temp > 37.5 C (99.5 F)).   Unknown at PRN  . apixaban (ELIQUIS) 5 MG TABS tablet Take 1 tablet (5 mg total) by mouth 2 (two) times daily. 60 tablet  03/10/2021 at 1800  . ascorbic acid (VITAMIN C) 500 MG tablet Take 500 mg by mouth 2 (two) times daily.     Marland Kitchen atorvastatin (LIPITOR) 20 MG tablet Take 1 tablet (20 mg total) by mouth daily. (Patient taking differently: Take 20 mg by mouth every evening.)   03/10/2021 at 1800  . escitalopram (LEXAPRO) 20 MG tablet Take 1 tablet (20 mg total) by mouth daily. 90 tablet 0 03/10/2021 at 0900  . famotidine (PEPCID) 20 MG tablet Take 20 mg by mouth daily.     . ferrous sulfate 325 (65 FE) MG EC tablet Take 1 tablet (325 mg total) by mouth daily with breakfast. (Patient  taking differently: Take 325 mg by mouth at bedtime.) 30 tablet 3   . levothyroxine (SYNTHROID) 100 MCG tablet Take 1 tablet (100 mcg total) by mouth daily at 6 (six) AM.   03/10/2021 at 0600  . loperamide (IMODIUM) 2 MG capsule Take 1 capsule (2 mg total) by mouth every 6 (six) hours as needed for diarrhea or loose stools. 30 capsule 0 Unknown at PRN  . montelukast (SINGULAIR) 10 MG tablet TAKE 1 TABLET BY MOUTH EVERYDAY AT BEDTIME (Patient taking differently: Take 10 mg by mouth at bedtime. TAKE 1 TABLET BY MOUTH EVERYDAY AT BEDTIME) 90 tablet 3 03/10/2021 at 2000  . ondansetron (ZOFRAN-ODT) 4 MG disintegrating tablet Take 4 mg by mouth every 6 (six) hours as needed for nausea or vomiting.   Unknown at PRN  . oxybutynin (DITROPAN XL) 15 MG 24 hr tablet Take 1 tablet (15 mg total) by mouth at bedtime.   03/10/2021 at 2000  . sodium chloride (OCEAN) 0.65 % SOLN nasal spray Place 2 sprays into both nostrils every 6 (six) hours.     . traMADol (ULTRAM) 50 MG tablet Take 50 mg by mouth every 6 (six) hours.     . traZODone (DESYREL) 100 MG tablet Take 1 tablet (100 mg total) by mouth at bedtime.   03/10/2021 at 2000  .  vitamin B-12 (CYANOCOBALAMIN) 1000 MCG tablet Take 1,000 mcg by mouth daily.     Marland Kitchen buPROPion (WELLBUTRIN XL) 150 MG 24 hr tablet Take 1 tablet (150 mg total) by mouth daily. (Patient not taking: No sig reported) 30 tablet 0 Not Taking at Unknown time  . diclofenac Sodium (VOLTAREN) 1 % GEL Apply 2 g topically 4 (four) times daily as needed (left knee pain). (Patient not taking: No sig reported)   Not Taking at Unknown time  . neomycin-bacitracin-polymyxin (NEOSPORIN) OINT Apply 1 application topically 2 (two) times daily. (Patient not taking: No sig reported)   Not Taking at Unknown time   Scheduled:  . atorvastatin  20 mg Oral QHS  . buPROPion  150 mg Oral Daily  . escitalopram  20 mg Oral Daily  . famotidine  20 mg Oral Daily  . feeding supplement  237 mL Oral TID BM  . ferrous sulfate  325  mg Oral QHS  . levothyroxine  100 mcg Oral Q0600  . lidocaine  1 patch Transdermal Q24H  . mouth rinse  15 mL Mouth Rinse BID  . melatonin  5 mg Oral QHS  . montelukast  10 mg Oral QHS  . multivitamin with minerals   Oral Daily  . oxybutynin  15 mg Oral QHS  . polyethylene glycol  17 g Oral BID  . senna-docusate  2 tablet Oral BID  . vitamin B-12  1,000 mcg Oral Daily  . warfarin  3 mg Oral ONCE-1600  . Warfarin - Pharmacist Dosing Inpatient   Does not apply q1600   Infusions:   PRN: fentaNYL, haloperidol, midazolam, ondansetron **OR** ondansetron (ZOFRAN) IV, oxyCODONE, sodium phosphate, traZODone Anti-infectives (From admission, onward)   Start     Dose/Rate Route Frequency Ordered Stop   03/12/21 1000  cefTRIAXone (ROCEPHIN) 1 g in sodium chloride 0.9 % 100 mL IVPB  Status:  Discontinued        1 g 200 mL/hr over 30 Minutes Intravenous Every 24 hours 03/11/21 2223 03/13/21 1402   03/11/21 2315  azithromycin (ZITHROMAX) 500 mg in sodium chloride 0.9 % 250 mL IVPB  Status:  Discontinued        500 mg 250 mL/hr over 60 Minutes Intravenous Every 24 hours 03/11/21 2223 03/12/21 0842   03/11/21 1100  cefTRIAXone (ROCEPHIN) 1 g in sodium chloride 0.9 % 100 mL IVPB        1 g 200 mL/hr over 30 Minutes Intravenous  Once 03/11/21 1056 03/11/21 1131      Assessment: Pharmacy consulted for ACS. Trop elevated on admission at 543.  She was on Coumadin prior to her stroke, and while she was inpatient in [facility] acute rehab her consulting physician switched her over to Eliquis. Now, medical team is switching patient back to warfarin. Patient was on prescribed warfarin prior to apixaban for antiphospholipid antibody syndrome. Prior notes state goal INR was 2.5 to 3.5. Per  Dr. Ubaldo Glassing, recommended to have goal INR on the lower end of 2 to 3, so will aim for this while inpatient. She underwent a bone biopsy this morning  DDIs: APAP (increase bleeding), escitalopram (increase bleeding),  hypothroidism (decreases effect of warfarin)  Home regimen in January 2022 was warfarin 2mg : Tu,Th, Sa,Su Warfarin 3mg : M,W,F.   Date INR Warfarin Dose/Comment  4/8 -- Last eliquis dose 4/8 @1800   4/9 1.6* **  4/10 -- 2 mg**  4/11 1.4 4 mg**  4/12 1.8 3 mg  4/13 2.8 hold  4/14 2.8 hold  4/15 2.2 3 mg  * Baseline INR elevated (1.6 on admit) likely ISO eliquis PTA (last dose 4/8 1800). **Pt received CTX (4/9-11) & azith (4/9-10).   Goal of Therapy:  INR: 2 - 3. (while inpatient; PTA was 2.5-3.5) Monitor platelets by anticoagulation protocol: Yes    Plan:   INR therapeutic but with a rapid drop from yesterday   Will re-order patient's home dose of Warfarin 3mg  x1 again tonight  Daily INR ordered.  Pearla Dubonnet, PharmD Clinical Pharmacist 03/17/2021 12:20 PM

## 2021-03-17 NOTE — Progress Notes (Signed)
Occupational Therapy Treatment Patient Details Name: [name] MRN: [id number] DOB: 08-14-1951 Today's Date: 03/17/2021    History of present illness 70 y.o. female who presents emergency department for chief concerns of confusion. MRI reveals embolic pattern acute infarcts involving the infra and supratentorial brain and extensive chronic ischemic injury. PMH: hypertension, history of right trimalleolar ankle fracture, hypothyroid, history of DVT, hyperlipidemia, depression, mild cognitive decline, history of CVA, right PCA infarct.   OT comments  [name] presents today with generalized weakness, reduced endurance, and confusion. She denies pain today. Pt able to engage in bed mobility, transfers, static standing, with Mod A +2 for safety, given instability on her feet, impulsivity, and difficulty following directions. She is pleasant and talkative today, eager to engage with therapy, and participates in grooming, dressing, toileting tasks, but requires ongoing instructions/encouragement to continue participation and to sequence tasks. DC to SNF remains appropriate plan for this pt.    Follow Up Recommendations  SNF    Equipment Recommendations  None recommended by OT    Recommendations for Other Services      Precautions / Restrictions Precautions Precautions: Fall Restrictions Weight Bearing Restrictions: Yes RLE Weight Bearing: Weight bearing as tolerated       Mobility Bed Mobility Overal bed mobility: Needs Assistance Bed Mobility: Supine to Sit     Supine to sit: Mod assist     General bed mobility comments: verbal and tactile cues required for sequencing    Transfers Overall transfer level: Needs assistance Equipment used: Rolling walker (2 wheeled) Transfers: Sit to/from [facility] Sit to Stand: +2 physical assistance;Mod assist Stand pivot transfers: +2 physical assistance;Mod assist            Balance Overall balance  assessment: Needs assistance Sitting-balance support: No upper extremity supported;Feet supported Sitting balance-Leahy Scale: Good     Standing balance support: Bilateral upper extremity supported Standing balance-Leahy Scale: Poor Standing balance comment: +2 Mod A for safety                           ADL either performed or assessed with clinical judgement   ADL Overall ADL's : Needs assistance/impaired Eating/Feeding: Minimal assistance;Cueing for sequencing;Sitting   Grooming: Wash/dry hands;Wash/dry face;Sitting;Brushing hair;Minimal assistance;Cueing for sequencing Grooming Details (indicate cue type and reason): VCs for task continuation Upper Body Bathing: Minimal assistance;Sitting Upper Body Bathing Details (indicate cue type and reason): VCs for task continuation     Upper Body Dressing : Sitting;Cueing for sequencing;Minimal assistance Upper Body Dressing Details (indicate cue type and reason): doffing/donning hospital gown. Lower Body Dressing: Maximal assistance;Sitting/lateral leans Lower Body Dressing Details (indicate cue type and reason): changing soiled sock     Toileting- Clothing Manipulation and Hygiene: Maximal assistance               Vision Baseline Vision/History: Wears glasses Patient Visual Report: No change from baseline     Perception     Praxis      Cognition Arousal/Alertness: Awake/alert Behavior During Therapy: WFL for tasks assessed/performed Overall Cognitive Status: Impaired/Different from baseline                                 General Comments: Pt A, Oriented to self, place. Pleasantly confused and talkative.        Exercises Other Exercises Other Exercises: Bed mobility, transfers, grooming, dressing, toileting, eating   Shoulder Instructions  General Comments      Pertinent Vitals/ Pain       Pain Score: 0-No pain  Home Living                                           Prior Functioning/Environment              Frequency  Min 1X/week        Progress Toward Goals  OT Goals(current goals can now be found in the care plan section)  Progress towards OT goals: Progressing toward goals  Acute Rehab OT Goals Patient Stated Goal: to get up OT Goal Formulation: With patient Time For Goal Achievement: 03/27/21 Potential to Achieve Goals: Good  Plan Discharge plan remains appropriate;Frequency remains appropriate    Co-evaluation                 AM-PAC OT "6 Clicks" Daily Activity     Outcome Measure   Help from another person eating meals?: A Little Help from another person taking care of personal grooming?: A Little Help from another person toileting, which includes using toliet, bedpan, or urinal?: A Lot Help from another person bathing (including washing, rinsing, drying)?: A Lot Help from another person to put on and taking off regular upper body clothing?: A Lot Help from another person to put on and taking off regular lower body clothing?: A Lot 6 Click Score: 14    End of Session Equipment Utilized During Treatment: Rolling walker  OT Visit Diagnosis: Muscle weakness (generalized) (M62.81);Other symptoms and signs involving cognitive function;Unsteadiness on feet (R26.81)   Activity Tolerance Patient tolerated treatment well   Patient Left in chair;with call bell/phone within reach;with chair alarm set   Nurse Communication Mobility status        Time: 3532-9924 OT Time Calculation (min): 35 min  Charges: OT General Charges $OT Visit: 1 Visit OT Treatments $Self Care/Home Management : 23-37 mins  Josiah Lobo, PhD, MS, OTR/L 03/17/21, 1:16 PM

## 2021-03-17 NOTE — TOC Transition Note (Signed)
Transition of Care [facility]) - CM/SW Discharge Note   Patient Details  Name: [name] MRN: [id number] Date of Birth: [birth year]  [facility] CM/SW Contact:  Shelbie Hutching, RN Phone Number: 03/17/2021, 12:09 PM   Clinical Narrative:    Insurance authorization approved after Peer to peer.  Patient is approved from today through 4/19.  [facility] given approval information.  COVID test negative.  [facility] arranged for 1530.  Patient's husband is being moved over to [place] from [place] at 1pm today.   RNCM will secure email discharge summary and orders to admissions at [facility].   Final next level of care: Skilled Nursing Facility Barriers to Discharge: Barriers Resolved   Patient Goals and CMS Choice Patient states their goals for this hospitalization and ongoing recovery are:: SNF rehab and transition to LTC CMS Medicare.gov Compare Post Acute Care list provided to:: Patient Represenative (must comment) Choice offered to / list presented to : Adult Children  Discharge Placement              Patient chooses bed at: [facility] Patient to be transferred to facility by: [facility] Name of family member notified: Hillary Patient and family notified of of transfer: 03/17/21  Discharge Plan and Services                DME Arranged: N/A DME Agency: NA       HH Arranged: NA          Social Determinants of Health (SDOH) Interventions     Readmission Risk Interventions No flowsheet data found.

## 2021-03-17 NOTE — Discharge Summary (Signed)
[name] ZYS:[id number] DOB: [birth year] DOA: 03/11/2021  PCP: Juline Patch, MD  Admit date: 03/11/2021 Discharge date: 03/17/2021  Admitted From: SNF Disposition:  SNF  Recommendations for Outpatient Follow-up:  1. Follow up with PCP in 1 week 2. Please obtain BMP/CBC in one week 3. Follow up with Dr. Janese Banks April 22nd at 9:30am 4. Follow up with Orthopedic (daughter will give appointment time) 5. Follow up with [facility] in 6 weeks 6. Follow with neurology in one week 7. Follow up with cardiology Dr. Ubaldo Glassing in one week (needs outpatient monitor)     Discharge Condition:Stable CODE STATUS:DNR  Diet recommendation: Heart Healthy  Brief/Interim Summary: Per XNA:TFTDDU Jerilynn Mages Fouts is a 70 y.o. female with medical history significant for hypertension, history of right trimalleoli are fracture, hypothyroid, history of DVT and antiphospholipid antibody syndrome previously on Coumadin, CKD 3B, hyperlipidemia, depression, mild cognitive decline, history of CVA, right PCA infarct, presents emergency department for chief concerns of confusion.She presented from [facility], for chief concern of intermittent confusion, for three weeks and worse int he last 2-3 days.   CT of the head was negative for acute abnormality.  MRI of brain was done revealing  Shower of embolic pattern acute infarcts involving the infra and supratentorial brain. Neurology was consulted. Cardiology was also consulted. Patients Eliquis was discontinued and started on coumadin with heparin bridge for goal INR 2-3 Patient has antiphospholipid antibody syndrome/ normocytic anemia and oncology was also consulted.     1.Acute metabolic encephalopathy in a patient with multiple medical issues: Developing vascular dementia and cognition deficits. At baseline and improved.  Found to have acute a stroke as above Minimize benzodiazepines and opiates.    2.Suspected UTI: Patient with recurrent UTI.   Urinalysis abnormal.  Previous Citrobacter.  Procalcitonin very high with no other explanation of infection. was given rocephin.  ucx with 20,000 diptheroids only.  UTI ruled out Rocephin was d/c'd  Leukocytosis has improved.  Afebrile.     3.Elevated troponin- Echo with EF of 50 to 55%.  Grade 2 diastolic dysfunction.  No regional wall motion abnormality.   cardiology following in hospital , will need outpatient follow up Not a candidate for invasive cardiac evaluation due to multiple comorbidities including recent CVA, ARI Recommended conservative management       4.Acute kidney injury on chronic kidney disease stage IIIb: Patient was treated with IV fluids.  Renal functions improved.  Her baseline creatinine reported about 1.8.   Encouraged po intake and hydration   5.Antiphospholipid antibody syndrome/hypercoagulable condition/recurrent cardioembolic stroke: Repeat MRI showed multiple cardioembolic showering embolism.  Diagnosed antiphospholipid antibody syndrome, changed to Eliquis 3 months ago.  Will treat as failure of Eliquis as she was consistently taking this at nursing home. Coumadin 73m today, need INR checked in am , if remains therapeutic can continue on 352mor adjust accordingly. Goal INR is 2-3. Sinus rhythm and rate controlled. Stroke Risk Factors - antiphospholipid antibody syndrome,  HLD, HTN, renal insufficiency and 2 prior strokes  Does need to f/u with cardiology to get assessed for whether atrial fibrillation is present with monitor/[facility] On[facility] on obard too.    6.Right ankle fracture: Conservative management.  Nonweightbearing on cast. Needs to follow up with orthopedics for cast removal. Daughter Stated already has an appointment in May.  7.Hypothyroidism: On Synthroid  TSH normal.  8.Anemia of chronic disease/normocytic anemia:    Iron and ferritin level were normal.  B12 level is adequate and on replacement.  Stool occult  negative May need screening exam including EGD and colonoscopy, however with acute embolic stroke may postpone the procedure. Hematology consulted input was appreciated.-Checking anemia work-up including folate level, reticulocyte count, LDH, B1 and B6, copper, haptoglobin and multiple myeloma panel B12 level normal, folate normal, LDH elevated S/p bone marrow biopsy on 4/14 since had anemia as well as concomitant thrombocytopenia.  - will need to f/u with oncology/hematology Dr. Janese Banks for results and further management.    9.Subcapsular Splenic hematoma with unknown chronicity.  Found during this hospitalization . H/h stable while on coumadin and inr being therapeutic.  Vascular and general surgery was consulted- no surgical intervention needed . Will need to f/u with general surgery in 6 weeks for repeat abd. U/s to make sure still stable or decreased They did not feel the drop in h/h was from this hematoma Ct of abdomen and abd u/s was done see results below  10. Adrenal nodule- f/u ct /MRI in 12 months is recommended to rule out neoplasm. Was found on CT scan.     Discharge Diagnoses:  Active Problems:   Adult hypothyroidism   Essential hypertension   Anticoagulant long-term use   Antiphospholipid antibody syndrome (HCC)   Stage 3 chronic kidney disease (HCC)   Thrombocytopenia (HCC)   CVA (cerebral vascular accident) ([employer])   Ischemic cerebrovascular accident (CVA) of frontal lobe (HCC)   Embolism of left anterior cerebral artery   AKI (acute kidney injury) ([place])   Pressure injury of skin   Encephalopathy   Normocytic anemia    Discharge Instructions  Discharge Instructions    Call MD for:  temperature >100.4   Complete by: As directed    Diet - low sodium heart healthy   Complete by: As directed    Discharge wound care:   Complete by: As directed    As above   Increase activity slowly   Complete by: As directed      Allergies as of 03/17/2021      Reactions    Penicillin G Rash      Medication List    STOP taking these medications   acetaminophen 325 MG tablet Commonly known as: TYLENOL   apixaban 5 MG Tabs tablet Commonly known as: ELIQUIS   loperamide 2 MG capsule Commonly known as: IMODIUM   traMADol 50 MG tablet Commonly known as: ULTRAM     TAKE these medications   ascorbic acid 500 MG tablet Commonly known as: VITAMIN C Take 500 mg by mouth 2 (two) times daily.   atorvastatin 20 MG tablet Commonly known as: LIPITOR Take 1 tablet (20 mg total) by mouth daily. What changed: when to take this   buPROPion 150 MG 24 hr tablet Commonly known as: Wellbutrin XL Take 1 tablet (150 mg total) by mouth daily.   diclofenac Sodium 1 % Gel Commonly known as: VOLTAREN Apply 2 g topically 4 (four) times daily as needed (left knee pain).   escitalopram 20 MG tablet Commonly known as: LEXAPRO Take 1 tablet (20 mg total) by mouth daily.   famotidine 20 MG tablet Commonly known as: PEPCID Take 20 mg by mouth daily.   feeding supplement Liqd Take 237 mLs by mouth 3 (three) times daily between meals.   ferrous sulfate 325 (65 FE) MG EC tablet Take 1 tablet (325 mg total) by mouth daily with breakfast. What changed: when to take this   levothyroxine 100 MCG tablet Commonly known as: SYNTHROID Take 1 tablet (100 mcg total)  by mouth daily at 6 (six) AM.   lidocaine 5 % Commonly known as: LIDODERM Place 1 patch onto the skin daily. Remove & Discard patch within 12 hours or as directed by MD   montelukast 10 MG tablet Commonly known as: SINGULAIR TAKE 1 TABLET BY MOUTH EVERYDAY AT BEDTIME What changed: See the new instructions.   multivitamin with minerals Tabs tablet Take 1 tablet by mouth daily. Start taking on: March 18, 2021   neomycin-bacitracin-polymyxin Oint Commonly known as: NEOSPORIN Apply 1 application topically 2 (two) times daily.   ondansetron 4 MG disintegrating tablet Commonly known as:  ZOFRAN-ODT Take 4 mg by mouth every 6 (six) hours as needed for nausea or vomiting.   oxybutynin 15 MG 24 hr tablet Commonly known as: DITROPAN XL Take 1 tablet (15 mg total) by mouth at bedtime.   oxyCODONE 5 MG immediate release tablet Commonly known as: Oxy IR/ROXICODONE Take 1 tablet (5 mg total) by mouth every 6 (six) hours as needed for up to 3 days for moderate pain or severe pain.   polyethylene glycol 17 g packet Commonly known as: MIRALAX / GLYCOLAX Take 17 g by mouth 2 (two) times daily.   senna-docusate 8.6-50 MG tablet Commonly known as: Senokot-S Take 2 tablets by mouth 2 (two) times daily.   sodium chloride 0.65 % Soln nasal spray Commonly known as: OCEAN Place 2 sprays into both nostrils every 6 (six) hours.   sodium phosphate 7-19 GM/118ML Enem Place 133 mLs (1 enema total) rectally daily as needed for severe constipation.   traZODone 100 MG tablet Commonly known as: DESYREL Take 1 tablet (100 mg total) by mouth at bedtime.   vitamin B-12 1000 MCG tablet Commonly known as: CYANOCOBALAMIN Take 1,000 mcg by mouth daily.   warfarin 3 MG tablet Commonly known as: COUMADIN Take 1 tablet (3 mg total) by mouth one time only at 4 PM.            Discharge Care Instructions  (From admission, onward)         Start     Ordered   03/17/21 0000  Discharge wound care:       Comments: As above   03/17/21 1254          Follow-up Information    Tylene Fantasia, PA-C Follow up in 6 week(s).   Specialty: Physician Assistant Contact information: [address] [place] [address] [phone]        Sindy Guadeloupe, MD Follow up in 1 week(s).   Specialty: Oncology Why: I think has appointment friday april 22 at 9:30am. please check Contact information: [place] [address] [phone]        Juline Patch, MD Follow up in 1 week(s).   Specialty: Family Medicine Contact information: [address] [place] [address] [phone]        Teodoro Spray, MD Follow up in 1 week(s).   Specialty: Cardiology Why: hospital f/u and needs monitor for afib detection.  Contact information: [place] [address] [phone]        Vladimir Crofts, MD Follow up in 1 week(s).   Specialty: Neurology Contact information: [place] [facility] [place] [address] [phone]              Allergies  Allergen Reactions  . Penicillin G Rash    Consultations:  Vascular, general surgery, neurology, cardiology, oncology  Procedures/Studies: CT ABDOMEN PELVIS WO CONTRAST  Result Date: 03/14/2021 CLINICAL DATA:  Left upper quadrant abdominal pain. EXAM: CT ABDOMEN AND PELVIS WITHOUT CONTRAST TECHNIQUE: Multidetector CT imaging of the abdomen and pelvis was performed following the standard protocol without IV contrast. COMPARISON:  Ultrasound of same day. CT of March 11, 2021 and January 09, 2021. FINDINGS: Lower chest: Small left pleural effusion is noted with minimal adjacent subsegmental atelectasis. Hepatobiliary: No focal liver abnormality is seen. Status post cholecystectomy. No biliary dilatation. Pancreas: Unremarkable. No pancreatic ductal dilatation or surrounding inflammatory changes. Spleen: Stable size and appearance of oval-shaped low density involving inferior and anterior portion of the spleen most consistent with subcapsular hematoma. Infarction is felt to be less likely. Adrenals/Urinary Tract: 2 cm right adrenal nodule is noted which is unchanged compared to prior exams. Left adrenal gland is unremarkable. Bilateral renal cortical thinning and scarring is noted. No hydronephrosis or renal obstruction is noted. No renal or ureteral calculi are noted. Urinary bladder is unremarkable. Stomach/Bowel: The stomach and appendix appear normal. There is no evidence of bowel obstruction. No inflammation is noted.  Large amount of stool is noted in the distal sigmoid colon and rectum concerning for impaction. Vascular/Lymphatic: Aortic atherosclerosis. No enlarged abdominal or pelvic lymph nodes. Reproductive: Uterus and bilateral adnexa are unremarkable. Other: No abdominal wall hernia or abnormality. No abdominopelvic ascites. Musculoskeletal: No acute or significant osseous findings. IMPRESSION: Grossly stable size and appearance of oval-shaped low density involving inferior anterior portion of the spleen most consistent with subcapsular hematoma. Infarction is felt to be less likely. As recommended by ultrasound exam today, follow-up ultrasound in several weeks is recommended to ensure stability or resolution of this abnormality. These results were called by telephone at the time of interpretation on 03/14/2021 at 6:17 pm to provider [facility] , who verbally acknowledged these results. 2 cm right adrenal nodule is noted. Follow-up CT or MRI in 12 months is recommended to rule out neoplasm. Large amount of stool is noted in the distal sigmoid colon and rectum concerning for impaction. Aortic Atherosclerosis (ICD10-I70.0). Electronically Signed   By: Marijo Conception M.D.   On: 03/14/2021 18:17   DG Chest 2 View  Result Date: 03/11/2021 CLINICAL DATA:  Confusion EXAM: CHEST - 2 VIEW COMPARISON:  January 08, 2021 FINDINGS: Interstitium is mildly thickened. No edema or airspace opacity. Heart size and pulmonary vascularity are normal. No adenopathy. No bone lesions. Surgical clips noted in gallbladder fossa region. IMPRESSION: Interstitial thickening, likely indicative of underlying chronic bronchitis. No edema or airspace opacity. Cardiac silhouette normal. Electronically Signed   By: Lowella Grip III M.D.   On: 03/11/2021 09:56   DG Ankle Complete Right  Result Date: 02/22/2021 CLINICAL DATA:  Right ankle pain, fell EXAM: RIGHT ANKLE - COMPLETE 3+ VIEW COMPARISON:  05/23/2020 FINDINGS: Frontal, oblique,  lateral views of the right ankle are obtained. There is an oblique displaced lateral malleolar fracture, with lateral subluxation of the talus relative to the tibial plafond. A minimally comminuted transverse medial malleolar fracture is also noted, with mild distraction. The minimally displaced fracture through the posterior malleolus is noted on lateral view. There is diffuse soft tissue swelling. Large inferior calcaneal spur. IMPRESSION: 1. Trimalleolar right ankle fracture, with lateral subluxation of the talus. 2. Diffuse soft tissue swelling. Electronically Signed   By: Randa Ngo M.D.   On: 02/22/2021 20:41   CT Head Wo Contrast  Result Date: 03/11/2021 CLINICAL DATA:  Altered mental status/confusion EXAM: CT HEAD WITHOUT  CONTRAST TECHNIQUE: Contiguous axial images were obtained from the base of the skull through the vertex without intravenous contrast. COMPARISON:  February 22, 2021 FINDINGS: Brain: Moderate diffuse atrophy is stable. There is no intracranial mass, hemorrhage, extra-axial fluid collection, or midline shift. Decreased attenuation is noted with encephalomalacia in the medial right occipital lobe consistent with prior infarct. Prior infarct also noted in the posterosuperior right parietal lobe, stable. A prior infarct is noted in the posterior aspect of the mid left cerebellum, stable. There is mild decreased attenuation in portions of the centra semiovale bilaterally. No acute appearing infarct is evident. Vascular: There is no hyperdense vessel. There is calcification in each carotid siphon region. Skull: The bony calvarium appears intact. Sinuses/Orbits: There is slight mucosal thickening in several ethmoid air cells. Visualized orbits appear symmetric bilaterally. Other: Mastoid air cells are clear. IMPRESSION: Stable atrophy. Prior infarcts in the medial right occipital lobe, superior posterior right parietal lobe, and posterior mid left cerebellum. Decreased attenuation in the  periventricular white matter is likely due to small vessel vascular disease, although a mild degree of interstitial edema secondary to the ventricular enlargement could present in this manner. No acute infarct evident. No mass or hemorrhage. There are foci of arterial vascular calcification. There is slight mucosal thickening in several ethmoid air cells. Electronically Signed   By: Lowella Grip III M.D.   On: 03/11/2021 10:00   CT Head Wo Contrast  Result Date: 02/22/2021 CLINICAL DATA:  [place]. EXAM: CT HEAD WITHOUT CONTRAST CT CERVICAL SPINE WITHOUT CONTRAST TECHNIQUE: Multidetector CT imaging of the head and cervical spine was performed following the standard protocol without intravenous contrast. Multiplanar CT image reconstructions of the cervical spine were also generated. COMPARISON:  None. FINDINGS: CT HEAD FINDINGS Brain: Age advanced cerebral atrophy and associated ventriculomegaly. Evidence of multiple prior infarcts most notably in the right occipital lobe with associated encephalomalacia and ex vacuo dilatation of the occipital horn of the right lateral ventricle. I do not see any findings suggestive of an acute hemispheric infarction. No intracranial hemorrhage. No extra-axial fluid collections. Vascular: Vascular calcifications but no definite aneurysm or hyperdense vessels. Skull: No skull fracture or bone lesions. Sinuses/Orbits: The paranasal sinuses and mastoid air cells are clear. The globes are intact. Other: No scalp lesions or scalp hematoma. CT CERVICAL SPINE FINDINGS Alignment: Normal overall alignment of the cervical vertebral bodies. Skull base and vertebrae: No acute fracture. No primary bone lesion or focal pathologic process. Soft tissues and spinal canal: No prevertebral fluid or swelling. No visible canal hematoma. Disc levels: Degenerative cervical spondylosis with disc disease and facet disease most notable at C4-5 and C5-6. No large disc protrusions or significant spinal  stenosis. Uncinate spurring and facet disease but no significant foraminal stenosis. Upper chest: The visualized lung apices are grossly clear. Other: No neck mass, adenopathy or hematoma. IMPRESSION: 1. Age advanced cerebral atrophy and associated ventriculomegaly. 2. Evidence of multiple prior infarcts. 3. No acute intracranial findings or skull fracture. 4. Degenerative cervical spondylosis with disc disease and facet disease but no acute cervical spine fracture. Electronically Signed   By: Marijo Sanes M.D.   On: 02/22/2021 21:52   CT Cervical Spine Wo Contrast  Result Date: 02/22/2021 CLINICAL DATA:  [place]. EXAM: CT HEAD WITHOUT CONTRAST CT CERVICAL SPINE WITHOUT CONTRAST TECHNIQUE: Multidetector CT imaging of the head and cervical spine was performed following the standard protocol without intravenous contrast. Multiplanar CT image reconstructions of the cervical spine were also generated. COMPARISON:  None. FINDINGS: CT  HEAD FINDINGS Brain: Age advanced cerebral atrophy and associated ventriculomegaly. Evidence of multiple prior infarcts most notably in the right occipital lobe with associated encephalomalacia and ex vacuo dilatation of the occipital horn of the right lateral ventricle. I do not see any findings suggestive of an acute hemispheric infarction. No intracranial hemorrhage. No extra-axial fluid collections. Vascular: Vascular calcifications but no definite aneurysm or hyperdense vessels. Skull: No skull fracture or bone lesions. Sinuses/Orbits: The paranasal sinuses and mastoid air cells are clear. The globes are intact. Other: No scalp lesions or scalp hematoma. CT CERVICAL SPINE FINDINGS Alignment: Normal overall alignment of the cervical vertebral bodies. Skull base and vertebrae: No acute fracture. No primary bone lesion or focal pathologic process. Soft tissues and spinal canal: No prevertebral fluid or swelling. No visible canal hematoma. Disc levels: Degenerative cervical spondylosis  with disc disease and facet disease most notable at C4-5 and C5-6. No large disc protrusions or significant spinal stenosis. Uncinate spurring and facet disease but no significant foraminal stenosis. Upper chest: The visualized lung apices are grossly clear. Other: No neck mass, adenopathy or hematoma. IMPRESSION: 1. Age advanced cerebral atrophy and associated ventriculomegaly. 2. Evidence of multiple prior infarcts. 3. No acute intracranial findings or skull fracture. 4. Degenerative cervical spondylosis with disc disease and facet disease but no acute cervical spine fracture. Electronically Signed   By: Marijo Sanes M.D.   On: 02/22/2021 21:52   MR BRAIN WO CONTRAST  Result Date: 03/12/2021 CLINICAL DATA:  Confusion. Recent history of stroke. Hypercoagulable. EXAM: MRI HEAD WITHOUT CONTRAST TECHNIQUE: Multiplanar, multiecho pulse sequences of the brain and surrounding structures were obtained without intravenous contrast. COMPARISON:  12/26/2020 FINDINGS: Brain: Innumerable subcentimeter mainly cortically based acute infarcts along the bilateral cerebral convexity with predilection for the watershed regions. Similar size infarcts are seen in the bilateral cerebellum. Numerous pre-existing cerebellar and cortical infarcts with generalized brain atrophy. Large remote right occipital infarct with dense encephalomalacia. No acute hemorrhage, hydrocephalus, or mass. Vascular: Major flow voids are preserved Skull and upper cervical spine: Normal marrow signal Sinuses/Orbits: Negative IMPRESSION: 1. Shower of embolic pattern acute infarcts involving the infra and supratentorial brain. 2. Extensive chronic ischemic injury. Electronically Signed   By: Monte Fantasia M.D.   On: 03/12/2021 11:52   CT BONE MARROW BIOPSY & ASPIRATION  Result Date: 03/16/2021 INDICATION: Anemia Thrombocytopenia EXAM: CT GUIDED BONE MARROW ASPIRATES AND BIOPSY MEDICATIONS: None. ANESTHESIA/SEDATION: Fentanyl 100 mcg IV; Versed 2 mg IV  Moderate Sedation Time:  12 minutes The patient was continuously monitored during the procedure by the interventional radiology nurse under my direct supervision. COMPLICATIONS: None immediate. PROCEDURE: The procedure was explained to the patient. The risks and benefits of the procedure were discussed and the patient's questions were addressed. Informed consent was obtained from the patient. The patient was placed prone on CT table. Images of the pelvis were obtained. The right side of back was prepped and draped in sterile fashion. The skin and right posterior ilium were anesthetized with 1% lidocaine. 11 gauge bone needle was directed into the right ilium with CT guidance. Two aspirates and 1 core biopsy were obtained. Bandage placed over the puncture site. IMPRESSION: CT guided bone marrow aspiration and core biopsy. Electronically Signed   By: Miachel Roux M.D.   On: 03/16/2021 14:01   ECHOCARDIOGRAM COMPLETE  Result Date: 03/14/2021    ECHOCARDIOGRAM REPORT   Patient Name:   SHAYONNA OCAMPO Date of Exam: 03/14/2021 Medical Rec #:  [id number]  Height:       68.0 in Accession #:    [id number]         Weight:       184.0 lb Date of Birth:  04/23/1951         BSA:          1.973 m Patient Age:    70 years           BP:           147/76 mmHg Patient Gender: F                  HR:           101 bpm. Exam Location:  [facility] Procedure: 2D Echo, Cardiac Doppler and Color Doppler Indications:     Stroke I63.9  History:         Patient has prior history of Echocardiogram examinations, most                  recent 12/27/2020. Stroke; Risk Factors:Hypertension.  Sonographer:     Sherrie Sport RDCS (AE) Referring Phys:  [id number] Barb Merino Diagnosing Phys: Isaias Cowman MD IMPRESSIONS  1. Left ventricular ejection fraction, by estimation, is 50 to 55%. The left ventricle has low normal function. The left ventricle has no regional wall motion abnormalities. Left ventricular diastolic parameters are consistent  with Grade II diastolic dysfunction (pseudonormalization).  2. Right ventricular systolic function is normal. The right ventricular size is normal.  3. The mitral valve is normal in structure. Mild to moderate mitral valve regurgitation. No evidence of mitral stenosis.  4. The aortic valve is normal in structure. Aortic valve regurgitation is mild. No aortic stenosis is present.  5. The inferior vena cava is normal in size with greater than 50% respiratory variability, suggesting right atrial pressure of 3 mmHg. FINDINGS  Left Ventricle: Left ventricular ejection fraction, by estimation, is 50 to 55%. The left ventricle has low normal function. The left ventricle has no regional wall motion abnormalities. The left ventricular internal cavity size was normal in size. There is no left ventricular hypertrophy. Left ventricular diastolic parameters are consistent with Grade II diastolic dysfunction (pseudonormalization). Right Ventricle: The right ventricular size is normal. No increase in right ventricular wall thickness. Right ventricular systolic function is normal. Left Atrium: Left atrial size was normal in size. Right Atrium: Right atrial size was normal in size. Pericardium: There is no evidence of pericardial effusion. Mitral Valve: The mitral valve is normal in structure. Mild to moderate mitral valve regurgitation. No evidence of mitral valve stenosis. Tricuspid Valve: The tricuspid valve is normal in structure. Tricuspid valve regurgitation is mild . No evidence of tricuspid stenosis. Aortic Valve: The aortic valve is normal in structure. Aortic valve regurgitation is mild. No aortic stenosis is present. Aortic valve mean gradient measures 6.0 mmHg. Aortic valve peak gradient measures 10.0 mmHg. Aortic valve area, by VTI measures 1.78  cm. Pulmonic Valve: The pulmonic valve was normal in structure. Pulmonic valve regurgitation is not visualized. No evidence of pulmonic stenosis. Aorta: The aortic root is  normal in size and structure. Venous: The inferior vena cava is normal in size with greater than 50% respiratory variability, suggesting right atrial pressure of 3 mmHg. IAS/Shunts: No atrial level shunt detected by color flow Doppler.  LEFT VENTRICLE PLAX 2D LVIDd:         4.40 cm  Diastology LVIDs:         3.26 cm  LV  e' medial:    6.64 cm/s LV PW:         0.98 cm  LV E/e' medial:  10.3 LV IVS:        0.93 cm  LV e' lateral:   5.00 cm/s LVOT diam:     2.00 cm  LV E/e' lateral: 13.7 LV SV:         60 LV SV Index:   30 LVOT Area:     3.14 cm  RIGHT VENTRICLE RV Basal diam:  2.97 cm RV S prime:     9.57 cm/s TAPSE (M-mode): 3.1 cm LEFT ATRIUM             Index       RIGHT ATRIUM           Index LA diam:        4.00 cm 2.03 cm/m  RA Area:     13.90 cm LA Vol (A2C):   60.5 ml 30.67 ml/m RA Volume:   31.30 ml  15.87 ml/m LA Vol (A4C):   62.6 ml 31.73 ml/m LA Biplane Vol: 66.5 ml 33.71 ml/m  AORTIC VALVE                    PULMONIC VALVE AV Area (Vmax):    1.41 cm     PV Vmax:        0.75 m/s AV Area (Vmean):   1.29 cm     PV Peak grad:   2.2 mmHg AV Area (VTI):     1.78 cm     RVOT Peak grad: 2 mmHg AV Vmax:           158.33 cm/s AV Vmean:          116.000 cm/s AV VTI:            0.337 m AV Peak Grad:      10.0 mmHg AV Mean Grad:      6.0 mmHg LVOT Vmax:         71.10 cm/s LVOT Vmean:        47.600 cm/s LVOT VTI:          0.191 m LVOT/AV VTI ratio: 0.57  AORTA Ao Root diam: 2.83 cm MITRAL VALVE                TRICUSPID VALVE MV Area (PHT): 3.60 cm     TR Peak grad:   33.2 mmHg MV Decel Time: 211 msec     TR Vmax:        288.00 cm/s MV E velocity: 68.60 cm/s MV A velocity: 117.00 cm/s  SHUNTS MV E/A ratio:  0.59         Systemic VTI:  0.19 m                             Systemic Diam: 2.00 cm Isaias Cowman MD Electronically signed by Isaias Cowman MD Signature Date/Time: 03/14/2021/1:55:06 PM    Final    CT Renal Stone Study  Result Date: 03/11/2021 CLINICAL DATA:  70 year old with chronic kidney  disease, presenting with a urinary tract infection. EXAM: CT ABDOMEN AND PELVIS WITHOUT CONTRAST TECHNIQUE: Multidetector CT imaging of the abdomen and pelvis was performed following the standard protocol without IV contrast. COMPARISON:  01/09/2021. FINDINGS: Lower chest: LEFT pleural effusion and associated mild passive atelectasis in th, broad-based central disc protrusion and multifactorial spinal stenosis at L4-5, and diffuse degenerative  changes e LEFT LOWER LOBE. Visualized lung bases otherwise clear. Heart moderately enlarged. Small pericardial effusion versus pericardial thickening. Hepatobiliary: Normal unenhanced appearance of the liver. Surgically absent gallbladder. No unexpected biliary ductal dilation. Pancreas: Normal unenhanced appearance. Spleen: Subtle low attenuation involving the lower pole of the spleen, not present on the prior CT, with associated edema in the perisplenic fat. Adrenals/Urinary Tract: Normal appearing adrenal glands. Scarring and mild cortical thinning involving both kidneys. Allowing for the unenhanced technique, no significant focal parenchymal abnormalities involving either kidney. No hydronephrosis. No urinary tract calculi. Normal appearing mildly distended urinary bladder. Stomach/Bowel: Stomach normal in appearance for the degree of distention. Normal-appearing small bowel. Mobile cecum positioned in the RIGHT UPPER QUADRANT. Large rectal colonic stool burden and moderate colonic stool burden elsewhere. High attenuation ingested material within the colon and in the normal appearing appendix located in the RIGHT mid abdomen. No focal colonic abnormality. Vascular/Lymphatic: Mild atherosclerosis involving the abdominal aorta without evidence of aneurysm. No pathologic lymphadenopathy. Reproductive: Normal-appearing uterus and ovaries without evidence of adnexal mass. Other: Mild edema in the abdominal wall overlying both flanks, right greater than left. Musculoskeletal:  Degenerative disc disease and spondylosis at L5-S1, broad-based central disc protrusion and multifactorial spinal stenosis at L4-5 and diffuse facet degenerative changes throughout the lumbar spine. No acute findings. IMPRESSION: 1. Subtle low attenuation involving the lower pole of the spleen, not present on the prior CT, with associated edema in the perisplenic fat, possibly indicating splenic infarct or splenic abscess. CT abdomen with contrast may be confirmatory. If the patient's renal function does not allow intravenous contrast administration, then ultrasound may be helpful in further evaluation. 2. No acute abnormalities otherwise involving the abdomen or pelvis. 3. LEFT pleural effusion and associated mild passive atelectasis in the LEFT LOWER LOBE. 4. Small pericardial effusion versus pericardial thickening. 5. Aortic Atherosclerosis (ICD10-I70.0). Electronically Signed   By: Evangeline Dakin M.D.   On: 03/11/2021 12:12   US ABDOMINAL PELVIC ART/VENT FLOW DOPPLER LIMITED  Result Date: 03/14/2021 CLINICAL DATA:  Evaluate splenic abnormality on recent CT. EXAM: ULTRASOUND ABDOMEN LIMITED WITH DOPPLER TECHNIQUE: Images were obtained at the area of concern at the spleen. COMPARISON:  CT of the abdomen and pelvis 03/11/2021 FINDINGS: Spleen measures 11.8 x 5.3 x 12.7 cm. Calculated splenic volume is 397 mL. Superior and mid portions of the spleen have normal echotexture. Along the inferior aspect of the spleen there is fluid surrounding a solid structure which has similar echogenicity to the spleen. Suspect this is fluid collection around a small area of abnormal splenic tissue or fluid surrounding a splenic hematoma. These findings correspond with the recent CT findings. This area of concern with surrounding fluid measures 4.1 x 4.0 x 4.5 cm. Arterial and venous flow at the splenic hilum. No significant internal vascularity within the tissue surrounded by fluid. IMPRESSION: Complex fluid collection along  the inferior aspect of spleen corresponding with the abnormality on the recent CT. The echogenic material surrounded by fluid could represent abnormal splenic tissue versus hematoma. The fluid collection and CT findings are more suggestive for a hematoma due to a splenic laceration rather than an infarct or abscess. Consider a follow-up ultrasound in 6-8 weeks to see if the fluid collection resolves. Electronically Signed   By: Markus Daft M.D.   On: 03/14/2021 13:23   US SPLEEN (ABDOMEN LIMITED)  Result Date: 03/14/2021 CLINICAL DATA:  Evaluate splenic abnormality on recent CT. EXAM: ULTRASOUND ABDOMEN LIMITED WITH DOPPLER TECHNIQUE: Images were obtained at the  area of concern at the spleen. COMPARISON:  CT of the abdomen and pelvis 03/11/2021 FINDINGS: Spleen measures 11.8 x 5.3 x 12.7 cm. Calculated splenic volume is 397 mL. Superior and mid portions of the spleen have normal echotexture. Along the inferior aspect of the spleen there is fluid surrounding a solid structure which has similar echogenicity to the spleen. Suspect this is fluid collection around a small area of abnormal splenic tissue or fluid surrounding a splenic hematoma. These findings correspond with the recent CT findings. This area of concern with surrounding fluid measures 4.1 x 4.0 x 4.5 cm. Arterial and venous flow at the splenic hilum. No significant internal vascularity within the tissue surrounded by fluid. IMPRESSION: Complex fluid collection along the inferior aspect of spleen corresponding with the abnormality on the recent CT. The echogenic material surrounded by fluid could represent abnormal splenic tissue versus hematoma. The fluid collection and CT findings are more suggestive for a hematoma due to a splenic laceration rather than an infarct or abscess. Consider a follow-up ultrasound in 6-8 weeks to see if the fluid collection resolves. Electronically Signed   By: Markus Daft M.D.   On: 03/14/2021 13:23      Subjective: No  complaints.  Discharge Exam: Vitals:   03/17/21 0757 03/17/21 1134  BP: (!) 128/94 124/76  Pulse: (!) 103 (!) 101  Resp: 18 16  Temp: 98 F (36.7 C) 98.4 F (36.9 C)  SpO2: 99% 99%   Vitals:   03/17/21 0036 03/17/21 0606 03/17/21 0757 03/17/21 1134  BP: 127/63 118/83 (!) 128/94 124/76  Pulse: 90 93 (!) 103 (!) 101  Resp: _0 Temp: 97.8 F (36.6 C) 98.3 F (36.8 C) 98 F (36.7 C) 98.4 F (36.9 C)  TempSrc: Oral Oral Oral   SpO2: 98% 100% 99% 99%  Weight:      Height:        General: Pt is alert, awake, not in acute distress Cardiovascular: RRR, S1/S2 +, no rubs, no gallops Respiratory: CTA bilaterally, no wheezing, no rhonchi Abdominal: Soft, NT, ND, bowel sounds + Extremities: no edema    The results of significant diagnostics from this hospitalization (including imaging, microbiology, ancillary and laboratory) are listed below for reference.     Microbiology: Recent Results (from the past 240 hour(s))  Urine Culture     Status: Abnormal   Collection Time: 03/11/21  9:26 AM   Specimen: Urine, Random  Result Value Ref Range Status   Specimen Description   Final    URINE, RANDOM Performed at [facility], [address] [place], [place] [address]    Special Requests   Final    NONE Performed at [facility], [address] [place], [place] [address]    Culture (A)  Final    20,000 COLONIES/mL DIPHTHEROIDS(CORYNEBACTERIUM SPECIES) Standardized susceptibility testing for this organism is not available. Performed at [facility], [place] [address], [place], [place] [address]    Report Status 03/12/2021 FINAL  Final  SARS CORONAVIRUS 2 (TAT 6-24 HRS) Nasopharyngeal Nasopharyngeal Swab     Status: None   Collection Time: 03/11/21 11:05 AM   Specimen: Nasopharyngeal Swab  Result Value Ref Range Status   SARS Coronavirus 2 NEGATIVE NEGATIVE Final    Comment: (NOTE) SARS-CoV-2 target nucleic acids are NOT DETECTED.  The  SARS-CoV-2 RNA is generally detectable in upper and lower respiratory specimens during the acute phase of infection. Negative results do not preclude SARS-CoV-2 infection, do  not rule out co-infections with other pathogens, and should not be used as the sole basis for treatment or other patient management decisions. Negative results must be combined with clinical observations, patient history, and epidemiological information. The expected result is Negative.  Fact Sheet for Patients: SugarRoll.be  Fact Sheet for Healthcare Providers: https://www.woods-mathews.com/  This test is not yet approved or cleared by the [place] FDA and  has been authorized for detection and/or diagnosis of SARS-CoV-2 by FDA under an Emergency Use Authorization (EUA). This EUA will remain  in effect (meaning this test can be used) for the duration of the COVID-19 declaration under Se ction 564(b)(1) of the Act, 21 U.S.C. section 360bbb-3(b)(1), unless the authorization is terminated or revoked sooner.  Performed at [facility], [place] [address] [place], [place] [address]   SARS CORONAVIRUS 2 (TAT 6-24 HRS) Nasopharyngeal Nasopharyngeal Swab     Status: None   Collection Time: 03/17/21 12:32 AM   Specimen: Nasopharyngeal Swab  Result Value Ref Range Status   SARS Coronavirus 2 NEGATIVE NEGATIVE Final    Comment: (NOTE) SARS-CoV-2 target nucleic acids are NOT DETECTED.  The SARS-CoV-2 RNA is generally detectable in upper and lower respiratory specimens during the acute phase of infection. Negative results do not preclude SARS-CoV-2 infection, do not rule out co-infections with other pathogens, and should not be used as the sole basis for treatment or other patient management decisions. Negative results must be combined with clinical observations, patient history, and epidemiological information. The expected result is Negative.  Fact Sheet for  Patients: SugarRoll.be  Fact Sheet for Healthcare Providers: https://www.woods-mathews.com/  This test is not yet approved or cleared by the [place] FDA and  has been authorized for detection and/or diagnosis of SARS-CoV-2 by FDA under an Emergency Use Authorization (EUA). This EUA will remain  in effect (meaning this test can be used) for the duration of the COVID-19 declaration under Se ction 564(b)(1) of the Act, 21 U.S.C. section 360bbb-3(b)(1), unless the authorization is terminated or revoked sooner.  Performed at [facility], [place] [address] [place], [place] [address]      Labs: BNP (last 3 results) Recent Labs    03/11/21 1323  BNP 081.4*   Basic Metabolic Panel: Recent Labs  Lab 03/11/21 0926 03/12/21 0553 03/13/21 0402 03/14/21 0457 03/15/21 0443  NA 132* 137 136 136 135  K 3.9 4.1 4.0 3.7 3.5  CL 101 103 104 104 101  CO2 _0 GLUCOSE 112* 88 109* 98 109*  BUN 35* 31* 26* 21 20  CREATININE 2.03* 1.80* 1.77* 1.84* 1.82*  CALCIUM 10.0 9.5 9.7 9.4 9.8  MG  --   --  1.8  --   --   PHOS  --   --  3.5  --   --    Liver Function Tests: Recent Labs  Lab 03/11/21 0926  AST 46*  ALT 44  ALKPHOS 262*  BILITOT 0.9  PROT 7.2  ALBUMIN 2.7*   No results for input(s): LIPASE, AMYLASE in the last 168 hours. Recent Labs  Lab 03/11/21 1323  AMMONIA <9*   CBC: Recent Labs  Lab 03/13/21 0402 03/14/21 0457 03/15/21 0443 03/16/21 0547 03/17/21 0513  WBC 9.5 8.5 12.5* 8.0 8.3  NEUTROABS 7.3 5.7 9.7* 5.7 5.4  HGB 7.6* 7.3* 7.9* 7.7* 7.9*  HCT 23.4* 22.0* 23.8* 24.1* 24.9*  MCV 95.1 95.2 95.6 98.0 98.8  PLT 78* 94* 131* 143* 186  Cardiac Enzymes: No results for input(s): CKTOTAL, CKMB, CKMBINDEX, TROPONINI in the last 168 hours. BNP: Invalid input(s): POCBNP CBG: Recent Labs  Lab 03/12/21 1347  GLUCAP 89   D-Dimer No results for input(s): DDIMER in the last 72 hours. Hgb A1c No  results for input(s): HGBA1C in the last 72 hours. Lipid Profile No results for input(s): CHOL, HDL, LDLCALC, TRIG, CHOLHDL, LDLDIRECT in the last 72 hours. Thyroid function studies No results for input(s): TSH, T4TOTAL, T3FREE, THYROIDAB in the last 72 hours.  Invalid input(s): FREET3 Anemia work up Recent Labs    03/15/21 0443  FOLATE 11.9  RETICCTPCT 4.5*   Urinalysis    Component Value Date/Time   COLORURINE YELLOW (A) 03/11/2021 0926   APPEARANCEUR CLOUDY (A) 03/11/2021 0926   APPEARANCEUR Clear 04/23/2012 2341   LABSPEC 1.008 03/11/2021 0926   LABSPEC 1.021 04/23/2012 2341   PHURINE 6.0 03/11/2021 0926   GLUCOSEU NEGATIVE 03/11/2021 0926   GLUCOSEU Negative 04/23/2012 2341   HGBUR MODERATE (A) 03/11/2021 0926   BILIRUBINUR NEGATIVE 03/11/2021 0926   BILIRUBINUR negative 10/15/2017 1622   BILIRUBINUR Negative 04/23/2012 2341   KETONESUR NEGATIVE 03/11/2021 0926   PROTEINUR NEGATIVE 03/11/2021 0926   UROBILINOGEN 0.2 10/15/2017 1622   NITRITE NEGATIVE 03/11/2021 0926   LEUKOCYTESUR LARGE (A) 03/11/2021 0926   LEUKOCYTESUR Negative 04/23/2012 2341   Sepsis Labs Invalid input(s): PROCALCITONIN,  WBC,  LACTICIDVEN Microbiology Recent Results (from the past 240 hour(s))  Urine Culture     Status: Abnormal   Collection Time: 03/11/21  9:26 AM   Specimen: Urine, Random  Result Value Ref Range Status   Specimen Description   Final    URINE, RANDOM Performed at [facility], [address] [place], [place] [address]    Special Requests   Final    NONE Performed at [facility], [address] [place], [place] [address]    Culture (A)  Final    20,000 COLONIES/mL DIPHTHEROIDS(CORYNEBACTERIUM SPECIES) Standardized susceptibility testing for this organism is not available. Performed at [facility], [place] [address] [place], [place] [address]    Report Status 03/12/2021 FINAL  Final  SARS CORONAVIRUS 2 (TAT 6-24 HRS) Nasopharyngeal  Nasopharyngeal Swab     Status: None   Collection Time: 03/11/21 11:05 AM   Specimen: Nasopharyngeal Swab  Result Value Ref Range Status   SARS Coronavirus 2 NEGATIVE NEGATIVE Final    Comment: (NOTE) SARS-CoV-2 target nucleic acids are NOT DETECTED.  The SARS-CoV-2 RNA is generally detectable in upper and lower respiratory specimens during the acute phase of infection. Negative results do not preclude SARS-CoV-2 infection, do not rule out co-infections with other pathogens, and should not be used as the sole basis for treatment or other patient management decisions. Negative results must be combined with clinical observations, patient history, and epidemiological information. The expected result is Negative.  Fact Sheet for Patients: SugarRoll.be  Fact Sheet for Healthcare Providers: https://www.woods-mathews.com/  This test is not yet approved or cleared by the [place] FDA and  has been authorized for detection and/or diagnosis of SARS-CoV-2 by FDA under an Emergency Use Authorization (EUA). This EUA will remain  in effect (meaning this test can be used) for the duration of the COVID-19 declaration under Se ction 564(b)(1) of the Act, 21 U.S.C. section 360bbb-3(b)(1), unless the authorization is terminated or revoked sooner.  Performed at [facility], [place] [address], [place], [place] [address]   SARS CORONAVIRUS 2 (TAT 6-24 HRS) Nasopharyngeal  Nasopharyngeal Swab     Status: None   Collection Time: 03/17/21 12:32 AM   Specimen: Nasopharyngeal Swab  Result Value Ref Range Status   SARS Coronavirus 2 NEGATIVE NEGATIVE Final    Comment: (NOTE) SARS-CoV-2 target nucleic acids are NOT DETECTED.  The SARS-CoV-2 RNA is generally detectable in upper and lower respiratory specimens during the acute phase of infection. Negative results do not preclude SARS-CoV-2 infection, do not rule out co-infections with other pathogens, and  should not be used as the sole basis for treatment or other patient management decisions. Negative results must be combined with clinical observations, patient history, and epidemiological information. The expected result is Negative.  Fact Sheet for Patients: SugarRoll.be  Fact Sheet for Healthcare Providers: https://www.woods-mathews.com/  This test is not yet approved or cleared by the [place] FDA and  has been authorized for detection and/or diagnosis of SARS-CoV-2 by FDA under an Emergency Use Authorization (EUA). This EUA will remain  in effect (meaning this test can be used) for the duration of the COVID-19 declaration under Se ction 564(b)(1) of the Act, 21 U.S.C. section 360bbb-3(b)(1), unless the authorization is terminated or revoked sooner.  Performed at [facility], [place] [address], [place], [place] [address]      Time coordinating discharge: Over 30 minutes  SIGNED:   Nolberto Hanlon, MD  [facility] 03/17/2021, 12:59 PM Pager   If 7PM-7AM, please contact night-coverage [email or web address] Password [email or web address]

## 2021-03-18 LAB — MULTIPLE MYELOMA PANEL, SERUM
Albumin SerPl Elph-Mcnc: 4 g/dL (ref 2.9–4.4)
Albumin/Glob SerPl: 1.4 (ref 0.7–1.7)
Alpha 1: 0.3 g/dL (ref 0.0–0.4)
Alpha2 Glob SerPl Elph-Mcnc: 0.9 g/dL (ref 0.4–1.0)
B-Globulin SerPl Elph-Mcnc: 0.9 g/dL (ref 0.7–1.3)
Gamma Glob SerPl Elph-Mcnc: 0.9 g/dL (ref 0.4–1.8)
Globulin, Total: 3 g/dL (ref 2.2–3.9)
IgA: 405 mg/dL — ABNORMAL HIGH (ref 87–352)
IgG (Immunoglobin G), Serum: 1495 mg/dL (ref 586–1602)
IgM (Immunoglobulin M), Srm: 82 mg/dL (ref 26–217)
Total Protein ELP: 7 g/dL (ref 6.0–8.5)

## 2021-03-20 ENCOUNTER — Telehealth: Payer: Self-pay

## 2021-03-20 DIAGNOSIS — E782 Mixed hyperlipidemia: Secondary | ICD-10-CM | POA: Diagnosis not present

## 2021-03-20 DIAGNOSIS — E039 Hypothyroidism, unspecified: Secondary | ICD-10-CM | POA: Diagnosis not present

## 2021-03-20 DIAGNOSIS — R2681 Unsteadiness on feet: Secondary | ICD-10-CM | POA: Diagnosis not present

## 2021-03-20 DIAGNOSIS — M6281 Muscle weakness (generalized): Secondary | ICD-10-CM | POA: Diagnosis not present

## 2021-03-20 DIAGNOSIS — N179 Acute kidney failure, unspecified: Secondary | ICD-10-CM | POA: Diagnosis not present

## 2021-03-20 DIAGNOSIS — D68312 Antiphospholipid antibody with hemorrhagic disorder: Secondary | ICD-10-CM | POA: Diagnosis not present

## 2021-03-20 DIAGNOSIS — I639 Cerebral infarction, unspecified: Secondary | ICD-10-CM | POA: Diagnosis not present

## 2021-03-20 DIAGNOSIS — R278 Other lack of coordination: Secondary | ICD-10-CM | POA: Diagnosis not present

## 2021-03-20 DIAGNOSIS — G9341 Metabolic encephalopathy: Secondary | ICD-10-CM | POA: Diagnosis not present

## 2021-03-20 DIAGNOSIS — I10 Essential (primary) hypertension: Secondary | ICD-10-CM | POA: Diagnosis not present

## 2021-03-20 DIAGNOSIS — M80061A Age-related osteoporosis with current pathological fracture, right lower leg, initial encounter for fracture: Secondary | ICD-10-CM | POA: Diagnosis not present

## 2021-03-20 DIAGNOSIS — N183 Chronic kidney disease, stage 3 unspecified: Secondary | ICD-10-CM | POA: Diagnosis not present

## 2021-03-20 LAB — SURGICAL PATHOLOGY

## 2021-03-20 NOTE — Telephone Encounter (Signed)
Yes ma'am- noted the above

## 2021-03-20 NOTE — Telephone Encounter (Signed)
    Spoke to patient's daughter Deidre Ala. Patient discharged to SNF - [facility] in [place]. Per [place] [facility] will not transport her to specialty appts outside of [facility] and wanted to know if Dr. Ronnald Ramp has any recommendations or if referrals would be needed for specialists specific to that area. Please contact Hilary at [phone].

## 2021-03-20 NOTE — Telephone Encounter (Signed)
I am not even sure what to do about this because she is already established with her specialists. The only thing I can say that [name] can try is- call her specialists and see if they can refer her to someone they know in [place] area for continuing care. We wouldn't do that because we are not suppose to forward other doctors' information/ notes on pt

## 2021-03-21 ENCOUNTER — Telehealth: Payer: Self-pay | Admitting: Oncology

## 2021-03-21 DIAGNOSIS — W19XXXA Unspecified fall, initial encounter: Secondary | ICD-10-CM | POA: Diagnosis not present

## 2021-03-21 DIAGNOSIS — N189 Chronic kidney disease, unspecified: Secondary | ICD-10-CM | POA: Diagnosis not present

## 2021-03-21 DIAGNOSIS — I6349 Cerebral infarction due to embolism of other cerebral artery: Secondary | ICD-10-CM | POA: Diagnosis not present

## 2021-03-21 DIAGNOSIS — E039 Hypothyroidism, unspecified: Secondary | ICD-10-CM | POA: Diagnosis not present

## 2021-03-21 NOTE — Telephone Encounter (Signed)
Pt was discharged from hospital and sent to a rehab in [place]. Her daughter is requesting a transfer of care to a facility closer to were the patient is residing. Please contact her to discuss at the number listed. She will also not be able to attend her scheduled hospital follow-up as they are not willing to transport her from [place] to our location.

## 2021-03-22 ENCOUNTER — Other Ambulatory Visit: Payer: Self-pay | Admitting: *Deleted

## 2021-03-22 ENCOUNTER — Telehealth: Payer: Self-pay

## 2021-03-22 DIAGNOSIS — E782 Mixed hyperlipidemia: Secondary | ICD-10-CM | POA: Diagnosis not present

## 2021-03-22 DIAGNOSIS — E039 Hypothyroidism, unspecified: Secondary | ICD-10-CM | POA: Diagnosis not present

## 2021-03-22 DIAGNOSIS — I639 Cerebral infarction, unspecified: Secondary | ICD-10-CM | POA: Diagnosis not present

## 2021-03-22 DIAGNOSIS — G9341 Metabolic encephalopathy: Secondary | ICD-10-CM | POA: Diagnosis not present

## 2021-03-22 DIAGNOSIS — Z719 Counseling, unspecified: Secondary | ICD-10-CM | POA: Diagnosis not present

## 2021-03-22 DIAGNOSIS — R278 Other lack of coordination: Secondary | ICD-10-CM | POA: Diagnosis not present

## 2021-03-22 DIAGNOSIS — N179 Acute kidney failure, unspecified: Secondary | ICD-10-CM | POA: Diagnosis not present

## 2021-03-22 DIAGNOSIS — B379 Candidiasis, unspecified: Secondary | ICD-10-CM | POA: Diagnosis not present

## 2021-03-22 DIAGNOSIS — M80061A Age-related osteoporosis with current pathological fracture, right lower leg, initial encounter for fracture: Secondary | ICD-10-CM | POA: Diagnosis not present

## 2021-03-22 DIAGNOSIS — N183 Chronic kidney disease, stage 3 unspecified: Secondary | ICD-10-CM | POA: Diagnosis not present

## 2021-03-22 DIAGNOSIS — I10 Essential (primary) hypertension: Secondary | ICD-10-CM | POA: Diagnosis not present

## 2021-03-22 DIAGNOSIS — D68312 Antiphospholipid antibody with hemorrhagic disorder: Secondary | ICD-10-CM | POA: Diagnosis not present

## 2021-03-22 DIAGNOSIS — M6281 Muscle weakness (generalized): Secondary | ICD-10-CM | POA: Diagnosis not present

## 2021-03-22 DIAGNOSIS — R2681 Unsteadiness on feet: Secondary | ICD-10-CM | POA: Diagnosis not present

## 2021-03-22 DIAGNOSIS — M84471D Pathological fracture, right ankle, subsequent encounter for fracture with routine healing: Secondary | ICD-10-CM | POA: Diagnosis not present

## 2021-03-22 DIAGNOSIS — D6861 Antiphospholipid syndrome: Secondary | ICD-10-CM

## 2021-03-22 LAB — VITAMIN B6: Vitamin B6: 5.6 ug/L (ref 3.4–65.2)

## 2021-03-22 NOTE — Telephone Encounter (Signed)
This pt was seen in the hospital for antiphospholipid antibody syndrome and anemia. She had a bone marrow biopsy and I was supposed to see ehr outpatient. We can refer her to [facility] hematology but let patients daughter know it would likely be a while before they give her the appt

## 2021-03-22 NOTE — Telephone Encounter (Signed)
Copied from Daisy [id number]. Topic: General - Other >> Mar 22, 2021 11:00 AM Jodie Echevaria wrote: Reason for CRM: [facility]  patient daughter called in to inquire of Dr Ronnald Ramp nurse about referrals that were to be placed for  Neurology, Hematology, Orthopedic and General Surgery say she have not heard anything and would like a call back with an update please  Ph# [phone]

## 2021-03-22 NOTE — Telephone Encounter (Signed)
Called daughter and let her know that I have faxed info from dr Janese Banks while in hospital and admission info and d/c info from hospitalization and sent labs and Bone marrow bx. Faxed it to [facility]. Wanted to let her know that it is usually a wait to get appt. Sometimes 3 or more months. Daughter would like to see if they can keep appt for Friday but to do video visit and she will have her laptop and go into the facility where her mother is and do video and then the referral will be in place and she can check on when they can get [facility] appt. I told her that I will speak to Janese Banks and let her know tom. Email: [email or web address]

## 2021-03-23 NOTE — Telephone Encounter (Signed)
[name] spoke to [name]

## 2021-03-23 NOTE — Telephone Encounter (Signed)
Yes if patient can be there we can change visit to video

## 2021-03-24 ENCOUNTER — Encounter: Payer: Self-pay | Admitting: Oncology

## 2021-03-24 ENCOUNTER — Inpatient Hospital Stay: Payer: Medicare Other | Attending: Oncology | Admitting: Oncology

## 2021-03-24 ENCOUNTER — Encounter (HOSPITAL_COMMUNITY): Payer: Self-pay | Admitting: Oncology

## 2021-03-24 ENCOUNTER — Telehealth: Payer: Self-pay

## 2021-03-24 DIAGNOSIS — R2681 Unsteadiness on feet: Secondary | ICD-10-CM | POA: Diagnosis not present

## 2021-03-24 DIAGNOSIS — I10 Essential (primary) hypertension: Secondary | ICD-10-CM | POA: Diagnosis not present

## 2021-03-24 DIAGNOSIS — R278 Other lack of coordination: Secondary | ICD-10-CM | POA: Diagnosis not present

## 2021-03-24 DIAGNOSIS — I639 Cerebral infarction, unspecified: Secondary | ICD-10-CM | POA: Diagnosis not present

## 2021-03-24 DIAGNOSIS — N179 Acute kidney failure, unspecified: Secondary | ICD-10-CM | POA: Diagnosis not present

## 2021-03-24 DIAGNOSIS — D68312 Antiphospholipid antibody with hemorrhagic disorder: Secondary | ICD-10-CM | POA: Diagnosis not present

## 2021-03-24 DIAGNOSIS — G9341 Metabolic encephalopathy: Secondary | ICD-10-CM | POA: Diagnosis not present

## 2021-03-24 DIAGNOSIS — M80061A Age-related osteoporosis with current pathological fracture, right lower leg, initial encounter for fracture: Secondary | ICD-10-CM | POA: Diagnosis not present

## 2021-03-24 DIAGNOSIS — D649 Anemia, unspecified: Secondary | ICD-10-CM | POA: Diagnosis not present

## 2021-03-24 DIAGNOSIS — I6349 Cerebral infarction due to embolism of other cerebral artery: Secondary | ICD-10-CM | POA: Diagnosis not present

## 2021-03-24 DIAGNOSIS — M84471D Pathological fracture, right ankle, subsequent encounter for fracture with routine healing: Secondary | ICD-10-CM | POA: Diagnosis not present

## 2021-03-24 DIAGNOSIS — M6281 Muscle weakness (generalized): Secondary | ICD-10-CM | POA: Diagnosis not present

## 2021-03-24 DIAGNOSIS — E039 Hypothyroidism, unspecified: Secondary | ICD-10-CM | POA: Diagnosis not present

## 2021-03-24 DIAGNOSIS — N183 Chronic kidney disease, stage 3 unspecified: Secondary | ICD-10-CM | POA: Diagnosis not present

## 2021-03-24 DIAGNOSIS — E782 Mixed hyperlipidemia: Secondary | ICD-10-CM | POA: Diagnosis not present

## 2021-03-24 NOTE — Telephone Encounter (Signed)
Ultrasound scheduled 04/19/21 @ 9:15 am. Spoke with patients daughter and provided appointment  Information.

## 2021-03-26 ENCOUNTER — Encounter: Payer: Self-pay | Admitting: Oncology

## 2021-03-26 NOTE — Progress Notes (Signed)
I connected with [name] on 03/26/21 at  9:30 AM EDT by video enabled telemedicine visit and verified that I am speaking with the correct person using two identifiers.   I discussed the limitations, risks, security and privacy concerns of performing an evaluation and management service by telemedicine and the availability of in-person appointments. I also discussed with the patient that there may be a patient responsible charge related to this service. The patient expressed understanding and agreed to proceed.  Other persons participating in the visit and their role in the encounter:  Patients daughter  Patient's location:  Rehab facility Provider's location:  work  Risk analyst Complaint:  Discuss bone marrow results and further management  History of present illness: patient is a 70 year old female who was diagnosed with antiphospholipid antibody syndromeAt [facility] back in 2004.  At that time she had presented with symptoms of stroke.  I do not have APS levels back from 2004 but repeat levels done in 2020 showed persistently elevated IgG beta-2 glycoprotein and IgG anticardiolipin antibody with titers greater than 40.  She was on Coumadin since then up until January 2022 when she was again diagnosed with multiple strokes.  There was a concern prior to that the patient was not checking her INR consistently and may not have been therapeutic.  She was switched to Eliquis and discharged to rehab from [facility] in February 2022.  She was admitted to [facility] with symptoms of confusion and was again noted to have shower of embolic pattern acute infarcts involving the infra and supratentorial brain with extensive chronic ischemic injury.     A year ago patient's hemoglobin was around 11.  Since the beginning of this year her hemoglobin is gradually drifted down from 10-7.3 presently.  Also since January 2022 she has had new onset thrombocytopenia and her platelet counts have been fluctuating between 60s 200s.   A year ago patient had a normal platelet count in the 200s.  Patient has had chronic kidney disease at least dating back to 2018 and her CKD seems no worse now than what it was 3 years ago.  Patient had iron studies checked during this hospitalization which showed an elevated ferritin of 744.  Iron studies showed a low TIBC of 245.  B12 levels were elevated at 4012.  TSH mildly abnormal at 4.8.Results of anemia work-up including B6 level, myeloma panel, serum free light chains, copper, folate, haptoglobin were normal.  Mildly elevated double-stranded anti-DNA antibody  Patient also had a bone marrow biopsy while she was admitted to the hospital which showed normocellular marrow with trilineage hematopoiesis and no evidence of Lymphoma leukemia..  No evidence of dysplasia or increase in blasts  Interval history patient is currently at a rehab facility in [place].  Her daughter was with her at the time of video visit today.  She is slowly recuperating at the facility   Review of Systems  Constitutional: Positive for malaise/fatigue. Negative for chills, fever and weight loss.  HENT: Negative for congestion, ear discharge and nosebleeds.   Eyes: Negative for blurred vision.  Respiratory: Negative for cough, hemoptysis, sputum production, shortness of breath and wheezing.   Cardiovascular: Negative for chest pain, palpitations, orthopnea and claudication.  Gastrointestinal: Negative for abdominal pain, blood in stool, constipation, diarrhea, heartburn, melena, nausea and vomiting.  Genitourinary: Negative for dysuria, flank pain, frequency, hematuria and urgency.  Musculoskeletal: Negative for back pain, joint pain and myalgias.  Skin: Negative for rash.  Neurological: Negative for dizziness, tingling, focal weakness,  seizures, weakness and headaches.  Endo/Heme/Allergies: Does not bruise/bleed easily.  Psychiatric/Behavioral: Negative for depression and suicidal ideas. The patient does not have  insomnia.     Allergies  Allergen Reactions  . Penicillin G Rash    Past Medical History:  Diagnosis Date  . Anxiety, generalized 10/31/2020  . Arthritis 04/02/1996  . Depression   . GERD (gastroesophageal reflux disease) 04/03/2019  . Hyperlipidemia   . Hypertension   . Renal insufficiency   . Stroke ([place])   . Thyroid disease     Past Surgical History:  Procedure Laterality Date  . CESAREAN SECTION    . CHOLECYSTECTOMY    . COLONOSCOPY  2012   repeat in 10 yrs  . KNEE ARTHROSCOPY W/ MENISCAL REPAIR Left     Social History   Socioeconomic History  . Marital status: Married    Spouse name: Not on file  . Number of children: 1  . Years of education: Not on file  . Highest education level: Master's degree (e.g., MA, MS, MEng, MEd, MSW, MBA)  Occupational History  . Occupation: Retired  Tobacco Use  . Smoking status: Never Smoker  . Smokeless tobacco: Never Used  . Tobacco comment: none  Vaping Use  . Vaping Use: Never used  Substance and Sexual Activity  . Alcohol use: Yes    Alcohol/week: 1.0 standard drink    Types: 1 Glasses of wine per week    Comment: occasional drink  . Drug use: No  . Sexual activity: Not Currently    Birth control/protection: Post-menopausal  Other Topics Concern  . Not on file  Social History Narrative  . Not on file   Social Determinants of Health   Financial Resource Strain: Not on file  Food Insecurity: Not on file  Transportation Needs: Not on file  Physical Activity: Not on file  Stress: Not on file  Social Connections: Not on file  Intimate Partner Violence: Not on file    Family History  Problem Relation Age of Onset  . Stroke Father   . Breast cancer Maternal Grandmother   . Kidney cancer Mother   . Cancer Mother   . Arthritis Brother      Current Outpatient Medications:  .  ascorbic acid (VITAMIN C) 500 MG tablet, Take 500 mg by mouth 2 (two) times daily., Disp: , Rfl:  .  atorvastatin (LIPITOR) 20 MG  tablet, Take 1 tablet (20 mg total) by mouth daily. (Patient taking differently: Take 20 mg by mouth every evening.), Disp: , Rfl:  .  buPROPion (WELLBUTRIN XL) 150 MG 24 hr tablet, Take 1 tablet (150 mg total) by mouth daily., Disp: 30 tablet, Rfl: 0 .  diclofenac Sodium (VOLTAREN) 1 % GEL, Apply 2 g topically 4 (four) times daily as needed (left knee pain)., Disp: , Rfl:  .  escitalopram (LEXAPRO) 20 MG tablet, Take 1 tablet (20 mg total) by mouth daily., Disp: 90 tablet, Rfl: 0 .  famotidine (PEPCID) 20 MG tablet, Take 20 mg by mouth daily., Disp: , Rfl:  .  ferrous sulfate 325 (65 FE) MG EC tablet, Take 1 tablet (325 mg total) by mouth daily with breakfast. (Patient taking differently: Take 325 mg by mouth at bedtime.), Disp: 30 tablet, Rfl: 3 .  levothyroxine (SYNTHROID) 100 MCG tablet, Take 1 tablet (100 mcg total) by mouth daily at 6 (six) AM., Disp: , Rfl:  .  lidocaine (LIDODERM) 5 %, Place 1 patch onto the skin daily. Remove & Discard patch within  12 hours or as directed by MD, Disp: 30 patch, Rfl: 0 .  montelukast (SINGULAIR) 10 MG tablet, TAKE 1 TABLET BY MOUTH EVERYDAY AT BEDTIME (Patient taking differently: Take 10 mg by mouth at bedtime. TAKE 1 TABLET BY MOUTH EVERYDAY AT BEDTIME), Disp: 90 tablet, Rfl: 3 .  Multiple Vitamin (MULTIVITAMIN WITH MINERALS) TABS tablet, Take 1 tablet by mouth daily., Disp: , Rfl:  .  oxybutynin (DITROPAN XL) 15 MG 24 hr tablet, Take 1 tablet (15 mg total) by mouth at bedtime., Disp: , Rfl:  .  traZODone (DESYREL) 100 MG tablet, Take 1 tablet (100 mg total) by mouth at bedtime., Disp: , Rfl:  .  vitamin B-12 (CYANOCOBALAMIN) 1000 MCG tablet, Take 1,000 mcg by mouth daily., Disp: , Rfl:  .  warfarin (COUMADIN) 3 MG tablet, Take 1 tablet (3 mg total) by mouth one time only at 4 PM., Disp: , Rfl:  .  feeding supplement (ENSURE ENLIVE / ENSURE PLUS) LIQD, Take 237 mLs by mouth 3 (three) times daily between meals. (Patient not taking: Reported on 03/24/2021), Disp:  237 mL, Rfl: 12 .  HYDROcodone-acetaminophen (NORCO/VICODIN) 5-325 MG tablet, hydrocodone 5 mg-acetaminophen 325 mg tablet  Take 1 tablet every 6 hours by oral route., Disp: , Rfl:  .  meloxicam (MOBIC) 15 MG tablet, meloxicam 15 mg tablet  Take 1 tablet every day by oral route., Disp: , Rfl:  .  neomycin-bacitracin-polymyxin (NEOSPORIN) OINT, Apply 1 application topically 2 (two) times daily. (Patient not taking: No sig reported), Disp: , Rfl:  .  ondansetron (ZOFRAN-ODT) 4 MG disintegrating tablet, Take 4 mg by mouth every 6 (six) hours as needed for nausea or vomiting. (Patient not taking: Reported on 03/24/2021), Disp: , Rfl:  .  polyethylene glycol (MIRALAX / GLYCOLAX) 17 g packet, Take 17 g by mouth 2 (two) times daily. (Patient not taking: Reported on 03/24/2021), Disp: 14 each, Rfl: 0 .  senna-docusate (SENOKOT-S) 8.6-50 MG tablet, Take 2 tablets by mouth 2 (two) times daily. (Patient not taking: Reported on 03/24/2021), Disp: , Rfl:  .  sodium chloride (OCEAN) 0.65 % SOLN nasal spray, Place 2 sprays into both nostrils every 6 (six) hours. (Patient not taking: Reported on 03/24/2021), Disp: , Rfl:  .  sodium phosphate (FLEET) 7-19 GM/118ML ENEM, Place 133 mLs (1 enema total) rectally daily as needed for severe constipation. (Patient not taking: Reported on 03/24/2021), Disp: , Rfl: 0 .  solifenacin (VESICARE) 5 MG tablet, solifenacin 5 mg tablet  TAKE 1 TABLET BY MOUTH EVERY DAY, Disp: , Rfl:   CT ABDOMEN PELVIS WO CONTRAST  Result Date: 03/14/2021 CLINICAL DATA:  Left upper quadrant abdominal pain. EXAM: CT ABDOMEN AND PELVIS WITHOUT CONTRAST TECHNIQUE: Multidetector CT imaging of the abdomen and pelvis was performed following the standard protocol without IV contrast. COMPARISON:  Ultrasound of same day. CT of March 11, 2021 and January 09, 2021. FINDINGS: Lower chest: Small left pleural effusion is noted with minimal adjacent subsegmental atelectasis. Hepatobiliary: No focal liver abnormality is  seen. Status post cholecystectomy. No biliary dilatation. Pancreas: Unremarkable. No pancreatic ductal dilatation or surrounding inflammatory changes. Spleen: Stable size and appearance of oval-shaped low density involving inferior and anterior portion of the spleen most consistent with subcapsular hematoma. Infarction is felt to be less likely. Adrenals/Urinary Tract: 2 cm right adrenal nodule is noted which is unchanged compared to prior exams. Left adrenal gland is unremarkable. Bilateral renal cortical thinning and scarring is noted. No hydronephrosis or renal obstruction is noted. No renal  or ureteral calculi are noted. Urinary bladder is unremarkable. Stomach/Bowel: The stomach and appendix appear normal. There is no evidence of bowel obstruction. No inflammation is noted. Large amount of stool is noted in the distal sigmoid colon and rectum concerning for impaction. Vascular/Lymphatic: Aortic atherosclerosis. No enlarged abdominal or pelvic lymph nodes. Reproductive: Uterus and bilateral adnexa are unremarkable. Other: No abdominal wall hernia or abnormality. No abdominopelvic ascites. Musculoskeletal: No acute or significant osseous findings. IMPRESSION: Grossly stable size and appearance of oval-shaped low density involving inferior anterior portion of the spleen most consistent with subcapsular hematoma. Infarction is felt to be less likely. As recommended by ultrasound exam today, follow-up ultrasound in several weeks is recommended to ensure stability or resolution of this abnormality. These results were called by telephone at the time of interpretation on 03/14/2021 at 6:17 pm to provider [facility] , who verbally acknowledged these results. 2 cm right adrenal nodule is noted. Follow-up CT or MRI in 12 months is recommended to rule out neoplasm. Large amount of stool is noted in the distal sigmoid colon and rectum concerning for impaction. Aortic Atherosclerosis (ICD10-I70.0). Electronically Signed    By: Marijo Conception M.D.   On: 03/14/2021 18:17   DG Chest 2 View  Result Date: 03/11/2021 CLINICAL DATA:  Confusion EXAM: CHEST - 2 VIEW COMPARISON:  January 08, 2021 FINDINGS: Interstitium is mildly thickened. No edema or airspace opacity. Heart size and pulmonary vascularity are normal. No adenopathy. No bone lesions. Surgical clips noted in gallbladder fossa region. IMPRESSION: Interstitial thickening, likely indicative of underlying chronic bronchitis. No edema or airspace opacity. Cardiac silhouette normal. Electronically Signed   By: Lowella Grip III M.D.   On: 03/11/2021 09:56   CT Head Wo Contrast  Result Date: 03/11/2021 CLINICAL DATA:  Altered mental status/confusion EXAM: CT HEAD WITHOUT CONTRAST TECHNIQUE: Contiguous axial images were obtained from the base of the skull through the vertex without intravenous contrast. COMPARISON:  February 22, 2021 FINDINGS: Brain: Moderate diffuse atrophy is stable. There is no intracranial mass, hemorrhage, extra-axial fluid collection, or midline shift. Decreased attenuation is noted with encephalomalacia in the medial right occipital lobe consistent with prior infarct. Prior infarct also noted in the posterosuperior right parietal lobe, stable. A prior infarct is noted in the posterior aspect of the mid left cerebellum, stable. There is mild decreased attenuation in portions of the centra semiovale bilaterally. No acute appearing infarct is evident. Vascular: There is no hyperdense vessel. There is calcification in each carotid siphon region. Skull: The bony calvarium appears intact. Sinuses/Orbits: There is slight mucosal thickening in several ethmoid air cells. Visualized orbits appear symmetric bilaterally. Other: Mastoid air cells are clear. IMPRESSION: Stable atrophy. Prior infarcts in the medial right occipital lobe, superior posterior right parietal lobe, and posterior mid left cerebellum. Decreased attenuation in the periventricular white matter is  likely due to small vessel vascular disease, although a mild degree of interstitial edema secondary to the ventricular enlargement could present in this manner. No acute infarct evident. No mass or hemorrhage. There are foci of arterial vascular calcification. There is slight mucosal thickening in several ethmoid air cells. Electronically Signed   By: Lowella Grip III M.D.   On: 03/11/2021 10:00   MR BRAIN WO CONTRAST  Result Date: 03/12/2021 CLINICAL DATA:  Confusion. Recent history of stroke. Hypercoagulable. EXAM: MRI HEAD WITHOUT CONTRAST TECHNIQUE: Multiplanar, multiecho pulse sequences of the brain and surrounding structures were obtained without intravenous contrast. COMPARISON:  12/26/2020 FINDINGS: Brain: Innumerable subcentimeter mainly  cortically based acute infarcts along the bilateral cerebral convexity with predilection for the watershed regions. Similar size infarcts are seen in the bilateral cerebellum. Numerous pre-existing cerebellar and cortical infarcts with generalized brain atrophy. Large remote right occipital infarct with dense encephalomalacia. No acute hemorrhage, hydrocephalus, or mass. Vascular: Major flow voids are preserved Skull and upper cervical spine: Normal marrow signal Sinuses/Orbits: Negative IMPRESSION: 1. Shower of embolic pattern acute infarcts involving the infra and supratentorial brain. 2. Extensive chronic ischemic injury. Electronically Signed   By: Monte Fantasia M.D.   On: 03/12/2021 11:52   CT BONE MARROW BIOPSY & ASPIRATION  Result Date: 03/16/2021 INDICATION: Anemia Thrombocytopenia EXAM: CT GUIDED BONE MARROW ASPIRATES AND BIOPSY MEDICATIONS: None. ANESTHESIA/SEDATION: Fentanyl 100 mcg IV; Versed 2 mg IV Moderate Sedation Time:  12 minutes The patient was continuously monitored during the procedure by the interventional radiology nurse under my direct supervision. COMPLICATIONS: None immediate. PROCEDURE: The procedure was explained to the patient.  The risks and benefits of the procedure were discussed and the patient's questions were addressed. Informed consent was obtained from the patient. The patient was placed prone on CT table. Images of the pelvis were obtained. The right side of back was prepped and draped in sterile fashion. The skin and right posterior ilium were anesthetized with 1% lidocaine. 11 gauge bone needle was directed into the right ilium with CT guidance. Two aspirates and 1 core biopsy were obtained. Bandage placed over the puncture site. IMPRESSION: CT guided bone marrow aspiration and core biopsy. Electronically Signed   By: Miachel Roux M.D.   On: 03/16/2021 14:01   ECHOCARDIOGRAM COMPLETE  Result Date: 03/14/2021    ECHOCARDIOGRAM REPORT   Patient Name:   SUDA FORBESS Date of Exam: 03/14/2021 Medical Rec #:  [id number]          Height:       68.0 in Accession #:    [id number]         Weight:       184.0 lb Date of Birth:  Nov 04, 1951         BSA:          1.973 m Patient Age:    63 years           BP:           147/76 mmHg Patient Gender: F                  HR:           101 bpm. Exam Location:  [facility] Procedure: 2D Echo, Cardiac Doppler and Color Doppler Indications:     Stroke I63.9  History:         Patient has prior history of Echocardiogram examinations, most                  recent 12/27/2020. Stroke; Risk Factors:Hypertension.  Sonographer:     Sherrie Sport RDCS (AE) Referring Phys:  [id number] Barb Merino Diagnosing Phys: Isaias Cowman MD IMPRESSIONS  1. Left ventricular ejection fraction, by estimation, is 50 to 55%. The left ventricle has low normal function. The left ventricle has no regional wall motion abnormalities. Left ventricular diastolic parameters are consistent with Grade II diastolic dysfunction (pseudonormalization).  2. Right ventricular systolic function is normal. The right ventricular size is normal.  3. The mitral valve is normal in structure. Mild to moderate mitral valve regurgitation. No evidence  of mitral stenosis.  4. The aortic valve is normal in structure.  Aortic valve regurgitation is mild. No aortic stenosis is present.  5. The inferior vena cava is normal in size with greater than 50% respiratory variability, suggesting right atrial pressure of 3 mmHg. FINDINGS  Left Ventricle: Left ventricular ejection fraction, by estimation, is 50 to 55%. The left ventricle has low normal function. The left ventricle has no regional wall motion abnormalities. The left ventricular internal cavity size was normal in size. There is no left ventricular hypertrophy. Left ventricular diastolic parameters are consistent with Grade II diastolic dysfunction (pseudonormalization). Right Ventricle: The right ventricular size is normal. No increase in right ventricular wall thickness. Right ventricular systolic function is normal. Left Atrium: Left atrial size was normal in size. Right Atrium: Right atrial size was normal in size. Pericardium: There is no evidence of pericardial effusion. Mitral Valve: The mitral valve is normal in structure. Mild to moderate mitral valve regurgitation. No evidence of mitral valve stenosis. Tricuspid Valve: The tricuspid valve is normal in structure. Tricuspid valve regurgitation is mild . No evidence of tricuspid stenosis. Aortic Valve: The aortic valve is normal in structure. Aortic valve regurgitation is mild. No aortic stenosis is present. Aortic valve mean gradient measures 6.0 mmHg. Aortic valve peak gradient measures 10.0 mmHg. Aortic valve area, by VTI measures 1.78  cm. Pulmonic Valve: The pulmonic valve was normal in structure. Pulmonic valve regurgitation is not visualized. No evidence of pulmonic stenosis. Aorta: The aortic root is normal in size and structure. Venous: The inferior vena cava is normal in size with greater than 50% respiratory variability, suggesting right atrial pressure of 3 mmHg. IAS/Shunts: No atrial level shunt detected by color flow Doppler.  LEFT VENTRICLE  PLAX 2D LVIDd:         4.40 cm  Diastology LVIDs:         3.26 cm  LV e' medial:    6.64 cm/s LV PW:         0.98 cm  LV E/e' medial:  10.3 LV IVS:        0.93 cm  LV e' lateral:   5.00 cm/s LVOT diam:     2.00 cm  LV E/e' lateral: 13.7 LV SV:         60 LV SV Index:   30 LVOT Area:     3.14 cm  RIGHT VENTRICLE RV Basal diam:  2.97 cm RV S prime:     9.57 cm/s TAPSE (M-mode): 3.1 cm LEFT ATRIUM             Index       RIGHT ATRIUM           Index LA diam:        4.00 cm 2.03 cm/m  RA Area:     13.90 cm LA Vol (A2C):   60.5 ml 30.67 ml/m RA Volume:   31.30 ml  15.87 ml/m LA Vol (A4C):   62.6 ml 31.73 ml/m LA Biplane Vol: 66.5 ml 33.71 ml/m  AORTIC VALVE                    PULMONIC VALVE AV Area (Vmax):    1.41 cm     PV Vmax:        0.75 m/s AV Area (Vmean):   1.29 cm     PV Peak grad:   2.2 mmHg AV Area (VTI):     1.78 cm     RVOT Peak grad: 2 mmHg AV Vmax:  158.33 cm/s AV Vmean:          116.000 cm/s AV VTI:            0.337 m AV Peak Grad:      10.0 mmHg AV Mean Grad:      6.0 mmHg LVOT Vmax:         71.10 cm/s LVOT Vmean:        47.600 cm/s LVOT VTI:          0.191 m LVOT/AV VTI ratio: 0.57  AORTA Ao Root diam: 2.83 cm MITRAL VALVE                TRICUSPID VALVE MV Area (PHT): 3.60 cm     TR Peak grad:   33.2 mmHg MV Decel Time: 211 msec     TR Vmax:        288.00 cm/s MV E velocity: 68.60 cm/s MV A velocity: 117.00 cm/s  SHUNTS MV E/A ratio:  0.59         Systemic VTI:  0.19 m                             Systemic Diam: 2.00 cm Isaias Cowman MD Electronically signed by Isaias Cowman MD Signature Date/Time: 03/14/2021/1:55:06 PM    Final    CT Renal Stone Study  Result Date: 03/11/2021 CLINICAL DATA:  70 year old with chronic kidney disease, presenting with a urinary tract infection. EXAM: CT ABDOMEN AND PELVIS WITHOUT CONTRAST TECHNIQUE: Multidetector CT imaging of the abdomen and pelvis was performed following the standard protocol without IV contrast. COMPARISON:  01/09/2021.  FINDINGS: Lower chest: LEFT pleural effusion and associated mild passive atelectasis in th, broad-based central disc protrusion and multifactorial spinal stenosis at L4-5, and diffuse degenerative changes e LEFT LOWER LOBE. Visualized lung bases otherwise clear. Heart moderately enlarged. Small pericardial effusion versus pericardial thickening. Hepatobiliary: Normal unenhanced appearance of the liver. Surgically absent gallbladder. No unexpected biliary ductal dilation. Pancreas: Normal unenhanced appearance. Spleen: Subtle low attenuation involving the lower pole of the spleen, not present on the prior CT, with associated edema in the perisplenic fat. Adrenals/Urinary Tract: Normal appearing adrenal glands. Scarring and mild cortical thinning involving both kidneys. Allowing for the unenhanced technique, no significant focal parenchymal abnormalities involving either kidney. No hydronephrosis. No urinary tract calculi. Normal appearing mildly distended urinary bladder. Stomach/Bowel: Stomach normal in appearance for the degree of distention. Normal-appearing small bowel. Mobile cecum positioned in the RIGHT UPPER QUADRANT. Large rectal colonic stool burden and moderate colonic stool burden elsewhere. High attenuation ingested material within the colon and in the normal appearing appendix located in the RIGHT mid abdomen. No focal colonic abnormality. Vascular/Lymphatic: Mild atherosclerosis involving the abdominal aorta without evidence of aneurysm. No pathologic lymphadenopathy. Reproductive: Normal-appearing uterus and ovaries without evidence of adnexal mass. Other: Mild edema in the abdominal wall overlying both flanks, right greater than left. Musculoskeletal: Degenerative disc disease and spondylosis at L5-S1, broad-based central disc protrusion and multifactorial spinal stenosis at L4-5 and diffuse facet degenerative changes throughout the lumbar spine. No acute findings. IMPRESSION: 1. Subtle low  attenuation involving the lower pole of the spleen, not present on the prior CT, with associated edema in the perisplenic fat, possibly indicating splenic infarct or splenic abscess. CT abdomen with contrast may be confirmatory. If the patient's renal function does not allow intravenous contrast administration, then ultrasound may be helpful in further evaluation. 2. No acute abnormalities otherwise involving  the abdomen or pelvis. 3. LEFT pleural effusion and associated mild passive atelectasis in the LEFT LOWER LOBE. 4. Small pericardial effusion versus pericardial thickening. 5. Aortic Atherosclerosis (ICD10-I70.0). Electronically Signed   By: Evangeline Dakin M.D.   On: 03/11/2021 12:12   US ABDOMINAL PELVIC ART/VENT FLOW DOPPLER LIMITED  Result Date: 03/14/2021 CLINICAL DATA:  Evaluate splenic abnormality on recent CT. EXAM: ULTRASOUND ABDOMEN LIMITED WITH DOPPLER TECHNIQUE: Images were obtained at the area of concern at the spleen. COMPARISON:  CT of the abdomen and pelvis 03/11/2021 FINDINGS: Spleen measures 11.8 x 5.3 x 12.7 cm. Calculated splenic volume is 397 mL. Superior and mid portions of the spleen have normal echotexture. Along the inferior aspect of the spleen there is fluid surrounding a solid structure which has similar echogenicity to the spleen. Suspect this is fluid collection around a small area of abnormal splenic tissue or fluid surrounding a splenic hematoma. These findings correspond with the recent CT findings. This area of concern with surrounding fluid measures 4.1 x 4.0 x 4.5 cm. Arterial and venous flow at the splenic hilum. No significant internal vascularity within the tissue surrounded by fluid. IMPRESSION: Complex fluid collection along the inferior aspect of spleen corresponding with the abnormality on the recent CT. The echogenic material surrounded by fluid could represent abnormal splenic tissue versus hematoma. The fluid collection and CT findings are more suggestive for a  hematoma due to a splenic laceration rather than an infarct or abscess. Consider a follow-up ultrasound in 6-8 weeks to see if the fluid collection resolves. Electronically Signed   By: Markus Daft M.D.   On: 03/14/2021 13:23   US SPLEEN (ABDOMEN LIMITED)  Result Date: 03/14/2021 CLINICAL DATA:  Evaluate splenic abnormality on recent CT. EXAM: ULTRASOUND ABDOMEN LIMITED WITH DOPPLER TECHNIQUE: Images were obtained at the area of concern at the spleen. COMPARISON:  CT of the abdomen and pelvis 03/11/2021 FINDINGS: Spleen measures 11.8 x 5.3 x 12.7 cm. Calculated splenic volume is 397 mL. Superior and mid portions of the spleen have normal echotexture. Along the inferior aspect of the spleen there is fluid surrounding a solid structure which has similar echogenicity to the spleen. Suspect this is fluid collection around a small area of abnormal splenic tissue or fluid surrounding a splenic hematoma. These findings correspond with the recent CT findings. This area of concern with surrounding fluid measures 4.1 x 4.0 x 4.5 cm. Arterial and venous flow at the splenic hilum. No significant internal vascularity within the tissue surrounded by fluid. IMPRESSION: Complex fluid collection along the inferior aspect of spleen corresponding with the abnormality on the recent CT. The echogenic material surrounded by fluid could represent abnormal splenic tissue versus hematoma. The fluid collection and CT findings are more suggestive for a hematoma due to a splenic laceration rather than an infarct or abscess. Consider a follow-up ultrasound in 6-8 weeks to see if the fluid collection resolves. Electronically Signed   By: Markus Daft M.D.   On: 03/14/2021 13:23      CMP Latest Ref Rng & Units 03/15/2021  Glucose 70 - 99 mg/dL 109(H)  BUN 8 - 23 mg/dL 20  Creatinine 0.44 - 1.00 mg/dL 1.82(H)  Sodium 135 - 145 mmol/L 135  Potassium 3.5 - 5.1 mmol/L 3.5  Chloride 98 - 111 mmol/L 101  CO2 22 - 32 mmol/L 24  Calcium 8.9  - 10.3 mg/dL 9.8  Total Protein 6.5 - 8.1 g/dL -  Total Bilirubin 0.3 - 1.2 mg/dL -  Alkaline  Phos 38 - 126 U/L -  AST 15 - 41 U/L -  ALT 0 - 44 U/L -   CBC Latest Ref Rng & Units 03/17/2021  WBC 4.0 - 10.5 K/uL 8.3  Hemoglobin 12.0 - 15.0 g/dL 7.9(L)  Hematocrit 36.0 - 46.0 % 24.9(L)  Platelets 150 - 400 K/uL 186     Observation/objective: Appears in no acute distress over video visit today.  Breathing is nonlabored  Assessment and plan: Patient is a 70 year old female with normocytic anemia likely multifactorial secondary to anemia of chronic disease and anemia of chronic kidney disease.  This is a visit to discuss bone marrow results and further  Discussed the results of anemia work-up which does not reveal any reversible cause of anemia.  Iron levels and B12 levels are adequate.  As such patient can stop taking her iron and B12 supplements at this time.  Also bone marrow biopsy did not reveal any evidence of primary Bone marrow disorder that would explain her anemia.  Suspect her anemia is multifactorial secondary to chronic disease as well as possibly chronic kidney disease.  Given her recent episodes of multiple strokes I would hesitate to start her on EPO.  I would favor monitoring her hemoglobin once a month.  Patient is unable to come to [place] for subsequent visits and blood draws.  She is at a rehab facility in [place].  We have therefore referred her to [facility] hematology for subsequent follow-up.  While she is at the rehab I will have her CBC checked monthly.  Patient's family verbalized understanding  Follow-up instructions: As above  I discussed the assessment and treatment plan with the patient. The patient was provided an opportunity to ask questions and all were answered. The patient agreed with the plan and demonstrated an understanding of the instructions.   The patient was advised to call back or seek an in-person evaluation if the symptoms worsen or if the condition fails  to improve as anticipated.   Visit Diagnosis: 1. Normocytic anemia     Dr. Randa Evens, MD, MPH [facility] at [facility] Tel- [phone] 03/26/2021 10:58 AM

## 2021-03-27 DIAGNOSIS — M6281 Muscle weakness (generalized): Secondary | ICD-10-CM | POA: Diagnosis not present

## 2021-03-27 DIAGNOSIS — G9341 Metabolic encephalopathy: Secondary | ICD-10-CM | POA: Diagnosis not present

## 2021-03-27 DIAGNOSIS — N183 Chronic kidney disease, stage 3 unspecified: Secondary | ICD-10-CM | POA: Diagnosis not present

## 2021-03-27 DIAGNOSIS — N179 Acute kidney failure, unspecified: Secondary | ICD-10-CM | POA: Diagnosis not present

## 2021-03-27 DIAGNOSIS — E782 Mixed hyperlipidemia: Secondary | ICD-10-CM | POA: Diagnosis not present

## 2021-03-27 DIAGNOSIS — D68312 Antiphospholipid antibody with hemorrhagic disorder: Secondary | ICD-10-CM | POA: Diagnosis not present

## 2021-03-27 DIAGNOSIS — M84471D Pathological fracture, right ankle, subsequent encounter for fracture with routine healing: Secondary | ICD-10-CM | POA: Diagnosis not present

## 2021-03-27 DIAGNOSIS — M80061A Age-related osteoporosis with current pathological fracture, right lower leg, initial encounter for fracture: Secondary | ICD-10-CM | POA: Diagnosis not present

## 2021-03-27 DIAGNOSIS — R278 Other lack of coordination: Secondary | ICD-10-CM | POA: Diagnosis not present

## 2021-03-27 DIAGNOSIS — I6349 Cerebral infarction due to embolism of other cerebral artery: Secondary | ICD-10-CM | POA: Diagnosis not present

## 2021-03-27 DIAGNOSIS — N390 Urinary tract infection, site not specified: Secondary | ICD-10-CM | POA: Diagnosis not present

## 2021-03-27 DIAGNOSIS — E039 Hypothyroidism, unspecified: Secondary | ICD-10-CM | POA: Diagnosis not present

## 2021-03-27 DIAGNOSIS — I10 Essential (primary) hypertension: Secondary | ICD-10-CM | POA: Diagnosis not present

## 2021-03-27 DIAGNOSIS — R2681 Unsteadiness on feet: Secondary | ICD-10-CM | POA: Diagnosis not present

## 2021-03-27 DIAGNOSIS — I639 Cerebral infarction, unspecified: Secondary | ICD-10-CM | POA: Diagnosis not present

## 2021-03-27 LAB — VITAMIN B1: Vitamin B1 (Thiamine): 118.7 nmol/L (ref 66.5–200.0)

## 2021-03-27 LAB — SURGICAL PATHOLOGY

## 2021-03-29 DIAGNOSIS — D68312 Antiphospholipid antibody with hemorrhagic disorder: Secondary | ICD-10-CM | POA: Diagnosis not present

## 2021-03-29 DIAGNOSIS — M80061A Age-related osteoporosis with current pathological fracture, right lower leg, initial encounter for fracture: Secondary | ICD-10-CM | POA: Diagnosis not present

## 2021-03-29 DIAGNOSIS — G9341 Metabolic encephalopathy: Secondary | ICD-10-CM | POA: Diagnosis not present

## 2021-03-29 DIAGNOSIS — E039 Hypothyroidism, unspecified: Secondary | ICD-10-CM | POA: Diagnosis not present

## 2021-03-29 DIAGNOSIS — R278 Other lack of coordination: Secondary | ICD-10-CM | POA: Diagnosis not present

## 2021-03-29 DIAGNOSIS — N179 Acute kidney failure, unspecified: Secondary | ICD-10-CM | POA: Diagnosis not present

## 2021-03-29 DIAGNOSIS — I10 Essential (primary) hypertension: Secondary | ICD-10-CM | POA: Diagnosis not present

## 2021-03-29 DIAGNOSIS — M6281 Muscle weakness (generalized): Secondary | ICD-10-CM | POA: Diagnosis not present

## 2021-03-29 DIAGNOSIS — N183 Chronic kidney disease, stage 3 unspecified: Secondary | ICD-10-CM | POA: Diagnosis not present

## 2021-03-29 DIAGNOSIS — R2681 Unsteadiness on feet: Secondary | ICD-10-CM | POA: Diagnosis not present

## 2021-03-29 DIAGNOSIS — I639 Cerebral infarction, unspecified: Secondary | ICD-10-CM | POA: Diagnosis not present

## 2021-03-29 DIAGNOSIS — E782 Mixed hyperlipidemia: Secondary | ICD-10-CM | POA: Diagnosis not present

## 2021-03-30 DIAGNOSIS — M6281 Muscle weakness (generalized): Secondary | ICD-10-CM | POA: Diagnosis not present

## 2021-03-30 DIAGNOSIS — G9341 Metabolic encephalopathy: Secondary | ICD-10-CM | POA: Diagnosis not present

## 2021-03-30 DIAGNOSIS — R1312 Dysphagia, oropharyngeal phase: Secondary | ICD-10-CM | POA: Diagnosis not present

## 2021-03-30 DIAGNOSIS — R279 Unspecified lack of coordination: Secondary | ICD-10-CM | POA: Diagnosis not present

## 2021-03-30 DIAGNOSIS — N390 Urinary tract infection, site not specified: Secondary | ICD-10-CM | POA: Diagnosis not present

## 2021-03-30 DIAGNOSIS — I6349 Cerebral infarction due to embolism of other cerebral artery: Secondary | ICD-10-CM | POA: Diagnosis not present

## 2021-03-30 DIAGNOSIS — D6832 Hemorrhagic disorder due to extrinsic circulating anticoagulants: Secondary | ICD-10-CM | POA: Diagnosis not present

## 2021-03-31 DIAGNOSIS — D6832 Hemorrhagic disorder due to extrinsic circulating anticoagulants: Secondary | ICD-10-CM | POA: Diagnosis not present

## 2021-04-01 DIAGNOSIS — M84471D Pathological fracture, right ankle, subsequent encounter for fracture with routine healing: Secondary | ICD-10-CM | POA: Diagnosis not present

## 2021-04-01 DIAGNOSIS — D6832 Hemorrhagic disorder due to extrinsic circulating anticoagulants: Secondary | ICD-10-CM | POA: Diagnosis not present

## 2021-04-01 DIAGNOSIS — Z7901 Long term (current) use of anticoagulants: Secondary | ICD-10-CM | POA: Diagnosis not present

## 2021-04-02 DIAGNOSIS — F331 Major depressive disorder, recurrent, moderate: Secondary | ICD-10-CM | POA: Diagnosis not present

## 2021-04-02 DIAGNOSIS — R161 Splenomegaly, not elsewhere classified: Secondary | ICD-10-CM | POA: Diagnosis not present

## 2021-04-02 DIAGNOSIS — M6281 Muscle weakness (generalized): Secondary | ICD-10-CM | POA: Diagnosis not present

## 2021-04-02 DIAGNOSIS — N390 Urinary tract infection, site not specified: Secondary | ICD-10-CM | POA: Diagnosis not present

## 2021-04-02 DIAGNOSIS — F0151 Vascular dementia with behavioral disturbance: Secondary | ICD-10-CM | POA: Diagnosis not present

## 2021-04-02 DIAGNOSIS — R4182 Altered mental status, unspecified: Secondary | ICD-10-CM | POA: Diagnosis not present

## 2021-04-02 DIAGNOSIS — L89511 Pressure ulcer of right ankle, stage 1: Secondary | ICD-10-CM | POA: Diagnosis not present

## 2021-04-02 DIAGNOSIS — I10 Essential (primary) hypertension: Secondary | ICD-10-CM | POA: Diagnosis not present

## 2021-04-02 DIAGNOSIS — G9341 Metabolic encephalopathy: Secondary | ICD-10-CM | POA: Diagnosis not present

## 2021-04-02 DIAGNOSIS — M84471D Pathological fracture, right ankle, subsequent encounter for fracture with routine healing: Secondary | ICD-10-CM | POA: Diagnosis not present

## 2021-04-02 DIAGNOSIS — M80061A Age-related osteoporosis with current pathological fracture, right lower leg, initial encounter for fracture: Secondary | ICD-10-CM | POA: Diagnosis not present

## 2021-04-02 DIAGNOSIS — R296 Repeated falls: Secondary | ICD-10-CM | POA: Diagnosis not present

## 2021-04-02 DIAGNOSIS — E039 Hypothyroidism, unspecified: Secondary | ICD-10-CM | POA: Diagnosis not present

## 2021-04-02 DIAGNOSIS — Z8673 Personal history of transient ischemic attack (TIA), and cerebral infarction without residual deficits: Secondary | ICD-10-CM | POA: Diagnosis not present

## 2021-04-02 DIAGNOSIS — F5105 Insomnia due to other mental disorder: Secondary | ICD-10-CM | POA: Diagnosis not present

## 2021-04-02 DIAGNOSIS — I69228 Other speech and language deficits following other nontraumatic intracranial hemorrhage: Secondary | ICD-10-CM | POA: Diagnosis not present

## 2021-04-02 DIAGNOSIS — F330 Major depressive disorder, recurrent, mild: Secondary | ICD-10-CM | POA: Diagnosis not present

## 2021-04-02 DIAGNOSIS — S82851D Displaced trimalleolar fracture of right lower leg, subsequent encounter for closed fracture with routine healing: Secondary | ICD-10-CM | POA: Diagnosis not present

## 2021-04-02 DIAGNOSIS — Z7901 Long term (current) use of anticoagulants: Secondary | ICD-10-CM | POA: Diagnosis not present

## 2021-04-02 DIAGNOSIS — I63422 Cerebral infarction due to embolism of left anterior cerebral artery: Secondary | ICD-10-CM | POA: Diagnosis not present

## 2021-04-02 DIAGNOSIS — R278 Other lack of coordination: Secondary | ICD-10-CM | POA: Diagnosis not present

## 2021-04-02 DIAGNOSIS — F05 Delirium due to known physiological condition: Secondary | ICD-10-CM | POA: Diagnosis not present

## 2021-04-02 DIAGNOSIS — R531 Weakness: Secondary | ICD-10-CM | POA: Diagnosis not present

## 2021-04-02 DIAGNOSIS — F411 Generalized anxiety disorder: Secondary | ICD-10-CM | POA: Diagnosis not present

## 2021-04-02 DIAGNOSIS — N189 Chronic kidney disease, unspecified: Secondary | ICD-10-CM | POA: Diagnosis not present

## 2021-04-02 DIAGNOSIS — R1312 Dysphagia, oropharyngeal phase: Secondary | ICD-10-CM | POA: Diagnosis not present

## 2021-04-02 DIAGNOSIS — D735 Infarction of spleen: Secondary | ICD-10-CM | POA: Diagnosis not present

## 2021-04-02 DIAGNOSIS — G459 Transient cerebral ischemic attack, unspecified: Secondary | ICD-10-CM | POA: Diagnosis not present

## 2021-04-02 DIAGNOSIS — R41841 Cognitive communication deficit: Secondary | ICD-10-CM | POA: Diagnosis not present

## 2021-04-02 DIAGNOSIS — D68312 Antiphospholipid antibody with hemorrhagic disorder: Secondary | ICD-10-CM | POA: Diagnosis not present

## 2021-04-02 DIAGNOSIS — R2681 Unsteadiness on feet: Secondary | ICD-10-CM | POA: Diagnosis not present

## 2021-04-02 DIAGNOSIS — R269 Unspecified abnormalities of gait and mobility: Secondary | ICD-10-CM | POA: Diagnosis not present

## 2021-04-02 DIAGNOSIS — N179 Acute kidney failure, unspecified: Secondary | ICD-10-CM | POA: Diagnosis not present

## 2021-04-02 DIAGNOSIS — G479 Sleep disorder, unspecified: Secondary | ICD-10-CM | POA: Diagnosis not present

## 2021-04-02 DIAGNOSIS — I639 Cerebral infarction, unspecified: Secondary | ICD-10-CM | POA: Diagnosis not present

## 2021-04-02 DIAGNOSIS — R279 Unspecified lack of coordination: Secondary | ICD-10-CM | POA: Diagnosis not present

## 2021-04-02 DIAGNOSIS — I7409 Other arterial embolism and thrombosis of abdominal aorta: Secondary | ICD-10-CM | POA: Diagnosis not present

## 2021-04-02 DIAGNOSIS — E860 Dehydration: Secondary | ICD-10-CM | POA: Diagnosis not present

## 2021-04-02 DIAGNOSIS — W19XXXA Unspecified fall, initial encounter: Secondary | ICD-10-CM | POA: Diagnosis not present

## 2021-04-02 DIAGNOSIS — D509 Iron deficiency anemia, unspecified: Secondary | ICD-10-CM | POA: Diagnosis not present

## 2021-04-02 DIAGNOSIS — R410 Disorientation, unspecified: Secondary | ICD-10-CM | POA: Diagnosis not present

## 2021-04-02 DIAGNOSIS — I6350 Cerebral infarction due to unspecified occlusion or stenosis of unspecified cerebral artery: Secondary | ICD-10-CM | POA: Diagnosis not present

## 2021-04-02 DIAGNOSIS — E782 Mixed hyperlipidemia: Secondary | ICD-10-CM | POA: Diagnosis not present

## 2021-04-02 DIAGNOSIS — F419 Anxiety disorder, unspecified: Secondary | ICD-10-CM | POA: Diagnosis not present

## 2021-04-02 DIAGNOSIS — N183 Chronic kidney disease, stage 3 unspecified: Secondary | ICD-10-CM | POA: Diagnosis not present

## 2021-04-02 DIAGNOSIS — S82851A Displaced trimalleolar fracture of right lower leg, initial encounter for closed fracture: Secondary | ICD-10-CM | POA: Diagnosis not present

## 2021-04-02 DIAGNOSIS — R413 Other amnesia: Secondary | ICD-10-CM | POA: Diagnosis not present

## 2021-04-02 DIAGNOSIS — G3184 Mild cognitive impairment, so stated: Secondary | ICD-10-CM | POA: Diagnosis not present

## 2021-04-02 DIAGNOSIS — R7402 Elevation of levels of lactic acid dehydrogenase (LDH): Secondary | ICD-10-CM | POA: Diagnosis not present

## 2021-04-03 DIAGNOSIS — N179 Acute kidney failure, unspecified: Secondary | ICD-10-CM | POA: Diagnosis not present

## 2021-04-03 DIAGNOSIS — I10 Essential (primary) hypertension: Secondary | ICD-10-CM | POA: Diagnosis not present

## 2021-04-03 DIAGNOSIS — M6281 Muscle weakness (generalized): Secondary | ICD-10-CM | POA: Diagnosis not present

## 2021-04-03 DIAGNOSIS — E039 Hypothyroidism, unspecified: Secondary | ICD-10-CM | POA: Diagnosis not present

## 2021-04-03 DIAGNOSIS — I639 Cerebral infarction, unspecified: Secondary | ICD-10-CM | POA: Diagnosis not present

## 2021-04-03 DIAGNOSIS — M80061A Age-related osteoporosis with current pathological fracture, right lower leg, initial encounter for fracture: Secondary | ICD-10-CM | POA: Diagnosis not present

## 2021-04-03 DIAGNOSIS — E782 Mixed hyperlipidemia: Secondary | ICD-10-CM | POA: Diagnosis not present

## 2021-04-03 DIAGNOSIS — N183 Chronic kidney disease, stage 3 unspecified: Secondary | ICD-10-CM | POA: Diagnosis not present

## 2021-04-03 DIAGNOSIS — R2681 Unsteadiness on feet: Secondary | ICD-10-CM | POA: Diagnosis not present

## 2021-04-03 DIAGNOSIS — D68312 Antiphospholipid antibody with hemorrhagic disorder: Secondary | ICD-10-CM | POA: Diagnosis not present

## 2021-04-03 DIAGNOSIS — R278 Other lack of coordination: Secondary | ICD-10-CM | POA: Diagnosis not present

## 2021-04-03 DIAGNOSIS — G9341 Metabolic encephalopathy: Secondary | ICD-10-CM | POA: Diagnosis not present

## 2021-04-04 DIAGNOSIS — F05 Delirium due to known physiological condition: Secondary | ICD-10-CM | POA: Diagnosis not present

## 2021-04-04 DIAGNOSIS — Z7901 Long term (current) use of anticoagulants: Secondary | ICD-10-CM | POA: Diagnosis not present

## 2021-04-05 DIAGNOSIS — R2681 Unsteadiness on feet: Secondary | ICD-10-CM | POA: Diagnosis not present

## 2021-04-05 DIAGNOSIS — E039 Hypothyroidism, unspecified: Secondary | ICD-10-CM | POA: Diagnosis not present

## 2021-04-05 DIAGNOSIS — I10 Essential (primary) hypertension: Secondary | ICD-10-CM | POA: Diagnosis not present

## 2021-04-05 DIAGNOSIS — I639 Cerebral infarction, unspecified: Secondary | ICD-10-CM | POA: Diagnosis not present

## 2021-04-05 DIAGNOSIS — G9341 Metabolic encephalopathy: Secondary | ICD-10-CM | POA: Diagnosis not present

## 2021-04-05 DIAGNOSIS — M6281 Muscle weakness (generalized): Secondary | ICD-10-CM | POA: Diagnosis not present

## 2021-04-05 DIAGNOSIS — M80061A Age-related osteoporosis with current pathological fracture, right lower leg, initial encounter for fracture: Secondary | ICD-10-CM | POA: Diagnosis not present

## 2021-04-05 DIAGNOSIS — R278 Other lack of coordination: Secondary | ICD-10-CM | POA: Diagnosis not present

## 2021-04-05 DIAGNOSIS — E782 Mixed hyperlipidemia: Secondary | ICD-10-CM | POA: Diagnosis not present

## 2021-04-05 DIAGNOSIS — N179 Acute kidney failure, unspecified: Secondary | ICD-10-CM | POA: Diagnosis not present

## 2021-04-05 DIAGNOSIS — N183 Chronic kidney disease, stage 3 unspecified: Secondary | ICD-10-CM | POA: Diagnosis not present

## 2021-04-05 DIAGNOSIS — D68312 Antiphospholipid antibody with hemorrhagic disorder: Secondary | ICD-10-CM | POA: Diagnosis not present

## 2021-04-06 DIAGNOSIS — Z7901 Long term (current) use of anticoagulants: Secondary | ICD-10-CM | POA: Diagnosis not present

## 2021-04-06 DIAGNOSIS — S82851A Displaced trimalleolar fracture of right lower leg, initial encounter for closed fracture: Secondary | ICD-10-CM | POA: Diagnosis not present

## 2021-04-07 DIAGNOSIS — M80061A Age-related osteoporosis with current pathological fracture, right lower leg, initial encounter for fracture: Secondary | ICD-10-CM | POA: Diagnosis not present

## 2021-04-07 DIAGNOSIS — G9341 Metabolic encephalopathy: Secondary | ICD-10-CM | POA: Diagnosis not present

## 2021-04-07 DIAGNOSIS — N179 Acute kidney failure, unspecified: Secondary | ICD-10-CM | POA: Diagnosis not present

## 2021-04-07 DIAGNOSIS — I639 Cerebral infarction, unspecified: Secondary | ICD-10-CM | POA: Diagnosis not present

## 2021-04-07 DIAGNOSIS — R2681 Unsteadiness on feet: Secondary | ICD-10-CM | POA: Diagnosis not present

## 2021-04-07 DIAGNOSIS — Z7901 Long term (current) use of anticoagulants: Secondary | ICD-10-CM | POA: Diagnosis not present

## 2021-04-07 DIAGNOSIS — I10 Essential (primary) hypertension: Secondary | ICD-10-CM | POA: Diagnosis not present

## 2021-04-07 DIAGNOSIS — R410 Disorientation, unspecified: Secondary | ICD-10-CM | POA: Diagnosis not present

## 2021-04-07 DIAGNOSIS — D68312 Antiphospholipid antibody with hemorrhagic disorder: Secondary | ICD-10-CM | POA: Diagnosis not present

## 2021-04-07 DIAGNOSIS — E039 Hypothyroidism, unspecified: Secondary | ICD-10-CM | POA: Diagnosis not present

## 2021-04-07 DIAGNOSIS — M6281 Muscle weakness (generalized): Secondary | ICD-10-CM | POA: Diagnosis not present

## 2021-04-07 DIAGNOSIS — N183 Chronic kidney disease, stage 3 unspecified: Secondary | ICD-10-CM | POA: Diagnosis not present

## 2021-04-07 DIAGNOSIS — R278 Other lack of coordination: Secondary | ICD-10-CM | POA: Diagnosis not present

## 2021-04-07 DIAGNOSIS — E782 Mixed hyperlipidemia: Secondary | ICD-10-CM | POA: Diagnosis not present

## 2021-04-10 DIAGNOSIS — I10 Essential (primary) hypertension: Secondary | ICD-10-CM | POA: Diagnosis not present

## 2021-04-10 DIAGNOSIS — R278 Other lack of coordination: Secondary | ICD-10-CM | POA: Diagnosis not present

## 2021-04-10 DIAGNOSIS — E782 Mixed hyperlipidemia: Secondary | ICD-10-CM | POA: Diagnosis not present

## 2021-04-10 DIAGNOSIS — G9341 Metabolic encephalopathy: Secondary | ICD-10-CM | POA: Diagnosis not present

## 2021-04-10 DIAGNOSIS — N179 Acute kidney failure, unspecified: Secondary | ICD-10-CM | POA: Diagnosis not present

## 2021-04-10 DIAGNOSIS — N183 Chronic kidney disease, stage 3 unspecified: Secondary | ICD-10-CM | POA: Diagnosis not present

## 2021-04-10 DIAGNOSIS — D68312 Antiphospholipid antibody with hemorrhagic disorder: Secondary | ICD-10-CM | POA: Diagnosis not present

## 2021-04-10 DIAGNOSIS — E039 Hypothyroidism, unspecified: Secondary | ICD-10-CM | POA: Diagnosis not present

## 2021-04-10 DIAGNOSIS — M80061A Age-related osteoporosis with current pathological fracture, right lower leg, initial encounter for fracture: Secondary | ICD-10-CM | POA: Diagnosis not present

## 2021-04-10 DIAGNOSIS — I639 Cerebral infarction, unspecified: Secondary | ICD-10-CM | POA: Diagnosis not present

## 2021-04-10 DIAGNOSIS — M6281 Muscle weakness (generalized): Secondary | ICD-10-CM | POA: Diagnosis not present

## 2021-04-10 DIAGNOSIS — R2681 Unsteadiness on feet: Secondary | ICD-10-CM | POA: Diagnosis not present

## 2021-04-12 DIAGNOSIS — N189 Chronic kidney disease, unspecified: Secondary | ICD-10-CM | POA: Diagnosis not present

## 2021-04-12 DIAGNOSIS — N179 Acute kidney failure, unspecified: Secondary | ICD-10-CM | POA: Diagnosis not present

## 2021-04-12 DIAGNOSIS — D68312 Antiphospholipid antibody with hemorrhagic disorder: Secondary | ICD-10-CM | POA: Diagnosis not present

## 2021-04-12 DIAGNOSIS — M80061A Age-related osteoporosis with current pathological fracture, right lower leg, initial encounter for fracture: Secondary | ICD-10-CM | POA: Diagnosis not present

## 2021-04-12 DIAGNOSIS — E039 Hypothyroidism, unspecified: Secondary | ICD-10-CM | POA: Diagnosis not present

## 2021-04-12 DIAGNOSIS — G9341 Metabolic encephalopathy: Secondary | ICD-10-CM | POA: Diagnosis not present

## 2021-04-12 DIAGNOSIS — R2681 Unsteadiness on feet: Secondary | ICD-10-CM | POA: Diagnosis not present

## 2021-04-12 DIAGNOSIS — Z7901 Long term (current) use of anticoagulants: Secondary | ICD-10-CM | POA: Diagnosis not present

## 2021-04-12 DIAGNOSIS — M84471D Pathological fracture, right ankle, subsequent encounter for fracture with routine healing: Secondary | ICD-10-CM | POA: Diagnosis not present

## 2021-04-12 DIAGNOSIS — E782 Mixed hyperlipidemia: Secondary | ICD-10-CM | POA: Diagnosis not present

## 2021-04-12 DIAGNOSIS — I639 Cerebral infarction, unspecified: Secondary | ICD-10-CM | POA: Diagnosis not present

## 2021-04-12 DIAGNOSIS — R278 Other lack of coordination: Secondary | ICD-10-CM | POA: Diagnosis not present

## 2021-04-12 DIAGNOSIS — M6281 Muscle weakness (generalized): Secondary | ICD-10-CM | POA: Diagnosis not present

## 2021-04-12 DIAGNOSIS — I10 Essential (primary) hypertension: Secondary | ICD-10-CM | POA: Diagnosis not present

## 2021-04-12 DIAGNOSIS — N183 Chronic kidney disease, stage 3 unspecified: Secondary | ICD-10-CM | POA: Diagnosis not present

## 2021-04-12 DIAGNOSIS — R410 Disorientation, unspecified: Secondary | ICD-10-CM | POA: Diagnosis not present

## 2021-04-14 DIAGNOSIS — I10 Essential (primary) hypertension: Secondary | ICD-10-CM | POA: Diagnosis not present

## 2021-04-14 DIAGNOSIS — G9341 Metabolic encephalopathy: Secondary | ICD-10-CM | POA: Diagnosis not present

## 2021-04-14 DIAGNOSIS — I639 Cerebral infarction, unspecified: Secondary | ICD-10-CM | POA: Diagnosis not present

## 2021-04-14 DIAGNOSIS — N179 Acute kidney failure, unspecified: Secondary | ICD-10-CM | POA: Diagnosis not present

## 2021-04-14 DIAGNOSIS — R278 Other lack of coordination: Secondary | ICD-10-CM | POA: Diagnosis not present

## 2021-04-14 DIAGNOSIS — E782 Mixed hyperlipidemia: Secondary | ICD-10-CM | POA: Diagnosis not present

## 2021-04-14 DIAGNOSIS — E039 Hypothyroidism, unspecified: Secondary | ICD-10-CM | POA: Diagnosis not present

## 2021-04-14 DIAGNOSIS — M6281 Muscle weakness (generalized): Secondary | ICD-10-CM | POA: Diagnosis not present

## 2021-04-14 DIAGNOSIS — N183 Chronic kidney disease, stage 3 unspecified: Secondary | ICD-10-CM | POA: Diagnosis not present

## 2021-04-14 DIAGNOSIS — R2681 Unsteadiness on feet: Secondary | ICD-10-CM | POA: Diagnosis not present

## 2021-04-14 DIAGNOSIS — M80061A Age-related osteoporosis with current pathological fracture, right lower leg, initial encounter for fracture: Secondary | ICD-10-CM | POA: Diagnosis not present

## 2021-04-14 DIAGNOSIS — D68312 Antiphospholipid antibody with hemorrhagic disorder: Secondary | ICD-10-CM | POA: Diagnosis not present

## 2021-04-17 DIAGNOSIS — N183 Chronic kidney disease, stage 3 unspecified: Secondary | ICD-10-CM | POA: Diagnosis not present

## 2021-04-17 DIAGNOSIS — I639 Cerebral infarction, unspecified: Secondary | ICD-10-CM | POA: Diagnosis not present

## 2021-04-17 DIAGNOSIS — M6281 Muscle weakness (generalized): Secondary | ICD-10-CM | POA: Diagnosis not present

## 2021-04-17 DIAGNOSIS — E782 Mixed hyperlipidemia: Secondary | ICD-10-CM | POA: Diagnosis not present

## 2021-04-17 DIAGNOSIS — I10 Essential (primary) hypertension: Secondary | ICD-10-CM | POA: Diagnosis not present

## 2021-04-17 DIAGNOSIS — M84471D Pathological fracture, right ankle, subsequent encounter for fracture with routine healing: Secondary | ICD-10-CM | POA: Diagnosis not present

## 2021-04-17 DIAGNOSIS — R410 Disorientation, unspecified: Secondary | ICD-10-CM | POA: Diagnosis not present

## 2021-04-17 DIAGNOSIS — G9341 Metabolic encephalopathy: Secondary | ICD-10-CM | POA: Diagnosis not present

## 2021-04-17 DIAGNOSIS — N189 Chronic kidney disease, unspecified: Secondary | ICD-10-CM | POA: Diagnosis not present

## 2021-04-17 DIAGNOSIS — Z7901 Long term (current) use of anticoagulants: Secondary | ICD-10-CM | POA: Diagnosis not present

## 2021-04-17 DIAGNOSIS — M80061A Age-related osteoporosis with current pathological fracture, right lower leg, initial encounter for fracture: Secondary | ICD-10-CM | POA: Diagnosis not present

## 2021-04-17 DIAGNOSIS — R2681 Unsteadiness on feet: Secondary | ICD-10-CM | POA: Diagnosis not present

## 2021-04-17 DIAGNOSIS — E039 Hypothyroidism, unspecified: Secondary | ICD-10-CM | POA: Diagnosis not present

## 2021-04-17 DIAGNOSIS — R278 Other lack of coordination: Secondary | ICD-10-CM | POA: Diagnosis not present

## 2021-04-17 DIAGNOSIS — D68312 Antiphospholipid antibody with hemorrhagic disorder: Secondary | ICD-10-CM | POA: Diagnosis not present

## 2021-04-17 DIAGNOSIS — N179 Acute kidney failure, unspecified: Secondary | ICD-10-CM | POA: Diagnosis not present

## 2021-04-18 DIAGNOSIS — Z7901 Long term (current) use of anticoagulants: Secondary | ICD-10-CM | POA: Diagnosis not present

## 2021-04-19 ENCOUNTER — Ambulatory Visit: Payer: Medicare Other

## 2021-04-19 DIAGNOSIS — R278 Other lack of coordination: Secondary | ICD-10-CM | POA: Diagnosis not present

## 2021-04-19 DIAGNOSIS — D68312 Antiphospholipid antibody with hemorrhagic disorder: Secondary | ICD-10-CM | POA: Diagnosis not present

## 2021-04-19 DIAGNOSIS — E782 Mixed hyperlipidemia: Secondary | ICD-10-CM | POA: Diagnosis not present

## 2021-04-19 DIAGNOSIS — N183 Chronic kidney disease, stage 3 unspecified: Secondary | ICD-10-CM | POA: Diagnosis not present

## 2021-04-19 DIAGNOSIS — N179 Acute kidney failure, unspecified: Secondary | ICD-10-CM | POA: Diagnosis not present

## 2021-04-19 DIAGNOSIS — R2681 Unsteadiness on feet: Secondary | ICD-10-CM | POA: Diagnosis not present

## 2021-04-19 DIAGNOSIS — I10 Essential (primary) hypertension: Secondary | ICD-10-CM | POA: Diagnosis not present

## 2021-04-19 DIAGNOSIS — M6281 Muscle weakness (generalized): Secondary | ICD-10-CM | POA: Diagnosis not present

## 2021-04-19 DIAGNOSIS — G9341 Metabolic encephalopathy: Secondary | ICD-10-CM | POA: Diagnosis not present

## 2021-04-19 DIAGNOSIS — E039 Hypothyroidism, unspecified: Secondary | ICD-10-CM | POA: Diagnosis not present

## 2021-04-19 DIAGNOSIS — M80061A Age-related osteoporosis with current pathological fracture, right lower leg, initial encounter for fracture: Secondary | ICD-10-CM | POA: Diagnosis not present

## 2021-04-19 DIAGNOSIS — I639 Cerebral infarction, unspecified: Secondary | ICD-10-CM | POA: Diagnosis not present

## 2021-04-20 DIAGNOSIS — N189 Chronic kidney disease, unspecified: Secondary | ICD-10-CM | POA: Diagnosis not present

## 2021-04-20 DIAGNOSIS — Z7901 Long term (current) use of anticoagulants: Secondary | ICD-10-CM | POA: Diagnosis not present

## 2021-04-21 DIAGNOSIS — M80061A Age-related osteoporosis with current pathological fracture, right lower leg, initial encounter for fracture: Secondary | ICD-10-CM | POA: Diagnosis not present

## 2021-04-21 DIAGNOSIS — I10 Essential (primary) hypertension: Secondary | ICD-10-CM | POA: Diagnosis not present

## 2021-04-21 DIAGNOSIS — N183 Chronic kidney disease, stage 3 unspecified: Secondary | ICD-10-CM | POA: Diagnosis not present

## 2021-04-21 DIAGNOSIS — E039 Hypothyroidism, unspecified: Secondary | ICD-10-CM | POA: Diagnosis not present

## 2021-04-21 DIAGNOSIS — R2681 Unsteadiness on feet: Secondary | ICD-10-CM | POA: Diagnosis not present

## 2021-04-21 DIAGNOSIS — M6281 Muscle weakness (generalized): Secondary | ICD-10-CM | POA: Diagnosis not present

## 2021-04-21 DIAGNOSIS — E782 Mixed hyperlipidemia: Secondary | ICD-10-CM | POA: Diagnosis not present

## 2021-04-21 DIAGNOSIS — G9341 Metabolic encephalopathy: Secondary | ICD-10-CM | POA: Diagnosis not present

## 2021-04-21 DIAGNOSIS — R278 Other lack of coordination: Secondary | ICD-10-CM | POA: Diagnosis not present

## 2021-04-21 DIAGNOSIS — D68312 Antiphospholipid antibody with hemorrhagic disorder: Secondary | ICD-10-CM | POA: Diagnosis not present

## 2021-04-21 DIAGNOSIS — I639 Cerebral infarction, unspecified: Secondary | ICD-10-CM | POA: Diagnosis not present

## 2021-04-21 DIAGNOSIS — N179 Acute kidney failure, unspecified: Secondary | ICD-10-CM | POA: Diagnosis not present

## 2021-04-24 DIAGNOSIS — G9341 Metabolic encephalopathy: Secondary | ICD-10-CM | POA: Diagnosis not present

## 2021-04-24 DIAGNOSIS — N183 Chronic kidney disease, stage 3 unspecified: Secondary | ICD-10-CM | POA: Diagnosis not present

## 2021-04-24 DIAGNOSIS — I10 Essential (primary) hypertension: Secondary | ICD-10-CM | POA: Diagnosis not present

## 2021-04-24 DIAGNOSIS — M80061A Age-related osteoporosis with current pathological fracture, right lower leg, initial encounter for fracture: Secondary | ICD-10-CM | POA: Diagnosis not present

## 2021-04-24 DIAGNOSIS — M6281 Muscle weakness (generalized): Secondary | ICD-10-CM | POA: Diagnosis not present

## 2021-04-24 DIAGNOSIS — E039 Hypothyroidism, unspecified: Secondary | ICD-10-CM | POA: Diagnosis not present

## 2021-04-24 DIAGNOSIS — R278 Other lack of coordination: Secondary | ICD-10-CM | POA: Diagnosis not present

## 2021-04-24 DIAGNOSIS — D68312 Antiphospholipid antibody with hemorrhagic disorder: Secondary | ICD-10-CM | POA: Diagnosis not present

## 2021-04-24 DIAGNOSIS — R2681 Unsteadiness on feet: Secondary | ICD-10-CM | POA: Diagnosis not present

## 2021-04-24 DIAGNOSIS — E782 Mixed hyperlipidemia: Secondary | ICD-10-CM | POA: Diagnosis not present

## 2021-04-24 DIAGNOSIS — I639 Cerebral infarction, unspecified: Secondary | ICD-10-CM | POA: Diagnosis not present

## 2021-04-24 DIAGNOSIS — Z7901 Long term (current) use of anticoagulants: Secondary | ICD-10-CM | POA: Diagnosis not present

## 2021-04-24 DIAGNOSIS — N179 Acute kidney failure, unspecified: Secondary | ICD-10-CM | POA: Diagnosis not present

## 2021-04-25 DIAGNOSIS — R161 Splenomegaly, not elsewhere classified: Secondary | ICD-10-CM | POA: Diagnosis not present

## 2021-04-26 DIAGNOSIS — I639 Cerebral infarction, unspecified: Secondary | ICD-10-CM | POA: Diagnosis not present

## 2021-04-26 DIAGNOSIS — R2681 Unsteadiness on feet: Secondary | ICD-10-CM | POA: Diagnosis not present

## 2021-04-26 DIAGNOSIS — N179 Acute kidney failure, unspecified: Secondary | ICD-10-CM | POA: Diagnosis not present

## 2021-04-26 DIAGNOSIS — E782 Mixed hyperlipidemia: Secondary | ICD-10-CM | POA: Diagnosis not present

## 2021-04-26 DIAGNOSIS — G9341 Metabolic encephalopathy: Secondary | ICD-10-CM | POA: Diagnosis not present

## 2021-04-26 DIAGNOSIS — M6281 Muscle weakness (generalized): Secondary | ICD-10-CM | POA: Diagnosis not present

## 2021-04-26 DIAGNOSIS — I10 Essential (primary) hypertension: Secondary | ICD-10-CM | POA: Diagnosis not present

## 2021-04-26 DIAGNOSIS — M80061A Age-related osteoporosis with current pathological fracture, right lower leg, initial encounter for fracture: Secondary | ICD-10-CM | POA: Diagnosis not present

## 2021-04-26 DIAGNOSIS — E039 Hypothyroidism, unspecified: Secondary | ICD-10-CM | POA: Diagnosis not present

## 2021-04-26 DIAGNOSIS — D68312 Antiphospholipid antibody with hemorrhagic disorder: Secondary | ICD-10-CM | POA: Diagnosis not present

## 2021-04-26 DIAGNOSIS — N183 Chronic kidney disease, stage 3 unspecified: Secondary | ICD-10-CM | POA: Diagnosis not present

## 2021-04-26 DIAGNOSIS — R278 Other lack of coordination: Secondary | ICD-10-CM | POA: Diagnosis not present

## 2021-04-26 DIAGNOSIS — Z7901 Long term (current) use of anticoagulants: Secondary | ICD-10-CM | POA: Diagnosis not present

## 2021-04-27 DIAGNOSIS — M6281 Muscle weakness (generalized): Secondary | ICD-10-CM | POA: Diagnosis not present

## 2021-04-27 DIAGNOSIS — Z7901 Long term (current) use of anticoagulants: Secondary | ICD-10-CM | POA: Diagnosis not present

## 2021-04-27 DIAGNOSIS — L89511 Pressure ulcer of right ankle, stage 1: Secondary | ICD-10-CM | POA: Diagnosis not present

## 2021-04-27 DIAGNOSIS — N390 Urinary tract infection, site not specified: Secondary | ICD-10-CM | POA: Diagnosis not present

## 2021-04-27 DIAGNOSIS — S82851D Displaced trimalleolar fracture of right lower leg, subsequent encounter for closed fracture with routine healing: Secondary | ICD-10-CM | POA: Diagnosis not present

## 2021-04-27 DIAGNOSIS — Z8673 Personal history of transient ischemic attack (TIA), and cerebral infarction without residual deficits: Secondary | ICD-10-CM | POA: Diagnosis not present

## 2021-04-28 DIAGNOSIS — E782 Mixed hyperlipidemia: Secondary | ICD-10-CM | POA: Diagnosis not present

## 2021-04-28 DIAGNOSIS — I10 Essential (primary) hypertension: Secondary | ICD-10-CM | POA: Diagnosis not present

## 2021-04-28 DIAGNOSIS — R278 Other lack of coordination: Secondary | ICD-10-CM | POA: Diagnosis not present

## 2021-04-28 DIAGNOSIS — R2681 Unsteadiness on feet: Secondary | ICD-10-CM | POA: Diagnosis not present

## 2021-04-28 DIAGNOSIS — N179 Acute kidney failure, unspecified: Secondary | ICD-10-CM | POA: Diagnosis not present

## 2021-04-28 DIAGNOSIS — G9341 Metabolic encephalopathy: Secondary | ICD-10-CM | POA: Diagnosis not present

## 2021-04-28 DIAGNOSIS — I639 Cerebral infarction, unspecified: Secondary | ICD-10-CM | POA: Diagnosis not present

## 2021-04-28 DIAGNOSIS — D68312 Antiphospholipid antibody with hemorrhagic disorder: Secondary | ICD-10-CM | POA: Diagnosis not present

## 2021-04-28 DIAGNOSIS — M6281 Muscle weakness (generalized): Secondary | ICD-10-CM | POA: Diagnosis not present

## 2021-04-28 DIAGNOSIS — M80061A Age-related osteoporosis with current pathological fracture, right lower leg, initial encounter for fracture: Secondary | ICD-10-CM | POA: Diagnosis not present

## 2021-04-28 DIAGNOSIS — E039 Hypothyroidism, unspecified: Secondary | ICD-10-CM | POA: Diagnosis not present

## 2021-04-28 DIAGNOSIS — N183 Chronic kidney disease, stage 3 unspecified: Secondary | ICD-10-CM | POA: Diagnosis not present

## 2021-05-02 DIAGNOSIS — M84471D Pathological fracture, right ankle, subsequent encounter for fracture with routine healing: Secondary | ICD-10-CM | POA: Diagnosis not present

## 2021-05-02 DIAGNOSIS — I10 Essential (primary) hypertension: Secondary | ICD-10-CM | POA: Diagnosis not present

## 2021-05-02 DIAGNOSIS — R531 Weakness: Secondary | ICD-10-CM | POA: Diagnosis not present

## 2021-05-02 DIAGNOSIS — W19XXXA Unspecified fall, initial encounter: Secondary | ICD-10-CM | POA: Diagnosis not present

## 2021-05-03 ENCOUNTER — Telehealth: Payer: Self-pay

## 2021-05-03 ENCOUNTER — Telehealth: Payer: Medicare Other | Admitting: Surgery

## 2021-05-03 ENCOUNTER — Other Ambulatory Visit: Payer: Self-pay

## 2021-05-03 ENCOUNTER — Other Ambulatory Visit: Payer: Self-pay | Admitting: Surgery

## 2021-05-03 ENCOUNTER — Ambulatory Visit
Admission: RE | Admit: 2021-05-03 | Discharge: 2021-05-03 | Disposition: A | Discharge status: Home / Self Care | Payer: Self-pay | Source: Ambulatory Visit | Attending: Surgery | Admitting: Surgery

## 2021-05-03 DIAGNOSIS — R278 Other lack of coordination: Secondary | ICD-10-CM | POA: Diagnosis not present

## 2021-05-03 DIAGNOSIS — M6281 Muscle weakness (generalized): Secondary | ICD-10-CM | POA: Diagnosis not present

## 2021-05-03 DIAGNOSIS — D7389 Other diseases of spleen: Secondary | ICD-10-CM

## 2021-05-03 DIAGNOSIS — I639 Cerebral infarction, unspecified: Secondary | ICD-10-CM | POA: Diagnosis not present

## 2021-05-03 DIAGNOSIS — N179 Acute kidney failure, unspecified: Secondary | ICD-10-CM | POA: Diagnosis not present

## 2021-05-03 DIAGNOSIS — E039 Hypothyroidism, unspecified: Secondary | ICD-10-CM | POA: Diagnosis not present

## 2021-05-03 DIAGNOSIS — Z7901 Long term (current) use of anticoagulants: Secondary | ICD-10-CM | POA: Diagnosis not present

## 2021-05-03 DIAGNOSIS — D68312 Antiphospholipid antibody with hemorrhagic disorder: Secondary | ICD-10-CM | POA: Diagnosis not present

## 2021-05-03 DIAGNOSIS — E782 Mixed hyperlipidemia: Secondary | ICD-10-CM | POA: Diagnosis not present

## 2021-05-03 DIAGNOSIS — R2681 Unsteadiness on feet: Secondary | ICD-10-CM | POA: Diagnosis not present

## 2021-05-03 DIAGNOSIS — M80061A Age-related osteoporosis with current pathological fracture, right lower leg, initial encounter for fracture: Secondary | ICD-10-CM | POA: Diagnosis not present

## 2021-05-03 DIAGNOSIS — N183 Chronic kidney disease, stage 3 unspecified: Secondary | ICD-10-CM | POA: Diagnosis not present

## 2021-05-03 DIAGNOSIS — G9341 Metabolic encephalopathy: Secondary | ICD-10-CM | POA: Diagnosis not present

## 2021-05-03 DIAGNOSIS — I10 Essential (primary) hypertension: Secondary | ICD-10-CM | POA: Diagnosis not present

## 2021-05-03 DIAGNOSIS — N390 Urinary tract infection, site not specified: Secondary | ICD-10-CM | POA: Diagnosis not present

## 2021-05-03 NOTE — Telephone Encounter (Signed)
Spoke with daughter-her mom is in a facility that does not transport to [place]-patient had US spleen a few weeks ago and daughter is bring images to [place] today.

## 2021-05-04 ENCOUNTER — Ambulatory Visit: Payer: Medicare Other

## 2021-05-05 ENCOUNTER — Other Ambulatory Visit: Payer: Self-pay

## 2021-05-05 ENCOUNTER — Encounter: Payer: Self-pay | Admitting: Surgery

## 2021-05-05 ENCOUNTER — Telehealth (INDEPENDENT_AMBULATORY_CARE_PROVIDER_SITE_OTHER): Payer: Medicare Other | Admitting: Surgery

## 2021-05-05 DIAGNOSIS — D68312 Antiphospholipid antibody with hemorrhagic disorder: Secondary | ICD-10-CM | POA: Diagnosis not present

## 2021-05-05 DIAGNOSIS — E782 Mixed hyperlipidemia: Secondary | ICD-10-CM | POA: Diagnosis not present

## 2021-05-05 DIAGNOSIS — D735 Infarction of spleen: Secondary | ICD-10-CM | POA: Diagnosis not present

## 2021-05-05 DIAGNOSIS — I639 Cerebral infarction, unspecified: Secondary | ICD-10-CM | POA: Diagnosis not present

## 2021-05-05 DIAGNOSIS — E039 Hypothyroidism, unspecified: Secondary | ICD-10-CM | POA: Diagnosis not present

## 2021-05-05 DIAGNOSIS — N179 Acute kidney failure, unspecified: Secondary | ICD-10-CM | POA: Diagnosis not present

## 2021-05-05 DIAGNOSIS — G9341 Metabolic encephalopathy: Secondary | ICD-10-CM | POA: Diagnosis not present

## 2021-05-05 DIAGNOSIS — M80061A Age-related osteoporosis with current pathological fracture, right lower leg, initial encounter for fracture: Secondary | ICD-10-CM | POA: Diagnosis not present

## 2021-05-05 DIAGNOSIS — M6281 Muscle weakness (generalized): Secondary | ICD-10-CM | POA: Diagnosis not present

## 2021-05-05 DIAGNOSIS — N183 Chronic kidney disease, stage 3 unspecified: Secondary | ICD-10-CM | POA: Diagnosis not present

## 2021-05-05 DIAGNOSIS — R2681 Unsteadiness on feet: Secondary | ICD-10-CM | POA: Diagnosis not present

## 2021-05-05 DIAGNOSIS — I10 Essential (primary) hypertension: Secondary | ICD-10-CM | POA: Diagnosis not present

## 2021-05-05 DIAGNOSIS — R278 Other lack of coordination: Secondary | ICD-10-CM | POA: Diagnosis not present

## 2021-05-05 NOTE — Progress Notes (Signed)
Virtual Visit via Telephone Note  I connected with [name] on 05/05/21 at  9:00 AM EDT by telephone and verified that I am speaking with the correct person using two identifiers.  I spoke with her daughter, [name], who is also her power of attorney.    Location: Patient: Nursing home Provider: Office   I discussed the limitations, risks, security and privacy concerns of performing an evaluation and management service by telephone and the availability of in person appointments. I also discussed with the patient that there may be a patient responsible charge related to this service. The patient expressed understanding and agreed to proceed.   History of Present Illness: This is a 70 yo female with recent history of splenic hematoma, admitted to the hospital on 03/11/21.  She has been having issues with chronic and recurrent UTIs since her admission, and her daughter reports that today the patient is very confused, thinking she was in [facility] today.    From the splenic hematoma standpoint, her daughter reports that her LUQ pain resolved after about a week from discharge and she has not had any issues with LUQ discomfort.  She had a repeat ultrasound on 04/25/21.   Observations/Objective: Only talked to patient's daughter as the patient is confused today.  Assessment and Plan: 70 yo female with hx of recent splenic hematoma.  --When comparing the images of her ultrasound on 4/12 with the ones from 5/24, the hematoma has become smaller in size and the fluid component is also decreased.  She is on coumadin and thus will take longer for the hematoma to fully resolve, but she's without any pain issues or complaints.   --No acute surgical needs at this point. Discussed that she should contact us if she starts having any worsening pain in the LUQ again.  No other ultrasound needed unless new symptoms. --Follow up as needed.  Follow Up Instructions:    I discussed the  assessment and treatment plan with the patient. The patient was provided an opportunity to ask questions and all were answered. The patient agreed with the plan and demonstrated an understanding of the instructions.   The patient was advised to call back or seek an in-person evaluation if the symptoms worsen or if the condition fails to improve as anticipated.  I provided 20 minutes of non-face-to-face time during this encounter.   Olean Ree, MD

## 2021-05-08 DIAGNOSIS — I639 Cerebral infarction, unspecified: Secondary | ICD-10-CM | POA: Diagnosis not present

## 2021-05-08 DIAGNOSIS — G9341 Metabolic encephalopathy: Secondary | ICD-10-CM | POA: Diagnosis not present

## 2021-05-08 DIAGNOSIS — M80061A Age-related osteoporosis with current pathological fracture, right lower leg, initial encounter for fracture: Secondary | ICD-10-CM | POA: Diagnosis not present

## 2021-05-08 DIAGNOSIS — I10 Essential (primary) hypertension: Secondary | ICD-10-CM | POA: Diagnosis not present

## 2021-05-08 DIAGNOSIS — Z7901 Long term (current) use of anticoagulants: Secondary | ICD-10-CM | POA: Diagnosis not present

## 2021-05-08 DIAGNOSIS — R2681 Unsteadiness on feet: Secondary | ICD-10-CM | POA: Diagnosis not present

## 2021-05-08 DIAGNOSIS — N179 Acute kidney failure, unspecified: Secondary | ICD-10-CM | POA: Diagnosis not present

## 2021-05-08 DIAGNOSIS — E782 Mixed hyperlipidemia: Secondary | ICD-10-CM | POA: Diagnosis not present

## 2021-05-08 DIAGNOSIS — E039 Hypothyroidism, unspecified: Secondary | ICD-10-CM | POA: Diagnosis not present

## 2021-05-08 DIAGNOSIS — D68312 Antiphospholipid antibody with hemorrhagic disorder: Secondary | ICD-10-CM | POA: Diagnosis not present

## 2021-05-08 DIAGNOSIS — M6281 Muscle weakness (generalized): Secondary | ICD-10-CM | POA: Diagnosis not present

## 2021-05-08 DIAGNOSIS — R278 Other lack of coordination: Secondary | ICD-10-CM | POA: Diagnosis not present

## 2021-05-08 DIAGNOSIS — N183 Chronic kidney disease, stage 3 unspecified: Secondary | ICD-10-CM | POA: Diagnosis not present

## 2021-05-10 DIAGNOSIS — N179 Acute kidney failure, unspecified: Secondary | ICD-10-CM | POA: Diagnosis not present

## 2021-05-10 DIAGNOSIS — Z7901 Long term (current) use of anticoagulants: Secondary | ICD-10-CM | POA: Diagnosis not present

## 2021-05-10 DIAGNOSIS — M6281 Muscle weakness (generalized): Secondary | ICD-10-CM | POA: Diagnosis not present

## 2021-05-10 DIAGNOSIS — I639 Cerebral infarction, unspecified: Secondary | ICD-10-CM | POA: Diagnosis not present

## 2021-05-10 DIAGNOSIS — G9341 Metabolic encephalopathy: Secondary | ICD-10-CM | POA: Diagnosis not present

## 2021-05-10 DIAGNOSIS — M80061A Age-related osteoporosis with current pathological fracture, right lower leg, initial encounter for fracture: Secondary | ICD-10-CM | POA: Diagnosis not present

## 2021-05-10 DIAGNOSIS — E782 Mixed hyperlipidemia: Secondary | ICD-10-CM | POA: Diagnosis not present

## 2021-05-10 DIAGNOSIS — R2681 Unsteadiness on feet: Secondary | ICD-10-CM | POA: Diagnosis not present

## 2021-05-10 DIAGNOSIS — I10 Essential (primary) hypertension: Secondary | ICD-10-CM | POA: Diagnosis not present

## 2021-05-10 DIAGNOSIS — E039 Hypothyroidism, unspecified: Secondary | ICD-10-CM | POA: Diagnosis not present

## 2021-05-10 DIAGNOSIS — R278 Other lack of coordination: Secondary | ICD-10-CM | POA: Diagnosis not present

## 2021-05-10 DIAGNOSIS — D68312 Antiphospholipid antibody with hemorrhagic disorder: Secondary | ICD-10-CM | POA: Diagnosis not present

## 2021-05-11 DIAGNOSIS — G3184 Mild cognitive impairment, so stated: Secondary | ICD-10-CM | POA: Diagnosis not present

## 2021-05-11 DIAGNOSIS — F331 Major depressive disorder, recurrent, moderate: Secondary | ICD-10-CM | POA: Diagnosis not present

## 2021-05-11 DIAGNOSIS — F5105 Insomnia due to other mental disorder: Secondary | ICD-10-CM | POA: Diagnosis not present

## 2021-05-12 DIAGNOSIS — M80061A Age-related osteoporosis with current pathological fracture, right lower leg, initial encounter for fracture: Secondary | ICD-10-CM | POA: Diagnosis not present

## 2021-05-12 DIAGNOSIS — E782 Mixed hyperlipidemia: Secondary | ICD-10-CM | POA: Diagnosis not present

## 2021-05-12 DIAGNOSIS — I639 Cerebral infarction, unspecified: Secondary | ICD-10-CM | POA: Diagnosis not present

## 2021-05-12 DIAGNOSIS — M6281 Muscle weakness (generalized): Secondary | ICD-10-CM | POA: Diagnosis not present

## 2021-05-12 DIAGNOSIS — R2681 Unsteadiness on feet: Secondary | ICD-10-CM | POA: Diagnosis not present

## 2021-05-12 DIAGNOSIS — R278 Other lack of coordination: Secondary | ICD-10-CM | POA: Diagnosis not present

## 2021-05-12 DIAGNOSIS — N183 Chronic kidney disease, stage 3 unspecified: Secondary | ICD-10-CM | POA: Diagnosis not present

## 2021-05-12 DIAGNOSIS — E039 Hypothyroidism, unspecified: Secondary | ICD-10-CM | POA: Diagnosis not present

## 2021-05-12 DIAGNOSIS — I10 Essential (primary) hypertension: Secondary | ICD-10-CM | POA: Diagnosis not present

## 2021-05-12 DIAGNOSIS — D68312 Antiphospholipid antibody with hemorrhagic disorder: Secondary | ICD-10-CM | POA: Diagnosis not present

## 2021-05-12 DIAGNOSIS — G9341 Metabolic encephalopathy: Secondary | ICD-10-CM | POA: Diagnosis not present

## 2021-05-12 DIAGNOSIS — N179 Acute kidney failure, unspecified: Secondary | ICD-10-CM | POA: Diagnosis not present

## 2021-05-15 DIAGNOSIS — N179 Acute kidney failure, unspecified: Secondary | ICD-10-CM | POA: Diagnosis not present

## 2021-05-15 DIAGNOSIS — I639 Cerebral infarction, unspecified: Secondary | ICD-10-CM | POA: Diagnosis not present

## 2021-05-15 DIAGNOSIS — R2681 Unsteadiness on feet: Secondary | ICD-10-CM | POA: Diagnosis not present

## 2021-05-15 DIAGNOSIS — M80061A Age-related osteoporosis with current pathological fracture, right lower leg, initial encounter for fracture: Secondary | ICD-10-CM | POA: Diagnosis not present

## 2021-05-15 DIAGNOSIS — G9341 Metabolic encephalopathy: Secondary | ICD-10-CM | POA: Diagnosis not present

## 2021-05-15 DIAGNOSIS — E039 Hypothyroidism, unspecified: Secondary | ICD-10-CM | POA: Diagnosis not present

## 2021-05-15 DIAGNOSIS — I10 Essential (primary) hypertension: Secondary | ICD-10-CM | POA: Diagnosis not present

## 2021-05-15 DIAGNOSIS — M6281 Muscle weakness (generalized): Secondary | ICD-10-CM | POA: Diagnosis not present

## 2021-05-15 DIAGNOSIS — R278 Other lack of coordination: Secondary | ICD-10-CM | POA: Diagnosis not present

## 2021-05-15 DIAGNOSIS — D68312 Antiphospholipid antibody with hemorrhagic disorder: Secondary | ICD-10-CM | POA: Diagnosis not present

## 2021-05-16 DIAGNOSIS — Z7901 Long term (current) use of anticoagulants: Secondary | ICD-10-CM | POA: Diagnosis not present

## 2021-05-16 DIAGNOSIS — M84471D Pathological fracture, right ankle, subsequent encounter for fracture with routine healing: Secondary | ICD-10-CM | POA: Diagnosis not present

## 2021-05-17 DIAGNOSIS — M6281 Muscle weakness (generalized): Secondary | ICD-10-CM | POA: Diagnosis not present

## 2021-05-17 DIAGNOSIS — M80061A Age-related osteoporosis with current pathological fracture, right lower leg, initial encounter for fracture: Secondary | ICD-10-CM | POA: Diagnosis not present

## 2021-05-17 DIAGNOSIS — G9341 Metabolic encephalopathy: Secondary | ICD-10-CM | POA: Diagnosis not present

## 2021-05-17 DIAGNOSIS — I639 Cerebral infarction, unspecified: Secondary | ICD-10-CM | POA: Diagnosis not present

## 2021-05-17 DIAGNOSIS — D68312 Antiphospholipid antibody with hemorrhagic disorder: Secondary | ICD-10-CM | POA: Diagnosis not present

## 2021-05-17 DIAGNOSIS — I10 Essential (primary) hypertension: Secondary | ICD-10-CM | POA: Diagnosis not present

## 2021-05-17 DIAGNOSIS — R278 Other lack of coordination: Secondary | ICD-10-CM | POA: Diagnosis not present

## 2021-05-17 DIAGNOSIS — N179 Acute kidney failure, unspecified: Secondary | ICD-10-CM | POA: Diagnosis not present

## 2021-05-17 DIAGNOSIS — E782 Mixed hyperlipidemia: Secondary | ICD-10-CM | POA: Diagnosis not present

## 2021-05-17 DIAGNOSIS — E039 Hypothyroidism, unspecified: Secondary | ICD-10-CM | POA: Diagnosis not present

## 2021-05-17 DIAGNOSIS — R2681 Unsteadiness on feet: Secondary | ICD-10-CM | POA: Diagnosis not present

## 2021-05-18 DIAGNOSIS — Z8673 Personal history of transient ischemic attack (TIA), and cerebral infarction without residual deficits: Secondary | ICD-10-CM | POA: Diagnosis not present

## 2021-05-18 DIAGNOSIS — S82851D Displaced trimalleolar fracture of right lower leg, subsequent encounter for closed fracture with routine healing: Secondary | ICD-10-CM | POA: Diagnosis not present

## 2021-05-18 DIAGNOSIS — M6281 Muscle weakness (generalized): Secondary | ICD-10-CM | POA: Diagnosis not present

## 2021-05-19 DIAGNOSIS — E782 Mixed hyperlipidemia: Secondary | ICD-10-CM | POA: Diagnosis not present

## 2021-05-19 DIAGNOSIS — D68312 Antiphospholipid antibody with hemorrhagic disorder: Secondary | ICD-10-CM | POA: Diagnosis not present

## 2021-05-19 DIAGNOSIS — N179 Acute kidney failure, unspecified: Secondary | ICD-10-CM | POA: Diagnosis not present

## 2021-05-19 DIAGNOSIS — I639 Cerebral infarction, unspecified: Secondary | ICD-10-CM | POA: Diagnosis not present

## 2021-05-19 DIAGNOSIS — R278 Other lack of coordination: Secondary | ICD-10-CM | POA: Diagnosis not present

## 2021-05-19 DIAGNOSIS — G9341 Metabolic encephalopathy: Secondary | ICD-10-CM | POA: Diagnosis not present

## 2021-05-19 DIAGNOSIS — R2681 Unsteadiness on feet: Secondary | ICD-10-CM | POA: Diagnosis not present

## 2021-05-19 DIAGNOSIS — M6281 Muscle weakness (generalized): Secondary | ICD-10-CM | POA: Diagnosis not present

## 2021-05-19 DIAGNOSIS — I10 Essential (primary) hypertension: Secondary | ICD-10-CM | POA: Diagnosis not present

## 2021-05-19 DIAGNOSIS — M80061A Age-related osteoporosis with current pathological fracture, right lower leg, initial encounter for fracture: Secondary | ICD-10-CM | POA: Diagnosis not present

## 2021-05-19 DIAGNOSIS — E039 Hypothyroidism, unspecified: Secondary | ICD-10-CM | POA: Diagnosis not present

## 2021-05-22 DIAGNOSIS — I10 Essential (primary) hypertension: Secondary | ICD-10-CM | POA: Diagnosis not present

## 2021-05-22 DIAGNOSIS — Z7901 Long term (current) use of anticoagulants: Secondary | ICD-10-CM | POA: Diagnosis not present

## 2021-05-23 DIAGNOSIS — G9341 Metabolic encephalopathy: Secondary | ICD-10-CM | POA: Diagnosis not present

## 2021-05-23 DIAGNOSIS — I639 Cerebral infarction, unspecified: Secondary | ICD-10-CM | POA: Diagnosis not present

## 2021-05-23 DIAGNOSIS — R2681 Unsteadiness on feet: Secondary | ICD-10-CM | POA: Diagnosis not present

## 2021-05-23 DIAGNOSIS — E039 Hypothyroidism, unspecified: Secondary | ICD-10-CM | POA: Diagnosis not present

## 2021-05-23 DIAGNOSIS — N183 Chronic kidney disease, stage 3 unspecified: Secondary | ICD-10-CM | POA: Diagnosis not present

## 2021-05-23 DIAGNOSIS — M6281 Muscle weakness (generalized): Secondary | ICD-10-CM | POA: Diagnosis not present

## 2021-05-23 DIAGNOSIS — D68312 Antiphospholipid antibody with hemorrhagic disorder: Secondary | ICD-10-CM | POA: Diagnosis not present

## 2021-05-23 DIAGNOSIS — I10 Essential (primary) hypertension: Secondary | ICD-10-CM | POA: Diagnosis not present

## 2021-05-23 DIAGNOSIS — R278 Other lack of coordination: Secondary | ICD-10-CM | POA: Diagnosis not present

## 2021-05-23 DIAGNOSIS — N179 Acute kidney failure, unspecified: Secondary | ICD-10-CM | POA: Diagnosis not present

## 2021-05-23 DIAGNOSIS — E782 Mixed hyperlipidemia: Secondary | ICD-10-CM | POA: Diagnosis not present

## 2021-05-23 DIAGNOSIS — M80061A Age-related osteoporosis with current pathological fracture, right lower leg, initial encounter for fracture: Secondary | ICD-10-CM | POA: Diagnosis not present

## 2021-05-25 DIAGNOSIS — F331 Major depressive disorder, recurrent, moderate: Secondary | ICD-10-CM | POA: Diagnosis not present

## 2021-05-25 DIAGNOSIS — D68312 Antiphospholipid antibody with hemorrhagic disorder: Secondary | ICD-10-CM | POA: Diagnosis not present

## 2021-05-25 DIAGNOSIS — E782 Mixed hyperlipidemia: Secondary | ICD-10-CM | POA: Diagnosis not present

## 2021-05-25 DIAGNOSIS — G9341 Metabolic encephalopathy: Secondary | ICD-10-CM | POA: Diagnosis not present

## 2021-05-25 DIAGNOSIS — I639 Cerebral infarction, unspecified: Secondary | ICD-10-CM | POA: Diagnosis not present

## 2021-05-25 DIAGNOSIS — I10 Essential (primary) hypertension: Secondary | ICD-10-CM | POA: Diagnosis not present

## 2021-05-25 DIAGNOSIS — M6281 Muscle weakness (generalized): Secondary | ICD-10-CM | POA: Diagnosis not present

## 2021-05-25 DIAGNOSIS — R2681 Unsteadiness on feet: Secondary | ICD-10-CM | POA: Diagnosis not present

## 2021-05-25 DIAGNOSIS — E039 Hypothyroidism, unspecified: Secondary | ICD-10-CM | POA: Diagnosis not present

## 2021-05-25 DIAGNOSIS — N179 Acute kidney failure, unspecified: Secondary | ICD-10-CM | POA: Diagnosis not present

## 2021-05-25 DIAGNOSIS — F5105 Insomnia due to other mental disorder: Secondary | ICD-10-CM | POA: Diagnosis not present

## 2021-05-25 DIAGNOSIS — M80061A Age-related osteoporosis with current pathological fracture, right lower leg, initial encounter for fracture: Secondary | ICD-10-CM | POA: Diagnosis not present

## 2021-05-25 DIAGNOSIS — F0151 Vascular dementia with behavioral disturbance: Secondary | ICD-10-CM | POA: Diagnosis not present

## 2021-05-25 DIAGNOSIS — N183 Chronic kidney disease, stage 3 unspecified: Secondary | ICD-10-CM | POA: Diagnosis not present

## 2021-05-25 DIAGNOSIS — R278 Other lack of coordination: Secondary | ICD-10-CM | POA: Diagnosis not present

## 2021-05-26 DIAGNOSIS — Z7901 Long term (current) use of anticoagulants: Secondary | ICD-10-CM | POA: Diagnosis not present

## 2021-05-29 DIAGNOSIS — N183 Chronic kidney disease, stage 3 unspecified: Secondary | ICD-10-CM | POA: Diagnosis not present

## 2021-05-29 DIAGNOSIS — E782 Mixed hyperlipidemia: Secondary | ICD-10-CM | POA: Diagnosis not present

## 2021-05-29 DIAGNOSIS — E039 Hypothyroidism, unspecified: Secondary | ICD-10-CM | POA: Diagnosis not present

## 2021-05-29 DIAGNOSIS — R2681 Unsteadiness on feet: Secondary | ICD-10-CM | POA: Diagnosis not present

## 2021-05-29 DIAGNOSIS — D68312 Antiphospholipid antibody with hemorrhagic disorder: Secondary | ICD-10-CM | POA: Diagnosis not present

## 2021-05-29 DIAGNOSIS — N179 Acute kidney failure, unspecified: Secondary | ICD-10-CM | POA: Diagnosis not present

## 2021-05-29 DIAGNOSIS — I639 Cerebral infarction, unspecified: Secondary | ICD-10-CM | POA: Diagnosis not present

## 2021-05-29 DIAGNOSIS — I10 Essential (primary) hypertension: Secondary | ICD-10-CM | POA: Diagnosis not present

## 2021-05-29 DIAGNOSIS — R278 Other lack of coordination: Secondary | ICD-10-CM | POA: Diagnosis not present

## 2021-05-29 DIAGNOSIS — G9341 Metabolic encephalopathy: Secondary | ICD-10-CM | POA: Diagnosis not present

## 2021-05-29 DIAGNOSIS — M80061A Age-related osteoporosis with current pathological fracture, right lower leg, initial encounter for fracture: Secondary | ICD-10-CM | POA: Diagnosis not present

## 2021-05-29 DIAGNOSIS — M6281 Muscle weakness (generalized): Secondary | ICD-10-CM | POA: Diagnosis not present

## 2021-05-30 DIAGNOSIS — Z7901 Long term (current) use of anticoagulants: Secondary | ICD-10-CM | POA: Diagnosis not present

## 2021-05-31 DIAGNOSIS — R278 Other lack of coordination: Secondary | ICD-10-CM | POA: Diagnosis not present

## 2021-05-31 DIAGNOSIS — N183 Chronic kidney disease, stage 3 unspecified: Secondary | ICD-10-CM | POA: Diagnosis not present

## 2021-05-31 DIAGNOSIS — E039 Hypothyroidism, unspecified: Secondary | ICD-10-CM | POA: Diagnosis not present

## 2021-05-31 DIAGNOSIS — E782 Mixed hyperlipidemia: Secondary | ICD-10-CM | POA: Diagnosis not present

## 2021-05-31 DIAGNOSIS — G9341 Metabolic encephalopathy: Secondary | ICD-10-CM | POA: Diagnosis not present

## 2021-05-31 DIAGNOSIS — I639 Cerebral infarction, unspecified: Secondary | ICD-10-CM | POA: Diagnosis not present

## 2021-05-31 DIAGNOSIS — I10 Essential (primary) hypertension: Secondary | ICD-10-CM | POA: Diagnosis not present

## 2021-05-31 DIAGNOSIS — M80061A Age-related osteoporosis with current pathological fracture, right lower leg, initial encounter for fracture: Secondary | ICD-10-CM | POA: Diagnosis not present

## 2021-05-31 DIAGNOSIS — N179 Acute kidney failure, unspecified: Secondary | ICD-10-CM | POA: Diagnosis not present

## 2021-05-31 DIAGNOSIS — R2681 Unsteadiness on feet: Secondary | ICD-10-CM | POA: Diagnosis not present

## 2021-05-31 DIAGNOSIS — D68312 Antiphospholipid antibody with hemorrhagic disorder: Secondary | ICD-10-CM | POA: Diagnosis not present

## 2021-05-31 DIAGNOSIS — M6281 Muscle weakness (generalized): Secondary | ICD-10-CM | POA: Diagnosis not present

## 2021-06-02 DIAGNOSIS — D68312 Antiphospholipid antibody with hemorrhagic disorder: Secondary | ICD-10-CM | POA: Diagnosis not present

## 2021-06-02 DIAGNOSIS — N183 Chronic kidney disease, stage 3 unspecified: Secondary | ICD-10-CM | POA: Diagnosis not present

## 2021-06-02 DIAGNOSIS — E039 Hypothyroidism, unspecified: Secondary | ICD-10-CM | POA: Diagnosis not present

## 2021-06-02 DIAGNOSIS — I10 Essential (primary) hypertension: Secondary | ICD-10-CM | POA: Diagnosis not present

## 2021-06-02 DIAGNOSIS — R278 Other lack of coordination: Secondary | ICD-10-CM | POA: Diagnosis not present

## 2021-06-02 DIAGNOSIS — E782 Mixed hyperlipidemia: Secondary | ICD-10-CM | POA: Diagnosis not present

## 2021-06-02 DIAGNOSIS — R2681 Unsteadiness on feet: Secondary | ICD-10-CM | POA: Diagnosis not present

## 2021-06-02 DIAGNOSIS — I639 Cerebral infarction, unspecified: Secondary | ICD-10-CM | POA: Diagnosis not present

## 2021-06-02 DIAGNOSIS — N179 Acute kidney failure, unspecified: Secondary | ICD-10-CM | POA: Diagnosis not present

## 2021-06-02 DIAGNOSIS — M6281 Muscle weakness (generalized): Secondary | ICD-10-CM | POA: Diagnosis not present

## 2021-06-02 DIAGNOSIS — M80061A Age-related osteoporosis with current pathological fracture, right lower leg, initial encounter for fracture: Secondary | ICD-10-CM | POA: Diagnosis not present

## 2021-06-02 DIAGNOSIS — G9341 Metabolic encephalopathy: Secondary | ICD-10-CM | POA: Diagnosis not present

## 2021-06-06 DIAGNOSIS — N183 Chronic kidney disease, stage 3 unspecified: Secondary | ICD-10-CM | POA: Diagnosis not present

## 2021-06-06 DIAGNOSIS — R2681 Unsteadiness on feet: Secondary | ICD-10-CM | POA: Diagnosis not present

## 2021-06-06 DIAGNOSIS — R278 Other lack of coordination: Secondary | ICD-10-CM | POA: Diagnosis not present

## 2021-06-06 DIAGNOSIS — I639 Cerebral infarction, unspecified: Secondary | ICD-10-CM | POA: Diagnosis not present

## 2021-06-06 DIAGNOSIS — N179 Acute kidney failure, unspecified: Secondary | ICD-10-CM | POA: Diagnosis not present

## 2021-06-06 DIAGNOSIS — G9341 Metabolic encephalopathy: Secondary | ICD-10-CM | POA: Diagnosis not present

## 2021-06-06 DIAGNOSIS — E782 Mixed hyperlipidemia: Secondary | ICD-10-CM | POA: Diagnosis not present

## 2021-06-06 DIAGNOSIS — M6281 Muscle weakness (generalized): Secondary | ICD-10-CM | POA: Diagnosis not present

## 2021-06-06 DIAGNOSIS — D68312 Antiphospholipid antibody with hemorrhagic disorder: Secondary | ICD-10-CM | POA: Diagnosis not present

## 2021-06-06 DIAGNOSIS — M80061A Age-related osteoporosis with current pathological fracture, right lower leg, initial encounter for fracture: Secondary | ICD-10-CM | POA: Diagnosis not present

## 2021-06-06 DIAGNOSIS — I10 Essential (primary) hypertension: Secondary | ICD-10-CM | POA: Diagnosis not present

## 2021-06-06 DIAGNOSIS — E039 Hypothyroidism, unspecified: Secondary | ICD-10-CM | POA: Diagnosis not present

## 2021-06-07 DIAGNOSIS — F411 Generalized anxiety disorder: Secondary | ICD-10-CM | POA: Diagnosis not present

## 2021-06-07 DIAGNOSIS — Z8673 Personal history of transient ischemic attack (TIA), and cerebral infarction without residual deficits: Secondary | ICD-10-CM | POA: Diagnosis not present

## 2021-06-07 DIAGNOSIS — R413 Other amnesia: Secondary | ICD-10-CM | POA: Diagnosis not present

## 2021-06-07 DIAGNOSIS — G479 Sleep disorder, unspecified: Secondary | ICD-10-CM | POA: Diagnosis not present

## 2021-06-07 DIAGNOSIS — R296 Repeated falls: Secondary | ICD-10-CM | POA: Diagnosis not present

## 2021-06-08 DIAGNOSIS — G9341 Metabolic encephalopathy: Secondary | ICD-10-CM | POA: Diagnosis not present

## 2021-06-08 DIAGNOSIS — N179 Acute kidney failure, unspecified: Secondary | ICD-10-CM | POA: Diagnosis not present

## 2021-06-08 DIAGNOSIS — R278 Other lack of coordination: Secondary | ICD-10-CM | POA: Diagnosis not present

## 2021-06-08 DIAGNOSIS — E782 Mixed hyperlipidemia: Secondary | ICD-10-CM | POA: Diagnosis not present

## 2021-06-08 DIAGNOSIS — R2681 Unsteadiness on feet: Secondary | ICD-10-CM | POA: Diagnosis not present

## 2021-06-08 DIAGNOSIS — M80061A Age-related osteoporosis with current pathological fracture, right lower leg, initial encounter for fracture: Secondary | ICD-10-CM | POA: Diagnosis not present

## 2021-06-08 DIAGNOSIS — E039 Hypothyroidism, unspecified: Secondary | ICD-10-CM | POA: Diagnosis not present

## 2021-06-08 DIAGNOSIS — F331 Major depressive disorder, recurrent, moderate: Secondary | ICD-10-CM | POA: Diagnosis not present

## 2021-06-08 DIAGNOSIS — I10 Essential (primary) hypertension: Secondary | ICD-10-CM | POA: Diagnosis not present

## 2021-06-08 DIAGNOSIS — I639 Cerebral infarction, unspecified: Secondary | ICD-10-CM | POA: Diagnosis not present

## 2021-06-08 DIAGNOSIS — M6281 Muscle weakness (generalized): Secondary | ICD-10-CM | POA: Diagnosis not present

## 2021-06-08 DIAGNOSIS — F0151 Vascular dementia with behavioral disturbance: Secondary | ICD-10-CM | POA: Diagnosis not present

## 2021-06-08 DIAGNOSIS — D68312 Antiphospholipid antibody with hemorrhagic disorder: Secondary | ICD-10-CM | POA: Diagnosis not present

## 2021-06-08 DIAGNOSIS — F5105 Insomnia due to other mental disorder: Secondary | ICD-10-CM | POA: Diagnosis not present

## 2021-06-08 DIAGNOSIS — N183 Chronic kidney disease, stage 3 unspecified: Secondary | ICD-10-CM | POA: Diagnosis not present

## 2021-06-09 DIAGNOSIS — Z7901 Long term (current) use of anticoagulants: Secondary | ICD-10-CM | POA: Diagnosis not present

## 2021-06-09 DIAGNOSIS — I10 Essential (primary) hypertension: Secondary | ICD-10-CM | POA: Diagnosis not present

## 2021-06-12 DIAGNOSIS — D68312 Antiphospholipid antibody with hemorrhagic disorder: Secondary | ICD-10-CM | POA: Diagnosis not present

## 2021-06-12 DIAGNOSIS — M6281 Muscle weakness (generalized): Secondary | ICD-10-CM | POA: Diagnosis not present

## 2021-06-12 DIAGNOSIS — R2681 Unsteadiness on feet: Secondary | ICD-10-CM | POA: Diagnosis not present

## 2021-06-12 DIAGNOSIS — N179 Acute kidney failure, unspecified: Secondary | ICD-10-CM | POA: Diagnosis not present

## 2021-06-12 DIAGNOSIS — E039 Hypothyroidism, unspecified: Secondary | ICD-10-CM | POA: Diagnosis not present

## 2021-06-12 DIAGNOSIS — G9341 Metabolic encephalopathy: Secondary | ICD-10-CM | POA: Diagnosis not present

## 2021-06-12 DIAGNOSIS — E782 Mixed hyperlipidemia: Secondary | ICD-10-CM | POA: Diagnosis not present

## 2021-06-12 DIAGNOSIS — I639 Cerebral infarction, unspecified: Secondary | ICD-10-CM | POA: Diagnosis not present

## 2021-06-12 DIAGNOSIS — I10 Essential (primary) hypertension: Secondary | ICD-10-CM | POA: Diagnosis not present

## 2021-06-12 DIAGNOSIS — M80061A Age-related osteoporosis with current pathological fracture, right lower leg, initial encounter for fracture: Secondary | ICD-10-CM | POA: Diagnosis not present

## 2021-06-12 DIAGNOSIS — R278 Other lack of coordination: Secondary | ICD-10-CM | POA: Diagnosis not present

## 2021-06-12 DIAGNOSIS — N183 Chronic kidney disease, stage 3 unspecified: Secondary | ICD-10-CM | POA: Diagnosis not present

## 2021-06-13 DIAGNOSIS — Z7901 Long term (current) use of anticoagulants: Secondary | ICD-10-CM | POA: Diagnosis not present

## 2021-06-14 DIAGNOSIS — D68312 Antiphospholipid antibody with hemorrhagic disorder: Secondary | ICD-10-CM | POA: Diagnosis not present

## 2021-06-14 DIAGNOSIS — M6281 Muscle weakness (generalized): Secondary | ICD-10-CM | POA: Diagnosis not present

## 2021-06-14 DIAGNOSIS — M80061A Age-related osteoporosis with current pathological fracture, right lower leg, initial encounter for fracture: Secondary | ICD-10-CM | POA: Diagnosis not present

## 2021-06-14 DIAGNOSIS — E782 Mixed hyperlipidemia: Secondary | ICD-10-CM | POA: Diagnosis not present

## 2021-06-14 DIAGNOSIS — R2681 Unsteadiness on feet: Secondary | ICD-10-CM | POA: Diagnosis not present

## 2021-06-14 DIAGNOSIS — I639 Cerebral infarction, unspecified: Secondary | ICD-10-CM | POA: Diagnosis not present

## 2021-06-14 DIAGNOSIS — R4182 Altered mental status, unspecified: Secondary | ICD-10-CM | POA: Diagnosis not present

## 2021-06-14 DIAGNOSIS — R278 Other lack of coordination: Secondary | ICD-10-CM | POA: Diagnosis not present

## 2021-06-14 DIAGNOSIS — Z7901 Long term (current) use of anticoagulants: Secondary | ICD-10-CM | POA: Diagnosis not present

## 2021-06-14 DIAGNOSIS — N183 Chronic kidney disease, stage 3 unspecified: Secondary | ICD-10-CM | POA: Diagnosis not present

## 2021-06-14 DIAGNOSIS — E039 Hypothyroidism, unspecified: Secondary | ICD-10-CM | POA: Diagnosis not present

## 2021-06-14 DIAGNOSIS — G9341 Metabolic encephalopathy: Secondary | ICD-10-CM | POA: Diagnosis not present

## 2021-06-14 DIAGNOSIS — N179 Acute kidney failure, unspecified: Secondary | ICD-10-CM | POA: Diagnosis not present

## 2021-06-14 DIAGNOSIS — I10 Essential (primary) hypertension: Secondary | ICD-10-CM | POA: Diagnosis not present

## 2021-06-16 DIAGNOSIS — I10 Essential (primary) hypertension: Secondary | ICD-10-CM | POA: Diagnosis not present

## 2021-06-16 DIAGNOSIS — M80061A Age-related osteoporosis with current pathological fracture, right lower leg, initial encounter for fracture: Secondary | ICD-10-CM | POA: Diagnosis not present

## 2021-06-16 DIAGNOSIS — D68312 Antiphospholipid antibody with hemorrhagic disorder: Secondary | ICD-10-CM | POA: Diagnosis not present

## 2021-06-16 DIAGNOSIS — E039 Hypothyroidism, unspecified: Secondary | ICD-10-CM | POA: Diagnosis not present

## 2021-06-16 DIAGNOSIS — R278 Other lack of coordination: Secondary | ICD-10-CM | POA: Diagnosis not present

## 2021-06-16 DIAGNOSIS — I639 Cerebral infarction, unspecified: Secondary | ICD-10-CM | POA: Diagnosis not present

## 2021-06-16 DIAGNOSIS — G9341 Metabolic encephalopathy: Secondary | ICD-10-CM | POA: Diagnosis not present

## 2021-06-16 DIAGNOSIS — N179 Acute kidney failure, unspecified: Secondary | ICD-10-CM | POA: Diagnosis not present

## 2021-06-16 DIAGNOSIS — N183 Chronic kidney disease, stage 3 unspecified: Secondary | ICD-10-CM | POA: Diagnosis not present

## 2021-06-16 DIAGNOSIS — R2681 Unsteadiness on feet: Secondary | ICD-10-CM | POA: Diagnosis not present

## 2021-06-16 DIAGNOSIS — M6281 Muscle weakness (generalized): Secondary | ICD-10-CM | POA: Diagnosis not present

## 2021-06-16 DIAGNOSIS — E782 Mixed hyperlipidemia: Secondary | ICD-10-CM | POA: Diagnosis not present

## 2021-06-19 DIAGNOSIS — Z8673 Personal history of transient ischemic attack (TIA), and cerebral infarction without residual deficits: Secondary | ICD-10-CM | POA: Diagnosis not present

## 2021-06-19 DIAGNOSIS — N390 Urinary tract infection, site not specified: Secondary | ICD-10-CM | POA: Diagnosis not present

## 2021-06-19 DIAGNOSIS — S82851D Displaced trimalleolar fracture of right lower leg, subsequent encounter for closed fracture with routine healing: Secondary | ICD-10-CM | POA: Diagnosis not present

## 2021-06-19 DIAGNOSIS — Z7901 Long term (current) use of anticoagulants: Secondary | ICD-10-CM | POA: Diagnosis not present

## 2021-06-22 DIAGNOSIS — N179 Acute kidney failure, unspecified: Secondary | ICD-10-CM | POA: Diagnosis not present

## 2021-06-22 DIAGNOSIS — D68312 Antiphospholipid antibody with hemorrhagic disorder: Secondary | ICD-10-CM | POA: Diagnosis not present

## 2021-06-22 DIAGNOSIS — E039 Hypothyroidism, unspecified: Secondary | ICD-10-CM | POA: Diagnosis not present

## 2021-06-22 DIAGNOSIS — R2681 Unsteadiness on feet: Secondary | ICD-10-CM | POA: Diagnosis not present

## 2021-06-22 DIAGNOSIS — N183 Chronic kidney disease, stage 3 unspecified: Secondary | ICD-10-CM | POA: Diagnosis not present

## 2021-06-22 DIAGNOSIS — E782 Mixed hyperlipidemia: Secondary | ICD-10-CM | POA: Diagnosis not present

## 2021-06-22 DIAGNOSIS — M80061A Age-related osteoporosis with current pathological fracture, right lower leg, initial encounter for fracture: Secondary | ICD-10-CM | POA: Diagnosis not present

## 2021-06-22 DIAGNOSIS — I10 Essential (primary) hypertension: Secondary | ICD-10-CM | POA: Diagnosis not present

## 2021-06-22 DIAGNOSIS — G9341 Metabolic encephalopathy: Secondary | ICD-10-CM | POA: Diagnosis not present

## 2021-06-22 DIAGNOSIS — I639 Cerebral infarction, unspecified: Secondary | ICD-10-CM | POA: Diagnosis not present

## 2021-06-22 DIAGNOSIS — M6281 Muscle weakness (generalized): Secondary | ICD-10-CM | POA: Diagnosis not present

## 2021-06-22 DIAGNOSIS — R278 Other lack of coordination: Secondary | ICD-10-CM | POA: Diagnosis not present

## 2021-06-26 DIAGNOSIS — I639 Cerebral infarction, unspecified: Secondary | ICD-10-CM | POA: Diagnosis not present

## 2021-06-26 DIAGNOSIS — I10 Essential (primary) hypertension: Secondary | ICD-10-CM | POA: Diagnosis not present

## 2021-06-26 DIAGNOSIS — N183 Chronic kidney disease, stage 3 unspecified: Secondary | ICD-10-CM | POA: Diagnosis not present

## 2021-06-26 DIAGNOSIS — D68312 Antiphospholipid antibody with hemorrhagic disorder: Secondary | ICD-10-CM | POA: Diagnosis not present

## 2021-06-26 DIAGNOSIS — G9341 Metabolic encephalopathy: Secondary | ICD-10-CM | POA: Diagnosis not present

## 2021-06-26 DIAGNOSIS — R278 Other lack of coordination: Secondary | ICD-10-CM | POA: Diagnosis not present

## 2021-06-26 DIAGNOSIS — R2681 Unsteadiness on feet: Secondary | ICD-10-CM | POA: Diagnosis not present

## 2021-06-26 DIAGNOSIS — E782 Mixed hyperlipidemia: Secondary | ICD-10-CM | POA: Diagnosis not present

## 2021-06-26 DIAGNOSIS — N179 Acute kidney failure, unspecified: Secondary | ICD-10-CM | POA: Diagnosis not present

## 2021-06-26 DIAGNOSIS — M6281 Muscle weakness (generalized): Secondary | ICD-10-CM | POA: Diagnosis not present

## 2021-06-26 DIAGNOSIS — M80061A Age-related osteoporosis with current pathological fracture, right lower leg, initial encounter for fracture: Secondary | ICD-10-CM | POA: Diagnosis not present

## 2021-06-26 DIAGNOSIS — E039 Hypothyroidism, unspecified: Secondary | ICD-10-CM | POA: Diagnosis not present

## 2021-06-27 DIAGNOSIS — Z7901 Long term (current) use of anticoagulants: Secondary | ICD-10-CM | POA: Diagnosis not present

## 2021-06-27 DIAGNOSIS — N390 Urinary tract infection, site not specified: Secondary | ICD-10-CM | POA: Diagnosis not present

## 2021-07-03 DIAGNOSIS — Z03818 Encounter for observation for suspected exposure to other biological agents ruled out: Secondary | ICD-10-CM | POA: Diagnosis not present

## 2021-07-04 DIAGNOSIS — B351 Tinea unguium: Secondary | ICD-10-CM | POA: Diagnosis not present

## 2021-07-04 DIAGNOSIS — Z20822 Contact with and (suspected) exposure to covid-19: Secondary | ICD-10-CM | POA: Diagnosis not present

## 2021-07-04 DIAGNOSIS — L603 Nail dystrophy: Secondary | ICD-10-CM | POA: Diagnosis not present

## 2021-07-04 DIAGNOSIS — G629 Polyneuropathy, unspecified: Secondary | ICD-10-CM | POA: Diagnosis not present

## 2021-07-06 DIAGNOSIS — H527 Unspecified disorder of refraction: Secondary | ICD-10-CM | POA: Diagnosis not present

## 2021-07-06 DIAGNOSIS — F331 Major depressive disorder, recurrent, moderate: Secondary | ICD-10-CM | POA: Diagnosis not present

## 2021-07-06 DIAGNOSIS — H2513 Age-related nuclear cataract, bilateral: Secondary | ICD-10-CM | POA: Diagnosis not present

## 2021-07-06 DIAGNOSIS — F5105 Insomnia due to other mental disorder: Secondary | ICD-10-CM | POA: Diagnosis not present

## 2021-07-06 DIAGNOSIS — Z03818 Encounter for observation for suspected exposure to other biological agents ruled out: Secondary | ICD-10-CM | POA: Diagnosis not present

## 2021-07-06 DIAGNOSIS — D6832 Hemorrhagic disorder due to extrinsic circulating anticoagulants: Secondary | ICD-10-CM | POA: Diagnosis not present

## 2021-07-06 DIAGNOSIS — Z8673 Personal history of transient ischemic attack (TIA), and cerebral infarction without residual deficits: Secondary | ICD-10-CM | POA: Diagnosis not present

## 2021-07-06 DIAGNOSIS — I10 Essential (primary) hypertension: Secondary | ICD-10-CM | POA: Diagnosis not present

## 2021-07-06 DIAGNOSIS — F0151 Vascular dementia with behavioral disturbance: Secondary | ICD-10-CM | POA: Diagnosis not present

## 2021-07-06 DIAGNOSIS — R531 Weakness: Secondary | ICD-10-CM | POA: Diagnosis not present

## 2021-07-07 DIAGNOSIS — Z719 Counseling, unspecified: Secondary | ICD-10-CM | POA: Diagnosis not present

## 2021-07-07 DIAGNOSIS — Z7901 Long term (current) use of anticoagulants: Secondary | ICD-10-CM | POA: Diagnosis not present

## 2021-07-10 DIAGNOSIS — F331 Major depressive disorder, recurrent, moderate: Secondary | ICD-10-CM | POA: Diagnosis not present

## 2021-07-10 DIAGNOSIS — R531 Weakness: Secondary | ICD-10-CM | POA: Diagnosis not present

## 2021-07-10 DIAGNOSIS — D6832 Hemorrhagic disorder due to extrinsic circulating anticoagulants: Secondary | ICD-10-CM | POA: Diagnosis not present

## 2021-07-10 DIAGNOSIS — Z03818 Encounter for observation for suspected exposure to other biological agents ruled out: Secondary | ICD-10-CM | POA: Diagnosis not present

## 2021-07-10 DIAGNOSIS — F419 Anxiety disorder, unspecified: Secondary | ICD-10-CM | POA: Diagnosis not present

## 2021-07-11 DIAGNOSIS — I10 Essential (primary) hypertension: Secondary | ICD-10-CM | POA: Diagnosis not present

## 2021-07-11 DIAGNOSIS — Z7901 Long term (current) use of anticoagulants: Secondary | ICD-10-CM | POA: Diagnosis not present

## 2021-07-13 DIAGNOSIS — D6832 Hemorrhagic disorder due to extrinsic circulating anticoagulants: Secondary | ICD-10-CM | POA: Diagnosis not present

## 2021-07-17 ENCOUNTER — Telehealth: Payer: Self-pay

## 2021-07-17 ENCOUNTER — Other Ambulatory Visit: Payer: Self-pay

## 2021-07-17 DIAGNOSIS — Z9181 History of falling: Secondary | ICD-10-CM | POA: Diagnosis not present

## 2021-07-17 DIAGNOSIS — D6832 Hemorrhagic disorder due to extrinsic circulating anticoagulants: Secondary | ICD-10-CM | POA: Diagnosis not present

## 2021-07-17 DIAGNOSIS — Z03818 Encounter for observation for suspected exposure to other biological agents ruled out: Secondary | ICD-10-CM | POA: Diagnosis not present

## 2021-07-17 DIAGNOSIS — R531 Weakness: Secondary | ICD-10-CM | POA: Diagnosis not present

## 2021-07-17 DIAGNOSIS — I82401 Acute embolism and thrombosis of unspecified deep veins of right lower extremity: Secondary | ICD-10-CM | POA: Diagnosis not present

## 2021-07-17 DIAGNOSIS — N289 Disorder of kidney and ureter, unspecified: Secondary | ICD-10-CM

## 2021-07-17 DIAGNOSIS — N183 Chronic kidney disease, stage 3 unspecified: Secondary | ICD-10-CM

## 2021-07-17 NOTE — Telephone Encounter (Signed)
Copied from [place] [phone]. Topic: Referral - Request for Referral >> Jul 17, 2021  3:36 PM Alanda Slim E wrote: Has patient seen PCP for this complaint? Yes  *If NO, is insurance requiring patient see PCP for this issue before PCP can refer them? Referral for which specialty: nephrologist  Preferred provider/office: somewhere in [facility] or [place]  Reason for referral: she has one in [place] but is in a long term care facility in [place] and they cant transport to the [place] office / Pt has an issue with her kidney function

## 2021-07-17 NOTE — Progress Notes (Signed)
Ref to [place] placed

## 2021-07-18 DIAGNOSIS — I82401 Acute embolism and thrombosis of unspecified deep veins of right lower extremity: Secondary | ICD-10-CM | POA: Diagnosis not present

## 2021-07-18 DIAGNOSIS — F32A Depression, unspecified: Secondary | ICD-10-CM | POA: Diagnosis not present

## 2021-07-18 DIAGNOSIS — I10 Essential (primary) hypertension: Secondary | ICD-10-CM | POA: Diagnosis not present

## 2021-07-18 DIAGNOSIS — E46 Unspecified protein-calorie malnutrition: Secondary | ICD-10-CM | POA: Diagnosis not present

## 2021-07-20 ENCOUNTER — Telehealth: Payer: Self-pay

## 2021-07-20 DIAGNOSIS — R278 Other lack of coordination: Secondary | ICD-10-CM | POA: Diagnosis not present

## 2021-07-20 DIAGNOSIS — M6281 Muscle weakness (generalized): Secondary | ICD-10-CM | POA: Diagnosis not present

## 2021-07-20 DIAGNOSIS — R262 Difficulty in walking, not elsewhere classified: Secondary | ICD-10-CM | POA: Diagnosis not present

## 2021-07-20 DIAGNOSIS — D6832 Hemorrhagic disorder due to extrinsic circulating anticoagulants: Secondary | ICD-10-CM | POA: Diagnosis not present

## 2021-07-20 DIAGNOSIS — R531 Weakness: Secondary | ICD-10-CM | POA: Diagnosis not present

## 2021-07-20 DIAGNOSIS — G9341 Metabolic encephalopathy: Secondary | ICD-10-CM | POA: Diagnosis not present

## 2021-07-20 DIAGNOSIS — R41841 Cognitive communication deficit: Secondary | ICD-10-CM | POA: Diagnosis not present

## 2021-07-20 DIAGNOSIS — R1312 Dysphagia, oropharyngeal phase: Secondary | ICD-10-CM | POA: Diagnosis not present

## 2021-07-20 NOTE — Telephone Encounter (Signed)
Copied from [place] [phone]. Topic: General - Other >> Jul 20, 2021  1:39 PM Yvette Rack wrote: Reason for CRM: Guerry Bruin with [facility] reports that they have been unable to reach pt to schedule appt.

## 2021-07-21 DIAGNOSIS — M6281 Muscle weakness (generalized): Secondary | ICD-10-CM | POA: Diagnosis not present

## 2021-07-21 DIAGNOSIS — I10 Essential (primary) hypertension: Secondary | ICD-10-CM | POA: Diagnosis not present

## 2021-07-21 DIAGNOSIS — E782 Mixed hyperlipidemia: Secondary | ICD-10-CM | POA: Diagnosis not present

## 2021-07-21 DIAGNOSIS — E039 Hypothyroidism, unspecified: Secondary | ICD-10-CM | POA: Diagnosis not present

## 2021-07-21 DIAGNOSIS — M80061A Age-related osteoporosis with current pathological fracture, right lower leg, initial encounter for fracture: Secondary | ICD-10-CM | POA: Diagnosis not present

## 2021-07-21 DIAGNOSIS — G9341 Metabolic encephalopathy: Secondary | ICD-10-CM | POA: Diagnosis not present

## 2021-07-21 DIAGNOSIS — I159 Secondary hypertension, unspecified: Secondary | ICD-10-CM | POA: Diagnosis not present

## 2021-07-21 DIAGNOSIS — R278 Other lack of coordination: Secondary | ICD-10-CM | POA: Diagnosis not present

## 2021-07-21 DIAGNOSIS — I82401 Acute embolism and thrombosis of unspecified deep veins of right lower extremity: Secondary | ICD-10-CM | POA: Diagnosis not present

## 2021-07-21 DIAGNOSIS — D68312 Antiphospholipid antibody with hemorrhagic disorder: Secondary | ICD-10-CM | POA: Diagnosis not present

## 2021-07-21 DIAGNOSIS — N179 Acute kidney failure, unspecified: Secondary | ICD-10-CM | POA: Diagnosis not present

## 2021-07-21 DIAGNOSIS — F329 Major depressive disorder, single episode, unspecified: Secondary | ICD-10-CM | POA: Diagnosis not present

## 2021-07-21 DIAGNOSIS — I639 Cerebral infarction, unspecified: Secondary | ICD-10-CM | POA: Diagnosis not present

## 2021-07-21 DIAGNOSIS — R2681 Unsteadiness on feet: Secondary | ICD-10-CM | POA: Diagnosis not present

## 2021-07-24 DIAGNOSIS — D6832 Hemorrhagic disorder due to extrinsic circulating anticoagulants: Secondary | ICD-10-CM | POA: Diagnosis not present

## 2021-07-24 DIAGNOSIS — R531 Weakness: Secondary | ICD-10-CM | POA: Diagnosis not present

## 2021-07-24 DIAGNOSIS — Z03818 Encounter for observation for suspected exposure to other biological agents ruled out: Secondary | ICD-10-CM | POA: Diagnosis not present

## 2021-07-25 DIAGNOSIS — F329 Major depressive disorder, single episode, unspecified: Secondary | ICD-10-CM | POA: Diagnosis not present

## 2021-07-25 DIAGNOSIS — R2681 Unsteadiness on feet: Secondary | ICD-10-CM | POA: Diagnosis not present

## 2021-07-25 DIAGNOSIS — I10 Essential (primary) hypertension: Secondary | ICD-10-CM | POA: Diagnosis not present

## 2021-07-25 DIAGNOSIS — I159 Secondary hypertension, unspecified: Secondary | ICD-10-CM | POA: Diagnosis not present

## 2021-07-25 DIAGNOSIS — M80061A Age-related osteoporosis with current pathological fracture, right lower leg, initial encounter for fracture: Secondary | ICD-10-CM | POA: Diagnosis not present

## 2021-07-25 DIAGNOSIS — I639 Cerebral infarction, unspecified: Secondary | ICD-10-CM | POA: Diagnosis not present

## 2021-07-25 DIAGNOSIS — I82401 Acute embolism and thrombosis of unspecified deep veins of right lower extremity: Secondary | ICD-10-CM | POA: Diagnosis not present

## 2021-07-25 DIAGNOSIS — E782 Mixed hyperlipidemia: Secondary | ICD-10-CM | POA: Diagnosis not present

## 2021-07-25 DIAGNOSIS — R278 Other lack of coordination: Secondary | ICD-10-CM | POA: Diagnosis not present

## 2021-07-25 DIAGNOSIS — D68312 Antiphospholipid antibody with hemorrhagic disorder: Secondary | ICD-10-CM | POA: Diagnosis not present

## 2021-07-25 DIAGNOSIS — G9341 Metabolic encephalopathy: Secondary | ICD-10-CM | POA: Diagnosis not present

## 2021-07-25 DIAGNOSIS — N179 Acute kidney failure, unspecified: Secondary | ICD-10-CM | POA: Diagnosis not present

## 2021-07-25 DIAGNOSIS — M6281 Muscle weakness (generalized): Secondary | ICD-10-CM | POA: Diagnosis not present

## 2021-07-25 DIAGNOSIS — E039 Hypothyroidism, unspecified: Secondary | ICD-10-CM | POA: Diagnosis not present

## 2021-07-27 ENCOUNTER — Telehealth: Payer: Self-pay

## 2021-07-27 DIAGNOSIS — Z03818 Encounter for observation for suspected exposure to other biological agents ruled out: Secondary | ICD-10-CM | POA: Diagnosis not present

## 2021-07-27 DIAGNOSIS — S82851D Displaced trimalleolar fracture of right lower leg, subsequent encounter for closed fracture with routine healing: Secondary | ICD-10-CM | POA: Diagnosis not present

## 2021-07-27 DIAGNOSIS — Z1159 Encounter for screening for other viral diseases: Secondary | ICD-10-CM | POA: Diagnosis not present

## 2021-07-27 DIAGNOSIS — Z8673 Personal history of transient ischemic attack (TIA), and cerebral infarction without residual deficits: Secondary | ICD-10-CM | POA: Diagnosis not present

## 2021-07-27 DIAGNOSIS — D6832 Hemorrhagic disorder due to extrinsic circulating anticoagulants: Secondary | ICD-10-CM | POA: Diagnosis not present

## 2021-07-27 NOTE — Telephone Encounter (Signed)
Copied from [place] [phone]. Topic: General - Other >> Jul 27, 2021  3:31 PM Tessa Lerner A wrote: Reason for CRM: Patient's daughter called to share that they have been in contact with [facility] and the patient needs Referral [id number] resubmitted to their office via fax when possible  The referral was erroneously closed due to miscommunication and they will be unable to see the patient without it  Please contact further if needed

## 2021-07-28 ENCOUNTER — Telehealth: Payer: Self-pay

## 2021-07-28 DIAGNOSIS — I159 Secondary hypertension, unspecified: Secondary | ICD-10-CM | POA: Diagnosis not present

## 2021-07-28 DIAGNOSIS — I82401 Acute embolism and thrombosis of unspecified deep veins of right lower extremity: Secondary | ICD-10-CM | POA: Diagnosis not present

## 2021-07-28 NOTE — Telephone Encounter (Signed)
09/04/21 @ 9:30 with Dr Roosevelt Locks at [facility]

## 2021-07-31 DIAGNOSIS — F419 Anxiety disorder, unspecified: Secondary | ICD-10-CM | POA: Diagnosis not present

## 2021-07-31 DIAGNOSIS — D6832 Hemorrhagic disorder due to extrinsic circulating anticoagulants: Secondary | ICD-10-CM | POA: Diagnosis not present

## 2021-07-31 DIAGNOSIS — F331 Major depressive disorder, recurrent, moderate: Secondary | ICD-10-CM | POA: Diagnosis not present

## 2021-08-02 DIAGNOSIS — E039 Hypothyroidism, unspecified: Secondary | ICD-10-CM | POA: Diagnosis not present

## 2021-08-02 DIAGNOSIS — D649 Anemia, unspecified: Secondary | ICD-10-CM | POA: Diagnosis not present

## 2021-08-02 DIAGNOSIS — I10 Essential (primary) hypertension: Secondary | ICD-10-CM | POA: Diagnosis not present

## 2021-08-02 DIAGNOSIS — I639 Cerebral infarction, unspecified: Secondary | ICD-10-CM | POA: Diagnosis not present

## 2021-08-02 DIAGNOSIS — I2510 Atherosclerotic heart disease of native coronary artery without angina pectoris: Secondary | ICD-10-CM | POA: Diagnosis not present

## 2021-08-02 DIAGNOSIS — Z515 Encounter for palliative care: Secondary | ICD-10-CM | POA: Diagnosis not present

## 2021-08-02 DIAGNOSIS — H547 Unspecified visual loss: Secondary | ICD-10-CM | POA: Diagnosis not present

## 2021-08-02 DIAGNOSIS — Z66 Do not resuscitate: Secondary | ICD-10-CM | POA: Diagnosis not present

## 2021-08-02 DIAGNOSIS — I69315 Cognitive social or emotional deficit following cerebral infarction: Secondary | ICD-10-CM | POA: Diagnosis not present

## 2021-08-02 DIAGNOSIS — I679 Cerebrovascular disease, unspecified: Secondary | ICD-10-CM | POA: Diagnosis not present

## 2021-08-02 DIAGNOSIS — Z9181 History of falling: Secondary | ICD-10-CM | POA: Diagnosis not present

## 2021-08-02 DIAGNOSIS — M810 Age-related osteoporosis without current pathological fracture: Secondary | ICD-10-CM | POA: Diagnosis not present

## 2021-08-02 DIAGNOSIS — E785 Hyperlipidemia, unspecified: Secondary | ICD-10-CM | POA: Diagnosis not present

## 2021-08-02 DIAGNOSIS — R4182 Altered mental status, unspecified: Secondary | ICD-10-CM | POA: Diagnosis not present

## 2021-08-02 DIAGNOSIS — F329 Major depressive disorder, single episode, unspecified: Secondary | ICD-10-CM | POA: Diagnosis not present

## 2021-08-03 DIAGNOSIS — H547 Unspecified visual loss: Secondary | ICD-10-CM | POA: Diagnosis not present

## 2021-08-03 DIAGNOSIS — D6832 Hemorrhagic disorder due to extrinsic circulating anticoagulants: Secondary | ICD-10-CM | POA: Diagnosis not present

## 2021-08-03 DIAGNOSIS — Z9181 History of falling: Secondary | ICD-10-CM | POA: Diagnosis not present

## 2021-08-03 DIAGNOSIS — Z515 Encounter for palliative care: Secondary | ICD-10-CM | POA: Diagnosis not present

## 2021-08-03 DIAGNOSIS — M6281 Muscle weakness (generalized): Secondary | ICD-10-CM | POA: Diagnosis not present

## 2021-08-03 DIAGNOSIS — R262 Difficulty in walking, not elsewhere classified: Secondary | ICD-10-CM | POA: Diagnosis not present

## 2021-08-03 DIAGNOSIS — R4182 Altered mental status, unspecified: Secondary | ICD-10-CM | POA: Diagnosis not present

## 2021-08-03 DIAGNOSIS — R278 Other lack of coordination: Secondary | ICD-10-CM | POA: Diagnosis not present

## 2021-08-03 DIAGNOSIS — D649 Anemia, unspecified: Secondary | ICD-10-CM | POA: Diagnosis not present

## 2021-08-03 DIAGNOSIS — I2510 Atherosclerotic heart disease of native coronary artery without angina pectoris: Secondary | ICD-10-CM | POA: Diagnosis not present

## 2021-08-03 DIAGNOSIS — Z66 Do not resuscitate: Secondary | ICD-10-CM | POA: Diagnosis not present

## 2021-08-03 DIAGNOSIS — E039 Hypothyroidism, unspecified: Secondary | ICD-10-CM | POA: Diagnosis not present

## 2021-08-03 DIAGNOSIS — I10 Essential (primary) hypertension: Secondary | ICD-10-CM | POA: Diagnosis not present

## 2021-08-03 DIAGNOSIS — R41841 Cognitive communication deficit: Secondary | ICD-10-CM | POA: Diagnosis not present

## 2021-08-03 DIAGNOSIS — I679 Cerebrovascular disease, unspecified: Secondary | ICD-10-CM | POA: Diagnosis not present

## 2021-08-03 DIAGNOSIS — G9341 Metabolic encephalopathy: Secondary | ICD-10-CM | POA: Diagnosis not present

## 2021-08-03 DIAGNOSIS — M810 Age-related osteoporosis without current pathological fracture: Secondary | ICD-10-CM | POA: Diagnosis not present

## 2021-08-03 DIAGNOSIS — I69315 Cognitive social or emotional deficit following cerebral infarction: Secondary | ICD-10-CM | POA: Diagnosis not present

## 2021-08-03 DIAGNOSIS — R1312 Dysphagia, oropharyngeal phase: Secondary | ICD-10-CM | POA: Diagnosis not present

## 2021-08-04 DIAGNOSIS — I82401 Acute embolism and thrombosis of unspecified deep veins of right lower extremity: Secondary | ICD-10-CM | POA: Diagnosis not present

## 2021-08-04 DIAGNOSIS — M533 Sacrococcygeal disorders, not elsewhere classified: Secondary | ICD-10-CM | POA: Diagnosis not present

## 2021-08-04 DIAGNOSIS — M546 Pain in thoracic spine: Secondary | ICD-10-CM | POA: Diagnosis not present

## 2021-08-04 DIAGNOSIS — I10 Essential (primary) hypertension: Secondary | ICD-10-CM | POA: Diagnosis not present

## 2021-08-04 DIAGNOSIS — E039 Hypothyroidism, unspecified: Secondary | ICD-10-CM | POA: Diagnosis not present

## 2021-08-04 DIAGNOSIS — M545 Low back pain, unspecified: Secondary | ICD-10-CM | POA: Diagnosis not present

## 2021-08-08 DIAGNOSIS — Z03818 Encounter for observation for suspected exposure to other biological agents ruled out: Secondary | ICD-10-CM | POA: Diagnosis not present

## 2021-08-08 DIAGNOSIS — R531 Weakness: Secondary | ICD-10-CM | POA: Diagnosis not present

## 2021-08-09 DIAGNOSIS — R2681 Unsteadiness on feet: Secondary | ICD-10-CM | POA: Diagnosis not present

## 2021-08-09 DIAGNOSIS — M6281 Muscle weakness (generalized): Secondary | ICD-10-CM | POA: Diagnosis not present

## 2021-08-09 DIAGNOSIS — N179 Acute kidney failure, unspecified: Secondary | ICD-10-CM | POA: Diagnosis not present

## 2021-08-09 DIAGNOSIS — D68312 Antiphospholipid antibody with hemorrhagic disorder: Secondary | ICD-10-CM | POA: Diagnosis not present

## 2021-08-09 DIAGNOSIS — G9341 Metabolic encephalopathy: Secondary | ICD-10-CM | POA: Diagnosis not present

## 2021-08-09 DIAGNOSIS — R278 Other lack of coordination: Secondary | ICD-10-CM | POA: Diagnosis not present

## 2021-08-09 DIAGNOSIS — I639 Cerebral infarction, unspecified: Secondary | ICD-10-CM | POA: Diagnosis not present

## 2021-08-09 DIAGNOSIS — M80061A Age-related osteoporosis with current pathological fracture, right lower leg, initial encounter for fracture: Secondary | ICD-10-CM | POA: Diagnosis not present

## 2021-08-09 DIAGNOSIS — I10 Essential (primary) hypertension: Secondary | ICD-10-CM | POA: Diagnosis not present

## 2021-08-09 DIAGNOSIS — F329 Major depressive disorder, single episode, unspecified: Secondary | ICD-10-CM | POA: Diagnosis not present

## 2021-08-09 DIAGNOSIS — E782 Mixed hyperlipidemia: Secondary | ICD-10-CM | POA: Diagnosis not present

## 2021-08-09 DIAGNOSIS — E039 Hypothyroidism, unspecified: Secondary | ICD-10-CM | POA: Diagnosis not present

## 2021-08-10 DIAGNOSIS — Z03818 Encounter for observation for suspected exposure to other biological agents ruled out: Secondary | ICD-10-CM | POA: Diagnosis not present

## 2021-08-10 DIAGNOSIS — R531 Weakness: Secondary | ICD-10-CM | POA: Diagnosis not present

## 2021-08-12 DIAGNOSIS — Z7901 Long term (current) use of anticoagulants: Secondary | ICD-10-CM | POA: Diagnosis not present

## 2021-08-13 ENCOUNTER — Telehealth: Payer: Self-pay | Admitting: *Deleted

## 2021-08-13 NOTE — Telephone Encounter (Signed)
Staff called perferred number on file to schedule AWV/complete AWV with patient. Staff was not able to speak with patient. Patient daughter Deidre Ala picked up phone and stated patient is not able to talk right now or won't be able to come to the phone for this call because patient is in a long care facility. Staff informed Deidre Ala that when patient returns home or when they have the chance, to please call office for patient to do her AWV. Deidre Ala verbalized understanding and agreed.

## 2021-08-14 DIAGNOSIS — I82401 Acute embolism and thrombosis of unspecified deep veins of right lower extremity: Secondary | ICD-10-CM | POA: Diagnosis not present

## 2021-08-14 DIAGNOSIS — U071 COVID-19: Secondary | ICD-10-CM | POA: Diagnosis not present

## 2021-08-14 DIAGNOSIS — D6832 Hemorrhagic disorder due to extrinsic circulating anticoagulants: Secondary | ICD-10-CM | POA: Diagnosis not present

## 2021-08-15 DIAGNOSIS — I82401 Acute embolism and thrombosis of unspecified deep veins of right lower extremity: Secondary | ICD-10-CM | POA: Diagnosis not present

## 2021-08-15 DIAGNOSIS — I10 Essential (primary) hypertension: Secondary | ICD-10-CM | POA: Diagnosis not present

## 2021-08-17 DIAGNOSIS — I10 Essential (primary) hypertension: Secondary | ICD-10-CM | POA: Diagnosis not present

## 2021-08-17 DIAGNOSIS — M6281 Muscle weakness (generalized): Secondary | ICD-10-CM | POA: Diagnosis not present

## 2021-08-17 DIAGNOSIS — F331 Major depressive disorder, recurrent, moderate: Secondary | ICD-10-CM | POA: Diagnosis not present

## 2021-08-17 DIAGNOSIS — G9341 Metabolic encephalopathy: Secondary | ICD-10-CM | POA: Diagnosis not present

## 2021-08-17 DIAGNOSIS — D6832 Hemorrhagic disorder due to extrinsic circulating anticoagulants: Secondary | ICD-10-CM | POA: Diagnosis not present

## 2021-08-17 DIAGNOSIS — E039 Hypothyroidism, unspecified: Secondary | ICD-10-CM | POA: Diagnosis not present

## 2021-08-17 DIAGNOSIS — F0151 Vascular dementia with behavioral disturbance: Secondary | ICD-10-CM | POA: Diagnosis not present

## 2021-08-17 DIAGNOSIS — E782 Mixed hyperlipidemia: Secondary | ICD-10-CM | POA: Diagnosis not present

## 2021-08-17 DIAGNOSIS — N179 Acute kidney failure, unspecified: Secondary | ICD-10-CM | POA: Diagnosis not present

## 2021-08-17 DIAGNOSIS — R278 Other lack of coordination: Secondary | ICD-10-CM | POA: Diagnosis not present

## 2021-08-17 DIAGNOSIS — I639 Cerebral infarction, unspecified: Secondary | ICD-10-CM | POA: Diagnosis not present

## 2021-08-17 DIAGNOSIS — M80061A Age-related osteoporosis with current pathological fracture, right lower leg, initial encounter for fracture: Secondary | ICD-10-CM | POA: Diagnosis not present

## 2021-08-17 DIAGNOSIS — F329 Major depressive disorder, single episode, unspecified: Secondary | ICD-10-CM | POA: Diagnosis not present

## 2021-08-17 DIAGNOSIS — F5105 Insomnia due to other mental disorder: Secondary | ICD-10-CM | POA: Diagnosis not present

## 2021-08-17 DIAGNOSIS — D68312 Antiphospholipid antibody with hemorrhagic disorder: Secondary | ICD-10-CM | POA: Diagnosis not present

## 2021-08-17 DIAGNOSIS — R2681 Unsteadiness on feet: Secondary | ICD-10-CM | POA: Diagnosis not present

## 2021-08-18 ENCOUNTER — Telehealth: Payer: Self-pay | Admitting: *Deleted

## 2021-08-18 DIAGNOSIS — U071 COVID-19: Secondary | ICD-10-CM | POA: Diagnosis not present

## 2021-08-18 DIAGNOSIS — I7409 Other arterial embolism and thrombosis of abdominal aorta: Secondary | ICD-10-CM | POA: Diagnosis not present

## 2021-08-18 DIAGNOSIS — D509 Iron deficiency anemia, unspecified: Secondary | ICD-10-CM | POA: Diagnosis not present

## 2021-08-18 DIAGNOSIS — R1312 Dysphagia, oropharyngeal phase: Secondary | ICD-10-CM | POA: Diagnosis not present

## 2021-08-18 DIAGNOSIS — I82401 Acute embolism and thrombosis of unspecified deep veins of right lower extremity: Secondary | ICD-10-CM | POA: Diagnosis not present

## 2021-08-18 DIAGNOSIS — I10 Essential (primary) hypertension: Secondary | ICD-10-CM | POA: Diagnosis not present

## 2021-08-18 DIAGNOSIS — Z7901 Long term (current) use of anticoagulants: Secondary | ICD-10-CM | POA: Diagnosis not present

## 2021-08-18 DIAGNOSIS — N189 Chronic kidney disease, unspecified: Secondary | ICD-10-CM | POA: Diagnosis not present

## 2021-08-18 DIAGNOSIS — G9341 Metabolic encephalopathy: Secondary | ICD-10-CM | POA: Diagnosis not present

## 2021-08-18 DIAGNOSIS — M84471D Pathological fracture, right ankle, subsequent encounter for fracture with routine healing: Secondary | ICD-10-CM | POA: Diagnosis not present

## 2021-08-18 DIAGNOSIS — I6350 Cerebral infarction due to unspecified occlusion or stenosis of unspecified cerebral artery: Secondary | ICD-10-CM | POA: Diagnosis not present

## 2021-08-18 NOTE — Telephone Encounter (Signed)
Unable to reach patient to schedule her AWV. Unable to leave message due to pt voicemail being full per recording.

## 2021-08-21 DIAGNOSIS — I639 Cerebral infarction, unspecified: Secondary | ICD-10-CM | POA: Diagnosis not present

## 2021-08-21 DIAGNOSIS — E782 Mixed hyperlipidemia: Secondary | ICD-10-CM | POA: Diagnosis not present

## 2021-08-21 DIAGNOSIS — I10 Essential (primary) hypertension: Secondary | ICD-10-CM | POA: Diagnosis not present

## 2021-08-21 DIAGNOSIS — D6832 Hemorrhagic disorder due to extrinsic circulating anticoagulants: Secondary | ICD-10-CM | POA: Diagnosis not present

## 2021-08-21 DIAGNOSIS — R278 Other lack of coordination: Secondary | ICD-10-CM | POA: Diagnosis not present

## 2021-08-21 DIAGNOSIS — R2681 Unsteadiness on feet: Secondary | ICD-10-CM | POA: Diagnosis not present

## 2021-08-21 DIAGNOSIS — D68312 Antiphospholipid antibody with hemorrhagic disorder: Secondary | ICD-10-CM | POA: Diagnosis not present

## 2021-08-21 DIAGNOSIS — N179 Acute kidney failure, unspecified: Secondary | ICD-10-CM | POA: Diagnosis not present

## 2021-08-21 DIAGNOSIS — E039 Hypothyroidism, unspecified: Secondary | ICD-10-CM | POA: Diagnosis not present

## 2021-08-21 DIAGNOSIS — F329 Major depressive disorder, single episode, unspecified: Secondary | ICD-10-CM | POA: Diagnosis not present

## 2021-08-21 DIAGNOSIS — M80061A Age-related osteoporosis with current pathological fracture, right lower leg, initial encounter for fracture: Secondary | ICD-10-CM | POA: Diagnosis not present

## 2021-08-21 DIAGNOSIS — G9341 Metabolic encephalopathy: Secondary | ICD-10-CM | POA: Diagnosis not present

## 2021-08-21 DIAGNOSIS — M6281 Muscle weakness (generalized): Secondary | ICD-10-CM | POA: Diagnosis not present

## 2021-08-22 DIAGNOSIS — I82401 Acute embolism and thrombosis of unspecified deep veins of right lower extremity: Secondary | ICD-10-CM | POA: Diagnosis not present

## 2021-08-22 DIAGNOSIS — I10 Essential (primary) hypertension: Secondary | ICD-10-CM | POA: Diagnosis not present

## 2021-08-24 DIAGNOSIS — E46 Unspecified protein-calorie malnutrition: Secondary | ICD-10-CM | POA: Diagnosis not present

## 2021-08-24 DIAGNOSIS — E039 Hypothyroidism, unspecified: Secondary | ICD-10-CM | POA: Diagnosis not present

## 2021-08-24 DIAGNOSIS — I679 Cerebrovascular disease, unspecified: Secondary | ICD-10-CM | POA: Diagnosis not present

## 2021-08-24 DIAGNOSIS — I10 Essential (primary) hypertension: Secondary | ICD-10-CM | POA: Diagnosis not present

## 2021-08-24 DIAGNOSIS — E860 Dehydration: Secondary | ICD-10-CM | POA: Diagnosis not present

## 2021-08-24 DIAGNOSIS — Z8673 Personal history of transient ischemic attack (TIA), and cerebral infarction without residual deficits: Secondary | ICD-10-CM | POA: Diagnosis not present

## 2021-08-24 DIAGNOSIS — Z9181 History of falling: Secondary | ICD-10-CM | POA: Diagnosis not present

## 2021-08-24 DIAGNOSIS — E786 Lipoprotein deficiency: Secondary | ICD-10-CM | POA: Diagnosis not present

## 2021-08-24 DIAGNOSIS — L89302 Pressure ulcer of unspecified buttock, stage 2: Secondary | ICD-10-CM | POA: Diagnosis not present

## 2021-08-24 DIAGNOSIS — I69315 Cognitive social or emotional deficit following cerebral infarction: Secondary | ICD-10-CM | POA: Diagnosis not present

## 2021-08-24 DIAGNOSIS — Z66 Do not resuscitate: Secondary | ICD-10-CM | POA: Diagnosis not present

## 2021-08-24 DIAGNOSIS — D649 Anemia, unspecified: Secondary | ICD-10-CM | POA: Diagnosis not present

## 2021-08-24 DIAGNOSIS — I82401 Acute embolism and thrombosis of unspecified deep veins of right lower extremity: Secondary | ICD-10-CM | POA: Diagnosis not present

## 2021-08-24 DIAGNOSIS — R4182 Altered mental status, unspecified: Secondary | ICD-10-CM | POA: Diagnosis not present

## 2021-08-24 DIAGNOSIS — Z515 Encounter for palliative care: Secondary | ICD-10-CM | POA: Diagnosis not present

## 2021-08-24 DIAGNOSIS — M810 Age-related osteoporosis without current pathological fracture: Secondary | ICD-10-CM | POA: Diagnosis not present

## 2021-08-24 DIAGNOSIS — S82851D Displaced trimalleolar fracture of right lower leg, subsequent encounter for closed fracture with routine healing: Secondary | ICD-10-CM | POA: Diagnosis not present

## 2021-08-24 DIAGNOSIS — D6832 Hemorrhagic disorder due to extrinsic circulating anticoagulants: Secondary | ICD-10-CM | POA: Diagnosis not present

## 2021-08-24 DIAGNOSIS — I2510 Atherosclerotic heart disease of native coronary artery without angina pectoris: Secondary | ICD-10-CM | POA: Diagnosis not present

## 2021-08-24 DIAGNOSIS — H547 Unspecified visual loss: Secondary | ICD-10-CM | POA: Diagnosis not present

## 2021-08-25 DIAGNOSIS — F419 Anxiety disorder, unspecified: Secondary | ICD-10-CM | POA: Diagnosis not present

## 2021-08-25 DIAGNOSIS — I10 Essential (primary) hypertension: Secondary | ICD-10-CM | POA: Diagnosis not present

## 2021-08-25 DIAGNOSIS — I82401 Acute embolism and thrombosis of unspecified deep veins of right lower extremity: Secondary | ICD-10-CM | POA: Diagnosis not present

## 2021-08-28 DIAGNOSIS — M2041 Other hammer toe(s) (acquired), right foot: Secondary | ICD-10-CM | POA: Diagnosis not present

## 2021-08-28 DIAGNOSIS — S82831A Other fracture of upper and lower end of right fibula, initial encounter for closed fracture: Secondary | ICD-10-CM | POA: Diagnosis not present

## 2021-08-28 DIAGNOSIS — D6832 Hemorrhagic disorder due to extrinsic circulating anticoagulants: Secondary | ICD-10-CM | POA: Diagnosis not present

## 2021-08-28 DIAGNOSIS — S8251XA Displaced fracture of medial malleolus of right tibia, initial encounter for closed fracture: Secondary | ICD-10-CM | POA: Diagnosis not present

## 2021-08-28 DIAGNOSIS — M129 Arthropathy, unspecified: Secondary | ICD-10-CM | POA: Diagnosis not present

## 2021-08-29 DIAGNOSIS — N179 Acute kidney failure, unspecified: Secondary | ICD-10-CM | POA: Diagnosis not present

## 2021-08-29 DIAGNOSIS — R531 Weakness: Secondary | ICD-10-CM | POA: Diagnosis not present

## 2021-08-29 DIAGNOSIS — M80061A Age-related osteoporosis with current pathological fracture, right lower leg, initial encounter for fracture: Secondary | ICD-10-CM | POA: Diagnosis not present

## 2021-08-29 DIAGNOSIS — M6281 Muscle weakness (generalized): Secondary | ICD-10-CM | POA: Diagnosis not present

## 2021-08-29 DIAGNOSIS — G9341 Metabolic encephalopathy: Secondary | ICD-10-CM | POA: Diagnosis not present

## 2021-08-29 DIAGNOSIS — I10 Essential (primary) hypertension: Secondary | ICD-10-CM | POA: Diagnosis not present

## 2021-08-29 DIAGNOSIS — G4700 Insomnia, unspecified: Secondary | ICD-10-CM | POA: Diagnosis not present

## 2021-08-29 DIAGNOSIS — R278 Other lack of coordination: Secondary | ICD-10-CM | POA: Diagnosis not present

## 2021-08-29 DIAGNOSIS — E039 Hypothyroidism, unspecified: Secondary | ICD-10-CM | POA: Diagnosis not present

## 2021-08-29 DIAGNOSIS — D68312 Antiphospholipid antibody with hemorrhagic disorder: Secondary | ICD-10-CM | POA: Diagnosis not present

## 2021-08-29 DIAGNOSIS — E782 Mixed hyperlipidemia: Secondary | ICD-10-CM | POA: Diagnosis not present

## 2021-08-29 DIAGNOSIS — R2681 Unsteadiness on feet: Secondary | ICD-10-CM | POA: Diagnosis not present

## 2021-08-29 DIAGNOSIS — I82401 Acute embolism and thrombosis of unspecified deep veins of right lower extremity: Secondary | ICD-10-CM | POA: Diagnosis not present

## 2021-08-29 DIAGNOSIS — F329 Major depressive disorder, single episode, unspecified: Secondary | ICD-10-CM | POA: Diagnosis not present

## 2021-08-29 DIAGNOSIS — I639 Cerebral infarction, unspecified: Secondary | ICD-10-CM | POA: Diagnosis not present

## 2021-08-31 DIAGNOSIS — D6832 Hemorrhagic disorder due to extrinsic circulating anticoagulants: Secondary | ICD-10-CM | POA: Diagnosis not present

## 2021-08-31 DIAGNOSIS — L89302 Pressure ulcer of unspecified buttock, stage 2: Secondary | ICD-10-CM | POA: Diagnosis not present

## 2021-09-02 DIAGNOSIS — R1312 Dysphagia, oropharyngeal phase: Secondary | ICD-10-CM | POA: Diagnosis not present

## 2021-09-02 DIAGNOSIS — R262 Difficulty in walking, not elsewhere classified: Secondary | ICD-10-CM | POA: Diagnosis not present

## 2021-09-02 DIAGNOSIS — M6281 Muscle weakness (generalized): Secondary | ICD-10-CM | POA: Diagnosis not present

## 2021-09-02 DIAGNOSIS — G9341 Metabolic encephalopathy: Secondary | ICD-10-CM | POA: Diagnosis not present

## 2021-09-02 DIAGNOSIS — R278 Other lack of coordination: Secondary | ICD-10-CM | POA: Diagnosis not present

## 2021-09-02 DIAGNOSIS — R41841 Cognitive communication deficit: Secondary | ICD-10-CM | POA: Diagnosis not present

## 2021-09-04 DIAGNOSIS — F331 Major depressive disorder, recurrent, moderate: Secondary | ICD-10-CM | POA: Diagnosis not present

## 2021-09-04 DIAGNOSIS — F419 Anxiety disorder, unspecified: Secondary | ICD-10-CM | POA: Diagnosis not present

## 2021-09-04 DIAGNOSIS — D6832 Hemorrhagic disorder due to extrinsic circulating anticoagulants: Secondary | ICD-10-CM | POA: Diagnosis not present

## 2021-09-04 DIAGNOSIS — G4700 Insomnia, unspecified: Secondary | ICD-10-CM | POA: Diagnosis not present

## 2021-09-04 DIAGNOSIS — N179 Acute kidney failure, unspecified: Secondary | ICD-10-CM | POA: Diagnosis not present

## 2021-09-04 DIAGNOSIS — R7989 Other specified abnormal findings of blood chemistry: Secondary | ICD-10-CM | POA: Diagnosis not present

## 2021-09-04 DIAGNOSIS — I82401 Acute embolism and thrombosis of unspecified deep veins of right lower extremity: Secondary | ICD-10-CM | POA: Diagnosis not present

## 2021-09-05 DIAGNOSIS — M6281 Muscle weakness (generalized): Secondary | ICD-10-CM | POA: Diagnosis not present

## 2021-09-05 DIAGNOSIS — G9341 Metabolic encephalopathy: Secondary | ICD-10-CM | POA: Diagnosis not present

## 2021-09-05 DIAGNOSIS — R278 Other lack of coordination: Secondary | ICD-10-CM | POA: Diagnosis not present

## 2021-09-05 DIAGNOSIS — F329 Major depressive disorder, single episode, unspecified: Secondary | ICD-10-CM | POA: Diagnosis not present

## 2021-09-05 DIAGNOSIS — E782 Mixed hyperlipidemia: Secondary | ICD-10-CM | POA: Diagnosis not present

## 2021-09-05 DIAGNOSIS — I639 Cerebral infarction, unspecified: Secondary | ICD-10-CM | POA: Diagnosis not present

## 2021-09-05 DIAGNOSIS — R2681 Unsteadiness on feet: Secondary | ICD-10-CM | POA: Diagnosis not present

## 2021-09-05 DIAGNOSIS — D68312 Antiphospholipid antibody with hemorrhagic disorder: Secondary | ICD-10-CM | POA: Diagnosis not present

## 2021-09-05 DIAGNOSIS — M80061A Age-related osteoporosis with current pathological fracture, right lower leg, initial encounter for fracture: Secondary | ICD-10-CM | POA: Diagnosis not present

## 2021-09-05 DIAGNOSIS — N179 Acute kidney failure, unspecified: Secondary | ICD-10-CM | POA: Diagnosis not present

## 2021-09-05 DIAGNOSIS — E039 Hypothyroidism, unspecified: Secondary | ICD-10-CM | POA: Diagnosis not present

## 2021-09-05 DIAGNOSIS — I10 Essential (primary) hypertension: Secondary | ICD-10-CM | POA: Diagnosis not present

## 2021-09-07 DIAGNOSIS — Z8673 Personal history of transient ischemic attack (TIA), and cerebral infarction without residual deficits: Secondary | ICD-10-CM | POA: Diagnosis not present

## 2021-09-07 DIAGNOSIS — D6832 Hemorrhagic disorder due to extrinsic circulating anticoagulants: Secondary | ICD-10-CM | POA: Diagnosis not present

## 2021-09-07 DIAGNOSIS — F5105 Insomnia due to other mental disorder: Secondary | ICD-10-CM | POA: Diagnosis not present

## 2021-09-07 DIAGNOSIS — F331 Major depressive disorder, recurrent, moderate: Secondary | ICD-10-CM | POA: Diagnosis not present

## 2021-09-07 DIAGNOSIS — S82851D Displaced trimalleolar fracture of right lower leg, subsequent encounter for closed fracture with routine healing: Secondary | ICD-10-CM | POA: Diagnosis not present

## 2021-09-08 DIAGNOSIS — I82401 Acute embolism and thrombosis of unspecified deep veins of right lower extremity: Secondary | ICD-10-CM | POA: Diagnosis not present

## 2021-09-08 DIAGNOSIS — G4700 Insomnia, unspecified: Secondary | ICD-10-CM | POA: Diagnosis not present

## 2021-09-12 DIAGNOSIS — D6861 Antiphospholipid syndrome: Secondary | ICD-10-CM | POA: Diagnosis not present

## 2021-09-12 DIAGNOSIS — I639 Cerebral infarction, unspecified: Secondary | ICD-10-CM | POA: Diagnosis not present

## 2021-09-12 DIAGNOSIS — I10 Essential (primary) hypertension: Secondary | ICD-10-CM | POA: Diagnosis not present

## 2021-09-12 DIAGNOSIS — R2681 Unsteadiness on feet: Secondary | ICD-10-CM | POA: Diagnosis not present

## 2021-09-12 DIAGNOSIS — Z79899 Other long term (current) drug therapy: Secondary | ICD-10-CM | POA: Diagnosis not present

## 2021-09-12 DIAGNOSIS — F01511 Vascular dementia, unspecified severity, with agitation: Secondary | ICD-10-CM | POA: Diagnosis not present

## 2021-09-12 DIAGNOSIS — G9341 Metabolic encephalopathy: Secondary | ICD-10-CM | POA: Diagnosis not present

## 2021-09-12 DIAGNOSIS — F329 Major depressive disorder, single episode, unspecified: Secondary | ICD-10-CM | POA: Diagnosis not present

## 2021-09-12 DIAGNOSIS — R278 Other lack of coordination: Secondary | ICD-10-CM | POA: Diagnosis not present

## 2021-09-12 DIAGNOSIS — I69315 Cognitive social or emotional deficit following cerebral infarction: Secondary | ICD-10-CM | POA: Diagnosis not present

## 2021-09-12 DIAGNOSIS — N179 Acute kidney failure, unspecified: Secondary | ICD-10-CM | POA: Diagnosis not present

## 2021-09-12 DIAGNOSIS — I2510 Atherosclerotic heart disease of native coronary artery without angina pectoris: Secondary | ICD-10-CM | POA: Diagnosis not present

## 2021-09-12 DIAGNOSIS — E782 Mixed hyperlipidemia: Secondary | ICD-10-CM | POA: Diagnosis not present

## 2021-09-12 DIAGNOSIS — Z515 Encounter for palliative care: Secondary | ICD-10-CM | POA: Diagnosis not present

## 2021-09-12 DIAGNOSIS — M6281 Muscle weakness (generalized): Secondary | ICD-10-CM | POA: Diagnosis not present

## 2021-09-12 DIAGNOSIS — Z9181 History of falling: Secondary | ICD-10-CM | POA: Diagnosis not present

## 2021-09-12 DIAGNOSIS — E039 Hypothyroidism, unspecified: Secondary | ICD-10-CM | POA: Diagnosis not present

## 2021-09-12 DIAGNOSIS — M810 Age-related osteoporosis without current pathological fracture: Secondary | ICD-10-CM | POA: Diagnosis not present

## 2021-09-12 DIAGNOSIS — M80061A Age-related osteoporosis with current pathological fracture, right lower leg, initial encounter for fracture: Secondary | ICD-10-CM | POA: Diagnosis not present

## 2021-09-12 DIAGNOSIS — D6832 Hemorrhagic disorder due to extrinsic circulating anticoagulants: Secondary | ICD-10-CM | POA: Diagnosis not present

## 2021-09-12 DIAGNOSIS — D68312 Antiphospholipid antibody with hemorrhagic disorder: Secondary | ICD-10-CM | POA: Diagnosis not present

## 2021-09-12 DIAGNOSIS — D649 Anemia, unspecified: Secondary | ICD-10-CM | POA: Diagnosis not present

## 2021-09-12 DIAGNOSIS — I679 Cerebrovascular disease, unspecified: Secondary | ICD-10-CM | POA: Diagnosis not present

## 2021-09-12 DIAGNOSIS — Z66 Do not resuscitate: Secondary | ICD-10-CM | POA: Diagnosis not present

## 2021-09-14 DIAGNOSIS — D6832 Hemorrhagic disorder due to extrinsic circulating anticoagulants: Secondary | ICD-10-CM | POA: Diagnosis not present

## 2021-09-15 DIAGNOSIS — G4700 Insomnia, unspecified: Secondary | ICD-10-CM | POA: Diagnosis not present

## 2021-09-15 DIAGNOSIS — I82401 Acute embolism and thrombosis of unspecified deep veins of right lower extremity: Secondary | ICD-10-CM | POA: Diagnosis not present

## 2021-09-18 DIAGNOSIS — F331 Major depressive disorder, recurrent, moderate: Secondary | ICD-10-CM | POA: Diagnosis not present

## 2021-09-18 DIAGNOSIS — F419 Anxiety disorder, unspecified: Secondary | ICD-10-CM | POA: Diagnosis not present

## 2021-09-18 DIAGNOSIS — D6832 Hemorrhagic disorder due to extrinsic circulating anticoagulants: Secondary | ICD-10-CM | POA: Diagnosis not present

## 2021-09-19 DIAGNOSIS — G4700 Insomnia, unspecified: Secondary | ICD-10-CM | POA: Diagnosis not present

## 2021-09-19 DIAGNOSIS — I82401 Acute embolism and thrombosis of unspecified deep veins of right lower extremity: Secondary | ICD-10-CM | POA: Diagnosis not present

## 2021-09-21 DIAGNOSIS — F331 Major depressive disorder, recurrent, moderate: Secondary | ICD-10-CM | POA: Diagnosis not present

## 2021-09-21 DIAGNOSIS — D68312 Antiphospholipid antibody with hemorrhagic disorder: Secondary | ICD-10-CM | POA: Diagnosis not present

## 2021-09-21 DIAGNOSIS — E039 Hypothyroidism, unspecified: Secondary | ICD-10-CM | POA: Diagnosis not present

## 2021-09-21 DIAGNOSIS — G9341 Metabolic encephalopathy: Secondary | ICD-10-CM | POA: Diagnosis not present

## 2021-09-21 DIAGNOSIS — D6832 Hemorrhagic disorder due to extrinsic circulating anticoagulants: Secondary | ICD-10-CM | POA: Diagnosis not present

## 2021-09-21 DIAGNOSIS — R2681 Unsteadiness on feet: Secondary | ICD-10-CM | POA: Diagnosis not present

## 2021-09-21 DIAGNOSIS — M6281 Muscle weakness (generalized): Secondary | ICD-10-CM | POA: Diagnosis not present

## 2021-09-21 DIAGNOSIS — F01B4 Vascular dementia, moderate, with anxiety: Secondary | ICD-10-CM | POA: Diagnosis not present

## 2021-09-21 DIAGNOSIS — F329 Major depressive disorder, single episode, unspecified: Secondary | ICD-10-CM | POA: Diagnosis not present

## 2021-09-21 DIAGNOSIS — I10 Essential (primary) hypertension: Secondary | ICD-10-CM | POA: Diagnosis not present

## 2021-09-21 DIAGNOSIS — I639 Cerebral infarction, unspecified: Secondary | ICD-10-CM | POA: Diagnosis not present

## 2021-09-21 DIAGNOSIS — N179 Acute kidney failure, unspecified: Secondary | ICD-10-CM | POA: Diagnosis not present

## 2021-09-21 DIAGNOSIS — E782 Mixed hyperlipidemia: Secondary | ICD-10-CM | POA: Diagnosis not present

## 2021-09-21 DIAGNOSIS — M80061A Age-related osteoporosis with current pathological fracture, right lower leg, initial encounter for fracture: Secondary | ICD-10-CM | POA: Diagnosis not present

## 2021-09-21 DIAGNOSIS — F5105 Insomnia due to other mental disorder: Secondary | ICD-10-CM | POA: Diagnosis not present

## 2021-09-21 DIAGNOSIS — R278 Other lack of coordination: Secondary | ICD-10-CM | POA: Diagnosis not present

## 2021-09-22 DIAGNOSIS — I82401 Acute embolism and thrombosis of unspecified deep veins of right lower extremity: Secondary | ICD-10-CM | POA: Diagnosis not present

## 2021-09-22 DIAGNOSIS — G4700 Insomnia, unspecified: Secondary | ICD-10-CM | POA: Diagnosis not present

## 2021-09-25 DIAGNOSIS — R197 Diarrhea, unspecified: Secondary | ICD-10-CM | POA: Diagnosis not present

## 2021-09-25 DIAGNOSIS — I10 Essential (primary) hypertension: Secondary | ICD-10-CM | POA: Diagnosis not present

## 2021-09-25 DIAGNOSIS — D6832 Hemorrhagic disorder due to extrinsic circulating anticoagulants: Secondary | ICD-10-CM | POA: Diagnosis not present

## 2021-09-26 DIAGNOSIS — I82401 Acute embolism and thrombosis of unspecified deep veins of right lower extremity: Secondary | ICD-10-CM | POA: Diagnosis not present

## 2021-09-26 DIAGNOSIS — E782 Mixed hyperlipidemia: Secondary | ICD-10-CM | POA: Diagnosis not present

## 2021-09-26 DIAGNOSIS — N179 Acute kidney failure, unspecified: Secondary | ICD-10-CM | POA: Diagnosis not present

## 2021-09-26 DIAGNOSIS — I639 Cerebral infarction, unspecified: Secondary | ICD-10-CM | POA: Diagnosis not present

## 2021-09-26 DIAGNOSIS — D68312 Antiphospholipid antibody with hemorrhagic disorder: Secondary | ICD-10-CM | POA: Diagnosis not present

## 2021-09-26 DIAGNOSIS — M6281 Muscle weakness (generalized): Secondary | ICD-10-CM | POA: Diagnosis not present

## 2021-09-26 DIAGNOSIS — R278 Other lack of coordination: Secondary | ICD-10-CM | POA: Diagnosis not present

## 2021-09-26 DIAGNOSIS — F329 Major depressive disorder, single episode, unspecified: Secondary | ICD-10-CM | POA: Diagnosis not present

## 2021-09-26 DIAGNOSIS — R2681 Unsteadiness on feet: Secondary | ICD-10-CM | POA: Diagnosis not present

## 2021-09-26 DIAGNOSIS — G9341 Metabolic encephalopathy: Secondary | ICD-10-CM | POA: Diagnosis not present

## 2021-09-26 DIAGNOSIS — I10 Essential (primary) hypertension: Secondary | ICD-10-CM | POA: Diagnosis not present

## 2021-09-26 DIAGNOSIS — E039 Hypothyroidism, unspecified: Secondary | ICD-10-CM | POA: Diagnosis not present

## 2021-09-26 DIAGNOSIS — M80061A Age-related osteoporosis with current pathological fracture, right lower leg, initial encounter for fracture: Secondary | ICD-10-CM | POA: Diagnosis not present

## 2021-09-28 DIAGNOSIS — D6832 Hemorrhagic disorder due to extrinsic circulating anticoagulants: Secondary | ICD-10-CM | POA: Diagnosis not present

## 2021-09-29 DIAGNOSIS — I82401 Acute embolism and thrombosis of unspecified deep veins of right lower extremity: Secondary | ICD-10-CM | POA: Diagnosis not present

## 2021-09-29 DIAGNOSIS — I10 Essential (primary) hypertension: Secondary | ICD-10-CM | POA: Diagnosis not present

## 2021-10-02 DIAGNOSIS — D6832 Hemorrhagic disorder due to extrinsic circulating anticoagulants: Secondary | ICD-10-CM | POA: Diagnosis not present

## 2021-10-03 DIAGNOSIS — R1312 Dysphagia, oropharyngeal phase: Secondary | ICD-10-CM | POA: Diagnosis not present

## 2021-10-03 DIAGNOSIS — N179 Acute kidney failure, unspecified: Secondary | ICD-10-CM | POA: Diagnosis not present

## 2021-10-03 DIAGNOSIS — E782 Mixed hyperlipidemia: Secondary | ICD-10-CM | POA: Diagnosis not present

## 2021-10-03 DIAGNOSIS — R2681 Unsteadiness on feet: Secondary | ICD-10-CM | POA: Diagnosis not present

## 2021-10-03 DIAGNOSIS — M80061A Age-related osteoporosis with current pathological fracture, right lower leg, initial encounter for fracture: Secondary | ICD-10-CM | POA: Diagnosis not present

## 2021-10-03 DIAGNOSIS — G9341 Metabolic encephalopathy: Secondary | ICD-10-CM | POA: Diagnosis not present

## 2021-10-03 DIAGNOSIS — R262 Difficulty in walking, not elsewhere classified: Secondary | ICD-10-CM | POA: Diagnosis not present

## 2021-10-03 DIAGNOSIS — F329 Major depressive disorder, single episode, unspecified: Secondary | ICD-10-CM | POA: Diagnosis not present

## 2021-10-03 DIAGNOSIS — D68312 Antiphospholipid antibody with hemorrhagic disorder: Secondary | ICD-10-CM | POA: Diagnosis not present

## 2021-10-03 DIAGNOSIS — R41841 Cognitive communication deficit: Secondary | ICD-10-CM | POA: Diagnosis not present

## 2021-10-03 DIAGNOSIS — E039 Hypothyroidism, unspecified: Secondary | ICD-10-CM | POA: Diagnosis not present

## 2021-10-03 DIAGNOSIS — M6281 Muscle weakness (generalized): Secondary | ICD-10-CM | POA: Diagnosis not present

## 2021-10-03 DIAGNOSIS — R278 Other lack of coordination: Secondary | ICD-10-CM | POA: Diagnosis not present

## 2021-10-03 DIAGNOSIS — I639 Cerebral infarction, unspecified: Secondary | ICD-10-CM | POA: Diagnosis not present

## 2021-10-03 DIAGNOSIS — I82401 Acute embolism and thrombosis of unspecified deep veins of right lower extremity: Secondary | ICD-10-CM | POA: Diagnosis not present

## 2021-10-03 DIAGNOSIS — L609 Nail disorder, unspecified: Secondary | ICD-10-CM | POA: Diagnosis not present

## 2021-10-03 DIAGNOSIS — I10 Essential (primary) hypertension: Secondary | ICD-10-CM | POA: Diagnosis not present

## 2021-10-04 DIAGNOSIS — I10 Essential (primary) hypertension: Secondary | ICD-10-CM | POA: Diagnosis not present

## 2021-10-04 DIAGNOSIS — R197 Diarrhea, unspecified: Secondary | ICD-10-CM | POA: Diagnosis not present

## 2021-10-05 DIAGNOSIS — D6832 Hemorrhagic disorder due to extrinsic circulating anticoagulants: Secondary | ICD-10-CM | POA: Diagnosis not present

## 2021-10-09 DIAGNOSIS — I10 Essential (primary) hypertension: Secondary | ICD-10-CM | POA: Diagnosis not present

## 2021-10-09 DIAGNOSIS — D6832 Hemorrhagic disorder due to extrinsic circulating anticoagulants: Secondary | ICD-10-CM | POA: Diagnosis not present

## 2021-10-09 DIAGNOSIS — I82401 Acute embolism and thrombosis of unspecified deep veins of right lower extremity: Secondary | ICD-10-CM | POA: Diagnosis not present

## 2021-10-10 DIAGNOSIS — M6281 Muscle weakness (generalized): Secondary | ICD-10-CM | POA: Diagnosis not present

## 2021-10-10 DIAGNOSIS — E782 Mixed hyperlipidemia: Secondary | ICD-10-CM | POA: Diagnosis not present

## 2021-10-10 DIAGNOSIS — M80061A Age-related osteoporosis with current pathological fracture, right lower leg, initial encounter for fracture: Secondary | ICD-10-CM | POA: Diagnosis not present

## 2021-10-10 DIAGNOSIS — R278 Other lack of coordination: Secondary | ICD-10-CM | POA: Diagnosis not present

## 2021-10-10 DIAGNOSIS — R2681 Unsteadiness on feet: Secondary | ICD-10-CM | POA: Diagnosis not present

## 2021-10-10 DIAGNOSIS — D68312 Antiphospholipid antibody with hemorrhagic disorder: Secondary | ICD-10-CM | POA: Diagnosis not present

## 2021-10-10 DIAGNOSIS — F329 Major depressive disorder, single episode, unspecified: Secondary | ICD-10-CM | POA: Diagnosis not present

## 2021-10-10 DIAGNOSIS — I639 Cerebral infarction, unspecified: Secondary | ICD-10-CM | POA: Diagnosis not present

## 2021-10-10 DIAGNOSIS — G9341 Metabolic encephalopathy: Secondary | ICD-10-CM | POA: Diagnosis not present

## 2021-10-10 DIAGNOSIS — I10 Essential (primary) hypertension: Secondary | ICD-10-CM | POA: Diagnosis not present

## 2021-10-10 DIAGNOSIS — I82401 Acute embolism and thrombosis of unspecified deep veins of right lower extremity: Secondary | ICD-10-CM | POA: Diagnosis not present

## 2021-10-10 DIAGNOSIS — E039 Hypothyroidism, unspecified: Secondary | ICD-10-CM | POA: Diagnosis not present

## 2021-10-10 DIAGNOSIS — N179 Acute kidney failure, unspecified: Secondary | ICD-10-CM | POA: Diagnosis not present

## 2021-10-14 DIAGNOSIS — Z86718 Personal history of other venous thrombosis and embolism: Secondary | ICD-10-CM | POA: Diagnosis not present

## 2021-10-14 DIAGNOSIS — Z880 Allergy status to penicillin: Secondary | ICD-10-CM | POA: Diagnosis not present

## 2021-10-14 DIAGNOSIS — Z9049 Acquired absence of other specified parts of digestive tract: Secondary | ICD-10-CM | POA: Diagnosis not present

## 2021-10-14 DIAGNOSIS — R509 Fever, unspecified: Secondary | ICD-10-CM | POA: Diagnosis not present

## 2021-10-14 DIAGNOSIS — E46 Unspecified protein-calorie malnutrition: Secondary | ICD-10-CM | POA: Diagnosis not present

## 2021-10-14 DIAGNOSIS — L89302 Pressure ulcer of unspecified buttock, stage 2: Secondary | ICD-10-CM | POA: Diagnosis not present

## 2021-10-14 DIAGNOSIS — Z8616 Personal history of COVID-19: Secondary | ICD-10-CM | POA: Diagnosis not present

## 2021-10-14 DIAGNOSIS — Z20822 Contact with and (suspected) exposure to covid-19: Secondary | ICD-10-CM | POA: Diagnosis not present

## 2021-10-14 DIAGNOSIS — R41841 Cognitive communication deficit: Secondary | ICD-10-CM | POA: Diagnosis not present

## 2021-10-14 DIAGNOSIS — Z7401 Bed confinement status: Secondary | ICD-10-CM | POA: Diagnosis not present

## 2021-10-14 DIAGNOSIS — F0390 Unspecified dementia without behavioral disturbance: Secondary | ICD-10-CM | POA: Diagnosis not present

## 2021-10-14 DIAGNOSIS — R531 Weakness: Secondary | ICD-10-CM | POA: Diagnosis not present

## 2021-10-14 DIAGNOSIS — N136 Pyonephrosis: Secondary | ICD-10-CM | POA: Diagnosis not present

## 2021-10-14 DIAGNOSIS — R112 Nausea with vomiting, unspecified: Secondary | ICD-10-CM | POA: Diagnosis not present

## 2021-10-14 DIAGNOSIS — M5489 Other dorsalgia: Secondary | ICD-10-CM | POA: Diagnosis not present

## 2021-10-14 DIAGNOSIS — F411 Generalized anxiety disorder: Secondary | ICD-10-CM | POA: Diagnosis not present

## 2021-10-14 DIAGNOSIS — N179 Acute kidney failure, unspecified: Secondary | ICD-10-CM | POA: Diagnosis not present

## 2021-10-14 DIAGNOSIS — A4101 Sepsis due to Methicillin susceptible Staphylococcus aureus: Secondary | ICD-10-CM | POA: Diagnosis not present

## 2021-10-14 DIAGNOSIS — E871 Hypo-osmolality and hyponatremia: Secondary | ICD-10-CM | POA: Diagnosis not present

## 2021-10-14 DIAGNOSIS — R404 Transient alteration of awareness: Secondary | ICD-10-CM | POA: Diagnosis not present

## 2021-10-14 DIAGNOSIS — Z7901 Long term (current) use of anticoagulants: Secondary | ICD-10-CM | POA: Diagnosis not present

## 2021-10-14 DIAGNOSIS — I69351 Hemiplegia and hemiparesis following cerebral infarction affecting right dominant side: Secondary | ICD-10-CM | POA: Diagnosis not present

## 2021-10-14 DIAGNOSIS — R1084 Generalized abdominal pain: Secondary | ICD-10-CM | POA: Diagnosis not present

## 2021-10-14 DIAGNOSIS — N189 Chronic kidney disease, unspecified: Secondary | ICD-10-CM | POA: Diagnosis not present

## 2021-10-14 DIAGNOSIS — E039 Hypothyroidism, unspecified: Secondary | ICD-10-CM | POA: Diagnosis not present

## 2021-10-14 DIAGNOSIS — D72829 Elevated white blood cell count, unspecified: Secondary | ICD-10-CM | POA: Diagnosis not present

## 2021-10-14 DIAGNOSIS — I69398 Other sequelae of cerebral infarction: Secondary | ICD-10-CM | POA: Diagnosis not present

## 2021-10-14 DIAGNOSIS — R7881 Bacteremia: Secondary | ICD-10-CM | POA: Diagnosis not present

## 2021-10-14 DIAGNOSIS — G4700 Insomnia, unspecified: Secondary | ICD-10-CM | POA: Diagnosis not present

## 2021-10-14 DIAGNOSIS — M84471D Pathological fracture, right ankle, subsequent encounter for fracture with routine healing: Secondary | ICD-10-CM | POA: Diagnosis not present

## 2021-10-14 DIAGNOSIS — I10 Essential (primary) hypertension: Secondary | ICD-10-CM | POA: Diagnosis not present

## 2021-10-14 DIAGNOSIS — I7409 Other arterial embolism and thrombosis of abdominal aorta: Secondary | ICD-10-CM | POA: Diagnosis not present

## 2021-10-14 DIAGNOSIS — K219 Gastro-esophageal reflux disease without esophagitis: Secondary | ICD-10-CM | POA: Diagnosis not present

## 2021-10-14 DIAGNOSIS — M546 Pain in thoracic spine: Secondary | ICD-10-CM | POA: Diagnosis not present

## 2021-10-14 DIAGNOSIS — R1312 Dysphagia, oropharyngeal phase: Secondary | ICD-10-CM | POA: Diagnosis not present

## 2021-10-14 DIAGNOSIS — D735 Infarction of spleen: Secondary | ICD-10-CM | POA: Diagnosis not present

## 2021-10-14 DIAGNOSIS — E278 Other specified disorders of adrenal gland: Secondary | ICD-10-CM | POA: Diagnosis not present

## 2021-10-14 DIAGNOSIS — N12 Tubulo-interstitial nephritis, not specified as acute or chronic: Secondary | ICD-10-CM | POA: Diagnosis not present

## 2021-10-14 DIAGNOSIS — F32A Depression, unspecified: Secondary | ICD-10-CM | POA: Diagnosis not present

## 2021-10-14 DIAGNOSIS — M6281 Muscle weakness (generalized): Secondary | ICD-10-CM | POA: Diagnosis not present

## 2021-10-14 DIAGNOSIS — M4317 Spondylolisthesis, lumbosacral region: Secondary | ICD-10-CM | POA: Diagnosis not present

## 2021-10-14 DIAGNOSIS — R7402 Elevation of levels of lactic acid dehydrogenase (LDH): Secondary | ICD-10-CM | POA: Diagnosis not present

## 2021-10-14 DIAGNOSIS — Z66 Do not resuscitate: Secondary | ICD-10-CM | POA: Diagnosis not present

## 2021-10-14 DIAGNOSIS — M47816 Spondylosis without myelopathy or radiculopathy, lumbar region: Secondary | ICD-10-CM | POA: Diagnosis not present

## 2021-10-14 DIAGNOSIS — I6350 Cerebral infarction due to unspecified occlusion or stenosis of unspecified cerebral artery: Secondary | ICD-10-CM | POA: Diagnosis not present

## 2021-10-14 DIAGNOSIS — G9341 Metabolic encephalopathy: Secondary | ICD-10-CM | POA: Diagnosis not present

## 2021-10-14 DIAGNOSIS — Z87440 Personal history of urinary (tract) infections: Secondary | ICD-10-CM | POA: Diagnosis not present

## 2021-10-14 DIAGNOSIS — I350 Nonrheumatic aortic (valve) stenosis: Secondary | ICD-10-CM | POA: Diagnosis not present

## 2021-10-14 DIAGNOSIS — R630 Anorexia: Secondary | ICD-10-CM | POA: Diagnosis not present

## 2021-10-14 DIAGNOSIS — N200 Calculus of kidney: Secondary | ICD-10-CM | POA: Diagnosis not present

## 2021-10-14 DIAGNOSIS — R194 Change in bowel habit: Secondary | ICD-10-CM | POA: Diagnosis not present

## 2021-10-14 DIAGNOSIS — I82401 Acute embolism and thrombosis of unspecified deep veins of right lower extremity: Secondary | ICD-10-CM | POA: Diagnosis not present

## 2021-10-14 DIAGNOSIS — D6861 Antiphospholipid syndrome: Secondary | ICD-10-CM | POA: Diagnosis not present

## 2021-10-14 DIAGNOSIS — H547 Unspecified visual loss: Secondary | ICD-10-CM | POA: Diagnosis not present

## 2021-10-14 DIAGNOSIS — N202 Calculus of kidney with calculus of ureter: Secondary | ICD-10-CM | POA: Diagnosis not present

## 2021-10-14 DIAGNOSIS — R278 Other lack of coordination: Secondary | ICD-10-CM | POA: Diagnosis not present

## 2021-10-14 DIAGNOSIS — D649 Anemia, unspecified: Secondary | ICD-10-CM | POA: Diagnosis not present

## 2021-10-14 DIAGNOSIS — H5347 Heteronymous bilateral field defects: Secondary | ICD-10-CM | POA: Diagnosis not present

## 2021-10-14 DIAGNOSIS — N132 Hydronephrosis with renal and ureteral calculous obstruction: Secondary | ICD-10-CM | POA: Diagnosis not present

## 2021-10-14 DIAGNOSIS — M810 Age-related osteoporosis without current pathological fracture: Secondary | ICD-10-CM | POA: Diagnosis not present

## 2021-10-14 DIAGNOSIS — B9561 Methicillin susceptible Staphylococcus aureus infection as the cause of diseases classified elsewhere: Secondary | ICD-10-CM | POA: Diagnosis not present

## 2021-10-16 DIAGNOSIS — G9341 Metabolic encephalopathy: Secondary | ICD-10-CM | POA: Diagnosis not present

## 2021-10-16 DIAGNOSIS — I82401 Acute embolism and thrombosis of unspecified deep veins of right lower extremity: Secondary | ICD-10-CM | POA: Diagnosis not present

## 2021-10-16 DIAGNOSIS — Z7901 Long term (current) use of anticoagulants: Secondary | ICD-10-CM | POA: Diagnosis not present

## 2021-10-16 DIAGNOSIS — I6350 Cerebral infarction due to unspecified occlusion or stenosis of unspecified cerebral artery: Secondary | ICD-10-CM | POA: Diagnosis not present

## 2021-10-16 DIAGNOSIS — N189 Chronic kidney disease, unspecified: Secondary | ICD-10-CM | POA: Diagnosis not present

## 2021-10-16 DIAGNOSIS — L89302 Pressure ulcer of unspecified buttock, stage 2: Secondary | ICD-10-CM | POA: Diagnosis not present

## 2021-10-16 DIAGNOSIS — E46 Unspecified protein-calorie malnutrition: Secondary | ICD-10-CM | POA: Diagnosis not present

## 2021-10-16 DIAGNOSIS — G4700 Insomnia, unspecified: Secondary | ICD-10-CM | POA: Diagnosis not present

## 2021-10-16 DIAGNOSIS — I7409 Other arterial embolism and thrombosis of abdominal aorta: Secondary | ICD-10-CM | POA: Diagnosis not present

## 2021-10-16 DIAGNOSIS — E039 Hypothyroidism, unspecified: Secondary | ICD-10-CM | POA: Diagnosis not present

## 2021-10-16 DIAGNOSIS — M84471D Pathological fracture, right ankle, subsequent encounter for fracture with routine healing: Secondary | ICD-10-CM | POA: Diagnosis not present

## 2021-10-16 DIAGNOSIS — I10 Essential (primary) hypertension: Secondary | ICD-10-CM | POA: Diagnosis not present

## 2021-10-17 DIAGNOSIS — R7881 Bacteremia: Secondary | ICD-10-CM | POA: Diagnosis not present

## 2021-10-17 DIAGNOSIS — B9561 Methicillin susceptible Staphylococcus aureus infection as the cause of diseases classified elsewhere: Secondary | ICD-10-CM | POA: Diagnosis not present

## 2021-10-17 DIAGNOSIS — I350 Nonrheumatic aortic (valve) stenosis: Secondary | ICD-10-CM | POA: Diagnosis not present

## 2021-10-20 DIAGNOSIS — Z01818 Encounter for other preprocedural examination: Secondary | ICD-10-CM | POA: Diagnosis not present

## 2021-10-20 DIAGNOSIS — L24A2 Irritant contact dermatitis due to fecal, urinary or dual incontinence: Secondary | ICD-10-CM | POA: Diagnosis not present

## 2021-10-20 DIAGNOSIS — M810 Age-related osteoporosis without current pathological fracture: Secondary | ICD-10-CM | POA: Diagnosis not present

## 2021-10-20 DIAGNOSIS — I429 Cardiomyopathy, unspecified: Secondary | ICD-10-CM | POA: Diagnosis not present

## 2021-10-20 DIAGNOSIS — N12 Tubulo-interstitial nephritis, not specified as acute or chronic: Secondary | ICD-10-CM | POA: Diagnosis not present

## 2021-10-20 DIAGNOSIS — R29715 NIHSS score 15: Secondary | ICD-10-CM | POA: Diagnosis not present

## 2021-10-20 DIAGNOSIS — E782 Mixed hyperlipidemia: Secondary | ICD-10-CM | POA: Diagnosis not present

## 2021-10-20 DIAGNOSIS — D6861 Antiphospholipid syndrome: Secondary | ICD-10-CM | POA: Diagnosis not present

## 2021-10-20 DIAGNOSIS — M6281 Muscle weakness (generalized): Secondary | ICD-10-CM | POA: Diagnosis not present

## 2021-10-20 DIAGNOSIS — N319 Neuromuscular dysfunction of bladder, unspecified: Secondary | ICD-10-CM | POA: Diagnosis not present

## 2021-10-20 DIAGNOSIS — N132 Hydronephrosis with renal and ureteral calculous obstruction: Secondary | ICD-10-CM | POA: Diagnosis not present

## 2021-10-20 DIAGNOSIS — F419 Anxiety disorder, unspecified: Secondary | ICD-10-CM | POA: Diagnosis not present

## 2021-10-20 DIAGNOSIS — I6523 Occlusion and stenosis of bilateral carotid arteries: Secondary | ICD-10-CM | POA: Diagnosis not present

## 2021-10-20 DIAGNOSIS — Z20822 Contact with and (suspected) exposure to covid-19: Secondary | ICD-10-CM | POA: Diagnosis not present

## 2021-10-20 DIAGNOSIS — N189 Chronic kidney disease, unspecified: Secondary | ICD-10-CM | POA: Diagnosis not present

## 2021-10-20 DIAGNOSIS — I69351 Hemiplegia and hemiparesis following cerebral infarction affecting right dominant side: Secondary | ICD-10-CM | POA: Diagnosis not present

## 2021-10-20 DIAGNOSIS — R471 Dysarthria and anarthria: Secondary | ICD-10-CM | POA: Diagnosis not present

## 2021-10-20 DIAGNOSIS — I63512 Cerebral infarction due to unspecified occlusion or stenosis of left middle cerebral artery: Secondary | ICD-10-CM | POA: Diagnosis not present

## 2021-10-20 DIAGNOSIS — R404 Transient alteration of awareness: Secondary | ICD-10-CM | POA: Diagnosis not present

## 2021-10-20 DIAGNOSIS — E7801 Familial hypercholesterolemia: Secondary | ICD-10-CM | POA: Diagnosis not present

## 2021-10-20 DIAGNOSIS — Z880 Allergy status to penicillin: Secondary | ICD-10-CM | POA: Diagnosis not present

## 2021-10-20 DIAGNOSIS — N200 Calculus of kidney: Secondary | ICD-10-CM | POA: Diagnosis not present

## 2021-10-20 DIAGNOSIS — N179 Acute kidney failure, unspecified: Secondary | ICD-10-CM | POA: Diagnosis not present

## 2021-10-20 DIAGNOSIS — R278 Other lack of coordination: Secondary | ICD-10-CM | POA: Diagnosis not present

## 2021-10-20 DIAGNOSIS — I82401 Acute embolism and thrombosis of unspecified deep veins of right lower extremity: Secondary | ICD-10-CM | POA: Diagnosis not present

## 2021-10-20 DIAGNOSIS — F329 Major depressive disorder, single episode, unspecified: Secondary | ICD-10-CM | POA: Diagnosis not present

## 2021-10-20 DIAGNOSIS — I10 Essential (primary) hypertension: Secondary | ICD-10-CM | POA: Diagnosis not present

## 2021-10-20 DIAGNOSIS — R531 Weakness: Secondary | ICD-10-CM | POA: Diagnosis not present

## 2021-10-20 DIAGNOSIS — Z8673 Personal history of transient ischemic attack (TIA), and cerebral infarction without residual deficits: Secondary | ICD-10-CM | POA: Diagnosis not present

## 2021-10-20 DIAGNOSIS — R4701 Aphasia: Secondary | ICD-10-CM | POA: Diagnosis not present

## 2021-10-20 DIAGNOSIS — Z8616 Personal history of COVID-19: Secondary | ICD-10-CM | POA: Diagnosis not present

## 2021-10-20 DIAGNOSIS — F411 Generalized anxiety disorder: Secondary | ICD-10-CM | POA: Diagnosis not present

## 2021-10-20 DIAGNOSIS — R41841 Cognitive communication deficit: Secondary | ICD-10-CM | POA: Diagnosis not present

## 2021-10-20 DIAGNOSIS — Z87440 Personal history of urinary (tract) infections: Secondary | ICD-10-CM | POA: Diagnosis not present

## 2021-10-20 DIAGNOSIS — D649 Anemia, unspecified: Secondary | ICD-10-CM | POA: Diagnosis not present

## 2021-10-20 DIAGNOSIS — R1312 Dysphagia, oropharyngeal phase: Secondary | ICD-10-CM | POA: Diagnosis not present

## 2021-10-20 DIAGNOSIS — D735 Infarction of spleen: Secondary | ICD-10-CM | POA: Diagnosis not present

## 2021-10-20 DIAGNOSIS — N1330 Unspecified hydronephrosis: Secondary | ICD-10-CM | POA: Diagnosis not present

## 2021-10-20 DIAGNOSIS — Z9049 Acquired absence of other specified parts of digestive tract: Secondary | ICD-10-CM | POA: Diagnosis not present

## 2021-10-20 DIAGNOSIS — I69320 Aphasia following cerebral infarction: Secondary | ICD-10-CM | POA: Diagnosis not present

## 2021-10-20 DIAGNOSIS — I63511 Cerebral infarction due to unspecified occlusion or stenosis of right middle cerebral artery: Secondary | ICD-10-CM | POA: Diagnosis not present

## 2021-10-20 DIAGNOSIS — I7409 Other arterial embolism and thrombosis of abdominal aorta: Secondary | ICD-10-CM | POA: Diagnosis not present

## 2021-10-20 DIAGNOSIS — G9341 Metabolic encephalopathy: Secondary | ICD-10-CM | POA: Diagnosis not present

## 2021-10-20 DIAGNOSIS — R7881 Bacteremia: Secondary | ICD-10-CM | POA: Diagnosis not present

## 2021-10-20 DIAGNOSIS — K219 Gastro-esophageal reflux disease without esophagitis: Secondary | ICD-10-CM | POA: Diagnosis not present

## 2021-10-20 DIAGNOSIS — I639 Cerebral infarction, unspecified: Secondary | ICD-10-CM | POA: Diagnosis not present

## 2021-10-20 DIAGNOSIS — Z9282 Status post administration of tPA (rtPA) in a different facility within the last 24 hours prior to admission to current facility: Secondary | ICD-10-CM | POA: Diagnosis not present

## 2021-10-20 DIAGNOSIS — M80061A Age-related osteoporosis with current pathological fracture, right lower leg, initial encounter for fracture: Secondary | ICD-10-CM | POA: Diagnosis not present

## 2021-10-20 DIAGNOSIS — Z7982 Long term (current) use of aspirin: Secondary | ICD-10-CM | POA: Diagnosis not present

## 2021-10-20 DIAGNOSIS — I129 Hypertensive chronic kidney disease with stage 1 through stage 4 chronic kidney disease, or unspecified chronic kidney disease: Secondary | ICD-10-CM | POA: Diagnosis not present

## 2021-10-20 DIAGNOSIS — D68312 Antiphospholipid antibody with hemorrhagic disorder: Secondary | ICD-10-CM | POA: Diagnosis not present

## 2021-10-20 DIAGNOSIS — Z79899 Other long term (current) drug therapy: Secondary | ICD-10-CM | POA: Diagnosis not present

## 2021-10-20 DIAGNOSIS — G9389 Other specified disorders of brain: Secondary | ICD-10-CM | POA: Diagnosis not present

## 2021-10-20 DIAGNOSIS — E039 Hypothyroidism, unspecified: Secondary | ICD-10-CM | POA: Diagnosis not present

## 2021-10-20 DIAGNOSIS — R2681 Unsteadiness on feet: Secondary | ICD-10-CM | POA: Diagnosis not present

## 2021-10-20 DIAGNOSIS — F32A Depression, unspecified: Secondary | ICD-10-CM | POA: Diagnosis not present

## 2021-10-20 DIAGNOSIS — F331 Major depressive disorder, recurrent, moderate: Secondary | ICD-10-CM | POA: Diagnosis not present

## 2021-10-20 DIAGNOSIS — B9561 Methicillin susceptible Staphylococcus aureus infection as the cause of diseases classified elsewhere: Secondary | ICD-10-CM | POA: Diagnosis not present

## 2021-10-20 DIAGNOSIS — Z7901 Long term (current) use of anticoagulants: Secondary | ICD-10-CM | POA: Diagnosis not present

## 2021-10-20 DIAGNOSIS — R7402 Elevation of levels of lactic acid dehydrogenase (LDH): Secondary | ICD-10-CM | POA: Diagnosis not present

## 2021-10-20 DIAGNOSIS — I959 Hypotension, unspecified: Secondary | ICD-10-CM | POA: Diagnosis not present

## 2021-10-20 DIAGNOSIS — Z7401 Bed confinement status: Secondary | ICD-10-CM | POA: Diagnosis not present

## 2021-10-23 DIAGNOSIS — R278 Other lack of coordination: Secondary | ICD-10-CM | POA: Diagnosis not present

## 2021-10-23 DIAGNOSIS — R2681 Unsteadiness on feet: Secondary | ICD-10-CM | POA: Diagnosis not present

## 2021-10-23 DIAGNOSIS — N179 Acute kidney failure, unspecified: Secondary | ICD-10-CM | POA: Diagnosis not present

## 2021-10-23 DIAGNOSIS — M6281 Muscle weakness (generalized): Secondary | ICD-10-CM | POA: Diagnosis not present

## 2021-10-23 DIAGNOSIS — I10 Essential (primary) hypertension: Secondary | ICD-10-CM | POA: Diagnosis not present

## 2021-10-23 DIAGNOSIS — E039 Hypothyroidism, unspecified: Secondary | ICD-10-CM | POA: Diagnosis not present

## 2021-10-23 DIAGNOSIS — F329 Major depressive disorder, single episode, unspecified: Secondary | ICD-10-CM | POA: Diagnosis not present

## 2021-10-23 DIAGNOSIS — E782 Mixed hyperlipidemia: Secondary | ICD-10-CM | POA: Diagnosis not present

## 2021-10-23 DIAGNOSIS — M80061A Age-related osteoporosis with current pathological fracture, right lower leg, initial encounter for fracture: Secondary | ICD-10-CM | POA: Diagnosis not present

## 2021-10-23 DIAGNOSIS — G9341 Metabolic encephalopathy: Secondary | ICD-10-CM | POA: Diagnosis not present

## 2021-10-23 DIAGNOSIS — D68312 Antiphospholipid antibody with hemorrhagic disorder: Secondary | ICD-10-CM | POA: Diagnosis not present

## 2021-10-23 DIAGNOSIS — L24A2 Irritant contact dermatitis due to fecal, urinary or dual incontinence: Secondary | ICD-10-CM | POA: Diagnosis not present

## 2021-10-23 DIAGNOSIS — I639 Cerebral infarction, unspecified: Secondary | ICD-10-CM | POA: Diagnosis not present

## 2021-10-24 DIAGNOSIS — I82401 Acute embolism and thrombosis of unspecified deep veins of right lower extremity: Secondary | ICD-10-CM | POA: Diagnosis not present

## 2021-10-24 DIAGNOSIS — E039 Hypothyroidism, unspecified: Secondary | ICD-10-CM | POA: Diagnosis not present

## 2021-10-24 DIAGNOSIS — I10 Essential (primary) hypertension: Secondary | ICD-10-CM | POA: Diagnosis not present

## 2021-10-25 DIAGNOSIS — R404 Transient alteration of awareness: Secondary | ICD-10-CM | POA: Diagnosis not present

## 2021-10-25 DIAGNOSIS — I959 Hypotension, unspecified: Secondary | ICD-10-CM | POA: Diagnosis not present

## 2021-10-25 DIAGNOSIS — D6861 Antiphospholipid syndrome: Secondary | ICD-10-CM | POA: Diagnosis not present

## 2021-10-25 DIAGNOSIS — R296 Repeated falls: Secondary | ICD-10-CM | POA: Diagnosis not present

## 2021-10-25 DIAGNOSIS — N390 Urinary tract infection, site not specified: Secondary | ICD-10-CM | POA: Diagnosis not present

## 2021-10-25 DIAGNOSIS — R718 Other abnormality of red blood cells: Secondary | ICD-10-CM | POA: Diagnosis not present

## 2021-10-25 DIAGNOSIS — Z79899 Other long term (current) drug therapy: Secondary | ICD-10-CM | POA: Diagnosis not present

## 2021-10-25 DIAGNOSIS — R4701 Aphasia: Secondary | ICD-10-CM | POA: Diagnosis not present

## 2021-10-25 DIAGNOSIS — I10 Essential (primary) hypertension: Secondary | ICD-10-CM | POA: Diagnosis not present

## 2021-10-25 DIAGNOSIS — D649 Anemia, unspecified: Secondary | ICD-10-CM | POA: Diagnosis not present

## 2021-10-25 DIAGNOSIS — Z880 Allergy status to penicillin: Secondary | ICD-10-CM | POA: Diagnosis not present

## 2021-10-25 DIAGNOSIS — R41841 Cognitive communication deficit: Secondary | ICD-10-CM | POA: Diagnosis not present

## 2021-10-25 DIAGNOSIS — Z9049 Acquired absence of other specified parts of digestive tract: Secondary | ICD-10-CM | POA: Diagnosis not present

## 2021-10-25 DIAGNOSIS — I429 Cardiomyopathy, unspecified: Secondary | ICD-10-CM | POA: Diagnosis not present

## 2021-10-25 DIAGNOSIS — G3184 Mild cognitive impairment, so stated: Secondary | ICD-10-CM | POA: Diagnosis not present

## 2021-10-25 DIAGNOSIS — G9389 Other specified disorders of brain: Secondary | ICD-10-CM | POA: Diagnosis not present

## 2021-10-25 DIAGNOSIS — Z87440 Personal history of urinary (tract) infections: Secondary | ICD-10-CM | POA: Diagnosis not present

## 2021-10-25 DIAGNOSIS — Z20822 Contact with and (suspected) exposure to covid-19: Secondary | ICD-10-CM | POA: Diagnosis not present

## 2021-10-25 DIAGNOSIS — I69351 Hemiplegia and hemiparesis following cerebral infarction affecting right dominant side: Secondary | ICD-10-CM | POA: Diagnosis not present

## 2021-10-25 DIAGNOSIS — I69320 Aphasia following cerebral infarction: Secondary | ICD-10-CM | POA: Diagnosis not present

## 2021-10-25 DIAGNOSIS — R278 Other lack of coordination: Secondary | ICD-10-CM | POA: Diagnosis not present

## 2021-10-25 DIAGNOSIS — Z9282 Status post administration of tPA (rtPA) in a different facility within the last 24 hours prior to admission to current facility: Secondary | ICD-10-CM | POA: Diagnosis not present

## 2021-10-25 DIAGNOSIS — F418 Other specified anxiety disorders: Secondary | ICD-10-CM | POA: Diagnosis not present

## 2021-10-25 DIAGNOSIS — I82401 Acute embolism and thrombosis of unspecified deep veins of right lower extremity: Secondary | ICD-10-CM | POA: Diagnosis not present

## 2021-10-25 DIAGNOSIS — E039 Hypothyroidism, unspecified: Secondary | ICD-10-CM | POA: Diagnosis not present

## 2021-10-25 DIAGNOSIS — N319 Neuromuscular dysfunction of bladder, unspecified: Secondary | ICD-10-CM | POA: Diagnosis not present

## 2021-10-25 DIAGNOSIS — N1330 Unspecified hydronephrosis: Secondary | ICD-10-CM | POA: Diagnosis not present

## 2021-10-25 DIAGNOSIS — R2681 Unsteadiness on feet: Secondary | ICD-10-CM | POA: Diagnosis not present

## 2021-10-25 DIAGNOSIS — F411 Generalized anxiety disorder: Secondary | ICD-10-CM | POA: Diagnosis not present

## 2021-10-25 DIAGNOSIS — H547 Unspecified visual loss: Secondary | ICD-10-CM | POA: Diagnosis not present

## 2021-10-25 DIAGNOSIS — R531 Weakness: Secondary | ICD-10-CM | POA: Diagnosis not present

## 2021-10-25 DIAGNOSIS — N189 Chronic kidney disease, unspecified: Secondary | ICD-10-CM | POA: Diagnosis not present

## 2021-10-25 DIAGNOSIS — R1312 Dysphagia, oropharyngeal phase: Secondary | ICD-10-CM | POA: Diagnosis not present

## 2021-10-25 DIAGNOSIS — I129 Hypertensive chronic kidney disease with stage 1 through stage 4 chronic kidney disease, or unspecified chronic kidney disease: Secondary | ICD-10-CM | POA: Diagnosis not present

## 2021-10-25 DIAGNOSIS — N201 Calculus of ureter: Secondary | ICD-10-CM | POA: Diagnosis not present

## 2021-10-25 DIAGNOSIS — R29715 NIHSS score 15: Secondary | ICD-10-CM | POA: Diagnosis not present

## 2021-10-25 DIAGNOSIS — R413 Other amnesia: Secondary | ICD-10-CM | POA: Diagnosis not present

## 2021-10-25 DIAGNOSIS — I639 Cerebral infarction, unspecified: Secondary | ICD-10-CM | POA: Diagnosis not present

## 2021-10-25 DIAGNOSIS — Z7901 Long term (current) use of anticoagulants: Secondary | ICD-10-CM | POA: Diagnosis not present

## 2021-10-25 DIAGNOSIS — Z8673 Personal history of transient ischemic attack (TIA), and cerebral infarction without residual deficits: Secondary | ICD-10-CM | POA: Diagnosis not present

## 2021-10-25 DIAGNOSIS — Z01818 Encounter for other preprocedural examination: Secondary | ICD-10-CM | POA: Diagnosis not present

## 2021-10-25 DIAGNOSIS — I69391 Dysphagia following cerebral infarction: Secondary | ICD-10-CM | POA: Diagnosis not present

## 2021-10-25 DIAGNOSIS — Z7982 Long term (current) use of aspirin: Secondary | ICD-10-CM | POA: Diagnosis not present

## 2021-10-25 DIAGNOSIS — R471 Dysarthria and anarthria: Secondary | ICD-10-CM | POA: Diagnosis not present

## 2021-10-25 DIAGNOSIS — I63512 Cerebral infarction due to unspecified occlusion or stenosis of left middle cerebral artery: Secondary | ICD-10-CM | POA: Diagnosis not present

## 2021-10-25 DIAGNOSIS — E46 Unspecified protein-calorie malnutrition: Secondary | ICD-10-CM | POA: Diagnosis not present

## 2021-10-25 DIAGNOSIS — I517 Cardiomegaly: Secondary | ICD-10-CM | POA: Diagnosis not present

## 2021-10-25 DIAGNOSIS — F32A Depression, unspecified: Secondary | ICD-10-CM | POA: Diagnosis not present

## 2021-10-25 DIAGNOSIS — I63511 Cerebral infarction due to unspecified occlusion or stenosis of right middle cerebral artery: Secondary | ICD-10-CM | POA: Diagnosis not present

## 2021-10-25 DIAGNOSIS — I6389 Other cerebral infarction: Secondary | ICD-10-CM | POA: Diagnosis not present

## 2021-10-25 DIAGNOSIS — N179 Acute kidney failure, unspecified: Secondary | ICD-10-CM | POA: Diagnosis not present

## 2021-10-25 DIAGNOSIS — N132 Hydronephrosis with renal and ureteral calculous obstruction: Secondary | ICD-10-CM | POA: Diagnosis not present

## 2021-10-25 DIAGNOSIS — M810 Age-related osteoporosis without current pathological fracture: Secondary | ICD-10-CM | POA: Diagnosis not present

## 2021-10-25 DIAGNOSIS — E7801 Familial hypercholesterolemia: Secondary | ICD-10-CM | POA: Diagnosis not present

## 2021-10-25 DIAGNOSIS — Z7401 Bed confinement status: Secondary | ICD-10-CM | POA: Diagnosis not present

## 2021-10-25 DIAGNOSIS — I6523 Occlusion and stenosis of bilateral carotid arteries: Secondary | ICD-10-CM | POA: Diagnosis not present

## 2021-10-25 DIAGNOSIS — R2689 Other abnormalities of gait and mobility: Secondary | ICD-10-CM | POA: Diagnosis not present

## 2021-10-25 DIAGNOSIS — N200 Calculus of kidney: Secondary | ICD-10-CM | POA: Diagnosis not present

## 2021-10-25 DIAGNOSIS — B9561 Methicillin susceptible Staphylococcus aureus infection as the cause of diseases classified elsewhere: Secondary | ICD-10-CM | POA: Diagnosis not present

## 2021-10-25 DIAGNOSIS — K219 Gastro-esophageal reflux disease without esophagitis: Secondary | ICD-10-CM | POA: Diagnosis not present

## 2021-10-25 DIAGNOSIS — R7881 Bacteremia: Secondary | ICD-10-CM | POA: Diagnosis not present

## 2021-10-27 DIAGNOSIS — I82401 Acute embolism and thrombosis of unspecified deep veins of right lower extremity: Secondary | ICD-10-CM | POA: Diagnosis not present

## 2021-10-27 DIAGNOSIS — E039 Hypothyroidism, unspecified: Secondary | ICD-10-CM | POA: Diagnosis not present

## 2021-10-27 DIAGNOSIS — I10 Essential (primary) hypertension: Secondary | ICD-10-CM | POA: Diagnosis not present

## 2021-10-31 DIAGNOSIS — N201 Calculus of ureter: Secondary | ICD-10-CM | POA: Diagnosis not present

## 2021-11-08 DIAGNOSIS — I679 Cerebrovascular disease, unspecified: Secondary | ICD-10-CM | POA: Diagnosis not present

## 2021-11-08 DIAGNOSIS — I7409 Other arterial embolism and thrombosis of abdominal aorta: Secondary | ICD-10-CM | POA: Diagnosis not present

## 2021-11-08 DIAGNOSIS — M84471D Pathological fracture, right ankle, subsequent encounter for fracture with routine healing: Secondary | ICD-10-CM | POA: Diagnosis not present

## 2021-11-08 DIAGNOSIS — F5105 Insomnia due to other mental disorder: Secondary | ICD-10-CM | POA: Diagnosis not present

## 2021-11-08 DIAGNOSIS — F01B4 Vascular dementia, moderate, with anxiety: Secondary | ICD-10-CM | POA: Diagnosis not present

## 2021-11-08 DIAGNOSIS — I10 Essential (primary) hypertension: Secondary | ICD-10-CM | POA: Diagnosis not present

## 2021-11-08 DIAGNOSIS — F3289 Other specified depressive episodes: Secondary | ICD-10-CM | POA: Diagnosis not present

## 2021-11-08 DIAGNOSIS — R296 Repeated falls: Secondary | ICD-10-CM | POA: Diagnosis not present

## 2021-11-08 DIAGNOSIS — M810 Age-related osteoporosis without current pathological fracture: Secondary | ICD-10-CM | POA: Diagnosis not present

## 2021-11-08 DIAGNOSIS — D6861 Antiphospholipid syndrome: Secondary | ICD-10-CM | POA: Diagnosis not present

## 2021-11-08 DIAGNOSIS — D6832 Hemorrhagic disorder due to extrinsic circulating anticoagulants: Secondary | ICD-10-CM | POA: Diagnosis not present

## 2021-11-08 DIAGNOSIS — R451 Restlessness and agitation: Secondary | ICD-10-CM | POA: Diagnosis not present

## 2021-11-08 DIAGNOSIS — G4700 Insomnia, unspecified: Secondary | ICD-10-CM | POA: Diagnosis not present

## 2021-11-08 DIAGNOSIS — R32 Unspecified urinary incontinence: Secondary | ICD-10-CM | POA: Diagnosis not present

## 2021-11-08 DIAGNOSIS — R1312 Dysphagia, oropharyngeal phase: Secondary | ICD-10-CM | POA: Diagnosis not present

## 2021-11-08 DIAGNOSIS — I82401 Acute embolism and thrombosis of unspecified deep veins of right lower extremity: Secondary | ICD-10-CM | POA: Diagnosis not present

## 2021-11-08 DIAGNOSIS — R4182 Altered mental status, unspecified: Secondary | ICD-10-CM | POA: Diagnosis not present

## 2021-11-08 DIAGNOSIS — I69315 Cognitive social or emotional deficit following cerebral infarction: Secondary | ICD-10-CM | POA: Diagnosis not present

## 2021-11-08 DIAGNOSIS — F419 Anxiety disorder, unspecified: Secondary | ICD-10-CM | POA: Diagnosis not present

## 2021-11-08 DIAGNOSIS — I2510 Atherosclerotic heart disease of native coronary artery without angina pectoris: Secondary | ICD-10-CM | POA: Diagnosis not present

## 2021-11-08 DIAGNOSIS — R1030 Lower abdominal pain, unspecified: Secondary | ICD-10-CM | POA: Diagnosis not present

## 2021-11-08 DIAGNOSIS — M79641 Pain in right hand: Secondary | ICD-10-CM | POA: Diagnosis not present

## 2021-11-08 DIAGNOSIS — I6350 Cerebral infarction due to unspecified occlusion or stenosis of unspecified cerebral artery: Secondary | ICD-10-CM | POA: Diagnosis not present

## 2021-11-08 DIAGNOSIS — R4701 Aphasia: Secondary | ICD-10-CM | POA: Diagnosis not present

## 2021-11-08 DIAGNOSIS — N189 Chronic kidney disease, unspecified: Secondary | ICD-10-CM | POA: Diagnosis not present

## 2021-11-08 DIAGNOSIS — M80061A Age-related osteoporosis with current pathological fracture, right lower leg, initial encounter for fracture: Secondary | ICD-10-CM | POA: Diagnosis not present

## 2021-11-08 DIAGNOSIS — Z23 Encounter for immunization: Secondary | ICD-10-CM | POA: Diagnosis not present

## 2021-11-08 DIAGNOSIS — Z8673 Personal history of transient ischemic attack (TIA), and cerebral infarction without residual deficits: Secondary | ICD-10-CM | POA: Diagnosis not present

## 2021-11-08 DIAGNOSIS — R2681 Unsteadiness on feet: Secondary | ICD-10-CM | POA: Diagnosis not present

## 2021-11-08 DIAGNOSIS — R41841 Cognitive communication deficit: Secondary | ICD-10-CM | POA: Diagnosis not present

## 2021-11-08 DIAGNOSIS — Z9181 History of falling: Secondary | ICD-10-CM | POA: Diagnosis not present

## 2021-11-08 DIAGNOSIS — H47619 Cortical blindness, unspecified side of brain: Secondary | ICD-10-CM | POA: Diagnosis not present

## 2021-11-08 DIAGNOSIS — D68312 Antiphospholipid antibody with hemorrhagic disorder: Secondary | ICD-10-CM | POA: Diagnosis not present

## 2021-11-08 DIAGNOSIS — R2689 Other abnormalities of gait and mobility: Secondary | ICD-10-CM | POA: Diagnosis not present

## 2021-11-08 DIAGNOSIS — R278 Other lack of coordination: Secondary | ICD-10-CM | POA: Diagnosis not present

## 2021-11-08 DIAGNOSIS — F5104 Psychophysiologic insomnia: Secondary | ICD-10-CM | POA: Diagnosis not present

## 2021-11-08 DIAGNOSIS — Z7901 Long term (current) use of anticoagulants: Secondary | ICD-10-CM | POA: Diagnosis not present

## 2021-11-08 DIAGNOSIS — N200 Calculus of kidney: Secondary | ICD-10-CM | POA: Diagnosis not present

## 2021-11-08 DIAGNOSIS — K219 Gastro-esophageal reflux disease without esophagitis: Secondary | ICD-10-CM | POA: Diagnosis not present

## 2021-11-08 DIAGNOSIS — N390 Urinary tract infection, site not specified: Secondary | ICD-10-CM | POA: Diagnosis not present

## 2021-11-08 DIAGNOSIS — E782 Mixed hyperlipidemia: Secondary | ICD-10-CM | POA: Diagnosis not present

## 2021-11-08 DIAGNOSIS — E46 Unspecified protein-calorie malnutrition: Secondary | ICD-10-CM | POA: Diagnosis not present

## 2021-11-08 DIAGNOSIS — F331 Major depressive disorder, recurrent, moderate: Secondary | ICD-10-CM | POA: Diagnosis not present

## 2021-11-08 DIAGNOSIS — N319 Neuromuscular dysfunction of bladder, unspecified: Secondary | ICD-10-CM | POA: Diagnosis not present

## 2021-11-08 DIAGNOSIS — Z7401 Bed confinement status: Secondary | ICD-10-CM | POA: Diagnosis not present

## 2021-11-08 DIAGNOSIS — R453 Demoralization and apathy: Secondary | ICD-10-CM | POA: Diagnosis not present

## 2021-11-08 DIAGNOSIS — Z20828 Contact with and (suspected) exposure to other viral communicable diseases: Secondary | ICD-10-CM | POA: Diagnosis not present

## 2021-11-08 DIAGNOSIS — E039 Hypothyroidism, unspecified: Secondary | ICD-10-CM | POA: Diagnosis not present

## 2021-11-08 DIAGNOSIS — I69391 Dysphagia following cerebral infarction: Secondary | ICD-10-CM | POA: Diagnosis not present

## 2021-11-08 DIAGNOSIS — G9341 Metabolic encephalopathy: Secondary | ICD-10-CM | POA: Diagnosis not present

## 2021-11-08 DIAGNOSIS — I69320 Aphasia following cerebral infarction: Secondary | ICD-10-CM | POA: Diagnosis not present

## 2021-11-08 DIAGNOSIS — R404 Transient alteration of awareness: Secondary | ICD-10-CM | POA: Diagnosis not present

## 2021-11-08 DIAGNOSIS — I639 Cerebral infarction, unspecified: Secondary | ICD-10-CM | POA: Diagnosis not present

## 2021-11-08 DIAGNOSIS — I69351 Hemiplegia and hemiparesis following cerebral infarction affecting right dominant side: Secondary | ICD-10-CM | POA: Diagnosis not present

## 2021-11-08 DIAGNOSIS — F329 Major depressive disorder, single episode, unspecified: Secondary | ICD-10-CM | POA: Diagnosis not present

## 2021-11-08 DIAGNOSIS — M6281 Muscle weakness (generalized): Secondary | ICD-10-CM | POA: Diagnosis not present

## 2021-11-08 DIAGNOSIS — N202 Calculus of kidney with calculus of ureter: Secondary | ICD-10-CM | POA: Diagnosis not present

## 2021-11-08 DIAGNOSIS — N179 Acute kidney failure, unspecified: Secondary | ICD-10-CM | POA: Diagnosis not present

## 2021-11-08 DIAGNOSIS — N132 Hydronephrosis with renal and ureteral calculous obstruction: Secondary | ICD-10-CM | POA: Diagnosis not present

## 2021-11-08 DIAGNOSIS — F0150 Vascular dementia without behavioral disturbance: Secondary | ICD-10-CM | POA: Diagnosis not present

## 2021-11-08 DIAGNOSIS — R609 Edema, unspecified: Secondary | ICD-10-CM | POA: Diagnosis not present

## 2021-11-09 DIAGNOSIS — G9341 Metabolic encephalopathy: Secondary | ICD-10-CM | POA: Diagnosis not present

## 2021-11-09 DIAGNOSIS — E782 Mixed hyperlipidemia: Secondary | ICD-10-CM | POA: Diagnosis not present

## 2021-11-09 DIAGNOSIS — N179 Acute kidney failure, unspecified: Secondary | ICD-10-CM | POA: Diagnosis not present

## 2021-11-09 DIAGNOSIS — I10 Essential (primary) hypertension: Secondary | ICD-10-CM | POA: Diagnosis not present

## 2021-11-09 DIAGNOSIS — F5105 Insomnia due to other mental disorder: Secondary | ICD-10-CM | POA: Diagnosis not present

## 2021-11-09 DIAGNOSIS — M6281 Muscle weakness (generalized): Secondary | ICD-10-CM | POA: Diagnosis not present

## 2021-11-09 DIAGNOSIS — I639 Cerebral infarction, unspecified: Secondary | ICD-10-CM | POA: Diagnosis not present

## 2021-11-09 DIAGNOSIS — R278 Other lack of coordination: Secondary | ICD-10-CM | POA: Diagnosis not present

## 2021-11-09 DIAGNOSIS — E039 Hypothyroidism, unspecified: Secondary | ICD-10-CM | POA: Diagnosis not present

## 2021-11-09 DIAGNOSIS — F329 Major depressive disorder, single episode, unspecified: Secondary | ICD-10-CM | POA: Diagnosis not present

## 2021-11-09 DIAGNOSIS — F331 Major depressive disorder, recurrent, moderate: Secondary | ICD-10-CM | POA: Diagnosis not present

## 2021-11-09 DIAGNOSIS — D68312 Antiphospholipid antibody with hemorrhagic disorder: Secondary | ICD-10-CM | POA: Diagnosis not present

## 2021-11-09 DIAGNOSIS — F01B4 Vascular dementia, moderate, with anxiety: Secondary | ICD-10-CM | POA: Diagnosis not present

## 2021-11-09 DIAGNOSIS — M80061A Age-related osteoporosis with current pathological fracture, right lower leg, initial encounter for fracture: Secondary | ICD-10-CM | POA: Diagnosis not present

## 2021-11-09 DIAGNOSIS — R2681 Unsteadiness on feet: Secondary | ICD-10-CM | POA: Diagnosis not present

## 2021-11-13 DIAGNOSIS — R2681 Unsteadiness on feet: Secondary | ICD-10-CM | POA: Diagnosis not present

## 2021-11-13 DIAGNOSIS — N179 Acute kidney failure, unspecified: Secondary | ICD-10-CM | POA: Diagnosis not present

## 2021-11-13 DIAGNOSIS — F329 Major depressive disorder, single episode, unspecified: Secondary | ICD-10-CM | POA: Diagnosis not present

## 2021-11-13 DIAGNOSIS — I639 Cerebral infarction, unspecified: Secondary | ICD-10-CM | POA: Diagnosis not present

## 2021-11-13 DIAGNOSIS — G9341 Metabolic encephalopathy: Secondary | ICD-10-CM | POA: Diagnosis not present

## 2021-11-13 DIAGNOSIS — E039 Hypothyroidism, unspecified: Secondary | ICD-10-CM | POA: Diagnosis not present

## 2021-11-13 DIAGNOSIS — I10 Essential (primary) hypertension: Secondary | ICD-10-CM | POA: Diagnosis not present

## 2021-11-13 DIAGNOSIS — R278 Other lack of coordination: Secondary | ICD-10-CM | POA: Diagnosis not present

## 2021-11-13 DIAGNOSIS — M80061A Age-related osteoporosis with current pathological fracture, right lower leg, initial encounter for fracture: Secondary | ICD-10-CM | POA: Diagnosis not present

## 2021-11-13 DIAGNOSIS — M6281 Muscle weakness (generalized): Secondary | ICD-10-CM | POA: Diagnosis not present

## 2021-11-13 DIAGNOSIS — D68312 Antiphospholipid antibody with hemorrhagic disorder: Secondary | ICD-10-CM | POA: Diagnosis not present

## 2021-11-13 DIAGNOSIS — E782 Mixed hyperlipidemia: Secondary | ICD-10-CM | POA: Diagnosis not present

## 2021-11-14 DIAGNOSIS — I10 Essential (primary) hypertension: Secondary | ICD-10-CM | POA: Diagnosis not present

## 2021-11-14 DIAGNOSIS — E039 Hypothyroidism, unspecified: Secondary | ICD-10-CM | POA: Diagnosis not present

## 2021-11-14 DIAGNOSIS — I82401 Acute embolism and thrombosis of unspecified deep veins of right lower extremity: Secondary | ICD-10-CM | POA: Diagnosis not present

## 2021-11-16 DIAGNOSIS — I2510 Atherosclerotic heart disease of native coronary artery without angina pectoris: Secondary | ICD-10-CM | POA: Diagnosis not present

## 2021-11-16 DIAGNOSIS — R2681 Unsteadiness on feet: Secondary | ICD-10-CM | POA: Diagnosis not present

## 2021-11-16 DIAGNOSIS — I69315 Cognitive social or emotional deficit following cerebral infarction: Secondary | ICD-10-CM | POA: Diagnosis not present

## 2021-11-16 DIAGNOSIS — R1312 Dysphagia, oropharyngeal phase: Secondary | ICD-10-CM | POA: Diagnosis not present

## 2021-11-16 DIAGNOSIS — E039 Hypothyroidism, unspecified: Secondary | ICD-10-CM | POA: Diagnosis not present

## 2021-11-16 DIAGNOSIS — F0150 Vascular dementia without behavioral disturbance: Secondary | ICD-10-CM | POA: Diagnosis not present

## 2021-11-16 DIAGNOSIS — I10 Essential (primary) hypertension: Secondary | ICD-10-CM | POA: Diagnosis not present

## 2021-11-16 DIAGNOSIS — H47619 Cortical blindness, unspecified side of brain: Secondary | ICD-10-CM | POA: Diagnosis not present

## 2021-11-16 DIAGNOSIS — F329 Major depressive disorder, single episode, unspecified: Secondary | ICD-10-CM | POA: Diagnosis not present

## 2021-11-16 DIAGNOSIS — M80061A Age-related osteoporosis with current pathological fracture, right lower leg, initial encounter for fracture: Secondary | ICD-10-CM | POA: Diagnosis not present

## 2021-11-16 DIAGNOSIS — R41841 Cognitive communication deficit: Secondary | ICD-10-CM | POA: Diagnosis not present

## 2021-11-16 DIAGNOSIS — E782 Mixed hyperlipidemia: Secondary | ICD-10-CM | POA: Diagnosis not present

## 2021-11-16 DIAGNOSIS — G9341 Metabolic encephalopathy: Secondary | ICD-10-CM | POA: Diagnosis not present

## 2021-11-16 DIAGNOSIS — R4182 Altered mental status, unspecified: Secondary | ICD-10-CM | POA: Diagnosis not present

## 2021-11-16 DIAGNOSIS — I639 Cerebral infarction, unspecified: Secondary | ICD-10-CM | POA: Diagnosis not present

## 2021-11-16 DIAGNOSIS — R453 Demoralization and apathy: Secondary | ICD-10-CM | POA: Diagnosis not present

## 2021-11-16 DIAGNOSIS — I82401 Acute embolism and thrombosis of unspecified deep veins of right lower extremity: Secondary | ICD-10-CM | POA: Diagnosis not present

## 2021-11-16 DIAGNOSIS — D68312 Antiphospholipid antibody with hemorrhagic disorder: Secondary | ICD-10-CM | POA: Diagnosis not present

## 2021-11-16 DIAGNOSIS — Z9181 History of falling: Secondary | ICD-10-CM | POA: Diagnosis not present

## 2021-11-16 DIAGNOSIS — I679 Cerebrovascular disease, unspecified: Secondary | ICD-10-CM | POA: Diagnosis not present

## 2021-11-16 DIAGNOSIS — G4700 Insomnia, unspecified: Secondary | ICD-10-CM | POA: Diagnosis not present

## 2021-11-16 DIAGNOSIS — N189 Chronic kidney disease, unspecified: Secondary | ICD-10-CM | POA: Diagnosis not present

## 2021-11-16 DIAGNOSIS — F3289 Other specified depressive episodes: Secondary | ICD-10-CM | POA: Diagnosis not present

## 2021-11-16 DIAGNOSIS — E46 Unspecified protein-calorie malnutrition: Secondary | ICD-10-CM | POA: Diagnosis not present

## 2021-11-16 DIAGNOSIS — M810 Age-related osteoporosis without current pathological fracture: Secondary | ICD-10-CM | POA: Diagnosis not present

## 2021-11-16 DIAGNOSIS — I6350 Cerebral infarction due to unspecified occlusion or stenosis of unspecified cerebral artery: Secondary | ICD-10-CM | POA: Diagnosis not present

## 2021-11-16 DIAGNOSIS — R278 Other lack of coordination: Secondary | ICD-10-CM | POA: Diagnosis not present

## 2021-11-16 DIAGNOSIS — R1030 Lower abdominal pain, unspecified: Secondary | ICD-10-CM | POA: Diagnosis not present

## 2021-11-16 DIAGNOSIS — M6281 Muscle weakness (generalized): Secondary | ICD-10-CM | POA: Diagnosis not present

## 2021-11-16 DIAGNOSIS — N179 Acute kidney failure, unspecified: Secondary | ICD-10-CM | POA: Diagnosis not present

## 2021-11-19 DIAGNOSIS — Z20828 Contact with and (suspected) exposure to other viral communicable diseases: Secondary | ICD-10-CM | POA: Diagnosis not present

## 2021-11-20 DIAGNOSIS — E039 Hypothyroidism, unspecified: Secondary | ICD-10-CM | POA: Diagnosis not present

## 2021-11-20 DIAGNOSIS — N179 Acute kidney failure, unspecified: Secondary | ICD-10-CM | POA: Diagnosis not present

## 2021-11-20 DIAGNOSIS — R296 Repeated falls: Secondary | ICD-10-CM | POA: Diagnosis not present

## 2021-11-20 DIAGNOSIS — D68312 Antiphospholipid antibody with hemorrhagic disorder: Secondary | ICD-10-CM | POA: Diagnosis not present

## 2021-11-20 DIAGNOSIS — E782 Mixed hyperlipidemia: Secondary | ICD-10-CM | POA: Diagnosis not present

## 2021-11-20 DIAGNOSIS — I639 Cerebral infarction, unspecified: Secondary | ICD-10-CM | POA: Diagnosis not present

## 2021-11-20 DIAGNOSIS — G9341 Metabolic encephalopathy: Secondary | ICD-10-CM | POA: Diagnosis not present

## 2021-11-20 DIAGNOSIS — F329 Major depressive disorder, single episode, unspecified: Secondary | ICD-10-CM | POA: Diagnosis not present

## 2021-11-20 DIAGNOSIS — M80061A Age-related osteoporosis with current pathological fracture, right lower leg, initial encounter for fracture: Secondary | ICD-10-CM | POA: Diagnosis not present

## 2021-11-20 DIAGNOSIS — R2681 Unsteadiness on feet: Secondary | ICD-10-CM | POA: Diagnosis not present

## 2021-11-20 DIAGNOSIS — R278 Other lack of coordination: Secondary | ICD-10-CM | POA: Diagnosis not present

## 2021-11-20 DIAGNOSIS — R1312 Dysphagia, oropharyngeal phase: Secondary | ICD-10-CM | POA: Diagnosis not present

## 2021-11-20 DIAGNOSIS — R609 Edema, unspecified: Secondary | ICD-10-CM | POA: Diagnosis not present

## 2021-11-20 DIAGNOSIS — I10 Essential (primary) hypertension: Secondary | ICD-10-CM | POA: Diagnosis not present

## 2021-11-20 DIAGNOSIS — M6281 Muscle weakness (generalized): Secondary | ICD-10-CM | POA: Diagnosis not present

## 2021-11-21 DIAGNOSIS — I10 Essential (primary) hypertension: Secondary | ICD-10-CM | POA: Diagnosis not present

## 2021-11-21 DIAGNOSIS — I82401 Acute embolism and thrombosis of unspecified deep veins of right lower extremity: Secondary | ICD-10-CM | POA: Diagnosis not present

## 2021-11-21 DIAGNOSIS — R1312 Dysphagia, oropharyngeal phase: Secondary | ICD-10-CM | POA: Diagnosis not present

## 2021-11-21 DIAGNOSIS — R609 Edema, unspecified: Secondary | ICD-10-CM | POA: Diagnosis not present

## 2021-11-22 DIAGNOSIS — I10 Essential (primary) hypertension: Secondary | ICD-10-CM | POA: Diagnosis not present

## 2021-11-22 DIAGNOSIS — F329 Major depressive disorder, single episode, unspecified: Secondary | ICD-10-CM | POA: Diagnosis not present

## 2021-11-22 DIAGNOSIS — M80061A Age-related osteoporosis with current pathological fracture, right lower leg, initial encounter for fracture: Secondary | ICD-10-CM | POA: Diagnosis not present

## 2021-11-22 DIAGNOSIS — R2681 Unsteadiness on feet: Secondary | ICD-10-CM | POA: Diagnosis not present

## 2021-11-22 DIAGNOSIS — D68312 Antiphospholipid antibody with hemorrhagic disorder: Secondary | ICD-10-CM | POA: Diagnosis not present

## 2021-11-22 DIAGNOSIS — N179 Acute kidney failure, unspecified: Secondary | ICD-10-CM | POA: Diagnosis not present

## 2021-11-22 DIAGNOSIS — R278 Other lack of coordination: Secondary | ICD-10-CM | POA: Diagnosis not present

## 2021-11-22 DIAGNOSIS — E039 Hypothyroidism, unspecified: Secondary | ICD-10-CM | POA: Diagnosis not present

## 2021-11-22 DIAGNOSIS — E782 Mixed hyperlipidemia: Secondary | ICD-10-CM | POA: Diagnosis not present

## 2021-11-22 DIAGNOSIS — G9341 Metabolic encephalopathy: Secondary | ICD-10-CM | POA: Diagnosis not present

## 2021-11-22 DIAGNOSIS — M6281 Muscle weakness (generalized): Secondary | ICD-10-CM | POA: Diagnosis not present

## 2021-11-22 DIAGNOSIS — I639 Cerebral infarction, unspecified: Secondary | ICD-10-CM | POA: Diagnosis not present

## 2021-11-23 DIAGNOSIS — R1312 Dysphagia, oropharyngeal phase: Secondary | ICD-10-CM | POA: Diagnosis not present

## 2021-11-23 DIAGNOSIS — I82401 Acute embolism and thrombosis of unspecified deep veins of right lower extremity: Secondary | ICD-10-CM | POA: Diagnosis not present

## 2021-11-23 DIAGNOSIS — I10 Essential (primary) hypertension: Secondary | ICD-10-CM | POA: Diagnosis not present

## 2021-11-23 DIAGNOSIS — R609 Edema, unspecified: Secondary | ICD-10-CM | POA: Diagnosis not present

## 2021-11-25 DIAGNOSIS — I82401 Acute embolism and thrombosis of unspecified deep veins of right lower extremity: Secondary | ICD-10-CM | POA: Diagnosis not present

## 2021-11-27 DIAGNOSIS — N189 Chronic kidney disease, unspecified: Secondary | ICD-10-CM | POA: Diagnosis not present

## 2021-11-27 DIAGNOSIS — E039 Hypothyroidism, unspecified: Secondary | ICD-10-CM | POA: Diagnosis not present

## 2021-11-27 DIAGNOSIS — M84471D Pathological fracture, right ankle, subsequent encounter for fracture with routine healing: Secondary | ICD-10-CM | POA: Diagnosis not present

## 2021-11-27 DIAGNOSIS — I7409 Other arterial embolism and thrombosis of abdominal aorta: Secondary | ICD-10-CM | POA: Diagnosis not present

## 2021-11-27 DIAGNOSIS — I82401 Acute embolism and thrombosis of unspecified deep veins of right lower extremity: Secondary | ICD-10-CM | POA: Diagnosis not present

## 2021-11-27 DIAGNOSIS — E46 Unspecified protein-calorie malnutrition: Secondary | ICD-10-CM | POA: Diagnosis not present

## 2021-11-27 DIAGNOSIS — I6350 Cerebral infarction due to unspecified occlusion or stenosis of unspecified cerebral artery: Secondary | ICD-10-CM | POA: Diagnosis not present

## 2021-11-28 DIAGNOSIS — R1312 Dysphagia, oropharyngeal phase: Secondary | ICD-10-CM | POA: Diagnosis not present

## 2021-11-28 DIAGNOSIS — N189 Chronic kidney disease, unspecified: Secondary | ICD-10-CM | POA: Diagnosis not present

## 2021-11-28 DIAGNOSIS — E46 Unspecified protein-calorie malnutrition: Secondary | ICD-10-CM | POA: Diagnosis not present

## 2021-11-28 DIAGNOSIS — I82401 Acute embolism and thrombosis of unspecified deep veins of right lower extremity: Secondary | ICD-10-CM | POA: Diagnosis not present

## 2021-11-30 DIAGNOSIS — E46 Unspecified protein-calorie malnutrition: Secondary | ICD-10-CM | POA: Diagnosis not present

## 2021-11-30 DIAGNOSIS — I82401 Acute embolism and thrombosis of unspecified deep veins of right lower extremity: Secondary | ICD-10-CM | POA: Diagnosis not present

## 2021-11-30 DIAGNOSIS — N189 Chronic kidney disease, unspecified: Secondary | ICD-10-CM | POA: Diagnosis not present

## 2021-11-30 DIAGNOSIS — R1312 Dysphagia, oropharyngeal phase: Secondary | ICD-10-CM | POA: Diagnosis not present

## 2021-12-03 DIAGNOSIS — F5105 Insomnia due to other mental disorder: Secondary | ICD-10-CM | POA: Diagnosis not present

## 2021-12-03 DIAGNOSIS — I82401 Acute embolism and thrombosis of unspecified deep veins of right lower extremity: Secondary | ICD-10-CM | POA: Diagnosis not present

## 2021-12-03 DIAGNOSIS — R278 Other lack of coordination: Secondary | ICD-10-CM | POA: Diagnosis not present

## 2021-12-03 DIAGNOSIS — G9341 Metabolic encephalopathy: Secondary | ICD-10-CM | POA: Diagnosis not present

## 2021-12-03 DIAGNOSIS — F329 Major depressive disorder, single episode, unspecified: Secondary | ICD-10-CM | POA: Diagnosis not present

## 2021-12-03 DIAGNOSIS — R32 Unspecified urinary incontinence: Secondary | ICD-10-CM | POA: Diagnosis not present

## 2021-12-03 DIAGNOSIS — I10 Essential (primary) hypertension: Secondary | ICD-10-CM | POA: Diagnosis not present

## 2021-12-03 DIAGNOSIS — D68312 Antiphospholipid antibody with hemorrhagic disorder: Secondary | ICD-10-CM | POA: Diagnosis not present

## 2021-12-03 DIAGNOSIS — F331 Major depressive disorder, recurrent, moderate: Secondary | ICD-10-CM | POA: Diagnosis not present

## 2021-12-03 DIAGNOSIS — N202 Calculus of kidney with calculus of ureter: Secondary | ICD-10-CM | POA: Diagnosis not present

## 2021-12-03 DIAGNOSIS — F01B4 Vascular dementia, moderate, with anxiety: Secondary | ICD-10-CM | POA: Diagnosis not present

## 2021-12-03 DIAGNOSIS — N390 Urinary tract infection, site not specified: Secondary | ICD-10-CM | POA: Diagnosis not present

## 2021-12-03 DIAGNOSIS — N319 Neuromuscular dysfunction of bladder, unspecified: Secondary | ICD-10-CM | POA: Diagnosis not present

## 2021-12-03 DIAGNOSIS — N189 Chronic kidney disease, unspecified: Secondary | ICD-10-CM | POA: Diagnosis not present

## 2021-12-03 DIAGNOSIS — I69320 Aphasia following cerebral infarction: Secondary | ICD-10-CM | POA: Diagnosis not present

## 2021-12-03 DIAGNOSIS — R41841 Cognitive communication deficit: Secondary | ICD-10-CM | POA: Diagnosis not present

## 2021-12-03 DIAGNOSIS — G4700 Insomnia, unspecified: Secondary | ICD-10-CM | POA: Diagnosis not present

## 2021-12-03 DIAGNOSIS — N200 Calculus of kidney: Secondary | ICD-10-CM | POA: Diagnosis not present

## 2021-12-03 DIAGNOSIS — Z7901 Long term (current) use of anticoagulants: Secondary | ICD-10-CM | POA: Diagnosis not present

## 2021-12-03 DIAGNOSIS — F5104 Psychophysiologic insomnia: Secondary | ICD-10-CM | POA: Diagnosis not present

## 2021-12-03 DIAGNOSIS — N132 Hydronephrosis with renal and ureteral calculous obstruction: Secondary | ICD-10-CM | POA: Diagnosis not present

## 2021-12-03 DIAGNOSIS — R2689 Other abnormalities of gait and mobility: Secondary | ICD-10-CM | POA: Diagnosis not present

## 2021-12-03 DIAGNOSIS — M84471D Pathological fracture, right ankle, subsequent encounter for fracture with routine healing: Secondary | ICD-10-CM | POA: Diagnosis not present

## 2021-12-03 DIAGNOSIS — Z23 Encounter for immunization: Secondary | ICD-10-CM | POA: Diagnosis not present

## 2021-12-03 DIAGNOSIS — R1312 Dysphagia, oropharyngeal phase: Secondary | ICD-10-CM | POA: Diagnosis not present

## 2021-12-03 DIAGNOSIS — F419 Anxiety disorder, unspecified: Secondary | ICD-10-CM | POA: Diagnosis not present

## 2021-12-03 DIAGNOSIS — R451 Restlessness and agitation: Secondary | ICD-10-CM | POA: Diagnosis not present

## 2021-12-03 DIAGNOSIS — I69391 Dysphagia following cerebral infarction: Secondary | ICD-10-CM | POA: Diagnosis not present

## 2021-12-03 DIAGNOSIS — R296 Repeated falls: Secondary | ICD-10-CM | POA: Diagnosis not present

## 2021-12-03 DIAGNOSIS — I639 Cerebral infarction, unspecified: Secondary | ICD-10-CM | POA: Diagnosis not present

## 2021-12-03 DIAGNOSIS — I6350 Cerebral infarction due to unspecified occlusion or stenosis of unspecified cerebral artery: Secondary | ICD-10-CM | POA: Diagnosis not present

## 2021-12-03 DIAGNOSIS — E782 Mixed hyperlipidemia: Secondary | ICD-10-CM | POA: Diagnosis not present

## 2021-12-03 DIAGNOSIS — K219 Gastro-esophageal reflux disease without esophagitis: Secondary | ICD-10-CM | POA: Diagnosis not present

## 2021-12-03 DIAGNOSIS — Z8673 Personal history of transient ischemic attack (TIA), and cerebral infarction without residual deficits: Secondary | ICD-10-CM | POA: Diagnosis not present

## 2021-12-03 DIAGNOSIS — M80061A Age-related osteoporosis with current pathological fracture, right lower leg, initial encounter for fracture: Secondary | ICD-10-CM | POA: Diagnosis not present

## 2021-12-03 DIAGNOSIS — D6832 Hemorrhagic disorder due to extrinsic circulating anticoagulants: Secondary | ICD-10-CM | POA: Diagnosis not present

## 2021-12-03 DIAGNOSIS — E46 Unspecified protein-calorie malnutrition: Secondary | ICD-10-CM | POA: Diagnosis not present

## 2021-12-03 DIAGNOSIS — N179 Acute kidney failure, unspecified: Secondary | ICD-10-CM | POA: Diagnosis not present

## 2021-12-03 DIAGNOSIS — E039 Hypothyroidism, unspecified: Secondary | ICD-10-CM | POA: Diagnosis not present

## 2021-12-03 DIAGNOSIS — D6861 Antiphospholipid syndrome: Secondary | ICD-10-CM | POA: Diagnosis not present

## 2021-12-03 DIAGNOSIS — M6281 Muscle weakness (generalized): Secondary | ICD-10-CM | POA: Diagnosis not present

## 2021-12-03 DIAGNOSIS — I69351 Hemiplegia and hemiparesis following cerebral infarction affecting right dominant side: Secondary | ICD-10-CM | POA: Diagnosis not present

## 2021-12-03 DIAGNOSIS — R2681 Unsteadiness on feet: Secondary | ICD-10-CM | POA: Diagnosis not present

## 2021-12-04 DIAGNOSIS — E46 Unspecified protein-calorie malnutrition: Secondary | ICD-10-CM | POA: Diagnosis not present

## 2021-12-04 DIAGNOSIS — R1312 Dysphagia, oropharyngeal phase: Secondary | ICD-10-CM | POA: Diagnosis not present

## 2021-12-04 DIAGNOSIS — I82401 Acute embolism and thrombosis of unspecified deep veins of right lower extremity: Secondary | ICD-10-CM | POA: Diagnosis not present

## 2021-12-04 DIAGNOSIS — I10 Essential (primary) hypertension: Secondary | ICD-10-CM | POA: Diagnosis not present

## 2021-12-04 DIAGNOSIS — N189 Chronic kidney disease, unspecified: Secondary | ICD-10-CM | POA: Diagnosis not present

## 2021-12-05 DIAGNOSIS — E46 Unspecified protein-calorie malnutrition: Secondary | ICD-10-CM | POA: Diagnosis not present

## 2021-12-05 DIAGNOSIS — R2681 Unsteadiness on feet: Secondary | ICD-10-CM | POA: Diagnosis not present

## 2021-12-05 DIAGNOSIS — N179 Acute kidney failure, unspecified: Secondary | ICD-10-CM | POA: Diagnosis not present

## 2021-12-05 DIAGNOSIS — F329 Major depressive disorder, single episode, unspecified: Secondary | ICD-10-CM | POA: Diagnosis not present

## 2021-12-05 DIAGNOSIS — R278 Other lack of coordination: Secondary | ICD-10-CM | POA: Diagnosis not present

## 2021-12-05 DIAGNOSIS — F331 Major depressive disorder, recurrent, moderate: Secondary | ICD-10-CM | POA: Diagnosis not present

## 2021-12-05 DIAGNOSIS — I82401 Acute embolism and thrombosis of unspecified deep veins of right lower extremity: Secondary | ICD-10-CM | POA: Diagnosis not present

## 2021-12-05 DIAGNOSIS — F01B4 Vascular dementia, moderate, with anxiety: Secondary | ICD-10-CM | POA: Diagnosis not present

## 2021-12-05 DIAGNOSIS — I639 Cerebral infarction, unspecified: Secondary | ICD-10-CM | POA: Diagnosis not present

## 2021-12-05 DIAGNOSIS — M80061A Age-related osteoporosis with current pathological fracture, right lower leg, initial encounter for fracture: Secondary | ICD-10-CM | POA: Diagnosis not present

## 2021-12-05 DIAGNOSIS — G9341 Metabolic encephalopathy: Secondary | ICD-10-CM | POA: Diagnosis not present

## 2021-12-05 DIAGNOSIS — E039 Hypothyroidism, unspecified: Secondary | ICD-10-CM | POA: Diagnosis not present

## 2021-12-05 DIAGNOSIS — F5105 Insomnia due to other mental disorder: Secondary | ICD-10-CM | POA: Diagnosis not present

## 2021-12-05 DIAGNOSIS — E782 Mixed hyperlipidemia: Secondary | ICD-10-CM | POA: Diagnosis not present

## 2021-12-05 DIAGNOSIS — R1312 Dysphagia, oropharyngeal phase: Secondary | ICD-10-CM | POA: Diagnosis not present

## 2021-12-05 DIAGNOSIS — D68312 Antiphospholipid antibody with hemorrhagic disorder: Secondary | ICD-10-CM | POA: Diagnosis not present

## 2021-12-05 DIAGNOSIS — N189 Chronic kidney disease, unspecified: Secondary | ICD-10-CM | POA: Diagnosis not present

## 2021-12-05 DIAGNOSIS — I10 Essential (primary) hypertension: Secondary | ICD-10-CM | POA: Diagnosis not present

## 2021-12-05 DIAGNOSIS — M6281 Muscle weakness (generalized): Secondary | ICD-10-CM | POA: Diagnosis not present

## 2021-12-06 DIAGNOSIS — R1312 Dysphagia, oropharyngeal phase: Secondary | ICD-10-CM | POA: Diagnosis not present

## 2021-12-06 DIAGNOSIS — E46 Unspecified protein-calorie malnutrition: Secondary | ICD-10-CM | POA: Diagnosis not present

## 2021-12-06 DIAGNOSIS — I82401 Acute embolism and thrombosis of unspecified deep veins of right lower extremity: Secondary | ICD-10-CM | POA: Diagnosis not present

## 2021-12-06 DIAGNOSIS — G4700 Insomnia, unspecified: Secondary | ICD-10-CM | POA: Diagnosis not present

## 2021-12-07 DIAGNOSIS — I10 Essential (primary) hypertension: Secondary | ICD-10-CM | POA: Diagnosis not present

## 2021-12-07 DIAGNOSIS — I639 Cerebral infarction, unspecified: Secondary | ICD-10-CM | POA: Diagnosis not present

## 2021-12-07 DIAGNOSIS — M6281 Muscle weakness (generalized): Secondary | ICD-10-CM | POA: Diagnosis not present

## 2021-12-07 DIAGNOSIS — G9341 Metabolic encephalopathy: Secondary | ICD-10-CM | POA: Diagnosis not present

## 2021-12-07 DIAGNOSIS — E039 Hypothyroidism, unspecified: Secondary | ICD-10-CM | POA: Diagnosis not present

## 2021-12-07 DIAGNOSIS — F329 Major depressive disorder, single episode, unspecified: Secondary | ICD-10-CM | POA: Diagnosis not present

## 2021-12-07 DIAGNOSIS — R2681 Unsteadiness on feet: Secondary | ICD-10-CM | POA: Diagnosis not present

## 2021-12-07 DIAGNOSIS — M80061A Age-related osteoporosis with current pathological fracture, right lower leg, initial encounter for fracture: Secondary | ICD-10-CM | POA: Diagnosis not present

## 2021-12-07 DIAGNOSIS — N179 Acute kidney failure, unspecified: Secondary | ICD-10-CM | POA: Diagnosis not present

## 2021-12-07 DIAGNOSIS — R278 Other lack of coordination: Secondary | ICD-10-CM | POA: Diagnosis not present

## 2021-12-07 DIAGNOSIS — E782 Mixed hyperlipidemia: Secondary | ICD-10-CM | POA: Diagnosis not present

## 2021-12-07 DIAGNOSIS — D68312 Antiphospholipid antibody with hemorrhagic disorder: Secondary | ICD-10-CM | POA: Diagnosis not present

## 2021-12-08 DIAGNOSIS — R41841 Cognitive communication deficit: Secondary | ICD-10-CM | POA: Diagnosis not present

## 2021-12-08 DIAGNOSIS — I10 Essential (primary) hypertension: Secondary | ICD-10-CM | POA: Diagnosis not present

## 2021-12-08 DIAGNOSIS — R1312 Dysphagia, oropharyngeal phase: Secondary | ICD-10-CM | POA: Diagnosis not present

## 2021-12-08 DIAGNOSIS — I82401 Acute embolism and thrombosis of unspecified deep veins of right lower extremity: Secondary | ICD-10-CM | POA: Diagnosis not present

## 2021-12-08 DIAGNOSIS — M84471D Pathological fracture, right ankle, subsequent encounter for fracture with routine healing: Secondary | ICD-10-CM | POA: Diagnosis not present

## 2021-12-08 DIAGNOSIS — G9341 Metabolic encephalopathy: Secondary | ICD-10-CM | POA: Diagnosis not present

## 2021-12-08 DIAGNOSIS — I6350 Cerebral infarction due to unspecified occlusion or stenosis of unspecified cerebral artery: Secondary | ICD-10-CM | POA: Diagnosis not present

## 2021-12-08 DIAGNOSIS — N189 Chronic kidney disease, unspecified: Secondary | ICD-10-CM | POA: Diagnosis not present

## 2021-12-08 DIAGNOSIS — Z7901 Long term (current) use of anticoagulants: Secondary | ICD-10-CM | POA: Diagnosis not present

## 2021-12-08 DIAGNOSIS — G4700 Insomnia, unspecified: Secondary | ICD-10-CM | POA: Diagnosis not present

## 2021-12-08 DIAGNOSIS — E039 Hypothyroidism, unspecified: Secondary | ICD-10-CM | POA: Diagnosis not present

## 2021-12-08 DIAGNOSIS — E46 Unspecified protein-calorie malnutrition: Secondary | ICD-10-CM | POA: Diagnosis not present

## 2021-12-11 DIAGNOSIS — E039 Hypothyroidism, unspecified: Secondary | ICD-10-CM | POA: Diagnosis not present

## 2021-12-11 DIAGNOSIS — N179 Acute kidney failure, unspecified: Secondary | ICD-10-CM | POA: Diagnosis not present

## 2021-12-11 DIAGNOSIS — D68312 Antiphospholipid antibody with hemorrhagic disorder: Secondary | ICD-10-CM | POA: Diagnosis not present

## 2021-12-11 DIAGNOSIS — G9341 Metabolic encephalopathy: Secondary | ICD-10-CM | POA: Diagnosis not present

## 2021-12-11 DIAGNOSIS — I639 Cerebral infarction, unspecified: Secondary | ICD-10-CM | POA: Diagnosis not present

## 2021-12-11 DIAGNOSIS — I10 Essential (primary) hypertension: Secondary | ICD-10-CM | POA: Diagnosis not present

## 2021-12-11 DIAGNOSIS — R2681 Unsteadiness on feet: Secondary | ICD-10-CM | POA: Diagnosis not present

## 2021-12-11 DIAGNOSIS — E782 Mixed hyperlipidemia: Secondary | ICD-10-CM | POA: Diagnosis not present

## 2021-12-11 DIAGNOSIS — M6281 Muscle weakness (generalized): Secondary | ICD-10-CM | POA: Diagnosis not present

## 2021-12-11 DIAGNOSIS — M80061A Age-related osteoporosis with current pathological fracture, right lower leg, initial encounter for fracture: Secondary | ICD-10-CM | POA: Diagnosis not present

## 2021-12-11 DIAGNOSIS — R278 Other lack of coordination: Secondary | ICD-10-CM | POA: Diagnosis not present

## 2021-12-11 DIAGNOSIS — F329 Major depressive disorder, single episode, unspecified: Secondary | ICD-10-CM | POA: Diagnosis not present

## 2021-12-12 DIAGNOSIS — R1312 Dysphagia, oropharyngeal phase: Secondary | ICD-10-CM | POA: Diagnosis not present

## 2021-12-12 DIAGNOSIS — I82401 Acute embolism and thrombosis of unspecified deep veins of right lower extremity: Secondary | ICD-10-CM | POA: Diagnosis not present

## 2021-12-12 DIAGNOSIS — E46 Unspecified protein-calorie malnutrition: Secondary | ICD-10-CM | POA: Diagnosis not present

## 2021-12-12 DIAGNOSIS — G4700 Insomnia, unspecified: Secondary | ICD-10-CM | POA: Diagnosis not present

## 2021-12-12 DIAGNOSIS — I10 Essential (primary) hypertension: Secondary | ICD-10-CM | POA: Diagnosis not present

## 2021-12-13 DIAGNOSIS — R278 Other lack of coordination: Secondary | ICD-10-CM | POA: Diagnosis not present

## 2021-12-13 DIAGNOSIS — I10 Essential (primary) hypertension: Secondary | ICD-10-CM | POA: Diagnosis not present

## 2021-12-13 DIAGNOSIS — G9341 Metabolic encephalopathy: Secondary | ICD-10-CM | POA: Diagnosis not present

## 2021-12-13 DIAGNOSIS — M80061A Age-related osteoporosis with current pathological fracture, right lower leg, initial encounter for fracture: Secondary | ICD-10-CM | POA: Diagnosis not present

## 2021-12-13 DIAGNOSIS — E039 Hypothyroidism, unspecified: Secondary | ICD-10-CM | POA: Diagnosis not present

## 2021-12-13 DIAGNOSIS — I639 Cerebral infarction, unspecified: Secondary | ICD-10-CM | POA: Diagnosis not present

## 2021-12-13 DIAGNOSIS — D68312 Antiphospholipid antibody with hemorrhagic disorder: Secondary | ICD-10-CM | POA: Diagnosis not present

## 2021-12-13 DIAGNOSIS — M6281 Muscle weakness (generalized): Secondary | ICD-10-CM | POA: Diagnosis not present

## 2021-12-13 DIAGNOSIS — E782 Mixed hyperlipidemia: Secondary | ICD-10-CM | POA: Diagnosis not present

## 2021-12-13 DIAGNOSIS — R2681 Unsteadiness on feet: Secondary | ICD-10-CM | POA: Diagnosis not present

## 2021-12-13 DIAGNOSIS — F329 Major depressive disorder, single episode, unspecified: Secondary | ICD-10-CM | POA: Diagnosis not present

## 2021-12-13 DIAGNOSIS — N179 Acute kidney failure, unspecified: Secondary | ICD-10-CM | POA: Diagnosis not present

## 2021-12-14 DIAGNOSIS — R1312 Dysphagia, oropharyngeal phase: Secondary | ICD-10-CM | POA: Diagnosis not present

## 2021-12-14 DIAGNOSIS — E46 Unspecified protein-calorie malnutrition: Secondary | ICD-10-CM | POA: Diagnosis not present

## 2021-12-14 DIAGNOSIS — I82401 Acute embolism and thrombosis of unspecified deep veins of right lower extremity: Secondary | ICD-10-CM | POA: Diagnosis not present

## 2021-12-14 DIAGNOSIS — R296 Repeated falls: Secondary | ICD-10-CM | POA: Diagnosis not present

## 2021-12-14 DIAGNOSIS — G4700 Insomnia, unspecified: Secondary | ICD-10-CM | POA: Diagnosis not present

## 2021-12-15 DIAGNOSIS — R296 Repeated falls: Secondary | ICD-10-CM | POA: Diagnosis not present

## 2021-12-15 DIAGNOSIS — M6281 Muscle weakness (generalized): Secondary | ICD-10-CM | POA: Diagnosis not present

## 2021-12-15 DIAGNOSIS — F329 Major depressive disorder, single episode, unspecified: Secondary | ICD-10-CM | POA: Diagnosis not present

## 2021-12-15 DIAGNOSIS — G4700 Insomnia, unspecified: Secondary | ICD-10-CM | POA: Diagnosis not present

## 2021-12-15 DIAGNOSIS — E039 Hypothyroidism, unspecified: Secondary | ICD-10-CM | POA: Diagnosis not present

## 2021-12-15 DIAGNOSIS — I82401 Acute embolism and thrombosis of unspecified deep veins of right lower extremity: Secondary | ICD-10-CM | POA: Diagnosis not present

## 2021-12-15 DIAGNOSIS — R278 Other lack of coordination: Secondary | ICD-10-CM | POA: Diagnosis not present

## 2021-12-15 DIAGNOSIS — R2681 Unsteadiness on feet: Secondary | ICD-10-CM | POA: Diagnosis not present

## 2021-12-15 DIAGNOSIS — I10 Essential (primary) hypertension: Secondary | ICD-10-CM | POA: Diagnosis not present

## 2021-12-15 DIAGNOSIS — I639 Cerebral infarction, unspecified: Secondary | ICD-10-CM | POA: Diagnosis not present

## 2021-12-15 DIAGNOSIS — N179 Acute kidney failure, unspecified: Secondary | ICD-10-CM | POA: Diagnosis not present

## 2021-12-15 DIAGNOSIS — G9341 Metabolic encephalopathy: Secondary | ICD-10-CM | POA: Diagnosis not present

## 2021-12-15 DIAGNOSIS — M80061A Age-related osteoporosis with current pathological fracture, right lower leg, initial encounter for fracture: Secondary | ICD-10-CM | POA: Diagnosis not present

## 2021-12-15 DIAGNOSIS — E46 Unspecified protein-calorie malnutrition: Secondary | ICD-10-CM | POA: Diagnosis not present

## 2021-12-15 DIAGNOSIS — E782 Mixed hyperlipidemia: Secondary | ICD-10-CM | POA: Diagnosis not present

## 2021-12-15 DIAGNOSIS — R1312 Dysphagia, oropharyngeal phase: Secondary | ICD-10-CM | POA: Diagnosis not present

## 2021-12-15 DIAGNOSIS — D68312 Antiphospholipid antibody with hemorrhagic disorder: Secondary | ICD-10-CM | POA: Diagnosis not present

## 2021-12-18 DIAGNOSIS — D68312 Antiphospholipid antibody with hemorrhagic disorder: Secondary | ICD-10-CM | POA: Diagnosis not present

## 2021-12-18 DIAGNOSIS — R278 Other lack of coordination: Secondary | ICD-10-CM | POA: Diagnosis not present

## 2021-12-18 DIAGNOSIS — M80061A Age-related osteoporosis with current pathological fracture, right lower leg, initial encounter for fracture: Secondary | ICD-10-CM | POA: Diagnosis not present

## 2021-12-18 DIAGNOSIS — E782 Mixed hyperlipidemia: Secondary | ICD-10-CM | POA: Diagnosis not present

## 2021-12-18 DIAGNOSIS — N179 Acute kidney failure, unspecified: Secondary | ICD-10-CM | POA: Diagnosis not present

## 2021-12-18 DIAGNOSIS — G9341 Metabolic encephalopathy: Secondary | ICD-10-CM | POA: Diagnosis not present

## 2021-12-18 DIAGNOSIS — E039 Hypothyroidism, unspecified: Secondary | ICD-10-CM | POA: Diagnosis not present

## 2021-12-18 DIAGNOSIS — F329 Major depressive disorder, single episode, unspecified: Secondary | ICD-10-CM | POA: Diagnosis not present

## 2021-12-18 DIAGNOSIS — R2681 Unsteadiness on feet: Secondary | ICD-10-CM | POA: Diagnosis not present

## 2021-12-18 DIAGNOSIS — I639 Cerebral infarction, unspecified: Secondary | ICD-10-CM | POA: Diagnosis not present

## 2021-12-18 DIAGNOSIS — I10 Essential (primary) hypertension: Secondary | ICD-10-CM | POA: Diagnosis not present

## 2021-12-18 DIAGNOSIS — M6281 Muscle weakness (generalized): Secondary | ICD-10-CM | POA: Diagnosis not present

## 2021-12-19 DIAGNOSIS — G4700 Insomnia, unspecified: Secondary | ICD-10-CM | POA: Diagnosis not present

## 2021-12-19 DIAGNOSIS — R1312 Dysphagia, oropharyngeal phase: Secondary | ICD-10-CM | POA: Diagnosis not present

## 2021-12-19 DIAGNOSIS — E46 Unspecified protein-calorie malnutrition: Secondary | ICD-10-CM | POA: Diagnosis not present

## 2021-12-19 DIAGNOSIS — I82401 Acute embolism and thrombosis of unspecified deep veins of right lower extremity: Secondary | ICD-10-CM | POA: Diagnosis not present

## 2021-12-20 DIAGNOSIS — F329 Major depressive disorder, single episode, unspecified: Secondary | ICD-10-CM | POA: Diagnosis not present

## 2021-12-20 DIAGNOSIS — I10 Essential (primary) hypertension: Secondary | ICD-10-CM | POA: Diagnosis not present

## 2021-12-20 DIAGNOSIS — R2681 Unsteadiness on feet: Secondary | ICD-10-CM | POA: Diagnosis not present

## 2021-12-20 DIAGNOSIS — I639 Cerebral infarction, unspecified: Secondary | ICD-10-CM | POA: Diagnosis not present

## 2021-12-20 DIAGNOSIS — E782 Mixed hyperlipidemia: Secondary | ICD-10-CM | POA: Diagnosis not present

## 2021-12-20 DIAGNOSIS — M80061A Age-related osteoporosis with current pathological fracture, right lower leg, initial encounter for fracture: Secondary | ICD-10-CM | POA: Diagnosis not present

## 2021-12-20 DIAGNOSIS — G9341 Metabolic encephalopathy: Secondary | ICD-10-CM | POA: Diagnosis not present

## 2021-12-20 DIAGNOSIS — D68312 Antiphospholipid antibody with hemorrhagic disorder: Secondary | ICD-10-CM | POA: Diagnosis not present

## 2021-12-20 DIAGNOSIS — M6281 Muscle weakness (generalized): Secondary | ICD-10-CM | POA: Diagnosis not present

## 2021-12-20 DIAGNOSIS — R278 Other lack of coordination: Secondary | ICD-10-CM | POA: Diagnosis not present

## 2021-12-20 DIAGNOSIS — N179 Acute kidney failure, unspecified: Secondary | ICD-10-CM | POA: Diagnosis not present

## 2021-12-20 DIAGNOSIS — E039 Hypothyroidism, unspecified: Secondary | ICD-10-CM | POA: Diagnosis not present

## 2021-12-22 DIAGNOSIS — M6281 Muscle weakness (generalized): Secondary | ICD-10-CM | POA: Diagnosis not present

## 2021-12-22 DIAGNOSIS — G9341 Metabolic encephalopathy: Secondary | ICD-10-CM | POA: Diagnosis not present

## 2021-12-22 DIAGNOSIS — E782 Mixed hyperlipidemia: Secondary | ICD-10-CM | POA: Diagnosis not present

## 2021-12-22 DIAGNOSIS — E46 Unspecified protein-calorie malnutrition: Secondary | ICD-10-CM | POA: Diagnosis not present

## 2021-12-22 DIAGNOSIS — E039 Hypothyroidism, unspecified: Secondary | ICD-10-CM | POA: Diagnosis not present

## 2021-12-22 DIAGNOSIS — G4700 Insomnia, unspecified: Secondary | ICD-10-CM | POA: Diagnosis not present

## 2021-12-22 DIAGNOSIS — I10 Essential (primary) hypertension: Secondary | ICD-10-CM | POA: Diagnosis not present

## 2021-12-22 DIAGNOSIS — M80061A Age-related osteoporosis with current pathological fracture, right lower leg, initial encounter for fracture: Secondary | ICD-10-CM | POA: Diagnosis not present

## 2021-12-22 DIAGNOSIS — R2681 Unsteadiness on feet: Secondary | ICD-10-CM | POA: Diagnosis not present

## 2021-12-22 DIAGNOSIS — I82401 Acute embolism and thrombosis of unspecified deep veins of right lower extremity: Secondary | ICD-10-CM | POA: Diagnosis not present

## 2021-12-22 DIAGNOSIS — I639 Cerebral infarction, unspecified: Secondary | ICD-10-CM | POA: Diagnosis not present

## 2021-12-22 DIAGNOSIS — R278 Other lack of coordination: Secondary | ICD-10-CM | POA: Diagnosis not present

## 2021-12-22 DIAGNOSIS — D68312 Antiphospholipid antibody with hemorrhagic disorder: Secondary | ICD-10-CM | POA: Diagnosis not present

## 2021-12-22 DIAGNOSIS — R1312 Dysphagia, oropharyngeal phase: Secondary | ICD-10-CM | POA: Diagnosis not present

## 2021-12-22 DIAGNOSIS — F329 Major depressive disorder, single episode, unspecified: Secondary | ICD-10-CM | POA: Diagnosis not present

## 2021-12-22 DIAGNOSIS — N179 Acute kidney failure, unspecified: Secondary | ICD-10-CM | POA: Diagnosis not present

## 2021-12-25 DIAGNOSIS — E039 Hypothyroidism, unspecified: Secondary | ICD-10-CM | POA: Diagnosis not present

## 2021-12-25 DIAGNOSIS — G9341 Metabolic encephalopathy: Secondary | ICD-10-CM | POA: Diagnosis not present

## 2021-12-25 DIAGNOSIS — I639 Cerebral infarction, unspecified: Secondary | ICD-10-CM | POA: Diagnosis not present

## 2021-12-25 DIAGNOSIS — I10 Essential (primary) hypertension: Secondary | ICD-10-CM | POA: Diagnosis not present

## 2021-12-25 DIAGNOSIS — M80061A Age-related osteoporosis with current pathological fracture, right lower leg, initial encounter for fracture: Secondary | ICD-10-CM | POA: Diagnosis not present

## 2021-12-25 DIAGNOSIS — E782 Mixed hyperlipidemia: Secondary | ICD-10-CM | POA: Diagnosis not present

## 2021-12-25 DIAGNOSIS — F329 Major depressive disorder, single episode, unspecified: Secondary | ICD-10-CM | POA: Diagnosis not present

## 2021-12-25 DIAGNOSIS — M6281 Muscle weakness (generalized): Secondary | ICD-10-CM | POA: Diagnosis not present

## 2021-12-25 DIAGNOSIS — R2681 Unsteadiness on feet: Secondary | ICD-10-CM | POA: Diagnosis not present

## 2021-12-25 DIAGNOSIS — R278 Other lack of coordination: Secondary | ICD-10-CM | POA: Diagnosis not present

## 2021-12-25 DIAGNOSIS — D68312 Antiphospholipid antibody with hemorrhagic disorder: Secondary | ICD-10-CM | POA: Diagnosis not present

## 2021-12-25 DIAGNOSIS — N179 Acute kidney failure, unspecified: Secondary | ICD-10-CM | POA: Diagnosis not present

## 2021-12-26 DIAGNOSIS — E46 Unspecified protein-calorie malnutrition: Secondary | ICD-10-CM | POA: Diagnosis not present

## 2021-12-26 DIAGNOSIS — G4700 Insomnia, unspecified: Secondary | ICD-10-CM | POA: Diagnosis not present

## 2021-12-26 DIAGNOSIS — R1312 Dysphagia, oropharyngeal phase: Secondary | ICD-10-CM | POA: Diagnosis not present

## 2021-12-26 DIAGNOSIS — I82401 Acute embolism and thrombosis of unspecified deep veins of right lower extremity: Secondary | ICD-10-CM | POA: Diagnosis not present

## 2021-12-27 DIAGNOSIS — M6281 Muscle weakness (generalized): Secondary | ICD-10-CM | POA: Diagnosis not present

## 2021-12-27 DIAGNOSIS — R278 Other lack of coordination: Secondary | ICD-10-CM | POA: Diagnosis not present

## 2021-12-27 DIAGNOSIS — I639 Cerebral infarction, unspecified: Secondary | ICD-10-CM | POA: Diagnosis not present

## 2021-12-27 DIAGNOSIS — E782 Mixed hyperlipidemia: Secondary | ICD-10-CM | POA: Diagnosis not present

## 2021-12-27 DIAGNOSIS — M80061A Age-related osteoporosis with current pathological fracture, right lower leg, initial encounter for fracture: Secondary | ICD-10-CM | POA: Diagnosis not present

## 2021-12-27 DIAGNOSIS — I10 Essential (primary) hypertension: Secondary | ICD-10-CM | POA: Diagnosis not present

## 2021-12-27 DIAGNOSIS — G9341 Metabolic encephalopathy: Secondary | ICD-10-CM | POA: Diagnosis not present

## 2021-12-27 DIAGNOSIS — N179 Acute kidney failure, unspecified: Secondary | ICD-10-CM | POA: Diagnosis not present

## 2021-12-27 DIAGNOSIS — E039 Hypothyroidism, unspecified: Secondary | ICD-10-CM | POA: Diagnosis not present

## 2021-12-27 DIAGNOSIS — D68312 Antiphospholipid antibody with hemorrhagic disorder: Secondary | ICD-10-CM | POA: Diagnosis not present

## 2021-12-27 DIAGNOSIS — R2681 Unsteadiness on feet: Secondary | ICD-10-CM | POA: Diagnosis not present

## 2021-12-27 DIAGNOSIS — F329 Major depressive disorder, single episode, unspecified: Secondary | ICD-10-CM | POA: Diagnosis not present

## 2021-12-29 DIAGNOSIS — E782 Mixed hyperlipidemia: Secondary | ICD-10-CM | POA: Diagnosis not present

## 2021-12-29 DIAGNOSIS — E039 Hypothyroidism, unspecified: Secondary | ICD-10-CM | POA: Diagnosis not present

## 2021-12-29 DIAGNOSIS — I10 Essential (primary) hypertension: Secondary | ICD-10-CM | POA: Diagnosis not present

## 2021-12-29 DIAGNOSIS — E46 Unspecified protein-calorie malnutrition: Secondary | ICD-10-CM | POA: Diagnosis not present

## 2021-12-29 DIAGNOSIS — I639 Cerebral infarction, unspecified: Secondary | ICD-10-CM | POA: Diagnosis not present

## 2021-12-29 DIAGNOSIS — R278 Other lack of coordination: Secondary | ICD-10-CM | POA: Diagnosis not present

## 2021-12-29 DIAGNOSIS — R1312 Dysphagia, oropharyngeal phase: Secondary | ICD-10-CM | POA: Diagnosis not present

## 2021-12-29 DIAGNOSIS — M6281 Muscle weakness (generalized): Secondary | ICD-10-CM | POA: Diagnosis not present

## 2021-12-29 DIAGNOSIS — N179 Acute kidney failure, unspecified: Secondary | ICD-10-CM | POA: Diagnosis not present

## 2021-12-29 DIAGNOSIS — I82401 Acute embolism and thrombosis of unspecified deep veins of right lower extremity: Secondary | ICD-10-CM | POA: Diagnosis not present

## 2021-12-29 DIAGNOSIS — N390 Urinary tract infection, site not specified: Secondary | ICD-10-CM | POA: Diagnosis not present

## 2021-12-29 DIAGNOSIS — F329 Major depressive disorder, single episode, unspecified: Secondary | ICD-10-CM | POA: Diagnosis not present

## 2021-12-29 DIAGNOSIS — G9341 Metabolic encephalopathy: Secondary | ICD-10-CM | POA: Diagnosis not present

## 2021-12-29 DIAGNOSIS — N200 Calculus of kidney: Secondary | ICD-10-CM | POA: Diagnosis not present

## 2021-12-29 DIAGNOSIS — M80061A Age-related osteoporosis with current pathological fracture, right lower leg, initial encounter for fracture: Secondary | ICD-10-CM | POA: Diagnosis not present

## 2021-12-29 DIAGNOSIS — G4700 Insomnia, unspecified: Secondary | ICD-10-CM | POA: Diagnosis not present

## 2021-12-29 DIAGNOSIS — R296 Repeated falls: Secondary | ICD-10-CM | POA: Diagnosis not present

## 2021-12-29 DIAGNOSIS — D68312 Antiphospholipid antibody with hemorrhagic disorder: Secondary | ICD-10-CM | POA: Diagnosis not present

## 2021-12-29 DIAGNOSIS — R2681 Unsteadiness on feet: Secondary | ICD-10-CM | POA: Diagnosis not present

## 2022-01-01 DIAGNOSIS — R296 Repeated falls: Secondary | ICD-10-CM | POA: Diagnosis not present

## 2022-01-01 DIAGNOSIS — R1312 Dysphagia, oropharyngeal phase: Secondary | ICD-10-CM | POA: Diagnosis not present

## 2022-01-01 DIAGNOSIS — G4700 Insomnia, unspecified: Secondary | ICD-10-CM | POA: Diagnosis not present

## 2022-01-01 DIAGNOSIS — E46 Unspecified protein-calorie malnutrition: Secondary | ICD-10-CM | POA: Diagnosis not present

## 2022-01-01 DIAGNOSIS — I82401 Acute embolism and thrombosis of unspecified deep veins of right lower extremity: Secondary | ICD-10-CM | POA: Diagnosis not present

## 2022-01-02 DIAGNOSIS — I82401 Acute embolism and thrombosis of unspecified deep veins of right lower extremity: Secondary | ICD-10-CM | POA: Diagnosis not present

## 2022-01-02 DIAGNOSIS — G9341 Metabolic encephalopathy: Secondary | ICD-10-CM | POA: Diagnosis not present

## 2022-01-02 DIAGNOSIS — R278 Other lack of coordination: Secondary | ICD-10-CM | POA: Diagnosis not present

## 2022-01-02 DIAGNOSIS — R1312 Dysphagia, oropharyngeal phase: Secondary | ICD-10-CM | POA: Diagnosis not present

## 2022-01-02 DIAGNOSIS — D68312 Antiphospholipid antibody with hemorrhagic disorder: Secondary | ICD-10-CM | POA: Diagnosis not present

## 2022-01-02 DIAGNOSIS — E46 Unspecified protein-calorie malnutrition: Secondary | ICD-10-CM | POA: Diagnosis not present

## 2022-01-02 DIAGNOSIS — E782 Mixed hyperlipidemia: Secondary | ICD-10-CM | POA: Diagnosis not present

## 2022-01-02 DIAGNOSIS — I639 Cerebral infarction, unspecified: Secondary | ICD-10-CM | POA: Diagnosis not present

## 2022-01-02 DIAGNOSIS — F329 Major depressive disorder, single episode, unspecified: Secondary | ICD-10-CM | POA: Diagnosis not present

## 2022-01-02 DIAGNOSIS — M80061A Age-related osteoporosis with current pathological fracture, right lower leg, initial encounter for fracture: Secondary | ICD-10-CM | POA: Diagnosis not present

## 2022-01-02 DIAGNOSIS — R41841 Cognitive communication deficit: Secondary | ICD-10-CM | POA: Diagnosis not present

## 2022-01-02 DIAGNOSIS — I10 Essential (primary) hypertension: Secondary | ICD-10-CM | POA: Diagnosis not present

## 2022-01-02 DIAGNOSIS — M6281 Muscle weakness (generalized): Secondary | ICD-10-CM | POA: Diagnosis not present

## 2022-01-02 DIAGNOSIS — E039 Hypothyroidism, unspecified: Secondary | ICD-10-CM | POA: Diagnosis not present

## 2022-01-02 DIAGNOSIS — R2681 Unsteadiness on feet: Secondary | ICD-10-CM | POA: Diagnosis not present

## 2022-01-02 DIAGNOSIS — N179 Acute kidney failure, unspecified: Secondary | ICD-10-CM | POA: Diagnosis not present

## 2022-01-02 DIAGNOSIS — G4700 Insomnia, unspecified: Secondary | ICD-10-CM | POA: Diagnosis not present

## 2022-01-03 DIAGNOSIS — R41841 Cognitive communication deficit: Secondary | ICD-10-CM | POA: Diagnosis not present

## 2022-01-03 DIAGNOSIS — R1312 Dysphagia, oropharyngeal phase: Secondary | ICD-10-CM | POA: Diagnosis not present

## 2022-01-03 DIAGNOSIS — I82401 Acute embolism and thrombosis of unspecified deep veins of right lower extremity: Secondary | ICD-10-CM | POA: Diagnosis not present

## 2022-01-03 DIAGNOSIS — N132 Hydronephrosis with renal and ureteral calculous obstruction: Secondary | ICD-10-CM | POA: Diagnosis not present

## 2022-01-03 DIAGNOSIS — E46 Unspecified protein-calorie malnutrition: Secondary | ICD-10-CM | POA: Diagnosis not present

## 2022-01-04 DIAGNOSIS — I10 Essential (primary) hypertension: Secondary | ICD-10-CM | POA: Diagnosis not present

## 2022-01-04 DIAGNOSIS — N132 Hydronephrosis with renal and ureteral calculous obstruction: Secondary | ICD-10-CM | POA: Diagnosis not present

## 2022-01-04 DIAGNOSIS — E039 Hypothyroidism, unspecified: Secondary | ICD-10-CM | POA: Diagnosis not present

## 2022-01-04 DIAGNOSIS — M6281 Muscle weakness (generalized): Secondary | ICD-10-CM | POA: Diagnosis not present

## 2022-01-04 DIAGNOSIS — I82401 Acute embolism and thrombosis of unspecified deep veins of right lower extremity: Secondary | ICD-10-CM | POA: Diagnosis not present

## 2022-01-04 DIAGNOSIS — N390 Urinary tract infection, site not specified: Secondary | ICD-10-CM | POA: Diagnosis not present

## 2022-01-04 DIAGNOSIS — F331 Major depressive disorder, recurrent, moderate: Secondary | ICD-10-CM | POA: Diagnosis not present

## 2022-01-04 DIAGNOSIS — N179 Acute kidney failure, unspecified: Secondary | ICD-10-CM | POA: Diagnosis not present

## 2022-01-04 DIAGNOSIS — D68312 Antiphospholipid antibody with hemorrhagic disorder: Secondary | ICD-10-CM | POA: Diagnosis not present

## 2022-01-04 DIAGNOSIS — F329 Major depressive disorder, single episode, unspecified: Secondary | ICD-10-CM | POA: Diagnosis not present

## 2022-01-04 DIAGNOSIS — I639 Cerebral infarction, unspecified: Secondary | ICD-10-CM | POA: Diagnosis not present

## 2022-01-04 DIAGNOSIS — G9341 Metabolic encephalopathy: Secondary | ICD-10-CM | POA: Diagnosis not present

## 2022-01-04 DIAGNOSIS — R278 Other lack of coordination: Secondary | ICD-10-CM | POA: Diagnosis not present

## 2022-01-04 DIAGNOSIS — R2681 Unsteadiness on feet: Secondary | ICD-10-CM | POA: Diagnosis not present

## 2022-01-04 DIAGNOSIS — E46 Unspecified protein-calorie malnutrition: Secondary | ICD-10-CM | POA: Diagnosis not present

## 2022-01-04 DIAGNOSIS — F5105 Insomnia due to other mental disorder: Secondary | ICD-10-CM | POA: Diagnosis not present

## 2022-01-04 DIAGNOSIS — E782 Mixed hyperlipidemia: Secondary | ICD-10-CM | POA: Diagnosis not present

## 2022-01-04 DIAGNOSIS — F01B4 Vascular dementia, moderate, with anxiety: Secondary | ICD-10-CM | POA: Diagnosis not present

## 2022-01-04 DIAGNOSIS — M80061A Age-related osteoporosis with current pathological fracture, right lower leg, initial encounter for fracture: Secondary | ICD-10-CM | POA: Diagnosis not present

## 2022-01-04 DIAGNOSIS — R1312 Dysphagia, oropharyngeal phase: Secondary | ICD-10-CM | POA: Diagnosis not present

## 2022-01-05 DIAGNOSIS — E46 Unspecified protein-calorie malnutrition: Secondary | ICD-10-CM | POA: Diagnosis not present

## 2022-01-05 DIAGNOSIS — R1312 Dysphagia, oropharyngeal phase: Secondary | ICD-10-CM | POA: Diagnosis not present

## 2022-01-05 DIAGNOSIS — N390 Urinary tract infection, site not specified: Secondary | ICD-10-CM | POA: Diagnosis not present

## 2022-01-05 DIAGNOSIS — I82401 Acute embolism and thrombosis of unspecified deep veins of right lower extremity: Secondary | ICD-10-CM | POA: Diagnosis not present

## 2022-01-08 DIAGNOSIS — D68312 Antiphospholipid antibody with hemorrhagic disorder: Secondary | ICD-10-CM | POA: Diagnosis not present

## 2022-01-08 DIAGNOSIS — I10 Essential (primary) hypertension: Secondary | ICD-10-CM | POA: Diagnosis not present

## 2022-01-08 DIAGNOSIS — E039 Hypothyroidism, unspecified: Secondary | ICD-10-CM | POA: Diagnosis not present

## 2022-01-08 DIAGNOSIS — G9341 Metabolic encephalopathy: Secondary | ICD-10-CM | POA: Diagnosis not present

## 2022-01-08 DIAGNOSIS — R2681 Unsteadiness on feet: Secondary | ICD-10-CM | POA: Diagnosis not present

## 2022-01-08 DIAGNOSIS — I639 Cerebral infarction, unspecified: Secondary | ICD-10-CM | POA: Diagnosis not present

## 2022-01-08 DIAGNOSIS — E782 Mixed hyperlipidemia: Secondary | ICD-10-CM | POA: Diagnosis not present

## 2022-01-08 DIAGNOSIS — F329 Major depressive disorder, single episode, unspecified: Secondary | ICD-10-CM | POA: Diagnosis not present

## 2022-01-08 DIAGNOSIS — M6281 Muscle weakness (generalized): Secondary | ICD-10-CM | POA: Diagnosis not present

## 2022-01-08 DIAGNOSIS — N179 Acute kidney failure, unspecified: Secondary | ICD-10-CM | POA: Diagnosis not present

## 2022-01-08 DIAGNOSIS — R278 Other lack of coordination: Secondary | ICD-10-CM | POA: Diagnosis not present

## 2022-01-08 DIAGNOSIS — M80061A Age-related osteoporosis with current pathological fracture, right lower leg, initial encounter for fracture: Secondary | ICD-10-CM | POA: Diagnosis not present

## 2022-01-09 DIAGNOSIS — E46 Unspecified protein-calorie malnutrition: Secondary | ICD-10-CM | POA: Diagnosis not present

## 2022-01-09 DIAGNOSIS — G4700 Insomnia, unspecified: Secondary | ICD-10-CM | POA: Diagnosis not present

## 2022-01-09 DIAGNOSIS — R41841 Cognitive communication deficit: Secondary | ICD-10-CM | POA: Diagnosis not present

## 2022-01-10 DIAGNOSIS — G9341 Metabolic encephalopathy: Secondary | ICD-10-CM | POA: Diagnosis not present

## 2022-01-10 DIAGNOSIS — M80061A Age-related osteoporosis with current pathological fracture, right lower leg, initial encounter for fracture: Secondary | ICD-10-CM | POA: Diagnosis not present

## 2022-01-10 DIAGNOSIS — M6281 Muscle weakness (generalized): Secondary | ICD-10-CM | POA: Diagnosis not present

## 2022-01-10 DIAGNOSIS — N179 Acute kidney failure, unspecified: Secondary | ICD-10-CM | POA: Diagnosis not present

## 2022-01-10 DIAGNOSIS — E782 Mixed hyperlipidemia: Secondary | ICD-10-CM | POA: Diagnosis not present

## 2022-01-10 DIAGNOSIS — I639 Cerebral infarction, unspecified: Secondary | ICD-10-CM | POA: Diagnosis not present

## 2022-01-10 DIAGNOSIS — I10 Essential (primary) hypertension: Secondary | ICD-10-CM | POA: Diagnosis not present

## 2022-01-10 DIAGNOSIS — F329 Major depressive disorder, single episode, unspecified: Secondary | ICD-10-CM | POA: Diagnosis not present

## 2022-01-10 DIAGNOSIS — N390 Urinary tract infection, site not specified: Secondary | ICD-10-CM | POA: Diagnosis not present

## 2022-01-10 DIAGNOSIS — D68312 Antiphospholipid antibody with hemorrhagic disorder: Secondary | ICD-10-CM | POA: Diagnosis not present

## 2022-01-10 DIAGNOSIS — R278 Other lack of coordination: Secondary | ICD-10-CM | POA: Diagnosis not present

## 2022-01-10 DIAGNOSIS — E039 Hypothyroidism, unspecified: Secondary | ICD-10-CM | POA: Diagnosis not present

## 2022-01-10 DIAGNOSIS — I82401 Acute embolism and thrombosis of unspecified deep veins of right lower extremity: Secondary | ICD-10-CM | POA: Diagnosis not present

## 2022-01-10 DIAGNOSIS — E46 Unspecified protein-calorie malnutrition: Secondary | ICD-10-CM | POA: Diagnosis not present

## 2022-01-10 DIAGNOSIS — R1312 Dysphagia, oropharyngeal phase: Secondary | ICD-10-CM | POA: Diagnosis not present

## 2022-01-10 DIAGNOSIS — R2681 Unsteadiness on feet: Secondary | ICD-10-CM | POA: Diagnosis not present

## 2022-01-12 DIAGNOSIS — N390 Urinary tract infection, site not specified: Secondary | ICD-10-CM | POA: Diagnosis not present

## 2022-01-12 DIAGNOSIS — I82401 Acute embolism and thrombosis of unspecified deep veins of right lower extremity: Secondary | ICD-10-CM | POA: Diagnosis not present

## 2022-01-12 DIAGNOSIS — R1312 Dysphagia, oropharyngeal phase: Secondary | ICD-10-CM | POA: Diagnosis not present

## 2022-01-12 DIAGNOSIS — E46 Unspecified protein-calorie malnutrition: Secondary | ICD-10-CM | POA: Diagnosis not present

## 2022-01-15 DIAGNOSIS — R278 Other lack of coordination: Secondary | ICD-10-CM | POA: Diagnosis not present

## 2022-01-15 DIAGNOSIS — F331 Major depressive disorder, recurrent, moderate: Secondary | ICD-10-CM | POA: Diagnosis not present

## 2022-01-15 DIAGNOSIS — E039 Hypothyroidism, unspecified: Secondary | ICD-10-CM | POA: Diagnosis not present

## 2022-01-15 DIAGNOSIS — E782 Mixed hyperlipidemia: Secondary | ICD-10-CM | POA: Diagnosis not present

## 2022-01-15 DIAGNOSIS — F329 Major depressive disorder, single episode, unspecified: Secondary | ICD-10-CM | POA: Diagnosis not present

## 2022-01-15 DIAGNOSIS — D68312 Antiphospholipid antibody with hemorrhagic disorder: Secondary | ICD-10-CM | POA: Diagnosis not present

## 2022-01-15 DIAGNOSIS — I639 Cerebral infarction, unspecified: Secondary | ICD-10-CM | POA: Diagnosis not present

## 2022-01-15 DIAGNOSIS — N179 Acute kidney failure, unspecified: Secondary | ICD-10-CM | POA: Diagnosis not present

## 2022-01-15 DIAGNOSIS — I10 Essential (primary) hypertension: Secondary | ICD-10-CM | POA: Diagnosis not present

## 2022-01-15 DIAGNOSIS — M6281 Muscle weakness (generalized): Secondary | ICD-10-CM | POA: Diagnosis not present

## 2022-01-15 DIAGNOSIS — G9341 Metabolic encephalopathy: Secondary | ICD-10-CM | POA: Diagnosis not present

## 2022-01-15 DIAGNOSIS — M80061A Age-related osteoporosis with current pathological fracture, right lower leg, initial encounter for fracture: Secondary | ICD-10-CM | POA: Diagnosis not present

## 2022-01-15 DIAGNOSIS — R2681 Unsteadiness on feet: Secondary | ICD-10-CM | POA: Diagnosis not present

## 2022-01-15 DIAGNOSIS — F419 Anxiety disorder, unspecified: Secondary | ICD-10-CM | POA: Diagnosis not present

## 2022-01-16 DIAGNOSIS — N200 Calculus of kidney: Secondary | ICD-10-CM | POA: Diagnosis not present

## 2022-01-16 DIAGNOSIS — I82401 Acute embolism and thrombosis of unspecified deep veins of right lower extremity: Secondary | ICD-10-CM | POA: Diagnosis not present

## 2022-01-16 DIAGNOSIS — E46 Unspecified protein-calorie malnutrition: Secondary | ICD-10-CM | POA: Diagnosis not present

## 2022-01-16 DIAGNOSIS — N390 Urinary tract infection, site not specified: Secondary | ICD-10-CM | POA: Diagnosis not present

## 2022-01-17 DIAGNOSIS — R32 Unspecified urinary incontinence: Secondary | ICD-10-CM | POA: Diagnosis not present

## 2022-01-17 DIAGNOSIS — N202 Calculus of kidney with calculus of ureter: Secondary | ICD-10-CM | POA: Diagnosis not present

## 2022-01-17 DIAGNOSIS — N319 Neuromuscular dysfunction of bladder, unspecified: Secondary | ICD-10-CM | POA: Diagnosis not present

## 2022-01-17 DIAGNOSIS — Z7901 Long term (current) use of anticoagulants: Secondary | ICD-10-CM | POA: Diagnosis not present

## 2022-01-17 DIAGNOSIS — I639 Cerebral infarction, unspecified: Secondary | ICD-10-CM | POA: Diagnosis not present

## 2022-01-17 DIAGNOSIS — Z8673 Personal history of transient ischemic attack (TIA), and cerebral infarction without residual deficits: Secondary | ICD-10-CM | POA: Diagnosis not present

## 2022-01-17 DIAGNOSIS — N200 Calculus of kidney: Secondary | ICD-10-CM | POA: Diagnosis not present

## 2022-01-19 DIAGNOSIS — M6281 Muscle weakness (generalized): Secondary | ICD-10-CM | POA: Diagnosis not present

## 2022-01-19 DIAGNOSIS — M80061A Age-related osteoporosis with current pathological fracture, right lower leg, initial encounter for fracture: Secondary | ICD-10-CM | POA: Diagnosis not present

## 2022-01-19 DIAGNOSIS — I82401 Acute embolism and thrombosis of unspecified deep veins of right lower extremity: Secondary | ICD-10-CM | POA: Diagnosis not present

## 2022-01-19 DIAGNOSIS — E46 Unspecified protein-calorie malnutrition: Secondary | ICD-10-CM | POA: Diagnosis not present

## 2022-01-19 DIAGNOSIS — I639 Cerebral infarction, unspecified: Secondary | ICD-10-CM | POA: Diagnosis not present

## 2022-01-19 DIAGNOSIS — K219 Gastro-esophageal reflux disease without esophagitis: Secondary | ICD-10-CM | POA: Diagnosis not present

## 2022-01-19 DIAGNOSIS — N179 Acute kidney failure, unspecified: Secondary | ICD-10-CM | POA: Diagnosis not present

## 2022-01-19 DIAGNOSIS — F329 Major depressive disorder, single episode, unspecified: Secondary | ICD-10-CM | POA: Diagnosis not present

## 2022-01-19 DIAGNOSIS — R2681 Unsteadiness on feet: Secondary | ICD-10-CM | POA: Diagnosis not present

## 2022-01-19 DIAGNOSIS — R278 Other lack of coordination: Secondary | ICD-10-CM | POA: Diagnosis not present

## 2022-01-19 DIAGNOSIS — N132 Hydronephrosis with renal and ureteral calculous obstruction: Secondary | ICD-10-CM | POA: Diagnosis not present

## 2022-01-19 DIAGNOSIS — G9341 Metabolic encephalopathy: Secondary | ICD-10-CM | POA: Diagnosis not present

## 2022-01-19 DIAGNOSIS — D68312 Antiphospholipid antibody with hemorrhagic disorder: Secondary | ICD-10-CM | POA: Diagnosis not present

## 2022-01-22 DIAGNOSIS — R296 Repeated falls: Secondary | ICD-10-CM | POA: Diagnosis not present

## 2022-01-22 DIAGNOSIS — E46 Unspecified protein-calorie malnutrition: Secondary | ICD-10-CM | POA: Diagnosis not present

## 2022-01-22 DIAGNOSIS — I82401 Acute embolism and thrombosis of unspecified deep veins of right lower extremity: Secondary | ICD-10-CM | POA: Diagnosis not present

## 2022-01-23 DIAGNOSIS — I82401 Acute embolism and thrombosis of unspecified deep veins of right lower extremity: Secondary | ICD-10-CM | POA: Diagnosis not present

## 2022-01-23 DIAGNOSIS — N390 Urinary tract infection, site not specified: Secondary | ICD-10-CM | POA: Diagnosis not present

## 2022-01-23 DIAGNOSIS — E46 Unspecified protein-calorie malnutrition: Secondary | ICD-10-CM | POA: Diagnosis not present

## 2022-01-23 DIAGNOSIS — G4700 Insomnia, unspecified: Secondary | ICD-10-CM | POA: Diagnosis not present

## 2022-01-24 DIAGNOSIS — M6281 Muscle weakness (generalized): Secondary | ICD-10-CM | POA: Diagnosis not present

## 2022-01-24 DIAGNOSIS — E46 Unspecified protein-calorie malnutrition: Secondary | ICD-10-CM | POA: Diagnosis not present

## 2022-01-24 DIAGNOSIS — M80061A Age-related osteoporosis with current pathological fracture, right lower leg, initial encounter for fracture: Secondary | ICD-10-CM | POA: Diagnosis not present

## 2022-01-24 DIAGNOSIS — I6350 Cerebral infarction due to unspecified occlusion or stenosis of unspecified cerebral artery: Secondary | ICD-10-CM | POA: Diagnosis not present

## 2022-01-24 DIAGNOSIS — I82401 Acute embolism and thrombosis of unspecified deep veins of right lower extremity: Secondary | ICD-10-CM | POA: Diagnosis not present

## 2022-01-24 DIAGNOSIS — K219 Gastro-esophageal reflux disease without esophagitis: Secondary | ICD-10-CM | POA: Diagnosis not present

## 2022-01-24 DIAGNOSIS — R296 Repeated falls: Secondary | ICD-10-CM | POA: Diagnosis not present

## 2022-01-24 DIAGNOSIS — D68312 Antiphospholipid antibody with hemorrhagic disorder: Secondary | ICD-10-CM | POA: Diagnosis not present

## 2022-01-24 DIAGNOSIS — R2681 Unsteadiness on feet: Secondary | ICD-10-CM | POA: Diagnosis not present

## 2022-01-24 DIAGNOSIS — G9341 Metabolic encephalopathy: Secondary | ICD-10-CM | POA: Diagnosis not present

## 2022-01-24 DIAGNOSIS — R278 Other lack of coordination: Secondary | ICD-10-CM | POA: Diagnosis not present

## 2022-01-24 DIAGNOSIS — F329 Major depressive disorder, single episode, unspecified: Secondary | ICD-10-CM | POA: Diagnosis not present

## 2022-01-24 DIAGNOSIS — I639 Cerebral infarction, unspecified: Secondary | ICD-10-CM | POA: Diagnosis not present

## 2022-01-24 DIAGNOSIS — N179 Acute kidney failure, unspecified: Secondary | ICD-10-CM | POA: Diagnosis not present

## 2022-01-25 DIAGNOSIS — F5105 Insomnia due to other mental disorder: Secondary | ICD-10-CM | POA: Diagnosis not present

## 2022-01-25 DIAGNOSIS — F331 Major depressive disorder, recurrent, moderate: Secondary | ICD-10-CM | POA: Diagnosis not present

## 2022-01-25 DIAGNOSIS — F01B4 Vascular dementia, moderate, with anxiety: Secondary | ICD-10-CM | POA: Diagnosis not present

## 2022-01-25 DIAGNOSIS — R451 Restlessness and agitation: Secondary | ICD-10-CM | POA: Diagnosis not present

## 2022-01-25 DIAGNOSIS — N189 Chronic kidney disease, unspecified: Secondary | ICD-10-CM | POA: Diagnosis not present

## 2022-01-25 DIAGNOSIS — E46 Unspecified protein-calorie malnutrition: Secondary | ICD-10-CM | POA: Diagnosis not present

## 2022-01-26 DIAGNOSIS — R278 Other lack of coordination: Secondary | ICD-10-CM | POA: Diagnosis not present

## 2022-01-26 DIAGNOSIS — R2681 Unsteadiness on feet: Secondary | ICD-10-CM | POA: Diagnosis not present

## 2022-01-26 DIAGNOSIS — E46 Unspecified protein-calorie malnutrition: Secondary | ICD-10-CM | POA: Diagnosis not present

## 2022-01-26 DIAGNOSIS — M80061A Age-related osteoporosis with current pathological fracture, right lower leg, initial encounter for fracture: Secondary | ICD-10-CM | POA: Diagnosis not present

## 2022-01-26 DIAGNOSIS — M6281 Muscle weakness (generalized): Secondary | ICD-10-CM | POA: Diagnosis not present

## 2022-01-26 DIAGNOSIS — G9341 Metabolic encephalopathy: Secondary | ICD-10-CM | POA: Diagnosis not present

## 2022-01-26 DIAGNOSIS — D68312 Antiphospholipid antibody with hemorrhagic disorder: Secondary | ICD-10-CM | POA: Diagnosis not present

## 2022-01-26 DIAGNOSIS — F329 Major depressive disorder, single episode, unspecified: Secondary | ICD-10-CM | POA: Diagnosis not present

## 2022-01-26 DIAGNOSIS — N179 Acute kidney failure, unspecified: Secondary | ICD-10-CM | POA: Diagnosis not present

## 2022-01-26 DIAGNOSIS — K219 Gastro-esophageal reflux disease without esophagitis: Secondary | ICD-10-CM | POA: Diagnosis not present

## 2022-01-26 DIAGNOSIS — I82401 Acute embolism and thrombosis of unspecified deep veins of right lower extremity: Secondary | ICD-10-CM | POA: Diagnosis not present

## 2022-01-26 DIAGNOSIS — I6350 Cerebral infarction due to unspecified occlusion or stenosis of unspecified cerebral artery: Secondary | ICD-10-CM | POA: Diagnosis not present

## 2022-01-26 DIAGNOSIS — R296 Repeated falls: Secondary | ICD-10-CM | POA: Diagnosis not present

## 2022-01-26 DIAGNOSIS — I639 Cerebral infarction, unspecified: Secondary | ICD-10-CM | POA: Diagnosis not present

## 2022-01-29 DIAGNOSIS — D68312 Antiphospholipid antibody with hemorrhagic disorder: Secondary | ICD-10-CM | POA: Diagnosis not present

## 2022-01-29 DIAGNOSIS — G9341 Metabolic encephalopathy: Secondary | ICD-10-CM | POA: Diagnosis not present

## 2022-01-29 DIAGNOSIS — I639 Cerebral infarction, unspecified: Secondary | ICD-10-CM | POA: Diagnosis not present

## 2022-01-29 DIAGNOSIS — K219 Gastro-esophageal reflux disease without esophagitis: Secondary | ICD-10-CM | POA: Diagnosis not present

## 2022-01-29 DIAGNOSIS — N179 Acute kidney failure, unspecified: Secondary | ICD-10-CM | POA: Diagnosis not present

## 2022-01-29 DIAGNOSIS — M80061A Age-related osteoporosis with current pathological fracture, right lower leg, initial encounter for fracture: Secondary | ICD-10-CM | POA: Diagnosis not present

## 2022-01-29 DIAGNOSIS — R278 Other lack of coordination: Secondary | ICD-10-CM | POA: Diagnosis not present

## 2022-01-29 DIAGNOSIS — M6281 Muscle weakness (generalized): Secondary | ICD-10-CM | POA: Diagnosis not present

## 2022-01-29 DIAGNOSIS — R2681 Unsteadiness on feet: Secondary | ICD-10-CM | POA: Diagnosis not present

## 2022-01-29 DIAGNOSIS — F329 Major depressive disorder, single episode, unspecified: Secondary | ICD-10-CM | POA: Diagnosis not present

## 2022-01-30 DIAGNOSIS — E46 Unspecified protein-calorie malnutrition: Secondary | ICD-10-CM | POA: Diagnosis not present

## 2022-01-30 DIAGNOSIS — I6350 Cerebral infarction due to unspecified occlusion or stenosis of unspecified cerebral artery: Secondary | ICD-10-CM | POA: Diagnosis not present

## 2022-01-30 DIAGNOSIS — I82401 Acute embolism and thrombosis of unspecified deep veins of right lower extremity: Secondary | ICD-10-CM | POA: Diagnosis not present

## 2022-01-30 DIAGNOSIS — R41841 Cognitive communication deficit: Secondary | ICD-10-CM | POA: Diagnosis not present

## 2022-01-31 DIAGNOSIS — M6281 Muscle weakness (generalized): Secondary | ICD-10-CM | POA: Diagnosis not present

## 2022-01-31 DIAGNOSIS — I639 Cerebral infarction, unspecified: Secondary | ICD-10-CM | POA: Diagnosis not present

## 2022-01-31 DIAGNOSIS — R2681 Unsteadiness on feet: Secondary | ICD-10-CM | POA: Diagnosis not present

## 2022-01-31 DIAGNOSIS — N179 Acute kidney failure, unspecified: Secondary | ICD-10-CM | POA: Diagnosis not present

## 2022-01-31 DIAGNOSIS — K219 Gastro-esophageal reflux disease without esophagitis: Secondary | ICD-10-CM | POA: Diagnosis not present

## 2022-01-31 DIAGNOSIS — G9341 Metabolic encephalopathy: Secondary | ICD-10-CM | POA: Diagnosis not present

## 2022-01-31 DIAGNOSIS — R278 Other lack of coordination: Secondary | ICD-10-CM | POA: Diagnosis not present

## 2022-01-31 DIAGNOSIS — D68312 Antiphospholipid antibody with hemorrhagic disorder: Secondary | ICD-10-CM | POA: Diagnosis not present

## 2022-01-31 DIAGNOSIS — F329 Major depressive disorder, single episode, unspecified: Secondary | ICD-10-CM | POA: Diagnosis not present

## 2022-01-31 DIAGNOSIS — M80061A Age-related osteoporosis with current pathological fracture, right lower leg, initial encounter for fracture: Secondary | ICD-10-CM | POA: Diagnosis not present

## 2022-02-02 DIAGNOSIS — I639 Cerebral infarction, unspecified: Secondary | ICD-10-CM | POA: Diagnosis not present

## 2022-02-02 DIAGNOSIS — G4700 Insomnia, unspecified: Secondary | ICD-10-CM | POA: Diagnosis not present

## 2022-02-02 DIAGNOSIS — D68312 Antiphospholipid antibody with hemorrhagic disorder: Secondary | ICD-10-CM | POA: Diagnosis not present

## 2022-02-02 DIAGNOSIS — G9341 Metabolic encephalopathy: Secondary | ICD-10-CM | POA: Diagnosis not present

## 2022-02-02 DIAGNOSIS — M6281 Muscle weakness (generalized): Secondary | ICD-10-CM | POA: Diagnosis not present

## 2022-02-02 DIAGNOSIS — I82401 Acute embolism and thrombosis of unspecified deep veins of right lower extremity: Secondary | ICD-10-CM | POA: Diagnosis not present

## 2022-02-02 DIAGNOSIS — E46 Unspecified protein-calorie malnutrition: Secondary | ICD-10-CM | POA: Diagnosis not present

## 2022-02-02 DIAGNOSIS — K219 Gastro-esophageal reflux disease without esophagitis: Secondary | ICD-10-CM | POA: Diagnosis not present

## 2022-02-02 DIAGNOSIS — F329 Major depressive disorder, single episode, unspecified: Secondary | ICD-10-CM | POA: Diagnosis not present

## 2022-02-02 DIAGNOSIS — N179 Acute kidney failure, unspecified: Secondary | ICD-10-CM | POA: Diagnosis not present

## 2022-02-02 DIAGNOSIS — R2681 Unsteadiness on feet: Secondary | ICD-10-CM | POA: Diagnosis not present

## 2022-02-02 DIAGNOSIS — R278 Other lack of coordination: Secondary | ICD-10-CM | POA: Diagnosis not present

## 2022-02-02 DIAGNOSIS — F331 Major depressive disorder, recurrent, moderate: Secondary | ICD-10-CM | POA: Diagnosis not present

## 2022-02-02 DIAGNOSIS — F5104 Psychophysiologic insomnia: Secondary | ICD-10-CM | POA: Diagnosis not present

## 2022-02-02 DIAGNOSIS — I6350 Cerebral infarction due to unspecified occlusion or stenosis of unspecified cerebral artery: Secondary | ICD-10-CM | POA: Diagnosis not present

## 2022-02-02 DIAGNOSIS — M80061A Age-related osteoporosis with current pathological fracture, right lower leg, initial encounter for fracture: Secondary | ICD-10-CM | POA: Diagnosis not present

## 2022-02-05 DIAGNOSIS — M80061A Age-related osteoporosis with current pathological fracture, right lower leg, initial encounter for fracture: Secondary | ICD-10-CM | POA: Diagnosis not present

## 2022-02-05 DIAGNOSIS — E46 Unspecified protein-calorie malnutrition: Secondary | ICD-10-CM | POA: Diagnosis not present

## 2022-02-05 DIAGNOSIS — I6350 Cerebral infarction due to unspecified occlusion or stenosis of unspecified cerebral artery: Secondary | ICD-10-CM | POA: Diagnosis not present

## 2022-02-05 DIAGNOSIS — M6281 Muscle weakness (generalized): Secondary | ICD-10-CM | POA: Diagnosis not present

## 2022-02-05 DIAGNOSIS — D68312 Antiphospholipid antibody with hemorrhagic disorder: Secondary | ICD-10-CM | POA: Diagnosis not present

## 2022-02-05 DIAGNOSIS — G9341 Metabolic encephalopathy: Secondary | ICD-10-CM | POA: Diagnosis not present

## 2022-02-05 DIAGNOSIS — F329 Major depressive disorder, single episode, unspecified: Secondary | ICD-10-CM | POA: Diagnosis not present

## 2022-02-05 DIAGNOSIS — R278 Other lack of coordination: Secondary | ICD-10-CM | POA: Diagnosis not present

## 2022-02-05 DIAGNOSIS — G4700 Insomnia, unspecified: Secondary | ICD-10-CM | POA: Diagnosis not present

## 2022-02-05 DIAGNOSIS — N179 Acute kidney failure, unspecified: Secondary | ICD-10-CM | POA: Diagnosis not present

## 2022-02-05 DIAGNOSIS — R2681 Unsteadiness on feet: Secondary | ICD-10-CM | POA: Diagnosis not present

## 2022-02-05 DIAGNOSIS — I82401 Acute embolism and thrombosis of unspecified deep veins of right lower extremity: Secondary | ICD-10-CM | POA: Diagnosis not present

## 2022-02-05 DIAGNOSIS — K219 Gastro-esophageal reflux disease without esophagitis: Secondary | ICD-10-CM | POA: Diagnosis not present

## 2022-02-05 DIAGNOSIS — I639 Cerebral infarction, unspecified: Secondary | ICD-10-CM | POA: Diagnosis not present

## 2022-02-06 DIAGNOSIS — E46 Unspecified protein-calorie malnutrition: Secondary | ICD-10-CM | POA: Diagnosis not present

## 2022-02-06 DIAGNOSIS — I6350 Cerebral infarction due to unspecified occlusion or stenosis of unspecified cerebral artery: Secondary | ICD-10-CM | POA: Diagnosis not present

## 2022-02-06 DIAGNOSIS — I82401 Acute embolism and thrombosis of unspecified deep veins of right lower extremity: Secondary | ICD-10-CM | POA: Diagnosis not present

## 2022-02-06 DIAGNOSIS — G4700 Insomnia, unspecified: Secondary | ICD-10-CM | POA: Diagnosis not present

## 2022-02-07 DIAGNOSIS — F329 Major depressive disorder, single episode, unspecified: Secondary | ICD-10-CM | POA: Diagnosis not present

## 2022-02-07 DIAGNOSIS — G9341 Metabolic encephalopathy: Secondary | ICD-10-CM | POA: Diagnosis not present

## 2022-02-07 DIAGNOSIS — M6281 Muscle weakness (generalized): Secondary | ICD-10-CM | POA: Diagnosis not present

## 2022-02-07 DIAGNOSIS — M80061A Age-related osteoporosis with current pathological fracture, right lower leg, initial encounter for fracture: Secondary | ICD-10-CM | POA: Diagnosis not present

## 2022-02-07 DIAGNOSIS — R2681 Unsteadiness on feet: Secondary | ICD-10-CM | POA: Diagnosis not present

## 2022-02-07 DIAGNOSIS — D68312 Antiphospholipid antibody with hemorrhagic disorder: Secondary | ICD-10-CM | POA: Diagnosis not present

## 2022-02-07 DIAGNOSIS — I639 Cerebral infarction, unspecified: Secondary | ICD-10-CM | POA: Diagnosis not present

## 2022-02-07 DIAGNOSIS — R278 Other lack of coordination: Secondary | ICD-10-CM | POA: Diagnosis not present

## 2022-02-07 DIAGNOSIS — K219 Gastro-esophageal reflux disease without esophagitis: Secondary | ICD-10-CM | POA: Diagnosis not present

## 2022-02-07 DIAGNOSIS — N179 Acute kidney failure, unspecified: Secondary | ICD-10-CM | POA: Diagnosis not present

## 2022-02-08 DIAGNOSIS — G4700 Insomnia, unspecified: Secondary | ICD-10-CM | POA: Diagnosis not present

## 2022-02-08 DIAGNOSIS — I6350 Cerebral infarction due to unspecified occlusion or stenosis of unspecified cerebral artery: Secondary | ICD-10-CM | POA: Diagnosis not present

## 2022-02-08 DIAGNOSIS — E46 Unspecified protein-calorie malnutrition: Secondary | ICD-10-CM | POA: Diagnosis not present

## 2022-02-09 DIAGNOSIS — D6832 Hemorrhagic disorder due to extrinsic circulating anticoagulants: Secondary | ICD-10-CM | POA: Diagnosis not present

## 2022-02-10 DIAGNOSIS — D6832 Hemorrhagic disorder due to extrinsic circulating anticoagulants: Secondary | ICD-10-CM | POA: Diagnosis not present

## 2022-02-10 DIAGNOSIS — G4700 Insomnia, unspecified: Secondary | ICD-10-CM | POA: Diagnosis not present

## 2022-02-10 DIAGNOSIS — I6350 Cerebral infarction due to unspecified occlusion or stenosis of unspecified cerebral artery: Secondary | ICD-10-CM | POA: Diagnosis not present

## 2022-02-10 DIAGNOSIS — I82401 Acute embolism and thrombosis of unspecified deep veins of right lower extremity: Secondary | ICD-10-CM | POA: Diagnosis not present

## 2022-02-12 DIAGNOSIS — Z7901 Long term (current) use of anticoagulants: Secondary | ICD-10-CM | POA: Diagnosis not present

## 2022-02-12 DIAGNOSIS — Z79899 Other long term (current) drug therapy: Secondary | ICD-10-CM | POA: Diagnosis not present

## 2022-02-13 DIAGNOSIS — I6350 Cerebral infarction due to unspecified occlusion or stenosis of unspecified cerebral artery: Secondary | ICD-10-CM | POA: Diagnosis not present

## 2022-02-13 DIAGNOSIS — G4700 Insomnia, unspecified: Secondary | ICD-10-CM | POA: Diagnosis not present

## 2022-02-13 DIAGNOSIS — I82401 Acute embolism and thrombosis of unspecified deep veins of right lower extremity: Secondary | ICD-10-CM | POA: Diagnosis not present

## 2022-02-15 DIAGNOSIS — R451 Restlessness and agitation: Secondary | ICD-10-CM | POA: Diagnosis not present

## 2022-02-15 DIAGNOSIS — R41841 Cognitive communication deficit: Secondary | ICD-10-CM | POA: Diagnosis not present

## 2022-02-15 DIAGNOSIS — N390 Urinary tract infection, site not specified: Secondary | ICD-10-CM | POA: Diagnosis not present

## 2022-02-15 DIAGNOSIS — I82401 Acute embolism and thrombosis of unspecified deep veins of right lower extremity: Secondary | ICD-10-CM | POA: Diagnosis not present

## 2022-02-15 DIAGNOSIS — D6832 Hemorrhagic disorder due to extrinsic circulating anticoagulants: Secondary | ICD-10-CM | POA: Diagnosis not present

## 2022-02-15 DIAGNOSIS — G4700 Insomnia, unspecified: Secondary | ICD-10-CM | POA: Diagnosis not present

## 2022-02-16 DIAGNOSIS — R531 Weakness: Secondary | ICD-10-CM | POA: Diagnosis not present

## 2022-02-16 DIAGNOSIS — R451 Restlessness and agitation: Secondary | ICD-10-CM | POA: Diagnosis not present

## 2022-02-16 DIAGNOSIS — I82401 Acute embolism and thrombosis of unspecified deep veins of right lower extremity: Secondary | ICD-10-CM | POA: Diagnosis not present

## 2022-02-16 DIAGNOSIS — H2513 Age-related nuclear cataract, bilateral: Secondary | ICD-10-CM | POA: Diagnosis not present

## 2022-02-16 DIAGNOSIS — H527 Unspecified disorder of refraction: Secondary | ICD-10-CM | POA: Diagnosis not present

## 2022-02-16 DIAGNOSIS — G4700 Insomnia, unspecified: Secondary | ICD-10-CM | POA: Diagnosis not present

## 2022-02-16 DIAGNOSIS — Z8673 Personal history of transient ischemic attack (TIA), and cerebral infarction without residual deficits: Secondary | ICD-10-CM | POA: Diagnosis not present

## 2022-02-16 DIAGNOSIS — I6350 Cerebral infarction due to unspecified occlusion or stenosis of unspecified cerebral artery: Secondary | ICD-10-CM | POA: Diagnosis not present

## 2022-02-18 DIAGNOSIS — I6350 Cerebral infarction due to unspecified occlusion or stenosis of unspecified cerebral artery: Secondary | ICD-10-CM | POA: Diagnosis not present

## 2022-02-18 DIAGNOSIS — D649 Anemia, unspecified: Secondary | ICD-10-CM | POA: Diagnosis not present

## 2022-02-18 DIAGNOSIS — R451 Restlessness and agitation: Secondary | ICD-10-CM | POA: Diagnosis not present

## 2022-02-18 DIAGNOSIS — I82401 Acute embolism and thrombosis of unspecified deep veins of right lower extremity: Secondary | ICD-10-CM | POA: Diagnosis not present

## 2022-02-19 DIAGNOSIS — R41841 Cognitive communication deficit: Secondary | ICD-10-CM | POA: Diagnosis not present

## 2022-02-19 DIAGNOSIS — N390 Urinary tract infection, site not specified: Secondary | ICD-10-CM | POA: Diagnosis not present

## 2022-02-19 DIAGNOSIS — E569 Vitamin deficiency, unspecified: Secondary | ICD-10-CM | POA: Diagnosis not present

## 2022-02-19 DIAGNOSIS — Z79899 Other long term (current) drug therapy: Secondary | ICD-10-CM | POA: Diagnosis not present

## 2022-02-19 DIAGNOSIS — I82401 Acute embolism and thrombosis of unspecified deep veins of right lower extremity: Secondary | ICD-10-CM | POA: Diagnosis not present

## 2022-02-20 DIAGNOSIS — N390 Urinary tract infection, site not specified: Secondary | ICD-10-CM | POA: Diagnosis not present

## 2022-02-20 DIAGNOSIS — I82401 Acute embolism and thrombosis of unspecified deep veins of right lower extremity: Secondary | ICD-10-CM | POA: Diagnosis not present

## 2022-02-20 DIAGNOSIS — D649 Anemia, unspecified: Secondary | ICD-10-CM | POA: Diagnosis not present

## 2022-02-22 DIAGNOSIS — R41841 Cognitive communication deficit: Secondary | ICD-10-CM | POA: Diagnosis not present

## 2022-02-22 DIAGNOSIS — I6350 Cerebral infarction due to unspecified occlusion or stenosis of unspecified cerebral artery: Secondary | ICD-10-CM | POA: Diagnosis not present

## 2022-02-22 DIAGNOSIS — I82401 Acute embolism and thrombosis of unspecified deep veins of right lower extremity: Secondary | ICD-10-CM | POA: Diagnosis not present

## 2022-02-22 DIAGNOSIS — N390 Urinary tract infection, site not specified: Secondary | ICD-10-CM | POA: Diagnosis not present

## 2022-02-22 DIAGNOSIS — D6832 Hemorrhagic disorder due to extrinsic circulating anticoagulants: Secondary | ICD-10-CM | POA: Diagnosis not present

## 2022-02-23 DIAGNOSIS — R41841 Cognitive communication deficit: Secondary | ICD-10-CM | POA: Diagnosis not present

## 2022-02-23 DIAGNOSIS — I6350 Cerebral infarction due to unspecified occlusion or stenosis of unspecified cerebral artery: Secondary | ICD-10-CM | POA: Diagnosis not present

## 2022-02-23 DIAGNOSIS — I82401 Acute embolism and thrombosis of unspecified deep veins of right lower extremity: Secondary | ICD-10-CM | POA: Diagnosis not present

## 2022-02-23 DIAGNOSIS — D6832 Hemorrhagic disorder due to extrinsic circulating anticoagulants: Secondary | ICD-10-CM | POA: Diagnosis not present

## 2022-02-23 DIAGNOSIS — N390 Urinary tract infection, site not specified: Secondary | ICD-10-CM | POA: Diagnosis not present

## 2022-02-25 DIAGNOSIS — N390 Urinary tract infection, site not specified: Secondary | ICD-10-CM | POA: Diagnosis not present

## 2022-02-25 DIAGNOSIS — I82401 Acute embolism and thrombosis of unspecified deep veins of right lower extremity: Secondary | ICD-10-CM | POA: Diagnosis not present

## 2022-02-25 DIAGNOSIS — R41841 Cognitive communication deficit: Secondary | ICD-10-CM | POA: Diagnosis not present

## 2022-02-26 DIAGNOSIS — Z79899 Other long term (current) drug therapy: Secondary | ICD-10-CM | POA: Diagnosis not present

## 2022-02-27 DIAGNOSIS — R52 Pain, unspecified: Secondary | ICD-10-CM | POA: Diagnosis not present

## 2022-02-27 DIAGNOSIS — N390 Urinary tract infection, site not specified: Secondary | ICD-10-CM | POA: Diagnosis not present

## 2022-02-27 DIAGNOSIS — R41841 Cognitive communication deficit: Secondary | ICD-10-CM | POA: Diagnosis not present

## 2022-02-27 DIAGNOSIS — I82401 Acute embolism and thrombosis of unspecified deep veins of right lower extremity: Secondary | ICD-10-CM | POA: Diagnosis not present

## 2022-03-01 DIAGNOSIS — D6832 Hemorrhagic disorder due to extrinsic circulating anticoagulants: Secondary | ICD-10-CM | POA: Diagnosis not present

## 2022-03-02 DIAGNOSIS — N390 Urinary tract infection, site not specified: Secondary | ICD-10-CM | POA: Diagnosis not present

## 2022-03-02 DIAGNOSIS — I82401 Acute embolism and thrombosis of unspecified deep veins of right lower extremity: Secondary | ICD-10-CM | POA: Diagnosis not present

## 2022-03-02 DIAGNOSIS — R41841 Cognitive communication deficit: Secondary | ICD-10-CM | POA: Diagnosis not present

## 2022-03-02 DIAGNOSIS — R52 Pain, unspecified: Secondary | ICD-10-CM | POA: Diagnosis not present

## 2022-03-05 DIAGNOSIS — D6832 Hemorrhagic disorder due to extrinsic circulating anticoagulants: Secondary | ICD-10-CM | POA: Diagnosis not present

## 2022-03-06 DIAGNOSIS — I82401 Acute embolism and thrombosis of unspecified deep veins of right lower extremity: Secondary | ICD-10-CM | POA: Diagnosis not present

## 2022-03-06 DIAGNOSIS — I6350 Cerebral infarction due to unspecified occlusion or stenosis of unspecified cerebral artery: Secondary | ICD-10-CM | POA: Diagnosis not present

## 2022-03-06 DIAGNOSIS — R41841 Cognitive communication deficit: Secondary | ICD-10-CM | POA: Diagnosis not present

## 2022-03-06 DIAGNOSIS — N390 Urinary tract infection, site not specified: Secondary | ICD-10-CM | POA: Diagnosis not present

## 2022-03-07 DIAGNOSIS — Z7901 Long term (current) use of anticoagulants: Secondary | ICD-10-CM | POA: Diagnosis not present

## 2022-03-07 DIAGNOSIS — N1832 Chronic kidney disease, stage 3b: Secondary | ICD-10-CM | POA: Diagnosis not present

## 2022-03-07 DIAGNOSIS — H5347 Heteronymous bilateral field defects: Secondary | ICD-10-CM | POA: Diagnosis not present

## 2022-03-07 DIAGNOSIS — D649 Anemia, unspecified: Secondary | ICD-10-CM | POA: Diagnosis not present

## 2022-03-07 DIAGNOSIS — F0390 Unspecified dementia without behavioral disturbance: Secondary | ICD-10-CM | POA: Diagnosis not present

## 2022-03-07 DIAGNOSIS — N200 Calculus of kidney: Secondary | ICD-10-CM | POA: Diagnosis not present

## 2022-03-07 DIAGNOSIS — I69398 Other sequelae of cerebral infarction: Secondary | ICD-10-CM | POA: Diagnosis not present

## 2022-03-07 DIAGNOSIS — E039 Hypothyroidism, unspecified: Secondary | ICD-10-CM | POA: Diagnosis not present

## 2022-03-07 DIAGNOSIS — Z79899 Other long term (current) drug therapy: Secondary | ICD-10-CM | POA: Diagnosis not present

## 2022-03-07 DIAGNOSIS — Z880 Allergy status to penicillin: Secondary | ICD-10-CM | POA: Diagnosis not present

## 2022-03-07 DIAGNOSIS — Z86718 Personal history of other venous thrombosis and embolism: Secondary | ICD-10-CM | POA: Diagnosis not present

## 2022-03-07 DIAGNOSIS — I129 Hypertensive chronic kidney disease with stage 1 through stage 4 chronic kidney disease, or unspecified chronic kidney disease: Secondary | ICD-10-CM | POA: Diagnosis not present

## 2022-03-07 DIAGNOSIS — D6861 Antiphospholipid syndrome: Secondary | ICD-10-CM | POA: Diagnosis not present

## 2022-03-07 DIAGNOSIS — E785 Hyperlipidemia, unspecified: Secondary | ICD-10-CM | POA: Diagnosis not present

## 2022-03-07 DIAGNOSIS — Z87442 Personal history of urinary calculi: Secondary | ICD-10-CM | POA: Diagnosis not present

## 2022-03-07 DIAGNOSIS — Z79890 Hormone replacement therapy (postmenopausal): Secondary | ICD-10-CM | POA: Diagnosis not present

## 2022-03-08 DIAGNOSIS — Z79899 Other long term (current) drug therapy: Secondary | ICD-10-CM | POA: Diagnosis not present

## 2022-03-09 DIAGNOSIS — G4700 Insomnia, unspecified: Secondary | ICD-10-CM | POA: Diagnosis not present

## 2022-03-09 DIAGNOSIS — I6350 Cerebral infarction due to unspecified occlusion or stenosis of unspecified cerebral artery: Secondary | ICD-10-CM | POA: Diagnosis not present

## 2022-03-09 DIAGNOSIS — R41841 Cognitive communication deficit: Secondary | ICD-10-CM | POA: Diagnosis not present

## 2022-03-09 DIAGNOSIS — I82401 Acute embolism and thrombosis of unspecified deep veins of right lower extremity: Secondary | ICD-10-CM | POA: Diagnosis not present

## 2022-03-10 DIAGNOSIS — R278 Other lack of coordination: Secondary | ICD-10-CM | POA: Diagnosis not present

## 2022-03-10 DIAGNOSIS — I639 Cerebral infarction, unspecified: Secondary | ICD-10-CM | POA: Diagnosis not present

## 2022-03-10 DIAGNOSIS — M6281 Muscle weakness (generalized): Secondary | ICD-10-CM | POA: Diagnosis not present

## 2022-03-10 DIAGNOSIS — R41841 Cognitive communication deficit: Secondary | ICD-10-CM | POA: Diagnosis not present

## 2022-03-12 DIAGNOSIS — Z79899 Other long term (current) drug therapy: Secondary | ICD-10-CM | POA: Diagnosis not present

## 2022-03-13 DIAGNOSIS — I6350 Cerebral infarction due to unspecified occlusion or stenosis of unspecified cerebral artery: Secondary | ICD-10-CM | POA: Diagnosis not present

## 2022-03-13 DIAGNOSIS — R41841 Cognitive communication deficit: Secondary | ICD-10-CM | POA: Diagnosis not present

## 2022-03-13 DIAGNOSIS — I82401 Acute embolism and thrombosis of unspecified deep veins of right lower extremity: Secondary | ICD-10-CM | POA: Diagnosis not present

## 2022-03-15 DIAGNOSIS — D6832 Hemorrhagic disorder due to extrinsic circulating anticoagulants: Secondary | ICD-10-CM | POA: Diagnosis not present

## 2022-03-16 DIAGNOSIS — G4700 Insomnia, unspecified: Secondary | ICD-10-CM | POA: Diagnosis not present

## 2022-03-16 DIAGNOSIS — R41841 Cognitive communication deficit: Secondary | ICD-10-CM | POA: Diagnosis not present

## 2022-03-16 DIAGNOSIS — I6350 Cerebral infarction due to unspecified occlusion or stenosis of unspecified cerebral artery: Secondary | ICD-10-CM | POA: Diagnosis not present

## 2022-03-16 DIAGNOSIS — I82401 Acute embolism and thrombosis of unspecified deep veins of right lower extremity: Secondary | ICD-10-CM | POA: Diagnosis not present

## 2022-03-19 DIAGNOSIS — S90811A Abrasion, right foot, initial encounter: Secondary | ICD-10-CM | POA: Diagnosis not present

## 2022-03-19 DIAGNOSIS — I6350 Cerebral infarction due to unspecified occlusion or stenosis of unspecified cerebral artery: Secondary | ICD-10-CM | POA: Diagnosis not present

## 2022-03-19 DIAGNOSIS — I82401 Acute embolism and thrombosis of unspecified deep veins of right lower extremity: Secondary | ICD-10-CM | POA: Diagnosis not present

## 2022-03-19 DIAGNOSIS — D6832 Hemorrhagic disorder due to extrinsic circulating anticoagulants: Secondary | ICD-10-CM | POA: Diagnosis not present

## 2022-03-19 DIAGNOSIS — F5104 Psychophysiologic insomnia: Secondary | ICD-10-CM | POA: Diagnosis not present

## 2022-03-19 DIAGNOSIS — F339 Major depressive disorder, recurrent, unspecified: Secondary | ICD-10-CM | POA: Diagnosis not present

## 2022-03-19 DIAGNOSIS — R41841 Cognitive communication deficit: Secondary | ICD-10-CM | POA: Diagnosis not present

## 2022-03-20 DIAGNOSIS — S90811A Abrasion, right foot, initial encounter: Secondary | ICD-10-CM | POA: Diagnosis not present

## 2022-03-20 DIAGNOSIS — I82401 Acute embolism and thrombosis of unspecified deep veins of right lower extremity: Secondary | ICD-10-CM | POA: Diagnosis not present

## 2022-03-20 DIAGNOSIS — I6350 Cerebral infarction due to unspecified occlusion or stenosis of unspecified cerebral artery: Secondary | ICD-10-CM | POA: Diagnosis not present

## 2022-03-20 DIAGNOSIS — R41841 Cognitive communication deficit: Secondary | ICD-10-CM | POA: Diagnosis not present

## 2022-03-23 DIAGNOSIS — I82401 Acute embolism and thrombosis of unspecified deep veins of right lower extremity: Secondary | ICD-10-CM | POA: Diagnosis not present

## 2022-03-23 DIAGNOSIS — S90811A Abrasion, right foot, initial encounter: Secondary | ICD-10-CM | POA: Diagnosis not present

## 2022-03-23 DIAGNOSIS — Z7901 Long term (current) use of anticoagulants: Secondary | ICD-10-CM | POA: Diagnosis not present

## 2022-03-23 DIAGNOSIS — I6350 Cerebral infarction due to unspecified occlusion or stenosis of unspecified cerebral artery: Secondary | ICD-10-CM | POA: Diagnosis not present

## 2022-03-23 DIAGNOSIS — R41841 Cognitive communication deficit: Secondary | ICD-10-CM | POA: Diagnosis not present

## 2022-03-26 DIAGNOSIS — D6832 Hemorrhagic disorder due to extrinsic circulating anticoagulants: Secondary | ICD-10-CM | POA: Diagnosis not present

## 2022-03-28 DIAGNOSIS — R278 Other lack of coordination: Secondary | ICD-10-CM | POA: Diagnosis not present

## 2022-03-28 DIAGNOSIS — M6281 Muscle weakness (generalized): Secondary | ICD-10-CM | POA: Diagnosis not present

## 2022-03-28 DIAGNOSIS — I82401 Acute embolism and thrombosis of unspecified deep veins of right lower extremity: Secondary | ICD-10-CM | POA: Diagnosis not present

## 2022-03-28 DIAGNOSIS — I639 Cerebral infarction, unspecified: Secondary | ICD-10-CM | POA: Diagnosis not present

## 2022-03-28 DIAGNOSIS — I6350 Cerebral infarction due to unspecified occlusion or stenosis of unspecified cerebral artery: Secondary | ICD-10-CM | POA: Diagnosis not present

## 2022-03-28 DIAGNOSIS — R41841 Cognitive communication deficit: Secondary | ICD-10-CM | POA: Diagnosis not present

## 2022-03-29 DIAGNOSIS — M6281 Muscle weakness (generalized): Secondary | ICD-10-CM | POA: Diagnosis not present

## 2022-03-29 DIAGNOSIS — R2681 Unsteadiness on feet: Secondary | ICD-10-CM | POA: Diagnosis not present

## 2022-03-29 DIAGNOSIS — R296 Repeated falls: Secondary | ICD-10-CM | POA: Diagnosis not present

## 2022-03-29 DIAGNOSIS — D6832 Hemorrhagic disorder due to extrinsic circulating anticoagulants: Secondary | ICD-10-CM | POA: Diagnosis not present

## 2022-03-29 DIAGNOSIS — G9341 Metabolic encephalopathy: Secondary | ICD-10-CM | POA: Diagnosis not present

## 2022-03-29 DIAGNOSIS — G4700 Insomnia, unspecified: Secondary | ICD-10-CM | POA: Diagnosis not present

## 2022-03-29 DIAGNOSIS — R278 Other lack of coordination: Secondary | ICD-10-CM | POA: Diagnosis not present

## 2022-03-29 DIAGNOSIS — Z79899 Other long term (current) drug therapy: Secondary | ICD-10-CM | POA: Diagnosis not present

## 2022-03-29 DIAGNOSIS — R41841 Cognitive communication deficit: Secondary | ICD-10-CM | POA: Diagnosis not present

## 2022-03-29 DIAGNOSIS — M80061A Age-related osteoporosis with current pathological fracture, right lower leg, initial encounter for fracture: Secondary | ICD-10-CM | POA: Diagnosis not present

## 2022-03-29 DIAGNOSIS — K219 Gastro-esophageal reflux disease without esophagitis: Secondary | ICD-10-CM | POA: Diagnosis not present

## 2022-03-29 DIAGNOSIS — I6350 Cerebral infarction due to unspecified occlusion or stenosis of unspecified cerebral artery: Secondary | ICD-10-CM | POA: Diagnosis not present

## 2022-03-29 DIAGNOSIS — R531 Weakness: Secondary | ICD-10-CM | POA: Diagnosis not present

## 2022-03-29 DIAGNOSIS — N179 Acute kidney failure, unspecified: Secondary | ICD-10-CM | POA: Diagnosis not present

## 2022-03-29 DIAGNOSIS — D68312 Antiphospholipid antibody with hemorrhagic disorder: Secondary | ICD-10-CM | POA: Diagnosis not present

## 2022-03-29 DIAGNOSIS — I639 Cerebral infarction, unspecified: Secondary | ICD-10-CM | POA: Diagnosis not present

## 2022-03-29 DIAGNOSIS — F329 Major depressive disorder, single episode, unspecified: Secondary | ICD-10-CM | POA: Diagnosis not present

## 2022-03-30 DIAGNOSIS — I82401 Acute embolism and thrombosis of unspecified deep veins of right lower extremity: Secondary | ICD-10-CM | POA: Diagnosis not present

## 2022-03-30 DIAGNOSIS — R41841 Cognitive communication deficit: Secondary | ICD-10-CM | POA: Diagnosis not present

## 2022-03-30 DIAGNOSIS — I6350 Cerebral infarction due to unspecified occlusion or stenosis of unspecified cerebral artery: Secondary | ICD-10-CM | POA: Diagnosis not present

## 2022-03-30 DIAGNOSIS — D649 Anemia, unspecified: Secondary | ICD-10-CM | POA: Diagnosis not present

## 2022-04-02 DIAGNOSIS — R41841 Cognitive communication deficit: Secondary | ICD-10-CM | POA: Diagnosis not present

## 2022-04-02 DIAGNOSIS — R451 Restlessness and agitation: Secondary | ICD-10-CM | POA: Diagnosis not present

## 2022-04-02 DIAGNOSIS — M6281 Muscle weakness (generalized): Secondary | ICD-10-CM | POA: Diagnosis not present

## 2022-04-02 DIAGNOSIS — I82401 Acute embolism and thrombosis of unspecified deep veins of right lower extremity: Secondary | ICD-10-CM | POA: Diagnosis not present

## 2022-04-02 DIAGNOSIS — I10 Essential (primary) hypertension: Secondary | ICD-10-CM | POA: Diagnosis not present

## 2022-04-02 DIAGNOSIS — D6832 Hemorrhagic disorder due to extrinsic circulating anticoagulants: Secondary | ICD-10-CM | POA: Diagnosis not present

## 2022-04-02 DIAGNOSIS — I639 Cerebral infarction, unspecified: Secondary | ICD-10-CM | POA: Diagnosis not present

## 2022-04-02 DIAGNOSIS — N390 Urinary tract infection, site not specified: Secondary | ICD-10-CM | POA: Diagnosis not present

## 2022-04-02 DIAGNOSIS — R278 Other lack of coordination: Secondary | ICD-10-CM | POA: Diagnosis not present

## 2022-04-03 DIAGNOSIS — I6350 Cerebral infarction due to unspecified occlusion or stenosis of unspecified cerebral artery: Secondary | ICD-10-CM | POA: Diagnosis not present

## 2022-04-03 DIAGNOSIS — I82401 Acute embolism and thrombosis of unspecified deep veins of right lower extremity: Secondary | ICD-10-CM | POA: Diagnosis not present

## 2022-04-03 DIAGNOSIS — M80061A Age-related osteoporosis with current pathological fracture, right lower leg, initial encounter for fracture: Secondary | ICD-10-CM | POA: Diagnosis not present

## 2022-04-03 DIAGNOSIS — N179 Acute kidney failure, unspecified: Secondary | ICD-10-CM | POA: Diagnosis not present

## 2022-04-03 DIAGNOSIS — D68312 Antiphospholipid antibody with hemorrhagic disorder: Secondary | ICD-10-CM | POA: Diagnosis not present

## 2022-04-03 DIAGNOSIS — R451 Restlessness and agitation: Secondary | ICD-10-CM | POA: Diagnosis not present

## 2022-04-03 DIAGNOSIS — I639 Cerebral infarction, unspecified: Secondary | ICD-10-CM | POA: Diagnosis not present

## 2022-04-03 DIAGNOSIS — F329 Major depressive disorder, single episode, unspecified: Secondary | ICD-10-CM | POA: Diagnosis not present

## 2022-04-03 DIAGNOSIS — D519 Vitamin B12 deficiency anemia, unspecified: Secondary | ICD-10-CM | POA: Diagnosis not present

## 2022-04-03 DIAGNOSIS — G9341 Metabolic encephalopathy: Secondary | ICD-10-CM | POA: Diagnosis not present

## 2022-04-03 DIAGNOSIS — M6281 Muscle weakness (generalized): Secondary | ICD-10-CM | POA: Diagnosis not present

## 2022-04-03 DIAGNOSIS — R278 Other lack of coordination: Secondary | ICD-10-CM | POA: Diagnosis not present

## 2022-04-03 DIAGNOSIS — K219 Gastro-esophageal reflux disease without esophagitis: Secondary | ICD-10-CM | POA: Diagnosis not present

## 2022-04-03 DIAGNOSIS — R41841 Cognitive communication deficit: Secondary | ICD-10-CM | POA: Diagnosis not present

## 2022-04-03 DIAGNOSIS — I10 Essential (primary) hypertension: Secondary | ICD-10-CM | POA: Diagnosis not present

## 2022-04-03 DIAGNOSIS — R2681 Unsteadiness on feet: Secondary | ICD-10-CM | POA: Diagnosis not present

## 2022-04-04 DIAGNOSIS — M6281 Muscle weakness (generalized): Secondary | ICD-10-CM | POA: Diagnosis not present

## 2022-04-04 DIAGNOSIS — R278 Other lack of coordination: Secondary | ICD-10-CM | POA: Diagnosis not present

## 2022-04-04 DIAGNOSIS — I6350 Cerebral infarction due to unspecified occlusion or stenosis of unspecified cerebral artery: Secondary | ICD-10-CM | POA: Diagnosis not present

## 2022-04-04 DIAGNOSIS — D519 Vitamin B12 deficiency anemia, unspecified: Secondary | ICD-10-CM | POA: Diagnosis not present

## 2022-04-04 DIAGNOSIS — R451 Restlessness and agitation: Secondary | ICD-10-CM | POA: Diagnosis not present

## 2022-04-04 DIAGNOSIS — I639 Cerebral infarction, unspecified: Secondary | ICD-10-CM | POA: Diagnosis not present

## 2022-04-04 DIAGNOSIS — Z20822 Contact with and (suspected) exposure to covid-19: Secondary | ICD-10-CM | POA: Diagnosis not present

## 2022-04-04 DIAGNOSIS — R41841 Cognitive communication deficit: Secondary | ICD-10-CM | POA: Diagnosis not present

## 2022-04-05 DIAGNOSIS — R278 Other lack of coordination: Secondary | ICD-10-CM | POA: Diagnosis not present

## 2022-04-05 DIAGNOSIS — M6281 Muscle weakness (generalized): Secondary | ICD-10-CM | POA: Diagnosis not present

## 2022-04-05 DIAGNOSIS — D6832 Hemorrhagic disorder due to extrinsic circulating anticoagulants: Secondary | ICD-10-CM | POA: Diagnosis not present

## 2022-04-05 DIAGNOSIS — N390 Urinary tract infection, site not specified: Secondary | ICD-10-CM | POA: Diagnosis not present

## 2022-04-05 DIAGNOSIS — I639 Cerebral infarction, unspecified: Secondary | ICD-10-CM | POA: Diagnosis not present

## 2022-04-05 DIAGNOSIS — R41841 Cognitive communication deficit: Secondary | ICD-10-CM | POA: Diagnosis not present

## 2022-04-05 DIAGNOSIS — I6350 Cerebral infarction due to unspecified occlusion or stenosis of unspecified cerebral artery: Secondary | ICD-10-CM | POA: Diagnosis not present

## 2022-04-06 DIAGNOSIS — M6281 Muscle weakness (generalized): Secondary | ICD-10-CM | POA: Diagnosis not present

## 2022-04-06 DIAGNOSIS — R278 Other lack of coordination: Secondary | ICD-10-CM | POA: Diagnosis not present

## 2022-04-06 DIAGNOSIS — R41841 Cognitive communication deficit: Secondary | ICD-10-CM | POA: Diagnosis not present

## 2022-04-06 DIAGNOSIS — N390 Urinary tract infection, site not specified: Secondary | ICD-10-CM | POA: Diagnosis not present

## 2022-04-06 DIAGNOSIS — I639 Cerebral infarction, unspecified: Secondary | ICD-10-CM | POA: Diagnosis not present

## 2022-04-06 DIAGNOSIS — I82401 Acute embolism and thrombosis of unspecified deep veins of right lower extremity: Secondary | ICD-10-CM | POA: Diagnosis not present

## 2022-04-06 DIAGNOSIS — I6350 Cerebral infarction due to unspecified occlusion or stenosis of unspecified cerebral artery: Secondary | ICD-10-CM | POA: Diagnosis not present

## 2022-04-09 DIAGNOSIS — I82401 Acute embolism and thrombosis of unspecified deep veins of right lower extremity: Secondary | ICD-10-CM | POA: Diagnosis not present

## 2022-04-09 DIAGNOSIS — D519 Vitamin B12 deficiency anemia, unspecified: Secondary | ICD-10-CM | POA: Diagnosis not present

## 2022-04-09 DIAGNOSIS — R41841 Cognitive communication deficit: Secondary | ICD-10-CM | POA: Diagnosis not present

## 2022-04-09 DIAGNOSIS — I6350 Cerebral infarction due to unspecified occlusion or stenosis of unspecified cerebral artery: Secondary | ICD-10-CM | POA: Diagnosis not present

## 2022-04-09 DIAGNOSIS — D6832 Hemorrhagic disorder due to extrinsic circulating anticoagulants: Secondary | ICD-10-CM | POA: Diagnosis not present

## 2022-04-10 DIAGNOSIS — I639 Cerebral infarction, unspecified: Secondary | ICD-10-CM | POA: Diagnosis not present

## 2022-04-10 DIAGNOSIS — M6281 Muscle weakness (generalized): Secondary | ICD-10-CM | POA: Diagnosis not present

## 2022-04-10 DIAGNOSIS — D68312 Antiphospholipid antibody with hemorrhagic disorder: Secondary | ICD-10-CM | POA: Diagnosis not present

## 2022-04-10 DIAGNOSIS — G9341 Metabolic encephalopathy: Secondary | ICD-10-CM | POA: Diagnosis not present

## 2022-04-10 DIAGNOSIS — M80061A Age-related osteoporosis with current pathological fracture, right lower leg, initial encounter for fracture: Secondary | ICD-10-CM | POA: Diagnosis not present

## 2022-04-10 DIAGNOSIS — F329 Major depressive disorder, single episode, unspecified: Secondary | ICD-10-CM | POA: Diagnosis not present

## 2022-04-10 DIAGNOSIS — R41841 Cognitive communication deficit: Secondary | ICD-10-CM | POA: Diagnosis not present

## 2022-04-10 DIAGNOSIS — N390 Urinary tract infection, site not specified: Secondary | ICD-10-CM | POA: Diagnosis not present

## 2022-04-10 DIAGNOSIS — I6350 Cerebral infarction due to unspecified occlusion or stenosis of unspecified cerebral artery: Secondary | ICD-10-CM | POA: Diagnosis not present

## 2022-04-10 DIAGNOSIS — R2681 Unsteadiness on feet: Secondary | ICD-10-CM | POA: Diagnosis not present

## 2022-04-10 DIAGNOSIS — R278 Other lack of coordination: Secondary | ICD-10-CM | POA: Diagnosis not present

## 2022-04-10 DIAGNOSIS — N179 Acute kidney failure, unspecified: Secondary | ICD-10-CM | POA: Diagnosis not present

## 2022-04-10 DIAGNOSIS — K219 Gastro-esophageal reflux disease without esophagitis: Secondary | ICD-10-CM | POA: Diagnosis not present

## 2022-04-10 DIAGNOSIS — I82401 Acute embolism and thrombosis of unspecified deep veins of right lower extremity: Secondary | ICD-10-CM | POA: Diagnosis not present

## 2022-04-11 DIAGNOSIS — R278 Other lack of coordination: Secondary | ICD-10-CM | POA: Diagnosis not present

## 2022-04-11 DIAGNOSIS — M6281 Muscle weakness (generalized): Secondary | ICD-10-CM | POA: Diagnosis not present

## 2022-04-11 DIAGNOSIS — I639 Cerebral infarction, unspecified: Secondary | ICD-10-CM | POA: Diagnosis not present

## 2022-04-12 DIAGNOSIS — I639 Cerebral infarction, unspecified: Secondary | ICD-10-CM | POA: Diagnosis not present

## 2022-04-12 DIAGNOSIS — M6281 Muscle weakness (generalized): Secondary | ICD-10-CM | POA: Diagnosis not present

## 2022-04-12 DIAGNOSIS — R278 Other lack of coordination: Secondary | ICD-10-CM | POA: Diagnosis not present

## 2022-04-12 DIAGNOSIS — D6832 Hemorrhagic disorder due to extrinsic circulating anticoagulants: Secondary | ICD-10-CM | POA: Diagnosis not present

## 2022-04-12 DIAGNOSIS — R296 Repeated falls: Secondary | ICD-10-CM | POA: Diagnosis not present

## 2022-04-12 DIAGNOSIS — R41841 Cognitive communication deficit: Secondary | ICD-10-CM | POA: Diagnosis not present

## 2022-04-12 DIAGNOSIS — N390 Urinary tract infection, site not specified: Secondary | ICD-10-CM | POA: Diagnosis not present

## 2022-04-13 DIAGNOSIS — R41841 Cognitive communication deficit: Secondary | ICD-10-CM | POA: Diagnosis not present

## 2022-04-13 DIAGNOSIS — I82401 Acute embolism and thrombosis of unspecified deep veins of right lower extremity: Secondary | ICD-10-CM | POA: Diagnosis not present

## 2022-04-13 DIAGNOSIS — I6350 Cerebral infarction due to unspecified occlusion or stenosis of unspecified cerebral artery: Secondary | ICD-10-CM | POA: Diagnosis not present

## 2022-04-13 DIAGNOSIS — N390 Urinary tract infection, site not specified: Secondary | ICD-10-CM | POA: Diagnosis not present

## 2022-04-16 DIAGNOSIS — R296 Repeated falls: Secondary | ICD-10-CM | POA: Diagnosis not present

## 2022-04-16 DIAGNOSIS — D6832 Hemorrhagic disorder due to extrinsic circulating anticoagulants: Secondary | ICD-10-CM | POA: Diagnosis not present

## 2022-04-16 DIAGNOSIS — I6350 Cerebral infarction due to unspecified occlusion or stenosis of unspecified cerebral artery: Secondary | ICD-10-CM | POA: Diagnosis not present

## 2022-04-16 DIAGNOSIS — N390 Urinary tract infection, site not specified: Secondary | ICD-10-CM | POA: Diagnosis not present

## 2022-04-16 DIAGNOSIS — R41841 Cognitive communication deficit: Secondary | ICD-10-CM | POA: Diagnosis not present

## 2022-04-17 DIAGNOSIS — I639 Cerebral infarction, unspecified: Secondary | ICD-10-CM | POA: Diagnosis not present

## 2022-04-17 DIAGNOSIS — R296 Repeated falls: Secondary | ICD-10-CM | POA: Diagnosis not present

## 2022-04-17 DIAGNOSIS — M6281 Muscle weakness (generalized): Secondary | ICD-10-CM | POA: Diagnosis not present

## 2022-04-17 DIAGNOSIS — R41841 Cognitive communication deficit: Secondary | ICD-10-CM | POA: Diagnosis not present

## 2022-04-17 DIAGNOSIS — I82401 Acute embolism and thrombosis of unspecified deep veins of right lower extremity: Secondary | ICD-10-CM | POA: Diagnosis not present

## 2022-04-17 DIAGNOSIS — R278 Other lack of coordination: Secondary | ICD-10-CM | POA: Diagnosis not present

## 2022-04-17 DIAGNOSIS — N390 Urinary tract infection, site not specified: Secondary | ICD-10-CM | POA: Diagnosis not present

## 2022-04-18 DIAGNOSIS — F01C11 Vascular dementia, severe, with agitation: Secondary | ICD-10-CM | POA: Diagnosis not present

## 2022-04-18 DIAGNOSIS — I639 Cerebral infarction, unspecified: Secondary | ICD-10-CM | POA: Diagnosis not present

## 2022-04-18 DIAGNOSIS — F01C4 Vascular dementia, severe, with anxiety: Secondary | ICD-10-CM | POA: Diagnosis not present

## 2022-04-18 DIAGNOSIS — R41841 Cognitive communication deficit: Secondary | ICD-10-CM | POA: Diagnosis not present

## 2022-04-18 DIAGNOSIS — F339 Major depressive disorder, recurrent, unspecified: Secondary | ICD-10-CM | POA: Diagnosis not present

## 2022-04-18 DIAGNOSIS — R451 Restlessness and agitation: Secondary | ICD-10-CM | POA: Diagnosis not present

## 2022-04-18 DIAGNOSIS — I69321 Dysphasia following cerebral infarction: Secondary | ICD-10-CM | POA: Diagnosis not present

## 2022-04-18 DIAGNOSIS — I69398 Other sequelae of cerebral infarction: Secondary | ICD-10-CM | POA: Diagnosis not present

## 2022-04-18 DIAGNOSIS — I82401 Acute embolism and thrombosis of unspecified deep veins of right lower extremity: Secondary | ICD-10-CM | POA: Diagnosis not present

## 2022-04-18 DIAGNOSIS — G4700 Insomnia, unspecified: Secondary | ICD-10-CM | POA: Diagnosis not present

## 2022-04-18 DIAGNOSIS — M6281 Muscle weakness (generalized): Secondary | ICD-10-CM | POA: Diagnosis not present

## 2022-04-18 DIAGNOSIS — N390 Urinary tract infection, site not specified: Secondary | ICD-10-CM | POA: Diagnosis not present

## 2022-04-18 DIAGNOSIS — R278 Other lack of coordination: Secondary | ICD-10-CM | POA: Diagnosis not present

## 2022-04-19 DIAGNOSIS — M6281 Muscle weakness (generalized): Secondary | ICD-10-CM | POA: Diagnosis not present

## 2022-04-19 DIAGNOSIS — D6832 Hemorrhagic disorder due to extrinsic circulating anticoagulants: Secondary | ICD-10-CM | POA: Diagnosis not present

## 2022-04-19 DIAGNOSIS — R278 Other lack of coordination: Secondary | ICD-10-CM | POA: Diagnosis not present

## 2022-04-19 DIAGNOSIS — I639 Cerebral infarction, unspecified: Secondary | ICD-10-CM | POA: Diagnosis not present

## 2022-04-20 DIAGNOSIS — F329 Major depressive disorder, single episode, unspecified: Secondary | ICD-10-CM | POA: Diagnosis not present

## 2022-04-20 DIAGNOSIS — I639 Cerebral infarction, unspecified: Secondary | ICD-10-CM | POA: Diagnosis not present

## 2022-04-20 DIAGNOSIS — R278 Other lack of coordination: Secondary | ICD-10-CM | POA: Diagnosis not present

## 2022-04-20 DIAGNOSIS — R2681 Unsteadiness on feet: Secondary | ICD-10-CM | POA: Diagnosis not present

## 2022-04-20 DIAGNOSIS — I82401 Acute embolism and thrombosis of unspecified deep veins of right lower extremity: Secondary | ICD-10-CM | POA: Diagnosis not present

## 2022-04-20 DIAGNOSIS — N179 Acute kidney failure, unspecified: Secondary | ICD-10-CM | POA: Diagnosis not present

## 2022-04-20 DIAGNOSIS — D68312 Antiphospholipid antibody with hemorrhagic disorder: Secondary | ICD-10-CM | POA: Diagnosis not present

## 2022-04-20 DIAGNOSIS — R41841 Cognitive communication deficit: Secondary | ICD-10-CM | POA: Diagnosis not present

## 2022-04-20 DIAGNOSIS — M80061A Age-related osteoporosis with current pathological fracture, right lower leg, initial encounter for fracture: Secondary | ICD-10-CM | POA: Diagnosis not present

## 2022-04-20 DIAGNOSIS — R296 Repeated falls: Secondary | ICD-10-CM | POA: Diagnosis not present

## 2022-04-20 DIAGNOSIS — M6281 Muscle weakness (generalized): Secondary | ICD-10-CM | POA: Diagnosis not present

## 2022-04-20 DIAGNOSIS — R451 Restlessness and agitation: Secondary | ICD-10-CM | POA: Diagnosis not present

## 2022-04-20 DIAGNOSIS — G9341 Metabolic encephalopathy: Secondary | ICD-10-CM | POA: Diagnosis not present

## 2022-04-20 DIAGNOSIS — K219 Gastro-esophageal reflux disease without esophagitis: Secondary | ICD-10-CM | POA: Diagnosis not present

## 2022-04-23 DIAGNOSIS — M80061A Age-related osteoporosis with current pathological fracture, right lower leg, initial encounter for fracture: Secondary | ICD-10-CM | POA: Diagnosis not present

## 2022-04-23 DIAGNOSIS — M6281 Muscle weakness (generalized): Secondary | ICD-10-CM | POA: Diagnosis not present

## 2022-04-23 DIAGNOSIS — N179 Acute kidney failure, unspecified: Secondary | ICD-10-CM | POA: Diagnosis not present

## 2022-04-23 DIAGNOSIS — F329 Major depressive disorder, single episode, unspecified: Secondary | ICD-10-CM | POA: Diagnosis not present

## 2022-04-23 DIAGNOSIS — I639 Cerebral infarction, unspecified: Secondary | ICD-10-CM | POA: Diagnosis not present

## 2022-04-23 DIAGNOSIS — D6832 Hemorrhagic disorder due to extrinsic circulating anticoagulants: Secondary | ICD-10-CM | POA: Diagnosis not present

## 2022-04-23 DIAGNOSIS — R278 Other lack of coordination: Secondary | ICD-10-CM | POA: Diagnosis not present

## 2022-04-23 DIAGNOSIS — K219 Gastro-esophageal reflux disease without esophagitis: Secondary | ICD-10-CM | POA: Diagnosis not present

## 2022-04-23 DIAGNOSIS — G9341 Metabolic encephalopathy: Secondary | ICD-10-CM | POA: Diagnosis not present

## 2022-04-23 DIAGNOSIS — R2681 Unsteadiness on feet: Secondary | ICD-10-CM | POA: Diagnosis not present

## 2022-04-23 DIAGNOSIS — D68312 Antiphospholipid antibody with hemorrhagic disorder: Secondary | ICD-10-CM | POA: Diagnosis not present

## 2022-04-24 DIAGNOSIS — R278 Other lack of coordination: Secondary | ICD-10-CM | POA: Diagnosis not present

## 2022-04-24 DIAGNOSIS — I639 Cerebral infarction, unspecified: Secondary | ICD-10-CM | POA: Diagnosis not present

## 2022-04-24 DIAGNOSIS — M6281 Muscle weakness (generalized): Secondary | ICD-10-CM | POA: Diagnosis not present

## 2022-04-25 DIAGNOSIS — R278 Other lack of coordination: Secondary | ICD-10-CM | POA: Diagnosis not present

## 2022-04-25 DIAGNOSIS — I639 Cerebral infarction, unspecified: Secondary | ICD-10-CM | POA: Diagnosis not present

## 2022-04-25 DIAGNOSIS — M6281 Muscle weakness (generalized): Secondary | ICD-10-CM | POA: Diagnosis not present

## 2022-04-26 DIAGNOSIS — E569 Vitamin deficiency, unspecified: Secondary | ICD-10-CM | POA: Diagnosis not present

## 2022-04-26 DIAGNOSIS — Z79899 Other long term (current) drug therapy: Secondary | ICD-10-CM | POA: Diagnosis not present

## 2022-04-26 DIAGNOSIS — M6281 Muscle weakness (generalized): Secondary | ICD-10-CM | POA: Diagnosis not present

## 2022-04-26 DIAGNOSIS — I639 Cerebral infarction, unspecified: Secondary | ICD-10-CM | POA: Diagnosis not present

## 2022-04-26 DIAGNOSIS — R278 Other lack of coordination: Secondary | ICD-10-CM | POA: Diagnosis not present

## 2022-04-27 DIAGNOSIS — R451 Restlessness and agitation: Secondary | ICD-10-CM | POA: Diagnosis not present

## 2022-04-27 DIAGNOSIS — R296 Repeated falls: Secondary | ICD-10-CM | POA: Diagnosis not present

## 2022-04-27 DIAGNOSIS — I639 Cerebral infarction, unspecified: Secondary | ICD-10-CM | POA: Diagnosis not present

## 2022-04-27 DIAGNOSIS — M6281 Muscle weakness (generalized): Secondary | ICD-10-CM | POA: Diagnosis not present

## 2022-04-27 DIAGNOSIS — I82401 Acute embolism and thrombosis of unspecified deep veins of right lower extremity: Secondary | ICD-10-CM | POA: Diagnosis not present

## 2022-04-27 DIAGNOSIS — R799 Abnormal finding of blood chemistry, unspecified: Secondary | ICD-10-CM | POA: Diagnosis not present

## 2022-04-27 DIAGNOSIS — R278 Other lack of coordination: Secondary | ICD-10-CM | POA: Diagnosis not present

## 2022-04-30 DIAGNOSIS — I639 Cerebral infarction, unspecified: Secondary | ICD-10-CM | POA: Diagnosis not present

## 2022-04-30 DIAGNOSIS — M6281 Muscle weakness (generalized): Secondary | ICD-10-CM | POA: Diagnosis not present

## 2022-04-30 DIAGNOSIS — R278 Other lack of coordination: Secondary | ICD-10-CM | POA: Diagnosis not present

## 2022-04-30 DIAGNOSIS — Z7901 Long term (current) use of anticoagulants: Secondary | ICD-10-CM | POA: Diagnosis not present

## 2022-04-30 DIAGNOSIS — E039 Hypothyroidism, unspecified: Secondary | ICD-10-CM | POA: Diagnosis not present

## 2022-05-01 DIAGNOSIS — K219 Gastro-esophageal reflux disease without esophagitis: Secondary | ICD-10-CM | POA: Diagnosis not present

## 2022-05-01 DIAGNOSIS — N179 Acute kidney failure, unspecified: Secondary | ICD-10-CM | POA: Diagnosis not present

## 2022-05-01 DIAGNOSIS — R2681 Unsteadiness on feet: Secondary | ICD-10-CM | POA: Diagnosis not present

## 2022-05-01 DIAGNOSIS — G9341 Metabolic encephalopathy: Secondary | ICD-10-CM | POA: Diagnosis not present

## 2022-05-01 DIAGNOSIS — D68312 Antiphospholipid antibody with hemorrhagic disorder: Secondary | ICD-10-CM | POA: Diagnosis not present

## 2022-05-01 DIAGNOSIS — I639 Cerebral infarction, unspecified: Secondary | ICD-10-CM | POA: Diagnosis not present

## 2022-05-01 DIAGNOSIS — M6281 Muscle weakness (generalized): Secondary | ICD-10-CM | POA: Diagnosis not present

## 2022-05-01 DIAGNOSIS — R278 Other lack of coordination: Secondary | ICD-10-CM | POA: Diagnosis not present

## 2022-05-01 DIAGNOSIS — F329 Major depressive disorder, single episode, unspecified: Secondary | ICD-10-CM | POA: Diagnosis not present

## 2022-05-01 DIAGNOSIS — M80061A Age-related osteoporosis with current pathological fracture, right lower leg, initial encounter for fracture: Secondary | ICD-10-CM | POA: Diagnosis not present

## 2022-05-02 DIAGNOSIS — F01C4 Vascular dementia, severe, with anxiety: Secondary | ICD-10-CM | POA: Diagnosis not present

## 2022-05-02 DIAGNOSIS — I69321 Dysphasia following cerebral infarction: Secondary | ICD-10-CM | POA: Diagnosis not present

## 2022-05-02 DIAGNOSIS — I69398 Other sequelae of cerebral infarction: Secondary | ICD-10-CM | POA: Diagnosis not present

## 2022-05-02 DIAGNOSIS — I82401 Acute embolism and thrombosis of unspecified deep veins of right lower extremity: Secondary | ICD-10-CM | POA: Diagnosis not present

## 2022-05-02 DIAGNOSIS — E039 Hypothyroidism, unspecified: Secondary | ICD-10-CM | POA: Diagnosis not present

## 2022-05-02 DIAGNOSIS — G4700 Insomnia, unspecified: Secondary | ICD-10-CM | POA: Diagnosis not present

## 2022-05-02 DIAGNOSIS — I639 Cerebral infarction, unspecified: Secondary | ICD-10-CM | POA: Diagnosis not present

## 2022-05-02 DIAGNOSIS — M6281 Muscle weakness (generalized): Secondary | ICD-10-CM | POA: Diagnosis not present

## 2022-05-02 DIAGNOSIS — F339 Major depressive disorder, recurrent, unspecified: Secondary | ICD-10-CM | POA: Diagnosis not present

## 2022-05-02 DIAGNOSIS — N200 Calculus of kidney: Secondary | ICD-10-CM | POA: Diagnosis not present

## 2022-05-02 DIAGNOSIS — R296 Repeated falls: Secondary | ICD-10-CM | POA: Diagnosis not present

## 2022-05-02 DIAGNOSIS — F01C11 Vascular dementia, severe, with agitation: Secondary | ICD-10-CM | POA: Diagnosis not present

## 2022-05-02 DIAGNOSIS — R278 Other lack of coordination: Secondary | ICD-10-CM | POA: Diagnosis not present

## 2022-05-02 DIAGNOSIS — N390 Urinary tract infection, site not specified: Secondary | ICD-10-CM | POA: Diagnosis not present

## 2022-05-02 DIAGNOSIS — R41841 Cognitive communication deficit: Secondary | ICD-10-CM | POA: Diagnosis not present

## 2022-05-03 DIAGNOSIS — R278 Other lack of coordination: Secondary | ICD-10-CM | POA: Diagnosis not present

## 2022-05-03 DIAGNOSIS — I639 Cerebral infarction, unspecified: Secondary | ICD-10-CM | POA: Diagnosis not present

## 2022-05-03 DIAGNOSIS — M6281 Muscle weakness (generalized): Secondary | ICD-10-CM | POA: Diagnosis not present

## 2022-05-03 DIAGNOSIS — Z7901 Long term (current) use of anticoagulants: Secondary | ICD-10-CM | POA: Diagnosis not present

## 2022-05-04 DIAGNOSIS — R296 Repeated falls: Secondary | ICD-10-CM | POA: Diagnosis not present

## 2022-05-04 DIAGNOSIS — I639 Cerebral infarction, unspecified: Secondary | ICD-10-CM | POA: Diagnosis not present

## 2022-05-04 DIAGNOSIS — I82401 Acute embolism and thrombosis of unspecified deep veins of right lower extremity: Secondary | ICD-10-CM | POA: Diagnosis not present

## 2022-05-04 DIAGNOSIS — R41841 Cognitive communication deficit: Secondary | ICD-10-CM | POA: Diagnosis not present

## 2022-05-04 DIAGNOSIS — M6281 Muscle weakness (generalized): Secondary | ICD-10-CM | POA: Diagnosis not present

## 2022-05-04 DIAGNOSIS — R278 Other lack of coordination: Secondary | ICD-10-CM | POA: Diagnosis not present

## 2022-05-04 DIAGNOSIS — N390 Urinary tract infection, site not specified: Secondary | ICD-10-CM | POA: Diagnosis not present

## 2022-05-07 DIAGNOSIS — I639 Cerebral infarction, unspecified: Secondary | ICD-10-CM | POA: Diagnosis not present

## 2022-05-07 DIAGNOSIS — M6281 Muscle weakness (generalized): Secondary | ICD-10-CM | POA: Diagnosis not present

## 2022-05-07 DIAGNOSIS — I69398 Other sequelae of cerebral infarction: Secondary | ICD-10-CM | POA: Diagnosis not present

## 2022-05-07 DIAGNOSIS — Z7901 Long term (current) use of anticoagulants: Secondary | ICD-10-CM | POA: Diagnosis not present

## 2022-05-07 DIAGNOSIS — F339 Major depressive disorder, recurrent, unspecified: Secondary | ICD-10-CM | POA: Diagnosis not present

## 2022-05-07 DIAGNOSIS — R278 Other lack of coordination: Secondary | ICD-10-CM | POA: Diagnosis not present

## 2022-05-08 DIAGNOSIS — R41841 Cognitive communication deficit: Secondary | ICD-10-CM | POA: Diagnosis not present

## 2022-05-08 DIAGNOSIS — R2681 Unsteadiness on feet: Secondary | ICD-10-CM | POA: Diagnosis not present

## 2022-05-08 DIAGNOSIS — N179 Acute kidney failure, unspecified: Secondary | ICD-10-CM | POA: Diagnosis not present

## 2022-05-08 DIAGNOSIS — E039 Hypothyroidism, unspecified: Secondary | ICD-10-CM | POA: Diagnosis not present

## 2022-05-08 DIAGNOSIS — M80061A Age-related osteoporosis with current pathological fracture, right lower leg, initial encounter for fracture: Secondary | ICD-10-CM | POA: Diagnosis not present

## 2022-05-08 DIAGNOSIS — F329 Major depressive disorder, single episode, unspecified: Secondary | ICD-10-CM | POA: Diagnosis not present

## 2022-05-08 DIAGNOSIS — I82401 Acute embolism and thrombosis of unspecified deep veins of right lower extremity: Secondary | ICD-10-CM | POA: Diagnosis not present

## 2022-05-08 DIAGNOSIS — I10 Essential (primary) hypertension: Secondary | ICD-10-CM | POA: Diagnosis not present

## 2022-05-08 DIAGNOSIS — M6281 Muscle weakness (generalized): Secondary | ICD-10-CM | POA: Diagnosis not present

## 2022-05-08 DIAGNOSIS — I639 Cerebral infarction, unspecified: Secondary | ICD-10-CM | POA: Diagnosis not present

## 2022-05-08 DIAGNOSIS — K219 Gastro-esophageal reflux disease without esophagitis: Secondary | ICD-10-CM | POA: Diagnosis not present

## 2022-05-08 DIAGNOSIS — D68312 Antiphospholipid antibody with hemorrhagic disorder: Secondary | ICD-10-CM | POA: Diagnosis not present

## 2022-05-08 DIAGNOSIS — G9341 Metabolic encephalopathy: Secondary | ICD-10-CM | POA: Diagnosis not present

## 2022-05-08 DIAGNOSIS — R278 Other lack of coordination: Secondary | ICD-10-CM | POA: Diagnosis not present

## 2022-05-09 DIAGNOSIS — M6281 Muscle weakness (generalized): Secondary | ICD-10-CM | POA: Diagnosis not present

## 2022-05-09 DIAGNOSIS — R278 Other lack of coordination: Secondary | ICD-10-CM | POA: Diagnosis not present

## 2022-05-09 DIAGNOSIS — I639 Cerebral infarction, unspecified: Secondary | ICD-10-CM | POA: Diagnosis not present

## 2022-05-10 DIAGNOSIS — I639 Cerebral infarction, unspecified: Secondary | ICD-10-CM | POA: Diagnosis not present

## 2022-05-10 DIAGNOSIS — Z7901 Long term (current) use of anticoagulants: Secondary | ICD-10-CM | POA: Diagnosis not present

## 2022-05-10 DIAGNOSIS — M6281 Muscle weakness (generalized): Secondary | ICD-10-CM | POA: Diagnosis not present

## 2022-05-10 DIAGNOSIS — R278 Other lack of coordination: Secondary | ICD-10-CM | POA: Diagnosis not present

## 2022-05-11 DIAGNOSIS — I639 Cerebral infarction, unspecified: Secondary | ICD-10-CM | POA: Diagnosis not present

## 2022-05-11 DIAGNOSIS — R41841 Cognitive communication deficit: Secondary | ICD-10-CM | POA: Diagnosis not present

## 2022-05-11 DIAGNOSIS — I10 Essential (primary) hypertension: Secondary | ICD-10-CM | POA: Diagnosis not present

## 2022-05-11 DIAGNOSIS — R278 Other lack of coordination: Secondary | ICD-10-CM | POA: Diagnosis not present

## 2022-05-11 DIAGNOSIS — I82401 Acute embolism and thrombosis of unspecified deep veins of right lower extremity: Secondary | ICD-10-CM | POA: Diagnosis not present

## 2022-05-11 DIAGNOSIS — M6281 Muscle weakness (generalized): Secondary | ICD-10-CM | POA: Diagnosis not present

## 2022-05-11 DIAGNOSIS — E039 Hypothyroidism, unspecified: Secondary | ICD-10-CM | POA: Diagnosis not present

## 2022-05-12 DIAGNOSIS — I639 Cerebral infarction, unspecified: Secondary | ICD-10-CM | POA: Diagnosis not present

## 2022-05-12 DIAGNOSIS — R278 Other lack of coordination: Secondary | ICD-10-CM | POA: Diagnosis not present

## 2022-05-12 DIAGNOSIS — M6281 Muscle weakness (generalized): Secondary | ICD-10-CM | POA: Diagnosis not present

## 2022-05-14 DIAGNOSIS — I639 Cerebral infarction, unspecified: Secondary | ICD-10-CM | POA: Diagnosis not present

## 2022-05-14 DIAGNOSIS — R278 Other lack of coordination: Secondary | ICD-10-CM | POA: Diagnosis not present

## 2022-05-14 DIAGNOSIS — R41841 Cognitive communication deficit: Secondary | ICD-10-CM | POA: Diagnosis not present

## 2022-05-14 DIAGNOSIS — R296 Repeated falls: Secondary | ICD-10-CM | POA: Diagnosis not present

## 2022-05-14 DIAGNOSIS — M6281 Muscle weakness (generalized): Secondary | ICD-10-CM | POA: Diagnosis not present

## 2022-05-14 DIAGNOSIS — Z7901 Long term (current) use of anticoagulants: Secondary | ICD-10-CM | POA: Diagnosis not present

## 2022-05-14 DIAGNOSIS — I10 Essential (primary) hypertension: Secondary | ICD-10-CM | POA: Diagnosis not present

## 2022-05-14 DIAGNOSIS — I82401 Acute embolism and thrombosis of unspecified deep veins of right lower extremity: Secondary | ICD-10-CM | POA: Diagnosis not present

## 2022-05-15 DIAGNOSIS — M80061A Age-related osteoporosis with current pathological fracture, right lower leg, initial encounter for fracture: Secondary | ICD-10-CM | POA: Diagnosis not present

## 2022-05-15 DIAGNOSIS — R531 Weakness: Secondary | ICD-10-CM | POA: Diagnosis not present

## 2022-05-15 DIAGNOSIS — R41841 Cognitive communication deficit: Secondary | ICD-10-CM | POA: Diagnosis not present

## 2022-05-15 DIAGNOSIS — I82401 Acute embolism and thrombosis of unspecified deep veins of right lower extremity: Secondary | ICD-10-CM | POA: Diagnosis not present

## 2022-05-15 DIAGNOSIS — K219 Gastro-esophageal reflux disease without esophagitis: Secondary | ICD-10-CM | POA: Diagnosis not present

## 2022-05-15 DIAGNOSIS — I10 Essential (primary) hypertension: Secondary | ICD-10-CM | POA: Diagnosis not present

## 2022-05-15 DIAGNOSIS — I639 Cerebral infarction, unspecified: Secondary | ICD-10-CM | POA: Diagnosis not present

## 2022-05-15 DIAGNOSIS — R296 Repeated falls: Secondary | ICD-10-CM | POA: Diagnosis not present

## 2022-05-15 DIAGNOSIS — F329 Major depressive disorder, single episode, unspecified: Secondary | ICD-10-CM | POA: Diagnosis not present

## 2022-05-15 DIAGNOSIS — M6281 Muscle weakness (generalized): Secondary | ICD-10-CM | POA: Diagnosis not present

## 2022-05-15 DIAGNOSIS — D68312 Antiphospholipid antibody with hemorrhagic disorder: Secondary | ICD-10-CM | POA: Diagnosis not present

## 2022-05-15 DIAGNOSIS — G9341 Metabolic encephalopathy: Secondary | ICD-10-CM | POA: Diagnosis not present

## 2022-05-15 DIAGNOSIS — R278 Other lack of coordination: Secondary | ICD-10-CM | POA: Diagnosis not present

## 2022-05-15 DIAGNOSIS — N179 Acute kidney failure, unspecified: Secondary | ICD-10-CM | POA: Diagnosis not present

## 2022-05-15 DIAGNOSIS — R2681 Unsteadiness on feet: Secondary | ICD-10-CM | POA: Diagnosis not present

## 2022-05-16 DIAGNOSIS — R296 Repeated falls: Secondary | ICD-10-CM | POA: Diagnosis not present

## 2022-05-16 DIAGNOSIS — R41841 Cognitive communication deficit: Secondary | ICD-10-CM | POA: Diagnosis not present

## 2022-05-16 DIAGNOSIS — I82401 Acute embolism and thrombosis of unspecified deep veins of right lower extremity: Secondary | ICD-10-CM | POA: Diagnosis not present

## 2022-05-16 DIAGNOSIS — I639 Cerebral infarction, unspecified: Secondary | ICD-10-CM | POA: Diagnosis not present

## 2022-05-16 DIAGNOSIS — I69398 Other sequelae of cerebral infarction: Secondary | ICD-10-CM | POA: Diagnosis not present

## 2022-05-16 DIAGNOSIS — I69321 Dysphasia following cerebral infarction: Secondary | ICD-10-CM | POA: Diagnosis not present

## 2022-05-16 DIAGNOSIS — R278 Other lack of coordination: Secondary | ICD-10-CM | POA: Diagnosis not present

## 2022-05-16 DIAGNOSIS — D72819 Decreased white blood cell count, unspecified: Secondary | ICD-10-CM | POA: Diagnosis not present

## 2022-05-16 DIAGNOSIS — F01C4 Vascular dementia, severe, with anxiety: Secondary | ICD-10-CM | POA: Diagnosis not present

## 2022-05-16 DIAGNOSIS — M6281 Muscle weakness (generalized): Secondary | ICD-10-CM | POA: Diagnosis not present

## 2022-05-16 DIAGNOSIS — F339 Major depressive disorder, recurrent, unspecified: Secondary | ICD-10-CM | POA: Diagnosis not present

## 2022-05-16 DIAGNOSIS — G4700 Insomnia, unspecified: Secondary | ICD-10-CM | POA: Diagnosis not present

## 2022-05-16 DIAGNOSIS — F01C11 Vascular dementia, severe, with agitation: Secondary | ICD-10-CM | POA: Diagnosis not present

## 2022-05-17 DIAGNOSIS — M6281 Muscle weakness (generalized): Secondary | ICD-10-CM | POA: Diagnosis not present

## 2022-05-17 DIAGNOSIS — I639 Cerebral infarction, unspecified: Secondary | ICD-10-CM | POA: Diagnosis not present

## 2022-05-17 DIAGNOSIS — R278 Other lack of coordination: Secondary | ICD-10-CM | POA: Diagnosis not present

## 2022-05-17 DIAGNOSIS — Z7901 Long term (current) use of anticoagulants: Secondary | ICD-10-CM | POA: Diagnosis not present

## 2022-05-18 DIAGNOSIS — I639 Cerebral infarction, unspecified: Secondary | ICD-10-CM | POA: Diagnosis not present

## 2022-05-18 DIAGNOSIS — D72819 Decreased white blood cell count, unspecified: Secondary | ICD-10-CM | POA: Diagnosis not present

## 2022-05-18 DIAGNOSIS — R41841 Cognitive communication deficit: Secondary | ICD-10-CM | POA: Diagnosis not present

## 2022-05-18 DIAGNOSIS — I10 Essential (primary) hypertension: Secondary | ICD-10-CM | POA: Diagnosis not present

## 2022-05-18 DIAGNOSIS — R278 Other lack of coordination: Secondary | ICD-10-CM | POA: Diagnosis not present

## 2022-05-18 DIAGNOSIS — I82401 Acute embolism and thrombosis of unspecified deep veins of right lower extremity: Secondary | ICD-10-CM | POA: Diagnosis not present

## 2022-05-18 DIAGNOSIS — M6281 Muscle weakness (generalized): Secondary | ICD-10-CM | POA: Diagnosis not present

## 2022-05-21 DIAGNOSIS — N390 Urinary tract infection, site not specified: Secondary | ICD-10-CM | POA: Diagnosis not present

## 2022-05-21 DIAGNOSIS — Z7901 Long term (current) use of anticoagulants: Secondary | ICD-10-CM | POA: Diagnosis not present

## 2022-05-21 DIAGNOSIS — M6281 Muscle weakness (generalized): Secondary | ICD-10-CM | POA: Diagnosis not present

## 2022-05-21 DIAGNOSIS — R41841 Cognitive communication deficit: Secondary | ICD-10-CM | POA: Diagnosis not present

## 2022-05-21 DIAGNOSIS — I639 Cerebral infarction, unspecified: Secondary | ICD-10-CM | POA: Diagnosis not present

## 2022-05-21 DIAGNOSIS — R278 Other lack of coordination: Secondary | ICD-10-CM | POA: Diagnosis not present

## 2022-05-21 DIAGNOSIS — I10 Essential (primary) hypertension: Secondary | ICD-10-CM | POA: Diagnosis not present

## 2022-05-21 DIAGNOSIS — I82401 Acute embolism and thrombosis of unspecified deep veins of right lower extremity: Secondary | ICD-10-CM | POA: Diagnosis not present

## 2022-05-22 DIAGNOSIS — I10 Essential (primary) hypertension: Secondary | ICD-10-CM | POA: Diagnosis not present

## 2022-05-22 DIAGNOSIS — R41841 Cognitive communication deficit: Secondary | ICD-10-CM | POA: Diagnosis not present

## 2022-05-22 DIAGNOSIS — R531 Weakness: Secondary | ICD-10-CM | POA: Diagnosis not present

## 2022-05-22 DIAGNOSIS — I82401 Acute embolism and thrombosis of unspecified deep veins of right lower extremity: Secondary | ICD-10-CM | POA: Diagnosis not present

## 2022-05-23 DIAGNOSIS — D72819 Decreased white blood cell count, unspecified: Secondary | ICD-10-CM | POA: Diagnosis not present

## 2022-05-23 DIAGNOSIS — N390 Urinary tract infection, site not specified: Secondary | ICD-10-CM | POA: Diagnosis not present

## 2022-05-23 DIAGNOSIS — R296 Repeated falls: Secondary | ICD-10-CM | POA: Diagnosis not present

## 2022-05-23 DIAGNOSIS — I82401 Acute embolism and thrombosis of unspecified deep veins of right lower extremity: Secondary | ICD-10-CM | POA: Diagnosis not present

## 2022-05-24 DIAGNOSIS — R296 Repeated falls: Secondary | ICD-10-CM | POA: Diagnosis not present

## 2022-05-24 DIAGNOSIS — I82401 Acute embolism and thrombosis of unspecified deep veins of right lower extremity: Secondary | ICD-10-CM | POA: Diagnosis not present

## 2022-05-24 DIAGNOSIS — N390 Urinary tract infection, site not specified: Secondary | ICD-10-CM | POA: Diagnosis not present

## 2022-05-25 DIAGNOSIS — N390 Urinary tract infection, site not specified: Secondary | ICD-10-CM | POA: Diagnosis not present

## 2022-05-25 DIAGNOSIS — I82401 Acute embolism and thrombosis of unspecified deep veins of right lower extremity: Secondary | ICD-10-CM | POA: Diagnosis not present

## 2022-05-25 DIAGNOSIS — R41841 Cognitive communication deficit: Secondary | ICD-10-CM | POA: Diagnosis not present

## 2022-05-25 DIAGNOSIS — Z7901 Long term (current) use of anticoagulants: Secondary | ICD-10-CM | POA: Diagnosis not present

## 2022-05-27 DIAGNOSIS — D689 Coagulation defect, unspecified: Secondary | ICD-10-CM | POA: Diagnosis not present

## 2022-05-28 DIAGNOSIS — Z7901 Long term (current) use of anticoagulants: Secondary | ICD-10-CM | POA: Diagnosis not present

## 2022-05-28 DIAGNOSIS — I69321 Dysphasia following cerebral infarction: Secondary | ICD-10-CM | POA: Diagnosis not present

## 2022-05-28 DIAGNOSIS — F339 Major depressive disorder, recurrent, unspecified: Secondary | ICD-10-CM | POA: Diagnosis not present

## 2022-05-28 DIAGNOSIS — I82401 Acute embolism and thrombosis of unspecified deep veins of right lower extremity: Secondary | ICD-10-CM | POA: Diagnosis not present

## 2022-05-28 DIAGNOSIS — R41841 Cognitive communication deficit: Secondary | ICD-10-CM | POA: Diagnosis not present

## 2022-05-28 DIAGNOSIS — N390 Urinary tract infection, site not specified: Secondary | ICD-10-CM | POA: Diagnosis not present

## 2022-05-28 DIAGNOSIS — I69398 Other sequelae of cerebral infarction: Secondary | ICD-10-CM | POA: Diagnosis not present

## 2022-05-29 DIAGNOSIS — N390 Urinary tract infection, site not specified: Secondary | ICD-10-CM | POA: Diagnosis not present

## 2022-05-29 DIAGNOSIS — I10 Essential (primary) hypertension: Secondary | ICD-10-CM | POA: Diagnosis not present

## 2022-05-29 DIAGNOSIS — I82401 Acute embolism and thrombosis of unspecified deep veins of right lower extremity: Secondary | ICD-10-CM | POA: Diagnosis not present

## 2022-05-29 DIAGNOSIS — R41841 Cognitive communication deficit: Secondary | ICD-10-CM | POA: Diagnosis not present

## 2022-05-31 DIAGNOSIS — Z7901 Long term (current) use of anticoagulants: Secondary | ICD-10-CM | POA: Diagnosis not present

## 2022-06-01 DIAGNOSIS — R41841 Cognitive communication deficit: Secondary | ICD-10-CM | POA: Diagnosis not present

## 2022-06-01 DIAGNOSIS — I82401 Acute embolism and thrombosis of unspecified deep veins of right lower extremity: Secondary | ICD-10-CM | POA: Diagnosis not present

## 2022-06-01 DIAGNOSIS — N390 Urinary tract infection, site not specified: Secondary | ICD-10-CM | POA: Diagnosis not present

## 2022-06-01 DIAGNOSIS — I10 Essential (primary) hypertension: Secondary | ICD-10-CM | POA: Diagnosis not present

## 2022-06-05 DIAGNOSIS — Z7901 Long term (current) use of anticoagulants: Secondary | ICD-10-CM | POA: Diagnosis not present

## 2022-06-06 DIAGNOSIS — I10 Essential (primary) hypertension: Secondary | ICD-10-CM | POA: Diagnosis not present

## 2022-06-06 DIAGNOSIS — R41841 Cognitive communication deficit: Secondary | ICD-10-CM | POA: Diagnosis not present

## 2022-06-06 DIAGNOSIS — I82401 Acute embolism and thrombosis of unspecified deep veins of right lower extremity: Secondary | ICD-10-CM | POA: Diagnosis not present

## 2022-06-07 DIAGNOSIS — Z7901 Long term (current) use of anticoagulants: Secondary | ICD-10-CM | POA: Diagnosis not present

## 2022-06-07 DIAGNOSIS — I10 Essential (primary) hypertension: Secondary | ICD-10-CM | POA: Diagnosis not present

## 2022-06-07 DIAGNOSIS — I82401 Acute embolism and thrombosis of unspecified deep veins of right lower extremity: Secondary | ICD-10-CM | POA: Diagnosis not present

## 2022-06-07 DIAGNOSIS — R41841 Cognitive communication deficit: Secondary | ICD-10-CM | POA: Diagnosis not present

## 2022-06-08 DIAGNOSIS — R41841 Cognitive communication deficit: Secondary | ICD-10-CM | POA: Diagnosis not present

## 2022-06-08 DIAGNOSIS — I82401 Acute embolism and thrombosis of unspecified deep veins of right lower extremity: Secondary | ICD-10-CM | POA: Diagnosis not present

## 2022-06-08 DIAGNOSIS — I10 Essential (primary) hypertension: Secondary | ICD-10-CM | POA: Diagnosis not present

## 2022-06-08 DIAGNOSIS — E039 Hypothyroidism, unspecified: Secondary | ICD-10-CM | POA: Diagnosis not present

## 2022-06-11 DIAGNOSIS — Z7901 Long term (current) use of anticoagulants: Secondary | ICD-10-CM | POA: Diagnosis not present

## 2022-06-12 DIAGNOSIS — E039 Hypothyroidism, unspecified: Secondary | ICD-10-CM | POA: Diagnosis not present

## 2022-06-12 DIAGNOSIS — R41841 Cognitive communication deficit: Secondary | ICD-10-CM | POA: Diagnosis not present

## 2022-06-12 DIAGNOSIS — I6350 Cerebral infarction due to unspecified occlusion or stenosis of unspecified cerebral artery: Secondary | ICD-10-CM | POA: Diagnosis not present

## 2022-06-12 DIAGNOSIS — I82401 Acute embolism and thrombosis of unspecified deep veins of right lower extremity: Secondary | ICD-10-CM | POA: Diagnosis not present

## 2022-06-14 DIAGNOSIS — R52 Pain, unspecified: Secondary | ICD-10-CM | POA: Diagnosis not present

## 2022-06-14 DIAGNOSIS — I82401 Acute embolism and thrombosis of unspecified deep veins of right lower extremity: Secondary | ICD-10-CM | POA: Diagnosis not present

## 2022-06-14 DIAGNOSIS — E785 Hyperlipidemia, unspecified: Secondary | ICD-10-CM | POA: Diagnosis not present

## 2022-06-14 DIAGNOSIS — I6350 Cerebral infarction due to unspecified occlusion or stenosis of unspecified cerebral artery: Secondary | ICD-10-CM | POA: Diagnosis not present

## 2022-06-14 DIAGNOSIS — R41841 Cognitive communication deficit: Secondary | ICD-10-CM | POA: Diagnosis not present

## 2022-06-14 DIAGNOSIS — Z7901 Long term (current) use of anticoagulants: Secondary | ICD-10-CM | POA: Diagnosis not present

## 2022-06-15 DIAGNOSIS — E039 Hypothyroidism, unspecified: Secondary | ICD-10-CM | POA: Diagnosis not present

## 2022-06-15 DIAGNOSIS — E785 Hyperlipidemia, unspecified: Secondary | ICD-10-CM | POA: Diagnosis not present

## 2022-06-15 DIAGNOSIS — R41841 Cognitive communication deficit: Secondary | ICD-10-CM | POA: Diagnosis not present

## 2022-06-15 DIAGNOSIS — I82401 Acute embolism and thrombosis of unspecified deep veins of right lower extremity: Secondary | ICD-10-CM | POA: Diagnosis not present

## 2022-06-18 DIAGNOSIS — Z7901 Long term (current) use of anticoagulants: Secondary | ICD-10-CM | POA: Diagnosis not present

## 2022-06-19 DIAGNOSIS — I82401 Acute embolism and thrombosis of unspecified deep veins of right lower extremity: Secondary | ICD-10-CM | POA: Diagnosis not present

## 2022-06-19 DIAGNOSIS — R41841 Cognitive communication deficit: Secondary | ICD-10-CM | POA: Diagnosis not present

## 2022-06-19 DIAGNOSIS — I69351 Hemiplegia and hemiparesis following cerebral infarction affecting right dominant side: Secondary | ICD-10-CM | POA: Diagnosis not present

## 2022-06-19 DIAGNOSIS — E785 Hyperlipidemia, unspecified: Secondary | ICD-10-CM | POA: Diagnosis not present

## 2022-06-20 DIAGNOSIS — F419 Anxiety disorder, unspecified: Secondary | ICD-10-CM | POA: Diagnosis not present

## 2022-06-20 DIAGNOSIS — I69398 Other sequelae of cerebral infarction: Secondary | ICD-10-CM | POA: Diagnosis not present

## 2022-06-20 DIAGNOSIS — F331 Major depressive disorder, recurrent, moderate: Secondary | ICD-10-CM | POA: Diagnosis not present

## 2022-06-20 DIAGNOSIS — G4700 Insomnia, unspecified: Secondary | ICD-10-CM | POA: Diagnosis not present

## 2022-06-20 DIAGNOSIS — F01C11 Vascular dementia, severe, with agitation: Secondary | ICD-10-CM | POA: Diagnosis not present

## 2022-06-20 DIAGNOSIS — F01C4 Vascular dementia, severe, with anxiety: Secondary | ICD-10-CM | POA: Diagnosis not present

## 2022-06-21 DIAGNOSIS — Z7901 Long term (current) use of anticoagulants: Secondary | ICD-10-CM | POA: Diagnosis not present

## 2022-06-22 DIAGNOSIS — N183 Chronic kidney disease, stage 3 unspecified: Secondary | ICD-10-CM | POA: Diagnosis not present

## 2022-06-22 DIAGNOSIS — R770 Abnormality of albumin: Secondary | ICD-10-CM | POA: Diagnosis not present

## 2022-06-22 DIAGNOSIS — I69351 Hemiplegia and hemiparesis following cerebral infarction affecting right dominant side: Secondary | ICD-10-CM | POA: Diagnosis not present

## 2022-06-22 DIAGNOSIS — I82401 Acute embolism and thrombosis of unspecified deep veins of right lower extremity: Secondary | ICD-10-CM | POA: Diagnosis not present

## 2022-06-25 DIAGNOSIS — Z7901 Long term (current) use of anticoagulants: Secondary | ICD-10-CM | POA: Diagnosis not present

## 2022-06-26 DIAGNOSIS — R41841 Cognitive communication deficit: Secondary | ICD-10-CM | POA: Diagnosis not present

## 2022-06-26 DIAGNOSIS — I82401 Acute embolism and thrombosis of unspecified deep veins of right lower extremity: Secondary | ICD-10-CM | POA: Diagnosis not present

## 2022-06-26 DIAGNOSIS — I69351 Hemiplegia and hemiparesis following cerebral infarction affecting right dominant side: Secondary | ICD-10-CM | POA: Diagnosis not present

## 2022-06-27 DIAGNOSIS — I82401 Acute embolism and thrombosis of unspecified deep veins of right lower extremity: Secondary | ICD-10-CM | POA: Diagnosis not present

## 2022-06-27 DIAGNOSIS — R41841 Cognitive communication deficit: Secondary | ICD-10-CM | POA: Diagnosis not present

## 2022-06-27 DIAGNOSIS — R296 Repeated falls: Secondary | ICD-10-CM | POA: Diagnosis not present

## 2022-06-29 DIAGNOSIS — R296 Repeated falls: Secondary | ICD-10-CM | POA: Diagnosis not present

## 2022-06-29 DIAGNOSIS — R41841 Cognitive communication deficit: Secondary | ICD-10-CM | POA: Diagnosis not present

## 2022-06-29 DIAGNOSIS — I82401 Acute embolism and thrombosis of unspecified deep veins of right lower extremity: Secondary | ICD-10-CM | POA: Diagnosis not present

## 2022-06-30 DIAGNOSIS — L89152 Pressure ulcer of sacral region, stage 2: Secondary | ICD-10-CM | POA: Diagnosis not present

## 2022-06-30 DIAGNOSIS — I639 Cerebral infarction, unspecified: Secondary | ICD-10-CM | POA: Diagnosis not present

## 2022-06-30 DIAGNOSIS — I63422 Cerebral infarction due to embolism of left anterior cerebral artery: Secondary | ICD-10-CM | POA: Diagnosis not present

## 2022-06-30 DIAGNOSIS — I69320 Aphasia following cerebral infarction: Secondary | ICD-10-CM | POA: Diagnosis not present

## 2022-07-02 DIAGNOSIS — Z7901 Long term (current) use of anticoagulants: Secondary | ICD-10-CM | POA: Diagnosis not present

## 2022-07-02 DIAGNOSIS — I82401 Acute embolism and thrombosis of unspecified deep veins of right lower extremity: Secondary | ICD-10-CM | POA: Diagnosis not present

## 2022-07-02 DIAGNOSIS — L89152 Pressure ulcer of sacral region, stage 2: Secondary | ICD-10-CM | POA: Diagnosis not present

## 2022-07-02 DIAGNOSIS — R41841 Cognitive communication deficit: Secondary | ICD-10-CM | POA: Diagnosis not present

## 2022-07-03 DIAGNOSIS — I82401 Acute embolism and thrombosis of unspecified deep veins of right lower extremity: Secondary | ICD-10-CM | POA: Diagnosis not present

## 2022-07-03 DIAGNOSIS — I69351 Hemiplegia and hemiparesis following cerebral infarction affecting right dominant side: Secondary | ICD-10-CM | POA: Diagnosis not present

## 2022-07-03 DIAGNOSIS — L89152 Pressure ulcer of sacral region, stage 2: Secondary | ICD-10-CM | POA: Diagnosis not present

## 2022-07-03 DIAGNOSIS — R41841 Cognitive communication deficit: Secondary | ICD-10-CM | POA: Diagnosis not present

## 2022-07-04 DIAGNOSIS — I63422 Cerebral infarction due to embolism of left anterior cerebral artery: Secondary | ICD-10-CM | POA: Diagnosis not present

## 2022-07-04 DIAGNOSIS — F331 Major depressive disorder, recurrent, moderate: Secondary | ICD-10-CM | POA: Diagnosis not present

## 2022-07-04 DIAGNOSIS — L89152 Pressure ulcer of sacral region, stage 2: Secondary | ICD-10-CM | POA: Diagnosis not present

## 2022-07-04 DIAGNOSIS — I69398 Other sequelae of cerebral infarction: Secondary | ICD-10-CM | POA: Diagnosis not present

## 2022-07-04 DIAGNOSIS — F01C4 Vascular dementia, severe, with anxiety: Secondary | ICD-10-CM | POA: Diagnosis not present

## 2022-07-04 DIAGNOSIS — F01C11 Vascular dementia, severe, with agitation: Secondary | ICD-10-CM | POA: Diagnosis not present

## 2022-07-04 DIAGNOSIS — I69320 Aphasia following cerebral infarction: Secondary | ICD-10-CM | POA: Diagnosis not present

## 2022-07-04 DIAGNOSIS — I639 Cerebral infarction, unspecified: Secondary | ICD-10-CM | POA: Diagnosis not present

## 2022-07-05 DIAGNOSIS — Z7901 Long term (current) use of anticoagulants: Secondary | ICD-10-CM | POA: Diagnosis not present

## 2022-07-06 DIAGNOSIS — I82401 Acute embolism and thrombosis of unspecified deep veins of right lower extremity: Secondary | ICD-10-CM | POA: Diagnosis not present

## 2022-07-06 DIAGNOSIS — L89152 Pressure ulcer of sacral region, stage 2: Secondary | ICD-10-CM | POA: Diagnosis not present

## 2022-07-06 DIAGNOSIS — G8929 Other chronic pain: Secondary | ICD-10-CM | POA: Diagnosis not present

## 2022-07-06 DIAGNOSIS — R41841 Cognitive communication deficit: Secondary | ICD-10-CM | POA: Diagnosis not present

## 2022-07-09 DIAGNOSIS — Z7901 Long term (current) use of anticoagulants: Secondary | ICD-10-CM | POA: Diagnosis not present

## 2022-07-10 DIAGNOSIS — L89152 Pressure ulcer of sacral region, stage 2: Secondary | ICD-10-CM | POA: Diagnosis not present

## 2022-07-10 DIAGNOSIS — I69320 Aphasia following cerebral infarction: Secondary | ICD-10-CM | POA: Diagnosis not present

## 2022-07-10 DIAGNOSIS — R41841 Cognitive communication deficit: Secondary | ICD-10-CM | POA: Diagnosis not present

## 2022-07-10 DIAGNOSIS — I82401 Acute embolism and thrombosis of unspecified deep veins of right lower extremity: Secondary | ICD-10-CM | POA: Diagnosis not present

## 2022-07-10 DIAGNOSIS — I639 Cerebral infarction, unspecified: Secondary | ICD-10-CM | POA: Diagnosis not present

## 2022-07-10 DIAGNOSIS — I63422 Cerebral infarction due to embolism of left anterior cerebral artery: Secondary | ICD-10-CM | POA: Diagnosis not present

## 2022-07-11 DIAGNOSIS — L89152 Pressure ulcer of sacral region, stage 2: Secondary | ICD-10-CM | POA: Diagnosis not present

## 2022-07-11 DIAGNOSIS — Z87442 Personal history of urinary calculi: Secondary | ICD-10-CM | POA: Diagnosis not present

## 2022-07-11 DIAGNOSIS — Z1159 Encounter for screening for other viral diseases: Secondary | ICD-10-CM | POA: Diagnosis not present

## 2022-07-11 DIAGNOSIS — D72819 Decreased white blood cell count, unspecified: Secondary | ICD-10-CM | POA: Diagnosis not present

## 2022-07-11 DIAGNOSIS — Z9049 Acquired absence of other specified parts of digestive tract: Secondary | ICD-10-CM | POA: Diagnosis not present

## 2022-07-11 DIAGNOSIS — D649 Anemia, unspecified: Secondary | ICD-10-CM | POA: Diagnosis not present

## 2022-07-11 DIAGNOSIS — Z7901 Long term (current) use of anticoagulants: Secondary | ICD-10-CM | POA: Diagnosis not present

## 2022-07-11 DIAGNOSIS — Z79899 Other long term (current) drug therapy: Secondary | ICD-10-CM | POA: Diagnosis not present

## 2022-07-11 DIAGNOSIS — R41841 Cognitive communication deficit: Secondary | ICD-10-CM | POA: Diagnosis not present

## 2022-07-11 DIAGNOSIS — N189 Chronic kidney disease, unspecified: Secondary | ICD-10-CM | POA: Diagnosis not present

## 2022-07-11 DIAGNOSIS — I82401 Acute embolism and thrombosis of unspecified deep veins of right lower extremity: Secondary | ICD-10-CM | POA: Diagnosis not present

## 2022-07-11 DIAGNOSIS — Z8673 Personal history of transient ischemic attack (TIA), and cerebral infarction without residual deficits: Secondary | ICD-10-CM | POA: Diagnosis not present

## 2022-07-11 DIAGNOSIS — Z79890 Hormone replacement therapy (postmenopausal): Secondary | ICD-10-CM | POA: Diagnosis not present

## 2022-07-11 DIAGNOSIS — Z114 Encounter for screening for human immunodeficiency virus [HIV]: Secondary | ICD-10-CM | POA: Diagnosis not present

## 2022-07-11 DIAGNOSIS — R296 Repeated falls: Secondary | ICD-10-CM | POA: Diagnosis not present

## 2022-07-12 DIAGNOSIS — D72819 Decreased white blood cell count, unspecified: Secondary | ICD-10-CM | POA: Diagnosis not present

## 2022-07-12 DIAGNOSIS — I10 Essential (primary) hypertension: Secondary | ICD-10-CM | POA: Diagnosis not present

## 2022-07-12 DIAGNOSIS — Z7901 Long term (current) use of anticoagulants: Secondary | ICD-10-CM | POA: Diagnosis not present

## 2022-07-12 DIAGNOSIS — R296 Repeated falls: Secondary | ICD-10-CM | POA: Diagnosis not present

## 2022-07-13 DIAGNOSIS — R296 Repeated falls: Secondary | ICD-10-CM | POA: Diagnosis not present

## 2022-07-13 DIAGNOSIS — I10 Essential (primary) hypertension: Secondary | ICD-10-CM | POA: Diagnosis not present

## 2022-07-13 DIAGNOSIS — I82401 Acute embolism and thrombosis of unspecified deep veins of right lower extremity: Secondary | ICD-10-CM | POA: Diagnosis not present

## 2022-07-16 DIAGNOSIS — I69398 Other sequelae of cerebral infarction: Secondary | ICD-10-CM | POA: Diagnosis not present

## 2022-07-16 DIAGNOSIS — I82401 Acute embolism and thrombosis of unspecified deep veins of right lower extremity: Secondary | ICD-10-CM | POA: Diagnosis not present

## 2022-07-16 DIAGNOSIS — I63422 Cerebral infarction due to embolism of left anterior cerebral artery: Secondary | ICD-10-CM | POA: Diagnosis not present

## 2022-07-16 DIAGNOSIS — F331 Major depressive disorder, recurrent, moderate: Secondary | ICD-10-CM | POA: Diagnosis not present

## 2022-07-16 DIAGNOSIS — I10 Essential (primary) hypertension: Secondary | ICD-10-CM | POA: Diagnosis not present

## 2022-07-16 DIAGNOSIS — I639 Cerebral infarction, unspecified: Secondary | ICD-10-CM | POA: Diagnosis not present

## 2022-07-16 DIAGNOSIS — R296 Repeated falls: Secondary | ICD-10-CM | POA: Diagnosis not present

## 2022-07-16 DIAGNOSIS — S90414A Abrasion, right lesser toe(s), initial encounter: Secondary | ICD-10-CM | POA: Diagnosis not present

## 2022-07-16 DIAGNOSIS — L89152 Pressure ulcer of sacral region, stage 2: Secondary | ICD-10-CM | POA: Diagnosis not present

## 2022-07-19 DIAGNOSIS — Z7901 Long term (current) use of anticoagulants: Secondary | ICD-10-CM | POA: Diagnosis not present

## 2022-07-20 DIAGNOSIS — L89152 Pressure ulcer of sacral region, stage 2: Secondary | ICD-10-CM | POA: Diagnosis not present

## 2022-07-20 DIAGNOSIS — I10 Essential (primary) hypertension: Secondary | ICD-10-CM | POA: Diagnosis not present

## 2022-07-20 DIAGNOSIS — I82401 Acute embolism and thrombosis of unspecified deep veins of right lower extremity: Secondary | ICD-10-CM | POA: Diagnosis not present

## 2022-07-20 DIAGNOSIS — R41841 Cognitive communication deficit: Secondary | ICD-10-CM | POA: Diagnosis not present

## 2022-07-21 DIAGNOSIS — I82401 Acute embolism and thrombosis of unspecified deep veins of right lower extremity: Secondary | ICD-10-CM | POA: Diagnosis not present

## 2022-07-21 DIAGNOSIS — I10 Essential (primary) hypertension: Secondary | ICD-10-CM | POA: Diagnosis not present

## 2022-07-21 DIAGNOSIS — Z7901 Long term (current) use of anticoagulants: Secondary | ICD-10-CM | POA: Diagnosis not present

## 2022-07-21 DIAGNOSIS — R41841 Cognitive communication deficit: Secondary | ICD-10-CM | POA: Diagnosis not present

## 2022-07-23 DIAGNOSIS — Z7901 Long term (current) use of anticoagulants: Secondary | ICD-10-CM | POA: Diagnosis not present

## 2022-07-24 DIAGNOSIS — I10 Essential (primary) hypertension: Secondary | ICD-10-CM | POA: Diagnosis not present

## 2022-07-24 DIAGNOSIS — I82401 Acute embolism and thrombosis of unspecified deep veins of right lower extremity: Secondary | ICD-10-CM | POA: Diagnosis not present

## 2022-07-24 DIAGNOSIS — R41841 Cognitive communication deficit: Secondary | ICD-10-CM | POA: Diagnosis not present

## 2022-07-24 DIAGNOSIS — L89152 Pressure ulcer of sacral region, stage 2: Secondary | ICD-10-CM | POA: Diagnosis not present

## 2022-07-26 DIAGNOSIS — I10 Essential (primary) hypertension: Secondary | ICD-10-CM | POA: Diagnosis not present

## 2022-07-26 DIAGNOSIS — I82401 Acute embolism and thrombosis of unspecified deep veins of right lower extremity: Secondary | ICD-10-CM | POA: Diagnosis not present

## 2022-07-26 DIAGNOSIS — R41841 Cognitive communication deficit: Secondary | ICD-10-CM | POA: Diagnosis not present

## 2022-07-27 DIAGNOSIS — S90414A Abrasion, right lesser toe(s), initial encounter: Secondary | ICD-10-CM | POA: Diagnosis not present

## 2022-07-27 DIAGNOSIS — R41841 Cognitive communication deficit: Secondary | ICD-10-CM | POA: Diagnosis not present

## 2022-07-27 DIAGNOSIS — I82401 Acute embolism and thrombosis of unspecified deep veins of right lower extremity: Secondary | ICD-10-CM | POA: Diagnosis not present

## 2022-07-27 DIAGNOSIS — L89152 Pressure ulcer of sacral region, stage 2: Secondary | ICD-10-CM | POA: Diagnosis not present

## 2022-07-27 DIAGNOSIS — I63422 Cerebral infarction due to embolism of left anterior cerebral artery: Secondary | ICD-10-CM | POA: Diagnosis not present

## 2022-07-27 DIAGNOSIS — I10 Essential (primary) hypertension: Secondary | ICD-10-CM | POA: Diagnosis not present

## 2022-07-27 DIAGNOSIS — I639 Cerebral infarction, unspecified: Secondary | ICD-10-CM | POA: Diagnosis not present

## 2022-07-28 DIAGNOSIS — R41841 Cognitive communication deficit: Secondary | ICD-10-CM | POA: Diagnosis not present

## 2022-07-28 DIAGNOSIS — I69320 Aphasia following cerebral infarction: Secondary | ICD-10-CM | POA: Diagnosis not present

## 2022-07-28 DIAGNOSIS — R4701 Aphasia: Secondary | ICD-10-CM | POA: Diagnosis not present

## 2022-07-28 DIAGNOSIS — F01C11 Vascular dementia, severe, with agitation: Secondary | ICD-10-CM | POA: Diagnosis not present

## 2022-07-28 DIAGNOSIS — I69351 Hemiplegia and hemiparesis following cerebral infarction affecting right dominant side: Secondary | ICD-10-CM | POA: Diagnosis not present

## 2022-07-28 DIAGNOSIS — I63422 Cerebral infarction due to embolism of left anterior cerebral artery: Secondary | ICD-10-CM | POA: Diagnosis not present

## 2022-07-29 DIAGNOSIS — R531 Weakness: Secondary | ICD-10-CM | POA: Diagnosis not present

## 2022-07-29 DIAGNOSIS — U071 COVID-19: Secondary | ICD-10-CM | POA: Diagnosis not present

## 2022-07-29 DIAGNOSIS — R051 Acute cough: Secondary | ICD-10-CM | POA: Diagnosis not present

## 2022-07-30 DIAGNOSIS — R41841 Cognitive communication deficit: Secondary | ICD-10-CM | POA: Diagnosis not present

## 2022-07-30 DIAGNOSIS — I6350 Cerebral infarction due to unspecified occlusion or stenosis of unspecified cerebral artery: Secondary | ICD-10-CM | POA: Diagnosis not present

## 2022-07-30 DIAGNOSIS — I69351 Hemiplegia and hemiparesis following cerebral infarction affecting right dominant side: Secondary | ICD-10-CM | POA: Diagnosis not present

## 2022-07-30 DIAGNOSIS — F01C11 Vascular dementia, severe, with agitation: Secondary | ICD-10-CM | POA: Diagnosis not present

## 2022-07-30 DIAGNOSIS — R4701 Aphasia: Secondary | ICD-10-CM | POA: Diagnosis not present

## 2022-07-30 DIAGNOSIS — I69320 Aphasia following cerebral infarction: Secondary | ICD-10-CM | POA: Diagnosis not present

## 2022-07-30 DIAGNOSIS — E039 Hypothyroidism, unspecified: Secondary | ICD-10-CM | POA: Diagnosis not present

## 2022-07-30 DIAGNOSIS — I63422 Cerebral infarction due to embolism of left anterior cerebral artery: Secondary | ICD-10-CM | POA: Diagnosis not present

## 2022-07-31 DIAGNOSIS — I63422 Cerebral infarction due to embolism of left anterior cerebral artery: Secondary | ICD-10-CM | POA: Diagnosis not present

## 2022-07-31 DIAGNOSIS — R41841 Cognitive communication deficit: Secondary | ICD-10-CM | POA: Diagnosis not present

## 2022-07-31 DIAGNOSIS — R630 Anorexia: Secondary | ICD-10-CM | POA: Diagnosis not present

## 2022-07-31 DIAGNOSIS — F01C11 Vascular dementia, severe, with agitation: Secondary | ICD-10-CM | POA: Diagnosis not present

## 2022-07-31 DIAGNOSIS — I69351 Hemiplegia and hemiparesis following cerebral infarction affecting right dominant side: Secondary | ICD-10-CM | POA: Diagnosis not present

## 2022-07-31 DIAGNOSIS — R4701 Aphasia: Secondary | ICD-10-CM | POA: Diagnosis not present

## 2022-07-31 DIAGNOSIS — R531 Weakness: Secondary | ICD-10-CM | POA: Diagnosis not present

## 2022-07-31 DIAGNOSIS — R051 Acute cough: Secondary | ICD-10-CM | POA: Diagnosis not present

## 2022-07-31 DIAGNOSIS — I69320 Aphasia following cerebral infarction: Secondary | ICD-10-CM | POA: Diagnosis not present

## 2022-07-31 DIAGNOSIS — U071 COVID-19: Secondary | ICD-10-CM | POA: Diagnosis not present

## 2022-08-01 DIAGNOSIS — R41841 Cognitive communication deficit: Secondary | ICD-10-CM | POA: Diagnosis not present

## 2022-08-01 DIAGNOSIS — I69351 Hemiplegia and hemiparesis following cerebral infarction affecting right dominant side: Secondary | ICD-10-CM | POA: Diagnosis not present

## 2022-08-01 DIAGNOSIS — R4701 Aphasia: Secondary | ICD-10-CM | POA: Diagnosis not present

## 2022-08-01 DIAGNOSIS — D6861 Antiphospholipid syndrome: Secondary | ICD-10-CM | POA: Diagnosis not present

## 2022-08-01 DIAGNOSIS — F01C11 Vascular dementia, severe, with agitation: Secondary | ICD-10-CM | POA: Diagnosis not present

## 2022-08-01 DIAGNOSIS — I69320 Aphasia following cerebral infarction: Secondary | ICD-10-CM | POA: Diagnosis not present

## 2022-08-01 DIAGNOSIS — I63422 Cerebral infarction due to embolism of left anterior cerebral artery: Secondary | ICD-10-CM | POA: Diagnosis not present

## 2022-08-01 DIAGNOSIS — G309 Alzheimer's disease, unspecified: Secondary | ICD-10-CM | POA: Diagnosis not present

## 2022-08-01 DIAGNOSIS — N200 Calculus of kidney: Secondary | ICD-10-CM | POA: Diagnosis not present

## 2022-08-01 DIAGNOSIS — I639 Cerebral infarction, unspecified: Secondary | ICD-10-CM | POA: Diagnosis not present

## 2022-08-02 DIAGNOSIS — R41841 Cognitive communication deficit: Secondary | ICD-10-CM | POA: Diagnosis not present

## 2022-08-02 DIAGNOSIS — F01C11 Vascular dementia, severe, with agitation: Secondary | ICD-10-CM | POA: Diagnosis not present

## 2022-08-02 DIAGNOSIS — R4701 Aphasia: Secondary | ICD-10-CM | POA: Diagnosis not present

## 2022-08-02 DIAGNOSIS — I4891 Unspecified atrial fibrillation: Secondary | ICD-10-CM | POA: Diagnosis not present

## 2022-08-02 DIAGNOSIS — I69351 Hemiplegia and hemiparesis following cerebral infarction affecting right dominant side: Secondary | ICD-10-CM | POA: Diagnosis not present

## 2022-08-02 DIAGNOSIS — I69320 Aphasia following cerebral infarction: Secondary | ICD-10-CM | POA: Diagnosis not present

## 2022-08-02 DIAGNOSIS — I63422 Cerebral infarction due to embolism of left anterior cerebral artery: Secondary | ICD-10-CM | POA: Diagnosis not present

## 2022-08-06 DIAGNOSIS — I4891 Unspecified atrial fibrillation: Secondary | ICD-10-CM | POA: Diagnosis not present

## 2022-08-07 DIAGNOSIS — I69398 Other sequelae of cerebral infarction: Secondary | ICD-10-CM | POA: Diagnosis not present

## 2022-08-07 DIAGNOSIS — F331 Major depressive disorder, recurrent, moderate: Secondary | ICD-10-CM | POA: Diagnosis not present

## 2022-08-07 DIAGNOSIS — F01C11 Vascular dementia, severe, with agitation: Secondary | ICD-10-CM | POA: Diagnosis not present

## 2022-08-07 DIAGNOSIS — G4700 Insomnia, unspecified: Secondary | ICD-10-CM | POA: Diagnosis not present

## 2022-08-07 DIAGNOSIS — F01C4 Vascular dementia, severe, with anxiety: Secondary | ICD-10-CM | POA: Diagnosis not present

## 2022-08-09 DIAGNOSIS — I4891 Unspecified atrial fibrillation: Secondary | ICD-10-CM | POA: Diagnosis not present

## 2022-08-09 DIAGNOSIS — U071 COVID-19: Secondary | ICD-10-CM | POA: Diagnosis not present

## 2022-08-09 DIAGNOSIS — R051 Acute cough: Secondary | ICD-10-CM | POA: Diagnosis not present

## 2022-08-09 DIAGNOSIS — I6930 Unspecified sequelae of cerebral infarction: Secondary | ICD-10-CM | POA: Diagnosis not present

## 2022-08-09 DIAGNOSIS — F0390 Unspecified dementia without behavioral disturbance: Secondary | ICD-10-CM | POA: Diagnosis not present

## 2022-08-12 DIAGNOSIS — F0390 Unspecified dementia without behavioral disturbance: Secondary | ICD-10-CM | POA: Diagnosis not present

## 2022-08-12 DIAGNOSIS — R300 Dysuria: Secondary | ICD-10-CM | POA: Diagnosis not present

## 2022-08-12 DIAGNOSIS — I6930 Unspecified sequelae of cerebral infarction: Secondary | ICD-10-CM | POA: Diagnosis not present

## 2022-08-12 DIAGNOSIS — R531 Weakness: Secondary | ICD-10-CM | POA: Diagnosis not present

## 2022-08-13 ENCOUNTER — Telehealth: Payer: Self-pay

## 2022-08-13 DIAGNOSIS — N390 Urinary tract infection, site not specified: Secondary | ICD-10-CM | POA: Diagnosis not present

## 2022-08-13 DIAGNOSIS — I4891 Unspecified atrial fibrillation: Secondary | ICD-10-CM | POA: Diagnosis not present

## 2022-08-13 NOTE — Patient Outreach (Signed)
  Care Coordination   Initial Visit Note   08/13/2022 Name: Raylie Maddison [facility] MRN: [id number] DOB: 1951-04-17  Loran Senters Spirito is a 71 y.o. year old female who sees Juline Patch, MD for primary care. I  Spoke with the patients daughter, Ramiro Harvest  What matters to the patients health and wellness today?  The patients daughter states that she is in LTC facility and has a pcp there. She did want the RNCM to let Dr. Ronnald Ramp know that the patients husband and her dad passed away last week.     Goals Addressed   None    Spoke to [place], the patients daughter. She states the patient is in LTC facitlity and is being followed by the provider there. The patients husband also passed away last week. The daughter wanted to let the RNCM and pcp know. Secure message sent to Dr. Ronnald Ramp.  SDOH assessments and interventions completed:  No     Care Coordination Interventions Activated:  No  Care Coordination Interventions:  No, not indicated   Follow up plan: No further intervention required.   Encounter Outcome:  Pt. Visit Completed   Noreene Larsson RN, MSN, [facility] Mobile: [phone]

## 2022-08-15 DIAGNOSIS — F331 Major depressive disorder, recurrent, moderate: Secondary | ICD-10-CM | POA: Diagnosis not present

## 2022-08-15 DIAGNOSIS — I69398 Other sequelae of cerebral infarction: Secondary | ICD-10-CM | POA: Diagnosis not present

## 2022-08-16 DIAGNOSIS — I4891 Unspecified atrial fibrillation: Secondary | ICD-10-CM | POA: Diagnosis not present

## 2024-04-02 DEATH — deceased
